# Patient Record
Sex: Male | Born: 1945
Health system: Southern US, Community
[De-identification: ages and names within clinical notes are randomized; demographics above are authoritative.]

## PROBLEM LIST (undated history)

## (undated) DIAGNOSIS — D649 Anemia, unspecified: Secondary | ICD-10-CM

## (undated) DIAGNOSIS — N183 Chronic kidney disease, stage 3 unspecified: Secondary | ICD-10-CM

## (undated) DIAGNOSIS — R972 Elevated prostate specific antigen [PSA]: Secondary | ICD-10-CM

## (undated) DIAGNOSIS — Z8619 Personal history of other infectious and parasitic diseases: Secondary | ICD-10-CM

## (undated) DIAGNOSIS — Z8639 Personal history of other endocrine, nutritional and metabolic disease: Secondary | ICD-10-CM

## (undated) DIAGNOSIS — M1990 Unspecified osteoarthritis, unspecified site: Secondary | ICD-10-CM

## (undated) DIAGNOSIS — E785 Hyperlipidemia, unspecified: Secondary | ICD-10-CM

## (undated) DIAGNOSIS — E119 Type 2 diabetes mellitus without complications: Secondary | ICD-10-CM

## (undated) DIAGNOSIS — R351 Nocturia: Secondary | ICD-10-CM

## (undated) DIAGNOSIS — G629 Polyneuropathy, unspecified: Secondary | ICD-10-CM

## (undated) DIAGNOSIS — H269 Unspecified cataract: Secondary | ICD-10-CM

## (undated) DIAGNOSIS — Z794 Long term (current) use of insulin: Secondary | ICD-10-CM

## (undated) DIAGNOSIS — I10 Essential (primary) hypertension: Secondary | ICD-10-CM

## (undated) DIAGNOSIS — C61 Malignant neoplasm of prostate: Secondary | ICD-10-CM

## (undated) HISTORY — DX: Anemia, unspecified: D64.9

## (undated) HISTORY — DX: Unspecified cataract: H26.9

## (undated) HISTORY — DX: Hyperlipidemia, unspecified: E78.5

---

## 1989-03-10 HISTORY — PX: CIRCUMCISION: SHX1350

## 1998-10-19 ENCOUNTER — Emergency Department (HOSPITAL_COMMUNITY): Admission: EM | Admit: 1998-10-19 | Discharge: 1998-10-19 | Payer: Self-pay | Admitting: Emergency Medicine

## 2011-03-24 ENCOUNTER — Emergency Department (HOSPITAL_COMMUNITY): Payer: Self-pay

## 2011-03-24 ENCOUNTER — Emergency Department (HOSPITAL_COMMUNITY)
Admission: EM | Admit: 2011-03-24 | Discharge: 2011-03-25 | Disposition: A | Discharge status: Home / Self Care | Payer: Self-pay | Attending: Emergency Medicine | Admitting: Emergency Medicine

## 2011-03-24 DIAGNOSIS — W19XXXA Unspecified fall, initial encounter: Secondary | ICD-10-CM | POA: Insufficient documentation

## 2011-03-24 DIAGNOSIS — Y92009 Unspecified place in unspecified non-institutional (private) residence as the place of occurrence of the external cause: Secondary | ICD-10-CM | POA: Insufficient documentation

## 2011-03-24 DIAGNOSIS — R51 Headache: Secondary | ICD-10-CM | POA: Insufficient documentation

## 2011-03-24 DIAGNOSIS — S0180XA Unspecified open wound of other part of head, initial encounter: Secondary | ICD-10-CM | POA: Insufficient documentation

## 2011-03-24 DIAGNOSIS — E119 Type 2 diabetes mellitus without complications: Secondary | ICD-10-CM | POA: Insufficient documentation

## 2011-03-24 DIAGNOSIS — F1010 Alcohol abuse, uncomplicated: Secondary | ICD-10-CM | POA: Insufficient documentation

## 2011-03-24 DIAGNOSIS — R4789 Other speech disturbances: Secondary | ICD-10-CM | POA: Insufficient documentation

## 2011-03-24 LAB — DIFFERENTIAL
Basophils Absolute: 0 10*3/uL (ref 0.0–0.1)
Eosinophils Relative: 20 % — ABNORMAL HIGH (ref 0–5)
Lymphocytes Relative: 30 % (ref 12–46)
Lymphs Abs: 1.3 10*3/uL (ref 0.7–4.0)
Monocytes Absolute: 0.3 10*3/uL (ref 0.1–1.0)

## 2011-03-24 LAB — COMPREHENSIVE METABOLIC PANEL
Albumin: 3.2 g/dL — ABNORMAL LOW (ref 3.5–5.2)
BUN: 10 mg/dL (ref 6–23)
Calcium: 9 mg/dL (ref 8.4–10.5)
Creatinine, Ser: 1.42 mg/dL — ABNORMAL HIGH (ref 0.50–1.35)
Total Protein: 8.1 g/dL (ref 6.0–8.3)

## 2011-03-24 LAB — CBC
HCT: 25.9 % — ABNORMAL LOW (ref 39.0–52.0)
MCH: 35.8 pg — ABNORMAL HIGH (ref 26.0–34.0)
MCHC: 36.7 g/dL — ABNORMAL HIGH (ref 30.0–36.0)
MCV: 97.7 fL (ref 78.0–100.0)
RDW: 13 % (ref 11.5–15.5)

## 2011-03-24 LAB — ETHANOL: Alcohol, Ethyl (B): 332 mg/dL — ABNORMAL HIGH (ref 0–11)

## 2011-03-29 ENCOUNTER — Emergency Department (HOSPITAL_COMMUNITY)
Admission: EM | Admit: 2011-03-29 | Discharge: 2011-03-29 | Disposition: A | Discharge status: Home / Self Care | Payer: Self-pay | Attending: Emergency Medicine | Admitting: Emergency Medicine

## 2011-03-29 DIAGNOSIS — Z87891 Personal history of nicotine dependence: Secondary | ICD-10-CM | POA: Insufficient documentation

## 2011-03-29 DIAGNOSIS — Z4802 Encounter for removal of sutures: Secondary | ICD-10-CM | POA: Insufficient documentation

## 2013-07-25 ENCOUNTER — Inpatient Hospital Stay (HOSPITAL_COMMUNITY)
Admission: EM | Admit: 2013-07-25 | Discharge: 2013-07-29 | DRG: 871 | Disposition: A | Discharge status: Skilled Nursing Facility | Payer: Medicare Other | Attending: Internal Medicine | Admitting: Internal Medicine

## 2013-07-25 ENCOUNTER — Emergency Department (HOSPITAL_COMMUNITY): Payer: Medicare Other

## 2013-07-25 ENCOUNTER — Encounter (HOSPITAL_COMMUNITY): Payer: Self-pay | Admitting: Emergency Medicine

## 2013-07-25 DIAGNOSIS — E162 Hypoglycemia, unspecified: Secondary | ICD-10-CM | POA: Diagnosis present

## 2013-07-25 DIAGNOSIS — E872 Acidosis, unspecified: Secondary | ICD-10-CM | POA: Diagnosis not present

## 2013-07-25 DIAGNOSIS — D61818 Other pancytopenia: Secondary | ICD-10-CM | POA: Clinically undetermined

## 2013-07-25 DIAGNOSIS — F10229 Alcohol dependence with intoxication, unspecified: Secondary | ICD-10-CM | POA: Diagnosis present

## 2013-07-25 DIAGNOSIS — T68XXXA Hypothermia, initial encounter: Secondary | ICD-10-CM | POA: Diagnosis not present

## 2013-07-25 DIAGNOSIS — E161 Other hypoglycemia: Secondary | ICD-10-CM | POA: Diagnosis not present

## 2013-07-25 DIAGNOSIS — R680 Hypothermia, not associated with low environmental temperature: Secondary | ICD-10-CM | POA: Diagnosis present

## 2013-07-25 DIAGNOSIS — N179 Acute kidney failure, unspecified: Secondary | ICD-10-CM | POA: Diagnosis not present

## 2013-07-25 DIAGNOSIS — Z79899 Other long term (current) drug therapy: Secondary | ICD-10-CM

## 2013-07-25 DIAGNOSIS — A419 Sepsis, unspecified organism: Secondary | ICD-10-CM | POA: Diagnosis not present

## 2013-07-25 DIAGNOSIS — I10 Essential (primary) hypertension: Secondary | ICD-10-CM | POA: Diagnosis present

## 2013-07-25 DIAGNOSIS — E1169 Type 2 diabetes mellitus with other specified complication: Secondary | ICD-10-CM | POA: Diagnosis present

## 2013-07-25 DIAGNOSIS — E860 Dehydration: Secondary | ICD-10-CM | POA: Diagnosis present

## 2013-07-25 DIAGNOSIS — F1010 Alcohol abuse, uncomplicated: Secondary | ICD-10-CM | POA: Diagnosis present

## 2013-07-25 DIAGNOSIS — E119 Type 2 diabetes mellitus without complications: Secondary | ICD-10-CM | POA: Diagnosis not present

## 2013-07-25 DIAGNOSIS — N189 Chronic kidney disease, unspecified: Secondary | ICD-10-CM | POA: Diagnosis present

## 2013-07-25 DIAGNOSIS — IMO0002 Reserved for concepts with insufficient information to code with codable children: Secondary | ICD-10-CM

## 2013-07-25 DIAGNOSIS — E440 Moderate protein-calorie malnutrition: Secondary | ICD-10-CM | POA: Diagnosis present

## 2013-07-25 DIAGNOSIS — R918 Other nonspecific abnormal finding of lung field: Secondary | ICD-10-CM | POA: Diagnosis not present

## 2013-07-25 DIAGNOSIS — I498 Other specified cardiac arrhythmias: Secondary | ICD-10-CM | POA: Diagnosis present

## 2013-07-25 DIAGNOSIS — G9340 Encephalopathy, unspecified: Secondary | ICD-10-CM | POA: Diagnosis not present

## 2013-07-25 DIAGNOSIS — R7301 Impaired fasting glucose: Secondary | ICD-10-CM | POA: Diagnosis not present

## 2013-07-25 DIAGNOSIS — R6520 Severe sepsis without septic shock: Secondary | ICD-10-CM

## 2013-07-25 DIAGNOSIS — F17200 Nicotine dependence, unspecified, uncomplicated: Secondary | ICD-10-CM | POA: Diagnosis present

## 2013-07-25 DIAGNOSIS — I129 Hypertensive chronic kidney disease with stage 1 through stage 4 chronic kidney disease, or unspecified chronic kidney disease: Secondary | ICD-10-CM | POA: Diagnosis present

## 2013-07-25 DIAGNOSIS — D638 Anemia in other chronic diseases classified elsewhere: Secondary | ICD-10-CM | POA: Diagnosis present

## 2013-07-25 LAB — URINALYSIS, ROUTINE W REFLEX MICROSCOPIC
Bilirubin Urine: NEGATIVE
Glucose, UA: NEGATIVE mg/dL
Ketones, ur: 15 mg/dL — AB
LEUKOCYTES UA: NEGATIVE
NITRITE: NEGATIVE
PH: 5 (ref 5.0–8.0)
Protein, ur: 100 mg/dL — AB
SPECIFIC GRAVITY, URINE: 1.011 (ref 1.005–1.030)
Urobilinogen, UA: 1 mg/dL (ref 0.0–1.0)

## 2013-07-25 LAB — COMPREHENSIVE METABOLIC PANEL
ALBUMIN: 3.3 g/dL — AB (ref 3.5–5.2)
ALT: 32 U/L (ref 0–53)
AST: 41 U/L — ABNORMAL HIGH (ref 0–37)
Alkaline Phosphatase: 47 U/L (ref 39–117)
BUN: 18 mg/dL (ref 6–23)
CALCIUM: 9.1 mg/dL (ref 8.4–10.5)
CO2: 9 mEq/L — CL (ref 19–32)
Chloride: 91 mEq/L — ABNORMAL LOW (ref 96–112)
Creatinine, Ser: 2.04 mg/dL — ABNORMAL HIGH (ref 0.50–1.35)
GFR calc non Af Amer: 33 mL/min — ABNORMAL LOW (ref >90)
GFR, EST AFRICAN AMERICAN: 38 mL/min — AB (ref >90)
GLUCOSE: 162 mg/dL — AB (ref 70–99)
Potassium: 4.5 mEq/L (ref 3.7–5.3)
SODIUM: 134 meq/L — AB (ref 137–147)
TOTAL PROTEIN: 8.3 g/dL (ref 6.0–8.3)
Total Bilirubin: 0.4 mg/dL (ref 0.3–1.2)

## 2013-07-25 LAB — BASIC METABOLIC PANEL
BUN: 17 mg/dL (ref 6–23)
CALCIUM: 8.1 mg/dL — AB (ref 8.4–10.5)
CO2: 8 mEq/L — CL (ref 19–32)
Chloride: 98 mEq/L (ref 96–112)
Creatinine, Ser: 1.66 mg/dL — ABNORMAL HIGH (ref 0.50–1.35)
GFR calc Af Amer: 48 mL/min — ABNORMAL LOW (ref >90)
GFR, EST NON AFRICAN AMERICAN: 42 mL/min — AB (ref >90)
GLUCOSE: 130 mg/dL — AB (ref 70–99)
Potassium: 5.1 mEq/L (ref 3.7–5.3)
Sodium: 134 mEq/L — ABNORMAL LOW (ref 137–147)

## 2013-07-25 LAB — CBC WITH DIFFERENTIAL/PLATELET
Basophils Absolute: 0 K/uL (ref 0.0–0.1)
Basophils Relative: 0 % (ref 0–1)
Eosinophils Absolute: 0 K/uL (ref 0.0–0.7)
Eosinophils Relative: 0 % (ref 0–5)
HCT: 35.5 % — ABNORMAL LOW (ref 39.0–52.0)
Hemoglobin: 12 g/dL — ABNORMAL LOW (ref 13.0–17.0)
Lymphocytes Relative: 13 % (ref 12–46)
Lymphs Abs: 1 K/uL (ref 0.7–4.0)
MCH: 35.7 pg — ABNORMAL HIGH (ref 26.0–34.0)
MCHC: 33.8 g/dL (ref 30.0–36.0)
MCV: 105.7 fL — ABNORMAL HIGH (ref 78.0–100.0)
Monocytes Absolute: 0.4 K/uL (ref 0.1–1.0)
Monocytes Relative: 5 % (ref 3–12)
Neutro Abs: 5.9 K/uL (ref 1.7–7.7)
Neutrophils Relative %: 81 % — ABNORMAL HIGH (ref 43–77)
Platelets: 174 K/uL (ref 150–400)
RBC: 3.36 MIL/uL — ABNORMAL LOW (ref 4.22–5.81)
RDW: 13.7 % (ref 11.5–15.5)
WBC: 7.3 K/uL (ref 4.0–10.5)

## 2013-07-25 LAB — PROTIME-INR
INR: 1.16 (ref 0.00–1.49)
Prothrombin Time: 14.6 seconds (ref 11.6–15.2)

## 2013-07-25 LAB — CBC
HEMATOCRIT: 30.4 % — AB (ref 39.0–52.0)
Hemoglobin: 10.2 g/dL — ABNORMAL LOW (ref 13.0–17.0)
MCH: 35.3 pg — ABNORMAL HIGH (ref 26.0–34.0)
MCHC: 33.6 g/dL (ref 30.0–36.0)
MCV: 105.2 fL — AB (ref 78.0–100.0)
PLATELETS: 141 10*3/uL — AB (ref 150–400)
RBC: 2.89 MIL/uL — ABNORMAL LOW (ref 4.22–5.81)
RDW: 13.3 % (ref 11.5–15.5)
WBC: 5.2 10*3/uL (ref 4.0–10.5)

## 2013-07-25 LAB — GLUCOSE, CAPILLARY
GLUCOSE-CAPILLARY: 155 mg/dL — AB (ref 70–99)
GLUCOSE-CAPILLARY: 148 mg/dL — AB (ref 70–99)
GLUCOSE-CAPILLARY: 145 mg/dL — AB (ref 70–99)
Glucose-Capillary: 129 mg/dL — ABNORMAL HIGH (ref 70–99)

## 2013-07-25 LAB — URINE MICROSCOPIC-ADD ON

## 2013-07-25 LAB — TROPONIN I: Troponin I: <0.3 ng/mL (ref <0.30)

## 2013-07-25 LAB — MRSA PCR SCREENING: MRSA by PCR: NEGATIVE

## 2013-07-25 LAB — MAGNESIUM: Magnesium: 1.9 mg/dL (ref 1.5–2.5)

## 2013-07-25 LAB — APTT: aPTT: 33 seconds (ref 24–37)

## 2013-07-25 LAB — SODIUM, URINE, RANDOM: Sodium, Ur: 101 mEq/L

## 2013-07-25 LAB — CK: Total CK: 68 U/L (ref 7–232)

## 2013-07-25 LAB — ETHANOL: Alcohol, Ethyl (B): 114 mg/dL — ABNORMAL HIGH (ref 0–11)

## 2013-07-25 LAB — CG4 I-STAT (LACTIC ACID)
LACTIC ACID, VENOUS: 9.02 mmol/L — AB (ref 0.5–2.2)
Lactic Acid, Venous: 7.95 mmol/L — ABNORMAL HIGH (ref 0.5–2.2)

## 2013-07-25 LAB — LACTIC ACID, PLASMA: Lactic Acid, Venous: 6 mmol/L — ABNORMAL HIGH (ref 0.5–2.2)

## 2013-07-25 LAB — CREATININE, URINE, RANDOM: Creatinine, Urine: 39.31 mg/dL

## 2013-07-25 LAB — PROCALCITONIN

## 2013-07-25 MED ORDER — LORAZEPAM 2 MG/ML IJ SOLN: 1.0000 mg | Freq: Four times a day (QID) | INTRAMUSCULAR | Status: AC | PRN

## 2013-07-25 MED ORDER — NICOTINE 21 MG/24HR TD PT24
21.0000 mg | MEDICATED_PATCH | Freq: Every day | TRANSDERMAL | Status: DC
  Administered 2013-07-25 – 2013-07-29 (×5): 21 mg via TRANSDERMAL
  Filled 2013-07-25 (×5): qty 1

## 2013-07-25 MED ORDER — LORAZEPAM 1 MG PO TABS
0.0000 mg | ORAL_TABLET | Freq: Four times a day (QID) | ORAL | Status: AC
  Administered 2013-07-25 – 2013-07-27 (×6): 1 mg via ORAL
  Filled 2013-07-25 (×6): qty 1

## 2013-07-25 MED ORDER — ONDANSETRON HCL 4 MG/2ML IJ SOLN: 4.0000 mg | Freq: Four times a day (QID) | INTRAMUSCULAR | Status: DC | PRN

## 2013-07-25 MED ORDER — ONDANSETRON HCL 4 MG PO TABS: 4.0000 mg | ORAL_TABLET | Freq: Four times a day (QID) | ORAL | Status: DC | PRN

## 2013-07-25 MED ORDER — THIAMINE HCL 100 MG/ML IJ SOLN
100.0000 mg | Freq: Every day | INTRAMUSCULAR | Status: DC
  Filled 2013-07-25 (×4): qty 1

## 2013-07-25 MED ORDER — INSULIN ASPART 100 UNIT/ML ~~LOC~~ SOLN
0.0000 [IU] | Freq: Three times a day (TID) | SUBCUTANEOUS | Status: DC
  Administered 2013-07-26: 1 [IU] via SUBCUTANEOUS

## 2013-07-25 MED ORDER — LORAZEPAM 1 MG PO TABS: 1.0000 mg | ORAL_TABLET | Freq: Four times a day (QID) | ORAL | Status: AC | PRN

## 2013-07-25 MED ORDER — PIPERACILLIN-TAZOBACTAM 3.375 G IVPB
3.3750 g | Freq: Three times a day (TID) | INTRAVENOUS | Status: DC
  Administered 2013-07-25 – 2013-07-28 (×8): 3.375 g via INTRAVENOUS
  Filled 2013-07-25 (×13): qty 50

## 2013-07-25 MED ORDER — SODIUM BICARBONATE 8.4 % IV SOLN
INTRAVENOUS | Status: DC
  Administered 2013-07-25 – 2013-07-26 (×2): via INTRAVENOUS
  Filled 2013-07-25 (×5): qty 1000

## 2013-07-25 MED ORDER — FOLIC ACID 1 MG PO TABS
1.0000 mg | ORAL_TABLET | Freq: Every day | ORAL | Status: DC
  Administered 2013-07-25 – 2013-07-29 (×5): 1 mg via ORAL
  Filled 2013-07-25 (×6): qty 1

## 2013-07-25 MED ORDER — SODIUM CHLORIDE 0.9 % IV BOLUS (SEPSIS)
1000.0000 mL | Freq: Once | INTRAVENOUS | Status: AC
  Administered 2013-07-25: 1000 mL via INTRAVENOUS

## 2013-07-25 MED ORDER — ADULT MULTIVITAMIN W/MINERALS CH
1.0000 | ORAL_TABLET | Freq: Every day | ORAL | Status: DC
  Administered 2013-07-25 – 2013-07-29 (×5): 1 via ORAL
  Filled 2013-07-25 (×5): qty 1

## 2013-07-25 MED ORDER — ALUM & MAG HYDROXIDE-SIMETH 200-200-20 MG/5ML PO SUSP: 30.0000 mL | Freq: Four times a day (QID) | ORAL | Status: DC | PRN

## 2013-07-25 MED ORDER — VANCOMYCIN HCL IN DEXTROSE 1-5 GM/200ML-% IV SOLN
1000.0000 mg | Freq: Once | INTRAVENOUS | Status: AC
  Administered 2013-07-25: 1000 mg via INTRAVENOUS
  Filled 2013-07-25: qty 200

## 2013-07-25 MED ORDER — SORBITOL 70 % SOLN
30.0000 mL | Freq: Every day | Status: DC | PRN
  Filled 2013-07-25: qty 30

## 2013-07-25 MED ORDER — SODIUM CHLORIDE 0.9 % IJ SOLN
3.0000 mL | Freq: Two times a day (BID) | INTRAMUSCULAR | Status: DC
  Administered 2013-07-25 – 2013-07-29 (×5): 3 mL via INTRAVENOUS

## 2013-07-25 MED ORDER — VITAMIN B-1 100 MG PO TABS
100.0000 mg | ORAL_TABLET | Freq: Every day | ORAL | Status: DC
  Administered 2013-07-25 – 2013-07-29 (×5): 100 mg via ORAL
  Filled 2013-07-25 (×5): qty 1

## 2013-07-25 MED ORDER — MAGNESIUM CITRATE PO SOLN
1.0000 | Freq: Once | ORAL | Status: AC | PRN
  Filled 2013-07-25: qty 296

## 2013-07-25 MED ORDER — IPRATROPIUM BROMIDE 0.02 % IN SOLN: 0.5000 mg | RESPIRATORY_TRACT | Status: DC | PRN

## 2013-07-25 MED ORDER — LORAZEPAM 1 MG PO TABS: 0.0000 mg | ORAL_TABLET | Freq: Two times a day (BID) | ORAL | Status: DC

## 2013-07-25 MED ORDER — PIPERACILLIN-TAZOBACTAM 3.375 G IVPB 30 MIN
3.3750 g | Freq: Once | INTRAVENOUS | Status: AC
  Administered 2013-07-25: 3.375 g via INTRAVENOUS
  Filled 2013-07-25: qty 50

## 2013-07-25 MED ORDER — ALBUTEROL SULFATE (2.5 MG/3ML) 0.083% IN NEBU: 2.5000 mg | INHALATION_SOLUTION | RESPIRATORY_TRACT | Status: DC | PRN

## 2013-07-25 MED ORDER — ACETAMINOPHEN 325 MG PO TABS
650.0000 mg | ORAL_TABLET | Freq: Four times a day (QID) | ORAL | Status: DC | PRN
  Administered 2013-07-25: 650 mg via ORAL
  Filled 2013-07-25: qty 2

## 2013-07-25 MED ORDER — ONDANSETRON HCL 4 MG/2ML IJ SOLN
4.0000 mg | Freq: Once | INTRAMUSCULAR | Status: AC
Start: 2013-07-25 — End: 2013-07-25
  Administered 2013-07-25: 4 mg via INTRAVENOUS

## 2013-07-25 MED ORDER — ONDANSETRON HCL 4 MG/2ML IJ SOLN: 4.0000 mg | Freq: Once | INTRAMUSCULAR | Status: DC

## 2013-07-25 MED ORDER — POLYETHYLENE GLYCOL 3350 17 G PO PACK
17.0000 g | PACK | Freq: Every day | ORAL | Status: DC | PRN
  Filled 2013-07-25: qty 1

## 2013-07-25 MED ORDER — ONDANSETRON HCL 4 MG/2ML IJ SOLN
INTRAMUSCULAR | Status: AC
  Filled 2013-07-25: qty 2

## 2013-07-25 MED ORDER — SODIUM CHLORIDE 0.9 % IV SOLN
INTRAVENOUS | Status: DC
  Administered 2013-07-26: 10:00:00 via INTRAVENOUS

## 2013-07-25 MED ORDER — SODIUM CHLORIDE 0.9 % IV BOLUS (SEPSIS)
1000.0000 mL | Freq: Once | INTRAVENOUS | Status: AC
Start: 2013-07-25 — End: 2013-07-25
  Administered 2013-07-25: 1000 mL via INTRAVENOUS

## 2013-07-25 MED ORDER — SODIUM CHLORIDE 0.9 % IV SOLN
INTRAVENOUS | Status: AC
  Administered 2013-07-25: 16:00:00 via INTRAVENOUS
  Administered 2013-07-25: 150 mL via INTRAVENOUS

## 2013-07-25 MED ORDER — ENOXAPARIN SODIUM 40 MG/0.4ML ~~LOC~~ SOLN
40.0000 mg | Freq: Every day | SUBCUTANEOUS | Status: DC
  Administered 2013-07-25: 40 mg via SUBCUTANEOUS
  Filled 2013-07-25 (×2): qty 0.4

## 2013-07-25 MED ORDER — ACETAMINOPHEN 650 MG RE SUPP
650.0000 mg | Freq: Four times a day (QID) | RECTAL | Status: DC | PRN
Start: 2013-07-25 — End: 2013-07-29

## 2013-07-25 MED ORDER — PANTOPRAZOLE SODIUM 40 MG PO TBEC
40.0000 mg | DELAYED_RELEASE_TABLET | Freq: Every day | ORAL | Status: DC
Start: 2013-07-26 — End: 2013-07-29
  Administered 2013-07-26 – 2013-07-29 (×4): 40 mg via ORAL
  Filled 2013-07-25 (×4): qty 1

## 2013-07-25 MED ORDER — VANCOMYCIN HCL IN DEXTROSE 1-5 GM/200ML-% IV SOLN
1000.0000 mg | INTRAVENOUS | Status: DC
  Administered 2013-07-26: 1000 mg via INTRAVENOUS
  Filled 2013-07-25 (×2): qty 200

## 2013-07-25 MED ORDER — SODIUM CHLORIDE 0.9 % IV BOLUS (SEPSIS): 1000.0000 mL | Freq: Once | INTRAVENOUS | Status: DC

## 2013-07-25 NOTE — ED Notes (Signed)
Attempted report 

## 2013-07-25 NOTE — ED Notes (Signed)
Dr. Regenia Skeeter made aware pt'd family at bedside. Dr. Regenia Skeeter also declines ordering ABX at this times, states wants repeat CG4 I-stat Lactic Acid s/p 2058mL NS.

## 2013-07-25 NOTE — ED Notes (Signed)
Patient unable to void 

## 2013-07-25 NOTE — ED Notes (Signed)
Dr. Thompson at bedside. 

## 2013-07-25 NOTE — ED Notes (Signed)
Attempted to do in and out cath but cath would not advance. Readjusted and cath will still not advance. MD and RN made aware.

## 2013-07-25 NOTE — ED Notes (Signed)
Level II Code Sepsis called.

## 2013-07-25 NOTE — ED Notes (Signed)
Dr. Regenia Skeeter alerted to pt temperature and at bedside at this time.

## 2013-07-25 NOTE — Progress Notes (Signed)
Patient received to room 3's bed 12 from ED. Patient is alert and oriented x's. Patient slide self over into our bed. Vitals obtained, central telemetry and E-link made aware of the patient's arrival. Patient instructed as to call out for assistance and not to get out of the bed by himself.

## 2013-07-25 NOTE — ED Notes (Signed)
Phlebotomy at bedside.

## 2013-07-25 NOTE — ED Notes (Signed)
Family at bedside. Dema Severin [phone] or [phone].

## 2013-07-25 NOTE — H&P (Signed)
[facility] History and Physical  Searcy Miyoshi Deprey VEL:[id number] DOB: [birth year] DOA: 07/25/2013  Referring physician: Dr. Regenia Skeeter PCP: No PCP Per Patient   Chief Complaint: Unresponsiveness  HPI: Robert Sparks is a 68 y.o. male  With history of tobacco abuse, prior history of diabetes per patient has been off medications for several years, history of alcohol abuse presented to the ED unresponsive. Patient is not quite sure what happened however states that was drinking the night prior to admission he drank about 2-340 ounce beers and drank close to half a pint of vodka mixed with water. Patient stated that his house mate found him unresponsive and subsequently EMS was called. Per ED report when EMS got that patient's CBGs were 20 was given some D50 and glucagon and his mental status improved and return back to his baseline. Patient denies any fevers, no nausea, no vomiting, no abdominal pain, no chest pain, no shortness of breath, no dysuria, no diarrhea, no cough. Patient does endorse some chills, generalized weakness, occasional constipation. Patient was seen in the emergency room chest x-ray which was done was unremarkable. Vitals on admission patient was noted to be hypothermic with a temperature of 91. Comprehensive metabolic profile obtained at a sodium of 134 chloride of 91 bicarbonate of 9 creatinine of 2.04 glucose of 162 albumin of 3.3 AST of 41 otherwise was within normal limits. Initial lactic acid level was 9.0 to and then 7.95. Total CK was 68. CBC had a hemoglobin of 12.0 otherwise was within normal limits. Urinalysis which was done was cloudy nitrite negative leukocytes negative. Alcohol level was 114. Patient was given 3 L of normal saline. Blood cultures were obtained. Patient was placed on IV vancomycin IV Zosyn. We were called to admit the patient for further evaluation and management.   Review of Systems: As per history of present illness otherwise  negative. Constitutional:  No weight loss, night sweats, Fevers, chills, fatigue.  HEENT:  No headaches, Difficulty swallowing,Tooth/dental problems,Sore throat,  No sneezing, itching, ear ache, nasal congestion, post nasal drip,  Cardio-vascular:  No chest pain, Orthopnea, PND, swelling in lower extremities, anasarca, dizziness, palpitations  GI:  No heartburn, indigestion, abdominal pain, nausea, vomiting, diarrhea, change in bowel habits, loss of appetite  Resp:  No shortness of breath with exertion or at rest. No excess mucus, no productive cough, No non-productive cough, No coughing up of blood.No change in color of mucus.No wheezing.No chest wall deformity  Skin:  no rash or lesions.  GU:  no dysuria, change in color of urine, no urgency or frequency. No flank pain.  Musculoskeletal:  No joint pain or swelling. No decreased range of motion. No back pain.  Psych:  No change in mood or affect. No depression or anxiety. No memory loss.   Past Medical History  Diagnosis Date  . Diabetes mellitus without complication   . ETOH abuse    History reviewed. No pertinent past surgical history. Social History:  reports that he has been smoking Cigarettes.  He has been smoking about 0.50 packs per day. He does not have any smokeless tobacco history on file. He reports that he drinks alcohol. His drug history is not on file.  No Known Allergies  History reviewed. No pertinent family history.   Prior to Admission medications   Not on File   Physical Exam: Filed Vitals:   07/25/13 1545  BP: 145/76  Pulse: 104  Temp:   Resp: 26    BP 145/76  Pulse 104  Temp(Src) 95.9 F (35.5 C) (Rectal)  Resp 26  Ht 5\' 7"  (1.702 m)  Wt 68.04 kg (150 lb)  BMI 23.49 kg/m2  SpO2 100%  General:  Appears calm and comfortable. In no acute cardiopulmonary distress. Eyes: Extraocular movements intact. Muddy sclera. PERRLA, normal lids, irises & conjunctiva ENT: grossly normal hearing, lips &  tongue. Poor dentition. Neck: no LAD, masses or thyromegaly Cardiovascular: Tachycardic, no m/r/g. No LE edema. Telemetry: Sinus tachycardia Respiratory: CTA bilaterally, no w/r/r. Normal respiratory effort. Abdomen: soft, ntnd, positive bowel sounds. Skin: no rash or induration seen on limited exam Musculoskeletal: grossly normal tone BUE/BLE Psychiatric: grossly normal mood and affect, speech fluent and appropriate Neurologic: Alert and oriented x3. Cranial nerves II through XII are grossly intact. No focal deficits. Sensation is intact. Visual fields are intact.          Labs on Admission:  Basic Metabolic Panel:  Recent Labs Lab 07/25/13 1248  NA 134*  K 4.5  CL 91*  CO2 9*  GLUCOSE 162*  BUN 18  CREATININE 2.04*  CALCIUM 9.1   Liver Function Tests:  Recent Labs Lab 07/25/13 1248  AST 41*  ALT 32  ALKPHOS 47  BILITOT 0.4  PROT 8.3  ALBUMIN 3.3*   No results found for this basename: LIPASE, AMYLASE,  in the last 168 hours No results found for this basename: AMMONIA,  in the last 168 hours CBC:  Recent Labs Lab 07/25/13 1248  WBC 7.3  NEUTROABS 5.9  HGB 12.0*  HCT 35.5*  MCV 105.7*  PLT 174   Cardiac Enzymes:  Recent Labs Lab 07/25/13 1524  CKTOTAL 68    BNP (last 3 results) No results found for this basename: PROBNP,  in the last 8760 hours CBG:  Recent Labs Lab 07/25/13 1237 07/25/13 1352 07/25/13 1541  GLUCAP 155* 148* 145*    Radiological Exams on Admission: Dg Chest Portable 1 View  07/25/2013   CLINICAL DATA:  Hypoglycemia  EXAM: PORTABLE CHEST - 1 VIEW  COMPARISON:  None.  FINDINGS: The heart size and mediastinal contours are within normal limits. Both lungs are clear. The visualized skeletal structures are unremarkable.  IMPRESSION: No active disease.   Electronically Signed   By: Franchot Gallo M.D.   On: 07/25/2013 13:29    EKG: Independently reviewed. NSR  Assessment/Plan Principal Problem:   Sepsis Active Problems:    Hypoglycemia   Acute encephalopathy   Hypothermia   ETOH abuse   Metabolic acidosis   ARF (acute renal failure)  #1 sepsis Questionable etiology. Patient noted to be hypothermic on admission with elevated lactic acid level of 9.0 to. Normal white count. The sinus tachycardia. An increased respiratory rate. Bicarbonate level noted to be at 9. Admit the patient to step down unit. We'll place on a bear hugger. Check blood cultures x2. Check a pro calcitonin level. Urinalysis is negative. Patient with no respiratory symptoms. We'll place empirically on IV vancomycin and IV Zosyn. We'll consult critical care medicine for further evaluation and management.  #2 hypothermia Likely secondary to hypoglycemia versus an infectious etiology. Patient noted to have a CBG of 20 when EMS arrived. Patient was given D50 and glucagon with improvement in his CBGs. Patient has been pancultured. Lactic acid level is elevated. Check a pro calcitonin level. Continue bear hugger. Placed empirically on IV vancomycin and IV Zosyn. Follow.  #3 hypoglycemia Questionable etiology. May be secondary to poor oral intake of alcohol use. Patient states she's not on any diabetic medications  and hasn't been on any for several years. Will check her and cortisol level. Panculture. Check CBGs every 4 hours. Place on IV fluids. Placed empirically on IV vancomycin IV Zosyn. Follow.  #4 acute renal failure Likely secondary to prerenal azotemia. Will check a urine sodium. Check a urine creatinine. Check an abdominal ultrasound. Hydrate with IV fluids. Follow. If no significant improvement in patient's renal function will likely need a nephrology consultation.  #5 metabolic acidosis Questionable etiology. Likely secondary to sepsis secondary to an infectious etiology. Lactic acid level is elevated. Check a pro calcitonin level. Panculture the patient. Will add sodium bicarbonate to IV fluids. Placed empirically on IV vancomycin IV Zosyn.  Follow.  #6 history of alcohol abuse Place on Ativan CIWA protocol. Alcohol cessation. Place on thiamine, folic acid, multivitamin.  #7 tobacco abuse Tobacco cessation. Place on a nicotine patch.  #8 dehydration IV fluids.  #9 history of diabetes Check a hemoglobin A1c. Check CBGs every 4 hours. Place on a Carb modified diet. Place on a sensitive sliding scale insulin.  #10 acute encephalopathy Likely secondary to hypoglycemic episode. Patient has improved and back to his baseline. Patient has been pancultured. Patient with no focal neurological deficits. Placed empirically on IV vancomycin and IV Zosyn.  #11 prophylaxis PPI for GI prophylaxis. Lovenox for DVT prophylaxis.  Code Status: Full Family Communication: Updated patient no family present. Disposition Plan: Admit to step down unit.  Time spent: 70 mins  [facility] Pager [phone]

## 2013-07-25 NOTE — ED Provider Notes (Signed)
CSN: [id number]     Arrival date & time 07/25/13  1231 History   First MD Initiated Contact with Patient 07/25/13 1238     Chief Complaint  Patient presents with  . Hypoglycemia   (Consider location/radiation/quality/duration/timing/severity/associated sxs/prior Treatment) HPI Comments: 68 year old male presents by EMS having been found unresponsive by family. This occurred about an hour ago. He was last seen normal last night about 7 PM. The rest of his glucose to be 20 he is given D50 and glucagon. His mental status returned to normal after this. The patient states he drank last night but states he drank a few beers and did not seem to indicate that it was a lot. He denies any fevers, chills, shortness of breath, or urinary symptoms. No abdominal pain. He has had a little bit of a cough. He is not on any medications for diabetes but doesn't nausea he has diabetes.   Past Medical History  Diagnosis Date  . Diabetes mellitus without complication   . ETOH abuse    History reviewed. No pertinent past surgical history. No family history on file. History  Substance Use Topics  . Smoking status: Not on file  . Smokeless tobacco: Not on file  . Alcohol Use: Yes    Review of Systems  Constitutional: Negative for fever and chills.  Respiratory: Positive for cough. Negative for shortness of breath.   Cardiovascular: Negative for chest pain.  Gastrointestinal: Negative for nausea, vomiting, abdominal pain and diarrhea.  Genitourinary: Negative for dysuria.  Musculoskeletal: Negative for neck stiffness.  Neurological: Negative for headaches.  Psychiatric/Behavioral: Positive for confusion.  All other systems reviewed and are negative.    Allergies  Review of patient's allergies indicates not on file.  Home Medications  No current outpatient prescriptions on file. BP 146/78  Pulse 81  Temp(Src) 91 F (32.8 C) (Rectal)  Resp 18  SpO2 100% Physical Exam  Nursing note and vitals  reviewed. Constitutional: He is oriented to person, place, and time. He appears well-developed and well-nourished. No distress.  HENT:  Head: Normocephalic and atraumatic.  Right Ear: External ear normal.  Left Ear: External ear normal.  Nose: Nose normal.  Eyes: EOM are normal. Pupils are equal, round, and reactive to light. Right eye exhibits no discharge. Left eye exhibits no discharge.  Neck: Normal range of motion. Neck supple.  Cardiovascular: Normal rate, regular rhythm, normal heart sounds and intact distal pulses.   Pulmonary/Chest: Effort normal and breath sounds normal.  Abdominal: Soft. He exhibits no distension. There is no tenderness.  Musculoskeletal: He exhibits no edema.  Neurological: He is alert and oriented to person, place, and time.  Knows it is January 2015 but is slightly off on the date and the day of the week (thursday instead of Friday). 5/5 strength in all 4 extremities. CN 2-12 grossly intact.  Skin: Skin is warm and dry.    ED Course  Procedures (including critical care time) Labs Review Labs Reviewed  CBC WITH DIFFERENTIAL - Abnormal; Notable for the following:    RBC 3.36 (*)    Hemoglobin 12.0 (*)    HCT 35.5 (*)    MCV 105.7 (*)    MCH 35.7 (*)    Neutrophils Relative % 81 (*)    All other components within normal limits  COMPREHENSIVE METABOLIC PANEL - Abnormal; Notable for the following:    Sodium 134 (*)    Chloride 91 (*)    CO2 9 (*)    Glucose, Bld 162 (*)  Creatinine, Ser 2.04 (*)    Albumin 3.3 (*)    AST 41 (*)    GFR calc non Af Amer 33 (*)    GFR calc Af Amer 38 (*)    All other components within normal limits  ETHANOL - Abnormal; Notable for the following:    Alcohol, Ethyl (B) 114 (*)    All other components within normal limits  GLUCOSE, CAPILLARY - Abnormal; Notable for the following:    Glucose-Capillary 155 (*)    All other components within normal limits  GLUCOSE, CAPILLARY - Abnormal; Notable for the following:     Glucose-Capillary 148 (*)    All other components within normal limits  CG4 I-STAT (LACTIC ACID) - Abnormal; Notable for the following:    Lactic Acid, Venous 9.02 (*)    All other components within normal limits  CG4 I-STAT (LACTIC ACID) - Abnormal; Notable for the following:    Lactic Acid, Venous 7.95 (*)    All other components within normal limits  CULTURE, BLOOD (ROUTINE X 2)  CULTURE, BLOOD (ROUTINE X 2)  URINALYSIS, ROUTINE W REFLEX MICROSCOPIC   Imaging Review Dg Chest Portable 1 View  07/25/2013   CLINICAL DATA:  Hypoglycemia  EXAM: PORTABLE CHEST - 1 VIEW  COMPARISON:  None.  FINDINGS: The heart size and mediastinal contours are within normal limits. Both lungs are clear. The visualized skeletal structures are unremarkable.  IMPRESSION: No active disease.   Electronically Signed   By: Franchot Gallo M.D.   On: 07/25/2013 13:29    EKG Interpretation   None       MDM   1. Hypoglycemia   2. Lactic acidosis   3. Hypothermia    Given his hypothermia and elevated lactic acid, a code sepsis was called. Patient has no obvious source of infection. No headache, current AMS or neck pain to suggest CNS infection. Warmed up with bahr hugger, and given warm fluids. No more hypoglycemia here. Given he did not take any meds, will assume sepsis and cover broadly. Will admit to stepdown. He otherwise appears well and has normal vitals besides temperature.    Ephraim Hamburger, MD 07/25/13 [phone]

## 2013-07-25 NOTE — ED Notes (Addendum)
Pt presents from home via [facility] with c/o hypoglycemia. Pt was found unresponsive by family this morning at 1130 (LSN 1900 last night).  EMS found CBG to be 20. Pt was given 1mg  of glucagon and an amp of D50. Pt is now alert and oriented x4 in NAD. Pt reports hx diabetes and ETOH abuse

## 2013-07-25 NOTE — ED Notes (Addendum)
Critical Low CO2: 9.0 Dr. Regenia Skeeter made aware.

## 2013-07-25 NOTE — Progress Notes (Signed)
ANTIBIOTIC CONSULT NOTE - INITIAL  Pharmacy Consult for vancomycin and zosyn Indication: rule out sepsis  No Known Allergies  Patient Measurements: Height: 5\' 7"  (170.2 cm) Weight: 150 lb (68.04 kg) (estimate) IBW/kg (Calculated) : 66.1   Vital Signs: Temp: 95.9 F (35.5 C) (01/16 1543) Temp src: Rectal (01/16 1543) BP: 148/82 mmHg (01/16 1645) Pulse Rate: 105 (01/16 1645) Intake/Output from previous day:   Intake/Output from this shift: Total I/O In: -  Out: 1000 [Urine:700; Emesis/NG output:300]  Labs:  Recent Labs  07/25/13 1248  WBC 7.3  HGB 12.0*  PLT 174  CREATININE 2.04*   Estimated Creatinine Clearance: 33.8 ml/min (by C-G formula based on Cr of 2.04). No results found for this basename: VANCOTROUGH, VANCOPEAK, VANCORANDOM, GENTTROUGH, GENTPEAK, GENTRANDOM, TOBRATROUGH, TOBRAPEAK, TOBRARND, AMIKACINPEAK, AMIKACINTROU, AMIKACIN,  in the last 72 hours   Microbiology: No results found for this or any previous visit (from the past 720 hour(s)).  Medical History: Past Medical History  Diagnosis Date  . Diabetes mellitus without complication   . ETOH abuse    Assessment: 68 year old male found unresponsive by EMS today with CBG of 20, patient states he does not know what happened and endorsed fairly heavy drinking the night before. Patient currently hypothermic with elevated lactic acid, pct undetectable, wbc count normal. He has been started on vancomycin and zosyn for possible sepsis.   Goal of Therapy:  Vancomycin trough level 15-20 mcg/ml  Plan:  Measure antibiotic drug levels at steady state Follow up culture results Vancomycin 1g IV q 24 hours Zosyn 3.375g IV q8 hours - infuse each dose over 4 hours  Erin Hearing PharmD., BCPS Clinical Pharmacist Pager [phone] 07/25/2013 5:09 PM

## 2013-07-25 NOTE — ED Notes (Signed)
Attempted to call pt's emergency contact x2, no answer.

## 2013-07-25 NOTE — ED Notes (Signed)
Patient given [place] sandwich, applesauce, and orange juice per Dr. Tyron Russell request.

## 2013-07-25 NOTE — ED Notes (Signed)
LEVEL II CODE SEPSIS CALLED

## 2013-07-25 NOTE — ED Notes (Signed)
Bear Hugger warmer applied to patient.

## 2013-07-25 NOTE — Consult Note (Addendum)
PULMONARY/CRITICAL CARE MEDICINE  Name: Robert Sparks MRN: [id number] DOB: Jun 18, 1946    ADMISSION DATE:  07/25/2013 CONSULTATION DATE:  07/25/13  REFERRING MD :  Grandville Silos PRIMARY SERVICE: [facility]  CHIEF COMPLAINT:  Unresponsive  BRIEF PATIENT DESCRIPTION: Robert Sparks is a 68 yo male with PMH of DM, ETOH abuse who was found at home unresponsive and hypoglycemic by EMS.  SIGNIFICANT EVENTS / STUDIES:    LINES / TUBES: Right PIV 1/16 >>>  CULTURES: Urine 1/16 >>> Blood 1/16 >>>  ANTIBIOTICS: Vancomycin 1/16 >>> Zosyn 1/16 >>>>  HISTORY OF PRESENT ILLNESS:  Robert Sparks is a 68 year old male with history of DM and ETOH abuse.  He reports he was at home today when he blacked out, his roommate called EMS and he was found to be unresponsive with a blood glucose of 20.  In the ED he was found to be hypothermic and with a lactic acidosis. He was resuscitated with IVF and empiric antibiotics were started.  PCCM was consulted to assist with management.  PAST MEDICAL HISTORY :  Past Medical History  Diagnosis Date  . Diabetes mellitus without complication   . ETOH abuse    History reviewed. No pertinent past surgical history. Prior to Admission medications   Not on File   No Known Allergies  FAMILY HISTORY:  History reviewed. No pertinent family history. SOCIAL HISTORY:  reports that he has been smoking Cigarettes.  He has been smoking about 0.50 packs per day. He does not have any smokeless tobacco history on file. He reports that he drinks alcohol. His drug history is not on file.  REVIEW OF SYSTEMS:   Review of Systems  Constitutional: Negative for fever, chills, weight loss and malaise/fatigue.  HENT: Negative for congestion.   Eyes: Negative for blurred vision and photophobia.  Respiratory: Positive for cough (mild chronic). Negative for hemoptysis, sputum production and shortness of breath.   Cardiovascular: Negative for chest pain and leg swelling.   Gastrointestinal: Positive for nausea (once after arrival to ED). Negative for heartburn, abdominal pain, diarrhea and constipation.  Genitourinary: Negative for dysuria, frequency and flank pain.  Musculoskeletal: Negative for neck pain.  Skin: Negative for rash.  Neurological: Positive for loss of consciousness and headaches. Negative for dizziness, sensory change and seizures.  Psychiatric/Behavioral: Positive for substance abuse. The patient is not nervous/anxious.      SUBJECTIVE: Reports he feels "OK," is concerned that he will have to start insulin.  VITAL SIGNS: Temp:  [91 F (32.8 C)-98.4 F (36.9 C)] 98.4 F (36.9 C) (01/16 1700) Pulse Rate:  [81-109] 109 (01/16 1700) Resp:  [17-34] 24 (01/16 1700) BP: (140-159)/(74-93) 159/93 mmHg (01/16 1700) SpO2:  [98 %-100 %] 100 % (01/16 1700) Weight:  [150 lb (68.04 kg)] 150 lb (68.04 kg) (01/16 1700) HEMODYNAMICS:   VENTILATOR SETTINGS:   INTAKE / OUTPUT: Intake/Output     01/15 0701 - 01/16 0700 01/16 0701 - 01/17 0700   Urine (mL/kg/hr)  700   Emesis/NG output  300   Total Output   1000   Net   -1000          PHYSICAL EXAMINATION: General:  Resting in bed, NAD Neuro:  AAOx4, movement of all extremities HEENT:  [place]/AT PERRLA Cardiovascular:  Tachycardic, no appreciated murmur Lungs:  CTA b/l Abdomen:  Normoactive BS, Non distended, non tender. Musculoskeletal:  No pedal edema Skin:  Warm/ Dry  LABS:  CBC  Recent Labs Lab 07/25/13 1248  WBC 7.3  HGB 12.0*  HCT 35.5*  PLT 174   Coag's No results found for this basename: APTT, INR,  in the last 168 hours BMET  Recent Labs Lab 07/25/13 1248  NA 134*  K 4.5  CL 91*  CO2 9*  BUN 18  CREATININE 2.04*  GLUCOSE 162*   Electrolytes  Recent Labs Lab 07/25/13 1248  CALCIUM 9.1   Sepsis Markers  Recent Labs Lab 07/25/13 1300 07/25/13 1424 07/25/13 1524  LATICACIDVEN 9.02* 7.95*  --   PROCALCITON  --   --  <0.10   ABG No results found for  this basename: PHART, PCO2ART, PO2ART,  in the last 168 hours Liver Enzymes  Recent Labs Lab 07/25/13 1248  AST 41*  ALT 32  ALKPHOS 47  BILITOT 0.4  ALBUMIN 3.3*   Cardiac Enzymes No results found for this basename: TROPONINI, PROBNP,  in the last 168 hours Glucose  Recent Labs Lab 07/25/13 1237 07/25/13 1352 07/25/13 1541  GLUCAP 155* 148* 145*    CXR: 1/16 >>> No pulmonary infiltrate appreciated.  ASSESSMENT / PLAN:  PULMONARY A: No active issues P:   Goal O2 Sat >92 Obtain ABG  CARDIOVASCULAR A:  - Hemodynamically stable - Lactic acidosis of unclear etiology. Alcohol intoxication and starvation likely contributing. May have some liver dysfunction and not be clearing his lactate efficiently. P:  IVF Trend lactate EKG Check Trop  RENAL A:   AKI vs CKD P:   - Will get repeat BMP and LFT's - Will get VBG - Will continue IVF resuscitation  GASTROINTESTINAL A:   Moderate Malnutrition P:   Advance diet as tolerated  HEMATOLOGIC A:   Macrocytic Anemia DVT PPx P:  Folic acid Lovenox  INFECTIOUS A:   R/o sepsis, procalcitonin reassuring P:   Abx/ Cx Check HIV   ENDOCRINE A:   DM  Hypoglycemia P:   A1c CBG SSI Check cortisol, TSH  NEUROLOGIC A:   ETOH abuse P:   CIWA/ Ativan  Joni Reining, DO PGY-1 07/25/2013, 5:36 PM  I have personally obtained a history, examined the patient, evaluated laboratory and imaging results. I discussed with the resident and agree with the plan stated above.   Critical Care Time devoted to patient care services described in this note is 1 hour   Waynetta Pean, MD [place]

## 2013-07-25 NOTE — ED Notes (Signed)
Lactic acid shown to Dr Regenia Skeeter and Raquel Sarna, RN

## 2013-07-26 ENCOUNTER — Inpatient Hospital Stay (HOSPITAL_COMMUNITY): Payer: Medicare Other

## 2013-07-26 ENCOUNTER — Encounter (HOSPITAL_COMMUNITY): Payer: Self-pay | Admitting: Intensive Care

## 2013-07-26 DIAGNOSIS — N179 Acute kidney failure, unspecified: Secondary | ICD-10-CM | POA: Diagnosis not present

## 2013-07-26 DIAGNOSIS — D61818 Other pancytopenia: Secondary | ICD-10-CM | POA: Clinically undetermined

## 2013-07-26 DIAGNOSIS — G9340 Encephalopathy, unspecified: Secondary | ICD-10-CM | POA: Diagnosis not present

## 2013-07-26 DIAGNOSIS — D638 Anemia in other chronic diseases classified elsewhere: Secondary | ICD-10-CM | POA: Diagnosis present

## 2013-07-26 DIAGNOSIS — A419 Sepsis, unspecified organism: Secondary | ICD-10-CM | POA: Diagnosis not present

## 2013-07-26 DIAGNOSIS — T68XXXA Hypothermia, initial encounter: Secondary | ICD-10-CM | POA: Diagnosis not present

## 2013-07-26 DIAGNOSIS — E162 Hypoglycemia, unspecified: Secondary | ICD-10-CM | POA: Diagnosis not present

## 2013-07-26 LAB — VITAMIN B12: Vitamin B-12: 717 pg/mL (ref 211–911)

## 2013-07-26 LAB — COMPREHENSIVE METABOLIC PANEL
ALBUMIN: 2.9 g/dL — AB (ref 3.5–5.2)
ALBUMIN: 2.8 g/dL — AB (ref 3.5–5.2)
ALK PHOS: 37 U/L — AB (ref 39–117)
ALT: 24 U/L (ref 0–53)
ALT: 23 U/L (ref 0–53)
AST: 30 U/L (ref 0–37)
AST: 31 U/L (ref 0–37)
Alkaline Phosphatase: 35 U/L — ABNORMAL LOW (ref 39–117)
BILIRUBIN TOTAL: 0.5 mg/dL (ref 0.3–1.2)
BUN: 18 mg/dL (ref 6–23)
BUN: 17 mg/dL (ref 6–23)
CALCIUM: 8.1 mg/dL — AB (ref 8.4–10.5)
CO2: 17 mEq/L — ABNORMAL LOW (ref 19–32)
CO2: 22 mEq/L (ref 19–32)
CREATININE: 1.68 mg/dL — AB (ref 0.50–1.35)
Calcium: 8 mg/dL — ABNORMAL LOW (ref 8.4–10.5)
Chloride: 97 mEq/L (ref 96–112)
Chloride: 96 mEq/L (ref 96–112)
Creatinine, Ser: 1.71 mg/dL — ABNORMAL HIGH (ref 0.50–1.35)
GFR calc Af Amer: 47 mL/min — ABNORMAL LOW (ref >90)
GFR calc Af Amer: 48 mL/min — ABNORMAL LOW (ref >90)
GFR, EST NON AFRICAN AMERICAN: 40 mL/min — AB (ref >90)
GFR, EST NON AFRICAN AMERICAN: 41 mL/min — AB (ref >90)
GLUCOSE: 140 mg/dL — AB (ref 70–99)
Glucose, Bld: 153 mg/dL — ABNORMAL HIGH (ref 70–99)
Potassium: 5 mEq/L (ref 3.7–5.3)
Potassium: 4.3 mEq/L (ref 3.7–5.3)
SODIUM: 133 meq/L — AB (ref 137–147)
SODIUM: 135 meq/L — AB (ref 137–147)
TOTAL PROTEIN: 7.1 g/dL (ref 6.0–8.3)
Total Bilirubin: 0.6 mg/dL (ref 0.3–1.2)
Total Protein: 7.1 g/dL (ref 6.0–8.3)

## 2013-07-26 LAB — CBC
HEMATOCRIT: 29.6 % — AB (ref 39.0–52.0)
HEMOGLOBIN: 10.5 g/dL — AB (ref 13.0–17.0)
MCH: 35.6 pg — AB (ref 26.0–34.0)
MCHC: 35.5 g/dL (ref 30.0–36.0)
MCV: 100.3 fL — ABNORMAL HIGH (ref 78.0–100.0)
Platelets: 135 10*3/uL — ABNORMAL LOW (ref 150–400)
RBC: 2.95 MIL/uL — AB (ref 4.22–5.81)
RDW: 13 % (ref 11.5–15.5)
WBC: 3.9 10*3/uL — ABNORMAL LOW (ref 4.0–10.5)

## 2013-07-26 LAB — GLUCOSE, CAPILLARY
GLUCOSE-CAPILLARY: 133 mg/dL — AB (ref 70–99)
GLUCOSE-CAPILLARY: 125 mg/dL — AB (ref 70–99)
Glucose-Capillary: 137 mg/dL — ABNORMAL HIGH (ref 70–99)
Glucose-Capillary: 188 mg/dL — ABNORMAL HIGH (ref 70–99)
Glucose-Capillary: 209 mg/dL — ABNORMAL HIGH (ref 70–99)
Glucose-Capillary: 232 mg/dL — ABNORMAL HIGH (ref 70–99)

## 2013-07-26 LAB — LACTIC ACID, PLASMA: Lactic Acid, Venous: 1.2 mmol/L (ref 0.5–2.2)

## 2013-07-26 LAB — IRON AND TIBC
Iron: 82 ug/dL (ref 42–135)
Saturation Ratios: 39 % (ref 20–55)
TIBC: 212 ug/dL — ABNORMAL LOW (ref 215–435)
UIBC: 130 ug/dL (ref 125–400)

## 2013-07-26 LAB — BASIC METABOLIC PANEL
BUN: 16 mg/dL (ref 6–23)
CALCIUM: 8.3 mg/dL — AB (ref 8.4–10.5)
CO2: 18 mEq/L — ABNORMAL LOW (ref 19–32)
Chloride: 94 mEq/L — ABNORMAL LOW (ref 96–112)
Creatinine, Ser: 1.75 mg/dL — ABNORMAL HIGH (ref 0.50–1.35)
GFR calc Af Amer: 45 mL/min — ABNORMAL LOW (ref >90)
GFR calc non Af Amer: 39 mL/min — ABNORMAL LOW (ref >90)
Glucose, Bld: 172 mg/dL — ABNORMAL HIGH (ref 70–99)
Potassium: 3.9 mEq/L (ref 3.7–5.3)
SODIUM: 135 meq/L — AB (ref 137–147)

## 2013-07-26 LAB — CORTISOL: CORTISOL PLASMA: 18.2 ug/dL

## 2013-07-26 LAB — URINE CULTURE
COLONY COUNT: NO GROWTH
CULTURE: NO GROWTH

## 2013-07-26 LAB — HEMOGLOBIN A1C
Hgb A1c MFr Bld: 5.5 % (ref <5.7)
Mean Plasma Glucose: 111 mg/dL (ref <117)

## 2013-07-26 LAB — TSH: TSH: 1.09 u[IU]/mL (ref 0.350–4.500)

## 2013-07-26 LAB — HIV ANTIBODY (ROUTINE TESTING W REFLEX): HIV: NONREACTIVE

## 2013-07-26 LAB — FERRITIN: FERRITIN: 466 ng/mL — AB (ref 22–322)

## 2013-07-26 LAB — PROCALCITONIN: Procalcitonin: 0.47 ng/mL

## 2013-07-26 LAB — FOLATE: Folate: >20 ng/mL

## 2013-07-26 NOTE — Progress Notes (Signed)
[facility] PROGRESS NOTE  Robert Sparks PIR:[id number] DOB: Sep 17, 1945 DOA: 07/25/2013 PCP: No PCP Per Patient  Assessment/Plan: #1 sepsis Questionable etiology. Lactic acid level was elevated on admission and is trending down with hydration. Blood cultures are pending. Chest x-ray was negative for any acute infection. Urine cultures are pending. Continue empiric IV vancomycin IV Zosyn. Follow.  #2 metabolic acidosis/lactic acidosis Questionable etiology. May be secondary to alcohol intoxication versus starvation. Lactic acid level trending down. LFTs trending down. Continue empiric IV vancomycin and Zosyn. Will discontinue bicarbonate drip. Follow.   #3 acute renal failure versus acute on chronic kidney disease FENA = 3.91%. Abdominal ultrasound pending. Creatinine trending down with hydration. Patient with good urine output of 1800 cc since admission. Continue hydration. Follow.  #4 hypothermia Questionable etiology. Likely secondary to hypoglycemia versus infectious etiology. Lactic acid level was elevated at is trending down with hydration and empiric IV antibiotics. Patient is currently off the beta blocker. Cultures are pending. Follow.  #5 hypoglycemia Likely secondary to poor oral intake and alcohol use. Patient was not on any hypoglycemic agents. Hemoglobin A1c 5.5 and a such that if patient is diabetic. Continue to follow CBGs for the next 24 hours and then change to a.c. and at bedtime. Patient has been pancultured the results are pending. Continue empiric IV vancomycin IV Zosyn.  #6 history of alcohol abuse Stable. No DVTs noted. Continue Ativan see what protocol. Continue thiamine, folic acid, multivitamin.  #7 tobacco abuse Tobacco cessation. Continue nicotine patch.  #8 dehydration IV fluids.  #9 pancytopenia/macrocytic anemia Likely secondary to chronic alcohol abuse. Patient with no overt GI bleed. Check an anemia panel. Continue thiamine and folic acid. Follow  for now. Discontinue Lovenox.  #10 acute encephalopathy Likely secondary to hypoglycemic episode. Improved and back to baseline.  #11 history of diabetes Doubt if patient is diabetic. Hemoglobin A1c was 5.5. Continue to follow CBGs every 4 hours for the next 24 hours and then a.c. and at bedtime. Will discontinue sliding scale insulin.  #12 prophylaxis PPI for GI prophylaxis. SCDs for DVT prophylaxis.   Code Status: Full Family Communication: Updated patient no family at bedside. Disposition Plan: Remaining step down unit   Consultants:  PCCM: Dr. Erlene Senters  Procedures:  Chest x-ray 07/25/2013  Antibiotics:  IV vancomycin 07/25/2013  IV Zosyn 07/25/2013  HPI/Subjective: Patient with no complaints. Patient with no DTs. Patient denies any bleeding. Patient states he has a poor appetite.  Objective: Filed Vitals:   07/26/13 0800  BP:   Pulse:   Temp: 98.9 F (37.2 C)  Resp:     Intake/Output Summary (Last 24 hours) at 07/26/13 0929 Last data filed at 07/26/13 6301  Gross per 24 hour  Intake 2193.75 ml  Output   2675 ml  Net -481.25 ml   Filed Weights   07/25/13 1446 07/25/13 1700  Weight: 68.04 kg (150 lb) 68.04 kg (150 lb)    Exam:   General:  NAD  Cardiovascular: RRR no m/r/g  Respiratory: Patient. No wheezing, no crackles, no rhonchi  Abdomen: Soft, nontender, nondistended, positive bowel sounds  Musculoskeletal: No clubbing cyanosis or edema  Data Reviewed: Basic Metabolic Panel:  Recent Labs Lab 07/25/13 1248 07/25/13 1815 07/25/13 2307 07/26/13 0545  NA 134* 134* 133* 135*  K 4.5 5.1 5.0 4.3  CL 91* 98 97 96  CO2 9* 8* 17* 22  GLUCOSE 162* 130* 140* 153*  BUN 18 17 18 17   CREATININE 2.04* 1.66* 1.71* 1.68*  CALCIUM 9.1  8.1* 8.1* 8.0*  MG  --  1.9  --   --    Liver Function Tests:  Recent Labs Lab 07/25/13 1248 07/25/13 2307 07/26/13 0545  AST 41* 30 31  ALT 32 24 23  ALKPHOS 47 37* 35*  BILITOT 0.4 0.5 0.6   PROT 8.3 7.1 7.1  ALBUMIN 3.3* 2.9* 2.8*   No results found for this basename: LIPASE, AMYLASE,  in the last 168 hours No results found for this basename: AMMONIA,  in the last 168 hours CBC:  Recent Labs Lab 07/25/13 1248 07/25/13 1815 07/26/13 0545  WBC 7.3 5.2 3.9*  NEUTROABS 5.9  --   --   HGB 12.0* 10.2* 10.5*  HCT 35.5* 30.4* 29.6*  MCV 105.7* 105.2* 100.3*  PLT 174 141* 135*   Cardiac Enzymes:  Recent Labs Lab 07/25/13 1524 07/25/13 1815  CKTOTAL 68  --   TROPONINI  --  <0.30   BNP (last 3 results) No results found for this basename: PROBNP,  in the last 8760 hours CBG:  Recent Labs Lab 07/25/13 1541 07/25/13 2005 07/26/13 0117 07/26/13 0429 07/26/13 0719  GLUCAP 145* 129* 137* 188* 133*    Recent Results (from the past 240 hour(s))  MRSA PCR SCREENING     Status: None   Collection Time    07/25/13  5:00 PM      Result Value Range Status   MRSA by PCR NEGATIVE  NEGATIVE Final   Comment:            The GeneXpert MRSA Assay (FDA     approved for NASAL specimens     only), is one component of a     comprehensive MRSA colonization     surveillance program. It is not     intended to diagnose MRSA     infection nor to guide or     monitor treatment for     MRSA infections.     Studies: Dg Chest Portable 1 View  07/25/2013   CLINICAL DATA:  Hypoglycemia  EXAM: PORTABLE CHEST - 1 VIEW  COMPARISON:  None.  FINDINGS: The heart size and mediastinal contours are within normal limits. Both lungs are clear. The visualized skeletal structures are unremarkable.  IMPRESSION: No active disease.   Electronically Signed   By: Franchot Gallo M.D.   On: 07/25/2013 13:29    Scheduled Meds: . enoxaparin (LOVENOX) injection  40 mg Subcutaneous QHS  . folic acid  1 mg Oral Daily  . insulin aspart  0-9 Units Subcutaneous TID WC  . LORazepam  0-4 mg Oral Q6H   Followed by  . [START ON 07/27/2013] LORazepam  0-4 mg Oral Q12H  . multivitamin with minerals  1 tablet  Oral Daily  . nicotine  21 mg Transdermal Daily  . pantoprazole  40 mg Oral Q0600  . piperacillin-tazobactam (ZOSYN)  IV  3.375 g Intravenous Q8H  . sodium chloride  3 mL Intravenous Q12H  . thiamine  100 mg Oral Daily   Or  . thiamine  100 mg Intravenous Daily  . vancomycin  1,000 mg Intravenous Q24H   Continuous Infusions: . sodium chloride    . dextrose 5 % 1,000 mL with sodium bicarbonate 150 mEq infusion 125 mL/hr at 07/26/13 0909    Principal Problem:   Sepsis Active Problems:   Hypoglycemia   Acute encephalopathy   Hypothermia   ETOH abuse   Metabolic acidosis   ARF (acute renal failure)   Anemia  Pancytopenia    Time spent: 40 mins    THOMPSON,DANIEL M.D. [facility] Pager [phone]. If 7PM-7AM, please contact night-coverage at [email or web address], password [facility] 07/26/2013, 9:29 AM  LOS: 1 day

## 2013-07-26 NOTE — Evaluation (Signed)
Physical Therapy Evaluation Patient Details Name: Robert Sparks MRN: [id number] DOB: 1946/02/02 Today's Date: 07/26/2013 Time: 8756-4332 PT Time Calculation (min): 23 min  PT Assessment / Plan / Recommendation History of Present Illness  With history of tobacco abuse, prior history of diabetes per patient has been off medications for several years, history of alcohol abuse presented to the ED unresponsive. Patient is not quite sure what happened however states that was drinking the night prior to admission he drank about 2-340 ounce beers and drank close to half a pint of vodka mixed with water. Patient stated that his house mate found him unresponsive and subsequently EMS was called. Per ED report when EMS got that patient's CBGs were 20 was given some D50 and glucagon and his mental status improved and return back to his baseline  Clinical Impression  Pt pleasant, disoriented to time and situation, slow to respond and demonstrates balance and mobility deficits. Pt will benefit from acute therapy to maximize mobility, safety and function prior to discharge. Pt roommate is in [facility] due to amputation and unable to care for pt at home and pt unclear if any other family or friends can assist. Recommend [facility]NF at this time due to mobility deficits and lack of caregiver.     PT Assessment  Patient needs continued PT services    Follow Up Recommendations  SNF;Supervision for mobility/OOB    Does the patient have the potential to tolerate intense rehabilitation      Barriers to Discharge Decreased caregiver support      Equipment Recommendations  Rolling walker with 5" wheels    Recommendations for Other Services     Frequency Min 3X/week    Precautions / Restrictions Precautions Precautions: Fall   Pertinent Vitals/Pain VSS No pain      Mobility  Bed Mobility Overal bed mobility: Modified Independent Transfers Overall transfer level: Needs assistance Transfers: Sit to/from  Stand Sit to Stand: Min assist General transfer comment: min assist from bed and toilet due to posterior lean in standing Ambulation/Gait Ambulation/Gait assistance: Min assist Ambulation Distance (Feet): 300 Feet Assistive device: Rolling walker (2 wheeled) Gait Pattern/deviations: Step-through pattern;Decreased stride length Gait velocity interpretation: Below normal speed for age/gender General Gait Details: pt with cues throughout for anterior weight shift with assist to maintain balance and cueing for direction     Exercises     PT Diagnosis: Abnormality of gait  PT Problem List: Decreased cognition;Decreased activity tolerance;Decreased balance;Decreased mobility;Decreased knowledge of use of DME PT Treatment Interventions: Gait training;Stair training;Functional mobility training;Therapeutic activities;Therapeutic exercise;Patient/family education;Balance training;DME instruction     PT Goals(Current goals can be found in the care plan section) Acute Rehab PT Goals Patient Stated Goal: be able to return home PT Goal Formulation: With patient Time For Goal Achievement: 08/09/13 Potential to Achieve Goals: Good  Visit Information  Last PT Received On: 07/26/13 Assistance Needed: +1 History of Present Illness: With history of tobacco abuse, prior history of diabetes per patient has been off medications for several years, history of alcohol abuse presented to the ED unresponsive. Patient is not quite sure what happened however states that was drinking the night prior to admission he drank about 2-340 ounce beers and drank close to half a pint of vodka mixed with water. Patient stated that his house mate found him unresponsive and subsequently EMS was called. Per ED report when EMS got that patient's CBGs were 20 was given some D50 and glucagon and his mental status improved and return back  to his baseline       Prior [place] expects to be  discharged to:: Private residence Living Arrangements: Non-relatives/Friends Available Help at Discharge: Friend(s) Type of Home: House Home Access: Stairs to enter;Ramped entrance Entrance Stairs-Number of Steps: 3 Home Layout: One level [place]: None Prior Function Level of Independence: Independent Communication Communication: No difficulties    Cognition  Cognition Arousal/Alertness: Awake/alert Behavior During Therapy: Flat affect Overall Cognitive Status: Impaired/Different from baseline Area of Impairment: Orientation;Problem solving Orientation Level: Time;Situation Memory: Decreased short-term memory Problem Solving: Slow processing    Extremity/Trunk Assessment Upper Extremity Assessment Upper Extremity Assessment: Overall WFL for tasks assessed Lower Extremity Assessment Lower Extremity Assessment: Generalized weakness Cervical / Trunk Assessment Cervical / Trunk Assessment: Normal   Balance Balance Overall balance assessment: Needs assistance Sitting-balance support: Feet supported Sitting balance-Leahy Scale: Good Standing balance support: No upper extremity supported Standing balance-Leahy Scale: Poor  End of Session PT - End of Session Equipment Utilized During Treatment: Gait belt Activity Tolerance: Patient tolerated treatment well Patient left: in bed;with call bell/phone within reach;with bed alarm set Nurse Communication: Mobility status;Precautions  GP     Lanetta Inch Beth 07/26/2013, 12:15 PM Elwyn Reach, [place]

## 2013-07-27 DIAGNOSIS — N189 Chronic kidney disease, unspecified: Secondary | ICD-10-CM | POA: Diagnosis present

## 2013-07-27 DIAGNOSIS — E162 Hypoglycemia, unspecified: Secondary | ICD-10-CM | POA: Diagnosis not present

## 2013-07-27 DIAGNOSIS — N179 Acute kidney failure, unspecified: Secondary | ICD-10-CM | POA: Diagnosis present

## 2013-07-27 DIAGNOSIS — T68XXXA Hypothermia, initial encounter: Secondary | ICD-10-CM | POA: Diagnosis not present

## 2013-07-27 DIAGNOSIS — G9340 Encephalopathy, unspecified: Secondary | ICD-10-CM | POA: Diagnosis not present

## 2013-07-27 DIAGNOSIS — A419 Sepsis, unspecified organism: Secondary | ICD-10-CM | POA: Diagnosis not present

## 2013-07-27 LAB — BASIC METABOLIC PANEL
BUN: 14 mg/dL (ref 6–23)
CALCIUM: 8.5 mg/dL (ref 8.4–10.5)
CO2: 22 meq/L (ref 19–32)
Chloride: 98 mEq/L (ref 96–112)
Creatinine, Ser: 1.69 mg/dL — ABNORMAL HIGH (ref 0.50–1.35)
GFR calc non Af Amer: 41 mL/min — ABNORMAL LOW (ref >90)
GFR, EST AFRICAN AMERICAN: 47 mL/min — AB (ref >90)
Glucose, Bld: 174 mg/dL — ABNORMAL HIGH (ref 70–99)
Potassium: 3.8 mEq/L (ref 3.7–5.3)
SODIUM: 138 meq/L (ref 137–147)

## 2013-07-27 LAB — CBC
HCT: 29.4 % — ABNORMAL LOW (ref 39.0–52.0)
Hemoglobin: 10.4 g/dL — ABNORMAL LOW (ref 13.0–17.0)
MCH: 35.5 pg — ABNORMAL HIGH (ref 26.0–34.0)
MCHC: 35.4 g/dL (ref 30.0–36.0)
MCV: 100.3 fL — AB (ref 78.0–100.0)
Platelets: 130 10*3/uL — ABNORMAL LOW (ref 150–400)
RBC: 2.93 MIL/uL — AB (ref 4.22–5.81)
RDW: 13.4 % (ref 11.5–15.5)
WBC: 4.5 10*3/uL (ref 4.0–10.5)

## 2013-07-27 LAB — GLUCOSE, CAPILLARY
Glucose-Capillary: 144 mg/dL — ABNORMAL HIGH (ref 70–99)
Glucose-Capillary: 166 mg/dL — ABNORMAL HIGH (ref 70–99)
Glucose-Capillary: 198 mg/dL — ABNORMAL HIGH (ref 70–99)
Glucose-Capillary: 255 mg/dL — ABNORMAL HIGH (ref 70–99)
Glucose-Capillary: 396 mg/dL — ABNORMAL HIGH (ref 70–99)

## 2013-07-27 LAB — PROCALCITONIN: PROCALCITONIN: 0.37 ng/mL

## 2013-07-27 LAB — CLOSTRIDIUM DIFFICILE BY PCR: Toxigenic C. Difficile by PCR: NEGATIVE

## 2013-07-27 MED ORDER — INSULIN ASPART 100 UNIT/ML ~~LOC~~ SOLN
0.0000 [IU] | Freq: Three times a day (TID) | SUBCUTANEOUS | Status: DC
  Administered 2013-07-27: 5 [IU] via SUBCUTANEOUS
  Administered 2013-07-28 (×2): 2 [IU] via SUBCUTANEOUS
  Administered 2013-07-28: 3 [IU] via SUBCUTANEOUS
  Administered 2013-07-29: 1 [IU] via SUBCUTANEOUS
  Administered 2013-07-29: 2 [IU] via SUBCUTANEOUS

## 2013-07-27 MED ORDER — LOPERAMIDE HCL 2 MG PO CAPS
2.0000 mg | ORAL_CAPSULE | ORAL | Status: DC | PRN
  Administered 2013-07-27: 2 mg via ORAL
  Filled 2013-07-27: qty 1

## 2013-07-27 NOTE — Progress Notes (Signed)
Utilization review completed.  

## 2013-07-27 NOTE — Progress Notes (Addendum)
Dr. Grandville Silos notified patient blood sugar is 396. New order received. Patient resting in bed no acute dsitress.

## 2013-07-27 NOTE — Progress Notes (Signed)
Attempted to call report. Nurse is in middle of dressing change.

## 2013-07-27 NOTE — Progress Notes (Signed)
[facility] PROGRESS NOTE  Robert Sparks OEU:[id number] DOB: November 06, 1945 DOA: 07/25/2013 PCP: No PCP Per Patient  Assessment/Plan: #1 sepsis Questionable etiology. Lactic acid level was elevated on admission and is trending down with hydration. Blood cultures are pending. Chest x-ray was negative for any acute infection. Urine cultures are pending. Continue empiric IV Zosyn. D/C IV vancomycin. Follow.  #2 metabolic acidosis/lactic acidosis Questionable etiology. May be secondary to alcohol intoxication versus starvation. Lactic acid level trending down. LFTs trending down. Continue empiric IV Zosyn. D/C Vancomycin. Follow.   #3 acute renal failure versus acute on chronic kidney disease FENA = 3.91%. Abdominal ultrasound neg for hydronephrosis. Likely acute on CKD. Creatinine trending down with hydration. Prob baseline Cr 1.6-1.8. Patient with good urine output . NSL IVF.  Follow.  #4 hypothermia Questionable etiology. Likely secondary to hypoglycemia versus infectious etiology. Lactic acid level was elevated at is trending down with hydration and empiric IV antibiotics. Patient is currently off the bear hugger. Cultures are pending. Follow.  #5 hypoglycemia Likely secondary to poor oral intake and alcohol use. Patient was not on any hypoglycemic agents. Hemoglobin A1c 5.5 and a such wonder if patient is diabetic or diet controlled. Change CBGs to a.c. and at bedtime. Patient has been pancultured the results are pending. Continue empiric IV Zosyn.  #6 history of alcohol abuse Stable. No DTs noted. Continue Ativan CIWA protocol. Continue thiamine, folic acid, multivitamin.  #7 tobacco abuse Tobacco cessation. Continue nicotine patch.  #8 dehydration Improved. NSL IV fluids.  #9 pancytopenia/macrocytic anemia/AOCD Likely secondary to chronic alcohol abuse. Patient with no overt GI bleed. Anemia panel c/w AOCD. Continue thiamine and folic acid. Follow for now.   #10 acute  encephalopathy Likely secondary to hypoglycemic episode. Improved and back to baseline.  #11 history of diabetes Doubt if patient is diabetic. Hemoglobin A1c was 5.5. Change CBGs to a.c. and at bedtime.  #12 prophylaxis PPI for GI prophylaxis. SCDs for DVT prophylaxis.   Code Status: Full Family Communication: Updated patient no family at bedside. Disposition Plan: Transfer to telemetry.   Consultants:  PCCM: Dr. Erlene Senters 07/25/13  Procedures:  Chest x-ray 07/25/2013  Abdominal US 07/26/13  Antibiotics:  IV vancomycin 07/25/2013--->07/27/13  IV Zosyn 07/25/2013  HPI/Subjective: Patient with no complaints. Patient with no DTs. Patient denies any bleeding. Patient eating breakfast.  Objective: Filed Vitals:   07/27/13 0402  BP: 154/45  Pulse: 104  Temp: 97.7 F (36.5 C)  Resp: 21    Intake/Output Summary (Last 24 hours) at 07/27/13 0903 Last data filed at 07/27/13 0800  Gross per 24 hour  Intake   3603 ml  Output    500 ml  Net   3103 ml   Filed Weights   07/25/13 1446 07/25/13 1700  Weight: 68.04 kg (150 lb) 68.04 kg (150 lb)    Exam:   General:  NAD  Cardiovascular: RRR no m/r/g  Respiratory: Patient. No wheezing, no crackles, no rhonchi  Abdomen: Soft, nontender, nondistended, positive bowel sounds  Musculoskeletal: No clubbing cyanosis or edema  Data Reviewed: Basic Metabolic Panel:  Recent Labs Lab 07/25/13 1248 07/25/13 1815 07/25/13 2307 07/26/13 0545 07/26/13 1510 07/27/13 0235  NA 134* 134* 133* 135* 135* 138  K 4.5 5.1 5.0 4.3 3.9 3.8  CL 91* 98 97 96 94* 98  CO2 9* 8* 17* 22 18* 22  GLUCOSE 162* 130* 140* 153* 172* 174*  BUN 18 17 18 17 16 14   CREATININE 2.04* 1.66* 1.71* 1.68* 1.75* 1.69*  CALCIUM 9.1 8.1* 8.1* 8.0* 8.3* 8.5  MG  --  1.9  --   --   --   --    Liver Function Tests:  Recent Labs Lab 07/25/13 1248 07/25/13 2307 07/26/13 0545  AST 41* 30 31  ALT 32 24 23  ALKPHOS 47 37* 35*  BILITOT 0.4 0.5  0.6  PROT 8.3 7.1 7.1  ALBUMIN 3.3* 2.9* 2.8*   No results found for this basename: LIPASE, AMYLASE,  in the last 168 hours No results found for this basename: AMMONIA,  in the last 168 hours CBC:  Recent Labs Lab 07/25/13 1248 07/25/13 1815 07/26/13 0545 07/27/13 0235  WBC 7.3 5.2 3.9* 4.5  NEUTROABS 5.9  --   --   --   HGB 12.0* 10.2* 10.5* 10.4*  HCT 35.5* 30.4* 29.6* 29.4*  MCV 105.7* 105.2* 100.3* 100.3*  PLT 174 141* 135* 130*   Cardiac Enzymes:  Recent Labs Lab 07/25/13 1524 07/25/13 1815  CKTOTAL 68  --   TROPONINI  --  <0.30   BNP (last 3 results) No results found for this basename: PROBNP,  in the last 8760 hours CBG:  Recent Labs Lab 07/26/13 1658 07/26/13 2005 07/27/13 0008 07/27/13 0359 07/27/13 0823  GLUCAP 209* 232* 198* 166* 144*    Recent Results (from the past 240 hour(s))  CULTURE, BLOOD (ROUTINE X 2)     Status: None   Collection Time    07/25/13  1:25 PM      Result Value Range Status   Specimen Description BLOOD HAND RIGHT   Final   Special Requests BOTTLES DRAWN AEROBIC ONLY 5CC   Final   Culture  Setup Time     Final   Value: 07/25/2013 16:58     Performed at [facility]   Culture     Final   Value:        BLOOD CULTURE RECEIVED NO GROWTH TO DATE CULTURE WILL BE HELD FOR 5 DAYS BEFORE ISSUING A FINAL NEGATIVE REPORT     Performed at [facility]   Report Status PENDING   Incomplete  CULTURE, BLOOD (ROUTINE X 2)     Status: None   Collection Time    07/25/13  1:30 PM      Result Value Range Status   Specimen Description BLOOD HAND LEFT   Final   Special Requests BOTTLES DRAWN AEROBIC AND ANAEROBIC 10CC   Final   Culture  Setup Time     Final   Value: 07/25/2013 16:58     Performed at [facility]   Culture     Final   Value:        BLOOD CULTURE RECEIVED NO GROWTH TO DATE CULTURE WILL BE HELD FOR 5 DAYS BEFORE ISSUING A FINAL NEGATIVE REPORT     Performed at [facility]   Report Status  PENDING   Incomplete  MRSA PCR SCREENING     Status: None   Collection Time    07/25/13  5:00 PM      Result Value Range Status   MRSA by PCR NEGATIVE  NEGATIVE Final   Comment:            The GeneXpert MRSA Assay (FDA     approved for NASAL specimens     only), is one component of a     comprehensive MRSA colonization     surveillance program. It is not     intended to diagnose MRSA  infection nor to guide or     monitor treatment for     MRSA infections.  URINE CULTURE     Status: None   Collection Time    07/25/13  5:26 PM      Result Value Range Status   Specimen Description URINE, CLEAN CATCH   Final   Special Requests NONE   Final   Culture  Setup Time     Final   Value: 07/25/2013 23:04     Performed at [place] [facility]     Final   Value: NO GROWTH     Performed at [facility]   Culture     Final   Value: NO GROWTH     Performed at [facility]   Report Status 07/26/2013 FINAL   Final     Studies: US Abdomen Complete  07/26/2013   CLINICAL DATA:  Acute renal failure. Ethanol abuse. Sepsis. Acute encephalopathy.  EXAM: ULTRASOUND ABDOMEN COMPLETE  COMPARISON:  None.  FINDINGS: Gallbladder:  No gallstones or wall thickening visualized. No sonographic Murphy sign noted.  Common bile duct:  Diameter: 6 mm  Liver:  No focal lesion identified. Within normal limits in parenchymal echogenicity.  IVC:  No abnormality visualized.  Pancreas:  Visualized portion unremarkable.  Spleen:  Size and appearance within normal limits.  Right Kidney:  Length: 10.2 cm. Echogenicity within normal limits. No mass or hydronephrosis visualized.  Left Kidney:  Length: 10.5 cm. Echogenicity within normal limits. No mass or hydronephrosis visualized.  Abdominal aorta:  No aneurysm visualized.  Other findings:  None.  IMPRESSION: No gallstones visualized. No evidence of hydronephrosis or other acute findings.   Electronically Signed   By: Earle Gell M.D.   On:  07/26/2013 12:17   Dg Chest Portable 1 View  07/25/2013   CLINICAL DATA:  Hypoglycemia  EXAM: PORTABLE CHEST - 1 VIEW  COMPARISON:  None.  FINDINGS: The heart size and mediastinal contours are within normal limits. Both lungs are clear. The visualized skeletal structures are unremarkable.  IMPRESSION: No active disease.   Electronically Signed   By: Franchot Gallo M.D.   On: 07/25/2013 13:29    Scheduled Meds: . folic acid  1 mg Oral Daily  . LORazepam  0-4 mg Oral Q6H   Followed by  . LORazepam  0-4 mg Oral Q12H  . multivitamin with minerals  1 tablet Oral Daily  . nicotine  21 mg Transdermal Daily  . pantoprazole  40 mg Oral Q0600  . piperacillin-tazobactam (ZOSYN)  IV  3.375 g Intravenous Q8H  . sodium chloride  3 mL Intravenous Q12H  . thiamine  100 mg Oral Daily   Or  . thiamine  100 mg Intravenous Daily  . vancomycin  1,000 mg Intravenous Q24H   Continuous Infusions: . sodium chloride 125 mL/hr at 07/26/13 1900    Principal Problem:   Sepsis Active Problems:   Hypoglycemia   Acute encephalopathy   Hypothermia   ETOH abuse   Metabolic acidosis   ARF (acute renal failure)   Anemia, chronic disease   Pancytopenia   Renal failure (ARF), acute on chronic    Time spent: 94 mins    THOMPSON,DANIEL M.D. [facility] Pager [phone]. If 7PM-7AM, please contact night-coverage at [email or web address], password [facility] 07/27/2013, 9:03 AM  LOS: 2 days

## 2013-07-28 DIAGNOSIS — I10 Essential (primary) hypertension: Secondary | ICD-10-CM | POA: Diagnosis not present

## 2013-07-28 DIAGNOSIS — G9340 Encephalopathy, unspecified: Secondary | ICD-10-CM | POA: Diagnosis not present

## 2013-07-28 DIAGNOSIS — E162 Hypoglycemia, unspecified: Secondary | ICD-10-CM | POA: Diagnosis not present

## 2013-07-28 DIAGNOSIS — A419 Sepsis, unspecified organism: Secondary | ICD-10-CM | POA: Diagnosis not present

## 2013-07-28 LAB — GLUCOSE, CAPILLARY
GLUCOSE-CAPILLARY: 184 mg/dL — AB (ref 70–99)
Glucose-Capillary: 231 mg/dL — ABNORMAL HIGH (ref 70–99)
Glucose-Capillary: 174 mg/dL — ABNORMAL HIGH (ref 70–99)
Glucose-Capillary: 173 mg/dL — ABNORMAL HIGH (ref 70–99)

## 2013-07-28 LAB — CBC
HCT: 30.2 % — ABNORMAL LOW (ref 39.0–52.0)
Hemoglobin: 10.6 g/dL — ABNORMAL LOW (ref 13.0–17.0)
MCH: 35.7 pg — AB (ref 26.0–34.0)
MCHC: 35.1 g/dL (ref 30.0–36.0)
MCV: 101.7 fL — AB (ref 78.0–100.0)
PLATELETS: 122 10*3/uL — AB (ref 150–400)
RBC: 2.97 MIL/uL — ABNORMAL LOW (ref 4.22–5.81)
RDW: 13.6 % (ref 11.5–15.5)
WBC: 6 10*3/uL (ref 4.0–10.5)

## 2013-07-28 LAB — HEMOGLOBIN A1C
Hgb A1c MFr Bld: 5.9 % — ABNORMAL HIGH (ref <5.7)
Mean Plasma Glucose: 123 mg/dL — ABNORMAL HIGH (ref <117)

## 2013-07-28 LAB — BASIC METABOLIC PANEL
BUN: 11 mg/dL (ref 6–23)
CALCIUM: 9 mg/dL (ref 8.4–10.5)
CHLORIDE: 97 meq/L (ref 96–112)
CO2: 19 meq/L (ref 19–32)
CREATININE: 1.55 mg/dL — AB (ref 0.50–1.35)
GFR calc Af Amer: 53 mL/min — ABNORMAL LOW (ref >90)
GFR calc non Af Amer: 45 mL/min — ABNORMAL LOW (ref >90)
GLUCOSE: 240 mg/dL — AB (ref 70–99)
Potassium: 3.9 mEq/L (ref 3.7–5.3)
Sodium: 137 mEq/L (ref 137–147)

## 2013-07-28 MED ORDER — LEVOFLOXACIN 500 MG PO TABS
500.0000 mg | ORAL_TABLET | Freq: Once | ORAL | Status: AC
  Administered 2013-07-28: 500 mg via ORAL
  Filled 2013-07-28: qty 1

## 2013-07-28 MED ORDER — LEVOFLOXACIN 250 MG PO TABS
250.0000 mg | ORAL_TABLET | Freq: Every day | ORAL | Status: DC
  Administered 2013-07-29: 250 mg via ORAL
  Filled 2013-07-28: qty 1

## 2013-07-28 MED ORDER — AMLODIPINE BESYLATE 10 MG PO TABS
10.0000 mg | ORAL_TABLET | Freq: Every day | ORAL | Status: DC
  Administered 2013-07-28 – 2013-07-29 (×2): 10 mg via ORAL
  Filled 2013-07-28 (×2): qty 1

## 2013-07-28 MED ORDER — LEVOFLOXACIN 500 MG PO TABS
500.0000 mg | ORAL_TABLET | Freq: Every day | ORAL | Status: DC
  Filled 2013-07-28: qty 1

## 2013-07-28 MED ORDER — INSULIN GLARGINE 100 UNIT/ML ~~LOC~~ SOLN
5.0000 [IU] | Freq: Every day | SUBCUTANEOUS | Status: DC
Start: 2013-07-28 — End: 2013-07-29
  Administered 2013-07-28: 5 [IU] via SUBCUTANEOUS
  Filled 2013-07-28 (×2): qty 0.05

## 2013-07-28 NOTE — Clinical Social Work Psychosocial (Signed)
Clinical Social Work Department BRIEF PSYCHOSOCIAL ASSESSMENT 07/28/2013  Patient:  Robert Sparks, Robert Sparks     Account Number:  [id number]     Admit date:  07/25/2013  Clinical Social Worker:  Frederico Hamman  Date/Time:  07/28/2013 02:06 AM  Referred by:  Physician  Date Referred:  07/27/2013 Referred for  SNF Placement   Other Referral:   CSW rec'd consults on 1/18 and 1/19   Interview type:  Patient Other interview type:    PSYCHOSOCIAL DATA Living Status:  FRIEND(S) Admitted from facility:   Level of care:   Primary support name:   Primary support relationship to patient:   Degree of support available:   Patient reported that he lives with a friend. Patient did not disclose if he had any family nearby.    CURRENT CONCERNS Current Concerns  Post-Acute Placement   Other Concerns:    SOCIAL WORK ASSESSMENT / PLAN CSW talked with patient about discharge planning and MD's recommendation of rehab at a skilled facility. CSW explained the prupose of rehab and answered patient's questions. Patient agreeable to going to a facility for short-term rehab and provided a preference - [facility], as this facility is near his home.   Assessment/plan status:  Psychosocial Support/Ongoing Assessment of Needs Other assessment/ plan:   Information/referral to community resources:   Patient provided with a list of skilled facilities for [facility].    PATIENT'S/FAMILY'S RESPONSE TO PLAN OF CARE: Robert Sparks was alert and open to talking with CSW, however CSW had to repeat some things more than once to assure he understood.

## 2013-07-28 NOTE — Care Management Note (Signed)
   CARE MANAGEMENT NOTE 07/28/2013  Patient:  Robert Sparks, Robert Sparks   Account Number:  [id number]  Date Initiated:  07/28/2013  Documentation initiated by:  Jasmine Pang  Subjective/Objective Assessment:   Noted referral for PCP and transportation     Action/Plan:   Spoke with Dr Grandville Silos and explained that Pt medical needs will be managed at SNF by MD providing services at the facility . When the pt d/c from the SNF he can ask for assistance with setting up at PCP by the facility social worker.   Anticipated DC Date:     Anticipated DC Plan:  SKILLED NURSING FACILITY         Choice offered to / List presented to:             Status of service:  Completed, signed off Medicare Important Message given?   (If response is "NO", the following Medicare IM given date fields will be blank) Date Medicare IM given:   Date Additional Medicare IM given:    Discharge Disposition:  SKILLED NURSING FACILITY  Per UR Regulation:    If discussed at Long Length of Stay Meetings, dates discussed:    Comments:  07/28/2013 Spoke with Dr Grandville Silos and explained that this pt will have PCP services provided at the SNF and that transportation to a PCP will not be necessary as the PCP at his facility will see the pt at the facility. At the time of d/c from the SNF the SW may be able to assist with a PCP, or the pt may be able to arrange his own appointment as he has insurance . Jasmine Pang RN MPH, case manager, [phone]

## 2013-07-28 NOTE — Progress Notes (Signed)
Inpatient Diabetes Program Recommendations  AACE/ADA: New Consensus Statement on Inpatient Glycemic Control (2013)  Target Ranges:  Prepandial:   less than 140 mg/dL      Peak postprandial:   less than 180 mg/dL (1-2 hours)      Critically ill patients:  140 - 180 mg/dL     Results for FAMOUS, EISENHARDT (MRN [id number]) as of 07/28/2013 13:35  Ref. Range 07/28/2013 08:26 07/28/2013 11:36  Glucose-Capillary Latest Range: 70-99 mg/dL 231 (H) 184 (H)    **Patient admitted with AMS and hypoglycemia.  Has history of ETOH abuse.  **A1c 5.9% (07/27/13).  Noted Dr. Grandville Silos may plan to send patient home on some sort of oral DM therapy.  **Recommend the following for d/c: Tradgenta 5 mg daily  (Tradjenta is a glucose dependent agent that will work more when the patient's blood glucose levels are higher and will work less when the patient's CBGs are lower- Lower risk of hypoglycemia with Tradgenta versus oral sulfonylurea drugs)  **Outpatient DM education referral placed for this patient for after his stay in the SNF.   Will follow. Wyn Quaker RN, MSN, CDE Diabetes Coordinator Inpatient Diabetes Program Team Pager: [phone] (8a-10p)

## 2013-07-28 NOTE — Progress Notes (Signed)
OT Cancellation and Discharge Note  Patient Details Name: Robert Sparks MRN: [id number] DOB: [birth year]   Cancelled Treatment:    Reason Eval/Treat Not Completed: OT screened, no needs identified, will sign off. Plan is for pt to go to SNF . Will defer OT eval and treat to that facility. Acute OT will sign off.  Almon Register [phone] 07/28/2013, 3:06 PM

## 2013-07-28 NOTE — Progress Notes (Signed)
[facility] PROGRESS NOTE  Robert Sparks MWN:[id number] DOB: 1945/09/02 DOA: 07/25/2013 PCP: No PCP Per Patient  Assessment/Plan: #1 sepsis Questionable etiology. Lactic acid level was elevated on admission and is trending down with hydration. Blood cultures are pending. Chest x-ray was negative for any acute infection. Urine cultures are neg. Afebrile. Change empiric IV Zosyn to oral levaquin. Follow.  #2 metabolic acidosis/lactic acidosis Questionable etiology. May be secondary to alcohol intoxication versus starvation. Lactic acid level trending down. LFTs trending down. Change empiric IV Zosyn to oral levaquin. D/C. Follow.   #3 acute renal failure versus acute on chronic kidney disease FENA = 3.91%. Abdominal ultrasound neg for hydronephrosis. Likely acute on CKD. Creatinine trending down with hydration. Prob baseline Cr 1.6-1.8. Patient with good urine output . NSL IVF.  Follow.  #4 hypothermia Questionable etiology. Likely secondary to hypoglycemia versus infectious etiology. Lactic acid level was elevated at is trending down with hydration and empiric IV antibiotics. Patient is currently off the bear hugger. Cultures are pending. Continue empiric antibiotics. Follow.  #5 hypoglycemia Likely secondary to poor oral intake and alcohol use. Patient was not on any hypoglycemic agents. Hemoglobin A1c 5.5 and a such wonder if patient is diabetic or diet controlled. Patient has been pancultured the results are pending. Change empiric IV Zosyn to oral levaquin.  #6 history of alcohol abuse Stable. No DTs noted. Continue Ativan CIWA protocol. Continue thiamine, folic acid, multivitamin.  #7 tobacco abuse Tobacco cessation. Continue nicotine patch.  #8 dehydration Improved. NSL IV fluids.  #9 pancytopenia/macrocytic anemia/AOCD Likely secondary to chronic alcohol abuse. Patient with no overt GI bleed. Anemia panel c/w AOCD. Continue thiamine and folic acid. Follow for now.   #10  acute encephalopathy Likely secondary to hypoglycemic episode. Improved and back to baseline.  #11 history of diabetes prob diet controlled.  Hemoglobin A1c was 5.5. Repeat A1C 5.9. CBGs have ranged from 144 - 255. Will start low dose Lantus and continue SSI. On d/c may need to be started on glucophage.   #12 HTN Start norvasc.  #13 prophylaxis PPI for GI prophylaxis. SCDs for DVT prophylaxis.   Code Status: Full Family Communication: Updated patient no family at bedside. Disposition Plan: SNF when medically stable.   Consultants:  PCCM: Dr. Erlene Senters 07/25/13  Procedures:  Chest x-ray 07/25/2013  Abdominal US 07/26/13  Antibiotics:  IV vancomycin 07/25/2013--->07/27/13  IV Zosyn 07/25/2013--->07/28/13  Oral Levaquin 07/28/13  HPI/Subjective: Patient with no complaints. Patient with no DTs. Patient denies any bleeding.  Objective: Filed Vitals:   07/28/13 0948  BP: 129/87  Pulse: 83  Temp: 99.5 F (37.5 C)  Resp: 20    Intake/Output Summary (Last 24 hours) at 07/28/13 0951 Last data filed at 07/28/13 0441  Gross per 24 hour  Intake    483 ml  Output   1902 ml  Net  -1419 ml   Filed Weights   07/25/13 1446 07/25/13 1700 07/27/13 2051  Weight: 68.04 kg (150 lb) 68.04 kg (150 lb) 67.268 kg (148 lb 4.8 oz)    Exam:   General:  NAD  Cardiovascular: RRR no m/r/g  Respiratory: Patient. No wheezing, no crackles, no rhonchi  Abdomen: Soft, nontender, nondistended, positive bowel sounds  Musculoskeletal: No clubbing cyanosis or edema  Data Reviewed: Basic Metabolic Panel:  Recent Labs Lab 07/25/13 1248 07/25/13 1815 07/25/13 2307 07/26/13 0545 07/26/13 1510 07/27/13 0235 07/28/13 0455  NA 134* 134* 133* 135* 135* 138 137  K 4.5 5.1 5.0 4.3 3.9 3.8 3.9  CL 91* 98 97 96 94* 98 97  CO2 9* 8* 17* 22 18* 22 19  GLUCOSE 162* 130* 140* 153* 172* 174* 240*  BUN 18 17 18 17 16 14 11   CREATININE 2.04* 1.66* 1.71* 1.68* 1.75* 1.69* 1.55*   CALCIUM 9.1 8.1* 8.1* 8.0* 8.3* 8.5 9.0  MG  --  1.9  --   --   --   --   --    Liver Function Tests:  Recent Labs Lab 07/25/13 1248 07/25/13 2307 07/26/13 0545  AST 41* 30 31  ALT 32 24 23  ALKPHOS 47 37* 35*  BILITOT 0.4 0.5 0.6  PROT 8.3 7.1 7.1  ALBUMIN 3.3* 2.9* 2.8*   No results found for this basename: LIPASE, AMYLASE,  in the last 168 hours No results found for this basename: AMMONIA,  in the last 168 hours CBC:  Recent Labs Lab 07/25/13 1248 07/25/13 1815 07/26/13 0545 07/27/13 0235 07/28/13 0455  WBC 7.3 5.2 3.9* 4.5 6.0  NEUTROABS 5.9  --   --   --   --   HGB 12.0* 10.2* 10.5* 10.4* 10.6*  HCT 35.5* 30.4* 29.6* 29.4* 30.2*  MCV 105.7* 105.2* 100.3* 100.3* 101.7*  PLT 174 141* 135* 130* 122*   Cardiac Enzymes:  Recent Labs Lab 07/25/13 1524 07/25/13 1815  CKTOTAL 68  --   TROPONINI  --  <0.30   BNP (last 3 results) No results found for this basename: PROBNP,  in the last 8760 hours CBG:  Recent Labs Lab 07/27/13 0359 07/27/13 0823 07/27/13 1140 07/27/13 1643 07/28/13 0826  GLUCAP 166* 144* 396* 255* 231*    Recent Results (from the past 240 hour(s))  CULTURE, BLOOD (ROUTINE X 2)     Status: None   Collection Time    07/25/13  1:25 PM      Result Value Range Status   Specimen Description BLOOD HAND RIGHT   Final   Special Requests BOTTLES DRAWN AEROBIC ONLY 5CC   Final   Culture  Setup Time     Final   Value: 07/25/2013 16:58     Performed at [facility]   Culture     Final   Value:        BLOOD CULTURE RECEIVED NO GROWTH TO DATE CULTURE WILL BE HELD FOR 5 DAYS BEFORE ISSUING A FINAL NEGATIVE REPORT     Performed at [facility]   Report Status PENDING   Incomplete  CULTURE, BLOOD (ROUTINE X 2)     Status: None   Collection Time    07/25/13  1:30 PM      Result Value Range Status   Specimen Description BLOOD HAND LEFT   Final   Special Requests BOTTLES DRAWN AEROBIC AND ANAEROBIC 10CC   Final   Culture  Setup  Time     Final   Value: 07/25/2013 16:58     Performed at [facility]   Culture     Final   Value:        BLOOD CULTURE RECEIVED NO GROWTH TO DATE CULTURE WILL BE HELD FOR 5 DAYS BEFORE ISSUING A FINAL NEGATIVE REPORT     Performed at [facility]   Report Status PENDING   Incomplete  MRSA PCR SCREENING     Status: None   Collection Time    07/25/13  5:00 PM      Result Value Range Status   MRSA by PCR NEGATIVE  NEGATIVE Final  Comment:            The GeneXpert MRSA Assay (FDA     approved for NASAL specimens     only), is one component of a     comprehensive MRSA colonization     surveillance program. It is not     intended to diagnose MRSA     infection nor to guide or     monitor treatment for     MRSA infections.  URINE CULTURE     Status: None   Collection Time    07/25/13  5:26 PM      Result Value Range Status   Specimen Description URINE, CLEAN CATCH   Final   Special Requests NONE   Final   Culture  Setup Time     Final   Value: 07/25/2013 23:04     Performed at [place]     Final   Value: NO GROWTH     Performed at [facility]   Culture     Final   Value: NO GROWTH     Performed at [facility]   Report Status 07/26/2013 FINAL   Final  CLOSTRIDIUM DIFFICILE BY PCR     Status: None   Collection Time    07/27/13  9:50 AM      Result Value Range Status   C difficile by pcr NEGATIVE  NEGATIVE Final     Studies: US Abdomen Complete  07/26/2013   CLINICAL DATA:  Acute renal failure. Ethanol abuse. Sepsis. Acute encephalopathy.  EXAM: ULTRASOUND ABDOMEN COMPLETE  COMPARISON:  None.  FINDINGS: Gallbladder:  No gallstones or wall thickening visualized. No sonographic Murphy sign noted.  Common bile duct:  Diameter: 6 mm  Liver:  No focal lesion identified. Within normal limits in parenchymal echogenicity.  IVC:  No abnormality visualized.  Pancreas:  Visualized portion unremarkable.  Spleen:  Size and  appearance within normal limits.  Right Kidney:  Length: 10.2 cm. Echogenicity within normal limits. No mass or hydronephrosis visualized.  Left Kidney:  Length: 10.5 cm. Echogenicity within normal limits. No mass or hydronephrosis visualized.  Abdominal aorta:  No aneurysm visualized.  Other findings:  None.  IMPRESSION: No gallstones visualized. No evidence of hydronephrosis or other acute findings.   Electronically Signed   By: Earle Gell M.D.   On: 07/26/2013 12:17    Scheduled Meds: . amLODipine  10 mg Oral Daily  . folic acid  1 mg Oral Daily  . insulin aspart  0-9 Units Subcutaneous TID WC  . LORazepam  0-4 mg Oral Q12H  . multivitamin with minerals  1 tablet Oral Daily  . nicotine  21 mg Transdermal Daily  . pantoprazole  40 mg Oral Q0600  . piperacillin-tazobactam (ZOSYN)  IV  3.375 g Intravenous Q8H  . sodium chloride  3 mL Intravenous Q12H  . thiamine  100 mg Oral Daily   Or  . thiamine  100 mg Intravenous Daily   Continuous Infusions:    Principal Problem:   Sepsis Active Problems:   Hypoglycemia   Acute encephalopathy   Hypothermia   ETOH abuse   Metabolic acidosis   ARF (acute renal failure)   Anemia, chronic disease   Pancytopenia   Renal failure (ARF), acute on chronic   HTN (hypertension)    Time spent: 62 mins    Sahalie Beth M.D. [facility] Pager [phone]. If 7PM-7AM, please contact night-coverage at [email or web address], password [facility] 07/28/2013, 9:50  AM  LOS: 3 days

## 2013-07-28 NOTE — Clinical Social Work Placement (Addendum)
Clinical Social Work Department CLINICAL SOCIAL WORK PLACEMENT NOTE 07/28/2013  Patient:  BARAKA, KLATT  Account Number:  [id number] Admit date:  07/25/2013  Clinical Social Worker:  Nikie Cid Givens, LCSW  Date/time:  07/28/2013 03:07 AM  Clinical Social Work is seeking post-discharge placement for this patient at the following level of care:   SKILLED NURSING   (*CSW will update this form in Epic as items are completed)   07/28/2013  Patient/family provided with [facility] Department of Clinical Social Work's list of facilities offering this level of care within the geographic area requested by the patient (or if unable, by the patient's family).  07/28/2013  Patient/family informed of their freedom to choose among providers that offer the needed level of care, that participate in Medicare, Medicaid or managed care program needed by the patient, have an available bed and are willing to accept the patient.    Patient/family informed of [facility]' ownership interest in [facility], as well as of the fact that they are under no obligation to receive care at this facility.  PASARR submitted to EDS on 07/28/2013 PASARR number received from [employer] on 07/28/2013  FL2 transmitted to all facilities in geographic area requested by pt/family on  07/28/2013 FL2 transmitted to all facilities within larger geographic area on   Patient informed that his/her managed care company has contracts with or will negotiate with  certain facilities, including the following:     Patient/family informed of bed offers received:  07/28/2013 Patient chooses bed at [facility] Physician recommends and patient chooses bed at    Patient to be transferred to [facility] on 07/29/13  Patient to be transferred to facility by ambulance  The following physician request were entered in Epic:  Additional Comments: 07/28/13: CSW advised by charge nurse who spoke with MD that  patient not medically stable for discharge today. Admissions Director with [facility] notified. CSW will continue to follow and assist with discharge when ready.

## 2013-07-29 ENCOUNTER — Encounter (HOSPITAL_COMMUNITY): Payer: Self-pay | Admitting: Internal Medicine

## 2013-07-29 DIAGNOSIS — T68XXXA Hypothermia, initial encounter: Secondary | ICD-10-CM | POA: Diagnosis not present

## 2013-07-29 DIAGNOSIS — N179 Acute kidney failure, unspecified: Secondary | ICD-10-CM | POA: Diagnosis not present

## 2013-07-29 DIAGNOSIS — A419 Sepsis, unspecified organism: Secondary | ICD-10-CM | POA: Diagnosis not present

## 2013-07-29 DIAGNOSIS — Z79899 Other long term (current) drug therapy: Secondary | ICD-10-CM | POA: Diagnosis not present

## 2013-07-29 DIAGNOSIS — IMO0001 Reserved for inherently not codable concepts without codable children: Secondary | ICD-10-CM | POA: Diagnosis not present

## 2013-07-29 DIAGNOSIS — G9340 Encephalopathy, unspecified: Secondary | ICD-10-CM | POA: Diagnosis not present

## 2013-07-29 DIAGNOSIS — E119 Type 2 diabetes mellitus without complications: Secondary | ICD-10-CM | POA: Diagnosis not present

## 2013-07-29 DIAGNOSIS — R404 Transient alteration of awareness: Secondary | ICD-10-CM | POA: Diagnosis not present

## 2013-07-29 DIAGNOSIS — R6889 Other general symptoms and signs: Secondary | ICD-10-CM | POA: Diagnosis not present

## 2013-07-29 DIAGNOSIS — E162 Hypoglycemia, unspecified: Secondary | ICD-10-CM | POA: Diagnosis not present

## 2013-07-29 DIAGNOSIS — I10 Essential (primary) hypertension: Secondary | ICD-10-CM | POA: Diagnosis not present

## 2013-07-29 DIAGNOSIS — E118 Type 2 diabetes mellitus with unspecified complications: Secondary | ICD-10-CM | POA: Diagnosis not present

## 2013-07-29 DIAGNOSIS — R262 Difficulty in walking, not elsewhere classified: Secondary | ICD-10-CM | POA: Diagnosis not present

## 2013-07-29 DIAGNOSIS — M6281 Muscle weakness (generalized): Secondary | ICD-10-CM | POA: Diagnosis not present

## 2013-07-29 LAB — CBC
HCT: 28.3 % — ABNORMAL LOW (ref 39.0–52.0)
Hemoglobin: 9.9 g/dL — ABNORMAL LOW (ref 13.0–17.0)
MCH: 35.5 pg — ABNORMAL HIGH (ref 26.0–34.0)
MCHC: 35 g/dL (ref 30.0–36.0)
MCV: 101.4 fL — AB (ref 78.0–100.0)
PLATELETS: 128 10*3/uL — AB (ref 150–400)
RBC: 2.79 MIL/uL — ABNORMAL LOW (ref 4.22–5.81)
RDW: 13.6 % (ref 11.5–15.5)
WBC: 5.4 10*3/uL (ref 4.0–10.5)

## 2013-07-29 LAB — BASIC METABOLIC PANEL
BUN: 14 mg/dL (ref 6–23)
CALCIUM: 8.9 mg/dL (ref 8.4–10.5)
CO2: 22 mEq/L (ref 19–32)
CREATININE: 1.59 mg/dL — AB (ref 0.50–1.35)
Chloride: 98 mEq/L (ref 96–112)
GFR calc Af Amer: 51 mL/min — ABNORMAL LOW (ref >90)
GFR calc non Af Amer: 44 mL/min — ABNORMAL LOW (ref >90)
Glucose, Bld: 143 mg/dL — ABNORMAL HIGH (ref 70–99)
Potassium: 3.8 mEq/L (ref 3.7–5.3)
Sodium: 136 mEq/L — ABNORMAL LOW (ref 137–147)

## 2013-07-29 LAB — GLUCOSE, CAPILLARY
Glucose-Capillary: 139 mg/dL — ABNORMAL HIGH (ref 70–99)
Glucose-Capillary: 182 mg/dL — ABNORMAL HIGH (ref 70–99)

## 2013-07-29 MED ORDER — THIAMINE HCL 100 MG PO TABS: 100.0000 mg | ORAL_TABLET | Freq: Every day | ORAL | Status: DC

## 2013-07-29 MED ORDER — NICOTINE 21 MG/24HR TD PT24: 21.0000 mg | MEDICATED_PATCH | Freq: Every day | TRANSDERMAL | Status: DC

## 2013-07-29 MED ORDER — FOLIC ACID 1 MG PO TABS: 1.0000 mg | ORAL_TABLET | Freq: Every day | ORAL | Status: DC

## 2013-07-29 MED ORDER — LEVOFLOXACIN 250 MG PO TABS: 250.0000 mg | ORAL_TABLET | Freq: Every day | ORAL | Status: AC

## 2013-07-29 MED ORDER — GLIMEPIRIDE 2 MG PO TABS: 2.0000 mg | ORAL_TABLET | Freq: Every day | ORAL | Status: DC

## 2013-07-29 MED ORDER — ADULT MULTIVITAMIN W/MINERALS CH: 1.0000 | ORAL_TABLET | Freq: Every day | ORAL | Status: DC

## 2013-07-29 MED ORDER — LINAGLIPTIN 5 MG PO TABS: 5.0000 mg | ORAL_TABLET | Freq: Every day | ORAL | Status: DC

## 2013-07-29 MED ORDER — AMLODIPINE BESYLATE 10 MG PO TABS: 10.0000 mg | ORAL_TABLET | Freq: Every day | ORAL | Status: DC

## 2013-07-29 NOTE — Progress Notes (Signed)
Called report to [facility]. IV d/c'd.

## 2013-07-29 NOTE — Discharge Summary (Signed)
Physician Discharge Summary  Shiv Lauer Dhondt [id number] DOB: [birth year] DOA: 07/25/2013  PCP: No PCP Per Patient  Admit date: 07/25/2013 Discharge date: 07/29/2013  Time spent: 65 minutes  Recommendations for Outpatient Follow-up:  1. Patient will need to be set up with PCP will need to followup one week post discharge for further management of his diabetes and his hypertension and chronic medical issues. Patient will need a basic metabolic profile done to followup on his electrolytes and renal function. 2. Patient need to followup with M.D. at the skilled nursing facility. 3. Patient needs to eat everyday prior to being given amaryl for his diabetes.  Discharge Diagnoses:  Principal Problem:   Sepsis Active Problems:   Hypoglycemia   Acute encephalopathy   Hypothermia   ETOH abuse   Metabolic acidosis   ARF (acute renal failure)   Anemia, chronic disease   Pancytopenia   Renal failure (ARF), acute on chronic   HTN (hypertension)   Type II or unspecified type diabetes mellitus without mention of complication, not stated as uncontrolled   Discharge Condition: Stable and improved  Diet recommendation: Carb modified  Filed Weights   07/25/13 1700 07/27/13 2051 07/28/13 2108  Weight: 68.04 kg (150 lb) 67.268 kg (148 lb 4.8 oz) 66.18 kg (145 lb 14.4 oz)    History of present illness:  Robert Sparks is a 68 y.o. male  With history of tobacco abuse, prior history of diabetes per patient has been off medications for several years, history of alcohol abuse presented to the ED unresponsive. Patient is not quite sure what happened however states that was drinking the night prior to admission he drank about 2-340 ounce beers and drank close to half a pint of vodka mixed with water. Patient stated that his house mate found him unresponsive and subsequently EMS was called. Per ED report when EMS got that patient's CBGs were 20 was given some D50 and glucagon and his mental status  improved and return back to his baseline.  Patient denies any fevers, no nausea, no vomiting, no abdominal pain, no chest pain, no shortness of breath, no dysuria, no diarrhea, no cough. Patient does endorse some chills, generalized weakness, occasional constipation.  Patient was seen in the emergency room chest x-ray which was done was unremarkable. Vitals on admission patient was noted to be hypothermic with a temperature of 91. Comprehensive metabolic profile obtained at a sodium of 134 chloride of 91 bicarbonate of 9 creatinine of 2.04 glucose of 162 albumin of 3.3 AST of 41 otherwise was within normal limits. Initial lactic acid level was 9.0 to and then 7.95. Total CK was 68. CBC had a hemoglobin of 12.0 otherwise was within normal limits. Urinalysis which was done was cloudy nitrite negative leukocytes negative. Alcohol level was 114. Patient was given 3 L of normal saline. Blood cultures were obtained. Patient was placed on IV vancomycin IV Zosyn.  We were called to admit the patient for further evaluation and management   Hospital Course:  #1 sepsis  Questionable etiology. Lactic acid level was elevated on admission, patient also noted to be hypothermic. Patient was pan cultured. Chest x-ray was negative for any acute infection. Urine cultures are neg. patient was initially admitted to the step down unit where he was placed on a bear hugger and started empirically on IV vancomycin and IV Zosyn. The patient's temperature improved and his hypothermia resolved. Patient improved clinically his lactic acid level of calcitonin levels trending down. Patient was subsequently transitioned  to oral Levaquin to be discharged on 3 more days of oral Levaquin to complete a one-week course of antibiotic therapy. Patient will be discharged in stable and improved condition.  #2 metabolic acidosis/lactic acidosis  Questionable etiology. May be secondary to alcohol intoxication versus starvation. Lactic acid was  elevated on admission and patient initially placed on a bicarbonate drip. Patient was pan cultured and placed empirically on IV vancomycin and Zosyn. Lactic acid levels trended down. Metabolic acidosis is resolved. Bicarbonate drip was discontinued. Patient was maintained on IV fluids. She was also on empiric IV antibiotics with transitioned to oral antibiotics. Patient be discharged on 3 more days of oral antibiotics to complete a course of metabolic therapy.  #3 acute on chronic kidney disease  On admission patient was noted to have an elevated creatinine of 2.04. Patient had no prior records and a such patient's acute renal failure was worked up. FENA = 3.91%. Abdominal ultrasound neg for hydronephrosis. Likely acute on CKD. Patient's renal function trending down with hydration and seemed to have plateaued around 1.6. Patient was making good urine output. Was felt patient likely a chronic kidney disease. IV fluids with saline lock. The patient was discharged in stable and improved condition. On day of discharge patient's creatinine was 1.59. #4 hypothermia  Questionable etiology. Likely secondary to hypoglycemia versus infectious etiology. Lactic acid level was elevated on admission. Patient was placed on the bear hugger. Patient was pan cultured and placed on empiric IV vancomycin and Zosyn. Patient's hypothermia resolved. Blood cultures with no growth today. Chest x-ray was negative. Urinalysis was negative. Patient's CBGs improved. Patient was transitioned from IV antibiotics to oral Levaquin and will complete a course of therapy on discharge. #5 hypoglycemia  Likely secondary to poor oral intake and alcohol use. Patient was not on any hypoglycemic agents. Hemoglobin A1c 5.5 with repeat A1c at 5.9. Patient was hydrated with IV fluids treated empirically with IV antibiotics for possible infection and pancultured with no growth to date. Patient's blood sugars improved and hypoglycemia had resolved by day  of discharge.  #6 history of alcohol abuse  On admission patient was noted to have a history of alcohol abuse. Patient was placed on the  Ativan CIWA protocol. Patient was maintained on thiamine, folic acid, multivitamin. Patient did not have any withdrawal episodes home be discharged in stable and improved condition. #7 tobacco abuse  Tobacco cessation. Patient was placed on nicotine patch.  #8 dehydration  Improved with IV fluids. Patient was euvolemic by day of discharge. #9 pancytopenia/macrocytic anemia/AOCD  Likely secondary to chronic alcohol abuse. Patient with no overt GI bleed. Anemia panel c/w AOCD. Continued on thiamine and folic acid.  #10 acute encephalopathy  Likely secondary to hypoglycemic episode. Improved and back to baseline.  #11 history of diabetes 2 prob diet controlled.  Hemoglobin A1c was 5.5. Repeat A1C 5.9. CBGs have ranged from 100s to the 400s. Patient was subsequently started on a sliding scale insulin and placed on low-dose Lantus 5 units daily. Patient's blood sugars improved and ranged from 139-174. Patient be discharged on a low-dose of amaryl  2mg  daily. Patient will also be set up for outpatient diabetes education. Patient will need to be set up with PCP to followup as outpatient for further management of his diabetes.  #12 HTN  During the hospitalization patient noted to be hypertensive. Patient was started on Norvasc with better blood pressure control. Patient is to followup as outpatient.      Procedures: Chest x-ray 07/25/2013  Abdominal US 07/26/13     Consultations: PCCM: Dr. Erlene Senters 07/25/13     Discharge Exam: Filed Vitals:   07/29/13 1003  BP: 128/81  Pulse: 98  Temp: 99.9 F (37.7 C)  Resp: 18    General: NAD Cardiovascular: RRR Respiratory: CTAB  Discharge Instructions      Discharge Orders   Future Appointments Provider Department Dept Phone   09/09/2013 3:00 PM Anne Shutter, [place] [phone]   Future Orders Complete By Expires   Ambulatory referral to Nutrition and Diabetic Education  As directed    Comments:     Patient with history of Type 2 DM.  Off medications for years.  Going to SNF for ST Rehab but then will need further DM education after d/c from SNF.  Please contact patient in ~3-4 weeks after d/c from hospital.   Diet Carb Modified  As directed    Discharge instructions  As directed    Comments:     Follow up with MD at SNF. Patient will need to be set up with PCP on discharge from SNF and needs to follow up in 1 week. Outpatient diabetes education. Stop drinking.   Increase activity slowly  As directed        Medication List         amLODipine 10 MG tablet  Commonly known as:  NORVASC  Take 1 tablet (10 mg total) by mouth daily.     folic acid 1 MG tablet  Commonly known as:  FOLVITE  Take 1 tablet (1 mg total) by mouth daily.     glimepiride 2 MG tablet  Commonly known as:  AMARYL  Take 1 tablet (2 mg total) by mouth daily with breakfast.     levofloxacin 250 MG tablet  Commonly known as:  LEVAQUIN  Take 1 tablet (250 mg total) by mouth daily. Take for 3 days then stop.  Start taking on:  07/30/2013     multivitamin with minerals Tabs tablet  Take 1 tablet by mouth daily.     nicotine 21 mg/24hr patch  Commonly known as:  NICODERM CQ - dosed in mg/24 hours  Place 1 patch (21 mg total) onto the skin daily.     thiamine 100 MG tablet  Take 1 tablet (100 mg total) by mouth daily.       No Known Allergies Follow-up Information   Schedule an appointment as soon as possible for a visit in 1 week to follow up. (Patient will need to follow up with a PCP)       Please follow up. (f/u with MD at [facility])        The results of significant diagnostics from this hospitalization (including imaging, microbiology, ancillary and laboratory) are listed below for reference.    Significant Diagnostic Studies: US  Abdomen Complete  07/26/2013   CLINICAL DATA:  Acute renal failure. Ethanol abuse. Sepsis. Acute encephalopathy.  EXAM: ULTRASOUND ABDOMEN COMPLETE  COMPARISON:  None.  FINDINGS: Gallbladder:  No gallstones or wall thickening visualized. No sonographic Murphy sign noted.  Common bile duct:  Diameter: 6 mm  Liver:  No focal lesion identified. Within normal limits in parenchymal echogenicity.  IVC:  No abnormality visualized.  Pancreas:  Visualized portion unremarkable.  Spleen:  Size and appearance within normal limits.  Right Kidney:  Length: 10.2 cm. Echogenicity within normal limits. No mass or hydronephrosis visualized.  Left Kidney:  Length: 10.5 cm. Echogenicity within  normal limits. No mass or hydronephrosis visualized.  Abdominal aorta:  No aneurysm visualized.  Other findings:  None.  IMPRESSION: No gallstones visualized. No evidence of hydronephrosis or other acute findings.   Electronically Signed   By: Earle Gell M.D.   On: 07/26/2013 12:17   Dg Chest Portable 1 View  07/25/2013   CLINICAL DATA:  Hypoglycemia  EXAM: PORTABLE CHEST - 1 VIEW  COMPARISON:  None.  FINDINGS: The heart size and mediastinal contours are within normal limits. Both lungs are clear. The visualized skeletal structures are unremarkable.  IMPRESSION: No active disease.   Electronically Signed   By: Franchot Gallo M.D.   On: 07/25/2013 13:29    Microbiology: Recent Results (from the past 240 hour(s))  CULTURE, BLOOD (ROUTINE X 2)     Status: None   Collection Time    07/25/13  1:25 PM      Result Value Range Status   Specimen Description BLOOD HAND RIGHT   Final   Special Requests BOTTLES DRAWN AEROBIC ONLY 5CC   Final   Culture  Setup Time     Final   Value: 07/25/2013 16:58     Performed at [facility]   Culture     Final   Value:        BLOOD CULTURE RECEIVED NO GROWTH TO DATE CULTURE WILL BE HELD FOR 5 DAYS BEFORE ISSUING A FINAL NEGATIVE REPORT     Performed at [facility]   Report Status  PENDING   Incomplete  CULTURE, BLOOD (ROUTINE X 2)     Status: None   Collection Time    07/25/13  1:30 PM      Result Value Range Status   Specimen Description BLOOD HAND LEFT   Final   Special Requests BOTTLES DRAWN AEROBIC AND ANAEROBIC 10CC   Final   Culture  Setup Time     Final   Value: 07/25/2013 16:58     Performed at [facility]   Culture     Final   Value:        BLOOD CULTURE RECEIVED NO GROWTH TO DATE CULTURE WILL BE HELD FOR 5 DAYS BEFORE ISSUING A FINAL NEGATIVE REPORT     Performed at [facility]   Report Status PENDING   Incomplete  MRSA PCR SCREENING     Status: None   Collection Time    07/25/13  5:00 PM      Result Value Range Status   MRSA by PCR NEGATIVE  NEGATIVE Final   Comment:            The GeneXpert MRSA Assay (FDA     approved for NASAL specimens     only), is one component of a     comprehensive MRSA colonization     surveillance program. It is not     intended to diagnose MRSA     infection nor to guide or     monitor treatment for     MRSA infections.  URINE CULTURE     Status: None   Collection Time    07/25/13  5:26 PM      Result Value Range Status   Specimen Description URINE, CLEAN CATCH   Final   Special Requests NONE   Final   Culture  Setup Time     Final   Value: 07/25/2013 23:04     Performed at [place]  Final   Value: NO GROWTH     Performed at [facility]   Culture     Final   Value: NO GROWTH     Performed at [facility]   Report Status 07/26/2013 FINAL   Final  CLOSTRIDIUM DIFFICILE BY PCR     Status: None   Collection Time    07/27/13  9:50 AM      Result Value Range Status   C difficile by pcr NEGATIVE  NEGATIVE Final     Labs: Basic Metabolic Panel:  Recent Labs Lab 07/25/13 1248 07/25/13 1815  07/26/13 0545 07/26/13 1510 07/27/13 0235 07/28/13 0455 07/29/13 0518  NA 134* 134*  < > 135* 135* 138 137 136*  K 4.5 5.1  < > 4.3 3.9 3.8 3.9 3.8   CL 91* 98  < > 96 94* 98 97 98  CO2 9* 8*  < > 22 18* 22 19 22   GLUCOSE 162* 130*  < > 153* 172* 174* 240* 143*  BUN 18 17  < > 17 16 14 11 14   CREATININE 2.04* 1.66*  < > 1.68* 1.75* 1.69* 1.55* 1.59*  CALCIUM 9.1 8.1*  < > 8.0* 8.3* 8.5 9.0 8.9  MG  --  1.9  --   --   --   --   --   --   < > = values in this interval not displayed. Liver Function Tests:  Recent Labs Lab 07/25/13 1248 07/25/13 2307 07/26/13 0545  AST 41* 30 31  ALT 32 24 23  ALKPHOS 47 37* 35*  BILITOT 0.4 0.5 0.6  PROT 8.3 7.1 7.1  ALBUMIN 3.3* 2.9* 2.8*   No results found for this basename: LIPASE, AMYLASE,  in the last 168 hours No results found for this basename: AMMONIA,  in the last 168 hours CBC:  Recent Labs Lab 07/25/13 1248 07/25/13 1815 07/26/13 0545 07/27/13 0235 07/28/13 0455 07/29/13 0518  WBC 7.3 5.2 3.9* 4.5 6.0 5.4  NEUTROABS 5.9  --   --   --   --   --   HGB 12.0* 10.2* 10.5* 10.4* 10.6* 9.9*  HCT 35.5* 30.4* 29.6* 29.4* 30.2* 28.3*  MCV 105.7* 105.2* 100.3* 100.3* 101.7* 101.4*  PLT 174 141* 135* 130* 122* 128*   Cardiac Enzymes:  Recent Labs Lab 07/25/13 1524 07/25/13 1815  CKTOTAL 68  --   TROPONINI  --  <0.30   BNP: BNP (last 3 results) No results found for this basename: PROBNP,  in the last 8760 hours CBG:  Recent Labs Lab 07/28/13 1136 07/28/13 1720 07/28/13 2105 07/29/13 0747 07/29/13 1106  GLUCAP 184* 174* 173* 139* 182*       Signed:  THOMPSON,DANIEL MD [facility] 07/29/2013, 11:59 AM

## 2013-07-29 NOTE — Care Management Note (Signed)
   CARE MANAGEMENT NOTE 07/29/2013  Patient:  Robert Sparks, Robert Sparks   Account Number:  [id number]  Date Initiated:  07/28/2013  Documentation initiated by:  Jasmine Pang  Subjective/Objective Assessment:   Noted referral for PCP and transportation     Action/Plan:   Spoke with Dr Grandville Silos and explained that Pt medical needs will be managed at SNF by MD providing services at the facility . When the pt d/c from the SNF he can ask for assistance with setting up at PCP by the facility social worker.   Anticipated DC Date:  07/29/2013   Anticipated DC Plan:  SKILLED NURSING FACILITY         Choice offered to / List presented to:             Status of service:  Completed, signed off Medicare Important Message given?   (If response is "NO", the following Medicare IM given date fields will be blank) Date Medicare IM given:   Date Additional Medicare IM given:    Discharge Disposition:  [place]  Per UR Regulation:    If discussed at Long Length of Stay Meetings, dates discussed:    Comments:   07/29/2013 Met with pt and discussed medication coverage, pt does not have Medicare part D or any other medication benefit program. As pt has not had to fill prescriptions previously he has no coverage and has not established a pharmacy. Additionally the pt does not have a PCP, therefore this CM contacted [facility] and spoke with admission coordinator, Levada Dy who varifed that the social worker at the facility would set this pt up with a PCP prior to d/c from the facility. They will also attempt to assist this pt with a plan to obtain medications after d/c from the SNF. Dr Cline Cools notifed of this so that he may adjust medications as he deems appropriate. Jasmine Pang RN MPH, case manager, [phone]   07/28/2013 Spoke with Dr Grandville Silos and explained that this pt will have PCP services provided at the SNF and that transportation to a PCP will not be necessary as the PCP at his facility will  see the pt at the facility. At the time of d/c from the SNF the SW may be able to assist with a PCP, or the pt may be able to arrange his own appointment as he has insurance . Jasmine Pang RN MPH, case manager, [phone]

## 2013-07-29 NOTE — Progress Notes (Signed)
Physical Therapy Treatment Patient Details Name: Robert Sparks MRN: [id number] DOB: 1946-01-15 Today's Date: 07/29/2013 Time: 1308-6578 PT Time Calculation (min): 8 min  PT Assessment / Plan / Recommendation  History of Present Illness With history of tobacco abuse, prior history of diabetes per patient has been off medications for several years, history of alcohol abuse presented to the ED unresponsive. Patient is not quite sure what happened however states that was drinking the night prior to admission he drank about 2-340 ounce beers and drank close to half a pint of vodka mixed with water. Patient stated that his house mate found him unresponsive and subsequently EMS was called. Per ED report when EMS got that patient's CBGs were 20 was given some D50 and glucagon and his mental status improved and return back to his baseline   PT Comments   Pt making steady progress with mobility.  Follow Up Recommendations  SNF;Supervision for mobility/OOB     Equipment Recommendations  Rolling walker with 5" wheels    Frequency Min 3X/week   Progress towards PT Goals Progress towards PT goals: Progressing toward goals  Plan Current plan remains appropriate    Precautions / Restrictions Precautions Precautions: Fall Restrictions Weight Bearing Restrictions: No       Mobility  Bed Mobility Overal bed mobility: Modified Independent Transfers Overall transfer level: Needs assistance Equipment used: None Transfers: Sit to/from Stand Sit to Stand: Min guard General transfer comment: cues on safety and hand placement with transfers. Ambulation/Gait Ambulation/Gait assistance: Min guard Ambulation Distance (Feet): 600 Feet Assistive device: None Gait Pattern/deviations: Step-through pattern;Decreased stride length;Narrow base of support Gait velocity interpretation: Below normal speed for age/gender       PT Goals (current goals can now be found in the care plan section) Acute Rehab  PT Goals Patient Stated Goal: be able to return home PT Goal Formulation: With patient Time For Goal Achievement: 08/09/13 Potential to Achieve Goals: Good Additional Goals Additional Goal #1: Pt will score >19 on DGI to demonstrate decreased fall risk  Visit Information  Last PT Received On: 07/29/13 Assistance Needed: +1 History of Present Illness: With history of tobacco abuse, prior history of diabetes per patient has been off medications for several years, history of alcohol abuse presented to the ED unresponsive. Patient is not quite sure what happened however states that was drinking the night prior to admission he drank about 2-340 ounce beers and drank close to half a pint of vodka mixed with water. Patient stated that his house mate found him unresponsive and subsequently EMS was called. Per ED report when EMS got that patient's CBGs were 20 was given some D50 and glucagon and his mental status improved and return back to his baseline    Subjective Data  Patient Stated Goal: be able to return home   Cognition  Cognition Arousal/Alertness: Awake/alert Behavior During Therapy: WFL for tasks assessed/performed Overall Cognitive Status: Within Functional Limits for tasks assessed       End of Session PT - End of Session Equipment Utilized During Treatment: Gait belt Activity Tolerance: Patient tolerated treatment well Patient left: in bed;with bed alarm set Nurse Communication: Mobility status   GP     Willow Ora 07/29/2013, 4:41 PM  Willow Ora, PTA Office- [phone]

## 2013-07-31 LAB — CULTURE, BLOOD (ROUTINE X 2)
CULTURE: NO GROWTH
Culture: NO GROWTH

## 2013-08-01 ENCOUNTER — Non-Acute Institutional Stay (SKILLED_NURSING_FACILITY): Payer: Medicare Other | Admitting: Internal Medicine

## 2013-08-01 DIAGNOSIS — IMO0001 Reserved for inherently not codable concepts without codable children: Secondary | ICD-10-CM

## 2013-08-01 DIAGNOSIS — R404 Transient alteration of awareness: Secondary | ICD-10-CM | POA: Diagnosis not present

## 2013-08-01 DIAGNOSIS — A419 Sepsis, unspecified organism: Secondary | ICD-10-CM | POA: Diagnosis not present

## 2013-08-01 DIAGNOSIS — E1165 Type 2 diabetes mellitus with hyperglycemia: Secondary | ICD-10-CM

## 2013-08-01 NOTE — Progress Notes (Signed)
Patient ID: Robert Sparks, male   DOB: 08-16-1945, 68 y.o.   MRN: [id number] [facility] Chief complaint admission to SNF post [facility] 1/16 through 1/20 History this is a 68 year old man who lives independently with her friend he tells me he got sick overnight and was admitted to hospital unresponsive to appear on admission he had severe metabolic acidosis with an elevated lactate level. He was also noted to be hypothermic care. He was pan cultured a chest x-ray was negative urine culture was negative he was given empiric antibiotics in the form of bank and Zosyn. He was also giving warming blanket and his hypothermia resolved . Although no cause of it the cause of infection was identified he was transitioned to oral Leviquin.   It was noted that the patient admitted when he was aroused drinking 40 ounces of alcohol a day. His lab work showed a bicarbonate of 9 creatinine of 2.04 glucose of 162 albumin of 3.3 AST was 41 ALT was within the normal limits. His CK was 68 his hemoglobin was 12 white count was normal per alcohol level was 114. He received IV rehydration and his creatinine came down to 1.6. Was felt his hypothermia was secondary to hypoglycemia vs. infection  At home his type 2 diabetes was diet controlled. His hemoglobin A1c was 5.5 the. CBGs in the hospital ranged from 100-400. He was started on a low-dose of Lantus and sliding scale insulin. Blood sugars improved. He was discharged on amaryl.  Past Medical History  Diagnosis Date  . ETOH abuse   . GERD (gastroesophageal reflux disease)   . Type II or unspecified type diabetes mellitus without mention of complication, not stated as uncontrolled 07/29/2013   Current Outpatient Prescriptions on File Prior to Visit  Medication Sig Dispense Refill  . amLODipine (NORVASC) 10 MG tablet Take 1 tablet (10 mg total) by mouth daily.  31 tablet  0  . folic acid (FOLVITE) 1 MG tablet Take 1 tablet (1 mg total) by mouth daily.      [name]  glimepiride (AMARYL) 2 MG tablet Take 1 tablet (2 mg total) by mouth daily with breakfast.  31 tablet  0  . levofloxacin (LEVAQUIN) 250 MG tablet Take 1 tablet (250 mg total) by mouth daily. Take for 3 days then stop.  3 tablet  0  . Multiple Vitamin (MULTIVITAMIN WITH MINERALS) TABS tablet Take 1 tablet by mouth daily.      . nicotine (NICODERM CQ - DOSED IN MG/24 HOURS) 21 mg/24hr patch Place 1 patch (21 mg total) onto the skin daily.  28 patch  0  . thiamine 100 MG tablet Take 1 tablet (100 mg total) by mouth daily.       No current facility-administered medications on file prior to visit.   Social:  patient tells me he lives in a home he rents with a friend. He drinks heavily i.e. 40 ounces per day he also smokes. He does not drive various people drive him to the grocery store. He claims to be independent with ADLs and IADLs. Still manages his own finances. He has no children never married he does have brothers and sisters not sure of the involvement  reports that he has been smoking Cigarettes.  He has been smoking about 0.50 packs per day. He does not have any smokeless tobacco history on file. He reports that he drinks alcohol.  No family history on file.   Review of systems Respiratory he is not complaining  of cough or shortness of breath Cardiac no exertional chest pain GI no dysphagia no abdominal pain no change in bowel habits GU no dysuria Musculoskeletal he is complaining of bilateral foot pain  Physical Exam Gen. the patient looks remarkably well Vitals pulse rate 82 respirations 18 free and it easy and unlabored. Respiratory clear entry bilaterally Cardiac heart sounds are normal there is no murmurs no gallops. Abdomen no liver no spleen no stigmata of chronic liver disease Extremities no edema. In his peripheral pulses are palpable in his feet there is no evidence of an active arthritis Neurologic gait and he gets up and walks on his own gait is nondescript and  steady. Mental status I see no particular abnormalities  Impression/plan #1 patient arrived in the hospital unresponsive for a period he was severely acidotic secondary to lactic acidosis, hypothermic and in some degree of acute renal insufficiency. Although this appears to have resolved. He was treated with with a week's worth of antibiotics [facility] [employer] and Zosyn and then Levaquin however all of this appears to resolved and there was no clear source of infection identified one would wonder about other causes of his metabolic acidosis over his lactic acidosis was elevated #2 type 2 diabetes on oral agents. His blood sugars are ranging here from the high 100s in the morning to high 200s the over 300 a.c. dinner. Was transitioned from insulin to oral agents in the hospital, I am doubtful that this man would give the insulin reliably in any case . I will try to increase the Amaryl for now. Although his hemoglobin A1c was low on arrival to hospital his blood sugars both in the hospital and here appeared to be quite elevated #3 hypertension we'll need to monitor this while he is here #4 alcoholism patient seems aware that his drinking was excessive however I am uncertain about his desire to really cut down on this. #5 tobacco abuse without overt sequelae for the moment  I am doubtful that the patient's stay here will be all that long. I will check his lab work again next week including his creatinine. Will gently try to increase the Amaryl.

## 2013-08-12 DIAGNOSIS — E1149 Type 2 diabetes mellitus with other diabetic neurological complication: Secondary | ICD-10-CM | POA: Diagnosis not present

## 2013-08-12 DIAGNOSIS — I129 Hypertensive chronic kidney disease with stage 1 through stage 4 chronic kidney disease, or unspecified chronic kidney disease: Secondary | ICD-10-CM

## 2013-08-12 DIAGNOSIS — N189 Chronic kidney disease, unspecified: Secondary | ICD-10-CM

## 2013-08-12 DIAGNOSIS — D649 Anemia, unspecified: Secondary | ICD-10-CM

## 2013-08-12 DIAGNOSIS — E1142 Type 2 diabetes mellitus with diabetic polyneuropathy: Secondary | ICD-10-CM | POA: Diagnosis not present

## 2013-08-12 DIAGNOSIS — F1020 Alcohol dependence, uncomplicated: Secondary | ICD-10-CM

## 2013-08-26 DIAGNOSIS — E161 Other hypoglycemia: Secondary | ICD-10-CM | POA: Diagnosis not present

## 2013-08-26 DIAGNOSIS — R52 Pain, unspecified: Secondary | ICD-10-CM | POA: Diagnosis not present

## 2013-09-04 ENCOUNTER — Ambulatory Visit: Payer: Medicare Other | Admitting: *Deleted

## 2013-09-08 DIAGNOSIS — I10 Essential (primary) hypertension: Secondary | ICD-10-CM | POA: Diagnosis not present

## 2013-09-08 DIAGNOSIS — E119 Type 2 diabetes mellitus without complications: Secondary | ICD-10-CM | POA: Diagnosis not present

## 2013-09-08 DIAGNOSIS — F17200 Nicotine dependence, unspecified, uncomplicated: Secondary | ICD-10-CM | POA: Diagnosis not present

## 2013-09-08 DIAGNOSIS — E1142 Type 2 diabetes mellitus with diabetic polyneuropathy: Secondary | ICD-10-CM | POA: Diagnosis not present

## 2013-09-08 DIAGNOSIS — E291 Testicular hypofunction: Secondary | ICD-10-CM | POA: Diagnosis not present

## 2013-09-08 DIAGNOSIS — Z1289 Encounter for screening for malignant neoplasm of other sites: Secondary | ICD-10-CM | POA: Diagnosis not present

## 2013-09-08 DIAGNOSIS — R5381 Other malaise: Secondary | ICD-10-CM | POA: Diagnosis not present

## 2013-09-08 DIAGNOSIS — R972 Elevated prostate specific antigen [PSA]: Secondary | ICD-10-CM | POA: Diagnosis not present

## 2013-09-08 DIAGNOSIS — R5383 Other fatigue: Secondary | ICD-10-CM | POA: Diagnosis not present

## 2013-09-08 DIAGNOSIS — Z0000 Encounter for general adult medical examination without abnormal findings: Secondary | ICD-10-CM | POA: Diagnosis not present

## 2013-09-08 DIAGNOSIS — N390 Urinary tract infection, site not specified: Secondary | ICD-10-CM | POA: Diagnosis not present

## 2013-09-09 ENCOUNTER — Ambulatory Visit: Payer: Medicare Other | Admitting: *Deleted

## 2013-09-23 DIAGNOSIS — F17200 Nicotine dependence, unspecified, uncomplicated: Secondary | ICD-10-CM | POA: Diagnosis not present

## 2013-09-23 DIAGNOSIS — N19 Unspecified kidney failure: Secondary | ICD-10-CM | POA: Diagnosis not present

## 2013-09-23 DIAGNOSIS — E039 Hypothyroidism, unspecified: Secondary | ICD-10-CM | POA: Diagnosis not present

## 2013-09-23 DIAGNOSIS — E119 Type 2 diabetes mellitus without complications: Secondary | ICD-10-CM | POA: Diagnosis not present

## 2013-09-23 DIAGNOSIS — E559 Vitamin D deficiency, unspecified: Secondary | ICD-10-CM | POA: Diagnosis not present

## 2013-09-23 DIAGNOSIS — R972 Elevated prostate specific antigen [PSA]: Secondary | ICD-10-CM | POA: Diagnosis not present

## 2013-09-23 DIAGNOSIS — I10 Essential (primary) hypertension: Secondary | ICD-10-CM | POA: Diagnosis not present

## 2013-09-23 DIAGNOSIS — E785 Hyperlipidemia, unspecified: Secondary | ICD-10-CM | POA: Diagnosis not present

## 2015-03-29 ENCOUNTER — Inpatient Hospital Stay (HOSPITAL_COMMUNITY): Payer: Medicare Other

## 2015-03-29 ENCOUNTER — Encounter (HOSPITAL_COMMUNITY): Payer: Self-pay | Admitting: *Deleted

## 2015-03-29 ENCOUNTER — Inpatient Hospital Stay (HOSPITAL_COMMUNITY)
Admission: EM | Admit: 2015-03-29 | Discharge: 2015-04-01 | DRG: 639 | Disposition: A | Discharge status: Home / Self Care | Payer: Medicare Other | Attending: Family Medicine | Admitting: Family Medicine

## 2015-03-29 DIAGNOSIS — Z794 Long term (current) use of insulin: Secondary | ICD-10-CM

## 2015-03-29 DIAGNOSIS — F17210 Nicotine dependence, cigarettes, uncomplicated: Secondary | ICD-10-CM | POA: Diagnosis present

## 2015-03-29 DIAGNOSIS — D649 Anemia, unspecified: Secondary | ICD-10-CM | POA: Diagnosis present

## 2015-03-29 DIAGNOSIS — I129 Hypertensive chronic kidney disease with stage 1 through stage 4 chronic kidney disease, or unspecified chronic kidney disease: Secondary | ICD-10-CM | POA: Diagnosis present

## 2015-03-29 DIAGNOSIS — N183 Chronic kidney disease, stage 3 unspecified: Secondary | ICD-10-CM | POA: Diagnosis present

## 2015-03-29 DIAGNOSIS — Z9119 Patient's noncompliance with other medical treatment and regimen: Secondary | ICD-10-CM | POA: Diagnosis present

## 2015-03-29 DIAGNOSIS — K219 Gastro-esophageal reflux disease without esophagitis: Secondary | ICD-10-CM | POA: Diagnosis present

## 2015-03-29 DIAGNOSIS — M2010 Hallux valgus (acquired), unspecified foot: Secondary | ICD-10-CM | POA: Diagnosis present

## 2015-03-29 DIAGNOSIS — E1142 Type 2 diabetes mellitus with diabetic polyneuropathy: Secondary | ICD-10-CM | POA: Diagnosis present

## 2015-03-29 DIAGNOSIS — F1010 Alcohol abuse, uncomplicated: Secondary | ICD-10-CM | POA: Diagnosis present

## 2015-03-29 DIAGNOSIS — E119 Type 2 diabetes mellitus without complications: Secondary | ICD-10-CM | POA: Diagnosis not present

## 2015-03-29 DIAGNOSIS — R059 Cough, unspecified: Secondary | ICD-10-CM

## 2015-03-29 DIAGNOSIS — E1310 Other specified diabetes mellitus with ketoacidosis without coma: Secondary | ICD-10-CM | POA: Diagnosis not present

## 2015-03-29 DIAGNOSIS — E872 Acidosis: Secondary | ICD-10-CM | POA: Diagnosis not present

## 2015-03-29 DIAGNOSIS — R05 Cough: Secondary | ICD-10-CM | POA: Diagnosis not present

## 2015-03-29 DIAGNOSIS — H547 Unspecified visual loss: Secondary | ICD-10-CM | POA: Diagnosis present

## 2015-03-29 DIAGNOSIS — E1122 Type 2 diabetes mellitus with diabetic chronic kidney disease: Secondary | ICD-10-CM | POA: Diagnosis present

## 2015-03-29 DIAGNOSIS — E1110 Type 2 diabetes mellitus with ketoacidosis without coma: Secondary | ICD-10-CM | POA: Diagnosis present

## 2015-03-29 LAB — URINE MICROSCOPIC-ADD ON

## 2015-03-29 LAB — BASIC METABOLIC PANEL
Anion gap: 17 — ABNORMAL HIGH (ref 5–15)
Anion gap: 14 (ref 5–15)
BUN: 17 mg/dL (ref 6–20)
BUN: 19 mg/dL (ref 6–20)
CO2: 20 mmol/L — ABNORMAL LOW (ref 22–32)
CO2: 21 mmol/L — ABNORMAL LOW (ref 22–32)
Calcium: 10.4 mg/dL — ABNORMAL HIGH (ref 8.9–10.3)
Calcium: 9.4 mg/dL (ref 8.9–10.3)
Chloride: 98 mmol/L — ABNORMAL LOW (ref 101–111)
Chloride: 100 mmol/L — ABNORMAL LOW (ref 101–111)
Creatinine, Ser: 1.92 mg/dL — ABNORMAL HIGH (ref 0.61–1.24)
Creatinine, Ser: 1.75 mg/dL — ABNORMAL HIGH (ref 0.61–1.24)
GFR calc Af Amer: 40 mL/min — ABNORMAL LOW (ref >60)
GFR calc Af Amer: 45 mL/min — ABNORMAL LOW (ref >60)
GFR, EST NON AFRICAN AMERICAN: 34 mL/min — AB (ref >60)
GFR, EST NON AFRICAN AMERICAN: 39 mL/min — AB (ref >60)
GLUCOSE: 229 mg/dL — AB (ref 65–99)
Glucose, Bld: 297 mg/dL — ABNORMAL HIGH (ref 65–99)
POTASSIUM: 4.1 mmol/L (ref 3.5–5.1)
Potassium: 5.3 mmol/L — ABNORMAL HIGH (ref 3.5–5.1)
SODIUM: 135 mmol/L (ref 135–145)
Sodium: 135 mmol/L (ref 135–145)

## 2015-03-29 LAB — URINALYSIS, ROUTINE W REFLEX MICROSCOPIC
Glucose, UA: >1000 mg/dL — AB
KETONES UR: 15 mg/dL — AB
Leukocytes, UA: NEGATIVE
NITRITE: NEGATIVE
PH: 5 (ref 5.0–8.0)
Protein, ur: >300 mg/dL — AB
Specific Gravity, Urine: 1.022 (ref 1.005–1.030)
Urobilinogen, UA: 1 mg/dL (ref 0.0–1.0)

## 2015-03-29 LAB — I-STAT CHEM 8, ED
BUN: 22 mg/dL — ABNORMAL HIGH (ref 6–20)
CALCIUM ION: 1.19 mmol/L (ref 1.13–1.30)
Chloride: 101 mmol/L (ref 101–111)
Creatinine, Ser: 1.7 mg/dL — ABNORMAL HIGH (ref 0.61–1.24)
GLUCOSE: 261 mg/dL — AB (ref 65–99)
HCT: 38 % — ABNORMAL LOW (ref 39.0–52.0)
HEMOGLOBIN: 12.9 g/dL — AB (ref 13.0–17.0)
Potassium: 4.8 mmol/L (ref 3.5–5.1)
SODIUM: 136 mmol/L (ref 135–145)
TCO2: 21 mmol/L (ref 0–100)

## 2015-03-29 LAB — CBC
HCT: 35.2 % — ABNORMAL LOW (ref 39.0–52.0)
Hemoglobin: 12.3 g/dL — ABNORMAL LOW (ref 13.0–17.0)
MCH: 36.5 pg — ABNORMAL HIGH (ref 26.0–34.0)
MCHC: 34.9 g/dL (ref 30.0–36.0)
MCV: 104.5 fL — ABNORMAL HIGH (ref 78.0–100.0)
PLATELETS: 186 10*3/uL (ref 150–400)
RBC: 3.37 MIL/uL — ABNORMAL LOW (ref 4.22–5.81)
RDW: 12.5 % (ref 11.5–15.5)
WBC: 6.8 10*3/uL (ref 4.0–10.5)

## 2015-03-29 LAB — I-STAT ARTERIAL BLOOD GAS, ED
ACID-BASE DEFICIT: 5 mmol/L — AB (ref 0.0–2.0)
Bicarbonate: 19.5 mEq/L — ABNORMAL LOW (ref 20.0–24.0)
O2 SAT: 97 %
PH ART: 7.397 (ref 7.350–7.450)
Patient temperature: 98.6
TCO2: 20 mmol/L (ref 0–100)
pCO2 arterial: 31.7 mmHg — ABNORMAL LOW (ref 35.0–45.0)
pO2, Arterial: 89 mmHg (ref 80.0–100.0)

## 2015-03-29 LAB — I-STAT CG4 LACTIC ACID, ED: LACTIC ACID, VENOUS: 1.45 mmol/L (ref 0.5–2.0)

## 2015-03-29 LAB — BETA-HYDROXYBUTYRIC ACID: BETA-HYDROXYBUTYRIC ACID: 3.89 mmol/L — AB (ref 0.05–0.27)

## 2015-03-29 LAB — GLUCOSE, CAPILLARY
GLUCOSE-CAPILLARY: 164 mg/dL — AB (ref 65–99)
Glucose-Capillary: 201 mg/dL — ABNORMAL HIGH (ref 65–99)

## 2015-03-29 LAB — CBG MONITORING, ED
Glucose-Capillary: 294 mg/dL — ABNORMAL HIGH (ref 65–99)
Glucose-Capillary: 219 mg/dL — ABNORMAL HIGH (ref 65–99)

## 2015-03-29 LAB — MAGNESIUM: Magnesium: 2 mg/dL (ref 1.7–2.4)

## 2015-03-29 MED ORDER — GABAPENTIN 300 MG PO CAPS
300.0000 mg | ORAL_CAPSULE | Freq: Every day | ORAL | Status: DC
  Administered 2015-03-29 – 2015-03-31 (×3): 300 mg via ORAL
  Filled 2015-03-29 (×3): qty 1

## 2015-03-29 MED ORDER — AMLODIPINE BESYLATE 10 MG PO TABS
10.0000 mg | ORAL_TABLET | Freq: Every day | ORAL | Status: DC
  Administered 2015-03-29 – 2015-04-01 (×4): 10 mg via ORAL
  Filled 2015-03-29 (×4): qty 1

## 2015-03-29 MED ORDER — SODIUM CHLORIDE 0.9 % IV BOLUS (SEPSIS)
1000.0000 mL | Freq: Once | INTRAVENOUS | Status: AC
  Administered 2015-03-29: 1000 mL via INTRAVENOUS

## 2015-03-29 MED ORDER — LORAZEPAM 2 MG/ML IJ SOLN: 0.0000 mg | Freq: Two times a day (BID) | INTRAMUSCULAR | Status: DC

## 2015-03-29 MED ORDER — LORAZEPAM 2 MG/ML IJ SOLN: 2.0000 mg | INTRAMUSCULAR | Status: DC | PRN

## 2015-03-29 MED ORDER — LORAZEPAM 1 MG PO TABS: 0.0000 mg | ORAL_TABLET | Freq: Two times a day (BID) | ORAL | Status: DC

## 2015-03-29 MED ORDER — ADULT MULTIVITAMIN W/MINERALS CH
1.0000 | ORAL_TABLET | Freq: Every day | ORAL | Status: DC
  Administered 2015-03-29 – 2015-04-01 (×4): 1 via ORAL
  Filled 2015-03-29 (×4): qty 1

## 2015-03-29 MED ORDER — ENOXAPARIN SODIUM 30 MG/0.3ML ~~LOC~~ SOLN
30.0000 mg | SUBCUTANEOUS | Status: DC
  Administered 2015-03-29: 30 mg via SUBCUTANEOUS
  Filled 2015-03-29: qty 0.3

## 2015-03-29 MED ORDER — INSULIN REGULAR HUMAN 100 UNIT/ML IJ SOLN
INTRAMUSCULAR | Status: DC
  Filled 2015-03-29: qty 2.5

## 2015-03-29 MED ORDER — INSULIN REGULAR HUMAN 100 UNIT/ML IJ SOLN
INTRAMUSCULAR | Status: DC
  Administered 2015-03-29: 1.6 [IU]/h via INTRAVENOUS
  Filled 2015-03-29: qty 2.5

## 2015-03-29 MED ORDER — THIAMINE HCL 100 MG/ML IJ SOLN
100.0000 mg | Freq: Every day | INTRAMUSCULAR | Status: DC
Start: 2015-03-29 — End: 2015-03-29

## 2015-03-29 MED ORDER — VITAMIN B-1 100 MG PO TABS: 100.0000 mg | ORAL_TABLET | Freq: Every day | ORAL | Status: DC

## 2015-03-29 MED ORDER — FOLIC ACID 1 MG PO TABS
1.0000 mg | ORAL_TABLET | Freq: Every day | ORAL | Status: DC
  Administered 2015-03-29 – 2015-04-01 (×4): 1 mg via ORAL
  Filled 2015-03-29 (×3): qty 1

## 2015-03-29 MED ORDER — DEXTROSE-NACL 5-0.45 % IV SOLN: INTRAVENOUS | Status: DC

## 2015-03-29 MED ORDER — VITAMIN B-1 100 MG PO TABS
100.0000 mg | ORAL_TABLET | Freq: Every day | ORAL | Status: DC
  Administered 2015-03-30 – 2015-04-01 (×3): 100 mg via ORAL
  Filled 2015-03-29 (×3): qty 1

## 2015-03-29 MED ORDER — NICOTINE 14 MG/24HR TD PT24
14.0000 mg | MEDICATED_PATCH | Freq: Every day | TRANSDERMAL | Status: DC
  Administered 2015-03-29 – 2015-04-01 (×4): 14 mg via TRANSDERMAL
  Filled 2015-03-29 (×4): qty 1

## 2015-03-29 MED ORDER — DEXTROSE-NACL 5-0.45 % IV SOLN
INTRAVENOUS | Status: DC
  Administered 2015-03-29 – 2015-03-30 (×2): via INTRAVENOUS

## 2015-03-29 MED ORDER — LORAZEPAM 1 MG PO TABS: 0.0000 mg | ORAL_TABLET | Freq: Four times a day (QID) | ORAL | Status: DC

## 2015-03-29 MED ORDER — LORAZEPAM 2 MG/ML IJ SOLN: 0.0000 mg | Freq: Four times a day (QID) | INTRAMUSCULAR | Status: DC

## 2015-03-29 MED ORDER — SODIUM CHLORIDE 0.9 % IV SOLN: INTRAVENOUS | Status: DC

## 2015-03-29 MED ORDER — POTASSIUM CHLORIDE 10 MEQ/100ML IV SOLN
10.0000 meq | INTRAVENOUS | Status: AC
  Administered 2015-03-29 (×2): 10 meq via INTRAVENOUS
  Filled 2015-03-29 (×2): qty 100

## 2015-03-29 NOTE — H&P (Signed)
[facility] Admission History and Physical Service Pager: [phone]  Patient name: Robert Sparks Medical record number: [id number] Date of birth: 1945/12/16 Age: 69 y.o. Gender: male  Primary Care Provider: No primary care provider on file. Consultants: none Code Status: FULL (confirmed on admission)  Chief Complaint: elevated blood sugars  Assessment and Plan: Robert Sparks is a 69 y.o. male presenting with elevated blood sugars (CBG 300-400), found to be in Mild DKA in ED additionally chronic alcohol abuse with last drink 3 days ago and considered high risk alcohol withdrawal. PMH is significant for DM2, ETOH abuse, tobacco abuse, GERD and HTN.  Mild DKA, in DM2 likely due to non-adherence to medications: Patient is asymptomatic. Patient presented to ED due to elevated blood sugars with recent purchase of glucometer. CBG 297 in ED. AG 17 initially which improved to 14 with IVF, Bicarb 20. ABG showed pH 7.39. Lactic acid nml. No signs or symptoms of infection. UA showed 15 ketones, >300 protein, hyaline casts, glucose >10000. K initially elevated at 5.3 with acidosis with repeat at 4.8. Betahydroxybutyric acid elevated at 3.89. S/p1L NS bolus in ED. Additionally, patient may be in ketotic state due to chronic alcohol abuse. - will admit to step down, due to initial requiring insulin drip and concern high risk EtOH w/d - start insulin drip protocol with D5NS as pt most recent CBG 219  - BMP q 4 hrs - Check CXR on admit - plan to continue insulin drip protocol until AG closed x 2, then start insulin SQ with likely Lantus 10u and continue insulin drip for 2 hours transition. May resume carb modified diet as tolerated and transfer out of SDU at that point.  DM2, complicated with peripheral neuropathy and calluses: Home med Glimepiride 2mg  daily.  Patient has not taken in >1 yr. Last A1c 5/9 (07/2013) from ED visit. Unclear if established with PCP. - continue home  gabapentin for neuropathic pain - holding Glimepiride  - orderd A1c, Lipids in AM - Prior to discharge will determine if can resume home glimepiride vs insulin therapy. - Recommend starting ASA 81mg  daily in DM2 patient >age 89 - Recommend starting statin high intensity statin therapy in DM2 patient, with significantly elevated ASCVD 10 yr risk score easily >40% (HTN, smoker, DM2), concern with adherence and cost.  Alcohol Abuse: 2x 40z beers daily. Last drink 3-4 days ago. Patient asymptomatic, without clinical evidence of withdrawal on admission. No history of prior withdrawals, seizures, or DT. Mag 2.0 - SDU on admit for close monitoring as precaution for potential withdrawal - CIWA protocol (SDU, then change to floor) - thiamine 100mg , folic acid daily  - EKG ordered for baseline QTc - check Mag and Phos with AM labs, concern for risk of torsades / arrhythmia if develop DTs - Consult CSW for substance resources prior to discharge - PT/OT consult  Tobacco Abuse: - Active smoker. Smokes 0.5 ppd, >50 year history - Nicotine patch 14mg  daily  HTN: Home med amlodipine 10mg  daily. Patient has not taken in >1 yr. BP elevated in ED up to 181/103 but decreased to 158/88. Patient is asymptomatic. - restart Amlodipine 10mg  daily.  - continue to monitor  CKD-III, in setting of uncontrolled HTN, DM2 Baseline SCr 1.6 to 1.7, initially on admit up to 1.9 down to 1.75 with hydration, with GFR ranging from 40-50, consistent with CKD-III. No evidence of acute kidney injury at this time. - Monitor daily BMET - Avoid nephrotoxic medications  FEN/GI: NPO >  transition to carb modified diet once on SQ insulin, D5NS per insulin drip protocol Prophylaxis: Lovenox SQ (lower dose for reduced kidney function)  Disposition: admitted for management of mild DKA  History of Present Illness:  Robert Sparks is a 69 y.o. male presenting with elevated blood sugars  Patient reports that his blood sugar has  been elevated recently for the past 3-4 days when he recently got a glucometer. States CBGs 200-400. Today he presented to ED for further evaluation of elevated blood sugars, and wanted to "get it checked out", but denies any new symptoms or changes that prompted him to come in.  He states last time he took any medicines was >1 year ago, brought in empty pill bottles of prior medicines, states he stopped taking medicines due to being "hard headed", chart review shows last time he received medical care was in ED 07/2013, however >1 year ago he previously went to "primary care on [address]".  Additionally reports that he drinks frequent "large amounts of beer", states he drinks 2x 40z beers daily for "long time", last drink 3-4 days ago. Denies any active tremors or other symptoms. Denies prior history of alcohol withdrawal, hallucinations, seizures.  Lives in [place] with a friend, retired Armed forces logistics/support/administrative officer, now on Fish farm manager. Active smoker since age 89, no other illegal drug or substance use.  - Admits to some pain in both feet and some dryness, with bunion. Occasional generalized itching.  - Denies any fevers, chills, nausea / vomiting, abdominal pain, diarrhea / constipation, chest pain, SOB/DOE, dysuria,  Headaches, blurry vision  Review Of Systems: Per HPI with the following additions: none Otherwise 12 point review of systems was performed and was unremarkable.  Patient Active Problem List   Diagnosis Date Noted  . DKA (diabetic ketoacidosis) 03/29/2015  . DKA, type 2, not at goal 03/29/2015  . Type II or unspecified type diabetes mellitus without mention of complication, not stated as uncontrolled 07/29/2013  . HTN (hypertension) 07/28/2013  . Renal failure (ARF), acute on chronic 07/27/2013  . Anemia, chronic disease 07/26/2013  . Pancytopenia 07/26/2013  . Hypoglycemia 07/25/2013  . Acute encephalopathy 07/25/2013  . Sepsis 07/25/2013  . Hypothermia 07/25/2013  . ETOH abuse  07/25/2013  . Metabolic acidosis 38/25/0539  . ARF (acute renal failure) 07/25/2013   Past Medical History: Past Medical History  Diagnosis Date  . ETOH abuse   . GERD (gastroesophageal reflux disease)   . Type II or unspecified type diabetes mellitus without mention of complication, not stated as uncontrolled 07/29/2013   Past Surgical History: History reviewed. No pertinent past surgical history. Social History: Social History  Substance Use Topics  . Smoking status: Current Every Day Smoker -- 0.50 packs/day    Types: Cigarettes  . Smokeless tobacco: None  . Alcohol Use: Yes     Comment: not in last three days   Additional social history: Lives in [place] with a friend, retired Armed forces logistics/support/administrative officer, now on Fish farm manager. Active smoker since age 73, no other illegal drug or substance use. Please also refer to relevant sections of EMR.  Family History: No family history on file. Allergies and Medications: No Known Allergies No current facility-administered medications on file prior to encounter.   Current Outpatient Prescriptions on File Prior to Encounter  Medication Sig Dispense Refill  . Multiple Vitamin (MULTIVITAMIN WITH MINERALS) TABS tablet Take 1 tablet by mouth daily.    [name] amLODipine (NORVASC) 10 MG tablet Take 1 tablet (10 mg total) by  mouth daily. (Patient not taking: Reported on 03/29/2015) 31 tablet 0  . folic acid (FOLVITE) 1 MG tablet Take 1 tablet (1 mg total) by mouth daily. (Patient not taking: Reported on 03/29/2015)    . glimepiride (AMARYL) 2 MG tablet Take 1 tablet (2 mg total) by mouth daily with breakfast. (Patient not taking: Reported on 03/29/2015) 31 tablet 0  . nicotine (NICODERM CQ - DOSED IN MG/24 HOURS) 21 mg/24hr patch Place 1 patch (21 mg total) onto the skin daily. 28 patch 0  . thiamine 100 MG tablet Take 1 tablet (100 mg total) by mouth daily.      Objective: BP 168/98 mmHg  Pulse 83  Temp(Src) 98.3 F (36.8 C) (Oral)  Resp 20  Ht 5\' 4"   (1.626 m)  Wt 152 lb 5.4 oz (69.1 kg)  BMI 26.14 kg/m2  SpO2 100% Exam: General: NAD, resting in bed  Eyes: PERRL, EOM intact, sclerae clear  ENTM: missing top dentures, mildly dry MM, oropharynx clear Neck: normal ROM Cardiovascular: RRR, no m/r/g Respiratory: CTAB bilaterally, no signs of increased work of breathing Abdomen: soft, ND, NT, + Bowel sounds MSK: able to move all extremities Skin: callouses bilateral feet with mild +TTP. No ulcers noted on feet.  Neuro: AAOx3, CN 2-12 grossly intact, distal muscle str 5/5 intact grip, ankle dorsiflex PSYCH: Mood and affect euthymic, normal rate and volume of speech  Labs and Imaging: CBC BMET   Recent Labs Lab 03/29/15 1103 03/29/15 1737  WBC 6.8  --   HGB 12.3* 12.9*  HCT 35.2* 38.0*  PLT 186  --     Recent Labs Lab 03/29/15 2205  NA 135  K 4.1  CL 100*  CO2 21*  BUN 19  CREATININE 1.75*  GLUCOSE 229*  CALCIUM 9.4     Mag 2.0  Lactic Acid 1.45  Beta-hydroxybutyric Acid - 3.89  EKG NSR, no acute ST-T changes. QTc 420.  CXR Portable 1v IMPRESSION: No evidence of acute cardiopulmonary disease.  Olin Hauser, DO 03/29/2015, 11:44 PM PGY-1, [facility] pager: [phone], text pages welcome   Upper Level Addendum:  I have seen and evaluated this patient along with Dr. Dallas Schimke and reviewed the above note, making necessary revisions in purple.   Nobie Putnam, [place], PGY-3

## 2015-03-29 NOTE — ED Notes (Signed)
Pt placed on cardiac monitor. Denies any pain.

## 2015-03-29 NOTE — ED Notes (Signed)
Pt given urinal for sample. Pt states that he is unable to urinate at this time.

## 2015-03-29 NOTE — ED Provider Notes (Signed)
CSN: [id number]     Arrival date & time 03/29/15  5361 History   First MD Initiated Contact with Patient 03/29/15 1551     Chief Complaint  Patient presents with  . Hyperglycemia     (Consider location/radiation/quality/duration/timing/severity/associated sxs/prior Treatment) HPI Comments: Patient presents to the ER for evaluation of elevated blood sugar. Patient does report a previous history of diabetes, but he has been off his medications for some time. Patient reports that he just got a glucometer several days ago and started checking his blood sugar. He was in the 400s yesterday and 300s today. He denies any illness, has not had any fever, chills, cough, shortness of breath, vomiting, diarrhea or abdominal pain.  Patient also reports that he drinks daily. He has not had any alcohol for the last 3 days. He denies ever having significant withdrawal symptoms such as DTs or seizures.  Patient is a 69 y.o. male presenting with hyperglycemia.  Hyperglycemia Associated symptoms: no shortness of breath     Past Medical History  Diagnosis Date  . ETOH abuse   . GERD (gastroesophageal reflux disease)   . Type II or unspecified type diabetes mellitus without mention of complication, not stated as uncontrolled 07/29/2013   History reviewed. No pertinent past surgical history. No family history on file. Social History  Substance Use Topics  . Smoking status: Current Every Day Smoker -- 0.50 packs/day    Types: Cigarettes  . Smokeless tobacco: None  . Alcohol Use: Yes     Comment: not in last three days    Review of Systems  Respiratory: Negative for shortness of breath.   Cardiovascular: Negative.   All other systems reviewed and are negative.     Allergies  Review of patient's allergies indicates no known allergies.  Home Medications   Prior to Admission medications   Medication Sig Start Date End Date Taking? Authorizing Provider  Multiple Vitamin (MULTIVITAMIN WITH  MINERALS) TABS tablet Take 1 tablet by mouth daily. 07/29/13  Yes Eugenie Filler, MD  amLODipine (NORVASC) 10 MG tablet Take 1 tablet (10 mg total) by mouth daily. Patient not taking: Reported on 03/29/2015 07/29/13   Eugenie Filler, MD  folic acid (FOLVITE) 1 MG tablet Take 1 tablet (1 mg total) by mouth daily. Patient not taking: Reported on 03/29/2015 07/29/13   Eugenie Filler, MD  gabapentin (NEURONTIN) 300 MG capsule Take 300 mg by mouth at bedtime.    Historical Provider, MD  glimepiride (AMARYL) 2 MG tablet Take 1 tablet (2 mg total) by mouth daily with breakfast. Patient not taking: Reported on 03/29/2015 07/29/13   Eugenie Filler, MD  nicotine (NICODERM CQ - DOSED IN MG/24 HOURS) 21 mg/24hr patch Place 1 patch (21 mg total) onto the skin daily. 07/29/13   Eugenie Filler, MD  thiamine 100 MG tablet Take 1 tablet (100 mg total) by mouth daily. 07/29/13   Eugenie Filler, MD   BP 152/106 mmHg  Pulse 83  Temp(Src) 97.7 F (36.5 C) (Oral)  Resp 18  SpO2 98% Physical Exam  Constitutional: He is oriented to person, place, and time. He appears well-developed and well-nourished. No distress.  HENT:  Head: Normocephalic and atraumatic.  Right Ear: Hearing normal.  Left Ear: Hearing normal.  Nose: Nose normal.  Mouth/Throat: Oropharynx is clear and moist and mucous membranes are normal.  Eyes: Conjunctivae and EOM are normal. Pupils are equal, round, and reactive to light.  Neck: Normal range of motion. Neck supple.  Cardiovascular: Regular rhythm, S1 normal and S2 normal.  Exam reveals no gallop and no friction rub.   No murmur heard. Pulmonary/Chest: Effort normal and breath sounds normal. No respiratory distress. He exhibits no tenderness.  Abdominal: Soft. Normal appearance and bowel sounds are normal. There is no hepatosplenomegaly. There is no tenderness. There is no rebound, no guarding, no tenderness at McBurney's point and negative Murphy's sign. No hernia.   Musculoskeletal: Normal range of motion.  Neurological: He is alert and oriented to person, place, and time. He has normal strength. No cranial nerve deficit or sensory deficit. Coordination normal. GCS eye subscore is 4. GCS verbal subscore is 5. GCS motor subscore is 6.  Skin: Skin is warm, dry and intact. No rash noted. No cyanosis.  Psychiatric: He has a normal mood and affect. His speech is normal and behavior is normal. Thought content normal.  Nursing note and vitals reviewed.   ED Course  Procedures (including critical care time) Labs Review Labs Reviewed  BASIC METABOLIC PANEL - Abnormal; Notable for the following:    Potassium 5.3 (*)    Chloride 98 (*)    CO2 20 (*)    Glucose, Bld 297 (*)    Creatinine, Ser 1.92 (*)    Calcium 10.4 (*)    GFR calc non Af Amer 34 (*)    GFR calc Af Amer 40 (*)    Anion gap 17 (*)    All other components within normal limits  CBC - Abnormal; Notable for the following:    RBC 3.37 (*)    Hemoglobin 12.3 (*)    HCT 35.2 (*)    MCV 104.5 (*)    MCH 36.5 (*)    All other components within normal limits  BETA-HYDROXYBUTYRIC ACID - Abnormal; Notable for the following:    Beta-Hydroxybutyric Acid 3.89 (*)    All other components within normal limits  CBG MONITORING, ED - Abnormal; Notable for the following:    Glucose-Capillary 294 (*)    All other components within normal limits  I-STAT ARTERIAL BLOOD GAS, ED - Abnormal; Notable for the following:    pCO2 arterial 31.7 (*)    Bicarbonate 19.5 (*)    Acid-base deficit 5.0 (*)    All other components within normal limits  I-STAT CHEM 8, ED - Abnormal; Notable for the following:    BUN 22 (*)    Creatinine, Ser 1.70 (*)    Glucose, Bld 261 (*)    Hemoglobin 12.9 (*)    HCT 38.0 (*)    All other components within normal limits  URINALYSIS, ROUTINE W REFLEX MICROSCOPIC (NOT AT [facility])  CBG MONITORING, ED  I-STAT CG4 LACTIC ACID, ED    Imaging Review No results found. I have  personally reviewed and evaluated these images and lab results as part of my medical decision-making.   EKG Interpretation None      MDM   Final diagnoses:  Diabetic ketoacidosis without coma associated with other specified diabetes mellitus    Presented to the ER for evaluation of elevated blood sugars. Patient does have a history of diabetes but has been off his medications for a very long time. Patient reports that he just got a glucose meter and has been taking his blood sugars for the last couple of days. He has been running in the 3-400 range. He is asymptomatic. There is no sign of infection. Is not expressing any chest pain. Elevated blood sugars are secondary to noncompliance. He does,  however, have a history of alcohol abuse. He has not had any alcohol in 3 days. This could be driving up his blood sugars as well, but he does not have any significant overt signs of withdrawal at this time.  Lab work reveals anion gap Acidosis with hyperglycemia. Beta hydroxybutyric acid is elevated. This is consistent with diabetic ketoacidosis. Patient to minister to IV fluids initially. Repeat blood sugar is under 300. Patient will likely require IV insulin and dextrose to clear his ketones. Will hospitalize for further management. Patient is also at risk for alcohol withdrawal, will need to be watched closely.    Orpah Greek, MD 03/29/15 [phone]

## 2015-03-29 NOTE — ED Notes (Signed)
CBG - 219 

## 2015-03-29 NOTE — ED Notes (Signed)
Dr. Nelva Nay paged for clarification for Insulin gtt

## 2015-03-29 NOTE — ED Notes (Signed)
Attempted report 

## 2015-03-29 NOTE — ED Notes (Signed)
PT here for elevated blood sugar 305 and poor appetite.  Not currently taking medications for diabetes, pt states he just stopped taking them. Pt reports increased urination.  CBg at triage 294.  Pt states he has not been drinking etoh in the last 3 days.  NO tremors or seizures.  Pt reports sluggish bowels.  No chest pain/sob

## 2015-03-30 DIAGNOSIS — E1310 Other specified diabetes mellitus with ketoacidosis without coma: Principal | ICD-10-CM

## 2015-03-30 LAB — LIPID PANEL
Cholesterol: 294 mg/dL — ABNORMAL HIGH (ref 0–200)
HDL: 42 mg/dL (ref >40)
LDL CALC: 205 mg/dL — AB (ref 0–99)
Total CHOL/HDL Ratio: 7 RATIO
Triglycerides: 236 mg/dL — ABNORMAL HIGH (ref <150)
VLDL: 47 mg/dL — ABNORMAL HIGH (ref 0–40)

## 2015-03-30 LAB — CBC
HEMATOCRIT: 31.9 % — AB (ref 39.0–52.0)
Hemoglobin: 11.4 g/dL — ABNORMAL LOW (ref 13.0–17.0)
MCH: 36.9 pg — ABNORMAL HIGH (ref 26.0–34.0)
MCHC: 35.7 g/dL (ref 30.0–36.0)
MCV: 103.2 fL — AB (ref 78.0–100.0)
Platelets: 176 10*3/uL (ref 150–400)
RBC: 3.09 MIL/uL — AB (ref 4.22–5.81)
RDW: 12.4 % (ref 11.5–15.5)
WBC: 6 10*3/uL (ref 4.0–10.5)

## 2015-03-30 LAB — GLUCOSE, CAPILLARY
GLUCOSE-CAPILLARY: 103 mg/dL — AB (ref 65–99)
GLUCOSE-CAPILLARY: 145 mg/dL — AB (ref 65–99)
GLUCOSE-CAPILLARY: 148 mg/dL — AB (ref 65–99)
GLUCOSE-CAPILLARY: 174 mg/dL — AB (ref 65–99)
GLUCOSE-CAPILLARY: 191 mg/dL — AB (ref 65–99)
GLUCOSE-CAPILLARY: 261 mg/dL — AB (ref 65–99)
GLUCOSE-CAPILLARY: 318 mg/dL — AB (ref 65–99)
Glucose-Capillary: 176 mg/dL — ABNORMAL HIGH (ref 65–99)
Glucose-Capillary: 182 mg/dL — ABNORMAL HIGH (ref 65–99)
Glucose-Capillary: 184 mg/dL — ABNORMAL HIGH (ref 65–99)
Glucose-Capillary: 125 mg/dL — ABNORMAL HIGH (ref 65–99)
Glucose-Capillary: 245 mg/dL — ABNORMAL HIGH (ref 65–99)
Glucose-Capillary: 206 mg/dL — ABNORMAL HIGH (ref 65–99)

## 2015-03-30 LAB — COMPREHENSIVE METABOLIC PANEL
ALBUMIN: 2.8 g/dL — AB (ref 3.5–5.0)
ALK PHOS: 29 U/L — AB (ref 38–126)
ALT: 16 U/L — ABNORMAL LOW (ref 17–63)
AST: 26 U/L (ref 15–41)
Anion gap: 10 (ref 5–15)
BILIRUBIN TOTAL: 1.1 mg/dL (ref 0.3–1.2)
BUN: 16 mg/dL (ref 6–20)
CALCIUM: 8.9 mg/dL (ref 8.9–10.3)
CO2: 23 mmol/L (ref 22–32)
CREATININE: 1.41 mg/dL — AB (ref 0.61–1.24)
Chloride: 99 mmol/L — ABNORMAL LOW (ref 101–111)
GFR calc Af Amer: 58 mL/min — ABNORMAL LOW (ref >60)
GFR calc non Af Amer: 50 mL/min — ABNORMAL LOW (ref >60)
GLUCOSE: 201 mg/dL — AB (ref 65–99)
Potassium: 3.8 mmol/L (ref 3.5–5.1)
Sodium: 132 mmol/L — ABNORMAL LOW (ref 135–145)
TOTAL PROTEIN: 6.4 g/dL — AB (ref 6.5–8.1)

## 2015-03-30 LAB — BASIC METABOLIC PANEL
ANION GAP: 10 (ref 5–15)
ANION GAP: 13 (ref 5–15)
BUN: 18 mg/dL (ref 6–20)
BUN: 16 mg/dL (ref 6–20)
CALCIUM: 9.2 mg/dL (ref 8.9–10.3)
CHLORIDE: 102 mmol/L (ref 101–111)
CHLORIDE: 101 mmol/L (ref 101–111)
CO2: 24 mmol/L (ref 22–32)
CO2: 20 mmol/L — AB (ref 22–32)
CREATININE: 1.57 mg/dL — AB (ref 0.61–1.24)
Calcium: 9.4 mg/dL (ref 8.9–10.3)
Creatinine, Ser: 1.51 mg/dL — ABNORMAL HIGH (ref 0.61–1.24)
GFR calc non Af Amer: 44 mL/min — ABNORMAL LOW (ref >60)
GFR calc non Af Amer: 46 mL/min — ABNORMAL LOW (ref >60)
GFR, EST AFRICAN AMERICAN: 51 mL/min — AB (ref >60)
GFR, EST AFRICAN AMERICAN: 53 mL/min — AB (ref >60)
Glucose, Bld: 144 mg/dL — ABNORMAL HIGH (ref 65–99)
Glucose, Bld: 295 mg/dL — ABNORMAL HIGH (ref 65–99)
POTASSIUM: 4.4 mmol/L (ref 3.5–5.1)
Potassium: 4 mmol/L (ref 3.5–5.1)
Sodium: 136 mmol/L (ref 135–145)
Sodium: 134 mmol/L — ABNORMAL LOW (ref 135–145)

## 2015-03-30 LAB — MAGNESIUM: Magnesium: 1.8 mg/dL (ref 1.7–2.4)

## 2015-03-30 LAB — PHOSPHORUS: Phosphorus: 3.6 mg/dL (ref 2.5–4.6)

## 2015-03-30 LAB — MRSA PCR SCREENING: MRSA by PCR: NEGATIVE

## 2015-03-30 MED ORDER — ATORVASTATIN CALCIUM 40 MG PO TABS
40.0000 mg | ORAL_TABLET | Freq: Every day | ORAL | Status: DC
  Administered 2015-03-30 – 2015-03-31 (×2): 40 mg via ORAL
  Filled 2015-03-30 (×2): qty 1

## 2015-03-30 MED ORDER — ASPIRIN EC 81 MG PO TBEC
81.0000 mg | DELAYED_RELEASE_TABLET | Freq: Every day | ORAL | Status: DC
Start: 2015-03-30 — End: 2015-04-01
  Administered 2015-03-30 – 2015-04-01 (×3): 81 mg via ORAL
  Filled 2015-03-30 (×3): qty 1

## 2015-03-30 MED ORDER — INSULIN GLARGINE 100 UNIT/ML ~~LOC~~ SOLN
10.0000 [IU] | Freq: Every day | SUBCUTANEOUS | Status: DC
  Administered 2015-03-30: 10 [IU] via SUBCUTANEOUS
  Filled 2015-03-30 (×2): qty 0.1

## 2015-03-30 MED ORDER — LISINOPRIL 5 MG PO TABS
5.0000 mg | ORAL_TABLET | Freq: Every day | ORAL | Status: DC
  Administered 2015-03-30 – 2015-04-01 (×3): 5 mg via ORAL
  Filled 2015-03-30 (×3): qty 1

## 2015-03-30 MED ORDER — ACETAMINOPHEN 325 MG PO TABS
650.0000 mg | ORAL_TABLET | Freq: Four times a day (QID) | ORAL | Status: DC | PRN
  Administered 2015-03-30: 650 mg via ORAL
  Filled 2015-03-30: qty 2

## 2015-03-30 MED ORDER — INSULIN STARTER KIT- SYRINGES (ENGLISH)
1.0000 | Freq: Once | Status: AC
  Administered 2015-03-30: 1
  Filled 2015-03-30: qty 1

## 2015-03-30 MED ORDER — ENOXAPARIN SODIUM 40 MG/0.4ML ~~LOC~~ SOLN
40.0000 mg | Freq: Every day | SUBCUTANEOUS | Status: DC
  Administered 2015-03-30 – 2015-03-31 (×2): 40 mg via SUBCUTANEOUS
  Filled 2015-03-30 (×2): qty 0.4

## 2015-03-30 MED ORDER — INSULIN ASPART 100 UNIT/ML ~~LOC~~ SOLN
0.0000 [IU] | Freq: Three times a day (TID) | SUBCUTANEOUS | Status: DC
  Administered 2015-03-30: 3 [IU] via SUBCUTANEOUS
  Administered 2015-03-30: 7 [IU] via SUBCUTANEOUS
  Administered 2015-03-31: 9 [IU] via SUBCUTANEOUS
  Administered 2015-03-31: 3 [IU] via SUBCUTANEOUS
  Administered 2015-04-01: 5 [IU] via SUBCUTANEOUS
  Administered 2015-04-01: 3 [IU] via SUBCUTANEOUS
  Filled 2015-03-30: qty 0.09

## 2015-03-30 MED ORDER — LIVING WELL WITH DIABETES BOOK
Freq: Every morning | Status: DC
  Administered 2015-03-30: 14:00:00
  Filled 2015-03-30 (×2): qty 1

## 2015-03-30 NOTE — Progress Notes (Signed)
Inpatient Diabetes Program Recommendations  AACE/ADA: New Consensus Statement on Inpatient Glycemic Control (2015)  Target Ranges:  Prepandial:   less than 140 mg/dL      Peak postprandial:   less than 180 mg/dL (1-2 hours)      Critically ill patients:  140 - 180 mg/dL    Results for Robert Sparks, Robert Sparks (MRN [id number]) as of 03/30/2015 08:49  Ref. Range 03/29/2015 11:03  Glucose Latest Ref Range: 65-99 mg/dL 297 (H)     Admit with: Hyperglycemia/ At risk for ETOH withdrawal  History: DM, ETOH Abuse, CKD  Home DM Meds: Amaryl 2 mg daily (patient not taking for over one year)  Current DM Orders: IV Insulin drip     -Planning to transition off IV insulin drip this AM to Lantus and Novolog.  Patient has orders to receive 10 units Lantus.  -Note MD notes stating they will use A1c to determine course of treatment for home (Amaryl versus Insulin).  -Spoke with pt about his DM care regimen at home.  Explained what an A1C is (A1C pending), basic pathophysiology of DM Type 2, basic home care, basic diabetes diet nutrition principles, importance of checking CBGs and maintaining good CBG control to prevent long-term and short-term complications.   Also reviewed blood sugar goals at home.    -Patient stated he has had DM for over 10 years but has not taken his Amaryl in over a year.  Just bought a CBG meter a few weeks ago and saw his CBGs were 300-400 range.  Has been having thirst and excessive urination.  Is not opposed to taking insulin at home but is concerned about the cost.  Pt unsure if he has Medicare Part D coverage.  -RNs to provide ongoing basic DM education at bedside with this patient.  Have ordered educational booklet, insulin starter kit, and DM videos.  Have also placed RD consult for DM diet education for this patient.     MD- Please keep cost considerations in mind when planning home DM medications.  Will have RNs begin insulin education with patient with vial and syringe  teaching kit.  Patient can purchase 70/30 insulin out of pocket at [facility] for $25 per vial with a doctor's Rx.  This may be the easiest and most affordable regimen for patient.     Will follow Jeannine Johnston Fishel RN, MSN, CDE Diabetes Coordinator Inpatient Glycemic Control Team Team Pager: [phone] (8a-5p)     

## 2015-03-30 NOTE — Care Management Note (Signed)
Case Management Note  Patient Details  Name: Robert Sparks MRN: [id number] Date of Birth: Oct 08, 1945  Subjective/Objective:       Received referral to assist pt who has no PCP.    Provided [facility] telephone number and explained process to pt.  He will call to determine provider who is accepting Medicare pts and make appt with provider who is on bus route.                Expected Discharge Plan:  Home/Self Care  In-House Referral:  Clinical Social Work  Discharge planning Services  CM Consult  Status of Service:  In process, will continue to follow  Girard Cooter, RN 03/30/2015, 10:54 AM

## 2015-03-30 NOTE — Evaluation (Signed)
  Occupational Therapy Evaluation and Discharge Patient Details Name: Navon Kotowski Barnard MRN: [id number] DOB: [birth year] Today's Date: 03/30/2015    History of Present Illness 69 y.o. male presenting with elevated blood sugars (CBG 300-400), found to be in Mild DKA in ED additionally chronic alcohol abuse and considered high risk alcohol withdrawal. PMH is significant for DM2, ETOH abuse, tobacco abuse, GERD and HTN   Clinical Impression   This 69 yo male admitted with above presents to acute OT at an independent level for BADLs. No further OT needs, we will sign off.    Follow Up Recommendations  No OT follow up    Equipment Recommendations  None recommended by OT       Precautions / Restrictions Precautions Precautions: Fall Restrictions Weight Bearing Restrictions: No      Mobility Bed Mobility Overal bed mobility: Modified Independent             General bed mobility comments: Pt up in recliner upon my arrival  Transfers Overall transfer level: Needs assistance   Transfers: Sit to/from Stand Sit to Stand: Independent                 ADL Overall ADL's : Independent                                             Vision Additional Comments: No change from baseline          Pertinent Vitals/Pain Pain Assessment: No/denies pain     Hand Dominance Right   Extremity/Trunk Assessment Upper Extremity Assessment Upper Extremity Assessment: Overall WFL for tasks assessed     Communication Communication Communication: No difficulties   Cognition Arousal/Alertness: Awake/alert Behavior During Therapy: WFL for tasks assessed/performed Overall Cognitive Status: Within Functional Limits for tasks assessed              Home Living Family/patient expects to be discharged to:: Private residence Living Arrangements: Non-relatives/Friends Available Help at Discharge: Friend(s);Available 24 hours/day Type of Home: House Home Access:  Stairs to enter [employer] of Steps: 4 Entrance Stairs-Rails: Right Home Layout: One level     Bathroom Shower/Tub: Walk-in Hydrologist: Standard     Home Equipment: Shower seat;Grab bars - tub/shower;Hand held shower head          Prior Functioning/Environment Level of Independence: Independent        Comments: pt states he drives limited distance, helps with housework and roommate does the cooking    OT Diagnosis: Generalized weakness         OT Goals(Current goals can be found in the care plan section) Acute Rehab OT Goals Patient Stated Goal: go home  OT Frequency:                End of Session Equipment Utilized During Treatment:  (none) Nurse Communication: Mobility status  Activity Tolerance: Patient tolerated treatment well Patient left: in chair;with call bell/phone within reach;with chair alarm set   Time: [phone] OT Time Calculation (min): 13 min Charges:  OT General Charges $OT Visit: 1 Procedure OT Evaluation $Initial OT Evaluation Tier I: 1 Procedure  Almon Register [id number] 03/30/2015, 12:04 PM

## 2015-03-30 NOTE — Progress Notes (Signed)
Utilization Review Completed.  

## 2015-03-30 NOTE — Evaluation (Signed)
Physical Therapy Evaluation Patient Details Name: Robert Sparks MRN: [id number] DOB: 1945/11/18 Today's Date: 03/30/2015   History of Present Illness  69 y.o. male presenting with elevated blood sugars (CBG 300-400), found to be in Mild DKA in ED additionally chronic alcohol abuse and considered high risk alcohol withdrawal. PMH is significant for DM2, ETOH abuse, tobacco abuse, GERD and HTN  Clinical Impression  Pt pleasant and moving well for basic transfers but noted balance deficits with improved function with use of RW. Pt would benefit from RW for all gait as well as continued therapy for balance and strength training to address below deficits and decrease fall risk.     Follow Up Recommendations Outpatient PT    Equipment Recommendations  Rolling walker with 5" wheels    Recommendations for Other Services       Precautions / Restrictions Precautions Precautions: Fall      Mobility  Bed Mobility Overal bed mobility: Modified Independent                Transfers Overall transfer level: Needs assistance   Transfers: Sit to/from Stand Sit to Stand: Supervision         General transfer comment: cues for hand placement and safety  Ambulation/Gait Ambulation/Gait assistance: Min guard;Min assist Ambulation Distance (Feet): 400 Feet Assistive device: None;Rolling walker (2 wheeled) Gait Pattern/deviations: Step-through pattern;Decreased stride length;Drifts right/left   Gait velocity interpretation: at or above normal speed for age/gender General Gait Details: Pt walked 400' without AD with minguard assist for balance with 2 partial LOB with min assist to correct. Pt utlilized RW for additional 50' of gait with only guarding with cues for stepping into RW and posture but greatly improved balance and stability. Recommend continued use of RW with gait  Stairs Stairs: Yes Stairs assistance: Supervision Stair Management: One rail Right;Alternating  pattern;Forwards Number of Stairs: 4 General stair comments: pt with safe stair management with use of rail with supervision for lines  Wheelchair Mobility    Modified Rankin (Stroke Patients Only)       Balance Overall balance assessment: Needs assistance   Sitting balance-Leahy Scale: Good Sitting balance - Comments: pt able to don and doff socks in sitting without difficulty     Standing balance-Leahy Scale: Fair   Single Leg Stance - Right Leg: 0 Single Leg Stance - Left Leg: 0 Tandem Stance - Right Leg: 1 Tandem Stance - Left Leg: 0 Rhomberg - Eyes Opened: 15 Rhomberg - Eyes Closed: 8                 Pertinent Vitals/Pain Pain Assessment: No/denies pain    Home Living Family/patient expects to be discharged to:: Private residence Living Arrangements: Non-relatives/Friends Available Help at Discharge: Friend(s);Available 24 hours/day Type of Home: House Home Access: Stairs to enter Entrance Stairs-Rails: Right Entrance Stairs-Number of Steps: 4 Home Layout: One level Home Equipment: Shower seat      Prior Function Level of Independence: Independent         Comments: pt states he drives limited distance, helps with housework and roommate does the cooking     Hand Dominance        Extremity/Trunk Assessment   Upper Extremity Assessment: Defer to OT evaluation           Lower Extremity Assessment: Generalized weakness         Communication   Communication: No difficulties  Cognition Arousal/Alertness: Awake/alert Behavior During Therapy: WFL for tasks assessed/performed Overall Cognitive Status: Impaired/Different  from baseline Area of Impairment: Safety/judgement         Safety/Judgement: Decreased awareness of safety          General Comments      Exercises        Assessment/Plan    PT Assessment Patient needs continued PT services  PT Diagnosis Difficulty walking   PT Problem List Decreased strength;Decreased  balance;Decreased safety awareness;Decreased knowledge of use of DME  PT Treatment Interventions Gait training;DME instruction;Functional mobility training;Therapeutic activities;Balance training;Therapeutic exercise;Patient/family education   PT Goals (Current goals can be found in the Care Plan section) Acute Rehab PT Goals Patient Stated Goal: go home PT Goal Formulation: With patient Time For Goal Achievement: 04/13/15 Potential to Achieve Goals: Good    Frequency Min 3X/week   Barriers to discharge        Co-evaluation               End of Session   Activity Tolerance: Patient tolerated treatment well Patient left: in chair;with call bell/phone within reach Nurse Communication: Mobility status         Time: 3825-0539 PT Time Calculation (min) (ACUTE ONLY): 18 min   Charges:   PT Evaluation $Initial PT Evaluation Tier I: 1 Procedure     PT G CodesMelford Aase 03/30/2015, 8:41 AM  Elwyn Reach, [place]

## 2015-03-30 NOTE — Care Management Note (Signed)
Case Management Note  Patient Details  Name: Robert Sparks MRN: [id number] Date of Birth: Aug 12, 1945  Subjective/Objective:   Pt now requesting assistance with [employer].  CM called and obtained contact information for physicians accepting medicare patients.  Pt chose [facility] - CM provided address and telephone number, also called to be sure they were accepting new clients.  Pt states that the friend he lives with will be able to drive him to his appointment and wants to wait until he talks to her before he schedules same.                 Mayo, Opal T, RN 03/30/2015, 12:54 PM

## 2015-03-30 NOTE — Progress Notes (Signed)
Family Medicine Teaching Service Daily Progress Note Intern Pager: [phone]  Patient name: Robert Sparks Medical record number: [id number] Date of birth: 1945-11-14 Age: 69 y.o. Gender: male  Primary Care Provider: No primary care provider on file. Consultants: none Code Status: FULL  Pt Overview and Major Events to Date:  9/19: admitted for mild DKA likely due to non-adherence to medications. Patient is asymptomatic. Started on insulin drip 9/20: transitioned to SQ insulin  Assessment and Plan: Robert Sparks is a 69 y.o. male presenting with elevated blood sugars (CBG 300-400), found to be in Mild DKA in ED additionally chronic alcohol abuse with last drink 3 days ago and considered high risk alcohol withdrawal. PMH is significant for DM2, ETOH abuse, tobacco abuse, GERD and HTN.  Hyperglycemia in DM2 likely due to non-adherence to medications: Patient is asymptomatic. Patient presented to ED due to elevated blood sugars with recent purchase of glucometer. CBG 297 in ED. AG 17 initially which improved to 14 with IVF, Bicarb 20. ABG showed pH 7.39. Lactic acid nml. No signs or symptoms of infection. UA showed 15 ketones, >300 protein, hyaline casts, glucose >10000. CXR: unremarkable. K initially elevated at 5.3 with acidosis with repeat at 4.8. Betahydroxybutyric acid elevated at 3.89. S/p1L NS bolus in ED. Additionally, patient may be in ketotic state due to chronic alcohol abuse. CBGs in 170s-201 on drip.  - admitted to step down, due to initialy requiring insulin drip and concern high risk EtOH w/d -  AG closed overnight on insulin drip with normal ranges x 2 --> will start SQ Lantus 10units and continue insulin drip for 2 hours transition. May resume carb modified diet as tolerated and transfer out of SDU at that point. - BMET 81KG   DM2, complicated with peripheral neuropathy and calluses: Home med Glimepiride 2mg  daily. Patient has not taken in >1 yr. Last A1c 5/9 (07/2013) from ED  visit. Unclear if established with PCP. Lipid panel: cholesterol 294, TAG 235, HDL 42, LDL 205, VLDL 47.  ASCVD 62yr risk 44.1%, - continue home gabapentin for neuropathic pain - holding Glimepiride  - orderd A1c, Lipids in AM - Prior to discharge will determine if can resume home glimepiride vs insulin therapy. - Started ASA 81mg  daily in DM2 patient >age 25 - Started Atorvastatin 40mg  daily; will discuss with patient if he will be able to afford on outpatient basis - Case Management consulted to provide resources to establish PCP  Alcohol Abuse: 2x 40z beers daily. Last drink 3-4 days ago. Patient asymptomatic, without clinical evidence of withdrawal on admission. No history of prior withdrawals, seizures, or DT. Mag 1.8 (2). EKG unremarkable. CIWA scores zero overnight. - SDU on admit for close monitoring as precaution for potential withdrawal - CIWA protocol (SDU, then change to floor) - thiamine 100mg , folic acid daily  - check Mag and Phos with AM labs, concern for risk of torsades / arrhythmia if develop DTs - Consulted CSW for substance resources prior to discharge - PT/OT consult  Tobacco Abuse: - Active smoker. Smokes 0.5 ppd, >50 year history - Nicotine patch 14mg  daily  HTN: Home med amlodipine 10mg  daily. Patient has not taken in >1 yr. BP elevated in ED up to 181/103 but decreased to 158/88. Patient is asymptomatic. BP in AM 113-115/68. - continue Amlodipine 10mg  daily.  - will start Lisinopril today - continue to monitor  CKD-III, in setting of uncontrolled HTN, DM2 Baseline SCr 1.6 to 1.7, initially on admit up to 1.9 down to  1.75 with hydration, with GFR ranging from 40-50, consistent with CKD-III. No evidence of acute kidney injury at this time Cr 1.41. - Monitor daily BMET - Avoid nephrotoxic medications  FEN/GI: NPO > transition to carb modified diet once on SQ insulin, D5NS per insulin drip protocol Prophylaxis: Lovenox SQ (lower dose for reduced kidney  function)  Disposition: admitted for management of hyperglycemia  Subjective:  No concerns this morning. Denies N/V, abdominal pain.   Objective: Temp:  [97.7 F (36.5 C)-98.5 F (36.9 C)] 98.2 F (36.8 C) (09/20 0447) Pulse Rate:  [81-98] 81 (09/20 0300) Resp:  [18-25] 18 (09/20 0300) BP: (113-181)/(68-110) 113/68 mmHg (09/20 0447) SpO2:  [96 %-100 %] 97 % (09/20 0447) Weight:  [152 lb 5.4 oz (69.1 kg)] 152 lb 5.4 oz (69.1 kg) (09/19 2231) Physical Exam: General: NAD, resting in bed  ENTM: missing top dentures, mildly dry MM, oropharynx clear Cardiovascular: RRR, no m/r/g Respiratory: CTAB bilaterally, no signs of increased work of breathing Abdomen: soft, ND, NT, + Bowel sounds Skin: callouses bilateral feet with mild +TTP. No ulcers noted on feet.  PSYCH: Mood and affect euthymic, normal rate and volume of speech  Laboratory:  Recent Labs Lab 03/29/15 1103 03/29/15 1737 03/30/15 0650  WBC 6.8  --  6.0  HGB 12.3* 12.9* 11.4*  HCT 35.2* 38.0* 31.9*  PLT 186  --  176    Recent Labs Lab 03/29/15 2205 03/30/15 0105 03/30/15 0650  NA 135 136 132*  K 4.1 4.0 3.8  CL 100* 102 99*  CO2 21* 24 23  BUN 19 18 16   CREATININE 1.75* 1.57* 1.41*  CALCIUM 9.4 9.4 8.9  PROT  --   --  6.4*  BILITOT  --   --  1.1  ALKPHOS  --   --  29*  ALT  --   --  16*  AST  --   --  26  GLUCOSE 229* 144* 201*   Results for SHADRICK, SENNE (MRN [id number]) as of 03/30/2015 09:23  Ref. Range 03/30/2015 06:50  Cholesterol Latest Ref Range: 0-200 mg/dL 294 (H)  Triglycerides Latest Ref Range: <150 mg/dL 236 (H)  HDL Cholesterol Latest Ref Range: >40 mg/dL 42  LDL (calc) Latest Ref Range: 0-99 mg/dL 205 (H)  VLDL Latest Ref Range: 0-40 mg/dL 47 (H)  Total CHOL/HDL Ratio Latest Units: RATIO 7.0   Results for CHEROKEE, CLOWERS (MRN [id number]) as of 03/30/2015 09:23  Ref. Range 03/30/2015 06:50  Alkaline Phosphatase Latest Ref Range: 38-126 U/L 29 (L)  Albumin Latest Ref Range: 3.5-5.0  g/dL 2.8 (L)  AST Latest Ref Range: 15-41 U/L 26  ALT Latest Ref Range: 17-63 U/L 16 (L)   Imaging/Diagnostic Tests: CXR: nml  Smiley Houseman, MD 03/30/2015, 9:28 AM PGY-1, [place] Intern pager: [phone], text pages welcome

## 2015-03-30 NOTE — Progress Notes (Signed)
CSW met with pt to discuss current substance abuse- pt reports that he drinks 2 40oz beers a day.  Pt does not want resources on rehab and feels as if he can stop on his own with support from his friends.  SBIRT completed.  CSW signing off.  Domenica Reamer, [place] Social Worker [phone]

## 2015-03-31 DIAGNOSIS — E1110 Type 2 diabetes mellitus with ketoacidosis without coma: Secondary | ICD-10-CM | POA: Insufficient documentation

## 2015-03-31 LAB — BASIC METABOLIC PANEL
Anion gap: 10 (ref 5–15)
BUN: 16 mg/dL (ref 6–20)
CO2: 23 mmol/L (ref 22–32)
CREATININE: 1.56 mg/dL — AB (ref 0.61–1.24)
Calcium: 9.3 mg/dL (ref 8.9–10.3)
Chloride: 100 mmol/L — ABNORMAL LOW (ref 101–111)
GFR calc Af Amer: 51 mL/min — ABNORMAL LOW (ref >60)
GFR, EST NON AFRICAN AMERICAN: 44 mL/min — AB (ref >60)
GLUCOSE: 240 mg/dL — AB (ref 65–99)
POTASSIUM: 4.2 mmol/L (ref 3.5–5.1)
Sodium: 133 mmol/L — ABNORMAL LOW (ref 135–145)

## 2015-03-31 LAB — HEMOGLOBIN A1C
Hgb A1c MFr Bld: 10.5 % — ABNORMAL HIGH (ref 4.8–5.6)
Mean Plasma Glucose: 255 mg/dL

## 2015-03-31 LAB — GLUCOSE, CAPILLARY
Glucose-Capillary: 367 mg/dL — ABNORMAL HIGH (ref 65–99)
Glucose-Capillary: 225 mg/dL — ABNORMAL HIGH (ref 65–99)
Glucose-Capillary: 95 mg/dL (ref 65–99)

## 2015-03-31 MED ORDER — INSULIN NPH (HUMAN) (ISOPHANE) 100 UNIT/ML ~~LOC~~ SUSP
5.0000 [IU] | Freq: Two times a day (BID) | SUBCUTANEOUS | Status: DC
  Filled 2015-03-31: qty 10

## 2015-03-31 MED ORDER — INSULIN NPH (HUMAN) (ISOPHANE) 100 UNIT/ML ~~LOC~~ SUSP
7.0000 [IU] | Freq: Two times a day (BID) | SUBCUTANEOUS | Status: DC
  Administered 2015-03-31 – 2015-04-01 (×3): 7 [IU] via SUBCUTANEOUS
  Filled 2015-03-31 (×3): qty 10

## 2015-03-31 NOTE — Progress Notes (Addendum)
Report given to [place], RN on 5W. Patient will be transferred to 5W22 via wheelchair. Milford Cage, RN

## 2015-03-31 NOTE — Progress Notes (Signed)
Family Medicine Teaching Service Daily Progress Note Intern Pager: [phone]  Patient name: Robert Sparks Medical record number: [id number] Date of birth: May 01, 1946 Age: 69 y.o. Gender: male  Primary Care Provider: No primary care provider on file. Consultants: none Code Status: FULL  Pt Overview and Major Events to Date:  9/19: admitted for mild DKA likely due to non-adherence to medications. Patient is asymptomatic. Started on insulin drip 9/20: transitioned to SQ insulin 9/21: transitioned to NPH   Assessment and Plan: Robert Sparks is a 69 y.o. male presenting with elevated blood sugars (CBG 300-400), found to be in Mild DKA in ED additionally chronic alcohol abuse with last drink 3 days ago and considered high risk alcohol withdrawal. PMH is significant for DM2, ETOH abuse, tobacco abuse, GERD and HTN.  Hyperglycemia in DM2 likely due to non-adherence to medications: Patient is asymptomatic. Patient presented to ED due to elevated blood sugars with recent purchase of glucometer. CBG 297 in ED. AG 17 initially which improved to 14 with IVF, Bicarb 20. ABG showed pH 7.39. Lactic acid nml. No signs or symptoms of infection. UA showed 15 ketones, >300 protein, hyaline casts, glucose >10000. CXR: unremarkable. K initially elevated at 5.3 with acidosis with repeat at 4.8. Betahydroxybutyric acid elevated at 3.89. S/p1L NS bolus in ED. Additionally, patient may be in ketotic state due to chronic alcohol abuse. CBGs 206-295; Required 19u novolog since transition. A1c 10.5 (5.5 in 07/2013) -Lantus 10 units-> switched to NPH 5 units BID with SSI as patient will more likely be able to afford NPH - continue to follow CBGs  DM2, complicated with peripheral neuropathy and calluses: Home med Glimepiride 2mg  daily. Patient has not taken in >1 yr. Last A1c 5/9 (07/2013) from ED visit. Unclear if established with PCP. Lipid panel: cholesterol 294, TAG 235, HDL 42, LDL 205, VLDL 47. ASCVD 6yr risk  44.1%, - continue home gabapentin for neuropathic pain - holding Glimepiride  - started on NPH - ASA 81mg  daily in DM2 patient >age 55 - Atorvastatin 40mg  daily; patient states he may be able to afford but would like to see if he can have assistance - Case Management consulted to provide resources to establish PCP: patient chose [facility] - Case management: for medication assistance  Alcohol Abuse: 2x 40z beers daily. Last drink 3-4 days ago. Patient asymptomatic, without clinical evidence of withdrawal on admission. No history of prior withdrawals, seizures, or DT. Mag 1.8 (2). EKG unremarkable. CIWA scores zero overnight. - SDU on admit for close monitoring as precaution for potential withdrawal - thiamine 100mg , folic acid daily  - Consulted CSW for substance resources prior to discharge: patient declined resources - PT/OT consult: PT recs outpatient PT with Rolling walker with 5" wheels; no OT recs  Tobacco Abuse: - Active smoker. Smokes 0.5 ppd, >50 year history - Nicotine patch 14mg  daily  HTN: Home med amlodipine 10mg  daily. Patient has not taken in >1 yr. BP elevated in ED up to 181/103 but decreased to 158/88. Patient is asymptomatic. BP in AM 103-152/58-81. - continue Amlodipine 10mg  daily.  - continue Lisinopril 5mg  daily - continue to monitor  CKD-III, in setting of uncontrolled HTN, DM2 Baseline SCr 1.6 to 1.7, initially on admit up to 1.9 down to 1.75 with hydration, with GFR ranging from 40-50, consistent with CKD-III. No evidence of acute kidney injury at this time Cr 1.56. - Monitor daily BMET - Avoid nephrotoxic medications  FEN/GI: NPO > transition to carb modified diet once on  SQ insulin, D5NS per insulin drip protocol Prophylaxis: Lovenox SQ (lower dose for reduced kidney function)  Disposition: home after determining appropriate insulin regimen   Subjective:  Patient doing well today. No concerns. He is now agreeable to learn how to draw and  administer insulin. He understands the importance of taking his insulin. States that he may be able to afford Atorvastatin, but asks if he can get assistance with medication   Objective: Temp:  [98.1 F (36.7 C)-98.5 F (36.9 C)] 98.5 F (36.9 C) (09/21 0400) Pulse Rate:  [84-91] 84 (09/21 0400) Resp:  [17-24] 22 (09/21 0400) BP: (103-152)/(58-93) 124/58 mmHg (09/21 0400) SpO2:  [100 %] 100 % (09/20 1955) Physical Exam: General: NAD, eating breakfast  ENTM: missing top dentures, oropharynx clear Cardiovascular: RRR, no m/r/g Respiratory: CTAB bilaterally, no signs of increased work of breathing Abdomen: soft, ND, NT, + Bowel sounds Skin: warm and well perfused PSYCH: Mood and affect euthymic, normal rate and volume of speech  Laboratory:  Recent Labs Lab 03/29/15 1103 03/29/15 1737 03/30/15 0650  WBC 6.8  --  6.0  HGB 12.3* 12.9* 11.4*  HCT 35.2* 38.0* 31.9*  PLT 186  --  176    Recent Labs Lab 03/30/15 0650 03/30/15 1217 03/31/15 0303  NA 132* 134* 133*  K 3.8 4.4 4.2  CL 99* 101 100*  CO2 23 20* 23  BUN 16 16 16   CREATININE 1.41* 1.51* 1.56*  CALCIUM 8.9 9.2 9.3  PROT 6.4*  --   --   BILITOT 1.1  --   --   ALKPHOS 29*  --   --   ALT 16*  --   --   AST 26  --   --   GLUCOSE 201* 295* 240*    Smiley Houseman, MD 03/31/2015, 6:36 AM PGY-1, [place] Intern pager: [phone], text pages welcome

## 2015-03-31 NOTE — Progress Notes (Signed)
Pt. Instructed on use of insulin injection and administration. Allowed patient to return demonstration on injections. Pt. Struggled with drawing up insulin from insulin syringes. Patient states he is unable to see the small lines on the insulin needles due to his poor vision and states he does not think he will be able to accurately draw up the correct amount. Pt. Did well with self administering the injections. Pt. Able to verbalize the correct sites and how to inject and properly discard the needles.

## 2015-04-01 LAB — BASIC METABOLIC PANEL
Anion gap: 9 (ref 5–15)
BUN: 14 mg/dL (ref 6–20)
CHLORIDE: 98 mmol/L — AB (ref 101–111)
CO2: 25 mmol/L (ref 22–32)
CREATININE: 1.48 mg/dL — AB (ref 0.61–1.24)
Calcium: 9.5 mg/dL (ref 8.9–10.3)
GFR calc non Af Amer: 47 mL/min — ABNORMAL LOW (ref >60)
GFR, EST AFRICAN AMERICAN: 55 mL/min — AB (ref >60)
GLUCOSE: 239 mg/dL — AB (ref 65–99)
Potassium: 3.8 mmol/L (ref 3.5–5.1)
Sodium: 132 mmol/L — ABNORMAL LOW (ref 135–145)

## 2015-04-01 LAB — GLUCOSE, CAPILLARY
GLUCOSE-CAPILLARY: 274 mg/dL — AB (ref 65–99)
Glucose-Capillary: 287 mg/dL — ABNORMAL HIGH (ref 65–99)
Glucose-Capillary: 202 mg/dL — ABNORMAL HIGH (ref 65–99)

## 2015-04-01 MED ORDER — NICOTINE 14 MG/24HR TD PT24: 14.0000 mg | MEDICATED_PATCH | Freq: Every day | TRANSDERMAL | Status: DC

## 2015-04-01 MED ORDER — AMLODIPINE BESYLATE 10 MG PO TABS: 10.0000 mg | ORAL_TABLET | Freq: Every day | ORAL | Status: DC

## 2015-04-01 MED ORDER — INSULIN ISOPHANE & REGULAR (HUMAN 70-30)100 UNIT/ML KWIKPEN: PEN_INJECTOR | SUBCUTANEOUS | Status: DC

## 2015-04-01 MED ORDER — GABAPENTIN 300 MG PO CAPS: 300.0000 mg | ORAL_CAPSULE | Freq: Every day | ORAL | Status: DC

## 2015-04-01 MED ORDER — METFORMIN HCL 500 MG PO TABS: 500.0000 mg | ORAL_TABLET | Freq: Every day | ORAL | Status: DC

## 2015-04-01 MED ORDER — LISINOPRIL 5 MG PO TABS: 5.0000 mg | ORAL_TABLET | Freq: Every day | ORAL | Status: DC

## 2015-04-01 MED ORDER — "PEN NEEDLES 3/16"" 31G X 5 MM MISC": Status: DC

## 2015-04-01 NOTE — Care Management Note (Signed)
Case Management Note  Patient Details  Name: Robert Sparks MRN: [id number] Date of Birth: [birth year]  Subjective/Objective:   Patient states he will not be able to go to outpt physical therapy , will cancel referral.  Patient chose [facility] for [facility] and HHPT, referral made to [facility], [place] notified.  Soc will begin 24 -48 hrs post discharge. Patient will be going to [facility] clinic for meds at discharge, and his follow up apt is at the [facility] clinic (over flow for [facility] clinic) on 9/30 at 3:30.  Patient states his friend is coming to transport him home .  Jermaine notified to bring rolling walker up to room.                 Action/Plan:   Expected Discharge Date:                  Expected Discharge Plan:  [place]  In-House Referral:  Clinical Social Work  Discharge planning Services  CM Consult, [facility], Follow-up appt scheduled  Post Acute Care Choice:    Choice offered to:     DME Arranged:  Walker rolling DME Agency:  [place]:  RN, PT [facility] Agency:  [place]  Status of Service:  Completed, signed off  Medicare Important Message Given:  Yes-second notification given Date Medicare IM Given:    Medicare IM give by:    Date Additional Medicare IM Given:    Additional Medicare Important Message give by:     If discussed at [place] of Stay Meetings, dates discussed:    Additional Comments:  Zenon Mayo, RN 04/01/2015, 4:08 PM

## 2015-04-01 NOTE — Discharge Instructions (Signed)
You were admitted to [facility] because your blood sugar was elevated. We started you on insulin while in the hospital.  - Please make sure you take your insulin as prescribed; this is very important to get your blood sugar under control. You will also take another diabetes medication called Metformin. You can pick up your medications at the [facility] and [place]. We also started you on two medications for your high blood pressure; please take them as prescribed. - It is important that you check your blood sugar BEFORE breakfast, lunch, and supper EVERYDAY. If you are only able to check your sugar once a day due to financial reasons, then check it [place]. I have prescribed a meter, strips, and lancets, but if you already have a meter at home you do not need to fill this prescription. You should keep a log of your sugars and when you checked them; this is important because it helps your doctor figure out if he or she needs to change your insulin dose. Below is a table that you can use to track your blood sugars. Take your sugar log with you to your doctor's appointment.  - Below is also information on what to do if your blood sugar is too low (hypoglycemia).  - Please make sure to go to your hospital follow up appointment at the [facility] - We have also ordered outpatient physical therapy for you. We have also ordered a walker for you to use at home.  Hypoglycemia Hypoglycemia occurs when the glucose in your blood is too low. Glucose is a type of sugar that is your body's main energy source. Hormones, such as insulin and glucagon, control the level of glucose in the blood. Insulin lowers blood glucose and glucagon increases blood glucose. Having too much insulin in your blood stream, or not eating enough food containing sugar, can result in hypoglycemia. Hypoglycemia can happen to people with or without diabetes. It can develop quickly and can be a  medical emergency.  CAUSES   Missing or delaying meals.  Not eating enough carbohydrates at meals.  Taking too much diabetes medicine.  Not timing your oral diabetes medicine or insulin doses with meals, snacks, and exercise.  Nausea and vomiting.  Certain medicines.  Severe illnesses, such as hepatitis, kidney disorders, and certain eating disorders.  Increased activity or exercise without eating something extra or adjusting medicines.  Drinking too much alcohol.  A nerve disorder that affects body functions like your heart rate, blood pressure, and digestion (autonomic neuropathy).  A condition where the stomach muscles do not function properly (gastroparesis). Therefore, medicines and food may not absorb properly.  Rarely, a tumor of the pancreas can produce too much insulin. SYMPTOMS   Hunger.  Sweating (diaphoresis).  Change in body temperature.  Shakiness.  Headache.  Anxiety.  Lightheadedness.  Irritability.  Difficulty concentrating.  Dry mouth.  Tingling or numbness in the hands or feet.  Restless sleep or sleep disturbances.  Altered speech and coordination.  Change in mental status.  Seizures or prolonged convulsions.  Combativeness.  Drowsiness (lethargic).  Weakness.  Increased heart rate or palpitations.  Confusion.  Pale, gray skin color.  Blurred or double vision.  Fainting.  TREATMENT  Usually, you can easily treat your hypoglycemia when you notice symptoms.  Check your blood glucose. If it is less than 70 mg/dl, take one of the following:    cup juice.    cup regular soda.  1 cup skim milk.   5-6 hard candies.   Avoid high-fat drinks or food that may delay a rise in blood glucose levels.  Do not take more than the recommended amount of sugary foods, drinks, gel, or tablets. Doing so will cause your blood glucose to go too high.   Wait 10-15 minutes and recheck your blood glucose. If it is still  less than 70 mg/dl or below your target range, repeat treatment.   Eat a snack if it is more than 1 hour until your next meal.  There may be a time when your blood glucose may go so low that you are unable to treat yourself at home when you start to notice symptoms. You may need someone to help you. You may even faint or be unable to swallow. If you cannot treat yourself, someone will need to bring you to the hospital.   [place]  If you have diabetes, follow your diabetes management plan by:  Taking your medicines as directed.  Following your meal plan. Do not skip meals. Eat on time.  Testing your blood glucose regularly.   Identify the cause of your hypoglycemia. Then, develop ways to prevent the recurrence of hypoglycemia.  Do not take a hot bath or shower right after an insulin shot.  Maintain a healthy weight. SEEK MEDICAL CARE IF:   You are having problems keeping your blood glucose in your target range.  You are having frequent episodes of hypoglycemia.  You feel you might be having side effects from your medicines.  You are not sure why your blood glucose is dropping so low.  You notice a change in vision or a new problem with your vision. SEEK IMMEDIATE MEDICAL CARE IF:   Confusion develops.  A change in mental status occurs.  The inability to swallow develops.  Fainting occurs. Document Released: 06/26/2005 Document Revised: 07/01/2013 Document Reviewed: 10/23/2011 [facility] Patient Information 2015 [place], [place]. This information is not intended to replace advice given to you by your health care provider. Make sure you discuss any questions you have with your health care provider.   Daily Diabetes Record Check your blood glucose (BG) as directed by your health care provider. Use this form to record your results as well as any diabetes medicines you take, including insulin. Checking your BG, recording it, and bringing your records to your  health care provider is very helpful in managing your diabetes. These numbers help your health care provider know if any changes are needed to your diabetes plan.  Week of _____________________________ Date: _________  Elita Boone, BG/Medicines: ________________ / __________________________________________________________  LUNCH, BG/Medicines: ____________________ / __________________________________________________________  Wonda Cheng, BG/Medicines: ___________________ / __________________________________________________________  BEDTIME, BG/Medicines: __________________ / __________________________________________________________ Date: _________  Elita Boone, BG/Medicines: ________________ / __________________________________________________________  LUNCH, BG/Medicines: ____________________ / __________________________________________________________  Wonda Cheng, BG/Medicines: ___________________ / __________________________________________________________  BEDTIME, BG/Medicines: __________________ / __________________________________________________________ Date: _________  Elita Boone, BG/Medicines: ________________ / __________________________________________________________  LUNCH, BG/Medicines: ____________________ / __________________________________________________________  Wonda Cheng, BG/Medicines: ___________________ / __________________________________________________________  BEDTIME, BG/Medicines: __________________ / __________________________________________________________ Date: _________  Elita Boone, BG/Medicines: ________________ / __________________________________________________________  LUNCH, BG/Medicines: ____________________ / __________________________________________________________  Wonda Cheng, BG/Medicines: ___________________ / __________________________________________________________  BEDTIME, BG/Medicines: __________________ /  __________________________________________________________ Date: _________  Elita Boone, BG/Medicines: ________________ / __________________________________________________________  LUNCH, BG/Medicines: ____________________ / __________________________________________________________  Wonda Cheng, BG/Medicines: ___________________ / __________________________________________________________  BEDTIME, BG/Medicines: __________________ / __________________________________________________________ Date: _________  Elita Boone, BG/Medicines: ________________ / __________________________________________________________  LUNCH, BG/Medicines: ____________________ / __________________________________________________________  Wonda Cheng, BG/Medicines: ___________________ / __________________________________________________________  BEDTIME, BG/Medicines: __________________ / __________________________________________________________ Date: _________  BREAKFAST, BG/Medicines: ________________ / __________________________________________________________  LUNCH, BG/Medicines: ____________________ / __________________________________________________________  Wonda Cheng, BG/Medicines: ___________________ / __________________________________________________________  BEDTIME, BG/Medicines: __________________ / __________________________________________________________ Notes: __________________________________________________________________________________________________ Document Released: 05/30/2004 Document Revised: 11/10/2013 Document Reviewed: 08/20/2013 ExitCare Patient Information 2015 [place], [place]. This information is not intended to replace advice given to you by your health care provider. Make sure you discuss any questions you have with your health care provider.

## 2015-04-01 NOTE — Progress Notes (Signed)
Inpatient Diabetes Program Recommendations  AACE/ADA: New Consensus Statement on Inpatient Glycemic Control (2015)  Target Ranges:  Prepandial:   less than 140 mg/dL      Peak postprandial:   less than 180 mg/dL (1-2 hours)      Critically ill patients:  140 - 180 mg/dL   Review of Glycemic Control  Results for REES, SANTISTEVAN (MRN [id number]) as of 04/01/2015 10:46  Ref. Range 03/31/2015 08:56 03/31/2015 12:56 03/31/2015 18:03 03/31/2015 22:43 04/01/2015 07:56  Glucose-Capillary Latest Ref Range: 65-99 mg/dL 367 (H) 225 (H) 95 287 (H) 274 (H)  Results for ELRIDGE, STEMM (MRN [id number]) as of 04/01/2015 10:46  Ref. Range 03/30/2015 06:50  Hemoglobin A1C Latest Ref Range: 4.8-5.6 % 10.5 (H)   Blood sugars remain > 250 mg/dL. Needs tighter control. Switch to 70/30 insulin since this will be the most cost-effective instead of NPH and Regular.  Inpatient Diabetes Program Recommendations:  Insulin - Basal: Would switch to Novolin 70/30 8 units bid (Can purchase at [facility] for $24.99) Correction (SSI): Add HS correction - Only receiving tidwc. CBGs elevated at night.   RN to continue working with pt on drawing up and giving his insulin injections. If pt cannot see lines on syringe very well, may need pen and he could listen for the clicks. Need to have pt check his blood sugars while inpatient.  Case manager checking on coverage of insulin pen. Will continue to follow. Thank you. Lorenda Peck, RD, LDN, CDE Inpatient Diabetes Coordinator [phone]

## 2015-04-01 NOTE — Progress Notes (Signed)
Family Medicine Teaching Service Daily Progress Note Intern Pager: [phone]  Patient name: Robert Sparks Medical record number: [id number] Date of birth: 02/05/1946 Age: 69 y.o. Gender: male  Primary Care Provider: No primary care provider on file. Consultants: none Code Status: FULL  Pt Overview and Major Events to Date:  9/19: admitted for mild DKA likely due to non-adherence to medications. Patient is asymptomatic. Started on insulin drip 9/20: transitioned to SQ insulin 9/21: transitioned to NPH   Assessment and Plan: Robert Sparks is a 69 y.o. male presenting with elevated blood sugars (CBG 300-400), found to be in Mild DKA in ED additionally chronic alcohol abuse with last drink 3 days ago and considered high risk alcohol withdrawal. PMH is significant for DM2, ETOH abuse, tobacco abuse, GERD and HTN.  Hyperglycemia in DM2 likely due to non-adherence to medications: Patient is asymptomatic. Patient presented to ED due to elevated blood sugars with recent purchase of glucometer. CBG 297 in ED. AG 17 initially which improved to 14 with IVF, Bicarb 20. ABG showed pH 7.39. Lactic acid nml. No signs or symptoms of infection. UA showed 15 ketones, >300 protein, hyaline casts, glucose >10000. CXR: unremarkable. K initially elevated at 5.3 with acidosis with repeat at 4.8. Betahydroxybutyric acid elevated at 3.89. S/p1L NS bolus in ED. Additionally, patient may be in ketotic state due to chronic alcohol abuse. A1c 10.5 (5.5 in 07/2013). CBGs 95-187; Required 8u novolog since starting NPH -NPH 7 units - continue to follow CBGs  DM2, complicated with peripheral neuropathy and calluses: Home med Glimepiride 2mg  daily. Patient has not taken in >1 yr. Last A1c 5/9 (07/2013)-->10.5. Lipid panel: cholesterol 294, TAG 235, HDL 42, LDL 205, VLDL 47. ASCVD 19yr risk 44.1%, - continue home gabapentin for neuropathic pain - holding Glimepiride  - started on NPH 7 units - ASA 81mg  daily in DM2  patient >age 36 - Atorvastatin 40mg  daily; patient states he may be able to afford but would like to see if he can have assistance - Case Management consulted to provide resources to establish PCP: patient chose [facility] - Case management: for medication assistance  Alcohol Abuse: 2x 40z beers daily. Last drink 3-4 days ago. Patient asymptomatic, without clinical evidence of withdrawal on admission. No history of prior withdrawals, seizures, or DT. Mag 1.8 (2). EKG unremarkable. CIWA scores zero overnight. - SDU on admit for close monitoring as precaution for potential withdrawal - thiamine 100mg , folic acid daily  - Consulted CSW for substance resources prior to discharge: patient declined resources - PT/OT consult: PT recs outpatient PT with Rolling walker with 5" wheels; no OT recs  Tobacco Abuse: - Active smoker. Smokes 0.5 ppd, >50 year history - Nicotine patch 14mg  daily  HTN: Home med amlodipine 10mg  daily. Patient has not taken in >1 yr.  BP  110-158/71/89 - Amlodipine 10mg  daily.  - Lisinopril 5mg  daily - continue to monitor  CKD-III, in setting of uncontrolled HTN, DM2 Baseline SCr 1.6 to 1.7, initially on admit up to 1.9 down to 1.75 with hydration, with GFR ranging from 40-50, consistent with CKD-III. No evidence of acute kidney injury at this time Cr 1.56. - Monitor daily BMET - Avoid nephrotoxic medications  FEN/GI: NPO > transition to carb modified diet once on SQ insulin, D5NS per insulin drip protocol Prophylaxis: Lovenox SQ (lower dose for reduced kidney function)  Disposition: home after determining appropriate insulin regimen   Subjective:  - per nursing, patient struggles to draw up insulin from syringes (  stated he is unable to see small lines on the insulin needles due to poor vision and state he does not think he will be able to draw accurately) - Patient doing well, no concerns - given phone numbers for [facility] program and Medicare to determine if  he will be able to apply to Part D.   Objective: Temp:  [97.8 F (36.6 C)-98.6 F (37 C)] 98.6 F (37 C) (09/22 0530) Pulse Rate:  [83-93] 90 (09/22 0530) Resp:  [16-23] 16 (09/22 0530) BP: (110-158)/(71-89) 120/76 mmHg (09/22 0530) SpO2:  [97 %-100 %] 97 % (09/22 0530) Physical Exam: General: NAD, eating breakfast  ENTM: missing top dentures, oropharynx clear Cardiovascular: RRR, no m/r/g Respiratory: CTAB bilaterally, no signs of increased work of breathing Abdomen: soft, ND, NT, + Bowel sounds Skin: warm and well perfused PSYCH: Mood and affect euthymic, normal rate and volume of speech  Laboratory:  Recent Labs Lab 03/29/15 1103 03/29/15 1737 03/30/15 0650  WBC 6.8  --  6.0  HGB 12.3* 12.9* 11.4*  HCT 35.2* 38.0* 31.9*  PLT 186  --  176    Recent Labs Lab 03/30/15 0650 03/30/15 1217 03/31/15 0303 04/01/15 0650  NA 132* 134* 133* 132*  K 3.8 4.4 4.2 3.8  CL 99* 101 100* 98*  CO2 23 20* 23 25  BUN 16 16 16 14   CREATININE 1.41* 1.51* 1.56* 1.48*  CALCIUM 8.9 9.2 9.3 9.5  PROT 6.4*  --   --   --   BILITOT 1.1  --   --   --   ALKPHOS 29*  --   --   --   ALT 16*  --   --   --   AST 26  --   --   --   GLUCOSE 201* 295* 240* 239*    Smiley Houseman, MD 04/01/2015, 8:27 AM PGY-1, [place] Intern pager: [phone], text pages welcome

## 2015-04-01 NOTE — Progress Notes (Signed)
Spoke with case manager concerning patient's insulin and PCP. If patient is to be seen at [facility], he can get a Novolog 70/30 insulin pen from the clinic. Would need physician prescription in order to receive pen at clinic. Dosage comparable to NPH dosage in the hospital would be 70/30 8 units BID. Dosage could be adjusted by physician at [facility]. Harvel Ricks RN BSN CDE

## 2015-04-01 NOTE — Progress Notes (Signed)
Physical Therapy Treatment Patient Details Name: Robert Sparks MRN: [id number] DOB: 1946/03/09 Today's Date: 04/01/2015    History of Present Illness 69 y.o. male presenting with elevated blood sugars (CBG 300-400), found to be in Mild DKA in ED additionally chronic alcohol abuse and considered high risk alcohol withdrawal. PMH is significant for DM2, ETOH abuse, tobacco abuse, GERD and HTN    PT Comments    Pt is getting more confident and safe with RW, planning to transition home today per SW and did demonstrate competence with AD and safety awareness.  Will be able to handle his strengthening needs with OPT, now safe with RW for distances indoors alone.  Follow Up Recommendations  Outpatient PT     Equipment Recommendations  Rolling walker with 5" wheels    Recommendations for Other Services       Precautions / Restrictions Precautions Precautions: Fall Restrictions Weight Bearing Restrictions: No    Mobility  Bed Mobility               General bed mobility comments: up bedside sitting   Transfers Overall transfer level: Modified independent Equipment used: Rolling walker (2 wheeled);1 person hand held assist Transfers: Sit to/from [facility] Sit to Stand: Supervision Stand pivot transfers: Supervision       General transfer comment: cues for hand placement and safety  Ambulation/Gait Ambulation/Gait assistance: Supervision;Min guard Ambulation Distance (Feet): 400 Feet Assistive device: Rolling walker (2 wheeled) Gait Pattern/deviations: Step-through pattern;Trunk flexed;Narrow base of support Gait velocity: reduced Gait velocity interpretation: Below normal speed for age/gender General Gait Details: 400' with no need to direct or control walker   Stairs Stairs: Yes Stairs assistance: Supervision Stair Management: Two rails;Alternating pattern;Forwards Number of Stairs: 4 General stair comments: safe with rails  Wheelchair  Mobility    Modified Rankin (Stroke Patients Only)       Balance     Sitting balance-Leahy Scale: Good       Standing balance-Leahy Scale: Fair                      Cognition Arousal/Alertness: Awake/alert Behavior During Therapy: WFL for tasks assessed/performed Overall Cognitive Status: Within Functional Limits for tasks assessed                      Exercises      General Comments General comments (skin integrity, edema, etc.): Pt is planning to go home with walker and did demonstrate awareness of his balance and use of walker      Pertinent Vitals/Pain Pain Assessment: No/denies pain    Home Living Family/patient expects to be discharged to:: Private residence Living Arrangements: Non-relatives/Friends Available Help at Discharge: Friend(s);Available 24 hours/day Type of Home: House Home Access: Stairs to enter            Prior Function            PT Goals (current goals can now be found in the care plan section) Acute Rehab PT Goals Patient Stated Goal: go home Progress towards PT goals: Progressing toward goals    Frequency  Min 3X/week    PT Plan Current plan remains appropriate    Co-evaluation             End of Session   Activity Tolerance: Patient tolerated treatment well Patient left: in bed;with call bell/phone within reach (sitting)     Time: 1126-1140 PT Time Calculation (min) (ACUTE ONLY): 14 min  Charges:  $  Gait Training: 8-22 mins                    G Codes:      Ramond Dial May 01, 2015, 1:28 PM   Mee Hives, PT MS Acute Rehab Dept. Number: [facility] [phone] and [place] [phone]

## 2015-04-01 NOTE — Discharge Planning (Signed)
IV X2 removed. Pt DC papers given, explained and educated.  Teach-back instructions on admin with insulin pen.  Pt voiced understanding and demonstrated with props. Pt told of suggested FU appts and also of scripts sent to pharm. Pt told of [facility] coming to house to confirm understanding of insulin administration. Sent home with walker. VSS and RN assessment revealed stability for DC to home. Pt walked to ED entrance and family transporting home via car.

## 2015-04-01 NOTE — Discharge Summary (Signed)
[facility] Discharge Summary  Patient name: Robert Sparks Medical record number: [id number] Date of birth: [birth year] Age: 69 y.o. Gender: male Date of Admission: 03/29/2015  Date of Discharge: 04/01/15 Admitting Physician: Blane Ohara McDiarmid, MD  Primary Care Provider: No primary care provider on file. Consultants: none  Indication for Hospitalization: Diabetic Ketosis  Discharge Diagnoses/Problem List:  Diabetic Ketosis  Disposition: home  Discharge Condition: improved    Brief Hospital Course:  Robert Sparks is a 69 y.o. male presenting with elevated blood sugars (CBG 300-400), found to be in Diabetic Ketosis in ED  additionally chronic alcohol abuse with last drink 3 days prior to admission and considered high risk alcohol withdrawal. PMH is significant for DM2, ETOH abuse, tobacco abuse, GERD and HTN. Patient was asymptomatic on arrival; he presented to the ED because he bought a glucometer a few days prior to admission and noticed elevated blood sugars.   Diabetic Ketosis in T2DM: Patient was asymptomatic, afebrile and with stable vitals. Patient had not taken his home Glimerpiride 2mg  daily in over 1 year. His last A1c in 07/2013 was 5.9; repeat A1c during admission was 10.5. Significant labs on admission include: AG 117, Bicarb 20, ABG pH 7.39, Betahydroxybutyric acid 3.89, normal lactic acid. UA showed ketones, >300 protein, hyaline casts, glucose >1000 but no signs of infection. CXR was unremarkable. EKG was unchanged.  He was given 1L fluid bolus in the ED and started on an insulin drip on admission. He was eventually transitioned to subcutaneous insulin once his anion gap closed and was started on NPH as this was likely the least expensive option as patient has financial troubles in affording medications. Diabetes Coordinator was consulted for diabetes education. He was discharged with NPH 70/30 10 units BID and Metformin 500mg  daily. He was  instructed to keep a record of CBGs as well.   DM2 complicated with peripheral neuropathy and calluses:     Patient noted that he had not taken any medications for over 1 year. Lipid panel significant for cholesterol 294, TAG 235, HDL 42, LDL 205, VLDL 47. ASCVD 76yr risk was calculated to be 44.1%. Patient was on Atorvastatin while in the hospital but was not prescribed upon discharge and patient noted he may not be able to afford this medication along with his insulin and antihypertensives. Additionally patient was started on ASA on admission, however was not prescribed on discharge due to financial concerns. Patient was continued on Gabapentin (old home medication). Home health nursing was also ordered for patient to help with administering new medications.   Alcohol Abuse: Patient stated he usually drinks 2x 40oz beers a day, but had not had a drink in about 3 days prior to admission. He denied history of withdrawals or seizures. He was placed on CIWA protocol; he did not show signs of withdrawal during admission. Social work was involved but patient declined any substance abuse resources. PT was consulted and recommended outpatient PT along with rolling walker for home use.    HTN: As noted above patient had not taken any medications in over a year; his old medication was Amlodipine 10mg  daily. BP elevated in ED up to 181/103. He was restarted on Amlodipine and was also started on Lisinopril 5mg  daily due to diagnosis of Diabetes. Patient was prescribed these medications upon discharge. His blood pressures were at goal range after medications were started. His electrolytes remained stable through hospital stay.   CKDIII in setting of uncontrolled DM2 and HTN: Patient's  creatinine was initially 1.9 on admission but improved with hydration. Per chart review his baseline Scr is 1.6-1.7. Nephrotoxic medications were avoided   Issues for Follow Up:  - Patient was on Atorvastatin while in the hospital  but was not prescribed upon discharge and patient noted he may not be able to afford this medication along with his insulin and antihypertensives. Please consider discussing with patient about restarting statin.  - Patient was started on ASA on admission, however was not prescribed on discharge due to financial concerns. Please consider discussing with patient about restarting statin.  - Patient was started on Lisinopril. Consider repeat BMP to check electrolytes and kidney function - consider tobacco cessation and alcohol abuse counseling - follow up on blood glucose levels with new NPH 70/30 insulin regimen   Significant Procedures: none  Significant Labs and Imaging:   Recent Labs Lab 03/29/15 1103 03/29/15 1737 03/30/15 0650  WBC 6.8  --  6.0  HGB 12.3* 12.9* 11.4*  HCT 35.2* 38.0* 31.9*  PLT 186  --  176    Recent Labs Lab 03/29/15 1729  03/30/15 0105 03/30/15 0650 03/30/15 1217 03/31/15 0303 04/01/15 0650  NA  --   < > 136 132* 134* 133* 132*  K  --   < > 4.0 3.8 4.4 4.2 3.8  CL  --   < > 102 99* 101 100* 98*  CO2  --   < > 24 23 20* 23 25  GLUCOSE  --   < > 144* 201* 295* 240* 239*  BUN  --   < > 18 16 16 16 14   CREATININE  --   < > 1.57* 1.41* 1.51* 1.56* 1.48*  CALCIUM  --   < > 9.4 8.9 9.2 9.3 9.5  MG 2.0  --   --  1.8  --   --   --   PHOS  --   --   --  3.6  --   --   --   ALKPHOS  --   --   --  29*  --   --   --   AST  --   --   --  26  --   --   --   ALT  --   --   --  16*  --   --   --   ALBUMIN  --   --   --  2.8*  --   --   --   < > = values in this interval not displayed.    Results/Tests Pending at Time of Discharge: none  Discharge Medications:    Medication List    STOP taking these medications        folic acid 1 MG tablet  Commonly known as:  FOLVITE     glimepiride 2 MG tablet  Commonly known as:  AMARYL     nicotine 21 mg/24hr patch  Commonly known as:  NICODERM CQ - dosed in mg/24 hours  Replaced by:  nicotine 14 mg/24hr patch       TAKE these medications        amLODipine 10 MG tablet  Commonly known as:  NORVASC  Take 1 tablet (10 mg total) by mouth daily.     gabapentin 300 MG capsule  Commonly known as:  NEURONTIN  Take 1 capsule (300 mg total) by mouth at bedtime.     Insulin Isophane & Regular Human (70-30) 100 UNIT/ML PEN  Commonly known as:  HUMULIN 70/30 KWIKPEN  Take 10 units before breakfast and before supper everyday     lisinopril 5 MG tablet  Commonly known as:  PRINIVIL,ZESTRIL  Take 1 tablet (5 mg total) by mouth daily.     metFORMIN 500 MG tablet  Commonly known as:  GLUCOPHAGE  Take 1 tablet (500 mg total) by mouth daily with breakfast.     multivitamin with minerals Tabs tablet  Take 1 tablet by mouth daily.     nicotine 14 mg/24hr patch  Commonly known as:  NICODERM CQ - dosed in mg/24 hours  Place 1 patch (14 mg total) onto the skin daily.     Pen Needles 3/16" 31G X 5 MM Misc  Use one needle twice a day as directed.     thiamine 100 MG tablet  Take 1 tablet (100 mg total) by mouth daily.        Discharge Instructions: Please refer to Patient Instructions section of EMR for full details.  Patient was counseled important signs and symptoms that should prompt return to medical care, changes in medications, dietary instructions, activity restrictions, and follow up appointments.   Follow-Up Appointments: Follow-up Information    Follow up with [facility] On 04/09/2015.   Why:  3:30 for hospital follow up   Contact information:   [place] [address]       Follow up with [facility]    .   Why:  please go here after discharge to pick up your medications they close at 5 pm   Contact information:   [address] [place] [address] [phone]      Follow up with [facility].   Why:  HHRN, HHPT   Contact information:   [place] [address] [phone]       Smiley Houseman, MD 04/01/2015, 6:20 PM PGY-1, [place]

## 2015-04-01 NOTE — Progress Notes (Signed)
Educated patient on insulin pen use at home.  Reviewed contents of insulin flexpen starter kit.  Reviewed all steps of insulin pen including attachment of needle, 2-unit air shot, dialing up dose, giving injection, removing needle, disposal of sharps, storage of unused insulin, disposal of insulin etc.  Patient able to provide successful return demonstration but needed lots of prompting and reinforcement.  Encouraged patient to use educational pamphlet that has insulin pen instructions when he gets home.  Reviewed troubleshooting with insulin pen.  Also reviewed Signs/Symptoms of Hypoglycemia with patient and how to treat Hypoglycemia at home.  Have asked RNs caring for patient to please allow patient to give all injections here in hospital as much as possible for practice.  MD to give patient Rxs for insulin pens and insulin pen needles.    Also instructed patient when to take insulin at home (70/30 insulin).  Instructed patient to take 70/30 insulin bid with breakfast and supper.  Also told patient to take his Metformin bid with breakfast and supper as well.  Patient told to go to the [facility] pharmacy after d/c to pick up his medications.    Will follow .Wyn Quaker RN, MSN, CDE Diabetes Coordinator Inpatient Glycemic Control Team Team Pager: [phone] (8a-5p)

## 2015-04-01 NOTE — Care Management Important Message (Signed)
Important Message  Patient Details  Name: Robert Sparks MRN: [id number] Date of Birth: May 01, 1946   Medicare Important Message Given:  Yes-second notification given    Delorse Lek 04/01/2015, 9:19 AM

## 2015-04-02 ENCOUNTER — Other Ambulatory Visit: Payer: Self-pay | Admitting: Internal Medicine

## 2015-04-02 MED ORDER — INSULIN ASPART PROT & ASPART (70-30 MIX) 100 UNIT/ML PEN: PEN_INJECTOR | SUBCUTANEOUS | Status: DC

## 2015-04-09 ENCOUNTER — Encounter: Payer: Medicare Other | Admitting: Family Medicine

## 2015-04-12 DIAGNOSIS — Z6825 Body mass index (BMI) 25.0-25.9, adult: Secondary | ICD-10-CM | POA: Diagnosis not present

## 2015-04-12 DIAGNOSIS — K5900 Constipation, unspecified: Secondary | ICD-10-CM | POA: Diagnosis not present

## 2015-04-12 DIAGNOSIS — E139 Other specified diabetes mellitus without complications: Secondary | ICD-10-CM | POA: Diagnosis not present

## 2015-04-12 DIAGNOSIS — E299 Testicular dysfunction, unspecified: Secondary | ICD-10-CM | POA: Diagnosis not present

## 2015-04-12 DIAGNOSIS — I10 Essential (primary) hypertension: Secondary | ICD-10-CM | POA: Diagnosis not present

## 2015-04-12 NOTE — Progress Notes (Signed)
This encounter was created in error - please disregard.

## 2015-06-01 DIAGNOSIS — N521 Erectile dysfunction due to diseases classified elsewhere: Secondary | ICD-10-CM | POA: Diagnosis not present

## 2015-06-01 DIAGNOSIS — E291 Testicular hypofunction: Secondary | ICD-10-CM | POA: Diagnosis not present

## 2015-06-01 DIAGNOSIS — E139 Other specified diabetes mellitus without complications: Secondary | ICD-10-CM | POA: Diagnosis not present

## 2015-10-28 ENCOUNTER — Encounter (HOSPITAL_COMMUNITY): Payer: Self-pay | Admitting: Emergency Medicine

## 2015-10-28 ENCOUNTER — Emergency Department (HOSPITAL_COMMUNITY)
Admission: EM | Admit: 2015-10-28 | Discharge: 2015-10-28 | Disposition: A | Discharge status: Home / Self Care | Payer: Medicare Other | Attending: Emergency Medicine | Admitting: Emergency Medicine

## 2015-10-28 DIAGNOSIS — I10 Essential (primary) hypertension: Secondary | ICD-10-CM | POA: Insufficient documentation

## 2015-10-28 DIAGNOSIS — F17210 Nicotine dependence, cigarettes, uncomplicated: Secondary | ICD-10-CM | POA: Diagnosis not present

## 2015-10-28 DIAGNOSIS — E1165 Type 2 diabetes mellitus with hyperglycemia: Secondary | ICD-10-CM | POA: Diagnosis present

## 2015-10-28 DIAGNOSIS — Z79899 Other long term (current) drug therapy: Secondary | ICD-10-CM | POA: Diagnosis not present

## 2015-10-28 DIAGNOSIS — Z7984 Long term (current) use of oral hypoglycemic drugs: Secondary | ICD-10-CM | POA: Insufficient documentation

## 2015-10-28 DIAGNOSIS — Z8719 Personal history of other diseases of the digestive system: Secondary | ICD-10-CM | POA: Insufficient documentation

## 2015-10-28 DIAGNOSIS — Z794 Long term (current) use of insulin: Secondary | ICD-10-CM | POA: Diagnosis not present

## 2015-10-28 DIAGNOSIS — R739 Hyperglycemia, unspecified: Secondary | ICD-10-CM

## 2015-10-28 HISTORY — DX: Essential (primary) hypertension: I10

## 2015-10-28 LAB — URINALYSIS, ROUTINE W REFLEX MICROSCOPIC
Glucose, UA: 250 mg/dL — AB
Hgb urine dipstick: NEGATIVE
KETONES UR: 15 mg/dL — AB
LEUKOCYTES UA: NEGATIVE
NITRITE: NEGATIVE
PH: 5.5 (ref 5.0–8.0)
Protein, ur: 100 mg/dL — AB
Specific Gravity, Urine: 1.019 (ref 1.005–1.030)

## 2015-10-28 LAB — CBC
HEMATOCRIT: 31.3 % — AB (ref 39.0–52.0)
HEMOGLOBIN: 10.3 g/dL — AB (ref 13.0–17.0)
MCH: 33.2 pg (ref 26.0–34.0)
MCHC: 32.9 g/dL (ref 30.0–36.0)
MCV: 101 fL — ABNORMAL HIGH (ref 78.0–100.0)
Platelets: 245 10*3/uL (ref 150–400)
RBC: 3.1 MIL/uL — ABNORMAL LOW (ref 4.22–5.81)
RDW: 12.5 % (ref 11.5–15.5)
WBC: 6 10*3/uL (ref 4.0–10.5)

## 2015-10-28 LAB — URINE MICROSCOPIC-ADD ON: RBC / HPF: NONE SEEN RBC/hpf (ref 0–5)

## 2015-10-28 LAB — CBG MONITORING, ED
GLUCOSE-CAPILLARY: 277 mg/dL — AB (ref 65–99)
GLUCOSE-CAPILLARY: 252 mg/dL — AB (ref 65–99)
Glucose-Capillary: 186 mg/dL — ABNORMAL HIGH (ref 65–99)

## 2015-10-28 LAB — BASIC METABOLIC PANEL
Anion gap: 12 (ref 5–15)
BUN: 26 mg/dL — AB (ref 6–20)
CALCIUM: 9.4 mg/dL (ref 8.9–10.3)
CO2: 20 mmol/L — ABNORMAL LOW (ref 22–32)
CREATININE: 2.23 mg/dL — AB (ref 0.61–1.24)
Chloride: 101 mmol/L (ref 101–111)
GFR, EST AFRICAN AMERICAN: 33 mL/min — AB (ref >60)
GFR, EST NON AFRICAN AMERICAN: 29 mL/min — AB (ref >60)
Glucose, Bld: 270 mg/dL — ABNORMAL HIGH (ref 65–99)
Potassium: 5.5 mmol/L — ABNORMAL HIGH (ref 3.5–5.1)
SODIUM: 133 mmol/L — AB (ref 135–145)

## 2015-10-28 MED ORDER — INSULIN ASPART PROT & ASPART (70-30 MIX) 100 UNIT/ML PEN: PEN_INJECTOR | SUBCUTANEOUS | Status: DC

## 2015-10-28 MED ORDER — "PEN NEEDLES 3/16"" 31G X 5 MM MISC": Status: DC

## 2015-10-28 MED ORDER — INSULIN ASPART 100 UNIT/ML ~~LOC~~ SOLN
5.0000 [IU] | Freq: Once | SUBCUTANEOUS | Status: AC
  Administered 2015-10-28: 5 [IU] via INTRAVENOUS
  Filled 2015-10-28: qty 1

## 2015-10-28 MED FILL — !NOVOLOG 70/30 FLEXPEN: 70-30 | 30 days supply | Qty: 6 | Fill #0

## 2015-10-28 NOTE — ED Notes (Signed)
BNP grossly hemolyzed RN aware

## 2015-10-28 NOTE — ED Notes (Signed)
Pt states understanding of instructions,

## 2015-10-28 NOTE — Care Management (Signed)
CM met with patient at bedside, patient reports not being able to afford his insulin, patient does not have Medicare part D. CM discussed that patient would need to apply for Medicaid Supplemental for assistance with prescriptions. Patient states he goes off and on to [facility] but he not able to afford to continue to go there. Discussed that [facility] with patient, he is aware and has been there and used their pharmacy in the past. Discussed possibly reestablishing care with the [facility] and pharmacy for medication assistance, patient is agreeable.  Discussed the TCC and the services this patient would benefit from the TCC, he has agreed to the program.  Patient states he has all of his medications except his insulin. CM contacted [facility] pharmacy about insulin, they will accommodate him with 30 days of insulin today, after scheduling an appointment with a provider at the clinic.  Follow up appt to establish care tomorrow 4/21 at 2p. CM will also contact [facility] to follow up.  Teach back done, patient verbalized understanding. Patient has given an alternate contact information Cornelia Copa [phone] Friend) patient's phone not working. No further questions or concerns verbalized. Updated Millie RN on Pod A.

## 2015-10-28 NOTE — ED Notes (Addendum)
MD at bedside. 

## 2015-10-28 NOTE — Discharge Instructions (Signed)
Continue your diabetic medications as previously prescribed.  Follow-up with your primary Dr. for future refills.   Hyperglycemia Hyperglycemia occurs when the glucose (sugar) in your blood is too high. Hyperglycemia can happen for many reasons, but it most often happens to people who do not know they have diabetes or are not managing their diabetes properly.  CAUSES  Whether you have diabetes or not, there are other causes of hyperglycemia. Hyperglycemia can occur when you have diabetes, but it can also occur in other situations that you might not be as aware of, such as: Diabetes  If you have diabetes and are having problems controlling your blood glucose, hyperglycemia could occur because of some of the following reasons:  Not following your meal plan.  Not taking your diabetes medications or not taking it properly.  Exercising less or doing less activity than you normally do.  Being sick. Pre-diabetes  This cannot be ignored. Before people develop Type 2 diabetes, they almost always have "pre-diabetes." This is when your blood glucose levels are higher than normal, but not yet high enough to be diagnosed as diabetes. Research has shown that some long-term damage to the body, especially the heart and circulatory system, may already be occurring during pre-diabetes. If you take action to manage your blood glucose when you have pre-diabetes, you may delay or prevent Type 2 diabetes from developing. Stress  If you have diabetes, you may be "diet" controlled or on oral medications or insulin to control your diabetes. However, you may find that your blood glucose is higher than usual in the hospital whether you have diabetes or not. This is often referred to as "stress hyperglycemia." Stress can elevate your blood glucose. This happens because of hormones put out by the body during times of stress. If stress has been the cause of your high blood glucose, it can be followed regularly by your  caregiver. That way he/she can make sure your hyperglycemia does not continue to get worse or progress to diabetes. Steroids  Steroids are medications that act on the infection fighting system (immune system) to block inflammation or infection. One side effect can be a rise in blood glucose. Most people can produce enough extra insulin to allow for this rise, but for those who cannot, steroids make blood glucose levels go even higher. It is not unusual for steroid treatments to "uncover" diabetes that is developing. It is not always possible to determine if the hyperglycemia will go away after the steroids are stopped. A special blood test called an A1c is sometimes done to determine if your blood glucose was elevated before the steroids were started. SYMPTOMS  Thirsty.  Frequent urination.  Dry mouth.  Blurred vision.  Tired or fatigue.  Weakness.  Sleepy.  Tingling in feet or leg. DIAGNOSIS  Diagnosis is made by monitoring blood glucose in one or all of the following ways:  A1c test. This is a chemical found in your blood.  Fingerstick blood glucose monitoring.  Laboratory results. TREATMENT  First, knowing the cause of the hyperglycemia is important before the hyperglycemia can be treated. Treatment may include, but is not be limited to:  Education.  Change or adjustment in medications.  Change or adjustment in meal plan.  Treatment for an illness, infection, etc.  More frequent blood glucose monitoring.  Change in exercise plan.  Decreasing or stopping steroids.  Lifestyle changes. HOME CARE INSTRUCTIONS   Test your blood glucose as directed.  Exercise regularly. Your caregiver will give you  instructions about exercise. Pre-diabetes or diabetes which comes on with stress is helped by exercising.  Eat wholesome, balanced meals. Eat often and at regular, fixed times. Your caregiver or nutritionist will give you a meal plan to guide your sugar intake.  Being at  an ideal weight is important. If needed, losing as little as 10 to 15 pounds may help improve blood glucose levels. SEEK MEDICAL CARE IF:   You have questions about medicine, activity, or diet.  You continue to have symptoms (problems such as increased thirst, urination, or weight gain). SEEK IMMEDIATE MEDICAL CARE IF:   You are vomiting or have diarrhea.  Your breath smells fruity.  You are breathing faster or slower.  You are very sleepy or incoherent.  You have numbness, tingling, or pain in your feet or hands.  You have chest pain.  Your symptoms get worse even though you have been following your caregiver's orders.  If you have any other questions or concerns.   This information is not intended to replace advice given to you by your health care provider. Make sure you discuss any questions you have with your health care provider.   Document Released: 12/20/2000 Document Revised: 09/18/2011 Document Reviewed: 03/02/2015 Elsevier Interactive Patient Education [employer].

## 2015-10-28 NOTE — ED Provider Notes (Signed)
CSN: [id number]     Arrival date & time 10/28/15  1255 History   First MD Initiated Contact with Patient 10/28/15 1507     Chief Complaint  Patient presents with  . Hyperglycemia     (Consider location/radiation/quality/duration/timing/severity/associated sxs/prior Treatment) HPI Comments: Patient is a 70 year old male with history of diabetes, hypertension. He presents for evaluation of elevated blood sugar. He states he checked his blood sugar this morning it was 275. States that this is high for him. He is supposed to be taking NovoLog 70/30, however he ran out of this several days ago and cannot afford to get his prescription refilled. States that his co-pay went up. He denies any chest pain or shortness of breath. He denies any other symptoms.  Patient is a 70 y.o. male presenting with hyperglycemia. The history is provided by the patient.  Hyperglycemia Blood sugar level PTA:  275 Severity:  Moderate Onset quality:  Gradual Duration:  2 days Timing:  Constant Progression:  Worsening Chronicity:  New Diabetes status:  Controlled with insulin Context comment:  Ran out of his medication/can't afford Relieved by:  Nothing Ineffective treatments:  None tried   Past Medical History  Diagnosis Date  . ETOH abuse   . GERD (gastroesophageal reflux disease)   . Type II or unspecified type diabetes mellitus without mention of complication, not stated as uncontrolled 07/29/2013  . Hypertension    History reviewed. No pertinent past surgical history. No family history on file. Social History  Substance Use Topics  . Smoking status: Current Every Day Smoker -- 0.50 packs/day    Types: Cigarettes  . Smokeless tobacco: None  . Alcohol Use: Yes     Comment: not in last three days    Review of Systems  All other systems reviewed and are negative.     Allergies  Review of patient's allergies indicates no known allergies.  Home Medications   Prior to Admission medications    Medication Sig Start Date End Date Taking? Authorizing Provider  amLODipine (NORVASC) 10 MG tablet Take 1 tablet (10 mg total) by mouth daily. 04/01/15   Smiley Houseman, MD  gabapentin (NEURONTIN) 300 MG capsule Take 1 capsule (300 mg total) by mouth at bedtime. 04/01/15   Smiley Houseman, MD  insulin aspart protamine - aspart (NOVOLOG 70/30 MIX) (70-30) 100 UNIT/ML FlexPen Please take 10units before breakfast and 10 units before supper everyday. 04/02/15   Smiley Houseman, MD  Insulin Pen Needle (PEN NEEDLES 3/16") 31G X 5 MM MISC Use one needle twice a day as directed. 04/01/15   Smiley Houseman, MD  lisinopril (PRINIVIL,ZESTRIL) 5 MG tablet Take 1 tablet (5 mg total) by mouth daily. 04/01/15   Smiley Houseman, MD  metFORMIN (GLUCOPHAGE) 500 MG tablet Take 1 tablet (500 mg total) by mouth daily with breakfast. 04/01/15   Smiley Houseman, MD  Multiple Vitamin (MULTIVITAMIN WITH MINERALS) TABS tablet Take 1 tablet by mouth daily. 07/29/13   Eugenie Filler, MD  nicotine (NICODERM CQ - DOSED IN MG/24 HOURS) 14 mg/24hr patch Place 1 patch (14 mg total) onto the skin daily. 04/01/15   Smiley Houseman, MD  thiamine 100 MG tablet Take 1 tablet (100 mg total) by mouth daily. 07/29/13   Eugenie Filler, MD   BP 120/68 mmHg  Pulse 95  Temp(Src) 98.5 F (36.9 C) (Oral)  Resp 17  Ht 5\' 5"  (1.651 m)  Wt 160 lb (72.576 kg)  BMI 26.63 kg/m2  SpO2 100% Physical Exam  Constitutional: He is oriented to person, place, and time. He appears well-developed and well-nourished. No distress.  HENT:  Head: Normocephalic and atraumatic.  Mouth/Throat: Oropharynx is clear and moist.  Neck: Normal range of motion. Neck supple.  Cardiovascular: Normal rate, regular rhythm and normal heart sounds.   No murmur heard. Pulmonary/Chest: Effort normal and breath sounds normal. No respiratory distress. He has no wheezes.  Abdominal: Soft. Bowel sounds are normal.  Musculoskeletal: Normal range  of motion. He exhibits no edema.  Neurological: He is alert and oriented to person, place, and time.  Skin: Skin is warm and dry. He is not diaphoretic.  Nursing note and vitals reviewed.   ED Course  Procedures (including critical care time) Labs Review Labs Reviewed  CBC - Abnormal; Notable for the following:    RBC 3.10 (*)    Hemoglobin 10.3 (*)    HCT 31.3 (*)    MCV 101.0 (*)    All other components within normal limits  CBG MONITORING, ED - Abnormal; Notable for the following:    Glucose-Capillary 277 (*)    All other components within normal limits  URINALYSIS, ROUTINE W REFLEX MICROSCOPIC (NOT AT [facility])  BASIC METABOLIC PANEL  CBG MONITORING, ED    Imaging Review No results found. I have personally reviewed and evaluated these images and lab results as part of my medical decision-making.   EKG Interpretation None      MDM   Final diagnoses:  None    Patient presents with complaints of elevated blood sugar. He has a history of known diabetes, however is not had his insulin in several days as he has run out and does not have the financial means to refill the prescription. His blood sugar is in the 200s and improved with subcutaneous insulin. He has no complaints and I see no indication for further workup. His laboratory studies show no evidence for ketoacidosis or other emergent issue.  He was evaluated by case management and arrangements were made for this patient to have his insulin refilled. He will be discharged with continued medications as before.    Veryl Speak, MD 10/28/15 [phone]

## 2015-10-28 NOTE — Care Management (Signed)
ED CM received consult concerning medication assistance. Patient has Medicare and is not eligible for medication assistance under CM guidelines.  CM will continue to follow

## 2015-10-28 NOTE — ED Notes (Signed)
Patient states he checked his sugar this morning and was 220.   Patient states this is very high for him.   Patient states he doesn't feel bad at all.   Patient states he can't afford his insulin.   Patient is out of his insulin.

## 2015-10-29 ENCOUNTER — Inpatient Hospital Stay: Payer: Medicare Other

## 2015-10-29 ENCOUNTER — Telehealth: Payer: Self-pay

## 2015-10-29 NOTE — Telephone Encounter (Signed)
This Case Manager received communication from Laurena Slimmer, ED RN CM who indicated patient needed follow-up regarding Medicare Part D resources, and she also thought patient may qualify for [facility]. Patient does not meet criteria for the [facility] as he has only had 1 admission/1 ED visit in the last year. This Case Manager placed call to patient and reminded him of appointment today at 1100 at [place]. Reminded patient of available pharmacy resources at [place]. Discussed importance of applying for Medicaid supplemental for assist with prescriptions and also provided patient with [facility] Counselor contact information [phone] ext [phone]). Patient appreciative of information and indicated he planned to be at his appointment today at 1100. No additional needs/concerns identified.

## 2015-11-01 ENCOUNTER — Other Ambulatory Visit: Payer: Self-pay

## 2015-11-01 ENCOUNTER — Encounter: Payer: Self-pay | Admitting: Clinical

## 2015-11-01 ENCOUNTER — Encounter: Payer: Self-pay | Admitting: Internal Medicine

## 2015-11-01 ENCOUNTER — Ambulatory Visit: Payer: Medicare Other | Attending: Internal Medicine | Admitting: Internal Medicine

## 2015-11-01 VITALS — BP 129/77 | HR 90 | Temp 98.0°F | Resp 18 | Ht 65.0 in | Wt 155.0 lb

## 2015-11-01 DIAGNOSIS — D538 Other specified nutritional anemias: Secondary | ICD-10-CM | POA: Diagnosis not present

## 2015-11-01 DIAGNOSIS — N179 Acute kidney failure, unspecified: Secondary | ICD-10-CM | POA: Diagnosis not present

## 2015-11-01 DIAGNOSIS — Z1211 Encounter for screening for malignant neoplasm of colon: Secondary | ICD-10-CM

## 2015-11-01 DIAGNOSIS — K219 Gastro-esophageal reflux disease without esophagitis: Secondary | ICD-10-CM | POA: Insufficient documentation

## 2015-11-01 DIAGNOSIS — E1310 Other specified diabetes mellitus with ketoacidosis without coma: Secondary | ICD-10-CM

## 2015-11-01 DIAGNOSIS — Z79899 Other long term (current) drug therapy: Secondary | ICD-10-CM | POA: Diagnosis not present

## 2015-11-01 DIAGNOSIS — F17210 Nicotine dependence, cigarettes, uncomplicated: Secondary | ICD-10-CM | POA: Diagnosis not present

## 2015-11-01 DIAGNOSIS — F1010 Alcohol abuse, uncomplicated: Secondary | ICD-10-CM

## 2015-11-01 DIAGNOSIS — E1121 Type 2 diabetes mellitus with diabetic nephropathy: Secondary | ICD-10-CM | POA: Insufficient documentation

## 2015-11-01 DIAGNOSIS — Z794 Long term (current) use of insulin: Secondary | ICD-10-CM | POA: Diagnosis not present

## 2015-11-01 DIAGNOSIS — I10 Essential (primary) hypertension: Secondary | ICD-10-CM

## 2015-11-01 DIAGNOSIS — E0821 Diabetes mellitus due to underlying condition with diabetic nephropathy: Secondary | ICD-10-CM | POA: Diagnosis not present

## 2015-11-01 DIAGNOSIS — R972 Elevated prostate specific antigen [PSA]: Secondary | ICD-10-CM

## 2015-11-01 LAB — CMP AND LIVER
ALBUMIN: 4.2 g/dL (ref 3.6–5.1)
ALK PHOS: 62 U/L (ref 40–115)
ALT: 9 U/L (ref 9–46)
AST: 15 U/L (ref 10–35)
BILIRUBIN TOTAL: 0.8 mg/dL (ref 0.2–1.2)
BUN: 28 mg/dL — AB (ref 7–25)
Bilirubin, Direct: 0.2 mg/dL (ref <=0.2)
CALCIUM: 9.2 mg/dL (ref 8.6–10.3)
CO2: 19 mmol/L — ABNORMAL LOW (ref 20–31)
CREATININE: 1.69 mg/dL — AB (ref 0.70–1.25)
Chloride: 102 mmol/L (ref 98–110)
Glucose, Bld: 43 mg/dL — ABNORMAL LOW (ref 65–99)
Indirect Bilirubin: 0.6 mg/dL (ref 0.2–1.2)
Potassium: 4.5 mmol/L (ref 3.5–5.3)
Sodium: 135 mmol/L (ref 135–146)
TOTAL PROTEIN: 7.8 g/dL (ref 6.1–8.1)

## 2015-11-01 LAB — POCT GLYCOSYLATED HEMOGLOBIN (HGB A1C): HEMOGLOBIN A1C: 10

## 2015-11-01 LAB — IRON,TIBC AND FERRITIN PANEL
%SAT: 30 % (ref 15–60)
Ferritin: 597 ng/mL — ABNORMAL HIGH (ref 20–380)
Iron: 89 ug/dL (ref 50–180)
TIBC: 298 ug/dL (ref 250–425)

## 2015-11-01 LAB — CREATININE WITH EST GFR
CREATININE: 1.69 mg/dL — AB (ref 0.70–1.25)
GFR, Est African American: 48 mL/min — ABNORMAL LOW (ref >=60)
GFR, Est Non African American: 41 mL/min — ABNORMAL LOW (ref >=60)

## 2015-11-01 LAB — GLUCOSE, POCT (MANUAL RESULT ENTRY): POC GLUCOSE: 51 mg/dL — AB (ref 70–99)

## 2015-11-01 MED ORDER — TRUE METRIX GO GLUCOSE METER W/DEVICE KIT: 1.0000 | PACK | Freq: Three times a day (TID) | Status: DC | PRN

## 2015-11-01 MED ORDER — TRUEPLUS LANCETS 26G MISC: 1.0000 | Freq: Three times a day (TID) | Status: DC | PRN

## 2015-11-01 MED ORDER — GABAPENTIN 300 MG PO CAPS: 300.0000 mg | ORAL_CAPSULE | Freq: Two times a day (BID) | ORAL | Status: DC

## 2015-11-01 MED ORDER — METFORMIN HCL 500 MG PO TABS: 500.0000 mg | ORAL_TABLET | Freq: Two times a day (BID) | ORAL | Status: DC

## 2015-11-01 MED ORDER — GLUCOSE BLOOD VI STRP: ORAL_STRIP | Status: DC

## 2015-11-01 MED FILL — GABAPENTIN 300 MG CAPSULE: 300 | 30 days supply | Qty: 60 | Fill #0

## 2015-11-01 MED FILL — metFORMIN HCL 500 MG TABS: 500 | 30 days supply | Qty: 60 | Fill #0

## 2015-11-01 MED FILL — TRUEplus LANCETS 28G MISC: 33 days supply | Qty: 100 | Fill #0

## 2015-11-01 MED FILL — !TRUE METRIX BLOOD GLUCOSE: 30 days supply | Qty: 1 | Fill #0

## 2015-11-01 MED FILL — TRUE METRIX TEST STRIP: 25 days supply | Qty: 100 | Fill #0

## 2015-11-01 NOTE — Progress Notes (Signed)
Depression screen [facility] 2/9 11/01/2015  Decreased Interest 0  Down, Depressed, Hopeless 0  PHQ - 2 Score 0  Altered sleeping 2  Tired, decreased energy 3  Change in appetite 0  Feeling bad or failure about yourself  0  Trouble concentrating 0  Moving slowly or fidgety/restless 0  Suicidal thoughts 0  PHQ-9 Score 5    GAD 7 : Generalized Anxiety Score 11/01/2015  Nervous, Anxious, on Edge 0  Control/stop worrying 0  Worry too much - different things 0  Trouble relaxing 0  Restless 0  Easily annoyed or irritable 0  Afraid - awful might happen 0  Total GAD 7 Score 0

## 2015-11-01 NOTE — Progress Notes (Signed)
Patient is here for HFU for Hyperglycemia  Patient denies pain at this time.  Patient has taken medications today and patient has eaten today.

## 2015-11-01 NOTE — Patient Instructions (Signed)
DASH Eating Plan DASH stands for "Dietary Approaches to Stop Hypertension." The DASH eating plan is a healthy eating plan that has been shown to reduce high blood pressure (hypertension). Additional health benefits may include reducing the risk of type 2 diabetes mellitus, heart disease, and stroke. The DASH eating plan may also help with weight loss. WHAT DO I NEED TO KNOW ABOUT THE DASH EATING PLAN? For the DASH eating plan, you will follow these general guidelines:  Choose foods with a percent daily value for sodium of less than 5% (as listed on the food label).  Use salt-free seasonings or herbs instead of table salt or sea salt.  Check with your health care provider or pharmacist before using salt substitutes.  Eat lower-sodium products, often labeled as "lower sodium" or "no salt added."  Eat fresh foods.  Eat more vegetables, fruits, and low-fat dairy products.  Choose whole grains. Look for the word "whole" as the first word in the ingredient list.  Choose fish and skinless chicken or turkey more often than red meat. Limit fish, poultry, and meat to 6 oz (170 g) each day.  Limit sweets, desserts, sugars, and sugary drinks.  Choose heart-healthy fats.  Limit cheese to 1 oz (28 g) per day.  Eat more home-cooked food and less restaurant, buffet, and fast food.  Limit fried foods.  Cook foods using methods other than frying.  Limit canned vegetables. If you do use them, rinse them well to decrease the sodium.  When eating at a restaurant, ask that your food be prepared with less salt, or no salt if possible. WHAT FOODS CAN I EAT? Seek help from a dietitian for individual calorie needs. Grains Whole grain or whole wheat bread. Brown rice. Whole grain or whole wheat pasta. Quinoa, bulgur, and whole grain cereals. Low-sodium cereals. Corn or whole wheat flour tortillas. Whole grain cornbread. Whole grain crackers. Low-sodium crackers. Vegetables Fresh or frozen vegetables  (raw, steamed, roasted, or grilled). Low-sodium or reduced-sodium tomato and vegetable juices. Low-sodium or reduced-sodium tomato sauce and paste. Low-sodium or reduced-sodium canned vegetables.  Fruits All fresh, canned (in natural juice), or frozen fruits. Meat and Other Protein Products Ground beef (85% or leaner), grass-fed beef, or beef trimmed of fat. Skinless chicken or turkey. Ground chicken or turkey. Pork trimmed of fat. All fish and seafood. Eggs. Dried beans, peas, or lentils. Unsalted nuts and seeds. Unsalted canned beans. Dairy Low-fat dairy products, such as skim or 1% milk, 2% or reduced-fat cheeses, low-fat ricotta or cottage cheese, or plain low-fat yogurt. Low-sodium or reduced-sodium cheeses. Fats and Oils Tub margarines without trans fats. Light or reduced-fat mayonnaise and salad dressings (reduced sodium). Avocado. Safflower, olive, or canola oils. Natural peanut or almond butter. Other Unsalted popcorn and pretzels. The items listed above may not be a complete list of recommended foods or beverages. Contact your dietitian for more options. WHAT FOODS ARE NOT RECOMMENDED? Grains White bread. White pasta. White rice. Refined cornbread. Bagels and croissants. Crackers that contain trans fat. Vegetables Creamed or fried vegetables. Vegetables in a cheese sauce. Regular canned vegetables. Regular canned tomato sauce and paste. Regular tomato and vegetable juices. Fruits Dried fruits. Canned fruit in light or heavy syrup. Fruit juice. Meat and Other Protein Products Fatty cuts of meat. Ribs, chicken wings, bacon, sausage, bologna, salami, chitterlings, fatback, hot dogs, bratwurst, and packaged luncheon meats. Salted nuts and seeds. Canned beans with salt. Dairy Whole or 2% milk, cream, half-and-half, and cream cheese. Whole-fat or sweetened yogurt. Full-fat   cheeses or blue cheese. Nondairy creamers and whipped toppings. Processed cheese, cheese spreads, or cheese  curds. Condiments Onion and garlic salt, seasoned salt, table salt, and sea salt. Canned and packaged gravies. Worcestershire sauce. Tartar sauce. Barbecue sauce. Teriyaki sauce. Soy sauce, including reduced sodium. Steak sauce. Fish sauce. Oyster sauce. Cocktail sauce. Horseradish. Ketchup and mustard. Meat flavorings and tenderizers. Bouillon cubes. Hot sauce. Tabasco sauce. Marinades. Taco seasonings. Relishes. Fats and Oils Butter, stick margarine, lard, shortening, ghee, and bacon fat. Coconut, palm kernel, or palm oils. Regular salad dressings. Other Pickles and olives. Salted popcorn and pretzels. The items listed above may not be a complete list of foods and beverages to avoid. Contact your dietitian for more information. WHERE CAN I FIND MORE INFORMATION? National Heart, Lung, and Blood Institute: www.nhlbi.nih.gov/health/health-topics/topics/dash/   This information is not intended to replace advice given to you by your health care provider. Make sure you discuss any questions you have with your health care provider.   Document Released: 06/15/2011 Document Revised: 07/17/2014 Document Reviewed: 04/30/2013 Elsevier Interactive Patient Education 2016 Elsevier Inc.  -Diabetes Mellitus and Food It is important for you to manage your blood sugar (glucose) level. Your blood glucose level can be greatly affected by what you eat. Eating healthier foods in the appropriate amounts throughout the day at about the same time each day will help you control your blood glucose level. It can also help slow or prevent worsening of your diabetes mellitus. Healthy eating may even help you improve the level of your blood pressure and reach or maintain a healthy weight.  General recommendations for healthful eating and cooking habits include:  Eating meals and snacks regularly. Avoid going long periods of time without eating to lose weight.  Eating a diet that consists mainly of plant-based foods, such  as fruits, vegetables, nuts, legumes, and whole grains.  Using low-heat cooking methods, such as baking, instead of high-heat cooking methods, such as deep frying. Work with your dietitian to make sure you understand how to use the Nutrition Facts information on food labels. HOW CAN FOOD AFFECT ME? Carbohydrates Carbohydrates affect your blood glucose level more than any other type of food. Your dietitian will help you determine how many carbohydrates to eat at each meal and teach you how to count carbohydrates. Counting carbohydrates is important to keep your blood glucose at a healthy level, especially if you are using insulin or taking certain medicines for diabetes mellitus. Alcohol Alcohol can cause sudden decreases in blood glucose (hypoglycemia), especially if you use insulin or take certain medicines for diabetes mellitus. Hypoglycemia can be a life-threatening condition. Symptoms of hypoglycemia (sleepiness, dizziness, and disorientation) are similar to symptoms of having too much alcohol.  If your health care provider has given you approval to drink alcohol, do so in moderation and use the following guidelines:  Women should not have more than one drink per day, and men should not have more than two drinks per day. One drink is equal to:  12 oz of beer.  5 oz of wine.  1 oz of hard liquor.  Do not drink on an empty stomach.  Keep yourself hydrated. Have water, diet soda, or unsweetened iced tea.  Regular soda, juice, and other mixers might contain a lot of carbohydrates and should be counted. WHAT FOODS ARE NOT RECOMMENDED? As you make food choices, it is important to remember that all foods are not the same. Some foods have fewer nutrients per serving than other foods, even though   they might have the same number of calories or carbohydrates. It is difficult to get your body what it needs when you eat foods with fewer nutrients. Examples of foods that you should avoid that are  high in calories and carbohydrates but low in nutrients include:  Trans fats (most processed foods list trans fats on the Nutrition Facts label).  Regular soda.  Juice.  Candy.  Sweets, such as cake, pie, doughnuts, and cookies.  Fried foods. WHAT FOODS CAN I EAT? Eat nutrient-rich foods, which will nourish your body and keep you healthy. The food you should eat also will depend on several factors, including:  The calories you need.  The medicines you take.  Your weight.  Your blood glucose level.  Your blood pressure level.  Your cholesterol level. You should eat a variety of foods, including:  Protein.  Lean cuts of meat.  Proteins low in saturated fats, such as fish, egg whites, and beans. Avoid processed meats.  Fruits and vegetables.  Fruits and vegetables that may help control blood glucose levels, such as apples, mangoes, and yams.  Dairy products.  Choose fat-free or low-fat dairy products, such as milk, yogurt, and cheese.  Grains, bread, pasta, and rice.  Choose whole grain products, such as multigrain bread, whole oats, and brown rice. These foods may help control blood pressure.  Fats.  Foods containing healthful fats, such as nuts, avocado, olive oil, canola oil, and fish. DOES EVERYONE WITH DIABETES MELLITUS HAVE THE SAME MEAL PLAN? Because every person with diabetes mellitus is different, there is not one meal plan that works for everyone. It is very important that you meet with a dietitian who will help you create a meal plan that is just right for you.   This information is not intended to replace advice given to you by your health care provider. Make sure you discuss any questions you have with your health care provider.   Document Released: 03/23/2005 Document Revised: 07/17/2014 Document Reviewed: 05/23/2013 Elsevier Interactive Patient Education 2016 Elsevier Inc.   

## 2015-11-01 NOTE — Patient Outreach (Signed)
Unsuccessful attempt to make contact with patient via telephone at [phone], lady who answered telephone stated patient does not live at this address anymore, however, she did verify the  [facility] address as being his current address.  Call made to [phone], listed as cell number.  Lady answered telephone and informed this RNCM this was not patient's number any longer.    Plan: Will send unable to reach letter, request patient to call this RNCM

## 2015-11-01 NOTE — Progress Notes (Signed)
Robert Sparks, is a 70 y.o. male  VN:[id number]  [id number]:[id number]  DOB - Aug 06, 1945  CC:  Chief Complaint  Patient presents with  . Hospitalization Follow-up    Hyperglycemia       HPI: Robert Sparks is a 70 y.o. AA  male here today to establish medical care.  New to our clinic.  Sp ED visit 4/20 for hyperglycemia, ran out of his Novolog 70/30 for few days.  No c/o today, except dizziness at times when gets up to much. Still drinking, about 40oz etoh this weekend,  Still smokes 1-2 cigs/day.  Understands should stop both.  States he is taking all his meds as prescribed.  No other c/o today.  Eating ok, watches his salt.  Asked specific questions about ADA diet today and what foods he should/shouldn't eat.  Also asked for new glucometer, cannot find his old one.  He ate breakfast this am.  Mentions he had abnormal PSA in past, was suppose to f/u w/ Urologist, but somehow never did.  Has not had an eye exam in long time, never had colonoscopy.    Per pt, currently taking metformin 500bid, did eat breakfast.  Patient has No headache, No chest pain, No abdominal pain - No Nausea, No new weakness tingling or numbness, No Cough - SOB.  No Known Allergies Past Medical History  Diagnosis Date  . ETOH abuse   . GERD (gastroesophageal reflux disease)   . Type II or unspecified type diabetes mellitus without mention of complication, not stated as uncontrolled 07/29/2013  . Hypertension    Current Outpatient Prescriptions on File Prior to Visit  Medication Sig Dispense Refill  . amLODipine (NORVASC) 10 MG tablet Take 1 tablet (10 mg total) by mouth daily. 30 tablet 0  . insulin aspart protamine - aspart (NOVOLOG 70/30 MIX) (70-30) 100 UNIT/ML FlexPen Please take 10units before breakfast and 10 units before supper everyday. 6 mL 2  . Insulin Pen Needle (PEN NEEDLES 3/16") 31G X 5 MM MISC Use one needle twice a day as directed. 30 each 6  . lisinopril (PRINIVIL,ZESTRIL) 5 MG tablet Take 1 tablet  (5 mg total) by mouth daily. (Patient taking differently: Take 10 mg by mouth daily. ) 30 tablet 0  . Multiple Vitamin (MULTIVITAMIN WITH MINERALS) TABS tablet Take 1 tablet by mouth daily.    . nicotine (NICODERM CQ - DOSED IN MG/24 HOURS) 14 mg/24hr patch Place 1 patch (14 mg total) onto the skin daily. 28 patch 0  . thiamine 100 MG tablet Take 1 tablet (100 mg total) by mouth daily.     No current facility-administered medications on file prior to visit.   History reviewed. No pertinent family history. Social History   Social History  . Marital Status: Single    Spouse Name: N/A  . Number of Children: N/A  . Years of Education: N/A   Occupational History  . Not on file.   Social History Main Topics  . Smoking status: Current Every Day Smoker -- 0.50 packs/day    Types: Cigarettes  . Smokeless tobacco: Not on file  . Alcohol Use: Yes     Comment: not in last three days  . Drug Use: No  . Sexual Activity: Not on file   Other Topics Concern  . Not on file   Social History Narrative    Review of Systems: Constitutional: Negative for fever, chills, diaphoresis, activity change, appetite change and fatigue. HENT: Negative for ear pain, nosebleeds, congestion, facial swelling,  rhinorrhea, neck pain, neck stiffness and ear discharge.  Eyes: Negative for pain, discharge, redness, itching and visual disturbance. Respiratory: Negative for cough, choking, chest tightness, shortness of breath, wheezing and stridor.  Cardiovascular: Negative for chest pain, palpitations and leg swelling. Gastrointestinal: Negative for abdominal distention. Genitourinary: Negative for dysuria, urgency, frequency, hematuria, flank pain, decreased urine volume, difficulty urinating and dyspareunia.   Denies brbpr, melena/hemaotchezia. Musculoskeletal: Negative for back pain, joint swelling, arthralgia and gait problem. Neurological: Negative for dizziness, tremors, seizures, syncope, facial asymmetry,  speech difficulty, weakness, light-headedness, numbness and headaches.  Hematological: Negative for adenopathy. Does not bruise/bleed easily. Psychiatric/Behavioral: Negative for hallucinations, behavioral problems, confusion, dysphoric mood, decreased concentration and agitation.    Objective:   Filed Vitals:   11/01/15 0920  BP: 129/77  Pulse: 90  Temp: 98 F (36.7 C)  Resp: 18   +chaperone w/ CMA during exam.  Physical Exam: Constitutional: Patient appears well-developed and well-nourished. No distress. AAOx3, thin appearing. HENT: Normocephalic, atraumatic, External right and left ear normal. Oropharynx is clear and moist.  Eyes: Conjunctivae and EOM are normal. PERRL, no scleral icterus. Neck: Normal ROM. Neck supple. No JVD. No tracheal deviation. No thyromegaly. CVS: RRR, S1/S2 +, no murmurs, no gallops, no carotid bruit.  Pulmonary: Effort and breath sounds normal, no stridor, rhonchi, wheezes, rales.  Abdominal: Soft. BS +, no distension, tenderness, rebound or guarding.  Musculoskeletal: Normal range of motion. No edema and no tenderness.  LE: bilat/ no c/c/e, pulses 2+ bilateral. Lymphadenopathy: No lymphadenopathy noted, cervical Gu:  Normal prostate caliber, no nodules noted. No hemorrhoids.  Nttp.  Neuro: Alert. Normal reflexes, muscle tone coordination. No cranial nerve deficit grossly. Skin: Skin is warm and dry. No rash noted. Not diaphoretic. No erythema. No pallor. Psychiatric: Normal mood and affect. Behavior, judgment, thought content normal.  Lab Results  Component Value Date   WBC 6.0 10/28/2015   HGB 10.3* 10/28/2015   HCT 31.3* 10/28/2015   MCV 101.0* 10/28/2015   PLT 245 10/28/2015   Lab Results  Component Value Date   CREATININE 2.23* 10/28/2015   BUN 26* 10/28/2015   NA 133* 10/28/2015   K 5.5* 10/28/2015   CL 101 10/28/2015   CO2 20* 10/28/2015    Lab Results  Component Value Date   HGBA1C 10.0 11/01/2015   Lipid Panel       Component Value Date/Time   CHOL 294* 03/30/2015 0650   TRIG 236* 03/30/2015 0650   HDL 42 03/30/2015 0650   CHOLHDL 7.0 03/30/2015 0650   VLDL 47* 03/30/2015 0650   LDLCALC 205* 03/30/2015 0650     FOBT - negative Assessment and plan:   1.Diabetes mellitus due to underlying condition with diabetic nephropathy, with long-term current use of insulin Ooc, but looks like too much metformin given reduced weight;  Instructed pt to reduce metformin to 500 mg daily for now - POCT A1C 10 - Glucose (CBG) 50, snack given - Microalbumin/Creatinine Ratio, Urine - Ambulatory referral to Ophthalmology - glucometer/supplies ordered - ada diet instructions today.  2. htn - controlled, on lisinopril 10 daily, and norvasc 10 daily. - bmp today to chk renal function  3. Other specified nutritional anemias, ?2nd to etoh abuse - Iron, TIBC and Ferritin Panel - Ambulatory referral to Gastroenterology for screening colonoscopy - fobt Neg today.  4. ETOH abuse - recd etoh cessation - CMP and Liver  5. AKI (acute kidney injury) ([facility]) On labs 4/20, on lisinopril 10, norvasc 10 currently.  Will rechk. -  CMP and Liver - Creatinine with Est GFR  6. Special screening for malignant neoplasms, colon - Ambulatory referral to Gastroenterology - cscope  7. Abnormal PSA, ho per pt, was suppose to see Uro at 1 time - PSA   Return in about 2 weeks (around 11/15/2015).  The patient was given clear instructions to go to ER or return to medical center if symptoms don't improve, worsen or new problems develop. The patient verbalized understanding. The patient was told to call to get lab results if they haven't heard anything in the next week.      Maren Reamer, MD, [place], [place]   11/01/2015, 10:13 AM

## 2015-11-02 ENCOUNTER — Other Ambulatory Visit: Payer: Self-pay | Admitting: Internal Medicine

## 2015-11-02 DIAGNOSIS — R972 Elevated prostate specific antigen [PSA]: Secondary | ICD-10-CM

## 2015-11-02 LAB — PSA: PSA: 61.98 ng/mL — ABNORMAL HIGH (ref <=4.00)

## 2015-11-05 DIAGNOSIS — R972 Elevated prostate specific antigen [PSA]: Secondary | ICD-10-CM | POA: Diagnosis not present

## 2015-11-09 ENCOUNTER — Telehealth: Payer: Self-pay | Admitting: *Deleted

## 2015-11-09 NOTE — Telephone Encounter (Signed)
-----   Message from Maren Reamer, MD sent at 11/02/2015  9:17 AM EDT ----- Please call pt w/ results. His PSA was very abnormal /high, i put in referral for Urology for him.. Please tell him to keep this appt when get (he apparently missed last years? Appt) due to concerns for prostate CA.  His kidney function is improving from 5 days ago as well.  Still not normal, but better.  Continue low salt/renal diet (low phosphorous and protein).   Thanks.

## 2015-11-09 NOTE — Telephone Encounter (Signed)
Medical Assistant left message on patient's home and cell voicemail. Voicemail states to give a call back to Maveryk Renstrom with [facility] at [phone].  

## 2015-11-10 ENCOUNTER — Other Ambulatory Visit: Payer: Self-pay

## 2015-11-10 NOTE — Patient Outreach (Signed)
[facility]) Care Management  11/10/2015  Robert Sparks 1946/06/26 [id number]   Brief Home Visit for community care coordination. Patient identified himself by providing date of birth and address. Patient was agreeable to this initial assessment for community care needs.  Patient and this RNCM agreed to meet later this month for additional community care coordination, disease education. Patient states he would like to find out more about diabetes.

## 2015-11-11 DIAGNOSIS — H25813 Combined forms of age-related cataract, bilateral: Secondary | ICD-10-CM | POA: Diagnosis not present

## 2015-11-11 DIAGNOSIS — E119 Type 2 diabetes mellitus without complications: Secondary | ICD-10-CM | POA: Diagnosis not present

## 2015-11-11 LAB — HM DIABETES EYE EXAM

## 2015-11-24 ENCOUNTER — Other Ambulatory Visit: Payer: Self-pay

## 2015-11-24 NOTE — Patient Outreach (Signed)
[facility]) Care Management  11/24/2015  Alhaji Mcneal Arras 1945-12-22 [id number]    This RNCM and patient met in this home for community care coordination.  Patient and this RNCM collaborated to update his care plan.  Findings: HgbA1c is 10 Ran out of strips ([employer])-email sent to Arneta Cliche,  [facility] to request strips for patient. Patient has appointment at [facility] on Monday, May 22. Patient informed this RNCM his monthly income is $719.00. Patient states he had an eye examination recently, has not been able to buy his eyeglasses. RNCM advised pateitn he may be eligible for Medicaid, however, he  Must submit an application to be considered.   .Plan: Assist patient in filing out medicaid application. Home visit later this month for diabetic education

## 2015-11-29 ENCOUNTER — Encounter: Payer: Self-pay | Admitting: Internal Medicine

## 2015-11-29 ENCOUNTER — Ambulatory Visit: Payer: Medicare Other | Attending: Internal Medicine | Admitting: Internal Medicine

## 2015-11-29 DIAGNOSIS — Z794 Long term (current) use of insulin: Secondary | ICD-10-CM | POA: Diagnosis not present

## 2015-11-29 DIAGNOSIS — I10 Essential (primary) hypertension: Secondary | ICD-10-CM | POA: Diagnosis not present

## 2015-11-29 DIAGNOSIS — N183 Chronic kidney disease, stage 3 (moderate): Secondary | ICD-10-CM

## 2015-11-29 DIAGNOSIS — M21611 Bunion of right foot: Secondary | ICD-10-CM | POA: Diagnosis not present

## 2015-11-29 DIAGNOSIS — E1165 Type 2 diabetes mellitus with hyperglycemia: Secondary | ICD-10-CM

## 2015-11-29 DIAGNOSIS — E1122 Type 2 diabetes mellitus with diabetic chronic kidney disease: Secondary | ICD-10-CM

## 2015-11-29 LAB — GLUCOSE, POCT (MANUAL RESULT ENTRY): POC GLUCOSE: 111 mg/dL — AB (ref 70–99)

## 2015-11-29 MED ORDER — LISINOPRIL-HYDROCHLOROTHIAZIDE 10-12.5 MG PO TABS: 1.0000 | ORAL_TABLET | Freq: Every day | ORAL | Status: DC

## 2015-11-29 MED ORDER — GLUCOSE BLOOD VI STRP: ORAL_STRIP | Status: DC

## 2015-11-29 MED ORDER — AMLODIPINE BESYLATE 10 MG PO TABS: 10.0000 mg | ORAL_TABLET | Freq: Every day | ORAL | Status: DC

## 2015-11-29 MED ORDER — METFORMIN HCL 500 MG PO TABS: 500.0000 mg | ORAL_TABLET | Freq: Every day | ORAL | Status: DC

## 2015-11-29 MED FILL — ULTICARE PEN NDL 4MM 32G: 32G X 4 MM | 50 days supply | Qty: 100 | Fill #0

## 2015-11-29 MED FILL — !NOVOLOG 70/30 FLEXPEN: 70-30 | 30 days supply | Qty: 6 | Fill #0

## 2015-11-29 MED FILL — AMLODIPINE BESYLATE 10 MG T: 10 | 30 days supply | Qty: 30 | Fill #0

## 2015-11-29 MED FILL — metFORMIN HCL 500 MG TABS: 500 | 60 days supply | Qty: 60 | Fill #0

## 2015-11-29 MED FILL — LISINOPRIL-HCTZ 10-12.5 MG: 10-12.5 | 30 days supply | Qty: 30 | Fill #0

## 2015-11-29 MED FILL — TRUE METRIX TEST STRIP: 25 days supply | Qty: 100 | Fill #0

## 2015-11-29 NOTE — Patient Instructions (Addendum)
Please make patient Podiatry clinic here.  Low-Sodium Eating Plan Sodium raises blood pressure and causes water to be held in the body. Getting less sodium from food will help lower your blood pressure, reduce any swelling, and protect your heart, liver, and kidneys. We get sodium by adding salt (sodium chloride) to food. Most of our sodium comes from canned, boxed, and frozen foods. Restaurant foods, fast foods, and pizza are also very high in sodium. Even if you take medicine to lower your blood pressure or to reduce fluid in your body, getting less sodium from your food is important. WHAT IS MY PLAN? Most people should limit their sodium intake to 2,300 mg a day. Your health care provider recommends that you limit your sodium intake to __________ a day.  WHAT DO I NEED TO KNOW ABOUT THIS EATING PLAN? For the low-sodium eating plan, you will follow these general guidelines:  Choose foods with a % Daily Value for sodium of less than 5% (as listed on the food label).   Use salt-free seasonings or herbs instead of table salt or sea salt.   Check with your health care provider or pharmacist before using salt substitutes.   Eat fresh foods.  Eat more vegetables and fruits.  Limit canned vegetables. If you do use them, rinse them well to decrease the sodium.   Limit cheese to 1 oz (28 g) per day.   Eat lower-sodium products, often labeled as "lower sodium" or "no salt added."  Avoid foods that contain monosodium glutamate (MSG). MSG is sometimes added to [place] food and some canned foods.  Check food labels (Nutrition Facts labels) on foods to learn how much sodium is in one serving.  Eat more home-cooked food and less restaurant, buffet, and fast food.  When eating at a restaurant, ask that your food be prepared with less salt, or no salt if possible.  HOW DO I READ FOOD LABELS FOR SODIUM INFORMATION? The Nutrition Facts label lists the amount of sodium in one serving of the  food. If you eat more than one serving, you must multiply the listed amount of sodium by the number of servings. Food labels may also identify foods as:  Sodium free--Less than 5 mg in a serving.  Very low sodium--35 mg or less in a serving.  Low sodium--140 mg or less in a serving.  Light in sodium--50% less sodium in a serving. For example, if a food that usually has 300 mg of sodium is changed to become light in sodium, it will have 150 mg of sodium.  Reduced sodium--25% less sodium in a serving. For example, if a food that usually has 400 mg of sodium is changed to reduced sodium, it will have 300 mg of sodium. WHAT FOODS CAN I EAT? Grains Low-sodium cereals, including oats, puffed wheat and rice, and shredded wheat cereals. Low-sodium crackers. Unsalted rice and pasta. Lower-sodium bread.  Vegetables Frozen or fresh vegetables. Low-sodium or reduced-sodium canned vegetables. Low-sodium or reduced-sodium tomato sauce and paste. Low-sodium or reduced-sodium tomato and vegetable juices.  Fruits Fresh, frozen, and canned fruit. Fruit juice.  Meat and Other Protein Products Low-sodium canned tuna and salmon. Fresh or frozen meat, poultry, seafood, and fish. Lamb. Unsalted nuts. Dried beans, peas, and lentils without added salt. Unsalted canned beans. Homemade soups without salt. Eggs.  Dairy Milk. Soy milk. Ricotta cheese. Low-sodium or reduced-sodium cheeses. Yogurt.  Condiments Fresh and dried herbs and spices. Salt-free seasonings. Onion and garlic powders. Low-sodium varieties of mustard  and ketchup. Fresh or refrigerated horseradish. Lemon juice.  Fats and Oils Reduced-sodium salad dressings. Unsalted butter.  Other Unsalted popcorn and pretzels.  The items listed above may not be a complete list of recommended foods or beverages. Contact your dietitian for more options. WHAT FOODS ARE NOT RECOMMENDED? Grains Instant hot cereals. Bread stuffing, pancake, and biscuit  mixes. Croutons. Seasoned rice or pasta mixes. Noodle soup cups. Boxed or frozen macaroni and cheese. Self-rising flour. Regular salted crackers. Vegetables Regular canned vegetables. Regular canned tomato sauce and paste. Regular tomato and vegetable juices. Frozen vegetables in sauces. Salted [place] fries. Olives. Angie Fava. Relishes. Sauerkraut. Salsa. Meat and Other Protein Products Salted, canned, smoked, spiced, or pickled meats, seafood, or fish. Bacon, ham, sausage, hot dogs, corned beef, chipped beef, and packaged luncheon meats. Salt pork. Jerky. Pickled herring. Anchovies, regular canned tuna, and sardines. Salted nuts. Dairy Processed cheese and cheese spreads. Cheese curds. Blue cheese and cottage cheese. Buttermilk.  Condiments Onion and garlic salt, seasoned salt, table salt, and sea salt. Canned and packaged gravies. Worcestershire sauce. Tartar sauce. Barbecue sauce. Teriyaki sauce. Soy sauce, including reduced sodium. Steak sauce. Fish sauce. Oyster sauce. Cocktail sauce. Horseradish that you find on the shelf. Regular ketchup and mustard. Meat flavorings and tenderizers. Bouillon cubes. Hot sauce. Tabasco sauce. Marinades. Taco seasonings. Relishes. Fats and Oils Regular salad dressings. Salted butter. Margarine. Ghee. Bacon fat.  Other Potato and tortilla chips. Corn chips and puffs. Salted popcorn and pretzels. Canned or dried soups. Pizza. Frozen entrees and pot pies.  The items listed above may not be a complete list of foods and beverages to avoid. Contact your dietitian for more information.   This information is not intended to replace advice given to you by your health care provider. Make sure you discuss any questions you have with your health care provider.   Document Released: 12/16/2001 Document Revised: 07/17/2014 Document Reviewed: 04/30/2013 Elsevier Interactive Patient Education 2016 Else[employer] Inc.  - Diabetes Mellitus and Food It is important for you to  manage your blood sugar (glucose) level. Your blood glucose level can be greatly affected by what you eat. Eating healthier foods in the appropriate amounts throughout the day at about the same time each day will help you control your blood glucose level. It can also help slow or prevent worsening of your diabetes mellitus. Healthy eating may even help you improve the level of your blood pressure and reach or maintain a healthy weight.  General recommendations for healthful eating and cooking habits include:  Eating meals and snacks regularly. Avoid going long periods of time without eating to lose weight.  Eating a diet that consists mainly of plant-based foods, such as fruits, vegetables, nuts, legumes, and whole grains.  Using low-heat cooking methods, such as baking, instead of high-heat cooking methods, such as deep frying. Work with your dietitian to make sure you understand how to use the Nutrition Facts information on food labels. HOW CAN FOOD AFFECT ME? Carbohydrates Carbohydrates affect your blood glucose level more than any other type of food. Your dietitian will help you determine how many carbohydrates to eat at each meal and teach you how to count carbohydrates. Counting carbohydrates is important to keep your blood glucose at a healthy level, especially if you are using insulin or taking certain medicines for diabetes mellitus. Alcohol Alcohol can cause sudden decreases in blood glucose (hypoglycemia), especially if you use insulin or take certain medicines for diabetes mellitus. Hypoglycemia can be a life-threatening condition. Symptoms of  hypoglycemia (sleepiness, dizziness, and disorientation) are similar to symptoms of having too much alcohol.  If your health care provider has given you approval to drink alcohol, do so in moderation and use the following guidelines:  Women should not have more than one drink per day, and men should not have more than two drinks per day. One drink  is equal to:  12 oz of beer.  5 oz of wine.  1 oz of hard liquor.  Do not drink on an empty stomach.  Keep yourself hydrated. Have water, diet soda, or unsweetened iced tea.  Regular soda, juice, and other mixers might contain a lot of carbohydrates and should be counted. WHAT FOODS ARE NOT RECOMMENDED? As you make food choices, it is important to remember that all foods are not the same. Some foods have fewer nutrients per serving than other foods, even though they might have the same number of calories or carbohydrates. It is difficult to get your body what it needs when you eat foods with fewer nutrients. Examples of foods that you should avoid that are high in calories and carbohydrates but low in nutrients include:  Trans fats (most processed foods list trans fats on the Nutrition Facts label).  Regular soda.  Juice.  Candy.  Sweets, such as cake, pie, doughnuts, and cookies.  Fried foods. WHAT FOODS CAN I EAT? Eat nutrient-rich foods, which will nourish your body and keep you healthy. The food you should eat also will depend on several factors, including:  The calories you need.  The medicines you take.  Your weight.  Your blood glucose level.  Your blood pressure level.  Your cholesterol level. You should eat a variety of foods, including:  Protein.  Lean cuts of meat.  Proteins low in saturated fats, such as fish, egg whites, and beans. Avoid processed meats.  Fruits and vegetables.  Fruits and vegetables that may help control blood glucose levels, such as apples, mangoes, and yams.  Dairy products.  Choose fat-free or low-fat dairy products, such as milk, yogurt, and cheese.  Grains, bread, pasta, and rice.  Choose whole grain products, such as multigrain bread, whole oats, and brown rice. These foods may help control blood pressure.  Fats.  Foods containing healthful fats, such as nuts, avocado, olive oil, canola oil, and fish. DOES EVERYONE  WITH DIABETES MELLITUS HAVE THE SAME MEAL PLAN? Because every person with diabetes mellitus is different, there is not one meal plan that works for everyone. It is very important that you meet with a dietitian who will help you create a meal plan that is just right for you.   This information is not intended to replace advice given to you by your health care provider. Make sure you discuss any questions you have with your health care provider.   Document Released: 03/23/2005 Document Revised: 07/17/2014 Document Reviewed: 05/23/2013 Elsevier Interactive Patient Education 2016 [employer].  - Diabetes and Exercise Exercising regularly is important. It is not just about losing weight. It has many health benefits, such as:  Improving your overall fitness, flexibility, and endurance.  Increasing your bone density.  Helping with weight control.  Decreasing your body fat.  Increasing your muscle strength.  Reducing stress and tension.  Improving your overall health. People with diabetes who exercise gain additional benefits because exercise:  Reduces appetite.  Improves the body's use of blood sugar (glucose).  Helps lower or control blood glucose.  Decreases blood pressure.  Helps control blood lipids (such  as cholesterol and triglycerides).  Improves the body's use of the hormone insulin by:  Increasing the body's insulin sensitivity.  Reducing the body's insulin needs.  Decreases the risk for heart disease because exercising:  Lowers cholesterol and triglycerides levels.  Increases the levels of good cholesterol (such as high-density lipoproteins [HDL]) in the body.  Lowers blood glucose levels. YOUR ACTIVITY PLAN  Choose an activity that you enjoy, and set realistic goals. To exercise safely, you should begin practicing any new physical activity slowly, and gradually increase the intensity of the exercise over time. Your health care provider or diabetes educator can  help create an activity plan that works for you. General recommendations include:  Encouraging children to engage in at least 60 minutes of physical activity each day.  Stretching and performing strength training exercises, such as yoga or weight lifting, at least 2 times per week.  Performing a total of at least 150 minutes of moderate-intensity exercise each week, such as brisk walking or water aerobics.  Exercising at least 3 days per week, making sure you allow no more than 2 consecutive days to pass without exercising.  Avoiding long periods of inactivity (90 minutes or more). When you have to spend an extended period of time sitting down, take frequent breaks to walk or stretch. RECOMMENDATIONS FOR EXERCISING WITH TYPE 1 OR TYPE 2 DIABETES   Check your blood glucose before exercising. If blood glucose levels are greater than 240 mg/dL, check for urine ketones. Do not exercise if ketones are present.  Avoid injecting insulin into areas of the body that are going to be exercised. For example, avoid injecting insulin into:  The arms when playing tennis.  The legs when jogging.  Keep a record of:  Food intake before and after you exercise.  Expected peak times of insulin action.  Blood glucose levels before and after you exercise.  The type and amount of exercise you have done.  Review your records with your health care provider. Your health care provider will help you to develop guidelines for adjusting food intake and insulin amounts before and after exercising.  If you take insulin or oral hypoglycemic agents, watch for signs and symptoms of hypoglycemia. They include:  Dizziness.  Shaking.  Sweating.  Chills.  Confusion.  Drink plenty of water while you exercise to prevent dehydration or heat stroke. Body water is lost during exercise and must be replaced.  Talk to your health care provider before starting an exercise program to make sure it is safe for you.  Remember, almost any type of activity is better than none.   This information is not intended to replace advice given to you by your health care provider. Make sure you discuss any questions you have with your health care provider.   Document Released: 09/16/2003 Document Revised: 11/10/2014 Document Reviewed: 12/03/2012 Elsevier Interactive Patient Education [employer].

## 2015-11-29 NOTE — Progress Notes (Signed)
Robert Sparks, is a 70 y.o. male  YPP:[id number]  WPY:[id number]  DOB - 1946/02/28  Chief Complaint  Patient presents with  . Follow-up    DM        Subjective:   Robert Sparks is a 70 y.o. male w/ DM2, htn, abnormal PSA, here today for a follow up visit of DM.  He says he has  Been doing well w/ metformin 562m qday w/ novolog 70/30 mix 10units bid.  He states he has had much fewer hypoglycemic events and feeling better overall.  Still admits to drinking occasionally and smoking 2-3 cigarettes/daily.  He knows he should stop both, but it is difficult.   Patient has No headache, No chest pain, No abdominal pain - No Nausea, No new weakness tingling or numbness, No Cough - SOB.  No problems updated.  ALLERGIES: No Known Allergies  PAST MEDICAL HISTORY: Past Medical History  Diagnosis Date  . ETOH abuse   . GERD (gastroesophageal reflux disease)   . Type II or unspecified type diabetes mellitus without mention of complication, not stated as uncontrolled 07/29/2013  . Hypertension     MEDICATIONS AT HOME: Prior to Admission medications   Medication Sig Start Date End Date Taking? Authorizing Provider  amLODipine (NORVASC) 10 MG tablet Take 1 tablet (10 mg total) by mouth daily. 11/29/15  Yes DMaren Reamer MD  Blood Glucose Monitoring Suppl (TRUE METRIX GO GLUCOSE METER) w/Device KIT 1 each by Does not apply route every 8 (eight) hours as needed. 11/01/15  Yes DMaren Reamer MD  gabapentin (NEURONTIN) 300 MG capsule Take 1 capsule (300 mg total) by mouth 2 (two) times daily. 11/01/15  Yes DMaren Reamer MD  glucose blood (TRUE METRIX BLOOD GLUCOSE TEST) test strip Use as instructed 11/29/15  Yes DMaren Reamer MD  insulin aspart protamine - aspart (NOVOLOG 70/30 MIX) (70-30) 100 UNIT/ML FlexPen Please take 10units before breakfast and 10 units before supper everyday. 10/28/15  Yes DVeryl Speak MD  Insulin Pen Needle (PEN NEEDLES 3/16") 31G X 5 MM MISC Use one  needle twice a day as directed. 10/28/15  Yes DVeryl Speak MD  metFORMIN (GLUCOPHAGE) 500 MG tablet Take 1 tablet (500 mg total) by mouth daily with breakfast. 11/29/15  Yes DMaren Reamer MD  TRUEPLUS LANCETS 26G MISC 1 each by Does not apply route every 8 (eight) hours as needed. 11/01/15  Yes DMaren Reamer MD  lisinopril-hydrochlorothiazide (PRINZIDE,ZESTORETIC) 10-12.5 MG tablet Take 1 tablet by mouth daily. 11/29/15   DMaren Reamer MD  Multiple Vitamin (MULTIVITAMIN WITH MINERALS) TABS tablet Take 1 tablet by mouth daily. Patient not taking: Reported on 11/29/2015 07/29/13   DEugenie Filler MD  nicotine (NICODERM CQ - DOSED IN MG/24 HOURS) 14 mg/24hr patch Place 1 patch (14 mg total) onto the skin daily. Patient not taking: Reported on 11/29/2015 04/01/15   KSmiley Houseman MD  thiamine 100 MG tablet Take 1 tablet (100 mg total) by mouth daily. Patient not taking: Reported on 11/29/2015 07/29/13   DEugenie Filler MD     Objective:   Filed Vitals:   11/29/15 1006  BP: 164/91  Pulse: 82  Temp: 98 F (36.7 C)  TempSrc: Oral  Resp: 16  Height: 5' 6.5" (1.689 m)  Weight: 162 lb 12.8 oz (73.846 kg)  SpO2: 99%    Exam General appearance : Awake, alert, not in any distress. Speech Clear. Not toxic looking, pleasant. HEENT: Atraumatic and Normocephalic, pupils equally  reactive to light. Neck: supple, no JVD. No cervical lymphadenopathy.  Chest:Good air entry bilaterally, no added sounds. CVS: S1 S2 regular, no murmurs/gallups or rubs. Abdomen: Bowel sounds active, Non tender.    Foot exam: bilateral peripheral pulses 2+ (dorsalis pedis and post tibialis pulses), no ulcers noted/no ecchymosis, warm to touch, monofilament testing 3/3 bilat. Sensation intact.  No c/c/e.  Small callus under the MCP of right great toe.  Neurology: Awake alert, and oriented X 3, CN II-XII grossly intact, Non focal Skin:No Rash  Data Review Lab Results  Component Value Date   HGBA1C 10.0  11/01/2015   HGBA1C 10.5* 03/30/2015   HGBA1C 5.9* 07/27/2013    Depression screen PHQ 2/9 11/29/2015 11/10/2015 11/01/2015  Decreased Interest 0 0 0  Down, Depressed, Hopeless 0 0 0  PHQ - 2 Score 0 0 0  Altered sleeping - - 2  Tired, decreased energy - - 3  Change in appetite - - 0  Feeling bad or failure about yourself  - - 0  Trouble concentrating - - 0  Moving slowly or fidgety/restless - - 0  Suicidal thoughts - - 0  PHQ-9 Score - - 5      Assessment & Plan   1. Type 2 diabetes mellitus with hyperglycemia, with long-term current use of insulin (HCC) - Glucose (CBG) 111 - Ambulatory referral to Podiatry - keep same, novolog 70/30 10 units bid, metformin 500 qday - chk a1c next visit  2. Essential hypertension, uncontrolled -Not at goal, change lisinopril to prinzide 10-12.5 qday -Continue norvasc 10 qd  3. CKD stage 3 due to type 2 diabetes mellitus ([place]) -watch, currently on ACEI.  4. Bunion of great toe of right foot - Ambulatory referral to Podiatry   5. abnml psa - has urology f/u this Weds for bx.   Patient have been counseled extensively about nutrition and exercise  Return in about 2 months (around 01/29/2016) for htn/dm ch.  The patient was given clear instructions to go to ER or return to medical center if symptoms don't improve, worsen or new problems develop. The patient verbalized understanding. The patient was told to call to get lab results if they haven't heard anything in the next week.    Maren Reamer, MD, [facility] [place], [place]   11/29/2015, 2:13 PM

## 2015-11-29 NOTE — Progress Notes (Signed)
Pt here for 2 wk F/U for DM. Pt denies any pain today. Pt CBG is 111. Pt needs a refill on amlodipine, lisinopril, test strips, and novolog.

## 2015-12-03 ENCOUNTER — Other Ambulatory Visit: Payer: Self-pay | Admitting: Urology

## 2015-12-09 ENCOUNTER — Ambulatory Visit: Payer: Self-pay

## 2015-12-14 ENCOUNTER — Other Ambulatory Visit: Payer: Self-pay | Admitting: Urology

## 2015-12-14 DIAGNOSIS — R972 Elevated prostate specific antigen [PSA]: Secondary | ICD-10-CM

## 2015-12-15 ENCOUNTER — Encounter (HOSPITAL_BASED_OUTPATIENT_CLINIC_OR_DEPARTMENT_OTHER): Payer: Self-pay | Admitting: *Deleted

## 2015-12-15 NOTE — Progress Notes (Signed)
NPO AFTER MN.  ARRIVE AT 0730.  NEEDS ISTAT 8 .  CURRENT EKG IN CHART AND EPIC.  WILL TAKE GABAPENTIN AND NORVASC AM DOS W/ SIPS OF WATER.

## 2015-12-20 NOTE — H&P (Signed)
History of Present Illness F/u - PCP Dr. Janne Napoleon / Dr. Marylen Ponto. Patient underwent a PSA test November 01, 2015 with the results of 61.98. He was actually referred in 2015 by Dr. Edward Jolly- Bonsu for elevated PSA but I do not have that value. He has no family history of prostate cancer. Past medical history is significant for diabetes and chronic renal insufficiency with a creatinine ranging from 1.66 - 2.04 over the past few years. GFR 41-48. Renal ultrasound in 2015 showed no hydronephrosis. I reviewed the images. No bladder images were done. He has some occasional weak stream but typically voids with a good stream. Today, he presents for prostate biopsy. He's been well without dysuria. Procedure Patient with very tight sphincter, especially the internal sphincter. I tried 2 finger dilation but despite this could not get the probe inserted. He had considerable pain.  Procedure:  Local Anesthesia:  Antibiotic prophylaxis:  Procedure Note: The prostate was indurated (right mid ).  Assessment Assessed  1. Elevated PSA (R97.20) Plan Elevated PSA  1. Stop: LevoFLOXacin 500 MG Oral Tablet (Levaquin) 2. Administered: CefTRIAXone Sodium 1 GM Injection Solution Reconstituted 3. Follow-up Schedule Surgery Office Follow-up Status: Hold For - Appointment  Requested for: [id number] 4. BIOPSY; Status:Canceled - Specimen/Data Collection;  5. PROSTATE BIOPSY ULTRASOUND; Status:Canceled;  Discussion/Summary Discussed with patient again the nature risks benefits and alternatives to prostate biopsy this time under anesthesia and elected to proceed. This will be arranged. Right prostate felt indurated. cc: Dr. Janne Napoleon  Signatures Electronically signed by : Festus Aloe, M.D.; Dec 01 2015 1:55PM EST

## 2015-12-21 ENCOUNTER — Ambulatory Visit (HOSPITAL_COMMUNITY)
Admission: RE | Admit: 2015-12-21 | Discharge: 2015-12-21 | Disposition: A | Discharge status: Home / Self Care | Payer: Medicare Other | Source: Ambulatory Visit | Attending: Urology | Admitting: Urology

## 2015-12-21 ENCOUNTER — Ambulatory Visit (HOSPITAL_BASED_OUTPATIENT_CLINIC_OR_DEPARTMENT_OTHER)
Admission: RE | Admit: 2015-12-21 | Discharge: 2015-12-21 | Disposition: A | Discharge status: Home / Self Care | Payer: Medicare Other | Source: Ambulatory Visit | Attending: Urology | Admitting: Urology

## 2015-12-21 ENCOUNTER — Ambulatory Visit (HOSPITAL_BASED_OUTPATIENT_CLINIC_OR_DEPARTMENT_OTHER): Payer: Medicare Other | Admitting: Anesthesiology

## 2015-12-21 ENCOUNTER — Encounter (HOSPITAL_BASED_OUTPATIENT_CLINIC_OR_DEPARTMENT_OTHER): Payer: Self-pay | Admitting: *Deleted

## 2015-12-21 ENCOUNTER — Encounter (HOSPITAL_BASED_OUTPATIENT_CLINIC_OR_DEPARTMENT_OTHER)
Admission: RE | Disposition: A | Discharge status: Home / Self Care | Payer: Self-pay | Source: Ambulatory Visit | Attending: Urology

## 2015-12-21 DIAGNOSIS — I129 Hypertensive chronic kidney disease with stage 1 through stage 4 chronic kidney disease, or unspecified chronic kidney disease: Secondary | ICD-10-CM | POA: Diagnosis not present

## 2015-12-21 DIAGNOSIS — N189 Chronic kidney disease, unspecified: Secondary | ICD-10-CM | POA: Diagnosis not present

## 2015-12-21 DIAGNOSIS — C61 Malignant neoplasm of prostate: Secondary | ICD-10-CM | POA: Insufficient documentation

## 2015-12-21 DIAGNOSIS — R972 Elevated prostate specific antigen [PSA]: Secondary | ICD-10-CM

## 2015-12-21 HISTORY — DX: Type 2 diabetes mellitus without complications: E11.9

## 2015-12-21 HISTORY — DX: Chronic kidney disease, stage 3 unspecified: N18.30

## 2015-12-21 HISTORY — PX: PROSTATE BIOPSY: SHX241

## 2015-12-21 HISTORY — DX: Nocturia: R35.1

## 2015-12-21 HISTORY — DX: Unspecified osteoarthritis, unspecified site: M19.90

## 2015-12-21 HISTORY — DX: Elevated prostate specific antigen (PSA): R97.20

## 2015-12-21 HISTORY — DX: Personal history of other infectious and parasitic diseases: Z86.19

## 2015-12-21 HISTORY — DX: Type 2 diabetes mellitus without complications: Z79.4

## 2015-12-21 HISTORY — DX: Polyneuropathy, unspecified: G62.9

## 2015-12-21 HISTORY — DX: Chronic kidney disease, stage 3 (moderate): N18.3

## 2015-12-21 HISTORY — DX: Personal history of other endocrine, nutritional and metabolic disease: Z86.39

## 2015-12-21 LAB — POCT I-STAT, CHEM 8
BUN: 15 mg/dL (ref 6–20)
CALCIUM ION: 1.29 mmol/L (ref 1.13–1.30)
CREATININE: 1.8 mg/dL — AB (ref 0.61–1.24)
Chloride: 93 mmol/L — ABNORMAL LOW (ref 101–111)
GLUCOSE: 143 mg/dL — AB (ref 65–99)
HCT: 44 % (ref 39.0–52.0)
HEMOGLOBIN: 15 g/dL (ref 13.0–17.0)
Potassium: 4.6 mmol/L (ref 3.5–5.1)
Sodium: 129 mmol/L — ABNORMAL LOW (ref 135–145)
TCO2: 23 mmol/L (ref 0–100)

## 2015-12-21 SURGERY — BIOPSY, PROSTATE, RECTAL APPROACH, WITH US GUIDANCE
Anesthesia: Monitor Anesthesia Care

## 2015-12-21 MED ORDER — ONDANSETRON HCL 4 MG/2ML IJ SOLN
INTRAMUSCULAR | Status: DC | PRN
  Administered 2015-12-21: 4 mg via INTRAVENOUS

## 2015-12-21 MED ORDER — PROPOFOL 10 MG/ML IV BOLUS
INTRAVENOUS | Status: DC | PRN
  Administered 2015-12-21 (×2): 25 mg via INTRAVENOUS

## 2015-12-21 MED ORDER — EPHEDRINE SULFATE 50 MG/ML IJ SOLN
INTRAMUSCULAR | Status: AC
  Filled 2015-12-21: qty 2

## 2015-12-21 MED ORDER — FENTANYL CITRATE (PF) 100 MCG/2ML IJ SOLN
INTRAMUSCULAR | Status: DC | PRN
Start: 2015-12-21 — End: 2015-12-21
  Administered 2015-12-21 (×2): 25 ug via INTRAVENOUS
  Administered 2015-12-21: 50 ug via INTRAVENOUS

## 2015-12-21 MED ORDER — LIDOCAINE HCL 2 % IJ SOLN
INTRAMUSCULAR | Status: DC | PRN
  Administered 2015-12-21: 10 mL

## 2015-12-21 MED ORDER — FENTANYL CITRATE (PF) 100 MCG/2ML IJ SOLN
INTRAMUSCULAR | Status: AC
  Filled 2015-12-21: qty 2

## 2015-12-21 MED ORDER — STERILE WATER FOR INJECTION IJ SOLN
INTRAMUSCULAR | Status: AC
  Filled 2015-12-21: qty 10

## 2015-12-21 MED ORDER — PROPOFOL 500 MG/50ML IV EMUL
INTRAVENOUS | Status: DC | PRN
  Administered 2015-12-21: 200 ug/kg/min via INTRAVENOUS

## 2015-12-21 MED ORDER — MIDAZOLAM HCL 2 MG/2ML IJ SOLN
INTRAMUSCULAR | Status: AC
  Filled 2015-12-21: qty 2

## 2015-12-21 MED ORDER — SODIUM CHLORIDE 0.9 % IV SOLN
INTRAVENOUS | Status: DC
  Administered 2015-12-21: 08:00:00 via INTRAVENOUS
  Filled 2015-12-21: qty 1000

## 2015-12-21 MED ORDER — CLINDAMYCIN PHOSPHATE 900 MG/50ML IV SOLN
900.0000 mg | INTRAVENOUS | Status: AC
  Administered 2015-12-21: 900 mg via INTRAVENOUS
  Filled 2015-12-21: qty 50

## 2015-12-21 MED ORDER — GENTAMICIN SULFATE 40 MG/ML IJ SOLN
5.0000 mg/kg | INTRAVENOUS | Status: AC
  Administered 2015-12-21: 360 mg via INTRAVENOUS
  Filled 2015-12-21: qty 9

## 2015-12-21 MED ORDER — PHENYLEPHRINE HCL 10 MG/ML IJ SOLN
INTRAMUSCULAR | Status: DC | PRN
  Administered 2015-12-21 (×3): 120 ug via INTRAVENOUS

## 2015-12-21 MED ORDER — LIDOCAINE HCL (CARDIAC) 20 MG/ML IV SOLN
INTRAVENOUS | Status: DC | PRN
  Administered 2015-12-21: 50 mg via INTRAVENOUS

## 2015-12-21 MED ORDER — CLINDAMYCIN PHOSPHATE 900 MG/50ML IV SOLN
INTRAVENOUS | Status: AC
Start: 2015-12-21 — End: 2015-12-21
  Filled 2015-12-21: qty 50

## 2015-12-21 MED ORDER — ONDANSETRON HCL 4 MG/2ML IJ SOLN
INTRAMUSCULAR | Status: AC
  Filled 2015-12-21: qty 2

## 2015-12-21 MED ORDER — GENTAMICIN SULFATE 40 MG/ML IJ SOLN
5.0000 mg/kg | INTRAVENOUS | Status: DC
  Filled 2015-12-21: qty 9.25

## 2015-12-21 SURGICAL SUPPLY — 13 items
DRSG TELFA 3X8 NADH (GAUZE/BANDAGES/DRESSINGS) IMPLANT
GLOVE SURG SS PI 8.0 STRL IVOR (GLOVE) IMPLANT
INST BIOPSY MAXCORE 18GX25 (NEEDLE) ×3 IMPLANT
INSTR BIOPSY MAXCORE 18GX20 (NEEDLE) IMPLANT
KIT ROOM TURNOVER WOR (KITS) ×3 IMPLANT
NDL SAFETY ECLIPSE 18X1.5 (NEEDLE) ×1 IMPLANT
NDL SPNL 22GX7 QUINCKE BK (NEEDLE) IMPLANT
NEEDLE HYPO 18GX1.5 SHARP (NEEDLE) ×3
NEEDLE SPNL 22GX7 QUINCKE BK (NEEDLE) ×3 IMPLANT
SURGILUBE 2OZ TUBE FLIPTOP (MISCELLANEOUS) ×2 IMPLANT
SYR CONTROL 10ML LL (SYRINGE) ×3 IMPLANT
TOWEL OR 17X24 6PK STRL BLUE (TOWEL DISPOSABLE) IMPLANT
UNDERPAD 30X30 INCONTINENT (UNDERPADS AND DIAPERS) ×5 IMPLANT

## 2015-12-21 NOTE — Op Note (Signed)
Preoperative diagnosis: Elevated PSA Postoperative diagnosis: Same  Procedure: Transrectal ultrasound prostate biopsy  Surgeon: Junious Silk  Anesthesia: Mac  Indication for procedure 70 year old African-American male with elevated PSA. Did not tolerate office biopsy.  Findings: On exam under anesthesia there was a tight band at the sphincter. Prostate overall had no specific heart area or nodule but the right side felt more indurated than the left. All landmarks were preserved.  T2, 41 g prostate  On ultrasound there were no obvious hypoechoic lesions or SV invasion. The SCDs and the capsule appeared normal. Prostate measured 4.74 cm x 5.05 cm x 3.28 cm for a volume of 41 g.  Description of procedure: After consent was obtained patient brought the operating room. His placed in left lateral decubitus with his knees pulled to his chest. After adequate anesthesia the ultrasound probe was inserted without difficulty. Images of the seminal vesicles prostate were obtained. Prostate measurements were obtained. The ultrasound was then used to guide standard 12 core needle biopsy lateral to medial, base mid to apex. Right and left. Patient tolerated the procedure well. There was minimal bleeding. He was awakened and taken to the recovery room in stable condition.  Complications: None Blood loss: Minimal Drains: None  Specimens to pathology: 12 core biopsy individually labeled

## 2015-12-21 NOTE — Anesthesia Postprocedure Evaluation (Signed)
Anesthesia Post Note  Patient: Robert Sparks  Procedure(s) Performed: Procedure(s) (LRB): BIOPSY TRANSRECTAL ULTRASONIC PROSTATE (TUBP) (N/A)  Patient location during evaluation: PACU Anesthesia Type: MAC Level of consciousness: awake and alert Pain management: pain level controlled Vital Signs Assessment: post-procedure vital signs reviewed and stable Respiratory status: spontaneous breathing, nonlabored ventilation, respiratory function stable and patient connected to nasal cannula oxygen Cardiovascular status: stable and blood pressure returned to baseline Anesthetic complications: no    Last Vitals:  Filed Vitals:   12/21/15 1000 12/21/15 1039  BP: 113/74 119/70  Pulse: 88 87  Temp:  37 C  Resp: 16 16    Last Pain:  Filed Vitals:   12/21/15 1153  PainSc: 0-No pain                 Lyndle Pang,JAMES TERRILL

## 2015-12-21 NOTE — Interval H&P Note (Signed)
History and Physical Interval Note:  12/21/2015 7:32 AM  Robert Sparks  has presented today for surgery, with the diagnosis of ELEVATED PROSTATIC SPECIFIC ANTIGEN  The various methods of treatment have been discussed with the patient and family. After consideration of risks, benefits and other options for treatment, the patient has consented to  Procedure(s): BIOPSY TRANSRECTAL ULTRASONIC PROSTATE (TUBP) (N/A) as a surgical intervention .  The patient's history has been reviewed, patient examined, no change in status, stable for surgery.  I have reviewed the patient's chart and labs.  Questions were answered to the patient's satisfaction.     Zeva Leber

## 2015-12-21 NOTE — Anesthesia Preprocedure Evaluation (Addendum)
Anesthesia Evaluation  Patient identified by MRN, date of birth, ID band Patient awake    Reviewed: Allergy & Precautions, NPO status , Patient's Chart, lab work & pertinent test results  History of Anesthesia Complications (+) history of anesthetic complications  Airway Mallampati: I  TM Distance: >3 FB Neck ROM: Full    Dental  (+) Edentulous Upper, Dental Advisory Given   Pulmonary Current Smoker,    Pulmonary exam normal breath sounds clear to auscultation       Cardiovascular hypertension, Pt. on medications  Rhythm:Regular Rate:Normal  29-Mar-2015 EKG Normal sinus rhythm   Neuro/Psych  Neuromuscular disease    GI/Hepatic GERD  Medicated,(+)     substance abuse  alcohol use,   Endo/Other  diabetes  Renal/GU Renal InsufficiencyRenal disease     Musculoskeletal  (+) Arthritis ,   Abdominal   Peds  Hematology   Anesthesia Other Findings   Reproductive/Obstetrics                           Anesthesia Physical Anesthesia Plan  ASA: III  Anesthesia Plan: General   Post-op Pain Management:    Induction: Intravenous  Airway Management Planned: LMA  Additional Equipment:   Intra-op Plan:   Post-operative Plan: Extubation in OR  Informed Consent: I have reviewed the patients History and Physical, chart, labs and discussed the procedure including the risks, benefits and alternatives for the proposed anesthesia with the patient or authorized representative who has indicated his/her understanding and acceptance.   Dental advisory given  Plan Discussed with:   Anesthesia Plan Comments:         Anesthesia Quick Evaluation

## 2015-12-21 NOTE — Discharge Instructions (Signed)
Transrectal Ultrasound-Guided Biopsy A transrectal ultrasound-guided biopsy is a procedure to remove samples of tissue from your prostate using ultrasound images to guide the procedure. The procedure is usually done to evaluate the prostate gland of men who have an elevated prostate-specific antigen (PSA). PSA is a blood test to screen for prostate cancer. The biopsy samples are taken to check for prostate cancer.  PROCEDURE   Samples of prostate tissue were taken   The biopsy samples will be sent to a lab to be analyzed. Results are usually back in 2-3 days. AFTER THE PROCEDURE  You may have some discomfort in the rectal area.   You may be allowed to go home the same day  [place]  Things you can do to ease any discomfort after your procedure include:  Drinking enough water and fluids to keep your urine clear or pale yellow. SEEK IMMEDIATE MEDICAL CARE IF:   You have an increase in blood in your urine.  You notice blood clots in your urine.  You have difficulty passing urine.  You have the chills.  You have abdominal pain.  You have a fever or persistent symptoms for more than 2-3 days.  You have a fever and your symptoms suddenly get worse. MAKE SURE YOU:   Understand these instructions.  Will watch your condition.  Will get help right away if you are not doing well or get worse.     Post Anesthesia Home Care Instructions  Activity: Get plenty of rest for the remainder of the day. A responsible adult should stay with you for 24 hours following the procedure.  For the next 24 hours, DO NOT: -Drive a car -Paediatric nurse -Drink alcoholic beverages -Take any medication unless instructed by your physician -Make any legal decisions or sign important papers.  Meals: Start with liquid foods such as gelatin or soup. Progress to regular foods as tolerated. Avoid greasy, spicy, heavy foods. If nausea and/or vomiting occur, drink only clear liquids until  the nausea and/or vomiting subsides. Call your physician if vomiting continues.  Special Instructions/Symptoms: Your throat may feel dry or sore from the anesthesia or the breathing tube placed in your throat during surgery. If this causes discomfort, gargle with warm salt water. The discomfort should disappear within 24 hours.  If you had a scopolamine patch placed behind your ear for the management of post- operative nausea and/or vomiting:  1. The medication in the patch is effective for 72 hours, after which it should be removed.  Wrap patch in a tissue and discard in the trash. Wash hands thoroughly with soap and water. 2. You may remove the patch earlier than 72 hours if you experience unpleasant side effects which may include dry mouth, dizziness or visual disturbances. 3. Avoid touching the patch. Wash your hands with soap and water after contact with the patch.

## 2015-12-21 NOTE — Transfer of Care (Signed)
Immediate Anesthesia Transfer of Care Note  Patient: Robert Sparks  Procedure(s) Performed: Procedure(s): BIOPSY TRANSRECTAL ULTRASONIC PROSTATE (TUBP) (N/A)  Patient Location: PACU  Anesthesia Type:MAC  Level of Consciousness: awake, alert , oriented and patient cooperative  Airway & Oxygen Therapy: Patient Spontanous Breathing and Patient connected to nasal cannula oxygen  Post-op Assessment: Report given to RN and Post -op Vital signs reviewed and stable  Post vital signs: Reviewed and stable  Last Vitals:  Filed Vitals:   12/21/15 0709  BP: 132/74  Pulse: 87  Temp: 36.9 C  Resp: 18    Last Pain: There were no vitals filed for this visit.    Patients Stated Pain Goal: 8 ([id number])  Complications: No apparent anesthesia complications

## 2015-12-22 ENCOUNTER — Encounter (HOSPITAL_BASED_OUTPATIENT_CLINIC_OR_DEPARTMENT_OTHER): Payer: Self-pay | Admitting: Urology

## 2015-12-23 ENCOUNTER — Other Ambulatory Visit: Payer: Self-pay | Admitting: Urology

## 2015-12-23 ENCOUNTER — Other Ambulatory Visit: Payer: Self-pay

## 2015-12-23 DIAGNOSIS — C61 Malignant neoplasm of prostate: Secondary | ICD-10-CM

## 2015-12-23 NOTE — Patient Outreach (Signed)
[place] [facility]) Care Management  12/23/2015  Robert Sparks [birth year] [id number]      Met with patient at his home for this discharge visit. Patient reports decreasing carbohydrates in his diet by consuming only one piece of bread per day. Patient reports being able to afford his medication, reports compliance with administration. Patient is post biopsy of the prostate on yesterday. Patient denies pain, states his urine is a little pink.  Patient reports consuming increased amount of water to flush his kidneys.   Patient states he monitors his blood sugars at least once daily, he reports fasting glucose levels of 120's to 140's.   Was able to verbalize hypo//hyperglycemia symptoms and interventions for each.  Has not heard from [facility] , application sent in about 3 weeks ago  Encouraged to get telephone through low income program at [place] for use for improved communications.    Patient denies need for additional community care coordination. RNCM advised patient to call if further case management needs arise. Patient advised that he could call [facility]'s primary number (located in his [place]) and on this RNCM's business card presented to patient.  Patient also advised that any of his medical providers could also refer to [facility].  Plan: Discharge patient from caseload by sending discharge letter to patient and his primary care physican.

## 2015-12-29 ENCOUNTER — Ambulatory Visit (HOSPITAL_COMMUNITY)
Admission: RE | Admit: 2015-12-29 | Discharge: 2015-12-29 | Disposition: A | Discharge status: Home / Self Care | Payer: Medicare Other | Source: Ambulatory Visit | Attending: Urology | Admitting: Urology

## 2015-12-29 DIAGNOSIS — C61 Malignant neoplasm of prostate: Secondary | ICD-10-CM | POA: Diagnosis not present

## 2015-12-29 DIAGNOSIS — R972 Elevated prostate specific antigen [PSA]: Secondary | ICD-10-CM | POA: Diagnosis not present

## 2015-12-29 DIAGNOSIS — R350 Frequency of micturition: Secondary | ICD-10-CM | POA: Diagnosis not present

## 2015-12-29 MED ORDER — TECHNETIUM TC 99M MEDRONATE IV KIT
24.4000 | PACK | Freq: Once | INTRAVENOUS | Status: AC | PRN
  Administered 2015-12-29: 24.4 via INTRAVENOUS

## 2016-01-04 ENCOUNTER — Other Ambulatory Visit: Payer: Self-pay | Admitting: Urology

## 2016-01-04 DIAGNOSIS — K869 Disease of pancreas, unspecified: Secondary | ICD-10-CM

## 2016-01-04 DIAGNOSIS — C61 Malignant neoplasm of prostate: Secondary | ICD-10-CM | POA: Diagnosis not present

## 2016-01-13 ENCOUNTER — Ambulatory Visit (HOSPITAL_COMMUNITY)
Admission: RE | Admit: 2016-01-13 | Discharge: 2016-01-13 | Disposition: A | Discharge status: Home / Self Care | Payer: Medicare Other | Source: Ambulatory Visit | Attending: Urology | Admitting: Urology

## 2016-01-13 DIAGNOSIS — K838 Other specified diseases of biliary tract: Secondary | ICD-10-CM | POA: Diagnosis not present

## 2016-01-13 DIAGNOSIS — K869 Disease of pancreas, unspecified: Secondary | ICD-10-CM | POA: Diagnosis not present

## 2016-01-13 MED ORDER — GADOBENATE DIMEGLUMINE 529 MG/ML IV SOLN
15.0000 mL | Freq: Once | INTRAVENOUS | Status: AC | PRN
  Administered 2016-01-13: 15 mL via INTRAVENOUS

## 2016-01-19 ENCOUNTER — Other Ambulatory Visit: Payer: Self-pay | Admitting: Internal Medicine

## 2016-01-19 MED FILL — !NOVOLOG 70/30 FLEXPEN: 70-30 | 30 days supply | Qty: 6 | Fill #1

## 2016-01-19 MED FILL — TRUE METRIX TEST STRIP: 25 days supply | Qty: 100 | Fill #1

## 2016-01-19 MED FILL — LISINOPRIL-HCTZ 10-12.5 MG: 10-12.5 | 30 days supply | Qty: 30 | Fill #1

## 2016-01-19 MED FILL — ?METFORMIN HCL 500MG TABLET: 500 | 30 days supply | Qty: 30 | Fill #0

## 2016-01-19 MED FILL — AMLODIPINE BESYLATE 10 MG T: 10 | 30 days supply | Qty: 30 | Fill #1

## 2016-01-19 MED FILL — GABAPENTIN 300 MG CAPSULE: 300 | 30 days supply | Qty: 60 | Fill #1

## 2016-02-14 MED FILL — metFORMIN HCL 500 MG TABS: 500 | 30 days supply | Qty: 30 | Fill #1

## 2016-02-14 MED FILL — LISINOPRIL-HCTZ 10-12.5 MG: 10-12.5 | 30 days supply | Qty: 30 | Fill #2

## 2016-02-14 MED FILL — GABAPENTIN 300 MG CAPSULE: 300 | 30 days supply | Qty: 60 | Fill #2

## 2016-02-14 MED FILL — TRUE METRIX TEST STRIP: 25 days supply | Qty: 100 | Fill #2

## 2016-02-14 MED FILL — ULTICARE PEN NDL 4MM 32G: 32G X 4 MM | 50 days supply | Qty: 100 | Fill #1

## 2016-02-14 MED FILL — AMLODIPINE BESYLATE 10 MG T: 10 | 30 days supply | Qty: 30 | Fill #2

## 2016-02-14 MED FILL — !NOVOLOG 70/30 FLEXPEN: 70-30 | 30 days supply | Qty: 6 | Fill #2

## 2016-02-14 MED FILL — TRUEplus LANCETS 28G MISC: 33 days supply | Qty: 100 | Fill #1

## 2016-02-17 ENCOUNTER — Encounter: Payer: Self-pay | Admitting: Radiation Oncology

## 2016-02-17 NOTE — Progress Notes (Signed)
GU Location of Tumor / Histology: prostatic adenocarcinoma  If Prostate Cancer, Gleason Score is (4 + 3) and PSA is (62)at diagnosis.   Robert Sparks explains he was referred in 2015 by Dr. Marylen Ponto for elevated PSA to [facility]  Biopsies of prostate (if applicable) revealed:  Diagnosis 1. Prostate, needle biopsy(ies), right base lateral PROSTATIC ADENOCARCINOMA, GLEASON SCORE 4+3=7 (GRADE GROUP 3) INVOLVES 95% OF ONE CORE (PATTERN 4= 70%). PERINEURAL INVASION IDENTIFIED 2. Prostate, needle biopsy(ies), right base medial PROSTATIC ADENOCARCINOMA, GLEASON SCORE 3+4=7 INVOLVES 70% OF ONE CORE (PATTERN 4= 30%). PERINEURAL INVASION IDENTIFIED 3. Prostate, needle biopsy(ies), right mid lateral PROSTATIC ADENOCARCINOMA, GLEASON SCORE 3+4=7 INVOLVES 100% OF ONE CORE (PATTERN 4= 30%) 4. Prostate, needle biopsy(ies), right mid medial PROSTATIC ADENOCARCINOMA, GLEASON SCORE 3+4=7 INVOLVES 60% OF ONE CORE (PATTERN 4= 30%). PERINEURAL INVASION IDENTIFIED 5. Prostate, needle biopsy(ies), right apex lateral PROSTATIC ADENOCARCINOMA, GLEASON SCORE 3+4=7 INVOLVES 80% OF ONE CORE (PATTERN 4= 40%). PERINEURAL INVASION IDENTIFIED 6. Prostate, needle biopsy(ies), right apex medial PROSTATIC ADENOCARCINOMA, GLEASON SCORE 3+4=7 INVOLVES 40% OF ONE CORE (PATTERN 4= 20%). PERINEURAL INVASION IDENTIFIED 7. Prostate, needle biopsy(ies), left base lateral PROSTATIC ADENOCARCINOMA, GLEASON SCORE 4+3=7 INVOLVES 90% OF ONE CORE (PATTERN 4= 60%) 8. Prostate, needle biopsy(ies), left base medial PROSTATIC ADENOCARCINOMA, GLEASON SCORE 3+4=7 INVOLVES 90% OF ONE CORE (PATTERN 4= 40%). PERINEURAL INVASION IDENTIFIED 9. Prostate, needle biopsy(ies), left mid lateral PROSTATIC ADENOCARCINOMA, GLEASON SCORE 3+4=7 DISCONTINUOUSLY INVOLVES 90% OF ONE CORE (PATTERN 4= 30%). PERINEURAL INVASION IDENTIFIED 10. Prostate, needle biopsy(ies), left mid medial PROSTATIC ADENOCARCINOMA, GLEASON SCORE 3+4=7 INVOLVES 20%  OF ONE CORE (PATTERN 4= 20%). PERINEURAL INVASION IDENTIFIED 11. Prostate, needle biopsy(ies), left apex lateral PROSTATIC ADENOCARCINOMA, GLEASON SCORE 4+3=7 INVOLVES 95% OF ONE CORE (PATTERN 4= 60%) 1 of 3 FINAL for [name], [name] [name]) Diagnosis(continued) 12. Prostate, needle biopsy(ies), left apex medial PROSTATIC ADENOCARCINOMA, GLEASON SCORE 4+3=7 INVOLVES 20% OF ONE CORE (PATTERN 4= 70%). PERINEURAL INVASION IDENTIFIED   Past/Anticipated interventions by urology, if any: biopsy, pre authorization for lupron, and referral to radiation oncology  Past/Anticipated interventions by medical oncology, if any: no  Weight changes, if any: no  Bowel/Bladder complaints, if any: reports nocturia x 3 and erectile dysfunction. Denies dysuria or hematuria. Reports urgency, weak stream and difficulty emptying his bladder on occasion. Reports occasional urinary leakage.  Nausea/Vomiting, if any: no  Pain issues, if any:  Reports intermittent pain in his right shoulder which he "assumes is arthritis."  SAFETY ISSUES:  Prior radiation? no  Pacemaker/ICD? no  Possible current pregnancy? no  Is the patient on methotrexate? no  Current Complaints / other details:  70 year old male. Reports he is retired. Report he has a "friend" but, never married. PROSTATE VOLUME: 41 G.  [place] planning to see patient back for Lupron and gold seeds after he sees [place].

## 2016-02-21 ENCOUNTER — Encounter: Payer: Self-pay | Admitting: Medical Oncology

## 2016-02-21 ENCOUNTER — Ambulatory Visit
Admission: RE | Admit: 2016-02-21 | Discharge: 2016-02-21 | Disposition: A | Discharge status: Home / Self Care | Payer: Medicare Other | Source: Ambulatory Visit | Attending: Radiation Oncology | Admitting: Radiation Oncology

## 2016-02-21 ENCOUNTER — Encounter: Payer: Self-pay | Admitting: Radiation Oncology

## 2016-02-21 DIAGNOSIS — C61 Malignant neoplasm of prostate: Secondary | ICD-10-CM | POA: Diagnosis not present

## 2016-02-21 DIAGNOSIS — M1990 Unspecified osteoarthritis, unspecified site: Secondary | ICD-10-CM | POA: Diagnosis not present

## 2016-02-21 DIAGNOSIS — Z794 Long term (current) use of insulin: Secondary | ICD-10-CM | POA: Insufficient documentation

## 2016-02-21 DIAGNOSIS — N183 Chronic kidney disease, stage 3 (moderate): Secondary | ICD-10-CM | POA: Diagnosis not present

## 2016-02-21 DIAGNOSIS — K219 Gastro-esophageal reflux disease without esophagitis: Secondary | ICD-10-CM | POA: Diagnosis not present

## 2016-02-21 DIAGNOSIS — I129 Hypertensive chronic kidney disease with stage 1 through stage 4 chronic kidney disease, or unspecified chronic kidney disease: Secondary | ICD-10-CM | POA: Insufficient documentation

## 2016-02-21 DIAGNOSIS — Z510 Encounter for antineoplastic radiation therapy: Secondary | ICD-10-CM | POA: Insufficient documentation

## 2016-02-21 DIAGNOSIS — E1142 Type 2 diabetes mellitus with diabetic polyneuropathy: Secondary | ICD-10-CM | POA: Insufficient documentation

## 2016-02-21 DIAGNOSIS — E1310 Other specified diabetes mellitus with ketoacidosis without coma: Secondary | ICD-10-CM | POA: Diagnosis not present

## 2016-02-21 DIAGNOSIS — E1122 Type 2 diabetes mellitus with diabetic chronic kidney disease: Secondary | ICD-10-CM | POA: Insufficient documentation

## 2016-02-21 HISTORY — DX: Malignant neoplasm of prostate: C61

## 2016-02-21 NOTE — Progress Notes (Signed)
Radiation Oncology         [phone] ________________________________  Initial Outpatient Consultation  Name: Robert Sparks MRN: [id number]  Date: 02/21/2016  DOB: 09-Nov-1945  KC:LEXN Lazarus Gowda, MD  Maren Reamer, MD   REFERRING PHYSICIAN: Maren Reamer, MD  DIAGNOSIS: 70 y.o. gentleman with stage T2b adenocarcinoma of the prostate with a Gleason's score of 4+3 and a PSA of 62.  No diagnosis found.  HISTORY OF PRESENT ILLNESS::Robert Sparks is a 70 y.o. gentleman.  He was noted to have an elevated PSA of 62 by his primary care physician, Dr. Janne Napoleon in April 2017. Accordingly, he was referred for evaluation in urology by Dr. Junious Silk on 11/05/2015. He had a digital rectal exam that showed some induration of the right prostate 12/01/2015. The patient proceeded to transrectal ultrasound with 12 biopsies of the prostate under anesthesia on 12/21/2015.  The prostate volume measured 41 cc.  Out of 12 core biopsies, 12 were positive.  The maximum Gleason score was 4+3, and this was seen in the right base lateral, left base lateral, left apex lateral, and left apex medial region of the prostate.  He had an MRI 01/13/2016 that showed no metastatic disease. He had a CT of the abdomen and pelvis and a bone scan 12/29/2015 that showed no metastatic disease.  The patient reviewed the biopsy results with his urologist and he has kindly been referred today for discussion of potential radiation treatment options.  PREVIOUS RADIATION THERAPY: No  PAST MEDICAL HISTORY:  has a past medical history of Arthritis; CKD (chronic kidney disease), stage III; Elevated PSA; GERD (gastroesophageal reflux disease); History of ketoacidosis; History of sepsis; Hypertension; Nocturia; Peripheral neuropathy ([place]); Prostate cancer ([place]); and Type 2 diabetes mellitus with insulin therapy ([place]).    PAST SURGICAL HISTORY: Past Surgical History:  Procedure Laterality Date  . CIRCUMCISION  1990's  . PROSTATE  BIOPSY N/A 12/21/2015   Procedure: BIOPSY TRANSRECTAL ULTRASONIC PROSTATE (TUBP);  Surgeon: Festus Aloe, MD;  Location: [facility];  Service: Urology;  Laterality: N/A;    FAMILY HISTORY: family history is not on file.  SOCIAL HISTORY:  reports that he has been smoking Cigarettes.  He has a 12.50 pack-year smoking history. He has never used smokeless tobacco. He reports that he drinks alcohol. He reports that he does not use drugs.  ALLERGIES: Review of patient's allergies indicates no known allergies.  MEDICATIONS:  Current Outpatient Prescriptions  Medication Sig Dispense Refill  . amLODipine (NORVASC) 10 MG tablet Take 1 tablet (10 mg total) by mouth daily. (Patient taking differently: Take 10 mg by mouth every morning. ) 90 tablet 2  . Blood Glucose Monitoring Suppl (TRUE METRIX GO GLUCOSE METER) w/Device KIT 1 each by Does not apply route every 8 (eight) hours as needed. 1 kit 0  . gabapentin (NEURONTIN) 300 MG capsule Take 1 capsule (300 mg total) by mouth 2 (two) times daily. 60 capsule 2  . glucose blood (TRUE METRIX BLOOD GLUCOSE TEST) test strip Use as instructed 100 each 12  . glucose blood test strip   12  . insulin aspart protamine - aspart (NOVOLOG 70/30 MIX) (70-30) 100 UNIT/ML FlexPen Please take 10units before breakfast and 10 units before supper everyday. 6 mL 2  . Insulin Pen Needle (PEN NEEDLES 3/16") 31G X 5 MM MISC Use one needle twice a day as directed. 30 each 6  . lisinopril-hydrochlorothiazide (PRINZIDE,ZESTORETIC) 10-12.5 MG tablet Take 1 tablet by mouth daily. (Patient taking differently:  Take 1 tablet by mouth every morning. ) 90 tablet 3  . metFORMIN (GLUCOPHAGE) 500 MG tablet Take 1 tablet (500 mg total) by mouth daily with breakfast. Needs office visit for refills 60 tablet 0  . TRUEPLUS LANCETS 26G MISC 1 each by Does not apply route every 8 (eight) hours as needed. 100 each 12  . ULTICARE MICRO PEN NEEDLES 32G X 4 MM MISC   6   No current  facility-administered medications for this encounter.     REVIEW OF SYSTEMS:  On review of systems, he mentions he has urinary frequency and erectile dysfunction. He denies nausea or vomiting. He denies pain. A complete review of systems is obtained and is otherwise negative.  The patient completed an IPSS and IIEF questionnaire.  His IPSS score was 12 indicating moderate urinary outflow obstructive symptoms.    PHYSICAL EXAM: This patient is in no acute distress.  He is alert and oriented.   height is '5\' 7"'  (1.702 m) and weight is 167 lb 11.2 oz (76.1 kg). His blood pressure is 125/82 and his pulse is 88. His respiration is 16 and oxygen saturation is 100%.  He exhibits no respiratory distress or labored breathing.  He appears neurologically intact.  His mood is pleasant.  His affect is appropriate.  Please note the digital rectal exam findings described above.  KPS = 100  100 - Normal; no complaints; no evidence of disease. 90   - Able to carry on normal activity; minor signs or symptoms of disease. 80   - Normal activity with effort; some signs or symptoms of disease. 24   - Cares for self; unable to carry on normal activity or to do active work. 60   - Requires occasional assistance, but is able to care for most of his personal needs. 50   - Requires considerable assistance and frequent medical care. 58   - Disabled; requires special care and assistance. 41   - Severely disabled; hospital admission is indicated although death not imminent. 97   - Very sick; hospital admission necessary; active supportive treatment necessary. 10   - Moribund; fatal processes progressing rapidly. 0     - Dead  Karnofsky DA, Abelmann [place], Craver LS and Burchenal [facility] [phone]) The use of the nitrogen mustards in the palliative treatment of carcinoma: with particular reference to bronchogenic carcinoma Cancer 1 [id number]   LABORATORY DATA:  Lab Results  Component Value Date   WBC 6.0 10/28/2015   HGB 15.0  12/21/2015   HCT 44.0 12/21/2015   MCV 101.0 (H) 10/28/2015   PLT 245 10/28/2015   Lab Results  Component Value Date   NA 129 (L) 12/21/2015   K 4.6 12/21/2015   CL 93 (L) 12/21/2015   CO2 19 (L) 11/01/2015   Lab Results  Component Value Date   ALT 9 11/01/2015   AST 15 11/01/2015   ALKPHOS 62 11/01/2015   BILITOT 0.8 11/01/2015     RADIOGRAPHY: No results found.  IMPRESSION: This gentleman is a 70 yo with adenocarcinoma of the prostate, Gleason 4+3 and a PSA of 62.  His PSA puts him into the high risk group.  Accordingly he is eligible for radiation therapy with ADT x 2 years  PLAN: Today, I talked to the patient and family about the findings and work-up thus far.  We discussed the natural history of prostate cancer and general treatment, highlighting the role of radiotherapy in the management.  We discussed the available radiation techniques,  and focused on the details of logistics and delivery.  We reviewed the anticipated acute and late sequelae associated with radiation in this setting.  The patient was encouraged to ask questions that I answered to the best of my ability.  I filled out a patient counseling form during our discussion including treatment diagrams.  We retained a copy for our records.  The patient would like to proceed with radiation and will be scheduled for CT simulation.  He will follow up with Dr. Junious Silk to start Lupron and for gold seeds. He will start radiation treatment 2 months after he starts Lupron. He will follow up with me in October.  I spent 60 minutes minutes face to face with the patient and more than 50% of that time was spent in counseling and/or coordination of care.   ------------------------------------------------  Sheral Apley. Tammi Klippel, M.D.    This document serves as a record of services personally performed by Tyler Pita, MD. It was created on his behalf by Lendon Collar, a trained medical scribe. The creation of this record is  based on the scribe's personal observations and the provider's statements to them. This document has been checked and approved by the attending provider.

## 2016-02-21 NOTE — Progress Notes (Signed)
See progress note under physician encounter. 

## 2016-02-22 NOTE — Addendum Note (Signed)
Encounter addended by: Heywood Footman, RN on: 02/22/2016 10:54 AM<BR>    Actions taken: Charge Capture section accepted

## 2016-02-23 ENCOUNTER — Ambulatory Visit: Payer: Medicare Other | Attending: Internal Medicine | Admitting: Internal Medicine

## 2016-02-23 ENCOUNTER — Encounter: Payer: Self-pay | Admitting: Internal Medicine

## 2016-02-23 VITALS — BP 132/79 | HR 87 | Temp 98.3°F | Resp 16

## 2016-02-23 DIAGNOSIS — I10 Essential (primary) hypertension: Secondary | ICD-10-CM

## 2016-02-23 DIAGNOSIS — I129 Hypertensive chronic kidney disease with stage 1 through stage 4 chronic kidney disease, or unspecified chronic kidney disease: Secondary | ICD-10-CM | POA: Diagnosis not present

## 2016-02-23 DIAGNOSIS — Z794 Long term (current) use of insulin: Secondary | ICD-10-CM | POA: Insufficient documentation

## 2016-02-23 DIAGNOSIS — K219 Gastro-esophageal reflux disease without esophagitis: Secondary | ICD-10-CM | POA: Insufficient documentation

## 2016-02-23 DIAGNOSIS — Z1211 Encounter for screening for malignant neoplasm of colon: Secondary | ICD-10-CM

## 2016-02-23 DIAGNOSIS — E1122 Type 2 diabetes mellitus with diabetic chronic kidney disease: Secondary | ICD-10-CM

## 2016-02-23 DIAGNOSIS — Z23 Encounter for immunization: Secondary | ICD-10-CM | POA: Diagnosis not present

## 2016-02-23 DIAGNOSIS — E1140 Type 2 diabetes mellitus with diabetic neuropathy, unspecified: Secondary | ICD-10-CM | POA: Diagnosis not present

## 2016-02-23 DIAGNOSIS — N183 Chronic kidney disease, stage 3 unspecified: Secondary | ICD-10-CM | POA: Insufficient documentation

## 2016-02-23 DIAGNOSIS — Z1159 Encounter for screening for other viral diseases: Secondary | ICD-10-CM

## 2016-02-23 DIAGNOSIS — C61 Malignant neoplasm of prostate: Secondary | ICD-10-CM | POA: Insufficient documentation

## 2016-02-23 MED ORDER — INSULIN ASPART PROT & ASPART (70-30 MIX) 100 UNIT/ML PEN: 7.0000 [IU] | PEN_INJECTOR | Freq: Two times a day (BID) | SUBCUTANEOUS | 2 refills | Status: DC

## 2016-02-23 MED ORDER — METFORMIN HCL 500 MG PO TABS: 500.0000 mg | ORAL_TABLET | Freq: Every day | ORAL | 0 refills | Status: DC

## 2016-02-23 NOTE — Patient Instructions (Addendum)
Influenza, Adult Influenza ("the flu") is a viral infection of the respiratory tract. It occurs more often in winter months because people spend more time in close contact with one another. Influenza can make you feel very sick. Influenza easily spreads from person to person (contagious). CAUSES  Influenza is caused by a virus that infects the respiratory tract. You can catch the virus by breathing in droplets from an infected person's cough or sneeze. You can also catch the virus by touching something that was recently contaminated with the virus and then touching your mouth, nose, or eyes. RISKS AND COMPLICATIONS You may be at risk for a more severe case of influenza if you smoke cigarettes, have diabetes, have chronic heart disease (such as heart failure) or lung disease (such as asthma), or if you have a weakened immune system. Elderly people and pregnant women are also at risk for more serious infections. The most common problem of influenza is a lung infection (pneumonia). Sometimes, this problem can require emergency medical care and may be life threatening. SIGNS AND SYMPTOMS  Symptoms typically last 4 to 10 days and may include:  Fever.  Chills.  Headache, body aches, and muscle aches.  Sore throat.  Chest discomfort and cough.  Poor appetite.  Weakness or feeling tired.  Dizziness.  Nausea or vomiting. DIAGNOSIS  Diagnosis of influenza is often made based on your history and a physical exam. A nose or throat swab test can be done to confirm the diagnosis. TREATMENT  In mild cases, influenza goes away on its own. Treatment is directed at relieving symptoms. For more severe cases, your health care provider may prescribe antiviral medicines to shorten the sickness. Antibiotic medicines are not effective because the infection is caused by a virus, not by bacteria. HOME CARE INSTRUCTIONS  Take medicines only as directed by your health care provider.  Use a cool mist humidifier  to make breathing easier.  Get plenty of rest until your temperature returns to normal. This usually takes 3 to 4 days.  Drink enough fluid to keep your urine clear or pale yellow.  Cover yourmouth and nosewhen coughing or sneezing,and wash your handswellto prevent thevirusfrom spreading.  Stay homefromwork orschool untilthe fever is gonefor at least 50full day. PREVENTION  An annual influenza vaccination (flu shot) is the best way to avoid getting influenza. An annual flu shot is now routinely recommended for all adults in the [place] IF:  You experiencechest pain, yourcough worsens,or you producemore mucus.  Youhave nausea,vomiting, ordiarrhea.  Your fever returns or gets worse. SEEK IMMEDIATE MEDICAL CARE IF:  You havetrouble breathing, you become short of breath,or your skin ornails becomebluish.  You have severe painor stiffnessin the neck.  You develop a sudden headache, or pain in the face or ear.  You have nausea or vomiting that you cannot control. MAKE SURE YOU:   Understand these instructions.  Will watch your condition.  Will get help right away if you are not doing well or get worse.   This information is not intended to replace advice given to you by your health care provider. Make sure you discuss any questions you have with your health care provider.   Document Released: 06/23/2000 Document Revised: 07/17/2014 Document Reviewed: 09/25/2011 Elsevier Interactive Patient Education 2016 Elsevier Inc. Pneumococcal Conjugate Vaccine (PCV13)  1. Why get vaccinated? Vaccination can protect both children and adults from pneumococcal disease. Pneumococcal disease is caused by bacteria that can spread from person to person through close contact.  It can cause ear infections, and it can also lead to more serious infections of the:  Lungs (pneumonia),  Blood (bacteremia), and  Covering of the brain and spinal cord  (meningitis). Pneumococcal pneumonia is most common among adults. Pneumococcal meningitis can cause deafness and brain damage, and it kills about 1 child in 10 who get it. Anyone can get pneumococcal disease, but children under 71 years of age and adults 56 years and older, people with certain medical conditions, and cigarette smokers are at the highest risk. Before there was a vaccine, the [place] saw:  more than 700 cases of meningitis,  about 13,000 blood infections,  about 5 million ear infections, and  about 200 deaths in children under 5 each year from pneumococcal disease. Since vaccine became available, severe pneumococcal disease in these children has fallen by 88%. About 18,000 older adults die of pneumococcal disease each year in the [place]. Treatment of pneumococcal infections with penicillin and other drugs is not as effective as it used to be, because some strains of the disease have become resistant to these drugs. This makes prevention of the disease, through vaccination, even more important. 2. PCV13 vaccine Pneumococcal conjugate vaccine (called PCV13) protects against 13 types of pneumococcal bacteria. PCV13 is routinely given to children at 2, 4, 6, and 66-58 months of age. It is also recommended for children and adults 5 to 48 years of age with certain health conditions, and for all adults 47 years of age and older. Your doctor can give you details. 3. Some people should not get this vaccine Anyone who has ever had a life-threatening allergic reaction to a dose of this vaccine, to an earlier pneumococcal vaccine called PCV7, or to any vaccine containing diphtheria toxoid (for example, DTaP), should not get PCV13. Anyone with a severe allergy to any component of PCV13 should not get the vaccine. Tell your doctor if the person being vaccinated has any severe allergies. If the person scheduled for vaccination is not feeling well, your healthcare provider might  decide to reschedule the shot on another day. 4. Risks of a vaccine reaction With any medicine, including vaccines, there is a chance of reactions. These are usually mild and go away on their own, but serious reactions are also possible. Problems reported following PCV13 varied by age and dose in the series. The most common problems reported among children were:  About half became drowsy after the shot, had a temporary loss of appetite, or had redness or tenderness where the shot was given.  About 1 out of 3 had swelling where the shot was given.  About 1 out of 3 had a mild fever, and about 1 in 20 had a fever over 102.19F.  Up to about 8 out of 10 became fussy or irritable. Adults have reported pain, redness, and swelling where the shot was given; also mild fever, fatigue, headache, chills, or muscle pain. Young children who get PCV13 along with inactivated flu vaccine at the same time may be at increased risk for seizures caused by fever. Ask your doctor for more information. Problems that could happen after any vaccine:  People sometimes faint after a medical procedure, including vaccination. Sitting or lying down for about 15 minutes can help prevent fainting, and injuries caused by a fall. Tell your doctor if you feel dizzy, or have vision changes or ringing in the ears.  Some older children and adults get severe pain in the shoulder and have difficulty moving the arm  where a shot was given. This happens very rarely.  Any medication can cause a severe allergic reaction. Such reactions from a vaccine are very rare, estimated at about 1 in a million doses, and would happen within a few minutes to a few hours after the vaccination. As with any medicine, there is a very small chance of a vaccine causing a serious injury or death. The safety of vaccines is always being monitored. For more information, visit: http://floyd.org/ 5. What if there is a serious reaction? What should I  look for?  Look for anything that concerns you, such as signs of a severe allergic reaction, very high fever, or unusual behavior. Signs of a severe allergic reaction can include hives, swelling of the face and throat, difficulty breathing, a fast heartbeat, dizziness, and weakness-usually within a few minutes to a few hours after the vaccination. What should I do?  If you think it is a severe allergic reaction or other emergency that can't wait, call 9-1-1 or get the person to the nearest hospital. Otherwise, call your doctor. Reactions should be reported to the Vaccine Adverse Event Reporting System (VAERS). Your doctor should file this report, or you can do it yourself through the VAERS web site at www.vaers.LAgents.no, or by calling 1-726-577-2877. VAERS does not give medical advice. 6. The National Vaccine Injury Compensation Program The [employer] Vaccine Injury Compensation Program (VICP) is a federal program that was created to compensate people who may have been injured by certain vaccines. Persons who believe they may have been injured by a vaccine can learn about the program and about filing a claim by calling 1-616-639-7448 or visiting the VICP website at SpiritualWord.at. There is a time limit to file a claim for compensation. 7. How can I learn more?  Ask your healthcare provider. He or she can give you the vaccine package insert or suggest other sources of information.  Call your local or state health department.  Contact the Centers for Disease Control and Prevention (CDC):  Call (630) 846-3761 (1-800-CDC-INFO) or  Visit CDC's website at PicCapture.uy Vaccine Information Statement PCV13 Vaccine (05/14/2014)   This information is not intended to replace advice given to you by your health care provider. Make sure you discuss any questions you have with your health care provider.   Document Released: 04/23/2006 Document Revised: 07/17/2014 Document Reviewed:  05/21/2014 Elsevier Interactive Patient Education 2016 Elsevier Inc.   - DASH Eating Plan DASH stands for "Dietary Approaches to Stop Hypertension." The DASH eating plan is a healthy eating plan that has been shown to reduce high blood pressure (hypertension). Additional health benefits may include reducing the risk of type 2 diabetes mellitus, heart disease, and stroke. The DASH eating plan may also help with weight loss. WHAT DO I NEED TO KNOW ABOUT THE DASH EATING PLAN? For the DASH eating plan, you will follow these general guidelines:  Choose foods with a percent daily value for sodium of less than 5% (as listed on the food label).  Use salt-free seasonings or herbs instead of table salt or sea salt.  Check with your health care provider or pharmacist before using salt substitutes.  Eat lower-sodium products, often labeled as "lower sodium" or "no salt added."  Eat fresh foods.  Eat more vegetables, fruits, and low-fat dairy products.  Choose whole grains. Look for the word "whole" as the first word in the ingredient list.  Choose fish and skinless chicken or [place] more often than red meat. Limit fish, poultry, and meat to  6 oz (170 g) each day.  Limit sweets, desserts, sugars, and sugary drinks.  Choose heart-healthy fats.  Limit cheese to 1 oz (28 g) per day.  Eat more home-cooked food and less restaurant, buffet, and fast food.  Limit fried foods.  Cook foods using methods other than frying.  Limit canned vegetables. If you do use them, rinse them well to decrease the sodium.  When eating at a restaurant, ask that your food be prepared with less salt, or no salt if possible. WHAT FOODS CAN I EAT? Seek help from a dietitian for individual calorie needs. Grains Whole grain or whole wheat bread. Brown rice. Whole grain or whole wheat pasta. Quinoa, bulgur, and whole grain cereals. Low-sodium cereals. Corn or whole wheat flour tortillas. Whole grain cornbread. Whole  grain crackers. Low-sodium crackers. Vegetables Fresh or frozen vegetables (raw, steamed, roasted, or grilled). Low-sodium or reduced-sodium tomato and vegetable juices. Low-sodium or reduced-sodium tomato sauce and paste. Low-sodium or reduced-sodium canned vegetables.  Fruits All fresh, canned (in natural juice), or frozen fruits. Meat and Other Protein Products Ground beef (85% or leaner), grass-fed beef, or beef trimmed of fat. Skinless chicken or [place]. Ground chicken or [place]. Pork trimmed of fat. All fish and seafood. Eggs. Dried beans, peas, or lentils. Unsalted nuts and seeds. Unsalted canned beans. Dairy Low-fat dairy products, such as skim or 1% milk, 2% or reduced-fat cheeses, low-fat ricotta or cottage cheese, or plain low-fat yogurt. Low-sodium or reduced-sodium cheeses. Fats and Oils Tub margarines without trans fats. Light or reduced-fat mayonnaise and salad dressings (reduced sodium). Avocado. Safflower, olive, or canola oils. Natural peanut or almond butter. Other Unsalted popcorn and pretzels. The items listed above may not be a complete list of recommended foods or beverages. Contact your dietitian for more options. WHAT FOODS ARE NOT RECOMMENDED? Grains White bread. White pasta. White rice. Refined cornbread. Bagels and croissants. Crackers that contain trans fat. Vegetables Creamed or fried vegetables. Vegetables in a cheese sauce. Regular canned vegetables. Regular canned tomato sauce and paste. Regular tomato and vegetable juices. Fruits Dried fruits. Canned fruit in light or heavy syrup. Fruit juice. Meat and Other Protein Products Fatty cuts of meat. Ribs, chicken wings, bacon, sausage, bologna, salami, chitterlings, fatback, hot dogs, bratwurst, and packaged luncheon meats. Salted nuts and seeds. Canned beans with salt. Dairy Whole or 2% milk, cream, half-and-half, and cream cheese. Whole-fat or sweetened yogurt. Full-fat cheeses or blue cheese. Nondairy creamers  and whipped toppings. Processed cheese, cheese spreads, or cheese curds. Condiments Onion and garlic salt, seasoned salt, table salt, and sea salt. Canned and packaged gravies. Worcestershire sauce. Tartar sauce. Barbecue sauce. Teriyaki sauce. Soy sauce, including reduced sodium. Steak sauce. Fish sauce. Oyster sauce. Cocktail sauce. Horseradish. Ketchup and mustard. Meat flavorings and tenderizers. Bouillon cubes. Hot sauce. Tabasco sauce. Marinades. Taco seasonings. Relishes. Fats and Oils Butter, stick margarine, lard, shortening, ghee, and bacon fat. Coconut, palm kernel, or palm oils. Regular salad dressings. Other Pickles and olives. Salted popcorn and pretzels. The items listed above may not be a complete list of foods and beverages to avoid. Contact your dietitian for more information. WHERE CAN I FIND MORE INFORMATION? National Heart, Lung, and Blood Institute: travelstabloid.com   This information is not intended to replace advice given to you by your health care provider. Make sure you discuss any questions you have with your health care provider.   Document Released: 06/15/2011 Document Revised: 07/17/2014 Document Reviewed: 04/30/2013 Elsevier Interactive Patient Education [employer].

## 2016-02-23 NOTE — Progress Notes (Signed)
Pt is in the office today for diabetes Pt states he is not in any pain today

## 2016-02-23 NOTE — Progress Notes (Signed)
Robert Sparks, is a 70 y.o. male  [id number]  [id number]:[id number]  DOB - [birth year]  Chief Complaint  Patient presents with  . Diabetes        Subjective:   Robert Sparks is a 70 y.o. male here today for a follow up visit, Last seen in clinic on 11/29/2015, with significant past medical history diabetes type 2, hypertension, recently diagnosed stage T2b adenocarcinoma of the prostate, Gleason score was 4+3, as well as chronic kidney disease.  Patient was recently evaluated by radiation oncology.  He is otherwise doing well he's been checking his Accu-Cheks and brought his machine in for me his numbers have been running in the 80s to 150s, with occasional lows and 65-66 (2 readings total out of numerous readings) as well as a high of 200.  He is watching his diet a bit better, and taking all his medications, including daily metformin, as well as his NovoLog 7030 10 units 2 times a day.  C/o of some fatigue at times.  bms normal, but has never had colonoscopy prior.  Patient has No headache, No chest pain, No abdominal pain - No Nausea, No new weakness tingling or numbness, No Cough - SOB.  Problem  Ckd Stage 3 Due to Type 2 Diabetes Mellitus (Hcc)    ALLERGIES: No Known Allergies  PAST MEDICAL HISTORY: Past Medical History:  Diagnosis Date  . Arthritis    shoulders  . CKD (chronic kidney disease), stage III   . Elevated PSA   . GERD (gastroesophageal reflux disease)   . History of ketoacidosis    03-29-2015   . History of sepsis    07-25-2013  non-compliant w/ medication  . Hypertension   . Nocturia   . Peripheral neuropathy ([place])   . Prostate cancer ([place])   . Type 2 diabetes mellitus with insulin therapy ([place])     MEDICATIONS AT HOME: Prior to Admission medications   Medication Sig Start Date End Date Taking? Authorizing Provider  amLODipine (NORVASC) 10 MG tablet Take 1 tablet (10 mg total) by mouth daily. Patient taking differently: Take 10 mg by mouth every  morning.  11/29/15  Yes Maren Reamer, MD  Blood Glucose Monitoring Suppl (TRUE METRIX GO GLUCOSE METER) w/Device KIT 1 each by Does not apply route every 8 (eight) hours as needed. 11/01/15  Yes Maren Reamer, MD  gabapentin (NEURONTIN) 300 MG capsule Take 1 capsule (300 mg total) by mouth 2 (two) times daily. 11/01/15  Yes Maren Reamer, MD  glucose blood (TRUE METRIX BLOOD GLUCOSE TEST) test strip Use as instructed 11/29/15  Yes Maren Reamer, MD  glucose blood test strip  11/29/15  Yes Historical Provider, MD  insulin aspart protamine - aspart (NOVOLOG 70/30 MIX) (70-30) 100 UNIT/ML FlexPen Inject 0.07 mLs (7 Units total) into the skin 2 (two) times daily with a meal. 02/23/16  Yes Maren Reamer, MD  Insulin Pen Needle (PEN NEEDLES 3/16") 31G X 5 MM MISC Use one needle twice a day as directed. 10/28/15  Yes Veryl Speak, MD  lisinopril-hydrochlorothiazide (PRINZIDE,ZESTORETIC) 10-12.5 MG tablet Take 1 tablet by mouth daily. Patient taking differently: Take 1 tablet by mouth every morning.  11/29/15  Yes Maren Reamer, MD  metFORMIN (GLUCOPHAGE) 500 MG tablet Take 1 tablet (500 mg total) by mouth daily with breakfast. Needs office visit for refills 02/23/16  Yes Maren Reamer, MD  TRUEPLUS LANCETS 26G MISC 1 each by Does not apply route every 8 (eight) hours  as needed. 11/01/15  Yes Melesio Madara Lazarus Gowda, MD  ULTICARE MICRO PEN NEEDLES 32G X 4 MM MISC  11/29/15  Yes Historical Provider, MD     Objective:   Vitals:   02/23/16 1227  BP: 132/79  Pulse: 87  Resp: 16  Temp: 98.3 F (36.8 C)  TempSrc: Oral  SpO2: 97%    Exam General appearance : Awake, alert, not in any distress. Speech Clear. Not toxic looking, pleasant. HEENT: Atraumatic and Normocephalic, pupils equally reactive to light. Neck: supple, no JVD.  Chest:Good air entry bilaterally, no added sounds. CVS: S1 S2 regular, no murmurs/gallups or rubs. Abdomen: Bowel sounds active, obese, Non tender and not distended with  no gaurding, rigidity or rebound. Extremities: B/L Lower Ext shows no edema, both legs are warm to touch Neurology: Awake alert, and oriented X 3, CN II-XII grossly intact, Non focal Skin:No Rash  Data Review Lab Results  Component Value Date   HGBA1C 10.0 11/01/2015   HGBA1C 10.5 (H) 03/30/2015   HGBA1C 5.9 (H) 07/27/2013    Depression screen PHQ 2/9 02/23/2016 02/21/2016 11/29/2015 11/10/2015 11/01/2015  Decreased Interest 0 0 0 0 0  Down, Depressed, Hopeless 0 0 0 0 0  PHQ - 2 Score 0 0 0 0 0  Altered sleeping - - - - 2  Tired, decreased energy - - - - 3  Change in appetite - - - - 0  Feeling bad or failure about yourself  - - - - 0  Trouble concentrating - - - - 0  Moving slowly or fidgety/restless - - - - 0  Suicidal thoughts - - - - 0  PHQ-9 Score - - - - 5      Assessment & Plan   1. Essential hypertension Well controlled, dash diet discussed - Continue Prinzide 10-12.5 daily, as well as Norvasc 10 mg daily for now - BASIC METABOLIC PANEL WITH GFR  2. Controlled type 2 diabetes mellitus with stage 3 chronic kidney disease, with long-term current use of insulin (HCC) - Much improved. A1c today is 6.3, from 10. - We'll reduce his NovoLog 7030 mix to 7 units twice a day. -  we'll stop his metformin for now given history of CK D and then some hypoglycemic events   3. Malignant neoplasm of prostate ([place]),  stage T2b adenocarcinoma of the prostate, Gleason score was 4+3,  - Patient will be initiating radiation treatments 2 months after he starts on Lupron injections with Dr. Junious Silk   4. Need for hepatitis C screening test - Hepatitis C antibody  5. Colon cancer screening - Ambulatory referral to Gastroenterology  6. Flu vaccine need - Flu Vaccine QUAD 36+ mos PF IM (Fluarix & Fluzone Quad PF)  7. Pneumococcal 23 valent vaccine today as well  8. Health maintenance - hold off on tdap til next time.   Patient have been counseled extensively about nutrition and  exercise  Return in about 3 months (around 05/25/2016).  The patient was given clear instructions to go to ER or return to medical center if symptoms don't improve, worsen or new problems develop. The patient verbalized understanding. The patient was told to call to get lab results if they haven't heard anything in the next week.   This note has been created with Surveyor, quantity. Any transcriptional errors are unintentional.   Maren Reamer, MD, [place] [facility] [place], [place]   02/23/2016, 1:10 PM

## 2016-02-24 ENCOUNTER — Telehealth: Payer: Self-pay | Admitting: *Deleted

## 2016-02-24 NOTE — Telephone Encounter (Signed)
Called patient to inform of androgen dep. Appt. On 02-25-16- arrival time - 12:30 pm @ [facility]  And his gold seed placement on 03-29-16 @ 2:30 pm @ [facility] and his sim on 03-31-16 @ 1pm with Dr. Tammi Klippel, spoke with patient and he is aware of these appts.

## 2016-02-25 DIAGNOSIS — N400 Enlarged prostate without lower urinary tract symptoms: Secondary | ICD-10-CM | POA: Diagnosis not present

## 2016-02-25 DIAGNOSIS — C61 Malignant neoplasm of prostate: Secondary | ICD-10-CM | POA: Diagnosis not present

## 2016-03-21 ENCOUNTER — Other Ambulatory Visit: Payer: Self-pay | Admitting: Internal Medicine

## 2016-03-21 MED FILL — TRUE METRIX TEST STRIP: 25 days supply | Qty: 100 | Fill #3

## 2016-03-21 MED FILL — AMLODIPINE BESYLATE 10 MG T: 10 | 30 days supply | Qty: 30 | Fill #3

## 2016-03-21 MED FILL — levoFLOXacin 500 MG TABS: 500 | 3 days supply | Qty: 3 | Fill #0

## 2016-03-21 MED FILL — GABAPENTIN 300 MG CAPSULE: 300 | 30 days supply | Qty: 60 | Fill #0

## 2016-03-21 MED FILL — !NOVOLOG 70/30 FLEXPEN: 70-30 | 21 days supply | Qty: 3 | Fill #0

## 2016-03-21 MED FILL — LISINOPRIL-HCTZ 10-12.5 MG: 10-12.5 | 30 days supply | Qty: 30 | Fill #3

## 2016-03-29 ENCOUNTER — Telehealth: Payer: Self-pay | Admitting: *Deleted

## 2016-03-29 ENCOUNTER — Telehealth: Payer: Self-pay | Admitting: Radiation Oncology

## 2016-03-29 DIAGNOSIS — C61 Malignant neoplasm of prostate: Secondary | ICD-10-CM | POA: Diagnosis not present

## 2016-03-29 NOTE — Telephone Encounter (Signed)
Called patient to inform that he needs 2 months of hormones before he has his sim, appt. Moved from 03-31-16 to 04-21-16 @ 2 pm with Dr. Tammi Klippel, spoke with patient and he is aware of this appt. Being moved and why it was moved.

## 2016-03-29 NOTE — Telephone Encounter (Signed)
I called the pt to find out if he could switch times for his simulation on Friday. He says he had an ADT injection but does not know the date of when this took place. He goes for gold fiducials today with Dr. Junious Silk at 2:30 pm.

## 2016-03-31 ENCOUNTER — Ambulatory Visit: Payer: Medicare Other | Admitting: Radiation Oncology

## 2016-04-19 MED FILL — AMLODIPINE BESYLATE 10 MG T: 10 | 30 days supply | Qty: 30 | Fill #4

## 2016-04-19 MED FILL — GABAPENTIN 300 MG CAPSULE: 300 | 30 days supply | Qty: 60 | Fill #1

## 2016-04-19 MED FILL — TRUE METRIX GLUCOSE TEST ST: 25 days supply | Qty: 100 | Fill #4

## 2016-04-19 MED FILL — !NOVOLOG 70/30 FLEXPEN: 70-30 | 21 days supply | Qty: 3 | Fill #1

## 2016-04-19 MED FILL — LISINOPRIL-HCTZ 10-12.5 MG: 10-12.5 | 30 days supply | Qty: 30 | Fill #4

## 2016-04-21 ENCOUNTER — Ambulatory Visit
Admission: RE | Admit: 2016-04-21 | Discharge: 2016-04-21 | Disposition: A | Discharge status: Home / Self Care | Payer: Medicare Other | Source: Ambulatory Visit | Attending: Radiation Oncology | Admitting: Radiation Oncology

## 2016-04-21 DIAGNOSIS — K219 Gastro-esophageal reflux disease without esophagitis: Secondary | ICD-10-CM | POA: Diagnosis not present

## 2016-04-21 DIAGNOSIS — E1122 Type 2 diabetes mellitus with diabetic chronic kidney disease: Secondary | ICD-10-CM | POA: Diagnosis not present

## 2016-04-21 DIAGNOSIS — E1310 Other specified diabetes mellitus with ketoacidosis without coma: Secondary | ICD-10-CM | POA: Diagnosis not present

## 2016-04-21 DIAGNOSIS — E1142 Type 2 diabetes mellitus with diabetic polyneuropathy: Secondary | ICD-10-CM | POA: Diagnosis not present

## 2016-04-21 DIAGNOSIS — Z510 Encounter for antineoplastic radiation therapy: Secondary | ICD-10-CM | POA: Diagnosis not present

## 2016-04-21 DIAGNOSIS — I129 Hypertensive chronic kidney disease with stage 1 through stage 4 chronic kidney disease, or unspecified chronic kidney disease: Secondary | ICD-10-CM | POA: Diagnosis not present

## 2016-04-21 DIAGNOSIS — C61 Malignant neoplasm of prostate: Secondary | ICD-10-CM | POA: Diagnosis not present

## 2016-04-21 DIAGNOSIS — M1990 Unspecified osteoarthritis, unspecified site: Secondary | ICD-10-CM | POA: Diagnosis not present

## 2016-04-21 DIAGNOSIS — Z794 Long term (current) use of insulin: Secondary | ICD-10-CM | POA: Diagnosis not present

## 2016-04-21 DIAGNOSIS — N183 Chronic kidney disease, stage 3 (moderate): Secondary | ICD-10-CM | POA: Diagnosis not present

## 2016-04-21 NOTE — Progress Notes (Signed)
  Radiation Oncology         [phone] ________________________________  Name: Robert Sparks MRN: [id number]  Date: 04/21/2016  DOB: [birth year]  SIMULATION AND TREATMENT PLANNING NOTE    ICD-9-CM ICD-10-CM   1. Malignant neoplasm of prostate ([place]) 93 C61     DIAGNOSIS:69 yo with stage T2b adenocarcinoma of the prostate, Gleason 4+3 and a PSA of 62  NARRATIVE:  The patient was brought to the [facility].  Identity was confirmed.  All relevant records and images related to the planned course of therapy were reviewed.  The patient freely provided informed written consent to proceed with treatment after reviewing the details related to the planned course of therapy. The consent form was witnessed and verified by the simulation staff.  Then, the patient was set-up in a stable reproducible supine position for radiation therapy.  A vacuum lock pillow device was custom fabricated to position his legs in a reproducible immobilized position.  Then, I performed a urethrogram under sterile conditions to identify the prostatic apex.  CT images were obtained.  Surface markings were placed.  The CT images were loaded into the planning software.  Then the prostate target and avoidance structures including the rectum, bladder, bowel and hips were contoured.  Treatment planning then occurred.  The radiation prescription was entered and confirmed.  A total of one complex treatment devices were fabricated. I have requested : Intensity Modulated Radiotherapy (IMRT) is medically necessary for this case for the following reason:  Rectal sparing.[name]  PLAN:  The patient will receive 75 Gy in 40 fractions with pelvic nodes to 45 Gy then boost.  ________________________________  Sheral Apley. Tammi Klippel, M.D.

## 2016-04-24 ENCOUNTER — Telehealth: Payer: Self-pay

## 2016-04-24 ENCOUNTER — Ambulatory Visit (AMBULATORY_SURGERY_CENTER): Payer: Self-pay

## 2016-04-24 VITALS — Ht 66.0 in | Wt 163.2 lb

## 2016-04-24 DIAGNOSIS — Z1211 Encounter for screening for malignant neoplasm of colon: Secondary | ICD-10-CM

## 2016-04-24 NOTE — Telephone Encounter (Signed)
LMOM for pt to call us back to schedule appt with APP to discuss all options I.e. Cologuard.                Angela/PV

## 2016-04-24 NOTE — Telephone Encounter (Signed)
Dr Hilarie Fredrickson,      Scheduled for colon on 10/30.  Supposed to start radiation on 10/24 per pt and had radioactive seeds placed "last week".  Had PV today on 10/16.  ?OK to proceed?                                                               Thank you,                                                                     Ginia Rudell/PV

## 2016-04-24 NOTE — Progress Notes (Signed)
No allergies to eggs or soy No past problems with anesthesia No home oxygen No diet meds  No internet 

## 2016-04-24 NOTE — Telephone Encounter (Signed)
Would ask for urology input I would recommend delaying if undergoing external beam radiation I would have him seen an APP to help sort out and determine timing, versus another screening modality such as Cologuard

## 2016-05-01 DIAGNOSIS — Z794 Long term (current) use of insulin: Secondary | ICD-10-CM | POA: Diagnosis not present

## 2016-05-01 DIAGNOSIS — I129 Hypertensive chronic kidney disease with stage 1 through stage 4 chronic kidney disease, or unspecified chronic kidney disease: Secondary | ICD-10-CM | POA: Diagnosis not present

## 2016-05-01 DIAGNOSIS — C61 Malignant neoplasm of prostate: Secondary | ICD-10-CM | POA: Diagnosis not present

## 2016-05-01 DIAGNOSIS — N183 Chronic kidney disease, stage 3 (moderate): Secondary | ICD-10-CM | POA: Diagnosis not present

## 2016-05-01 DIAGNOSIS — E1122 Type 2 diabetes mellitus with diabetic chronic kidney disease: Secondary | ICD-10-CM | POA: Diagnosis not present

## 2016-05-01 DIAGNOSIS — Z510 Encounter for antineoplastic radiation therapy: Secondary | ICD-10-CM | POA: Diagnosis not present

## 2016-05-02 ENCOUNTER — Ambulatory Visit
Admission: RE | Admit: 2016-05-02 | Discharge: 2016-05-02 | Disposition: A | Discharge status: Home / Self Care | Payer: Medicare Other | Source: Ambulatory Visit | Attending: Radiation Oncology | Admitting: Radiation Oncology

## 2016-05-02 DIAGNOSIS — Z794 Long term (current) use of insulin: Secondary | ICD-10-CM | POA: Diagnosis not present

## 2016-05-02 DIAGNOSIS — I129 Hypertensive chronic kidney disease with stage 1 through stage 4 chronic kidney disease, or unspecified chronic kidney disease: Secondary | ICD-10-CM | POA: Diagnosis not present

## 2016-05-02 DIAGNOSIS — N183 Chronic kidney disease, stage 3 (moderate): Secondary | ICD-10-CM | POA: Diagnosis not present

## 2016-05-02 DIAGNOSIS — E1122 Type 2 diabetes mellitus with diabetic chronic kidney disease: Secondary | ICD-10-CM | POA: Diagnosis not present

## 2016-05-02 DIAGNOSIS — Z510 Encounter for antineoplastic radiation therapy: Secondary | ICD-10-CM | POA: Diagnosis not present

## 2016-05-02 DIAGNOSIS — C61 Malignant neoplasm of prostate: Secondary | ICD-10-CM | POA: Diagnosis not present

## 2016-05-03 ENCOUNTER — Ambulatory Visit
Admission: RE | Admit: 2016-05-03 | Discharge: 2016-05-03 | Disposition: A | Discharge status: Home / Self Care | Payer: Medicare Other | Source: Ambulatory Visit | Attending: Radiation Oncology | Admitting: Radiation Oncology

## 2016-05-03 DIAGNOSIS — E1122 Type 2 diabetes mellitus with diabetic chronic kidney disease: Secondary | ICD-10-CM | POA: Diagnosis not present

## 2016-05-03 DIAGNOSIS — C61 Malignant neoplasm of prostate: Secondary | ICD-10-CM | POA: Diagnosis not present

## 2016-05-03 DIAGNOSIS — Z794 Long term (current) use of insulin: Secondary | ICD-10-CM | POA: Diagnosis not present

## 2016-05-03 DIAGNOSIS — I129 Hypertensive chronic kidney disease with stage 1 through stage 4 chronic kidney disease, or unspecified chronic kidney disease: Secondary | ICD-10-CM | POA: Diagnosis not present

## 2016-05-03 DIAGNOSIS — N183 Chronic kidney disease, stage 3 (moderate): Secondary | ICD-10-CM | POA: Diagnosis not present

## 2016-05-03 DIAGNOSIS — Z510 Encounter for antineoplastic radiation therapy: Secondary | ICD-10-CM | POA: Diagnosis not present

## 2016-05-04 ENCOUNTER — Ambulatory Visit
Admission: RE | Admit: 2016-05-04 | Discharge: 2016-05-04 | Disposition: A | Discharge status: Home / Self Care | Payer: Medicare Other | Source: Ambulatory Visit | Attending: Radiation Oncology | Admitting: Radiation Oncology

## 2016-05-04 DIAGNOSIS — Z794 Long term (current) use of insulin: Secondary | ICD-10-CM | POA: Diagnosis not present

## 2016-05-04 DIAGNOSIS — N183 Chronic kidney disease, stage 3 (moderate): Secondary | ICD-10-CM | POA: Diagnosis not present

## 2016-05-04 DIAGNOSIS — C61 Malignant neoplasm of prostate: Secondary | ICD-10-CM | POA: Diagnosis not present

## 2016-05-04 DIAGNOSIS — I129 Hypertensive chronic kidney disease with stage 1 through stage 4 chronic kidney disease, or unspecified chronic kidney disease: Secondary | ICD-10-CM | POA: Diagnosis not present

## 2016-05-04 DIAGNOSIS — Z510 Encounter for antineoplastic radiation therapy: Secondary | ICD-10-CM | POA: Diagnosis not present

## 2016-05-04 DIAGNOSIS — E1122 Type 2 diabetes mellitus with diabetic chronic kidney disease: Secondary | ICD-10-CM | POA: Diagnosis not present

## 2016-05-05 ENCOUNTER — Encounter: Payer: Self-pay | Admitting: Radiation Oncology

## 2016-05-05 ENCOUNTER — Ambulatory Visit
Admission: RE | Admit: 2016-05-05 | Discharge: 2016-05-05 | Disposition: A | Discharge status: Home / Self Care | Payer: Medicare Other | Source: Ambulatory Visit | Attending: Radiation Oncology | Admitting: Radiation Oncology

## 2016-05-05 VITALS — BP 102/71 | HR 82 | Resp 16 | Wt 164.0 lb

## 2016-05-05 DIAGNOSIS — C61 Malignant neoplasm of prostate: Secondary | ICD-10-CM

## 2016-05-05 DIAGNOSIS — I129 Hypertensive chronic kidney disease with stage 1 through stage 4 chronic kidney disease, or unspecified chronic kidney disease: Secondary | ICD-10-CM | POA: Diagnosis not present

## 2016-05-05 DIAGNOSIS — N183 Chronic kidney disease, stage 3 (moderate): Secondary | ICD-10-CM | POA: Diagnosis not present

## 2016-05-05 DIAGNOSIS — Z510 Encounter for antineoplastic radiation therapy: Secondary | ICD-10-CM | POA: Diagnosis not present

## 2016-05-05 DIAGNOSIS — E1122 Type 2 diabetes mellitus with diabetic chronic kidney disease: Secondary | ICD-10-CM | POA: Diagnosis not present

## 2016-05-05 DIAGNOSIS — Z794 Long term (current) use of insulin: Secondary | ICD-10-CM | POA: Diagnosis not present

## 2016-05-05 NOTE — Progress Notes (Signed)
  Radiation Oncology         [phone]   Name: Robert Sparks MRN: [id number]   Date: 05/05/2016  DOB: 1946/03/06   Weekly Radiation Therapy Management    ICD-9-CM ICD-10-CM   1. Malignant neoplasm of prostate ([place]) 185 C61     Current Dose: 7.2 Gy  Planned Dose:  75 Gy  Narrative The patient presents for routine under treatment assessment.  Weight and vitals stable. He denies pain. The patient reports nocturia x 3. He denies dysuria or hematuria. He describes his urine stream as intermittent with occasional difficulty emptying his bladder. He denies urgency, but reports occasional leakage. He reports on occasion he has constipation which he manages with milk of magnesia or a laxative. Denies fatigue.    The patient is without complaint. Set-up films were reviewed. The chart was checked.  Physical Findings  weight is 164 lb (74.4 kg). His blood pressure is 102/71 and his pulse is 82. His respiration is 16 and oxygen saturation is 100%. . Weight essentially stable.  No significant changes.  Impression The patient is tolerating radiation.  Plan Continue treatment as planned.         Sheral Apley Tammi Klippel, M.D. This document serves as a record of services personally performed by Tyler Pita, MD. It was created on his behalf by Maryla Morrow, a trained medical scribe. The creation of this record is based on the scribe's personal observations and the provider's statements to them. This document has been checked and approved by the attending provider.

## 2016-05-05 NOTE — Progress Notes (Signed)
Weight and vitals stable. Denies pain. Reports nocturia x 3. Denies dysuria or hematuria. Describes his urine stream as intermittent with occasional difficulty emptying his bladder. Denies urgency. Reports occasional leakage. Reports on occasion he has constipation which he manages with milk of magnesia or a laxative. Denies fatigue.   BP 102/71 (BP Location: Right Arm, Patient Position: Sitting, Cuff Size: Normal)   Pulse 82   Resp 16   Wt 164 lb (74.4 kg)   SpO2 100%   BMI 26.47 kg/m  Wt Readings from Last 3 Encounters:  05/05/16 164 lb (74.4 kg)  04/24/16 163 lb 3.2 oz (74 kg)  02/21/16 167 lb 11.2 oz (76.1 kg)

## 2016-05-08 ENCOUNTER — Ambulatory Visit
Admission: RE | Admit: 2016-05-08 | Discharge: 2016-05-08 | Disposition: A | Discharge status: Home / Self Care | Payer: Medicare Other | Source: Ambulatory Visit | Attending: Radiation Oncology | Admitting: Radiation Oncology

## 2016-05-08 ENCOUNTER — Encounter: Payer: Medicare Other | Admitting: Internal Medicine

## 2016-05-08 DIAGNOSIS — Z794 Long term (current) use of insulin: Secondary | ICD-10-CM | POA: Diagnosis not present

## 2016-05-08 DIAGNOSIS — Z510 Encounter for antineoplastic radiation therapy: Secondary | ICD-10-CM | POA: Diagnosis not present

## 2016-05-08 DIAGNOSIS — C61 Malignant neoplasm of prostate: Secondary | ICD-10-CM | POA: Diagnosis not present

## 2016-05-08 DIAGNOSIS — N183 Chronic kidney disease, stage 3 (moderate): Secondary | ICD-10-CM | POA: Diagnosis not present

## 2016-05-08 DIAGNOSIS — E1122 Type 2 diabetes mellitus with diabetic chronic kidney disease: Secondary | ICD-10-CM | POA: Diagnosis not present

## 2016-05-08 DIAGNOSIS — I129 Hypertensive chronic kidney disease with stage 1 through stage 4 chronic kidney disease, or unspecified chronic kidney disease: Secondary | ICD-10-CM | POA: Diagnosis not present

## 2016-05-09 ENCOUNTER — Ambulatory Visit
Admission: RE | Admit: 2016-05-09 | Discharge: 2016-05-09 | Disposition: A | Discharge status: Home / Self Care | Payer: Medicare Other | Source: Ambulatory Visit | Attending: Radiation Oncology | Admitting: Radiation Oncology

## 2016-05-09 ENCOUNTER — Encounter: Payer: Self-pay | Admitting: Radiation Oncology

## 2016-05-09 VITALS — BP 127/82 | HR 86 | Temp 98.2°F | Resp 18 | Ht 66.0 in | Wt 166.8 lb

## 2016-05-09 DIAGNOSIS — C61 Malignant neoplasm of prostate: Secondary | ICD-10-CM

## 2016-05-09 DIAGNOSIS — Z794 Long term (current) use of insulin: Secondary | ICD-10-CM | POA: Diagnosis not present

## 2016-05-09 DIAGNOSIS — E1122 Type 2 diabetes mellitus with diabetic chronic kidney disease: Secondary | ICD-10-CM | POA: Diagnosis not present

## 2016-05-09 DIAGNOSIS — I129 Hypertensive chronic kidney disease with stage 1 through stage 4 chronic kidney disease, or unspecified chronic kidney disease: Secondary | ICD-10-CM | POA: Diagnosis not present

## 2016-05-09 DIAGNOSIS — Z510 Encounter for antineoplastic radiation therapy: Secondary | ICD-10-CM | POA: Diagnosis not present

## 2016-05-09 DIAGNOSIS — N183 Chronic kidney disease, stage 3 (moderate): Secondary | ICD-10-CM | POA: Diagnosis not present

## 2016-05-09 NOTE — Addendum Note (Signed)
Encounter addended by: Tyler Pita, MD on: 05/09/2016  5:23 PM<BR>    Actions taken: Sign clinical note

## 2016-05-09 NOTE — Progress Notes (Addendum)
  Radiation Oncology         [phone]   Name: Robert Sparks MRN: [id number]   Date: 05/09/2016  DOB: 06-22-1946   Weekly Radiation Therapy Management    ICD-9-CM ICD-10-CM   1. Malignant neoplasm of prostate ([facility]) 185 C61     Current Dose: 10.8 Gy  Planned Dose:  75 Gy  Narrative The patient presents for routine under treatment assessment. Weight and vitals stable. Denies pain. Reports nocturia x 3. Denies dysuria or hematuria. Describes his urine stream as intermittent with occasional difficulty emptying his bladder. Denies urgency. Reports occasional leakage. Reports on occasion he has constipation which he manages with milk of magnesia or a laxative improved. Denies fatigue.  The patient is without complaint. Set-up films were reviewed. The chart was checked.  Physical Findings  height is 5\' 6"  (1.676 m) and weight is 166 lb 12.8 oz (75.7 kg). His oral temperature is 98.2 F (36.8 C). His blood pressure is 127/82 and his pulse is 86. His respiration is 18 and oxygen saturation is 100%. . Weight essentially stable.  No significant changes.  Impression The patient is tolerating radiation.  Plan Continue treatment as planned.         Sheral Apley Tammi Klippel, M.D.

## 2016-05-09 NOTE — Progress Notes (Signed)
Weight and vitals stable. Denies pain. Reports nocturia x 3. Denies dysuria or hematuria. Describes his urine stream as intermittent with occasional difficulty emptying his bladder. Denies urgency. Reports occasional leakage. Reports on occasion he has constipation which he manages with milk of magnesia or a laxative improved. Denies fatigue. Wt Readings from Last 3 Encounters:  05/09/16 166 lb 12.8 oz (75.7 kg)  05/05/16 164 lb (74.4 kg)  04/24/16 163 lb 3.2 oz (74 kg)  BP 127/82   Pulse 86   Temp 98.2 F (36.8 C) (Oral)   Resp 18   Ht 5\' 6"  (1.676 m)   Wt 166 lb 12.8 oz (75.7 kg)   SpO2 100%   BMI 26.92 kg/m

## 2016-05-10 ENCOUNTER — Ambulatory Visit
Admission: RE | Admit: 2016-05-10 | Discharge: 2016-05-10 | Disposition: A | Discharge status: Home / Self Care | Payer: Medicare Other | Source: Ambulatory Visit | Attending: Radiation Oncology | Admitting: Radiation Oncology

## 2016-05-10 DIAGNOSIS — Z510 Encounter for antineoplastic radiation therapy: Secondary | ICD-10-CM | POA: Diagnosis not present

## 2016-05-10 DIAGNOSIS — N183 Chronic kidney disease, stage 3 (moderate): Secondary | ICD-10-CM | POA: Diagnosis not present

## 2016-05-10 DIAGNOSIS — E1122 Type 2 diabetes mellitus with diabetic chronic kidney disease: Secondary | ICD-10-CM | POA: Diagnosis not present

## 2016-05-10 DIAGNOSIS — Z794 Long term (current) use of insulin: Secondary | ICD-10-CM | POA: Diagnosis not present

## 2016-05-10 DIAGNOSIS — C61 Malignant neoplasm of prostate: Secondary | ICD-10-CM | POA: Diagnosis not present

## 2016-05-10 DIAGNOSIS — I129 Hypertensive chronic kidney disease with stage 1 through stage 4 chronic kidney disease, or unspecified chronic kidney disease: Secondary | ICD-10-CM | POA: Diagnosis not present

## 2016-05-11 ENCOUNTER — Ambulatory Visit
Admission: RE | Admit: 2016-05-11 | Discharge: 2016-05-11 | Disposition: A | Discharge status: Home / Self Care | Payer: Medicare Other | Source: Ambulatory Visit | Attending: Radiation Oncology | Admitting: Radiation Oncology

## 2016-05-11 DIAGNOSIS — C61 Malignant neoplasm of prostate: Secondary | ICD-10-CM | POA: Diagnosis not present

## 2016-05-11 DIAGNOSIS — I129 Hypertensive chronic kidney disease with stage 1 through stage 4 chronic kidney disease, or unspecified chronic kidney disease: Secondary | ICD-10-CM | POA: Diagnosis not present

## 2016-05-11 DIAGNOSIS — Z794 Long term (current) use of insulin: Secondary | ICD-10-CM | POA: Diagnosis not present

## 2016-05-11 DIAGNOSIS — Z510 Encounter for antineoplastic radiation therapy: Secondary | ICD-10-CM | POA: Diagnosis not present

## 2016-05-11 DIAGNOSIS — N183 Chronic kidney disease, stage 3 (moderate): Secondary | ICD-10-CM | POA: Diagnosis not present

## 2016-05-11 DIAGNOSIS — E1122 Type 2 diabetes mellitus with diabetic chronic kidney disease: Secondary | ICD-10-CM | POA: Diagnosis not present

## 2016-05-12 ENCOUNTER — Ambulatory Visit
Admission: RE | Admit: 2016-05-12 | Discharge: 2016-05-12 | Disposition: A | Discharge status: Home / Self Care | Payer: Medicare Other | Source: Ambulatory Visit | Attending: Radiation Oncology | Admitting: Radiation Oncology

## 2016-05-12 DIAGNOSIS — I129 Hypertensive chronic kidney disease with stage 1 through stage 4 chronic kidney disease, or unspecified chronic kidney disease: Secondary | ICD-10-CM | POA: Diagnosis not present

## 2016-05-12 DIAGNOSIS — Z510 Encounter for antineoplastic radiation therapy: Secondary | ICD-10-CM | POA: Diagnosis not present

## 2016-05-12 DIAGNOSIS — N183 Chronic kidney disease, stage 3 (moderate): Secondary | ICD-10-CM | POA: Diagnosis not present

## 2016-05-12 DIAGNOSIS — Z794 Long term (current) use of insulin: Secondary | ICD-10-CM | POA: Diagnosis not present

## 2016-05-12 DIAGNOSIS — E1122 Type 2 diabetes mellitus with diabetic chronic kidney disease: Secondary | ICD-10-CM | POA: Diagnosis not present

## 2016-05-12 DIAGNOSIS — C61 Malignant neoplasm of prostate: Secondary | ICD-10-CM | POA: Diagnosis not present

## 2016-05-15 ENCOUNTER — Ambulatory Visit
Admission: RE | Admit: 2016-05-15 | Discharge: 2016-05-15 | Disposition: A | Discharge status: Home / Self Care | Payer: Medicare Other | Source: Ambulatory Visit | Attending: Radiation Oncology | Admitting: Radiation Oncology

## 2016-05-15 ENCOUNTER — Telehealth: Payer: Self-pay | Admitting: Internal Medicine

## 2016-05-15 DIAGNOSIS — M21619 Bunion of unspecified foot: Secondary | ICD-10-CM

## 2016-05-15 DIAGNOSIS — C61 Malignant neoplasm of prostate: Secondary | ICD-10-CM | POA: Diagnosis not present

## 2016-05-15 DIAGNOSIS — Z794 Long term (current) use of insulin: Secondary | ICD-10-CM | POA: Diagnosis not present

## 2016-05-15 DIAGNOSIS — N183 Chronic kidney disease, stage 3 (moderate): Secondary | ICD-10-CM | POA: Diagnosis not present

## 2016-05-15 DIAGNOSIS — I129 Hypertensive chronic kidney disease with stage 1 through stage 4 chronic kidney disease, or unspecified chronic kidney disease: Secondary | ICD-10-CM | POA: Diagnosis not present

## 2016-05-15 DIAGNOSIS — Z510 Encounter for antineoplastic radiation therapy: Secondary | ICD-10-CM | POA: Diagnosis not present

## 2016-05-15 DIAGNOSIS — E1122 Type 2 diabetes mellitus with diabetic chronic kidney disease: Secondary | ICD-10-CM | POA: Diagnosis not present

## 2016-05-15 MED FILL — !NOVOLOG 70/30 FLEXPEN: 70-30 | 21 days supply | Qty: 3 | Fill #2

## 2016-05-15 MED FILL — TRUEplus LANCETS 28G MISC: 33 days supply | Qty: 100 | Fill #2

## 2016-05-15 MED FILL — TRUE METRIX TEST STRIP: 25 days supply | Qty: 100 | Fill #5

## 2016-05-15 MED FILL — LISINOPRIL-HCTZ 10-12.5 MG: 10-12.5 | 30 days supply | Qty: 30 | Fill #5

## 2016-05-15 MED FILL — ?GABAPENTIN 300 MG CAPSULE: 300 | 30 days supply | Qty: 60 | Fill #2

## 2016-05-15 MED FILL — AMLODIPINE BESYLATE 10 MG T: 10 | 30 days supply | Qty: 30 | Fill #5

## 2016-05-15 NOTE — Telephone Encounter (Signed)
Will forward to pcp

## 2016-05-15 NOTE — Telephone Encounter (Signed)
Patient came to the office to speak with PCP regarding a bunion that he on his right foot (big toe). Might need to see specialist. Please follow up.   Thank you

## 2016-05-16 ENCOUNTER — Encounter: Payer: Self-pay | Admitting: Medical Oncology

## 2016-05-16 ENCOUNTER — Ambulatory Visit
Admission: RE | Admit: 2016-05-16 | Discharge: 2016-05-16 | Disposition: A | Discharge status: Home / Self Care | Payer: Medicare Other | Source: Ambulatory Visit | Attending: Radiation Oncology | Admitting: Radiation Oncology

## 2016-05-16 DIAGNOSIS — Z794 Long term (current) use of insulin: Secondary | ICD-10-CM | POA: Diagnosis not present

## 2016-05-16 DIAGNOSIS — I129 Hypertensive chronic kidney disease with stage 1 through stage 4 chronic kidney disease, or unspecified chronic kidney disease: Secondary | ICD-10-CM | POA: Diagnosis not present

## 2016-05-16 DIAGNOSIS — C61 Malignant neoplasm of prostate: Secondary | ICD-10-CM | POA: Diagnosis not present

## 2016-05-16 DIAGNOSIS — E1122 Type 2 diabetes mellitus with diabetic chronic kidney disease: Secondary | ICD-10-CM | POA: Diagnosis not present

## 2016-05-16 DIAGNOSIS — N183 Chronic kidney disease, stage 3 (moderate): Secondary | ICD-10-CM | POA: Diagnosis not present

## 2016-05-16 DIAGNOSIS — Z510 Encounter for antineoplastic radiation therapy: Secondary | ICD-10-CM | POA: Diagnosis not present

## 2016-05-16 NOTE — Telephone Encounter (Signed)
Pt is aware of referral pt states he has medicare.

## 2016-05-16 NOTE — Progress Notes (Signed)
Mr. Moak states he is doing well except his has some diarrhea. We discussed side effects of treatment and the use of imodium. He voiced understanding.

## 2016-05-16 NOTE — Telephone Encounter (Signed)
I put in referral for podiatry, not certain if has orange card/cone discount though. Please check. thanks

## 2016-05-17 ENCOUNTER — Ambulatory Visit
Admission: RE | Admit: 2016-05-17 | Discharge: 2016-05-17 | Disposition: A | Discharge status: Home / Self Care | Payer: Medicare Other | Source: Ambulatory Visit | Attending: Radiation Oncology | Admitting: Radiation Oncology

## 2016-05-17 DIAGNOSIS — Z510 Encounter for antineoplastic radiation therapy: Secondary | ICD-10-CM | POA: Diagnosis not present

## 2016-05-17 DIAGNOSIS — C61 Malignant neoplasm of prostate: Secondary | ICD-10-CM | POA: Diagnosis not present

## 2016-05-17 DIAGNOSIS — E1122 Type 2 diabetes mellitus with diabetic chronic kidney disease: Secondary | ICD-10-CM | POA: Diagnosis not present

## 2016-05-17 DIAGNOSIS — Z794 Long term (current) use of insulin: Secondary | ICD-10-CM | POA: Diagnosis not present

## 2016-05-17 DIAGNOSIS — I129 Hypertensive chronic kidney disease with stage 1 through stage 4 chronic kidney disease, or unspecified chronic kidney disease: Secondary | ICD-10-CM | POA: Diagnosis not present

## 2016-05-17 DIAGNOSIS — N183 Chronic kidney disease, stage 3 (moderate): Secondary | ICD-10-CM | POA: Diagnosis not present

## 2016-05-18 ENCOUNTER — Ambulatory Visit: Payer: Medicare Other | Admitting: Nutrition

## 2016-05-18 ENCOUNTER — Ambulatory Visit
Admission: RE | Admit: 2016-05-18 | Discharge: 2016-05-18 | Disposition: A | Discharge status: Home / Self Care | Payer: Medicare Other | Source: Ambulatory Visit | Attending: Radiation Oncology | Admitting: Radiation Oncology

## 2016-05-18 DIAGNOSIS — N183 Chronic kidney disease, stage 3 (moderate): Secondary | ICD-10-CM | POA: Diagnosis not present

## 2016-05-18 DIAGNOSIS — Z510 Encounter for antineoplastic radiation therapy: Secondary | ICD-10-CM | POA: Diagnosis not present

## 2016-05-18 DIAGNOSIS — E1122 Type 2 diabetes mellitus with diabetic chronic kidney disease: Secondary | ICD-10-CM | POA: Diagnosis not present

## 2016-05-18 DIAGNOSIS — Z794 Long term (current) use of insulin: Secondary | ICD-10-CM | POA: Diagnosis not present

## 2016-05-18 DIAGNOSIS — C61 Malignant neoplasm of prostate: Secondary | ICD-10-CM | POA: Diagnosis not present

## 2016-05-18 DIAGNOSIS — I129 Hypertensive chronic kidney disease with stage 1 through stage 4 chronic kidney disease, or unspecified chronic kidney disease: Secondary | ICD-10-CM | POA: Diagnosis not present

## 2016-05-18 NOTE — Progress Notes (Signed)
70 year old male diagnosed with prostate cancer.  He is a patient of Dr. Tammi Klippel.  Past medical history includes diabetes type 2, peripheral neuropathy, hypertension, sepsis, GERD, and chronic kidney disease stage III.  Medications include NovoLog.  Labs were reviewed.  Height: 5 feet 6 inches. Weight: 160.2 pounds. Usual body weight: 160 pounds in April 2017. BMI: 25.86.  Patient denies nutrition impact symptoms at this time. He reports he has a good appetite and has not lost weight. He reports he is beginning to get a little diarrhea but it is "not bad." Patient is able to tell me how to take his medications doctors prescribed for diarrhea. Patient also able to tell me how he avoids concentrated sweets because of his blood sugars.  Nutrition diagnosis:  Food and nutrition related knowledge deficit related to prostate cancer and associated treatments as evidenced by no prior need for nutrition related information.  Intervention: Educated patient to consume small frequent meals and snacks with adequate calories and protein to minimize weight loss. Enforced importance of following a no concentrated sweets diet so as not to increased blood sugars. Educated patient on low fiber diet in case diarrhea worsens.  I provided him with a fact sheet. Questions were answered.  Teach back method used.  Contact information was given.  Monitoring, evaluation, goals:  Patient will tolerate adequate calories and protein to minimize weight loss.  Next visit: Patient to contact me by phone for questions or concerns.  **Disclaimer: This note was dictated with voice recognition software. Similar sounding words can inadvertently be transcribed and this note may contain transcription errors which may not have been corrected upon publication of note.**

## 2016-05-19 ENCOUNTER — Ambulatory Visit
Admission: RE | Admit: 2016-05-19 | Discharge: 2016-05-19 | Disposition: A | Discharge status: Home / Self Care | Payer: Medicare Other | Source: Ambulatory Visit | Attending: Radiation Oncology | Admitting: Radiation Oncology

## 2016-05-19 VITALS — BP 113/68 | HR 88 | Resp 16 | Wt 159.6 lb

## 2016-05-19 DIAGNOSIS — Z510 Encounter for antineoplastic radiation therapy: Secondary | ICD-10-CM | POA: Diagnosis not present

## 2016-05-19 DIAGNOSIS — I129 Hypertensive chronic kidney disease with stage 1 through stage 4 chronic kidney disease, or unspecified chronic kidney disease: Secondary | ICD-10-CM | POA: Diagnosis not present

## 2016-05-19 DIAGNOSIS — E1122 Type 2 diabetes mellitus with diabetic chronic kidney disease: Secondary | ICD-10-CM | POA: Diagnosis not present

## 2016-05-19 DIAGNOSIS — N183 Chronic kidney disease, stage 3 (moderate): Secondary | ICD-10-CM | POA: Diagnosis not present

## 2016-05-19 DIAGNOSIS — C61 Malignant neoplasm of prostate: Secondary | ICD-10-CM

## 2016-05-19 DIAGNOSIS — Z794 Long term (current) use of insulin: Secondary | ICD-10-CM | POA: Diagnosis not present

## 2016-05-19 NOTE — Progress Notes (Signed)
Weight and vitals stable. Reports right shoulder pain related to effects of arthritis. Reports nocturia x 3. Denies dysuria or hematuria. Reports intermittent urine stream. Denies difficulty emptying his bladder. Reports urgency with occasional leakage. Denies any bowel complaints. Denies fatigue.   BP 113/68 (BP Location: Left Arm, Patient Position: Sitting, Cuff Size: Normal)   Pulse 88   Resp 16   Wt 159 lb 9.6 oz (72.4 kg)   SpO2 100%   BMI 25.76 kg/m  Wt Readings from Last 3 Encounters:  05/19/16 159 lb 9.6 oz (72.4 kg)  05/18/16 160 lb 3.2 oz (72.7 kg)  05/09/16 166 lb 12.8 oz (75.7 kg)

## 2016-05-19 NOTE — Progress Notes (Signed)
  Radiation Oncology         [phone]   Name: Robert Sparks MRN: [id number]   Date: 05/19/2016  DOB: [birth year]   Weekly Radiation Therapy Management    ICD-9-CM ICD-10-CM   1. Malignant neoplasm of prostate ([place]) 185 C61     Current Dose: 25.2 Gy  Planned Dose:  75 Gy  Narrative The patient presents for routine under treatment assessment.  Reports right shoulder pain related to arthritis. Denies dysuria, hematuria, difficulty emptying his bladder, fatigue, or bowel complaints. Reports intermittent urine stream, urgency with occasional leakage, and nocturia x3.  Set-up films were reviewed. The chart was checked.  Physical Findings  weight is 159 lb 9.6 oz (72.4 kg). His blood pressure is 113/68 and his pulse is 88. His respiration is 16 and oxygen saturation is 100%. . Weight essentially stable.  No significant changes.  Impression The patient is tolerating radiation.  Plan Continue treatment as planned.         Sheral Apley Tammi Klippel, M.D.  This document serves as a record of services personally performed by Tyler Pita, MD. It was created on his behalf by Darcus Austin, a trained medical scribe. The creation of this record is based on the scribe's personal observations and the provider's statements to them. This document has been checked and approved by the attending provider.

## 2016-05-22 ENCOUNTER — Ambulatory Visit
Admission: RE | Admit: 2016-05-22 | Discharge: 2016-05-22 | Disposition: A | Discharge status: Home / Self Care | Payer: Medicare Other | Source: Ambulatory Visit | Attending: Radiation Oncology | Admitting: Radiation Oncology

## 2016-05-22 DIAGNOSIS — R3915 Urgency of urination: Secondary | ICD-10-CM | POA: Insufficient documentation

## 2016-05-22 DIAGNOSIS — Z510 Encounter for antineoplastic radiation therapy: Secondary | ICD-10-CM | POA: Diagnosis not present

## 2016-05-22 DIAGNOSIS — R351 Nocturia: Secondary | ICD-10-CM | POA: Diagnosis not present

## 2016-05-22 DIAGNOSIS — Z6825 Body mass index (BMI) 25.0-25.9, adult: Secondary | ICD-10-CM | POA: Diagnosis not present

## 2016-05-22 DIAGNOSIS — R634 Abnormal weight loss: Secondary | ICD-10-CM | POA: Diagnosis not present

## 2016-05-22 DIAGNOSIS — C61 Malignant neoplasm of prostate: Secondary | ICD-10-CM | POA: Diagnosis not present

## 2016-05-23 ENCOUNTER — Ambulatory Visit
Admission: RE | Admit: 2016-05-23 | Discharge: 2016-05-23 | Disposition: A | Discharge status: Home / Self Care | Payer: Medicare Other | Source: Ambulatory Visit | Attending: Radiation Oncology | Admitting: Radiation Oncology

## 2016-05-23 DIAGNOSIS — Z6825 Body mass index (BMI) 25.0-25.9, adult: Secondary | ICD-10-CM | POA: Diagnosis not present

## 2016-05-23 DIAGNOSIS — R351 Nocturia: Secondary | ICD-10-CM | POA: Diagnosis not present

## 2016-05-23 DIAGNOSIS — Z510 Encounter for antineoplastic radiation therapy: Secondary | ICD-10-CM | POA: Diagnosis not present

## 2016-05-23 DIAGNOSIS — C61 Malignant neoplasm of prostate: Secondary | ICD-10-CM | POA: Diagnosis not present

## 2016-05-23 DIAGNOSIS — R634 Abnormal weight loss: Secondary | ICD-10-CM | POA: Diagnosis not present

## 2016-05-23 DIAGNOSIS — R3915 Urgency of urination: Secondary | ICD-10-CM | POA: Diagnosis not present

## 2016-05-24 ENCOUNTER — Ambulatory Visit
Admission: RE | Admit: 2016-05-24 | Discharge: 2016-05-24 | Disposition: A | Discharge status: Home / Self Care | Payer: Medicare Other | Source: Ambulatory Visit | Attending: Radiation Oncology | Admitting: Radiation Oncology

## 2016-05-24 DIAGNOSIS — R351 Nocturia: Secondary | ICD-10-CM | POA: Diagnosis not present

## 2016-05-24 DIAGNOSIS — Z6825 Body mass index (BMI) 25.0-25.9, adult: Secondary | ICD-10-CM | POA: Diagnosis not present

## 2016-05-24 DIAGNOSIS — C61 Malignant neoplasm of prostate: Secondary | ICD-10-CM | POA: Diagnosis not present

## 2016-05-24 DIAGNOSIS — Z510 Encounter for antineoplastic radiation therapy: Secondary | ICD-10-CM | POA: Diagnosis not present

## 2016-05-24 DIAGNOSIS — R3915 Urgency of urination: Secondary | ICD-10-CM | POA: Diagnosis not present

## 2016-05-24 DIAGNOSIS — R634 Abnormal weight loss: Secondary | ICD-10-CM | POA: Diagnosis not present

## 2016-05-25 ENCOUNTER — Ambulatory Visit
Admission: RE | Admit: 2016-05-25 | Discharge: 2016-05-25 | Disposition: A | Discharge status: Home / Self Care | Payer: Medicare Other | Source: Ambulatory Visit | Attending: Radiation Oncology | Admitting: Radiation Oncology

## 2016-05-25 VITALS — BP 101/53 | HR 89 | Resp 16 | Wt 160.2 lb

## 2016-05-25 DIAGNOSIS — Z510 Encounter for antineoplastic radiation therapy: Secondary | ICD-10-CM | POA: Diagnosis not present

## 2016-05-25 DIAGNOSIS — R351 Nocturia: Secondary | ICD-10-CM | POA: Diagnosis not present

## 2016-05-25 DIAGNOSIS — R3915 Urgency of urination: Secondary | ICD-10-CM | POA: Diagnosis not present

## 2016-05-25 DIAGNOSIS — R634 Abnormal weight loss: Secondary | ICD-10-CM | POA: Diagnosis not present

## 2016-05-25 DIAGNOSIS — Z6825 Body mass index (BMI) 25.0-25.9, adult: Secondary | ICD-10-CM | POA: Diagnosis not present

## 2016-05-25 DIAGNOSIS — C61 Malignant neoplasm of prostate: Secondary | ICD-10-CM

## 2016-05-25 NOTE — Progress Notes (Signed)
Weight and vitals stable. Reports right shoulder pain related to effects of arthritis. Reports right shoulder pain continue but, now is affecting his ROM. Reports nocturia x 3. Denies dysuria or hematuria. Reports intermittent urine stream that seem slightly weaker. Denies difficulty emptying his bladder. Reports urgency with occasional leakage. Reports a few episodes of diarrhea this week. Denies fatigue. Reports intermittent episodes of dizziness that presented before radiation began. Reports he has fallen as a result of dizziness but, confirms last fall was approximately one month ago.   BP (!) 101/53 (BP Location: Left Arm, Patient Position: Sitting, Cuff Size: Normal)   Pulse 89   Resp 16   Wt 160 lb 3.2 oz (72.7 kg)   SpO2 100%   BMI 25.86 kg/m  Wt Readings from Last 3 Encounters:  05/25/16 160 lb 3.2 oz (72.7 kg)  05/19/16 159 lb 9.6 oz (72.4 kg)  05/18/16 160 lb 3.2 oz (72.7 kg)

## 2016-05-25 NOTE — Progress Notes (Signed)
  Radiation Oncology         [phone]   Name: Robert Sparks MRN: [id number]   Date: 05/25/2016  DOB: July 18, 1945   Weekly Radiation Therapy Management    ICD-9-CM ICD-10-CM   1. Malignant neoplasm of prostate ([facility]) 185 C61     Current Dose: 32.4 Gy  Planned Dose:  75 Gy  Narrative The patient presents for routine under treatment assessment.  Reports right shoulder pain related to the effects of arthritis and it is affecting his ROM. Reports nocturia x 3. Denies dysuria, hematuria, difficulty emptying his bladder, or fatigue. Reports intermittent urine stream that seem slightly weaker, urgency with occasional leakage, a few episodes of diarrhea this week. He also reports intermittent episodes of dizziness that presented before radiation began. He states that he has fallen as a result of the dizziness, but confirms his last fall was approximately one month ago. The patient also reports a low sex drive.  Set-up films were reviewed. The chart was checked.  Physical Findings  weight is 160 lb 3.2 oz (72.7 kg). His blood pressure is 101/53 (abnormal) and his pulse is 89. His respiration is 16 and oxygen saturation is 100%. . Weight essentially stable.  No significant changes.  Impression The patient is tolerating radiation.  Plan Continue treatment as planned. I advised the patient to speak with his urologist regarding his low libido.         Sheral Apley Tammi Klippel, M.D.  This document serves as a record of services personally performed by Tyler Pita, MD. It was created on his behalf by Darcus Austin, a trained medical scribe. The creation of this record is based on the scribe's personal observations and the provider's statements to them. This document has been checked and approved by the attending provider.

## 2016-05-26 ENCOUNTER — Ambulatory Visit
Admission: RE | Admit: 2016-05-26 | Discharge: 2016-05-26 | Disposition: A | Discharge status: Home / Self Care | Payer: Medicare Other | Source: Ambulatory Visit | Attending: Radiation Oncology | Admitting: Radiation Oncology

## 2016-05-26 ENCOUNTER — Telehealth: Payer: Self-pay | Admitting: Internal Medicine

## 2016-05-26 DIAGNOSIS — C61 Malignant neoplasm of prostate: Secondary | ICD-10-CM | POA: Diagnosis not present

## 2016-05-26 DIAGNOSIS — R634 Abnormal weight loss: Secondary | ICD-10-CM | POA: Diagnosis not present

## 2016-05-26 DIAGNOSIS — R351 Nocturia: Secondary | ICD-10-CM | POA: Diagnosis not present

## 2016-05-26 DIAGNOSIS — Z6825 Body mass index (BMI) 25.0-25.9, adult: Secondary | ICD-10-CM | POA: Diagnosis not present

## 2016-05-26 DIAGNOSIS — Z510 Encounter for antineoplastic radiation therapy: Secondary | ICD-10-CM | POA: Diagnosis not present

## 2016-05-26 DIAGNOSIS — R3915 Urgency of urination: Secondary | ICD-10-CM | POA: Diagnosis not present

## 2016-05-26 NOTE — Telephone Encounter (Signed)
No answer/busy. -Mesha Guinyard

## 2016-05-28 ENCOUNTER — Ambulatory Visit: Payer: Medicare Other

## 2016-05-29 ENCOUNTER — Ambulatory Visit
Admission: RE | Admit: 2016-05-29 | Discharge: 2016-05-29 | Disposition: A | Discharge status: Home / Self Care | Payer: Medicare Other | Source: Ambulatory Visit | Attending: Radiation Oncology | Admitting: Radiation Oncology

## 2016-05-29 DIAGNOSIS — Z510 Encounter for antineoplastic radiation therapy: Secondary | ICD-10-CM | POA: Diagnosis not present

## 2016-05-29 DIAGNOSIS — R634 Abnormal weight loss: Secondary | ICD-10-CM | POA: Diagnosis not present

## 2016-05-29 DIAGNOSIS — C61 Malignant neoplasm of prostate: Secondary | ICD-10-CM | POA: Diagnosis not present

## 2016-05-29 DIAGNOSIS — R351 Nocturia: Secondary | ICD-10-CM | POA: Diagnosis not present

## 2016-05-29 DIAGNOSIS — R3915 Urgency of urination: Secondary | ICD-10-CM | POA: Diagnosis not present

## 2016-05-29 DIAGNOSIS — Z6825 Body mass index (BMI) 25.0-25.9, adult: Secondary | ICD-10-CM | POA: Diagnosis not present

## 2016-05-30 ENCOUNTER — Ambulatory Visit
Admission: RE | Admit: 2016-05-30 | Discharge: 2016-05-30 | Disposition: A | Discharge status: Home / Self Care | Payer: Medicare Other | Source: Ambulatory Visit | Attending: Radiation Oncology | Admitting: Radiation Oncology

## 2016-05-30 DIAGNOSIS — R351 Nocturia: Secondary | ICD-10-CM | POA: Diagnosis not present

## 2016-05-30 DIAGNOSIS — R634 Abnormal weight loss: Secondary | ICD-10-CM | POA: Diagnosis not present

## 2016-05-30 DIAGNOSIS — C61 Malignant neoplasm of prostate: Secondary | ICD-10-CM | POA: Diagnosis not present

## 2016-05-30 DIAGNOSIS — Z6825 Body mass index (BMI) 25.0-25.9, adult: Secondary | ICD-10-CM | POA: Diagnosis not present

## 2016-05-30 DIAGNOSIS — Z510 Encounter for antineoplastic radiation therapy: Secondary | ICD-10-CM | POA: Diagnosis not present

## 2016-05-30 DIAGNOSIS — R3915 Urgency of urination: Secondary | ICD-10-CM | POA: Diagnosis not present

## 2016-05-31 ENCOUNTER — Encounter: Payer: Self-pay | Admitting: Podiatry

## 2016-05-31 ENCOUNTER — Ambulatory Visit
Admission: RE | Admit: 2016-05-31 | Discharge: 2016-05-31 | Disposition: A | Discharge status: Home / Self Care | Payer: Medicare Other | Source: Ambulatory Visit | Attending: Radiation Oncology | Admitting: Radiation Oncology

## 2016-05-31 ENCOUNTER — Ambulatory Visit (INDEPENDENT_AMBULATORY_CARE_PROVIDER_SITE_OTHER): Payer: Medicare Other | Admitting: Podiatry

## 2016-05-31 VITALS — BP 103/60 | HR 87 | Resp 16

## 2016-05-31 VITALS — BP 109/75 | HR 98 | Resp 16 | Wt 156.4 lb

## 2016-05-31 DIAGNOSIS — M79672 Pain in left foot: Secondary | ICD-10-CM

## 2016-05-31 DIAGNOSIS — M778 Other enthesopathies, not elsewhere classified: Secondary | ICD-10-CM

## 2016-05-31 DIAGNOSIS — M79671 Pain in right foot: Secondary | ICD-10-CM | POA: Diagnosis not present

## 2016-05-31 DIAGNOSIS — L608 Other nail disorders: Secondary | ICD-10-CM

## 2016-05-31 DIAGNOSIS — Z510 Encounter for antineoplastic radiation therapy: Secondary | ICD-10-CM | POA: Diagnosis not present

## 2016-05-31 DIAGNOSIS — B351 Tinea unguium: Secondary | ICD-10-CM

## 2016-05-31 DIAGNOSIS — Q828 Other specified congenital malformations of skin: Secondary | ICD-10-CM | POA: Diagnosis not present

## 2016-05-31 DIAGNOSIS — M19071 Primary osteoarthritis, right ankle and foot: Secondary | ICD-10-CM

## 2016-05-31 DIAGNOSIS — M7750 Other enthesopathy of unspecified foot: Secondary | ICD-10-CM

## 2016-05-31 DIAGNOSIS — Z6825 Body mass index (BMI) 25.0-25.9, adult: Secondary | ICD-10-CM | POA: Diagnosis not present

## 2016-05-31 DIAGNOSIS — M19072 Primary osteoarthritis, left ankle and foot: Secondary | ICD-10-CM | POA: Diagnosis not present

## 2016-05-31 DIAGNOSIS — R351 Nocturia: Secondary | ICD-10-CM | POA: Diagnosis not present

## 2016-05-31 DIAGNOSIS — C61 Malignant neoplasm of prostate: Secondary | ICD-10-CM | POA: Diagnosis not present

## 2016-05-31 DIAGNOSIS — E0843 Diabetes mellitus due to underlying condition with diabetic autonomic (poly)neuropathy: Secondary | ICD-10-CM | POA: Diagnosis not present

## 2016-05-31 DIAGNOSIS — M79676 Pain in unspecified toe(s): Secondary | ICD-10-CM | POA: Diagnosis not present

## 2016-05-31 DIAGNOSIS — R3915 Urgency of urination: Secondary | ICD-10-CM | POA: Diagnosis not present

## 2016-05-31 DIAGNOSIS — M25571 Pain in right ankle and joints of right foot: Secondary | ICD-10-CM

## 2016-05-31 DIAGNOSIS — M25572 Pain in left ankle and joints of left foot: Secondary | ICD-10-CM

## 2016-05-31 DIAGNOSIS — L603 Nail dystrophy: Secondary | ICD-10-CM

## 2016-05-31 DIAGNOSIS — L84 Corns and callosities: Secondary | ICD-10-CM | POA: Diagnosis not present

## 2016-05-31 DIAGNOSIS — M79609 Pain in unspecified limb: Secondary | ICD-10-CM

## 2016-05-31 DIAGNOSIS — R634 Abnormal weight loss: Secondary | ICD-10-CM | POA: Diagnosis not present

## 2016-05-31 DIAGNOSIS — M779 Enthesopathy, unspecified: Secondary | ICD-10-CM

## 2016-05-31 MED ORDER — BETAMETHASONE SOD PHOS & ACET 6 (3-3) MG/ML IJ SUSP: 3.0000 mg | Freq: Once | INTRAMUSCULAR | Status: DC

## 2016-05-31 NOTE — Progress Notes (Signed)
Patient ID: Robert Sparks, male   DOB: January 02, 1946, 70 y.o.   MRN: [id number]   SUBJECTIVE Patient with a history of diabetes mellitus presents to office today complaining of elongated, thickened nails. Pain while ambulating in shoes. Patient is unable to trim their own nails.  Patient also has a complaint of pain and tenderness to the bilateral feet. He states that his feet are constantly achy and painful. Patient also presents with painful callus lesions to the bilateral feet on the weightbearing surfaces.  No Known Allergies  OBJECTIVE General Patient is awake, alert, and oriented x 3 and in no acute distress. Derm painful hyperkeratotic skin lesions noted to the plantar aspect of the right great toe as well as the left great toe. Skin is dry and supple bilateral. Negative open lesions or macerations. Remaining integument unremarkable. Nails are tender, long, thickened and dystrophic with subungual debris, consistent with onychomycosis, 1-5 bilateral. No signs of infection noted. Vasc  DP and PT pedal pulses palpable bilaterally. Temperature gradient within normal limits.  Neuro Epicritic and protective threshold sensation diminished bilaterally.  Musculoskeletal Exam pain on palpation to the midfoot, Lisfranc joint, across the bilateral feet. Pain also noted with dorsiflexion of the right great toe consistent with a tendinitis. No symptomatic pedal deformities noted bilateral. Muscular strength within normal limits.  ASSESSMENT 1. Diabetes Mellitus w/ peripheral neuropathy 2. Onychomycosis of nail due to dermatophyte bilateral 3. Pain in foot bilateral 4. Midfoot arthritis bilateral 5. Porokeratosis bilateral great toes 6. Tendinitis right great toe  PLAN OF CARE 1. Patient evaluated today. 2. Instructed to maintain good pedal hygiene and foot care. Stressed importance of controlling blood sugar.  3. Mechanical debridement of nails 1-5 bilaterally performed using a nail nipper. Filed  with dremel without incident.  4. Excisional debridement of porokeratotic lesions was performed to the bilateral great toes using a chisel blade without incident. 4. Injection of 0.5 mL Celestone Soluspan injected into the Lisfranc joint/mid foot arthritis of the bilateral feet. 5. Injection of 0.5 mL Celestone Soluspan injected into the flexor tendon sheath of the right great toe.  6. Return to clinic in 3 mos.    Edrick Kins, DPM

## 2016-05-31 NOTE — Progress Notes (Signed)
   Subjective:    Patient ID: Robert Sparks, male    DOB: 1945-12-11, 70 y.o.   MRN: [id number]  HPI    Review of Systems  Eyes: Positive for redness.  Musculoskeletal: Positive for arthralgias, back pain and gait problem.  Neurological: Positive for dizziness and headaches.  All other systems reviewed and are negative.      Objective:   Physical Exam        Assessment & Plan:

## 2016-05-31 NOTE — Progress Notes (Signed)
Vitals stable. 4 lb weight loss noted. Reports right shoulder continues to ache. Reports nocturia x 3. Denies dysuria or hematuria. Reports intermittent stream without difficulty emptying his bladder. Reports urgency with occasional leakage is unchanged. Reports one episode of diarrhea this week. Reports episodes of dizziness are less frequency.   BP 105/67 (BP Location: Left Arm, Patient Position: Sitting, Cuff Size: Normal)   Pulse 94   Resp 16   Wt 156 lb 6.4 oz (70.9 kg)   SpO2 100%   BMI 25.24 kg/m  Wt Readings from Last 3 Encounters:  05/31/16 156 lb 6.4 oz (70.9 kg)  05/25/16 160 lb 3.2 oz (72.7 kg)  05/19/16 159 lb 9.6 oz (72.4 kg)

## 2016-05-31 NOTE — Progress Notes (Signed)
  Radiation Oncology         [phone]   Name: Robert Sparks MRN: [id number]   Date: 05/31/2016  DOB: Aug 16, 1945   Weekly Radiation Therapy Management    ICD-9-CM ICD-10-CM   1. Malignant neoplasm of prostate ([facility]) 185 C61     Current Dose: 39.6 Gy  Planned Dose:  75 Gy  Narrative The patient presents for routine under treatment assessment.  Vitals stable. 4 lb weight loss noted. He has lost about 11 lbs since beginning treatment. Reports right shoulder continues to ache. Reports nocturia x 3. Denies dysuria or hematuria. Reports intermittent stream without difficulty emptying his bladder. Reports urgency with occasional leakage is unchanged. Reports one episode of diarrhea this week. Reports episodes of dizziness are less frequency.   Set-up films were reviewed. The chart was checked.  Physical Findings  weight is 156 lb 6.4 oz (70.9 kg). His blood pressure is 109/75 and his pulse is 98. His respiration is 16 and oxygen saturation is 100%. . Weight loss noted.  No significant changes.  Impression The patient is tolerating radiation.  Plan Continue treatment as planned.      Sheral Apley Tammi Klippel, M.D.  This document serves as a record of services personally performed by Tyler Pita, MD and Shona Simpson, PA-C. It was created on his behalf by Arlyce Harman, a trained medical scribe. The creation of this record is based on the scribe's personal observations and the provider's statements to them. This document has been checked and approved by the attending provider.

## 2016-06-02 ENCOUNTER — Ambulatory Visit: Payer: Medicare Other

## 2016-06-05 ENCOUNTER — Ambulatory Visit
Admission: RE | Admit: 2016-06-05 | Discharge: 2016-06-05 | Disposition: A | Discharge status: Home / Self Care | Payer: Medicare Other | Source: Ambulatory Visit | Attending: Radiation Oncology | Admitting: Radiation Oncology

## 2016-06-05 DIAGNOSIS — R3915 Urgency of urination: Secondary | ICD-10-CM | POA: Diagnosis not present

## 2016-06-05 DIAGNOSIS — C61 Malignant neoplasm of prostate: Secondary | ICD-10-CM | POA: Diagnosis not present

## 2016-06-05 DIAGNOSIS — R351 Nocturia: Secondary | ICD-10-CM | POA: Diagnosis not present

## 2016-06-05 DIAGNOSIS — Z6825 Body mass index (BMI) 25.0-25.9, adult: Secondary | ICD-10-CM | POA: Diagnosis not present

## 2016-06-05 DIAGNOSIS — R634 Abnormal weight loss: Secondary | ICD-10-CM | POA: Diagnosis not present

## 2016-06-05 DIAGNOSIS — Z510 Encounter for antineoplastic radiation therapy: Secondary | ICD-10-CM | POA: Diagnosis not present

## 2016-06-06 ENCOUNTER — Ambulatory Visit
Admission: RE | Admit: 2016-06-06 | Discharge: 2016-06-06 | Disposition: A | Discharge status: Home / Self Care | Payer: Medicare Other | Source: Ambulatory Visit | Attending: Radiation Oncology | Admitting: Radiation Oncology

## 2016-06-06 DIAGNOSIS — R351 Nocturia: Secondary | ICD-10-CM | POA: Diagnosis not present

## 2016-06-06 DIAGNOSIS — Z6825 Body mass index (BMI) 25.0-25.9, adult: Secondary | ICD-10-CM | POA: Diagnosis not present

## 2016-06-06 DIAGNOSIS — R634 Abnormal weight loss: Secondary | ICD-10-CM | POA: Diagnosis not present

## 2016-06-06 DIAGNOSIS — C61 Malignant neoplasm of prostate: Secondary | ICD-10-CM | POA: Diagnosis not present

## 2016-06-06 DIAGNOSIS — Z510 Encounter for antineoplastic radiation therapy: Secondary | ICD-10-CM | POA: Diagnosis not present

## 2016-06-06 DIAGNOSIS — R3915 Urgency of urination: Secondary | ICD-10-CM | POA: Diagnosis not present

## 2016-06-06 MED FILL — !NOVOLOG 70/30 FLEXPEN: 70-30 | 21 days supply | Qty: 3 | Fill #3

## 2016-06-06 MED FILL — TRUE METRIX TEST STRIP: 25 days supply | Qty: 100 | Fill #6

## 2016-06-07 ENCOUNTER — Ambulatory Visit: Payer: Medicare Other

## 2016-06-07 ENCOUNTER — Ambulatory Visit
Admission: RE | Admit: 2016-06-07 | Discharge: 2016-06-07 | Disposition: A | Discharge status: Home / Self Care | Payer: Medicare Other | Source: Ambulatory Visit | Attending: Radiation Oncology | Admitting: Radiation Oncology

## 2016-06-07 DIAGNOSIS — Z510 Encounter for antineoplastic radiation therapy: Secondary | ICD-10-CM | POA: Diagnosis not present

## 2016-06-07 DIAGNOSIS — R351 Nocturia: Secondary | ICD-10-CM | POA: Diagnosis not present

## 2016-06-07 DIAGNOSIS — R3915 Urgency of urination: Secondary | ICD-10-CM | POA: Diagnosis not present

## 2016-06-07 DIAGNOSIS — Z6825 Body mass index (BMI) 25.0-25.9, adult: Secondary | ICD-10-CM | POA: Diagnosis not present

## 2016-06-07 DIAGNOSIS — R634 Abnormal weight loss: Secondary | ICD-10-CM | POA: Diagnosis not present

## 2016-06-07 DIAGNOSIS — C61 Malignant neoplasm of prostate: Secondary | ICD-10-CM | POA: Diagnosis not present

## 2016-06-08 ENCOUNTER — Other Ambulatory Visit: Payer: Self-pay | Admitting: *Deleted

## 2016-06-08 ENCOUNTER — Ambulatory Visit (INDEPENDENT_AMBULATORY_CARE_PROVIDER_SITE_OTHER): Payer: Medicare Other | Admitting: Physician Assistant

## 2016-06-08 ENCOUNTER — Ambulatory Visit: Payer: Medicare Other

## 2016-06-08 ENCOUNTER — Encounter: Payer: Self-pay | Admitting: Physician Assistant

## 2016-06-08 ENCOUNTER — Ambulatory Visit
Admission: RE | Admit: 2016-06-08 | Discharge: 2016-06-08 | Disposition: A | Discharge status: Home / Self Care | Payer: Medicare Other | Source: Ambulatory Visit | Attending: Radiation Oncology | Admitting: Radiation Oncology

## 2016-06-08 VITALS — BP 104/70 | HR 70 | Ht 66.0 in | Wt 157.0 lb

## 2016-06-08 DIAGNOSIS — R3915 Urgency of urination: Secondary | ICD-10-CM | POA: Diagnosis not present

## 2016-06-08 DIAGNOSIS — R634 Abnormal weight loss: Secondary | ICD-10-CM | POA: Diagnosis not present

## 2016-06-08 DIAGNOSIS — C61 Malignant neoplasm of prostate: Secondary | ICD-10-CM

## 2016-06-08 DIAGNOSIS — Z1211 Encounter for screening for malignant neoplasm of colon: Secondary | ICD-10-CM

## 2016-06-08 DIAGNOSIS — Z510 Encounter for antineoplastic radiation therapy: Secondary | ICD-10-CM | POA: Diagnosis not present

## 2016-06-08 DIAGNOSIS — R351 Nocturia: Secondary | ICD-10-CM | POA: Diagnosis not present

## 2016-06-08 DIAGNOSIS — Z6825 Body mass index (BMI) 25.0-25.9, adult: Secondary | ICD-10-CM | POA: Diagnosis not present

## 2016-06-08 MED ORDER — POLYETHYLENE GLYCOL 3350 17 GM/SCOOP PO POWD
ORAL | 0 refills | Status: DC
Start: 2016-06-08 — End: 2016-08-01

## 2016-06-08 MED FILL — POLYETHYLENE GLYCOL 3350: 1 days supply | Qty: 255 | Fill #0

## 2016-06-08 NOTE — Progress Notes (Addendum)
Subjective:    Patient ID: Robert Sparks, male    DOB: 1946/02/21, 70 y.o.   MRN: [id number]  HPI Robert Sparks is a pleasant 70 year old African-American male new to GI today, referred by Robert Sparks.D./[facility] for screening colonoscopy. Patient was he initially sent as a direct schedule colon however was determined that he has been diagnosed with prostate cancer and is undergoing radiation treatments. He comes in today to discuss colonoscopy and timing. He was diagnosed with prostate cancer in the summer of 2017, he had surgery and is now undergoing radiation therapy. He says he has had 18 treatments so far and needs a total of 40. He has no current complaints of abdominal discomfort, no changes in his bowel habits though has occasional constipation no melena or hematochezia. His appetite has been fair, he thinks he may have lost a few pounds since starting radiation. He has not had any prior colon on [place]. Family history negative for colon cancer as far as he is aware. Other medical issues include chronic kidney disease, adult-onset diabetes mellitus/insulin-dependent with history of DKA and prior history of EtOH abuse.  Review of Systems Pertinent positive and negative review of systems were noted in the above HPI section.  All other review of systems was otherwise negative.  Outpatient Encounter Prescriptions as of 06/08/2016  Medication Sig  . amLODipine (NORVASC) 10 MG tablet Take 1 tablet (10 mg total) by mouth daily. (Patient taking differently: Take 10 mg by mouth every morning. )  . Blood Glucose Monitoring Suppl (TRUE METRIX GO GLUCOSE METER) w/Device KIT 1 each by Does not apply route every 8 (eight) hours as needed.  . gabapentin (NEURONTIN) 300 MG capsule TAKE 1 CAPSULE BY MOUTH 2 TIMES DAILY.  [name] glucose blood (TRUE METRIX BLOOD GLUCOSE TEST) test strip Use as instructed  . glucose blood test strip   . insulin aspart protamine - aspart (NOVOLOG 70/30 MIX)  (70-30) 100 UNIT/ML FlexPen Inject 0.07 mLs (7 Units total) into the skin 2 (two) times daily with a meal.  . Insulin Pen Needle (PEN NEEDLES 3/16") 31G X 5 MM MISC Use one needle twice a day as directed.  [name] lisinopril-hydrochlorothiazide (PRINZIDE,ZESTORETIC) 10-12.5 MG tablet Take 1 tablet by mouth daily. (Patient taking differently: Take 1 tablet by mouth every morning. )  . TRUEPLUS LANCETS 26G MISC 1 each by Does not apply route every 8 (eight) hours as needed.  [name] ULTICARE MICRO PEN NEEDLES 32G X 4 MM MISC   . polyethylene glycol powder (GLYCOLAX/MIRALAX) powder Take as directed for colonoscopy prep.   Facility-Administered Encounter Medications as of 06/08/2016  Medication  . betamethasone acetate-betamethasone sodium phosphate (CELESTONE) injection 3 mg   No Known Allergies Patient Active Problem List   Diagnosis Date Noted  . CKD stage 3 due to type 2 diabetes mellitus ([place]) 02/23/2016  . Malignant neoplasm of prostate ([place]) 02/21/2016  . DKA (diabetic ketoacidoses) ([place])   . Diabetic ketoacidosis without coma associated with other specified diabetes mellitus ([place])   . DKA (diabetic ketoacidosis) ([place]) 03/29/2015  . DKA, type 2, not at goal [facility]) 03/29/2015  . Type II or unspecified type diabetes mellitus without mention of complication, not stated as uncontrolled 07/29/2013  . HTN (hypertension) 07/28/2013  . Renal failure (ARF), acute on chronic ([facility]) 07/27/2013  . Anemia, chronic disease 07/26/2013  . Pancytopenia ([place]) 07/26/2013  . Hypoglycemia 07/25/2013  . Acute encephalopathy 07/25/2013  . Sepsis ([place]) 07/25/2013  . Hypothermia 07/25/2013  . ETOH abuse  07/25/2013  . Metabolic acidosis 78/24/2353  . ARF (acute renal failure) ([place]) 07/25/2013   Social History   Social History  . Marital status: Single    Spouse name: N/A  . Number of children: N/A  . Years of education: N/A   Occupational History  . Not on file.   Social History Main Topics  . Smoking  status: Current Every Day Smoker    Packs/day: 0.25    Years: 50.00    Types: Cigarettes  . Smokeless tobacco: Never Used     Comment: 1 ppwk  . Alcohol use No     Comment: "I been quit 3 months"  . Drug use: No  . Sexual activity: Not Currently   Other Topics Concern  . Not on file   Social History Narrative  . No narrative on file    [name] family history is not on file.      Objective:    Vitals:   06/08/16 0830  BP: 104/70  Pulse: 70    Physical Exam   well-developed older African-American male in no acute distress, pleasant blood pressure 104/70 pulse 70, BMI 25.3. HEENT; nontraumatic normocephalic EOMI PERRLA sclera anicteric, Cardiovascular; regular rate and rhythm with S1-S2 no murmur or gallop, Pulmonary ;clear bilaterally, Abdomen ;soft nontender nondistended bowel sounds are active there is no palpable mass or hepatosplenomegaly, Rectal ;exam not done, Ext;no clubbing cyanosis or edema skin warm and dry, Neuropsych; mood and affect appropriate       Assessment & Plan:   #26 70 year old African-American male referred for colon cancer screening, average risk and no prior colonoscopy #2 recently diagnosed prostate cancer status post surgery and currently undergoing radiation therapy. He has about a month left of radiation treatments. He has been tolerating these well with only complaint decrease in appetite and mild fatigue. #3 adult-onset diabetes mellitus insulin-dependent #4 chronic kidney disease #5 history of DKA #6 prior history of EtOH abuse  Plan; patient will be scheduled for colonoscopy with Dr. Hilarie Fredrickson. Procedure discussed in detail with the patient including risks and benefits and he is agreeable to proceed. We'll intentionally schedule his procedure in the latter part of January or early February to allow him time to complete radiation therapy and recuperate.   Robert Sparks S Robert Clementson PA-C 06/08/2016   Cc: Robert Reamer,  MD  Addendum: Reviewed and agree with initial management. Jerene Bears, MD

## 2016-06-08 NOTE — Patient Instructions (Addendum)
You have been scheduled for a colonoscopy. Please follow written instructions given to you at your visit today.  If you use inhalers (even only as needed), please bring them with you on the day of your procedure. Your physician has requested that you go to www.startemmi.com and enter the access code given to you at your visit today. This web site gives a general overview about your procedure. However, you should still follow specific instructions given to you by our office regarding your preparation for the procedure.  We sent a prescription to [facility] for the generic Miralax, 255 grams.

## 2016-06-09 ENCOUNTER — Ambulatory Visit
Admission: RE | Admit: 2016-06-09 | Discharge: 2016-06-09 | Disposition: A | Discharge status: Home / Self Care | Payer: Medicare Other | Source: Ambulatory Visit | Attending: Radiation Oncology | Admitting: Radiation Oncology

## 2016-06-09 ENCOUNTER — Ambulatory Visit: Payer: Medicare Other

## 2016-06-09 VITALS — BP 106/69 | HR 92 | Resp 18 | Wt 155.0 lb

## 2016-06-09 DIAGNOSIS — R3915 Urgency of urination: Secondary | ICD-10-CM | POA: Diagnosis not present

## 2016-06-09 DIAGNOSIS — C61 Malignant neoplasm of prostate: Secondary | ICD-10-CM

## 2016-06-09 DIAGNOSIS — Z510 Encounter for antineoplastic radiation therapy: Secondary | ICD-10-CM | POA: Diagnosis not present

## 2016-06-09 DIAGNOSIS — Z6825 Body mass index (BMI) 25.0-25.9, adult: Secondary | ICD-10-CM | POA: Diagnosis not present

## 2016-06-09 DIAGNOSIS — R634 Abnormal weight loss: Secondary | ICD-10-CM | POA: Diagnosis not present

## 2016-06-09 DIAGNOSIS — R351 Nocturia: Secondary | ICD-10-CM | POA: Diagnosis not present

## 2016-06-09 NOTE — Progress Notes (Signed)
  Radiation Oncology         [phone]   Name: Robert Sparks MRN: [id number]   Date: 06/09/2016  DOB: 1946/03/02   Weekly Radiation Therapy Management    ICD-9-CM ICD-10-CM   1. Malignant neoplasm of prostate ([name]) 185 C61     Current Dose: 39.6 Gy  Planned Dose:  75 Gy  Narrative The patient presents for routine under treatment assessment.  Reports right shoulder continues to ache, nocturia x 3, intermittent urine stream without difficulty emptying his bladder, urgency continues, and rare leakage. Reports dry skin on his legs and the nurse provided him with aquaphor samples to use. Denies diarrhea, dysuria, or hematuria. Reports mild fatigue. Reports occasional dizziness with standing. The nurse encouraged the patient to follow up with PCP about this and the patient verbalized understanding.  Set-up films were reviewed. The chart was checked.  Physical Findings  weight is 155 lb (70.3 kg). His blood pressure is 106/69 and his pulse is 92. His respiration is 18 and oxygen saturation is 100%. . Weight loss noted (11 lbs since 05/09/16).  No significant changes.  Impression The patient is tolerating radiation.  Plan Continue treatment as planned.      Sheral Apley Tammi Klippel, M.D.  This document serves as a record of services personally performed by Robert Pita, MD. It was created on his behalf by Darcus Austin, a trained medical scribe. The creation of this record is based on the scribe's personal observations and the provider's statements to them. This document has been checked and approved by the attending provider.

## 2016-06-09 NOTE — Progress Notes (Signed)
Weight and vitals stable. Reports right shoulder continues to ache. Reports nocturia x 3. Denies dysuria or hematuria. Reports intermittent urine stream without difficulty emptying his bladder. Reports urgency continues. Reports leakage is rare. Reports dry skin on his legs thus provided him with aquaphor samples. Denies diarrhea. Reports mild fatigue. Reports occasional dizziness with standing. Encouraged patient to follow up with PCP reference dizziness. Patient verbalized understanding.   BP 106/69 (BP Location: Left Arm, Patient Position: Sitting, Cuff Size: Normal)   Pulse 92   Resp 18   Wt 155 lb (70.3 kg)   SpO2 100%   BMI 25.02 kg/m  Wt Readings from Last 3 Encounters:  06/09/16 155 lb (70.3 kg)  06/08/16 157 lb (71.2 kg)  05/31/16 156 lb 6.4 oz (70.9 kg)

## 2016-06-12 ENCOUNTER — Ambulatory Visit
Admission: RE | Admit: 2016-06-12 | Discharge: 2016-06-12 | Disposition: A | Discharge status: Home / Self Care | Payer: Medicare Other | Source: Ambulatory Visit | Attending: Radiation Oncology | Admitting: Radiation Oncology

## 2016-06-12 DIAGNOSIS — C61 Malignant neoplasm of prostate: Secondary | ICD-10-CM | POA: Diagnosis not present

## 2016-06-12 DIAGNOSIS — R634 Abnormal weight loss: Secondary | ICD-10-CM | POA: Diagnosis not present

## 2016-06-12 DIAGNOSIS — R351 Nocturia: Secondary | ICD-10-CM | POA: Diagnosis not present

## 2016-06-12 DIAGNOSIS — Z6825 Body mass index (BMI) 25.0-25.9, adult: Secondary | ICD-10-CM | POA: Diagnosis not present

## 2016-06-12 DIAGNOSIS — Z510 Encounter for antineoplastic radiation therapy: Secondary | ICD-10-CM | POA: Diagnosis not present

## 2016-06-12 DIAGNOSIS — R3915 Urgency of urination: Secondary | ICD-10-CM | POA: Diagnosis not present

## 2016-06-13 ENCOUNTER — Ambulatory Visit
Admission: RE | Admit: 2016-06-13 | Discharge: 2016-06-13 | Disposition: A | Discharge status: Home / Self Care | Payer: Medicare Other | Source: Ambulatory Visit | Attending: Radiation Oncology | Admitting: Radiation Oncology

## 2016-06-13 DIAGNOSIS — Z6825 Body mass index (BMI) 25.0-25.9, adult: Secondary | ICD-10-CM | POA: Diagnosis not present

## 2016-06-13 DIAGNOSIS — C61 Malignant neoplasm of prostate: Secondary | ICD-10-CM | POA: Diagnosis not present

## 2016-06-13 DIAGNOSIS — Z510 Encounter for antineoplastic radiation therapy: Secondary | ICD-10-CM | POA: Diagnosis not present

## 2016-06-13 DIAGNOSIS — R351 Nocturia: Secondary | ICD-10-CM | POA: Diagnosis not present

## 2016-06-13 DIAGNOSIS — R634 Abnormal weight loss: Secondary | ICD-10-CM | POA: Diagnosis not present

## 2016-06-13 DIAGNOSIS — R3915 Urgency of urination: Secondary | ICD-10-CM | POA: Diagnosis not present

## 2016-06-14 ENCOUNTER — Ambulatory Visit
Admission: RE | Admit: 2016-06-14 | Discharge: 2016-06-14 | Disposition: A | Discharge status: Home / Self Care | Payer: Medicare Other | Source: Ambulatory Visit | Attending: Radiation Oncology | Admitting: Radiation Oncology

## 2016-06-14 DIAGNOSIS — R634 Abnormal weight loss: Secondary | ICD-10-CM | POA: Diagnosis not present

## 2016-06-14 DIAGNOSIS — R351 Nocturia: Secondary | ICD-10-CM | POA: Diagnosis not present

## 2016-06-14 DIAGNOSIS — C61 Malignant neoplasm of prostate: Secondary | ICD-10-CM | POA: Diagnosis not present

## 2016-06-14 DIAGNOSIS — Z510 Encounter for antineoplastic radiation therapy: Secondary | ICD-10-CM | POA: Diagnosis not present

## 2016-06-14 DIAGNOSIS — R3915 Urgency of urination: Secondary | ICD-10-CM | POA: Diagnosis not present

## 2016-06-14 DIAGNOSIS — Z6825 Body mass index (BMI) 25.0-25.9, adult: Secondary | ICD-10-CM | POA: Diagnosis not present

## 2016-06-15 ENCOUNTER — Ambulatory Visit
Admission: RE | Admit: 2016-06-15 | Discharge: 2016-06-15 | Disposition: A | Discharge status: Home / Self Care | Payer: Medicare Other | Source: Ambulatory Visit | Attending: Radiation Oncology | Admitting: Radiation Oncology

## 2016-06-15 DIAGNOSIS — Z510 Encounter for antineoplastic radiation therapy: Secondary | ICD-10-CM | POA: Diagnosis not present

## 2016-06-15 DIAGNOSIS — R634 Abnormal weight loss: Secondary | ICD-10-CM | POA: Diagnosis not present

## 2016-06-15 DIAGNOSIS — R351 Nocturia: Secondary | ICD-10-CM | POA: Diagnosis not present

## 2016-06-15 DIAGNOSIS — C61 Malignant neoplasm of prostate: Secondary | ICD-10-CM | POA: Diagnosis not present

## 2016-06-15 DIAGNOSIS — Z6825 Body mass index (BMI) 25.0-25.9, adult: Secondary | ICD-10-CM | POA: Diagnosis not present

## 2016-06-15 DIAGNOSIS — R3915 Urgency of urination: Secondary | ICD-10-CM | POA: Diagnosis not present

## 2016-06-16 ENCOUNTER — Ambulatory Visit
Admission: RE | Admit: 2016-06-16 | Discharge: 2016-06-16 | Disposition: A | Discharge status: Home / Self Care | Payer: Medicare Other | Source: Ambulatory Visit | Attending: Radiation Oncology | Admitting: Radiation Oncology

## 2016-06-16 ENCOUNTER — Ambulatory Visit: Payer: Medicare Other | Admitting: Radiation Oncology

## 2016-06-16 DIAGNOSIS — C61 Malignant neoplasm of prostate: Secondary | ICD-10-CM | POA: Diagnosis not present

## 2016-06-16 DIAGNOSIS — R351 Nocturia: Secondary | ICD-10-CM | POA: Diagnosis not present

## 2016-06-16 DIAGNOSIS — Z510 Encounter for antineoplastic radiation therapy: Secondary | ICD-10-CM | POA: Diagnosis not present

## 2016-06-16 DIAGNOSIS — R3915 Urgency of urination: Secondary | ICD-10-CM | POA: Diagnosis not present

## 2016-06-16 DIAGNOSIS — Z6825 Body mass index (BMI) 25.0-25.9, adult: Secondary | ICD-10-CM | POA: Diagnosis not present

## 2016-06-16 DIAGNOSIS — R634 Abnormal weight loss: Secondary | ICD-10-CM | POA: Diagnosis not present

## 2016-06-19 ENCOUNTER — Ambulatory Visit: Admission: RE | Admit: 2016-06-19 | Payer: Medicare Other | Source: Ambulatory Visit | Admitting: Radiation Oncology

## 2016-06-19 ENCOUNTER — Ambulatory Visit
Admission: RE | Admit: 2016-06-19 | Discharge: 2016-06-19 | Disposition: A | Discharge status: Home / Self Care | Payer: Medicare Other | Source: Ambulatory Visit | Attending: Radiation Oncology | Admitting: Radiation Oncology

## 2016-06-19 DIAGNOSIS — Z6825 Body mass index (BMI) 25.0-25.9, adult: Secondary | ICD-10-CM | POA: Diagnosis not present

## 2016-06-19 DIAGNOSIS — R634 Abnormal weight loss: Secondary | ICD-10-CM | POA: Diagnosis not present

## 2016-06-19 DIAGNOSIS — R3915 Urgency of urination: Secondary | ICD-10-CM | POA: Diagnosis not present

## 2016-06-19 DIAGNOSIS — Z510 Encounter for antineoplastic radiation therapy: Secondary | ICD-10-CM | POA: Diagnosis not present

## 2016-06-19 DIAGNOSIS — C61 Malignant neoplasm of prostate: Secondary | ICD-10-CM | POA: Diagnosis not present

## 2016-06-19 DIAGNOSIS — R351 Nocturia: Secondary | ICD-10-CM | POA: Diagnosis not present

## 2016-06-20 ENCOUNTER — Ambulatory Visit
Admission: RE | Admit: 2016-06-20 | Discharge: 2016-06-20 | Disposition: A | Discharge status: Home / Self Care | Payer: Medicare Other | Source: Ambulatory Visit | Attending: Radiation Oncology | Admitting: Radiation Oncology

## 2016-06-20 DIAGNOSIS — C61 Malignant neoplasm of prostate: Secondary | ICD-10-CM | POA: Diagnosis not present

## 2016-06-20 DIAGNOSIS — R351 Nocturia: Secondary | ICD-10-CM | POA: Diagnosis not present

## 2016-06-20 DIAGNOSIS — Z510 Encounter for antineoplastic radiation therapy: Secondary | ICD-10-CM | POA: Diagnosis not present

## 2016-06-20 DIAGNOSIS — R3915 Urgency of urination: Secondary | ICD-10-CM | POA: Diagnosis not present

## 2016-06-20 DIAGNOSIS — Z6825 Body mass index (BMI) 25.0-25.9, adult: Secondary | ICD-10-CM | POA: Diagnosis not present

## 2016-06-20 DIAGNOSIS — R634 Abnormal weight loss: Secondary | ICD-10-CM | POA: Diagnosis not present

## 2016-06-21 ENCOUNTER — Ambulatory Visit
Admission: RE | Admit: 2016-06-21 | Discharge: 2016-06-21 | Disposition: A | Discharge status: Home / Self Care | Payer: Medicare Other | Source: Ambulatory Visit | Attending: Radiation Oncology | Admitting: Radiation Oncology

## 2016-06-21 ENCOUNTER — Ambulatory Visit: Payer: Medicare Other | Attending: Internal Medicine | Admitting: Internal Medicine

## 2016-06-21 ENCOUNTER — Encounter: Payer: Self-pay | Admitting: Radiation Oncology

## 2016-06-21 ENCOUNTER — Encounter: Payer: Self-pay | Admitting: Internal Medicine

## 2016-06-21 VITALS — BP 127/70 | HR 83 | Resp 16 | Wt 158.2 lb

## 2016-06-21 VITALS — BP 122/77 | HR 90 | Temp 98.3°F | Resp 16 | Wt 158.0 lb

## 2016-06-21 DIAGNOSIS — Z7982 Long term (current) use of aspirin: Secondary | ICD-10-CM | POA: Insufficient documentation

## 2016-06-21 DIAGNOSIS — Z23 Encounter for immunization: Secondary | ICD-10-CM | POA: Diagnosis not present

## 2016-06-21 DIAGNOSIS — I129 Hypertensive chronic kidney disease with stage 1 through stage 4 chronic kidney disease, or unspecified chronic kidney disease: Secondary | ICD-10-CM | POA: Insufficient documentation

## 2016-06-21 DIAGNOSIS — E440 Moderate protein-calorie malnutrition: Secondary | ICD-10-CM | POA: Diagnosis not present

## 2016-06-21 DIAGNOSIS — R351 Nocturia: Secondary | ICD-10-CM | POA: Diagnosis not present

## 2016-06-21 DIAGNOSIS — Z720 Tobacco use: Secondary | ICD-10-CM | POA: Insufficient documentation

## 2016-06-21 DIAGNOSIS — E785 Hyperlipidemia, unspecified: Secondary | ICD-10-CM | POA: Diagnosis not present

## 2016-06-21 DIAGNOSIS — F17200 Nicotine dependence, unspecified, uncomplicated: Secondary | ICD-10-CM | POA: Insufficient documentation

## 2016-06-21 DIAGNOSIS — C61 Malignant neoplasm of prostate: Secondary | ICD-10-CM

## 2016-06-21 DIAGNOSIS — Z6825 Body mass index (BMI) 25.0-25.9, adult: Secondary | ICD-10-CM | POA: Diagnosis not present

## 2016-06-21 DIAGNOSIS — Z794 Long term (current) use of insulin: Secondary | ICD-10-CM

## 2016-06-21 DIAGNOSIS — Z510 Encounter for antineoplastic radiation therapy: Secondary | ICD-10-CM | POA: Diagnosis not present

## 2016-06-21 DIAGNOSIS — I10 Essential (primary) hypertension: Secondary | ICD-10-CM

## 2016-06-21 DIAGNOSIS — E1122 Type 2 diabetes mellitus with diabetic chronic kidney disease: Secondary | ICD-10-CM

## 2016-06-21 DIAGNOSIS — Z1159 Encounter for screening for other viral diseases: Secondary | ICD-10-CM

## 2016-06-21 DIAGNOSIS — E559 Vitamin D deficiency, unspecified: Secondary | ICD-10-CM | POA: Diagnosis not present

## 2016-06-21 DIAGNOSIS — R42 Dizziness and giddiness: Secondary | ICD-10-CM | POA: Diagnosis not present

## 2016-06-21 DIAGNOSIS — R634 Abnormal weight loss: Secondary | ICD-10-CM | POA: Diagnosis not present

## 2016-06-21 DIAGNOSIS — N183 Chronic kidney disease, stage 3 unspecified: Secondary | ICD-10-CM

## 2016-06-21 DIAGNOSIS — R3915 Urgency of urination: Secondary | ICD-10-CM | POA: Diagnosis not present

## 2016-06-21 DIAGNOSIS — Z79899 Other long term (current) drug therapy: Secondary | ICD-10-CM | POA: Insufficient documentation

## 2016-06-21 LAB — LIPID PANEL
CHOLESTEROL: 213 mg/dL — AB (ref <200)
HDL: 59 mg/dL (ref >40)
LDL CALC: 104 mg/dL — AB (ref <100)
TRIGLYCERIDES: 251 mg/dL — AB (ref <150)
Total CHOL/HDL Ratio: 3.6 Ratio (ref <5.0)
VLDL: 50 mg/dL — AB (ref <30)

## 2016-06-21 LAB — BASIC METABOLIC PANEL WITH GFR
BUN: 31 mg/dL — ABNORMAL HIGH (ref 7–25)
CHLORIDE: 107 mmol/L (ref 98–110)
CO2: 17 mmol/L — AB (ref 20–31)
Calcium: 9.4 mg/dL (ref 8.6–10.3)
Creat: 1.68 mg/dL — ABNORMAL HIGH (ref 0.70–1.25)
GFR, EST NON AFRICAN AMERICAN: 41 mL/min — AB (ref >=60)
GFR, Est African American: 48 mL/min — ABNORMAL LOW (ref >=60)
GLUCOSE: 127 mg/dL — AB (ref 65–99)
POTASSIUM: 4.9 mmol/L (ref 3.5–5.3)
SODIUM: 139 mmol/L (ref 135–146)

## 2016-06-21 LAB — POCT GLYCOSYLATED HEMOGLOBIN (HGB A1C): HEMOGLOBIN A1C: 6.5

## 2016-06-21 LAB — GLUCOSE, POCT (MANUAL RESULT ENTRY): POC GLUCOSE: 143 mg/dL — AB (ref 70–99)

## 2016-06-21 MED ORDER — AMLODIPINE BESYLATE 5 MG PO TABS
5.0000 mg | ORAL_TABLET | Freq: Every day | ORAL | 3 refills | Status: DC
Start: 2016-06-21 — End: 2016-10-02

## 2016-06-21 MED ORDER — INSULIN ASPART PROT & ASPART (70-30 MIX) 100 UNIT/ML PEN: 7.0000 [IU] | PEN_INJECTOR | Freq: Two times a day (BID) | SUBCUTANEOUS | 2 refills | Status: DC

## 2016-06-21 MED ORDER — ASPIRIN EC 81 MG PO TBEC: 81.0000 mg | DELAYED_RELEASE_TABLET | Freq: Every day | ORAL | 3 refills | Status: DC

## 2016-06-21 MED ORDER — PRAVASTATIN SODIUM 20 MG PO TABS: 20.0000 mg | ORAL_TABLET | Freq: Every day | ORAL | 3 refills | Status: DC

## 2016-06-21 MED ORDER — GABAPENTIN 300 MG PO CAPS: 300.0000 mg | ORAL_CAPSULE | Freq: Three times a day (TID) | ORAL | 2 refills | Status: DC

## 2016-06-21 MED ORDER — LISINOPRIL-HYDROCHLOROTHIAZIDE 10-12.5 MG PO TABS: 1.0000 | ORAL_TABLET | Freq: Every morning | ORAL | 3 refills | Status: DC

## 2016-06-21 MED FILL — !NOVOLOG 70/30 FLEXPEN: 70-30 | 42 days supply | Qty: 6 | Fill #0

## 2016-06-21 MED FILL — AMLODIPINE BESYLATE 5 MG TA: 5 | 30 days supply | Qty: 30 | Fill #0

## 2016-06-21 MED FILL — PRAVASTATIN NA 20 MG TAB: 20 | 30 days supply | Qty: 30 | Fill #0

## 2016-06-21 MED FILL — GABAPENTIN 300 MG CAPSULE: 300 | 30 days supply | Qty: 90 | Fill #0

## 2016-06-21 MED FILL — LISINOPRIL-HCTZ 10-12.5 MG: 10-12.5 | 30 days supply | Qty: 30 | Fill #0

## 2016-06-21 NOTE — Progress Notes (Signed)
One month follow up appointment card given. 

## 2016-06-21 NOTE — Progress Notes (Signed)
Weight and vitals stable. Reports his shoulder isn't hurting today. Denies pain presently. Reports nocturia x 3. Reports a "funny feeling" when he urinate but, denies dysuria. Reports a weaker intermittent stream. Reports urinary urgency continues with rare leakage. Denies diarrhea. Reports mild fatigue. Reports he was seen by PCP today and bp medication lowered. Also, he was prescribed aspirin and cholesterol medication.  BP 127/70 (BP Location: Right Arm, Patient Position: Sitting, Cuff Size: Normal)   Pulse 83   Resp 16   Wt 158 lb 3.2 oz (71.8 kg)   SpO2 100%   BMI 25.53 kg/m  Wt Readings from Last 3 Encounters:  06/21/16 158 lb 3.2 oz (71.8 kg)  06/21/16 158 lb (71.7 kg)  06/09/16 155 lb (70.3 kg)

## 2016-06-21 NOTE — Patient Instructions (Addendum)
Diabetes Mellitus and Food It is important for you to manage your blood sugar (glucose) level. Your blood glucose level can be greatly affected by what you eat. Eating healthier foods in the appropriate amounts throughout the day at about the same time each day will help you control your blood glucose level. It can also help slow or prevent worsening of your diabetes mellitus. Healthy eating may even help you improve the level of your blood pressure and reach or maintain a healthy weight. General recommendations for healthful eating and cooking habits include:  Eating meals and snacks regularly. Avoid going long periods of time without eating to lose weight.  Eating a diet that consists mainly of plant-based foods, such as fruits, vegetables, nuts, legumes, and whole grains.  Using low-heat cooking methods, such as baking, instead of high-heat cooking methods, such as deep frying.  Work with your dietitian to make sure you understand how to use the Nutrition Facts information on food labels. How can food affect me? Carbohydrates Carbohydrates affect your blood glucose level more than any other type of food. Your dietitian will help you determine how many carbohydrates to eat at each meal and teach you how to count carbohydrates. Counting carbohydrates is important to keep your blood glucose at a healthy level, especially if you are using insulin or taking certain medicines for diabetes mellitus. Alcohol Alcohol can cause sudden decreases in blood glucose (hypoglycemia), especially if you use insulin or take certain medicines for diabetes mellitus. Hypoglycemia can be a life-threatening condition. Symptoms of hypoglycemia (sleepiness, dizziness, and disorientation) are similar to symptoms of having too much alcohol. If your health care provider has given you approval to drink alcohol, do so in moderation and use the following guidelines:  Women should not have more than one drink per day, and men  should not have more than two drinks per day. One drink is equal to: ? 12 oz of beer. ? 5 oz of wine. ? 1 oz of hard liquor.  Do not drink on an empty stomach.  Keep yourself hydrated. Have water, diet soda, or unsweetened iced tea.  Regular soda, juice, and other mixers might contain a lot of carbohydrates and should be counted.  What foods are not recommended? As you make food choices, it is important to remember that all foods are not the same. Some foods have fewer nutrients per serving than other foods, even though they might have the same number of calories or carbohydrates. It is difficult to get your body what it needs when you eat foods with fewer nutrients. Examples of foods that you should avoid that are high in calories and carbohydrates but low in nutrients include:  Trans fats (most processed foods list trans fats on the Nutrition Facts label).  Regular soda.  Juice.  Candy.  Sweets, such as cake, pie, doughnuts, and cookies.  Fried foods.  What foods can I eat? Eat nutrient-rich foods, which will nourish your body and keep you healthy. The food you should eat also will depend on several factors, including:  The calories you need.  The medicines you take.  Your weight.  Your blood glucose level.  Your blood pressure level.  Your cholesterol level.  You should eat a variety of foods, including:  Protein. ? Lean cuts of meat. ? Proteins low in saturated fats, such as fish, egg whites, and beans. Avoid processed meats.  Fruits and vegetables. ? Fruits and vegetables that may help control blood glucose levels, such as apples,   yams.  Dairy products.  Choose fat-free or low-fat dairy products, such as milk, yogurt, and cheese.  Grains, bread, pasta, and rice.  Choose whole grain products, such as multigrain bread, whole oats, and brown rice. These foods may help control blood pressure.  Fats.  Foods containing healthful fats, such as nuts,  avocado, olive oil, canola oil, and fish. Does everyone with diabetes mellitus have the same meal plan? Because every person with diabetes mellitus is different, there is not one meal plan that works for everyone. It is very important that you meet with a dietitian who will help you create a meal plan that is just right for you. This information is not intended to replace advice given to you by your health care provider. Make sure you discuss any questions you have with your health care provider. Document Released: 03/23/2005 Document Revised: 12/02/2015 Document Reviewed: 05/23/2013 Elsevier Interactive Patient Education  2017 [place] (Tetanus and Diphtheria): What You Need to Know 1. Why get vaccinated? Tetanus  and diphtheria are very serious diseases. They are rare in the [place] today, but people who do become infected often have severe complications. Td vaccine is used to protect adolescents and adults from both of these diseases. Both tetanus and diphtheria are infections caused by bacteria. Diphtheria spreads from person to person through coughing or sneezing. Tetanus-causing bacteria enter the body through cuts, scratches, or wounds. TETANUS (lockjaw) causes painful muscle tightening and stiffness, usually all over the body.  It can lead to tightening of muscles in the head and neck so you can't open your mouth, swallow, or sometimes even breathe. Tetanus kills about 1 out of every 10 people who are infected even after receiving the best medical care. DIPHTHERIA can cause a thick coating to form in the back of the throat.  It can lead to breathing problems, paralysis, heart failure, and death. Before vaccines, as many as 200,000 cases of diphtheria and hundreds of cases of tetanus were reported in the [place] each year. Since vaccination began, reports of cases for both diseases have dropped by about 99%. 2. Td vaccine Td vaccine can protect adolescents and  adults from tetanus and diphtheria. Td is usually given as a booster dose every 10 years but it can also be given earlier after a severe and dirty wound or burn. Another vaccine, called Tdap, which protects against pertussis in addition to tetanus and diphtheria, is sometimes recommended instead of Td vaccine. Your doctor or the person giving you the vaccine can give you more information. Td may safely be given at the same time as other vaccines. 3. Some people should not get this vaccine  A person who has ever had a life-threatening allergic reaction after a previous dose of any tetanus or diphtheria containing vaccine, OR has a severe allergy to any part of this vaccine, should not get Td vaccine. Tell the person giving the vaccine about any severe allergies.  Talk to your doctor if you:  had severe pain or swelling after any vaccine containing diphtheria or tetanus,  ever had a condition called Guillain Barre Syndrome (GBS),  aren't feeling well on the day the shot is scheduled. 4. What are the risks from Td vaccine? With any medicine, including vaccines, there is a chance of side effects. These are usually mild and go away on their own. Serious reactions are also possible but are rare. Most people who get Td vaccine do not have any problems with it. Mild problems  following Td vaccine: (Did not interfere with activities)  Pain where the shot was given (about 8 people in 10)  Redness or swelling where the shot was given (about 1 person in 4)  Mild fever (rare)  Headache (about 1 person in 4)  Tiredness (about 1 person in 4) Moderate problems following Td vaccine: (Interfered with activities, but did not require medical attention)  Fever over 102F (rare) Severe problems following Td vaccine: (Unable to perform usual activities; required medical attention)  Swelling, severe pain, bleeding and/or redness in the arm where the shot was given (rare). Problems that could happen  after any vaccine:  People sometimes faint after a medical procedure, including vaccination. Sitting or lying down for about 15 minutes can help prevent fainting, and injuries caused by a fall. Tell your doctor if you feel dizzy, or have vision changes or ringing in the ears.  Some people get severe pain in the shoulder and have difficulty moving the arm where a shot was given. This happens very rarely.  Any medication can cause a severe allergic reaction. Such reactions from a vaccine are very rare, estimated at fewer than 1 in a million doses, and would happen within a few minutes to a few hours after the vaccination. As with any medicine, there is a very remote chance of a vaccine causing a serious injury or death. The safety of vaccines is always being monitored. For more information, visit: http://www.aguilar.org/ 5. What if there is a serious reaction? What should I look for? Look for anything that concerns you, such as signs of a severe allergic reaction, very high fever, or unusual behavior. Signs of a severe allergic reaction can include hives, swelling of the face and throat, difficulty breathing, a fast heartbeat, dizziness, and weakness. These would usually start a few minutes to a few hours after the vaccination. What should I do?  If you think it is a severe allergic reaction or other emergency that can't wait, call 9-1-1 or get the person to the nearest hospital. Otherwise, call your doctor.  Afterward, the reaction should be reported to the Vaccine Adverse Event Reporting System (VAERS). Your doctor might file this report, or you can do it yourself through the VAERS web site at www.vaers.SamedayNews.es, or by calling 4311499583.  VAERS does not give medical advice. 6. The National Vaccine Injury Compensation Program The [employer] Vaccine Injury Compensation Program (VICP) is a federal program that was created to compensate people who may have been injured by certain  vaccines. Persons who believe they may have been injured by a vaccine can learn about the program and about filing a claim by calling (719) 784-4836 or visiting the [place] website at GoldCloset.com.ee. There is a time limit to file a claim for compensation. 7. How can I learn more?  Ask your doctor. He or she can give you the vaccine package insert or suggest other sources of information.  Call your local or state health department.  Contact the Centers for Disease Control and Prevention (CDC):  Call 201-153-5683 (1-800-CDC-INFO)  Visit CDC's website at http://hunter.com/ CDC Td Vaccine VIS (10/19/15) This information is not intended to replace advice given to you by your health care provider. Make sure you discuss any questions you have with your health care provider. Document Released: 04/23/2006 Document Revised: 03/16/2016 Document Reviewed: 03/16/2016 Elsevier Interactive Patient Education  2017 [employer].

## 2016-06-21 NOTE — Progress Notes (Signed)
Robert Sparks, is a 70 y.o. male  [id number]  [id number]  DOB - 28-Aug-1945  Chief Complaint  Patient presents with  . Diabetes        Subjective:   Robert Sparks is a 70 y.o. male here today for a follow up visit, last seen 8/17, w/ hx of prostate cancer undergoing radiation trx, dm 2, ckd 3, htn, tob abuse, and hld.  Pt states he is doing well, denies any hypoglycemic events on nph 70/30 7units bid.   Of note, he does mention occasional dizziness sometimes when getting up from sitting position or when walking. He notes that last week he fell down due to dizziness, but denies any trauma and able to get up right away.   Patient has No headache, No chest pain, No abdominal pain - No Nausea, No new weakness tingling or numbness, No Cough - SOB.  No problems updated.  ALLERGIES: No Known Allergies  PAST MEDICAL HISTORY: Past Medical History:  Diagnosis Date  . Arthritis    shoulders  . CKD (chronic kidney disease), stage III   . Elevated PSA   . GERD (gastroesophageal reflux disease)   . History of ketoacidosis    03-29-2015   . History of sepsis    07-25-2013  non-compliant w/ medication  . Hypertension   . Nocturia   . Peripheral neuropathy ([place])   . Prostate cancer ([place])   . Type 2 diabetes mellitus with insulin therapy ([place])     MEDICATIONS AT HOME: Prior to Admission medications   Medication Sig Start Date End Date Taking? Authorizing Provider  amLODipine (NORVASC) 5 MG tablet Take 1 tablet (5 mg total) by mouth daily. 06/21/16   Maren Reamer, MD  aspirin EC 81 MG tablet Take 1 tablet (81 mg total) by mouth daily. 06/21/16   Maren Reamer, MD  Blood Glucose Monitoring Suppl (TRUE METRIX GO GLUCOSE METER) w/Device KIT 1 each by Does not apply route every 8 (eight) hours as needed. 11/01/15   Maren Reamer, MD  gabapentin (NEURONTIN) 300 MG capsule Take 1 capsule (300 mg total) by mouth 3 (three) times daily. 06/21/16   Maren Reamer, MD    glucose blood (TRUE METRIX BLOOD GLUCOSE TEST) test strip Use as instructed 11/29/15   Maren Reamer, MD  glucose blood test strip  11/29/15   Historical Provider, MD  insulin aspart protamine - aspart (NOVOLOG 70/30 MIX) (70-30) 100 UNIT/ML FlexPen Inject 0.07 mLs (7 Units total) into the skin 2 (two) times daily with a meal. 06/21/16   Maren Reamer, MD  Insulin Pen Needle (PEN NEEDLES 3/16") 31G X 5 MM MISC Use one needle twice a day as directed. 10/28/15   Veryl Speak, MD  lisinopril-hydrochlorothiazide (PRINZIDE,ZESTORETIC) 10-12.5 MG tablet Take 1 tablet by mouth every morning. 06/21/16   Maren Reamer, MD  polyethylene glycol powder (GLYCOLAX/MIRALAX) powder Take as directed for colonoscopy prep. 06/08/16   Amy S [place], PA-C  pravastatin (PRAVACHOL) 20 MG tablet Take 1 tablet (20 mg total) by mouth daily. 06/21/16   Maren Reamer, MD  TRUEPLUS LANCETS 26G MISC 1 each by Does not apply route every 8 (eight) hours as needed. 11/01/15   Maren Reamer, MD  ULTICARE MICRO PEN NEEDLES 32G X 4 MM MISC  11/29/15   Historical Provider, MD     Objective:   Vitals:   06/21/16 1055  BP: 122/77  Pulse: 90  Resp: 16  Temp: 98.3 F (36.8  C)  TempSrc: Oral  SpO2: 100%  Weight: 158 lb (71.7 kg)   At Radiation trx, sbps ranging 105-109 (11/22-12/7)  Exam General appearance : Awake, alert, not in any distress. Speech Clear. Not toxic looking, elderly appearing. HEENT: Atraumatic and Normocephalic, pupils equally reactive to light. Neck: supple, no JVD.  Chest:Good air entry bilaterally, no added sounds. CVS: S1 S2 regular, no murmurs/gallups or rubs. Abdomen: Bowel sounds active, Non tender and not distended with no gaurding, rigidity or rebound. Extremities: B/L Lower Ext shows no edema, both legs are warm to touch Neurology: Awake alert, and oriented X 3, CN II-XII grossly intact, Non focal Skin:No Rash  Data Review Lab Results  Component Value Date   HGBA1C 6.5  06/21/2016   HGBA1C 10.0 11/01/2015   HGBA1C 10.5 (H) 03/30/2015    Depression screen PHQ 2/9 06/21/2016 02/23/2016 02/21/2016 11/29/2015 11/10/2015  Decreased Interest 0 0 0 0 0  Down, Depressed, Hopeless 0 0 0 0 0  PHQ - 2 Score 0 0 0 0 0  Altered sleeping - - - - -  Tired, decreased energy - - - - -  Change in appetite - - - - -  Feeling bad or failure about yourself  - - - - -  Trouble concentrating - - - - -  Moving slowly or fidgety/restless - - - - -  Suicidal thoughts - - - - -  PHQ-9 Score - - - - -      Assessment & Plan   1. Controlled type 2 diabetes mellitus with stage 3 chronic kidney disease, with long-term current use of insulin ([place]) - much better, denies hypoglycemic events, same nph 70/30 at 7 units bid for now. - not on any po rx - BASIC METABOLIC PANEL WITH GFR - POCT glucose (manual entry) 143 - POCT glycosylated hemoglobin (Hb A1C)  6.5 (from 10)  2. CKD stage 3 due to type 2 diabetes mellitus (H[facility]) Bmp today, may need referral to renal if ckd 4.   3. Essential hypertension,  - normaltensive today, but noted soft bps in Radiation trx - worried that may be contributing to his dizzy spells - keep prinzide 10/12.5 for now, - reduce norvasc to 5 qd.   4. Malignant neoplasm of prostate ([place]) Undergoing rad trx currently  5. Dizziness - worried related to bp meds, reduced norvasc to 5 today - advised pt to shake his legs when sitting prior to get up to increase blood flow. - if persist and renal fn worse, may need to consider dc hctz.  6. Hyperlipidemia, unspecified hyperlipidemia type - ASCVD risk >20% in 10 years, discussed this w/ pt. - start pravastatin 20 qhs - asa 81 qd recd as well, ordered - Lipid Panel  7. Need for hepatitis C screening test - Hepatitis C antibody  8. Vitamin D deficiency risk,  - VITAMIN D 25 Hydroxy (Vit-D Deficiency, Fractures)  9. Has colonoscopy eval scheduled in Jan.  10. tdap today.  11.  tob abuse Total  cessation recd, smokes much less now.  5. Moderate protein-calorie malnutrition ([place]) Case of glucerna x 1 month provided, recd increase protein intake as able  Patient have been counseled extensively about nutrition and exercise  Return in about 3 months (around 09/19/2016), or if symptoms worsen or fail to improve.   The patient was given clear instructions to go to ER or return to medical center if symptoms don't improve, worsen or new problems develop. The patient verbalized understanding. The patient  was told to call to get lab results if they haven't heard anything in the next week.   This note has been created with Surveyor, quantity. Any transcriptional errors are unintentional.   Maren Reamer, MD, [facility], [place]   06/21/2016, 11:56 AM

## 2016-06-21 NOTE — Progress Notes (Signed)
  Radiation Oncology         [phone]   Name: Robert Sparks MRN: [id number]   Date: 06/21/2016  DOB: 12/22/1945   Weekly Radiation Therapy Management    ICD-9-CM ICD-10-CM   1. Malignant neoplasm of prostate ([facility]) 185 C61     Current Dose: 65 Gy  Planned Dose:  75 Gy  Narrative The patient presents for routine under treatment assessment.  Weight and vitals stable. He reports his shoulder is not hurting today. He denies any other pain presently. He reports a "funny feeling" when he urinates, but denies dysuria. He reports a weaker, intermittent stream. Reports urinary urgency continues with rare occurrence of leakage. The patient denies diarrhea. He reports he was seen by his PCP today and had his BP medication lowered. He was also prescribed aspirin and cholesterol medication.  Set-up films were reviewed. The chart was checked.  Physical Findings  weight is 158 lb 3.2 oz (71.8 kg). His blood pressure is 127/70 and his pulse is 83. His respiration is 16 and oxygen saturation is 100%.  No significant changes.  Impression The patient is tolerating radiation.  Plan Continue treatment as planned.      Sheral Apley Tammi Klippel, M.D.  This document serves as a record of services personally performed by Tyler Pita, MD. It was created on his behalf by Maryla Morrow, a trained medical scribe. The creation of this record is based on the scribe's personal observations and the provider's statements to them. This document has been checked and approved by the attending provider.

## 2016-06-22 ENCOUNTER — Ambulatory Visit
Admission: RE | Admit: 2016-06-22 | Discharge: 2016-06-22 | Disposition: A | Discharge status: Home / Self Care | Payer: Medicare Other | Source: Ambulatory Visit | Attending: Radiation Oncology | Admitting: Radiation Oncology

## 2016-06-22 DIAGNOSIS — R351 Nocturia: Secondary | ICD-10-CM | POA: Diagnosis not present

## 2016-06-22 DIAGNOSIS — R634 Abnormal weight loss: Secondary | ICD-10-CM | POA: Diagnosis not present

## 2016-06-22 DIAGNOSIS — C61 Malignant neoplasm of prostate: Secondary | ICD-10-CM | POA: Diagnosis not present

## 2016-06-22 DIAGNOSIS — Z510 Encounter for antineoplastic radiation therapy: Secondary | ICD-10-CM | POA: Diagnosis not present

## 2016-06-22 DIAGNOSIS — R3915 Urgency of urination: Secondary | ICD-10-CM | POA: Diagnosis not present

## 2016-06-22 DIAGNOSIS — Z6825 Body mass index (BMI) 25.0-25.9, adult: Secondary | ICD-10-CM | POA: Diagnosis not present

## 2016-06-22 LAB — VITAMIN D 25 HYDROXY (VIT D DEFICIENCY, FRACTURES): Vit D, 25-Hydroxy: 28 ng/mL — ABNORMAL LOW (ref 30–100)

## 2016-06-22 LAB — HEPATITIS C ANTIBODY: HCV Ab: NEGATIVE

## 2016-06-23 ENCOUNTER — Ambulatory Visit
Admission: RE | Admit: 2016-06-23 | Discharge: 2016-06-23 | Disposition: A | Discharge status: Home / Self Care | Payer: Medicare Other | Source: Ambulatory Visit | Attending: Radiation Oncology | Admitting: Radiation Oncology

## 2016-06-23 DIAGNOSIS — R351 Nocturia: Secondary | ICD-10-CM | POA: Diagnosis not present

## 2016-06-23 DIAGNOSIS — C61 Malignant neoplasm of prostate: Secondary | ICD-10-CM | POA: Diagnosis not present

## 2016-06-23 DIAGNOSIS — R3915 Urgency of urination: Secondary | ICD-10-CM | POA: Diagnosis not present

## 2016-06-23 DIAGNOSIS — Z510 Encounter for antineoplastic radiation therapy: Secondary | ICD-10-CM | POA: Diagnosis not present

## 2016-06-23 DIAGNOSIS — Z6825 Body mass index (BMI) 25.0-25.9, adult: Secondary | ICD-10-CM | POA: Diagnosis not present

## 2016-06-23 DIAGNOSIS — R634 Abnormal weight loss: Secondary | ICD-10-CM | POA: Diagnosis not present

## 2016-06-26 ENCOUNTER — Ambulatory Visit
Admission: RE | Admit: 2016-06-26 | Discharge: 2016-06-26 | Disposition: A | Discharge status: Home / Self Care | Payer: Medicare Other | Source: Ambulatory Visit | Attending: Radiation Oncology | Admitting: Radiation Oncology

## 2016-06-26 DIAGNOSIS — Z6825 Body mass index (BMI) 25.0-25.9, adult: Secondary | ICD-10-CM | POA: Diagnosis not present

## 2016-06-26 DIAGNOSIS — R3915 Urgency of urination: Secondary | ICD-10-CM | POA: Diagnosis not present

## 2016-06-26 DIAGNOSIS — R351 Nocturia: Secondary | ICD-10-CM | POA: Diagnosis not present

## 2016-06-26 DIAGNOSIS — C61 Malignant neoplasm of prostate: Secondary | ICD-10-CM | POA: Diagnosis not present

## 2016-06-26 DIAGNOSIS — Z510 Encounter for antineoplastic radiation therapy: Secondary | ICD-10-CM | POA: Diagnosis not present

## 2016-06-26 DIAGNOSIS — R634 Abnormal weight loss: Secondary | ICD-10-CM | POA: Diagnosis not present

## 2016-06-27 ENCOUNTER — Ambulatory Visit
Admission: RE | Admit: 2016-06-27 | Discharge: 2016-06-27 | Disposition: A | Discharge status: Home / Self Care | Payer: Medicare Other | Source: Ambulatory Visit | Attending: Radiation Oncology | Admitting: Radiation Oncology

## 2016-06-27 ENCOUNTER — Ambulatory Visit: Payer: Medicare Other

## 2016-06-27 DIAGNOSIS — C61 Malignant neoplasm of prostate: Secondary | ICD-10-CM | POA: Diagnosis not present

## 2016-06-27 DIAGNOSIS — R351 Nocturia: Secondary | ICD-10-CM | POA: Diagnosis not present

## 2016-06-27 DIAGNOSIS — Z510 Encounter for antineoplastic radiation therapy: Secondary | ICD-10-CM | POA: Diagnosis not present

## 2016-06-27 DIAGNOSIS — R3915 Urgency of urination: Secondary | ICD-10-CM | POA: Diagnosis not present

## 2016-06-27 DIAGNOSIS — R634 Abnormal weight loss: Secondary | ICD-10-CM | POA: Diagnosis not present

## 2016-06-27 DIAGNOSIS — Z6825 Body mass index (BMI) 25.0-25.9, adult: Secondary | ICD-10-CM | POA: Diagnosis not present

## 2016-06-28 ENCOUNTER — Ambulatory Visit
Admission: RE | Admit: 2016-06-28 | Discharge: 2016-06-28 | Disposition: A | Discharge status: Home / Self Care | Payer: Medicare Other | Source: Ambulatory Visit | Attending: Radiation Oncology | Admitting: Radiation Oncology

## 2016-06-28 ENCOUNTER — Inpatient Hospital Stay
Admission: RE | Admit: 2016-06-28 | Discharge: 2016-06-28 | Disposition: A | Discharge status: Home / Self Care | Payer: Self-pay | Source: Ambulatory Visit | Admitting: Radiation Oncology

## 2016-06-28 ENCOUNTER — Encounter: Payer: Self-pay | Admitting: Medical Oncology

## 2016-06-28 ENCOUNTER — Encounter: Payer: Self-pay | Admitting: Radiation Oncology

## 2016-06-28 VITALS — BP 116/64 | HR 86 | Resp 16 | Wt 159.0 lb

## 2016-06-28 DIAGNOSIS — R634 Abnormal weight loss: Secondary | ICD-10-CM | POA: Diagnosis not present

## 2016-06-28 DIAGNOSIS — R3915 Urgency of urination: Secondary | ICD-10-CM | POA: Diagnosis not present

## 2016-06-28 DIAGNOSIS — C61 Malignant neoplasm of prostate: Secondary | ICD-10-CM

## 2016-06-28 DIAGNOSIS — R351 Nocturia: Secondary | ICD-10-CM | POA: Diagnosis not present

## 2016-06-28 DIAGNOSIS — Z510 Encounter for antineoplastic radiation therapy: Secondary | ICD-10-CM | POA: Diagnosis not present

## 2016-06-28 DIAGNOSIS — Z6825 Body mass index (BMI) 25.0-25.9, adult: Secondary | ICD-10-CM | POA: Diagnosis not present

## 2016-06-28 NOTE — Progress Notes (Signed)
  Radiation Oncology         [phone] ________________________________  Name: Raycen Libonati Groleau MRN: [id number]  Date: 06/28/2016  DOB: 01/15/1946  Weekly Radiation Therapy Management    ICD-9-CM ICD-10-CM   1. Malignant neoplasm of prostate ([facility]) 185 C61     Current Dose: 75 Gy     Planned Dose:  75 Gy  Narrative . . . . . . . . The patient presents for the final under treatment assessment.                                            The patient has had some continuation of previously noted symptoms.  Denies pain. Reports nocturia x 4. Reports intermittent urinary leakage related to urgency. Denies dysuria or hematuria. Reports weak urine stream. Denies diarrhea. Reports moderated fatigue.                                  Set-up films were reviewed.                                 The chart was checked. Physical Findings. . . Weight essentially stable.  No significant changes. Impression . . . . . . . The patient tolerated radiation relatively well. Plan . . . . . . . . . . . . Complete radiation today as scheduled, and follow-up in one month. The patient was encouraged to call or return to the clinic in the interim for any worsening symptoms.  Scheduled to see [facility] in February for ADT.   ________________________________  Sheral Apley. Tammi Klippel, M.D.

## 2016-06-28 NOTE — Progress Notes (Signed)
Weight and vitals stable. Denies pain. Reports nocturia x 4. Reports intermittent urinary leakage related to urgency. Denies dysuria or hematuria. Reports weak urine stream. Denies diarrhea. Reports moderated fatigue. Scheduled to see [facility] in February for ADT. Another one month follow up appointment card was given today. Patient understands to contact this RN with future needs.   BP 116/64 (BP Location: Left Arm, Patient Position: Sitting, Cuff Size: Normal)   Pulse 86   Resp 16   Wt 159 lb (72.1 kg)   SpO2 100%   BMI 25.66 kg/m  Wt Readings from Last 3 Encounters:  06/28/16 159 lb (72.1 kg)  06/21/16 158 lb 3.2 oz (71.8 kg)  06/21/16 158 lb (71.7 kg)

## 2016-06-29 ENCOUNTER — Telehealth: Payer: Self-pay

## 2016-06-29 NOTE — Telephone Encounter (Signed)
Contacted pt to go over lab results pt didn't answer lvm asking pt to give me a call back at his earliest convenience  

## 2016-07-09 NOTE — Progress Notes (Signed)
  Radiation Oncology         [phone] ________________________________  Name: Robert Sparks MRN: [id number]  Date: 06/28/2016  DOB: December 26, 1945  End of Treatment Note  Diagnosis:   70 yo with stage T2b adenocarcinoma of the prostate, Gleason 4+3 and a PSA of 62     Indication for treatment:  Curative, Definitive Radiotherapy       Radiation treatment dates:   05/02/16-06/28/16  Site/dose:  1. The prostate, seminal vesicles, and pelvic lymph nodes were initially treated to 45 Gy in 25 fractions of 1.8 Gy  2. The prostate only was boosted to 75 Gy with 15 additional fractions of 2.0 Gy   Beams/energy:  1. The prostate, seminal vesicles, and pelvic lymph nodes were initially treated using VMAT intensity modulated radiotherapy delivering 6 megavolt photons. Image guidance was performed with CB-CT studies prior to each fraction. He was immobilized with a body fix lower extremity mold.  2. the prostate only was boosted using VMAT intensity modulated radiotherapy delivering 6 megavolt photons. Image guidance was performed with CB-CT studies prior to each fraction. He was immobilized with a body fix lower extremity mold.  Narrative: The patient tolerated radiation treatment relatively well.   The patient experienced some minor urinary irritation and modest fatigue.  Denies pain. Reports nocturia x 4. Reports intermittent urinary leakage related to urgency. Denies dysuria or hematuria. Reports weak urine stream. Denies diarrhea. Reports moderated fatigue  Plan: The patient has completed radiation treatment. He will return to radiation oncology clinic for routine followup in one month. I advised him to call or return sooner if he has any questions or concerns related to his recovery or treatment. ________________________________  Sheral Apley. Tammi Klippel, M.D.

## 2016-08-01 ENCOUNTER — Other Ambulatory Visit: Payer: Self-pay | Admitting: Physician Assistant

## 2016-08-01 MED FILL — PRAVASTATIN NA 20 MG TAB: 20 | 30 days supply | Qty: 30 | Fill #1

## 2016-08-01 MED FILL — AMLODIPINE BESYLATE 5 MG TA: 5 | 30 days supply | Qty: 30 | Fill #1

## 2016-08-01 MED FILL — GABAPENTIN 300 MG CAPSULE: 300 | 30 days supply | Qty: 90 | Fill #1

## 2016-08-01 MED FILL — LISINOPRIL-HCTZ 10-12.5 MG: 10-12.5 | 30 days supply | Qty: 30 | Fill #1

## 2016-08-02 MED FILL — POLYETHYLENE GLYCOL 3350: 15 days supply | Qty: 255 | Fill #0

## 2016-08-02 MED FILL — !NOVOLOG 70/30 FLEXPEN: 70-30 | 42 days supply | Qty: 6 | Fill #1

## 2016-08-03 MED FILL — TRUE METRIX TEST STRIP: 25 days supply | Qty: 100 | Fill #7

## 2016-08-07 ENCOUNTER — Telehealth: Payer: Self-pay | Admitting: Internal Medicine

## 2016-08-07 ENCOUNTER — Other Ambulatory Visit: Payer: Self-pay | Admitting: Internal Medicine

## 2016-08-07 MED ORDER — ACCU-CHEK SOFTCLIX LANCET DEV MISC: 11 refills | Status: DC

## 2016-08-07 MED ORDER — GLUCOSE BLOOD VI STRP: ORAL_STRIP | 12 refills | Status: DC

## 2016-08-07 MED ORDER — ACCU-CHEK AVIVA PLUS W/DEVICE KIT: 1.0000 "application " | PACK | Freq: Three times a day (TID) | 0 refills | Status: DC

## 2016-08-07 NOTE — Telephone Encounter (Signed)
yes

## 2016-08-09 ENCOUNTER — Encounter: Payer: Medicare Other | Admitting: Internal Medicine

## 2016-08-10 NOTE — Progress Notes (Signed)
[name]. Robert Sparks 71 yo with , Gleason 4+3 and a PSA of 62  radiation completed 06-28-16  one month FU.   Pain: He is currently denies pain.    URINARY: He denies reports urinary frequency,urinary urgency,having incontinence.  Reports having incomplete emptying of his bladder at times feels he usually empties his bladder. Pt states he gets up to urinate x3   times per night. BOWEL: He reports a  bowel movement everyday to every other day.  Reports having diarrhea two days ago did not take have to take any imodium. Fatigue:Having fatigue at times during the day if he does to much physical activity. Appetite:Reports a very good appetite and he eats two meals a day and snacks. Weight: Wt Readings from Last 3 Encounters:  08/15/16 157 lb 12.8 oz (71.6 kg)  06/28/16 159 lb (72.1 kg)  06/21/16 158 lb 3.2 oz (71.8 kg)   Urologist:[place],MD will see 08-30-16 Lupron injection: 07-30-16 next injection    Will be the second injection                                                         PSA: Lupron injection every 6 months  BP 128/71   Pulse 87   Temp 98.1 F (36.7 C) (Oral)   Resp 18   Ht 5\' 6"  (1.676 m)   Wt 157 lb 12.8 oz (71.6 kg)   SpO2 100%   BMI 25.47 kg/m

## 2016-08-15 ENCOUNTER — Encounter: Payer: Self-pay | Admitting: *Deleted

## 2016-08-15 ENCOUNTER — Encounter: Payer: Self-pay | Admitting: Radiation Oncology

## 2016-08-15 ENCOUNTER — Ambulatory Visit
Admission: RE | Admit: 2016-08-15 | Discharge: 2016-08-15 | Disposition: A | Discharge status: Home / Self Care | Payer: Medicare HMO | Source: Ambulatory Visit | Attending: Radiation Oncology | Admitting: Radiation Oncology

## 2016-08-15 VITALS — BP 128/71 | HR 87 | Temp 98.1°F | Resp 18 | Ht 66.0 in | Wt 157.8 lb

## 2016-08-15 DIAGNOSIS — Z7982 Long term (current) use of aspirin: Secondary | ICD-10-CM | POA: Insufficient documentation

## 2016-08-15 DIAGNOSIS — Z794 Long term (current) use of insulin: Secondary | ICD-10-CM | POA: Diagnosis not present

## 2016-08-15 DIAGNOSIS — C61 Malignant neoplasm of prostate: Secondary | ICD-10-CM | POA: Diagnosis not present

## 2016-08-15 NOTE — Progress Notes (Signed)
[facility] message left to call back about changing appointment time today to 1400 instead of 1500.  Asked to ask for Tonie at [phone].

## 2016-08-15 NOTE — Progress Notes (Signed)
Radiation Oncology         [phone]) [phone] ________________________________  Name: Robert Sparks MRN: [id number]  Date: 08/15/2016  DOB: 04/20/1946  Post Treatment Note  CC: Maren Reamer, MD  Festus Aloe, MD  Diagnosis:  71 yo with stage T2b adenocarcinoma of the prostate, Gleason 4+3 and a PSA of 62.     Interval Since Last Radiation:  7 weeks   05/02/16-06/28/16: 1. The prostate, seminal vesicles, and pelvic lymph nodes were initially treated to 45 Gy in 25 fractions of 1.8 Gy  2. The prostate only was boosted to 75 Gy with 15 additional fractions of 2.0 Gy   Narrative:  The patient returns today for routine follow-up. He experienced moderate fatigue and urinary dysfunction including urgency intermittently causing leakage.                           On review of systems, the patient states his urinary function is improving. On occasion he still experiences urgency. He has had a few accident spelled reports that these have lessened since completing treatment. He reports that his bowels are constipated, but are not is troublesome as they have been when he was having looser stools previously. No other complaints or verbalized.  ALLERGIES:  has No Known Allergies.  Meds: Current Outpatient Prescriptions  Medication Sig Dispense Refill  . amLODipine (NORVASC) 5 MG tablet Take 1 tablet (5 mg total) by mouth daily. 90 tablet 3  . aspirin EC 81 MG tablet Take 1 tablet (81 mg total) by mouth daily. 90 tablet 3  . Blood Glucose Monitoring Suppl (ACCU-CHEK AVIVA PLUS) w/Device KIT 1 application by Does not apply route 3 (three) times daily. 1 kit 0  . gabapentin (NEURONTIN) 300 MG capsule Take 1 capsule (300 mg total) by mouth 3 (three) times daily. 90 capsule 2  . glucose blood test strip Use as instructed 100 each 12  . insulin aspart protamine - aspart (NOVOLOG 70/30 MIX) (70-30) 100 UNIT/ML FlexPen Inject 0.07 mLs (7 Units total) into the skin 2 (two) times daily with a meal. 6 mL  2  . Insulin Pen Needle (PEN NEEDLES 3/16") 31G X 5 MM MISC Use one needle twice a day as directed. 30 each 6  . Lancet Devices (ACCU-CHEK SOFTCLIX) lancets Use as instructed 1 each 11  . lisinopril-hydrochlorothiazide (PRINZIDE,ZESTORETIC) 10-12.5 MG tablet Take 1 tablet by mouth every morning. 90 tablet 3  . NOVOLOG MIX 70/30 FLEXPEN (70-30) 100 UNIT/ML FlexPen   2  . polyethylene glycol powder (GLYCOLAX/MIRALAX) powder TAKE AS DIRECTED FOR COLONOSCOPY PREP. 255 g 0  . pravastatin (PRAVACHOL) 20 MG tablet Take 1 tablet (20 mg total) by mouth daily. 90 tablet 3  . TRUEPLUS LANCETS 26G MISC 1 each by Does not apply route every 8 (eight) hours as needed. 100 each 12  . ULTICARE MICRO PEN NEEDLES 32G X 4 MM MISC   6   Current Facility-Administered Medications  Medication Dose Route Frequency Provider Last Rate Last Dose  . betamethasone acetate-betamethasone sodium phosphate (CELESTONE) injection 3 mg  3 mg Intramuscular Once Edrick Kins, DPM        Physical Findings:  height is '5\' 6"'  (1.676 m) and weight is 157 lb 12.8 oz (71.6 kg). His oral temperature is 98.1 F (36.7 C). His blood pressure is 128/71 and his pulse is 87. His respiration is 18 and oxygen saturation is 100%.  Pain Assessment Pain Score: 0-No pain/10  In general this is a well appearing African American male in no acute distress. He's alert and oriented x4 and appropriate throughout the examination. Cardiopulmonary assessment is negative for acute distress and he exhibits normal effort.   Lab Findings: Lab Results  Component Value Date   WBC 6.0 10/28/2015   HGB 15.0 12/21/2015   HCT 44.0 12/21/2015   MCV 101.0 (H) 10/28/2015   PLT 245 10/28/2015     Radiographic Findings: No results found.  Impression/Plan: 1. 71 yo with stage T2b adenocarcinoma of the prostate, Gleason 4+3 and a PSA of 62. The patient will continue to follow up with Dr. Junious Silk for further evaluation of his PSA. We will continue to follow him  as needed, and his PSA results will be communicated back from Dr. Lyndal Rainbow office. He is encouraged to call if he has any questions or concerns regarding his previous radiotherapy.         Carola Rhine, PAC

## 2016-08-30 ENCOUNTER — Ambulatory Visit: Payer: Medicare Other | Admitting: Podiatry

## 2016-09-04 ENCOUNTER — Ambulatory Visit: Payer: Medicare HMO | Admitting: Podiatry

## 2016-09-07 MED FILL — PRAVASTATIN NA 20 MG TAB: 20 | 30 days supply | Qty: 30 | Fill #2

## 2016-09-14 MED FILL — GABAPENTIN 300 MG CAPSULE: 300 | 30 days supply | Qty: 90 | Fill #2

## 2016-09-14 MED FILL — AMLODIPINE BESYLATE 5 MG TA: 5 | 30 days supply | Qty: 30 | Fill #2

## 2016-09-14 MED FILL — LISINOPRIL-HCTZ 10-12.5 MG: 10-12.5 | 30 days supply | Qty: 30 | Fill #2

## 2016-09-18 MED FILL — NOVOLOG MIX 70-30 FLEXPEN S: (70-30) 100 | 28 days supply | Qty: 3 | Fill #0

## 2016-09-18 MED FILL — ACCU-CHEK AVIVA PLUS TEST S: 25 days supply | Qty: 100 | Fill #0

## 2016-09-26 MED FILL — ACCU-CHEK AVIVA PLUS W/DEVI: W/DEVICE | 1 days supply | Qty: 1 | Fill #0

## 2016-09-26 MED FILL — ACCU-CHEK SOFTCLIX LANCETS: 25 days supply | Qty: 100 | Fill #0

## 2016-10-02 ENCOUNTER — Encounter: Payer: Self-pay | Admitting: Internal Medicine

## 2016-10-02 ENCOUNTER — Ambulatory Visit: Payer: Medicare HMO | Attending: Internal Medicine | Admitting: Internal Medicine

## 2016-10-02 VITALS — BP 123/73 | HR 86 | Temp 98.3°F | Wt 159.4 lb

## 2016-10-02 DIAGNOSIS — C61 Malignant neoplasm of prostate: Secondary | ICD-10-CM | POA: Insufficient documentation

## 2016-10-02 DIAGNOSIS — N183 Chronic kidney disease, stage 3 (moderate): Secondary | ICD-10-CM

## 2016-10-02 DIAGNOSIS — E1122 Type 2 diabetes mellitus with diabetic chronic kidney disease: Secondary | ICD-10-CM

## 2016-10-02 DIAGNOSIS — M6530 Trigger finger, unspecified finger: Secondary | ICD-10-CM | POA: Diagnosis not present

## 2016-10-02 DIAGNOSIS — Z8546 Personal history of malignant neoplasm of prostate: Secondary | ICD-10-CM | POA: Insufficient documentation

## 2016-10-02 DIAGNOSIS — G629 Polyneuropathy, unspecified: Secondary | ICD-10-CM | POA: Insufficient documentation

## 2016-10-02 DIAGNOSIS — I129 Hypertensive chronic kidney disease with stage 1 through stage 4 chronic kidney disease, or unspecified chronic kidney disease: Secondary | ICD-10-CM | POA: Diagnosis not present

## 2016-10-02 DIAGNOSIS — Z8619 Personal history of other infectious and parasitic diseases: Secondary | ICD-10-CM | POA: Diagnosis not present

## 2016-10-02 DIAGNOSIS — M25511 Pain in right shoulder: Secondary | ICD-10-CM | POA: Diagnosis not present

## 2016-10-02 DIAGNOSIS — G8929 Other chronic pain: Secondary | ICD-10-CM | POA: Diagnosis not present

## 2016-10-02 DIAGNOSIS — Z1211 Encounter for screening for malignant neoplasm of colon: Secondary | ICD-10-CM

## 2016-10-02 DIAGNOSIS — Z7982 Long term (current) use of aspirin: Secondary | ICD-10-CM | POA: Diagnosis not present

## 2016-10-02 DIAGNOSIS — I10 Essential (primary) hypertension: Secondary | ICD-10-CM

## 2016-10-02 DIAGNOSIS — Z794 Long term (current) use of insulin: Secondary | ICD-10-CM | POA: Insufficient documentation

## 2016-10-02 DIAGNOSIS — K219 Gastro-esophageal reflux disease without esophagitis: Secondary | ICD-10-CM | POA: Insufficient documentation

## 2016-10-02 LAB — POCT GLYCOSYLATED HEMOGLOBIN (HGB A1C): HEMOGLOBIN A1C: 6.1

## 2016-10-02 LAB — GLUCOSE, POCT (MANUAL RESULT ENTRY): POC GLUCOSE: 84 mg/dL (ref 70–99)

## 2016-10-02 MED ORDER — ACCU-CHEK SOFTCLIX LANCET DEV MISC: 11 refills | Status: DC

## 2016-10-02 MED ORDER — INSULIN ASPART PROT & ASPART (70-30 MIX) 100 UNIT/ML PEN: 5.0000 [IU] | PEN_INJECTOR | Freq: Two times a day (BID) | SUBCUTANEOUS | 2 refills | Status: DC

## 2016-10-02 MED ORDER — PRAVASTATIN SODIUM 20 MG PO TABS: 20.0000 mg | ORAL_TABLET | Freq: Every day | ORAL | 3 refills | Status: DC

## 2016-10-02 MED ORDER — GABAPENTIN 300 MG PO CAPS: 300.0000 mg | ORAL_CAPSULE | Freq: Four times a day (QID) | ORAL | 2 refills | Status: DC

## 2016-10-02 MED ORDER — LISINOPRIL-HYDROCHLOROTHIAZIDE 10-12.5 MG PO TABS: 1.0000 | ORAL_TABLET | Freq: Every morning | ORAL | 3 refills | Status: DC

## 2016-10-02 MED ORDER — ASPIRIN EC 81 MG PO TBEC: 81.0000 mg | DELAYED_RELEASE_TABLET | Freq: Every day | ORAL | 3 refills | Status: DC

## 2016-10-02 MED ORDER — AMLODIPINE BESYLATE 5 MG PO TABS: 5.0000 mg | ORAL_TABLET | Freq: Every day | ORAL | 3 refills | Status: DC

## 2016-10-02 MED ORDER — ULTICARE MICRO PEN NEEDLES 32G X 4 MM MISC: 1.0000 | Freq: Two times a day (BID) | 11 refills | Status: DC

## 2016-10-02 NOTE — Progress Notes (Signed)
Patient is here for f/up DM  Patient complains about on both hands fingers get stuck when bend they cant get back up other than that no pain

## 2016-10-02 NOTE — Progress Notes (Signed)
Robert Sparks, is a 71 y.o. male  [id number]  [id number]  DOB - Nov 13, 1945  No chief complaint on file.       Subjective:   Robert Sparks is a 71 y.o. male here today for a follow up visit.  Last seen 06/21/16, w/ hx of prostate cancer T2b, sp radiation trx. Pt states he has been done w/ radiation for about 1 month now. Overall doing well, taking all his meds as prescribed. c/o of numerous fingers, fills "tight and gets stuck".  Also c/o of intermitent right shoulder pains as well, rom of motion limited due to pains. Does not sleep well due to pains in legs.  Per pt, told he has arthritis of his feet and has injections there prior as well.  Pt co of some rectal irritation at times w/ bm. He states he was told to use wipes rather than toilet paper. Of note, he saw gi, but deferred colonoscopy in past due to copay.    Patient has No headache, No chest pain, No abdominal pain - No Nausea, No new weakness tingling or numbness, No Cough - SOB.  No problems updated.  ALLERGIES: No Known Allergies  PAST MEDICAL HISTORY: Past Medical History:  Diagnosis Date  . Arthritis    shoulders  . CKD (chronic kidney disease), stage III   . Elevated PSA   . GERD (gastroesophageal reflux disease)   . History of ketoacidosis    03-29-2015   . History of sepsis    07-25-2013  non-compliant w/ medication  . Hypertension   . Nocturia   . Peripheral neuropathy ([place])   . Prostate cancer ([place])   . Type 2 diabetes mellitus with insulin therapy ([place])     MEDICATIONS AT HOME: Prior to Admission medications   Medication Sig Start Date End Date Taking? Authorizing Provider  amLODipine (NORVASC) 5 MG tablet Take 1 tablet (5 mg total) by mouth daily. 10/02/16   Maren Reamer, MD  aspirin EC 81 MG tablet Take 1 tablet (81 mg total) by mouth daily. 10/02/16   Maren Reamer, MD  Blood Glucose Monitoring Suppl (ACCU-CHEK AVIVA PLUS) w/Device KIT 1 application by Does not apply route 3  (three) times daily. 08/07/16   Maren Reamer, MD  gabapentin (NEURONTIN) 300 MG capsule Take 1 capsule (300 mg total) by mouth 4 (four) times daily. Take 1 tab po am and lunch, 2 tabs at night. 10/02/16   Maren Reamer, MD  glucose blood test strip Use as instructed 08/07/16   Maren Reamer, MD  insulin aspart protamine - aspart (NOVOLOG 70/30 MIX) (70-30) 100 UNIT/ML FlexPen Inject 0.05 mLs (5 Units total) into the skin 2 (two) times daily with a meal. 10/02/16   Maren Reamer, MD  Insulin Pen Needle (PEN NEEDLES 3/16") 31G X 5 MM MISC Use one needle twice a day as directed. 10/28/15   Veryl Speak, MD  Lancet Devices [facility]) lancets Use as instructed 10/02/16   Maren Reamer, MD  lisinopril-hydrochlorothiazide (PRINZIDE,ZESTORETIC) 10-12.5 MG tablet Take 1 tablet by mouth every morning. 10/02/16   Maren Reamer, MD  polyethylene glycol powder (GLYCOLAX/MIRALAX) powder TAKE AS DIRECTED FOR COLONOSCOPY PREP. 08/02/16   Amy S Esterwood, PA-C  pravastatin (PRAVACHOL) 20 MG tablet Take 1 tablet (20 mg total) by mouth daily. 10/02/16   Maren Reamer, MD  TRUEPLUS LANCETS 26G MISC 1 each by Does not apply route every 8 (eight) hours as needed. 11/01/15  Maren Reamer, MD  ULTICARE MICRO PEN NEEDLES 32G X 4 MM MISC Inject 1 applicator into the skin 2 (two) times daily. 10/02/16   Maren Reamer, MD     Objective:   Vitals:   10/02/16 1448  BP: 123/73  Pulse: 86  Temp: 98.3 F (36.8 C)  TempSrc: Oral  Weight: 159 lb 6.4 oz (72.3 kg)    Exam General appearance : Awake, alert, not in any distress. Speech Clear. Not toxic looking, pleasant. HEENT: Atraumatic and Normocephalic, pupils equally reactive to light. Neck: supple, no JVD. No cervical lymphadenopathy.  Chest:Good air entry bilaterally, no added sounds. CVS: S1 S2 regular, no murmurs/gallups or rubs. Abdomen: Bowel sounds active, Non tender and not distended with no gaurding, rigidity or  rebound. Extremities: B/L Lower Ext shows no edema, both legs are warm to touch rom intact bilat shoulders, but appears more still on, able to move all fingers but appears still on flexion of bilat ring fingers and right pinky. Not true trigger finger on exam Neurology: Awake alert, and oriented X 3, CN II-XII grossly intact, Non focal Skin:No Rash  Data Review Lab Results  Component Value Date   HGBA1C 6.5 06/21/2016   HGBA1C 10.0 11/01/2015   HGBA1C 10.5 (H) 03/30/2015    Depression screen PHQ 2/9 10/02/2016 08/15/2016 06/21/2016 02/23/2016 02/21/2016  Decreased Interest 2 0 0 0 0  Down, Depressed, Hopeless 0 0 0 0 0  PHQ - 2 Score 2 0 0 0 0  Altered sleeping 0 - - - -  Tired, decreased energy 3 - - - -  Change in appetite 3 - - - -  Feeling bad or failure about yourself  0 - - - -  Trouble concentrating 0 - - - -  Moving slowly or fidgety/restless 0 - - - -  Suicidal thoughts 0 - - - -  PHQ-9 Score 8 - - - -      Assessment & Plan   1. CKD stage 3 due to type 2 diabetes mellitus (HCC) - dm and htn well controlled currently. Hopefully can prevent progression of kidney disease longer. - Glucose (CBG) - Basic metabolic panel  2. Essential hypertension Well controlled Continue prinzide 10-12.5 qd and norvasc 5 qd - CBC with Differential  3. Trigger finger, unspecified finger, unspecified laterality - Ambulatory referral to Sports Medicine - injection?  4. Chronic right shoulder pain - Ambulatory referral to Sports Medicine - chk rf/ana/uric acid  5. Colon cancer screening Encourage to get screening - Ambulatory referral to Gastroenterology  6. T2b adenocarcinoma of the prostate, Gleason 4+3 and a PSA of 62    - sp curative radiation (05/02/16-06/28/16)  7. Irritation w/ bms.,   recd high fiber diet, using wipes rather than toilet paper, encouraged colonoscopy screening given risk for radiation proctitis.  Patient have been counseled extensively about nutrition and  exercise  Return in about 3 months (around 01/02/2017), or if symptoms worsen or fail to improve.  The patient was given clear instructions to go to ER or return to medical center if symptoms don't improve, worsen or new problems develop. The patient verbalized understanding. The patient was told to call to get lab results if they haven't heard anything in the next week.   This note has been created with Surveyor, quantity. Any transcriptional errors are unintentional.   Maren Reamer, MD, [place] [facility] [place], [place]   10/02/2016,  3:10 PM

## 2016-10-02 NOTE — Patient Instructions (Addendum)
- 4th Tuesday @230 -4pm, in our parking lot, tomorrow is next session  -  Health Maintenance, Male A healthy lifestyle and preventive care is important for your health and wellness. Ask your health care provider about what schedule of regular examinations is right for you. What should I know about weight and diet?  Eat a Healthy Diet  Eat plenty of vegetables, fruits, whole grains, low-fat dairy products, and lean protein.  Do not eat a lot of foods high in solid fats, added sugars, or salt. Maintain a Healthy Weight  Regular exercise can help you achieve or maintain a healthy weight. You should:  Do at least 150 minutes of exercise each week. The exercise should increase your heart rate and make you sweat (moderate-intensity exercise).  Do strength-training exercises at least twice a week. Watch Your Levels of Cholesterol and Blood Lipids  Have your blood tested for lipids and cholesterol every 5 years starting at 69 years of age. If you are at high risk for heart disease, you should start having your blood tested when you are 69 years old. You may need to have your cholesterol levels checked more often if:  Your lipid or cholesterol levels are high.  You are older than 69 years of age.  You are at high risk for heart disease. What should I know about cancer screening? Many types of cancers can be detected early and may often be prevented. Lung Cancer  You should be screened every year for lung cancer if:  You are a current smoker who has smoked for at least 30 years.  You are a former smoker who has quit within the past 15 years.  Talk to your health care provider about your screening options, when you should start screening, and how often you should be screened. Colorectal Cancer  Routine colorectal cancer screening usually begins at 69 years of age and should be repeated every 5-10 years until you are 69 years old. You may need to be screened more often if early forms of  precancerous polyps or small growths are found. Your health care provider may recommend screening at an earlier age if you have risk factors for colon cancer.  Your health care provider may recommend using home test kits to check for hidden blood in the stool.  A small camera at the end of a tube can be used to examine your colon (sigmoidoscopy or colonoscopy). This checks for the earliest forms of colorectal cancer. Prostate and Testicular Cancer  Depending on your age and overall health, your health care provider may do certain tests to screen for prostate and testicular cancer.  Talk to your health care provider about any symptoms or concerns you have about testicular or prostate cancer. Skin Cancer  Check your skin from head to toe regularly.  Tell your health care provider about any new moles or changes in moles, especially if:  There is a change in a mole's size, shape, or color.  You have a mole that is larger than a pencil eraser.  Always use sunscreen. Apply sunscreen liberally and repeat throughout the day.  Protect yourself by wearing long sleeves, pants, a wide-brimmed hat, and sunglasses when outside. What should I know about heart disease, diabetes, and high blood pressure?  If you are 73-23 years of age, have your blood pressure checked every 3-5 years. If you are 28 years of age or older, have your blood pressure checked every year. You should have your blood pressure measured twice-once when  you are at a hospital or clinic, and once when you are not at a hospital or clinic. Record the average of the two measurements. To check your blood pressure when you are not at a hospital or clinic, you can use:  An automated blood pressure machine at a pharmacy.  A home blood pressure monitor.  Talk to your health care provider about your target blood pressure.  If you are between 45-79 years old, ask your health care provider if you should take aspirin to prevent heart  disease.  Have regular diabetes screenings by checking your fasting blood sugar level.  If you are at a normal weight and have a low risk for diabetes, have this test once every three years after the age of 45.  If you are overweight and have a high risk for diabetes, consider being tested at a younger age or more often.  A one-time screening for abdominal aortic aneurysm (AAA) by ultrasound is recommended for men aged 65-75 years who are current or former smokers. What should I know about preventing infection? Hepatitis B  If you have a higher risk for hepatitis B, you should be screened for this virus. Talk with your health care provider to find out if you are at risk for hepatitis B infection. Hepatitis C  Blood testing is recommended for:  Everyone born from 1945 through 1965.  Anyone with known risk factors for hepatitis C. Sexually Transmitted Diseases (STDs)  You should be screened each year for STDs including gonorrhea and chlamydia if:  You are sexually active and are younger than 69 years of age.  You are older than 69 years of age and your health care provider tells you that you are at risk for this type of infection.  Your sexual activity has changed since you were last screened and you are at an increased risk for chlamydia or gonorrhea. Ask your health care provider if you are at risk.  Talk with your health care provider about whether you are at high risk of being infected with HIV. Your health care provider may recommend a prescription medicine to help prevent HIV infection. What else can I do?  Schedule regular health, dental, and eye exams.  Stay current with your vaccines (immunizations).  Do not use any tobacco products, such as cigarettes, chewing tobacco, and e-cigarettes. If you need help quitting, ask your health care provider.  Limit alcohol intake to no more than 2 drinks per day. One drink equals 12 ounces of beer, 5 ounces of wine, or 1 ounces of hard  liquor.  Do not use street drugs.  Do not share needles.  Ask your health care provider for help if you need support or information about quitting drugs.  Tell your health care provider if you often feel depressed.  Tell your health care provider if you have ever been abused or do not feel safe at home. This information is not intended to replace advice given to you by your health care provider. Make sure you discuss any questions you have with your health care provider. Document Released: 12/23/2007 Document Revised: 02/23/2016 Document Reviewed: 03/30/2015 Elsevier Interactive Patient Education  2017 [employer]vier Inc.  

## 2016-10-02 NOTE — Addendum Note (Signed)
Addended by: Lottie Mussel T on: 10/02/2016 03:24 PM   Modules accepted: Orders

## 2016-10-03 LAB — BASIC METABOLIC PANEL
BUN/Creatinine Ratio: 24 (ref 10–24)
BUN: 36 mg/dL — AB (ref 8–27)
CALCIUM: 10 mg/dL (ref 8.6–10.2)
CO2: 19 mmol/L (ref 18–29)
CREATININE: 1.48 mg/dL — AB (ref 0.76–1.27)
Chloride: 102 mmol/L (ref 96–106)
GFR calc Af Amer: 55 mL/min/{1.73_m2} — ABNORMAL LOW (ref >59)
GFR, EST NON AFRICAN AMERICAN: 48 mL/min/{1.73_m2} — AB (ref >59)
Glucose: 96 mg/dL (ref 65–99)
Potassium: 4.6 mmol/L (ref 3.5–5.2)
Sodium: 140 mmol/L (ref 134–144)

## 2016-10-03 LAB — CBC WITH DIFFERENTIAL/PLATELET
BASOS: 0 %
Basophils Absolute: 0 10*3/uL (ref 0.0–0.2)
EOS (ABSOLUTE): 1 10*3/uL — ABNORMAL HIGH (ref 0.0–0.4)
EOS: 20 %
HEMATOCRIT: 29.6 % — AB (ref 37.5–51.0)
HEMOGLOBIN: 10 g/dL — AB (ref 13.0–17.7)
IMMATURE GRANS (ABS): 0 10*3/uL (ref 0.0–0.1)
IMMATURE GRANULOCYTES: 0 %
LYMPHS: 17 %
Lymphocytes Absolute: 0.9 10*3/uL (ref 0.7–3.1)
MCH: 33.7 pg — ABNORMAL HIGH (ref 26.6–33.0)
MCHC: 33.8 g/dL (ref 31.5–35.7)
MCV: 100 fL — AB (ref 79–97)
MONOCYTES: 7 %
Monocytes Absolute: 0.3 10*3/uL (ref 0.1–0.9)
NEUTROS ABS: 2.8 10*3/uL (ref 1.4–7.0)
NEUTROS PCT: 56 %
Platelets: 227 10*3/uL (ref 150–379)
RBC: 2.97 x10E6/uL — ABNORMAL LOW (ref 4.14–5.80)
RDW: 14.1 % (ref 12.3–15.4)
WBC: 5.1 10*3/uL (ref 3.4–10.8)

## 2016-10-03 LAB — ANA: ANA TITER 1: NEGATIVE

## 2016-10-03 LAB — RHEUMATOID FACTOR: Rhuematoid fact SerPl-aCnc: <10 IU/mL (ref 0.0–13.9)

## 2016-10-03 LAB — URIC ACID: URIC ACID: 6.7 mg/dL (ref 3.7–8.6)

## 2016-10-04 ENCOUNTER — Other Ambulatory Visit: Payer: Self-pay | Admitting: Internal Medicine

## 2016-10-04 DIAGNOSIS — D539 Nutritional anemia, unspecified: Secondary | ICD-10-CM

## 2016-10-05 ENCOUNTER — Telehealth: Payer: Self-pay

## 2016-10-05 NOTE — Telephone Encounter (Signed)
Contacted pt to go over lab results pt is aware of results and doesn't have any questions or concerns 

## 2016-10-09 ENCOUNTER — Ambulatory Visit: Payer: Medicare HMO | Attending: Internal Medicine

## 2016-10-09 DIAGNOSIS — D539 Nutritional anemia, unspecified: Secondary | ICD-10-CM

## 2016-10-09 DIAGNOSIS — D638 Anemia in other chronic diseases classified elsewhere: Secondary | ICD-10-CM | POA: Diagnosis not present

## 2016-10-10 LAB — IRON AND TIBC
IRON: 101 ug/dL (ref 38–169)
Iron Saturation: 34 % (ref 15–55)
TIBC: 294 ug/dL (ref 250–450)
UIBC: 193 ug/dL (ref 111–343)

## 2016-10-10 LAB — FERRITIN: Ferritin: 679 ng/mL — ABNORMAL HIGH (ref 30–400)

## 2016-10-10 LAB — VITAMIN B12: VITAMIN B 12: 271 pg/mL (ref 232–1245)

## 2016-10-10 LAB — FOLATE: FOLATE: 9.2 ng/mL (ref >3.0)

## 2016-10-10 NOTE — Progress Notes (Signed)
Patient here for lab visit only 

## 2016-10-16 MED FILL — AMLODIPINE BESYLATE 5 MG TA: 5 | 30 days supply | Qty: 30 | Fill #0

## 2016-10-16 MED FILL — NOVOLOG MIX 70-30 FLEXPEN S: (70-30) 100 | 14 days supply | Qty: 3 | Fill #0

## 2016-10-16 MED FILL — GABAPENTIN 300 MG CAPSULE: 300 | 30 days supply | Qty: 120 | Fill #0

## 2016-10-16 MED FILL — PRAVASTATIN NA 20 MG TAB: 20 | 30 days supply | Qty: 30 | Fill #0

## 2016-10-16 MED FILL — LISINOPRIL-HCTZ 10-12.5 MG: 10-12.5 | 30 days supply | Qty: 30 | Fill #0

## 2016-10-17 MED FILL — ACCU-CHEK AVIVA PLUS TEST S: 25 days supply | Qty: 100 | Fill #1

## 2016-10-18 ENCOUNTER — Telehealth: Payer: Self-pay

## 2016-10-18 NOTE — Telephone Encounter (Signed)
Contacted pt to go over lab results pt didn't answer lvm asking pt to give me a call at his earliest convenience   If pt calls back please give results: Labs point towards chronic anemia due to his kidney disease most likely.

## 2016-10-20 DIAGNOSIS — C61 Malignant neoplasm of prostate: Secondary | ICD-10-CM | POA: Diagnosis not present

## 2016-10-23 NOTE — Telephone Encounter (Signed)
Unable to bill cancel fee due to his [employer]

## 2016-10-30 ENCOUNTER — Encounter: Payer: Self-pay | Admitting: Podiatry

## 2016-10-30 ENCOUNTER — Ambulatory Visit (INDEPENDENT_AMBULATORY_CARE_PROVIDER_SITE_OTHER): Payer: Medicare HMO | Admitting: Podiatry

## 2016-10-30 DIAGNOSIS — L851 Acquired keratosis [keratoderma] palmaris et plantaris: Secondary | ICD-10-CM | POA: Diagnosis not present

## 2016-10-30 DIAGNOSIS — E0842 Diabetes mellitus due to underlying condition with diabetic polyneuropathy: Secondary | ICD-10-CM | POA: Diagnosis not present

## 2016-10-30 DIAGNOSIS — L608 Other nail disorders: Secondary | ICD-10-CM

## 2016-10-30 DIAGNOSIS — B351 Tinea unguium: Secondary | ICD-10-CM

## 2016-10-30 DIAGNOSIS — M7750 Other enthesopathy of unspecified foot: Secondary | ICD-10-CM

## 2016-10-30 DIAGNOSIS — M19079 Primary osteoarthritis, unspecified ankle and foot: Secondary | ICD-10-CM | POA: Diagnosis not present

## 2016-10-30 DIAGNOSIS — M79672 Pain in left foot: Secondary | ICD-10-CM

## 2016-10-30 DIAGNOSIS — M779 Enthesopathy, unspecified: Secondary | ICD-10-CM

## 2016-10-30 DIAGNOSIS — L603 Nail dystrophy: Secondary | ICD-10-CM | POA: Diagnosis not present

## 2016-10-30 DIAGNOSIS — M778 Other enthesopathies, not elsewhere classified: Secondary | ICD-10-CM

## 2016-10-30 DIAGNOSIS — M79609 Pain in unspecified limb: Secondary | ICD-10-CM

## 2016-10-30 DIAGNOSIS — M79671 Pain in right foot: Secondary | ICD-10-CM

## 2016-10-30 DIAGNOSIS — L84 Corns and callosities: Secondary | ICD-10-CM

## 2016-10-30 MED ORDER — NONFORMULARY OR COMPOUNDED ITEM: 0 refills | Status: DC

## 2016-10-31 NOTE — Progress Notes (Signed)
Patient ID: Robert Sparks, male   DOB: 1945-08-03, 71 y.o.   MRN: [id number]   SUBJECTIVE Patient with a history of diabetes mellitus presents to office today complaining of elongated, thickened nails. Pain while ambulating in shoes. Patient is unable to trim their own nails. Patient also presents with painful callus lesions to the bilateral feet on the weightbearing surfaces.  No Known Allergies  OBJECTIVE General Patient is awake, alert, and oriented x 3 and in no acute distress. Derm painful hyperkeratotic skin lesions noted to the plantar aspect of the right great toe as well as the left great toe. Skin is dry and supple bilateral. Negative open lesions or macerations. Remaining integument unremarkable. Nails are tender, long, thickened and dystrophic with subungual debris, consistent with onychomycosis, 1-5 bilateral. No signs of infection noted. Vasc  DP and PT pedal pulses palpable bilaterally. Temperature gradient within normal limits.  Neuro Epicritic and protective threshold sensation diminished bilaterally.  Musculoskeletal Exam pain on palpation to the midfoot, Lisfranc joint, across the bilateral feet. Pain also noted with dorsiflexion of the right great toe consistent with a tendinitis. No symptomatic pedal deformities noted bilateral. Muscular strength within normal limits.  ASSESSMENT 1. Diabetes Mellitus w/ peripheral neuropathy 2. Onychomycosis of nail due to dermatophyte bilateral 3. Pain in foot bilateral 4. Midfoot arthritis bilateral 5. Porokeratosis bilateral great toes  PLAN OF CARE 1. Patient evaluated today. 2. Instructed to maintain good pedal hygiene and foot care. Stressed importance of controlling blood sugar.  3. Mechanical debridement of nails 1-5 bilaterally performed using a nail nipper. Filed with dremel without incident.  4. Excisional debridement of porokeratotic lesions was performed to the bilateral great toes using a chisel blade without incident. 4.  Injection of 0.5 mL Celestone Soluspan injected into the Lisfranc joint/mid foot arthritis of the bilateral feet. 5. Return to clinic in 3 mos.    Edrick Kins, DPM

## 2016-11-05 MED ORDER — BETAMETHASONE SOD PHOS & ACET 6 (3-3) MG/ML IJ SUSP: 12.0000 mg | Freq: Once | INTRAMUSCULAR | Status: DC

## 2016-11-06 MED FILL — NOVOLOG MIX 70-30 FLEXPEN S: (70-30) 100 | 14 days supply | Qty: 3 | Fill #1

## 2016-11-08 MED FILL — PRAVASTATIN NA 20 MG TAB: 20 | 30 days supply | Qty: 30 | Fill #1

## 2016-11-08 MED FILL — ACCU-CHEK AVIVA PLUS TEST S: 15 days supply | Qty: 50 | Fill #2

## 2016-11-08 MED FILL — AMLODIPINE BESYLATE 5 MG TA: 5 | 30 days supply | Qty: 30 | Fill #1

## 2016-11-08 MED FILL — LISINOPRIL-HCTZ 10-12.5 MG: 10-12.5 | 30 days supply | Qty: 30 | Fill #1

## 2016-11-08 MED FILL — GABAPENTIN 300 MG CAPSULE: 300 | 30 days supply | Qty: 120 | Fill #1

## 2016-11-10 ENCOUNTER — Observation Stay (HOSPITAL_COMMUNITY)
Admission: EM | Admit: 2016-11-10 | Discharge: 2016-11-11 | Disposition: A | Discharge status: Home / Self Care | Payer: Medicare HMO | Attending: Internal Medicine | Admitting: Internal Medicine

## 2016-11-10 ENCOUNTER — Encounter (HOSPITAL_COMMUNITY): Payer: Self-pay

## 2016-11-10 DIAGNOSIS — N189 Chronic kidney disease, unspecified: Secondary | ICD-10-CM | POA: Diagnosis not present

## 2016-11-10 DIAGNOSIS — Z79899 Other long term (current) drug therapy: Secondary | ICD-10-CM | POA: Insufficient documentation

## 2016-11-10 DIAGNOSIS — E1122 Type 2 diabetes mellitus with diabetic chronic kidney disease: Secondary | ICD-10-CM | POA: Insufficient documentation

## 2016-11-10 DIAGNOSIS — N183 Chronic kidney disease, stage 3 (moderate): Secondary | ICD-10-CM | POA: Diagnosis not present

## 2016-11-10 DIAGNOSIS — D509 Iron deficiency anemia, unspecified: Secondary | ICD-10-CM | POA: Diagnosis not present

## 2016-11-10 DIAGNOSIS — E785 Hyperlipidemia, unspecified: Secondary | ICD-10-CM | POA: Insufficient documentation

## 2016-11-10 DIAGNOSIS — Z8546 Personal history of malignant neoplasm of prostate: Secondary | ICD-10-CM | POA: Insufficient documentation

## 2016-11-10 DIAGNOSIS — D649 Anemia, unspecified: Secondary | ICD-10-CM

## 2016-11-10 DIAGNOSIS — F1020 Alcohol dependence, uncomplicated: Secondary | ICD-10-CM | POA: Insufficient documentation

## 2016-11-10 DIAGNOSIS — I129 Hypertensive chronic kidney disease with stage 1 through stage 4 chronic kidney disease, or unspecified chronic kidney disease: Secondary | ICD-10-CM | POA: Diagnosis not present

## 2016-11-10 DIAGNOSIS — Z794 Long term (current) use of insulin: Secondary | ICD-10-CM | POA: Diagnosis not present

## 2016-11-10 DIAGNOSIS — Z7982 Long term (current) use of aspirin: Secondary | ICD-10-CM | POA: Diagnosis not present

## 2016-11-10 DIAGNOSIS — F17210 Nicotine dependence, cigarettes, uncomplicated: Secondary | ICD-10-CM | POA: Diagnosis not present

## 2016-11-10 DIAGNOSIS — E1110 Type 2 diabetes mellitus with ketoacidosis without coma: Secondary | ICD-10-CM | POA: Insufficient documentation

## 2016-11-10 DIAGNOSIS — N179 Acute kidney failure, unspecified: Secondary | ICD-10-CM | POA: Insufficient documentation

## 2016-11-10 DIAGNOSIS — K219 Gastro-esophageal reflux disease without esophagitis: Secondary | ICD-10-CM | POA: Diagnosis not present

## 2016-11-10 DIAGNOSIS — R42 Dizziness and giddiness: Secondary | ICD-10-CM

## 2016-11-10 DIAGNOSIS — Z923 Personal history of irradiation: Secondary | ICD-10-CM | POA: Diagnosis not present

## 2016-11-10 DIAGNOSIS — I951 Orthostatic hypotension: Secondary | ICD-10-CM | POA: Diagnosis not present

## 2016-11-10 DIAGNOSIS — E1142 Type 2 diabetes mellitus with diabetic polyneuropathy: Secondary | ICD-10-CM | POA: Diagnosis not present

## 2016-11-10 LAB — BASIC METABOLIC PANEL
Anion gap: 10 (ref 5–15)
Anion gap: 10 (ref 5–15)
BUN: 50 mg/dL — ABNORMAL HIGH (ref 6–20)
BUN: 47 mg/dL — ABNORMAL HIGH (ref 6–20)
CHLORIDE: 107 mmol/L (ref 101–111)
CO2: 17 mmol/L — AB (ref 22–32)
CO2: 17 mmol/L — ABNORMAL LOW (ref 22–32)
CREATININE: 2.56 mg/dL — AB (ref 0.61–1.24)
CREATININE: 2.1 mg/dL — AB (ref 0.61–1.24)
Calcium: 9.1 mg/dL (ref 8.9–10.3)
Calcium: 8.5 mg/dL — ABNORMAL LOW (ref 8.9–10.3)
Chloride: 105 mmol/L (ref 101–111)
GFR calc Af Amer: 28 mL/min — ABNORMAL LOW (ref >60)
GFR calc non Af Amer: 31 mL/min — ABNORMAL LOW (ref >60)
GFR, EST AFRICAN AMERICAN: 36 mL/min — AB (ref >60)
GFR, EST NON AFRICAN AMERICAN: 24 mL/min — AB (ref >60)
GLUCOSE: 153 mg/dL — AB (ref 65–99)
Glucose, Bld: 187 mg/dL — ABNORMAL HIGH (ref 65–99)
Potassium: 5.3 mmol/L — ABNORMAL HIGH (ref 3.5–5.1)
Potassium: 4.8 mmol/L (ref 3.5–5.1)
Sodium: 132 mmol/L — ABNORMAL LOW (ref 135–145)
Sodium: 134 mmol/L — ABNORMAL LOW (ref 135–145)

## 2016-11-10 LAB — RETICULOCYTES
RBC.: 2.36 MIL/uL — AB (ref 4.22–5.81)
RETIC CT PCT: 2.1 % (ref 0.4–3.1)
Retic Count, Absolute: 49.6 10*3/uL (ref 19.0–186.0)

## 2016-11-10 LAB — HEPATIC FUNCTION PANEL
ALBUMIN: 3.4 g/dL — AB (ref 3.5–5.0)
ALK PHOS: 82 U/L (ref 38–126)
ALT: 14 U/L — ABNORMAL LOW (ref 17–63)
AST: 18 U/L (ref 15–41)
BILIRUBIN TOTAL: 0.5 mg/dL (ref 0.3–1.2)
Total Protein: 6.3 g/dL — ABNORMAL LOW (ref 6.5–8.1)

## 2016-11-10 LAB — URINALYSIS, ROUTINE W REFLEX MICROSCOPIC
BILIRUBIN URINE: NEGATIVE
Glucose, UA: NEGATIVE mg/dL
HGB URINE DIPSTICK: NEGATIVE
Ketones, ur: NEGATIVE mg/dL
Leukocytes, UA: NEGATIVE
Nitrite: NEGATIVE
Protein, ur: NEGATIVE mg/dL
SPECIFIC GRAVITY, URINE: 1.006 (ref 1.005–1.030)
pH: 5 (ref 5.0–8.0)

## 2016-11-10 LAB — TYPE AND SCREEN
ABO/RH(D): O POS
Antibody Screen: NEGATIVE

## 2016-11-10 LAB — FERRITIN: FERRITIN: 476 ng/mL — AB (ref 24–336)

## 2016-11-10 LAB — CBC
HCT: 23.7 % — ABNORMAL LOW (ref 39.0–52.0)
Hemoglobin: 8.1 g/dL — ABNORMAL LOW (ref 13.0–17.0)
MCH: 33.9 pg (ref 26.0–34.0)
MCHC: 34.2 g/dL (ref 30.0–36.0)
MCV: 99.2 fL (ref 78.0–100.0)
PLATELETS: 201 10*3/uL (ref 150–400)
RBC: 2.39 MIL/uL — ABNORMAL LOW (ref 4.22–5.81)
RDW: 14.3 % (ref 11.5–15.5)
WBC: 4.7 10*3/uL (ref 4.0–10.5)

## 2016-11-10 LAB — IRON AND TIBC
Iron: 43 ug/dL — ABNORMAL LOW (ref 45–182)
SATURATION RATIOS: 16 % — AB (ref 17.9–39.5)
TIBC: 272 ug/dL (ref 250–450)
UIBC: 229 ug/dL

## 2016-11-10 LAB — POC OCCULT BLOOD, ED: Fecal Occult Bld: NEGATIVE

## 2016-11-10 LAB — ABO/RH: ABO/RH(D): O POS

## 2016-11-10 LAB — GLUCOSE, CAPILLARY
GLUCOSE-CAPILLARY: 117 mg/dL — AB (ref 65–99)
Glucose-Capillary: 153 mg/dL — ABNORMAL HIGH (ref 65–99)

## 2016-11-10 LAB — VITAMIN B12: Vitamin B-12: 199 pg/mL (ref 180–914)

## 2016-11-10 LAB — FOLATE: FOLATE: 16.4 ng/mL (ref >5.9)

## 2016-11-10 LAB — TSH: TSH: 1.569 u[IU]/mL (ref 0.350–4.500)

## 2016-11-10 MED ORDER — LORAZEPAM 1 MG PO TABS: 1.0000 mg | ORAL_TABLET | Freq: Four times a day (QID) | ORAL | Status: DC | PRN

## 2016-11-10 MED ORDER — SODIUM CHLORIDE 0.9 % IV SOLN
INTRAVENOUS | Status: AC
  Administered 2016-11-10: 22:00:00 via INTRAVENOUS

## 2016-11-10 MED ORDER — ENOXAPARIN SODIUM 30 MG/0.3ML ~~LOC~~ SOLN
30.0000 mg | SUBCUTANEOUS | Status: DC
  Administered 2016-11-10: 30 mg via SUBCUTANEOUS
  Filled 2016-11-10: qty 0.3

## 2016-11-10 MED ORDER — ACETAMINOPHEN 80 MG PO CHEW
320.0000 mg | CHEWABLE_TABLET | Freq: Four times a day (QID) | ORAL | Status: DC | PRN
  Administered 2016-11-10: 320 mg via ORAL
  Filled 2016-11-10 (×2): qty 4

## 2016-11-10 MED ORDER — VITAMIN B-1 100 MG PO TABS
100.0000 mg | ORAL_TABLET | Freq: Every day | ORAL | Status: DC
  Administered 2016-11-10 – 2016-11-11 (×2): 100 mg via ORAL
  Filled 2016-11-10 (×2): qty 1

## 2016-11-10 MED ORDER — ADULT MULTIVITAMIN W/MINERALS CH
1.0000 | ORAL_TABLET | Freq: Every day | ORAL | Status: DC
  Administered 2016-11-10 – 2016-11-11 (×2): 1 via ORAL
  Filled 2016-11-10 (×2): qty 1

## 2016-11-10 MED ORDER — INSULIN ASPART PROT & ASPART (70-30 MIX) 100 UNIT/ML ~~LOC~~ SUSP
5.0000 [IU] | Freq: Two times a day (BID) | SUBCUTANEOUS | Status: DC
  Administered 2016-11-10 – 2016-11-11 (×2): 5 [IU] via SUBCUTANEOUS
  Filled 2016-11-10 (×2): qty 10

## 2016-11-10 MED ORDER — INSULIN ASPART PROT & ASPART (70-30 MIX) 100 UNIT/ML PEN
5.0000 [IU] | PEN_INJECTOR | Freq: Two times a day (BID) | SUBCUTANEOUS | Status: DC
  Filled 2016-11-10: qty 3

## 2016-11-10 MED ORDER — FOLIC ACID 1 MG PO TABS
1.0000 mg | ORAL_TABLET | Freq: Every day | ORAL | Status: DC
  Administered 2016-11-10 – 2016-11-11 (×2): 1 mg via ORAL
  Filled 2016-11-10 (×2): qty 1

## 2016-11-10 MED ORDER — THIAMINE HCL 100 MG/ML IJ SOLN: 100.0000 mg | Freq: Every day | INTRAMUSCULAR | Status: DC

## 2016-11-10 MED ORDER — INSULIN ASPART 100 UNIT/ML ~~LOC~~ SOLN
0.0000 [IU] | Freq: Three times a day (TID) | SUBCUTANEOUS | Status: DC
  Administered 2016-11-11: 2 [IU] via SUBCUTANEOUS

## 2016-11-10 MED ORDER — ASPIRIN EC 81 MG PO TBEC: 81.0000 mg | DELAYED_RELEASE_TABLET | Freq: Every day | ORAL | Status: DC

## 2016-11-10 MED ORDER — SENNOSIDES-DOCUSATE SODIUM 8.6-50 MG PO TABS
2.0000 | ORAL_TABLET | Freq: Two times a day (BID) | ORAL | Status: DC
  Administered 2016-11-10 – 2016-11-11 (×2): 2 via ORAL
  Filled 2016-11-10 (×2): qty 2

## 2016-11-10 MED ORDER — PRAVASTATIN SODIUM 40 MG PO TABS
20.0000 mg | ORAL_TABLET | Freq: Every day | ORAL | Status: DC
  Administered 2016-11-11: 20 mg via ORAL
  Filled 2016-11-10: qty 1

## 2016-11-10 MED ORDER — LORAZEPAM 2 MG/ML IJ SOLN: 1.0000 mg | Freq: Four times a day (QID) | INTRAMUSCULAR | Status: DC | PRN

## 2016-11-10 MED ORDER — ASPIRIN EC 325 MG PO TBEC
325.0000 mg | DELAYED_RELEASE_TABLET | Freq: Every day | ORAL | Status: DC
  Administered 2016-11-11: 325 mg via ORAL
  Filled 2016-11-10: qty 1

## 2016-11-10 MED ORDER — SODIUM CHLORIDE 0.9 % IV BOLUS (SEPSIS)
1000.0000 mL | Freq: Once | INTRAVENOUS | Status: AC
  Administered 2016-11-10: 1000 mL via INTRAVENOUS

## 2016-11-10 MED ORDER — SODIUM CHLORIDE 0.9 % IV BOLUS (SEPSIS)
500.0000 mL | Freq: Once | INTRAVENOUS | Status: AC
  Administered 2016-11-10: 500 mL via INTRAVENOUS

## 2016-11-10 NOTE — H&P (Signed)
Date: 11/10/2016               Patient Name:  Robert Sparks MRN: [id number]  DOB: Dec 31, 1945 Age / Sex: 71 y.o., male   PCP: Maren Reamer, MD              Medical Service: Internal Medicine Teaching Service              Attending Physician: Dr. Bartholomew Crews, MD    First Contact: Jose Persia, MS 3    Second Contact: Dr. Lorella Nimrod    Third Contact Dr. Shela Leff         After Hours (After 5p/  First Contact Pager: [phone]  weekends / holidays): Second Contact Pager: [phone]   Chief Complaint: Lightheadedness   History of Present Illness: Robert Sparks is a 71 yo male with a PMHx significant for CKD (stage III), DM, HTN, hyperlipidemia, prostate cancer, and EtOH abuse who presents to the ED complaining of lightheadedness.  The patient states that he has been feeling lightheaded for a couple of months now.  The light headedness is most pronounced when he is standing up and is normal when he is sitting or lying down.  He does notice some lightheadedness when he is walking but only intermittently anmd is able to walk a half a mile without dyspnea or chest pain.  He does have some blurry vision as well that seems to be associated with the lightheadedness.  The patient also has some minor constipation and foul smelling stools but has no abdominal pain.  He has not noticed any weight loss, cognitive impairment, or recent infections.  He denies any dysuria or hematuria.  He currently reports that he is trying to be sober but drinks multiple 40s per day.  He reports no urinary symptoms.    Meds: Current Facility-Administered Medications  Medication Dose Route Frequency Provider Last Rate Last Dose  . betamethasone acetate-betamethasone sodium phosphate (CELESTONE) injection 12 mg  12 mg Intramuscular Once Edrick Kins, DPM      . betamethasone acetate-betamethasone sodium phosphate (CELESTONE) injection 3 mg  3 mg Intramuscular Once Edrick Kins, DPM       Current  Outpatient Prescriptions  Medication Sig Dispense Refill  . amLODipine (NORVASC) 5 MG tablet Take 1 tablet (5 mg total) by mouth daily. 90 tablet 3  . aspirin EC 81 MG tablet Take 1 tablet (81 mg total) by mouth daily. 90 tablet 3  . Blood Glucose Monitoring Suppl (ACCU-CHEK AVIVA PLUS) w/Device KIT 1 application by Does not apply route 3 (three) times daily. 1 kit 0  . gabapentin (NEURONTIN) 300 MG capsule Take 1 capsule (300 mg total) by mouth 4 (four) times daily. Take 1 tab po am and lunch, 2 tabs at night. 120 capsule 2  . glucose blood test strip Use as instructed 100 each 12  . insulin aspart protamine - aspart (NOVOLOG 70/30 MIX) (70-30) 100 UNIT/ML FlexPen Inject 0.05 mLs (5 Units total) into the skin 2 (two) times daily with a meal. 6 mL 2  . Insulin Pen Needle (PEN NEEDLES 3/16") 31G X 5 MM MISC Use one needle twice a day as directed. 30 each 6  . Lancet Devices (ACCU-CHEK SOFTCLIX) lancets Use as instructed 1 each 11  . lisinopril-hydrochlorothiazide (PRINZIDE,ZESTORETIC) 10-12.5 MG tablet Take 1 tablet by mouth every morning. 90 tablet 3  . NONFORMULARY OR COMPOUNDED ITEM [facility]  [place] Tendonitis Cream- Diclofenac 3%, Baclofen 2%, Bupivacaine 1%,  Doxepin 5%, Gabapentin 6%, Ibuprofen 3%, Pentoxifylline 3% Apply 1-2 grams to affected area 3-4 times daily Qty. 120 gm 3 refills 1 each 0  . polyethylene glycol powder (GLYCOLAX/MIRALAX) powder TAKE AS DIRECTED FOR COLONOSCOPY PREP. 255 g 0  . pravastatin (PRAVACHOL) 20 MG tablet Take 1 tablet (20 mg total) by mouth daily. 90 tablet 3  . TRUEPLUS LANCETS 26G MISC 1 each by Does not apply route every 8 (eight) hours as needed. 100 each 12  . ULTICARE MICRO PEN NEEDLES 32G X 4 MM MISC Inject 1 applicator into the skin 2 (two) times daily. 100 each 11    Allergies: Allergies as of 11/10/2016  . (No Known Allergies)   Past Medical History:  Diagnosis Date  . Arthritis    shoulders  . CKD (chronic kidney disease), stage  III   . Elevated PSA   . GERD (gastroesophageal reflux disease)   . History of ketoacidosis    03-29-2015   . History of sepsis    07-25-2013  non-compliant w/ medication  . Hypertension   . Nocturia   . Peripheral neuropathy   . Prostate cancer ([place])   . Type 2 diabetes mellitus with insulin therapy [facility])    Past Surgical History:  Procedure Laterality Date  . CIRCUMCISION  1990's  . PROSTATE BIOPSY N/A 12/21/2015   Procedure: BIOPSY TRANSRECTAL ULTRASONIC PROSTATE (TUBP);  Surgeon: Festus Aloe, MD;  Location: [facility];  Service: Urology;  Laterality: N/A;   Family History  Problem Relation Age of Onset  . Cancer Neg Hx   . Colon cancer Neg Hx    Social History   Social History  . Marital status: Single    Spouse name: N/A  . Number of children: N/A  . Years of education: N/A   Occupational History  . Not on file.   Social History Main Topics  . Smoking status: Current Every Day Smoker    Packs/day: 0.25    Years: 50.00    Types: Cigarettes  . Smokeless tobacco: Never Used     Comment: 1 ppwk  . Alcohol use YES     Comment:"a couple of 40s every day"  . Drug use: No  . Sexual activity: Not Currently   Other Topics Concern  . Not on file   Social History Narrative  . No narrative on file    Review of Systems: Pertinent items noted in HPI and remainder of comprehensive ROS otherwise negative.  Orthostatic VS for the past 24 hrs:  BP- Lying Pulse- Lying BP- Sitting Pulse- Sitting BP- Standing at 0 minutes Pulse- Standing at 0 minutes  11/10/16 1228 110/74 83 105/72 86 (!) 87/69 85    Physical Exam: Blood pressure 131/77, pulse 79, temperature 98.4 F (36.9 C), temperature source Oral, resp. rate 20, SpO2 100 %. General appearance: alert, cooperative and no distress Eyes: positive findings: mild conjuctival pallor Throat: normal findings: lips normal without lesions, teeth intact, non-carious and tongue midline and normal Lungs:  clear to auscultation bilaterally Heart: regular rate and rhythm, S1, S2 normal, no murmur, click, rub or gallop Abdomen: soft, non-tender; bowel sounds normal; no masses,  no organomegaly Extremities: extremities normal, atraumatic, no cyanosis or edema Skin: Skin color, texture, turgor normal. No rashes or lesions Neurologic: Grossly normal MSK: trigger finger on ring finger bilaterally   Lab results: Labs. CBC Latest Ref Rng & Units 11/10/2016 10/02/2016 12/21/2015  WBC 4.0 - 10.5 K/uL 4.7 5.1 -  Hemoglobin 13.0 - 17.0  g/dL 8.1(L) - 15.0  Hematocrit 39.0 - 52.0 % 23.7(L) 29.6(L) 44.0  Platelets 150 - 400 K/uL 201 227 -   CMP Latest Ref Rng & Units 11/10/2016 10/02/2016 06/21/2016  Glucose 65 - 99 mg/dL 153(H) 96 127(H)  BUN 6 - 20 mg/dL 50(H) 36(H) 31(H)  Creatinine 0.61 - 1.24 mg/dL 2.56(H) 1.48(H) 1.68(H)  Sodium 135 - 145 mmol/L 132(L) 140 139  Potassium 3.5 - 5.1 mmol/L 5.3(H) 4.6 4.9  Chloride 101 - 111 mmol/L 105 102 107  CO2 22 - 32 mmol/L 17(L) 19 17(L)  Calcium 8.9 - 10.3 mg/dL 9.1 10.0 9.4  Total Protein 6.5 - 8.1 g/dL 6.3(L) - -  Total Bilirubin 0.3 - 1.2 mg/dL 0.5 - -  Alkaline Phos 38 - 126 U/L 82 - -  AST 15 - 41 U/L 18 - -  ALT 17 - 63 U/L 14(L) - -   Urinalysis Labs(Brief)          Component Value Date/Time   COLORURINE STRAW (A) 11/10/2016 1528   APPEARANCEUR CLEAR 11/10/2016 1528   LABSPEC 1.006 11/10/2016 1528   PHURINE 5.0 11/10/2016 1528   GLUCOSEU NEGATIVE 11/10/2016 1528   HGBUR NEGATIVE 11/10/2016 1528   BILIRUBINUR NEGATIVE 11/10/2016 1528   KETONESUR NEGATIVE 11/10/2016 1528   PROTEINUR NEGATIVE 11/10/2016 1528   UROBILINOGEN 1.0 03/29/2015 1941   NITRITE NEGATIVE 11/10/2016 1528   LEUKOCYTESUR NEGATIVE 11/10/2016 1528     Iron/Anemia Profile:   Ref. Range 11/10/2016 14:50  Iron Latest Ref Range: 45 - 182 ug/dL 43 (L)  UIBC Latest Units: ug/dL 229  TIBC Latest Ref Range: 250 - 450 ug/dL 272  Saturation Ratios Latest Ref Range:  17.9 - 39.5 % 16 (L)  Ferritin Latest Ref Range: 24 - 336 ng/mL 476 (H)  Folate Latest Ref Range: >5.9 ng/mL 16.4   Reticulocytes  Order: [id number]  Status:  Final result Visible to patient:  No (Not Released) Next appt:  11/14/2016 at 09:00 AM in Gastroenterology ([facility] Previsit)   Ref Range & Units 14:50  Retic Ct Pct 0.4 - 3.1 % 2.1   RBC. 4.22 - 5.81 MIL/uL 2.36    Retic Count, Manual 19.0 - 186.0 K/uL 49.6        Folate. 16.4 B12. 199  Assessment & Plan by Problem: Active Problems:   Lightheadedness  Mr. Millea is a 71 yo male with a PMHx significant for CKD (stage III), DM, HTN, hyperlipidemia, prostate cancer, and EtOH abuse who presents to the ED complaining of lightheadedness.   Anemia: The patients Hg dropped to 8.1 down from 10 back in March, and 15 back in June of 2017.  The patients has multiple problems that could explain his anemia including low EPO due to ESRD, anemia of chronic disease due to ESRD or prostate cancer, malnutrition due to alcohol dependence,  or a myelodysplasia 2/2 his radiation treatment from his prostate cancer.  While he has not reported any stool in his blood, and the FOBT was negative, we should still consider GI bleeding as a cause of anemia.    Plan: rule out GI bleed with colonoscopy, refer to nephrology and consider supplemental EPO,  Perform CBC with diff to rule out hematologic malignancy, iron replacement therapy to treat possible malnutrition  Lightheadedness/orthostatic hypotension: The patient has multiple current problems that could explain his lightheadedness, including his anemia, EtOH dependence, DM causing 2/2 autonomic dysfunction, or chronic dehydration.   Since there was no concurrent increase in HR when his BP dropped  when standing, there is a greater concern for autonomic dysfunction. He also appeared dry and only reported drinking a couple of mugs of water and takes a thiazide regularly, so dehydration is a possibility.    Plan: IV NS overnight and repeat orthostatic vitals in the AM to see if it is due to dehydration.  CKD: The patient had CKD stage 3 prior to admission but GFR of 28 places the patient at stage 4 now.  He currently is not seeing a nephrologist for his condition.  Plan: repeat BMP in the morning after fluid resuscitation, [place] neph[facility] for follow up.    EtOH abuse:  The patient reports drinking multiple 40s a couple of times a day, making [place] concerned for possible DT in the hosptial.  Plan: CIWA protocol   History of prostate cancer:  He completed his radiation in December 2017. He has no current symptoms. Plan: Make sure he is following up with his radiation oncology  Diabetes: His A1C was 6.1 back in march which is similar to his A1C back in December at 6.5  It seems he is able to control his DM well on his own.   Plan: continue to monitor BG and keep him on his Novolog (insulin aspart) 70/30 while admitted (takes 5 units twice a day at home)  This is a Careers information officer Note.  The care of the patient was discussed with Dr. Lynnae January and the assessment and plan was formulated with their assistance.  Please see their note for official documentation of the patient encounter.   Signed: Brendolyn Patty, Medical Student 11/10/2016, 2:58 PM

## 2016-11-10 NOTE — ED Triage Notes (Signed)
Pt presents for evaluation of ongoing intermittent dizziness. Pt reports PCP recently changed HTN meds to help dizziness but no improvement. Pt AxO x4, ambulatory.

## 2016-11-10 NOTE — H&P (Signed)
Date: 11/10/2016               Patient Name:  Robert Sparks MRN: [id number]  DOB: [birth year] Age / Sex: 71 y.o., male   PCP: Maren Reamer, MD         Medical Service: Internal Medicine Teaching Service         Attending Physician: Dr. Bartholomew Crews, MD    First Contact: Dr. Reesa Chew Pager: [phone]  Second Contact: Dr. Marlowe Sax Pager: [phone]       After Hours (After 5p/  First Contact Pager: [phone]  weekends / holidays): Second Contact Pager: [phone]   Chief Complaint:  Lightheadedness and generalized weakness.  History of Present Illness: Robert Sparks is a 71 y.o. man with PMHx significant for CKD stage III, type 2 diabetes with neuropathy, hypertension and prostate cancer T2b, sp radiation trx completed in December 2017 Came to ED with complaint of lightheadedness and generalized weakness going on for a few months.  According to patient he was experiencing generalized weakness and lightheadedness with standing and occasionally with walking which last for a few minutes. He denies any exertional dyspnea, was able to walk half a mile without any difficulty, denies chest pain, palpitations, orthopnea or PND.  Patient denies any headache, change in vision, upper respiratory symptoms and nausea, vomiting, abdominal pain, diarrhea, dysuria or hematuria. He denies any recent change in his appetite or weight. He do endorse chronic constipation, last bowel movement was yesterday.  In ED he was orthostatic positive vitals, initial blood workup show sodium 132 and potassium 5.3 but sample was hemolyzed. Hemoglobin of 8.1 and worsening renal functions as compare to previous lab. one month ago.  Meds:  Current Facility-Administered Medications for the 11/10/16 encounter [facility] Encounter)  Medication  . betamethasone acetate-betamethasone sodium phosphate (CELESTONE) injection 12 mg  . betamethasone acetate-betamethasone sodium phosphate (CELESTONE) injection 3 mg   Current  Meds  Medication Sig  . amLODipine (NORVASC) 5 MG tablet Take 1 tablet (5 mg total) by mouth daily.  [name] aspirin EC 81 MG tablet Take 1 tablet (81 mg total) by mouth daily.  [name] gabapentin (NEURONTIN) 300 MG capsule Take 1 capsule (300 mg total) by mouth 4 (four) times daily. Take 1 tab po am and lunch, 2 tabs at night.  . insulin aspart protamine - aspart (NOVOLOG 70/30 MIX) (70-30) 100 UNIT/ML FlexPen Inject 0.05 mLs (5 Units total) into the skin 2 (two) times daily with a meal.  . lisinopril-hydrochlorothiazide (PRINZIDE,ZESTORETIC) 10-12.5 MG tablet Take 1 tablet by mouth every morning.  . NONFORMULARY OR COMPOUNDED ITEM [facility]  [place]chilles Tendonitis Cream- Diclofenac 3%, Baclofen 2%, Bupivacaine 1%, Doxepin 5%, Gabapentin 6%, Ibuprofen 3%, Pentoxifylline 3% Apply 1-2 grams to affected area 3-4 times daily Qty. 120 gm 3 refills  . pravastatin (PRAVACHOL) 20 MG tablet Take 1 tablet (20 mg total) by mouth daily.     Allergies: Allergies as of 11/10/2016  . (No Known Allergies)   Past Medical History:  Diagnosis Date  . Arthritis    shoulders  . CKD (chronic kidney disease), stage III   . Elevated PSA   . GERD (gastroesophageal reflux disease)   . History of ketoacidosis    03-29-2015   . History of sepsis    07-25-2013  non-compliant w/ medication  . Hypertension   . Nocturia   . Peripheral neuropathy   . Prostate cancer ([place])   . Type 2 diabetes mellitus with insulin therapy ([place])  Family History: Multiple family members including sister, brother and nephews with diabetes.  Social History: He lives with a friend. Smoke about half pack per day for 50 years. Drinking couple of 40s/day, most of the days. Denies any illicit drug use.  Review of Systems: A complete ROS was negative except as per HPI.   Physical Exam: Blood pressure (!) 115/53, pulse 77, temperature 98.2 F (36.8 C), resp. rate 19, height 5\' 9"  (1.753 m), weight 152 lb 9.6 oz (69.2 kg), SpO2  100 %. Vitals:   11/10/16 1430 11/10/16 1445 11/10/16 1500 11/10/16 1542  BP: 134/83 132/77 113/72 (!) 115/53  Pulse: 80 80 (!) 114 77  Resp: 20 18 (!) 22 19  Temp:    98.2 F (36.8 C)  TempSrc:      SpO2: 100% 100% 99% 100%  Weight:    152 lb 9.6 oz (69.2 kg)  Height:    5\' 9"  (1.753 m)   Orthostatic VS for the past 24 hrs:  BP- Lying Pulse- Lying BP- Sitting Pulse- Sitting BP- Standing at 0 minutes Pulse- Standing at 0 minutes  11/10/16 1228 110/74 83 105/72 86 (!) 87/69 85   General: Vital signs reviewed.  Patient is well-developed and well-nourished, in no acute distress and cooperative with exam.  Head: Normocephalic and atraumatic. Eyes: EOMI, conjunctival pallor, no scleral icterus.  Neck: Supple, trachea midline, normal ROM, no JVD, masses, thyromegaly, or carotid bruit present.  Cardiovascular: RRR, S1 normal, S2 normal, no murmurs, gallops, or rubs. Pulmonary/Chest: Clear to auscultation bilaterally, no wheezes, rales, or rhonchi. Abdominal: Soft, non-tender, non-distended, BS +, no masses, organomegaly, or guarding present.  Musculoskeletal: Locking of bilateral ring fingers with finger extension, No erythema, or stiffness, ROM full and nontender. Extremities: No lower extremity edema bilaterally,  pulses symmetric and intact bilaterally. No cyanosis or clubbing. Neurological: A&O x3, Strength is normal and symmetric bilaterally, cranial nerve II-XII are grossly intact, no focal motor deficit, sensory intact to light touch bilaterally.  Skin: Warm, dry and intact. No rashes or erythema. Psychiatric: Normal mood and affect. speech and behavior is normal. Cognition and memory are normal.  Labs. CBC Latest Ref Rng & Units 11/10/2016 10/02/2016 12/21/2015  WBC 4.0 - 10.5 K/uL 4.7 5.1 -  Hemoglobin 13.0 - 17.0 g/dL 8.1(L) - 15.0  Hematocrit 39.0 - 52.0 % 23.7(L) 29.6(L) 44.0  Platelets 150 - 400 K/uL 201 227 -   CMP Latest Ref Rng & Units 11/10/2016 10/02/2016 06/21/2016  Glucose  65 - 99 mg/dL 153(H) 96 127(H)  BUN 6 - 20 mg/dL 50(H) 36(H) 31(H)  Creatinine 0.61 - 1.24 mg/dL 2.56(H) 1.48(H) 1.68(H)  Sodium 135 - 145 mmol/L 132(L) 140 139  Potassium 3.5 - 5.1 mmol/L 5.3(H) 4.6 4.9  Chloride 101 - 111 mmol/L 105 102 107  CO2 22 - 32 mmol/L 17(L) 19 17(L)  Calcium 8.9 - 10.3 mg/dL 9.1 10.0 9.4  Total Protein 6.5 - 8.1 g/dL 6.3(L) - -  Total Bilirubin 0.3 - 1.2 mg/dL 0.5 - -  Alkaline Phos 38 - 126 U/L 82 - -  AST 15 - 41 U/L 18 - -  ALT 17 - 63 U/L 14(L) - -   Urinalysis    Component Value Date/Time   COLORURINE STRAW (A) 11/10/2016 1528   APPEARANCEUR CLEAR 11/10/2016 1528   LABSPEC 1.006 11/10/2016 1528   PHURINE 5.0 11/10/2016 1528   GLUCOSEU NEGATIVE 11/10/2016 1528   [place] 11/10/2016 1528   [place] 11/10/2016 1528   [place]  11/10/2016 1528   PROTEINUR NEGATIVE 11/10/2016 1528   UROBILINOGEN 1.0 03/29/2015 1941   NITRITE NEGATIVE 11/10/2016 1528   LEUKOCYTESUR NEGATIVE 11/10/2016 1528   Iron/TIBC/Ferritin/ %Sat    Component Value Date/Time   IRON 43 (L) 11/10/2016 1450   IRON 101 10/09/2016 0943   TIBC 272 11/10/2016 1450   TIBC 294 10/09/2016 0943   FERRITIN 476 (H) 11/10/2016 1450   FERRITIN 679 (H) 10/09/2016 0929   IRONPCTSAT 16 (L) 11/10/2016 1450   IRONPCTSAT 34 10/09/2016 0943   IRONPCTSAT 30 11/01/2015 0948   Reticulocytes  Order: [id number]  Status:  Final result Visible to patient:  No (Not Released) Next appt:  11/14/2016 at 09:00 AM in Gastroenterology ([facility] Previsit)   Ref Range & Units 14:50  Retic Ct Pct 0.4 - 3.1 % 2.1   RBC. 4.22 - 5.81 MIL/uL 2.36    Retic Count, Manual 19.0 - 186.0 K/uL 49.6        Folate. 16.4 B12. 199  EKG: Sinus rhythm no acute change, no peak T waves.   Assessment & Plan by Problem:  Robert Sparks is a 71 y.o. man with PMHx significant for CKD stage III, type 2 diabetes with neuropathy, hypertension and prostate cancer T2b, sp radiation trx  completed in December 2017 Came to ED with complaint of lightheadedness and generalized weakness going on for a few months.  Orthostasis/ positional lightheadedness. Looks like a chronic symptoms going on for few month. He denies any acute worsening. His orthostasis vitals were positive for a drop in his blood pressure but there was no concurrent increase in heart rate. Because of his chronic diabetes can be an autonomic dysfunction. Chronic Dehydration can be another possibility, he appears mildly dry. Most likely because of decreased by mouth fluid intake as he told us that he drinks 2-3 small cups of water and he takes hydrochlorothiazide regularly. He also admitted couple of 15s most of the days. -IV normal saline 500 mL bolus. -NS 121ml/hr overnight. -Repeat orthostatic vitals in the morning. -Encouraged him to increase his fluid intake.  AKI On CKD. Patient had chronic kidney disease stage III up to one month ago. During initial labs. In ED his creatinine went up to 2.56 from 1.48 which cause a decrease in his GFR from 55>>28. -Repeat BMP in the morning after fluid resuscitation.  Iron deficiency anemia. His hemoglobin dropped from 15>>8 in 10 months, with 2 point drop in 1 month. On his previous iron studies he was having normal iron stores with increased ferritin which was more consistent with anemia of chronic illness, most likely because of his CKD. On repeat testing during this admission shows decreased in iron along with saturation ratios. He denies any obvious bleeding, his UA was negative for any hematuria, FOBT was negative. Although his diet does not consist of much fruits and vegetable but he does eat meat, chicken and occasionally vegetables. He has no history of colonoscopy which was offered in the past and denied because of copayment cost. Although he denied any recent change in his appetite and weight, there is a loss of 7 pound according to his previous office visit in  10/02/2016. -He should have a colonoscopy to rule out any GI malignancy. -He will benefit with iron replacement therapy.  Hypertension. Currently normotensive with few softer blood pressure readings in ED. -We will hold his amlodipine and lisinopril plus hydrochlorothiazide combination.  Diabetes.Well controlled.A1c on 10/02/16. He was on 70/30   5 units twice a day  at home. -CBG monitoring with SSI.  Alcohol abuse. He do have an history of drinking couple of 40s most of the days. -CIWA protocol.  History of prostate cancer. He completed his radiation in December 2017. He has no current symptoms. He follow-up with radiation oncology.   CODE STATUS. Full DVT prophylaxis. Lovenox  Diet.  carb modified.  Dispo: Admit patient to Observation with expected length of stay less than 2 midnights.  Signed: Lorella Nimrod, MD 11/10/2016, 4:56 PM  Pager: [phone]

## 2016-11-10 NOTE — Progress Notes (Signed)
Attempted to take report. Nurse will call back. Modena Morrow E, RN 11/10/2016 3:12 PM

## 2016-11-10 NOTE — ED Provider Notes (Signed)
[facility] DEPT Provider Note   CSN: [id number] Arrival date & time: 11/10/16  1133     History   Chief Complaint Chief Complaint  Patient presents with  . Dizziness    HPI Mico Spark Mahmood is a 71 y.o. male.   Dizziness  Quality:  Lightheadedness Severity:  Moderate Onset quality:  Gradual Duration:  4 weeks Timing:  Constant Chronicity:  New Context: not when bending over   Relieved by:  None tried Exacerbated by: nothing consistently, but when it happens it is when standing up. Associated symptoms: weakness   Associated symptoms: no diarrhea       Past Medical History:  Diagnosis Date  . Arthritis    shoulders  . CKD (chronic kidney disease), stage III   . Elevated PSA   . GERD (gastroesophageal reflux disease)   . History of ketoacidosis    03-29-2015   . History of sepsis    07-25-2013  non-compliant w/ medication  . Hypertension   . Nocturia   . Peripheral neuropathy   . Prostate cancer ([place])   . Type 2 diabetes mellitus with insulin therapy [facility])     Patient Active Problem List   Diagnosis Date Noted  . Lightheadedness 11/10/2016  . Tobacco abuse 06/21/2016  . Hyperlipidemia 06/21/2016  . CKD stage 3 due to type 2 diabetes mellitus ([place]) 02/23/2016  . Malignant neoplasm of prostate ([place]) 02/21/2016  . Diabetic ketoacidosis without coma associated with other specified diabetes mellitus ([place])   . Controlled type 2 diabetes mellitus with stage 3 chronic kidney disease, with long-term current use of insulin ([place]) 03/29/2015  . HTN (hypertension) 07/28/2013  . Renal failure (ARF), acute on chronic (HCC) 07/27/2013  . Anemia, chronic disease 07/26/2013  . Pancytopenia ([place]) 07/26/2013  . Hypoglycemia 07/25/2013  . Acute encephalopathy 07/25/2013  . Sepsis ([place]) 07/25/2013  . Hypothermia 07/25/2013  . ETOH abuse 07/25/2013  . Metabolic acidosis 83/72/9021  . ARF (acute renal failure) ([place]) 07/25/2013    Past Surgical History:  Procedure  Laterality Date  . CIRCUMCISION  1990's  . PROSTATE BIOPSY N/A 12/21/2015   Procedure: BIOPSY TRANSRECTAL ULTRASONIC PROSTATE (TUBP);  Surgeon: Festus Aloe, MD;  Location: [facility];  Service: Urology;  Laterality: N/A;       Home Medications    Prior to Admission medications   Medication Sig Start Date End Date Taking? Authorizing Provider  amLODipine (NORVASC) 5 MG tablet Take 1 tablet (5 mg total) by mouth daily. 10/02/16   Maren Reamer, MD  aspirin EC 81 MG tablet Take 1 tablet (81 mg total) by mouth daily. 10/02/16   Maren Reamer, MD  Blood Glucose Monitoring Suppl (ACCU-CHEK AVIVA PLUS) w/Device KIT 1 application by Does not apply route 3 (three) times daily. 08/07/16   Maren Reamer, MD  gabapentin (NEURONTIN) 300 MG capsule Take 1 capsule (300 mg total) by mouth 4 (four) times daily. Take 1 tab po am and lunch, 2 tabs at night. 10/02/16   Maren Reamer, MD  glucose blood test strip Use as instructed 08/07/16   Maren Reamer, MD  insulin aspart protamine - aspart (NOVOLOG 70/30 MIX) (70-30) 100 UNIT/ML FlexPen Inject 0.05 mLs (5 Units total) into the skin 2 (two) times daily with a meal. 10/02/16   Maren Reamer, MD  Insulin Pen Needle (PEN NEEDLES 3/16") 31G X 5 MM MISC Use one needle twice a day as directed. 10/28/15   Veryl Speak, MD  Lancet Devices (  ACCU-CHEK SOFTCLIX) lancets Use as instructed 10/02/16   Maren Reamer, MD  lisinopril-hydrochlorothiazide (PRINZIDE,ZESTORETIC) 10-12.5 MG tablet Take 1 tablet by mouth every morning. 10/02/16   Maren Reamer, MD  NONFORMULARY OR COMPOUNDED ITEM [facility]  [place]illes Tendonitis Cream- Diclofenac 3%, Baclofen 2%, Bupivacaine 1%, Doxepin 5%, Gabapentin 6%, Ibuprofen 3%, Pentoxifylline 3% Apply 1-2 grams to affected area 3-4 times daily Qty. 120 gm 3 refills 10/30/16   Edrick Kins, DPM  polyethylene glycol powder (GLYCOLAX/MIRALAX) powder TAKE AS DIRECTED FOR COLONOSCOPY PREP. 08/02/16    Amy S Esterwood, PA-C  pravastatin (PRAVACHOL) 20 MG tablet Take 1 tablet (20 mg total) by mouth daily. 10/02/16   Maren Reamer, MD  TRUEPLUS LANCETS 26G MISC 1 each by Does not apply route every 8 (eight) hours as needed. 11/01/15   Maren Reamer, MD  ULTICARE MICRO PEN NEEDLES 32G X 4 MM MISC Inject 1 applicator into the skin 2 (two) times daily. 10/02/16   Maren Reamer, MD    Family History Family History  Problem Relation Age of Onset  . Cancer Neg Hx   . Colon cancer Neg Hx     Social History Social History  Substance Use Topics  . Smoking status: Current Every Day Smoker    Packs/day: 0.25    Years: 50.00    Types: Cigarettes  . Smokeless tobacco: Never Used     Comment: 1 ppwk  . Alcohol use No     Comment: "I been quit 3 months"     Allergies   Patient has no known allergies.   Review of Systems Review of Systems  Gastrointestinal: Negative for diarrhea.  Neurological: Positive for dizziness and weakness.  All other systems reviewed and are negative.    Physical Exam Updated Vital Signs BP 131/77   Pulse 79   Temp 98.4 F (36.9 C) (Oral)   Resp 20   SpO2 100%   Physical Exam  Constitutional: He is oriented to person, place, and time. He appears well-developed and well-nourished.  HENT:  Head: Normocephalic and atraumatic.  Eyes: Conjunctivae and EOM are normal.  Neck: Normal range of motion.  Cardiovascular: Normal rate.   Orthostatic positive  Pulmonary/Chest: Effort normal. No respiratory distress.  Abdominal: Soft. He exhibits no distension.  Musculoskeletal: Normal range of motion.  Neurological: He is alert and oriented to person, place, and time. No cranial nerve deficit. Coordination normal.  Skin: Skin is warm and dry.  Nursing note and vitals reviewed.    ED Treatments / Results  Labs (all labs ordered are listed, but only abnormal results are displayed) Labs Reviewed  BASIC METABOLIC PANEL - Abnormal; Notable for the  following:       Result Value   Sodium 132 (*)    Potassium 5.3 (*)    CO2 17 (*)    Glucose, Bld 153 (*)    BUN 50 (*)    Creatinine, Ser 2.56 (*)    GFR calc non Af Amer 24 (*)    GFR calc Af Amer 28 (*)    All other components within normal limits  CBC - Abnormal; Notable for the following:    RBC 2.39 (*)    Hemoglobin 8.1 (*)    HCT 23.7 (*)    All other components within normal limits  VITAMIN B12  FOLATE  IRON AND TIBC  FERRITIN  RETICULOCYTES  POC OCCULT BLOOD, ED  TYPE AND SCREEN    EKG  EKG Interpretation  Date/Time:  Friday Nov 10 2016 11:38:16 EDT Ventricular Rate:  84 PR Interval:  150 QRS Duration: 66 QT Interval:  358 QTC Calculation: 423 R Axis:   69 Text Interpretation:  Normal sinus rhythm No significant change since last tracing Confirmed by [facility] MD, Corene Cornea [phone]) on 11/10/2016 11:57:05 AM       Radiology No results found.  Procedures Procedures (including critical care time)  Medications Ordered in ED Medications  sodium chloride 0.9 % bolus 1,000 mL (1,000 mLs Intravenous New Bag/Given 11/10/16 1357)     Initial Impression / Assessment and Plan / ED Course  I have reviewed the triage vital signs and the nursing notes.  Pertinent labs & imaging results that were available during my care of the patient were reviewed by me and considered in my medical decision making (see chart for details).     71 year old male with an acute anemia of uncertain etiology. His dropped, 7 g of hemoglobin since June 2017. He is dropped a few grams since last month. His Hemoccult was negative. Has positive orthostatic vital signs. His kidney function is worse than normal as well. No acute indication for transfusion right now as he is not consistently symptomatic, but needs further workup for the anemia, weakness, lightheadedness and an sure his kidney function is not worsening.  Final Clinical Impressions(s) / ED Diagnoses   Final diagnoses:  Anemia,  unspecified type  Acute kidney injury superimposed on chronic kidney disease (HCC)      Merrily Pew, MD 11/10/16 1424

## 2016-11-11 DIAGNOSIS — N179 Acute kidney failure, unspecified: Secondary | ICD-10-CM

## 2016-11-11 DIAGNOSIS — Z8546 Personal history of malignant neoplasm of prostate: Secondary | ICD-10-CM | POA: Diagnosis not present

## 2016-11-11 DIAGNOSIS — I951 Orthostatic hypotension: Secondary | ICD-10-CM | POA: Diagnosis not present

## 2016-11-11 DIAGNOSIS — D509 Iron deficiency anemia, unspecified: Secondary | ICD-10-CM

## 2016-11-11 DIAGNOSIS — F1010 Alcohol abuse, uncomplicated: Secondary | ICD-10-CM | POA: Diagnosis not present

## 2016-11-11 DIAGNOSIS — N183 Chronic kidney disease, stage 3 (moderate): Secondary | ICD-10-CM

## 2016-11-11 DIAGNOSIS — E1122 Type 2 diabetes mellitus with diabetic chronic kidney disease: Secondary | ICD-10-CM

## 2016-11-11 DIAGNOSIS — Z79899 Other long term (current) drug therapy: Secondary | ICD-10-CM

## 2016-11-11 DIAGNOSIS — I129 Hypertensive chronic kidney disease with stage 1 through stage 4 chronic kidney disease, or unspecified chronic kidney disease: Secondary | ICD-10-CM | POA: Diagnosis not present

## 2016-11-11 DIAGNOSIS — Z923 Personal history of irradiation: Secondary | ICD-10-CM | POA: Diagnosis not present

## 2016-11-11 LAB — HIV ANTIBODY (ROUTINE TESTING W REFLEX): HIV SCREEN 4TH GENERATION: NONREACTIVE

## 2016-11-11 LAB — CBC
HEMATOCRIT: 25.7 % — AB (ref 39.0–52.0)
Hemoglobin: 8.6 g/dL — ABNORMAL LOW (ref 13.0–17.0)
MCH: 33 pg (ref 26.0–34.0)
MCHC: 33.5 g/dL (ref 30.0–36.0)
MCV: 98.5 fL (ref 78.0–100.0)
Platelets: 200 10*3/uL (ref 150–400)
RBC: 2.61 MIL/uL — ABNORMAL LOW (ref 4.22–5.81)
RDW: 14.2 % (ref 11.5–15.5)
WBC: 5.4 10*3/uL (ref 4.0–10.5)

## 2016-11-11 LAB — BASIC METABOLIC PANEL
ANION GAP: 8 (ref 5–15)
BUN: 33 mg/dL — AB (ref 6–20)
CO2: 20 mmol/L — AB (ref 22–32)
Calcium: 9 mg/dL (ref 8.9–10.3)
Chloride: 107 mmol/L (ref 101–111)
Creatinine, Ser: 1.67 mg/dL — ABNORMAL HIGH (ref 0.61–1.24)
GFR calc Af Amer: 47 mL/min — ABNORMAL LOW (ref >60)
GFR calc non Af Amer: 40 mL/min — ABNORMAL LOW (ref >60)
GLUCOSE: 182 mg/dL — AB (ref 65–99)
Potassium: 4.8 mmol/L (ref 3.5–5.1)
Sodium: 135 mmol/L (ref 135–145)

## 2016-11-11 LAB — GLUCOSE, CAPILLARY
GLUCOSE-CAPILLARY: 166 mg/dL — AB (ref 65–99)
Glucose-Capillary: 108 mg/dL — ABNORMAL HIGH (ref 65–99)

## 2016-11-11 MED ORDER — FERROUS SULFATE 325 (65 FE) MG PO TABS
325.0000 mg | ORAL_TABLET | Freq: Every day | ORAL | Status: DC
  Administered 2016-11-11: 325 mg via ORAL
  Filled 2016-11-11: qty 1

## 2016-11-11 MED ORDER — ENOXAPARIN SODIUM 40 MG/0.4ML ~~LOC~~ SOLN: 40.0000 mg | SUBCUTANEOUS | Status: DC

## 2016-11-11 MED ORDER — ADULT MULTIVITAMIN W/MINERALS CH: 1.0000 | ORAL_TABLET | Freq: Every day | ORAL | 2 refills | Status: AC

## 2016-11-11 MED ORDER — THIAMINE HCL 100 MG PO TABS: 100.0000 mg | ORAL_TABLET | Freq: Every day | ORAL | 0 refills | Status: DC

## 2016-11-11 MED ORDER — SENNOSIDES-DOCUSATE SODIUM 8.6-50 MG PO TABS: 2.0000 | ORAL_TABLET | Freq: Two times a day (BID) | ORAL | 2 refills | Status: DC

## 2016-11-11 MED ORDER — FERROUS SULFATE 325 (65 FE) MG PO TABS: 325.0000 mg | ORAL_TABLET | Freq: Every day | ORAL | 3 refills | Status: DC

## 2016-11-11 MED ORDER — GABAPENTIN 300 MG PO CAPS
300.0000 mg | ORAL_CAPSULE | Freq: Four times a day (QID) | ORAL | Status: DC
  Administered 2016-11-11 (×2): 300 mg via ORAL
  Filled 2016-11-11 (×2): qty 1

## 2016-11-11 MED ORDER — FOLIC ACID 1 MG PO TABS: 1.0000 mg | ORAL_TABLET | Freq: Every day | ORAL | 0 refills | Status: DC

## 2016-11-11 NOTE — Discharge Summary (Signed)
Name: Robert Sparks MRN: [id number] DOB: March 15, 1946 71 y.o. PCP: Maren Reamer, MD  Date of Admission: 11/10/2016 11:52 AM Date of Discharge: 11/11/2016 Attending Physician: Bartholomew Crews, MD  Discharge Diagnosis: 1. Orthostasis. 2. Iron deficiency anemia. 3. Hypertension. 4. Alcohol abuse. 5. Well-controlled diabetes.  Discharge Medications: Allergies as of 11/11/2016   No Known Allergies     Medication List    STOP taking these medications   lisinopril-hydrochlorothiazide 10-12.5 MG tablet Commonly known as:  PRINZIDE,ZESTORETIC     TAKE these medications   amLODipine 5 MG tablet Commonly known as:  NORVASC Take 1 tablet (5 mg total) by mouth daily.   aspirin EC 81 MG tablet Take 1 tablet (81 mg total) by mouth daily.   ferrous sulfate 325 (65 FE) MG tablet Take 1 tablet (325 mg total) by mouth daily with breakfast.   folic acid 1 MG tablet Commonly known as:  FOLVITE Take 1 tablet (1 mg total) by mouth daily.   gabapentin 300 MG capsule Commonly known as:  NEURONTIN Take 1 capsule (300 mg total) by mouth 4 (four) times daily. Take 1 tab po am and lunch, 2 tabs at night.   insulin aspart protamine - aspart (70-30) 100 UNIT/ML FlexPen Commonly known as:  NOVOLOG 70/30 MIX Inject 0.05 mLs (5 Units total) into the skin 2 (two) times daily with a meal.   multivitamin with minerals Tabs tablet Take 1 tablet by mouth daily.   NONFORMULARY OR COMPOUNDED ITEM [facility]  Achilles Tendonitis Cream- Diclofenac 3%, Baclofen 2%, Bupivacaine 1%, Doxepin 5%, Gabapentin 6%, Ibuprofen 3%, Pentoxifylline 3% Apply 1-2 grams to affected area 3-4 times daily Qty. 120 gm 3 refills   pravastatin 20 MG tablet Commonly known as:  PRAVACHOL Take 1 tablet (20 mg total) by mouth daily.   senna-docusate 8.6-50 MG tablet Commonly known as:  Senokot-S Take 2 tablets by mouth 2 (two) times daily.   thiamine 100 MG tablet Take 1 tablet (100 mg total) by mouth  daily.       Disposition and follow-up:   Mr.Robert Sparks was discharged from [facility] in Good condition.  At the hospital follow up visit please address:  1.  His orthostatic vitals. -Keep encouraging him to increase fluid intake and decrease his alcohol intake. -His blood pressure as we decreased his home antihypertensives because of positive orthostasis. -Please encourage him to have his colonoscopy done as scheduled.  2.  Labs / imaging needed at time of follow-up: BMP  3.  Pending labs/ test needing follow-up: MMA  Follow-up Appointments:   Hospital Course by problem list:  Robert Sparks a 71 y.o.man with PMHx significant for CKD stage III,type 2 diabetes with neuropathy,hypertension and prostate cancer T2b, sp radiation trx completed in December 2017 Came to ED with complaint of lightheadedness and generalized weakness going on for a few months.  Orthostasis. On presentation his lightheadedness was postural mostly whenever he stands up and walk after lying down. His orthostatic vitals were positive for drop in blood pressure only. His heart rate never went up. His orthostatic found to be multifactorial including decreased intravascular volume because of his poor fluid intake and increased alcohol intake along with some autonomic dysfunction because of his diabetes. His symptoms improved with fluid resuscitation.he was on 3 blood pressure medications.we had his amlodipine and hydrochlorothiazide on discharge. He was encouraged to increase by mouth water intake and decrease his alcohol intake. His PCP can monitor his orthostasis  and titrate his antihypertensive accordingly.  Hypertension.He was normotensive to softer blood pressure during this stay. On discharge we just started his home dose of lisinopril and keep holding his amlodipine and hydrochlorothiazide. His PCP can adjust his antihypertensives as an outpatient.  Iron deficiency anemia. His  repeat iron studies shows iron deficiency, his previous iron studies done 1 month ago was more consistent with anemia of chronic illness with normal iron stores. He denied any obvious source of bleeding. His anemia is  most likely multifactorial  because of his chronic kidney disease, excessive alcohol intake, iron deficiency can be due to nutritional deficiency or any chronic blood loss. Although his FOBT was negative but that does not rule out any GI malignancy. He has his colonoscopy scheduled for 11/28/2016. His PCP should be able to follow-up. We  discharged him on iron supplement.  AKI On CKD.He has CK D stage III on presentation his creatinine was elevated which which becomes at his baseline after fluid resuscitation. According to patient his PCP is arranging for a nephrology referral.  Well-controlled diabetes.A1c on 10/02/16 was 6.1. He denied any hypoglycemic event. We recommend that if his PCP can loosen up some of his A1c goal. We discharged him at his home dose of 70/30.  Alcohol abuse. He does not exhibit any sign of withdrawal. We encouraged him to stop drinking. His drinking habits needs to be followed up.  History of prostate cancer.He completed his radiation in December 2017.He has no current symptoms. He follow-up with radiation oncology.  Discharge Vitals:   BP 113/70 (BP Location: Left Arm)   Pulse 80   Temp 98.2 F (36.8 C) (Oral)   Resp 17   Ht 5\' 9"  (1.753 m)   Wt 152 lb 9.6 oz (69.2 kg)   SpO2 97%   BMI 22.54 kg/m    Gen. Well-developed man, sitting comfortably in his bed, in no acute distress. Lungs. Clear bilaterally. CV. Regular rate and rhythm. Abdomen.Soft, non tender, BS +ve  Extremities.No edema, no cyanosis,pulses 2+ bilaterally.  Pertinent Labs, Studies, and Procedures:  CBC Latest Ref Rng & Units 11/11/2016 11/10/2016 10/02/2016  WBC 4.0 - 10.5 K/uL 5.4 4.7 5.1  Hemoglobin 13.0 - 17.0 g/dL 8.6(L) 8.1(L) -  Hematocrit 39.0 - 52.0 % 25.7(L)  23.7(L) 29.6(L)  Platelets 150 - 400 K/uL 200 201 227   CMP Latest Ref Rng & Units 11/11/2016 11/10/2016 11/10/2016  Glucose 65 - 99 mg/dL 182(H) 187(H) -  BUN 6 - 20 mg/dL 33(H) 47(H) -  Creatinine 0.61 - 1.24 mg/dL 1.67(H) 2.10(H) -  Sodium 135 - 145 mmol/L 135 134(L) -  Potassium 3.5 - 5.1 mmol/L 4.8 4.8 -  Chloride 101 - 111 mmol/L 107 107 -  CO2 22 - 32 mmol/L 20(L) 17(L) -  Calcium 8.9 - 10.3 mg/dL 9.0 8.5(L) -  Total Protein 6.5 - 8.1 g/dL - - 6.3(L)  Total Bilirubin 0.3 - 1.2 mg/dL - - 0.5  Alkaline Phos 38 - 126 U/L - - 82  AST 15 - 41 U/L - - 18  ALT 17 - 63 U/L - - 14(L)   Iron/TIBC/Ferritin/ %Sat    Component Value Date/Time   IRON 43 (L) 11/10/2016 1450   IRON 101 10/09/2016 0943   TIBC 272 11/10/2016 1450   TIBC 294 10/09/2016 0943   FERRITIN 476 (H) 11/10/2016 1450   FERRITIN 679 (H) 10/09/2016 0929   IRONPCTSAT 16 (L) 11/10/2016 1450   IRONPCTSAT 34 10/09/2016 0943   IRONPCTSAT 30 11/01/2015 0948  Reticulocytes  Order: [id number]  Status:  Final result Visible to patient:  No (Not Released) Next appt:  11/14/2016 at 09:00 AM in Gastroenterology ([facility] Previsit)   Ref Range & Units 14:50  Retic Ct Pct 0.4 - 3.1 % 2.1   RBC. 4.22 - 5.81 MIL/uL 2.36    Retic Count, Manual 19.0 - 186.0 K/uL 49.6         Urinalysis    Component Value Date/Time   COLORURINE STRAW (A) 11/10/2016 1528   APPEARANCEUR CLEAR 11/10/2016 1528   LABSPEC 1.006 11/10/2016 1528   PHURINE 5.0 11/10/2016 1528   GLUCOSEU NEGATIVE 11/10/2016 1528   [place] 11/10/2016 1528   [place] 11/10/2016 1528   [place] 11/10/2016 1528   PROTEINUR NEGATIVE 11/10/2016 1528   UROBILINOGEN 1.0 03/29/2015 1941   NITRITE NEGATIVE 11/10/2016 1528   LEUKOCYTESUR NEGATIVE 11/10/2016 1528   POC occult blood, ED  Order: [id number]  Status:  Final result Visible to patient:  No (Not Released) Next appt:  11/14/2016 at 09:00 AM in Gastroenterology ([facility]  Previsit)   Ref Range & Units 3d ago  Fecal Occult Bld NEGATIVE NEGATIVE        HIV antibody  Order: [id number]  Status:  Final result Visible to patient:  No (Not Released) Next appt:  11/14/2016 at 09:00 AM in Gastroenterology ([facility] Previsit)    Ref Range & Units 2d ago 14yr ago   HIV Screen 4th Generation wRfx Non Reactive Non Reactive  NON REACTIVER, CM   Comments: (NOTE)         Folate. 16.4 B12. 199 TSH. 1.569  EKG: Sinus rhythm no acute change. Discharge Instructions: Discharge Instructions    Diet - low sodium heart healthy    Complete by:  As directed    Discharge instructions    Complete by:  As directed    It was pleasure taking care of you. I am holding one of your blood pressure medicine and the combination pill called Prinzide or Zestoretic, your primary care can restart it if needed for blood pressure. Please drink plenty of water to avoid dizziness. Please decrease your alcohol intake-it will be really advisable if you stop drinking. Please make an appointment with your PCP to be seen within a week.   Increase activity slowly    Complete by:  As directed       Signed: Lorella Nimrod, MD 11/13/2016, 3:41 PM   Pager: [phone]

## 2016-11-11 NOTE — Progress Notes (Signed)
Subjective:  [name] is doing fairly well today and reports no increased issues with his light headedness.  He states that he is still getting lightheaded when he stands up but that the nurse told him "shake his legs" before standing which seems to help.  He complains of some mild anxiety, and sleep issues; making reference to our discussion yesterday about potential withdrawal symptoms that can occur when going without EtOH while in the hospital.  Otherwise he does not report any pain, nausea, shortness of breath, dysuria/hematuria, constipation, or abdominal pain.     Objective: Vital signs in last 24 hours: Vitals:   11/10/16 1542 11/10/16 2136 11/11/16 0531 11/11/16 0729  BP: (!) 115/53 109/66 133/71 132/75  Pulse: 77 73 72 85  Resp: 19 18 18    Temp: 98.2 F (36.8 C) 98.3 F (36.8 C) 97.8 F (36.6 C)   TempSrc:      SpO2: 100% 98% 100% 99%  Weight: 152 lb 9.6 oz (69.2 kg)     Height: 5\' 9"  (1.753 m)       Intake/Output Summary (Last 24 hours) at 11/11/16 1208 Last data filed at 11/11/16 1155  Gross per 24 hour  Intake            507.5 ml  Output             2400 ml  Net          -1892.5 ml   General appearance: alert and no distress Lungs: clear to auscultation bilaterally Heart: regular rate and rhythm, S1, S2 normal, no murmur, click, rub or gallop Abdomen: soft, non-tender; bowel sounds normal; no masses,  no organomegaly Extremities: extremities normal, atraumatic, no cyanosis or edema Lab Results: CMP Latest Ref Rng & Units 11/10/2016 11/10/2016 11/10/2016  Glucose 65 - 99 mg/dL 187(H) - 153(H)  BUN 6 - 20 mg/dL 47(H) - 50(H)  Creatinine 0.61 - 1.24 mg/dL 2.10(H) - 2.56(H)  Sodium 135 - 145 mmol/L 134(L) - 132(L)  Potassium 3.5 - 5.1 mmol/L 4.8 - 5.3(H)  Chloride 101 - 111 mmol/L 107 - 105  CO2 22 - 32 mmol/L 17(L) - 17(L)  Calcium 8.9 - 10.3 mg/dL 8.5(L) - 9.1  Total Protein 6.5 - 8.1 g/dL - 6.3(L) -  Total Bilirubin 0.3 - 1.2 mg/dL - 0.5 -  Alkaline Phos 38 -  126 U/L - 82 -  AST 15 - 41 U/L - 18 -  ALT 17 - 63 U/L - 14(L) -   CBC Latest Ref Rng & Units 11/11/2016 11/10/2016 10/02/2016  WBC 4.0 - 10.5 K/uL 5.4 4.7 5.1  Hemoglobin 13.0 - 17.0 g/dL 8.6(L) 8.1(L) -  Hematocrit 39.0 - 52.0 % 25.7(L) 23.7(L) 29.6(L)  Platelets 150 - 400 K/uL 200 201 227     Medications: I have reviewed the patient's current medications. Scheduled Meds: . aspirin EC  325 mg Oral Daily  . enoxaparin (LOVENOX) injection  30 mg Subcutaneous Q24H  . ferrous sulfate  325 mg Oral Q breakfast  . folic acid  1 mg Oral Daily  . gabapentin  300 mg Oral QID  . insulin aspart  0-9 Units Subcutaneous TID WC  . insulin aspart protamine- aspart  5 Units Subcutaneous BID WC  . multivitamin with minerals  1 tablet Oral Daily  . pravastatin  20 mg Oral Daily  . senna-docusate  2 tablet Oral BID  . thiamine  100 mg Oral Daily   Or  . thiamine  100 mg Intravenous Daily  Continuous Infusions: PRN Meds:.acetaminophen, LORazepam **OR** LORazepam Assessment/Plan: Active Problems:   Lightheadedness  [name] is a 71 yo male with a PMHx significant for CKD (stage III), DM, HTN, hyperlipidemia, prostate cancer, and EtOH abuse who presented to the ED complaining of lightheadedness.   Anemia: The patients Hg dropped to 8.1 down from 10 back in March, and 15 back in June of 2017.  The patients has multiple problems that could explain his anemia including low EPO due to CKD, anemia of chronic disease due to CKD or prostate cancer, malnutrition due to alcohol dependence,  or a myelodysplasia 2/2 his radiation treatment from his prostate cancer. He also has a borderline low B12 which could be causing anemia.  While he has not reported any stool in his blood, and the FOBT was negative, we should still consider GI bleeding as a cause of anemia.   It is very possible that his anemia is multifactorial. Plan:Obtain an MMA to rule out B12 deficiency related anemia and give Fe for iron deficiency.   D/C with a plan to follow up with his PCP for a colonoscopy and a neurology referal   Lightheadedness/orthostatic hypotension: The patient has multiple current problems that could explain his lightheadedness, including his anemia, EtOH dependence, hypoglycemia causing  autonomic dysfunction, or chronic dehydration.   Since there was no concurrent increase in HR when his BP dropped when standing (both on admission and today), there is a greater concern for autonomic dysfunction. His A1C  has been very well controlled, possibly opening him up to hypoglycemic events.  He also appeared dry and only reported drinking a couple of mugs of water and takes a thiazide regularly, so dehydration is a possibility.   Plan: Encourage patient to drink lots of water, counsel on drinking cessation, and have him follow up with his PCP about DM control.  We will discharge him on a lower dose of novelog since his BG seems well controlled.    CKD: The patient had CKD stage 3 prior to admission but GFR of 28 places the patient at stage 4 now.  He currently is not seeing a nephrologist for his condition.  Plan: repeat BMP in the morning after fluid resuscitation, [place] nephrology for follow up.    EtOH abuse:  The patient reports drinking multiple 40s a couple of times a day, making [place] concerned for possible DT in the hosptial.  He has had some possible symptoms in the hospital but we given prophylactic Ativan which most likely blunted any withdrawal symptoms he has.    Plan: Advise to see AA or rehab for alcohol use as it is a contributing factor to many of his conditions  History of prostate cancer: He completed his radiation in December 2017.He has no current symptoms. Plan: Make sure he is following up with his radiation oncology  Diabetes: His A1C was 6.1 back in march which is similar to his A1C back in December at 6.5  It seems he is able to control his DM well on his own.   Plan: Place the patient on a  lower dose of novolog on discharge as it is very well controlled.  Follow up with PCP  This is a Careers information officer Note.  The care of the patient was discussed with Dr. Lynnae January and the assessment and plan formulated with their assistance.  Please see their attached note for official documentation of the daily encounter.   LOS: 0 days   Brendolyn Patty, Medical Student 11/11/2016,  12:08 PM

## 2016-11-11 NOTE — Progress Notes (Signed)
Subjective:  Patient was feeling better this morning when seen. He denied any lightheadedness with ambulation today. We explained him the different causes of his low hemoglobin  and orthostatic vitals, and also ask him to decrease his alcohol intake and increase water intake. He seems understanding and agreeable to that.  Objective:  Vital signs in last 24 hours: Vitals:   11/10/16 1542 11/10/16 2136 11/11/16 0531 11/11/16 0729  BP: (!) 115/53 109/66 133/71 132/75  Pulse: 77 73 72 85  Resp: 19 18 18    Temp: 98.2 F (36.8 C) 98.3 F (36.8 C) 97.8 F (36.6 C)   TempSrc:      SpO2: 100% 98% 100% 99%  Weight: 152 lb 9.6 oz (69.2 kg)     Height: 5\' 9"  (1.753 m)      Gen. Well-developed man, sitting comfortably in his bed, in no acute distress. Lungs. Clear bilaterally. CV. Regular rate and rhythm. Abdomen.Soft, non tender, BS +ve  Extremities.No edema, no cyanosis,pulses 2+ bilaterally.  Labs. CBC Latest Ref Rng & Units 11/11/2016 11/10/2016 10/02/2016  WBC 4.0 - 10.5 K/uL 5.4 4.7 5.1  Hemoglobin 13.0 - 17.0 g/dL 8.6(L) 8.1(L) -  Hematocrit 39.0 - 52.0 % 25.7(L) 23.7(L) 29.6(L)  Platelets 150 - 400 K/uL 200 201 227   BMP Latest Ref Rng & Units 11/11/2016 11/10/2016 11/10/2016  Glucose 65 - 99 mg/dL 182(H) 187(H) 153(H)  BUN 6 - 20 mg/dL 33(H) 47(H) 50(H)  Creatinine 0.61 - 1.24 mg/dL 1.67(H) 2.10(H) 2.56(H)  BUN/Creat Ratio 10 - 24 - - -  Sodium 135 - 145 mmol/L 135 134(L) 132(L)  Potassium 3.5 - 5.1 mmol/L 4.8 4.8 5.3(H)  Chloride 101 - 111 mmol/L 107 107 105  CO2 22 - 32 mmol/L 20(L) 17(L) 17(L)  Calcium 8.9 - 10.3 mg/dL 9.0 8.5(L) 9.1   Assessment/Plan:  Charlene Cowdrey Burdenis a 71 y.o.man with PMHx significant for CKD stage III, type 2 diabetes with neuropathy, hypertension and prostate cancer T2b, sp radiation trx completed in December 2017 Came to ED with complaint of lightheadedness and generalized weakness going on for a few months.  Orthostasis. His symptoms improved today  with fluid resuscitation. He did have a drop in his systolic blood pressure of about 18 without any rise in his heart rate. His lightheadedness along with positive orthostatic vitals were most likely multifactorial including decreased intravascular volume in the setting of poor fluid intake and increased alcohol intake, there might be some element of autonomic dysfunction because of no increase in her heart rate. He was advised to increase his fluid intake and decrease alcohol intake. We will hold his diuretics.  AKI On CKD.  AK I resolved with fluid resuscitation and his creatinine is back to his  Baseline, most likely because of his dehydration. His PCP can arrange a nephrology referral.  Iron deficiency anemia. Most likely multifactorial to because of his chronic kidney disease, excessive alcohol intake, iron deficiency can be due to nutritional deficiency or any chronic blood loss. Although his FOBT was negative but that does not rule out any GI malignancy. He has his colonoscopy scheduled for 69/22/2018. His PCP should be able to follow-up. We will discharge him on iron supplement.  Hypertension. Currently normotensive. We will discharge him on his lisinopril only, we will hold his amlodipine and hydrochlorothiazide. His PCP can adjust his antihypertensives as an outpatient.  Well-controlled diabetes. His CBC remained between 117-166. He denied any recent hypoglycemic event. He will be discharged home on his home dose of  70/35 units twice daily. We will send recommendation to his PCP to loosen up  His A1c goal.  Alcohol abuse. He does not exhibit any sign of withdrawal.  History of prostate cancer. He completed his radiation in December 2017. He has no current symptoms. He follow-up with radiation oncology.   Dispo: Being discharged today.   Lorella Nimrod, MD 11/11/2016, 12:52 PM Pager: [phone]

## 2016-11-13 MED FILL — FOLIC ACID 1 MG TABLET: 1 | 60 days supply | Qty: 60 | Fill #0

## 2016-11-13 MED FILL — FERROUS SULFATE 325 MG TAB: 325 (65 FE) | 30 days supply | Qty: 30 | Fill #0

## 2016-11-14 ENCOUNTER — Encounter: Payer: Self-pay | Admitting: Internal Medicine

## 2016-11-14 ENCOUNTER — Ambulatory Visit (AMBULATORY_SURGERY_CENTER): Payer: Self-pay | Admitting: *Deleted

## 2016-11-14 VITALS — Ht 66.0 in | Wt 157.0 lb

## 2016-11-14 DIAGNOSIS — Z1211 Encounter for screening for malignant neoplasm of colon: Secondary | ICD-10-CM

## 2016-11-14 LAB — METHYLMALONIC ACID, SERUM: METHYLMALONIC ACID, QUANTITATIVE: 283 nmol/L (ref 0–378)

## 2016-11-14 MED ORDER — POLYETHYLENE GLYCOL 3350 17 GM/SCOOP PO POWD: 1.0000 | Freq: Once | ORAL | 3 refills | Status: AC

## 2016-11-14 MED ORDER — BISACODYL 5 MG PO TBEC: 5.0000 mg | DELAYED_RELEASE_TABLET | Freq: Once | ORAL | 0 refills | Status: AC

## 2016-11-14 MED FILL — POLYETHYLENE GLYCOL 3350 PO: 7 days supply | Qty: 255 | Fill #0

## 2016-11-14 NOTE — Progress Notes (Signed)
No egg or soy allergy known to patient  No issues with past sedation with any surgeries  or procedures, no past intubation  No diet pills per patient No home 02 use per patient  No blood thinners per patient  Pt states  issues with constipation -some times goes 2-3 days without having a BM- 11-12-16 MD sent in script for seno-kot 2 tabs BID- pt instructed to use this as directed  No A fib or A flutter  EMMI video not sent as pt has no e mail at home  Discussed several times prep instructions for Monday and Tuesday with pt- he verbalizes he understands instructions  Instructed pt to get his dulcolax and miralax at [facility]

## 2016-11-17 DIAGNOSIS — H25813 Combined forms of age-related cataract, bilateral: Secondary | ICD-10-CM | POA: Diagnosis not present

## 2016-11-17 DIAGNOSIS — H35033 Hypertensive retinopathy, bilateral: Secondary | ICD-10-CM | POA: Diagnosis not present

## 2016-11-17 DIAGNOSIS — E119 Type 2 diabetes mellitus without complications: Secondary | ICD-10-CM | POA: Diagnosis not present

## 2016-11-21 ENCOUNTER — Ambulatory Visit: Payer: Medicare HMO | Attending: Internal Medicine | Admitting: Internal Medicine

## 2016-11-21 ENCOUNTER — Encounter: Payer: Self-pay | Admitting: Internal Medicine

## 2016-11-21 ENCOUNTER — Ambulatory Visit (INDEPENDENT_AMBULATORY_CARE_PROVIDER_SITE_OTHER): Payer: Medicare HMO | Admitting: Sports Medicine

## 2016-11-21 ENCOUNTER — Encounter: Payer: Self-pay | Admitting: Sports Medicine

## 2016-11-21 VITALS — BP 121/73 | HR 85 | Temp 98.0°F | Resp 16 | Wt 156.8 lb

## 2016-11-21 VITALS — BP 136/74 | Ht 64.0 in | Wt 159.0 lb

## 2016-11-21 DIAGNOSIS — M6530 Trigger finger, unspecified finger: Secondary | ICD-10-CM

## 2016-11-21 DIAGNOSIS — N183 Chronic kidney disease, stage 3 (moderate): Secondary | ICD-10-CM | POA: Diagnosis not present

## 2016-11-21 DIAGNOSIS — Z794 Long term (current) use of insulin: Secondary | ICD-10-CM | POA: Insufficient documentation

## 2016-11-21 DIAGNOSIS — E1122 Type 2 diabetes mellitus with diabetic chronic kidney disease: Secondary | ICD-10-CM | POA: Diagnosis not present

## 2016-11-21 DIAGNOSIS — M25511 Pain in right shoulder: Secondary | ICD-10-CM | POA: Insufficient documentation

## 2016-11-21 DIAGNOSIS — R600 Localized edema: Secondary | ICD-10-CM | POA: Diagnosis not present

## 2016-11-21 DIAGNOSIS — IMO0002 Reserved for concepts with insufficient information to code with codable children: Secondary | ICD-10-CM

## 2016-11-21 DIAGNOSIS — N179 Acute kidney failure, unspecified: Secondary | ICD-10-CM | POA: Diagnosis not present

## 2016-11-21 DIAGNOSIS — Z720 Tobacco use: Secondary | ICD-10-CM | POA: Diagnosis not present

## 2016-11-21 DIAGNOSIS — D638 Anemia in other chronic diseases classified elsewhere: Secondary | ICD-10-CM | POA: Insufficient documentation

## 2016-11-21 DIAGNOSIS — I10 Essential (primary) hypertension: Secondary | ICD-10-CM | POA: Diagnosis not present

## 2016-11-21 LAB — POCT UA - MICROALBUMIN
Creatinine, POC: 100 mg/dL
Microalbumin Ur, POC: 150 mg/L

## 2016-11-21 MED ORDER — METHYLPREDNISOLONE ACETATE 40 MG/ML IJ SUSP
40.0000 mg | Freq: Once | INTRAMUSCULAR | Status: AC
  Administered 2016-11-21: 40 mg via INTRA_ARTICULAR

## 2016-11-21 MED ORDER — MEDICAL COMPRESSION STOCKINGS MISC: 1.0000 | Freq: Every day | 0 refills | Status: DC

## 2016-11-21 MED ORDER — FERROUS SULFATE 325 (65 FE) MG PO TABS: 325.0000 mg | ORAL_TABLET | Freq: Every day | ORAL | 3 refills | Status: DC

## 2016-11-21 MED ORDER — DICLOFENAC SODIUM 1 % TD GEL: 2.0000 g | Freq: Four times a day (QID) | TRANSDERMAL | 2 refills | Status: DC

## 2016-11-21 MED ORDER — PRAVASTATIN SODIUM 20 MG PO TABS: 20.0000 mg | ORAL_TABLET | Freq: Every day | ORAL | 3 refills | Status: DC

## 2016-11-21 MED ORDER — AMLODIPINE BESYLATE 5 MG PO TABS: 5.0000 mg | ORAL_TABLET | Freq: Every day | ORAL | 3 refills | Status: DC

## 2016-11-21 MED ORDER — INSULIN ASPART PROT & ASPART (70-30 MIX) 100 UNIT/ML PEN: 5.0000 [IU] | PEN_INJECTOR | Freq: Two times a day (BID) | SUBCUTANEOUS | 2 refills | Status: DC

## 2016-11-21 MED FILL — NOVOLOG MIX 70-30 FLEXPEN S: (70-30) 100 | 29 days supply | Qty: 3 | Fill #0

## 2016-11-21 MED FILL — VOLTAREN 1% GEL: 1 | 12 days supply | Qty: 100 | Fill #0

## 2016-11-21 NOTE — Patient Instructions (Addendum)
Keep your colonoscopy!!  -   Shoulder Pain Many things can cause shoulder pain, including:  An injury.  Moving the arm in the same way again and again (overuse).  Joint pain (arthritis). Follow these instructions at home: Take these actions to help with your pain:  Squeeze a soft ball or a foam pad as much as you can. This helps to prevent swelling. It also makes the arm stronger.  Take over-the-counter and prescription medicines only as told by your doctor.  If told, put ice on the area:  Put ice in a plastic bag.  Place a towel between your skin and the bag.  Leave the ice on for 20 minutes, 2-3 times per day. Stop putting on ice if it does not help with the pain.  If you were given a shoulder sling or immobilizer:  Wear it as told.  Remove it to shower or bathe.  Move your arm as little as possible.  Keep your hand moving. This helps prevent swelling. Contact a doctor if:  Your pain gets worse.  Medicine does not help your pain.  You have new pain in your arm, hand, or fingers. Get help right away if:  Your arm, hand, or fingers:  Tingle.  Are numb.  Are swollen.  Are painful.  Turn white or blue. This information is not intended to replace advice given to you by your health care provider. Make sure you discuss any questions you have with your health care provider. Document Released: 12/13/2007 Document Revised: 02/20/2016 Document Reviewed: 10/19/2014 Elsevier Interactive Patient Education  2017 [place] for Routine Care of Injuries Many injuries can be cared for using rest, ice, compression, and elevation (RICE therapy). Using RICE therapy can help to lessen pain and swelling. It can help your body to heal. Rest  Reduce your normal activities and avoid using the injured part of your body. You can go back to your normal activities when you feel okay and your doctor says it is okay. Ice  Do not put ice on your bare skin.  Put ice  in a plastic bag.  Place a towel between your skin and the bag.  Leave the ice on for 20 minutes, 2-3 times a day. Do this for as long as told by your doctor. Compression  Compression means putting pressure on the injured area. This can be done with an elastic bandage. If an elastic bandage has been applied:  Remove and reapply the bandage every 3-4 hours or as told by your doctor.  Make sure the bandage is not wrapped too tight. Wrap the bandage more loosely if part of your body beyond the bandage is blue, swollen, cold, painful, or loses feeling (numb).  See your doctor if the bandage seems to make your problems worse. Elevation  Elevation means keeping the injured area raised. Raise the injured area above your heart or the center of your chest if you can. When should I get help? You should get help if:  You keep having pain and swelling.  Your symptoms get worse. Get help right away if: You should get help right away if:  You have sudden bad pain at or below the area of your injury.  You have redness or more swelling around your injury.  You have tingling or numbness at or below the injury that does not go away when you take off the bandage. This information is not intended to replace advice given to you by your  health care provider. Make sure you discuss any questions you have with your health care provider. Document Released: 12/13/2007 Document Revised: 05/23/2016 Document Reviewed: 06/03/2014 Elsevier Interactive Patient Education  2017 [employer].  -

## 2016-11-21 NOTE — Progress Notes (Signed)
Robert Sparks, is a 71 y.o. male  OZD:[id number]  QVZ:[id number]  DOB - 03-24-1946  Chief Complaint  Patient presents with  . Referral        Subjective:   Robert Sparks is a 71 y.o. male here today for a follow up visit, last seen 10/02/16 for htn, predm (hx of ooc dm), ckd 3, tob and etoh abuse. Of note, he was admitted in hospital on 11/10/16 - 11/11/16 for orthostasis, iron def anemia, htn.   Did well with hydration.  Prinzide was stopped, and continued on norvasc 5 qd.  He has stopped drinking alcohol since than, but still smoking, perhaps 1/2 ppd.  He is worried about starting dialysis.  He knows he should stop smoking as well, but it is hard.  He has colonoscopy scheduled for next week, encouraged to keep.  His right shoulder bothers him sometimes at night/stiff/sore., sometimes last throughout day.  Patient has No headache, No chest pain, No abdominal pain - No Nausea, No new weakness tingling or numbness, No Cough - SOB.  No problems updated.  ALLERGIES: No Known Allergies  PAST MEDICAL HISTORY: Past Medical History:  Diagnosis Date  . Anemia   . Arthritis    shoulders,feet   . Cataract    per eye dr -has appt 5-11  . CKD (chronic kidney disease), stage III   . Elevated PSA   . GERD (gastroesophageal reflux disease)   . History of ketoacidosis    03-29-2015   . History of sepsis    07-25-2013  non-compliant w/ medication  . Hyperlipidemia   . Hypertension   . Nocturia   . Peripheral neuropathy   . Prostate cancer ([place])   . Type 2 diabetes mellitus with insulin therapy ([place])     MEDICATIONS AT HOME: Prior to Admission medications   Medication Sig Start Date End Date Taking? Authorizing Provider  amLODipine (NORVASC) 5 MG tablet Take 1 tablet (5 mg total) by mouth daily. 11/21/16   Maren Reamer, MD  Ascorbic Acid (VITAMIN C) 1000 MG tablet Take 1,000 mg by mouth daily.    [provider]  aspirin EC 81 MG tablet Take 1 tablet (81 mg total)  by mouth daily. 10/02/16   Maren Reamer, MD  Cholecalciferol (VITAMIN D) 2000 units CAPS Take 2,000 Units by mouth daily.    [provider]  diclofenac sodium (VOLTAREN) 1 % GEL Apply 2 g topically 4 (four) times daily. Unable to tolerate NSAIDs 11/21/16   Maren Reamer, MD  Elastic Bandages & Supports (MEDICAL COMPRESSION STOCKINGS) MISC 1 packet by Does not apply route daily. Size to fit 11/21/16   Lottie Mussel T, MD  ferrous sulfate 325 (65 FE) MG tablet Take 1 tablet (325 mg total) by mouth daily with breakfast. 11/21/16   Maren Reamer, MD  folic acid (FOLVITE) 1 MG tablet Take 1 tablet (1 mg total) by mouth daily. Patient not taking: Reported on 11/14/2016 11/12/16   Lorella Nimrod, MD  gabapentin (NEURONTIN) 300 MG capsule Take 1 capsule (300 mg total) by mouth 4 (four) times daily. Take 1 tab po am and lunch, 2 tabs at night. 10/02/16   Langeland, Dawn T, MD  insulin aspart protamine - aspart (NOVOLOG 70/30 MIX) (70-30) 100 UNIT/ML FlexPen Inject 0.05 mLs (5 Units total) into the skin 2 (two) times daily with a meal. 11/21/16   Langeland, Dawn T, MD  Multiple Vitamin (MULTIVITAMIN WITH MINERALS) TABS tablet Take 1 tablet by mouth daily.  Patient not taking: Reported on 11/14/2016 11/12/16   Lorella Nimrod, MD  NONFORMULARY OR COMPOUNDED ITEM [facility]  [place] Tendonitis Cream- Diclofenac 3%, Baclofen 2%, Bupivacaine 1%, Doxepin 5%, Gabapentin 6%, Ibuprofen 3%, Pentoxifylline 3% Apply 1-2 grams to affected area 3-4 times daily Qty. 120 gm 3 refills Patient not taking: Reported on 11/14/2016 10/30/16   Edrick Kins, DPM  pravastatin (PRAVACHOL) 20 MG tablet Take 1 tablet (20 mg total) by mouth daily. 11/21/16   Langeland, Dawn T, MD  senna-docusate (SENOKOT-S) 8.6-50 MG tablet Take 2 tablets by mouth 2 (two) times daily. Patient not taking: Reported on 11/14/2016 11/11/16   Lorella Nimrod, MD  thiamine 100 MG tablet Take 1 tablet (100 mg total) by mouth daily. Patient not  taking: Reported on 11/14/2016 11/12/16   Lorella Nimrod, MD     Objective:   Vitals:   11/21/16 1338  BP: 121/73  Pulse: 85  Resp: 16  Temp: 98 F (36.7 C)  TempSrc: Oral  SpO2: 96%  Weight: 156 lb 12.8 oz (71.1 kg)    Exam General appearance : Awake, alert, not in any distress. Speech Clear. Not toxic looking, pleasant., obese. HEENT: Atraumatic and Normocephalic, pupils equally reactive to light. Neck: supple, no JVD. No cervical lymphadenopathy.  Chest:Good air entry bilaterally, no added sounds. CVS: S1 S2 regular, no murmurs/gallups or rubs. Abdomen: Bowel sounds active, Non tender and not distended with no gaurding, rigidity or rebound. Extremities: B/L Lower Ext shows trace pedal edema bilat, both legs are warm to touch, rom of motion intact for right shoulder, nttp on exam, but unable to extend shoulder or completely rotate it due to stiffness. Neurology: Awake alert, and oriented X 3, CN II-XII grossly intact, Non focal Skin:No Rash  Data Review Lab Results  Component Value Date   HGBA1C 6.1 10/02/2016   HGBA1C 6.5 06/21/2016   HGBA1C 10.0 11/01/2015    Depression screen PHQ 2/9 11/21/2016 10/02/2016 08/15/2016 06/21/2016 02/23/2016  Decreased Interest 1 2 0 0 0  Down, Depressed, Hopeless 2 0 0 0 0  PHQ - 2 Score 3 2 0 0 0  Altered sleeping 3 0 - - -  Tired, decreased energy 3 3 - - -  Change in appetite 0 3 - - -  Feeling bad or failure about yourself  0 0 - - -  Trouble concentrating 0 0 - - -  Moving slowly or fidgety/restless 0 0 - - -  Suicidal thoughts 0 0 - - -  PHQ-9 Score 9 8 - - -      Assessment & Plan   1. CKD stage 3 due to type 2 diabetes mellitus ([place]) - reassurance provided, d/w pt that good dm and htn control will help slow progression of kidney disease. Also stop smoking will likewise help. - Will rechk bmp today. - Ambulatory referral to Nephrology - Basic metabolic panel - POCT UA - Microalbumin  2. Essential hypertension Controlled  on norvasc 5 qd.   3. Controlled type 2 diabetes mellitus with stage 3 chronic kidney disease, with long-term current use of insulin ([facility]) - well controlled, tol nph 70/30 5 units bid. On this rather than metformin due to his ckd, he had some concerns about cost. Asked him to talk to pharmacy to see if any cheaper alternatives/generics  - POCT UA - Microalbumin  4. Acute renal failure, unspecified acute renal failure type ([place]) rchk bmp  5. Anemia, chronic disease Likely multifactorial.  Ckd, etoh, possible occult bleeding. Now  stopped drinking etoh, encouraged cessation. On iron 325 qd. Has colonoscopy next week, encouraged to keep appt.  6. Tobacco abuse Total cessation recd. For your tobacco abuse: Strongly recommend that he stop smoking back and all other inhaled products right away Call 1-800-Quit-Now to get free nicotine replacement from the state of [place]  7. Pedal edema - low salt diet encouraged - now off hctz for the aki. - rx ted compression stockings during day, take off at night, and elevate legs.  8. Right shoulder pains, suspect oa. He declined xray at this time. Will trial diclofinac cream prn for pain, and muscle stretching exercising. - unable to tol po nsaids due to risk of gi bleed.     Patient have been counseled extensively about nutrition and exercise  Return in about 3 months (around 02/21/2017), or if symptoms worsen or fail to improve.  The patient was given clear instructions to go to ER or return to medical center if symptoms don't improve, worsen or new problems develop. The patient verbalized understanding. The patient was told to call to get lab results if they haven't heard anything in the next week.   This note has been created with Surveyor, quantity. Any transcriptional errors are unintentional.   Maren Reamer, MD, [facility] [place],  [place]   11/21/2016, 2:05 PM

## 2016-11-21 NOTE — Progress Notes (Signed)
   Subjective:    Patient ID: Robert Sparks, male    DOB: 05-Feb-1946, 71 y.o.   MRN: [id number]  HPI chief complaint: Bilateral hand pain  Very pleasant 71 year old male comes in today complaining of pain in multiple fingers in both hands. He complains of pain and locking in both ring fingers as well as his left thumb. Symptoms are intermittent and have been present now for several weeks. No trauma. No fevers or chills. Denies prior occurrences.  Past medical history reviewed Medications reviewed Allergies reviewed    Review of Systems    as above Objective:   Physical Exam  Well-developed, well-nourished. No acute distress Examination of both hands shows active triggering of the ring fingers on both hands. He also has mild triggering of the fifth digit on his right hand. He is unable to completely flex his left thumb and has a rather large palpable nodule at the A1 pulley. No evidence of Dupuytren's. No erythema. No swelling. Good grip strength. Neurovascularly intact distally.      Assessment & Plan:   Multiple trigger fingers, both hands  After consent, I injected both ring finger trigger fingers at the level of the A1 pulley. Patient tolerated this without difficulty. He is warned about transient hyperglycemia with his diabetes. He will also utilize nighttime splinting of the PIP joints to help prevent nighttime locking. Patient will follow-up with me in 3 weeks for reevaluation. If he gets a good response to today's injections then we will consider injecting his left thumb and right fifth finger at follow-up.  Consent obtained and verified. Time-out conducted. Noted no overlying erythema, induration, or other signs of local infection. Skin prepped in a sterile fashion. Topical analgesic spray: Ethyl chloride. Joint: right 4th finger at level of A1 pulley Needle: 25g 5/8 inch Completed without difficulty. Meds: 1cc 1%xylocaine, 1cc (40mg ) depomedrol  Advised to call if  fevers/chills, erythema, induration, drainage, or persistent bleeding.  Consent obtained and verified. Time-out conducted. Noted no overlying erythema, induration, or other signs of local infection. Skin prepped in a sterile fashion. Topical analgesic spray: Ethyl chloride. Joint: left 4th finger at level of A1 pulley Needle: 25g 5/8  inch Completed without difficulty. Meds: 1cc 1% xylocaine, 1cc(40mg ) depomedrol  Advised to call if fevers/chills, erythema, induration, drainage, or persistent bleeding.

## 2016-11-22 LAB — BASIC METABOLIC PANEL
BUN / CREAT RATIO: 16 (ref 10–24)
BUN: 20 mg/dL (ref 8–27)
CO2: 19 mmol/L (ref 18–29)
Calcium: 9.7 mg/dL (ref 8.6–10.2)
Chloride: 105 mmol/L (ref 96–106)
Creatinine, Ser: 1.26 mg/dL (ref 0.76–1.27)
GFR calc non Af Amer: 58 mL/min/{1.73_m2} — ABNORMAL LOW (ref >59)
GFR, EST AFRICAN AMERICAN: 67 mL/min/{1.73_m2} (ref >59)
GLUCOSE: 123 mg/dL — AB (ref 65–99)
Potassium: 4.2 mmol/L (ref 3.5–5.2)
Sodium: 139 mmol/L (ref 134–144)

## 2016-11-22 LAB — CBC WITH DIFFERENTIAL/PLATELET
BASOS: 0 %
Basophils Absolute: 0 10*3/uL (ref 0.0–0.2)
EOS (ABSOLUTE): 0.8 10*3/uL — ABNORMAL HIGH (ref 0.0–0.4)
EOS: 16 %
HEMATOCRIT: 25.8 % — AB (ref 37.5–51.0)
Hemoglobin: 8.3 g/dL — ABNORMAL LOW (ref 13.0–17.7)
Immature Grans (Abs): 0 10*3/uL (ref 0.0–0.1)
Immature Granulocytes: 0 %
LYMPHS ABS: 0.8 10*3/uL (ref 0.7–3.1)
Lymphs: 16 %
MCH: 33.1 pg — AB (ref 26.6–33.0)
MCHC: 32.2 g/dL (ref 31.5–35.7)
MCV: 103 fL — AB (ref 79–97)
MONOS ABS: 0.2 10*3/uL (ref 0.1–0.9)
Monocytes: 4 %
Neutrophils Absolute: 3.2 10*3/uL (ref 1.4–7.0)
Neutrophils: 64 %
Platelets: 272 10*3/uL (ref 150–379)
RBC: 2.51 x10E6/uL — CL (ref 4.14–5.80)
RDW: 14.9 % (ref 12.3–15.4)
WBC: 4.9 10*3/uL (ref 3.4–10.8)

## 2016-11-23 ENCOUNTER — Encounter: Payer: Self-pay | Admitting: Internal Medicine

## 2016-11-24 ENCOUNTER — Encounter: Payer: Self-pay | Admitting: Internal Medicine

## 2016-11-27 ENCOUNTER — Encounter: Payer: Self-pay | Admitting: Internal Medicine

## 2016-11-27 ENCOUNTER — Other Ambulatory Visit: Payer: Self-pay | Admitting: Internal Medicine

## 2016-11-28 ENCOUNTER — Telehealth: Payer: Self-pay

## 2016-11-28 ENCOUNTER — Encounter: Payer: Self-pay | Admitting: Internal Medicine

## 2016-11-28 ENCOUNTER — Ambulatory Visit (AMBULATORY_SURGERY_CENTER): Payer: Medicare HMO | Admitting: Internal Medicine

## 2016-11-28 VITALS — BP 123/80 | HR 75 | Temp 96.8°F | Resp 15 | Ht 64.0 in | Wt 156.0 lb

## 2016-11-28 DIAGNOSIS — Z1212 Encounter for screening for malignant neoplasm of rectum: Secondary | ICD-10-CM

## 2016-11-28 DIAGNOSIS — Z1211 Encounter for screening for malignant neoplasm of colon: Secondary | ICD-10-CM

## 2016-11-28 MED ORDER — SODIUM CHLORIDE 0.9 % IV SOLN: 500.0000 mL | INTRAVENOUS | Status: DC

## 2016-11-28 NOTE — Progress Notes (Signed)
Spontaneous respirations throughout. VSS. Resting comfortably. To PACU on room air. Report to  Celia RN. 

## 2016-11-28 NOTE — Patient Instructions (Signed)
Discharge instructions given. Handouts on diverticulosis and hemorrhoids. Resume previous medications. YOU HAD AN ENDOSCOPIC PROCEDURE TODAY AT THE [facility]:   Refer to the procedure report that was given to you for any specific questions about what was found during the examination.  If the procedure report does not answer your questions, please call your gastroenterologist to clarify.  If you requested that your care partner not be given the details of your procedure findings, then the procedure report has been included in a sealed envelope for you to review at your convenience later.  YOU SHOULD EXPECT: Some feelings of bloating in the abdomen. Passage of more gas than usual.  Walking can help get rid of the air that was put into your GI tract during the procedure and reduce the bloating. If you had a lower endoscopy (such as a colonoscopy or flexible sigmoidoscopy) you may notice spotting of blood in your stool or on the toilet paper. If you underwent a bowel prep for your procedure, you may not have a normal bowel movement for a few days.  Please Note:  You might notice some irritation and congestion in your nose or some drainage.  This is from the oxygen used during your procedure.  There is no need for concern and it should clear up in a day or so.  SYMPTOMS TO REPORT IMMEDIATELY:   Following lower endoscopy (colonoscopy or flexible sigmoidoscopy):  Excessive amounts of blood in the stool  Significant tenderness or worsening of abdominal pains  Swelling of the abdomen that is new, acute  Fever of 100F or higher   For urgent or emergent issues, a gastroenterologist can be reached at any hour by calling [phone].   DIET:  We do recommend a small meal at first, but then you may proceed to your regular diet.  Drink plenty of fluids but you should avoid alcoholic beverages for 24 hours.  ACTIVITY:  You should plan to take it easy for the rest of today and you should  NOT DRIVE or use heavy machinery until tomorrow (because of the sedation medicines used during the test).    FOLLOW UP: Our staff will call the number listed on your records the next business day following your procedure to check on you and address any questions or concerns that you may have regarding the information given to you following your procedure. If we do not reach you, we will leave a message.  However, if you are feeling well and you are not experiencing any problems, there is no need to return our call.  We will assume that you have returned to your regular daily activities without incident.  If any biopsies were taken you will be contacted by phone or by letter within the next 1-3 weeks.  Please call us at [phone] if you have not heard about the biopsies in 3 weeks.    SIGNATURES/CONFIDENTIALITY: You and/or your care partner have signed paperwork which will be entered into your electronic medical record.  These signatures attest to the fact that that the information above on your After Visit Summary has been reviewed and is understood.  Full responsibility of the confidentiality of this discharge information lies with you and/or your care-partner. 

## 2016-11-28 NOTE — Op Note (Signed)
[facility] Patient Name: Robert Sparks Procedure Date: 11/28/2016 8:36 AM MRN: [id number] Endoscopist: Jerene Bears , MD Age: 71 Referring MD:  Date of Birth: 1946/01/14 Gender: Male Account #: [id number] Procedure:                Colonoscopy Indications:              Screening for colorectal malignant neoplasm, This                            is the patient's first colonoscopy Medicines:                Monitored Anesthesia Care Procedure:                Pre-Anesthesia Assessment:                           - Prior to the procedure, a History and Physical                            was performed, and patient medications and                            allergies were reviewed. The patient's tolerance of                            previous anesthesia was also reviewed. The risks                            and benefits of the procedure and the sedation                            options and risks were discussed with the patient.                            All questions were answered, and informed consent                            was obtained. Prior Anticoagulants: The patient has                            taken no previous anticoagulant or antiplatelet                            agents. ASA Grade Assessment: III - A patient with                            severe systemic disease. After reviewing the risks                            and benefits, the patient was deemed in                            satisfactory condition to undergo the procedure.  After obtaining informed consent, the colonoscope                            was passed under direct vision. Throughout the                            procedure, the patient's blood pressure, pulse, and                            oxygen saturations were monitored continuously. The                            Model CF-HQ190L [id number]) scope was introduced                            through the anus and  advanced to the the cecum,                            identified by appendiceal orifice and ileocecal                            valve. The colonoscopy was performed without                            difficulty. The patient tolerated the procedure                            well. The quality of the bowel preparation was                            good. The ileocecal valve, appendiceal orifice, and                            rectum were photographed. Scope In: 5:78:46 AM Scope Out: 9:02:37 AM Scope Withdrawal Time: 0 hours 11 minutes 1 second  Total Procedure Duration: 0 hours 15 minutes 38 seconds  Findings:                 The digital rectal exam was normal.                           Multiple small and large-mouthed diverticula were                            found from ascending colon to sigmoid colon.                           Internal hemorrhoids were found during                            retroflexion. The hemorrhoids were small.                           The exam was otherwise without abnormality. Complications:            No  immediate complications. Estimated Blood Loss:     Estimated blood loss: none. Impression:               - Moderate diverticulosis from ascending colon to                            sigmoid colon.                           - Internal hemorrhoids.                           - The examination was otherwise normal.                           - No specimens collected. Recommendation:           - Patient has a contact number available for                            emergencies. The signs and symptoms of potential                            delayed complications were discussed with the                            patient. Return to normal activities tomorrow.                            Written discharge instructions were provided to the                            patient.                           - Resume previous diet.                           - Continue present  medications.                           - Repeat colonoscopy in 10 years for screening                            purposes. Jerene Bears, MD 11/28/2016 9:04:49 AM This report has been signed electronically.

## 2016-11-28 NOTE — Progress Notes (Signed)
Pt's states no medical or surgical changes since previsit or office visit. 

## 2016-11-28 NOTE — Telephone Encounter (Signed)
Contacted pt to go over lab results pt didn't answer and was unable to lvm   If pt calls back please give results: Kidney function looks better than 2 wks ago. Back to normal range. He remains anemic, He is only mild iron deficient though. This is anemia is due to many reasons. He can try taking iron (ferrous gluconate) 325mg  every day to every other day x 3 months to see if this improves his anemia.

## 2016-11-29 ENCOUNTER — Telehealth: Payer: Self-pay | Admitting: *Deleted

## 2016-11-29 NOTE — Telephone Encounter (Signed)
  Follow up Call-  Call back number 11/28/2016  Post procedure Call Back phone  # [phone] -friend   Permission to leave phone message Yes  Some recent data might be hidden     Patient questions:   VM box is full.

## 2016-12-12 ENCOUNTER — Encounter: Payer: Self-pay | Admitting: Sports Medicine

## 2016-12-12 ENCOUNTER — Ambulatory Visit (INDEPENDENT_AMBULATORY_CARE_PROVIDER_SITE_OTHER): Payer: Medicare HMO | Admitting: Sports Medicine

## 2016-12-12 VITALS — BP 126/88 | Ht 64.0 in | Wt 159.0 lb

## 2016-12-12 DIAGNOSIS — M6530 Trigger finger, unspecified finger: Secondary | ICD-10-CM

## 2016-12-12 DIAGNOSIS — IMO0002 Reserved for concepts with insufficient information to code with codable children: Secondary | ICD-10-CM

## 2016-12-12 DIAGNOSIS — M25511 Pain in right shoulder: Secondary | ICD-10-CM | POA: Diagnosis not present

## 2016-12-12 MED ORDER — METHYLPREDNISOLONE ACETATE 40 MG/ML IJ SUSP
40.0000 mg | Freq: Once | INTRAMUSCULAR | Status: AC
  Administered 2016-12-12: 40 mg via INTRA_ARTICULAR

## 2016-12-12 NOTE — Patient Instructions (Signed)
Dr [facility] [address] [place] [phone]   Wed 12/13/16 at 330p Arrival time is 3pm

## 2016-12-12 NOTE — Progress Notes (Signed)
Subjective:    Patient ID: Robert Sparks, male    DOB: [birth year], 71 y.o.   MRN: [id number]  HPI Robert Sparks is a 71 year old male here for follow up for multiple trigger fingers of both hands and right shoulder pain. He was seen in 5/15, and had had bilateral ring finger injections. He reports that his symptoms have significantly improved since the injections. He still has some catching in the bilateral ring fingers but he is able to extend them without using his other hand. He still has pain in his left thumb due to his trigger finger.   He also notes of right shoulder pain that he has had for a little less than one year. It is an achy pain that is mainly in the anterior shoulder. It does not radiate. Pain is mainly with abduction of his right arm and sometimes at night. No history of trauma to his knowledge and denies any repetitive movements. Denies numbness and tingling of the right extremity. Was given diclofenac cream by PCP which he reports helped a little but he started to get a itchy rash on the right neck in the past few days so he discontinued it. He was told that he has arthritis of his shoulder but has not had imaging done in the past.   Past Medical History: reviewed Medications: reviewed Allergies: reviewed  Review of Systems As noted above     Objective:   Physical Exam Pleasant gentleman, no acute distress  Right shoulder: no asymmetry, swelling or erythema on inspection. On palpation, he has some tenderness at the acromioclavicular joint, otherwise no other tenderness to palpation. With range of motion, pain with abduction above 90% but able to fully abduct. Normal flexion. Decreased internal rotation due to pain compared with the left. 4/5 strength of the right supraspinatus muscle against resistance. 4/5 strength with external rotation with resistance.   Examination of both hands with triggering of the ring fingers on both hands. Mild triggering of the fifth digit of the  right hand. Unable to completely flex his left thumb and is tender to palpation of the at the A1 pulleyof the left thumb. No swelling or erythema. Normal grip strength. Normal radial and ulnar pulses bilaterally. Normal sensation to light touch.    Skin: mildly irritated erythematous skin on right neck near the clavicle consistent with dermatitis.     Assessment & Plan:  Right Shoulder Pain: consistent with partial rotator cuff tear with weakness of the supraspinatus and with external rotation with resistance. After consent, right shoulder was injected at the subacromial space. He was warned with transient hyperglycemia with his history of diabetes. Follow up in 1 month.   Consent obtained and verified. Time-out conducted. Noted no overlying erythema, induration, or other signs of local infection. Skin prepped in a sterile fashion. Topical analgesic spray: Ethyl chloride. Joint: right shoulder subacromial space Needle: 25g, 1.5in Completed without difficulty. Meds: 3cc 1%xylocaine, 1cc(40mg ) depomedrol  Advised to call if fevers/chills, erythema, induration, drainage, or persistent bleeding.  Multiple Trigger Fingers:  We did not inject trigger finger today as patient opted to get shoulder injection first. Will refer to hand surgery (Dr. Grandville Silos) for surgical evaluation for his left thumb trigger finger as it is unlikely that steroid injection will improve this.   Patient seen and evaluated with the resident. I agree with the above plan of care. Patient did get some improvement with his trigger fingers but he still has locking. He also has a  fairly severe stenotic flexor tendon of his left thumb. I recommended surgical consultation with Dr. Grandville Silos. He may need an A1 pulley release. We injected his right shoulder today with cortisone. He tolerated this without difficulty. Patient likely has a chronic rotator cuff tear. We will reevaluate in one month and if he remains symptomatic we will  pursue imaging at that time.

## 2016-12-20 MED FILL — NOVOLOG MIX 70-30 FLEXPEN S: (70-30) 100 | 14 days supply | Qty: 3 | Fill #1

## 2016-12-20 MED FILL — ACCU-CHEK AVIVA PLUS TEST S: 15 days supply | Qty: 50 | Fill #3

## 2016-12-25 DIAGNOSIS — N183 Chronic kidney disease, stage 3 (moderate): Secondary | ICD-10-CM | POA: Diagnosis not present

## 2016-12-25 DIAGNOSIS — R809 Proteinuria, unspecified: Secondary | ICD-10-CM | POA: Diagnosis not present

## 2016-12-25 DIAGNOSIS — N2581 Secondary hyperparathyroidism of renal origin: Secondary | ICD-10-CM | POA: Diagnosis not present

## 2016-12-25 DIAGNOSIS — D631 Anemia in chronic kidney disease: Secondary | ICD-10-CM | POA: Diagnosis not present

## 2016-12-25 DIAGNOSIS — Z720 Tobacco use: Secondary | ICD-10-CM | POA: Diagnosis not present

## 2016-12-25 DIAGNOSIS — I10 Essential (primary) hypertension: Secondary | ICD-10-CM | POA: Diagnosis not present

## 2016-12-26 ENCOUNTER — Other Ambulatory Visit: Payer: Self-pay | Admitting: Nephrology

## 2016-12-26 DIAGNOSIS — N183 Chronic kidney disease, stage 3 unspecified: Secondary | ICD-10-CM

## 2016-12-26 DIAGNOSIS — M65342 Trigger finger, left ring finger: Secondary | ICD-10-CM | POA: Diagnosis not present

## 2016-12-26 DIAGNOSIS — M65341 Trigger finger, right ring finger: Secondary | ICD-10-CM | POA: Diagnosis not present

## 2016-12-26 DIAGNOSIS — M65312 Trigger thumb, left thumb: Secondary | ICD-10-CM | POA: Diagnosis not present

## 2016-12-26 DIAGNOSIS — M65351 Trigger finger, right little finger: Secondary | ICD-10-CM | POA: Diagnosis not present

## 2017-01-03 ENCOUNTER — Ambulatory Visit
Admission: RE | Admit: 2017-01-03 | Discharge: 2017-01-03 | Disposition: A | Discharge status: Home / Self Care | Payer: Medicare HMO | Source: Ambulatory Visit | Attending: Nephrology | Admitting: Nephrology

## 2017-01-03 ENCOUNTER — Other Ambulatory Visit: Payer: Self-pay | Admitting: Internal Medicine

## 2017-01-03 DIAGNOSIS — N183 Chronic kidney disease, stage 3 unspecified: Secondary | ICD-10-CM

## 2017-01-03 MED FILL — PRAVASTATIN NA 20 MG TAB: 20 | 30 days supply | Qty: 30 | Fill #0

## 2017-01-03 MED FILL — ACCU-CHEK AVIVA PLUS TEST S: 15 days supply | Qty: 50 | Fill #4

## 2017-01-03 MED FILL — NOVOLOG MIX 70-30 FLEXPEN S: (70-30) 100 | 14 days supply | Qty: 3 | Fill #2

## 2017-01-03 MED FILL — AMLODIPINE BESYLATE 5 MG TA: 5 | 30 days supply | Qty: 30 | Fill #0

## 2017-01-03 MED FILL — GABAPENTIN 300 MG CAPSULE: 300 | 30 days supply | Qty: 120 | Fill #2

## 2017-01-03 MED FILL — FERROUS SULFATE 325 MG TAB: 325 (65 FE) | 30 days supply | Qty: 30 | Fill #0

## 2017-01-16 ENCOUNTER — Ambulatory Visit: Payer: Medicare HMO | Admitting: Sports Medicine

## 2017-01-24 DIAGNOSIS — M65312 Trigger thumb, left thumb: Secondary | ICD-10-CM | POA: Diagnosis not present

## 2017-01-29 ENCOUNTER — Ambulatory Visit (INDEPENDENT_AMBULATORY_CARE_PROVIDER_SITE_OTHER): Payer: Medicare HMO | Admitting: Podiatry

## 2017-01-29 ENCOUNTER — Other Ambulatory Visit: Payer: Self-pay | Admitting: Internal Medicine

## 2017-01-29 DIAGNOSIS — M79676 Pain in unspecified toe(s): Secondary | ICD-10-CM | POA: Diagnosis not present

## 2017-01-29 DIAGNOSIS — B351 Tinea unguium: Secondary | ICD-10-CM | POA: Diagnosis not present

## 2017-01-29 DIAGNOSIS — L84 Corns and callosities: Secondary | ICD-10-CM

## 2017-01-29 DIAGNOSIS — E0842 Diabetes mellitus due to underlying condition with diabetic polyneuropathy: Secondary | ICD-10-CM | POA: Diagnosis not present

## 2017-01-29 MED FILL — AMLODIPINE BESYLATE 5 MG TA: 5 | 30 days supply | Qty: 30 | Fill #1

## 2017-01-29 MED FILL — FERROUS SULFATE 325 MG TAB: 325 (65 FE) | 30 days supply | Qty: 30 | Fill #2

## 2017-01-29 MED FILL — PRAVASTATIN NA 20 MG TAB: 20 | 30 days supply | Qty: 30 | Fill #1

## 2017-01-29 MED FILL — NOVOLOG MIX 70-30 FLEXPEN S: (70-30) 100 | 30 days supply | Qty: 3 | Fill #3

## 2017-01-29 MED FILL — ACCU-CHEK AVIVA PLUS TEST S: 15 days supply | Qty: 50 | Fill #5

## 2017-01-29 NOTE — Progress Notes (Signed)
   SUBJECTIVE Patient with a history of diabetes mellitus presents to office today complaining of elongated, thickened nails. Pain while ambulating in shoes. Patient is unable to trim their own nails.  Patient also has a complaint of a painful callus lesion to the right plantar hallux.  OBJECTIVE General Patient is awake, alert, and oriented x 3 and in no acute distress. Derm hyperkeratotic skin lesion noted to the plantar aspect of the right great toe. Recent pain with palpation also noted. Pain during ambulation. Skin is dry and supple bilateral. Negative open lesions or macerations. Remaining integument unremarkable. Nails are tender, long, thickened and dystrophic with subungual debris, consistent with onychomycosis, 1-5 bilateral. No signs of infection noted. Vasc  DP and PT pedal pulses palpable bilaterally. Temperature gradient within normal limits.  Neuro Epicritic and protective threshold sensation diminished bilaterally.  Musculoskeletal Exam No symptomatic pedal deformities noted bilateral. Muscular strength within normal limits.  ASSESSMENT 1. Diabetes Mellitus w/ peripheral neuropathy 2. Onychomycosis of nail due to dermatophyte bilateral 3. Painful pre-ulcerative callus lesion right great toe  PLAN OF CARE 1. Patient evaluated today. 2. Instructed to maintain good pedal hygiene and foot care. Stressed importance of controlling blood sugar.  3. Mechanical debridement of nails 1-5 bilaterally performed using a nail nipper. Filed with dremel without incident.  4. Excisional debridement of the painful callus lesion to the right great toe was performed using a tissue nipper without incident or bleeding. 5. Today authorization for diabetic shoes was initiated today. The patient was not molded today for custom insoles.  6. Return to clinic in 3 months     Edrick Kins, DPM [facility]  Dr. Edrick Kins, [address]                                         [place], [place] [address]                Office [phone]  Fax [phone]

## 2017-01-31 ENCOUNTER — Encounter: Payer: Self-pay | Admitting: Hematology and Oncology

## 2017-01-31 ENCOUNTER — Telehealth: Payer: Self-pay | Admitting: Hematology and Oncology

## 2017-01-31 NOTE — Telephone Encounter (Signed)
Hem appt has been scheduled for the pt to see Dr. Lebron Conners on 8/3 at 1pm per pt request. Insurance and address verified. Letter mailed to the pt and faxed to the referring.

## 2017-02-09 ENCOUNTER — Ambulatory Visit (HOSPITAL_BASED_OUTPATIENT_CLINIC_OR_DEPARTMENT_OTHER): Payer: Medicare HMO | Admitting: Hematology and Oncology

## 2017-02-09 ENCOUNTER — Encounter: Payer: Self-pay | Admitting: Hematology and Oncology

## 2017-02-09 ENCOUNTER — Ambulatory Visit (HOSPITAL_BASED_OUTPATIENT_CLINIC_OR_DEPARTMENT_OTHER): Payer: Medicare HMO

## 2017-02-09 ENCOUNTER — Telehealth: Payer: Self-pay | Admitting: Hematology and Oncology

## 2017-02-09 VITALS — BP 148/76 | HR 88 | Temp 98.5°F | Resp 18 | Ht 64.0 in | Wt 159.1 lb

## 2017-02-09 DIAGNOSIS — D472 Monoclonal gammopathy: Secondary | ICD-10-CM

## 2017-02-09 DIAGNOSIS — K1321 Leukoplakia of oral mucosa, including tongue: Secondary | ICD-10-CM | POA: Insufficient documentation

## 2017-02-09 LAB — CBC & DIFF AND RETIC
BASO%: 0.3 % (ref 0.0–2.0)
Basophils Absolute: 0 10*3/uL (ref 0.0–0.1)
EOS ABS: 0.6 10*3/uL — AB (ref 0.0–0.5)
EOS%: 8.8 % — ABNORMAL HIGH (ref 0.0–7.0)
HEMATOCRIT: 31.4 % — AB (ref 38.4–49.9)
HGB: 10.6 g/dL — ABNORMAL LOW (ref 13.0–17.1)
IMMATURE RETIC FRACT: 3.4 % (ref 3.00–10.60)
LYMPH%: 12.1 % — AB (ref 14.0–49.0)
MCH: 33.9 pg — AB (ref 27.2–33.4)
MCHC: 33.8 g/dL (ref 32.0–36.0)
MCV: 100.3 fL — ABNORMAL HIGH (ref 79.3–98.0)
MONO#: 0.3 10*3/uL (ref 0.1–0.9)
MONO%: 5.4 % (ref 0.0–14.0)
NEUT%: 73.4 % (ref 39.0–75.0)
NEUTROS ABS: 4.7 10*3/uL (ref 1.5–6.5)
PLATELETS: 194 10*3/uL (ref 140–400)
RBC: 3.13 10*6/uL — AB (ref 4.20–5.82)
RDW: 15 % — ABNORMAL HIGH (ref 11.0–14.6)
RETIC CT ABS: 62.29 10*3/uL (ref 34.80–93.90)
Retic %: 1.99 % — ABNORMAL HIGH (ref 0.80–1.80)
WBC: 6.3 10*3/uL (ref 4.0–10.3)
lymph#: 0.8 10*3/uL — ABNORMAL LOW (ref 0.9–3.3)
nRBC: 0 % (ref 0–0)

## 2017-02-09 LAB — COMPREHENSIVE METABOLIC PANEL
ALT: 16 U/L (ref 0–55)
ANION GAP: 12 meq/L — AB (ref 3–11)
AST: 18 U/L (ref 5–34)
Albumin: 3.9 g/dL (ref 3.5–5.0)
Alkaline Phosphatase: 97 U/L (ref 40–150)
BILIRUBIN TOTAL: 0.37 mg/dL (ref 0.20–1.20)
BUN: 30.4 mg/dL — AB (ref 7.0–26.0)
CALCIUM: 10.1 mg/dL (ref 8.4–10.4)
CHLORIDE: 105 meq/L (ref 98–109)
CO2: 20 mEq/L — ABNORMAL LOW (ref 22–29)
CREATININE: 1.8 mg/dL — AB (ref 0.7–1.3)
EGFR: 42 mL/min/{1.73_m2} — ABNORMAL LOW (ref >90)
Glucose: 154 mg/dl — ABNORMAL HIGH (ref 70–140)
Potassium: 3.9 mEq/L (ref 3.5–5.1)
Sodium: 137 mEq/L (ref 136–145)
TOTAL PROTEIN: 8 g/dL (ref 6.4–8.3)

## 2017-02-09 LAB — LACTATE DEHYDROGENASE: LDH: 131 U/L (ref 125–245)

## 2017-02-09 LAB — MORPHOLOGY: PLT EST: ADEQUATE

## 2017-02-09 NOTE — Progress Notes (Signed)
[facility] New Visit:  Assessment: MGUS (monoclonal gammopathy of unknown significance) 71 year old male with chronic renal insufficiency and some generalized symptoms of night sweats and fatigue referred for discovery of a monoclonal gammopathy of unknown significance. The type of monoclonal protein is unclear, patient has elevation of both kappa and lambda light chains with lambda light chain predominance. No localizing skeletal findings at this time.  There is suspicion for multiple myeloma. Evaluation is needed and we'll start with repeat of the lab work and obtaining off skeletal survey.  Plan: --Labs today --Skeletal Survey --Return to clinic following this evaluation test results  Oral leukoplakia A 1.5 cm patch of white mucosal area found in the mouth on the left side. Considering previous history of tobacco abuse, concerning for possible leukoplakia or squamous cell carcinoma.  Plan: --Referral to ENT for evaluation  Voice recognition software was used and creation of this note. Despite my best effort at editing the text, some misspelling/errors may have occurred.  Orders Placed This Encounter  Procedures  . DG Bone Survey Met    Standing Status:   Future    Standing Expiration Date:   04/11/2018    Order Specific Question:   Reason for Exam (SYMPTOM  OR DIAGNOSIS REQUIRED)    Answer:   Evaluation for multiple myeloma    Order Specific Question:   Preferred imaging location?    Answer:   [facility]    Order Specific Question:   Radiology Contrast Protocol - do NOT remove file path    Answer:   [email or web address]  . CBC & Diff and Retic    Standing Status:   Future    Number of Occurrences:   1    Standing Expiration Date:   02/09/2018  . Morphology    Standing Status:   Future    Number of Occurrences:   1    Standing Expiration Date:   02/09/2018  . Comprehensive metabolic panel    Standing Status:    Future    Number of Occurrences:   1    Standing Expiration Date:   02/09/2018  . Lactate dehydrogenase (LDH)    Standing Status:   Future    Number of Occurrences:   1    Standing Expiration Date:   02/09/2018  . Beta 2 microglobulin    Standing Status:   Future    Number of Occurrences:   1    Standing Expiration Date:   02/09/2018  . Multiple Myeloma Panel (SPEP&IFE w/QIG)    Standing Status:   Future    Number of Occurrences:   1    Standing Expiration Date:   02/09/2018  . Kappa/lambda light chains    Standing Status:   Future    Number of Occurrences:   1    Standing Expiration Date:   02/09/2018  . 24-Hr Ur UPEP/UIFE/Light Chains/TP    Standing Status:   Future    Number of Occurrences:   1    Standing Expiration Date:   02/09/2018  . Ambulatory referral to ENT    Referral Priority:   Routine    Referral Type:   Consultation    Referral Reason:   Specialty Services Required    Requested Specialty:   Otolaryngology    Number of Visits Requested:   1    All questions were answered.   The patient knows to call the clinic with any problems, questions or concerns.  This note was electronically  signed.    History of Presenting Illness Robert Sparks 71 y.o. presenting to the [facility] for evaluation of newly discovered monoclonal gammopathy of unknown significance discovered during evaluation of renal disease. At this time, Mr. Bartow reports having some night sweats, but no weight loss. He indicates weakness in bilateral thighs associated with the feet and legs. Complains of constipation, but denies any numbness or tingling in hands and feet. Denies any new musculoskeletal pain. No chest pain, shortness of breath or cough. No nausea, vomiting, abdominal pain, diarrhea, or early satiety.  Oncological/hematological History: --Labs, 10/28/15: WBC 6.0, Hgb 10.3, Plt 245;   --Labs, 10/02/16: WBC 5.1, Hgb 10.0, Plt 227; ANA -- negative --Labs, 11/10/16: tProt 6.3, Alb 3.4, Ca 9.1,  Cr 2.56, AP 82; WBC 4.7, Hgb 8.1, Plt 201; Fe 43, FeSat 16%, TIBC 272, Ferritin 476, Vit B12 199, MMA 283; TSH 1.57 --Labs, 12/25/16: tProt 7.0, Alb 4.1, Ca 9.9, Cr 1.47, AP ...; SPEP -- M-spike 0.8g/dL; kappa 40.7, lambda 305.1, KLR 0.13; WBC 3.4, Hgb 13.2, Plt 154;  Medical History: Past Medical History:  Diagnosis Date  . Anemia   . Arthritis    shoulders,feet   . Cataract    per eye dr -has appt 5-11  . CKD (chronic kidney disease), stage III   . Elevated PSA   . History of ketoacidosis    03-29-2015   . History of sepsis    07-25-2013  non-compliant w/ medication  . Hyperlipidemia   . Hypertension   . Nocturia   . Peripheral neuropathy   . Prostate cancer ([place])   . Type 2 diabetes mellitus with insulin therapy [facility])     Surgical History: Past Surgical History:  Procedure Laterality Date  . CIRCUMCISION  1990's  . PROSTATE BIOPSY N/A 12/21/2015   Procedure: BIOPSY TRANSRECTAL ULTRASONIC PROSTATE (TUBP);  Surgeon: Festus Aloe, MD;  Location: [facility];  Service: Urology;  Laterality: N/A;    Family History: Family History  Problem Relation Age of Onset  . Cancer Neg Hx   . Colon cancer Neg Hx   . Colon polyps Neg Hx   . Esophageal cancer Neg Hx   . Rectal cancer Neg Hx   . Stomach cancer Neg Hx     Social History: Social History   Social History  . Marital status: Single    Spouse name: N/A  . Number of children: N/A  . Years of education: N/A   Occupational History  . Not on file.   Social History Main Topics  . Smoking status: Current Every Day Smoker    Packs/day: 0.25    Years: 50.00    Types: Cigarettes  . Smokeless tobacco: Never Used     Comment: 1 ppwk-4-5 a day   . Alcohol use 0.6 oz/week    1 Cans of beer per week     Comment: 1 every day-quit couple weeks ago  . Drug use: No  . Sexual activity: Not Currently   Other Topics Concern  . Not on file   Social History Narrative  . No narrative on file     Allergies: No Known Allergies  Medications:  Current Outpatient Prescriptions  Medication Sig Dispense Refill  . ACCU-CHEK AVIVA PLUS test strip See admin instructions.  12  . amLODipine (NORVASC) 5 MG tablet Take 1 tablet (5 mg total) by mouth daily. 90 tablet 3  . Ascorbic Acid (VITAMIN C) 1000 MG tablet Take 1,000 mg by mouth daily.    [name]  aspirin EC 81 MG tablet Take 1 tablet (81 mg total) by mouth daily. 90 tablet 3  . Cholecalciferol (VITAMIN D) 2000 units CAPS Take 2,000 Units by mouth daily.    . diclofenac sodium (VOLTAREN) 1 % GEL Apply 2 g topically 4 (four) times daily. Unable to tolerate NSAIDs (Patient not taking: Reported on 11/28/2016) 100 g 2  . Elastic Bandages & Supports (MEDICAL COMPRESSION STOCKINGS) MISC 1 packet by Does not apply route daily. Size to fit 1 each 0  . ferrous sulfate 325 (65 FE) MG tablet Take 1 tablet (325 mg total) by mouth daily with breakfast. 60 tablet 3  . folic acid (FOLVITE) 1 MG tablet Take 1 tablet (1 mg total) by mouth daily. (Patient not taking: Reported on 11/14/2016) 60 tablet 0  . gabapentin (NEURONTIN) 300 MG capsule Take 1 capsule (300 mg total) by mouth 4 (four) times daily. Take 1 tab po am and lunch, 2 tabs at night. (Patient not taking: Reported on 02/09/2017) 120 capsule 2  . insulin aspart protamine - aspart (NOVOLOG 70/30 MIX) (70-30) 100 UNIT/ML FlexPen Inject 0.05 mLs (5 Units total) into the skin 2 (two) times daily with a meal. 6 mL 2  . Multiple Vitamin (MULTIVITAMIN WITH MINERALS) TABS tablet Take 1 tablet by mouth daily. (Patient not taking: Reported on 02/09/2017) 60 tablet 2  . NONFORMULARY OR COMPOUNDED ITEM [facility]  [place] Tendonitis Cream- Diclofenac 3%, Baclofen 2%, Bupivacaine 1%, Doxepin 5%, Gabapentin 6%, Ibuprofen 3%, Pentoxifylline 3% Apply 1-2 grams to affected area 3-4 times daily Qty. 120 gm 3 refills (Patient not taking: Reported on 11/14/2016) 1 each 0  . pravastatin (PRAVACHOL) 20 MG tablet Take 1  tablet (20 mg total) by mouth daily. 90 tablet 3  . senna-docusate (SENOKOT-S) 8.6-50 MG tablet Take 2 tablets by mouth 2 (two) times daily. 60 tablet 2  . thiamine 100 MG tablet Take 1 tablet (100 mg total) by mouth daily. (Patient not taking: Reported on 11/14/2016) 60 tablet 0   Current Facility-Administered Medications  Medication Dose Route Frequency Provider Last Rate Last Dose  . 0.9 %  sodium chloride infusion  500 mL Intravenous Continuous Pyrtle, Lajuan Lines, MD        Review of Systems: Review of Systems  Constitutional: Positive for diaphoresis and fatigue. Negative for unexpected weight change.  Gastrointestinal: Positive for constipation.  Neurological: Positive for extremity weakness.  All other systems reviewed and are negative.    PHYSICAL EXAMINATION Blood pressure (!) 148/76, pulse 88, temperature 98.5 F (36.9 C), temperature source Oral, resp. rate 18, height '5\' 4"'  (1.626 m), weight 159 lb 1.6 oz (72.2 kg), SpO2 100 %.  ECOG PERFORMANCE STATUS: 1 - Symptomatic but completely ambulatory  Physical Exam  Constitutional: He is oriented to person, place, and time and well-developed, well-nourished, and in no distress. No distress.  HENT:  Head: Normocephalic and atraumatic.  Patient still has some of his lower front teeth left. These appear to be in good health without evidence of infection. Dissemination of the oral cavity also reveals presence of a white patch over the posterior left lower gum without ulceration or bleeding. No other areas of abnormalities noted.  Eyes: Pupils are equal, round, and reactive to light. Conjunctivae and EOM are normal. No scleral icterus.  Neck: No thyromegaly present.  Cardiovascular: Normal rate and regular rhythm.   No murmur heard. Pulmonary/Chest: Effort normal and breath sounds normal. No respiratory distress. He has no wheezes.  Abdominal: Soft. He exhibits no  distension and no mass. There is no tenderness.  Musculoskeletal: He  exhibits no edema or tenderness.  Lymphadenopathy:    He has no cervical adenopathy.  Neurological: He is alert and oriented to person, place, and time. He has normal reflexes. No cranial nerve deficit.  Skin: Skin is warm and dry. No rash noted. He is not diaphoretic. No erythema.     LABORATORY DATA: I have personally reviewed the data as listed: Appointment on 02/09/2017  Component Date Value Ref Range Status  . WBC 02/09/2017 6.3  4.0 - 10.3 10e3/uL Final  . NEUT# 02/09/2017 4.7  1.5 - 6.5 10e3/uL Final  . HGB 02/09/2017 10.6* 13.0 - 17.1 g/dL Final  . HCT 02/09/2017 31.4* 38.4 - 49.9 % Final  . Platelets 02/09/2017 194  140 - 400 10e3/uL Final  . MCV 02/09/2017 100.3* 79.3 - 98.0 fL Final  . MCH 02/09/2017 33.9* 27.2 - 33.4 pg Final  . MCHC 02/09/2017 33.8  32.0 - 36.0 g/dL Final  . RBC 02/09/2017 3.13* 4.20 - 5.82 10e6/uL Final  . RDW 02/09/2017 15.0* 11.0 - 14.6 % Final  . lymph# 02/09/2017 0.8* 0.9 - 3.3 10e3/uL Final  . MONO# 02/09/2017 0.3  0.1 - 0.9 10e3/uL Final  . Eosinophils Absolute 02/09/2017 0.6* 0.0 - 0.5 10e3/uL Final  . Basophils Absolute 02/09/2017 0.0  0.0 - 0.1 10e3/uL Final  . NEUT% 02/09/2017 73.4  39.0 - 75.0 % Final  . LYMPH% 02/09/2017 12.1* 14.0 - 49.0 % Final  . MONO% 02/09/2017 5.4  0.0 - 14.0 % Final  . EOS% 02/09/2017 8.8* 0.0 - 7.0 % Final  . BASO% 02/09/2017 0.3  0.0 - 2.0 % Final  . nRBC 02/09/2017 0  0 - 0 % Final  . Retic % 02/09/2017 1.99* 0.80 - 1.80 % Final  . Retic Ct Abs 02/09/2017 62.29  34.80 - 93.90 10e3/uL Final  . Immature Retic Fract 02/09/2017 3.40  3.00 - 10.60 % Final  . Polychromasia 02/09/2017 Slight  Slight Final  . Ovalocytes 02/09/2017 Few  Negative Final  . White Cell Comments 02/09/2017 C/W auto diff   Final  . PLT EST 02/09/2017 Adequate  Adequate Final  . Sodium 02/09/2017 137  136 - 145 mEq/L Final  . Potassium 02/09/2017 3.9  3.5 - 5.1 mEq/L Final  . Chloride 02/09/2017 105  98 - 109 mEq/L Final  . CO2 02/09/2017  20* 22 - 29 mEq/L Final  . Glucose 02/09/2017 154* 70 - 140 mg/dl Final   Glucose reference range is for nonfasting patients. Fasting glucose reference range is 70- 100.  [name] BUN 02/09/2017 30.4* 7.0 - 26.0 mg/dL Final  . Creatinine 02/09/2017 1.8* 0.7 - 1.3 mg/dL Final  . Total Bilirubin 02/09/2017 0.37  0.20 - 1.20 mg/dL Final  . Alkaline Phosphatase 02/09/2017 97  40 - 150 U/L Final  . AST 02/09/2017 18  5 - 34 U/L Final  . ALT 02/09/2017 16  0 - 55 U/L Final  . Total Protein 02/09/2017 8.0  6.4 - 8.3 g/dL Final  . Albumin 02/09/2017 3.9  3.5 - 5.0 g/dL Final  . Calcium 02/09/2017 10.1  8.4 - 10.4 mg/dL Final  . Anion Gap 02/09/2017 12* 3 - 11 mEq/L Final  . EGFR 02/09/2017 42* >90 ml/min/1.73 m2 Final   eGFR is calculated using the CKD-EPI Creatinine Equation (2009)  . LDH 02/09/2017 131  125 - 245 U/L Final         [name], MD

## 2017-02-09 NOTE — Assessment & Plan Note (Signed)
71 year old male with chronic renal insufficiency and some generalized symptoms of night sweats and fatigue referred for discovery of a monoclonal gammopathy of unknown significance. The type of monoclonal protein is unclear, patient has elevation of both kappa and lambda light chains with lambda light chain predominance. No localizing skeletal findings at this time.  There is suspicion for multiple myeloma. Evaluation is needed and we'll start with repeat of the lab work and obtaining off skeletal survey.  Plan: --Labs today --Skeletal Survey --Return to clinic following this evaluation test results

## 2017-02-09 NOTE — Assessment & Plan Note (Signed)
A 1.5 cm patch of white mucosal area found in the mouth on the left side. Considering previous history of tobacco abuse, concerning for possible leukoplakia or squamous cell carcinoma.  Plan: --Referral to ENT for evaluation

## 2017-02-09 NOTE — Telephone Encounter (Signed)
Gave patient avs report and appointments for August and appointment with Dr. Redmond Baseman at [facility] 8/27  @ 2pm - spoke with Ellison Hughs. Central radiology will call re bone survey.

## 2017-02-10 LAB — BETA 2 MICROGLOBULIN, SERUM: Beta-2: 3.1 mg/L — ABNORMAL HIGH (ref 0.6–2.4)

## 2017-02-12 DIAGNOSIS — D472 Monoclonal gammopathy: Secondary | ICD-10-CM | POA: Diagnosis not present

## 2017-02-12 LAB — KAPPA/LAMBDA LIGHT CHAINS
Ig Kappa Free Light Chain: 33.5 mg/L — ABNORMAL HIGH (ref 3.3–19.4)
Ig Lambda Free Light Chain: 433.5 mg/L — ABNORMAL HIGH (ref 5.7–26.3)
Kappa/Lambda FluidC Ratio: 0.08 — ABNORMAL LOW (ref 0.26–1.65)

## 2017-02-13 LAB — UPEP/UIFE/LIGHT CHAINS/TP, 24-HR UR
% BETA, Urine: 12.2 %
ALBUMIN, U: 55.2 %
ALPHA 1 URINE: 3 %
ALPHA-2-GLOBULIN, U: 21.9 %
Free Kappa Lt Chains,Ur: 136 mg/L — ABNORMAL HIGH (ref 1.35–24.19)
Free Lambda Lt Chains,Ur: 177 mg/L — ABNORMAL HIGH (ref 0.24–6.66)
GAMMA GLOBULIN URINE: 7.7 %
KAPPA/LAMBDA RATIO, U: 0.77 — AB (ref 2.04–10.37)
M-SPIKE, %: 12.5 % — ABNORMAL HIGH
M-Spike, mg/24 hr: 66 mg/24 hr — ABNORMAL HIGH
PROTEIN 24H UR: 531 mg/(24.h) — AB (ref 30–150)
PROTEIN,TOTAL,URINE: 28.7 mg/dL

## 2017-02-13 LAB — MULTIPLE MYELOMA PANEL, SERUM
ALBUMIN SERPL ELPH-MCNC: 4.1 g/dL (ref 2.9–4.4)
ALPHA2 GLOB SERPL ELPH-MCNC: 1 g/dL (ref 0.4–1.0)
Albumin/Glob SerPl: 1.3 (ref 0.7–1.7)
Alpha 1: 0.2 g/dL (ref 0.0–0.4)
B-GLOBULIN SERPL ELPH-MCNC: 1.5 g/dL — AB (ref 0.7–1.3)
GAMMA GLOB SERPL ELPH-MCNC: 0.6 g/dL (ref 0.4–1.8)
GLOBULIN, TOTAL: 3.3 g/dL (ref 2.2–3.9)
IGG (IMMUNOGLOBIN G), SERUM: 666 mg/dL — AB (ref 700–1600)
IgA, Qn, Serum: 1220 mg/dL — ABNORMAL HIGH (ref 61–437)
IgM, Qn, Serum: 52 mg/dL (ref 20–172)
M PROTEIN SERPL ELPH-MCNC: 0.7 g/dL — AB
TOTAL PROTEIN: 7.4 g/dL (ref 6.0–8.5)

## 2017-02-14 ENCOUNTER — Ambulatory Visit: Payer: Medicare HMO

## 2017-02-14 MED FILL — ACCU-CHEK AVIVA PLUS TEST S: 15 days supply | Qty: 50 | Fill #6

## 2017-02-15 ENCOUNTER — Other Ambulatory Visit: Payer: Self-pay

## 2017-02-15 MED ORDER — GABAPENTIN 300 MG PO CAPS: 300.0000 mg | ORAL_CAPSULE | Freq: Four times a day (QID) | ORAL | 2 refills | Status: DC

## 2017-02-15 MED FILL — GABAPENTIN 300 MG CAPSULE: 300 | 30 days supply | Qty: 120 | Fill #0

## 2017-02-16 ENCOUNTER — Ambulatory Visit (HOSPITAL_COMMUNITY)
Admission: RE | Admit: 2017-02-16 | Discharge: 2017-02-16 | Disposition: A | Discharge status: Home / Self Care | Payer: Medicare HMO | Source: Ambulatory Visit | Attending: Hematology and Oncology | Admitting: Hematology and Oncology

## 2017-02-16 DIAGNOSIS — D472 Monoclonal gammopathy: Secondary | ICD-10-CM | POA: Diagnosis not present

## 2017-02-16 DIAGNOSIS — M47812 Spondylosis without myelopathy or radiculopathy, cervical region: Secondary | ICD-10-CM | POA: Diagnosis not present

## 2017-02-19 DIAGNOSIS — N183 Chronic kidney disease, stage 3 (moderate): Secondary | ICD-10-CM | POA: Diagnosis not present

## 2017-02-20 ENCOUNTER — Ambulatory Visit: Payer: Medicare HMO | Admitting: Sports Medicine

## 2017-02-23 ENCOUNTER — Ambulatory Visit (HOSPITAL_BASED_OUTPATIENT_CLINIC_OR_DEPARTMENT_OTHER): Payer: Medicare HMO | Admitting: Hematology and Oncology

## 2017-02-23 ENCOUNTER — Ambulatory Visit: Payer: Medicare HMO

## 2017-02-23 ENCOUNTER — Encounter: Payer: Self-pay | Admitting: Hematology and Oncology

## 2017-02-23 ENCOUNTER — Telehealth: Payer: Self-pay | Admitting: Hematology and Oncology

## 2017-02-23 VITALS — BP 118/71 | HR 87 | Temp 98.8°F | Resp 18 | Ht 64.0 in | Wt 157.9 lb

## 2017-02-23 DIAGNOSIS — C9001 Multiple myeloma in remission: Secondary | ICD-10-CM | POA: Insufficient documentation

## 2017-02-23 DIAGNOSIS — C9000 Multiple myeloma not having achieved remission: Secondary | ICD-10-CM | POA: Diagnosis not present

## 2017-02-23 DIAGNOSIS — I10 Essential (primary) hypertension: Secondary | ICD-10-CM

## 2017-02-23 MED ORDER — PROCHLORPERAZINE MALEATE 10 MG PO TABS: 10.0000 mg | ORAL_TABLET | Freq: Four times a day (QID) | ORAL | 1 refills | Status: DC | PRN

## 2017-02-23 MED ORDER — LORAZEPAM 0.5 MG PO TABS: 0.5000 mg | ORAL_TABLET | Freq: Four times a day (QID) | ORAL | 0 refills | Status: DC | PRN

## 2017-02-23 MED ORDER — LIDOCAINE-PRILOCAINE 2.5-2.5 % EX CREA: TOPICAL_CREAM | CUTANEOUS | 3 refills | Status: DC

## 2017-02-23 MED ORDER — ACYCLOVIR 400 MG PO TABS: 400.0000 mg | ORAL_TABLET | Freq: Two times a day (BID) | ORAL | 3 refills | Status: DC

## 2017-02-23 MED ORDER — ONDANSETRON HCL 8 MG PO TABS: 8.0000 mg | ORAL_TABLET | Freq: Two times a day (BID) | ORAL | 1 refills | Status: DC | PRN

## 2017-02-23 MED ORDER — LENALIDOMIDE 10 MG PO CAPS: 10.0000 mg | ORAL_CAPSULE | Freq: Every day | ORAL | 4 refills | Status: DC

## 2017-02-23 MED ORDER — DEXAMETHASONE 4 MG PO TABS: ORAL_TABLET | ORAL | 3 refills | Status: DC

## 2017-02-23 NOTE — Progress Notes (Signed)
[facility] Follow-up Visit:  Assessment: Multiple myeloma not having achieved remission [facility]) 71 year old male with chronic renal insufficiency and some generalized symptoms of night sweats and fatigue referred for discovery of a monoclonal gammopathy of unknown significance. Additional evaluation obtained by our service demonstrates monoclonal gammopathy with IgA lambda with significant elevation of IgG levels accompanied by M the ratio of 0.08. Supplementation at this presently mildly anemic, but has no thrombocytopenia. There is progressive renal insufficiency without significant hypercalcemia at this point in time. Skeletal survey is negative for lytic skeletal lesions  The findings of renal insufficiency, anemia, and kappa/lambda ratio of 0.08 are essentially diagnostic of IgA lambda multiple myeloma. His new anemia and progressive renal dysfunction, patient appears to have an active disease. At this time, we will initiate additional assessments with bone marrow biopsy, PET scan, and lab work. Meanwhile, patient will keep appointment with ENT for the biopsy of the white area of the mouth found on the previous examination suspicious for possible leukoplakia or squamous cell carcinoma.  Plan: --PET-CT --IR for BM Bx --RTC 3 weeks: Labs including LDH and beta-2 microglobulin, clinic visit, chemotherapy education, possible initiation of therapy with lenalidomide, bortezomib, low-dose dexamethasone combination of induction therapy  Voice recognition software was used and creation of this note. Despite my best effort at editing the text, some misspelling/errors may have occurred.   Orders Placed This Encounter  Procedures  . NM PET Image Initial (PI) Whole Body    Standing Status:   Future    Standing Expiration Date:   02/23/2018    Order Specific Question:   If indicated for the ordered procedure, I authorize the administration of a radiopharmaceutical per Radiology  protocol    Answer:   Yes    Order Specific Question:   Preferred imaging location?    Answer:   [facility]    Order Specific Question:   Radiology Contrast Protocol - do NOT remove file path    Answer:   \\[email or web address]\[email or web address]    Order Specific Question:   Reason for Exam additional comments    Answer:   Multiple myeloma assesment  . CT BONE MARROW BIOPSY & ASPIRATION    Standing Status:   Future    Standing Expiration Date:   05/26/2018    Order Specific Question:   Reason for Exam (SYMPTOM  OR DIAGNOSIS REQUIRED)    Answer:   Multiple myeloma    Order Specific Question:   Preferred imaging location?    Answer:   [facility]    Order Specific Question:   Radiology Contrast Protocol - do NOT remove file path    Answer:   \\[email or web address]\Radiant\CTProtocols.pdf  . CT Biopsy    Standing Status:   Future    Standing Expiration Date:   02/23/2018    Order Specific Question:   Lab orders requested (DO NOT place separate lab orders, these will be automatically ordered during procedure specimen collection):    Answer:   Cytology - Non Pap    Comments:   Cytogenetics, MM FISH    Order Specific Question:   Lab orders requested (DO NOT place separate lab orders, these will be automatically ordered during procedure specimen collection):    Answer:   Surgical Pathology    Order Specific Question:   Lab orders requested (DO NOT place separate lab orders, these will be automatically ordered during procedure specimen collection):    Answer:   Other    Order Specific Question:  Reason for Exam (SYMPTOM  OR DIAGNOSIS REQUIRED)    Answer:   Multiple myeloma    Order Specific Question:   Preferred imaging location?    Answer:   [facility]    Order Specific Question:   Radiology Contrast Protocol - do NOT remove file path    Answer:   \\[email or web address]\Radiant\CTProtocols.pdf  . CBC with Differential    Standing Status:   Future    Standing  Expiration Date:   02/23/2018  . Comprehensive metabolic panel    Standing Status:   Future    Standing Expiration Date:   02/23/2018  . Lactate dehydrogenase (LDH)    Standing Status:   Future    Standing Expiration Date:   02/23/2018  . Uric acid    Standing Status:   Future    Standing Expiration Date:   02/23/2018  . Beta 2 microglobulin, serum    Standing Status:   Future    Standing Expiration Date:   02/23/2018  . PHYSICIAN COMMUNICATION ORDER    Order aspirin 81-314m OR Coumadin for thromboembolic prophylaxis via "add orders". Target Coumadin INR = 2-3.    Cancer Staging No matching staging information was found for the patient.  All questions were answered.  . The patient knows to call the clinic with any problems, questions or concerns.  This note was electronically signed.    History of Presenting Illness Robert HemphillBurden 71y.o. [place] of [place] significance discovered during evaluation of renal disease. At this time, [name] having some night sweats, but no weight loss. He indicates weakness in bilateral thighs associated with the feet and legs. Complains of constipation, but denies any numbness or tingling in hands and feet. Denies any new musculoskeletal pain. No chest pain, shortness of breath or cough. No nausea, vomiting, abdominal pain, diarrhea, or early satiety.  Patient returns today to review the findings of our workup. [place] any new symptoms since last visit to the clinic.  Oncological/hematological History: --Labs, 10/28/15: WBC 6.0, Hgb 10.3, Plt 245;   --Labs, 10/02/16: WBC 5.1, Hgb 10.0, Plt 227; ANA -- negative --Labs, 11/10/16: tProt 6.3, Alb 3.4, Ca 9.1, Cr 2.56, AP 82; WBC 4.7, Hgb 8.1, Plt 201; Fe 43, FeSat 16%, TIBC 272, Ferritin 476, Vit B12 199, MMA 283; TSH 1.57 --Labs, 12/25/16: tProt 7.0, Alb 4.1, Ca 9.9, Cr 1.47, AP ...; SPEP -- M-spike 0.8g/dL; kappa  40.7, lambda 305.1, KLR 0.13; WBC 3.4, Hgb 13.2, Plt 154; --Labs, 02/09/17: tProt 8.0, Alb 3.9, Ca 10.1, Cr 1.80, AP 97; SPEP -- M-spike 0.7g.dL, IgA lambda; IgA 1220, IgG 666, IgM 52; kappa 33.5, lambda 433.5, KLR 0.08; WBC 6.3, Hgb 10.6, plt 194 --Skeletal Survey, 02/16/17: No evidence of lytic skeletal lesions   No history exists.    Medical History: Past Medical History:  Diagnosis Date  . Anemia   . Arthritis    shoulders,feet   . Cataract    per eye dr -has appt 5-11  . CKD (chronic kidney disease), stage III   . Elevated PSA   . History of ketoacidosis    03-29-2015   . History of sepsis    07-25-2013  non-compliant w/ medication  . Hyperlipidemia   . Hypertension   . Nocturia   . Peripheral neuropathy   . [place]   . Type 2 diabetes mellitus with insulin therapy ([facility]     Surgical History: Past Surgical History:  Procedure Laterality Date  . CIRCUMCISION  1990's  . PROSTATE BIOPSY N/A 12/21/2015   Procedure: BIOPSY TRANSRECTAL ULTRASONIC PROSTATE (TUBP);  Surgeon: Festus Aloe, MD;  Location[facility];  Service: Urology;  Laterality: N/A;    Family History: Family History  Problem Relation Age of Onset  . Cancer Neg Hx   . Colon cancer Neg Hx   . Colon polyps Neg Hx   . Esophageal cancer Neg Hx   . Rectal cancer Neg Hx   . Stomach cancer Neg Hx     Social History: Social History   Social History  . Marital status: Single    Spouse name: N/A  . Number of children: N/A  . Years of education: N/A   Occupational History  . Not on file.   Social History Main Topics  . Smoking status: Current Every Day Smoker    Packs/day: 0.25    Years: 50.00    Types: Cigarettes  . Smokeless tobacco: Never Used     Comment: 1 ppwk-4-5 a day   . Alcohol use 0.6 oz/week    1 Cans of beer per week     Comment: 1 every day-quit couple weeks ago  . Drug use: No  . Sexual activity: Not Currently   Other Topics Concern  . Not on  file   Social History Narrative  . No narrative on file    Allergies: No Known Allergies  Medications:  Current Outpatient Prescriptions  Medication Sig Dispense Refill  . ACCU-CHEK AVIVA PLUS test strip See admin instructions.  12  . amLODipine (NORVASC) 5 MG tablet Take 1 tablet (5 mg total) by mouth daily. 90 tablet 3  . Ascorbic Acid (VITAMIN C) 1000 MG tablet Take 1,000 mg by mouth daily.    [name] aspirin EC 81 MG tablet Take 1 tablet (81 mg total) by mouth daily. 90 tablet 3  . Cholecalciferol (VITAMIN D) 2000 units CAPS Take 2,000 Units by mouth daily.    . ferrous sulfate 325 (65 FE) MG tablet Take 1 tablet (325 mg total) by mouth daily with breakfast. 60 tablet 3  . folic acid (FOLVITE) 1 MG tablet Take 1 tablet (1 mg total) by mouth daily. 60 tablet 0  . gabapentin (NEURONTIN) 300 MG capsule Take 1 capsule (300 mg total) by mouth 4 (four) times daily. Take 1 tab po am and lunch, 2 tabs at night. 120 capsule 2  . insulin aspart protamine - aspart (NOVOLOG 70/30 MIX) (70-30) 100 UNIT/ML FlexPen Inject 0.05 mLs (5 Units total) into the skin 2 (two) times daily with a meal. 6 mL 2  . Multiple Vitamin (MULTIVITAMIN WITH MINERALS) TABS tablet Take 1 tablet by mouth daily. 60 tablet 2  . pravastatin (PRAVACHOL) 20 MG tablet Take 1 tablet (20 mg total) by mouth daily. 90 tablet 3  . senna-docusate (SENOKOT-S) 8.6-50 MG tablet Take 2 tablets by mouth 2 (two) times daily. 60 tablet 2  . thiamine 100 MG tablet Take 1 tablet (100 mg total) by mouth daily. 60 tablet 0  . acyclovir (ZOVIRAX) 400 MG tablet Take 1 tablet (400 mg total) by mouth 2 (two) times daily. 60 tablet 3  . dexamethasone (DECADRON) 4 MG tablet Take 2 and a half tablets (10 mg) on the day before and the day of Velcade chemotherapy. Repeat every 21 days. 20 tablet 3  . lenalidomide (REVLIMID) 10 MG capsule Take 1 capsule (10 mg total) by mouth daily. Take 1 capsule daily on days  1-14 every 21 days. 14 capsule 4  .  lidocaine-prilocaine (EMLA) cream Apply to affected area once 30 g 3  . LORazepam (ATIVAN) 0.5 MG tablet Take 1 tablet (0.5 mg total) by mouth every 6 (six) hours as needed (Nausea or vomiting). 30 tablet 0  . ondansetron (ZOFRAN) 8 MG tablet Take 1 tablet (8 mg total) by mouth 2 (two) times daily as needed (Nausea or vomiting). 30 tablet 1  . prochlorperazine (COMPAZINE) 10 MG tablet Take 1 tablet (10 mg total) by mouth every 6 (six) hours as needed (Nausea or vomiting). 30 tablet 1   Current Facility-Administered Medications  Medication Dose Route Frequency Provider Last Rate Last Dose  . 0.9 %  sodium chloride infusion  500 mL Intravenous Continuous Pyrtle, Lajuan Lines, MD        Review of Systems: Review of Systems  Constitutional: Positive for diaphoresis and fatigue. Negative for unexpected weight change.  Gastrointestinal: Positive for constipation.  Neurological: Positive for extremity weakness.  All other systems reviewed and are negative.    PHYSICAL EXAMINATION Blood pressure 118/71, pulse 87, temperature 98.8 F (37.1 C), temperature source Oral, resp. rate 18, height '5\' 4"'  (1.626 m), weight 157 lb 14.4 oz (71.6 kg), SpO2 100 %.  ECOG PERFORMANCE STATUS: 1 - Symptomatic but completely ambulatory  Physical Exam  Constitutional: He is oriented to person, place, and time and well-developed, well-nourished, and in no distress. No distress.  HENT:  Head: Normocephalic and atraumatic.  Patient still has some of his lower front teeth left. These appear to be in good health without evidence of infection. Dissemination of the oral cavity also reveals presence of a white patch over the posterior left lower gum without ulceration or bleeding. No other areas of abnormalities noted.  Eyes: Pupils are equal, round, and reactive to light. Conjunctivae and EOM are normal. No scleral icterus.  Neck: No thyromegaly present.  Cardiovascular: Normal rate and regular rhythm.   No murmur  heard. Pulmonary/Chest: Effort normal and breath sounds normal. No respiratory distress. He has no wheezes.  Abdominal: Soft. He exhibits no distension and no mass. There is no tenderness.  Musculoskeletal: He exhibits no edema or tenderness.  Lymphadenopathy:    He has no cervical adenopathy.  Neurological: He is alert and oriented to person, place, and time. He has normal reflexes. No cranial nerve deficit.  Skin: Skin is warm and dry. No rash noted. He is not diaphoretic. No erythema.     LABORATORY DATA: I have personally reviewed the data as listed: No visits with results within 1 Week(s) from this visit.  Latest known visit with results is:  Appointment on 02/09/2017  Component Date Value Ref Range Status  . WBC 02/09/2017 6.3  4.0 - 10.3 10e3/uL Final  . NEUT# 02/09/2017 4.7  1.5 - 6.5 10e3/uL Final  . HGB 02/09/2017 10.6* 13.0 - 17.1 g/dL Final  . HCT 02/09/2017 31.4* 38.4 - 49.9 % Final  . Platelets 02/09/2017 194  140 - 400 10e3/uL Final  . MCV 02/09/2017 100.3* 79.3 - 98.0 fL Final  . MCH 02/09/2017 33.9* 27.2 - 33.4 pg Final  . MCHC 02/09/2017 33.8  32.0 - 36.0 g/dL Final  . RBC 02/09/2017 3.13* 4.20 - 5.82 10e6/uL Final  . RDW 02/09/2017 15.0* 11.0 - 14.6 % Final  . lymph# 02/09/2017 0.8* 0.9 - 3.3 10e3/uL Final  . MONO# 02/09/2017 0.3  0.1 - 0.9 10e3/uL Final  . Eosinophils Absolute 02/09/2017 0.6* 0.0 - 0.5 10e3/uL Final  .  Basophils Absolute 02/09/2017 0.0  0.0 - 0.1 10e3/uL Final  . NEUT% 02/09/2017 73.4  39.0 - 75.0 % Final  . LYMPH% 02/09/2017 12.1* 14.0 - 49.0 % Final  . MONO% 02/09/2017 5.4  0.0 - 14.0 % Final  . EOS% 02/09/2017 8.8* 0.0 - 7.0 % Final  . BASO% 02/09/2017 0.3  0.0 - 2.0 % Final  . nRBC 02/09/2017 0  0 - 0 % Final  . Retic % 02/09/2017 1.99* 0.80 - 1.80 % Final  . Retic Ct Abs 02/09/2017 62.29  34.80 - 93.90 10e3/uL Final  . Immature Retic Fract 02/09/2017 3.40  3.00 - 10.60 % Final  . Polychromasia 02/09/2017 Slight  Slight Final  .  Ovalocytes 02/09/2017 Few  Negative Final  . White Cell Comments 02/09/2017 C/W auto diff   Final  . PLT EST 02/09/2017 Adequate  Adequate Final  . Sodium 02/09/2017 137  136 - 145 mEq/L Final  . Potassium 02/09/2017 3.9  3.5 - 5.1 mEq/L Final  . Chloride 02/09/2017 105  98 - 109 mEq/L Final  . CO2 02/09/2017 20* 22 - 29 mEq/L Final  . Glucose 02/09/2017 154* 70 - 140 mg/dl Final   Glucose reference range is for nonfasting patients. Fasting glucose reference range is 70- 100.  [name] BUN 02/09/2017 30.4* 7.0 - 26.0 mg/dL Final  . Creatinine 02/09/2017 1.8* 0.7 - 1.3 mg/dL Final  . Total Bilirubin 02/09/2017 0.37  0.20 - 1.20 mg/dL Final  . Alkaline Phosphatase 02/09/2017 97  40 - 150 U/L Final  . AST 02/09/2017 18  5 - 34 U/L Final  . ALT 02/09/2017 16  0 - 55 U/L Final  . Total Protein 02/09/2017 8.0  6.4 - 8.3 g/dL Final  . Albumin 02/09/2017 3.9  3.5 - 5.0 g/dL Final  . Calcium 02/09/2017 10.1  8.4 - 10.4 mg/dL Final  . Anion Gap 02/09/2017 12* 3 - 11 mEq/L Final  . EGFR 02/09/2017 42* >90 ml/min/1.73 m2 Final   eGFR is calculated using the CKD-EPI Creatinine Equation (2009)  . LDH 02/09/2017 131  125 - 245 U/L Final  . Beta-2 02/09/2017 3.1* 0.6 - 2.4 mg/L Final   Siemens Immulite 2000 Immunochemiluminometric assay (ICMA)  . IgG, Qn, Serum 02/09/2017 666* 700 - 1,600 mg/dL Final  . IgA, Qn, Serum 02/09/2017 1,220* 61 - 437 mg/dL Final   Comment: Results confirmed on dilution.   . IgM, Qn, Serum 02/09/2017 52  20 - 172 mg/dL Final  . Total Protein 02/09/2017 7.4  6.0 - 8.5 g/dL Final  . Albumin SerPl Elph-Mcnc 02/09/2017 4.1  2.9 - 4.4 g/dL Final  . Alpha 1 02/09/2017 0.2  0.0 - 0.4 g/dL Final  . Alpha2 Glob SerPl Elph-Mcnc 02/09/2017 1.0  0.4 - 1.0 g/dL Final  . B-Globulin SerPl Elph-Mcnc 02/09/2017 1.5* 0.7 - 1.3 g/dL Final  . Gamma Glob SerPl Elph-Mcnc 02/09/2017 0.6  0.4 - 1.8 g/dL Final  . M Protein SerPl Elph-Mcnc 02/09/2017 0.7* Not Observed g/dL Final  . Globulin, Total  02/09/2017 3.3  2.2 - 3.9 g/dL Final  . Albumin/Glob SerPl 02/09/2017 1.3  0.7 - 1.7 Final  . IFE 1 02/09/2017 Comment   Final   Comment: Immunofixation shows IgA monoclonal protein with lambda light chain specificity.   . Please Note 02/09/2017 Comment   Final   Comment: Protein electrophoresis scan will follow via computer, mail, or courier delivery.   Harriet Pho Free Light Chain 02/09/2017 33.5* 3.3 - 19.4 mg/L Final  . Ig  Lambda Free Light Chain 02/09/2017 433.5* 5.7 - 26.3 mg/L Final  . Kappa/Lambda FluidC Ratio 02/09/2017 0.08* 0.26 - 1.65 Final  . PROTEIN,TOTAL,URINE 02/12/2017 28.7  Not Estab. mg/dL Final   Total Volume: 1850 mL  . Prot,24hr calculated 02/12/2017 531* 30 - 150 mg/24 hr Final  . ALBUMIN, U 02/12/2017 55.2  % Final  . ALPHA 1 URINE 02/12/2017 3.0  % Final  . ALPHA-2-GLOBULIN, U 02/12/2017 21.9  % Final  . % BETA, Urine 02/12/2017 12.2  % Final  . GAMMA GLOBULIN URINE 02/12/2017 7.7  % Final  . M-SPIKE, % 02/12/2017 12.5* Not Observed % Final   AN ADDITIONAL M-SPIKE WAS OBSERVED AT A CONCENTRATION OF 5.3%.  [name] M-Spike, mg/24 hr 02/12/2017 66* Not Observed mg/24 hr Final  . Immunofixation Result, Urine 02/12/2017 Comment   Final   Comment: Bence Jones Protein positive; lambda type. Immunofixation shows IgA monoclonal protein with lambda light chain specificity.   [name] NOTE: 02/12/2017 Comment   Final   Comment: Protein electrophoresis scan will follow via computer, mail, or courier delivery.   . Free Kappa Lt Chains,Ur 02/12/2017 136.00* 1.35 - 24.19 mg/L Final   **Results verified by repeat testing**  . Free Lambda Lt Chains,Ur 02/12/2017 177.00* 0.24 - 6.66 mg/L Final   **Results verified by repeat testing**  . Kappa/Lambda Ratio,U 02/12/2017 0.77* 2.04 - 10.37 Final       [name], MD

## 2017-02-23 NOTE — Telephone Encounter (Signed)
Gave pt avs and calendar for upcoming appointments.

## 2017-02-23 NOTE — Assessment & Plan Note (Signed)
71 year old male with chronic renal insufficiency and some generalized symptoms of night sweats and fatigue referred for discovery of a monoclonal gammopathy of unknown significance. Additional evaluation obtained by our service demonstrates monoclonal gammopathy with IgA lambda with significant elevation of IgG levels accompanied by M the ratio of 0.08. Supplementation at this presently mildly anemic, but has no thrombocytopenia. There is progressive renal insufficiency without significant hypercalcemia at this point in time. Skeletal survey is negative for lytic skeletal lesions  The findings of renal insufficiency, anemia, and kappa/lambda ratio of 0.08 are essentially diagnostic of IgA lambda multiple myeloma. His new anemia and progressive renal dysfunction, patient appears to have an active disease. At this time, we will initiate additional assessments with bone marrow biopsy, PET scan, and lab work. Meanwhile, patient will keep appointment with ENT for the biopsy of the white area of the mouth found on the previous examination suspicious for possible leukoplakia or squamous cell carcinoma.  Plan: --PET-CT --IR for BM Bx --RTC 3 weeks: Labs including LDH and beta-2 microglobulin, clinic visit, chemotherapy education, possible initiation of therapy with lenalidomide, bortezomib, low-dose dexamethasone combination of induction therapy  Voice recognition software was used and creation of this note. Despite my best effort at editing the text, some misspelling/errors may have occurred.

## 2017-02-23 NOTE — Progress Notes (Signed)
START ON PATHWAY REGIMEN - Multiple Myeloma and Other Plasma Cell Dyscrasias     A cycle is every 21 days:     Bortezomib      Lenalidomide      Dexamethasone   **Always confirm dose/schedule in your pharmacy ordering system**    Patient Characteristics: Newly Diagnosed, Transplant Eligible, Unknown or Awaiting Test Results R-ISS Staging: Unknown Disease Classification: Newly Diagnosed Is Patient Eligible for Transplant? Transplant Eligible Risk Status: Awaiting Test Results Intent of Therapy: Curative Intent, Discussed with Patient

## 2017-02-26 ENCOUNTER — Ambulatory Visit: Payer: Medicare HMO | Admitting: Internal Medicine

## 2017-02-26 DIAGNOSIS — M65351 Trigger finger, right little finger: Secondary | ICD-10-CM | POA: Diagnosis not present

## 2017-02-26 DIAGNOSIS — M65312 Trigger thumb, left thumb: Secondary | ICD-10-CM | POA: Diagnosis not present

## 2017-02-26 DIAGNOSIS — M65341 Trigger finger, right ring finger: Secondary | ICD-10-CM | POA: Diagnosis not present

## 2017-02-26 DIAGNOSIS — M65342 Trigger finger, left ring finger: Secondary | ICD-10-CM | POA: Diagnosis not present

## 2017-02-27 ENCOUNTER — Telehealth: Payer: Self-pay | Admitting: Pharmacist

## 2017-02-27 DIAGNOSIS — C9000 Multiple myeloma not having achieved remission: Secondary | ICD-10-CM

## 2017-02-27 MED ORDER — LENALIDOMIDE 10 MG PO CAPS: 10.0000 mg | ORAL_CAPSULE | Freq: Every day | ORAL | 0 refills | Status: AC

## 2017-02-27 MED FILL — NOVOLOG MIX 70-30 FLEXPEN S: (70-30) 100 | 30 days supply | Qty: 3 | Fill #4

## 2017-02-27 MED FILL — ACCU-CHEK AVIVA PLUS TEST S: 15 days supply | Qty: 50 | Fill #7

## 2017-02-27 NOTE — Telephone Encounter (Signed)
Oral Oncology Pharmacist Encounter  Received new prescription for Revlimid for the induction treatment of multiple myeloma in conjunction with dexamethasone and bortezomib, planned duration 4-6 cycles.  Labs from 02/09/17 assessed, OK for treatment. Noted elevated SCr to 1.8, estimated CrCl 40 ml/min, 6m dose reduction of Revlimid appropriate per manufacturer's recommendations, may increase to 151mif no toxicities.  Current medication list in Epic reviewed, no DDIs with Revlimid identified. Noted aspirin for thromboprophylaxis and acyclovir for HSV reactivation prophylaxis  [place]: [phone] benefits analysis and approval as Revlimid is a limited distribution medication. Noted VRd planned start date: 03/16/17  Noted chemo education class scheduled for 03/06/17.  Oral Oncology Clinic will continue to follow for insurance authorization, copayment issues, and initial counseling.  JeJohny DrillingPharmD, BCPS, BCOP 02/27/2017 2:19 PM Oral Oncology Clinic [phone]

## 2017-02-28 ENCOUNTER — Telehealth: Payer: Self-pay | Admitting: Pharmacy Technician

## 2017-02-28 ENCOUNTER — Encounter: Payer: Self-pay | Admitting: *Deleted

## 2017-02-28 DIAGNOSIS — N2581 Secondary hyperparathyroidism of renal origin: Secondary | ICD-10-CM | POA: Diagnosis not present

## 2017-02-28 DIAGNOSIS — I10 Essential (primary) hypertension: Secondary | ICD-10-CM | POA: Diagnosis not present

## 2017-02-28 DIAGNOSIS — N183 Chronic kidney disease, stage 3 (moderate): Secondary | ICD-10-CM | POA: Diagnosis not present

## 2017-02-28 DIAGNOSIS — Z720 Tobacco use: Secondary | ICD-10-CM | POA: Diagnosis not present

## 2017-02-28 DIAGNOSIS — D631 Anemia in chronic kidney disease: Secondary | ICD-10-CM | POA: Diagnosis not present

## 2017-02-28 NOTE — Telephone Encounter (Signed)
Oral Oncology Patient Advocate Encounter  Received notification from [place] that prior authorization for Revlimid is required.  PA submitted via phone Reference # [id number] Status is pending  [facility] will continue to follow.  Fabio Asa. Melynda Keller, [place] [facility] [phone] 02/28/2017 11:53 AM

## 2017-03-05 ENCOUNTER — Telehealth: Payer: Self-pay

## 2017-03-05 DIAGNOSIS — D1039 Benign neoplasm of other parts of mouth: Secondary | ICD-10-CM | POA: Diagnosis not present

## 2017-03-05 DIAGNOSIS — J343 Hypertrophy of nasal turbinates: Secondary | ICD-10-CM | POA: Diagnosis not present

## 2017-03-05 DIAGNOSIS — C9000 Multiple myeloma not having achieved remission: Secondary | ICD-10-CM | POA: Diagnosis not present

## 2017-03-05 DIAGNOSIS — K08409 Partial loss of teeth, unspecified cause, unspecified class: Secondary | ICD-10-CM | POA: Diagnosis not present

## 2017-03-05 DIAGNOSIS — K1370 Unspecified lesions of oral mucosa: Secondary | ICD-10-CM | POA: Diagnosis not present

## 2017-03-05 DIAGNOSIS — F17210 Nicotine dependence, cigarettes, uncomplicated: Secondary | ICD-10-CM | POA: Diagnosis not present

## 2017-03-05 NOTE — Telephone Encounter (Signed)
Pt called to clarify his appt tomorrow. Done.

## 2017-03-06 ENCOUNTER — Other Ambulatory Visit: Payer: Medicare HMO

## 2017-03-06 ENCOUNTER — Encounter: Payer: Self-pay | Admitting: *Deleted

## 2017-03-08 ENCOUNTER — Other Ambulatory Visit: Payer: Self-pay | Admitting: Radiology

## 2017-03-08 ENCOUNTER — Other Ambulatory Visit: Payer: Self-pay | Admitting: *Deleted

## 2017-03-08 DIAGNOSIS — C9000 Multiple myeloma not having achieved remission: Secondary | ICD-10-CM

## 2017-03-08 MED ORDER — DEXAMETHASONE 4 MG PO TABS: ORAL_TABLET | ORAL | 3 refills | Status: DC

## 2017-03-08 MED ORDER — PROCHLORPERAZINE MALEATE 10 MG PO TABS: 10.0000 mg | ORAL_TABLET | Freq: Four times a day (QID) | ORAL | 1 refills | Status: DC | PRN

## 2017-03-08 MED ORDER — ONDANSETRON HCL 8 MG PO TABS: 8.0000 mg | ORAL_TABLET | Freq: Two times a day (BID) | ORAL | 1 refills | Status: DC | PRN

## 2017-03-08 MED ORDER — LORAZEPAM 0.5 MG PO TABS: 0.5000 mg | ORAL_TABLET | Freq: Four times a day (QID) | ORAL | 0 refills | Status: DC | PRN

## 2017-03-08 MED ORDER — ACYCLOVIR 400 MG PO TABS: 400.0000 mg | ORAL_TABLET | Freq: Two times a day (BID) | ORAL | 3 refills | Status: DC

## 2017-03-08 MED ORDER — LIDOCAINE-PRILOCAINE 2.5-2.5 % EX CREA: TOPICAL_CREAM | CUTANEOUS | 3 refills | Status: DC

## 2017-03-08 MED FILL — FERROUS SULFATE 325 MG TAB: 325 (65 FE) | 30 days supply | Qty: 30 | Fill #3

## 2017-03-08 MED FILL — LIDOCAINE-PRILOCAINE CREAM: 2.5-2.5 | 15 days supply | Qty: 30 | Fill #0

## 2017-03-08 MED FILL — ACYCLOVIR 400 MG TABLET: 400 | 30 days supply | Qty: 60 | Fill #0

## 2017-03-08 MED FILL — ONDANSETRON HCL 8 MG TABLET: 8 | 15 days supply | Qty: 30 | Fill #0

## 2017-03-08 MED FILL — PRAVASTATIN NA 20 MG TAB: 20 | 30 days supply | Qty: 30 | Fill #2

## 2017-03-08 MED FILL — AMLODIPINE BESYLATE 5 MG TA: 5 | 30 days supply | Qty: 30 | Fill #2

## 2017-03-08 MED FILL — PROCHLORPERAZINE 10 MG TAB: 10 | 7 days supply | Qty: 30 | Fill #0

## 2017-03-08 NOTE — Telephone Encounter (Signed)
Oral Chemotherapy Pharmacist Encounter  Received notification from [place] that Revlimid prescription has shipped to patient with delivery on 03/05/17 for copayment $2.52.  Noted VRd planned start date: 03/16/17  Oral Oncology Clinic will continue to follow.  Johny Drilling, PharmD, BCPS, BCOP 03/08/2017  2:16 PM Oral Oncology Clinic [phone]

## 2017-03-08 NOTE — Telephone Encounter (Signed)
Oral Oncology Patient Advocate Encounter  Prior Authorization for Revlimid has been approved.     Effective dates: 03/02/2017 through 03/02/2018  Oral Oncology Clinic will continue to follow.  Fabio Asa. Melynda Keller, [place] [facility] [phone] 03/08/2017 11:26 AM

## 2017-03-09 ENCOUNTER — Encounter (HOSPITAL_COMMUNITY)
Admission: RE | Admit: 2017-03-09 | Discharge: 2017-03-09 | Disposition: A | Discharge status: Home / Self Care | Payer: Medicare HMO | Source: Ambulatory Visit | Attending: Hematology and Oncology | Admitting: Hematology and Oncology

## 2017-03-09 ENCOUNTER — Other Ambulatory Visit: Payer: Self-pay | Admitting: Radiology

## 2017-03-09 DIAGNOSIS — C9000 Multiple myeloma not having achieved remission: Secondary | ICD-10-CM | POA: Diagnosis not present

## 2017-03-09 LAB — GLUCOSE, CAPILLARY: Glucose-Capillary: 117 mg/dL — ABNORMAL HIGH (ref 65–99)

## 2017-03-09 MED ORDER — FLUDEOXYGLUCOSE F - 18 (FDG) INJECTION
7.7000 | Freq: Once | INTRAVENOUS | Status: AC | PRN
  Administered 2017-03-09: 7.7 via INTRAVENOUS

## 2017-03-13 ENCOUNTER — Ambulatory Visit (HOSPITAL_COMMUNITY)
Admission: RE | Admit: 2017-03-13 | Discharge: 2017-03-13 | Disposition: A | Discharge status: Home / Self Care | Payer: Medicare HMO | Source: Ambulatory Visit | Attending: Hematology and Oncology | Admitting: Hematology and Oncology

## 2017-03-13 ENCOUNTER — Encounter (HOSPITAL_COMMUNITY): Payer: Self-pay

## 2017-03-13 DIAGNOSIS — Z8546 Personal history of malignant neoplasm of prostate: Secondary | ICD-10-CM | POA: Insufficient documentation

## 2017-03-13 DIAGNOSIS — C9000 Multiple myeloma not having achieved remission: Secondary | ICD-10-CM | POA: Diagnosis not present

## 2017-03-13 DIAGNOSIS — Z794 Long term (current) use of insulin: Secondary | ICD-10-CM | POA: Diagnosis not present

## 2017-03-13 DIAGNOSIS — I129 Hypertensive chronic kidney disease with stage 1 through stage 4 chronic kidney disease, or unspecified chronic kidney disease: Secondary | ICD-10-CM | POA: Diagnosis not present

## 2017-03-13 DIAGNOSIS — M1990 Unspecified osteoarthritis, unspecified site: Secondary | ICD-10-CM | POA: Diagnosis not present

## 2017-03-13 DIAGNOSIS — D4989 Neoplasm of unspecified behavior of other specified sites: Secondary | ICD-10-CM | POA: Diagnosis not present

## 2017-03-13 DIAGNOSIS — E1122 Type 2 diabetes mellitus with diabetic chronic kidney disease: Secondary | ICD-10-CM | POA: Insufficient documentation

## 2017-03-13 DIAGNOSIS — D649 Anemia, unspecified: Secondary | ICD-10-CM | POA: Insufficient documentation

## 2017-03-13 DIAGNOSIS — D721 Eosinophilia: Secondary | ICD-10-CM | POA: Insufficient documentation

## 2017-03-13 DIAGNOSIS — E785 Hyperlipidemia, unspecified: Secondary | ICD-10-CM | POA: Diagnosis not present

## 2017-03-13 DIAGNOSIS — Z79899 Other long term (current) drug therapy: Secondary | ICD-10-CM | POA: Diagnosis not present

## 2017-03-13 DIAGNOSIS — E1140 Type 2 diabetes mellitus with diabetic neuropathy, unspecified: Secondary | ICD-10-CM | POA: Diagnosis not present

## 2017-03-13 DIAGNOSIS — F17210 Nicotine dependence, cigarettes, uncomplicated: Secondary | ICD-10-CM | POA: Insufficient documentation

## 2017-03-13 DIAGNOSIS — Z7982 Long term (current) use of aspirin: Secondary | ICD-10-CM | POA: Diagnosis not present

## 2017-03-13 DIAGNOSIS — N183 Chronic kidney disease, stage 3 (moderate): Secondary | ICD-10-CM | POA: Diagnosis not present

## 2017-03-13 LAB — CBC WITH DIFFERENTIAL/PLATELET
Basophils Absolute: 0 10*3/uL (ref 0.0–0.1)
Basophils Relative: 0 %
EOS ABS: 1.1 10*3/uL — AB (ref 0.0–0.7)
Eosinophils Relative: 22 %
HEMATOCRIT: 27.2 % — AB (ref 39.0–52.0)
HEMOGLOBIN: 9.3 g/dL — AB (ref 13.0–17.0)
LYMPHS ABS: 0.8 10*3/uL (ref 0.7–4.0)
Lymphocytes Relative: 16 %
MCH: 33.2 pg (ref 26.0–34.0)
MCHC: 34.2 g/dL (ref 30.0–36.0)
MCV: 97.1 fL (ref 78.0–100.0)
MONOS PCT: 5 %
Monocytes Absolute: 0.3 10*3/uL (ref 0.1–1.0)
NEUTROS ABS: 2.8 10*3/uL (ref 1.7–7.7)
Neutrophils Relative %: 57 %
Platelets: 201 10*3/uL (ref 150–400)
RBC: 2.8 MIL/uL — AB (ref 4.22–5.81)
RDW: 14.2 % (ref 11.5–15.5)
WBC: 5 10*3/uL (ref 4.0–10.5)

## 2017-03-13 LAB — PROTIME-INR
INR: 0.98
PROTHROMBIN TIME: 12.9 s (ref 11.4–15.2)

## 2017-03-13 LAB — APTT: APTT: 34 s (ref 24–36)

## 2017-03-13 LAB — GLUCOSE, CAPILLARY: GLUCOSE-CAPILLARY: 154 mg/dL — AB (ref 65–99)

## 2017-03-13 MED ORDER — SODIUM CHLORIDE 0.9 % IV SOLN
INTRAVENOUS | Status: DC
  Administered 2017-03-13: 10:00:00 via INTRAVENOUS

## 2017-03-13 MED ORDER — FENTANYL CITRATE (PF) 100 MCG/2ML IJ SOLN
INTRAMUSCULAR | Status: AC
  Filled 2017-03-13: qty 6

## 2017-03-13 MED ORDER — LIDOCAINE HCL 1 % IJ SOLN
INTRAMUSCULAR | Status: AC | PRN
  Administered 2017-03-13: 10 mL

## 2017-03-13 MED ORDER — NALOXONE HCL 0.4 MG/ML IJ SOLN
INTRAMUSCULAR | Status: AC
  Filled 2017-03-13: qty 1

## 2017-03-13 MED ORDER — FENTANYL CITRATE (PF) 100 MCG/2ML IJ SOLN
INTRAMUSCULAR | Status: AC | PRN
  Administered 2017-03-13: 50 ug via INTRAVENOUS

## 2017-03-13 MED ORDER — MIDAZOLAM HCL 2 MG/2ML IJ SOLN
INTRAMUSCULAR | Status: AC | PRN
  Administered 2017-03-13 (×2): 1 mg via INTRAVENOUS

## 2017-03-13 MED ORDER — FLUMAZENIL 0.5 MG/5ML IV SOLN
INTRAVENOUS | Status: AC
  Filled 2017-03-13: qty 5

## 2017-03-13 MED ORDER — MIDAZOLAM HCL 2 MG/2ML IJ SOLN
INTRAMUSCULAR | Status: AC
  Filled 2017-03-13: qty 6

## 2017-03-13 MED FILL — ACCU-CHEK AVIVA PLUS TEST S: 15 days supply | Qty: 50 | Fill #8

## 2017-03-13 NOTE — Consult Note (Signed)
Chief Complaint: Patient was seen in consultation today for CT-guided bone marrow biopsy  Referring Physician(s): Norfolk G  Supervising Physician: Sandi Mariscal  Patient Status: [facility] - Out-pt  History of Present Illness: Robert Sparks is a 71 y.o. male smoker  with history of prostate cancer,renal insufficiency, anemia, MGUS and abnormal serum protein electrophoresis. He presents today for bone marrow biopsy for further evaluation/rule out myeloma.  Past Medical History:  Diagnosis Date  . Anemia   . Arthritis    shoulders,feet   . Cataract    per eye dr -has appt 5-11  . CKD (chronic kidney disease), stage III   . Elevated PSA   . History of ketoacidosis    03-29-2015   . History of sepsis    07-25-2013  non-compliant w/ medication  . Hyperlipidemia   . Hypertension   . Nocturia   . Peripheral neuropathy   . Prostate cancer ([place])   . Type 2 diabetes mellitus with insulin therapy [facility])     Past Surgical History:  Procedure Laterality Date  . CIRCUMCISION  1990's  . PROSTATE BIOPSY N/A 12/21/2015   Procedure: BIOPSY TRANSRECTAL ULTRASONIC PROSTATE (TUBP);  Surgeon: Festus Aloe, MD;  Location: [facility];  Service: Urology;  Laterality: N/A;    Allergies: Patient has no known allergies.  Medications: Prior to Admission medications   Medication Sig Start Date End Date Taking? Authorizing Provider  ACCU-CHEK AVIVA PLUS test strip See admin instructions. 01/03/17   [provider]  acyclovir (ZOVIRAX) 400 MG tablet Take 1 tablet (400 mg total) by mouth 2 (two) times daily. 03/08/17   [name], MD  amLODipine (NORVASC) 5 MG tablet Take 1 tablet (5 mg total) by mouth daily. 11/21/16   Maren Reamer, MD  Ascorbic Acid (VITAMIN C) 1000 MG tablet Take 1,000 mg by mouth daily.    [provider]  aspirin EC 81 MG tablet Take 1 tablet (81 mg total) by mouth daily. 10/02/16   Maren Reamer, MD  Cholecalciferol  (VITAMIN D) 2000 units CAPS Take 2,000 Units by mouth daily.    [provider]  dexamethasone (DECADRON) 4 MG tablet Take 2 and a half tablets (10 mg) on the day before and the day of Velcade chemotherapy. Repeat every 21 days. 03/08/17   [name], MD  ferrous sulfate 325 (65 FE) MG tablet Take 1 tablet (325 mg total) by mouth daily with breakfast. 11/21/16   Maren Reamer, MD  folic acid (FOLVITE) 1 MG tablet Take 1 tablet (1 mg total) by mouth daily. 11/12/16   Lorella Nimrod, MD  gabapentin (NEURONTIN) 300 MG capsule Take 1 capsule (300 mg total) by mouth 4 (four) times daily. Take 1 tab po am and lunch, 2 tabs at night. 02/15/17   [name], MD  insulin aspart protamine - aspart (NOVOLOG 70/30 MIX) (70-30) 100 UNIT/ML FlexPen Inject 0.05 mLs (5 Units total) into the skin 2 (two) times daily with a meal. 11/21/16   Langeland, Dawn T, MD  lenalidomide (REVLIMID) 10 MG capsule Take 1 capsule (10 mg total) by mouth daily. Take 1 capsule daily on days 1-14 every 21 days. Auth# [id number] 02/27/17 03/20/17  [name], MD  lidocaine-prilocaine (EMLA) cream Apply to affected area once 03/08/17   [name], MD  LORazepam (ATIVAN) 0.5 MG tablet Take 1 tablet (0.5 mg total) by mouth every 6 (six) hours as needed (Nausea or vomiting). 03/08/17  [name], MD  Multiple Vitamin (MULTIVITAMIN WITH MINERALS) TABS tablet Take 1 tablet by mouth daily. 11/12/16   Lorella Nimrod, MD  ondansetron (ZOFRAN) 8 MG tablet Take 1 tablet (8 mg total) by mouth 2 (two) times daily as needed (Nausea or vomiting). 03/08/17   [name], MD  pravastatin (PRAVACHOL) 20 MG tablet Take 1 tablet (20 mg total) by mouth daily. 11/21/16   Maren Reamer, MD  prochlorperazine (COMPAZINE) 10 MG tablet Take 1 tablet (10 mg total) by mouth every 6 (six) hours as needed (Nausea or vomiting). 03/08/17   [name], MD  senna-docusate (SENOKOT-S) 8.6-50 MG tablet Take 2 tablets by mouth 2  (two) times daily. 11/11/16   Lorella Nimrod, MD  thiamine 100 MG tablet Take 1 tablet (100 mg total) by mouth daily. 11/12/16   Lorella Nimrod, MD     Family History  Problem Relation Age of Onset  . Cancer Neg Hx   . Colon cancer Neg Hx   . Colon polyps Neg Hx   . Esophageal cancer Neg Hx   . Rectal cancer Neg Hx   . Stomach cancer Neg Hx     Social History   Social History  . Marital status: Single    Spouse name: N/A  . Number of children: N/A  . Years of education: N/A   Social History Main Topics  . Smoking status: Current Every Day Smoker    Packs/day: 0.25    Years: 50.00    Types: Cigarettes  . Smokeless tobacco: Never Used     Comment: 1 ppwk-4-5 a day   . Alcohol use 0.6 oz/week    1 Cans of beer per week     Comment: 1 every day-quit couple weeks ago  . Drug use: No  . Sexual activity: Not Currently   Other Topics Concern  . None   Social History Narrative  . None      Review of Systems  currently denies fever, headache, chest pain, dyspnea, cough, abdominal/back pain, nausea, vomiting or bleeding.  Vital Signs: BP (!) 146/89 (BP Location: Right Arm)   Pulse 80   Temp 97.8 F (36.6 C) (Oral)   Resp 20   Ht '5\' 4"'  (1.626 m)   SpO2 99%   Physical Exam awake, alert. Chest clear to auscultation bilaterally. Heart with regular rate and rhythm. Abdomen soft, positive bowel sounds, nontender. No lower extremity edema.  Mallampati Score:     Imaging: Nm Pet Image Initial (pi) Whole Body  Result Date: 03/09/2017 CLINICAL DATA:  Initial treatment strategy for multiple myeloma. History prostate cancer. EXAM: NUCLEAR MEDICINE PET WHOLE BODY TECHNIQUE: 7.7 mCi F-18 FDG was injected intravenously. Full-ring PET imaging was performed from the vertex to the feet after the radiotracer. CT data was obtained and used for attenuation correction and anatomic localization. FASTING BLOOD GLUCOSE:  Value: 117 mg/dl COMPARISON:  Multiple exams, including CT scan from  12/29/2015 FINDINGS: HEAD/NECK No hypermetabolic activity in the scalp. No hypermetabolic cervical lymph nodes. CHEST No hypermetabolic mediastinal or hilar nodes. A 5 by 3 mm pleural-based right lower lobe pulmonary nodule on image 44/7 is not appreciably changed compared to 12/29/15 and has no associated abnormal metabolic activity. ABDOMEN/PELVIS No abnormal hypermetabolic activity within the liver, pancreas, adrenal glands, or spleen. No hypermetabolic lymph nodes in the abdomen or pelvis. Calcifications compatible with old granulomatous disease in the spleen. Mild gallbladder wall thickening, nonspecific. Mild chronic appearing bilateral perirenal stranding. Scattered colonic diverticulosis. Aortoiliac  atherosclerotic vascular disease. Mild urinary bladder wall thickening. Fiducials adjacent to the prostate gland. Subtle perirectal stranding and stranding adjacent to the prostate gland, potentially treatment related from prior prostate cancer. SKELETON Healing fractures the left posterior eighth and ninth ribs the, with very low associated metabolic activity an without an obvious underlying myelomatous lesion. No hypermetabolic osseous findings. EXTREMITIES Focal activity along a cutaneous ulceration in the right great toe plantar surface, believed to be inflammatory. IMPRESSION: 1. No abnormal hypermetabolic activity or other specific findings of active myeloma. 2. Several adjacent healing left lateral rib fractures. 3. 4 mm in average size pleural-based right lower lobe pulmonary nodule is unchanged compared to 12/29/2015, and mild technically nonspecific, is likely to be benign. 4. Therapy related findings in the lower pelvis. Fiducials adjacent to the prostate gland. 5. Nonspecific gallbladder wall thickening. 6. Mild bladder wall thickening, low-grade cystitis not excluded. Some of this may be treatment related from prior prostate cancer. 7.  Aortic Atherosclerosis (ICD10-I70.0). Electronically Signed   By:  Van Clines M.D.   On: 03/09/2017 11:59   Dg Bone Survey Met  Result Date: 02/16/2017 CLINICAL DATA:  Metastatic bone survey.  Possible multiple myeloma. EXAM: METASTATIC BONE SURVEY COMPARISON:  None. FINDINGS: No obvious lytic myelomatous lesions are identified in the axial or appendicular skeleton. No acute bony findings or pathologic fracture. The lungs are clear. Mild degenerative joint disease. Moderate to advanced degenerative cervical spondylosis with multilevel disc disease and facet disease. Bilateral cervical ribs are noted with a long rib on the left. Calcified granulomas are noted in the spleen. IMPRESSION: No definite lytic myelomatous lesions in the axial or appendicular skeleton. Electronically Signed   By: Marijo Sanes M.D.   On: 02/16/2017 12:45    Labs:  CBC:  Recent Labs  11/10/16 1145 11/11/16 1130 11/21/16 1416 02/09/17 1334  WBC 4.7 5.4 4.9 6.3  HGB 8.1* 8.6* 8.3* 10.6*  HCT 23.7* 25.7* 25.8* 31.4*  PLT 201 200 272 194    COAGS: No results for input(s): INR, APTT in the last 8760 hours.  BMP:  Recent Labs  11/10/16 1145 11/10/16 1851 11/11/16 1130 11/21/16 1416 02/09/17 1334  NA 132* 134* 135 139 137  K 5.3* 4.8 4.8 4.2 3.9  CL 105 107 107 105  --   CO2 17* 17* 20* 19 20*  GLUCOSE 153* 187* 182* 123* 154*  BUN 50* 47* 33* 20 30.4*  CALCIUM 9.1 8.5* 9.0 9.7 10.1  CREATININE 2.56* 2.10* 1.67* 1.26 1.8*  GFRNONAA 24* 31* 40* 58*  --   GFRAA 28* 36* 47* 67  --     LIVER FUNCTION TESTS:  Recent Labs  11/10/16 1450 02/09/17 1334 02/09/17 1334  BILITOT 0.5 0.37  --   AST 18 18  --   ALT 14* 16  --   ALKPHOS 82 97  --   PROT 6.3* 8.0 7.4  ALBUMIN 3.4* 3.9  --     TUMOR MARKERS: No results for input(s): AFPTM, CEA, CA199, CHROMGRNA in the last 8760 hours.  Assessment and Plan: 71 y.o. male smoker  with history of prostate cancer,renal insufficiency, anemia, MGUS and abnormal serum protein electrophoresis. He presents today for  bone marrow biopsy for further evaluation/rule out myeloma.Risks and benefits discussed with the patient including, but not limited to bleeding, infection, damage to adjacent structures or low yield requiring additional tests. All of the patient's questions were answered, patient is agreeable to proceed. Consent signed and in chart.     Thank  you for this interesting consult.  I greatly enjoyed meeting Robert Sparks and look forward to participating in their care.  A copy of this report was sent to the requesting provider on this date.  Electronically Signed: D. Rowe Robert, PA-C 03/13/2017, 9:13 AM   I spent a total of 25 minutes in face to face in clinical consultation, greater than 50% of which was counseling/coordinating care for CT-guided bone marrow biopsy

## 2017-03-13 NOTE — Discharge Instructions (Signed)
Moderate Conscious Sedation, Adult, Care After These instructions provide you with information about caring for yourself after your procedure. Your health care provider may also give you more specific instructions. Your treatment has been planned according to current medical practices, but problems sometimes occur. Call your health care provider if you have any problems or questions after your procedure. What can I expect after the procedure? After your procedure, it is common:  To feel sleepy for several hours.  To feel clumsy and have poor balance for several hours.  To have poor judgment for several hours.  To vomit if you eat too soon.  Follow these instructions at home: For at least 24 hours after the procedure:   Do not: ? Participate in activities where you could fall or become injured. ? Drive. ? Use heavy machinery. ? Drink alcohol. ? Take sleeping pills or medicines that cause drowsiness. ? Make important decisions or sign legal documents. ? Take care of children on your own.  Rest. Eating and drinking  Follow the diet recommended by your health care provider.  If you vomit: ? Drink water, juice, or soup when you can drink without vomiting. ? Make sure you have little or no nausea before eating solid foods. General instructions  Have a responsible adult stay with you until you are awake and alert.  Take over-the-counter and prescription medicines only as told by your health care provider.  If you smoke, do not smoke without supervision.  Keep all follow-up visits as told by your health care provider. This is important. Contact a health care provider if:  You keep feeling nauseous or you keep vomiting.  You feel light-headed.  You develop a rash.  You have a fever. Get help right away if:  You have trouble breathing. This information is not intended to replace advice given to you by your health care provider. Make sure you discuss any questions you have  with your health care provider. Document Released: 04/16/2013 Document Revised: 11/29/2015 Document Reviewed: 10/16/2015 Elsevier Interactive Patient Education  2018 [place].   Bone Marrow Aspiration and Bone Marrow Biopsy, Adult Bone marrow aspiration and bone marrow biopsy are procedures that are done to diagnose blood disorders. You may also have one of these procedures to help diagnose infections or some types of cancer. Bone marrow is the soft tissue that is inside your bones. Blood cells are produced in bone marrow. For bone marrow aspiration, a sample of tissue in liquid form is removed from inside your bone. For a bone marrow biopsy, a small core of bone marrow tissue is removed. These samples are examined under a microscope or tested in a lab. You may need these procedures if you have an abnormal complete blood count (CBC). The aspiration or biopsy sample is usually taken from the top of your hip bone. Sometimes, an aspiration sample is taken from your chest bone (sternum). Tell a health care provider about:  Any allergies you have.  All medicines you are taking, including vitamins, herbs, eye drops, creams, and over-the-counter medicines.  Any problems you or family members have had with anesthetic medicines.  Any blood or bone disorders you have.  Any surgeries you have had.  Any medical conditions you have.  Whether you are pregnant or you think that you may be pregnant. What are the risks? Generally, this is a safe procedure. However, problems may occur, including:  Infection.  Bleeding.  Persistent pain after the procedure.  Cracking (fracture) of the bone.  Allergic reactions to medicines.  What happens before the procedure? Staying hydrated Follow instructions from your health care provider about hydration, which may include:  Up to 2 hours before the procedure - you may continue to drink clear liquids, such as water, clear fruit juice, black coffee, and  plain tea.  Eating and drinking restrictions Follow instructions from your health care provider about eating and drinking, which may include:  8 hours before the procedure - stop eating heavy meals or foods such as meat, fried foods, or fatty foods.  6 hours before the procedure - stop eating light meals or foods, such as toast or cereal.  6 hours before the procedure - stop drinking milk or drinks that contain milk.  2 hours before the procedure - stop drinking clear liquids.  Medicines  Ask your health care provider about: ? Changing or stopping your regular medicines. This is especially important if you are taking diabetes medicines or blood thinners. ? Taking medicines such as aspirin and ibuprofen. These medicines can thin your blood. Do not take these medicines before your procedure if your health care provider instructs you not to.  You may be given antibiotic medicine to help prevent infection. General instructions  Plan to have someone take you home after the procedure.  If you will be going home right after the procedure, plan to have someone with you for 24 hours.  Ask your health care provider how your surgical site will be marked or identified. What happens during the procedure?  To reduce your risk of infection: ? Your health care team will wash or sanitize their hands. ? Your skin will be washed with soap. ? Hair may be removed from the surgical area.  An IV tube may be inserted into one of your veins.  The injection site will be cleaned with a germ-killing solution (antiseptic).  You will be given one or more of the following: ? A medicine to help you relax (sedative). ? A medicine to numb the area (local anesthetic). ? A medicine to make you fall asleep (general anesthetic).  The bone marrow sample will be removed as follows: ? For an aspiration, a hollow needle will be inserted through your skin and into your bone. Bone marrow fluid will be drawn up into  a syringe. ? For a biopsy, your health care provider will use a hollow needle to remove a core of tissue from your bone marrow.  The needle will be removed.  A bandage (dressing) will be placed over the insertion site and taped in place. The procedure may vary among health care providers and hospitals. What happens after the procedure?  Your blood pressure, heart rate, breathing rate, and blood oxygen level will be monitored until the medicines you were given have worn off.  Your IV tube will be removed, and the insertion site will be checked for bleeding.  Do not drive for 24 hours if you were given a sedative. This information is not intended to replace advice given to you by your health care provider. Make sure you discuss any questions you have with your health care provider. Document Released: 06/29/2004 Document Revised: 01/14/2016 Document Reviewed: 12/08/2015 Elsevier Interactive Patient Education  2018 [place].   Bone Marrow Aspiration and Bone Marrow Biopsy, Adult, Care After This sheet gives you information about how to care for yourself after your procedure. Your health care provider may also give you more specific instructions. If you have problems or questions, contact your health care  provider. What can I expect after the procedure? After the procedure, it is common to have:  Mild pain and tenderness.  Swelling.  Bruising.  Follow these instructions at home:  Take over-the-counter or prescription medicines only as told by your health care provider.  Do not take baths, swim, or use a hot tub until your health care provider approves. Ask if you can take a shower or have a sponge bath.  Follow instructions from your health care provider about how to take care of the puncture site. Make sure you: ? Wash your hands with soap and water before you change your bandage (dressing). If soap and water are not available, use hand sanitizer. ? Change your dressing as told  by your health care provider.  Check your puncture siteevery day for signs of infection. Check for: ? More redness, swelling, or pain. ? More fluid or blood. ? Warmth. ? Pus or a bad smell.  Return to your normal activities as told by your health care provider. Ask your health care provider what activities are safe for you.  Do not drive for 24 hours if you were given a medicine to help you relax (sedative).  Keep all follow-up visits as told by your health care provider. This is important. Contact a health care provider if:  You have more redness, swelling, or pain around the puncture site.  You have more fluid or blood coming from the puncture site.  Your puncture site feels warm to the touch.  You have pus or a bad smell coming from the puncture site.  You have a fever.  Your pain is not controlled with medicine. This information is not intended to replace advice given to you by your health care provider. Make sure you discuss any questions you have with your health care provider. May remove bandaid in 24 hours and shower.  Keep site clean and dry. Document Released: 01/13/2005 Document Revised: 01/14/2016 Document Reviewed: 12/08/2015 Elsevier Interactive Patient Education  2018 [employer].

## 2017-03-13 NOTE — Procedures (Signed)
Pre-procedure Diagnosis: Evaluate for multiple myeloma Post-procedure Diagnosis: Same  Technically successful CT guided bone marrow aspiration and biopsy of left iliac crest.   Complications: None Immediate  EBL: None  Signed: ,  Pager: [phone] 03/13/2017, 12:41 PM    

## 2017-03-14 ENCOUNTER — Ambulatory Visit (HOSPITAL_COMMUNITY): Payer: Medicare HMO

## 2017-03-15 ENCOUNTER — Ambulatory Visit: Payer: Medicare HMO | Admitting: Internal Medicine

## 2017-03-16 ENCOUNTER — Telehealth: Payer: Self-pay | Admitting: Hematology and Oncology

## 2017-03-16 ENCOUNTER — Ambulatory Visit (HOSPITAL_BASED_OUTPATIENT_CLINIC_OR_DEPARTMENT_OTHER): Payer: Medicare HMO

## 2017-03-16 ENCOUNTER — Other Ambulatory Visit (HOSPITAL_BASED_OUTPATIENT_CLINIC_OR_DEPARTMENT_OTHER): Payer: Medicare HMO

## 2017-03-16 ENCOUNTER — Ambulatory Visit (HOSPITAL_BASED_OUTPATIENT_CLINIC_OR_DEPARTMENT_OTHER): Payer: Medicare HMO | Admitting: Hematology and Oncology

## 2017-03-16 ENCOUNTER — Encounter: Payer: Self-pay | Admitting: Hematology and Oncology

## 2017-03-16 ENCOUNTER — Telehealth: Payer: Self-pay | Admitting: Pharmacist

## 2017-03-16 VITALS — BP 140/71 | HR 85 | Temp 98.6°F | Resp 18 | Ht 64.0 in | Wt 157.8 lb

## 2017-03-16 DIAGNOSIS — Z5111 Encounter for antineoplastic chemotherapy: Secondary | ICD-10-CM

## 2017-03-16 DIAGNOSIS — Z5112 Encounter for antineoplastic immunotherapy: Secondary | ICD-10-CM | POA: Diagnosis not present

## 2017-03-16 DIAGNOSIS — C9000 Multiple myeloma not having achieved remission: Secondary | ICD-10-CM

## 2017-03-16 DIAGNOSIS — N183 Chronic kidney disease, stage 3 (moderate): Secondary | ICD-10-CM

## 2017-03-16 LAB — CBC WITH DIFFERENTIAL/PLATELET
BASO%: 0.3 % (ref 0.0–2.0)
Basophils Absolute: 0 10*3/uL (ref 0.0–0.1)
EOS%: 8.6 % — AB (ref 0.0–7.0)
Eosinophils Absolute: 0.6 10*3/uL — ABNORMAL HIGH (ref 0.0–0.5)
HEMATOCRIT: 28 % — AB (ref 38.4–49.9)
HGB: 9.4 g/dL — ABNORMAL LOW (ref 13.0–17.1)
LYMPH#: 0.7 10*3/uL — AB (ref 0.9–3.3)
LYMPH%: 9.9 % — ABNORMAL LOW (ref 14.0–49.0)
MCH: 32.9 pg (ref 27.2–33.4)
MCHC: 33.6 g/dL (ref 32.0–36.0)
MCV: 97.9 fL (ref 79.3–98.0)
MONO#: 0.2 10*3/uL (ref 0.1–0.9)
MONO%: 3.3 % (ref 0.0–14.0)
NEUT%: 77.9 % — AB (ref 39.0–75.0)
NEUTROS ABS: 5.4 10*3/uL (ref 1.5–6.5)
PLATELETS: 195 10*3/uL (ref 140–400)
RBC: 2.86 10*6/uL — ABNORMAL LOW (ref 4.20–5.82)
RDW: 14.1 % (ref 11.0–14.6)
WBC: 6.9 10*3/uL (ref 4.0–10.3)

## 2017-03-16 LAB — COMPREHENSIVE METABOLIC PANEL
ALT: 11 U/L (ref 0–55)
ANION GAP: 12 meq/L — AB (ref 3–11)
AST: 15 U/L (ref 5–34)
Albumin: 3.6 g/dL (ref 3.5–5.0)
Alkaline Phosphatase: 78 U/L (ref 40–150)
BUN: 32.8 mg/dL — ABNORMAL HIGH (ref 7.0–26.0)
CALCIUM: 9.8 mg/dL (ref 8.4–10.4)
CHLORIDE: 103 meq/L (ref 98–109)
CO2: 23 mEq/L (ref 22–29)
CREATININE: 1.7 mg/dL — AB (ref 0.7–1.3)
EGFR: 48 mL/min/{1.73_m2} — AB (ref >90)
Glucose: 175 mg/dl — ABNORMAL HIGH (ref 70–140)
POTASSIUM: 3.7 meq/L (ref 3.5–5.1)
Sodium: 137 mEq/L (ref 136–145)
Total Bilirubin: 0.42 mg/dL (ref 0.20–1.20)
Total Protein: 8 g/dL (ref 6.4–8.3)

## 2017-03-16 LAB — LACTATE DEHYDROGENASE: LDH: 144 U/L (ref 125–245)

## 2017-03-16 LAB — URIC ACID: URIC ACID, SERUM: 6.1 mg/dL (ref 2.6–7.4)

## 2017-03-16 MED ORDER — DEXAMETHASONE 4 MG PO TABS: 20.0000 mg | ORAL_TABLET | ORAL | 4 refills | Status: DC

## 2017-03-16 MED ORDER — SODIUM CHLORIDE 0.9 % IV SOLN
Freq: Once | INTRAVENOUS | Status: AC
  Administered 2017-03-16: 11:00:00 via INTRAVENOUS

## 2017-03-16 MED ORDER — DEXAMETHASONE 4 MG PO TABS: 20.0000 mg | ORAL_TABLET | ORAL | 0 refills | Status: DC

## 2017-03-16 MED ORDER — PROCHLORPERAZINE MALEATE 10 MG PO TABS
10.0000 mg | ORAL_TABLET | Freq: Once | ORAL | Status: AC
  Administered 2017-03-16: 10 mg via ORAL

## 2017-03-16 MED ORDER — PROCHLORPERAZINE MALEATE 10 MG PO TABS
ORAL_TABLET | ORAL | Status: AC
  Filled 2017-03-16: qty 1

## 2017-03-16 MED ORDER — BORTEZOMIB CHEMO SQ INJECTION 3.5 MG (2.5MG/ML)
1.3000 mg/m2 | Freq: Once | INTRAMUSCULAR | Status: AC
  Administered 2017-03-16: 2.25 mg via SUBCUTANEOUS
  Filled 2017-03-16: qty 2.25

## 2017-03-16 MED ORDER — ZOLEDRONIC ACID 4 MG/5ML IV CONC
3.3000 mg | Freq: Once | INTRAVENOUS | Status: AC
  Administered 2017-03-16: 3.3 mg via INTRAVENOUS
  Filled 2017-03-16: qty 4.13

## 2017-03-16 MED ORDER — SULFAMETHOXAZOLE-TRIMETHOPRIM 400-80 MG PO TABS: 1.0000 | ORAL_TABLET | Freq: Every day | ORAL | 0 refills | Status: DC

## 2017-03-16 MED FILL — DEXAMETHASONE 4 MG TABLET: 4 | 21 days supply | Qty: 15 | Fill #0 | Status: TO

## 2017-03-16 MED FILL — SULFAMETHOXAZOLE-TMP SS TAB: 400-80 | 21 days supply | Qty: 21 | Fill #0

## 2017-03-16 NOTE — Telephone Encounter (Signed)
Called patient left message regarding September appointments

## 2017-03-16 NOTE — Patient Instructions (Signed)
Thank you for choosing [facility] to provide your oncology and hematology care.  To afford each patient quality time with our providers, please arrive 30 minutes before your scheduled appointment time.  If you arrive late for your appointment, you may be asked to reschedule.  We strive to give you quality time with our providers, and arriving late affects you and other patients whose appointments are after yours.   If you are a no show for multiple scheduled visits, you may be dismissed from the clinic at the providers discretion.    Again, thank you for choosing [facility], our hope is that these requests will decrease the amount of time that you wait before being seen by our physicians.  ______________________________________________________________________  Should you have questions after your visit to the [facility], please contact our office at [phone] between the hours of 8:30 and 4:30 p.m.    Voicemails left after 4:30p.m will not be returned until the following business day.    For prescription refill requests, please have your pharmacy contact us directly.  Please also try to allow 48 hours for prescription requests.    Please contact the scheduling department for questions regarding scheduling.  For scheduling of procedures such as PET scans, CT scans, MRI, Ultrasound, etc please contact central scheduling at [phone].    Resources For Cancer Patients and Caregivers:   Oncolink.org:  A wonderful resource for patients and healthcare providers for information regarding your disease, ways to tract your treatment, what to expect, etc.     American Cancer Society:  800-227-2345  Can help patients locate various types of support and financial assistance  Cancer Care: 1-800-813-HOPE (4673) Provides financial assistance, online support groups, medication/co-pay assistance.    [place] DSS:  336-641-3447 Where to apply for food  stamps, Medicaid, and utility assistance  Medicare Rights Center: 800-333-4114 Helps people with Medicare understand their rights and benefits, navigate the Medicare system, and secure the quality healthcare they deserve  SCAT: 336-333-6589 [place] [employer]'s shared-ride transportation service for eligible riders who have a disability that prevents them from riding the fixed route bus.    For additional information on assistance programs please contact our social worker:   Grier Hock/Abigail Elmore:  [phone]            

## 2017-03-16 NOTE — Telephone Encounter (Signed)
Oral Chemotherapy Pharmacist Encounter   I spoke with patient in infusion room for overview of new oral chemotherapy medication: Revlimid for the induction treatment of multiple myeloma in conjunction with dexamethasone and bortezomib, planned duration 4-6 cycles.  Counseled patient on administration, dosing, side effects, safe handling, and monitoring. Patient will take Revlimid 80m capsule, 1 capsule by mouth once daily for 14 days on, 7 days off, repeat every 21 days. Prescription for dexamethasone 439mtablets, take 5 tablets (2041monce weekly and a prescription for Bactrim single-stregth tablet once daily [facility] will be processed for shipment to patient for delivery on Tuesday 03/20/17. Revlimid start date 03/16/17  Side effects include but not limited to: rash, GI upset, diarrhea, decreased blood counts, fatigue, and drug fever.    Reviewed with patient importance of keeping a medication schedule and plan for any missed doses.  Medication calendar provided to patient due to complicated dosing scheme.  Patient instructed to take acyclovir 400m30mce daily instead of BID due to kidney function.  Mr. Belko voiced understanding and appreciation.   All questions answered.  Patient knows to call the office with questions or concerns. Oral Oncology Clinic will continue to follow.  Thank you,  JessJohny DrillingarmD, BCPS, BCOP 03/16/2017  4:24 PM Oral Oncology Clinic [phone]

## 2017-03-16 NOTE — Progress Notes (Signed)
OK to treat today with creatinine of 1.7  VO Dr. Lebron Conners via phone/Janice Doyle Askew RN. Patient states he took decadron at home. Pharmacist Evelena Peat) at bedside explaining home medications with the patient.

## 2017-03-16 NOTE — Patient Instructions (Addendum)
[facility] Discharge Instructions for Patients Receiving Chemotherapy  Today you received the following chemotherapy agents Velcade and Zometa  To help prevent nausea and vomiting after your treatment, we encourage you to take your nausea medication as directed    If you develop nausea and vomiting that is not controlled by your nausea medication, call the clinic.   BELOW ARE SYMPTOMS THAT SHOULD BE REPORTED IMMEDIATELY:  *FEVER GREATER THAN 100.5 F  *CHILLS WITH OR WITHOUT FEVER  NAUSEA AND VOMITING THAT IS NOT CONTROLLED WITH YOUR NAUSEA MEDICATION  *UNUSUAL SHORTNESS OF BREATH  *UNUSUAL BRUISING OR BLEEDING  TENDERNESS IN MOUTH AND THROAT WITH OR WITHOUT PRESENCE OF ULCERS  *URINARY PROBLEMS  *BOWEL PROBLEMS  UNUSUAL RASH Items with * indicate a potential emergency and should be followed up as soon as possible.  Feel free to call the clinic you have any questions or concerns. The clinic phone number is [phone].  Please show the CHEMO ALERT CARD at check-in to the Emergency Department and triage nurse.   

## 2017-03-16 NOTE — Telephone Encounter (Signed)
Gave avs and calendar for September and October appointment

## 2017-03-17 LAB — BETA 2 MICROGLOBULIN, SERUM: Beta-2: 3.2 mg/L — ABNORMAL HIGH (ref 0.6–2.4)

## 2017-03-21 NOTE — Assessment & Plan Note (Signed)
71 year old male with new diagnosis of IgA lambda active multiple myeloma, ISS Stage I based on beta-2 microglobulin and albumin levels. Cytogenetics are still pending so complete staging is not at this point in time.  Active disease diagnosed based on presence of anemia, kidney insufficiency, and paraproteinemia with significant predominance of lambda light chains as well as significant elevation of IgA. Bone marrow biopsy confirms presence of atypical monoclonal plasma cell process in the bone marrow comprising at least 7% over the cellularity.  Based on the findings, my recommendation to the patient was to proceed with the treatment combining 3 anti-myeloma drugs as well as a skeletal support with zoledronic acid to reduce the risk of pathological fractures in the future.  The regimen chosen is lenalidomide, bortezomib, and dexamethasone based on the anticipated tolerance by the patient. Patient underwent full education regarding the scheduling, expected benefit, and possible toxicities of the medications. I have reviewed this data with the patient additionally today. He agreed to proceed with the treatment as recommended. Lenalidomide dose will be reduced based on the current creatinine clearance level, dexamethasone dose is reduced due to patient's age.  Plan: --Proceed with RVd with adjustments outlined in the cancer history --Will add prophylactic acyclovir and low-dose Bactrim to reduce the risk of VZV reactivation and PCP infection respectively --He is receiving aspirin and that should service efficient thromboprophylaxis in the context of lower dose of thalidomide --RTC 1wk for toxicity monitoring

## 2017-03-21 NOTE — Progress Notes (Signed)
[facility] Follow-up Visit:  Assessment: No problem-specific Assessment & Plan notes found for this encounter.   No orders of the defined types were placed in this encounter.   Cancer Staging Multiple myeloma not having achieved remission ([place]) Staging form: Plasma Cell Myeloma and Plasma Cell Disorders, AJCC 8th Edition - Clinical stage from 03/21/2017: Beta-2-microglobulin (mg/L): 3.2, Albumin (g/dL): 3.6, ISS: Stage I, High-risk cytogenetics: Unknown, LDH: Normal - Unsigned   All questions were answered.  . The patient knows to call the clinic with any problems, questions or concerns.  This note was electronically signed.    History of Presenting Illness Robert Sparks is a 71 y.o. male followed in the [place] for new diagnosis of IgA lambda active multiple myeloma. At presentation, Robert Sparks reported having some night sweats, but no weight loss. He indicated weakness in bilateral thighs associated with the feet and legs. Complained of constipation, but denies any numbness or tingling in hands and feet. Denied any new musculoskeletal pain. No chest pain, shortness of breath or cough. No nausea, vomiting, abdominal pain, diarrhea, or early satiety.  Additional evaluation was conducted resulting in negative imaging including negative skeletal survey on PET/CT. Bone marrow biopsy returned positive with 7% involvement with atypical monoclonal plasma cells. In conjunction with kidney injury, anemia, dysproteinemia with significant elevation of lambda light chains and IgA, this confirmed a diagnosis of active multiple myeloma warranting intervention. At the previous visit, initiation of systemic triple therapy with lenalidomide, bortezomib, and dexamethasone has been discussed with the patient. Patient underwent a chemotherapy education.  Since the clinic today to initiate the treatment. He denies any new complaints compared to the last visit in the  clinic  Oncological/hematological History: --Labs, 10/28/15: WBC 6.0, Hgb 10.3, Plt 245;   --Labs, 10/02/16: WBC 5.1, Hgb 10.0, Plt 227; ANA -- negative --Labs, 11/10/16: tProt 6.3, Alb 3.4, Ca 9.1, Cr 2.56, AP 82; WBC 4.7, Hgb 8.1, Plt 201; Fe 43, FeSat 16%, TIBC 272, Ferritin 476, Vit B12 199, MMA 283; TSH 1.57 --Labs, 12/25/16: tProt 7.0, Alb 4.1, Ca 9.9, Cr 1.47, AP ...; SPEP -- M-spike 0.8g/dL; kappa 40.7, lambda 305.1, KLR 0.13; WBC 3.4, Hgb 13.2, Plt 154; --Labs, 02/09/17: tProt 8.0, Alb 3.9, Ca 10.1, Cr 1.80, AP 97; SPEP -- M-spike 0.7g.dL, IgA lambda; IgA 1220, IgG 666, IgM 52; kappa 33.5, lambda 433.5, KLR 0.08; WBC 6.3, Hgb 10.6, plt 194    Multiple myeloma not having achieved remission ([facility])   02/16/2017 Imaging    Skeletal Survey: No definite lytic myelomatous lesions in the axial or appendicular skeleton      02/23/2017 Initial Diagnosis    Multiple myeloma not having achieved remission ([place])     03/09/2017 Imaging    PET-CT: No abnormal hypermetabolic activity or other specific findings of active myeloma. Several adjacent healing left lateral rib fractures.      03/13/2017 Pathology Results    Bone Marrow Bx: HYPERCELLULAR BONE MARROW FOR AGE WITH PLASMA CELL NEOPLASM. TRILINEAGE HEMATOPOIESIS. NORMOCYTIC-NORMOCHROMIA ANEMIA. EOSINOPHILIA. The bone marrow shows slight increase in atypical plasma cells representing 7% of all cells associated with small clusters in the clot and biopsy sections. Immunohistochemical stains highlight the plasma cell component in the bone marrow which shows lambda light chain restriction consistent with plasma cell neoplasm. The background shows trilineage hematopoiesis with non-specific myeloid changes. The peripheral blood shows eosinophilia without an associated increase in eosinophils in the bone marrow. The changes may represent a hypersensitivity reaction.  03/16/2017 -  Chemotherapy    Induction chemotherapy: --Lenalidomide 45m PO Qday,  d1-14 --Bortezomib        Medical History: Past Medical History:  Diagnosis Date  . Anemia   . Arthritis    shoulders,feet   . Cataract    per eye dr -has appt 5-11  . CKD (chronic kidney disease), stage III   . Elevated PSA   . History of ketoacidosis    03-29-2015   . History of sepsis    07-25-2013  non-compliant w/ medication  . Hyperlipidemia   . Hypertension   . Nocturia   . Peripheral neuropathy   . [place]   . Type 2 diabetes mellitus with insulin therapy ([facility]     Surgical History: Past Surgical History:  Procedure Laterality Date  . CIRCUMCISION  1990's  . PROSTATE BIOPSY N/A 12/21/2015   Procedure: BIOPSY TRANSRECTAL ULTRASONIC PROSTATE (TUBP);  Surgeon: Robert Aloe MD;  [facility]  Service: Urology;  Laterality: N/A;    Family History: Family History  Problem Relation Age of Onset  . Cancer Neg Hx   . Colon cancer Neg Hx   . Colon polyps Neg Hx   . Esophageal cancer Neg Hx   . Rectal cancer Neg Hx   . Stomach cancer Neg Hx     Social History: Social History   Social History  . Marital status: Single    Spouse name: N/A  . Number of children: N/A  . Years of education: N/A   Occupational History  . Not on file.   Social History Main Topics  . Smoking status: Current Every Day Smoker    Packs/day: 0.25    Years: 50.00    Types: Cigarettes  . Smokeless tobacco: Never Used     Comment: 1 ppwk-4-5 a day   . Alcohol use 0.6 oz/week    1 Cans of beer per week     Comment: 1 every day-quit couple weeks ago  . Drug use: No  . Sexual activity: Not Currently   Other Topics Concern  . Not on file   Social History Narrative  . No narrative on file    Allergies: No Known Allergies  Medications:  Current Outpatient Prescriptions  Medication Sig Dispense Refill  . ACCU-CHEK AVIVA PLUS test strip See admin instructions.  12  . acyclovir (ZOVIRAX) 400 MG tablet Take 1 tablet (400 mg total) by  mouth 2 (two) times daily. 60 tablet 3  . amLODipine (NORVASC) 5 MG tablet Take 1 tablet (5 mg total) by mouth daily. 90 tablet 3  . Ascorbic Acid (VITAMIN C) 1000 MG tablet Take 1,000 mg by mouth daily.    .Marland Kitchenaspirin EC 81 MG tablet Take 1 tablet (81 mg total) by mouth daily. 90 tablet 3  . Cholecalciferol (VITAMIN D) 2000 units CAPS Take 2,000 Units by mouth daily.    . folic acid (FOLVITE) 1 MG tablet Take 1 tablet (1 mg total) by mouth daily. 60 tablet 0  . gabapentin (NEURONTIN) 300 MG capsule Take 1 capsule (300 mg total) by mouth 4 (four) times daily. Take 1 tab po am and lunch, 2 tabs at night. 120 capsule 2  . insulin aspart protamine - aspart (NOVOLOG 70/30 MIX) (70-30) 100 UNIT/ML FlexPen Inject 0.05 mLs (5 Units total) into the skin 2 (two) times daily with a meal. 6 mL 2  . lidocaine-prilocaine (EMLA) cream Apply to affected area once 30 g 3  . LORazepam (  ATIVAN) 0.5 MG tablet Take 1 tablet (0.5 mg total) by mouth every 6 (six) hours as needed (Nausea or vomiting). 30 tablet 0  . Multiple Vitamin (MULTIVITAMIN WITH MINERALS) TABS tablet Take 1 tablet by mouth daily. 60 tablet 2  . ondansetron (ZOFRAN) 8 MG tablet Take 1 tablet (8 mg total) by mouth 2 (two) times daily as needed (Nausea or vomiting). 30 tablet 1  . pravastatin (PRAVACHOL) 20 MG tablet Take 1 tablet (20 mg total) by mouth daily. 90 tablet 3  . prochlorperazine (COMPAZINE) 10 MG tablet Take 1 tablet (10 mg total) by mouth every 6 (six) hours as needed (Nausea or vomiting). 30 tablet 1  . senna-docusate (SENOKOT-S) 8.6-50 MG tablet Take 2 tablets by mouth 2 (two) times daily. 60 tablet 2  . thiamine 100 MG tablet Take 1 tablet (100 mg total) by mouth daily. 60 tablet 0  . dexamethasone (DECADRON) 4 MG tablet Take 5 tablets (20 mg total) by mouth once a week. 15 tablet 0  . sulfamethoxazole-trimethoprim (BACTRIM) 400-80 MG tablet Take 1 tablet by mouth daily. 21 tablet 0   Current Facility-Administered Medications   Medication Dose Route Frequency Provider Last Rate Last Dose  . 0.9 %  sodium chloride infusion  500 mL Intravenous Continuous Pyrtle, Lajuan Lines, MD        Review of Systems: Review of Systems  Constitutional: Positive for diaphoresis and fatigue. Negative for unexpected weight change.  Gastrointestinal: Positive for constipation.  Neurological: Positive for extremity weakness.  All other systems reviewed and are negative.    PHYSICAL EXAMINATION Blood pressure 140/71, pulse 85, temperature 98.6 F (37 C), temperature source Oral, resp. rate 18, height _0  (1.626 m), weight 157 lb 12.8 oz (71.6 kg), SpO2 100 %.  ECOG PERFORMANCE STATUS: 1 - Symptomatic but completely ambulatory  Physical Exam  Constitutional: He is oriented to person, place, and time and well-developed, well-nourished, and in no distress. No distress.  HENT:  Head: Normocephalic and atraumatic.  Patient still has some of his lower front teeth left. These appear to be in good health without evidence of infection. Dissemination of the oral cavity also reveals presence of a white patch over the posterior left lower gum without ulceration or bleeding. No other areas of abnormalities noted.  Eyes: Pupils are equal, round, and reactive to light. Conjunctivae and EOM are normal. No scleral icterus.  Neck: No thyromegaly present.  Cardiovascular: Normal rate and regular rhythm.   No murmur heard. Pulmonary/Chest: Effort normal and breath sounds normal. No respiratory distress. He has no wheezes.  Abdominal: Soft. He exhibits no distension and no mass. There is no tenderness.  Musculoskeletal: He exhibits no edema or tenderness.  Lymphadenopathy:    He has no cervical adenopathy.  Neurological: He is alert and oriented to person, place, and time. He has normal reflexes. No cranial nerve deficit.  Skin: Skin is warm and dry. No rash noted. He is not diaphoretic. No erythema.     LABORATORY DATA: I have personally reviewed  the data as listed: Appointment on 03/16/2017  Component Date Value Ref Range Status  . WBC 03/16/2017 6.9  4.0 - 10.3 10e3/uL Final  . NEUT# 03/16/2017 5.4  1.5 - 6.5 10e3/uL Final  . HGB 03/16/2017 9.4* 13.0 - 17.1 g/dL Final  . HCT 03/16/2017 28.0* 38.4 - 49.9 % Final  . Platelets 03/16/2017 195  140 - 400 10e3/uL Final  . MCV 03/16/2017 97.9  79.3 - 98.0 fL Final  . MCH  03/16/2017 32.9  27.2 - 33.4 pg Final  . MCHC 03/16/2017 33.6  32.0 - 36.0 g/dL Final  . RBC 03/16/2017 2.86* 4.20 - 5.82 10e6/uL Final  . RDW 03/16/2017 14.1  11.0 - 14.6 % Final  . lymph# 03/16/2017 0.7* 0.9 - 3.3 10e3/uL Final  . MONO# 03/16/2017 0.2  0.1 - 0.9 10e3/uL Final  . Eosinophils Absolute 03/16/2017 0.6* 0.0 - 0.5 10e3/uL Final  . Basophils Absolute 03/16/2017 0.0  0.0 - 0.1 10e3/uL Final  . NEUT% 03/16/2017 77.9* 39.0 - 75.0 % Final  . LYMPH% 03/16/2017 9.9* 14.0 - 49.0 % Final  . MONO% 03/16/2017 3.3  0.0 - 14.0 % Final  . EOS% 03/16/2017 8.6* 0.0 - 7.0 % Final  . BASO% 03/16/2017 0.3  0.0 - 2.0 % Final  . Sodium 03/16/2017 137  136 - 145 mEq/L Final  . Potassium 03/16/2017 3.7  3.5 - 5.1 mEq/L Final  . Chloride 03/16/2017 103  98 - 109 mEq/L Final  . CO2 03/16/2017 23  22 - 29 mEq/L Final  . Glucose 03/16/2017 175* 70 - 140 mg/dl Final   Glucose reference range is for nonfasting patients. Fasting glucose reference range is 70- 100.  [name] BUN 03/16/2017 32.8* 7.0 - 26.0 mg/dL Final  . Creatinine 03/16/2017 1.7* 0.7 - 1.3 mg/dL Final  . Total Bilirubin 03/16/2017 0.42  0.20 - 1.20 mg/dL Final  . Alkaline Phosphatase 03/16/2017 78  40 - 150 U/L Final  . AST 03/16/2017 15  5 - 34 U/L Final  . ALT 03/16/2017 11  0 - 55 U/L Final  . Total Protein 03/16/2017 8.0  6.4 - 8.3 g/dL Final  . Albumin 03/16/2017 3.6  3.5 - 5.0 g/dL Final  . Calcium 03/16/2017 9.8  8.4 - 10.4 mg/dL Final  . Anion Gap 03/16/2017 12* 3 - 11 mEq/L Final  . EGFR 03/16/2017 48* >90 ml/min/1.73 m2 Final   eGFR is calculated using  the CKD-EPI Creatinine Equation (2009)  . LDH 03/16/2017 144  125 - 245 U/L Final  . Uric Acid, Serum 03/16/2017 6.1  2.6 - 7.4 mg/dl Final  . Beta-2 03/16/2017 3.2* 0.6 - 2.4 mg/L Final   [employer] Immulite 2000 Immunochemiluminometric assay [facility])  Hospital Outpatient Visit on 03/13/2017  Component Date Value Ref Range Status  . WBC 03/13/2017 5.0  4.0 - 10.5 K/uL Final  . RBC 03/13/2017 2.80* 4.22 - 5.81 MIL/uL Final  . Hemoglobin 03/13/2017 9.3* 13.0 - 17.0 g/dL Final  . HCT 03/13/2017 27.2* 39.0 - 52.0 % Final  . MCV 03/13/2017 97.1  78.0 - 100.0 fL Final  . MCH 03/13/2017 33.2  26.0 - 34.0 pg Final  . MCHC 03/13/2017 34.2  30.0 - 36.0 g/dL Final  . RDW 03/13/2017 14.2  11.5 - 15.5 % Final  . Platelets 03/13/2017 201  150 - 400 K/uL Final  . Neutrophils Relative % 03/13/2017 57  % Final  . Neutro Abs 03/13/2017 2.8  1.7 - 7.7 K/uL Final  . Lymphocytes Relative 03/13/2017 16  % Final  . Lymphs Abs 03/13/2017 0.8  0.7 - 4.0 K/uL Final  . Monocytes Relative 03/13/2017 5  % Final  . Monocytes Absolute 03/13/2017 0.3  0.1 - 1.0 K/uL Final  . Eosinophils Relative 03/13/2017 22  % Final  . Eosinophils Absolute 03/13/2017 1.1* 0.0 - 0.7 K/uL Final  . Basophils Relative 03/13/2017 0  % Final  . Basophils Absolute 03/13/2017 0.0  0.0 - 0.1 K/uL Final  . aPTT 03/13/2017 34  24 - 36 seconds Final  . Prothrombin Time 03/13/2017 12.9  11.4 - 15.2 seconds Final  . INR 03/13/2017 0.98   Final  Hospital Outpatient Visit on 03/13/2017  Component Date Value Ref Range Status  . Glucose-Capillary 03/13/2017 154* 65 - 99 mg/dL Final       [name], MD

## 2017-03-22 MED FILL — NOVOLOG MIX 70-30 FLEXPEN S: (70-30) 100 | 30 days supply | Qty: 3 | Fill #5

## 2017-03-23 ENCOUNTER — Other Ambulatory Visit (HOSPITAL_BASED_OUTPATIENT_CLINIC_OR_DEPARTMENT_OTHER): Payer: Medicare HMO

## 2017-03-23 ENCOUNTER — Telehealth: Payer: Self-pay | Admitting: Hematology and Oncology

## 2017-03-23 ENCOUNTER — Encounter: Payer: Self-pay | Admitting: Hematology and Oncology

## 2017-03-23 ENCOUNTER — Other Ambulatory Visit: Payer: Self-pay

## 2017-03-23 ENCOUNTER — Ambulatory Visit (HOSPITAL_BASED_OUTPATIENT_CLINIC_OR_DEPARTMENT_OTHER): Payer: Medicare HMO

## 2017-03-23 ENCOUNTER — Ambulatory Visit (HOSPITAL_BASED_OUTPATIENT_CLINIC_OR_DEPARTMENT_OTHER): Payer: Medicare HMO | Admitting: Hematology and Oncology

## 2017-03-23 VITALS — BP 121/78 | HR 83 | Temp 98.8°F | Resp 18 | Ht 64.0 in | Wt 156.4 lb

## 2017-03-23 DIAGNOSIS — E876 Hypokalemia: Secondary | ICD-10-CM | POA: Diagnosis not present

## 2017-03-23 DIAGNOSIS — R7989 Other specified abnormal findings of blood chemistry: Secondary | ICD-10-CM | POA: Insufficient documentation

## 2017-03-23 DIAGNOSIS — R21 Rash and other nonspecific skin eruption: Secondary | ICD-10-CM | POA: Diagnosis not present

## 2017-03-23 DIAGNOSIS — C9000 Multiple myeloma not having achieved remission: Secondary | ICD-10-CM | POA: Diagnosis not present

## 2017-03-23 DIAGNOSIS — Z5112 Encounter for antineoplastic immunotherapy: Secondary | ICD-10-CM

## 2017-03-23 DIAGNOSIS — Z5111 Encounter for antineoplastic chemotherapy: Secondary | ICD-10-CM

## 2017-03-23 LAB — CBC WITH DIFFERENTIAL/PLATELET
BASO%: 0.4 % (ref 0.0–2.0)
BASOS ABS: 0 10*3/uL (ref 0.0–0.1)
EOS ABS: 0.8 10*3/uL — AB (ref 0.0–0.5)
EOS%: 17.3 % — AB (ref 0.0–7.0)
HCT: 26.8 % — ABNORMAL LOW (ref 38.4–49.9)
HGB: 9.3 g/dL — ABNORMAL LOW (ref 13.0–17.1)
LYMPH#: 0.3 10*3/uL — AB (ref 0.9–3.3)
LYMPH%: 6.1 % — AB (ref 14.0–49.0)
MCH: 33.7 pg — AB (ref 27.2–33.4)
MCHC: 34.6 g/dL (ref 32.0–36.0)
MCV: 97.3 fL (ref 79.3–98.0)
MONO#: 0.2 10*3/uL (ref 0.1–0.9)
MONO%: 5.1 % (ref 0.0–14.0)
NEUT#: 3.4 10*3/uL (ref 1.5–6.5)
NEUT%: 71.1 % (ref 39.0–75.0)
Platelets: 214 10*3/uL (ref 140–400)
RBC: 2.76 10*6/uL — AB (ref 4.20–5.82)
RDW: 14.9 % — AB (ref 11.0–14.6)
WBC: 4.8 10*3/uL (ref 4.0–10.3)

## 2017-03-23 LAB — COMPREHENSIVE METABOLIC PANEL
ALT: 12 U/L (ref 0–55)
ANION GAP: 11 meq/L (ref 3–11)
AST: 23 U/L (ref 5–34)
Albumin: 3.4 g/dL — ABNORMAL LOW (ref 3.5–5.0)
Alkaline Phosphatase: 73 U/L (ref 40–150)
BUN: 27.4 mg/dL — ABNORMAL HIGH (ref 7.0–26.0)
CALCIUM: 8.9 mg/dL (ref 8.4–10.4)
CHLORIDE: 105 meq/L (ref 98–109)
CO2: 22 meq/L (ref 22–29)
Creatinine: 2.3 mg/dL — ABNORMAL HIGH (ref 0.7–1.3)
EGFR: 32 mL/min/{1.73_m2} — ABNORMAL LOW (ref >90)
Glucose: 156 mg/dl — ABNORMAL HIGH (ref 70–140)
POTASSIUM: 3.2 meq/L — AB (ref 3.5–5.1)
Sodium: 138 mEq/L (ref 136–145)
Total Bilirubin: 0.4 mg/dL (ref 0.20–1.20)
Total Protein: 7.2 g/dL (ref 6.4–8.3)

## 2017-03-23 LAB — MAGNESIUM: MAGNESIUM: 4.1 mg/dL — AB (ref 1.5–2.5)

## 2017-03-23 MED ORDER — PROCHLORPERAZINE MALEATE 10 MG PO TABS
10.0000 mg | ORAL_TABLET | Freq: Once | ORAL | Status: AC
  Administered 2017-03-23: 10 mg via ORAL

## 2017-03-23 MED ORDER — MOMETASONE FUROATE 0.1 % EX CREA: 1.0000 "application " | TOPICAL_CREAM | Freq: Every day | CUTANEOUS | 0 refills | Status: DC

## 2017-03-23 MED ORDER — BORTEZOMIB CHEMO SQ INJECTION 3.5 MG (2.5MG/ML)
1.3000 mg/m2 | Freq: Once | INTRAMUSCULAR | Status: AC
  Administered 2017-03-23: 2.25 mg via SUBCUTANEOUS
  Filled 2017-03-23: qty 2.25

## 2017-03-23 MED ORDER — PROCHLORPERAZINE MALEATE 10 MG PO TABS
ORAL_TABLET | ORAL | Status: AC
  Filled 2017-03-23: qty 1

## 2017-03-23 MED ORDER — LENALIDOMIDE 10 MG PO CAPS: 10.0000 mg | ORAL_CAPSULE | Freq: Every day | ORAL | 0 refills | Status: DC

## 2017-03-23 MED ORDER — POTASSIUM CHLORIDE CRYS ER 10 MEQ PO TBCR: 10.0000 meq | EXTENDED_RELEASE_TABLET | Freq: Every day | ORAL | 0 refills | Status: DC

## 2017-03-23 MED FILL — MOMETASONE FUROATE 0.1% CRM: 0.1 | 45 days supply | Qty: 45 | Fill #0

## 2017-03-23 MED FILL — POTASSIUM CL 10 MEQ TAB SA: 10 | 7 days supply | Qty: 7 | Fill #0

## 2017-03-23 NOTE — Assessment & Plan Note (Signed)
71 year old male with new diagnosis of IgA lambda active multiple myeloma, ISS Stage I based on beta-2 microglobulin and albumin levels. Cytogenetics are still pending so complete staging is not at this point in time. Active disease diagnosed based on presence of anemia, kidney insufficiency, and paraproteinemia with significant predominance of lambda light chains as well as significant elevation of IgA. Bone marrow biopsy confirms presence of atypical monoclonal plasma cell process in the bone marrow comprising at least 7% over the cellularity.  Based on the findings, patient was started on treatment with lenalidomide, bortezomib, and low-dose dexamethasone based on the anticipated tolerance by the patient. Treatment was started last week. Patient has reported good compliance with the oral component of the therapy. Lenalidomide was started at 10 mg daily based on creatinine clearance of 42, dexamethasone dose was reduced to 20 mg weekly based on patient's age.  Since last visit to the clinic, patient developed new rash and his creatinine has gotten higher, but the creatinine clearance does not exceed the safe age to continue current therapy unchanged.  Plan: --Proceed with C1d8 RVd with adjustments outlined in the cancer history --Continue prophylactic acyclovir and low-dose Bactrim to reduce the risk of VZV reactivation and PCP infection respectively --He is receiving aspirin and that should service efficient thromboprophylaxis in the context of lower dose of thalidomide --RTC 1wk for toxicity monitoring

## 2017-03-23 NOTE — Assessment & Plan Note (Signed)
New papular cutaneous rash most consistent with lenalidomide reaction. Based on the area affected, currently patient has grade 1 complication. His pruritic and is bothering the patient. Is possible that the reaction is to the other medications such as Bactrim or acyclovir, but we'll monitor and assess as the condition develops.  Plan:  --Start mometasone cream with daily application

## 2017-03-23 NOTE — Telephone Encounter (Signed)
Gave avs and calendar for september °

## 2017-03-23 NOTE — Progress Notes (Signed)
[facility] Follow-up Visit:  Assessment: Multiple myeloma not having achieved remission [facility]) 71 year old male with new diagnosis of IgA lambda active multiple myeloma, ISS Stage I based on beta-2 microglobulin and albumin levels. Cytogenetics are still pending so complete staging is not at this point in time. Active disease diagnosed based on presence of anemia, kidney insufficiency, and paraproteinemia with significant predominance of lambda light chains as well as significant elevation of IgA. Bone marrow biopsy confirms presence of atypical monoclonal plasma cell process in the bone marrow comprising at least 7% over the cellularity.  Based on the findings, patient was started on treatment with lenalidomide, bortezomib, and low-dose dexamethasone based on the anticipated tolerance by the patient. Treatment was started last week. Patient has reported good compliance with the oral component of the therapy. Lenalidomide was started at 10 mg daily based on creatinine clearance of 42, dexamethasone dose was reduced to 20 mg weekly based on patient's age.  Since last visit to the clinic, patient developed new rash and his creatinine has gotten higher, but the creatinine clearance does not exceed the safe age to continue current therapy unchanged.  Plan: --Proceed with C1d8 RVd with adjustments outlined in the cancer history --Continue prophylactic acyclovir and low-dose Bactrim to reduce the risk of VZV reactivation and PCP infection respectively --He is receiving aspirin and that should service efficient thromboprophylaxis in the context of lower dose of thalidomide --RTC 1wk for toxicity monitoring  Rash, skin New papular cutaneous rash most consistent with lenalidomide reaction. Based on the area affected, currently patient has grade 1 complication. His pruritic and is bothering the patient. Is possible that the reaction is to the other medications such as Bactrim or  acyclovir, but we'll monitor and assess as the condition develops.  Plan:  --Start mometasone cream with daily application  Elevated serum creatinine Baseline renal dysfunction due to chronic kidney disease and for multiple myeloma effect. Creatinine was 1.7 last week, up to 2.3 today. Cannot exclude effects and zoledronic acid itching to the renal dysfunction. Creatinine clearance remains within the same frame as previously and no additional dose modifications are required at this time.  Plan:  --Continue monitoring Cr weekly  Hypokalemia Mild hypokalemia due to altered renal function on systemic anticancer therapy.  Plan: --Potassium chloride 10 mEq daily for 7 days. --Will recheck potassium level at the next visit to the clinic.  Return to the clinic in 1 week for labs, clinical assessment, and continued systemic therapy  Voice recognition software was used and creation of this note. Despite my best effort at editing the text, some misspelling/errors may have occurred.  Orders Placed This Encounter  Procedures  . CBC with Differential    Standing Status:   Future    Standing Expiration Date:   03/23/2018  . Comprehensive metabolic panel    Standing Status:   Future    Standing Expiration Date:   03/23/2018  . Magnesium    Standing Status:   Future    Standing Expiration Date:   03/23/2018  . Phosphorus    Standing Status:   Future    Standing Expiration Date:   03/23/2018    Cancer Staging Multiple myeloma not having achieved remission ([facility]) Staging form: Plasma Cell Myeloma and Plasma Cell Disorders, AJCC 8th Edition - Clinical stage from 03/21/2017: Beta-2-microglobulin (mg/L): 3.2, Albumin (g/dL): 3.6, ISS: Stage I, High-risk cytogenetics: Unknown, LDH: Normal - Unsigned   All questions were answered.  . The patient knows to call  the clinic with any problems, questions or concerns.  This note was electronically signed.    History of Presenting Illness Robert Sparks  [place] is a 71 y.o. male followed in the [place] for new diagnosis of IgA lambda active multiple myeloma. At presentation, [name] reported having some night sweats, but no weight loss. He indicated weakness in bilateral thighs associated with the feet and legs. Complained of constipation, but denies any numbness or tingling in hands and feet. Denied any new musculoskeletal pain. No chest pain, shortness of breath or cough. No nausea, vomiting, abdominal pain, diarrhea, or early satiety. Additional evaluation was conducted resulting in negative imaging including negative skeletal survey on PET/CT. Bone marrow biopsy returned positive with 7% involvement with atypical monoclonal plasma cells. In conjunction with kidney injury, anemia, dysproteinemia with significant elevation of lambda light chains and IgA, this confirmed a diagnosis of active multiple myeloma warranting intervention.   At last visit to the clinic, patient was initiated on systemic induction chemotherapy with lenalidomide, bortezomib, and dexamethasone. Tolerating initial treatment without significant difficulties. Admitted the day for bortezomib adjoining difficulties. Currently presents to clinic for possible day 8 therapy. In the interim, patient notices development of each she erythematous rash on the arms, chest, and thighs. No blistering, no ulceration or bleeding. Patient denies any neuropathy, but does notice that his thigh muscles and buttocks are more fatigable.  Oncological/hematological History: --Labs, 10/28/15: WBC 6.0, Hgb 10.3, Plt 245;   --Labs, 10/02/16: WBC 5.1, Hgb 10.0, Plt 227; ANA -- negative --Labs, 11/10/16: tProt 6.3, Alb 3.4, Ca 9.1, Cr 2.56, AP 82; WBC 4.7, Hgb 8.1, Plt 201; Fe 43, FeSat 16%, TIBC 272, Ferritin 476, Vit B12 199, MMA 283; TSH 1.57 --Labs, 12/25/16: tProt 7.0, Alb 4.1, Ca 9.9, Cr 1.47, AP ...; SPEP -- M-spike 0.8g/dL; kappa 40.7, lambda 305.1, KLR 0.13; WBC 3.4, Hgb 13.2, Plt 154; --Labs,  02/09/17: tProt 8.0, Alb 3.9, Ca 10.1, Cr 1.80, AP 97; SPEP -- M-spike 0.7g.dL, IgA lambda; IgA 1220, IgG 666, IgM 52; kappa 33.5, lambda 433.5, KLR 0.08; WBC 6.3, Hgb 10.6, plt 194    Multiple myeloma not having achieved remission ([facility])   02/16/2017 Imaging    Skeletal Survey: No definite lytic myelomatous lesions in the axial or appendicular skeleton      02/23/2017 Initial Diagnosis    Multiple myeloma not having achieved remission ([place])     03/09/2017 Imaging    PET-CT: No abnormal hypermetabolic activity or other specific findings of active myeloma. Several adjacent healing left lateral rib fractures.      03/13/2017 Pathology Results    Bone Marrow Bx: HYPERCELLULAR BONE MARROW FOR AGE WITH PLASMA CELL NEOPLASM. TRILINEAGE HEMATOPOIESIS. NORMOCYTIC-NORMOCHROMIA ANEMIA. EOSINOPHILIA. The bone marrow shows slight increase in atypical plasma cells representing 7% of all cells associated with small clusters in the clot and biopsy sections. Immunohistochemical stains highlight the plasma cell component in the bone marrow which shows lambda light chain restriction consistent with plasma cell neoplasm. The background shows trilineage hematopoiesis with non-specific myeloid changes. The peripheral blood shows eosinophilia without an associated increase in eosinophils in the bone marrow. The changes may represent a hypersensitivity reaction.      03/16/2017 -  Chemotherapy    Induction chemotherapy: Lenalidomide 88m PO Qday, d1-14 + Bortezomib 1.341mm2 IV d1,4,8,11 + dexamethasone 2056mO QWk Q21d --Cycle #1, 03/30/29/86omplicated by grade 1 rash within 1 week of initiation, as well as a aggressive rising creatinine        Medical  History: Past Medical History:  Diagnosis Date  . Anemia   . Arthritis    shoulders,feet   . Cataract    per eye dr -has appt 5-11  . CKD (chronic kidney disease), stage III   . Elevated PSA   . History of ketoacidosis    03-29-2015   . History of sepsis     07-25-2013  non-compliant w/ medication  . Hyperlipidemia   . Hypertension   . Nocturia   . Peripheral neuropathy   . Prostate cancer ([place])   . Type 2 diabetes mellitus with insulin therapy [facility])     Surgical History: Past Surgical History:  Procedure Laterality Date  . CIRCUMCISION  1990's  . PROSTATE BIOPSY N/A 12/21/2015   Procedure: BIOPSY TRANSRECTAL ULTRASONIC PROSTATE (TUBP);  Surgeon: Festus Aloe, MD;  Location: [facility];  Service: Urology;  Laterality: N/A;    Family History: Family History  Problem Relation Age of Onset  . Cancer Neg Hx   . Colon cancer Neg Hx   . Colon polyps Neg Hx   . Esophageal cancer Neg Hx   . Rectal cancer Neg Hx   . Stomach cancer Neg Hx     Social History: Social History   Social History  . Marital status: Single    Spouse name: N/A  . Number of children: N/A  . Years of education: N/A   Occupational History  . Not on file.   Social History Main Topics  . Smoking status: Current Every Day Smoker    Packs/day: 0.25    Years: 50.00    Types: Cigarettes  . Smokeless tobacco: Never Used     Comment: 1 ppwk-4-5 a day   . Alcohol use 0.6 oz/week    1 Cans of beer per week     Comment: 1 every day-quit couple weeks ago  . Drug use: No  . Sexual activity: Not Currently   Other Topics Concern  . Not on file   Social History Narrative  . No narrative on file    Allergies: No Known Allergies  Medications:  Current Outpatient Prescriptions  Medication Sig Dispense Refill  . ACCU-CHEK AVIVA PLUS test strip See admin instructions.  12  . acyclovir (ZOVIRAX) 400 MG tablet Take 1 tablet (400 mg total) by mouth 2 (two) times daily. 60 tablet 3  . amLODipine (NORVASC) 5 MG tablet Take 1 tablet (5 mg total) by mouth daily. 90 tablet 3  . Ascorbic Acid (VITAMIN C) 1000 MG tablet Take 1,000 mg by mouth daily.    [name] aspirin EC 81 MG tablet Take 1 tablet (81 mg total) by mouth daily. 90 tablet 3  .  Cholecalciferol (VITAMIN D) 2000 units CAPS Take 2,000 Units by mouth daily.    [name] dexamethasone (DECADRON) 4 MG tablet Take 5 tablets (20 mg total) by mouth once a week. 15 tablet 0  . folic acid (FOLVITE) 1 MG tablet Take 1 tablet (1 mg total) by mouth daily. 60 tablet 0  . gabapentin (NEURONTIN) 300 MG capsule Take 1 capsule (300 mg total) by mouth 4 (four) times daily. Take 1 tab po am and lunch, 2 tabs at night. 120 capsule 2  . insulin aspart protamine - aspart (NOVOLOG 70/30 MIX) (70-30) 100 UNIT/ML FlexPen Inject 0.05 mLs (5 Units total) into the skin 2 (two) times daily with a meal. 6 mL 2  . lenalidomide (REVLIMID) 10 MG capsule Take 1 capsule (10 mg total) by mouth daily. Take 1  capsule (24m total) by mouth days 1-14 of every 21 days. Auth# [id number] capsule 0  . lidocaine-prilocaine (EMLA) cream Apply to affected area once 30 g 3  . LORazepam (ATIVAN) 0.5 MG tablet Take 1 tablet (0.5 mg total) by mouth every 6 (six) hours as needed (Nausea or vomiting). 30 tablet 0  . mometasone (ELOCON) 0.1 % cream Apply 1 application topically daily. 45 g 0  . Multiple Vitamin (MULTIVITAMIN WITH MINERALS) TABS tablet Take 1 tablet by mouth daily. 60 tablet 2  . ondansetron (ZOFRAN) 8 MG tablet Take 1 tablet (8 mg total) by mouth 2 (two) times daily as needed (Nausea or vomiting). 30 tablet 1  . potassium chloride SA (K-DUR,KLOR-CON) 10 MEQ tablet Take 1 tablet (10 mEq total) by mouth daily. 7 tablet 0  . pravastatin (PRAVACHOL) 20 MG tablet Take 1 tablet (20 mg total) by mouth daily. 90 tablet 3  . prochlorperazine (COMPAZINE) 10 MG tablet Take 1 tablet (10 mg total) by mouth every 6 (six) hours as needed (Nausea or vomiting). 30 tablet 1  . senna-docusate (SENOKOT-S) 8.6-50 MG tablet Take 2 tablets by mouth 2 (two) times daily. 60 tablet 2  . sulfamethoxazole-trimethoprim (BACTRIM) 400-80 MG tablet Take 1 tablet by mouth daily. 21 tablet 0  . thiamine 100 MG tablet Take 1 tablet (100 mg total) by  mouth daily. 60 tablet 0   Current Facility-Administered Medications  Medication Dose Route Frequency Provider Last Rate Last Dose  . 0.9 %  sodium chloride infusion  500 mL Intravenous Continuous Pyrtle, JLajuan Lines MD       Facility-Administered Medications Ordered in Other Visits  Medication Dose Route Frequency Provider Last Rate Last Dose  . bortezomib SQ (VELCADE) chemo injection 2.25 mg  1.3 mg/m2 (Treatment Plan Recorded) Subcutaneous Once Yer Castello GDarnell Level MD      . prochlorperazine (COMPAZINE) tablet 10 mg  10 mg Oral Once PArdath Sax MD        Review of Systems: Review of Systems  Constitutional: Positive for diaphoresis and fatigue. Negative for unexpected weight change.  HENT:  Negative.   Eyes: Negative.   Respiratory: Negative.   Cardiovascular: Negative.   Gastrointestinal: Positive for constipation.  Endocrine: Negative.   Genitourinary: Negative.    Skin: Positive for itching and rash.  Neurological: Positive for extremity weakness.  Hematological: Negative.   Psychiatric/Behavioral: Negative.      PHYSICAL EXAMINATION Blood pressure 121/78, pulse 83, temperature 98.8 F (37.1 C), temperature source Oral, resp. rate 18, height _0  (1.626 m), weight 156 lb 6.4 oz (70.9 kg), SpO2 100 %.  ECOG PERFORMANCE STATUS: 2 - Symptomatic, <50% confined to bed  Physical Exam  Constitutional: He is oriented to person, place, and time and well-developed, well-nourished, and in no distress. No distress.  HENT:  Head: Normocephalic and atraumatic.  Patient still has some of his lower front teeth left. These appear to be in good health without evidence of infection. Dissemination of the oral cavity also reveals presence of a white patch over the posterior left lower gum without ulceration or bleeding. No other areas of abnormalities noted.  Eyes: Pupils are equal, round, and reactive to light. Conjunctivae and EOM are normal. No scleral icterus.  Neck: No thyromegaly  present.  Cardiovascular: Normal rate and regular rhythm.   No murmur heard. Pulmonary/Chest: Effort normal and breath sounds normal. No respiratory distress. He has no wheezes.  Abdominal: Soft. He exhibits no distension and no mass. There is no tenderness.  Musculoskeletal: He exhibits no edema or tenderness.  Lymphadenopathy:    He has no cervical adenopathy.  Neurological: He is alert and oriented to person, place, and time. He has normal reflexes. No cranial nerve deficit.  Skin: Skin is warm and dry. Rash noted. He is not diaphoretic. No erythema.  Areas of raised, mildly erythematous papular rash with confluence over the bilateral upper arms, chest and upper thighs. No vesiculation or blistering     LABORATORY DATA: I have personally reviewed the data as listed: Appointment on 03/23/2017  Component Date Value Ref Range Status  . WBC 03/23/2017 4.8  4.0 - 10.3 10e3/uL Final  . NEUT# 03/23/2017 3.4  1.5 - 6.5 10e3/uL Final  . HGB 03/23/2017 9.3* 13.0 - 17.1 g/dL Final  . HCT 03/23/2017 26.8* 38.4 - 49.9 % Final  . Platelets 03/23/2017 214  140 - 400 10e3/uL Final  . MCV 03/23/2017 97.3  79.3 - 98.0 fL Final  . MCH 03/23/2017 33.7* 27.2 - 33.4 pg Final  . MCHC 03/23/2017 34.6  32.0 - 36.0 g/dL Final  . RBC 03/23/2017 2.76* 4.20 - 5.82 10e6/uL Final  . RDW 03/23/2017 14.9* 11.0 - 14.6 % Final  . lymph# 03/23/2017 0.3* 0.9 - 3.3 10e3/uL Final  . MONO# 03/23/2017 0.2  0.1 - 0.9 10e3/uL Final  . Eosinophils Absolute 03/23/2017 0.8* 0.0 - 0.5 10e3/uL Final  . Basophils Absolute 03/23/2017 0.0  0.0 - 0.1 10e3/uL Final  . NEUT% 03/23/2017 71.1  39.0 - 75.0 % Final  . LYMPH% 03/23/2017 6.1* 14.0 - 49.0 % Final  . MONO% 03/23/2017 5.1  0.0 - 14.0 % Final  . EOS% 03/23/2017 17.3* 0.0 - 7.0 % Final  . BASO% 03/23/2017 0.4  0.0 - 2.0 % Final  . Sodium 03/23/2017 138  136 - 145 mEq/L Final  . Potassium 03/23/2017 3.2* 3.5 - 5.1 mEq/L Final  . Chloride 03/23/2017 105  98 - 109 mEq/L Final   . CO2 03/23/2017 22  22 - 29 mEq/L Final  . Glucose 03/23/2017 156* 70 - 140 mg/dl Final   Glucose reference range is for nonfasting patients. Fasting glucose reference range is 70- 100.  [name] BUN 03/23/2017 27.4* 7.0 - 26.0 mg/dL Final  . Creatinine 03/23/2017 2.3* 0.7 - 1.3 mg/dL Final  . Total Bilirubin 03/23/2017 0.40  0.20 - 1.20 mg/dL Final  . Alkaline Phosphatase 03/23/2017 73  40 - 150 U/L Final  . AST 03/23/2017 23  5 - 34 U/L Final  . ALT 03/23/2017 12  0 - 55 U/L Final  . Total Protein 03/23/2017 7.2  6.4 - 8.3 g/dL Final  . Albumin 03/23/2017 3.4* 3.5 - 5.0 g/dL Final  . Calcium 03/23/2017 8.9  8.4 - 10.4 mg/dL Final  . Anion Gap 03/23/2017 11  3 - 11 mEq/L Final  . EGFR 03/23/2017 32* >90 ml/min/1.73 m2 Final   eGFR is calculated using the CKD-EPI Creatinine Equation (2009)  . Magnesium 03/23/2017 4.1* 1.5 - 2.5 mg/dl Final       [name], MD

## 2017-03-23 NOTE — Progress Notes (Signed)
Dr. Lebron Conners okay to tx with Ctn 2.3. Infusion room RN aware to proceed.

## 2017-03-23 NOTE — Assessment & Plan Note (Signed)
Baseline renal dysfunction due to chronic kidney disease and for multiple myeloma effect. Creatinine was 1.7 last week, up to 2.3 today. Cannot exclude effects and zoledronic acid itching to the renal dysfunction. Creatinine clearance remains within the same frame as previously and no additional dose modifications are required at this time.  Plan:  --Continue monitoring Cr weekly

## 2017-03-23 NOTE — Assessment & Plan Note (Signed)
Mild hypokalemia due to altered renal function on systemic anticancer therapy.  Plan: --Potassium chloride 10 mEq daily for 7 days. --Will recheck potassium level at the next visit to the clinic.

## 2017-03-23 NOTE — Patient Instructions (Signed)
[facility] Discharge Instructions for Patients Receiving Chemotherapy  Today you received the following chemotherapy agents Velcade. To help prevent nausea and vomiting after your treatment, we encourage you to take your nausea medication as directed.  If you develop nausea and vomiting that is not controlled by your nausea medication, call the clinic.   BELOW ARE SYMPTOMS THAT SHOULD BE REPORTED IMMEDIATELY:  *FEVER GREATER THAN 100.5 F  *CHILLS WITH OR WITHOUT FEVER  NAUSEA AND VOMITING THAT IS NOT CONTROLLED WITH YOUR NAUSEA MEDICATION  *UNUSUAL SHORTNESS OF BREATH  *UNUSUAL BRUISING OR BLEEDING  TENDERNESS IN MOUTH AND THROAT WITH OR WITHOUT PRESENCE OF ULCERS  *URINARY PROBLEMS  *BOWEL PROBLEMS  UNUSUAL RASH Items with * indicate a potential emergency and should be followed up as soon as possible.  Feel free to call the clinic you have any questions or concerns. The clinic phone number is [phone].  Please show the CHEMO ALERT CARD at check-in to the Emergency Department and triage nurse.    

## 2017-03-26 ENCOUNTER — Ambulatory Visit (HOSPITAL_BASED_OUTPATIENT_CLINIC_OR_DEPARTMENT_OTHER): Payer: Medicare HMO

## 2017-03-26 DIAGNOSIS — R21 Rash and other nonspecific skin eruption: Secondary | ICD-10-CM

## 2017-03-26 DIAGNOSIS — C9000 Multiple myeloma not having achieved remission: Secondary | ICD-10-CM

## 2017-03-26 MED FILL — GABAPENTIN 300 MG CAPSULE: 300 | 30 days supply | Qty: 120 | Fill #1

## 2017-03-26 NOTE — Patient Instructions (Addendum)
Please use cream as directed. Return to clinic on Friday, 03/30/2017 for scheduled appointments. Dr. Lebron Conners will see you that day.

## 2017-03-26 NOTE — Progress Notes (Signed)
Patient presented to treatment area with diffuse rash on upper thorax, bilateral upper extremities, and bilateral thighs. States he saw Dr. Lebron Conners for same last week; however, rash has worsened. Was prescribed a "cream" for this rash last week, but was unable to pick it up due to the clinic was closed due to inclement weather. Dr. Lebron Conners came to treatment area this am to evaluate. Advised to hold treatment today and have patient fill script and return on Friday, 9/21 for scheduled appts. Patient aware and verbalizes understanding.

## 2017-03-27 ENCOUNTER — Telehealth: Payer: Self-pay | Admitting: *Deleted

## 2017-03-27 ENCOUNTER — Other Ambulatory Visit: Payer: Self-pay | Admitting: *Deleted

## 2017-03-27 NOTE — Telephone Encounter (Signed)
Received call from [place] regarding refill for revlimid.  Per Dr. Lebron Conners we are holding Revlimid due to new c/o rash.  Dr. Lebron Conners will see pt in clinic on 9/20 and make decision regarding refill at that time.  Message sent to scheduling to add clinic visit prior to infusion.

## 2017-03-30 ENCOUNTER — Encounter: Payer: Self-pay | Admitting: Hematology and Oncology

## 2017-03-30 ENCOUNTER — Ambulatory Visit (HOSPITAL_BASED_OUTPATIENT_CLINIC_OR_DEPARTMENT_OTHER): Payer: Medicare HMO

## 2017-03-30 ENCOUNTER — Telehealth: Payer: Self-pay | Admitting: Hematology and Oncology

## 2017-03-30 ENCOUNTER — Other Ambulatory Visit: Payer: Self-pay | Admitting: Pharmacist

## 2017-03-30 ENCOUNTER — Ambulatory Visit (HOSPITAL_BASED_OUTPATIENT_CLINIC_OR_DEPARTMENT_OTHER): Payer: Medicare HMO | Admitting: Hematology and Oncology

## 2017-03-30 ENCOUNTER — Other Ambulatory Visit (HOSPITAL_BASED_OUTPATIENT_CLINIC_OR_DEPARTMENT_OTHER): Payer: Medicare HMO

## 2017-03-30 VITALS — BP 115/63 | HR 70 | Temp 97.7°F | Resp 18 | Ht 64.0 in | Wt 162.4 lb

## 2017-03-30 DIAGNOSIS — C9000 Multiple myeloma not having achieved remission: Secondary | ICD-10-CM

## 2017-03-30 DIAGNOSIS — Z5112 Encounter for antineoplastic immunotherapy: Secondary | ICD-10-CM

## 2017-03-30 DIAGNOSIS — Z5111 Encounter for antineoplastic chemotherapy: Secondary | ICD-10-CM

## 2017-03-30 LAB — CBC WITH DIFFERENTIAL/PLATELET
BASO%: 0.2 % (ref 0.0–2.0)
Basophils Absolute: 0 10*3/uL (ref 0.0–0.1)
EOS ABS: 1.2 10*3/uL — AB (ref 0.0–0.5)
EOS%: 18.5 % — ABNORMAL HIGH (ref 0.0–7.0)
HCT: 24.7 % — ABNORMAL LOW (ref 38.4–49.9)
HGB: 8.1 g/dL — ABNORMAL LOW (ref 13.0–17.1)
LYMPH%: 6.7 % — ABNORMAL LOW (ref 14.0–49.0)
MCH: 32.3 pg (ref 27.2–33.4)
MCHC: 32.8 g/dL (ref 32.0–36.0)
MCV: 98.4 fL — ABNORMAL HIGH (ref 79.3–98.0)
MONO#: 0.8 10*3/uL (ref 0.1–0.9)
MONO%: 12.6 % (ref 0.0–14.0)
NEUT%: 62 % (ref 39.0–75.0)
NEUTROS ABS: 4 10*3/uL (ref 1.5–6.5)
PLATELETS: 235 10*3/uL (ref 140–400)
RBC: 2.51 10*6/uL — ABNORMAL LOW (ref 4.20–5.82)
RDW: 14.4 % (ref 11.0–14.6)
WBC: 6.4 10*3/uL (ref 4.0–10.3)
lymph#: 0.4 10*3/uL — ABNORMAL LOW (ref 0.9–3.3)

## 2017-03-30 LAB — COMPREHENSIVE METABOLIC PANEL
ALT: 9 U/L (ref 0–55)
ANION GAP: 12 meq/L — AB (ref 3–11)
AST: 10 U/L (ref 5–34)
Albumin: 3.4 g/dL — ABNORMAL LOW (ref 3.5–5.0)
Alkaline Phosphatase: 89 U/L (ref 40–150)
BUN: 35.3 mg/dL — AB (ref 7.0–26.0)
CALCIUM: 9.2 mg/dL (ref 8.4–10.4)
CO2: 21 meq/L — AB (ref 22–29)
Chloride: 107 mEq/L (ref 98–109)
Creatinine: 2.6 mg/dL — ABNORMAL HIGH (ref 0.7–1.3)
EGFR: 28 mL/min/{1.73_m2} — ABNORMAL LOW (ref >90)
Glucose: 108 mg/dl (ref 70–140)
Potassium: 3.3 mEq/L — ABNORMAL LOW (ref 3.5–5.1)
Sodium: 140 mEq/L (ref 136–145)
Total Bilirubin: 0.51 mg/dL (ref 0.20–1.20)
Total Protein: 6.8 g/dL (ref 6.4–8.3)

## 2017-03-30 LAB — MAGNESIUM: MAGNESIUM: 2.8 mg/dL — AB (ref 1.5–2.5)

## 2017-03-30 MED ORDER — BORTEZOMIB CHEMO SQ INJECTION 3.5 MG (2.5MG/ML)
1.3000 mg/m2 | Freq: Once | INTRAMUSCULAR | Status: AC
  Administered 2017-03-30: 2.25 mg via SUBCUTANEOUS
  Filled 2017-03-30: qty 2.25

## 2017-03-30 MED ORDER — DEXAMETHASONE 4 MG PO TABS: 20.0000 mg | ORAL_TABLET | ORAL | 6 refills | Status: DC

## 2017-03-30 MED ORDER — PROCHLORPERAZINE MALEATE 10 MG PO TABS
10.0000 mg | ORAL_TABLET | Freq: Once | ORAL | Status: AC
  Administered 2017-03-30: 10 mg via ORAL

## 2017-03-30 MED ORDER — LENALIDOMIDE 10 MG PO CAPS: 10.0000 mg | ORAL_CAPSULE | Freq: Every day | ORAL | 0 refills | Status: DC

## 2017-03-30 MED ORDER — SULFAMETHOXAZOLE-TRIMETHOPRIM 400-80 MG PO TABS: 1.0000 | ORAL_TABLET | Freq: Every day | ORAL | 1 refills | Status: DC

## 2017-03-30 MED ORDER — MOMETASONE FUROATE 0.1 % EX CREA: 1.0000 "application " | TOPICAL_CREAM | Freq: Every day | CUTANEOUS | 3 refills | Status: DC

## 2017-03-30 MED ORDER — PROCHLORPERAZINE MALEATE 10 MG PO TABS
ORAL_TABLET | ORAL | Status: AC
  Filled 2017-03-30: qty 1

## 2017-03-30 NOTE — Patient Instructions (Signed)
[facility] Discharge Instructions for Patients Receiving Chemotherapy  Today you received the following chemotherapy agents Velcade. To help prevent nausea and vomiting after your treatment, we encourage you to take your nausea medication as directed.  If you develop nausea and vomiting that is not controlled by your nausea medication, call the clinic.   BELOW ARE SYMPTOMS THAT SHOULD BE REPORTED IMMEDIATELY:  *FEVER GREATER THAN 100.5 F  *CHILLS WITH OR WITHOUT FEVER  NAUSEA AND VOMITING THAT IS NOT CONTROLLED WITH YOUR NAUSEA MEDICATION  *UNUSUAL SHORTNESS OF BREATH  *UNUSUAL BRUISING OR BLEEDING  TENDERNESS IN MOUTH AND THROAT WITH OR WITHOUT PRESENCE OF ULCERS  *URINARY PROBLEMS  *BOWEL PROBLEMS  UNUSUAL RASH Items with * indicate a potential emergency and should be followed up as soon as possible.  Feel free to call the clinic you have any questions or concerns. The clinic phone number is [phone].  Please show the CHEMO ALERT CARD at check-in to the Emergency Department and triage nurse.    

## 2017-03-30 NOTE — Telephone Encounter (Signed)
Scheduled appt per 9/21 los - patient to get new schedule printed out in the treatment area.

## 2017-03-31 LAB — PHOSPHORUS: PHOSPHORUS: 3.3 mg/dL (ref 2.5–4.5)

## 2017-04-03 ENCOUNTER — Encounter (HOSPITAL_COMMUNITY): Payer: Self-pay

## 2017-04-04 NOTE — Assessment & Plan Note (Signed)
71 y.o. male with new diagnosis of IgA lambda active multiple myeloma, ISS Stage I based on beta-2 microglobulin and albumin levels. Cytogenetics are still pending so complete staging is not at this point in time. Active disease diagnosed based on presence of anemia, kidney insufficiency, and paraproteinemia with significant predominance of lambda light chains as well as significant elevation of IgA. Bone marrow biopsy confirms presence of atypical monoclonal plasma cell process in the bone marrow comprising at least 7% over the cellularity.  Based on the findings, patient was started on treatment with lenalidomide, bortezomib, and low-dose dexamethasone based on the anticipated tolerance by the patient. Treatment was started last week. Patient has reported good compliance with the oral component of the therapy. Lenalidomide was started at 10 mg daily based on creatinine clearance of 42, dexamethasone dose was reduced to 20 mg weekly based on patient's age.  Since education of therapy, patient developed new rash and his creatinine has gotten higher, but the creatinine clearance does not exceed the safe age to continue current therapy unchanged. Rash is improving on steroid therapy. Creatinine is still rising.  Plan: --Continue prophylactic acyclovir and low-dose Bactrim to reduce the risk of VZV reactivation and PCP infection respectively --He is receiving aspirin and that should service efficient thromboprophylaxis in the context of lower dose of thalidomide --RTC 1wk for lab work and possible initiation of the second cycle of RVD

## 2017-04-04 NOTE — Progress Notes (Signed)
Robert Sparks:  Assessment: Multiple myeloma not having achieved remission ([facility]) 71 y.o. male with new diagnosis of IgA lambda active multiple myeloma, ISS Stage I based on beta-2 microglobulin and albumin levels. Cytogenetics are still pending so complete staging is not at this point in time. Active disease diagnosed based on presence of anemia, kidney insufficiency, and paraproteinemia with significant predominance of lambda light chains as well as significant elevation of IgA. Bone marrow biopsy confirms presence of atypical monoclonal plasma cell process in the bone marrow comprising at least 7% over the cellularity.  Based on the findings, patient was started on treatment with lenalidomide, bortezomib, and low-dose dexamethasone based on the anticipated tolerance by the patient. Treatment was started last week. Patient has reported good compliance with the oral component of the therapy. Lenalidomide was started at 10 mg daily based on creatinine clearance of 42, dexamethasone dose was reduced to 20 mg weekly based on patient's age.  Since education of therapy, patient developed new rash and his creatinine has gotten higher, but the creatinine clearance does not exceed the safe age to continue current therapy unchanged. Rash is improving on steroid therapy. Creatinine is still rising.  Plan: --Continue prophylactic acyclovir and low-dose Bactrim to reduce the risk of VZV reactivation and PCP infection respectively --He is receiving aspirin and that should service efficient thromboprophylaxis in the context of lower dose of thalidomide --RTC 1wk for lab work and possible initiation of the second cycle of RVD  Voice recognition software was used and creation of this note. Despite my best effort at editing the text, some misspelling/errors may have occurred.  Orders Placed This Encounter  Procedures  . CBC with Differential    Standing Status:   Future     Standing Expiration Date:   03/30/2018  . Comprehensive metabolic panel    Standing Status:   Future    Standing Expiration Date:   03/30/2018  . Magnesium    Standing Status:   Future    Standing Expiration Date:   03/30/2018  . SPEP with reflex to IFE    Standing Status:   Future    Standing Expiration Date:   03/30/2018  . Kappa/lambda light chains    Standing Status:   Future    Standing Expiration Date:   03/30/2018  . QIG  (Quant. immunoglobulins  - IgG, IgA, IgM)    Standing Status:   Future    Standing Expiration Date:   03/30/2018    Cancer Staging Multiple myeloma not having achieved remission ([place]) Staging form: Plasma Cell Myeloma and Plasma Cell Disorders, AJCC 8th Edition - Clinical stage from 03/21/2017: RISS Stage I (Beta-2-microglobulin (mg/L): 3.2, Albumin (g/dL): 3.6, ISS: Stage I, High-risk cytogenetics: Absent, LDH: Normal) - Signed by Robert Sax, MD on 04/04/2017 - Clinical: No stage assigned - Unsigned   All questions were answered.  . The patient knows to call the clinic with any problems, questions or concerns.  This note was electronically signed.    History of Presenting Illness Robert Sparks is a 71 y.o. male followed in the [facility] for new diagnosis of IgA lambda active multiple myeloma. At presentation, [name] reported having some night sweats, but no weight loss. He indicated weakness in bilateral thighs associated with the feet and legs. Complained of constipation, but denies any numbness or tingling in hands and feet. Denied any new musculoskeletal pain. No chest pain, shortness of breath or cough. No nausea, vomiting, abdominal  pain, diarrhea, or early satiety. Additional evaluation was conducted resulting in negative imaging including negative skeletal survey on PET/CT. Bone marrow biopsy returned positive with 7% involvement with atypical monoclonal plasma cells. In conjunction with kidney injury, anemia, dysproteinemia with significant  elevation of lambda light chains and IgA, this confirmed a diagnosis of active multiple myeloma warranting intervention.   Patient was initiated on systemic induction chemotherapy with lenalidomide, bortezomib, and dexamethasone. After this first cycle of the therapy, patient has noticed development of pruritic erythematous rash on the arms, chest, and thighs. No blistering, no ulceration or bleeding. Patient denies any neuropathy, but does notice that his thigh muscles and buttocks are more fatigable. Patient was prescribed a steroid cream, but initially did not fill the prescription resulting in progressive rash noted on Monday. At that time, we held his treatments and delayed, until today. After he started using pain, rash improved significantly and pruritus is abating. Denies any new symptoms otherwise.  Oncological/hematological History: --Labs, 10/28/15: WBC 6.0, Hgb 10.3, Plt 245;   --Labs, 10/02/16: WBC 5.1, Hgb 10.0, Plt 227; ANA -- negative --Labs, 11/10/16: tProt 6.3, Alb 3.4, Ca 9.1, Cr 2.56, AP 82; WBC 4.7, Hgb 8.1, Plt 201; Fe 43, FeSat 16%, TIBC 272, Ferritin 476, Vit B12 199, MMA 283; TSH 1.57 --Labs, 12/25/16: tProt 7.0, Alb 4.1, Ca 9.9, Cr 1.47, AP ...; SPEP -- M-spike 0.8g/dL; kappa 40.7, lambda 305.1, KLR 0.13; WBC 3.4, Hgb 13.2, Plt 154; --Labs, 02/09/17: tProt 8.0, Alb 3.9, Ca 10.1, Cr 1.80, AP 97; SPEP -- M-spike 0.7g.dL, IgA lambda; IgA 1220, IgG 666, IgM 52; kappa 33.5, lambda 433.5, KLR 0.08; WBC 6.3, Hgb 10.6, plt 194    Multiple myeloma not having achieved remission ([facility])   02/16/2017 Imaging    Skeletal Survey: No definite lytic myelomatous lesions in the axial or appendicular skeleton      02/23/2017 Initial Diagnosis    Multiple myeloma not having achieved remission ([place])     03/09/2017 Imaging    PET-CT: No abnormal hypermetabolic activity or other specific findings of active myeloma. Several adjacent healing left lateral rib fractures.      03/13/2017 Pathology  Results    Bone Marrow Bx: HYPERCELLULAR BONE MARROW FOR AGE WITH PLASMA CELL NEOPLASM. TRILINEAGE HEMATOPOIESIS. NORMOCYTIC-NORMOCHROMIA ANEMIA. EOSINOPHILIA. The bone marrow shows slight increase in atypical plasma cells representing 7% of all cells associated with small clusters in the clot and biopsy sections. Immunohistochemical stains highlight the plasma cell component in the bone marrow which shows lambda light chain restriction consistent with plasma cell neoplasm. The background shows trilineage hematopoiesis with non-specific myeloid changes. The peripheral blood shows eosinophilia without an associated increase in eosinophils in the bone marrow. The changes may represent a hypersensitivity reaction.      03/16/2017 -  Chemotherapy    Induction chemotherapy: Lenalidomide 36m PO Qday, d1-14 + Bortezomib 1.354mm2 IV d1,4,8,11 + dexamethasone 2048mO QWk Q21d --Cycle #1, 09/62/22/97omplicated by grade 2 rash within 1 week of initiation, as well as a aggressive rising creatinine; d11 held due to worsening rash        Medical History: Past Medical History:  Diagnosis Date  . Anemia   . Arthritis    shoulders,feet   . Cataract    per eye dr -has appt 5-11  . CKD (chronic kidney disease), stage III   . Elevated PSA   . History of ketoacidosis    03-29-2015   . History of sepsis    07-25-2013  non-compliant w/ medication  .  Hyperlipidemia   . Hypertension   . Nocturia   . Peripheral neuropathy   . Prostate cancer ([place])   . Type 2 diabetes mellitus with insulin therapy [facility])     Surgical History: Past Surgical History:  Procedure Laterality Date  . CIRCUMCISION  1990's  . PROSTATE BIOPSY N/A 12/21/2015   Procedure: BIOPSY TRANSRECTAL ULTRASONIC PROSTATE (TUBP);  Surgeon: Festus Aloe, MD;  Location[facility];  Service: Urology;  Laterality: N/A;    Family History: Family History  Problem Relation Age of Onset  . Cancer Neg Hx   . Colon cancer Neg  Hx   . Colon polyps Neg Hx   . Esophageal cancer Neg Hx   . Rectal cancer Neg Hx   . Stomach cancer Neg Hx     Social History: Social History   Social History  . Marital status: Single    Spouse name: N/A  . Number of children: N/A  . Years of education: N/A   Occupational History  . Not on file.   Social History Main Topics  . Smoking status: Current Every Day Smoker    Packs/day: 0.25    Years: 50.00    Types: Cigarettes  . Smokeless tobacco: Never Used     Comment: 1 ppwk-4-5 a day   . Alcohol use 0.6 oz/week    1 Cans of beer per week     Comment: 1 every day-quit couple weeks ago  . Drug use: No  . Sexual activity: Not Currently   Other Topics Concern  . Not on file   Social History Narrative  . No narrative on file    Allergies: No Known Allergies  Medications:  Current Outpatient Prescriptions  Medication Sig Dispense Refill  . acyclovir (ZOVIRAX) 400 MG tablet Take 1 tablet (400 mg total) by mouth 2 (two) times daily. 60 tablet 3  . amLODipine (NORVASC) 5 MG tablet Take 1 tablet (5 mg total) by mouth daily. 90 tablet 3  . Ascorbic Acid (VITAMIN C) 1000 MG tablet Take 1,000 mg by mouth daily.    [name] aspirin EC 81 MG tablet Take 1 tablet (81 mg total) by mouth daily. 90 tablet 3  . Cholecalciferol (VITAMIN D) 2000 units CAPS Take 2,000 Units by mouth daily.    . folic acid (FOLVITE) 1 MG tablet Take 1 tablet (1 mg total) by mouth daily. 60 tablet 0  . gabapentin (NEURONTIN) 300 MG capsule Take 1 capsule (300 mg total) by mouth 4 (four) times daily. Take 1 tab po am and lunch, 2 tabs at night. 120 capsule 2  . insulin aspart protamine - aspart (NOVOLOG 70/30 MIX) (70-30) 100 UNIT/ML FlexPen Inject 0.05 mLs (5 Units total) into the skin 2 (two) times daily with a meal. 6 mL 2  . lidocaine-prilocaine (EMLA) cream Apply to affected area once 30 g 3  . LORazepam (ATIVAN) 0.5 MG tablet Take 1 tablet (0.5 mg total) by mouth every 6 (six) hours as needed (Nausea or  vomiting). 30 tablet 0  . mometasone (ELOCON) 0.1 % cream Apply 1 application topically daily. 45 g 3  . Multiple Vitamin (MULTIVITAMIN WITH MINERALS) TABS tablet Take 1 tablet by mouth daily. 60 tablet 2  . ondansetron (ZOFRAN) 8 MG tablet Take 1 tablet (8 mg total) by mouth 2 (two) times daily as needed (Nausea or vomiting). 30 tablet 1  . potassium chloride SA (K-DUR,KLOR-CON) 10 MEQ tablet Take 1 tablet (10 mEq total) by mouth daily. 7 tablet 0  .  pravastatin (PRAVACHOL) 20 MG tablet Take 1 tablet (20 mg total) by mouth daily. 90 tablet 3  . prochlorperazine (COMPAZINE) 10 MG tablet Take 1 tablet (10 mg total) by mouth every 6 (six) hours as needed (Nausea or vomiting). 30 tablet 1  . senna-docusate (SENOKOT-S) 8.6-50 MG tablet Take 2 tablets by mouth 2 (two) times daily. 60 tablet 2  . thiamine 100 MG tablet Take 1 tablet (100 mg total) by mouth daily. 60 tablet 0  . ACCU-CHEK AVIVA PLUS test strip See admin instructions.  12  . dexamethasone (DECADRON) 4 MG tablet Take 5 tablets (20 mg total) by mouth once a week. 15 tablet 6  . lenalidomide (REVLIMID) 10 MG capsule Take 1 capsule (10 mg total) by mouth daily. Take 1 capsule (59m total) by mouth days 1-14 of every 21 days. Auth# [id number] capsule 0  . sulfamethoxazole-trimethoprim (BACTRIM) 400-80 MG tablet Take 1 tablet by mouth daily. 21 tablet 1   Current Facility-Administered Medications  Medication Dose Route Frequency Provider Last Rate Last Dose  . 0.9 %  sodium chloride infusion  500 mL Intravenous Continuous Pyrtle, JLajuan Lines MD        Review of Systems: Review of Systems  Constitutional: Positive for diaphoresis and fatigue. Negative for unexpected weight change.  HENT:  Negative.   Eyes: Negative.   Respiratory: Negative.   Cardiovascular: Negative.   Gastrointestinal: Positive for constipation.  Endocrine: Negative.   Genitourinary: Negative.    Skin: Positive for itching and rash.  Neurological: Positive for extremity  weakness.  Hematological: Negative.   Psychiatric/Behavioral: Negative.      PHYSICAL EXAMINATION Blood pressure 115/63, pulse 70, temperature 97.7 F (36.5 C), temperature source Oral, resp. rate 18, height '5\' 4"'  (1.626 m), weight 162 lb 6.4 oz (73.7 kg), SpO2 100 %.  ECOG PERFORMANCE STATUS: 2 - Symptomatic, <50% confined to bed  Physical Exam  Constitutional: He is oriented to person, place, and time and well-developed, well-nourished, and in no distress. No distress.  HENT:  Head: Normocephalic and atraumatic.  Patient still has some of his lower front teeth left. These appear to be in good health without evidence of infection. Dissemination of the oral cavity also reveals presence of a white patch over the posterior left lower gum without ulceration or bleeding. No other areas of abnormalities noted.  Eyes: Pupils are equal, round, and reactive to light. Conjunctivae and EOM are normal. No scleral icterus.  Neck: No thyromegaly present.  Cardiovascular: Normal rate and regular rhythm.   No murmur heard. Pulmonary/Chest: Effort normal and breath sounds normal. No respiratory distress. He has no wheezes.  Abdominal: Soft. He exhibits no distension and no mass. There is no tenderness.  Musculoskeletal: He exhibits no edema or tenderness.  Lymphadenopathy:    He has no cervical adenopathy.  Neurological: He is alert and oriented to person, place, and time. He has normal reflexes. No cranial nerve deficit.  Skin: Skin is warm and dry. Rash noted. He is not diaphoretic. No erythema.  Areas of raised, mildly erythematous papular rash with confluence over the bilateral upper arms, chest and upper thighs. No vesiculation or blistering     LABORATORY DATA: I have personally reviewed the data as listed: Appointment on 03/30/2017  Component Date Value Ref Range Status  . WBC 03/30/2017 6.4  4.0 - 10.3 10e3/uL Final  . NEUT# 03/30/2017 4.0  1.5 - 6.5 10e3/uL Final  . HGB 03/30/2017 8.1*  13.0 - 17.1 g/dL Final  . HCT  03/30/2017 24.7* 38.4 - 49.9 % Final  . Platelets 03/30/2017 235  140 - 400 10e3/uL Final  . MCV 03/30/2017 98.4* 79.3 - 98.0 fL Final  . MCH 03/30/2017 32.3  27.2 - 33.4 pg Final  . MCHC 03/30/2017 32.8  32.0 - 36.0 g/dL Final  . RBC 03/30/2017 2.51* 4.20 - 5.82 10e6/uL Final  . RDW 03/30/2017 14.4  11.0 - 14.6 % Final  . lymph# 03/30/2017 0.4* 0.9 - 3.3 10e3/uL Final  . MONO# 03/30/2017 0.8  0.1 - 0.9 10e3/uL Final  . Eosinophils Absolute 03/30/2017 1.2* 0.0 - 0.5 10e3/uL Final  . Basophils Absolute 03/30/2017 0.0  0.0 - 0.1 10e3/uL Final  . NEUT% 03/30/2017 62.0  39.0 - 75.0 % Final  . LYMPH% 03/30/2017 6.7* 14.0 - 49.0 % Final  . MONO% 03/30/2017 12.6  0.0 - 14.0 % Final  . EOS% 03/30/2017 18.5* 0.0 - 7.0 % Final  . BASO% 03/30/2017 0.2  0.0 - 2.0 % Final  . Sodium 03/30/2017 140  136 - 145 mEq/L Final  . Potassium 03/30/2017 3.3* 3.5 - 5.1 mEq/L Final  . Chloride 03/30/2017 107  98 - 109 mEq/L Final  . CO2 03/30/2017 21* 22 - 29 mEq/L Final  . Glucose 03/30/2017 108  70 - 140 mg/dl Final   Glucose reference range is for nonfasting patients. Fasting glucose reference range is 70- 100.  [name] BUN 03/30/2017 35.3* 7.0 - 26.0 mg/dL Final  . Creatinine 03/30/2017 2.6* 0.7 - 1.3 mg/dL Final  . Total Bilirubin 03/30/2017 0.51  0.20 - 1.20 mg/dL Final  . Alkaline Phosphatase 03/30/2017 89  40 - 150 U/L Final  . AST 03/30/2017 10  5 - 34 U/L Final  . ALT 03/30/2017 9  0 - 55 U/L Final  . Total Protein 03/30/2017 6.8  6.4 - 8.3 g/dL Final  . Albumin 03/30/2017 3.4* 3.5 - 5.0 g/dL Final  . Calcium 03/30/2017 9.2  8.4 - 10.4 mg/dL Final  . Anion Gap 03/30/2017 12* 3 - 11 mEq/L Final  . EGFR 03/30/2017 28* >90 ml/min/1.73 m2 Final   eGFR is calculated using the CKD-EPI Creatinine Equation (2009)  . Magnesium 03/30/2017 2.8* 1.5 - 2.5 mg/dl Final  . Phosphorus, Ser 03/30/2017 3.3  2.5 - 4.5 mg/dL Final       Robert Sax, MD

## 2017-04-05 ENCOUNTER — Other Ambulatory Visit (HOSPITAL_BASED_OUTPATIENT_CLINIC_OR_DEPARTMENT_OTHER): Payer: Medicare HMO

## 2017-04-05 ENCOUNTER — Ambulatory Visit (HOSPITAL_BASED_OUTPATIENT_CLINIC_OR_DEPARTMENT_OTHER): Payer: Medicare HMO | Admitting: Hematology and Oncology

## 2017-04-05 ENCOUNTER — Telehealth: Payer: Self-pay | Admitting: Hematology and Oncology

## 2017-04-05 VITALS — BP 108/55 | HR 78 | Temp 99.1°F | Resp 18 | Ht 64.0 in | Wt 165.6 lb

## 2017-04-05 DIAGNOSIS — C9000 Multiple myeloma not having achieved remission: Secondary | ICD-10-CM | POA: Diagnosis not present

## 2017-04-05 DIAGNOSIS — Z5111 Encounter for antineoplastic chemotherapy: Secondary | ICD-10-CM | POA: Diagnosis not present

## 2017-04-05 LAB — COMPREHENSIVE METABOLIC PANEL
ALBUMIN: 3.5 g/dL (ref 3.5–5.0)
ALK PHOS: 75 U/L (ref 40–150)
ALT: 7 U/L (ref 0–55)
AST: 12 U/L (ref 5–34)
Anion Gap: 10 mEq/L (ref 3–11)
BILIRUBIN TOTAL: 0.44 mg/dL (ref 0.20–1.20)
BUN: 27.2 mg/dL — ABNORMAL HIGH (ref 7.0–26.0)
CO2: 21 mEq/L — ABNORMAL LOW (ref 22–29)
Calcium: 8.9 mg/dL (ref 8.4–10.4)
Chloride: 106 mEq/L (ref 98–109)
Creatinine: 2.2 mg/dL — ABNORMAL HIGH (ref 0.7–1.3)
EGFR: 34 mL/min/{1.73_m2} — AB (ref >90)
GLUCOSE: 179 mg/dL — AB (ref 70–140)
POTASSIUM: 3.2 meq/L — AB (ref 3.5–5.1)
Sodium: 138 mEq/L (ref 136–145)
TOTAL PROTEIN: 7 g/dL (ref 6.4–8.3)

## 2017-04-05 LAB — CBC WITH DIFFERENTIAL/PLATELET
BASO%: 0.1 % (ref 0.0–2.0)
BASOS ABS: 0 10*3/uL (ref 0.0–0.1)
EOS%: 3.1 % (ref 0.0–7.0)
Eosinophils Absolute: 0.2 10*3/uL (ref 0.0–0.5)
HEMATOCRIT: 22.4 % — AB (ref 38.4–49.9)
HEMOGLOBIN: 7.4 g/dL — AB (ref 13.0–17.1)
LYMPH%: 16.8 % (ref 14.0–49.0)
MCH: 32.2 pg (ref 27.2–33.4)
MCHC: 33 g/dL (ref 32.0–36.0)
MCV: 97.5 fL (ref 79.3–98.0)
MONO#: 0.6 10*3/uL (ref 0.1–0.9)
MONO%: 11.3 % (ref 0.0–14.0)
NEUT%: 68.7 % (ref 39.0–75.0)
NEUTROS ABS: 3.5 10*3/uL (ref 1.5–6.5)
PLATELETS: 280 10*3/uL (ref 140–400)
RBC: 2.3 10*6/uL — ABNORMAL LOW (ref 4.20–5.82)
RDW: 15.8 % — AB (ref 11.0–14.6)
WBC: 5 10*3/uL (ref 4.0–10.3)
lymph#: 0.8 10*3/uL — ABNORMAL LOW (ref 0.9–3.3)

## 2017-04-05 LAB — MAGNESIUM: Magnesium: 2.1 mg/dl (ref 1.5–2.5)

## 2017-04-05 NOTE — Progress Notes (Signed)
No treatment today per Dr. Lebron Conners. Pt Hgb 7.4. MD wants pt to take a week to recover. Infusion RN notified of change.

## 2017-04-05 NOTE — Telephone Encounter (Signed)
Gave patient AVS and calendar of upcoming October appointments °

## 2017-04-06 ENCOUNTER — Ambulatory Visit: Payer: Medicare HMO

## 2017-04-06 LAB — KAPPA/LAMBDA LIGHT CHAINS
Ig Kappa Free Light Chain: 59.7 mg/L — ABNORMAL HIGH (ref 3.3–19.4)
Ig Lambda Free Light Chain: 42.7 mg/L — ABNORMAL HIGH (ref 5.7–26.3)
KAPPA/LAMBDA FLC RATIO: 1.4 (ref 0.26–1.65)

## 2017-04-06 LAB — IGG, IGA, IGM
IgA, Qn, Serum: 433 mg/dL (ref 61–437)
IgG, Qn, Serum: 683 mg/dL — ABNORMAL LOW (ref 700–1600)
IgM, Qn, Serum: 52 mg/dL (ref 20–172)

## 2017-04-06 LAB — CHROMOSOME ANALYSIS, BONE MARROW

## 2017-04-06 LAB — TISSUE HYBRIDIZATION (BONE MARROW)-NCBH

## 2017-04-09 ENCOUNTER — Ambulatory Visit: Payer: Medicare HMO

## 2017-04-09 ENCOUNTER — Other Ambulatory Visit: Payer: Self-pay | Admitting: Hematology and Oncology

## 2017-04-09 DIAGNOSIS — C9000 Multiple myeloma not having achieved remission: Secondary | ICD-10-CM

## 2017-04-09 MED FILL — ACYCLOVIR 400 MG TABLET: 400 | 30 days supply | Qty: 60 | Fill #1

## 2017-04-09 MED FILL — PRAVASTATIN NA 20 MG TAB: 20 | 30 days supply | Qty: 30 | Fill #3

## 2017-04-09 MED FILL — AMLODIPINE BESYLATE 5 MG TA: 5 | 30 days supply | Qty: 30 | Fill #3

## 2017-04-09 MED FILL — FERROUS SULFATE 325 MG TAB: 325 (65 FE) | 30 days supply | Qty: 30 | Fill #4

## 2017-04-09 MED FILL — ACCU-CHEK AVIVA PLUS TEST S: 15 days supply | Qty: 50 | Fill #9

## 2017-04-09 MED FILL — DEXAMETHASONE 4 MG TABLET: 4 | 21 days supply | Qty: 15 | Fill #0

## 2017-04-09 MED FILL — LIDOCAINE-PRILOCAINE CREAM: 2.5-2.5 | 15 days supply | Qty: 30 | Fill #1

## 2017-04-09 MED FILL — POTASSIUM CL 10 MEQ TAB SA: 10 | 7 days supply | Qty: 7 | Fill #0

## 2017-04-10 LAB — PROTEIN ELECTROPHORESIS, SERUM, WITH REFLEX
A/G RATIO SPE: 1.1 (ref 0.7–1.7)
ALBUMIN: 3.3 g/dL (ref 2.9–4.4)
ALPHA 1: 0.3 g/dL (ref 0.0–0.4)
Alpha 2: 0.9 g/dL (ref 0.4–1.0)
Beta: 1.1 g/dL (ref 0.7–1.3)
GAMMA GLOBULIN: 0.7 g/dL (ref 0.4–1.8)
GLOBULIN, TOTAL: 3.1 g/dL (ref 2.2–3.9)
Interpretation(See Below): 0
M-Spike, %: 0.4 g/dL — ABNORMAL HIGH
TOTAL PROTEIN: 6.4 g/dL (ref 6.0–8.5)

## 2017-04-11 ENCOUNTER — Other Ambulatory Visit: Payer: Self-pay

## 2017-04-11 MED ORDER — INSULIN ASPART PROT & ASPART (70-30 MIX) 100 UNIT/ML PEN: 5.0000 [IU] | PEN_INJECTOR | Freq: Two times a day (BID) | SUBCUTANEOUS | 2 refills | Status: DC

## 2017-04-12 ENCOUNTER — Ambulatory Visit: Payer: Medicare HMO | Attending: Internal Medicine | Admitting: Internal Medicine

## 2017-04-12 ENCOUNTER — Encounter: Payer: Self-pay | Admitting: Internal Medicine

## 2017-04-12 VITALS — BP 136/76 | HR 78 | Temp 98.7°F | Resp 16 | Wt 161.6 lb

## 2017-04-12 DIAGNOSIS — H269 Unspecified cataract: Secondary | ICD-10-CM | POA: Diagnosis not present

## 2017-04-12 DIAGNOSIS — L84 Corns and callosities: Secondary | ICD-10-CM | POA: Insufficient documentation

## 2017-04-12 DIAGNOSIS — N183 Chronic kidney disease, stage 3 unspecified: Secondary | ICD-10-CM

## 2017-04-12 DIAGNOSIS — F17210 Nicotine dependence, cigarettes, uncomplicated: Secondary | ICD-10-CM | POA: Insufficient documentation

## 2017-04-12 DIAGNOSIS — E1142 Type 2 diabetes mellitus with diabetic polyneuropathy: Secondary | ICD-10-CM | POA: Insufficient documentation

## 2017-04-12 DIAGNOSIS — E785 Hyperlipidemia, unspecified: Secondary | ICD-10-CM | POA: Diagnosis not present

## 2017-04-12 DIAGNOSIS — Z8546 Personal history of malignant neoplasm of prostate: Secondary | ICD-10-CM | POA: Insufficient documentation

## 2017-04-12 DIAGNOSIS — E1136 Type 2 diabetes mellitus with diabetic cataract: Secondary | ICD-10-CM | POA: Diagnosis not present

## 2017-04-12 DIAGNOSIS — Z794 Long term (current) use of insulin: Secondary | ICD-10-CM | POA: Diagnosis not present

## 2017-04-12 DIAGNOSIS — D6481 Anemia due to antineoplastic chemotherapy: Secondary | ICD-10-CM | POA: Insufficient documentation

## 2017-04-12 DIAGNOSIS — I129 Hypertensive chronic kidney disease with stage 1 through stage 4 chronic kidney disease, or unspecified chronic kidney disease: Secondary | ICD-10-CM | POA: Diagnosis not present

## 2017-04-12 DIAGNOSIS — T451X5A Adverse effect of antineoplastic and immunosuppressive drugs, initial encounter: Secondary | ICD-10-CM | POA: Insufficient documentation

## 2017-04-12 DIAGNOSIS — Z79899 Other long term (current) drug therapy: Secondary | ICD-10-CM | POA: Diagnosis not present

## 2017-04-12 DIAGNOSIS — Z7982 Long term (current) use of aspirin: Secondary | ICD-10-CM | POA: Diagnosis not present

## 2017-04-12 DIAGNOSIS — D649 Anemia, unspecified: Secondary | ICD-10-CM | POA: Diagnosis not present

## 2017-04-12 DIAGNOSIS — F17200 Nicotine dependence, unspecified, uncomplicated: Secondary | ICD-10-CM

## 2017-04-12 DIAGNOSIS — I10 Essential (primary) hypertension: Secondary | ICD-10-CM | POA: Diagnosis not present

## 2017-04-12 DIAGNOSIS — E875 Hyperkalemia: Secondary | ICD-10-CM

## 2017-04-12 DIAGNOSIS — E1122 Type 2 diabetes mellitus with diabetic chronic kidney disease: Secondary | ICD-10-CM | POA: Insufficient documentation

## 2017-04-12 DIAGNOSIS — C9000 Multiple myeloma not having achieved remission: Secondary | ICD-10-CM | POA: Diagnosis not present

## 2017-04-12 DIAGNOSIS — R42 Dizziness and giddiness: Secondary | ICD-10-CM | POA: Diagnosis not present

## 2017-04-12 LAB — POCT GLYCOSYLATED HEMOGLOBIN (HGB A1C): Hemoglobin A1C: 7.3

## 2017-04-12 LAB — GLUCOSE, POCT (MANUAL RESULT ENTRY): POC GLUCOSE: 115 mg/dL — AB (ref 70–99)

## 2017-04-12 NOTE — Assessment & Plan Note (Signed)
71 y.o. male with diagnosis of IgA lambda active multiple myeloma, ISS Stage I. Active disease diagnosed based on presence of anemia, kidney insufficiency, and paraproteinemia with significant predominance of lambda light chains as well as significant elevation of IgA. Bone marrow biopsy confirms presence of atypical monoclonal plasma cell process in the bone marrow comprising at least 7% of the cellularity.  Based on the findings, patient was started on treatment with lenalidomide, bortezomib, and low-dose dexamethasone based on the anticipated tolerance by the patient. Lenalidomide was started at 10 mg daily based on creatinine clearance of 42, dexamethasone dose was reduced to 20 mg weekly based on patient's age. Since education of therapy, patient developed new rash and his creatinine has gotten higher, but the creatinine clearance does not exceed the safe age to continue current therapy unchanged. Rash is improving on steroid therapy. Creatinine is now stable. Lab work today demonstrates biochemical response to therapy with rapidly decreasing values of IgA and lambda light chains. Patient does have severe anemia now, likely as a result of the chemotherapy.  Plan: --Continue prophylactic acyclovir and low-dose Bactrim to reduce the risk of VZV reactivation and PCP infection respectively --He is receiving aspirin and that should service efficient thromboprophylaxis in the context of lower dose of thalidomide --Delayed initiation of the second cycle of the systemic therapy to allow for anemia recovery. --RTC 1wk for lab work and possible RVD: At this time, we will attempt and treat with the same doses of medications, but if anemia persists, dose reduction will be necessary

## 2017-04-12 NOTE — Progress Notes (Signed)
Patient ID: Robert Sparks, male    DOB: 06/12/1946  MRN: [id number]  CC: re-establish   Subjective: Robert Sparks is a 71 y.o. male who presents for chronic disease management. Last seen by Dr. Clide Dales 11/2016 Patient with history of HTN, diabetes type 2, HL, CKD stage 3, tobacco dependence, EtOH abuse, prostate CA and recently diagnosed multiple myeloma IgA lambda followed by hem/onc Dr. Lebron Conners.   1. Myeloma: -he has started weekly chemo treatments. 3 so far -low potassium. Given supplement this wk -also worsening anemia. Endorses dizziness (when he stands up suddenly) and tiredness. States he was offered blood transfusion but declined.  Has appt tomorrow and thinks he will requested  2. DM/neuropathy: checks BS 2-3 x a day -reports BS up to 200s this a.m. Takes Dexamethasone 20 mg once a wk. Compliant with Novolog 70/30 5 units BID -lives with a friend, they both cook. He tries to avoids sweet drinks and snacks. -over due for eye exam - Dr Schuyler Amor. Has cataracts -sharp pains and numbness in feet and toes. Gabapentin helps.  3. HTN: On Norvasc 5 mg -limits alt No CP/SOB/LE edema  4. HL: compliant with Pravastatin  5. Callous on feet. Has appt with podiatry later this mth  HM: due for flu and Pneumonia. His cancer doc advised holding off for now since patient receiving chemotherapy stating that vaccine is unlikely to be effective Patient Active Problem List   Diagnosis Date Noted  . Rash, skin 03/23/2017  . Elevated serum creatinine 03/23/2017  . Hypokalemia 03/23/2017  . Multiple myeloma not having achieved remission ([place]) 02/23/2017  . Oral leukoplakia 02/09/2017  . Lightheadedness 11/10/2016  . Tobacco abuse 06/21/2016  . Hyperlipidemia 06/21/2016  . CKD stage 3 due to type 2 diabetes mellitus ([place]) 02/23/2016  . Malignant neoplasm of prostate ([place]) 02/21/2016  . Diabetic ketoacidosis without coma associated with other specified diabetes mellitus ([place])   . Controlled  type 2 diabetes mellitus with stage 3 chronic kidney disease, with long-term current use of insulin ([place]) 03/29/2015  . HTN (hypertension) 07/28/2013  . Renal failure (ARF), acute on chronic (HCC) 07/27/2013  . Anemia, chronic disease 07/26/2013  . Hypoglycemia 07/25/2013  . Acute encephalopathy 07/25/2013  . Sepsis ([place]) 07/25/2013  . Hypothermia 07/25/2013  . ETOH abuse 07/25/2013  . Metabolic acidosis 52/77/8242  . ARF (acute renal failure) ([place]) 07/25/2013     Current Outpatient Prescriptions on File Prior to Visit  Medication Sig Dispense Refill  . ACCU-CHEK AVIVA PLUS test strip See admin instructions.  12  . acyclovir (ZOVIRAX) 400 MG tablet Take 1 tablet (400 mg total) by mouth 2 (two) times daily. 60 tablet 3  . amLODipine (NORVASC) 5 MG tablet Take 1 tablet (5 mg total) by mouth daily. 90 tablet 3  . Ascorbic Acid (VITAMIN C) 1000 MG tablet Take 1,000 mg by mouth daily.    [name] aspirin EC 81 MG tablet Take 1 tablet (81 mg total) by mouth daily. 90 tablet 3  . Cholecalciferol (VITAMIN D) 2000 units CAPS Take 2,000 Units by mouth daily.    [name] dexamethasone (DECADRON) 4 MG tablet TAKE 5 TABLETS BY MOUTH ONCE A WEEK. 15 tablet 0  . folic acid (FOLVITE) 1 MG tablet Take 1 tablet (1 mg total) by mouth daily. 60 tablet 0  . gabapentin (NEURONTIN) 300 MG capsule Take 1 capsule (300 mg total) by mouth 4 (four) times daily. Take 1 tab po am and lunch, 2 tabs at night. 120 capsule 2  .  insulin aspart protamine - aspart (NOVOLOG 70/30 MIX) (70-30) 100 UNIT/ML FlexPen Inject 0.05 mLs (5 Units total) into the skin 2 (two) times daily with a meal. 6 mL 2  . lenalidomide (REVLIMID) 10 MG capsule Take 1 capsule (10 mg total) by mouth daily. Take 1 capsule (82m total) by mouth days 1-14 of every 21 days. Auth# [id number] capsule 0  . lidocaine-prilocaine (EMLA) cream Apply to affected area once 30 g 3  . LORazepam (ATIVAN) 0.5 MG tablet Take 1 tablet (0.5 mg total) by mouth every 6 (six) hours as  needed (Nausea or vomiting). 30 tablet 0  . mometasone (ELOCON) 0.1 % cream Apply 1 application topically daily. 45 g 3  . Multiple Vitamin (MULTIVITAMIN WITH MINERALS) TABS tablet Take 1 tablet by mouth daily. 60 tablet 2  . ondansetron (ZOFRAN) 8 MG tablet Take 1 tablet (8 mg total) by mouth 2 (two) times daily as needed (Nausea or vomiting). 30 tablet 1  . potassium chloride (K-DUR) 10 MEQ tablet TAKE 1 TABLET BY MOUTH DAILY. 7 tablet 0  . pravastatin (PRAVACHOL) 20 MG tablet Take 1 tablet (20 mg total) by mouth daily. 90 tablet 3  . prochlorperazine (COMPAZINE) 10 MG tablet Take 1 tablet (10 mg total) by mouth every 6 (six) hours as needed (Nausea or vomiting). 30 tablet 1  . senna-docusate (SENOKOT-S) 8.6-50 MG tablet Take 2 tablets by mouth 2 (two) times daily. 60 tablet 2  . sulfamethoxazole-trimethoprim (BACTRIM) 400-80 MG tablet Take 1 tablet by mouth daily. 21 tablet 1  . thiamine 100 MG tablet Take 1 tablet (100 mg total) by mouth daily. 60 tablet 0   Current Facility-Administered Medications on File Prior to Visit  Medication Dose Route Frequency Provider Last Rate Last Dose  . 0.9 %  sodium chloride infusion  500 mL Intravenous Continuous Pyrtle, JLajuan Lines MD        No Known Allergies  Social History   Social History  . Marital status: Single    Spouse name: N/A  . Number of children: N/A  . Years of education: N/A   Occupational History  . Not on file.   Social History Main Topics  . Smoking status: Current Every Day Smoker    Packs/day: 0.25    Years: 50.00    Types: Cigarettes  . Smokeless tobacco: Never Used     Comment: 1 ppwk-4-5 a day   . Alcohol use 0.6 oz/week    1 Cans of beer per week     Comment: 1 every day-quit couple weeks ago  . Drug use: No  . Sexual activity: Not Currently   Other Topics Concern  . Not on file   Social History Narrative  . No narrative on file    Family History  Problem Relation Age of Onset  . Cancer Neg Hx   . Colon  cancer Neg Hx   . Colon polyps Neg Hx   . Esophageal cancer Neg Hx   . Rectal cancer Neg Hx   . Stomach cancer Neg Hx     Past Surgical History:  Procedure Laterality Date  . CIRCUMCISION  1990's  . PROSTATE BIOPSY N/A 12/21/2015   Procedure: BIOPSY TRANSRECTAL ULTRASONIC PROSTATE (TUBP);  Surgeon: MFestus Aloe MD;  [facility]  Service: Urology;  Laterality: N/A;    ROS: Review of Systems  Constitutional: Negative for appetite change.       Feels tired a lot. Sleeping ok  Respiratory: Negative for cough  and shortness of breath.   Cardiovascular: Positive for leg swelling. Negative for chest pain.  Gastrointestinal: Positive for constipation (takes OTC stool softner. On iron).  Musculoskeletal:       No falls.  Neurological: Positive for dizziness.    PHYSICAL EXAM: BP 136/76   Pulse 78   Temp 98.7 F (37.1 C) (Oral)   Resp 16   Wt 161 lb 9.6 oz (73.3 kg)   SpO2 98%   BMI 27.74 kg/m   Wt Readings from Last 3 Encounters:  04/12/17 161 lb 9.6 oz (73.3 kg)  04/05/17 165 lb 9.6 oz (75.1 kg)  03/30/17 162 lb 6.4 oz (73.7 kg)  130/70 sit, 132/64 standing Physical Exam  General appearance - alert, elderly AAM and in no distress Mental status - alert, oriented to person, place, and time, normal mood, behavior, speech, dress, motor activity, and thought processes Eyes - pupils equal and reactive, extraocular eye movements intact Mouth - mucous membranes moist, pharynx normal without lesions Neck - supple, no significant adenopathy Chest - clear to auscultation, no wheezes, rales or rhonchi, symmetric air entry Heart - normal rate, regular rhythm, normal S1, S2, no murmurs, rubs, clicks or gallops Extremities -no Le edema  Diabetic Foot Exam - Simple   Simple Foot Form Visual Inspection See comments:  Yes Sensation Testing Intact to touch and monofilament testing bilaterally:  Yes Pulse Check Posterior Tibialis and Dorsalis pulse intact  bilaterally:  Yes Comments callous on sole of RT big toe and lateral ball LT big toe.      Lab Results  Component Value Date   WBC 5.0 04/05/2017   HGB 7.4 (L) 04/05/2017   HCT 22.4 (L) 04/05/2017   MCV 97.5 04/05/2017   PLT 280 04/05/2017     Chemistry      Component Value Date/Time   NA 138 04/05/2017 1400   K 3.2 (L) 04/05/2017 1400   CL 105 11/21/2016 1416   CO2 21 (L) 04/05/2017 1400   BUN 27.2 (H) 04/05/2017 1400   CREATININE 2.2 (H) 04/05/2017 1400      Component Value Date/Time   CALCIUM 8.9 04/05/2017 1400   ALKPHOS 75 04/05/2017 1400   AST 12 04/05/2017 1400   ALT 7 04/05/2017 1400   BILITOT 0.44 04/05/2017 1400     Results for orders placed or performed in visit on 04/12/17  POCT glucose (manual entry)  Result Value Ref Range   POC Glucose 115 (A) 70 - 99 mg/dl  POCT glycosylated hemoglobin (Hb A1C)  Result Value Ref Range   Hemoglobin A1C 7.3     ASSESSMENT AND PLAN: 1. Type 2 diabetes, controlled, with peripheral neuropathy (HCC) -Close to goal. Continue NovoLog Mix 70/30 5 units twice a day with meals. Advised to increase to 7 units twice a day on days when he has to take dexamethasone - POCT glucose (manual entry) - POCT glycosylated hemoglobin (Hb A1C) - Ambulatory referral to Ophthalmology  2. Essential hypertension -At goal. Continue Norvasc  3. CKD (chronic kidney disease) stage 3, GFR 30-59 ml/min (HCC) -Try to avoid nephrotoxic drugs  4. Anemia, unspecified type -Associated with chemotherapy. Patient states he will speak with his oncologist tomorrow about blood transfusion that was offered in the past to see if it will improve her fatigue  5. Pre-ulcerative corn or callous Keep appointment with podiatry later this month. Discussed good diabetic foot care  6. Cataract of both eyes, unspecified cataract type - Ambulatory referral to Ophthalmology  7. Hyperlipidemia, unspecified  hyperlipidemia type Continue Pravastatin  8.  Hyperkalemia ? Due to chemo. On replacement  9. Tobacco dependence Encourage smoking cessation  Patient wanted to discuss problems with memory. We were out of time at that point; he is agreeable to coming back in 6 weeks.  HM: Oncologist advise holding off on giving flu and pneumonia vaccine Patient was given the opportunity to ask questions.  Patient verbalized understanding of the plan and was able to repeat key elements of the plan.   Orders Placed This Encounter  Procedures  . Ambulatory referral to Ophthalmology  . POCT glucose (manual entry)  . POCT glycosylated hemoglobin (Hb A1C)     Requested Prescriptions    No prescriptions requested or ordered in this encounter    Return in about 6 weeks (around 05/24/2017) for discusss memory issues.  Karle Plumber, MD, FACP

## 2017-04-12 NOTE — Progress Notes (Signed)
Robert Sparks Follow-up Visit:  Assessment: Multiple myeloma not having achieved remission ([place]) 71 y.o. male with diagnosis of IgA lambda active multiple myeloma, ISS Stage I. Active disease diagnosed based on presence of anemia, kidney insufficiency, and paraproteinemia with significant predominance of lambda light chains as well as significant elevation of IgA. Bone marrow biopsy confirms presence of atypical monoclonal plasma cell process in the bone marrow comprising at least 7% of the cellularity.  Based on the findings, patient was started on treatment with lenalidomide, bortezomib, and low-dose dexamethasone based on the anticipated tolerance by the patient. Lenalidomide was started at 10 mg daily based on creatinine clearance of 42, dexamethasone dose was reduced to 20 mg weekly based on patient's age. Since education of therapy, patient developed new rash and his creatinine has gotten higher, but the creatinine clearance does not exceed the safe age to continue current therapy unchanged. Rash is improving on steroid therapy. Creatinine is now stable. Lab work today demonstrates biochemical response to therapy with rapidly decreasing values of IgA and lambda light chains. Patient does have severe anemia now, likely as a result of the chemotherapy.  Plan: --Continue prophylactic acyclovir and low-dose Bactrim to reduce the risk of VZV reactivation and PCP infection respectively --He is receiving aspirin and that should service efficient thromboprophylaxis in the context of lower dose of thalidomide --Delayed initiation of the second cycle of the systemic therapy to allow for anemia recovery. --RTC 1wk for lab work and possible RVD: At this time, we will attempt and treat with the same doses of medications, but if anemia persists, dose reduction will be necessary  Voice recognition software was used and creation of this note. Despite my best effort at editing the text, some  misspelling/errors may have occurred.  Orders Placed This Encounter  Procedures  . CBC with Differential    Standing Status:   Future    Standing Expiration Date:   04/05/2018  . Comprehensive metabolic panel    Standing Status:   Future    Standing Expiration Date:   04/05/2018    Sparks Staging Multiple myeloma not having achieved remission ([place]) Staging form: Plasma Cell Myeloma and Plasma Cell Disorders, AJCC 8th Edition - Clinical stage from 03/21/2017: RISS Stage I (Beta-2-microglobulin (mg/L): 3.2, Albumin (g/dL): 3.6, ISS: Stage I, High-risk cytogenetics: Absent, LDH: Normal) - Signed by Robert Sax, MD on 04/04/2017 - Clinical: No stage assigned - Unsigned   All questions were answered.  . The patient knows to call the clinic with any problems, questions or concerns.  This note was electronically signed.    History of Presenting Illness Robert Sparks is a 71 y.o. male followed in the [place] for new diagnosis of IgA lambda active multiple myeloma. At presentation, Robert Sparks reported having some night sweats, but no weight loss. He indicated weakness in bilateral thighs associated with the feet and legs. Complained of constipation, but denies any numbness or tingling in hands and feet. Denied any new musculoskeletal pain. No chest pain, shortness of breath or cough. No nausea, vomiting, abdominal pain, diarrhea, or early satiety. Additional evaluation was conducted resulting in negative imaging including negative skeletal survey on PET/CT. Bone marrow biopsy returned positive with 7% involvement with atypical monoclonal plasma cells. In conjunction with kidney injury, anemia, dysproteinemia with significant elevation of lambda light chains and IgA, this confirmed a diagnosis of active multiple myeloma warranting intervention.   Patient was initiated on systemic induction chemotherapy with lenalidomide, bortezomib, and  dexamethasone. Patient reports interval  improvement in the rash AFTER initiation of therapy. Patient has minimal residual pruritus. He also complains of constipation, and easily fatigable hips and lower back. Denies any other new complaints.  Oncological/hematological History: --Labs, 10/28/15: WBC 6.0, Hgb 10.3, Plt 245;   --Labs, 10/02/16: WBC 5.1, Hgb 10.0, Plt 227; ANA -- negative --Labs, 11/10/16: tProt 6.3, Alb 3.4, Ca   9.1, Cr 2.56, AP 82; WBC 4.7, Hgb 8.1, Plt 201; Fe 43, FeSat 16%, TIBC 272, Ferritin 476, Vit B12 199, MMA 283; TSH 1.57 --Labs, 12/25/16: tProt 7.0, Alb 4.1, Ca   9.9, Cr 1.47, AP ...; SPEP -- M-spike 0.8g/dL; kappa 40.7, lambda 305.1, KLR 0.13; WBC 3.4, Hgb 13.2, Plt 154; --Labs, 02/09/17: tProt 8.0, Alb 3.9, Ca 10.1, Cr 1.80, AP 97; SPEP -- M-spike 0.7g.dL, IgA lambda; IgA 1220, IgG 666, IgM 52; kappa 33.5, lambda 433.5, KLR 0.08; WBC 6.3, Hgb 10.6, Plt 194 --Labs, 04/05/17: tProt 7.0, Alb 3.5, Ca   8.9, Cr 2.20, AP 75; SPEP -- M-spike 0.4g/dL, IgA lambda; IgA   433, IgG 683, IgM 52; kappa 59.7, lambda   42.7, KLR 1.40; WBC 5.0, Hgb   7.4, Plt 280;    Multiple myeloma not having achieved remission ([place])   02/16/2017 Imaging    Skeletal Survey: No definite lytic myelomatous lesions in the axial or appendicular skeleton      02/23/2017 Initial Diagnosis    Multiple myeloma not having achieved remission ([place])     03/09/2017 Imaging    PET-CT: No abnormal hypermetabolic activity or other specific findings of active myeloma. Several adjacent healing left lateral rib fractures.      03/13/2017 Pathology Results    Bone Marrow Bx: HYPERCELLULAR BONE MARROW FOR AGE WITH PLASMA CELL NEOPLASM. TRILINEAGE HEMATOPOIESIS. NORMOCYTIC-NORMOCHROMIA ANEMIA. EOSINOPHILIA. The bone marrow shows slight increase in atypical plasma cells representing 7% of all cells associated with small clusters in the clot and biopsy sections. Immunohistochemical stains highlight the plasma cell component in the bone marrow which shows lambda  light chain restriction consistent with plasma cell neoplasm. The background shows trilineage hematopoiesis with non-specific myeloid changes. The peripheral blood shows eosinophilia without an associated increase in eosinophils in the bone marrow. The changes may represent a hypersensitivity reaction. Cytogenetics/FISH: 46,XY -- Positive for +11, +17/17p+ -- extra CCND1 copy in 9% cells, gain of ATM copy in 9% & 11% cells with p53 gain.        03/16/2017 -  Chemotherapy    Induction chemotherapy: Lenalidomide 42m PO Qday, d1-14 + Bortezomib 1.36mm2 IV d1,4,8,11 + dexamethasone 2052mO QWk Q21d --Cycle #1, 09/96/22/29omplicated by grade 2 rash within 1 week of initiation, as well as a aggressive rising creatinine; d11 held due to worsening rash        Medical History: Past Medical History:  Diagnosis Date  . Anemia   . Arthritis    shoulders,feet   . Cataract    per eye dr -has appt 5-11  . CKD (chronic kidney disease), stage III [place] . Elevated PSA   . History of ketoacidosis    03-29-2015   . History of sepsis    07-25-2013  non-compliant w/ medication  . Hyperlipidemia   . Hypertension   . Nocturia   . Peripheral neuropathy   . Prostate [place] ([facility] . Type 2 diabetes mellitus with insulin therapy ([facility]   Surgical History: Past Surgical History:  Procedure Laterality Date  . CIRCUMCISION  1990's  .  PROSTATE BIOPSY N/A 12/21/2015   Procedure: BIOPSY TRANSRECTAL ULTRASONIC PROSTATE (TUBP);  Surgeon: Festus Aloe, MD;  [facility];  Service: Urology;  Laterality: N/A;    Family History: Family History  Problem Relation Age of Onset  . Sparks Neg Hx   . Colon Sparks Neg Hx   . Colon polyps Neg Hx   . Esophageal Sparks Neg Hx   . Rectal Sparks Neg Hx   . Stomach Sparks Neg Hx     Social History: Social History   Social History  . Marital status: Single    Spouse name: N/A  . Number of children: N/A  . Years of education: N/A    Occupational History  . Not on file.   Social History Main Topics  . Smoking status: Current Every Day Smoker    Packs/day: 0.25    Years: 50.00    Types: Cigarettes  . Smokeless tobacco: Never Used     Comment: 1 ppwk-4-5 a day   . Alcohol use 0.6 oz/week    1 Cans of beer per week     Comment: 1 every day-quit couple weeks ago  . Drug use: No  . Sexual activity: Not Currently   Other Topics Concern  . Not on file   Social History Narrative  . No narrative on file    Allergies: No Known Allergies  Medications:  Current Outpatient Prescriptions  Medication Sig Dispense Refill  . ACCU-CHEK AVIVA PLUS test strip See admin instructions.  12  . acyclovir (ZOVIRAX) 400 MG tablet Take 1 tablet (400 mg total) by mouth 2 (two) times daily. 60 tablet 3  . amLODipine (NORVASC) 5 MG tablet Take 1 tablet (5 mg total) by mouth daily. 90 tablet 3  . Ascorbic Acid (VITAMIN C) 1000 MG tablet Take 1,000 mg by mouth daily.    [name] aspirin EC 81 MG tablet Take 1 tablet (81 mg total) by mouth daily. 90 tablet 3  . Cholecalciferol (VITAMIN D) 2000 units CAPS Take 2,000 Units by mouth daily.    [name] dexamethasone (DECADRON) 4 MG tablet TAKE 5 TABLETS BY MOUTH ONCE A WEEK. 15 tablet 0  . ferrous sulfate 325 (65 FE) MG tablet Take 325 mg by mouth daily with breakfast.    . folic acid (FOLVITE) 1 MG tablet Take 1 tablet (1 mg total) by mouth daily. 60 tablet 0  . gabapentin (NEURONTIN) 300 MG capsule Take 1 capsule (300 mg total) by mouth 4 (four) times daily. Take 1 tab po am and lunch, 2 tabs at night. 120 capsule 2  . insulin aspart protamine - aspart (NOVOLOG 70/30 MIX) (70-30) 100 UNIT/ML FlexPen Inject 0.05 mLs (5 Units total) into the skin 2 (two) times daily with a meal. 6 mL 2  . lenalidomide (REVLIMID) 10 MG capsule Take 1 capsule (10 mg total) by mouth daily. Take 1 capsule (80m total) by mouth days 1-14 of every 21 days. Auth# [id number] capsule 0  . lidocaine-prilocaine (EMLA) cream Apply  to affected area once 30 g 3  . LORazepam (ATIVAN) 0.5 MG tablet Take 1 tablet (0.5 mg total) by mouth every 6 (six) hours as needed (Nausea or vomiting). 30 tablet 0  . mometasone (ELOCON) 0.1 % cream Apply 1 application topically daily. 45 g 3  . Multiple Vitamin (MULTIVITAMIN WITH MINERALS) TABS tablet Take 1 tablet by mouth daily. 60 tablet 2  . ondansetron (ZOFRAN) 8 MG tablet Take 1 tablet (8 mg total) by mouth 2 (  two) times daily as needed (Nausea or vomiting). 30 tablet 1  . potassium chloride (K-DUR) 10 MEQ tablet TAKE 1 TABLET BY MOUTH DAILY. 7 tablet 0  . pravastatin (PRAVACHOL) 20 MG tablet Take 1 tablet (20 mg total) by mouth daily. 90 tablet 3  . prochlorperazine (COMPAZINE) 10 MG tablet Take 1 tablet (10 mg total) by mouth every 6 (six) hours as needed (Nausea or vomiting). 30 tablet 1  . senna-docusate (SENOKOT-S) 8.6-50 MG tablet Take 2 tablets by mouth 2 (two) times daily. 60 tablet 2  . sulfamethoxazole-trimethoprim (BACTRIM) 400-80 MG tablet Take 1 tablet by mouth daily. 21 tablet 1  . thiamine 100 MG tablet Take 1 tablet (100 mg total) by mouth daily. 60 tablet 0   Current Facility-Administered Medications  Medication Dose Route Frequency Provider Last Rate Last Dose  . 0.9 %  sodium chloride infusion  500 mL Intravenous Continuous Pyrtle, Lajuan Lines, MD        Review of Systems: Review of Systems  Constitutional: Positive for diaphoresis and fatigue. Negative for unexpected weight change.  HENT:  Negative.   Eyes: Negative.   Respiratory: Negative.   Cardiovascular: Negative.   Gastrointestinal: Positive for constipation.  Endocrine: Negative.   Genitourinary: Negative.    Skin: Positive for itching and rash.  Neurological: Positive for extremity weakness.  Hematological: Negative.   Psychiatric/Behavioral: Negative.      PHYSICAL EXAMINATION Blood pressure (!) 108/55, pulse 78, temperature 99.1 F (37.3 C), temperature source Oral, resp. rate 18, height _0   (1.626 m), weight 165 lb 9.6 oz (75.1 kg), SpO2 100 %.  ECOG PERFORMANCE STATUS: 2 - Symptomatic, <50% confined to bed  Physical Exam  Constitutional: He is oriented to person, place, and time and well-developed, well-nourished, and in no distress. No distress.  HENT:  Head: Normocephalic and atraumatic.  Patient still has some of his lower front teeth left. These appear to be in good health without evidence of infection. Dissemination of the oral cavity also reveals presence of a white patch over the posterior left lower gum without ulceration or bleeding. No other areas of abnormalities noted.  Eyes: Pupils are equal, round, and reactive to light. Conjunctivae and EOM are normal. No scleral icterus.  Neck: No thyromegaly present.  Cardiovascular: Normal rate and regular rhythm.   No murmur heard. Pulmonary/Chest: Effort normal and breath sounds normal. No respiratory distress. He has no wheezes.  Abdominal: Soft. He exhibits no distension and no mass. There is no tenderness.  Musculoskeletal: He exhibits no edema or tenderness.  Lymphadenopathy:    He has no cervical adenopathy.  Neurological: He is alert and oriented to person, place, and time. He has normal reflexes. No cranial nerve deficit.  Skin: Skin is warm and dry. Rash noted. He is not diaphoretic. No erythema.  Significant interval improvement in the extent and appearance of the cutaneous rash.     LABORATORY DATA: I have personally reviewed the data as listed: Appointment on 04/05/2017  Component Date Value Ref Range Status  . WBC 04/05/2017 5.0  4.0 - 10.3 10e3/uL Final  . NEUT# 04/05/2017 3.5  1.5 - 6.5 10e3/uL Final  . HGB 04/05/2017 7.4* 13.0 - 17.1 g/dL Final  . HCT 04/05/2017 22.4* 38.4 - 49.9 % Final  . Platelets 04/05/2017 280  140 - 400 10e3/uL Final  . MCV 04/05/2017 97.5  79.3 - 98.0 fL Final  . MCH 04/05/2017 32.2  27.2 - 33.4 pg Final  . MCHC 04/05/2017 33.0  32.0 -  36.0 g/dL Final  . RBC 04/05/2017 2.30*  4.20 - 5.82 10e6/uL Final  . RDW 04/05/2017 15.8* 11.0 - 14.6 % Final  . lymph# 04/05/2017 0.8* 0.9 - 3.3 10e3/uL Final  . MONO# 04/05/2017 0.6  0.1 - 0.9 10e3/uL Final  . Eosinophils Absolute 04/05/2017 0.2  0.0 - 0.5 10e3/uL Final  . Basophils Absolute 04/05/2017 0.0  0.0 - 0.1 10e3/uL Final  . NEUT% 04/05/2017 68.7  39.0 - 75.0 % Final  . LYMPH% 04/05/2017 16.8  14.0 - 49.0 % Final  . MONO% 04/05/2017 11.3  0.0 - 14.0 % Final  . EOS% 04/05/2017 3.1  0.0 - 7.0 % Final  . BASO% 04/05/2017 0.1  0.0 - 2.0 % Final  . Sodium 04/05/2017 138  136 - 145 mEq/L Final  . Potassium 04/05/2017 3.2* 3.5 - 5.1 mEq/L Final  . Chloride 04/05/2017 106  98 - 109 mEq/L Final  . CO2 04/05/2017 21* 22 - 29 mEq/L Final  . Glucose 04/05/2017 179* 70 - 140 mg/dl Final   Glucose reference range is for nonfasting patients. Fasting glucose reference range is 70- 100.  [name] BUN 04/05/2017 27.2* 7.0 - 26.0 mg/dL Final  . Creatinine 04/05/2017 2.2* 0.7 - 1.3 mg/dL Final  . Total Bilirubin 04/05/2017 0.44  0.20 - 1.20 mg/dL Final  . Alkaline Phosphatase 04/05/2017 75  40 - 150 U/L Final  . AST 04/05/2017 12  5 - 34 U/L Final  . ALT 04/05/2017 7  0 - 55 U/L Final  . Total Protein 04/05/2017 7.0  6.4 - 8.3 g/dL Final  . Albumin 04/05/2017 3.5  3.5 - 5.0 g/dL Final  . Calcium 04/05/2017 8.9  8.4 - 10.4 mg/dL Final  . Anion Gap 04/05/2017 10  3 - 11 mEq/L Final  . EGFR 04/05/2017 34* >90 ml/min/1.73 m2 Final   eGFR is calculated using the CKD-EPI Creatinine Equation (2009)  . Magnesium 04/05/2017 2.1  1.5 - 2.5 mg/dl Final  . Total Protein 04/05/2017 6.4  6.0 - 8.5 g/dL Final  . Albumin 04/05/2017 3.3  2.9 - 4.4 g/dL Final  . Alpha 1 04/05/2017 0.3  0.0 - 0.4 g/dL Final  . Alpha 2 04/05/2017 0.9  0.4 - 1.0 g/dL Final  . Beta 04/05/2017 1.1  0.7 - 1.3 g/dL Final  . Gamma Globulin 04/05/2017 0.7  0.4 - 1.8 g/dL Final  . M-Spike, % 04/05/2017 0.4* Not Observed g/dL Final  . GLOBULIN, TOTAL 04/05/2017 3.1  2.2 - 3.9  g/dL Final  . A/G Ratio 04/05/2017 1.1  0.7 - 1.7 Final  . Please Note: 04/05/2017 Comment   Final   Comment: Protein electrophoresis scan will follow via computer, mail, or courier delivery.   . Interpretation(See Below) 04/05/2017 .   Final  . IFE 1 04/05/2017 Comment   Final   Comment: Immunofixation shows IgA monoclonal protein with lambda light chain specificity.   Harriet Pho Free Light Chain 04/05/2017 59.7* 3.3 - 19.4 mg/L Final  . Ig Lambda Free Light Chain 04/05/2017 42.7* 5.7 - 26.3 mg/L Final  . Kappa/Lambda FluidC Ratio 04/05/2017 1.40  0.26 - 1.65 Final  . IgG, Qn, Serum 04/05/2017 683* 700 - 1,600 mg/dL Final  . IgA, Qn, Serum 04/05/2017 433  61 - 437 mg/dL Final  . IgM, Qn, Serum 04/05/2017 52  20 - 172 mg/dL Final  Appointment on 03/30/2017  Component Date Value Ref Range Status  . WBC 03/30/2017 6.4  4.0 - 10.3 10e3/uL Final  . NEUT# 03/30/2017 4.0  1.5 - 6.5 10e3/uL Final  . HGB 03/30/2017 8.1* 13.0 - 17.1 g/dL Final  . HCT 03/30/2017 24.7* 38.4 - 49.9 % Final  . Platelets 03/30/2017 235  140 - 400 10e3/uL Final  . MCV 03/30/2017 98.4* 79.3 - 98.0 fL Final  . MCH 03/30/2017 32.3  27.2 - 33.4 pg Final  . MCHC 03/30/2017 32.8  32.0 - 36.0 g/dL Final  . RBC 03/30/2017 2.51* 4.20 - 5.82 10e6/uL Final  . RDW 03/30/2017 14.4  11.0 - 14.6 % Final  . lymph# 03/30/2017 0.4* 0.9 - 3.3 10e3/uL Final  . MONO# 03/30/2017 0.8  0.1 - 0.9 10e3/uL Final  . Eosinophils Absolute 03/30/2017 1.2* 0.0 - 0.5 10e3/uL Final  . Basophils Absolute 03/30/2017 0.0  0.0 - 0.1 10e3/uL Final  . NEUT% 03/30/2017 62.0  39.0 - 75.0 % Final  . LYMPH% 03/30/2017 6.7* 14.0 - 49.0 % Final  . MONO% 03/30/2017 12.6  0.0 - 14.0 % Final  . EOS% 03/30/2017 18.5* 0.0 - 7.0 % Final  . BASO% 03/30/2017 0.2  0.0 - 2.0 % Final  . Sodium 03/30/2017 140  136 - 145 mEq/L Final  . Potassium 03/30/2017 3.3* 3.5 - 5.1 mEq/L Final  . Chloride 03/30/2017 107  98 - 109 mEq/L Final  . CO2 03/30/2017 21* 22 - 29  mEq/L Final  . Glucose 03/30/2017 108  70 - 140 mg/dl Final   Glucose reference range is for nonfasting patients. Fasting glucose reference range is 70- 100.  [name] BUN 03/30/2017 35.3* 7.0 - 26.0 mg/dL Final  . Creatinine 03/30/2017 2.6* 0.7 - 1.3 mg/dL Final  . Total Bilirubin 03/30/2017 0.51  0.20 - 1.20 mg/dL Final  . Alkaline Phosphatase 03/30/2017 89  40 - 150 U/L Final  . AST 03/30/2017 10  5 - 34 U/L Final  . ALT 03/30/2017 9  0 - 55 U/L Final  . Total Protein 03/30/2017 6.8  6.4 - 8.3 g/dL Final  . Albumin 03/30/2017 3.4* 3.5 - 5.0 g/dL Final  . Calcium 03/30/2017 9.2  8.4 - 10.4 mg/dL Final  . Anion Gap 03/30/2017 12* 3 - 11 mEq/L Final  . EGFR 03/30/2017 28* >90 ml/min/1.73 m2 Final   eGFR is calculated using the CKD-EPI Creatinine Equation (2009)  . Magnesium 03/30/2017 2.8* 1.5 - 2.5 mg/dl Final  . Phosphorus, Ser 03/30/2017 3.3  2.5 - 4.5 mg/dL Final       Robert Sax, MD

## 2017-04-12 NOTE — Patient Instructions (Signed)
Continue taking insulin 5 units twice a day with meals. On days that you take the medication dexamethasone,  take 7 units twice a day with meals.

## 2017-04-13 ENCOUNTER — Encounter: Payer: Self-pay | Admitting: Hematology and Oncology

## 2017-04-13 ENCOUNTER — Ambulatory Visit (HOSPITAL_BASED_OUTPATIENT_CLINIC_OR_DEPARTMENT_OTHER): Payer: Medicare HMO | Admitting: Hematology and Oncology

## 2017-04-13 ENCOUNTER — Ambulatory Visit (HOSPITAL_BASED_OUTPATIENT_CLINIC_OR_DEPARTMENT_OTHER): Payer: Medicare HMO

## 2017-04-13 ENCOUNTER — Telehealth: Payer: Self-pay | Admitting: Hematology and Oncology

## 2017-04-13 ENCOUNTER — Other Ambulatory Visit (HOSPITAL_BASED_OUTPATIENT_CLINIC_OR_DEPARTMENT_OTHER): Payer: Medicare HMO

## 2017-04-13 VITALS — BP 141/74 | HR 76 | Temp 98.7°F | Resp 18 | Ht 64.0 in | Wt 159.5 lb

## 2017-04-13 DIAGNOSIS — C9000 Multiple myeloma not having achieved remission: Secondary | ICD-10-CM

## 2017-04-13 DIAGNOSIS — Z5111 Encounter for antineoplastic chemotherapy: Secondary | ICD-10-CM

## 2017-04-13 DIAGNOSIS — Z5112 Encounter for antineoplastic immunotherapy: Secondary | ICD-10-CM

## 2017-04-13 LAB — COMPREHENSIVE METABOLIC PANEL
ALT: 8 U/L (ref 0–55)
AST: 12 U/L (ref 5–34)
Albumin: 3.5 g/dL (ref 3.5–5.0)
Alkaline Phosphatase: 60 U/L (ref 40–150)
Anion Gap: 10 mEq/L (ref 3–11)
BUN: 25.1 mg/dL (ref 7.0–26.0)
CALCIUM: 9.6 mg/dL (ref 8.4–10.4)
CHLORIDE: 111 meq/L — AB (ref 98–109)
CO2: 19 meq/L — AB (ref 22–29)
Creatinine: 2.1 mg/dL — ABNORMAL HIGH (ref 0.7–1.3)
EGFR: 36 mL/min/{1.73_m2} — ABNORMAL LOW (ref >90)
GLUCOSE: 128 mg/dL (ref 70–140)
POTASSIUM: 4 meq/L (ref 3.5–5.1)
SODIUM: 140 meq/L (ref 136–145)
Total Bilirubin: 0.42 mg/dL (ref 0.20–1.20)
Total Protein: 6.7 g/dL (ref 6.4–8.3)

## 2017-04-13 LAB — CBC WITH DIFFERENTIAL/PLATELET
BASO%: 3.2 % — AB (ref 0.0–2.0)
Basophils Absolute: 0.1 10*3/uL (ref 0.0–0.1)
EOS%: 3.5 % (ref 0.0–7.0)
Eosinophils Absolute: 0.1 10*3/uL (ref 0.0–0.5)
HCT: 23.3 % — ABNORMAL LOW (ref 38.4–49.9)
HGB: 7.4 g/dL — ABNORMAL LOW (ref 13.0–17.1)
LYMPH%: 21.7 % (ref 14.0–49.0)
MCH: 30.8 pg (ref 27.2–33.4)
MCHC: 31.8 g/dL — AB (ref 32.0–36.0)
MCV: 97.1 fL (ref 79.3–98.0)
MONO#: 0.5 10*3/uL (ref 0.1–0.9)
MONO%: 13 % (ref 0.0–14.0)
NEUT#: 2.4 10*3/uL (ref 1.5–6.5)
NEUT%: 58.6 % (ref 39.0–75.0)
Platelets: ADEQUATE 10*3/uL (ref 140–400)
RBC: 2.4 10*6/uL — AB (ref 4.20–5.82)
RDW: 14.9 % — ABNORMAL HIGH (ref 11.0–14.6)
WBC: 4 10*3/uL (ref 4.0–10.3)
lymph#: 0.9 10*3/uL (ref 0.9–3.3)
nRBC: 0 % (ref 0–0)

## 2017-04-13 MED ORDER — BORTEZOMIB CHEMO SQ INJECTION 3.5 MG (2.5MG/ML)
1.3000 mg/m2 | Freq: Once | INTRAMUSCULAR | Status: AC
  Administered 2017-04-13: 2.25 mg via SUBCUTANEOUS
  Filled 2017-04-13: qty 2.25

## 2017-04-13 MED ORDER — PROCHLORPERAZINE MALEATE 10 MG PO TABS
10.0000 mg | ORAL_TABLET | Freq: Once | ORAL | Status: AC
  Administered 2017-04-13: 10 mg via ORAL

## 2017-04-13 MED ORDER — PROCHLORPERAZINE MALEATE 10 MG PO TABS
ORAL_TABLET | ORAL | Status: AC
  Filled 2017-04-13: qty 1

## 2017-04-13 NOTE — Progress Notes (Signed)
Ok to treat with CBC today. No Zometa today per Delle Reining, RN per MD Perlov.

## 2017-04-13 NOTE — Patient Instructions (Signed)
[facility] Discharge Instructions for Patients Receiving Chemotherapy  Today you received the following chemotherapy agents Velcade.  To help prevent nausea and vomiting after your treatment, we encourage you to take your nausea medication as directed.  If you develop nausea and vomiting that is not controlled by your nausea medication, call the clinic.   BELOW ARE SYMPTOMS THAT SHOULD BE REPORTED IMMEDIATELY:  *FEVER GREATER THAN 100.5 F  *CHILLS WITH OR WITHOUT FEVER  NAUSEA AND VOMITING THAT IS NOT CONTROLLED WITH YOUR NAUSEA MEDICATION  *UNUSUAL SHORTNESS OF BREATH  *UNUSUAL BRUISING OR BLEEDING  TENDERNESS IN MOUTH AND THROAT WITH OR WITHOUT PRESENCE OF ULCERS  *URINARY PROBLEMS  *BOWEL PROBLEMS  UNUSUAL RASH Items with * indicate a potential emergency and should be followed up as soon as possible.  Feel free to call the clinic should you have any questions or concerns. The clinic phone number is [phone].  Please show the CHEMO ALERT CARD at check-in to the Emergency Department and triage nurse.   

## 2017-04-13 NOTE — Telephone Encounter (Signed)
Scheduled appt per 10/5 los - patient to get new print out in the treatment area.

## 2017-04-16 ENCOUNTER — Other Ambulatory Visit: Payer: Self-pay | Admitting: Hematology and Oncology

## 2017-04-16 ENCOUNTER — Ambulatory Visit (HOSPITAL_BASED_OUTPATIENT_CLINIC_OR_DEPARTMENT_OTHER): Payer: Medicare HMO

## 2017-04-16 VITALS — BP 151/76 | HR 77 | Temp 98.1°F | Resp 17

## 2017-04-16 DIAGNOSIS — Z5112 Encounter for antineoplastic immunotherapy: Secondary | ICD-10-CM | POA: Diagnosis not present

## 2017-04-16 DIAGNOSIS — C9000 Multiple myeloma not having achieved remission: Secondary | ICD-10-CM

## 2017-04-16 MED ORDER — PROCHLORPERAZINE MALEATE 10 MG PO TABS
10.0000 mg | ORAL_TABLET | Freq: Once | ORAL | Status: AC
  Administered 2017-04-16: 10 mg via ORAL

## 2017-04-16 MED ORDER — SODIUM CHLORIDE 0.9 % IV SOLN
Freq: Once | INTRAVENOUS | Status: AC
  Administered 2017-04-16: 09:00:00 via INTRAVENOUS

## 2017-04-16 MED ORDER — BORTEZOMIB CHEMO SQ INJECTION 3.5 MG (2.5MG/ML)
1.3000 mg/m2 | Freq: Once | INTRAMUSCULAR | Status: AC
  Administered 2017-04-16: 2.25 mg via SUBCUTANEOUS
  Filled 2017-04-16: qty 2.25

## 2017-04-16 MED ORDER — ZOLEDRONIC ACID 4 MG/5ML IV CONC
3.3000 mg | Freq: Once | INTRAVENOUS | Status: AC
  Administered 2017-04-16: 3.3 mg via INTRAVENOUS
  Filled 2017-04-16: qty 4.13

## 2017-04-16 MED ORDER — PROCHLORPERAZINE MALEATE 10 MG PO TABS
ORAL_TABLET | ORAL | Status: AC
  Filled 2017-04-16: qty 1

## 2017-04-16 MED FILL — NOVOLOG MIX 70-30 FLEXPEN S: (70-30) 100 | 30 days supply | Qty: 3 | Fill #0

## 2017-04-16 NOTE — Patient Instructions (Signed)
[facility] Discharge Instructions for Patients Receiving Chemotherapy  Today you received the following chemotherapy agents :  Velcade.  To help prevent nausea and vomiting after your treatment, we encourage you to take your nausea medication as prescribed.   If you develop nausea and vomiting that is not controlled by your nausea medication, call the clinic.   BELOW ARE SYMPTOMS THAT SHOULD BE REPORTED IMMEDIATELY:  *FEVER GREATER THAN 100.5 F  *CHILLS WITH OR WITHOUT FEVER  NAUSEA AND VOMITING THAT IS NOT CONTROLLED WITH YOUR NAUSEA MEDICATION  *UNUSUAL SHORTNESS OF BREATH  *UNUSUAL BRUISING OR BLEEDING  TENDERNESS IN MOUTH AND THROAT WITH OR WITHOUT PRESENCE OF ULCERS  *URINARY PROBLEMS  *BOWEL PROBLEMS  UNUSUAL RASH Items with * indicate a potential emergency and should be followed up as soon as possible.  Feel free to call the clinic should you have any questions or concerns. The clinic phone number is [phone].  Please show the CHEMO ALERT CARD at check-in to the Emergency Department and triage nurse.   Zoledronic Acid injection (Hypercalcemia, Oncology) What is this medicine? ZOLEDRONIC ACID (ZOE le dron ik AS id) lowers the amount of calcium loss from bone. It is used to treat too much calcium in your blood from cancer. It is also used to prevent complications of cancer that has spread to the bone. This medicine may be used for other purposes; ask your health care provider or pharmacist if you have questions. COMMON BRAND NAME(S): Zometa What should I tell my health care provider before I take this medicine? They need to know if you have any of these conditions: -aspirin-sensitive asthma -cancer, especially if you are receiving medicines used to treat cancer -dental disease or wear dentures -infection -kidney disease -receiving corticosteroids like dexamethasone or prednisone -an unusual or allergic reaction to zoledronic acid, other  medicines, foods, dyes, or preservatives -pregnant or trying to get pregnant -breast-feeding How should I use this medicine? This medicine is for infusion into a vein. It is given by a health care professional in a hospital or clinic setting. Talk to your pediatrician regarding the use of this medicine in children. Special care may be needed. Overdosage: If you think you have taken too much of this medicine contact a poison control center or emergency room at once. NOTE: This medicine is only for you. Do not share this medicine with others. What if I miss a dose? It is important not to miss your dose. Call your doctor or health care professional if you are unable to keep an appointment. What may interact with this medicine? -certain antibiotics given by injection -NSAIDs, medicines for pain and inflammation, like ibuprofen or naproxen -some diuretics like bumetanide, furosemide -teriparatide -thalidomide This list may not describe all possible interactions. Give your health care provider a list of all the medicines, herbs, non-prescription drugs, or dietary supplements you use. Also tell them if you smoke, drink alcohol, or use illegal drugs. Some items may interact with your medicine. What should I watch for while using this medicine? Visit your doctor or health care professional for regular checkups. It may be some time before you see the benefit from this medicine. Do not stop taking your medicine unless your doctor tells you to. Your doctor may order blood tests or other tests to see how you are doing. Women should inform their doctor if they wish to become pregnant or think they might be pregnant. There is a potential for serious side effects to an unborn   child. Talk to your health care professional or pharmacist for more information. You should make sure that you get enough calcium and vitamin D while you are taking this medicine. Discuss the foods you eat and the vitamins you take with  your health care professional. Some people who take this medicine have severe bone, joint, and/or muscle pain. This medicine may also increase your risk for jaw problems or a broken thigh bone. Tell your doctor right away if you have severe pain in your jaw, bones, joints, or muscles. Tell your doctor if you have any pain that does not go away or that gets worse. Tell your dentist and dental surgeon that you are taking this medicine. You should not have major dental surgery while on this medicine. See your dentist to have a dental exam and fix any dental problems before starting this medicine. Take good care of your teeth while on this medicine. Make sure you see your dentist for regular follow-up appointments. What side effects may I notice from receiving this medicine? Side effects that you should report to your doctor or health care professional as soon as possible: -allergic reactions like skin rash, itching or hives, swelling of the face, lips, or tongue -anxiety, confusion, or depression -breathing problems -changes in vision -eye pain -feeling faint or lightheaded, falls -jaw pain, especially after dental work -mouth sores -muscle cramps, stiffness, or weakness -redness, blistering, peeling or loosening of the skin, including inside the mouth -trouble passing urine or change in the amount of urine Side effects that usually do not require medical attention (report to your doctor or health care professional if they continue or are bothersome): -bone, joint, or muscle pain -constipation -diarrhea -fever -hair loss -irritation at site where injected -loss of appetite -nausea, vomiting -stomach upset -trouble sleeping -trouble swallowing -weak or tired This list may not describe all possible side effects. Call your doctor for medical advice about side effects. You may report side effects to FDA at 1-800-FDA-1088. Where should I keep my medicine? This drug is given in a hospital or  clinic and will not be stored at home. NOTE: This sheet is a summary. It may not cover all possible information. If you have questions about this medicine, talk to your doctor, pharmacist, or health care provider.  2018 Elsevier/Gold Standard (2013-11-22 14:19:39)  

## 2017-04-17 ENCOUNTER — Telehealth: Payer: Self-pay

## 2017-04-17 ENCOUNTER — Other Ambulatory Visit: Payer: Self-pay

## 2017-04-17 NOTE — Progress Notes (Signed)
[facility] Follow-up Visit:  Assessment: Multiple myeloma not having achieved remission ([place]) 71 y.o. male with diagnosis of IgA lambda active multiple myeloma, ISS Stage I. Active disease diagnosed based on presence of anemia, kidney insufficiency, and paraproteinemia with significant predominance of lambda light chains as well as significant elevation of IgA. Bone marrow biopsy confirms presence of atypical monoclonal plasma cell process in the bone marrow comprising at least 7% of the cellularity.  Based on the findings, patient was started on treatment with lenalidomide, bortezomib, and low-dose dexamethasone based on the anticipated tolerance by the patient. Lenalidomide was started at 10 mg daily based on creatinine clearance of 42, dexamethasone dose was reduced to 20 mg weekly based on patient's age. Treatment was complicated by rapid development of cutaneous rash attributed to lenalidomide. Rash responded to topical steroids. Renal function also has decreased and now patient has persistent severe anemia also attributable to lenalidomide. With that in mind, I will continue holding lenalidomide at this time awaiting improvement in the anemia. Nevertheless, further delays in the systemic therapy are unlikely to be beneficial to the patient and bortezomib and dexamethasone will be resumed.  Plan: --Continue prophylactic acyclovir and low-dose Bactrim to reduce the risk of VZV reactivation and PCP infection respectively --He is receiving aspirin and that should service efficient thromboprophylaxis in the context of lower dose of thalidomide --Proceed with cycle #2 of therapy -- bortezomib + dexamethasone only, holding lenalidomide --RTC 1wk for lab work, toxicity monitoring.  Voice recognition software was used and creation of this note. Despite my best effort at editing the text, some misspelling/errors may have occurred.  Orders Placed This Encounter  Procedures  . CBC  with Differential    Standing Status:   Future    Standing Expiration Date:   04/13/2018  . Comprehensive metabolic panel    Standing Status:   Future    Standing Expiration Date:   04/13/2018  . CBC with Differential    Standing Status:   Future    Standing Expiration Date:   04/13/2018  . Comprehensive metabolic panel    Standing Status:   Future    Standing Expiration Date:   04/13/2018    Cancer Staging Multiple myeloma not having achieved remission ([place]) Staging form: Plasma Cell Myeloma and Plasma Cell Disorders, AJCC 8th Edition - Clinical stage from 03/21/2017: RISS Stage I (Beta-2-microglobulin (mg/L): 3.2, Albumin (g/dL): 3.6, ISS: Stage I, High-risk cytogenetics: Absent, LDH: Normal) - Signed by [name], MD on 04/04/2017 - Clinical: No stage assigned - Unsigned   All questions were answered.  . The patient knows to call the clinic with any problems, questions or concerns.  This note was electronically signed.    History of Presenting Illness Robert Sparks is a 71 y.o. male followed in the [facility] for new diagnosis of IgA lambda active multiple myeloma. At presentation, Robert Sparks reported having some night sweats, but no weight loss. He indicated weakness in bilateral thighs associated with the feet and legs. Complained of constipation, but denies any numbness or tingling in hands and feet. Denied any new musculoskeletal pain. No chest pain, shortness of breath or cough. No nausea, vomiting, abdominal pain, diarrhea, or early satiety. Additional evaluation was conducted resulting in negative imaging including negative skeletal survey on PET/CT. Bone marrow biopsy returned positive with 7% involvement with atypical monoclonal plasma cells. In conjunction with kidney injury, anemia, dysproteinemia with significant elevation of lambda light chains and IgA, this confirmed a  diagnosis of active multiple myeloma warranting intervention.   Patient was initiated on  systemic induction chemotherapy with lenalidomide, bortezomib, and dexamethasone. The cycle was complicated by development of cutaneous rash, grade 2 likely due to lenalidomide. Responded well to steroids administration topically. Further complicated by renal insufficiency worsening as well as progressive anemia. Patient denies any new symptoms since the last visit to the clinic. Patient continues losing weight.  Oncological/hematological History: --Labs, 10/28/15: WBC 6.0, Hgb 10.3, Plt 245;   --Labs, 10/02/16: WBC 5.1, Hgb 10.0, Plt 227; ANA -- negative --Labs, 11/10/16: tProt 6.3, Alb 3.4, Ca   9.1, Cr 2.56, AP 82; WBC 4.7, Hgb 8.1, Plt 201; Fe 43, FeSat 16%, TIBC 272, Ferritin 476, Vit B12 199, MMA 283; TSH 1.57 --Labs, 12/25/16: tProt 7.0, Alb 4.1, Ca   9.9, Cr 1.47, AP ...; SPEP -- M-spike 0.8g/dL; kappa 40.7, lambda 305.1, KLR 0.13; WBC 3.4, Hgb 13.2, Plt 154; --Labs, 02/09/17: tProt 8.0, Alb 3.9, Ca 10.1, Cr 1.80, AP 97; SPEP -- M-spike 0.7g.dL, IgA lambda; IgA 1220, IgG 666, IgM 52; kappa 33.5, lambda 433.5, KLR 0.08; WBC 6.3, Hgb 10.6, Plt 194 --Labs, 04/05/17: tProt 7.0, Alb 3.5, Ca   8.9, Cr 2.20, AP 75; SPEP -- M-spike 0.4g/dL, IgA lambda; IgA   433, IgG 683, IgM 52; kappa 59.7, lambda   42.7, KLR 1.40; WBC 5.0, Hgb   7.4, Plt 280;    Multiple myeloma not having achieved remission ([place])   02/16/2017 Imaging    Skeletal Survey: No definite lytic myelomatous lesions in the axial or appendicular skeleton      02/23/2017 Initial Diagnosis    Multiple myeloma not having achieved remission ([place])     03/09/2017 Imaging    PET-CT: No abnormal hypermetabolic activity or other specific findings of active myeloma. Several adjacent healing left lateral rib fractures.      03/13/2017 Pathology Results    Bone Marrow Bx: HYPERCELLULAR BONE MARROW FOR AGE WITH PLASMA CELL NEOPLASM. TRILINEAGE HEMATOPOIESIS. NORMOCYTIC-NORMOCHROMIA ANEMIA. EOSINOPHILIA. The bone marrow shows slight increase in  atypical plasma cells representing 7% of all cells associated with small clusters in the clot and biopsy sections. Immunohistochemical stains highlight the plasma cell component in the bone marrow which shows lambda light chain restriction consistent with plasma cell neoplasm. The background shows trilineage hematopoiesis with non-specific myeloid changes. The peripheral blood shows eosinophilia without an associated increase in eosinophils in the bone marrow. The changes may represent a hypersensitivity reaction. Cytogenetics/FISH: 46,XY -- Positive for +11, +17/17p+ -- extra CCND1 copy in 9% cells, gain of ATM copy in 9% & 11% cells with p53 gain.        03/16/2017 -  Chemotherapy    Induction chemotherapy: Lenalidomide 71m PO Qday, d1-14 + Bortezomib 1.333mm2 IV d1,4,8,11 + dexamethasone 2053mO QWk Q21d --Cycle #1, 03/17/66/61omplicated by grade 2 rash within 1 week of initiation, as well as a aggressive rising creatinine; d11 held due to worsening rash --Cycle #2, 04/13/17: Initiated without lenalidomide due to persistent Grade 3 anemia        Medical History: Past Medical History:  Diagnosis Date  . Anemia   . Arthritis    shoulders,feet   . Cataract    per eye dr -has appt 5-11  . CKD (chronic kidney disease), stage III [place] . Elevated PSA   . History of ketoacidosis    03-29-2015   . History of sepsis    07-25-2013  non-compliant w/ medication  . Hyperlipidemia   .  Hypertension   . Nocturia   . Peripheral neuropathy   . Prostate cancer ([place])   . Type 2 diabetes mellitus with insulin therapy [facility])     Surgical History: Past Surgical History:  Procedure Laterality Date  . CIRCUMCISION  1990's  . PROSTATE BIOPSY N/A 12/21/2015   Procedure: BIOPSY TRANSRECTAL ULTRASONIC PROSTATE (TUBP);  Surgeon: Festus Aloe, MD;  Location: [facility];  Service: Urology;  Laterality: N/A;    Family History: Family History  Problem Relation Age of Onset  .  Cancer Neg Hx   . Colon cancer Neg Hx   . Colon polyps Neg Hx   . Esophageal cancer Neg Hx   . Rectal cancer Neg Hx   . Stomach cancer Neg Hx     Social History: Social History   Social History  . Marital status: Single    Spouse name: N/A  . Number of children: N/A  . Years of education: N/A   Occupational History  . Not on file.   Social History Main Topics  . Smoking status: Current Every Day Smoker    Packs/day: 0.25    Years: 50.00    Types: Cigarettes  . Smokeless tobacco: Never Used     Comment: 1 ppwk-4-5 a day   . Alcohol use 0.6 oz/week    1 Cans of beer per week     Comment: 1 every day-quit couple weeks ago  . Drug use: No  . Sexual activity: Not Currently   Other Topics Concern  . Not on file   Social History Narrative  . No narrative on file    Allergies: No Known Allergies  Medications:  Current Outpatient Prescriptions  Medication Sig Dispense Refill  . ACCU-CHEK AVIVA PLUS test strip See admin instructions.  12  . acyclovir (ZOVIRAX) 400 MG tablet Take 1 tablet (400 mg total) by mouth 2 (two) times daily. 60 tablet 3  . amLODipine (NORVASC) 5 MG tablet Take 1 tablet (5 mg total) by mouth daily. 90 tablet 3  . Ascorbic Acid (VITAMIN C) 1000 MG tablet Take 1,000 mg by mouth daily.    [name] aspirin EC 81 MG tablet Take 1 tablet (81 mg total) by mouth daily. 90 tablet 3  . Cholecalciferol (VITAMIN D) 2000 units CAPS Take 2,000 Units by mouth daily.    [name] dexamethasone (DECADRON) 4 MG tablet TAKE 5 TABLETS BY MOUTH ONCE A WEEK. 15 tablet 0  . folic acid (FOLVITE) 1 MG tablet Take 1 tablet (1 mg total) by mouth daily. 60 tablet 0  . gabapentin (NEURONTIN) 300 MG capsule Take 1 capsule (300 mg total) by mouth 4 (four) times daily. Take 1 tab po am and lunch, 2 tabs at night. 120 capsule 2  . insulin aspart protamine - aspart (NOVOLOG 70/30 MIX) (70-30) 100 UNIT/ML FlexPen Inject 0.05 mLs (5 Units total) into the skin 2 (two) times daily with a meal. 6 mL 2   . lidocaine-prilocaine (EMLA) cream Apply to affected area once 30 g 3  . LORazepam (ATIVAN) 0.5 MG tablet Take 1 tablet (0.5 mg total) by mouth every 6 (six) hours as needed (Nausea or vomiting). 30 tablet 0  . mometasone (ELOCON) 0.1 % cream Apply 1 application topically daily. 45 g 3  . Multiple Vitamin (MULTIVITAMIN WITH MINERALS) TABS tablet Take 1 tablet by mouth daily. 60 tablet 2  . ondansetron (ZOFRAN) 8 MG tablet Take 1 tablet (8 mg total) by mouth 2 (two) times daily as needed (Nausea  or vomiting). 30 tablet 1  . potassium chloride (K-DUR) 10 MEQ tablet TAKE 1 TABLET BY MOUTH DAILY. 7 tablet 0  . pravastatin (PRAVACHOL) 20 MG tablet Take 1 tablet (20 mg total) by mouth daily. 90 tablet 3  . prochlorperazine (COMPAZINE) 10 MG tablet Take 1 tablet (10 mg total) by mouth every 6 (six) hours as needed (Nausea or vomiting). 30 tablet 1  . senna-docusate (SENOKOT-S) 8.6-50 MG tablet Take 2 tablets by mouth 2 (two) times daily. 60 tablet 2  . sulfamethoxazole-trimethoprim (BACTRIM) 400-80 MG tablet Take 1 tablet by mouth daily. 21 tablet 1  . thiamine 100 MG tablet Take 1 tablet (100 mg total) by mouth daily. 60 tablet 0  . ferrous sulfate 325 (65 FE) MG tablet Take 325 mg by mouth daily with breakfast.     Current Facility-Administered Medications  Medication Dose Route Frequency Provider Last Rate Last Dose  . 0.9 %  sodium chloride infusion  500 mL Intravenous Continuous Pyrtle, Lajuan Lines, MD        Review of Systems: Review of Systems  Constitutional: Positive for diaphoresis and fatigue. Negative for unexpected weight change.  HENT:  Negative.   Eyes: Negative.   Respiratory: Negative.   Cardiovascular: Negative.   Gastrointestinal: Positive for constipation.  Endocrine: Negative.   Genitourinary: Negative.    Skin: Positive for itching and rash.  Neurological: Positive for extremity weakness.  Hematological: Negative.   Psychiatric/Behavioral: Negative.      PHYSICAL  EXAMINATION Blood pressure (!) 141/74, pulse 76, temperature 98.7 F (37.1 C), temperature source Oral, resp. rate 18, height 5' 4" (1.626 m), weight 159 lb 8 oz (72.3 kg), SpO2 100 %.  ECOG PERFORMANCE STATUS: 2 - Symptomatic, <50% confined to bed  Physical Exam  Constitutional: He is oriented to person, place, and time and well-developed, well-nourished, and in no distress. No distress.  HENT:  Head: Normocephalic and atraumatic.  Patient still has some of his lower front teeth left. These appear to be in good health without evidence of infection. Dissemination of the oral cavity also reveals presence of a white patch over the posterior left lower gum without ulceration or bleeding. No other areas of abnormalities noted.  Eyes: Pupils are equal, round, and reactive to light. Conjunctivae and EOM are normal. No scleral icterus.  Neck: No thyromegaly present.  Cardiovascular: Normal rate and regular rhythm.   No murmur heard. Pulmonary/Chest: Effort normal and breath sounds normal. No respiratory distress. He has no wheezes.  Abdominal: Soft. He exhibits no distension and no mass. There is no tenderness.  Musculoskeletal: He exhibits no edema or tenderness.  Lymphadenopathy:    He has no cervical adenopathy.  Neurological: He is alert and oriented to person, place, and time. He has normal reflexes. No cranial nerve deficit.  Skin: Skin is warm and dry. No rash noted. He is not diaphoretic. No erythema.  Interval near complete resolution of the cutaneous rash.     LABORATORY DATA: I have personally reviewed the data as listed: Appointment on 04/13/2017  Component Date Value Ref Range Status  . WBC 04/13/2017 4.0  4.0 - 10.3 10e3/uL Final  . NEUT# 04/13/2017 2.4  1.5 - 6.5 10e3/uL Final  . HGB 04/13/2017 7.4* 13.0 - 17.1 g/dL Final  . HCT 04/13/2017 23.3* 38.4 - 49.9 % Final  . Platelets 04/13/2017 Clumped Platelets--Appears Adequate  140 - 400 10e3/uL Final  . MCV 04/13/2017 97.1   79.3 - 98.0 fL Final  . MCH 04/13/2017 30.8  27.2 - 33.4 pg Final  . MCHC 04/13/2017 31.8* 32.0 - 36.0 g/dL Final  . RBC 04/13/2017 2.40* 4.20 - 5.82 10e6/uL Final  . RDW 04/13/2017 14.9* 11.0 - 14.6 % Final  . lymph# 04/13/2017 0.9  0.9 - 3.3 10e3/uL Final  . MONO# 04/13/2017 0.5  0.1 - 0.9 10e3/uL Final  . Eosinophils Absolute 04/13/2017 0.1  0.0 - 0.5 10e3/uL Final  . Basophils Absolute 04/13/2017 0.1  0.0 - 0.1 10e3/uL Final  . NEUT% 04/13/2017 58.6  39.0 - 75.0 % Final  . LYMPH% 04/13/2017 21.7  14.0 - 49.0 % Final  . MONO% 04/13/2017 13.0  0.0 - 14.0 % Final  . EOS% 04/13/2017 3.5  0.0 - 7.0 % Final  . BASO% 04/13/2017 3.2* 0.0 - 2.0 % Final  . nRBC 04/13/2017 0  0 - 0 % Final  . Sodium 04/13/2017 140  136 - 145 mEq/L Final  . Potassium 04/13/2017 4.0  3.5 - 5.1 mEq/L Final  . Chloride 04/13/2017 111* 98 - 109 mEq/L Final  . CO2 04/13/2017 19* 22 - 29 mEq/L Final  . Glucose 04/13/2017 128  70 - 140 mg/dl Final   Glucose reference range is for nonfasting patients. Fasting glucose reference range is 70- 100.  [name] BUN 04/13/2017 25.1  7.0 - 26.0 mg/dL Final  . Creatinine 04/13/2017 2.1* 0.7 - 1.3 mg/dL Final  . Total Bilirubin 04/13/2017 0.42  0.20 - 1.20 mg/dL Final  . Alkaline Phosphatase 04/13/2017 60  40 - 150 U/L Final  . AST 04/13/2017 12  5 - 34 U/L Final  . ALT 04/13/2017 8  0 - 55 U/L Final  . Total Protein 04/13/2017 6.7  6.4 - 8.3 g/dL Final  . Albumin 04/13/2017 3.5  3.5 - 5.0 g/dL Final  . Calcium 04/13/2017 9.6  8.4 - 10.4 mg/dL Final  . Anion Gap 04/13/2017 10  3 - 11 mEq/L Final  . EGFR 04/13/2017 36* >90 ml/min/1.73 m2 Final   eGFR is calculated using the CKD-EPI Creatinine Equation (2009)  Office Visit on 04/12/2017  Component Date Value Ref Range Status  . POC Glucose 04/12/2017 115* 70 - 99 mg/dl Final  . Hemoglobin A1C 04/12/2017 7.3   Final       [name], MD

## 2017-04-17 NOTE — Assessment & Plan Note (Signed)
71 y.o. male with diagnosis of IgA lambda active multiple myeloma, ISS Stage I. Active disease diagnosed based on presence of anemia, kidney insufficiency, and paraproteinemia with significant predominance of lambda light chains as well as significant elevation of IgA. Bone marrow biopsy confirms presence of atypical monoclonal plasma cell process in the bone marrow comprising at least 7% of the cellularity.  Based on the findings, patient was started on treatment with lenalidomide, bortezomib, and low-dose dexamethasone based on the anticipated tolerance by the patient. Lenalidomide was started at 10 mg daily based on creatinine clearance of 42, dexamethasone dose was reduced to 20 mg weekly based on patient's age. Treatment was complicated by rapid development of cutaneous rash attributed to lenalidomide. Rash responded to topical steroids. Renal function also has decreased and now patient has persistent severe anemia also attributable to lenalidomide. With that in mind, I will continue holding lenalidomide at this time awaiting improvement in the anemia. Nevertheless, further delays in the systemic therapy are unlikely to be beneficial to the patient and bortezomib and dexamethasone will be resumed.  Plan: --Continue prophylactic acyclovir and low-dose Bactrim to reduce the risk of VZV reactivation and PCP infection respectively --He is receiving aspirin and that should service efficient thromboprophylaxis in the context of lower dose of thalidomide --Proceed with cycle #2 of therapy -- bortezomib + dexamethasone only, holding lenalidomide --RTC 1wk for lab work, toxicity monitoring.

## 2017-04-17 NOTE — Telephone Encounter (Signed)
Revlimid on hold at this time. Pt to visit doctor with labs on 04/20/17 for re-evaluation of anemia. When doctor ready to restart revlimid he will resume at a lowered dose. When ordering, not to pharmacist should include "unit dosed with dexamethasone compounded capsule" per [place], [facility]. Pt aware at this time to hold revlimid.

## 2017-04-20 ENCOUNTER — Ambulatory Visit (HOSPITAL_BASED_OUTPATIENT_CLINIC_OR_DEPARTMENT_OTHER): Payer: Medicare HMO

## 2017-04-20 ENCOUNTER — Other Ambulatory Visit: Payer: Self-pay | Admitting: Hematology and Oncology

## 2017-04-20 ENCOUNTER — Other Ambulatory Visit (HOSPITAL_BASED_OUTPATIENT_CLINIC_OR_DEPARTMENT_OTHER): Payer: Medicare HMO

## 2017-04-20 VITALS — BP 150/82 | HR 73 | Temp 98.5°F | Resp 18

## 2017-04-20 DIAGNOSIS — C9000 Multiple myeloma not having achieved remission: Secondary | ICD-10-CM | POA: Diagnosis not present

## 2017-04-20 DIAGNOSIS — Z5112 Encounter for antineoplastic immunotherapy: Secondary | ICD-10-CM | POA: Diagnosis not present

## 2017-04-20 LAB — COMPREHENSIVE METABOLIC PANEL
ALT: 8 U/L (ref 0–55)
ANION GAP: 10 meq/L (ref 3–11)
AST: 10 U/L (ref 5–34)
Albumin: 3.7 g/dL (ref 3.5–5.0)
Alkaline Phosphatase: 99 U/L (ref 40–150)
BILIRUBIN TOTAL: 0.37 mg/dL (ref 0.20–1.20)
BUN: 29.6 mg/dL — ABNORMAL HIGH (ref 7.0–26.0)
CO2: 20 meq/L — AB (ref 22–29)
CREATININE: 2 mg/dL — AB (ref 0.7–1.3)
Calcium: 9.1 mg/dL (ref 8.4–10.4)
Chloride: 106 mEq/L (ref 98–109)
EGFR: 38 mL/min/{1.73_m2} — ABNORMAL LOW (ref >60)
GLUCOSE: 144 mg/dL — AB (ref 70–140)
Potassium: 3.6 mEq/L (ref 3.5–5.1)
SODIUM: 136 meq/L (ref 136–145)
Total Protein: 7 g/dL (ref 6.4–8.3)

## 2017-04-20 LAB — CBC WITH DIFFERENTIAL/PLATELET
BASO%: 0.6 % (ref 0.0–2.0)
Basophils Absolute: 0 10*3/uL (ref 0.0–0.1)
EOS%: 3.9 % (ref 0.0–7.0)
Eosinophils Absolute: 0.2 10*3/uL (ref 0.0–0.5)
HCT: 26 % — ABNORMAL LOW (ref 38.4–49.9)
HGB: 8.4 g/dL — ABNORMAL LOW (ref 13.0–17.1)
LYMPH%: 17.9 % (ref 14.0–49.0)
MCH: 31.6 pg (ref 27.2–33.4)
MCHC: 32.3 g/dL (ref 32.0–36.0)
MCV: 97.7 fL (ref 79.3–98.0)
MONO#: 0.3 10*3/uL (ref 0.1–0.9)
MONO%: 6.3 % (ref 0.0–14.0)
NEUT%: 71.3 % (ref 39.0–75.0)
NEUTROS ABS: 3.6 10*3/uL (ref 1.5–6.5)
PLATELETS: 250 10*3/uL (ref 140–400)
RBC: 2.66 10*6/uL — AB (ref 4.20–5.82)
RDW: 15.4 % — AB (ref 11.0–14.6)
WBC: 5.1 10*3/uL (ref 4.0–10.3)
lymph#: 0.9 10*3/uL (ref 0.9–3.3)

## 2017-04-20 MED ORDER — PROCHLORPERAZINE MALEATE 10 MG PO TABS
ORAL_TABLET | ORAL | Status: AC
  Filled 2017-04-20: qty 1

## 2017-04-20 MED ORDER — BORTEZOMIB CHEMO SQ INJECTION 3.5 MG (2.5MG/ML)
1.3000 mg/m2 | Freq: Once | INTRAMUSCULAR | Status: AC
  Administered 2017-04-20: 2.25 mg via SUBCUTANEOUS
  Filled 2017-04-20: qty 2.25

## 2017-04-20 MED ORDER — PROCHLORPERAZINE MALEATE 10 MG PO TABS
10.0000 mg | ORAL_TABLET | Freq: Once | ORAL | Status: AC
  Administered 2017-04-20: 10 mg via ORAL

## 2017-04-20 NOTE — Patient Instructions (Signed)
[facility] Discharge Instructions for Patients Receiving Chemotherapy  Today you received the following chemotherapy agents Velcade To help prevent nausea and vomiting after your treatment, we encourage you to take your nausea medication as prescribed.   If you develop nausea and vomiting that is not controlled by your nausea medication, call the clinic.   BELOW ARE SYMPTOMS THAT SHOULD BE REPORTED IMMEDIATELY:  *FEVER GREATER THAN 100.5 F  *CHILLS WITH OR WITHOUT FEVER  NAUSEA AND VOMITING THAT IS NOT CONTROLLED WITH YOUR NAUSEA MEDICATION  *UNUSUAL SHORTNESS OF BREATH  *UNUSUAL BRUISING OR BLEEDING  TENDERNESS IN MOUTH AND THROAT WITH OR WITHOUT PRESENCE OF ULCERS  *URINARY PROBLEMS  *BOWEL PROBLEMS  UNUSUAL RASH Items with * indicate a potential emergency and should be followed up as soon as possible.  Feel free to call the clinic should you have any questions or concerns. The clinic phone number is [phone].  Please show the CHEMO ALERT CARD at check-in to the Emergency Department and triage nurse.   

## 2017-04-23 DIAGNOSIS — N5201 Erectile dysfunction due to arterial insufficiency: Secondary | ICD-10-CM | POA: Diagnosis not present

## 2017-04-23 DIAGNOSIS — Z5111 Encounter for antineoplastic chemotherapy: Secondary | ICD-10-CM | POA: Diagnosis not present

## 2017-04-23 DIAGNOSIS — C61 Malignant neoplasm of prostate: Secondary | ICD-10-CM | POA: Diagnosis not present

## 2017-04-24 ENCOUNTER — Ambulatory Visit (INDEPENDENT_AMBULATORY_CARE_PROVIDER_SITE_OTHER): Payer: Medicare HMO | Admitting: Orthotics

## 2017-04-24 DIAGNOSIS — L608 Other nail disorders: Secondary | ICD-10-CM

## 2017-04-24 DIAGNOSIS — M7750 Other enthesopathy of unspecified foot: Secondary | ICD-10-CM

## 2017-04-24 DIAGNOSIS — L84 Corns and callosities: Secondary | ICD-10-CM

## 2017-04-24 DIAGNOSIS — E0842 Diabetes mellitus due to underlying condition with diabetic polyneuropathy: Secondary | ICD-10-CM | POA: Diagnosis not present

## 2017-04-24 DIAGNOSIS — M19079 Primary osteoarthritis, unspecified ankle and foot: Secondary | ICD-10-CM

## 2017-04-24 MED FILL — ACCU-CHEK AVIVA PLUS TEST S: 30 days supply | Qty: 100 | Fill #10

## 2017-04-27 ENCOUNTER — Ambulatory Visit (HOSPITAL_BASED_OUTPATIENT_CLINIC_OR_DEPARTMENT_OTHER): Payer: Medicare HMO | Admitting: Hematology and Oncology

## 2017-04-27 ENCOUNTER — Telehealth: Payer: Self-pay | Admitting: Hematology and Oncology

## 2017-04-27 ENCOUNTER — Ambulatory Visit (HOSPITAL_BASED_OUTPATIENT_CLINIC_OR_DEPARTMENT_OTHER): Payer: Medicare HMO

## 2017-04-27 ENCOUNTER — Encounter: Payer: Self-pay | Admitting: Hematology and Oncology

## 2017-04-27 ENCOUNTER — Other Ambulatory Visit (HOSPITAL_BASED_OUTPATIENT_CLINIC_OR_DEPARTMENT_OTHER): Payer: Medicare HMO

## 2017-04-27 VITALS — BP 131/66 | HR 80 | Temp 98.0°F | Resp 18 | Ht 64.0 in | Wt 159.3 lb

## 2017-04-27 DIAGNOSIS — C9000 Multiple myeloma not having achieved remission: Secondary | ICD-10-CM

## 2017-04-27 DIAGNOSIS — Z5112 Encounter for antineoplastic immunotherapy: Secondary | ICD-10-CM

## 2017-04-27 DIAGNOSIS — Z5111 Encounter for antineoplastic chemotherapy: Secondary | ICD-10-CM

## 2017-04-27 LAB — COMPREHENSIVE METABOLIC PANEL
ALBUMIN: 3.4 g/dL — AB (ref 3.5–5.0)
ALK PHOS: 141 U/L (ref 40–150)
ALT: 11 U/L (ref 0–55)
ANION GAP: 10 meq/L (ref 3–11)
AST: 15 U/L (ref 5–34)
BILIRUBIN TOTAL: 0.38 mg/dL (ref 0.20–1.20)
BUN: 37.2 mg/dL — ABNORMAL HIGH (ref 7.0–26.0)
CO2: 22 mEq/L (ref 22–29)
Calcium: 8.9 mg/dL (ref 8.4–10.4)
Chloride: 105 mEq/L (ref 98–109)
Creatinine: 2 mg/dL — ABNORMAL HIGH (ref 0.7–1.3)
EGFR: 38 mL/min/{1.73_m2} — AB (ref >60)
Glucose: 154 mg/dl — ABNORMAL HIGH (ref 70–140)
POTASSIUM: 3.5 meq/L (ref 3.5–5.1)
SODIUM: 138 meq/L (ref 136–145)
TOTAL PROTEIN: 6.6 g/dL (ref 6.4–8.3)

## 2017-04-27 LAB — CBC WITH DIFFERENTIAL/PLATELET
BASO%: 0.4 % (ref 0.0–2.0)
Basophils Absolute: 0 10*3/uL (ref 0.0–0.1)
EOS%: 15.5 % — ABNORMAL HIGH (ref 0.0–7.0)
Eosinophils Absolute: 0.9 10*3/uL — ABNORMAL HIGH (ref 0.0–0.5)
HCT: 25.4 % — ABNORMAL LOW (ref 38.4–49.9)
HGB: 8.2 g/dL — ABNORMAL LOW (ref 13.0–17.1)
LYMPH%: 13.4 % — ABNORMAL LOW (ref 14.0–49.0)
MCH: 31.7 pg (ref 27.2–33.4)
MCHC: 32.3 g/dL (ref 32.0–36.0)
MCV: 98.1 fL — AB (ref 79.3–98.0)
MONO#: 0.5 10*3/uL (ref 0.1–0.9)
MONO%: 9.7 % (ref 0.0–14.0)
NEUT%: 61 % (ref 39.0–75.0)
NEUTROS ABS: 3.4 10*3/uL (ref 1.5–6.5)
Platelets: 214 10*3/uL (ref 140–400)
RBC: 2.59 10*6/uL — ABNORMAL LOW (ref 4.20–5.82)
RDW: 15.4 % — ABNORMAL HIGH (ref 11.0–14.6)
WBC: 5.5 10*3/uL (ref 4.0–10.3)
lymph#: 0.7 10*3/uL — ABNORMAL LOW (ref 0.9–3.3)

## 2017-04-27 MED ORDER — PROCHLORPERAZINE MALEATE 10 MG PO TABS
10.0000 mg | ORAL_TABLET | Freq: Once | ORAL | Status: AC
  Administered 2017-04-27: 10 mg via ORAL

## 2017-04-27 MED ORDER — PROCHLORPERAZINE MALEATE 10 MG PO TABS
ORAL_TABLET | ORAL | Status: AC
  Filled 2017-04-27: qty 1

## 2017-04-27 MED ORDER — BORTEZOMIB CHEMO SQ INJECTION 3.5 MG (2.5MG/ML)
1.3000 mg/m2 | Freq: Once | INTRAMUSCULAR | Status: AC
  Administered 2017-04-27: 2.25 mg via SUBCUTANEOUS
  Filled 2017-04-27: qty 2.25

## 2017-04-27 NOTE — Telephone Encounter (Signed)
Gave avs and calendar for November  °

## 2017-04-27 NOTE — Progress Notes (Signed)
Per Dr. Lebron Conners, okay to treat pt with crt of 2.0.

## 2017-04-27 NOTE — Patient Instructions (Signed)
[facility] Discharge Instructions for Patients Receiving Chemotherapy  Today you received the following chemotherapy agents Velcade.  To help prevent nausea and vomiting after your treatment, we encourage you to take your nausea medication as directed.  If you develop nausea and vomiting that is not controlled by your nausea medication, call the clinic.   BELOW ARE SYMPTOMS THAT SHOULD BE REPORTED IMMEDIATELY:  *FEVER GREATER THAN 100.5 F  *CHILLS WITH OR WITHOUT FEVER  NAUSEA AND VOMITING THAT IS NOT CONTROLLED WITH YOUR NAUSEA MEDICATION  *UNUSUAL SHORTNESS OF BREATH  *UNUSUAL BRUISING OR BLEEDING  TENDERNESS IN MOUTH AND THROAT WITH OR WITHOUT PRESENCE OF ULCERS  *URINARY PROBLEMS  *BOWEL PROBLEMS  UNUSUAL RASH Items with * indicate a potential emergency and should be followed up as soon as possible.  Feel free to call the clinic should you have any questions or concerns. The clinic phone number is [phone].  Please show the CHEMO ALERT CARD at check-in to the Emergency Department and triage nurse.   

## 2017-04-30 ENCOUNTER — Ambulatory Visit: Payer: Medicare HMO

## 2017-04-30 ENCOUNTER — Other Ambulatory Visit: Payer: Self-pay | Admitting: Hematology and Oncology

## 2017-04-30 DIAGNOSIS — C9000 Multiple myeloma not having achieved remission: Secondary | ICD-10-CM

## 2017-04-30 MED ORDER — PROCHLORPERAZINE MALEATE 10 MG PO TABS
ORAL_TABLET | ORAL | Status: AC
  Filled 2017-04-30: qty 1

## 2017-04-30 MED FILL — POTASSIUM CL 10 MEQ TAB SA: 10 | 7 days supply | Qty: 7 | Fill #0

## 2017-04-30 NOTE — Progress Notes (Signed)

## 2017-05-01 MED FILL — DEXAMETHASONE 4 MG TABLET: 4 | 21 days supply | Qty: 15 | Fill #0

## 2017-05-02 ENCOUNTER — Encounter: Payer: Self-pay | Admitting: Podiatry

## 2017-05-02 ENCOUNTER — Ambulatory Visit (INDEPENDENT_AMBULATORY_CARE_PROVIDER_SITE_OTHER): Payer: Medicare HMO | Admitting: Podiatry

## 2017-05-02 DIAGNOSIS — B351 Tinea unguium: Secondary | ICD-10-CM | POA: Diagnosis not present

## 2017-05-02 DIAGNOSIS — E0842 Diabetes mellitus due to underlying condition with diabetic polyneuropathy: Secondary | ICD-10-CM | POA: Diagnosis not present

## 2017-05-02 DIAGNOSIS — L84 Corns and callosities: Secondary | ICD-10-CM | POA: Diagnosis not present

## 2017-05-02 DIAGNOSIS — M79676 Pain in unspecified toe(s): Secondary | ICD-10-CM

## 2017-05-03 NOTE — Progress Notes (Signed)
[facility] Follow-up Visit:  Assessment: Multiple myeloma not having achieved remission (Robert Sparks) 71 y.o. male with diagnosis of IgA lambda active multiple myeloma, ISS Stage I. Active disease diagnosed based on presence of anemia, kidney insufficiency, and paraproteinemia with significant predominance of lambda light chains as well as significant elevation of IgA. Bone marrow biopsy confirms presence of atypical monoclonal plasma cell process in the bone marrow comprising at least 7% of the cellularity.  Based on the findings, patient was started on treatment with lenalidomide, bortezomib, and low-dose dexamethasone based on the anticipated tolerance by the patient. Lenalidomide was started at 10 mg daily based on creatinine clearance of 42, dexamethasone dose was reduced to 20 mg weekly based on patient's age. Treatment was complicated by rapid development of cutaneous rash attributed to lenalidomide. Rash responded to topical steroids. Renal function also has decreased and now patient has persistent severe anemia also attributable to lenalidomide. With that in mind, I will continue holding lenalidomide at this time awaiting improvement in the anemia. Nevertheless, further delays in the systemic therapy are unlikely to be beneficial to the patient and bortezomib and dexamethasone will be resumed.  Plan: --Continue prophylactic acyclovir and low-dose Bactrim to reduce the risk of VZV reactivation and PCP infection respectively --He is receiving aspirin and that should service efficient thromboprophylaxis in the context of lower dose of thalidomide --Continue cycle #2 of therapy -- bortezomib + dexamethasone only, holding lenalidomide --For the 3rd cycle of therapy we may consider adjusting the treatment to continue weakly without interruption and change cycles to 4-week intervals --Disease assessment with bone marrow biopsy following completion of the third cycle of systemic  therapy --RTC 2wk for lab work, toxicity monitoring and possible initiation of Cycle #3.  Voice recognition software was used and creation of this note. Despite my best effort at editing the text, some misspelling/errors may have occurred.  Orders Placed This Encounter  Procedures  . CBC with Differential    Standing Status:   Future    Standing Expiration Date:   04/27/2018  . Lactate dehydrogenase (LDH)    Standing Status:   Future    Standing Expiration Date:   04/27/2018  . Comprehensive metabolic panel    Standing Status:   Future    Standing Expiration Date:   04/27/2018  . Beta 2 microglobulin, serum    Standing Status:   Future    Standing Expiration Date:   04/27/2018  . Multiple Myeloma Panel (SPEP&IFE w/QIG)    Standing Status:   Future    Standing Expiration Date:   04/27/2018  . Kappa/lambda light chains    Standing Status:   Future    Standing Expiration Date:   04/27/2018  . 24-Hr Ur UPEP/UIFE/Light Chains/TP    Standing Status:   Future    Standing Expiration Date:   04/27/2018    Cancer Staging Multiple myeloma not having achieved remission (Robert Sparks) Staging form: Plasma Cell Myeloma and Plasma Cell Disorders, AJCC 8th Edition - Clinical stage from 03/21/2017: RISS Stage I (Beta-2-microglobulin (mg/L): 3.2, Albumin (g/dL): 3.6, ISS: Stage I, High-risk cytogenetics: Absent, LDH: Normal) - Signed by Robert Sax, MD on 04/04/2017 - Clinical: No stage assigned - Unsigned   All questions were answered.  . The patient knows to call the clinic with any problems, questions or concerns.  This note was electronically signed.    History of Presenting Illness Robert Sparks is a 71 y.o. male followed in the [place] for new  diagnosis of IgA lambda active multiple myeloma. At presentation, [name] reported having some night sweats, but no weight loss. He indicated weakness in bilateral thighs associated with the feet and legs. Complained of constipation, but  denies any numbness or tingling in hands and feet. Denied any new musculoskeletal pain. No chest pain, shortness of breath or cough. No nausea, vomiting, abdominal pain, diarrhea, or early satiety. Additional evaluation was conducted resulting in negative imaging including negative skeletal survey on PET/CT. Bone marrow biopsy returned positive with 7% involvement with atypical monoclonal plasma cells. In conjunction with kidney injury, anemia, dysproteinemia with significant elevation of lambda light chains and IgA, this confirmed a diagnosis of active multiple myeloma warranting intervention.   Patient was initiated on systemic induction chemotherapy with lenalidomide, bortezomib, and dexamethasone. The cycle was complicated by development of cutaneous rash, grade 2 likely due to lenalidomide. Responded well to steroids administration topically. Further complicated by renal insufficiency worsening as well as progressive anemia. Currently, patient receiving bortezomib and dexamethasone only and appears to be feeling significantly better. Patient denies any new symptoms since the last visit to the clinic.   Oncological/hematological History: --Labs, 10/28/15: WBC 6.0, Hgb 10.3, Plt 245;   --Labs, 10/02/16: WBC 5.1, Hgb 10.0, Plt 227; ANA -- negative --Labs, 11/10/16: tProt 6.3, Alb 3.4, Ca   9.1, Cr 2.56, AP 82; WBC 4.7, Hgb 8.1, Plt 201; Fe 43, FeSat 16%, TIBC 272, Ferritin 476, Vit B12 199, MMA 283; TSH 1.57 --Labs, 12/25/16: tProt 7.0, Alb 4.1, Ca   9.9, Cr 1.47, AP ...; SPEP -- M-spike 0.8g/dL; kappa 40.7, lambda 305.1, KLR 0.13; WBC 3.4, Hgb 13.2, Plt 154; --Labs, 02/09/17: tProt 8.0, Alb 3.9, Ca 10.1, Cr 1.80, AP 97; SPEP -- M-spike 0.7g.dL, IgA lambda; IgA 1220, IgG 666, IgM 52; kappa 33.5, lambda 433.5, KLR 0.08; WBC 6.3, Hgb 10.6, Plt 194 --Labs, 04/05/17: tProt 7.0, Alb 3.5, Ca   8.9, Cr 2.20, AP 75; SPEP -- M-spike 0.4g/dL, IgA lambda; IgA   433, IgG 683, IgM 52; kappa 59.7, lambda   42.7, KLR  1.40; WBC 5.0, Hgb   7.4, Plt 280;    Multiple myeloma not having achieved remission ([place])   02/16/2017 Imaging    Skeletal Survey: No definite lytic myelomatous lesions in the axial or appendicular skeleton      02/23/2017 Initial Diagnosis    Multiple myeloma not having achieved remission ([place])     03/09/2017 Imaging    PET-CT: No abnormal hypermetabolic activity or other specific findings of active myeloma. Several adjacent healing left lateral rib fractures.      03/13/2017 Pathology Results    Bone Marrow Bx: HYPERCELLULAR BONE MARROW FOR AGE WITH PLASMA CELL NEOPLASM. TRILINEAGE HEMATOPOIESIS. NORMOCYTIC-NORMOCHROMIA ANEMIA. EOSINOPHILIA. The bone marrow shows slight increase in atypical plasma cells representing 7% of all cells associated with small clusters in the clot and biopsy sections. Immunohistochemical stains highlight the plasma cell component in the bone marrow which shows lambda light chain restriction consistent with plasma cell neoplasm. The background shows trilineage hematopoiesis with non-specific myeloid changes. The peripheral blood shows eosinophilia without an associated increase in eosinophils in the bone marrow. The changes may represent a hypersensitivity reaction. Cytogenetics/FISH: 46,XY -- Positive for +11, +17/17p+ -- extra CCND1 copy in 9% cells, gain of ATM copy in 9% & 11% cells with p53 gain.        03/16/2017 -  Chemotherapy    Induction chemotherapy: Lenalidomide 27m PO Qday, d1-14 + Bortezomib 1.369mm2 IV d1,4,8,11 + dexamethasone  62m PO QWk Q21d --Cycle #1, 084/66/59 Complicated by grade 2 rash within 1 week of initiation, as well as a aggressive rising creatinine; d11 held due to worsening rash --Cycle #2, 04/13/17: Initiated without lenalidomide due to persistent Grade 3 anemia        Medical History: Past Medical History:  Diagnosis Date  . Anemia   . Arthritis    shoulders,feet   . Cataract    per eye dr -has appt 5-11  . CKD (chronic  kidney disease), stage III ([place]   . Elevated PSA   . History of ketoacidosis    03-29-2015   . History of sepsis    07-25-2013  non-compliant w/ medication  . Hyperlipidemia   . Hypertension   . Nocturia   . Peripheral neuropathy   . Prostate cancer ([place]   . Type 2 diabetes mellitus with insulin therapy ([facility]     Surgical History: Past Surgical History:  Procedure Laterality Date  . CIRCUMCISION  1990's  . PROSTATE BIOPSY N/A 12/21/2015   Procedure: BIOPSY TRANSRECTAL ULTRASONIC PROSTATE (TUBP);  Surgeon: MFestus Aloe MD;  [facility]  Service: Urology;  Laterality: N/A;    Family History: Family History  Problem Relation Age of Onset  . Cancer Neg Hx   . Colon cancer Neg Hx   . Colon polyps Neg Hx   . Esophageal cancer Neg Hx   . Rectal cancer Neg Hx   . Stomach cancer Neg Hx     Social History: Social History   Social History  . Marital status: Single    Spouse name: N/A  . Number of children: N/A  . Years of education: N/A   Occupational History  . Not on file.   Social History Main Topics  . Smoking status: Current Every Day Smoker    Packs/day: 0.25    Years: 50.00    Types: Cigarettes  . Smokeless tobacco: Never Used     Comment: 1 ppwk-4-5 a day   . Alcohol use 0.6 oz/week    1 Cans of beer per week     Comment: 1 every day-quit couple weeks ago  . Drug use: No  . Sexual activity: Not Currently   Other Topics Concern  . Not on file   Social History Narrative  . No narrative on file    Allergies: No Known Allergies  Medications:  Current Outpatient Prescriptions  Medication Sig Dispense Refill  . ACCU-CHEK AVIVA PLUS test strip See admin instructions.  12  . acyclovir (ZOVIRAX) 400 MG tablet Take 1 tablet (400 mg total) by mouth 2 (two) times daily. 60 tablet 3  . amLODipine (NORVASC) 5 MG tablet Take 1 tablet (5 mg total) by mouth daily. 90 tablet 3  . Ascorbic Acid (VITAMIN C) 1000 MG tablet Take 1,000  mg by mouth daily.    .Marland Kitchenaspirin EC 81 MG tablet Take 1 tablet (81 mg total) by mouth daily. 90 tablet 3  . Cholecalciferol (VITAMIN D) 2000 units CAPS Take 2,000 Units by mouth daily.    . ferrous sulfate 325 (65 FE) MG tablet Take 325 mg by mouth daily with breakfast.    . folic acid (FOLVITE) 1 MG tablet Take 1 tablet (1 mg total) by mouth daily. 60 tablet 0  . gabapentin (NEURONTIN) 300 MG capsule Take 1 capsule (300 mg total) by mouth 4 (four) times daily. Take 1 tab po am and lunch, 2 tabs at night. 120 capsule 2  .  insulin aspart protamine - aspart (NOVOLOG 70/30 MIX) (70-30) 100 UNIT/ML FlexPen Inject 0.05 mLs (5 Units total) into the skin 2 (two) times daily with a meal. 6 mL 2  . lidocaine-prilocaine (EMLA) cream Apply to affected area once 30 g 3  . LORazepam (ATIVAN) 0.5 MG tablet Take 1 tablet (0.5 mg total) by mouth every 6 (six) hours as needed (Nausea or vomiting). 30 tablet 0  . mometasone (ELOCON) 0.1 % cream Apply 1 application topically daily. 45 g 3  . Multiple Vitamin (MULTIVITAMIN WITH MINERALS) TABS tablet Take 1 tablet by mouth daily. 60 tablet 2  . ondansetron (ZOFRAN) 8 MG tablet Take 1 tablet (8 mg total) by mouth 2 (two) times daily as needed (Nausea or vomiting). 30 tablet 1  . pravastatin (PRAVACHOL) 20 MG tablet Take 1 tablet (20 mg total) by mouth daily. 90 tablet 3  . prochlorperazine (COMPAZINE) 10 MG tablet Take 1 tablet (10 mg total) by mouth every 6 (six) hours as needed (Nausea or vomiting). 30 tablet 1  . senna-docusate (SENOKOT-S) 8.6-50 MG tablet Take 2 tablets by mouth 2 (two) times daily. 60 tablet 2  . sulfamethoxazole-trimethoprim (BACTRIM) 400-80 MG tablet Take 1 tablet by mouth daily. 21 tablet 1  . thiamine 100 MG tablet Take 1 tablet (100 mg total) by mouth daily. 60 tablet 0  . dexamethasone (DECADRON) 4 MG tablet TAKE 5 TABLETS BY MOUTH ONCE A WEEK. 15 tablet 0  . potassium chloride (K-DUR) 10 MEQ tablet TAKE 1 TABLET BY MOUTH DAILY. 7 tablet 0    Current Facility-Administered Medications  Medication Dose Route Frequency Provider Last Rate Last Dose  . 0.9 %  sodium chloride infusion  500 mL Intravenous Continuous Pyrtle, Lajuan Lines, MD        Review of Systems: Review of Systems  Constitutional: Positive for diaphoresis and fatigue. Negative for unexpected weight change.  HENT:  Negative.   Eyes: Negative.   Respiratory: Negative.   Cardiovascular: Negative.   Gastrointestinal: Positive for constipation.  Endocrine: Negative.   Genitourinary: Negative.    Skin: Negative for itching and rash.  Neurological: Positive for extremity weakness.  Hematological: Negative.   Psychiatric/Behavioral: Negative.      PHYSICAL EXAMINATION Blood pressure 131/66, pulse 80, temperature 98 F (36.7 C), temperature source Oral, resp. rate 18, height 5' 4" (1.626 m), weight 159 lb 4.8 oz (72.3 kg), SpO2 100 %.  ECOG PERFORMANCE STATUS: 2 - Symptomatic, <50% confined to bed  Physical Exam  Constitutional: He is oriented to person, place, and time and well-developed, well-nourished, and in no distress. No distress.  HENT:  Head: Normocephalic and atraumatic.  Patient still has some of his lower front teeth left. These appear to be in good health without evidence of infection. Dissemination of the oral cavity also reveals presence of a white patch over the posterior left lower gum without ulceration or bleeding. No other areas of abnormalities noted.  Eyes: Pupils are equal, round, and reactive to light. Conjunctivae and EOM are normal. No scleral icterus.  Neck: No thyromegaly present.  Cardiovascular: Normal rate and regular rhythm.   No murmur heard. Pulmonary/Chest: Effort normal and breath sounds normal. No respiratory distress. He has no wheezes.  Abdominal: Soft. He exhibits no distension and no mass. There is no tenderness.  Musculoskeletal: He exhibits no edema or tenderness.  Lymphadenopathy:    He has no cervical adenopathy.   Neurological: He is alert and oriented to person, place, and time. He has normal reflexes.  No cranial nerve deficit.  Skin: Skin is warm and dry. No rash noted. He is not diaphoretic. No erythema.  Interval near complete resolution of the cutaneous rash.     LABORATORY DATA: I have personally reviewed the data as listed: Appointment on 04/27/2017  Component Date Value Ref Range Status  . WBC 04/27/2017 5.5  4.0 - 10.3 10e3/uL Final  . NEUT# 04/27/2017 3.4  1.5 - 6.5 10e3/uL Final  . HGB 04/27/2017 8.2* 13.0 - 17.1 g/dL Final  . HCT 04/27/2017 25.4* 38.4 - 49.9 % Final  . Platelets 04/27/2017 214  140 - 400 10e3/uL Final  . MCV 04/27/2017 98.1* 79.3 - 98.0 fL Final  . MCH 04/27/2017 31.7  27.2 - 33.4 pg Final  . MCHC 04/27/2017 32.3  32.0 - 36.0 g/dL Final  . RBC 04/27/2017 2.59* 4.20 - 5.82 10e6/uL Final  . RDW 04/27/2017 15.4* 11.0 - 14.6 % Final  . lymph# 04/27/2017 0.7* 0.9 - 3.3 10e3/uL Final  . MONO# 04/27/2017 0.5  0.1 - 0.9 10e3/uL Final  . Eosinophils Absolute 04/27/2017 0.9* 0.0 - 0.5 10e3/uL Final  . Basophils Absolute 04/27/2017 0.0  0.0 - 0.1 10e3/uL Final  . NEUT% 04/27/2017 61.0  39.0 - 75.0 % Final  . LYMPH% 04/27/2017 13.4* 14.0 - 49.0 % Final  . MONO% 04/27/2017 9.7  0.0 - 14.0 % Final  . EOS% 04/27/2017 15.5* 0.0 - 7.0 % Final  . BASO% 04/27/2017 0.4  0.0 - 2.0 % Final  . Sodium 04/27/2017 138  136 - 145 mEq/L Final  . Potassium 04/27/2017 3.5  3.5 - 5.1 mEq/L Final  . Chloride 04/27/2017 105  98 - 109 mEq/L Final  . CO2 04/27/2017 22  22 - 29 mEq/L Final  . Glucose 04/27/2017 154* 70 - 140 mg/dl Final   Glucose reference range is for nonfasting patients. Fasting glucose reference range is 70- 100.  [name] BUN 04/27/2017 37.2* 7.0 - 26.0 mg/dL Final  . Creatinine 04/27/2017 2.0* 0.7 - 1.3 mg/dL Final  . Total Bilirubin 04/27/2017 0.38  0.20 - 1.20 mg/dL Final  . Alkaline Phosphatase 04/27/2017 141  40 - 150 U/L Final  . AST 04/27/2017 15  5 - 34 U/L Final  . ALT  04/27/2017 11  0 - 55 U/L Final  . Total Protein 04/27/2017 6.6  6.4 - 8.3 g/dL Final  . Albumin 04/27/2017 3.4* 3.5 - 5.0 g/dL Final  . Calcium 04/27/2017 8.9  8.4 - 10.4 mg/dL Final  . Anion Gap 04/27/2017 10  3 - 11 mEq/L Final  . EGFR 04/27/2017 38* >60 ml/min/1.73 m2 Final   eGFR is calculated using the CKD-EPI Creatinine Equation (2009)       Robert Sax, MD

## 2017-05-03 NOTE — Assessment & Plan Note (Addendum)
71 y.o. male with diagnosis of IgA lambda active multiple myeloma, ISS Stage I. Active disease diagnosed based on presence of anemia, kidney insufficiency, and paraproteinemia with significant predominance of lambda light chains as well as significant elevation of IgA. Bone marrow biopsy confirms presence of atypical monoclonal plasma cell process in the bone marrow comprising at least 7% of the cellularity.  Based on the findings, patient was started on treatment with lenalidomide, bortezomib, and low-dose dexamethasone based on the anticipated tolerance by the patient. Lenalidomide was started at 10 mg daily based on creatinine clearance of 42, dexamethasone dose was reduced to 20 mg weekly based on patient's age. Treatment was complicated by rapid development of cutaneous rash attributed to lenalidomide. Rash responded to topical steroids. Renal function also has decreased and now patient has persistent severe anemia also attributable to lenalidomide. With that in mind, I will continue holding lenalidomide at this time awaiting improvement in the anemia. Nevertheless, further delays in the systemic therapy are unlikely to be beneficial to the patient and bortezomib and dexamethasone will be resumed.  Plan: --Continue prophylactic acyclovir and low-dose Bactrim to reduce the risk of VZV reactivation and PCP infection respectively --He is receiving aspirin and that should service efficient thromboprophylaxis in the context of lower dose of thalidomide --Continue cycle #2 of therapy -- bortezomib + dexamethasone only, holding lenalidomide --For the 3rd cycle of therapy we may consider adjusting the treatment to continue weakly without interruption and change cycles to 4-week intervals --Disease assessment with bone marrow biopsy following completion of the third cycle of systemic therapy --RTC 2wk for lab work, toxicity monitoring and possible initiation of Cycle #3.

## 2017-05-05 NOTE — Progress Notes (Signed)
    Subjective: Patient is a 71 y.o. male presenting to the office today with a chief complaint of painful callus lesions to the bilateral feet.  Patient also complains of elongated, thickened nails that cause pain while ambulating in shoes. Patient is unable to trim their own nails. Patient presents today for further treatment and evaluation.   Past Medical History:  Diagnosis Date  . Anemia   . Arthritis    shoulders,feet   . Cataract    per eye dr -has appt 5-11  . CKD (chronic kidney disease), stage III ([place])   . Elevated PSA   . History of ketoacidosis    03-29-2015   . History of sepsis    07-25-2013  non-compliant w/ medication  . Hyperlipidemia   . Hypertension   . Nocturia   . Peripheral neuropathy   . Prostate cancer ([place])   . Type 2 diabetes mellitus with insulin therapy ([facility])     Objective:  Physical Exam General: Alert and oriented x3 in no acute distress  Dermatology: Hyperkeratotic lesions present on the bilateral feet x 2. Pain on palpation with a central nucleated core noted. Skin is warm, dry and supple bilateral lower extremities. Negative for open lesions or macerations. Nails are tender, long, thickened and dystrophic with subungual debris, consistent with onychomycosis, 1-5 bilateral. No signs of infection noted.  Vascular: Palpable pedal pulses bilaterally. No edema or erythema noted. Capillary refill within normal limits.  Neurological: Epicritic and protective threshold grossly intact bilaterally.   Musculoskeletal Exam: Pain on palpation at the keratotic lesion noted. Range of motion within normal limits bilateral. Muscle strength 5/5 in all groups bilateral.  Assessment: 1. Onychodystrophic nails 1-5 bilateral with hyperkeratosis of nails.  2. Onychomycosis of nail due to dermatophyte bilateral 3. Pre-ulcerative calluses to the bilateral feet 2   Plan of Care:  #1 Patient evaluated. #2 Excisional debridement of keratoic lesion using a chisel  blade was performed without incident.  #3 Dressed with light dressing. #4 Mechanical debridement of nails 1-5 bilaterally performed using a nail nipper. Filed with dremel without incident.  #5 Patient is to return to the clinic in 3 months.   Edrick Kins, DPM [facility]  Dr. Edrick Kins, [address]                                        [place], [place] [address]                Office [phone]  Fax [phone]

## 2017-05-07 ENCOUNTER — Other Ambulatory Visit: Payer: Self-pay | Admitting: Hematology and Oncology

## 2017-05-07 DIAGNOSIS — C9000 Multiple myeloma not having achieved remission: Secondary | ICD-10-CM

## 2017-05-07 MED FILL — PRAVASTATIN NA 20 MG TAB: 20 | 30 days supply | Qty: 30 | Fill #4

## 2017-05-07 MED FILL — GABAPENTIN 300 MG CAPSULE: 300 | 30 days supply | Qty: 120 | Fill #2

## 2017-05-07 MED FILL — POTASSIUM CL 10 MEQ TAB SA: 10 | 7 days supply | Qty: 7 | Fill #0

## 2017-05-07 MED FILL — AMLODIPINE BESYLATE 5 MG TA: 5 | 30 days supply | Qty: 30 | Fill #4

## 2017-05-08 ENCOUNTER — Other Ambulatory Visit (HOSPITAL_BASED_OUTPATIENT_CLINIC_OR_DEPARTMENT_OTHER): Payer: Medicare HMO

## 2017-05-08 ENCOUNTER — Ambulatory Visit: Payer: Medicare HMO

## 2017-05-08 DIAGNOSIS — C9000 Multiple myeloma not having achieved remission: Secondary | ICD-10-CM | POA: Diagnosis not present

## 2017-05-08 LAB — COMPREHENSIVE METABOLIC PANEL
ALT: 8 U/L (ref 0–55)
AST: 10 U/L (ref 5–34)
Albumin: 3.6 g/dL (ref 3.5–5.0)
Alkaline Phosphatase: 68 U/L (ref 40–150)
Anion Gap: 11 mEq/L (ref 3–11)
BUN: 21.9 mg/dL (ref 7.0–26.0)
CHLORIDE: 107 meq/L (ref 98–109)
CO2: 20 meq/L — AB (ref 22–29)
Calcium: 9.2 mg/dL (ref 8.4–10.4)
Creatinine: 1.8 mg/dL — ABNORMAL HIGH (ref 0.7–1.3)
EGFR: 43 mL/min/{1.73_m2} — ABNORMAL LOW (ref >60)
GLUCOSE: 146 mg/dL — AB (ref 70–140)
POTASSIUM: 3.6 meq/L (ref 3.5–5.1)
SODIUM: 137 meq/L (ref 136–145)
Total Bilirubin: 0.53 mg/dL (ref 0.20–1.20)
Total Protein: 6.9 g/dL (ref 6.4–8.3)

## 2017-05-08 LAB — CBC WITH DIFFERENTIAL/PLATELET
BASO%: 0.1 % (ref 0.0–2.0)
Basophils Absolute: 0 10*3/uL (ref 0.0–0.1)
EOS%: 3.6 % (ref 0.0–7.0)
Eosinophils Absolute: 0.3 10*3/uL (ref 0.0–0.5)
HCT: 26 % — ABNORMAL LOW (ref 38.4–49.9)
HGB: 8.5 g/dL — ABNORMAL LOW (ref 13.0–17.1)
LYMPH%: 4.5 % — AB (ref 14.0–49.0)
MCH: 32 pg (ref 27.2–33.4)
MCHC: 32.7 g/dL (ref 32.0–36.0)
MCV: 97.7 fL (ref 79.3–98.0)
MONO#: 0.1 10*3/uL (ref 0.1–0.9)
MONO%: 1.4 % (ref 0.0–14.0)
NEUT#: 6.3 10*3/uL (ref 1.5–6.5)
NEUT%: 90.4 % — AB (ref 39.0–75.0)
Platelets: 221 10*3/uL (ref 140–400)
RBC: 2.66 10*6/uL — AB (ref 4.20–5.82)
RDW: 15.2 % — AB (ref 11.0–14.6)
WBC: 6.9 10*3/uL (ref 4.0–10.3)
lymph#: 0.3 10*3/uL — ABNORMAL LOW (ref 0.9–3.3)

## 2017-05-08 LAB — LACTATE DEHYDROGENASE: LDH: 145 U/L (ref 125–245)

## 2017-05-08 NOTE — Progress Notes (Signed)
Pt complaint of cold like symptoms - runny nose, cough, and sore throat.  Per Dr. Lebron Conners, hold treatment this week to allow pt to recover from cold and have pt return next week for treatment.  Pt educated on this and understands.

## 2017-05-09 LAB — KAPPA/LAMBDA LIGHT CHAINS
IG KAPPA FREE LIGHT CHAIN: 46 mg/L — AB (ref 3.3–19.4)
IG LAMBDA FREE LIGHT CHAIN: 21.3 mg/L (ref 5.7–26.3)
Kappa/Lambda FluidC Ratio: 2.16 — ABNORMAL HIGH (ref 0.26–1.65)

## 2017-05-09 LAB — BETA 2 MICROGLOBULIN, SERUM: BETA 2: 4 mg/L — AB (ref 0.6–2.4)

## 2017-05-09 MED FILL — NOVOLOG MIX 70-30 FLEXPEN S: (70-30) 100 | 30 days supply | Qty: 3 | Fill #1

## 2017-05-10 LAB — MULTIPLE MYELOMA PANEL, SERUM
ALBUMIN SERPL ELPH-MCNC: 3.5 g/dL (ref 2.9–4.4)
ALPHA 1: 0.2 g/dL (ref 0.0–0.4)
Albumin/Glob SerPl: 1.3 (ref 0.7–1.7)
Alpha2 Glob SerPl Elph-Mcnc: 0.9 g/dL (ref 0.4–1.0)
B-Globulin SerPl Elph-Mcnc: 1 g/dL (ref 0.7–1.3)
GAMMA GLOB SERPL ELPH-MCNC: 0.7 g/dL (ref 0.4–1.8)
GLOBULIN, TOTAL: 2.8 g/dL (ref 2.2–3.9)
IGA/IMMUNOGLOBULIN A, SERUM: 177 mg/dL (ref 61–437)
IgG, Qn, Serum: 734 mg/dL (ref 700–1600)
IgM, Qn, Serum: 54 mg/dL (ref 20–172)
M PROTEIN SERPL ELPH-MCNC: 0.2 g/dL — AB
Total Protein: 6.3 g/dL (ref 6.0–8.5)

## 2017-05-11 ENCOUNTER — Ambulatory Visit: Payer: Medicare HMO

## 2017-05-11 ENCOUNTER — Other Ambulatory Visit: Payer: Self-pay | Admitting: *Deleted

## 2017-05-11 ENCOUNTER — Telehealth: Payer: Self-pay | Admitting: Hematology and Oncology

## 2017-05-11 ENCOUNTER — Ambulatory Visit (HOSPITAL_BASED_OUTPATIENT_CLINIC_OR_DEPARTMENT_OTHER): Payer: Medicare HMO | Admitting: Hematology and Oncology

## 2017-05-11 ENCOUNTER — Encounter: Payer: Self-pay | Admitting: Hematology and Oncology

## 2017-05-11 VITALS — BP 95/52 | HR 78 | Temp 98.4°F | Resp 18 | Ht 64.0 in | Wt 154.7 lb

## 2017-05-11 DIAGNOSIS — C9000 Multiple myeloma not having achieved remission: Secondary | ICD-10-CM | POA: Diagnosis not present

## 2017-05-11 DIAGNOSIS — J069 Acute upper respiratory infection, unspecified: Secondary | ICD-10-CM

## 2017-05-11 MED ORDER — DEXAMETHASONE 4 MG PO TABS: ORAL_TABLET | ORAL | 0 refills | Status: DC

## 2017-05-11 NOTE — Patient Instructions (Signed)
Thank you for choosing [facility] to provide your oncology and hematology care.  To afford each patient quality time with our providers, please arrive 30 minutes before your scheduled appointment time.  If you arrive late for your appointment, you may be asked to reschedule.  We strive to give you quality time with our providers, and arriving late affects you and other patients whose appointments are after yours.   If you are a no show for multiple scheduled visits, you may be dismissed from the clinic at the providers discretion.    Again, thank you for choosing [facility], our hope is that these requests will decrease the amount of time that you wait before being seen by our physicians.  ______________________________________________________________________  Should you have questions after your visit to the [facility], please contact our office at [phone] between the hours of 8:30 and 4:30 p.m.    Voicemails left after 4:30p.m will not be returned until the following business day.    For prescription refill requests, please have your pharmacy contact us directly.  Please also try to allow 48 hours for prescription requests.    Please contact the scheduling department for questions regarding scheduling.  For scheduling of procedures such as PET scans, CT scans, MRI, Ultrasound, etc please contact central scheduling at [phone].    Resources For Cancer Patients and Caregivers:   Oncolink.org:  A wonderful resource for patients and healthcare providers for information regarding your disease, ways to tract your treatment, what to expect, etc.     American Cancer Society:  800-227-2345  Can help patients locate various types of support and financial assistance  Cancer Care: 1-800-813-HOPE (4673) Provides financial assistance, online support groups, medication/co-pay assistance.    [place] DSS:  336-641-3447 Where to apply for food  stamps, Medicaid, and utility assistance  Medicare Rights Center: 800-333-4114 Helps people with Medicare understand their rights and benefits, navigate the Medicare system, and secure the quality healthcare they deserve  SCAT: 336-333-6589 [employer] [employer]'s shared-ride transportation service for eligible riders who have a disability that prevents them from riding the fixed route bus.    For additional information on assistance programs please contact our social worker:   Grier Hock/Abigail Elmore:  [phone]            

## 2017-05-11 NOTE — Telephone Encounter (Signed)
Gave avs and calendar for November however waiting on an email from Emily regarding next week

## 2017-05-14 ENCOUNTER — Telehealth: Payer: Self-pay | Admitting: Hematology and Oncology

## 2017-05-14 NOTE — Telephone Encounter (Signed)
Called patient regarding current schedule °

## 2017-05-18 ENCOUNTER — Ambulatory Visit (HOSPITAL_BASED_OUTPATIENT_CLINIC_OR_DEPARTMENT_OTHER): Payer: Medicare HMO

## 2017-05-18 ENCOUNTER — Other Ambulatory Visit: Payer: Self-pay | Admitting: Hematology and Oncology

## 2017-05-18 VITALS — BP 124/68 | HR 77 | Temp 98.4°F | Resp 18

## 2017-05-18 DIAGNOSIS — C9000 Multiple myeloma not having achieved remission: Secondary | ICD-10-CM | POA: Diagnosis not present

## 2017-05-18 DIAGNOSIS — Z5112 Encounter for antineoplastic immunotherapy: Secondary | ICD-10-CM

## 2017-05-18 MED ORDER — BORTEZOMIB CHEMO SQ INJECTION 3.5 MG (2.5MG/ML)
1.3000 mg/m2 | Freq: Once | INTRAMUSCULAR | Status: AC
  Administered 2017-05-18: 2.25 mg via SUBCUTANEOUS
  Filled 2017-05-18: qty 2.25

## 2017-05-18 MED ORDER — PROCHLORPERAZINE MALEATE 10 MG PO TABS
ORAL_TABLET | ORAL | Status: AC
  Filled 2017-05-18: qty 1

## 2017-05-18 MED ORDER — PROCHLORPERAZINE MALEATE 10 MG PO TABS
10.0000 mg | ORAL_TABLET | Freq: Once | ORAL | Status: AC
  Administered 2017-05-18: 10 mg via ORAL

## 2017-05-18 NOTE — Progress Notes (Signed)
Per Dr. Lebron Conners, okay to treat pt with labs from 05/08/17

## 2017-05-18 NOTE — Patient Instructions (Signed)
[facility] Discharge Instructions for Patients Receiving Chemotherapy  Today you received the following chemotherapy agents Velcade To help prevent nausea and vomiting after your treatment, we encourage you to take your nausea medication as prescribed.   If you develop nausea and vomiting that is not controlled by your nausea medication, call the clinic.   BELOW ARE SYMPTOMS THAT SHOULD BE REPORTED IMMEDIATELY:  *FEVER GREATER THAN 100.5 F  *CHILLS WITH OR WITHOUT FEVER  NAUSEA AND VOMITING THAT IS NOT CONTROLLED WITH YOUR NAUSEA MEDICATION  *UNUSUAL SHORTNESS OF BREATH  *UNUSUAL BRUISING OR BLEEDING  TENDERNESS IN MOUTH AND THROAT WITH OR WITHOUT PRESENCE OF ULCERS  *URINARY PROBLEMS  *BOWEL PROBLEMS  UNUSUAL RASH Items with * indicate a potential emergency and should be followed up as soon as possible.  Feel free to call the clinic should you have any questions or concerns. The clinic phone number is [phone].  Please show the CHEMO ALERT CARD at check-in to the Emergency Department and triage nurse.   

## 2017-05-21 LAB — UPEP/UIFE/LIGHT CHAINS/TP, 24-HR UR
% BETA, Urine: 18.6 %
ALBUMIN, U: 51.6 %
ALPHA 1 URINE: 4.9 %
ALPHA-2-GLOBULIN, U: 11.9 %
Free Kappa Lt Chains,Ur: 34.2 mg/L — ABNORMAL HIGH (ref 1.35–24.19)
Free Lambda Lt Chains,Ur: 5.61 mg/L (ref 0.24–6.66)
GAMMA GLOBULIN URINE: 13 %
KAPPA/LAMBDA RATIO, U: 6.1 (ref 2.04–10.37)
PROTEIN UR: 10.1 mg/dL
Prot,24hr calculated: 273 mg/24 hr — ABNORMAL HIGH (ref 30–150)

## 2017-05-21 MED FILL — DEXAMETHASONE 4 MG TABLET: 4 | 21 days supply | Qty: 15 | Fill #0

## 2017-05-22 ENCOUNTER — Other Ambulatory Visit (HOSPITAL_BASED_OUTPATIENT_CLINIC_OR_DEPARTMENT_OTHER): Payer: Medicare HMO

## 2017-05-22 DIAGNOSIS — C9000 Multiple myeloma not having achieved remission: Secondary | ICD-10-CM

## 2017-05-22 LAB — COMPREHENSIVE METABOLIC PANEL
ALBUMIN: 3.7 g/dL (ref 3.5–5.0)
ALT: 14 U/L (ref 0–55)
ANION GAP: 9 meq/L (ref 3–11)
AST: 14 U/L (ref 5–34)
Alkaline Phosphatase: 87 U/L (ref 40–150)
BUN: 23.4 mg/dL (ref 7.0–26.0)
CALCIUM: 9.2 mg/dL (ref 8.4–10.4)
CO2: 22 mEq/L (ref 22–29)
CREATININE: 1.8 mg/dL — AB (ref 0.7–1.3)
Chloride: 106 mEq/L (ref 98–109)
EGFR: 43 mL/min/{1.73_m2} — ABNORMAL LOW (ref >60)
Glucose: 177 mg/dl — ABNORMAL HIGH (ref 70–140)
Potassium: 3.6 mEq/L (ref 3.5–5.1)
Sodium: 137 mEq/L (ref 136–145)
TOTAL PROTEIN: 6.8 g/dL (ref 6.4–8.3)
Total Bilirubin: 0.39 mg/dL (ref 0.20–1.20)

## 2017-05-22 LAB — CBC WITH DIFFERENTIAL/PLATELET
BASO%: 0.6 % (ref 0.0–2.0)
Basophils Absolute: 0 10*3/uL (ref 0.0–0.1)
EOS%: 8 % — AB (ref 0.0–7.0)
Eosinophils Absolute: 0.3 10*3/uL (ref 0.0–0.5)
HEMATOCRIT: 26.9 % — AB (ref 38.4–49.9)
HEMOGLOBIN: 8.9 g/dL — AB (ref 13.0–17.1)
LYMPH%: 19.6 % (ref 14.0–49.0)
MCH: 32.2 pg (ref 27.2–33.4)
MCHC: 32.9 g/dL (ref 32.0–36.0)
MCV: 97.7 fL (ref 79.3–98.0)
MONO#: 0.4 10*3/uL (ref 0.1–0.9)
MONO%: 9.7 % (ref 0.0–14.0)
NEUT#: 2.4 10*3/uL (ref 1.5–6.5)
NEUT%: 62.1 % (ref 39.0–75.0)
Platelets: 223 10*3/uL (ref 140–400)
RBC: 2.76 10*6/uL — ABNORMAL LOW (ref 4.20–5.82)
RDW: 16.5 % — AB (ref 11.0–14.6)
WBC: 3.8 10*3/uL — AB (ref 4.0–10.3)
lymph#: 0.8 10*3/uL — ABNORMAL LOW (ref 0.9–3.3)

## 2017-05-22 LAB — MAGNESIUM: Magnesium: 2.9 mg/dl — ABNORMAL HIGH (ref 1.5–2.5)

## 2017-05-22 NOTE — Progress Notes (Signed)
[facility] Follow-up Visit:  Assessment: Multiple myeloma not having achieved remission ([place]) 71 y.o. male with diagnosis of IgA lambda active multiple myeloma, ISS Stage I. Active disease diagnosed based on presence of anemia, kidney insufficiency, and paraproteinemia with significant predominance of lambda light chains as well as significant elevation of IgA. Bone marrow biopsy confirms presence of atypical monoclonal plasma cell process in the bone marrow comprising at least 7% of the cellularity.  Based on the findings, patient was started on treatment with lenalidomide, bortezomib, and low-dose dexamethasone based on the anticipated tolerance by the patient. Lenalidomide was started at 10 mg daily based on creatinine clearance of 42, dexamethasone dose was reduced to 20 mg weekly based on patient's age. Treatment was complicated by rapid development of cutaneous rash attributed to lenalidomide. Rash responded to topical steroids. Renal function also has decreased and now patient has persistent severe anemia also attributable to lenalidomide. Due to the severity of complications, lenalidomide was removed.  Currently, treatment is on hold due to upper respiratory tract infection consistent with a viral illness. Patient is afebrile, he is not neutropenic. He is hemodynamically stable and does not require admission to the hospital.  Plan: --Continue prophylactic acyclovir and low-dose Bactrim to reduce the risk of VZV reactivation and PCP infection respectively --He is receiving aspirin and that should service efficient thromboprophylaxis in the context of lower dose of thalidomide --hold systemic therapy until next week --will resume therapy next week if patient has recovered --For the 3rd cycle of therapy we may consider adjusting the treatment to continue weakly without interruption and change cycles to 4-week intervals --Disease assessment with bone marrow biopsy following  completion of the third cycle of systemic therapy --RTC 2wk for lab work, toxicity monitoring and possible initiation of Cycle #3.  Voice recognition software was used and creation of this note. Despite my best effort at editing the text, some misspelling/errors may have occurred.  Orders Placed This Encounter  Procedures  . CBC with Differential    Standing Status:   Future    Number of Occurrences:   1    Standing Expiration Date:   05/11/2018  . Comprehensive metabolic panel    Standing Status:   Future    Number of Occurrences:   1    Standing Expiration Date:   05/11/2018  . Magnesium    Standing Status:   Future    Number of Occurrences:   1    Standing Expiration Date:   05/11/2018  . Phosphorus    Standing Status:   Future    Number of Occurrences:   1    Standing Expiration Date:   05/11/2018    Cancer Staging Multiple myeloma not having achieved remission ([place]) Staging form: Plasma Cell Myeloma and Plasma Cell Disorders, AJCC 8th Edition - Clinical stage from 03/21/2017: RISS Stage I (Beta-2-microglobulin (mg/L): 3.2, Albumin (g/dL): 3.6, ISS: Stage I, High-risk cytogenetics: Absent, LDH: Normal) - Signed by [name], MD on 04/04/2017 - Clinical: No stage assigned - Unsigned   All questions were answered.  . The patient knows to call the clinic with any problems, questions or concerns.  This note was electronically signed.    History of Presenting Illness Contrell Ballentine Enderle is a 71 y.o. male followed in the [place] for new diagnosis of IgA lambda active multiple myeloma. At presentation, Mr. Petersheim reported having some night sweats, but no weight loss. He indicated weakness in bilateral thighs associated with the  feet and legs. Complained of constipation, but denies any numbness or tingling in hands and feet. Denied any new musculoskeletal pain. No chest pain, shortness of breath or cough. No nausea, vomiting, abdominal pain, diarrhea, or early satiety.  Additional evaluation was conducted resulting in negative imaging including negative skeletal survey on PET/CT. Bone marrow biopsy returned positive with 7% involvement with atypical monoclonal plasma cells. In conjunction with kidney injury, anemia, dysproteinemia with significant elevation of lambda light chains and IgA, this confirmed a diagnosis of active multiple myeloma warranting intervention.   Patient presents to the clinic while receiving systemic therapy with combinational bortezomib and dexamethasone. Earlier this week, we held his treatment due to patient developing upper respiratory tract infection consistent with viral illness without significant fever. Patient reports getting better in terms of reducing cough, no active shortness of breath. Sore throat and postnasal drip are also getting better.   Oncological/hematological History: --Labs, 10/28/15: WBC 6.0, Hgb 10.3, Plt 245;   --Labs, 10/02/16: WBC 5.1, Hgb 10.0, Plt 227; ANA -- negative --Labs, 11/10/16: tProt 6.3, Alb 3.4, Ca   9.1, Cr 2.56, AP 82; WBC 4.7, Hgb 8.1, Plt 201; Fe 43, FeSat 16%, TIBC 272, Ferritin 476, Vit B12 199, MMA 283; TSH 1.57 --Labs, 12/25/16: tProt 7.0, Alb 4.1, Ca   9.9, Cr 1.47, AP ...; SPEP -- M-spike 0.8g/dL; kappa 40.7, lambda 305.1, KLR 0.13; WBC 3.4, Hgb 13.2, Plt 154; --Labs, 02/09/17: tProt 8.0, Alb 3.9, Ca 10.1, Cr 1.80, AP 97; SPEP -- M-spike 0.7g.dL, IgA lambda; IgA 1220, IgG 666, IgM 52; kappa 33.5, lambda 433.5, KLR 0.08; WBC 6.3, Hgb 10.6, Plt 194 --Labs, 04/05/17: tProt 7.0, Alb 3.5, Ca   8.9, Cr 2.20, AP 75; SPEP -- M-spike 0.4g/dL, IgA lambda; IgA   433, IgG 683, IgM 52; kappa 59.7, lambda   42.7, KLR 1.40; WBC 5.0, Hgb   7.4, Plt 280;    Multiple myeloma not having achieved remission (HCC)   02/16/2017 Imaging    Skeletal Survey: No definite lytic myelomatous lesions in the axial or appendicular skeleton      02/23/2017 Initial Diagnosis    Multiple myeloma not having achieved remission  (HCC)      03/09/2017 Imaging    PET-CT: No abnormal hypermetabolic activity or other specific findings of active myeloma. Several adjacent healing left lateral rib fractures.      03/13/2017 Pathology Results    Bone Marrow Bx: HYPERCELLULAR BONE MARROW FOR AGE WITH PLASMA CELL NEOPLASM. TRILINEAGE HEMATOPOIESIS. NORMOCYTIC-NORMOCHROMIA ANEMIA. EOSINOPHILIA. The bone marrow shows slight increase in atypical plasma cells representing 7% of all cells associated with small clusters in the clot and biopsy sections. Immunohistochemical stains highlight the plasma cell component in the bone marrow which shows lambda light chain restriction consistent with plasma cell neoplasm. The background shows trilineage hematopoiesis with non-specific myeloid changes. The peripheral blood shows eosinophilia without an associated increase in eosinophils in the bone marrow. The changes may represent a hypersensitivity reaction. Cytogenetics/FISH: 46,XY -- Positive for +11, +17/17p+ -- extra CCND1 copy in 9% cells, gain of ATM copy in 9% & 11% cells with p53 gain.        03/16/2017 -  Chemotherapy    Induction chemotherapy: Lenalidomide 10mg PO Qday, d1-14 + Bortezomib 1.3mg/m2 IV d1,4,8,11 + dexamethasone 20mg PO QWk Q21d --Cycle #1, 03/16/17: Complicated by grade 2 rash within 1 week of initiation, as well as a aggressive rising creatinine; d11 held due to worsening rash --Cycle #2, 04/13/17: Initiated without lenalidomide due to persistent   Grade 3 anemia        Medical History: Past Medical History:  Diagnosis Date  . Anemia   . Arthritis    shoulders,feet   . Cataract    per eye dr -has appt 5-11  . CKD (chronic kidney disease), stage III (HCC)   . Elevated PSA   . History of ketoacidosis    03-29-2015   . History of sepsis    07-25-2013  non-compliant w/ medication  . Hyperlipidemia   . Hypertension   . Nocturia   . Peripheral neuropathy   . Prostate cancer (HCC)   . Type 2 diabetes mellitus  with insulin therapy (HCC)     Surgical History: Past Surgical History:  Procedure Laterality Date  . CIRCUMCISION  1990's    Family History: Family History  Problem Relation Age of Onset  . Cancer Neg Hx   . Colon cancer Neg Hx   . Colon polyps Neg Hx   . Esophageal cancer Neg Hx   . Rectal cancer Neg Hx   . Stomach cancer Neg Hx     Social History: Social History   Socioeconomic History  . Marital status: Single    Spouse name: Not on file  . Number of children: Not on file  . Years of education: Not on file  . Highest education level: Not on file  Social Needs  . Financial resource strain: Not on file  . Food insecurity - worry: Not on file  . Food insecurity - inability: Not on file  . Transportation needs - medical: Not on file  . Transportation needs - non-medical: Not on file  Occupational History  . Not on file  Tobacco Use  . Smoking status: Current Every Day Smoker    Packs/day: 0.25    Years: 50.00    Pack years: 12.50    Types: Cigarettes  . Smokeless tobacco: Never Used  . Tobacco comment: 1 ppwk-4-5 a day   Substance and Sexual Activity  . Alcohol use: Yes    Alcohol/week: 0.6 oz    Types: 1 Cans of beer per week    Comment: 1 every day-quit couple weeks ago  . Drug use: No  . Sexual activity: Not Currently  Other Topics Concern  . Not on file  Social History Narrative  . Not on file    Allergies: No Known Allergies  Medications:  Current Outpatient Medications  Medication Sig Dispense Refill  . ACCU-CHEK AVIVA PLUS test strip See admin instructions.  12  . acyclovir (ZOVIRAX) 400 MG tablet Take 1 tablet (400 mg total) by mouth 2 (two) times daily. 60 tablet 3  . amLODipine (NORVASC) 5 MG tablet Take 1 tablet (5 mg total) by mouth daily. 90 tablet 3  . Ascorbic Acid (VITAMIN C) 1000 MG tablet Take 1,000 mg by mouth daily.    . aspirin EC 81 MG tablet Take 1 tablet (81 mg total) by mouth daily. 90 tablet 3  . Cholecalciferol (VITAMIN  D) 2000 units CAPS Take 2,000 Units by mouth daily.    . ferrous sulfate 325 (65 FE) MG tablet Take 325 mg by mouth daily with breakfast.    . folic acid (FOLVITE) 1 MG tablet Take 1 tablet (1 mg total) by mouth daily. 60 tablet 0  . gabapentin (NEURONTIN) 300 MG capsule Take 1 capsule (300 mg total) by mouth 4 (four) times daily. Take 1 tab po am and lunch, 2 tabs at night. 120 capsule 2  . insulin   aspart protamine - aspart (NOVOLOG 70/30 MIX) (70-30) 100 UNIT/ML FlexPen Inject 0.05 mLs (5 Units total) into the skin 2 (two) times daily with a meal. 6 mL 2  . lidocaine-prilocaine (EMLA) cream Apply to affected area once 30 g 3  . LORazepam (ATIVAN) 0.5 MG tablet Take 1 tablet (0.5 mg total) by mouth every 6 (six) hours as needed (Nausea or vomiting). 30 tablet 0  . mometasone (ELOCON) 0.1 % cream Apply 1 application topically daily. 45 g 3  . Multiple Vitamin (MULTIVITAMIN WITH MINERALS) TABS tablet Take 1 tablet by mouth daily. 60 tablet 2  . ondansetron (ZOFRAN) 8 MG tablet Take 1 tablet (8 mg total) by mouth 2 (two) times daily as needed (Nausea or vomiting). 30 tablet 1  . potassium chloride (K-DUR) 10 MEQ tablet TAKE 1 TABLET BY MOUTH DAILY. 7 tablet 0  . pravastatin (PRAVACHOL) 20 MG tablet Take 1 tablet (20 mg total) by mouth daily. 90 tablet 3  . prochlorperazine (COMPAZINE) 10 MG tablet Take 1 tablet (10 mg total) by mouth every 6 (six) hours as needed (Nausea or vomiting). 30 tablet 1  . senna-docusate (SENOKOT-S) 8.6-50 MG tablet Take 2 tablets by mouth 2 (two) times daily. 60 tablet 2  . sulfamethoxazole-trimethoprim (BACTRIM) 400-80 MG tablet Take 1 tablet by mouth daily. 21 tablet 1  . thiamine 100 MG tablet Take 1 tablet (100 mg total) by mouth daily. 60 tablet 0  . dexamethasone (DECADRON) 4 MG tablet TAKE 5 TABLETS BY MOUTH ONCE A WEEK. 15 tablet 0   Current Facility-Administered Medications  Medication Dose Route Frequency Provider Last Rate Last Dose  . 0.9 %  sodium chloride  infusion  500 mL Intravenous Continuous Pyrtle, Jay M, MD        Review of Systems: Review of Systems  Constitutional: Positive for fatigue. Negative for diaphoresis and unexpected weight change.  HENT:   Positive for sore throat. Negative for trouble swallowing and voice change.   Eyes: Negative.   Respiratory: Positive for cough.   Cardiovascular: Negative.   Gastrointestinal: Positive for constipation.  Endocrine: Negative.   Genitourinary: Negative.    Skin: Negative for itching and rash.  Neurological: Positive for extremity weakness.  Hematological: Negative.   Psychiatric/Behavioral: Negative.      PHYSICAL EXAMINATION Blood pressure (!) 95/52, pulse 78, temperature 98.4 F (36.9 C), temperature source Oral, resp. rate 18, height 5' 4" (1.626 m), weight 154 lb 11.2 oz (70.2 kg), SpO2 100 %.  ECOG PERFORMANCE STATUS: 2 - Symptomatic, <50% confined to bed  Physical Exam  Constitutional: He is oriented to person, place, and time and well-developed, well-nourished, and in no distress. No distress.  HENT:  Head: Normocephalic and atraumatic.  Patient still has some of his lower front teeth left. These appear to be in good health without evidence of infection. Dissemination of the oral cavity also reveals presence of a white patch over the posterior left lower gum without ulceration or bleeding. No other areas of abnormalities noted.  Eyes: Conjunctivae and EOM are normal. Pupils are equal, round, and reactive to light. No scleral icterus.  Neck: No thyromegaly present.  Cardiovascular: Normal rate and regular rhythm.  No murmur heard. Pulmonary/Chest: Effort normal and breath sounds normal. No respiratory distress. He has no wheezes.  Abdominal: Soft. He exhibits no distension and no mass. There is no tenderness.  Musculoskeletal: He exhibits no edema or tenderness.  Lymphadenopathy:    He has no cervical adenopathy.  Neurological: He is alert   and oriented to person, place,  and time. He has normal reflexes. No cranial nerve deficit.  Skin: Skin is warm and dry. No rash noted. He is not diaphoretic. No erythema.  Interval near complete resolution of the cutaneous rash.     LABORATORY DATA: I have personally reviewed the data as listed: Appointment on 05/08/2017  Component Date Value Ref Range Status  . WBC 05/08/2017 6.9  4.0 - 10.3 10e3/uL Final  . NEUT# 05/08/2017 6.3  1.5 - 6.5 10e3/uL Final  . HGB 05/08/2017 8.5* 13.0 - 17.1 g/dL Final  . HCT 05/08/2017 26.0* 38.4 - 49.9 % Final  . Platelets 05/08/2017 221  140 - 400 10e3/uL Final  . MCV 05/08/2017 97.7  79.3 - 98.0 fL Final  . MCH 05/08/2017 32.0  27.2 - 33.4 pg Final  . MCHC 05/08/2017 32.7  32.0 - 36.0 g/dL Final  . RBC 05/08/2017 2.66* 4.20 - 5.82 10e6/uL Final  . RDW 05/08/2017 15.2* 11.0 - 14.6 % Final  . lymph# 05/08/2017 0.3* 0.9 - 3.3 10e3/uL Final  . MONO# 05/08/2017 0.1  0.1 - 0.9 10e3/uL Final  . Eosinophils Absolute 05/08/2017 0.3  0.0 - 0.5 10e3/uL Final  . Basophils Absolute 05/08/2017 0.0  0.0 - 0.1 10e3/uL Final  . NEUT% 05/08/2017 90.4* 39.0 - 75.0 % Final  . LYMPH% 05/08/2017 4.5* 14.0 - 49.0 % Final  . MONO% 05/08/2017 1.4  0.0 - 14.0 % Final  . EOS% 05/08/2017 3.6  0.0 - 7.0 % Final  . BASO% 05/08/2017 0.1  0.0 - 2.0 % Final  . LDH 05/08/2017 145  125 - 245 U/L Final  . Sodium 05/08/2017 137  136 - 145 mEq/L Final  . Potassium 05/08/2017 3.6  3.5 - 5.1 mEq/L Final  . Chloride 05/08/2017 107  98 - 109 mEq/L Final  . CO2 05/08/2017 20* 22 - 29 mEq/L Final  . Glucose 05/08/2017 146* 70 - 140 mg/dl Final   Glucose reference range is for nonfasting patients. Fasting glucose reference range is 70- 100.  . BUN 05/08/2017 21.9  7.0 - 26.0 mg/dL Final  . Creatinine 05/08/2017 1.8* 0.7 - 1.3 mg/dL Final  . Total Bilirubin 05/08/2017 0.53  0.20 - 1.20 mg/dL Final  . Alkaline Phosphatase 05/08/2017 68  40 - 150 U/L Final  . AST 05/08/2017 10  5 - 34 U/L Final  . ALT 05/08/2017 8  0 -  55 U/L Final  . Total Protein 05/08/2017 6.9  6.4 - 8.3 g/dL Final  . Albumin 05/08/2017 3.6  3.5 - 5.0 g/dL Final  . Calcium 05/08/2017 9.2  8.4 - 10.4 mg/dL Final  . Anion Gap 05/08/2017 11  3 - 11 mEq/L Final  . EGFR 05/08/2017 43* >60 ml/min/1.73 m2 Final   eGFR is calculated using the CKD-EPI Creatinine Equation (2009)  . Beta-2 05/08/2017 4.0* 0.6 - 2.4 mg/L Final   Siemens Immulite 2000 Immunochemiluminometric assay (ICMA)  . IgG, Qn, Serum 05/08/2017 734  700 - 1,600 mg/dL Final  . IgA, Qn, Serum 05/08/2017 177  61 - 437 mg/dL Final  . IgM, Qn, Serum 05/08/2017 54  20 - 172 mg/dL Final  . Total Protein 05/08/2017 6.3  6.0 - 8.5 g/dL Final  . Albumin SerPl Elph-Mcnc 05/08/2017 3.5  2.9 - 4.4 g/dL Final  . Alpha 1 05/08/2017 0.2  0.0 - 0.4 g/dL Final  . Alpha2 Glob SerPl Elph-Mcnc 05/08/2017 0.9  0.4 - 1.0 g/dL Final  . B-Globulin SerPl Elph-Mcnc 05/08/2017 1.0  0.7 -   1.3 g/dL Final  . Gamma Glob SerPl Elph-Mcnc 05/08/2017 0.7  0.4 - 1.8 g/dL Final  . M Protein SerPl Elph-Mcnc 05/08/2017 0.2* Not Observed g/dL Final  . Globulin, Total 05/08/2017 2.8  2.2 - 3.9 g/dL Final  . Albumin/Glob SerPl 05/08/2017 1.3  0.7 - 1.7 Final  . IFE 1 05/08/2017 Comment   Final   Comment: Immunofixation shows IgA monoclonal protein with kappa light chain specificity.   . Please Note 05/08/2017 Comment   Final   Comment: Protein electrophoresis scan will follow via computer, mail, or courier delivery.   . Ig Kappa Free Light Chain 05/08/2017 46.0* 3.3 - 19.4 mg/L Final  . Ig Lambda Free Light Chain 05/08/2017 21.3  5.7 - 26.3 mg/L Final  . Kappa/Lambda FluidC Ratio 05/08/2017 2.16* 0.26 - 1.65 Final  . PROTEIN,TOTAL,URINE 05/18/2017 10.1  Not Estab. mg/dL Final   Total Volume: 2700 mL  . Prot,24hr calculated 05/18/2017 273* 30 - 150 mg/24 hr Final  . ALBUMIN, U 05/18/2017 51.6  % Final  . ALPHA 1 URINE 05/18/2017 4.9  % Final  . ALPHA-2-GLOBULIN, U 05/18/2017 11.9  % Final  . % BETA, Urine  05/18/2017 18.6  % Final  . GAMMA GLOBULIN URINE 05/18/2017 13.0  % Final  . M-SPIKE, % 05/18/2017 Not Observed  Not Observed % Final  . Immunofixation Result, Urine 05/18/2017 Comment   Final   An apparent normal immunofixation pattern.  . NOTE: 05/18/2017 Comment   Final   Comment: Protein electrophoresis scan will follow via computer, mail, or courier delivery.   . Free Kappa Lt Chains,Ur 05/18/2017 34.20* 1.35 - 24.19 mg/L Final  . Free Lambda Lt Chains,Ur 05/18/2017 5.61  0.24 - 6.66 mg/L Final   **Results verified by repeat testing**  . Kappa/Lambda Ratio,U 05/18/2017 6.10  2.04 - 10.37 Final        G , MD 

## 2017-05-22 NOTE — Assessment & Plan Note (Signed)
71 y.o. male with diagnosis of IgA lambda active multiple myeloma, ISS Stage I. Active disease diagnosed based on presence of anemia, kidney insufficiency, and paraproteinemia with significant predominance of lambda light chains as well as significant elevation of IgA. Bone marrow biopsy confirms presence of atypical monoclonal plasma cell process in the bone marrow comprising at least 7% of the cellularity.  Based on the findings, patient was started on treatment with lenalidomide, bortezomib, and low-dose dexamethasone based on the anticipated tolerance by the patient. Lenalidomide was started at 10 mg daily based on creatinine clearance of 42, dexamethasone dose was reduced to 20 mg weekly based on patient's age. Treatment was complicated by rapid development of cutaneous rash attributed to lenalidomide. Rash responded to topical steroids. Renal function also has decreased and now patient has persistent severe anemia also attributable to lenalidomide. Due to the severity of complications, lenalidomide was removed.  Currently, treatment is on hold due to upper respiratory tract infection consistent with a viral illness. Patient is afebrile, he is not neutropenic. He is hemodynamically stable and does not require admission to the hospital.  Plan: --Continue prophylactic acyclovir and low-dose Bactrim to reduce the risk of VZV reactivation and PCP infection respectively --He is receiving aspirin and that should service efficient thromboprophylaxis in the context of lower dose of thalidomide --hold systemic therapy until next week --will resume therapy next week if patient has recovered --For the 3rd cycle of therapy we may consider adjusting the treatment to continue weakly without interruption and change cycles to 4-week intervals --Disease assessment with bone marrow biopsy following completion of the third cycle of systemic therapy --RTC 2wk for lab work, toxicity monitoring and possible initiation  of Cycle #3.

## 2017-05-23 LAB — PHOSPHORUS: PHOSPHORUS: 4.1 mg/dL (ref 2.5–4.5)

## 2017-05-25 ENCOUNTER — Other Ambulatory Visit: Payer: Self-pay

## 2017-05-25 ENCOUNTER — Ambulatory Visit (HOSPITAL_BASED_OUTPATIENT_CLINIC_OR_DEPARTMENT_OTHER): Payer: Medicare HMO

## 2017-05-25 ENCOUNTER — Encounter: Payer: Self-pay | Admitting: Hematology and Oncology

## 2017-05-25 ENCOUNTER — Ambulatory Visit: Payer: Medicare HMO

## 2017-05-25 ENCOUNTER — Telehealth: Payer: Self-pay | Admitting: Hematology and Oncology

## 2017-05-25 ENCOUNTER — Ambulatory Visit: Payer: Medicare HMO | Admitting: Internal Medicine

## 2017-05-25 ENCOUNTER — Ambulatory Visit (HOSPITAL_BASED_OUTPATIENT_CLINIC_OR_DEPARTMENT_OTHER): Payer: Medicare HMO | Admitting: Hematology and Oncology

## 2017-05-25 VITALS — BP 103/54 | HR 81 | Temp 97.6°F | Resp 18 | Ht 64.0 in | Wt 153.9 lb

## 2017-05-25 DIAGNOSIS — Z5111 Encounter for antineoplastic chemotherapy: Secondary | ICD-10-CM

## 2017-05-25 DIAGNOSIS — I10 Essential (primary) hypertension: Secondary | ICD-10-CM | POA: Diagnosis not present

## 2017-05-25 DIAGNOSIS — Z5112 Encounter for antineoplastic immunotherapy: Secondary | ICD-10-CM | POA: Diagnosis not present

## 2017-05-25 DIAGNOSIS — C9000 Multiple myeloma not having achieved remission: Secondary | ICD-10-CM | POA: Diagnosis not present

## 2017-05-25 MED ORDER — PROCHLORPERAZINE MALEATE 10 MG PO TABS
10.0000 mg | ORAL_TABLET | Freq: Once | ORAL | Status: AC
  Administered 2017-05-25: 10 mg via ORAL

## 2017-05-25 MED ORDER — PROCHLORPERAZINE MALEATE 10 MG PO TABS
ORAL_TABLET | ORAL | Status: AC
  Filled 2017-05-25: qty 1

## 2017-05-25 MED ORDER — BORTEZOMIB CHEMO SQ INJECTION 3.5 MG (2.5MG/ML)
1.3000 mg/m2 | Freq: Once | INTRAMUSCULAR | Status: AC
  Administered 2017-05-25: 2.25 mg via SUBCUTANEOUS
  Filled 2017-05-25: qty 2.25

## 2017-05-25 NOTE — Patient Instructions (Signed)
[facility] Discharge Instructions for Patients Receiving Chemotherapy  Today you received the following chemotherapy agents Velcade.  To help prevent nausea and vomiting after your treatment, we encourage you to take your nausea medication as directed.  If you develop nausea and vomiting that is not controlled by your nausea medication, call the clinic.   BELOW ARE SYMPTOMS THAT SHOULD BE REPORTED IMMEDIATELY:  *FEVER GREATER THAN 100.5 F  *CHILLS WITH OR WITHOUT FEVER  NAUSEA AND VOMITING THAT IS NOT CONTROLLED WITH YOUR NAUSEA MEDICATION  *UNUSUAL SHORTNESS OF BREATH  *UNUSUAL BRUISING OR BLEEDING  TENDERNESS IN MOUTH AND THROAT WITH OR WITHOUT PRESENCE OF ULCERS  *URINARY PROBLEMS  *BOWEL PROBLEMS  UNUSUAL RASH Items with * indicate a potential emergency and should be followed up as soon as possible.  Feel free to call the clinic should you have any questions or concerns. The clinic phone number is [phone].  Please show the CHEMO ALERT CARD at check-in to the Emergency Department and triage nurse.   

## 2017-05-25 NOTE — Progress Notes (Signed)
Per Delle Reining RN per Dr. Lebron Conners okay to proceed with Velcade today with labs from 05/22/17 ( creatinine 1.8), no Zometa today.

## 2017-05-25 NOTE — Telephone Encounter (Signed)
Gave avs and calendar for December however I am waiting on Cindy for 11/23 and 11/30

## 2017-06-01 ENCOUNTER — Telehealth: Payer: Self-pay

## 2017-06-01 ENCOUNTER — Ambulatory Visit: Payer: Medicare HMO

## 2017-06-01 ENCOUNTER — Other Ambulatory Visit: Payer: Medicare HMO

## 2017-06-01 ENCOUNTER — Telehealth: Payer: Self-pay | Admitting: Hematology and Oncology

## 2017-06-01 NOTE — Telephone Encounter (Signed)
Tried calling calling patient regarding today appointment I was not able reach him.

## 2017-06-01 NOTE — Telephone Encounter (Signed)
Scheduling unable to get in touch with patient regarding today's appointment. Scheduling message sent to have patient scheduled for 11/30 lab and infusion appt. In-basket sent to Dr. Lebron Conners and Lanelle Bal, RN for any further recommended additions or changes.

## 2017-06-04 ENCOUNTER — Other Ambulatory Visit: Payer: Self-pay | Admitting: Hematology and Oncology

## 2017-06-04 ENCOUNTER — Encounter: Payer: Self-pay | Admitting: Internal Medicine

## 2017-06-04 ENCOUNTER — Ambulatory Visit: Payer: Medicare HMO | Attending: Internal Medicine | Admitting: Internal Medicine

## 2017-06-04 VITALS — BP 113/63 | HR 79 | Temp 98.2°F | Resp 18 | Ht 64.0 in | Wt 159.0 lb

## 2017-06-04 DIAGNOSIS — Z7982 Long term (current) use of aspirin: Secondary | ICD-10-CM | POA: Insufficient documentation

## 2017-06-04 DIAGNOSIS — D638 Anemia in other chronic diseases classified elsewhere: Secondary | ICD-10-CM | POA: Insufficient documentation

## 2017-06-04 DIAGNOSIS — C61 Malignant neoplasm of prostate: Secondary | ICD-10-CM | POA: Diagnosis not present

## 2017-06-04 DIAGNOSIS — R413 Other amnesia: Secondary | ICD-10-CM

## 2017-06-04 DIAGNOSIS — E785 Hyperlipidemia, unspecified: Secondary | ICD-10-CM | POA: Insufficient documentation

## 2017-06-04 DIAGNOSIS — Z794 Long term (current) use of insulin: Secondary | ICD-10-CM | POA: Diagnosis not present

## 2017-06-04 DIAGNOSIS — E1122 Type 2 diabetes mellitus with diabetic chronic kidney disease: Secondary | ICD-10-CM | POA: Diagnosis not present

## 2017-06-04 DIAGNOSIS — C9000 Multiple myeloma not having achieved remission: Secondary | ICD-10-CM

## 2017-06-04 DIAGNOSIS — F17200 Nicotine dependence, unspecified, uncomplicated: Secondary | ICD-10-CM | POA: Diagnosis not present

## 2017-06-04 DIAGNOSIS — Z79899 Other long term (current) drug therapy: Secondary | ICD-10-CM | POA: Diagnosis not present

## 2017-06-04 DIAGNOSIS — Z9221 Personal history of antineoplastic chemotherapy: Secondary | ICD-10-CM | POA: Diagnosis not present

## 2017-06-04 DIAGNOSIS — F1010 Alcohol abuse, uncomplicated: Secondary | ICD-10-CM | POA: Insufficient documentation

## 2017-06-04 DIAGNOSIS — I129 Hypertensive chronic kidney disease with stage 1 through stage 4 chronic kidney disease, or unspecified chronic kidney disease: Secondary | ICD-10-CM | POA: Insufficient documentation

## 2017-06-04 DIAGNOSIS — N183 Chronic kidney disease, stage 3 unspecified: Secondary | ICD-10-CM

## 2017-06-04 DIAGNOSIS — E876 Hypokalemia: Secondary | ICD-10-CM | POA: Diagnosis not present

## 2017-06-04 DIAGNOSIS — Z9889 Other specified postprocedural states: Secondary | ICD-10-CM | POA: Diagnosis not present

## 2017-06-04 DIAGNOSIS — F17210 Nicotine dependence, cigarettes, uncomplicated: Secondary | ICD-10-CM | POA: Diagnosis not present

## 2017-06-04 DIAGNOSIS — Z800 Family history of malignant neoplasm of digestive organs: Secondary | ICD-10-CM | POA: Insufficient documentation

## 2017-06-04 LAB — GLUCOSE, POCT (MANUAL RESULT ENTRY): POC Glucose: 126 mg/dl — AB (ref 70–99)

## 2017-06-04 MED ORDER — NICOTINE 14 MG/24HR TD PT24: 14.0000 mg | MEDICATED_PATCH | Freq: Every day | TRANSDERMAL | 0 refills | Status: DC

## 2017-06-04 MED ORDER — NICOTINE 7 MG/24HR TD PT24: 7.0000 mg | MEDICATED_PATCH | Freq: Every day | TRANSDERMAL | 0 refills | Status: DC

## 2017-06-04 MED FILL — PRAVASTATIN NA 20 MG TAB: 20 | 30 days supply | Qty: 30 | Fill #5

## 2017-06-04 MED FILL — AMLODIPINE BESYLATE 5 MG TA: 5 | 30 days supply | Qty: 30 | Fill #5

## 2017-06-04 MED FILL — TRUEPLUS PEN NDL 32GX5/32: 32G X 4 MM | 50 days supply | Qty: 100 | Fill #0

## 2017-06-04 MED FILL — ACCU-CHEK AVIVA PLUS TEST S: 30 days supply | Qty: 100 | Fill #11

## 2017-06-04 MED FILL — FERROUS SULFATE 325 MG TAB: 325 (65 FE) | 30 days supply | Qty: 30 | Fill #5

## 2017-06-04 MED FILL — NOVOLOG MIX 70-30 FLEXPEN S: (70-30) 100 | 30 days supply | Qty: 3 | Fill #2

## 2017-06-04 NOTE — Progress Notes (Signed)
Patient ID: Robert Sparks, male    DOB: [birth year]  MRN: [id number]  CC: Follow-up   Subjective: Robert Sparks is a 71 y.o. male who presents for 58-monthfollow-up for chronic disease management. His concerns today include:  Patient with history of HTN, diabetes type 2, HL, CKD stage 3, tobacco dependence, EtOH abuse, prostate CA and recently diagnosed multiple myeloma IgA lambda followed by hem/onc Dr. PLebron Conners   1. DM:  On last visit patient was advised to increase NovoLog 70/30 from 5 units to 7 units on days when he takes dexamethasone in association with his chemo treatments.  He has been doing that.  Highest blood sugar reading has been 260 -checks BS 2-3 x a day. Usually good "unless I eat the wrong things."  -saw Podiatry. Had callouses shaved -no appt with Dr. GSchuyler Amoras yet for follow-up on cataracts.   2. Tob: down to 3-4 a day. Trying to quit -quit ETOH 3 mths; was a heavy drinker.   3.  C/o feeling forgetful x 2-3 yrs. No fhx of dementia. No personal hx of CVA.  Went as high as 11th grade in school. Does not feel depress or anxious.  Problems sleeping sometimes usually gets in bed around 6 PM in the evening.  He lives with a roommate.  4.  CKD stage 3: Creatinine improved.  Was 2.0, now down to 1.8 with eGFR 43  Patient Active Problem List   Diagnosis Date Noted  . Pre-ulcerative corn or callous 04/12/2017  . Cataract of both eyes 04/12/2017  . Tobacco dependence 04/12/2017  . Hypokalemia 03/23/2017  . Multiple myeloma not having achieved remission ([facility] [place] 02/23/2017  . Oral leukoplakia 02/09/2017  . Lightheadedness 11/10/2016  . Tobacco abuse 06/21/2016  . Hyperlipidemia 06/21/2016  . CKD (chronic kidney disease) stage 3, GFR 30-59 ml/min (HCC) 02/23/2016  . Malignant neoplasm of prostate ([facility] 02/21/2016  . Controlled type 2 diabetes mellitus with stage 3 chronic kidney disease, with long-term current use of insulin ([place] 03/29/2015  . HTN (hypertension)  07/28/2013  . Anemia, chronic disease 07/26/2013  . ETOH abuse 07/25/2013     Current Outpatient Medications on File Prior to Visit  Medication Sig Dispense Refill  . ACCU-CHEK AVIVA PLUS test strip See admin instructions.  12  . acyclovir (ZOVIRAX) 400 MG tablet Take 1 tablet (400 mg total) by mouth 2 (two) times daily. 60 tablet 3  . amLODipine (NORVASC) 5 MG tablet Take 1 tablet (5 mg total) by mouth daily. 90 tablet 3  . Ascorbic Acid (VITAMIN C) 1000 MG tablet Take 1,000 mg by mouth daily.    .Marland Kitchenaspirin EC 81 MG tablet Take 1 tablet (81 mg total) by mouth daily. 90 tablet 3  . Cholecalciferol (VITAMIN D) 2000 units CAPS Take 2,000 Units by mouth daily.    . ferrous sulfate 325 (65 FE) MG tablet Take 325 mg by mouth daily with breakfast.    . folic acid (FOLVITE) 1 MG tablet Take 1 tablet (1 mg total) by mouth daily. 60 tablet 0  . gabapentin (NEURONTIN) 300 MG capsule Take 1 capsule (300 mg total) by mouth 4 (four) times daily. Take 1 tab po am and lunch, 2 tabs at night. 120 capsule 2  . insulin aspart protamine - aspart (NOVOLOG 70/30 MIX) (70-30) 100 UNIT/ML FlexPen Inject 0.05 mLs (5 Units total) into the skin 2 (two) times daily with a meal. 6 mL 2  . lidocaine-prilocaine (EMLA) cream Apply to affected area once  30 g 3  . LORazepam (ATIVAN) 0.5 MG tablet Take 1 tablet (0.5 mg total) by mouth every 6 (six) hours as needed (Nausea or vomiting). 30 tablet 0  . mometasone (ELOCON) 0.1 % cream Apply 1 application topically daily. 45 g 3  . Multiple Vitamin (MULTIVITAMIN WITH MINERALS) TABS tablet Take 1 tablet by mouth daily. 60 tablet 2  . ondansetron (ZOFRAN) 8 MG tablet Take 1 tablet (8 mg total) by mouth 2 (two) times daily as needed (Nausea or vomiting). 30 tablet 1  . potassium chloride (K-DUR) 10 MEQ tablet TAKE 1 TABLET BY MOUTH DAILY. 7 tablet 0  . pravastatin (PRAVACHOL) 20 MG tablet Take 1 tablet (20 mg total) by mouth daily. 90 tablet 3  . prochlorperazine (COMPAZINE) 10 MG  tablet Take 1 tablet (10 mg total) by mouth every 6 (six) hours as needed (Nausea or vomiting). 30 tablet 1  . senna-docusate (SENOKOT-S) 8.6-50 MG tablet Take 2 tablets by mouth 2 (two) times daily. 60 tablet 2  . sulfamethoxazole-trimethoprim (BACTRIM) 400-80 MG tablet Take 1 tablet by mouth daily. 21 tablet 1  . thiamine 100 MG tablet Take 1 tablet (100 mg total) by mouth daily. 60 tablet 0   Current Facility-Administered Medications on File Prior to Visit  Medication Dose Route Frequency Provider Last Rate Last Dose  . 0.9 %  sodium chloride infusion  500 mL Intravenous Continuous Pyrtle, Lajuan Lines, MD        No Known Allergies  Social History   Socioeconomic History  . Marital status: Single    Spouse name: Not on file  . Number of children: Not on file  . Years of education: Not on file  . Highest education level: Not on file  Social Needs  . Financial resource strain: Not on file  . Food insecurity - worry: Not on file  . Food insecurity - inability: Not on file  . Transportation needs - medical: Not on file  . Transportation needs - non-medical: Not on file  Occupational History  . Not on file  Tobacco Use  . Smoking status: Current Every Day Smoker    Packs/day: 0.25    Years: 50.00    Pack years: 12.50    Types: Cigarettes  . Smokeless tobacco: Never Used  . Tobacco comment: 1 ppwk-4-5 a day   Substance and Sexual Activity  . Alcohol use: Yes    Alcohol/week: 0.6 oz    Types: 1 Cans of beer per week    Comment: 1 every day-quit couple weeks ago  . Drug use: No  . Sexual activity: Not Currently  Other Topics Concern  . Not on file  Social History Narrative  . Not on file    Family History  Problem Relation Age of Onset  . Cancer Neg Hx   . Colon cancer Neg Hx   . Colon polyps Neg Hx   . Esophageal cancer Neg Hx   . Rectal cancer Neg Hx   . Stomach cancer Neg Hx     Past Surgical History:  Procedure Laterality Date  . CIRCUMCISION  1990's  . PROSTATE  BIOPSY N/A 12/21/2015   Procedure: BIOPSY TRANSRECTAL ULTRASONIC PROSTATE (TUBP);  Surgeon: Festus Aloe, MD;  Location: [facility];  Service: Urology;  Laterality: N/A;    ROS: Review of Systems  Constitutional:       Chemotherapy held over the past few weeks due to recent viral illness.  Patient states he has gotten over it.  Respiratory: Negative for cough and shortness of breath.   Cardiovascular: Negative for chest pain.  Gastrointestinal: Negative for diarrhea.    PHYSICAL EXAM: BP 120/81 (BP Location: Left Arm, Patient Position: Sitting, Cuff Size: Normal)   Pulse 75   Temp 98.2 F (36.8 C) (Oral)   Resp 18   Ht _0  (1.575 m)   Wt 143 lb (64.9 kg)   SpO2 98%   BMI 26.16 kg/m   Physical Exam  General appearance - alert, well appearing, and in no distress Mental status - alert, oriented to person, place, and time, normal mood, behavior, speech, dress, motor activity, and thought processes Neck - supple, no significant adenopathy Chest - clear to auscultation, no wheezes, rales or rhonchi, symmetric air entry Heart - normal rate, regular rhythm, normal S1, S2, no murmurs, rubs, clicks or gallops Neurological - pt oriented x 3. Scored 4/5 on MiniCog (drew clock and was asked to set time to 10 mins after 2. He drew clock and placed short hand correctly but had problems figuring out where to place long hand.) Extremities - no LE edema  Results for orders placed or performed in visit on 05/22/17  Phosphorus  Result Value Ref Range   Phosphorus, Ser 4.1 2.5 - 4.5 mg/dL  Magnesium  Result Value Ref Range   Magnesium 2.9 (H) 1.5 - 2.5 mg/dl  Comprehensive metabolic panel  Result Value Ref Range   Sodium 137 136 - 145 mEq/L   Potassium 3.6 3.5 - 5.1 mEq/L   Chloride 106 98 - 109 mEq/L   CO2 22 22 - 29 mEq/L   Glucose 177 (H) 70 - 140 mg/dl   BUN 23.4 7.0 - 26.0 mg/dL   Creatinine 1.8 (H) 0.7 - 1.3 mg/dL   Total Bilirubin 0.39 0.20 - 1.20 mg/dL    Alkaline Phosphatase 87 40 - 150 U/L   AST 14 5 - 34 U/L   ALT 14 0 - 55 U/L   Total Protein 6.8 6.4 - 8.3 g/dL   Albumin 3.7 3.5 - 5.0 g/dL   Calcium 9.2 8.4 - 10.4 mg/dL   Anion Gap 9 3 - 11 mEq/L   EGFR 43 (L) >60 ml/min/1.73 m2  CBC with Differential  Result Value Ref Range   WBC 3.8 (L) 4.0 - 10.3 10e3/uL   NEUT# 2.4 1.5 - 6.5 10e3/uL   HGB 8.9 (L) 13.0 - 17.1 g/dL   HCT 26.9 (L) 38.4 - 49.9 %   Platelets 223 140 - 400 10e3/uL   MCV 97.7 79.3 - 98.0 fL   MCH 32.2 27.2 - 33.4 pg   MCHC 32.9 32.0 - 36.0 g/dL   RBC 2.76 (L) 4.20 - 5.82 10e6/uL   RDW 16.5 (H) 11.0 - 14.6 %   lymph# 0.8 (L) 0.9 - 3.3 10e3/uL   MONO# 0.4 0.1 - 0.9 10e3/uL   Eosinophils Absolute 0.3 0.0 - 0.5 10e3/uL   Basophils Absolute 0.0 0.0 - 0.1 10e3/uL   NEUT% 62.1 39.0 - 75.0 %   LYMPH% 19.6 14.0 - 49.0 %   MONO% 9.7 0.0 - 14.0 %   EOS% 8.0 (H) 0.0 - 7.0 %   BASO% 0.6 0.0 - 2.0 %   Lab Results  Component Value Date   HGBA1C 7.3 04/12/2017  BS 126 Depression screen [facility] 2/9 06/04/2017 04/12/2017 11/21/2016 10/02/2016 08/15/2016  Decreased Interest 0 _1 0  Down, Depressed, Hopeless 0 0 2 0 0  PHQ - 2 Score 0 _2 0  Altered sleeping _0 0 -  Tired, decreased energy _1 -  Change in appetite 0 0 0 3 -  Feeling bad or failure about yourself  0 0 0 0 -  Trouble concentrating 0 0 0 0 -  Moving slowly or fidgety/restless 0 0 0 0 -  Suicidal thoughts 0 0 0 0 -  PHQ-9 Score _2 -   ASSESSMENT AND PLAN: 1. Controlled type 2 diabetes mellitus with stage 3 chronic kidney disease, with long-term current use of insulin (HCC) Cont current insulin regiment. Advised to be mindful of portion sizes over the holidays - Glucose (CBG)  2. Tobacco dependence -Discussed smoking cessation methods.  Patient wanting to try nicotine patches.  I went over the stepdown approach.  Advised to start on the 14 mg patches times 1 month then 7 mg x 1 month Less than 10 minutes spent on counseling - nicotine  (NICODERM CQ - DOSED IN MG/24 HOURS) 14 mg/24hr patch; Place 1 patch (14 mg total) onto the skin daily.  Dispense: 30 patch; Refill: 0 - nicotine (NICODERM CQ - DOSED IN MG/24 HR) 7 mg/24hr patch; Place 1 patch (7 mg total) onto the skin daily.  Dispense: 28 patch; Refill: 0  3. Memory changes DDx include due to chemo vs early dementia vs due to ETOH -did ok on Minicog. May do [place] or MME on a next visit.  -advise to remain free of ETOH. - TSH - Vitamin B12  4. CKD (chronic kidney disease) stage 3, GFR 30-59 ml/min (HCC) stable   Patient was given the opportunity to ask questions.  Patient verbalized understanding of the plan and was able to repeat key elements of the plan.   No orders of the defined types were placed in this encounter.    Requested Prescriptions    No prescriptions requested or ordered in this encounter    F/u in 3 mths Karle Plumber, MD, Rosalita Chessman

## 2017-06-04 NOTE — Progress Notes (Signed)
Patient here for Follow up

## 2017-06-04 NOTE — Patient Instructions (Signed)
Start using the nicotine patches daily. Start with the 145 mg patches and use daily for 1 month then step done to the 7 mg patches.   Stop at the laboratory on your way out today.

## 2017-06-05 ENCOUNTER — Telehealth: Payer: Self-pay | Admitting: Hematology and Oncology

## 2017-06-05 LAB — VITAMIN B12: Vitamin B-12: 235 pg/mL (ref 232–1245)

## 2017-06-05 LAB — TSH: TSH: 1.52 u[IU]/mL (ref 0.450–4.500)

## 2017-06-05 NOTE — Telephone Encounter (Signed)
I called patient on a number that was given by the lady that answered the home number that was on file. The man that answered said he was driving at the time so I asked if he could have the patient call his doctor.

## 2017-06-06 ENCOUNTER — Other Ambulatory Visit: Payer: Self-pay | Admitting: Radiology

## 2017-06-06 ENCOUNTER — Telehealth: Payer: Self-pay | Admitting: *Deleted

## 2017-06-06 ENCOUNTER — Other Ambulatory Visit: Payer: Self-pay | Admitting: Student

## 2017-06-06 ENCOUNTER — Telehealth: Payer: Self-pay | Admitting: Internal Medicine

## 2017-06-06 MED ORDER — CYANOCOBALAMIN 500 MCG PO TABS: 500.0000 ug | ORAL_TABLET | Freq: Two times a day (BID) | ORAL | 6 refills | Status: DC

## 2017-06-06 NOTE — Telephone Encounter (Signed)
Attempted to reach patient this a.m. via phone to discuss lab results.  Home phone answered by answering machine stating that this is the [place] [facility].  I did not leave a message.  Patient's cell phone gives a recording that this is restricted or the customer is unavailable and to contact customer service.  I will have my CMA attempt to reach him again. Lab results show normal thyroid level.  He has a low normal vitamin B12.  Patient needs to be started on B12 supplement 500 mcg BID. Prescription sent to his pharmacy.

## 2017-06-06 NOTE — Telephone Encounter (Signed)
""  It's a recall.  Call [phone].  Okay for time to come." Received patient information with return call.  "My roommate called because my phone is out.  I came yesterday, told nothing is scheduled.  When am I to come in?"  Provided Medical Day appointment.  "I know I arrive at 6:00 am for that.": Come to [facility] at 2:00 pm to register for [facility] lab, infusion, F/U appointment with Dr. Irene Limbo.  With teach back method he repeated appointment dates and times.  "I got it."  Denies further questions or needs at this time.

## 2017-06-07 ENCOUNTER — Other Ambulatory Visit: Payer: Self-pay | Admitting: Student

## 2017-06-07 ENCOUNTER — Other Ambulatory Visit: Payer: Self-pay | Admitting: General Surgery

## 2017-06-08 ENCOUNTER — Ambulatory Visit (HOSPITAL_COMMUNITY)
Admission: RE | Admit: 2017-06-08 | Discharge: 2017-06-08 | Disposition: A | Discharge status: Home / Self Care | Payer: Medicare HMO | Source: Ambulatory Visit | Attending: Hematology and Oncology | Admitting: Hematology and Oncology

## 2017-06-08 ENCOUNTER — Ambulatory Visit (HOSPITAL_BASED_OUTPATIENT_CLINIC_OR_DEPARTMENT_OTHER): Payer: Medicare HMO

## 2017-06-08 ENCOUNTER — Encounter (HOSPITAL_COMMUNITY): Payer: Self-pay

## 2017-06-08 ENCOUNTER — Other Ambulatory Visit (HOSPITAL_BASED_OUTPATIENT_CLINIC_OR_DEPARTMENT_OTHER): Payer: Medicare HMO

## 2017-06-08 VITALS — BP 141/78 | HR 71 | Temp 97.8°F | Resp 169

## 2017-06-08 DIAGNOSIS — Z794 Long term (current) use of insulin: Secondary | ICD-10-CM | POA: Diagnosis not present

## 2017-06-08 DIAGNOSIS — C9000 Multiple myeloma not having achieved remission: Secondary | ICD-10-CM

## 2017-06-08 DIAGNOSIS — Z79899 Other long term (current) drug therapy: Secondary | ICD-10-CM | POA: Insufficient documentation

## 2017-06-08 DIAGNOSIS — I129 Hypertensive chronic kidney disease with stage 1 through stage 4 chronic kidney disease, or unspecified chronic kidney disease: Secondary | ICD-10-CM | POA: Insufficient documentation

## 2017-06-08 DIAGNOSIS — Z8546 Personal history of malignant neoplasm of prostate: Secondary | ICD-10-CM | POA: Insufficient documentation

## 2017-06-08 DIAGNOSIS — N183 Chronic kidney disease, stage 3 (moderate): Secondary | ICD-10-CM | POA: Insufficient documentation

## 2017-06-08 DIAGNOSIS — E1122 Type 2 diabetes mellitus with diabetic chronic kidney disease: Secondary | ICD-10-CM | POA: Insufficient documentation

## 2017-06-08 DIAGNOSIS — D649 Anemia, unspecified: Secondary | ICD-10-CM | POA: Diagnosis not present

## 2017-06-08 DIAGNOSIS — Z5112 Encounter for antineoplastic immunotherapy: Secondary | ICD-10-CM | POA: Diagnosis not present

## 2017-06-08 DIAGNOSIS — E785 Hyperlipidemia, unspecified: Secondary | ICD-10-CM | POA: Insufficient documentation

## 2017-06-08 DIAGNOSIS — E1140 Type 2 diabetes mellitus with diabetic neuropathy, unspecified: Secondary | ICD-10-CM | POA: Insufficient documentation

## 2017-06-08 LAB — PROTIME-INR
INR: 0.97
Prothrombin Time: 12.8 seconds (ref 11.4–15.2)

## 2017-06-08 LAB — BASIC METABOLIC PANEL
ANION GAP: 10 meq/L (ref 3–11)
BUN: 21.7 mg/dL (ref 7.0–26.0)
CHLORIDE: 109 meq/L (ref 98–109)
CO2: 22 meq/L (ref 22–29)
Calcium: 9 mg/dL (ref 8.4–10.4)
Creatinine: 1.4 mg/dL — ABNORMAL HIGH (ref 0.7–1.3)
EGFR: 57 mL/min/{1.73_m2} — AB (ref >60)
GLUCOSE: 124 mg/dL (ref 70–140)
POTASSIUM: 3.4 meq/L — AB (ref 3.5–5.1)
Sodium: 141 mEq/L (ref 136–145)

## 2017-06-08 LAB — CBC
HEMATOCRIT: 29.2 % — AB (ref 39.0–52.0)
Hemoglobin: 9.8 g/dL — ABNORMAL LOW (ref 13.0–17.0)
MCH: 32.3 pg (ref 26.0–34.0)
MCHC: 33.6 g/dL (ref 30.0–36.0)
MCV: 96.4 fL (ref 78.0–100.0)
PLATELETS: 222 10*3/uL (ref 150–400)
RBC: 3.03 MIL/uL — ABNORMAL LOW (ref 4.22–5.81)
RDW: 14.5 % (ref 11.5–15.5)
WBC: 5.9 10*3/uL (ref 4.0–10.5)

## 2017-06-08 LAB — GLUCOSE, CAPILLARY
Glucose-Capillary: 114 mg/dL — ABNORMAL HIGH (ref 65–99)
Glucose-Capillary: 108 mg/dL — ABNORMAL HIGH (ref 65–99)

## 2017-06-08 LAB — APTT: aPTT: 35 seconds (ref 24–36)

## 2017-06-08 MED ORDER — PROCHLORPERAZINE MALEATE 10 MG PO TABS
10.0000 mg | ORAL_TABLET | Freq: Once | ORAL | Status: AC
  Administered 2017-06-08: 10 mg via ORAL

## 2017-06-08 MED ORDER — PROCHLORPERAZINE MALEATE 10 MG PO TABS
ORAL_TABLET | ORAL | Status: AC
  Filled 2017-06-08: qty 1

## 2017-06-08 MED ORDER — BORTEZOMIB CHEMO SQ INJECTION 3.5 MG (2.5MG/ML)
1.3000 mg/m2 | Freq: Once | INTRAMUSCULAR | Status: AC
  Administered 2017-06-08: 2.25 mg via SUBCUTANEOUS
  Filled 2017-06-08: qty 2.25

## 2017-06-08 MED ORDER — SODIUM CHLORIDE 0.9 % IV SOLN
INTRAVENOUS | Status: DC
  Administered 2017-06-08: 07:00:00 via INTRAVENOUS

## 2017-06-08 MED ORDER — FENTANYL CITRATE (PF) 100 MCG/2ML IJ SOLN
INTRAMUSCULAR | Status: AC
  Filled 2017-06-08: qty 4

## 2017-06-08 MED ORDER — FLUMAZENIL 0.5 MG/5ML IV SOLN
INTRAVENOUS | Status: AC
  Filled 2017-06-08: qty 5

## 2017-06-08 MED ORDER — FENTANYL CITRATE (PF) 100 MCG/2ML IJ SOLN
INTRAMUSCULAR | Status: AC | PRN
  Administered 2017-06-08 (×2): 25 ug via INTRAVENOUS
  Administered 2017-06-08: 50 ug via INTRAVENOUS

## 2017-06-08 MED ORDER — MIDAZOLAM HCL 2 MG/2ML IJ SOLN
INTRAMUSCULAR | Status: AC
  Filled 2017-06-08: qty 4

## 2017-06-08 MED ORDER — MIDAZOLAM HCL 2 MG/2ML IJ SOLN
INTRAMUSCULAR | Status: AC | PRN
  Administered 2017-06-08 (×2): 1 mg via INTRAVENOUS

## 2017-06-08 MED ORDER — LIDOCAINE HCL 1 % IJ SOLN
INTRAMUSCULAR | Status: AC | PRN
  Administered 2017-06-08: 20 mL

## 2017-06-08 MED ORDER — NALOXONE HCL 0.4 MG/ML IJ SOLN
INTRAMUSCULAR | Status: AC
  Filled 2017-06-08: qty 1

## 2017-06-08 NOTE — Procedures (Signed)
Interventional Radiology Procedure Note  Procedure: CT guided aspirate and core biopsy of right iliac bone Complications: None Recommendations: - Bedrest supine x 1 hrs - Follow biopsy results  Braylen Staller T. Jasmia Angst, M.D Pager:  [phone]   

## 2017-06-08 NOTE — Patient Instructions (Signed)
[facility] Discharge Instructions for Patients Receiving Chemotherapy  Today you received the following chemotherapy agents Velcade To help prevent nausea and vomiting after your treatment, we encourage you to take your nausea medication as prescribed.   If you develop nausea and vomiting that is not controlled by your nausea medication, call the clinic.   BELOW ARE SYMPTOMS THAT SHOULD BE REPORTED IMMEDIATELY:  *FEVER GREATER THAN 100.5 F  *CHILLS WITH OR WITHOUT FEVER  NAUSEA AND VOMITING THAT IS NOT CONTROLLED WITH YOUR NAUSEA MEDICATION  *UNUSUAL SHORTNESS OF BREATH  *UNUSUAL BRUISING OR BLEEDING  TENDERNESS IN MOUTH AND THROAT WITH OR WITHOUT PRESENCE OF ULCERS  *URINARY PROBLEMS  *BOWEL PROBLEMS  UNUSUAL RASH Items with * indicate a potential emergency and should be followed up as soon as possible.  Feel free to call the clinic should you have any questions or concerns. The clinic phone number is [phone].  Please show the CHEMO ALERT CARD at check-in to the Emergency Department and triage nurse.   

## 2017-06-08 NOTE — H&P (Signed)
Referring Physician(s): [place]  Supervising Physician: Aletta Edouard  Patient Status:  [facility] OP  Chief Complaint:  "I'm here for another bone marrow biopsy"  Subjective: Patient familiar to IR service from prior bone marrow biopsy on 03/13/17.  He has a history of multiple myeloma and presents again today for follow-up bone marrow biopsy to assess treatment response.  He currently denies fever, headache, chest pain, dyspnea, cough, abdominal/back pain, nausea, vomiting or bleeding. Past Medical History:  Diagnosis Date  . Anemia   . Arthritis    shoulders,feet   . Cataract    per eye dr -has appt 5-11  . CKD (chronic kidney disease), stage III ([place])   . Elevated PSA   . History of ketoacidosis    03-29-2015   . History of sepsis    07-25-2013  non-compliant w/ medication  . Hyperlipidemia   . Hypertension   . Nocturia   . Peripheral neuropathy   . Prostate cancer ([place])   . Type 2 diabetes mellitus with insulin therapy [facility])    Past Surgical History:  Procedure Laterality Date  . CIRCUMCISION  1990's  . PROSTATE BIOPSY N/A 12/21/2015   Procedure: BIOPSY TRANSRECTAL ULTRASONIC PROSTATE (TUBP);  Surgeon: Festus Aloe, MD;  Location: [facility];  Service: Urology;  Laterality: N/A;     Allergies: Patient has no known allergies.  Medications: Prior to Admission medications   Medication Sig Start Date End Date Taking? Authorizing Provider  ACCU-CHEK AVIVA PLUS test strip See admin instructions. 01/03/17  Yes [provider]  acyclovir (ZOVIRAX) 400 MG tablet Take 1 tablet (400 mg total) by mouth 2 (two) times daily. 03/08/17  Yes Perlov, Marinell Blight, MD  amLODipine (NORVASC) 5 MG tablet Take 1 tablet (5 mg total) by mouth daily. 11/21/16  Yes Langeland, Dawn T, MD  Ascorbic Acid (VITAMIN C) 1000 MG tablet Take 1,000 mg by mouth daily.   Yes [provider]  aspirin EC 81 MG tablet Take 1 tablet (81 mg total) by mouth daily.  10/02/16  Yes Langeland, Dawn T, MD  Cholecalciferol (VITAMIN D) 2000 units CAPS Take 2,000 Units by mouth daily.   Yes [provider]  cyanocobalamin (V-R VITAMIN B-12) 500 MCG tablet Take 1 tablet (500 mcg total) by mouth 2 (two) times daily. 06/06/17  Yes [name], MD  ferrous sulfate 325 (65 FE) MG tablet Take 325 mg by mouth daily with breakfast.   Yes [provider]  folic acid (FOLVITE) 1 MG tablet Take 1 tablet (1 mg total) by mouth daily. 11/12/16  Yes Lorella Nimrod, MD  gabapentin (NEURONTIN) 300 MG capsule Take 1 capsule (300 mg total) by mouth 4 (four) times daily. Take 1 tab po am and lunch, 2 tabs at night. 02/15/17  Yes [name], MD  insulin aspart protamine - aspart (NOVOLOG 70/30 MIX) (70-30) 100 UNIT/ML FlexPen Inject 0.05 mLs (5 Units total) into the skin 2 (two) times daily with a meal. Patient taking differently: Inject 5 Units into the skin 2 (two) times daily with a meal.  04/11/17  Yes McClung, Angela M, PA-C  LORazepam (ATIVAN) 0.5 MG tablet Take 1 tablet (0.5 mg total) by mouth every 6 (six) hours as needed (Nausea or vomiting). 03/08/17  Yes Perlov, Mikhail Darnell Level, MD  mometasone (ELOCON) 0.1 % cream Apply 1 application topically daily. 03/30/17  Yes [name], MD  Multiple Vitamin (MULTIVITAMIN WITH MINERALS) TABS tablet Take 1 tablet by mouth daily. 11/12/16  Yes Lorella Nimrod, MD  potassium chloride (K-DUR) 10 MEQ tablet TAKE 1 TABLET BY MOUTH DAILY. 05/07/17  Yes [name], MD  pravastatin (PRAVACHOL) 20 MG tablet Take 1 tablet (20 mg total) by mouth daily. 11/21/16  Yes Langeland, Dawn T, MD  senna-docusate (SENOKOT-S) 8.6-50 MG tablet Take 2 tablets by mouth 2 (two) times daily. 11/11/16  Yes Lorella Nimrod, MD  thiamine 100 MG tablet Take 1 tablet (100 mg total) by mouth daily. 11/12/16  Yes Lorella Nimrod, MD  dexamethasone (DECADRON) 4 MG tablet TAKE 5 TABLETS BY MOUTH ONCE A WEEK. 06/04/17   [name], MD    lidocaine-prilocaine (EMLA) cream Apply to affected area once 03/08/17   Perlov, Marinell Blight, MD  nicotine (NICODERM CQ - DOSED IN MG/24 HOURS) 14 mg/24hr patch Place 1 patch (14 mg total) onto the skin daily. 06/04/17   [name], MD  nicotine (NICODERM CQ - DOSED IN MG/24 HR) 7 mg/24hr patch Place 1 patch (7 mg total) onto the skin daily. 06/04/17   [name], MD  ondansetron (ZOFRAN) 8 MG tablet Take 1 tablet (8 mg total) by mouth 2 (two) times daily as needed (Nausea or vomiting). 03/08/17   [name], MD  prochlorperazine (COMPAZINE) 10 MG tablet Take 1 tablet (10 mg total) by mouth every 6 (six) hours as needed (Nausea or vomiting). 03/08/17   [name], MD  sulfamethoxazole-trimethoprim (BACTRIM) 400-80 MG tablet Take 1 tablet by mouth daily. 03/30/17   [name], MD     Vital Signs: BP (!) 160/83   Pulse 80   Temp 98.1 F (36.7 C) (Oral)   Resp 18   Ht '5\' 4"'  (1.626 m)   Wt 159 lb (72.1 kg)   SpO2 100%   BMI 27.29 kg/m   Physical Exam awake, alert.  Chest clear to auscultation bilaterally.  Heart with regular rate and rhythm.  Abdomen soft, positive bowel sounds, nontender.  No lower extremity edema.  Imaging: No results found.  Labs:  CBC: Recent Labs    04/27/17 0812 05/08/17 0806 05/22/17 1109 06/08/17 0710  WBC 5.5 6.9 3.8* 5.9  HGB 8.2* 8.5* 8.9* 9.8*  HCT 25.4* 26.0* 26.9* 29.2*  PLT 214 221 223 222    COAGS: Recent Labs    03/13/17 0922 06/08/17 0710  INR 0.98 0.97  APTT 34 35    BMP: Recent Labs    11/10/16 1145 11/10/16 1851 11/11/16 1130 11/21/16 1416  04/20/17 0753 04/27/17 0812 05/08/17 0806 05/22/17 1109  NA 132* 134* 135 139   < > 136 138 137 137  K 5.3* 4.8 4.8 4.2   < > 3.6 3.5 3.6 3.6  CL 105 107 107 105  --   --   --   --   --   CO2 17* 17* 20* 19   < > 20* 22 20* 22  GLUCOSE 153* 187* 182* 123*   < > 144* 154* 146* 177*  BUN 50* 47* 33* 20   < > 29.6* 37.2* 21.9 23.4  CALCIUM 9.1 8.5*  9.0 9.7   < > 9.1 8.9 9.2 9.2  CREATININE 2.56* 2.10* 1.67* 1.26   < > 2.0* 2.0* 1.8* 1.8*  GFRNONAA 24* 31* 40* 58*  --   --   --   --   --   GFRAA 28* 36* 47* 67  --   --   --   --   --    < > =  values in this interval not displayed.    LIVER FUNCTION TESTS: Recent Labs    04/20/17 0753 04/27/17 0812 05/08/17 0806 05/08/17 0806 05/22/17 1109  BILITOT 0.37 0.38  --  0.53 0.39  AST 10 15  --  10 14  ALT 8 11  --  8 14  ALKPHOS 99 141  --  68 87  PROT 7.0 6.6 6.3 6.9 6.8  ALBUMIN 3.7 3.4*  --  3.6 3.7    Assessment and Plan: Patient with history of multiple myeloma, status post treatment.  He presents today for CT-guided bone marrow biopsy to assess treatment response.Risks and benefits discussed with the patient including, but not limited to bleeding, infection, damage to adjacent structures or low yield requiring additional tests.All of the patient's questions were answered, patient is agreeable to proceed.Consent signed and in chart.     Electronically Signed: D. Rowe Robert, PA-C 06/08/2017, 8:22 AM   I spent a total of 20 minutes at the the patient's bedside AND on the patient's hospital floor or unit, greater than 50% of which was counseling/coordinating care for CT-guided bone marrow biopsy

## 2017-06-08 NOTE — Discharge Instructions (Signed)
Moderate Conscious Sedation, Adult, Care After These instructions provide you with information about caring for yourself after your procedure. Your health care provider may also give you more specific instructions. Your treatment has been planned according to current medical practices, but problems sometimes occur. Call your health care provider if you have any problems or questions after your procedure. What can I expect after the procedure? After your procedure, it is common:  To feel sleepy for several hours.  To feel clumsy and have poor balance for several hours.  To have poor judgment for several hours.  To vomit if you eat too soon.  Follow these instructions at home: For at least 24 hours after the procedure:   Do not: ? Participate in activities where you could fall or become injured. ? Drive. ? Use heavy machinery. ? Drink alcohol. ? Take sleeping pills or medicines that cause drowsiness. ? Make important decisions or sign legal documents. ? Take care of children on your own.  Rest. Eating and drinking  Follow the diet recommended by your health care provider.  If you vomit: ? Drink water, juice, or soup when you can drink without vomiting. ? Make sure you have little or no nausea before eating solid foods. General instructions  Have a responsible adult stay with you until you are awake and alert.  Take over-the-counter and prescription medicines only as told by your health care provider.  If you smoke, do not smoke without supervision.  Keep all follow-up visits as told by your health care provider. This is important. Contact a health care provider if:  You keep feeling nauseous or you keep vomiting.  You feel light-headed.  You develop a rash.  You have a fever. Get help right away if:  You have trouble breathing. This information is not intended to replace advice given to you by your health care provider. Make sure you discuss any questions you have  with your health care provider. Document Released: 04/16/2013 Document Revised: 11/29/2015 Document Reviewed: 10/16/2015 Elsevier Interactive Patient Education  2018 [employer]. Needle Biopsy of the Bone, Care After Refer to this sheet in the next few weeks. These instructions provide you with information about caring for yourself after your procedure. Your health care provider may also give you more specific instructions. Your treatment has been planned according to current medical practices, but problems sometimes occur. Call your health care provider if you have any problems or questions after your procedure. What can I expect after the procedure? After your procedure, it is common to have soreness or tenderness at the puncture site. Follow these instructions at home:  Take over-the-counter and prescription medicines only as told by your health care provider.  Bathe and shower as told by your health care provider.  Follow instructions from your health care provider about: ? How to take care of your puncture site. ? When and how you should change your bandage (dressing). ? When you should remove your dressing.  Check your puncture site every day for signs of infection. Watch for: ? Redness, swelling, or worsening pain. ? Fluid, blood, or pus.  Return to your normal activities as told by your health care provider.  Keep all follow-up visits as told by your health care provider. This is important. Contact a health care provider if:  You have redness, swelling, or worsening pain at the site of your puncture.  You have fluid, blood, or pus coming from your puncture site.  You have a fever.  You  have persistent nausea or vomiting. Get help right away if:  You develop a rash.  You have difficulty breathing. This information is not intended to replace advice given to you by your health care provider. Make sure you discuss any questions you have with your health care  provider. Document Released: 01/13/2005 Document Revised: 12/02/2015 Document Reviewed: 08/03/2014 Elsevier Interactive Patient Education  [employer].

## 2017-06-08 NOTE — Progress Notes (Signed)
Called cancer center to see if they could take pt earlier for infusion since he is being d/c at 1100 from short stay, cancer center is able to accomodate pt earlier.  Took pt to cancer center at d/c via wheelchair.  Called pt's room mate, Hedy Camara, and went over d/c instructions over the phone and informed him pt was going on to cancer center appointment for his appointment and the cancer center would call him when pt is done.  Gave Amy in the infusion center [facility]'s number.  Left IV #22 to the rt hand intact, flushed and saline locked, Amy in infusion center states they can use it and if not they will d/c site.

## 2017-06-12 ENCOUNTER — Other Ambulatory Visit: Payer: Self-pay | Admitting: Hematology and Oncology

## 2017-06-12 DIAGNOSIS — C9000 Multiple myeloma not having achieved remission: Secondary | ICD-10-CM

## 2017-06-12 MED FILL — ACYCLOVIR 400 MG TABLET: 400 | 30 days supply | Qty: 60 | Fill #2

## 2017-06-12 MED FILL — DEXAMETHASONE 4 MG TABLET: 4 | 21 days supply | Qty: 15 | Fill #0

## 2017-06-12 MED FILL — POTASSIUM CL 10 MEQ TAB SA: 10 | 7 days supply | Qty: 7 | Fill #0

## 2017-06-13 ENCOUNTER — Other Ambulatory Visit (HOSPITAL_BASED_OUTPATIENT_CLINIC_OR_DEPARTMENT_OTHER): Payer: Medicare HMO

## 2017-06-13 DIAGNOSIS — C9000 Multiple myeloma not having achieved remission: Secondary | ICD-10-CM

## 2017-06-13 LAB — COMPREHENSIVE METABOLIC PANEL
ALK PHOS: 111 U/L (ref 40–150)
ALT: 11 U/L (ref 0–55)
ANION GAP: 12 meq/L — AB (ref 3–11)
AST: 13 U/L (ref 5–34)
Albumin: 3.8 g/dL (ref 3.5–5.0)
BILIRUBIN TOTAL: 0.42 mg/dL (ref 0.20–1.20)
BUN: 23.2 mg/dL (ref 7.0–26.0)
CALCIUM: 9.7 mg/dL (ref 8.4–10.4)
CO2: 24 meq/L (ref 22–29)
Chloride: 104 mEq/L (ref 98–109)
Creatinine: 1.6 mg/dL — ABNORMAL HIGH (ref 0.7–1.3)
EGFR: 49 mL/min/{1.73_m2} — AB (ref >60)
Glucose: 84 mg/dl (ref 70–140)
Potassium: 3.2 mEq/L — ABNORMAL LOW (ref 3.5–5.1)
SODIUM: 141 meq/L (ref 136–145)
TOTAL PROTEIN: 7.1 g/dL (ref 6.4–8.3)

## 2017-06-13 LAB — CBC WITH DIFFERENTIAL/PLATELET
BASO%: 0.6 % (ref 0.0–2.0)
BASOS ABS: 0 10*3/uL (ref 0.0–0.1)
EOS%: 12.7 % — AB (ref 0.0–7.0)
Eosinophils Absolute: 0.5 10*3/uL (ref 0.0–0.5)
HEMATOCRIT: 28.5 % — AB (ref 38.4–49.9)
HEMOGLOBIN: 9.4 g/dL — AB (ref 13.0–17.1)
LYMPH#: 0.8 10*3/uL — AB (ref 0.9–3.3)
LYMPH%: 21.5 % (ref 14.0–49.0)
MCH: 31.8 pg (ref 27.2–33.4)
MCHC: 33 g/dL (ref 32.0–36.0)
MCV: 96.3 fL (ref 79.3–98.0)
MONO#: 0.3 10*3/uL (ref 0.1–0.9)
MONO%: 8.8 % (ref 0.0–14.0)
NEUT%: 56.4 % (ref 39.0–75.0)
NEUTROS ABS: 2.1 10*3/uL (ref 1.5–6.5)
Platelets: 204 10*3/uL (ref 140–400)
RBC: 2.96 10*6/uL — ABNORMAL LOW (ref 4.20–5.82)
RDW: 14.1 % (ref 11.0–14.6)
WBC: 3.6 10*3/uL — AB (ref 4.0–10.3)

## 2017-06-13 LAB — LACTATE DEHYDROGENASE: LDH: 153 U/L (ref 125–245)

## 2017-06-13 LAB — MAGNESIUM: Magnesium: 2.4 mg/dl (ref 1.5–2.5)

## 2017-06-13 NOTE — Progress Notes (Signed)
[facility] Follow-up Visit:  Assessment: Multiple myeloma not having achieved remission (Robert Sparks) 71 y.o. male with diagnosis of IgA lambda active multiple myeloma, ISS Stage I. Active disease diagnosed based on presence of anemia, kidney insufficiency, and paraproteinemia with significant predominance of lambda light chains as well as significant elevation of IgA. Bone marrow biopsy confirms presence of atypical monoclonal plasma cell process in the bone marrow comprising at least 7% of the cellularity.  Based on the findings, patient was started on treatment with lenalidomide, bortezomib, and low-dose dexamethasone based on the anticipated tolerance by the patient. Lenalidomide was started at 10 mg daily based on creatinine clearance of 42, dexamethasone dose was reduced to 20 mg weekly based on patient's age. Treatment was complicated by rapid development of cutaneous rash attributed to lenalidomide. Rash responded to topical steroids. Renal function also has decreased and now patient has persistent severe anemia also attributable to lenalidomide. Due to the severity of complications, lenalidomide was removed.  Currently, treatment is on hold due to upper respiratory tract infection consistent with a viral illness. Patient is afebrile, he is not neutropenic. He is hemodynamically stable and does not require admission to the hospital.  Plan: --Continue prophylactic acyclovir and low-dose Bactrim to reduce the risk of VZV reactivation and PCP infection respectively --He is receiving aspirin and that should service efficient thromboprophylaxis in the context of lower dose of thalidomide --Proceed with systemic therapy as previously scheduled with weekly treatments as previously planned and change cycles to 4-week intervals --Disease assessment with bone marrow biopsy following completion of the third cycle of systemic therapy: Consult interventional radiology for bone marrow biopsy in  2 weeks --Will likely extend induction component of the treatment out to 6 cycles, depending on the treatment response observed on the bone marrow biopsy --Return to clinic in 3 weeks with labs and possible initiation of the fourth cycle of systemic therapy  Voice recognition software was used and creation of this note. Despite my best effort at editing the text, some misspelling/errors may have occurred.  Orders Placed This Encounter  Procedures  . CT Biopsy    Standing Status:   Future    Number of Occurrences:   1    Standing Expiration Date:   05/25/2018    Order Specific Question:   Lab orders requested (DO NOT place separate lab orders, these will be automatically ordered during procedure specimen collection):    Answer:   Cytology - Non Pap    Comments:   Flow cytometry and Multiple myeloma FISH    Order Specific Question:   Lab orders requested (DO NOT place separate lab orders, these will be automatically ordered during procedure specimen collection):    Answer:   Surgical Pathology    Order Specific Question:   Lab orders requested (DO NOT place separate lab orders, these will be automatically ordered during procedure specimen collection):    Answer:   Other    Order Specific Question:   Reason for Exam (SYMPTOM  OR DIAGNOSIS REQUIRED)    Answer:   MM on therapy, assessment of treatment response    Order Specific Question:   Preferred imaging location?    Answer:   [facility]    Order Specific Question:   Radiology Contrast Protocol - do NOT remove file path    Answer:   [email or web address]  . CT BONE MARROW BIOPSY & ASPIRATION    Standing Status:   Future    Number of Occurrences:  1    Standing Expiration Date:   08/25/2018    Order Specific Question:   Reason for Exam (SYMPTOM  OR DIAGNOSIS REQUIRED)    Answer:   MM on therapy, assessment of treatment response    Order Specific Question:   Preferred imaging location?    Answer:    [facility]    Order Specific Question:   Radiology Contrast Protocol - do NOT remove file path    Answer:   [email or web address]  . CBC with Differential    Standing Status:   Future    Number of Occurrences:   1    Standing Expiration Date:   05/25/2018  . Comprehensive metabolic panel    Standing Status:   Future    Number of Occurrences:   1    Standing Expiration Date:   05/25/2018  . Lactate dehydrogenase (LDH)    Standing Status:   Future    Number of Occurrences:   1    Standing Expiration Date:   05/25/2018  . Magnesium    Standing Status:   Future    Number of Occurrences:   1    Standing Expiration Date:   05/25/2018  . Phosphorus    Standing Status:   Future    Number of Occurrences:   1    Standing Expiration Date:   05/25/2018  . Beta 2 microglobulin    Standing Status:   Future    Number of Occurrences:   1    Standing Expiration Date:   05/25/2018  . Multiple Myeloma Panel (SPEP&IFE w/QIG)    Standing Status:   Future    Number of Occurrences:   1    Standing Expiration Date:   05/25/2018  . Kappa/lambda light chains    Standing Status:   Future    Number of Occurrences:   1    Standing Expiration Date:   05/25/2018    Cancer Staging Multiple myeloma not having achieved remission ([place]) Staging form: Plasma Cell Myeloma and Plasma Cell Disorders, AJCC 8th Edition - Clinical stage from 03/21/2017: RISS Stage I (Beta-2-microglobulin (mg/L): 3.2, Albumin (g/dL): 3.6, ISS: Stage I, High-risk cytogenetics: Absent, LDH: Normal) - Signed by [name], MD on 04/04/2017 - Clinical: No stage assigned - Unsigned   All questions were answered.  . The patient knows to call the clinic with any problems, questions or concerns.  This note was electronically signed.    History of Presenting Illness Robert Sparks is a 71 y.o. male followed in the [facility] for new diagnosis of IgA lambda active multiple myeloma. At  presentation, Robert Sparks reported having some night sweats, but no weight loss. He indicated weakness in bilateral thighs associated with the feet and legs. Complained of constipation, but denies any numbness or tingling in hands and feet. Denied any new musculoskeletal pain. No chest pain, shortness of breath or cough. No nausea, vomiting, abdominal pain, diarrhea, or early satiety. Additional evaluation was conducted resulting in negative imaging including negative skeletal survey on PET/CT. Bone marrow biopsy returned positive with 7% involvement with atypical monoclonal plasma cells. In conjunction with kidney injury, anemia, dysproteinemia with significant elevation of lambda light chains and IgA, this confirmed a diagnosis of active multiple myeloma warranting intervention.   Patient presents to the clinic while receiving systemic therapy with combinational bortezomib and dexamethasone.  Patient reports feeling reasonably well and denies any new symptoms at this time.  Oncological/hematological History: --Labs, 10/28/15: WBC 6.0, Hgb  10.3, Plt 245;   --Labs, 10/02/16: WBC 5.1, Hgb 10.0, Plt 227; ANA -- negative --Labs, 11/10/16: tProt 6.3, Alb 3.4, Ca   9.1, Cr 2.56, AP 82; WBC 4.7, Hgb 8.1, Plt 201; Fe 43, FeSat 16%, TIBC 272, Ferritin 476, Vit B12 199, MMA 283; TSH 1.57 --Labs, 12/25/16: tProt 7.0, Alb 4.1, Ca   9.9, Cr 1.47, AP ...; SPEP -- M-spike 0.8g/dL; kappa 40.7, lambda 305.1, KLR 0.13; WBC 3.4, Hgb 13.2, Plt 154; --Labs, 02/09/17: tProt 8.0, Alb 3.9, Ca 10.1, Cr 1.80, AP 97; SPEP -- M-spike 0.7g.dL, IgA lambda; IgA 1220, IgG 666, IgM 52; kappa 33.5, lambda 433.5, KLR 0.08; WBC 6.3, Hgb 10.6, Plt 194 --Labs, 04/05/17: tProt 7.0, Alb 3.5, Ca   8.9, Cr 2.20, AP 75; SPEP -- M-spike 0.4g/dL, IgA lambda; IgA   433, IgG 683, IgM 52; kappa 59.7, lambda   42.7, KLR 1.40; WBC 5.0, Hgb   7.4, Plt 280;    Multiple myeloma not having achieved remission ([place])   02/16/2017 Imaging    Skeletal Survey:  No definite lytic myelomatous lesions in the axial or appendicular skeleton      02/23/2017 Initial Diagnosis    Multiple myeloma not having achieved remission ([place])      03/09/2017 Imaging    PET-CT: No abnormal hypermetabolic activity or other specific findings of active myeloma. Several adjacent healing left lateral rib fractures.      03/13/2017 Pathology Results    Bone Marrow Bx: HYPERCELLULAR BONE MARROW FOR AGE WITH PLASMA CELL NEOPLASM. TRILINEAGE HEMATOPOIESIS. NORMOCYTIC-NORMOCHROMIA ANEMIA. EOSINOPHILIA. The bone marrow shows slight increase in atypical plasma cells representing 7% of all cells associated with small clusters in the clot and biopsy sections. Immunohistochemical stains highlight the plasma cell component in the bone marrow which shows lambda light chain restriction consistent with plasma cell neoplasm. The background shows trilineage hematopoiesis with non-specific myeloid changes. The peripheral blood shows eosinophilia without an associated increase in eosinophils in the bone marrow. The changes may represent a hypersensitivity reaction. Cytogenetics/FISH: 46,XY -- Positive for +11, +17/17p+ -- extra CCND1 copy in 9% cells, gain of ATM copy in 9% & 11% cells with p53 gain.        03/16/2017 -  Chemotherapy    Induction chemotherapy: Lenalidomide 56m PO Qday, d1-14 + Bortezomib 1.353mm2 IV d1,4,8,11 + dexamethasone 2073mO QWk Q21d --Cycle #1, 09/44/31/54omplicated by grade 2 rash within 1 week of initiation, as well as a aggressive rising creatinine; d11 held due to worsening rash --Cycle #2, 04/13/17: Initiated without lenalidomide due to persistent Grade 3 anemia --Cycle #3, 05/18/17: dosing changed to weekly bortezomib, cycle duration extended to 4 weeks        Medical History: Past Medical History:  Diagnosis Date  . Anemia   . Arthritis    shoulders,feet   . Cataract    per eye dr -has appt 5-11  . CKD (chronic kidney disease), stage III ([place] .  Elevated PSA   . History of ketoacidosis    03-29-2015   . History of sepsis    07-25-2013  non-compliant w/ medication  . Hyperlipidemia   . Hypertension   . Nocturia   . Peripheral neuropathy   . [place]state cancer ([place] . [facility]   Surgical History: Past Surgical History:  Procedure Laterality Date  . CIRCUMCISION  1990's  . PROSTATE BIOPSY N/A 12/21/2015   Procedure: BIOPSY TRANSRECTAL ULTRASONIC PROSTATE (TUBP);  Surgeon:  Festus Aloe, MD;  Location: [facility];  Service: Urology;  Laterality: N/A;    Family History: Family History  Problem Relation Age of Onset  . Cancer Neg Hx   . Colon cancer Neg Hx   . Colon polyps Neg Hx   . Esophageal cancer Neg Hx   . Rectal cancer Neg Hx   . Stomach cancer Neg Hx     Social History: Social History   Socioeconomic History  . Marital status: Single    Spouse name: Not on file  . Number of children: Not on file  . Years of education: Not on file  . Highest education level: Not on file  Social Needs  . Financial resource strain: Not on file  . Food insecurity - worry: Not on file  . Food insecurity - inability: Not on file  . Transportation needs - medical: Not on file  . Transportation needs - non-medical: Not on file  Occupational History  . Not on file  Tobacco Use  . Smoking status: Current Every Day Smoker    Packs/day: 0.25    Years: 50.00    Pack years: 12.50    Types: Cigarettes  . Smokeless tobacco: Never Used  . Tobacco comment: 1 ppwk-4-5 a day   Substance and Sexual Activity  . Alcohol use: Yes    Alcohol/week: 0.6 oz    Types: 1 Cans of beer per week    Comment: 1 every day-quit couple weeks ago  . Drug use: No  . Sexual activity: Not Currently  Other Topics Concern  . Not on file  Social History Narrative  . Not on file    Allergies: No Known Allergies  Medications:  Current Outpatient Medications  Medication Sig Dispense  Refill  . ACCU-CHEK AVIVA PLUS test strip See admin instructions.  12  . acyclovir (ZOVIRAX) 400 MG tablet Take 1 tablet (400 mg total) by mouth 2 (two) times daily. 60 tablet 3  . amLODipine (NORVASC) 5 MG tablet Take 1 tablet (5 mg total) by mouth daily. 90 tablet 3  . Ascorbic Acid (VITAMIN C) 1000 MG tablet Take 1,000 mg by mouth daily.    [name] aspirin EC 81 MG tablet Take 1 tablet (81 mg total) by mouth daily. 90 tablet 3  . Cholecalciferol (VITAMIN D) 2000 units CAPS Take 2,000 Units by mouth daily.    . cyanocobalamin (V-R VITAMIN B-12) 500 MCG tablet Take 1 tablet (500 mcg total) by mouth 2 (two) times daily. 120 tablet 6  . dexamethasone (DECADRON) 4 MG tablet TAKE 5 TABLETS BY MOUTH ONCE A WEEK. 15 tablet 0  . ferrous sulfate 325 (65 FE) MG tablet Take 325 mg by mouth daily with breakfast.    . folic acid (FOLVITE) 1 MG tablet Take 1 tablet (1 mg total) by mouth daily. 60 tablet 0  . gabapentin (NEURONTIN) 300 MG capsule Take 1 capsule (300 mg total) by mouth 4 (four) times daily. Take 1 tab po am and lunch, 2 tabs at night. 120 capsule 2  . insulin aspart protamine - aspart (NOVOLOG 70/30 MIX) (70-30) 100 UNIT/ML FlexPen Inject 0.05 mLs (5 Units total) into the skin 2 (two) times daily with a meal. (Patient taking differently: Inject 5 Units into the skin 2 (two) times daily with a meal. ) 6 mL 2  . lidocaine-prilocaine (EMLA) cream Apply to affected area once 30 g 3  . LORazepam (ATIVAN) 0.5 MG tablet Take 1 tablet (0.5 mg total) by mouth  every 6 (six) hours as needed (Nausea or vomiting). 30 tablet 0  . mometasone (ELOCON) 0.1 % cream Apply 1 application topically daily. 45 g 3  . Multiple Vitamin (MULTIVITAMIN WITH MINERALS) TABS tablet Take 1 tablet by mouth daily. 60 tablet 2  . nicotine (NICODERM CQ - DOSED IN MG/24 HOURS) 14 mg/24hr patch Place 1 patch (14 mg total) onto the skin daily. 30 patch 0  . nicotine (NICODERM CQ - DOSED IN MG/24 HR) 7 mg/24hr patch Place 1 patch (7 mg  total) onto the skin daily. 28 patch 0  . ondansetron (ZOFRAN) 8 MG tablet Take 1 tablet (8 mg total) by mouth 2 (two) times daily as needed (Nausea or vomiting). 30 tablet 1  . potassium chloride (K-DUR) 10 MEQ tablet TAKE 1 TABLET BY MOUTH DAILY. 7 tablet 0  . pravastatin (PRAVACHOL) 20 MG tablet Take 1 tablet (20 mg total) by mouth daily. 90 tablet 3  . prochlorperazine (COMPAZINE) 10 MG tablet Take 1 tablet (10 mg total) by mouth every 6 (six) hours as needed (Nausea or vomiting). 30 tablet 1  . senna-docusate (SENOKOT-S) 8.6-50 MG tablet Take 2 tablets by mouth 2 (two) times daily. 60 tablet 2  . sulfamethoxazole-trimethoprim (BACTRIM) 400-80 MG tablet Take 1 tablet by mouth daily. 21 tablet 1  . thiamine 100 MG tablet Take 1 tablet (100 mg total) by mouth daily. 60 tablet 0   Current Facility-Administered Medications  Medication Dose Route Frequency Provider Last Rate Last Dose  . 0.9 %  sodium chloride infusion  500 mL Intravenous Continuous Pyrtle, Lajuan Lines, MD        Review of Systems: Review of Systems  Constitutional: Positive for fatigue. Negative for diaphoresis and unexpected weight change.  HENT:   Positive for sore throat. Negative for trouble swallowing and voice change.   Eyes: Negative.   Respiratory: Positive for cough.   Cardiovascular: Negative.   Gastrointestinal: Positive for constipation.  Endocrine: Negative.   Genitourinary: Negative.    Skin: Negative for itching and rash.  Neurological: Positive for extremity weakness.  Hematological: Negative.   Psychiatric/Behavioral: Negative.      PHYSICAL EXAMINATION Blood pressure (!) 103/54, pulse 81, temperature 97.6 F (36.4 C), temperature source Oral, resp. rate 18, height '5\' 4"'  (1.626 m), weight 153 lb 14.4 oz (69.8 kg), SpO2 100 %.  ECOG PERFORMANCE STATUS: 2 - Symptomatic, <50% confined to bed  Physical Exam  Constitutional: He is oriented to person, place, and time and well-developed, well-nourished, and  in no distress. No distress.  HENT:  Head: Normocephalic and atraumatic.  Patient still has some of his lower front teeth left. These appear to be in good health without evidence of infection. Dissemination of the oral cavity also reveals presence of a white patch over the posterior left lower gum without ulceration or bleeding. No other areas of abnormalities noted.  Eyes: Conjunctivae and EOM are normal. Pupils are equal, round, and reactive to light. No scleral icterus.  Neck: No thyromegaly present.  Cardiovascular: Normal rate and regular rhythm.  No murmur heard. Pulmonary/Chest: Effort normal and breath sounds normal. No respiratory distress. He has no wheezes.  Abdominal: Soft. He exhibits no distension and no mass. There is no tenderness.  Musculoskeletal: He exhibits no edema or tenderness.  Lymphadenopathy:    He has no cervical adenopathy.  Neurological: He is alert and oriented to person, place, and time. He has normal reflexes. No cranial nerve deficit.  Skin: Skin is warm and dry. No  rash noted. He is not diaphoretic. No erythema.  Interval near complete resolution of the cutaneous rash.     LABORATORY DATA: I have personally reviewed the data as listed: Appointment on 05/22/2017  Component Date Value Ref Range Status  . Phosphorus, Ser 05/22/2017 4.1  2.5 - 4.5 mg/dL Final  . Magnesium 05/22/2017 2.9* 1.5 - 2.5 mg/dl Final  . Sodium 05/22/2017 137  136 - 145 mEq/L Final  . Potassium 05/22/2017 3.6  3.5 - 5.1 mEq/L Final  . Chloride 05/22/2017 106  98 - 109 mEq/L Final  . CO2 05/22/2017 22  22 - 29 mEq/L Final  . Glucose 05/22/2017 177* 70 - 140 mg/dl Final   Glucose reference range is for nonfasting patients. Fasting glucose reference range is 70- 100.  [name] BUN 05/22/2017 23.4  7.0 - 26.0 mg/dL Final  . Creatinine 05/22/2017 1.8* 0.7 - 1.3 mg/dL Final  . Total Bilirubin 05/22/2017 0.39  0.20 - 1.20 mg/dL Final  . Alkaline Phosphatase 05/22/2017 87  40 - 150 U/L Final   . AST 05/22/2017 14  5 - 34 U/L Final  . ALT 05/22/2017 14  0 - 55 U/L Final  . Total Protein 05/22/2017 6.8  6.4 - 8.3 g/dL Final  . Albumin 05/22/2017 3.7  3.5 - 5.0 g/dL Final  . Calcium 05/22/2017 9.2  8.4 - 10.4 mg/dL Final  . Anion Gap 05/22/2017 9  3 - 11 mEq/L Final  . EGFR 05/22/2017 43* >60 ml/min/1.73 m2 Final   eGFR is calculated using the CKD-EPI Creatinine Equation (2009)  . WBC 05/22/2017 3.8* 4.0 - 10.3 10e3/uL Final  . NEUT# 05/22/2017 2.4  1.5 - 6.5 10e3/uL Final  . HGB 05/22/2017 8.9* 13.0 - 17.1 g/dL Final  . HCT 05/22/2017 26.9* 38.4 - 49.9 % Final  . Platelets 05/22/2017 223  140 - 400 10e3/uL Final  . MCV 05/22/2017 97.7  79.3 - 98.0 fL Final  . MCH 05/22/2017 32.2  27.2 - 33.4 pg Final  . MCHC 05/22/2017 32.9  32.0 - 36.0 g/dL Final  . RBC 05/22/2017 2.76* 4.20 - 5.82 10e6/uL Final  . RDW 05/22/2017 16.5* 11.0 - 14.6 % Final  . lymph# 05/22/2017 0.8* 0.9 - 3.3 10e3/uL Final  . MONO# 05/22/2017 0.4  0.1 - 0.9 10e3/uL Final  . Eosinophils Absolute 05/22/2017 0.3  0.0 - 0.5 10e3/uL Final  . Basophils Absolute 05/22/2017 0.0  0.0 - 0.1 10e3/uL Final  . NEUT% 05/22/2017 62.1  39.0 - 75.0 % Final  . LYMPH% 05/22/2017 19.6  14.0 - 49.0 % Final  . MONO% 05/22/2017 9.7  0.0 - 14.0 % Final  . EOS% 05/22/2017 8.0* 0.0 - 7.0 % Final  . BASO% 05/22/2017 0.6  0.0 - 2.0 % Final       [name], MD

## 2017-06-13 NOTE — Assessment & Plan Note (Signed)
71 y.o. male with diagnosis of IgA lambda active multiple myeloma, ISS Stage I. Active disease diagnosed based on presence of anemia, kidney insufficiency, and paraproteinemia with significant predominance of lambda light chains as well as significant elevation of IgA. Bone marrow biopsy confirms presence of atypical monoclonal plasma cell process in the bone marrow comprising at least 7% of the cellularity.  Based on the findings, patient was started on treatment with lenalidomide, bortezomib, and low-dose dexamethasone based on the anticipated tolerance by the patient. Lenalidomide was started at 10 mg daily based on creatinine clearance of 42, dexamethasone dose was reduced to 20 mg weekly based on patient's age. Treatment was complicated by rapid development of cutaneous rash attributed to lenalidomide. Rash responded to topical steroids. Renal function also has decreased and now patient has persistent severe anemia also attributable to lenalidomide. Due to the severity of complications, lenalidomide was removed.  Currently, treatment is on hold due to upper respiratory tract infection consistent with a viral illness. Patient is afebrile, he is not neutropenic. He is hemodynamically stable and does not require admission to the hospital.  Plan: --Continue prophylactic acyclovir and low-dose Bactrim to reduce the risk of VZV reactivation and PCP infection respectively --He is receiving aspirin and that should service efficient thromboprophylaxis in the context of lower dose of thalidomide --Proceed with systemic therapy as previously scheduled with weekly treatments as previously planned and change cycles to 4-week intervals --Disease assessment with bone marrow biopsy following completion of the third cycle of systemic therapy: Consult interventional radiology for bone marrow biopsy in 2 weeks --Will likely extend induction component of the treatment out to 6 cycles, depending on the treatment response  observed on the bone marrow biopsy --Return to clinic in 3 weeks with labs and possible initiation of the fourth cycle of systemic therapy

## 2017-06-14 LAB — BETA 2 MICROGLOBULIN, SERUM: BETA 2: 3.3 mg/L — AB (ref 0.6–2.4)

## 2017-06-14 LAB — KAPPA/LAMBDA LIGHT CHAINS
IG KAPPA FREE LIGHT CHAIN: 50.7 mg/L — AB (ref 3.3–19.4)
IG LAMBDA FREE LIGHT CHAIN: 19.3 mg/L (ref 5.7–26.3)
KAPPA/LAMBDA FLC RATIO: 2.63 — AB (ref 0.26–1.65)

## 2017-06-14 LAB — PHOSPHORUS: Phosphorus, Ser: 4.1 mg/dL (ref 2.5–4.5)

## 2017-06-15 ENCOUNTER — Encounter: Payer: Self-pay | Admitting: Hematology and Oncology

## 2017-06-15 ENCOUNTER — Ambulatory Visit (HOSPITAL_BASED_OUTPATIENT_CLINIC_OR_DEPARTMENT_OTHER): Payer: Medicare HMO | Admitting: Hematology and Oncology

## 2017-06-15 ENCOUNTER — Ambulatory Visit (HOSPITAL_BASED_OUTPATIENT_CLINIC_OR_DEPARTMENT_OTHER): Payer: Medicare HMO

## 2017-06-15 ENCOUNTER — Telehealth: Payer: Self-pay | Admitting: Hematology and Oncology

## 2017-06-15 VITALS — BP 146/85 | HR 93 | Temp 98.5°F | Resp 19 | Ht 64.0 in | Wt 157.2 lb

## 2017-06-15 DIAGNOSIS — C9000 Multiple myeloma not having achieved remission: Secondary | ICD-10-CM

## 2017-06-15 DIAGNOSIS — Z5112 Encounter for antineoplastic immunotherapy: Secondary | ICD-10-CM

## 2017-06-15 DIAGNOSIS — C9001 Multiple myeloma in remission: Secondary | ICD-10-CM

## 2017-06-15 LAB — MULTIPLE MYELOMA PANEL, SERUM
ALPHA 1: 0.2 g/dL (ref 0.0–0.4)
ALPHA2 GLOB SERPL ELPH-MCNC: 0.8 g/dL (ref 0.4–1.0)
Albumin SerPl Elph-Mcnc: 3.4 g/dL (ref 2.9–4.4)
Albumin/Glob SerPl: 1.4 (ref 0.7–1.7)
B-Globulin SerPl Elph-Mcnc: 0.9 g/dL (ref 0.7–1.3)
Gamma Glob SerPl Elph-Mcnc: 0.7 g/dL (ref 0.4–1.8)
Globulin, Total: 2.6 g/dL (ref 2.2–3.9)
IGM (IMMUNOGLOBIN M), SRM: 47 mg/dL (ref 20–172)
IgA, Qn, Serum: 105 mg/dL (ref 61–437)
IgG, Qn, Serum: 688 mg/dL — ABNORMAL LOW (ref 700–1600)
TOTAL PROTEIN: 6 g/dL (ref 6.0–8.5)

## 2017-06-15 MED ORDER — BORTEZOMIB CHEMO SQ INJECTION 3.5 MG (2.5MG/ML)
1.3000 mg/m2 | Freq: Once | INTRAMUSCULAR | Status: AC
  Administered 2017-06-15: 2.25 mg via SUBCUTANEOUS
  Filled 2017-06-15: qty 2.25

## 2017-06-15 MED ORDER — PROCHLORPERAZINE MALEATE 10 MG PO TABS
ORAL_TABLET | ORAL | Status: AC
  Filled 2017-06-15: qty 1

## 2017-06-15 MED ORDER — PROCHLORPERAZINE MALEATE 10 MG PO TABS
10.0000 mg | ORAL_TABLET | Freq: Once | ORAL | Status: AC
  Administered 2017-06-15: 10 mg via ORAL

## 2017-06-15 NOTE — Telephone Encounter (Signed)
Scheduled appt per 12/7 los - Gave patient AVS and calender per los.  

## 2017-06-15 NOTE — Patient Instructions (Signed)
[facility] Discharge Instructions for Patients Receiving Chemotherapy  Today you received the following chemotherapy agents Velcade To help prevent nausea and vomiting after your treatment, we encourage you to take your nausea medication as prescribed.   If you develop nausea and vomiting that is not controlled by your nausea medication, call the clinic.   BELOW ARE SYMPTOMS THAT SHOULD BE REPORTED IMMEDIATELY:  *FEVER GREATER THAN 100.5 F  *CHILLS WITH OR WITHOUT FEVER  NAUSEA AND VOMITING THAT IS NOT CONTROLLED WITH YOUR NAUSEA MEDICATION  *UNUSUAL SHORTNESS OF BREATH  *UNUSUAL BRUISING OR BLEEDING  TENDERNESS IN MOUTH AND THROAT WITH OR WITHOUT PRESENCE OF ULCERS  *URINARY PROBLEMS  *BOWEL PROBLEMS  UNUSUAL RASH Items with * indicate a potential emergency and should be followed up as soon as possible.  Feel free to call the clinic should you have any questions or concerns. The clinic phone number is [phone].  Please show the CHEMO ALERT CARD at check-in to the Emergency Department and triage nurse.   

## 2017-06-20 ENCOUNTER — Other Ambulatory Visit: Payer: Self-pay | Admitting: Internal Medicine

## 2017-06-20 ENCOUNTER — Other Ambulatory Visit: Payer: Self-pay | Admitting: Hematology and Oncology

## 2017-06-20 DIAGNOSIS — C9000 Multiple myeloma not having achieved remission: Secondary | ICD-10-CM

## 2017-06-20 MED FILL — GABAPENTIN 300 MG CAPSULE: 300 | 30 days supply | Qty: 120 | Fill #0

## 2017-06-22 ENCOUNTER — Other Ambulatory Visit (HOSPITAL_BASED_OUTPATIENT_CLINIC_OR_DEPARTMENT_OTHER): Payer: Medicare HMO

## 2017-06-22 ENCOUNTER — Ambulatory Visit (HOSPITAL_BASED_OUTPATIENT_CLINIC_OR_DEPARTMENT_OTHER): Payer: Medicare HMO

## 2017-06-22 ENCOUNTER — Other Ambulatory Visit: Payer: Self-pay

## 2017-06-22 VITALS — BP 147/81 | HR 75 | Temp 98.5°F | Resp 18

## 2017-06-22 DIAGNOSIS — C9000 Multiple myeloma not having achieved remission: Secondary | ICD-10-CM | POA: Diagnosis not present

## 2017-06-22 DIAGNOSIS — Z5112 Encounter for antineoplastic immunotherapy: Secondary | ICD-10-CM

## 2017-06-22 LAB — COMPREHENSIVE METABOLIC PANEL
ALBUMIN: 3.6 g/dL (ref 3.5–5.0)
ALK PHOS: 72 U/L (ref 40–150)
ALT: 18 U/L (ref 0–55)
AST: 17 U/L (ref 5–34)
Anion Gap: 11 mEq/L (ref 3–11)
BUN: 25.9 mg/dL (ref 7.0–26.0)
CO2: 22 meq/L (ref 22–29)
Calcium: 8.9 mg/dL (ref 8.4–10.4)
Chloride: 108 mEq/L (ref 98–109)
Creatinine: 1.7 mg/dL — ABNORMAL HIGH (ref 0.7–1.3)
EGFR: 46 mL/min/{1.73_m2} — ABNORMAL LOW (ref >60)
GLUCOSE: 177 mg/dL — AB (ref 70–140)
POTASSIUM: 3.5 meq/L (ref 3.5–5.1)
SODIUM: 141 meq/L (ref 136–145)
Total Bilirubin: 0.36 mg/dL (ref 0.20–1.20)
Total Protein: 6.5 g/dL (ref 6.4–8.3)

## 2017-06-22 LAB — CBC WITH DIFFERENTIAL/PLATELET
BASO%: 0.3 % (ref 0.0–2.0)
Basophils Absolute: 0 10*3/uL (ref 0.0–0.1)
EOS ABS: 0.3 10*3/uL (ref 0.0–0.5)
EOS%: 6.8 % (ref 0.0–7.0)
HEMATOCRIT: 29.4 % — AB (ref 38.4–49.9)
HGB: 9.5 g/dL — ABNORMAL LOW (ref 13.0–17.1)
LYMPH#: 0.8 10*3/uL — AB (ref 0.9–3.3)
LYMPH%: 22.7 % (ref 14.0–49.0)
MCH: 31 pg (ref 27.2–33.4)
MCHC: 32.3 g/dL (ref 32.0–36.0)
MCV: 96.1 fL (ref 79.3–98.0)
MONO#: 0.3 10*3/uL (ref 0.1–0.9)
MONO%: 7.7 % (ref 0.0–14.0)
NEUT#: 2.3 10*3/uL (ref 1.5–6.5)
NEUT%: 62.5 % (ref 39.0–75.0)
PLATELETS: 232 10*3/uL (ref 140–400)
RBC: 3.06 10*6/uL — ABNORMAL LOW (ref 4.20–5.82)
RDW: 14.3 % (ref 11.0–14.6)
WBC: 3.7 10*3/uL — ABNORMAL LOW (ref 4.0–10.3)

## 2017-06-22 MED ORDER — PROCHLORPERAZINE MALEATE 10 MG PO TABS
ORAL_TABLET | ORAL | Status: AC
  Filled 2017-06-22: qty 1

## 2017-06-22 MED ORDER — PROCHLORPERAZINE MALEATE 10 MG PO TABS
10.0000 mg | ORAL_TABLET | Freq: Once | ORAL | Status: AC
  Administered 2017-06-22: 10 mg via ORAL

## 2017-06-22 MED ORDER — BORTEZOMIB CHEMO SQ INJECTION 3.5 MG (2.5MG/ML)
1.3000 mg/m2 | Freq: Once | INTRAMUSCULAR | Status: AC
  Administered 2017-06-22: 2.25 mg via SUBCUTANEOUS
  Filled 2017-06-22: qty 2.25

## 2017-06-22 NOTE — Patient Instructions (Signed)
[facility] Discharge Instructions for Patients Receiving Chemotherapy  Today you received the following chemotherapy agents Velcade To help prevent nausea and vomiting after your treatment, we encourage you to take your nausea medication as prescribed.   If you develop nausea and vomiting that is not controlled by your nausea medication, call the clinic.   BELOW ARE SYMPTOMS THAT SHOULD BE REPORTED IMMEDIATELY:  *FEVER GREATER THAN 100.5 F  *CHILLS WITH OR WITHOUT FEVER  NAUSEA AND VOMITING THAT IS NOT CONTROLLED WITH YOUR NAUSEA MEDICATION  *UNUSUAL SHORTNESS OF BREATH  *UNUSUAL BRUISING OR BLEEDING  TENDERNESS IN MOUTH AND THROAT WITH OR WITHOUT PRESENCE OF ULCERS  *URINARY PROBLEMS  *BOWEL PROBLEMS  UNUSUAL RASH Items with * indicate a potential emergency and should be followed up as soon as possible.  Feel free to call the clinic should you have any questions or concerns. The clinic phone number is [phone].  Please show the CHEMO ALERT CARD at check-in to the Emergency Department and triage nurse.   

## 2017-06-22 NOTE — Progress Notes (Signed)
Ok to treat with labs today Creatine at 1.7 Velcade only Zometa not needed today per Dr. Irene Limbo

## 2017-06-22 NOTE — Progress Notes (Signed)
Per Dr. Irene Limbo, ok to receive Velcade with creatinine of 1.7.

## 2017-06-28 ENCOUNTER — Other Ambulatory Visit: Payer: Self-pay | Admitting: Hematology and Oncology

## 2017-06-28 ENCOUNTER — Encounter (HOSPITAL_COMMUNITY): Payer: Self-pay

## 2017-06-28 DIAGNOSIS — C9000 Multiple myeloma not having achieved remission: Secondary | ICD-10-CM

## 2017-06-28 LAB — CHROMOSOME ANALYSIS, BONE MARROW

## 2017-06-29 ENCOUNTER — Other Ambulatory Visit (HOSPITAL_BASED_OUTPATIENT_CLINIC_OR_DEPARTMENT_OTHER): Payer: Medicare HMO

## 2017-06-29 ENCOUNTER — Ambulatory Visit (HOSPITAL_BASED_OUTPATIENT_CLINIC_OR_DEPARTMENT_OTHER): Payer: Medicare HMO

## 2017-06-29 DIAGNOSIS — Z5112 Encounter for antineoplastic immunotherapy: Secondary | ICD-10-CM

## 2017-06-29 DIAGNOSIS — C9000 Multiple myeloma not having achieved remission: Secondary | ICD-10-CM

## 2017-06-29 LAB — CBC WITH DIFFERENTIAL/PLATELET
BASO%: 0.8 % (ref 0.0–2.0)
BASOS ABS: 0 10*3/uL (ref 0.0–0.1)
EOS ABS: 0.3 10*3/uL (ref 0.0–0.5)
EOS%: 5.6 % (ref 0.0–7.0)
HEMATOCRIT: 28.8 % — AB (ref 38.4–49.9)
HEMOGLOBIN: 9.6 g/dL — AB (ref 13.0–17.1)
LYMPH%: 16.2 % (ref 14.0–49.0)
MCH: 32.1 pg (ref 27.2–33.4)
MCHC: 33.5 g/dL (ref 32.0–36.0)
MCV: 95.9 fL (ref 79.3–98.0)
MONO#: 0.5 10*3/uL (ref 0.1–0.9)
MONO%: 9.3 % (ref 0.0–14.0)
NEUT#: 3.6 10*3/uL (ref 1.5–6.5)
NEUT%: 68.1 % (ref 39.0–75.0)
PLATELETS: 247 10*3/uL (ref 140–400)
RBC: 3 10*6/uL — ABNORMAL LOW (ref 4.20–5.82)
RDW: 15.4 % — AB (ref 11.0–14.6)
WBC: 5.3 10*3/uL (ref 4.0–10.3)
lymph#: 0.9 10*3/uL (ref 0.9–3.3)

## 2017-06-29 LAB — BASIC METABOLIC PANEL
Anion Gap: 10 mEq/L (ref 3–11)
BUN: 33.8 mg/dL — ABNORMAL HIGH (ref 7.0–26.0)
CALCIUM: 9 mg/dL (ref 8.4–10.4)
CO2: 20 meq/L — AB (ref 22–29)
CREATININE: 1.7 mg/dL — AB (ref 0.7–1.3)
Chloride: 106 mEq/L (ref 98–109)
EGFR: 46 mL/min/{1.73_m2} — ABNORMAL LOW (ref >60)
Glucose: 227 mg/dl — ABNORMAL HIGH (ref 70–140)
Potassium: 3.6 mEq/L (ref 3.5–5.1)
SODIUM: 136 meq/L (ref 136–145)

## 2017-06-29 MED ORDER — PROCHLORPERAZINE MALEATE 10 MG PO TABS
10.0000 mg | ORAL_TABLET | Freq: Once | ORAL | Status: AC
  Administered 2017-06-29: 10 mg via ORAL

## 2017-06-29 MED ORDER — BORTEZOMIB CHEMO SQ INJECTION 3.5 MG (2.5MG/ML)
1.3000 mg/m2 | Freq: Once | INTRAMUSCULAR | Status: AC
  Administered 2017-06-29: 2.25 mg via SUBCUTANEOUS
  Filled 2017-06-29: qty 2.25

## 2017-06-29 MED ORDER — PROCHLORPERAZINE MALEATE 10 MG PO TABS
ORAL_TABLET | ORAL | Status: AC
  Filled 2017-06-29: qty 1

## 2017-06-29 NOTE — Progress Notes (Signed)
Okay to treat today with elevated creatinine per Dr Irene Limbo.

## 2017-06-29 NOTE — Patient Instructions (Signed)
[facility] Discharge Instructions for Patients Receiving Chemotherapy  Today you received the following chemotherapy agents Velcade.  To help prevent nausea and vomiting after your treatment, we encourage you to take your nausea medication as directed.  If you develop nausea and vomiting that is not controlled by your nausea medication, call the clinic.   BELOW ARE SYMPTOMS THAT SHOULD BE REPORTED IMMEDIATELY:  *FEVER GREATER THAN 100.5 F  *CHILLS WITH OR WITHOUT FEVER  NAUSEA AND VOMITING THAT IS NOT CONTROLLED WITH YOUR NAUSEA MEDICATION  *UNUSUAL SHORTNESS OF BREATH  *UNUSUAL BRUISING OR BLEEDING  TENDERNESS IN MOUTH AND THROAT WITH OR WITHOUT PRESENCE OF ULCERS  *URINARY PROBLEMS  *BOWEL PROBLEMS  UNUSUAL RASH Items with * indicate a potential emergency and should be followed up as soon as possible.  Feel free to call the clinic should you have any questions or concerns. The clinic phone number is [phone].  Please show the CHEMO ALERT CARD at check-in to the Emergency Department and triage nurse.   

## 2017-07-04 MED FILL — ACCU-CHEK AVIVA PLUS TEST S: 30 days supply | Qty: 100 | Fill #12

## 2017-07-04 MED FILL — NOVOLOG MIX 70-30 FLEXPEN S: (70-30) 100 | 28 days supply | Qty: 3 | Fill #3

## 2017-07-06 ENCOUNTER — Ambulatory Visit (HOSPITAL_BASED_OUTPATIENT_CLINIC_OR_DEPARTMENT_OTHER): Payer: Medicare HMO

## 2017-07-06 ENCOUNTER — Telehealth: Payer: Self-pay

## 2017-07-06 ENCOUNTER — Other Ambulatory Visit (HOSPITAL_BASED_OUTPATIENT_CLINIC_OR_DEPARTMENT_OTHER): Payer: Medicare HMO

## 2017-07-06 VITALS — BP 135/76 | HR 81 | Temp 98.1°F | Resp 17

## 2017-07-06 DIAGNOSIS — Z5112 Encounter for antineoplastic immunotherapy: Secondary | ICD-10-CM

## 2017-07-06 DIAGNOSIS — C9000 Multiple myeloma not having achieved remission: Secondary | ICD-10-CM

## 2017-07-06 LAB — BASIC METABOLIC PANEL
Anion Gap: 9 mEq/L (ref 3–11)
BUN: 25.9 mg/dL (ref 7.0–26.0)
CALCIUM: 9.6 mg/dL (ref 8.4–10.4)
CHLORIDE: 104 meq/L (ref 98–109)
CO2: 27 meq/L (ref 22–29)
Creatinine: 1.8 mg/dL — ABNORMAL HIGH (ref 0.7–1.3)
EGFR: 44 mL/min/{1.73_m2} — AB (ref >60)
GLUCOSE: 158 mg/dL — AB (ref 70–140)
POTASSIUM: 3.3 meq/L — AB (ref 3.5–5.1)
SODIUM: 139 meq/L (ref 136–145)

## 2017-07-06 MED ORDER — BORTEZOMIB CHEMO SQ INJECTION 3.5 MG (2.5MG/ML)
1.3000 mg/m2 | Freq: Once | INTRAMUSCULAR | Status: AC
  Administered 2017-07-06: 2.25 mg via SUBCUTANEOUS
  Filled 2017-07-06: qty 2.25

## 2017-07-06 MED ORDER — PROCHLORPERAZINE MALEATE 10 MG PO TABS
ORAL_TABLET | ORAL | Status: AC
  Filled 2017-07-06: qty 1

## 2017-07-06 MED ORDER — PROCHLORPERAZINE MALEATE 10 MG PO TABS
10.0000 mg | ORAL_TABLET | Freq: Once | ORAL | Status: AC
  Administered 2017-07-06: 10 mg via ORAL

## 2017-07-06 MED ORDER — DEXAMETHASONE 4 MG PO TABS
10.0000 mg | ORAL_TABLET | Freq: Once | ORAL | Status: AC
  Administered 2017-07-06: 10 mg via ORAL

## 2017-07-06 MED ORDER — DEXAMETHASONE 4 MG PO TABS
ORAL_TABLET | ORAL | Status: AC
  Filled 2017-07-06: qty 3

## 2017-07-06 NOTE — Telephone Encounter (Signed)
Ok to treat with Creatinine of 1.8 per Dr. Lindi Adie and without CBC.  Cyndia Bent RN

## 2017-07-06 NOTE — Patient Instructions (Signed)
[facility] Discharge Instructions for Patients Receiving Chemotherapy  Today you received the following chemotherapy agents Velcade.  To help prevent nausea and vomiting after your treatment, we encourage you to take your nausea medication as directed.  If you develop nausea and vomiting that is not controlled by your nausea medication, call the clinic.   BELOW ARE SYMPTOMS THAT SHOULD BE REPORTED IMMEDIATELY:  *FEVER GREATER THAN 100.5 F  *CHILLS WITH OR WITHOUT FEVER  NAUSEA AND VOMITING THAT IS NOT CONTROLLED WITH YOUR NAUSEA MEDICATION  *UNUSUAL SHORTNESS OF BREATH  *UNUSUAL BRUISING OR BLEEDING  TENDERNESS IN MOUTH AND THROAT WITH OR WITHOUT PRESENCE OF ULCERS  *URINARY PROBLEMS  *BOWEL PROBLEMS  UNUSUAL RASH Items with * indicate a potential emergency and should be followed up as soon as possible.  Feel free to call the clinic should you have any questions or concerns. The clinic phone number is [phone].  Please show the CHEMO ALERT CARD at check-in to the Emergency Department and triage nurse.   

## 2017-07-12 MED FILL — DEXAMETHASONE 4 MG TABLET: 4 | 21 days supply | Qty: 15 | Fill #0

## 2017-07-13 ENCOUNTER — Encounter: Payer: Self-pay | Admitting: Hematology and Oncology

## 2017-07-13 ENCOUNTER — Telehealth: Payer: Self-pay | Admitting: Hematology and Oncology

## 2017-07-13 ENCOUNTER — Ambulatory Visit (HOSPITAL_BASED_OUTPATIENT_CLINIC_OR_DEPARTMENT_OTHER): Payer: Medicare HMO

## 2017-07-13 ENCOUNTER — Other Ambulatory Visit (HOSPITAL_BASED_OUTPATIENT_CLINIC_OR_DEPARTMENT_OTHER): Payer: Medicare HMO

## 2017-07-13 ENCOUNTER — Ambulatory Visit (HOSPITAL_BASED_OUTPATIENT_CLINIC_OR_DEPARTMENT_OTHER): Payer: Medicare HMO | Admitting: Hematology and Oncology

## 2017-07-13 VITALS — BP 133/72 | HR 85 | Temp 98.3°F | Resp 18 | Ht 64.0 in | Wt 161.3 lb

## 2017-07-13 DIAGNOSIS — Z5111 Encounter for antineoplastic chemotherapy: Secondary | ICD-10-CM

## 2017-07-13 DIAGNOSIS — C9000 Multiple myeloma not having achieved remission: Secondary | ICD-10-CM | POA: Diagnosis not present

## 2017-07-13 DIAGNOSIS — C9001 Multiple myeloma in remission: Secondary | ICD-10-CM

## 2017-07-13 DIAGNOSIS — Z5112 Encounter for antineoplastic immunotherapy: Secondary | ICD-10-CM

## 2017-07-13 LAB — CBC WITH DIFFERENTIAL/PLATELET
BASO%: 0.5 % (ref 0.0–2.0)
Basophils Absolute: 0 10*3/uL (ref 0.0–0.1)
EOS ABS: 0.5 10*3/uL (ref 0.0–0.5)
EOS%: 11.2 % — ABNORMAL HIGH (ref 0.0–7.0)
HEMATOCRIT: 30.6 % — AB (ref 38.4–49.9)
HEMOGLOBIN: 10.1 g/dL — AB (ref 13.0–17.1)
LYMPH#: 0.8 10*3/uL — AB (ref 0.9–3.3)
LYMPH%: 19.8 % (ref 14.0–49.0)
MCH: 31.8 pg (ref 27.2–33.4)
MCHC: 33 g/dL (ref 32.0–36.0)
MCV: 96.2 fL (ref 79.3–98.0)
MONO#: 0.4 10*3/uL (ref 0.1–0.9)
MONO%: 10.5 % (ref 0.0–14.0)
NEUT#: 2.4 10*3/uL (ref 1.5–6.5)
NEUT%: 58 % (ref 39.0–75.0)
PLATELETS: 198 10*3/uL (ref 140–400)
RBC: 3.18 10*6/uL — ABNORMAL LOW (ref 4.20–5.82)
RDW: 14.4 % (ref 11.0–14.6)
WBC: 4.2 10*3/uL (ref 4.0–10.3)

## 2017-07-13 LAB — COMPREHENSIVE METABOLIC PANEL
ALBUMIN: 3.8 g/dL (ref 3.5–5.0)
ALK PHOS: 119 U/L (ref 40–150)
ALT: 13 U/L (ref 0–55)
ANION GAP: 8 meq/L (ref 3–11)
AST: 14 U/L (ref 5–34)
BILIRUBIN TOTAL: 0.45 mg/dL (ref 0.20–1.20)
BUN: 23.8 mg/dL (ref 7.0–26.0)
CALCIUM: 9.3 mg/dL (ref 8.4–10.4)
CO2: 25 mEq/L (ref 22–29)
CREATININE: 1.7 mg/dL — AB (ref 0.7–1.3)
Chloride: 104 mEq/L (ref 98–109)
EGFR: 47 mL/min/{1.73_m2} — AB (ref >60)
Glucose: 148 mg/dl — ABNORMAL HIGH (ref 70–140)
Potassium: 3.3 mEq/L — ABNORMAL LOW (ref 3.5–5.1)
Sodium: 137 mEq/L (ref 136–145)
TOTAL PROTEIN: 6.8 g/dL (ref 6.4–8.3)

## 2017-07-13 LAB — MAGNESIUM: Magnesium: 2.5 mg/dl (ref 1.5–2.5)

## 2017-07-13 MED ORDER — BORTEZOMIB CHEMO SQ INJECTION 3.5 MG (2.5MG/ML)
1.3000 mg/m2 | Freq: Once | INTRAMUSCULAR | Status: AC
  Administered 2017-07-13: 2.25 mg via SUBCUTANEOUS
  Filled 2017-07-13: qty 2.25

## 2017-07-13 MED ORDER — DEXAMETHASONE 4 MG PO TABS: 10.0000 mg | ORAL_TABLET | ORAL | 0 refills | Status: AC

## 2017-07-13 MED ORDER — PROCHLORPERAZINE MALEATE 10 MG PO TABS
10.0000 mg | ORAL_TABLET | Freq: Once | ORAL | Status: AC
  Administered 2017-07-13: 10 mg via ORAL

## 2017-07-13 MED ORDER — POTASSIUM CHLORIDE ER 10 MEQ PO TBCR: 20.0000 meq | EXTENDED_RELEASE_TABLET | Freq: Every day | ORAL | 0 refills | Status: DC

## 2017-07-13 MED ORDER — PROCHLORPERAZINE MALEATE 10 MG PO TABS
ORAL_TABLET | ORAL | Status: AC
  Filled 2017-07-13: qty 1

## 2017-07-13 NOTE — Progress Notes (Signed)
OK to treat with SCr 1.7 per Dr. Lebron Conners

## 2017-07-13 NOTE — Patient Instructions (Signed)
[facility] Discharge Instructions for Patients Receiving Chemotherapy  Today you received the following chemotherapy agent:  Velcade.  To help prevent nausea and vomiting after your treatment, we encourage you to take your nausea medication as directed.   If you develop nausea and vomiting that is not controlled by your nausea medication, call the clinic.   BELOW ARE SYMPTOMS THAT SHOULD BE REPORTED IMMEDIATELY:  *FEVER GREATER THAN 100.5 F  *CHILLS WITH OR WITHOUT FEVER  NAUSEA AND VOMITING THAT IS NOT CONTROLLED WITH YOUR NAUSEA MEDICATION  *UNUSUAL SHORTNESS OF BREATH  *UNUSUAL BRUISING OR BLEEDING  TENDERNESS IN MOUTH AND THROAT WITH OR WITHOUT PRESENCE OF ULCERS  *URINARY PROBLEMS  *BOWEL PROBLEMS  UNUSUAL RASH Items with * indicate a potential emergency and should be followed up as soon as possible.  Feel free to call the clinic should you have any questions or concerns. The clinic phone number is [phone].  Please show the CHEMO ALERT CARD at check-in to the Emergency Department and triage nurse.   

## 2017-07-13 NOTE — Telephone Encounter (Signed)
Scheduled appt per 1/4 los - Gave patient AVS and calender per los.  

## 2017-07-14 LAB — PHOSPHORUS: PHOSPHORUS: 3.6 mg/dL (ref 2.5–4.5)

## 2017-07-15 NOTE — Assessment & Plan Note (Signed)
72 y.o. male with diagnosis of IgA lambda active multiple myeloma, ISS Stage I. Active disease diagnosed based on presence of anemia, kidney insufficiency, and paraproteinemia with significant predominance of lambda light chains as well as significant elevation of IgA. Bone marrow biopsy confirms presence of atypical monoclonal plasma cell process in the bone marrow comprising at least 7% of the cellularity.  Based on the findings, patient was started on treatment with lenalidomide, bortezomib, and low-dose dexamethasone based on the anticipated tolerance by the patient. Lenalidomide was started at 10 mg daily based on creatinine clearance of 42, dexamethasone dose was reduced to 20 mg weekly based on patient's age. Treatment was complicated by rapid development of cutaneous rash attributed to lenalidomide. Rash responded to topical steroids. Renal function also has decreased and now patient has persistent severe anemia also attributable to lenalidomide. Due to the severity of complications, lenalidomide was removed.  Patient is doing remarkably well with the current regimen.  Disease assessment after 3 cycles of therapy demonstrates complete response based on biochemical protein profile as well as bone marrow assessment showing less than 1% of plasma cells with polyclonal characteristics and no residual monoclonal plasma cell process.  Cytogenetics are also negative.  Plan: --Continue prophylactic acyclovir and low-dose Bactrim to reduce the risk of VZV reactivation and PCP infection respectively --He is receiving aspirin and that should service efficient thromboprophylaxis in the context of lower dose of thalidomide --Proceed with systemic therapy as previously scheduled with weekly treatments as previously planned and change cycles to 4-week intervals --As previously planned, we will extend induction component of the treatment out to 6 cycles, I will plan on repeating a bone marrow biopsy including  cytogenetics and molecular profile after 6 cycles of treatment prior to considering possible maintenance therapy with bortezomib. --Return to clinic in 4 weeks with labs and possible initiation of the 5th cycle of systemic therapy

## 2017-07-15 NOTE — Progress Notes (Signed)
[facility] Follow-up Visit:  Assessment: Multiple myeloma not having achieved remission ([place]) 72 y.o. male with diagnosis of IgA lambda active multiple myeloma, ISS Stage I. Active disease diagnosed based on presence of anemia, kidney insufficiency, and paraproteinemia with significant predominance of lambda light chains as well as significant elevation of IgA. Bone marrow biopsy confirms presence of atypical monoclonal plasma cell process in the bone marrow comprising at least 7% of the cellularity.  Based on the findings, patient was started on treatment with lenalidomide, bortezomib, and low-dose dexamethasone based on the anticipated tolerance by the patient. Lenalidomide was started at 10 mg daily based on creatinine clearance of 42, dexamethasone dose was reduced to 20 mg weekly based on patient's age. Treatment was complicated by rapid development of cutaneous rash attributed to lenalidomide. Rash responded to topical steroids. Renal function also has decreased and now patient has persistent severe anemia also attributable to lenalidomide. Due to the severity of complications, lenalidomide was removed.  Patient is doing remarkably well with the current regimen.  Disease assessment after 3 cycles of therapy demonstrates complete response based on biochemical protein profile as well as bone marrow assessment showing less than 1% of plasma cells with polyclonal characteristics and no residual monoclonal plasma cell process.  Cytogenetics are also negative.  Plan: --Continue prophylactic acyclovir and low-dose Bactrim to reduce the risk of VZV reactivation and PCP infection respectively --He is receiving aspirin and that should service efficient thromboprophylaxis in the context of lower dose of thalidomide --Proceed with systemic therapy as previously scheduled with weekly treatments as previously planned and change cycles to 4-week intervals --As previously planned, we will  extend induction component of the treatment out to 6 cycles, I will plan on repeating a bone marrow biopsy including cytogenetics and molecular profile after 6 cycles of treatment prior to considering possible maintenance therapy with bortezomib. --Return to clinic in 4 weeks with labs and possible initiation of the 5th cycle of systemic therapy  Voice recognition software was used and creation of this note. Despite my best effort at editing the text, some misspelling/errors may have occurred.  Orders Placed This Encounter  Procedures  . CBC with Differential    Standing Status:   Future    Number of Occurrences:   1    Standing Expiration Date:   06/15/2018  . Comprehensive metabolic panel    Standing Status:   Future    Number of Occurrences:   1    Standing Expiration Date:   06/15/2018  . Magnesium    Standing Status:   Future    Number of Occurrences:   1    Standing Expiration Date:   06/15/2018  . Phosphorus    Standing Status:   Future    Number of Occurrences:   1    Standing Expiration Date:   06/15/2018    Cancer Staging Multiple myeloma not having achieved remission ([place]) Staging form: Plasma Cell Myeloma and Plasma Cell Disorders, AJCC 8th Edition - Clinical stage from 03/21/2017: RISS Stage I (Beta-2-microglobulin (mg/L): 3.2, Albumin (g/dL): 3.6, ISS: Stage I, High-risk cytogenetics: Absent, LDH: Normal) - Signed by Robert Sax, MD on 04/04/2017 - Clinical: No stage assigned - Unsigned   All questions were answered.  . The patient knows to call the clinic with any problems, questions or concerns.  This note was electronically signed.    History of Presenting Illness Robert Sparks is a 72 y.o. male followed in the [facility]  for new diagnosis of IgA lambda active multiple myeloma. At presentation, Robert Sparks reported having some night sweats, but no weight loss. He indicated weakness in bilateral thighs associated with the feet and legs. Complained of  constipation, but denies any numbness or tingling in hands and feet. Denied any new musculoskeletal pain. No chest pain, shortness of breath or cough. No nausea, vomiting, abdominal pain, diarrhea, or early satiety. Additional evaluation was conducted resulting in negative imaging including negative skeletal survey on PET/CT. Bone marrow biopsy returned positive with 7% involvement with atypical monoclonal plasma cells. In conjunction with kidney injury, anemia, dysproteinemia with significant elevation of lambda light chains and IgA, this confirmed a diagnosis of active multiple myeloma warranting intervention.   Patient presents to the clinic while receiving systemic therapy with combinational bortezomib and dexamethasone.  Patient reports feeling reasonably well and denies any new symptoms at this time.  Continues to complain of intermittent fatigue and back pain.  Occasional knee pain, but no new or progressive skeletal pain.  Oncological/hematological History: --Labs, 10/28/15: WBC 6.0, Hgb 10.3, Plt 245;   --Labs, 10/02/16: WBC 5.1, Hgb 10.0, Plt 227; ANA -- negative --Labs, 11/10/16: tProt 6.3, Alb 3.4, Ca   9.1, Cr 2.56, AP 82; WBC 4.7, Hgb 8.1, Plt 201; Fe 43, FeSat 16%, TIBC 272, Ferritin 476, Vit B12 199, MMA 283; TSH 1.57 --Labs, 12/25/16: tProt 7.0, Alb 4.1, Ca   9.9, Cr 1.47, AP ...; SPEP -- M-spike 0.8g/dL; kappa 40.7, lambda 305.1, KLR 0.13; WBC 3.4, Hgb 13.2, Plt 154; --Labs, 02/09/17: tProt 8.0, Alb 3.9, Ca 10.1, Cr 1.80, AP 97; SPEP -- M-spike 0.7g.dL, IgA lambda; IgA 1220, IgG 666, IgM 52; kappa 33.5, lambda 433.5, KLR 0.08; WBC 6.3, Hgb 10.6, Plt 194 --Labs, 04/05/17: tProt 7.0, Alb 3.5, Ca   8.9, Cr 2.20, AP 75; SPEP -- M-spike 0.4g/dL, IgA lambda; IgA   433, IgG 683, IgM 52; kappa 59.7, lambda   42.7, KLR 1.40; WBC 5.0, Hgb   7.4, Plt 280;    Multiple myeloma not having achieved remission ([place])   02/16/2017 Imaging    Skeletal Survey: No definite lytic myelomatous lesions in the  axial or appendicular skeleton      02/23/2017 Initial Diagnosis    Multiple myeloma not having achieved remission ([place])      03/09/2017 Imaging    PET-CT: No abnormal hypermetabolic activity or other specific findings of active myeloma. Several adjacent healing left lateral rib fractures.      03/13/2017 Pathology Results    Bone Marrow Bx: HYPERCELLULAR BONE MARROW FOR AGE WITH PLASMA CELL NEOPLASM. TRILINEAGE HEMATOPOIESIS. NORMOCYTIC-NORMOCHROMIA ANEMIA. EOSINOPHILIA. The bone marrow shows slight increase in atypical plasma cells representing 7% of all cells associated with small clusters in the clot and biopsy sections. Immunohistochemical stains highlight the plasma cell component in the bone marrow which shows lambda light chain restriction consistent with plasma cell neoplasm. The background shows trilineage hematopoiesis with non-specific myeloid changes. The peripheral blood shows eosinophilia without an associated increase in eosinophils in the bone marrow. The changes may represent a hypersensitivity reaction. Cytogenetics/FISH: 46,XY -- Positive for +11, +17/17p+ -- extra CCND1 copy in 9% cells, gain of ATM copy in 9% & 11% cells with p53 gain.        03/16/2017 -  Chemotherapy    Induction chemotherapy: Lenalidomide 5m PO Qday, d1-14 + Bortezomib 1.326mm2 IV d1,4,8,11 + dexamethasone 2032mO QWk Q21d --Cycle #1, 09/48/25/00omplicated by grade 2 rash within 1 week of initiation, as well  as a aggressive rising creatinine; d11 held due to worsening rash --Cycle #2, 04/13/17: Initiated without lenalidomide due to persistent Grade 3 anemia --Cycle #3, 05/18/17: dosing changed to weekly bortezomib, cycle duration extended to 4 weeks --Cycle #4, 06/15/17:       06/08/2017 Pathology Results    BM Bx:  --Pathology: Plasma cells are down to 1% with polyclonal appearance. --CytoGen: Normal, 46,XY      06/15/2017 Tumor Marker    tProt 7.1, Alb 3.8, Ca 9.7, Cr 1.6, AP ... LDH 153,  beta-2 microglobulin 3.3 SPEP -- No M-spike; SIFE -- normal IgG 688, IgA 105, IgM 47; kappa 50.7, lambda 19.3, KLR 2.63 WBC 3.6, ANC 2.1, Hgb 9.4, Plt 204       Medical History: Past Medical History:  Diagnosis Date  . Anemia   . Arthritis    shoulders,feet   . Cataract    per eye dr -has appt 5-11  . CKD (chronic kidney disease), stage III ([place])   . Elevated PSA   . History of ketoacidosis    03-29-2015   . History of sepsis    07-25-2013  non-compliant w/ medication  . Hyperlipidemia   . Hypertension   . Nocturia   . Peripheral neuropathy   . Prostate cancer ([place])   . Type 2 diabetes mellitus with insulin therapy [facility])     Surgical History: Past Surgical History:  Procedure Laterality Date  . CIRCUMCISION  1990's  . PROSTATE BIOPSY N/A 12/21/2015   Procedure: BIOPSY TRANSRECTAL ULTRASONIC PROSTATE (TUBP);  Surgeon: Festus Aloe, MD;  Location: [facility];  Service: Urology;  Laterality: N/A;    Family History: Family History  Problem Relation Age of Onset  . Cancer Neg Hx   . Colon cancer Neg Hx   . Colon polyps Neg Hx   . Esophageal cancer Neg Hx   . Rectal cancer Neg Hx   . Stomach cancer Neg Hx     Social History: Social History   Socioeconomic History  . Marital status: Single    Spouse name: Not on file  . Number of children: Not on file  . Years of education: Not on file  . Highest education level: Not on file  Social Needs  . Financial resource strain: Not on file  . Food insecurity - worry: Not on file  . Food insecurity - inability: Not on file  . Transportation needs - medical: Not on file  . Transportation needs - non-medical: Not on file  Occupational History  . Not on file  Tobacco Use  . Smoking status: Current Every Day Smoker    Packs/day: 0.25    Years: 50.00    Pack years: 12.50    Types: Cigarettes  . Smokeless tobacco: Never Used  . Tobacco comment: 1 ppwk-4-5 a day   Substance and Sexual Activity  .  Alcohol use: Yes    Alcohol/week: 0.6 oz    Types: 1 Cans of beer per week    Comment: 1 every day-quit couple weeks ago  . Drug use: No  . Sexual activity: Not Currently  Other Topics Concern  . Not on file  Social History Narrative  . Not on file    Allergies: No Known Allergies  Medications:  Current Outpatient Medications  Medication Sig Dispense Refill  . ACCU-CHEK AVIVA PLUS test strip See admin instructions.  12  . acyclovir (ZOVIRAX) 400 MG tablet Take 1 tablet (400 mg total) by mouth 2 (two) times daily. [place]  tablet 3  . amLODipine (NORVASC) 5 MG tablet Take 1 tablet (5 mg total) by mouth daily. 90 tablet 3  . Ascorbic Acid (VITAMIN C) 1000 MG tablet Take 1,000 mg by mouth daily.    [name] aspirin EC 81 MG tablet Take 1 tablet (81 mg total) by mouth daily. 90 tablet 3  . Cholecalciferol (VITAMIN D) 2000 units CAPS Take 2,000 Units by mouth daily.    . cyanocobalamin (V-R VITAMIN B-12) 500 MCG tablet Take 1 tablet (500 mcg total) by mouth 2 (two) times daily. 120 tablet 6  . ferrous sulfate 325 (65 FE) MG tablet Take 325 mg by mouth daily with breakfast.    . folic acid (FOLVITE) 1 MG tablet Take 1 tablet (1 mg total) by mouth daily. 60 tablet 0  . insulin aspart protamine - aspart (NOVOLOG 70/30 MIX) (70-30) 100 UNIT/ML FlexPen Inject 0.05 mLs (5 Units total) into the skin 2 (two) times daily with a meal. (Patient taking differently: Inject 5 Units into the skin 2 (two) times daily with a meal. ) 6 mL 2  . lidocaine-prilocaine (EMLA) cream Apply to affected area once 30 g 3  . LORazepam (ATIVAN) 0.5 MG tablet Take 1 tablet (0.5 mg total) by mouth every 6 (six) hours as needed (Nausea or vomiting). 30 tablet 0  . mometasone (ELOCON) 0.1 % cream Apply 1 application topically daily. 45 g 3  . Multiple Vitamin (MULTIVITAMIN WITH MINERALS) TABS tablet Take 1 tablet by mouth daily. 60 tablet 2  . nicotine (NICODERM CQ - DOSED IN MG/24 HOURS) 14 mg/24hr patch Place 1 patch (14 mg total)  onto the skin daily. 30 patch 0  . nicotine (NICODERM CQ - DOSED IN MG/24 HR) 7 mg/24hr patch Place 1 patch (7 mg total) onto the skin daily. 28 patch 0  . ondansetron (ZOFRAN) 8 MG tablet Take 1 tablet (8 mg total) by mouth 2 (two) times daily as needed (Nausea or vomiting). 30 tablet 1  . pravastatin (PRAVACHOL) 20 MG tablet Take 1 tablet (20 mg total) by mouth daily. 90 tablet 3  . prochlorperazine (COMPAZINE) 10 MG tablet Take 1 tablet (10 mg total) by mouth every 6 (six) hours as needed (Nausea or vomiting). 30 tablet 1  . senna-docusate (SENOKOT-S) 8.6-50 MG tablet Take 2 tablets by mouth 2 (two) times daily. 60 tablet 2  . sulfamethoxazole-trimethoprim (BACTRIM) 400-80 MG tablet Take 1 tablet by mouth daily. 21 tablet 1  . thiamine 100 MG tablet Take 1 tablet (100 mg total) by mouth daily. 60 tablet 0  . [START ON 07/16/2017] dexamethasone (DECADRON) 4 MG tablet Take 2.5 tablets (10 mg total) by mouth 2 (two) times a week. 40 tablet 0  . gabapentin (NEURONTIN) 300 MG capsule Take 1 capsule in the morning and at lunch. Take 2 capsules at night. 120 capsule 0  . potassium chloride (K-DUR) 10 MEQ tablet Take 2 tablets (20 mEq total) by mouth daily. 60 tablet 0   Current Facility-Administered Medications  Medication Dose Route Frequency Provider Last Rate Last Dose  . 0.9 %  sodium chloride infusion  500 mL Intravenous Continuous Pyrtle, Lajuan Lines, MD        Review of Systems: Review of Systems  Constitutional: Positive for fatigue. Negative for diaphoresis and unexpected weight change.  HENT:   Positive for sore throat. Negative for trouble swallowing and voice change.   Eyes: Negative.   Respiratory: Positive for cough.   Cardiovascular: Negative.   Gastrointestinal: Positive for constipation.  Endocrine: Negative.   Genitourinary: Negative.    Skin: Negative for itching and rash.  Neurological: Positive for extremity weakness.  Hematological: Negative.   Psychiatric/Behavioral:  Negative.      PHYSICAL EXAMINATION Blood pressure (!) 146/85, pulse 93, temperature 98.5 F (36.9 C), temperature source Oral, resp. rate 19, height _0  (1.626 m), weight 157 lb 3.2 oz (71.3 kg), SpO2 100 %.  ECOG PERFORMANCE STATUS: 2 - Symptomatic, <50% confined to bed  Physical Exam  Constitutional: He is oriented to person, place, and time and well-developed, well-nourished, and in no distress. No distress.  HENT:  Head: Normocephalic and atraumatic.  Patient still has some of his lower front teeth left. These appear to be in good health without evidence of infection. Dissemination of the oral cavity also reveals presence of a white patch over the posterior left lower gum without ulceration or bleeding. No other areas of abnormalities noted.  Eyes: Conjunctivae and EOM are normal. Pupils are equal, round, and reactive to light. No scleral icterus.  Neck: No thyromegaly present.  Cardiovascular: Normal rate and regular rhythm.  No murmur heard. Pulmonary/Chest: Effort normal and breath sounds normal. No respiratory distress. He has no wheezes.  Abdominal: Soft. He exhibits no distension and no mass. There is no tenderness.  Musculoskeletal: He exhibits no edema or tenderness.  Lymphadenopathy:    He has no cervical adenopathy.  Neurological: He is alert and oriented to person, place, and time. He has normal reflexes. No cranial nerve deficit.  Skin: Skin is warm and dry. No rash noted. He is not diaphoretic. No erythema.  Interval near complete resolution of the cutaneous rash.     LABORATORY DATA: I have personally reviewed the data as listed: Appointment on 06/13/2017  Component Date Value Ref Range Status  . Ig Kappa Free Light Chain 06/13/2017 50.7* 3.3 - 19.4 mg/L Final  . Ig Lambda Free Light Chain 06/13/2017 19.3  5.7 - 26.3 mg/L Final  . Kappa/Lambda FluidC Ratio 06/13/2017 2.63* 0.26 - 1.65 Final  . IgG, Qn, Serum 06/13/2017 688* 700 - 1,600 mg/dL Final  . IgA, Qn,  Serum 06/13/2017 105  61 - 437 mg/dL Final  . IgM, Qn, Serum 06/13/2017 47  20 - 172 mg/dL Final  . Total Protein 06/13/2017 6.0  6.0 - 8.5 g/dL Final  . Albumin SerPl Elph-Mcnc 06/13/2017 3.4  2.9 - 4.4 g/dL Final  . Alpha 1 06/13/2017 0.2  0.0 - 0.4 g/dL Final  . Alpha2 Glob SerPl Elph-Mcnc 06/13/2017 0.8  0.4 - 1.0 g/dL Final  . B-Globulin SerPl Elph-Mcnc 06/13/2017 0.9  0.7 - 1.3 g/dL Final  . Gamma Glob SerPl Elph-Mcnc 06/13/2017 0.7  0.4 - 1.8 g/dL Final  . M Protein SerPl Elph-Mcnc 06/13/2017 Not Observed  Not Observed g/dL Final  . Globulin, Total 06/13/2017 2.6  2.2 - 3.9 g/dL Final  . Albumin/Glob SerPl 06/13/2017 1.4  0.7 - 1.7 Final  . IFE 1 06/13/2017 Comment   Final   An apparent normal immunofixation pattern.  . Please Note 06/13/2017 Comment   Final   Comment: Protein electrophoresis scan will follow via computer, mail, or courier delivery.   . Beta-2 06/13/2017 3.3* 0.6 - 2.4 mg/L Final   Siemens Immulite 2000 Immunochemiluminometric assay (ICMA)  . Phosphorus, Ser 06/13/2017 4.1  2.5 - 4.5 mg/dL Final  . Magnesium 06/13/2017 2.4  1.5 - 2.5 mg/dl Final  . LDH 06/13/2017 153  125 - 245 U/L Final  . Sodium 06/13/2017 141  136 -  145 mEq/L Final  . Potassium 06/13/2017 3.2* 3.5 - 5.1 mEq/L Final  . Chloride 06/13/2017 104  98 - 109 mEq/L Final  . CO2 06/13/2017 24  22 - 29 mEq/L Final  . Glucose 06/13/2017 84  70 - 140 mg/dl Final   Glucose reference range is for nonfasting patients. Fasting glucose reference range is 70- 100.  [name] BUN 06/13/2017 23.2  7.0 - 26.0 mg/dL Final  . Creatinine 06/13/2017 1.6* 0.7 - 1.3 mg/dL Final  . Total Bilirubin 06/13/2017 0.42  0.20 - 1.20 mg/dL Final  . Alkaline Phosphatase 06/13/2017 111  40 - 150 U/L Final  . AST 06/13/2017 13  5 - 34 U/L Final  . ALT 06/13/2017 11  0 - 55 U/L Final  . Total Protein 06/13/2017 7.1  6.4 - 8.3 g/dL Final  . Albumin 06/13/2017 3.8  3.5 - 5.0 g/dL Final  . Calcium 06/13/2017 9.7  8.4 - 10.4 mg/dL Final   . Anion Gap 06/13/2017 12* 3 - 11 mEq/L Final  . EGFR 06/13/2017 49* >60 ml/min/1.73 m2 Final   eGFR is calculated using the CKD-EPI Creatinine Equation (2009)  . WBC 06/13/2017 3.6* 4.0 - 10.3 10e3/uL Final  . NEUT# 06/13/2017 2.1  1.5 - 6.5 10e3/uL Final  . HGB 06/13/2017 9.4* 13.0 - 17.1 g/dL Final  . HCT 06/13/2017 28.5* 38.4 - 49.9 % Final  . Platelets 06/13/2017 204  140 - 400 10e3/uL Final  . MCV 06/13/2017 96.3  79.3 - 98.0 fL Final  . MCH 06/13/2017 31.8  27.2 - 33.4 pg Final  . MCHC 06/13/2017 33.0  32.0 - 36.0 g/dL Final  . RBC 06/13/2017 2.96* 4.20 - 5.82 10e6/uL Final  . RDW 06/13/2017 14.1  11.0 - 14.6 % Final  . lymph# 06/13/2017 0.8* 0.9 - 3.3 10e3/uL Final  . MONO# 06/13/2017 0.3  0.1 - 0.9 10e3/uL Final  . Eosinophils Absolute 06/13/2017 0.5  0.0 - 0.5 10e3/uL Final  . Basophils Absolute 06/13/2017 0.0  0.0 - 0.1 10e3/uL Final  . NEUT% 06/13/2017 56.4  39.0 - 75.0 % Final  . LYMPH% 06/13/2017 21.5  14.0 - 49.0 % Final  . MONO% 06/13/2017 8.8  0.0 - 14.0 % Final  . EOS% 06/13/2017 12.7* 0.0 - 7.0 % Final  . BASO% 06/13/2017 0.6  0.0 - 2.0 % Final       Robert Sax, MD

## 2017-07-16 MED FILL — PRAVASTATIN NA 20 MG TAB: 20 | 30 days supply | Qty: 30 | Fill #6

## 2017-07-16 MED FILL — AMLODIPINE BESYLATE 5 MG TA: 5 | 30 days supply | Qty: 30 | Fill #6

## 2017-07-16 MED FILL — FERROUS SULFATE 325 MG TAB: 325 (65 FE) | 30 days supply | Qty: 30 | Fill #6

## 2017-07-20 ENCOUNTER — Other Ambulatory Visit: Payer: Self-pay

## 2017-07-20 ENCOUNTER — Inpatient Hospital Stay: Payer: Medicare HMO | Attending: Hematology and Oncology

## 2017-07-20 ENCOUNTER — Inpatient Hospital Stay: Payer: Medicare HMO

## 2017-07-20 VITALS — BP 105/66 | HR 79 | Temp 98.7°F | Resp 18

## 2017-07-20 DIAGNOSIS — C9000 Multiple myeloma not having achieved remission: Secondary | ICD-10-CM | POA: Diagnosis not present

## 2017-07-20 DIAGNOSIS — Z5112 Encounter for antineoplastic immunotherapy: Secondary | ICD-10-CM | POA: Insufficient documentation

## 2017-07-20 LAB — BASIC METABOLIC PANEL - CANCER CENTER ONLY
ANION GAP: 10 (ref 3–11)
BUN: 39 mg/dL — ABNORMAL HIGH (ref 7–26)
CO2: 23 mmol/L (ref 22–29)
Calcium: 9.3 mg/dL (ref 8.4–10.4)
Chloride: 102 mmol/L (ref 98–109)
Creatinine: 2.16 mg/dL — ABNORMAL HIGH (ref 0.70–1.30)
GFR, EST NON AFRICAN AMERICAN: 29 mL/min — AB (ref >60)
GFR, Est AFR Am: 34 mL/min — ABNORMAL LOW (ref >60)
GLUCOSE: 261 mg/dL — AB (ref 70–140)
POTASSIUM: 4.1 mmol/L (ref 3.5–5.1)
Sodium: 135 mmol/L — ABNORMAL LOW (ref 136–145)

## 2017-07-20 LAB — CBC WITH DIFFERENTIAL (CANCER CENTER ONLY)
Basophils Absolute: 0 10*3/uL (ref 0.0–0.1)
Basophils Relative: 1 %
Eosinophils Absolute: 0.7 10*3/uL — ABNORMAL HIGH (ref 0.0–0.5)
Eosinophils Relative: 15 %
HEMATOCRIT: 31.8 % — AB (ref 38.4–49.9)
Hemoglobin: 10.4 g/dL — ABNORMAL LOW (ref 13.0–17.1)
LYMPHS PCT: 16 %
Lymphs Abs: 0.8 10*3/uL — ABNORMAL LOW (ref 0.9–3.3)
MCH: 31.4 pg (ref 27.2–33.4)
MCHC: 32.6 g/dL (ref 32.0–36.0)
MCV: 96.4 fL (ref 79.3–98.0)
Monocytes Absolute: 0.3 10*3/uL (ref 0.1–0.9)
Monocytes Relative: 7 %
NEUTROS ABS: 2.8 10*3/uL (ref 1.5–6.5)
NEUTROS PCT: 61 %
Platelet Count: 212 10*3/uL (ref 140–400)
RBC: 3.3 MIL/uL — AB (ref 4.20–5.82)
RDW: 15.5 % (ref 11.0–15.6)
WBC: 4.6 10*3/uL (ref 4.0–10.3)

## 2017-07-20 LAB — PHOSPHORUS: Phosphorus: 4 mg/dL (ref 2.5–4.6)

## 2017-07-20 LAB — MAGNESIUM: MAGNESIUM: 2.6 mg/dL — AB (ref 1.5–2.5)

## 2017-07-20 MED ORDER — BORTEZOMIB CHEMO SQ INJECTION 3.5 MG (2.5MG/ML)
1.3000 mg/m2 | Freq: Once | INTRAMUSCULAR | Status: AC
  Administered 2017-07-20: 2.25 mg via SUBCUTANEOUS
  Filled 2017-07-20: qty 2.25

## 2017-07-20 MED ORDER — PROCHLORPERAZINE MALEATE 10 MG PO TABS
10.0000 mg | ORAL_TABLET | Freq: Once | ORAL | Status: AC
  Administered 2017-07-20: 10 mg via ORAL

## 2017-07-20 MED ORDER — PROCHLORPERAZINE MALEATE 10 MG PO TABS
ORAL_TABLET | ORAL | Status: AC
  Filled 2017-07-20: qty 1

## 2017-07-20 NOTE — Patient Instructions (Signed)
[facility] Discharge Instructions for Patients Receiving Chemotherapy  Today you received the following chemotherapy agents Velcade.  To help prevent nausea and vomiting after your treatment, we encourage you to take your nausea medication as directed.  If you develop nausea and vomiting that is not controlled by your nausea medication, call the clinic.   BELOW ARE SYMPTOMS THAT SHOULD BE REPORTED IMMEDIATELY:  *FEVER GREATER THAN 100.5 F  *CHILLS WITH OR WITHOUT FEVER  NAUSEA AND VOMITING THAT IS NOT CONTROLLED WITH YOUR NAUSEA MEDICATION  *UNUSUAL SHORTNESS OF BREATH  *UNUSUAL BRUISING OR BLEEDING  TENDERNESS IN MOUTH AND THROAT WITH OR WITHOUT PRESENCE OF ULCERS  *URINARY PROBLEMS  *BOWEL PROBLEMS  UNUSUAL RASH Items with * indicate a potential emergency and should be followed up as soon as possible.  Feel free to call the clinic should you have any questions or concerns. The clinic phone number is [phone].  Please show the CHEMO ALERT CARD at check-in to the Emergency Department and triage nurse.   

## 2017-07-20 NOTE — Progress Notes (Signed)
Per Dr. Lebron Conners, okay to treat pt with Crt of 2.16  Per Dr. Lebron Conners, hold Zometa today for pt Crt of 2.16

## 2017-07-23 NOTE — Assessment & Plan Note (Signed)
72 y.o. male with diagnosis of IgA lambda active multiple myeloma, ISS Stage I. Active disease diagnosed based on presence of anemia, kidney insufficiency, and paraproteinemia with significant predominance of lambda light chains as well as significant elevation of IgA. Bone marrow biopsy confirms presence of atypical monoclonal plasma cell process in the bone marrow comprising at least 7% of the cellularity.  Based on the findings, patient was started on treatment with lenalidomide, bortezomib, and low-dose dexamethasone based on the anticipated tolerance by the patient. Lenalidomide was started at 10 mg daily based on creatinine clearance of 42, dexamethasone dose was reduced to 20 mg weekly based on patient's age. Treatment was complicated by rapid development of cutaneous rash attributed to lenalidomide. Rash responded to topical steroids. Renal function also has decreased and now patient has persistent severe anemia also attributable to lenalidomide. Due to the severity of complications, lenalidomide was removed.  Patient is doing remarkably well with the current regimen.  Disease assessment after 3 cycles of therapy demonstrates complete response based on biochemical protein profile as well as bone marrow assessment showing less than 1% of plasma cells with polyclonal characteristics and no residual monoclonal plasma cell process.  Cytogenetics are also negative.  Plan: --Continue prophylactic acyclovir and low-dose Bactrim to reduce the risk of VZV reactivation and PCP infection respectively --Proceed with cycle #5 of bortezomib unchanged dosing. --As previously planned, we will extend induction component of the treatment out to 6 cycles, I will plan on repeating a bone marrow biopsy including cytogenetics and molecular profile after 6 cycles of treatment prior to considering possible maintenance therapy with bortezomib. --Return to clinic in 4 weeks with labs and possible initiation of the 6th cycle  of systemic therapy

## 2017-07-23 NOTE — Progress Notes (Signed)
[facility] Follow-up Visit:  Assessment: Multiple myeloma in remission [facility]) 72 y.o. male with diagnosis of IgA lambda active multiple myeloma, ISS Stage I. Active disease diagnosed based on presence of anemia, kidney insufficiency, and paraproteinemia with significant predominance of lambda light chains as well as significant elevation of IgA. Bone marrow biopsy confirms presence of atypical monoclonal plasma cell process in the bone marrow comprising at least 7% of the cellularity.  Based on the findings, patient was started on treatment with lenalidomide, bortezomib, and low-dose dexamethasone based on the anticipated tolerance by the patient. Lenalidomide was started at 10 mg daily based on creatinine clearance of 42, dexamethasone dose was reduced to 20 mg weekly based on patient's age. Treatment was complicated by rapid development of cutaneous rash attributed to lenalidomide. Rash responded to topical steroids. Renal function also has decreased and now patient has persistent severe anemia also attributable to lenalidomide. Due to the severity of complications, lenalidomide was removed.  Patient is doing remarkably well with the current regimen.  Disease assessment after 3 cycles of therapy demonstrates complete response based on biochemical protein profile as well as bone marrow assessment showing less than 1% of plasma cells with polyclonal characteristics and no residual monoclonal plasma cell process.  Cytogenetics are also negative.  Plan: --Continue prophylactic acyclovir and low-dose Bactrim to reduce the risk of VZV reactivation and PCP infection respectively --Proceed with cycle #5 of bortezomib unchanged dosing. --As previously planned, we will extend induction component of the treatment out to 6 cycles, I will plan on repeating a bone marrow biopsy including cytogenetics and molecular profile after 6 cycles of treatment prior to considering possible maintenance  therapy with bortezomib. --Return to clinic in 4 weeks with labs and possible initiation of the 6th cycle of systemic therapy  Voice recognition software was used and creation of this note. Despite my best effort at editing the text, some misspelling/errors may have occurred.  Orders Placed This Encounter  Procedures  . CBC with Differential    Standing Status:   Future    Standing Expiration Date:   07/13/2018  . Comprehensive metabolic panel    Standing Status:   Future    Standing Expiration Date:   07/13/2018  . Lactate dehydrogenase (LDH)    Standing Status:   Future    Standing Expiration Date:   07/13/2018  . Magnesium    Standing Status:   Future    Standing Expiration Date:   07/13/2018  . Phosphorus    Standing Status:   Future    Standing Expiration Date:   07/13/2018  . Beta 2 microglobulin    Standing Status:   Future    Standing Expiration Date:   07/13/2018  . QIG  (Quant. immunoglobulins  - IgG, IgA, IgM)    Standing Status:   Future    Standing Expiration Date:   07/13/2018  . SPEP with reflex to IFE    Standing Status:   Future    Standing Expiration Date:   07/13/2018  . Kappa/lambda light chains    Standing Status:   Future    Standing Expiration Date:   07/13/2018    Cancer Staging Multiple myeloma not having achieved remission ([place]) Staging form: Plasma Cell Myeloma and Plasma Cell Disorders, AJCC 8th Edition - Clinical stage from 03/21/2017: RISS Stage I (Beta-2-microglobulin (mg/L): 3.2, Albumin (g/dL): 3.6, ISS: Stage I, High-risk cytogenetics: Absent, LDH: Normal) - Signed by [name], MD on 04/04/2017 - Clinical: No  stage assigned - Unsigned   All questions were answered.  . The patient knows to call the clinic with any problems, questions or concerns.  This note was electronically signed.    History of Presenting Illness Robert Sparks is a 72 y.o. male followed in the [place] for new diagnosis of IgA lambda active multiple myeloma. At  presentation, Robert Sparks reported having some night sweats, but no weight loss. He indicated weakness in bilateral thighs associated with the feet and legs. Complained of constipation, but denies any numbness or tingling in hands and feet. Denied any new musculoskeletal pain. No chest pain, shortness of breath or cough. No nausea, vomiting, abdominal pain, diarrhea, or early satiety. Additional evaluation was conducted resulting in negative imaging including negative skeletal survey on PET/CT. Bone marrow biopsy returned positive with 7% involvement with atypical monoclonal plasma cells. In conjunction with kidney injury, anemia, dysproteinemia with significant elevation of lambda light chains and IgA, this confirmed a diagnosis of active multiple myeloma warranting intervention.   Patient presents to the clinic while receiving systemic therapy with combinational bortezomib and dexamethasone.  Patient reports feeling reasonably well and denies any new symptoms at this time.  Continues to complain of intermittent fatigue and back pain.  Occasional knee pain, but no new or progressive skeletal pain.  Oncological/hematological History: --Labs, 10/28/15: WBC 6.0, Hgb 10.3, Plt 245;   --Labs, 10/02/16: WBC 5.1, Hgb 10.0, Plt 227; ANA -- negative --Labs, 11/10/16: tProt 6.3, Alb 3.4, Ca   9.1, Cr 2.56, AP 82; WBC 4.7, Hgb 8.1, Plt 201; Fe 43, FeSat 16%, TIBC 272, Ferritin 476, Vit B12 199, MMA 283; TSH 1.57 --Labs, 12/25/16: tProt 7.0, Alb 4.1, Ca   9.9, Cr 1.47, AP ...; SPEP -- M-spike 0.8g/dL; kappa 40.7, lambda 305.1, KLR 0.13; WBC 3.4, Hgb 13.2, Plt 154; --Labs, 02/09/17: tProt 8.0, Alb 3.9, Ca 10.1, Cr 1.80, AP 97; SPEP -- M-spike 0.7g.dL, IgA lambda; IgA 1220, IgG 666, IgM 52; kappa 33.5, lambda 433.5, KLR 0.08; WBC 6.3, Hgb 10.6, Plt 194 --Labs, 04/05/17: tProt 7.0, Alb 3.5, Ca   8.9, Cr 2.20, AP 75; SPEP -- M-spike 0.4g/dL, IgA lambda; IgA   433, IgG 683, IgM 52; kappa 59.7, lambda   42.7, KLR 1.40; WBC  5.0, Hgb   7.4, Plt 280;    Multiple myeloma in remission ([place])   02/16/2017 Imaging    Skeletal Survey: No definite lytic myelomatous lesions in the axial or appendicular skeleton      02/23/2017 Initial Diagnosis    Multiple myeloma not having achieved remission ([place])      03/09/2017 Imaging    PET-CT: No abnormal hypermetabolic activity or other specific findings of active myeloma. Several adjacent healing left lateral rib fractures.      03/13/2017 Pathology Results    Bone Marrow Bx: HYPERCELLULAR BONE MARROW FOR AGE WITH PLASMA CELL NEOPLASM. TRILINEAGE HEMATOPOIESIS. NORMOCYTIC-NORMOCHROMIA ANEMIA. EOSINOPHILIA. The bone marrow shows slight increase in atypical plasma cells representing 7% of all cells associated with small clusters in the clot and biopsy sections. Immunohistochemical stains highlight the plasma cell component in the bone marrow which shows lambda light chain restriction consistent with plasma cell neoplasm. The background shows trilineage hematopoiesis with non-specific myeloid changes. The peripheral blood shows eosinophilia without an associated increase in eosinophils in the bone marrow. The changes may represent a hypersensitivity reaction. Cytogenetics/FISH: 46,XY -- Positive for +11, +17/17p+ -- extra CCND1 copy in 9% cells, gain of ATM copy in 9% & 11% cells with  p53 gain.        03/16/2017 -  Chemotherapy    Induction chemotherapy: Lenalidomide 4m PO Qday, d1-14 + Bortezomib 1.326mm2 IV d1,4,8,11 + dexamethasone 2055mO QWk Q21d --Cycle #1, 09/45/40/98omplicated by grade 2 rash within 1 week of initiation, as well as a aggressive rising creatinine; d11 held due to worsening rash --Cycle #2, 04/13/17: Initiated without lenalidomide due to persistent Grade 3 anemia --Cycle #3, 05/18/17: dosing changed to weekly bortezomib, cycle duration extended to 4 weeks --Cycle #4, 06/15/17: --Cycle #5, 07/13/17:       06/08/2017 Pathology Results    BM Bx:   --Pathology: Plasma cells are down to 1% with polyclonal appearance. --CytoGen: Normal, 46,XY      06/15/2017 Tumor Marker    tProt 7.1, Alb 3.8, Ca 9.7, Cr 1.6, AP ... LDH 153, beta-2 microglobulin 3.3 SPEP -- No M-spike; SIFE -- normal IgG 688, IgA 105, IgM 47; kappa 50.7, lambda 19.3, KLR 2.63 WBC 3.6, ANC 2.1, Hgb 9.4, Plt 204       Medical History: Past Medical History:  Diagnosis Date  . Anemia   . Arthritis    shoulders,feet   . Cataract    per eye dr -has appt 5-11  . CKD (chronic kidney disease), [place] III [place] . Elevated PSA   . History of ketoacidosis    03-29-2015   . History of sepsis    07-25-2013  non-compliant w/ medication  . Hyperlipidemia   . Hypertension   . Nocturia   . Peripheral neuropathy   . [place] . [facility]   Surgical History: Past Surgical History:  Procedure Laterality Date  . CIRCUMCISION  1990's  . PROSTATE BIOPSY N/A 12/21/2015   Procedure: BIOPSY TRANSRECTAL ULTRASONIC PROSTATE (TUBP);  Surgeon: MatFestus AloeD;  [facility];  Laterality: N/A;    Family History: Family History  Problem Relation Age of Onset  . Cancer Neg Hx   . Colon cancer Neg Hx   . Colon polyps Neg Hx   . Esophageal cancer Neg Hx   . Rectal cancer Neg Hx   . Stomach cancer Neg Hx     Social History: Social History   Socioeconomic History  . Marital status: Single    Spouse name: Not on file  . Number of children: Not on file  . Years of education: Not on file  . Highest education level: Not on file  Social Needs  . Financial resource strain: Not on file  . Food insecurity - worry: Not on file  . Food insecurity - inability: Not on file  . Transportation needs - medical: Not on file  . Transportation needs - non-medical: Not on file  Occupational History  . Not on file  Tobacco Use  . Smoking status: Current Every Day Smoker     Packs/day: 0.25    Years: 50.00    Pack years: 12.50    Types: Cigarettes  . Smokeless tobacco: Never Used  . Tobacco comment: 1 ppwk-4-5 a day   Substance and Sexual Activity  . Alcohol use: Yes    Alcohol/week: 0.6 oz    Types: 1 Cans of beer per week    Comment: 1 every day-quit couple weeks ago  . Drug use: No  . Sexual activity: Not Currently  Other Topics Concern  . Not on file  Social History Narrative  . Not  on file    Allergies: No Known Allergies  Medications:  Current Outpatient Medications  Medication Sig Dispense Refill  . ACCU-CHEK AVIVA PLUS test strip See admin instructions.  12  . acyclovir (ZOVIRAX) 400 MG tablet Take 1 tablet (400 mg total) by mouth 2 (two) times daily. 60 tablet 3  . amLODipine (NORVASC) 5 MG tablet Take 1 tablet (5 mg total) by mouth daily. 90 tablet 3  . Ascorbic Acid (VITAMIN C) 1000 MG tablet Take 1,000 mg by mouth daily.    [name] aspirin EC 81 MG tablet Take 1 tablet (81 mg total) by mouth daily. 90 tablet 3  . Cholecalciferol (VITAMIN D) 2000 units CAPS Take 2,000 Units by mouth daily.    . cyanocobalamin (V-R VITAMIN B-12) 500 MCG tablet Take 1 tablet (500 mcg total) by mouth 2 (two) times daily. 120 tablet 6  . dexamethasone (DECADRON) 4 MG tablet Take 2.5 tablets (10 mg total) by mouth 2 (two) times a week. 40 tablet 0  . ferrous sulfate 325 (65 FE) MG tablet Take 325 mg by mouth daily with breakfast.    . folic acid (FOLVITE) 1 MG tablet Take 1 tablet (1 mg total) by mouth daily. 60 tablet 0  . gabapentin (NEURONTIN) 300 MG capsule Take 1 capsule in the morning and at lunch. Take 2 capsules at night. 120 capsule 0  . insulin aspart protamine - aspart (NOVOLOG 70/30 MIX) (70-30) 100 UNIT/ML FlexPen Inject 0.05 mLs (5 Units total) into the skin 2 (two) times daily with a meal. (Patient taking differently: Inject 5 Units into the skin 2 (two) times daily with a meal. ) 6 mL 2  . lidocaine-prilocaine (EMLA) cream Apply to affected area once  30 g 3  . LORazepam (ATIVAN) 0.5 MG tablet Take 1 tablet (0.5 mg total) by mouth every 6 (six) hours as needed (Nausea or vomiting). 30 tablet 0  . mometasone (ELOCON) 0.1 % cream Apply 1 application topically daily. 45 g 3  . Multiple Vitamin (MULTIVITAMIN WITH MINERALS) TABS tablet Take 1 tablet by mouth daily. 60 tablet 2  . nicotine (NICODERM CQ - DOSED IN MG/24 HOURS) 14 mg/24hr patch Place 1 patch (14 mg total) onto the skin daily. 30 patch 0  . nicotine (NICODERM CQ - DOSED IN MG/24 HR) 7 mg/24hr patch Place 1 patch (7 mg total) onto the skin daily. 28 patch 0  . ondansetron (ZOFRAN) 8 MG tablet Take 1 tablet (8 mg total) by mouth 2 (two) times daily as needed (Nausea or vomiting). 30 tablet 1  . potassium chloride (K-DUR) 10 MEQ tablet Take 2 tablets (20 mEq total) by mouth daily. 60 tablet 0  . pravastatin (PRAVACHOL) 20 MG tablet Take 1 tablet (20 mg total) by mouth daily. 90 tablet 3  . prochlorperazine (COMPAZINE) 10 MG tablet Take 1 tablet (10 mg total) by mouth every 6 (six) hours as needed (Nausea or vomiting). 30 tablet 1  . senna-docusate (SENOKOT-S) 8.6-50 MG tablet Take 2 tablets by mouth 2 (two) times daily. 60 tablet 2  . sulfamethoxazole-trimethoprim (BACTRIM) 400-80 MG tablet Take 1 tablet by mouth daily. 21 tablet 1  . thiamine 100 MG tablet Take 1 tablet (100 mg total) by mouth daily. 60 tablet 0   Current Facility-Administered Medications  Medication Dose Route Frequency Provider Last Rate Last Dose  . 0.9 %  sodium chloride infusion  500 mL Intravenous Continuous Pyrtle, Lajuan Lines, MD        Review of Systems: Review  of Systems  Eyes: Negative.   Cardiovascular: Negative.   Gastrointestinal: Positive for constipation.  Endocrine: Negative.   Genitourinary: Negative.    Hematological: Negative.   Psychiatric/Behavioral: Negative.   All other systems reviewed and are negative.    PHYSICAL EXAMINATION Blood pressure 133/72, pulse 85, temperature 98.3 F (36.8 C),  temperature source Oral, resp. rate 18, height '5\' 4"'  (1.626 m), weight 161 lb 4.8 oz (73.2 kg), SpO2 100 %.  ECOG PERFORMANCE STATUS: 2 - Symptomatic, <50% confined to bed  Physical Exam  Constitutional: He is oriented to person, place, and time and well-developed, well-nourished, and in no distress. No distress.  HENT:  Head: Normocephalic and atraumatic.  Mouth/Throat: Oropharynx is clear and moist. No oropharyngeal exudate.  Eyes: Conjunctivae and EOM are normal. Pupils are equal, round, and reactive to light. No scleral icterus.  Neck: No thyromegaly present.  Cardiovascular: Normal rate and regular rhythm.  No murmur heard. Pulmonary/Chest: Effort normal and breath sounds normal. No respiratory distress. He has no wheezes.  Abdominal: Soft. He exhibits no distension and no mass. There is no tenderness.  Musculoskeletal: He exhibits no edema or tenderness.  Lymphadenopathy:    He has no cervical adenopathy.  Neurological: He is alert and oriented to person, place, and time. He has normal reflexes. No cranial nerve deficit.  Skin: Skin is warm and dry. No rash noted. He is not diaphoretic. No erythema.     LABORATORY DATA: I have personally reviewed the data as listed: Appointment on 07/13/2017  Component Date Value Ref Range Status  . WBC 07/13/2017 4.2  4.0 - 10.3 10e3/uL Final  . NEUT# 07/13/2017 2.4  1.5 - 6.5 10e3/uL Final  . HGB 07/13/2017 10.1* 13.0 - 17.1 g/dL Final  . HCT 07/13/2017 30.6* 38.4 - 49.9 % Final  . Platelets 07/13/2017 198  140 - 400 10e3/uL Final  . MCV 07/13/2017 96.2  79.3 - 98.0 fL Final  . MCH 07/13/2017 31.8  27.2 - 33.4 pg Final  . MCHC 07/13/2017 33.0  32.0 - 36.0 g/dL Final  . RBC 07/13/2017 3.18* 4.20 - 5.82 10e6/uL Final  . RDW 07/13/2017 14.4  11.0 - 14.6 % Final  . lymph# 07/13/2017 0.8* 0.9 - 3.3 10e3/uL Final  . MONO# 07/13/2017 0.4  0.1 - 0.9 10e3/uL Final  . Eosinophils Absolute 07/13/2017 0.5  0.0 - 0.5 10e3/uL Final  . Basophils  Absolute 07/13/2017 0.0  0.0 - 0.1 10e3/uL Final  . NEUT% 07/13/2017 58.0  39.0 - 75.0 % Final  . LYMPH% 07/13/2017 19.8  14.0 - 49.0 % Final  . MONO% 07/13/2017 10.5  0.0 - 14.0 % Final  . EOS% 07/13/2017 11.2* 0.0 - 7.0 % Final  . BASO% 07/13/2017 0.5  0.0 - 2.0 % Final  . Sodium 07/13/2017 137  136 - 145 mEq/L Final  . Potassium 07/13/2017 3.3* 3.5 - 5.1 mEq/L Final  . Chloride 07/13/2017 104  98 - 109 mEq/L Final  . CO2 07/13/2017 25  22 - 29 mEq/L Final  . Glucose 07/13/2017 148* 70 - 140 mg/dl Final   Glucose reference range is for nonfasting patients. Fasting glucose reference range is 70- 100.  [name] BUN 07/13/2017 23.8  7.0 - 26.0 mg/dL Final  . Creatinine 07/13/2017 1.7* 0.7 - 1.3 mg/dL Final  . Total Bilirubin 07/13/2017 0.45  0.20 - 1.20 mg/dL Final  . Alkaline Phosphatase 07/13/2017 119  40 - 150 U/L Final  . AST 07/13/2017 14  5 - 34 U/L Final  .  ALT 07/13/2017 13  0 - 55 U/L Final  . Total Protein 07/13/2017 6.8  6.4 - 8.3 g/dL Final  . Albumin 07/13/2017 3.8  3.5 - 5.0 g/dL Final  . Calcium 07/13/2017 9.3  8.4 - 10.4 mg/dL Final  . Anion Gap 07/13/2017 8  3 - 11 mEq/L Final  . EGFR 07/13/2017 47* >60 ml/min/1.73 m2 Final   eGFR is calculated using the CKD-EPI Creatinine Equation (2009)  . Magnesium 07/13/2017 2.5  1.5 - 2.5 mg/dl Final  . Phosphorus, Ser 07/13/2017 3.6  2.5 - 4.5 mg/dL Final       [name], MD

## 2017-07-24 ENCOUNTER — Other Ambulatory Visit: Payer: Self-pay | Admitting: Internal Medicine

## 2017-07-24 MED ORDER — INSULIN ASPART PROT & ASPART (70-30 MIX) 100 UNIT/ML PEN: 7.0000 [IU] | PEN_INJECTOR | Freq: Two times a day (BID) | SUBCUTANEOUS | 2 refills | Status: DC

## 2017-07-24 MED FILL — NOVOLOG MIX 70-30 FLEXPEN S: (70-30) 100 | 42 days supply | Qty: 6 | Fill #0

## 2017-07-25 ENCOUNTER — Telehealth: Payer: Self-pay | Admitting: Internal Medicine

## 2017-07-25 NOTE — Telephone Encounter (Signed)
Pt. Came to facility requesting for his PCP to increase the dosage on his insulin. Pt. States that he is a cancer pt. And he takes steroids and makes his blood sugar go up. Please f/u with pt.

## 2017-07-25 NOTE — Telephone Encounter (Signed)
Contacted pt to make him aware that [name] sent a new rx for insulin yesterday to the pharmacy increasing his insulin. Pt didn't pickup and was unable to lvm

## 2017-07-26 ENCOUNTER — Other Ambulatory Visit: Payer: Self-pay

## 2017-07-26 DIAGNOSIS — C61 Malignant neoplasm of prostate: Secondary | ICD-10-CM

## 2017-07-27 ENCOUNTER — Inpatient Hospital Stay: Payer: Medicare HMO

## 2017-07-27 VITALS — BP 126/72 | HR 81 | Temp 98.2°F | Resp 16

## 2017-07-27 DIAGNOSIS — C61 Malignant neoplasm of prostate: Secondary | ICD-10-CM

## 2017-07-27 DIAGNOSIS — Z5112 Encounter for antineoplastic immunotherapy: Secondary | ICD-10-CM | POA: Diagnosis not present

## 2017-07-27 DIAGNOSIS — C9001 Multiple myeloma in remission: Secondary | ICD-10-CM

## 2017-07-27 DIAGNOSIS — C9000 Multiple myeloma not having achieved remission: Secondary | ICD-10-CM | POA: Diagnosis not present

## 2017-07-27 LAB — CMP (CANCER CENTER ONLY)
ALK PHOS: 71 U/L (ref 40–150)
ALT: 13 U/L (ref 0–55)
ANION GAP: 9 (ref 3–11)
AST: 12 U/L (ref 5–34)
Albumin: 3.8 g/dL (ref 3.5–5.0)
BILIRUBIN TOTAL: 0.4 mg/dL (ref 0.2–1.2)
BUN: 32 mg/dL — ABNORMAL HIGH (ref 7–26)
CALCIUM: 9.2 mg/dL (ref 8.4–10.4)
CO2: 25 mmol/L (ref 22–29)
CREATININE: 1.8 mg/dL — AB (ref 0.70–1.30)
Chloride: 98 mmol/L (ref 98–109)
GFR, EST AFRICAN AMERICAN: 43 mL/min — AB (ref >60)
GFR, Estimated: 37 mL/min — ABNORMAL LOW (ref >60)
Glucose, Bld: 240 mg/dL — ABNORMAL HIGH (ref 70–140)
Potassium: 4.8 mmol/L (ref 3.5–5.1)
SODIUM: 132 mmol/L — AB (ref 136–145)
TOTAL PROTEIN: 6.7 g/dL (ref 6.4–8.3)

## 2017-07-27 LAB — CBC WITH DIFFERENTIAL (CANCER CENTER ONLY)
BASOS ABS: 0 10*3/uL (ref 0.0–0.1)
BASOS PCT: 0 %
Eosinophils Absolute: 0.5 10*3/uL (ref 0.0–0.5)
Eosinophils Relative: 12 %
HCT: 31.6 % — ABNORMAL LOW (ref 38.4–49.9)
HEMOGLOBIN: 10.5 g/dL — AB (ref 13.0–17.1)
Lymphocytes Relative: 18 %
Lymphs Abs: 0.8 10*3/uL — ABNORMAL LOW (ref 0.9–3.3)
MCH: 31.9 pg (ref 27.2–33.4)
MCHC: 33.2 g/dL (ref 32.0–36.0)
MCV: 96 fL (ref 79.3–98.0)
MONO ABS: 0.5 10*3/uL (ref 0.1–0.9)
MONOS PCT: 11 %
NEUTROS PCT: 59 %
Neutro Abs: 2.6 10*3/uL (ref 1.5–6.5)
Platelet Count: 184 10*3/uL (ref 140–400)
RBC: 3.29 MIL/uL — ABNORMAL LOW (ref 4.20–5.82)
RDW: 14.4 % (ref 11.0–15.6)
WBC Count: 4.4 10*3/uL (ref 4.0–10.3)

## 2017-07-27 LAB — PHOSPHORUS: Phosphorus: 4.1 mg/dL (ref 2.5–4.6)

## 2017-07-27 LAB — MAGNESIUM: Magnesium: 3 mg/dL — ABNORMAL HIGH (ref 1.5–2.5)

## 2017-07-27 MED ORDER — PROCHLORPERAZINE MALEATE 10 MG PO TABS
10.0000 mg | ORAL_TABLET | Freq: Once | ORAL | Status: AC
  Administered 2017-07-27: 10 mg via ORAL

## 2017-07-27 MED ORDER — ZOLEDRONIC ACID 4 MG/5ML IV CONC
3.3000 mg | Freq: Once | INTRAVENOUS | Status: AC
  Administered 2017-07-27: 3.3 mg via INTRAVENOUS
  Filled 2017-07-27: qty 4.13

## 2017-07-27 MED ORDER — BORTEZOMIB CHEMO SQ INJECTION 3.5 MG (2.5MG/ML)
1.3000 mg/m2 | Freq: Once | INTRAMUSCULAR | Status: AC
  Administered 2017-07-27: 2.25 mg via SUBCUTANEOUS
  Filled 2017-07-27: qty 2.25

## 2017-07-27 MED ORDER — PROCHLORPERAZINE MALEATE 10 MG PO TABS
ORAL_TABLET | ORAL | Status: AC
  Filled 2017-07-27: qty 1

## 2017-07-27 NOTE — Patient Instructions (Signed)
[facility] Discharge Instructions for Patients Receiving Chemotherapy  Today you received the following chemotherapy agents Velcade.  To help prevent nausea and vomiting after your treatment, we encourage you to take your nausea medication as directed.  If you develop nausea and vomiting that is not controlled by your nausea medication, call the clinic.   BELOW ARE SYMPTOMS THAT SHOULD BE REPORTED IMMEDIATELY:  *FEVER GREATER THAN 100.5 F  *CHILLS WITH OR WITHOUT FEVER  NAUSEA AND VOMITING THAT IS NOT CONTROLLED WITH YOUR NAUSEA MEDICATION  *UNUSUAL SHORTNESS OF BREATH  *UNUSUAL BRUISING OR BLEEDING  TENDERNESS IN MOUTH AND THROAT WITH OR WITHOUT PRESENCE OF ULCERS  *URINARY PROBLEMS  *BOWEL PROBLEMS  UNUSUAL RASH Items with * indicate a potential emergency and should be followed up as soon as possible.  Feel free to call the clinic should you have any questions or concerns. The clinic phone number is [phone].  Please show the CHEMO ALERT CARD at check-in to the Emergency Department and triage nurse.   

## 2017-07-27 NOTE — Progress Notes (Signed)
Per Ardyth Harps, RN, Dr. Lebron Conners advised ok to treat with current creatinine.

## 2017-07-30 DIAGNOSIS — D631 Anemia in chronic kidney disease: Secondary | ICD-10-CM | POA: Diagnosis not present

## 2017-07-30 DIAGNOSIS — N183 Chronic kidney disease, stage 3 (moderate): Secondary | ICD-10-CM | POA: Diagnosis not present

## 2017-07-30 DIAGNOSIS — N2581 Secondary hyperparathyroidism of renal origin: Secondary | ICD-10-CM | POA: Diagnosis not present

## 2017-07-31 MED FILL — ACCU-CHEK AVIVA PLUS TEST S: 30 days supply | Qty: 100 | Fill #13

## 2017-08-03 ENCOUNTER — Inpatient Hospital Stay: Payer: Medicare HMO

## 2017-08-03 ENCOUNTER — Other Ambulatory Visit: Payer: Self-pay

## 2017-08-03 VITALS — BP 121/72 | HR 66 | Temp 97.9°F | Resp 17

## 2017-08-03 DIAGNOSIS — Z5112 Encounter for antineoplastic immunotherapy: Secondary | ICD-10-CM | POA: Diagnosis not present

## 2017-08-03 DIAGNOSIS — C9001 Multiple myeloma in remission: Secondary | ICD-10-CM

## 2017-08-03 DIAGNOSIS — C9000 Multiple myeloma not having achieved remission: Secondary | ICD-10-CM | POA: Diagnosis not present

## 2017-08-03 LAB — CMP (CANCER CENTER ONLY)
ALBUMIN: 3.8 g/dL (ref 3.5–5.0)
ALT: 13 U/L (ref 0–55)
ANION GAP: 12 — AB (ref 3–11)
AST: 16 U/L (ref 5–34)
Alkaline Phosphatase: 106 U/L (ref 40–150)
BILIRUBIN TOTAL: 0.4 mg/dL (ref 0.2–1.2)
BUN: 30 mg/dL — ABNORMAL HIGH (ref 7–26)
CHLORIDE: 104 mmol/L (ref 98–109)
CO2: 18 mmol/L — AB (ref 22–29)
Calcium: 9.4 mg/dL (ref 8.4–10.4)
Creatinine: 2.17 mg/dL — ABNORMAL HIGH (ref 0.70–1.30)
GFR, Est AFR Am: 34 mL/min — ABNORMAL LOW (ref >60)
GFR, Estimated: 29 mL/min — ABNORMAL LOW (ref >60)
GLUCOSE: 155 mg/dL — AB (ref 70–140)
POTASSIUM: 4.6 mmol/L (ref 3.5–5.1)
SODIUM: 134 mmol/L — AB (ref 136–145)
TOTAL PROTEIN: 7.3 g/dL (ref 6.4–8.3)

## 2017-08-03 LAB — CBC WITH DIFFERENTIAL (CANCER CENTER ONLY)
BASOS ABS: 0 10*3/uL (ref 0.0–0.1)
Basophils Relative: 1 %
Eosinophils Absolute: 0.5 10*3/uL (ref 0.0–0.5)
Eosinophils Relative: 12 %
HCT: 32.6 % — ABNORMAL LOW (ref 38.4–49.9)
Hemoglobin: 10.7 g/dL — ABNORMAL LOW (ref 13.0–17.1)
LYMPHS PCT: 20 %
Lymphs Abs: 0.8 10*3/uL — ABNORMAL LOW (ref 0.9–3.3)
MCH: 31.9 pg (ref 27.2–33.4)
MCHC: 33 g/dL (ref 32.0–36.0)
MCV: 96.7 fL (ref 79.3–98.0)
MONO ABS: 0.3 10*3/uL (ref 0.1–0.9)
Monocytes Relative: 8 %
NEUTROS ABS: 2.4 10*3/uL (ref 1.5–6.5)
Neutrophils Relative %: 59 %
Platelet Count: 239 10*3/uL (ref 140–400)
RBC: 3.37 MIL/uL — ABNORMAL LOW (ref 4.20–5.82)
RDW: 15.1 % (ref 11.0–15.6)
WBC Count: 4.1 10*3/uL (ref 4.0–10.3)

## 2017-08-03 LAB — PHOSPHORUS: Phosphorus: 4 mg/dL (ref 2.5–4.6)

## 2017-08-03 LAB — MAGNESIUM: MAGNESIUM: 2.9 mg/dL — AB (ref 1.5–2.5)

## 2017-08-03 MED ORDER — PROCHLORPERAZINE MALEATE 10 MG PO TABS
10.0000 mg | ORAL_TABLET | Freq: Once | ORAL | Status: AC
  Administered 2017-08-03: 10 mg via ORAL

## 2017-08-03 MED ORDER — BORTEZOMIB CHEMO SQ INJECTION 3.5 MG (2.5MG/ML)
1.3000 mg/m2 | Freq: Once | INTRAMUSCULAR | Status: AC
  Administered 2017-08-03: 2.25 mg via SUBCUTANEOUS
  Filled 2017-08-03: qty 2.25

## 2017-08-03 MED ORDER — PROCHLORPERAZINE MALEATE 10 MG PO TABS
ORAL_TABLET | ORAL | Status: AC
  Filled 2017-08-03: qty 1

## 2017-08-03 NOTE — Patient Instructions (Signed)
[facility] Discharge Instructions for Patients Receiving Chemotherapy  Today you received the following chemotherapy agents Velcade.  To help prevent nausea and vomiting after your treatment, we encourage you to take your nausea medication as directed.  If you develop nausea and vomiting that is not controlled by your nausea medication, call the clinic.   BELOW ARE SYMPTOMS THAT SHOULD BE REPORTED IMMEDIATELY:  *FEVER GREATER THAN 100.5 F  *CHILLS WITH OR WITHOUT FEVER  NAUSEA AND VOMITING THAT IS NOT CONTROLLED WITH YOUR NAUSEA MEDICATION  *UNUSUAL SHORTNESS OF BREATH  *UNUSUAL BRUISING OR BLEEDING  TENDERNESS IN MOUTH AND THROAT WITH OR WITHOUT PRESENCE OF ULCERS  *URINARY PROBLEMS  *BOWEL PROBLEMS  UNUSUAL RASH Items with * indicate a potential emergency and should be followed up as soon as possible.  Feel free to call the clinic should you have any questions or concerns. The clinic phone number is [phone].  Please show the CHEMO ALERT CARD at check-in to the Emergency Department and triage nurse.   

## 2017-08-03 NOTE — Progress Notes (Signed)
Ok to proceed with treatment with 08/03/2017 lab results per Dr. Lebron Conners.

## 2017-08-06 ENCOUNTER — Other Ambulatory Visit: Payer: Self-pay | Admitting: Hematology and Oncology

## 2017-08-06 ENCOUNTER — Other Ambulatory Visit: Payer: Self-pay | Admitting: Internal Medicine

## 2017-08-06 DIAGNOSIS — C9000 Multiple myeloma not having achieved remission: Secondary | ICD-10-CM

## 2017-08-06 MED FILL — GABAPENTIN 300 MG CAPSULE: 300 | 30 days supply | Qty: 120 | Fill #0

## 2017-08-06 MED FILL — DEXAMETHASONE 4 MG TABLET: 4 | 21 days supply | Qty: 15 | Fill #0

## 2017-08-07 DIAGNOSIS — N2581 Secondary hyperparathyroidism of renal origin: Secondary | ICD-10-CM | POA: Diagnosis not present

## 2017-08-07 DIAGNOSIS — I129 Hypertensive chronic kidney disease with stage 1 through stage 4 chronic kidney disease, or unspecified chronic kidney disease: Secondary | ICD-10-CM | POA: Diagnosis not present

## 2017-08-07 DIAGNOSIS — C9000 Multiple myeloma not having achieved remission: Secondary | ICD-10-CM | POA: Diagnosis not present

## 2017-08-07 DIAGNOSIS — N183 Chronic kidney disease, stage 3 (moderate): Secondary | ICD-10-CM | POA: Diagnosis not present

## 2017-08-07 DIAGNOSIS — D631 Anemia in chronic kidney disease: Secondary | ICD-10-CM | POA: Diagnosis not present

## 2017-08-07 MED FILL — VIT D2 1.25 MG (50,000 UNIT: 1.25 MG | 28 days supply | Qty: 4 | Fill #0

## 2017-08-08 ENCOUNTER — Encounter: Payer: Self-pay | Admitting: Podiatry

## 2017-08-08 ENCOUNTER — Ambulatory Visit (INDEPENDENT_AMBULATORY_CARE_PROVIDER_SITE_OTHER): Payer: Medicare HMO | Admitting: Podiatry

## 2017-08-08 DIAGNOSIS — B351 Tinea unguium: Secondary | ICD-10-CM

## 2017-08-08 DIAGNOSIS — L989 Disorder of the skin and subcutaneous tissue, unspecified: Secondary | ICD-10-CM | POA: Diagnosis not present

## 2017-08-08 DIAGNOSIS — M79676 Pain in unspecified toe(s): Secondary | ICD-10-CM

## 2017-08-08 DIAGNOSIS — E0842 Diabetes mellitus due to underlying condition with diabetic polyneuropathy: Secondary | ICD-10-CM | POA: Diagnosis not present

## 2017-08-10 ENCOUNTER — Encounter: Payer: Self-pay | Admitting: Hematology and Oncology

## 2017-08-10 ENCOUNTER — Inpatient Hospital Stay: Payer: Medicare HMO

## 2017-08-10 ENCOUNTER — Inpatient Hospital Stay: Payer: Medicare HMO | Attending: Hematology and Oncology

## 2017-08-10 ENCOUNTER — Inpatient Hospital Stay (HOSPITAL_BASED_OUTPATIENT_CLINIC_OR_DEPARTMENT_OTHER): Payer: Medicare HMO | Admitting: Hematology and Oncology

## 2017-08-10 ENCOUNTER — Telehealth: Payer: Self-pay | Admitting: Hematology and Oncology

## 2017-08-10 VITALS — BP 138/70 | HR 81 | Temp 98.2°F | Resp 20 | Ht 64.0 in | Wt 154.2 lb

## 2017-08-10 DIAGNOSIS — R5383 Other fatigue: Secondary | ICD-10-CM | POA: Insufficient documentation

## 2017-08-10 DIAGNOSIS — C9001 Multiple myeloma in remission: Secondary | ICD-10-CM | POA: Insufficient documentation

## 2017-08-10 DIAGNOSIS — Z5112 Encounter for antineoplastic immunotherapy: Secondary | ICD-10-CM | POA: Diagnosis not present

## 2017-08-10 DIAGNOSIS — D649 Anemia, unspecified: Secondary | ICD-10-CM | POA: Insufficient documentation

## 2017-08-10 DIAGNOSIS — Z5111 Encounter for antineoplastic chemotherapy: Secondary | ICD-10-CM

## 2017-08-10 DIAGNOSIS — C9000 Multiple myeloma not having achieved remission: Secondary | ICD-10-CM

## 2017-08-10 LAB — PHOSPHORUS: Phosphorus: 3.7 mg/dL (ref 2.5–4.6)

## 2017-08-10 LAB — CBC WITH DIFFERENTIAL/PLATELET
BASOS ABS: 0 10*3/uL (ref 0.0–0.1)
BASOS PCT: 0 %
EOS ABS: 0.2 10*3/uL (ref 0.0–0.5)
EOS PCT: 3 %
HCT: 27.9 % — ABNORMAL LOW (ref 38.4–49.9)
Hemoglobin: 9.4 g/dL — ABNORMAL LOW (ref 13.0–17.1)
Lymphocytes Relative: 11 %
Lymphs Abs: 0.7 10*3/uL — ABNORMAL LOW (ref 0.9–3.3)
MCH: 32.1 pg (ref 27.2–33.4)
MCHC: 33.5 g/dL (ref 32.0–36.0)
MCV: 95.6 fL (ref 79.3–98.0)
Monocytes Absolute: 0.4 10*3/uL (ref 0.1–0.9)
Monocytes Relative: 7 %
Neutro Abs: 5.1 10*3/uL (ref 1.5–6.5)
Neutrophils Relative %: 79 %
PLATELETS: 228 10*3/uL (ref 140–400)
RBC: 2.92 MIL/uL — ABNORMAL LOW (ref 4.20–5.82)
RDW: 14.9 % — ABNORMAL HIGH (ref 11.0–14.6)
WBC: 6.5 10*3/uL (ref 4.0–10.3)

## 2017-08-10 LAB — COMPREHENSIVE METABOLIC PANEL
ALBUMIN: 3.9 g/dL (ref 3.5–5.0)
ALK PHOS: 172 U/L — AB (ref 40–150)
ALT: 10 U/L (ref 0–55)
AST: 12 U/L (ref 5–34)
Anion gap: 10 (ref 3–11)
BILIRUBIN TOTAL: 0.4 mg/dL (ref 0.2–1.2)
BUN: 43 mg/dL — AB (ref 7–26)
CALCIUM: 9.5 mg/dL (ref 8.4–10.4)
CO2: 19 mmol/L — ABNORMAL LOW (ref 22–29)
Chloride: 103 mmol/L (ref 98–109)
Creatinine, Ser: 2.27 mg/dL — ABNORMAL HIGH (ref 0.70–1.30)
GFR calc Af Amer: 32 mL/min — ABNORMAL LOW (ref >60)
GFR, EST NON AFRICAN AMERICAN: 28 mL/min — AB (ref >60)
GLUCOSE: 224 mg/dL — AB (ref 70–140)
Potassium: 4.1 mmol/L (ref 3.5–5.1)
Sodium: 132 mmol/L — ABNORMAL LOW (ref 136–145)
TOTAL PROTEIN: 7 g/dL (ref 6.4–8.3)

## 2017-08-10 LAB — MAGNESIUM: Magnesium: 2.6 mg/dL — ABNORMAL HIGH (ref 1.5–2.5)

## 2017-08-10 LAB — LACTATE DEHYDROGENASE: LDH: 144 U/L (ref 125–245)

## 2017-08-10 MED ORDER — PROCHLORPERAZINE MALEATE 10 MG PO TABS
ORAL_TABLET | ORAL | Status: AC
  Filled 2017-08-10: qty 1

## 2017-08-10 MED ORDER — PROCHLORPERAZINE MALEATE 10 MG PO TABS
10.0000 mg | ORAL_TABLET | Freq: Once | ORAL | Status: AC
  Administered 2017-08-10: 10 mg via ORAL

## 2017-08-10 MED ORDER — BORTEZOMIB CHEMO SQ INJECTION 3.5 MG (2.5MG/ML)
1.3000 mg/m2 | Freq: Once | INTRAMUSCULAR | Status: AC
  Administered 2017-08-10: 2.25 mg via SUBCUTANEOUS
  Filled 2017-08-10: qty 0.9

## 2017-08-10 NOTE — Telephone Encounter (Signed)
Gave patient avs report and appointments for February and March. Central radiology will call re bmbx.

## 2017-08-10 NOTE — Patient Instructions (Signed)
[facility] Discharge Instructions for Patients Receiving Chemotherapy  Today you received the following chemotherapy agents Velcade.  To help prevent nausea and vomiting after your treatment, we encourage you to take your nausea medication as directed.  If you develop nausea and vomiting that is not controlled by your nausea medication, call the clinic.   BELOW ARE SYMPTOMS THAT SHOULD BE REPORTED IMMEDIATELY:  *FEVER GREATER THAN 100.5 F  *CHILLS WITH OR WITHOUT FEVER  NAUSEA AND VOMITING THAT IS NOT CONTROLLED WITH YOUR NAUSEA MEDICATION  *UNUSUAL SHORTNESS OF BREATH  *UNUSUAL BRUISING OR BLEEDING  TENDERNESS IN MOUTH AND THROAT WITH OR WITHOUT PRESENCE OF ULCERS  *URINARY PROBLEMS  *BOWEL PROBLEMS  UNUSUAL RASH Items with * indicate a potential emergency and should be followed up as soon as possible.  Feel free to call the clinic should you have any questions or concerns. The clinic phone number is [phone].  Please show the CHEMO ALERT CARD at check-in to the Emergency Department and triage nurse.   

## 2017-08-10 NOTE — Progress Notes (Signed)
Per Erasmo Downer, RN Dr Lebron Conners is ok to treat patient with elevated Creat. He will no longer be receiving zometa.

## 2017-08-11 LAB — BETA 2 MICROGLOBULIN, SERUM: Beta-2 Microglobulin: 3.6 mg/L — ABNORMAL HIGH (ref 0.6–2.4)

## 2017-08-11 LAB — IGG, IGA, IGM
IGG (IMMUNOGLOBIN G), SERUM: 771 mg/dL (ref 700–1600)
IgA: 123 mg/dL (ref 61–437)
IgM (Immunoglobulin M), Srm: 55 mg/dL (ref 20–172)

## 2017-08-11 NOTE — Assessment & Plan Note (Addendum)
72 y.o. male with diagnosis of IgA lambda active multiple myeloma, ISS Stage I. Active disease diagnosed based on presence of anemia, kidney insufficiency, and paraproteinemia with significant predominance of lambda light chains as well as significant elevation of IgA. Bone marrow biopsy confirmed presence of atypical monoclonal plasma cell process in the bone marrow comprising at least 7% of the cellularity. Based on the findings, patient was started on treatment with lenalidomide, bortezomib, and low-dose dexamethasone based on the anticipated tolerance by the patient. Lenalidomide was started at 10 mg daily based on creatinine clearance of 42, dexamethasone dose was reduced to 20 mg weekly based on patient's age. Treatment was complicated by rapid development of cutaneous rash attributed to lenalidomide, inaddition to decreased renal function and now patient has persistent severe anemia. Side-effects were attributed to Lenalidomide and lenalidomide was discontinued with subsequent recovery.  Patient has been receiving bortezomib weekly with low-dose dexamethasone in 4-day cycles.  Patient is doing remarkably well with the current regimen.  Disease assessment after 3 cycles of therapy demonstrates complete response based on biochemical protein profile as well as bone marrow assessment showing less than 1% of plasma cells with polyclonal characteristics and no residual monoclonal plasma cell process.  Cytogenetics were also negative.  Patient presents to the clinic for possible initiation of the sixth cycle of therapy.  He is only complaint at this time is fatigue.  Clinical evaluation lab workup permissive to proceed with the next cycle of therapy  Plan: --Continue prophylactic acyclovir and low-dose Bactrim to reduce the risk of VZV reactivation and PCP infection respectively --Proceed with cycle #6 of bortezomib unchanged dosing. --Consult interventional radiology for bone marrow biopsy prior to return to  the clinic --Return to clinic in 4 weeks with labs and possible initiation of bortezomib maintenance therapy if complete response is maintained. -No further treatment with zoledronic acid due to deteriorated renal function that is at least partially attributable to zoledronic acid effect

## 2017-08-11 NOTE — Progress Notes (Signed)
[facility] Follow-up Visit:  Assessment: Multiple myeloma in remission [facility]) 72 y.o. male with diagnosis of IgA lambda active multiple myeloma, ISS Stage I. Active disease diagnosed based on presence of anemia, kidney insufficiency, and paraproteinemia with significant predominance of lambda light chains as well as significant elevation of IgA. Bone marrow biopsy confirmed presence of atypical monoclonal plasma cell process in the bone marrow comprising at least 7% of the cellularity. Based on the findings, patient was started on treatment with lenalidomide, bortezomib, and low-dose dexamethasone based on the anticipated tolerance by the patient. Lenalidomide was started at 10 mg daily based on creatinine clearance of 42, dexamethasone dose was reduced to 20 mg weekly based on patient's age. Treatment was complicated by rapid development of cutaneous rash attributed to lenalidomide, inaddition to decreased renal function and now patient has persistent severe anemia. Side-effects were attributed to Lenalidomide and lenalidomide was discontinued with subsequent recovery.  Patient has been receiving bortezomib weekly with low-dose dexamethasone in 4-day cycles.  Patient is doing remarkably well with the current regimen.  Disease assessment after 3 cycles of therapy demonstrates complete response based on biochemical protein profile as well as bone marrow assessment showing less than 1% of plasma cells with polyclonal characteristics and no residual monoclonal plasma cell process.  Cytogenetics were also negative.  Patient presents to the clinic for possible initiation of the sixth cycle of therapy.  He is only complaint at this time is fatigue.  Clinical evaluation lab workup permissive to proceed with the next cycle of therapy  Plan: --Continue prophylactic acyclovir and low-dose Bactrim to reduce the risk of VZV reactivation and PCP infection respectively --Proceed with cycle #6 of  bortezomib unchanged dosing. --Consult interventional radiology for bone marrow biopsy prior to return to the clinic --Return to clinic in 4 weeks with labs and possible initiation of bortezomib maintenance therapy if complete response is maintained. -No further treatment with zoledronic acid due to deteriorated renal function that is at least partially attributable to zoledronic acid effect  Voice recognition software was used and creation of this note. Despite my best effort at editing the text, some misspelling/errors may have occurred.  Orders Placed This Encounter  Procedures  . CT Biopsy    Standing Status:   Future    Standing Expiration Date:   08/10/2018    Order Specific Question:   Lab orders requested (DO NOT place separate lab orders, these will be automatically ordered during procedure specimen collection):    Answer:   Cytology - Non Pap    Comments:   Cytogenetics, MM FISH, molecular    Order Specific Question:   Lab orders requested (DO NOT place separate lab orders, these will be automatically ordered during procedure specimen collection):    Answer:   Surgical Pathology    Order Specific Question:   Lab orders requested (DO NOT place separate lab orders, these will be automatically ordered during procedure specimen collection):    Answer:   Other    Order Specific Question:   Reason for Exam (SYMPTOM  OR DIAGNOSIS REQUIRED)    Answer:   Disease assessment after completion of induction therapy for MM    Order Specific Question:   Preferred imaging location?    Answer:   [facility]    Order Specific Question:   Radiology Contrast Protocol - do NOT remove file path    Answer:   \\[email or web address]\[email or web address]\CTProtocols.pdf  . CT BONE MARROW BIOPSY & ASPIRATION    Standing  Status:   Future    Standing Expiration Date:   11/08/2018    Order Specific Question:   Reason for Exam (SYMPTOM  OR DIAGNOSIS REQUIRED)    Answer:   MM completing induction therpy, eval for  disease response    Order Specific Question:   Preferred imaging location?    Answer:   [facility]    Order Specific Question:   Radiology Contrast Protocol - do NOT remove file path    Answer:   \\[email or web address]\Radiant\CTProtocols.pdf  . CBC with Differential ([place] Only)    Standing Status:   Future    Standing Expiration Date:   08/10/2018  . CMP ([place] only)    Standing Status:   Future    Standing Expiration Date:   08/10/2018  . Magnesium    Standing Status:   Future    Standing Expiration Date:   08/10/2018  . Lactate dehydrogenase (LDH)    Standing Status:   Future    Standing Expiration Date:   08/10/2018  . Beta 2 microglobulin    Standing Status:   Future    Standing Expiration Date:   08/10/2018  . Multiple Myeloma Panel (SPEP&IFE w/QIG)    Standing Status:   Future    Standing Expiration Date:   08/10/2018  . Kappa/lambda light chains    Standing Status:   Future    Standing Expiration Date:   08/10/2018  . 24-Hr Ur UPEP/UIFE/Light Chains/TP    Standing Status:   Future    Standing Expiration Date:   08/10/2018    Cancer Staging Multiple myeloma in remission [facility]) Staging form: Plasma Cell Myeloma and Plasma Cell Disorders, AJCC 8th Edition - Clinical stage from 03/21/2017: RISS Stage I (Beta-2-microglobulin (mg/L): 3.2, Albumin (g/dL): 3.6, ISS: Stage I, High-risk cytogenetics: Absent, LDH: Normal) - Signed by [name], MD on 04/04/2017 - Clinical: No stage assigned - Unsigned   All questions were answered.  . The patient knows to call the clinic with any problems, questions or concerns.  This note was electronically signed.    History of Presenting Illness Robert Sparks is a 72 y.o. male followed in the [place] for new diagnosis of IgA lambda active multiple myeloma. At presentation, Robert Sparks reported having some night sweats, but no weight loss. He indicated weakness in bilateral thighs associated with the feet and legs.  Complained of constipation, but denies any numbness or tingling in hands and feet. Denied any new musculoskeletal pain. No chest pain, shortness of breath or cough. No nausea, vomiting, abdominal pain, diarrhea, or early satiety. Additional evaluation was conducted resulting in negative imaging including negative skeletal survey on PET/CT. Bone marrow biopsy returned positive with 7% involvement with atypical monoclonal plasma cells. In conjunction with kidney injury, anemia, dysproteinemia with significant elevation of lambda light chains and IgA, this confirmed a diagnosis of active multiple myeloma warranting intervention.   Patient presents to the clinic while receiving systemic therapy with combinational bortezomib and dexamethasone.  Patient reports feeling reasonably well and denies any new symptoms at this time.  Continues to complain of intermittent fatigue and back pain.  Occasional knee pain, but no new or progressive skeletal pain.  Oncological/hematological History: --Labs, 10/28/15: WBC 6.0, Hgb 10.3, Plt 245;   --Labs, 10/02/16: WBC 5.1, Hgb 10.0, Plt 227; ANA -- negative --Labs, 11/10/16: tProt 6.3, Alb 3.4, Ca   9.1, Cr 2.56, AP 82; WBC 4.7, Hgb 8.1, Plt 201; Fe 43, FeSat 16%, TIBC 272,  Ferritin 476, Vit B12 199, MMA 283; TSH 1.57 --Labs, 12/25/16: tProt 7.0, Alb 4.1, Ca   9.9, Cr 1.47, AP ...; SPEP -- M-spike 0.8g/dL; kappa 40.7, lambda 305.1, KLR 0.13; WBC 3.4, Hgb 13.2, Plt 154; --Labs, 02/09/17: tProt 8.0, Alb 3.9, Ca 10.1, Cr 1.80, AP 97; SPEP -- M-spike 0.7g.dL, IgA lambda; IgA 1220, IgG 666, IgM 52; kappa 33.5, lambda 433.5, KLR 0.08; WBC 6.3, Hgb 10.6, Plt 194 --Labs, 04/05/17: tProt 7.0, Alb 3.5, Ca   8.9, Cr 2.20, AP 75; SPEP -- M-spike 0.4g/dL, IgA lambda; IgA   433, IgG 683, IgM 52; kappa 59.7, lambda   42.7, KLR 1.40; WBC 5.0, Hgb   7.4, Plt 280;    Multiple myeloma in remission ([place])   02/16/2017 Imaging    Skeletal Survey: No definite lytic myelomatous lesions in the  axial or appendicular skeleton      02/23/2017 Initial Diagnosis    Multiple myeloma not having achieved remission ([place])      03/09/2017 Imaging    PET-CT: No abnormal hypermetabolic activity or other specific findings of active myeloma. Several adjacent healing left lateral rib fractures.      03/13/2017 Pathology Results    Bone Marrow Bx: HYPERCELLULAR BONE MARROW FOR AGE WITH PLASMA CELL NEOPLASM. TRILINEAGE HEMATOPOIESIS. NORMOCYTIC-NORMOCHROMIA ANEMIA. EOSINOPHILIA. The bone marrow shows slight increase in atypical plasma cells representing 7% of all cells associated with small clusters in the clot and biopsy sections. Immunohistochemical stains highlight the plasma cell component in the bone marrow which shows lambda light chain restriction consistent with plasma cell neoplasm. The background shows trilineage hematopoiesis with non-specific myeloid changes. The peripheral blood shows eosinophilia without an associated increase in eosinophils in the bone marrow. The changes may represent a hypersensitivity reaction. Cytogenetics/FISH: 46,XY -- Positive for +11, +17/17p+ -- extra CCND1 copy in 9% cells, gain of ATM copy in 9% & 11% cells with p53 gain.        03/16/2017 -  Chemotherapy    Induction chemotherapy: Lenalidomide 52m PO Qday, d1-14 + Bortezomib 1.317mm2 IV d1,4,8,11 + dexamethasone 2021mO QWk Q21d --Cycle #1, 09/46/80/32omplicated by grade 2 rash within 1 week of initiation, as well as a aggressive rising creatinine; d11 held due to worsening rash --Cycle #2, 04/13/17: Initiated without lenalidomide due to persistent Grade 3 anemia --Cycle #3, 05/18/17: dosing changed to weekly bortezomib, cycle duration extended to 4 weeks --Cycle #4, 06/15/17: --Cycle #5, 07/13/17: --Cycle #6, 08/10/17:       06/08/2017 Pathology Results    BM Bx:  --Pathology: Plasma cells are down to 1% with polyclonal appearance. --CytoGen: Normal, 46,XY      06/15/2017 Tumor Marker    tProt 7.1,  Alb 3.8, Ca 9.7, Cr 1.6, AP ... LDH 153, beta-2 microglobulin 3.3 SPEP -- No M-spike; SIFE -- normal IgG 688, IgA 105, IgM 47; kappa 50.7, lambda 19.3, KLR 2.63 WBC 3.6, ANC 2.1, Hgb 9.4, Plt 204      08/10/2017 Tumor Marker    tProt 7.0, Alb 3.9, Ca 9.5, Cr 2.3, AP 172 LDH 144, beta-2 microglobulin 3.6 SPEP -- pending IgG 771, IgA 123, IgM 55; kappa -- pending, lambda -- pending WBC 6.5, ANC 5.1, Hgb 9.4, Plt 228       Medical History: Past Medical History:  Diagnosis Date  . Anemia   . Arthritis    shoulders,feet   . Cataract    per eye dr -has appt 5-11  . CKD (chronic kidney disease), stage III ([place] .  Elevated PSA   . History of ketoacidosis    03-29-2015   . History of sepsis    07-25-2013  non-compliant w/ medication  . Hyperlipidemia   . Hypertension   . Nocturia   . Peripheral neuropathy   . Prostate cancer ([place])   . Type 2 diabetes mellitus with insulin therapy [facility])     Surgical History: Past Surgical History:  Procedure Laterality Date  . CIRCUMCISION  1990's  . PROSTATE BIOPSY N/A 12/21/2015   Procedure: BIOPSY TRANSRECTAL ULTRASONIC PROSTATE (TUBP);  Surgeon: Festus Aloe, MD;  Location: [facility];  Service: Urology;  Laterality: N/A;    Family History: Family History  Problem Relation Age of Onset  . Cancer Neg Hx   . Colon cancer Neg Hx   . Colon polyps Neg Hx   . Esophageal cancer Neg Hx   . Rectal cancer Neg Hx   . Stomach cancer Neg Hx     Social History: Social History   Socioeconomic History  . Marital status: Single    Spouse name: Not on file  . Number of children: Not on file  . Years of education: Not on file  . Highest education level: Not on file  Social Needs  . Financial resource strain: Not on file  . Food insecurity - worry: Not on file  . Food insecurity - inability: Not on file  . Transportation needs - medical: Not on file  . Transportation needs - non-medical: Not on file  Occupational  History  . Not on file  Tobacco Use  . Smoking status: Current Every Day Smoker    Packs/day: 0.25    Years: 50.00    Pack years: 12.50    Types: Cigarettes  . Smokeless tobacco: Never Used  . Tobacco comment: 1 ppwk-4-5 a day   Substance and Sexual Activity  . Alcohol use: Yes    Alcohol/week: 0.6 oz    Types: 1 Cans of beer per week    Comment: 1 every day-quit couple weeks ago  . Drug use: No  . Sexual activity: Not Currently  Other Topics Concern  . Not on file  Social History Narrative  . Not on file    Allergies: No Known Allergies  Medications:  Current Outpatient Medications  Medication Sig Dispense Refill  . ACCU-CHEK AVIVA PLUS test strip See admin instructions.  12  . acyclovir (ZOVIRAX) 400 MG tablet Take 1 tablet (400 mg total) by mouth 2 (two) times daily. 60 tablet 3  . amLODipine (NORVASC) 5 MG tablet Take 1 tablet (5 mg total) by mouth daily. 90 tablet 3  . Ascorbic Acid (VITAMIN C) 1000 MG tablet Take 1,000 mg by mouth daily.    [name] aspirin EC 81 MG tablet Take 1 tablet (81 mg total) by mouth daily. 90 tablet 3  . Cholecalciferol (VITAMIN D) 2000 units CAPS Take 2,000 Units by mouth daily.    . cyanocobalamin (V-R VITAMIN B-12) 500 MCG tablet Take 1 tablet (500 mcg total) by mouth 2 (two) times daily. 120 tablet 6  . dexamethasone (DECADRON) 4 MG tablet Take 2.5 tablets (10 mg total) by mouth 2 (two) times a week. 40 tablet 0  . dexamethasone (DECADRON) 4 MG tablet TAKE 5 TABLETS BY MOUTH ONCE A WEEK. 15 tablet 0  . ferrous sulfate 325 (65 FE) MG tablet Take 325 mg by mouth daily with breakfast.    . folic acid (FOLVITE) 1 MG tablet Take 1 tablet (1 mg total) by  mouth daily. 60 tablet 0  . gabapentin (NEURONTIN) 300 MG capsule TAKE 1 CAPSULE BY MOUTH IN THE MORNING AND AT LUNCH, TAKE 2 CAPSULES BY MOUTH AT NIGHT. 120 capsule 0  . insulin aspart protamine - aspart (NOVOLOG 70/30 MIX) (70-30) 100 UNIT/ML FlexPen Inject 0.07 mLs (7 Units total) into the skin 2  (two) times daily with a meal. 6 mL 2  . lidocaine-prilocaine (EMLA) cream Apply to affected area once 30 g 3  . LORazepam (ATIVAN) 0.5 MG tablet Take 1 tablet (0.5 mg total) by mouth every 6 (six) hours as needed (Nausea or vomiting). 30 tablet 0  . mometasone (ELOCON) 0.1 % cream Apply 1 application topically daily. 45 g 3  . Multiple Vitamin (MULTIVITAMIN WITH MINERALS) TABS tablet Take 1 tablet by mouth daily. 60 tablet 2  . nicotine (NICODERM CQ - DOSED IN MG/24 HOURS) 14 mg/24hr patch Place 1 patch (14 mg total) onto the skin daily. 30 patch 0  . nicotine (NICODERM CQ - DOSED IN MG/24 HR) 7 mg/24hr patch Place 1 patch (7 mg total) onto the skin daily. 28 patch 0  . ondansetron (ZOFRAN) 8 MG tablet Take 1 tablet (8 mg total) by mouth 2 (two) times daily as needed (Nausea or vomiting). 30 tablet 1  . potassium chloride (K-DUR) 10 MEQ tablet Take 2 tablets (20 mEq total) by mouth daily. 60 tablet 0  . pravastatin (PRAVACHOL) 20 MG tablet Take 1 tablet (20 mg total) by mouth daily. 90 tablet 3  . prochlorperazine (COMPAZINE) 10 MG tablet Take 1 tablet (10 mg total) by mouth every 6 (six) hours as needed (Nausea or vomiting). 30 tablet 1  . senna-docusate (SENOKOT-S) 8.6-50 MG tablet Take 2 tablets by mouth 2 (two) times daily. 60 tablet 2  . sulfamethoxazole-trimethoprim (BACTRIM) 400-80 MG tablet Take 1 tablet by mouth daily. 21 tablet 1  . thiamine 100 MG tablet Take 1 tablet (100 mg total) by mouth daily. 60 tablet 0   Current Facility-Administered Medications  Medication Dose Route Frequency Provider Last Rate Last Dose  . 0.9 %  sodium chloride infusion  500 mL Intravenous Continuous Pyrtle, Lajuan Lines, MD        Review of Systems: Review of Systems  Constitutional: Positive for fatigue.  Eyes: Negative.   Cardiovascular: Negative.   Endocrine: Negative.   Genitourinary: Negative.    Hematological: Negative.   Psychiatric/Behavioral: Negative.   All other systems reviewed and are  negative.    PHYSICAL EXAMINATION Blood pressure 138/70, pulse 81, temperature 98.2 F (36.8 C), temperature source Oral, resp. rate 20, height '5\' 4"'  (1.626 m), weight 154 lb 3.2 oz (69.9 kg), SpO2 100 %.  ECOG PERFORMANCE STATUS: 2 - Symptomatic, <50% confined to bed  Physical Exam  Constitutional: He is oriented to person, place, and time and well-developed, well-nourished, and in no distress. No distress.  HENT:  Head: Normocephalic and atraumatic.  Mouth/Throat: Oropharynx is clear and moist. No oropharyngeal exudate.  Eyes: Conjunctivae and EOM are normal. Pupils are equal, round, and reactive to light. No scleral icterus.  Neck: No thyromegaly present.  Cardiovascular: Normal rate and regular rhythm.  No murmur heard. Pulmonary/Chest: Effort normal and breath sounds normal. No respiratory distress. He has no wheezes.  Abdominal: Soft. He exhibits no distension and no mass. There is no tenderness.  Musculoskeletal: He exhibits no edema or tenderness.  Lymphadenopathy:    He has no cervical adenopathy.  Neurological: He is alert and oriented to person, place, and time.  He has normal reflexes. No cranial nerve deficit.  Skin: Skin is warm and dry. No rash noted. He is not diaphoretic. No erythema.     LABORATORY DATA: I have personally reviewed the data as listed: Appointment on 08/10/2017  Component Date Value Ref Range Status  . IgG (Immunoglobin G), Serum 08/10/2017 771  700 - 1,600 mg/dL Final  . IgA 08/10/2017 123  61 - 437 mg/dL Final  . IgM (Immunoglobulin M), Srm 08/10/2017 55  20 - 172 mg/dL Final   Comment: (NOTE) Performed At: [facility] [place], [place] [id number] Rush Farmer MD SJ:[id number] Performed at [facility], [place] [address] [place], [place] [address]   . Beta-2 Microglobulin 08/10/2017 3.6* 0.6 - 2.4 mg/L Final   Comment: (NOTE) Siemens Immulite 2000 Immunochemiluminometric assay (ICMA) Values  obtained with different assay methods or kits cannot be used interchangeably. Results cannot be interpreted as absolute evidence of the presence or absence of malignant disease. Performed At: [facility] [place], [place] [id number] Rush Farmer MD EX:[id number] Performed at [facility], [place] [address], [place], [place] [address]   . Phosphorus 08/10/2017 3.7  2.5 - 4.6 mg/dL Final   Performed at [facility] [address], [place], [place] [address]  . Magnesium 08/10/2017 2.6* 1.5 - 2.5 mg/dL Final   Performed at [facility], [place] [address], [place], [place] [address]  . LDH 08/10/2017 144  125 - 245 U/L Final   Performed at [facility], [place] [address] [place], [place] [address]  . Sodium 08/10/2017 132* 136 - 145 mmol/L Final  . Potassium 08/10/2017 4.1  3.5 - 5.1 mmol/L Final  . Chloride 08/10/2017 103  98 - 109 mmol/L Final  . CO2 08/10/2017 19* 22 - 29 mmol/L Final  . Glucose, Bld 08/10/2017 224* 70 - 140 mg/dL Final  . BUN 08/10/2017 43* 7 - 26 mg/dL Final  . Creatinine, Ser 08/10/2017 2.27* 0.70 - 1.30 mg/dL Final  . Calcium 08/10/2017 9.5  8.4 - 10.4 mg/dL Final  . Total Protein 08/10/2017 7.0  6.4 - 8.3 g/dL Final  . Albumin 08/10/2017 3.9  3.5 - 5.0 g/dL Final  . AST 08/10/2017 12  5 - 34 U/L Final  . ALT 08/10/2017 10  0 - 55 U/L Final  . Alkaline Phosphatase 08/10/2017 172* 40 - 150 U/L Final  . Total Bilirubin 08/10/2017 0.4  0.2 - 1.2 mg/dL Final  . GFR calc non Af Amer 08/10/2017 28* >60 mL/min Final  . GFR calc Af Amer 08/10/2017 32* >60 mL/min Final   Comment: (NOTE) The eGFR has been calculated using the CKD EPI equation. This calculation has not been validated in all clinical situations. eGFR's persistently <60 mL/min signify possible Chronic Kidney Disease.   Georgiann Hahn gap 08/10/2017 10  3 - 11 Final   Performed at [facility], [place] [address], [place], [place] [address]  . WBC 08/10/2017 6.5  4.0 - 10.3 K/uL Final  . RBC 08/10/2017 2.92* 4.20 - 5.82 MIL/uL Final  . Hemoglobin 08/10/2017 9.4* 13.0 - 17.1 g/dL Final  . HCT 08/10/2017 27.9* 38.4 - 49.9 % Final  . MCV 08/10/2017 95.6  79.3 - 98.0 fL Final  . MCH 08/10/2017 32.1  27.2 - 33.4 pg Final  . MCHC 08/10/2017 33.5  32.0 - 36.0 g/dL Final  . RDW 08/10/2017 14.9* 11.0 - 14.6 %  Final  . Platelets 08/10/2017 228  140 - 400 K/uL Final  . Neutrophils Relative % 08/10/2017 79  % Final  . Neutro Abs 08/10/2017 5.1  1.5 - 6.5 K/uL Final  . Lymphocytes Relative 08/10/2017 11  % Final  . Lymphs Abs 08/10/2017 0.7* 0.9 - 3.3 K/uL Final  . Monocytes Relative 08/10/2017 7  % Final  . Monocytes Absolute 08/10/2017 0.4  0.1 - 0.9 K/uL Final  . Eosinophils Relative 08/10/2017 3  % Final  . Eosinophils Absolute 08/10/2017 0.2  0.0 - 0.5 K/uL Final  . Basophils Relative 08/10/2017 0  % Final  . Basophils Absolute 08/10/2017 0.0  0.0 - 0.1 K/uL Final   Performed at [facility], [place] [address], [place], [place] [address]       [name], MD

## 2017-08-12 NOTE — Progress Notes (Signed)
    Subjective: Patient is a 72 y.o. male with PMHx of T2DM presenting to the office today for follow up evaluation of two painful callus lesions to the bilateral feet that have been present for several months. Walking and bearing weight increases the pain. Resting the feet and being nonweightbearing helps alleviate it.   Patient also complains of elongated, thickened nails that cause pain while ambulating in shoes. Patient is unable to trim their own nails. Patient presents today for further treatment and evaluation.   Past Medical History:  Diagnosis Date  . Anemia   . Arthritis    shoulders,feet   . Cataract    per eye dr -has appt 5-11  . CKD (chronic kidney disease), stage III ([place])   . Elevated PSA   . History of ketoacidosis    03-29-2015   . History of sepsis    07-25-2013  non-compliant w/ medication  . Hyperlipidemia   . Hypertension   . Nocturia   . Peripheral neuropathy   . Prostate cancer ([place])   . Type 2 diabetes mellitus with insulin therapy (HCC)     Objective:  Physical Exam General: Alert and oriented x3 in no acute distress  Dermatology: Hyperkeratotic lesions present on the bilateral feet x 2. Pain on palpation with a central nucleated core noted. Skin is warm, dry and supple bilateral lower extremities. Negative for open lesions or macerations. Nails are tender, long, thickened and dystrophic with subungual debris, consistent with onychomycosis, 1-5 bilateral. No signs of infection noted.  Vascular: Palpable pedal pulses bilaterally. No edema or erythema noted. Capillary refill within normal limits.  Neurological: Epicritic and protective threshold grossly intact bilaterally.   Musculoskeletal Exam: Pain on palpation at the keratotic lesion noted. Range of motion within normal limits bilateral. Muscle strength 5/5 in all groups bilateral.  Assessment: 1. Onychodystrophic nails 1-5 bilateral with hyperkeratosis of nails.  2. Onychomycosis of nail due to  dermatophyte bilateral 3. Pre-ulcerative callus lesions to the bilateral feet x 2   Plan of Care:  #1 Patient evaluated. #2 Excisional debridement of keratoic lesion using a chisel blade was performed without incident.  #3 Dressed with light dressing. #4 Mechanical debridement of nails 1-5 bilaterally performed using a nail nipper. Filed with dremel without incident.  #5 Prescription for pain cream to be dispensed by [facility] provided.  #6 Patient is to return to the clinic in 3 months.   Edrick Kins, DPM [facility]  Dr. Edrick Kins, [place]                                        [place], [place] [address]                Office [phone]  Fax [phone]

## 2017-08-13 ENCOUNTER — Other Ambulatory Visit (HOSPITAL_COMMUNITY): Payer: Self-pay | Admitting: *Deleted

## 2017-08-13 ENCOUNTER — Other Ambulatory Visit: Payer: Self-pay | Admitting: Hematology and Oncology

## 2017-08-13 ENCOUNTER — Other Ambulatory Visit: Payer: Self-pay

## 2017-08-13 DIAGNOSIS — C9000 Multiple myeloma not having achieved remission: Secondary | ICD-10-CM

## 2017-08-13 LAB — PROTEIN ELECTROPHORESIS, SERUM, WITH REFLEX
A/G Ratio: 1.4 (ref 0.7–1.7)
ALBUMIN ELP: 3.8 g/dL (ref 2.9–4.4)
ALPHA-1-GLOBULIN: 0.2 g/dL (ref 0.0–0.4)
Alpha-2-Globulin: 0.8 g/dL (ref 0.4–1.0)
Beta Globulin: 0.9 g/dL (ref 0.7–1.3)
Gamma Globulin: 0.8 g/dL (ref 0.4–1.8)
Globulin, Total: 2.7 g/dL (ref 2.2–3.9)
TOTAL PROTEIN ELP: 6.5 g/dL (ref 6.0–8.5)

## 2017-08-13 LAB — KAPPA/LAMBDA LIGHT CHAINS
Kappa free light chain: 43 mg/L — ABNORMAL HIGH (ref 3.3–19.4)
Kappa, lambda light chain ratio: 2.1 — ABNORMAL HIGH (ref 0.26–1.65)
Lambda free light chains: 20.5 mg/L (ref 5.7–26.3)

## 2017-08-13 MED ORDER — NONFORMULARY OR COMPOUNDED ITEM: 2 refills | Status: DC

## 2017-08-13 MED FILL — AMLODIPINE BESYLATE 5 MG TA: 5 | 30 days supply | Qty: 30 | Fill #7

## 2017-08-13 MED FILL — PRAVASTATIN NA 20 MG TAB: 20 | 30 days supply | Qty: 30 | Fill #7

## 2017-08-13 MED FILL — POTASSIUM CL 10 MEQ TAB SA: 10 | 7 days supply | Qty: 7 | Fill #0

## 2017-08-13 MED FILL — FERROUS SULFATE 325 MG TAB: 325 (65 FE) | 30 days supply | Qty: 30 | Fill #7

## 2017-08-14 ENCOUNTER — Ambulatory Visit (HOSPITAL_COMMUNITY)
Admission: RE | Admit: 2017-08-14 | Discharge: 2017-08-14 | Disposition: A | Discharge status: Home / Self Care | Payer: Medicare HMO | Source: Ambulatory Visit | Attending: Nephrology | Admitting: Nephrology

## 2017-08-14 DIAGNOSIS — D631 Anemia in chronic kidney disease: Secondary | ICD-10-CM | POA: Insufficient documentation

## 2017-08-14 DIAGNOSIS — N189 Chronic kidney disease, unspecified: Secondary | ICD-10-CM | POA: Insufficient documentation

## 2017-08-14 MED ORDER — SODIUM CHLORIDE 0.9 % IV SOLN
510.0000 mg | INTRAVENOUS | Status: DC
  Administered 2017-08-14: 510 mg via INTRAVENOUS
  Filled 2017-08-14: qty 17

## 2017-08-14 NOTE — Discharge Instructions (Signed)

## 2017-08-16 ENCOUNTER — Other Ambulatory Visit: Payer: Self-pay

## 2017-08-16 DIAGNOSIS — C9001 Multiple myeloma in remission: Secondary | ICD-10-CM

## 2017-08-17 ENCOUNTER — Inpatient Hospital Stay: Payer: Medicare HMO

## 2017-08-17 VITALS — BP 129/72 | HR 81 | Temp 98.3°F | Resp 20

## 2017-08-17 DIAGNOSIS — R5383 Other fatigue: Secondary | ICD-10-CM | POA: Diagnosis not present

## 2017-08-17 DIAGNOSIS — D649 Anemia, unspecified: Secondary | ICD-10-CM | POA: Diagnosis not present

## 2017-08-17 DIAGNOSIS — C9001 Multiple myeloma in remission: Secondary | ICD-10-CM

## 2017-08-17 DIAGNOSIS — C9000 Multiple myeloma not having achieved remission: Secondary | ICD-10-CM

## 2017-08-17 DIAGNOSIS — Z5112 Encounter for antineoplastic immunotherapy: Secondary | ICD-10-CM | POA: Diagnosis not present

## 2017-08-17 LAB — PHOSPHORUS: PHOSPHORUS: 3.5 mg/dL (ref 2.5–4.6)

## 2017-08-17 LAB — CMP (CANCER CENTER ONLY)
ALT: 8 U/L (ref 0–55)
AST: 13 U/L (ref 5–34)
Albumin: 3.8 g/dL (ref 3.5–5.0)
Alkaline Phosphatase: 111 U/L (ref 40–150)
Anion gap: 10 (ref 3–11)
BILIRUBIN TOTAL: 0.5 mg/dL (ref 0.2–1.2)
BUN: 28 mg/dL — ABNORMAL HIGH (ref 7–26)
CO2: 23 mmol/L (ref 22–29)
Calcium: 9.1 mg/dL (ref 8.4–10.4)
Chloride: 100 mmol/L (ref 98–109)
Creatinine: 2.28 mg/dL — ABNORMAL HIGH (ref 0.70–1.30)
GFR, EST NON AFRICAN AMERICAN: 28 mL/min — AB (ref >60)
GFR, Est AFR Am: 32 mL/min — ABNORMAL LOW (ref >60)
Glucose, Bld: 305 mg/dL — ABNORMAL HIGH (ref 70–140)
Potassium: 4.4 mmol/L (ref 3.5–5.1)
Sodium: 133 mmol/L — ABNORMAL LOW (ref 136–145)
TOTAL PROTEIN: 7 g/dL (ref 6.4–8.3)

## 2017-08-17 LAB — CBC WITH DIFFERENTIAL (CANCER CENTER ONLY)
BASOS ABS: 0 10*3/uL (ref 0.0–0.1)
Basophils Relative: 0 %
EOS ABS: 0 10*3/uL (ref 0.0–0.5)
EOS PCT: 0 %
HCT: 28.9 % — ABNORMAL LOW (ref 38.4–49.9)
Hemoglobin: 9.5 g/dL — ABNORMAL LOW (ref 13.0–17.1)
LYMPHS PCT: 6 %
Lymphs Abs: 0.3 10*3/uL — ABNORMAL LOW (ref 0.9–3.3)
MCH: 32 pg (ref 27.2–33.4)
MCHC: 32.9 g/dL (ref 32.0–36.0)
MCV: 97.3 fL (ref 79.3–98.0)
Monocytes Absolute: 0 10*3/uL — ABNORMAL LOW (ref 0.1–0.9)
Monocytes Relative: 1 %
Neutro Abs: 4.4 10*3/uL (ref 1.5–6.5)
Neutrophils Relative %: 93 %
Platelet Count: 188 10*3/uL (ref 140–400)
RBC: 2.97 MIL/uL — AB (ref 4.20–5.82)
RDW: 14.4 % (ref 11.0–14.6)
WBC: 4.7 10*3/uL (ref 4.0–10.3)

## 2017-08-17 LAB — MAGNESIUM: Magnesium: 3 mg/dL — ABNORMAL HIGH (ref 1.5–2.5)

## 2017-08-17 MED ORDER — PROCHLORPERAZINE MALEATE 10 MG PO TABS
10.0000 mg | ORAL_TABLET | Freq: Once | ORAL | Status: AC
  Administered 2017-08-17: 10 mg via ORAL

## 2017-08-17 MED ORDER — PROCHLORPERAZINE MALEATE 10 MG PO TABS
ORAL_TABLET | ORAL | Status: AC
  Filled 2017-08-17: qty 1

## 2017-08-17 MED ORDER — SODIUM CHLORIDE 0.9 % IV SOLN
Freq: Once | INTRAVENOUS | Status: AC
  Administered 2017-08-17: 15:00:00 via INTRAVENOUS

## 2017-08-17 MED ORDER — BORTEZOMIB CHEMO SQ INJECTION 3.5 MG (2.5MG/ML)
1.3000 mg/m2 | Freq: Once | INTRAMUSCULAR | Status: AC
  Administered 2017-08-17: 2.25 mg via SUBCUTANEOUS
  Filled 2017-08-17: qty 2.25

## 2017-08-17 NOTE — Progress Notes (Signed)
Dr.Perlov okay to tx with ctn 2.28. Notified MD of pt symptom of dizziness. Pt verbalized that he has been drinking plenty of fluids at home and has been receiving iron at [facility]. Received verbal order for 1L IVF over an hour from MD. Pt confirmed consent to receive fluids through a PIV today.

## 2017-08-17 NOTE — Patient Instructions (Signed)
[facility] Discharge Instructions for Patients Receiving Chemotherapy  Today you received the following chemotherapy agents bortezomib (Velcade)  To help prevent nausea and vomiting after your treatment, we encourage you to take your nausea medication as directed.   If you develop nausea and vomiting that is not controlled by your nausea medication, call the clinic.   BELOW ARE SYMPTOMS THAT SHOULD BE REPORTED IMMEDIATELY:  *FEVER GREATER THAN 100.5 F  *CHILLS WITH OR WITHOUT FEVER  NAUSEA AND VOMITING THAT IS NOT CONTROLLED WITH YOUR NAUSEA MEDICATION  *UNUSUAL SHORTNESS OF BREATH  *UNUSUAL BRUISING OR BLEEDING  TENDERNESS IN MOUTH AND THROAT WITH OR WITHOUT PRESENCE OF ULCERS  *URINARY PROBLEMS  *BOWEL PROBLEMS  UNUSUAL RASH Items with * indicate a potential emergency and should be followed up as soon as possible.  Feel free to call the clinic should you have any questions or concerns. The clinic phone number is [phone].  Please show the CHEMO ALERT CARD at check-in to the Emergency Department and triage nurse.   

## 2017-08-21 ENCOUNTER — Ambulatory Visit (HOSPITAL_COMMUNITY)
Admission: RE | Admit: 2017-08-21 | Discharge: 2017-08-21 | Disposition: A | Discharge status: Home / Self Care | Payer: Medicare HMO | Source: Ambulatory Visit | Attending: Nephrology | Admitting: Nephrology

## 2017-08-21 DIAGNOSIS — D631 Anemia in chronic kidney disease: Secondary | ICD-10-CM | POA: Diagnosis not present

## 2017-08-21 DIAGNOSIS — N189 Chronic kidney disease, unspecified: Secondary | ICD-10-CM | POA: Insufficient documentation

## 2017-08-21 MED ORDER — SODIUM CHLORIDE 0.9 % IV SOLN
510.0000 mg | INTRAVENOUS | Status: DC
  Administered 2017-08-21: 09:00:00 510 mg via INTRAVENOUS
  Filled 2017-08-21: qty 17

## 2017-08-22 ENCOUNTER — Other Ambulatory Visit: Payer: Self-pay | Admitting: Hematology and Oncology

## 2017-08-22 DIAGNOSIS — C9000 Multiple myeloma not having achieved remission: Secondary | ICD-10-CM

## 2017-08-22 MED FILL — POTASSIUM CL 10 MEQ TAB SA: 10 | 7 days supply | Qty: 7 | Fill #0

## 2017-08-22 MED FILL — DEXAMETHASONE 4 MG TABLET: 4 | 21 days supply | Qty: 15 | Fill #0

## 2017-08-24 ENCOUNTER — Other Ambulatory Visit: Payer: Self-pay

## 2017-08-24 ENCOUNTER — Inpatient Hospital Stay: Payer: Medicare HMO

## 2017-08-24 VITALS — BP 124/67 | HR 87 | Temp 98.6°F | Resp 18

## 2017-08-24 DIAGNOSIS — R5383 Other fatigue: Secondary | ICD-10-CM | POA: Diagnosis not present

## 2017-08-24 DIAGNOSIS — Z5112 Encounter for antineoplastic immunotherapy: Secondary | ICD-10-CM | POA: Diagnosis not present

## 2017-08-24 DIAGNOSIS — D649 Anemia, unspecified: Secondary | ICD-10-CM | POA: Diagnosis not present

## 2017-08-24 DIAGNOSIS — C9001 Multiple myeloma in remission: Secondary | ICD-10-CM

## 2017-08-24 DIAGNOSIS — C9000 Multiple myeloma not having achieved remission: Secondary | ICD-10-CM

## 2017-08-24 LAB — CMP (CANCER CENTER ONLY)
ALT: 10 U/L (ref 0–55)
ANION GAP: 11 (ref 3–11)
AST: 13 U/L (ref 5–34)
Albumin: 3.6 g/dL (ref 3.5–5.0)
Alkaline Phosphatase: 121 U/L (ref 40–150)
BILIRUBIN TOTAL: 0.4 mg/dL (ref 0.2–1.2)
BUN: 32 mg/dL — ABNORMAL HIGH (ref 7–26)
CO2: 22 mmol/L (ref 22–29)
Calcium: 9.9 mg/dL (ref 8.4–10.4)
Chloride: 102 mmol/L (ref 98–109)
Creatinine: 1.96 mg/dL — ABNORMAL HIGH (ref 0.70–1.30)
GFR, EST NON AFRICAN AMERICAN: 33 mL/min — AB (ref >60)
GFR, Est AFR Am: 38 mL/min — ABNORMAL LOW (ref >60)
GLUCOSE: 267 mg/dL — AB (ref 70–140)
POTASSIUM: 3.9 mmol/L (ref 3.5–5.1)
Sodium: 135 mmol/L — ABNORMAL LOW (ref 136–145)
TOTAL PROTEIN: 6.7 g/dL (ref 6.4–8.3)

## 2017-08-24 LAB — CBC WITH DIFFERENTIAL (CANCER CENTER ONLY)
BASOS ABS: 0 10*3/uL (ref 0.0–0.1)
Basophils Relative: 0 %
Eosinophils Absolute: 0.1 10*3/uL (ref 0.0–0.5)
Eosinophils Relative: 1 %
HEMATOCRIT: 28.2 % — AB (ref 38.4–49.9)
HEMOGLOBIN: 9.4 g/dL — AB (ref 13.0–17.1)
Lymphocytes Relative: 4 %
Lymphs Abs: 0.2 10*3/uL — ABNORMAL LOW (ref 0.9–3.3)
MCH: 32.8 pg (ref 27.2–33.4)
MCHC: 33.4 g/dL (ref 32.0–36.0)
MCV: 98.3 fL — AB (ref 79.3–98.0)
Monocytes Absolute: 0 10*3/uL — ABNORMAL LOW (ref 0.1–0.9)
Monocytes Relative: 1 %
NEUTROS ABS: 5 10*3/uL (ref 1.5–6.5)
Neutrophils Relative %: 94 %
Platelet Count: 170 10*3/uL (ref 140–400)
RBC: 2.87 MIL/uL — AB (ref 4.20–5.82)
RDW: 15.7 % — ABNORMAL HIGH (ref 11.0–14.6)
WBC: 5.4 10*3/uL (ref 4.0–10.3)

## 2017-08-24 LAB — MAGNESIUM: Magnesium: 2.3 mg/dL (ref 1.5–2.5)

## 2017-08-24 MED ORDER — ACCU-CHEK AVIVA PLUS VI STRP: 1.0000 | ORAL_STRIP | 12 refills | Status: DC

## 2017-08-24 MED ORDER — PROCHLORPERAZINE MALEATE 10 MG PO TABS
10.0000 mg | ORAL_TABLET | Freq: Once | ORAL | Status: AC
  Administered 2017-08-24: 10 mg via ORAL

## 2017-08-24 MED ORDER — PROCHLORPERAZINE MALEATE 10 MG PO TABS
ORAL_TABLET | ORAL | Status: AC
  Filled 2017-08-24: qty 1

## 2017-08-24 MED ORDER — BORTEZOMIB CHEMO SQ INJECTION 3.5 MG (2.5MG/ML)
1.3000 mg/m2 | Freq: Once | INTRAMUSCULAR | Status: AC
  Administered 2017-08-24: 2.25 mg via SUBCUTANEOUS
  Filled 2017-08-24: qty 2.25

## 2017-08-24 MED FILL — ACCU-CHEK AVIVA PLUS TEST S: 30 days supply | Qty: 100 | Fill #0

## 2017-08-24 NOTE — Patient Instructions (Signed)
[facility] Discharge Instructions for Patients Receiving Chemotherapy  Today you received the following chemotherapy agents bortezomib (Velcade)  To help prevent nausea and vomiting after your treatment, we encourage you to take your nausea medication as directed.   If you develop nausea and vomiting that is not controlled by your nausea medication, call the clinic.   BELOW ARE SYMPTOMS THAT SHOULD BE REPORTED IMMEDIATELY:  *FEVER GREATER THAN 100.5 F  *CHILLS WITH OR WITHOUT FEVER  NAUSEA AND VOMITING THAT IS NOT CONTROLLED WITH YOUR NAUSEA MEDICATION  *UNUSUAL SHORTNESS OF BREATH  *UNUSUAL BRUISING OR BLEEDING  TENDERNESS IN MOUTH AND THROAT WITH OR WITHOUT PRESENCE OF ULCERS  *URINARY PROBLEMS  *BOWEL PROBLEMS  UNUSUAL RASH Items with * indicate a potential emergency and should be followed up as soon as possible.  Feel free to call the clinic should you have any questions or concerns. The clinic phone number is [phone].  Please show the CHEMO ALERT CARD at check-in to the Emergency Department and triage nurse.   

## 2017-08-28 ENCOUNTER — Other Ambulatory Visit: Payer: Self-pay | Admitting: Radiology

## 2017-08-29 ENCOUNTER — Encounter (HOSPITAL_COMMUNITY): Payer: Self-pay

## 2017-08-29 ENCOUNTER — Ambulatory Visit (HOSPITAL_COMMUNITY)
Admission: RE | Admit: 2017-08-29 | Discharge: 2017-08-29 | Disposition: A | Discharge status: Home / Self Care | Payer: Medicare HMO | Source: Ambulatory Visit | Attending: Hematology and Oncology | Admitting: Hematology and Oncology

## 2017-08-29 DIAGNOSIS — E1122 Type 2 diabetes mellitus with diabetic chronic kidney disease: Secondary | ICD-10-CM | POA: Insufficient documentation

## 2017-08-29 DIAGNOSIS — Z8546 Personal history of malignant neoplasm of prostate: Secondary | ICD-10-CM | POA: Diagnosis not present

## 2017-08-29 DIAGNOSIS — D7589 Other specified diseases of blood and blood-forming organs: Secondary | ICD-10-CM | POA: Insufficient documentation

## 2017-08-29 DIAGNOSIS — Z7982 Long term (current) use of aspirin: Secondary | ICD-10-CM | POA: Diagnosis not present

## 2017-08-29 DIAGNOSIS — Z79899 Other long term (current) drug therapy: Secondary | ICD-10-CM | POA: Diagnosis not present

## 2017-08-29 DIAGNOSIS — N183 Chronic kidney disease, stage 3 (moderate): Secondary | ICD-10-CM | POA: Diagnosis not present

## 2017-08-29 DIAGNOSIS — I129 Hypertensive chronic kidney disease with stage 1 through stage 4 chronic kidney disease, or unspecified chronic kidney disease: Secondary | ICD-10-CM | POA: Insufficient documentation

## 2017-08-29 DIAGNOSIS — E1142 Type 2 diabetes mellitus with diabetic polyneuropathy: Secondary | ICD-10-CM | POA: Diagnosis not present

## 2017-08-29 DIAGNOSIS — Z8579 Personal history of other malignant neoplasms of lymphoid, hematopoietic and related tissues: Secondary | ICD-10-CM | POA: Diagnosis not present

## 2017-08-29 DIAGNOSIS — Z7952 Long term (current) use of systemic steroids: Secondary | ICD-10-CM | POA: Insufficient documentation

## 2017-08-29 DIAGNOSIS — E1136 Type 2 diabetes mellitus with diabetic cataract: Secondary | ICD-10-CM | POA: Diagnosis not present

## 2017-08-29 DIAGNOSIS — C9001 Multiple myeloma in remission: Secondary | ICD-10-CM

## 2017-08-29 DIAGNOSIS — D649 Anemia, unspecified: Secondary | ICD-10-CM | POA: Diagnosis not present

## 2017-08-29 DIAGNOSIS — Z794 Long term (current) use of insulin: Secondary | ICD-10-CM | POA: Insufficient documentation

## 2017-08-29 DIAGNOSIS — D631 Anemia in chronic kidney disease: Secondary | ICD-10-CM | POA: Insufficient documentation

## 2017-08-29 DIAGNOSIS — C9000 Multiple myeloma not having achieved remission: Secondary | ICD-10-CM | POA: Diagnosis not present

## 2017-08-29 LAB — CBC WITH DIFFERENTIAL/PLATELET
BASOS PCT: 1 %
Basophils Absolute: 0 10*3/uL (ref 0.0–0.1)
EOS ABS: 0.6 10*3/uL (ref 0.0–0.7)
EOS PCT: 13 %
HCT: 29.2 % — ABNORMAL LOW (ref 39.0–52.0)
Hemoglobin: 9.8 g/dL — ABNORMAL LOW (ref 13.0–17.0)
LYMPHS ABS: 0.8 10*3/uL (ref 0.7–4.0)
Lymphocytes Relative: 17 %
MCH: 33.4 pg (ref 26.0–34.0)
MCHC: 33.6 g/dL (ref 30.0–36.0)
MCV: 99.7 fL (ref 78.0–100.0)
MONOS PCT: 5 %
Monocytes Absolute: 0.2 10*3/uL (ref 0.1–1.0)
Neutro Abs: 2.9 10*3/uL (ref 1.7–7.7)
Neutrophils Relative %: 64 %
PLATELETS: 187 10*3/uL (ref 150–400)
RBC: 2.93 MIL/uL — AB (ref 4.22–5.81)
RDW: 14.8 % (ref 11.5–15.5)
WBC: 4.5 10*3/uL (ref 4.0–10.5)

## 2017-08-29 LAB — PROTIME-INR
INR: 0.97
PROTHROMBIN TIME: 12.8 s (ref 11.4–15.2)

## 2017-08-29 LAB — GLUCOSE, CAPILLARY: Glucose-Capillary: 114 mg/dL — ABNORMAL HIGH (ref 65–99)

## 2017-08-29 MED ORDER — MIDAZOLAM HCL 2 MG/2ML IJ SOLN
INTRAMUSCULAR | Status: AC | PRN
  Administered 2017-08-29 (×2): 1 mg via INTRAVENOUS

## 2017-08-29 MED ORDER — LIDOCAINE HCL (PF) 1 % IJ SOLN
INTRAMUSCULAR | Status: AC | PRN
  Administered 2017-08-29: 30 mL

## 2017-08-29 MED ORDER — FENTANYL CITRATE (PF) 100 MCG/2ML IJ SOLN
INTRAMUSCULAR | Status: AC
  Filled 2017-08-29: qty 2

## 2017-08-29 MED ORDER — SODIUM CHLORIDE 0.9 % IV SOLN
INTRAVENOUS | Status: DC
  Administered 2017-08-29: 09:00:00 via INTRAVENOUS

## 2017-08-29 MED ORDER — FENTANYL CITRATE (PF) 100 MCG/2ML IJ SOLN
INTRAMUSCULAR | Status: AC | PRN
  Administered 2017-08-29 (×2): 50 ug via INTRAVENOUS

## 2017-08-29 MED ORDER — MIDAZOLAM HCL 2 MG/2ML IJ SOLN
INTRAMUSCULAR | Status: AC
  Filled 2017-08-29: qty 2

## 2017-08-29 NOTE — Procedures (Signed)
BM Bx R iliac EBL 0 Comp 0 

## 2017-08-29 NOTE — H&P (Signed)
Referring Physician(s): Perlov,Mikhail G  Supervising Physician: Marybelle Killings  Patient Status:  [facility] OP  Chief Complaint:  "I'm having another bone marrow biopsy"  Subjective: Patient familiar to IR service from prior bone marrow biopsies on  03/13/17 and 06/08/17.  He has a history of multiple myeloma and presents again today for CT-guided bone marrow biopsy for disease assessment after completion of induction therapy.  He currently denies fever, headache, chest pain, dyspnea, cough, abdominal/back pain, nausea, vomiting or bleeding. Past Medical History:  Diagnosis Date  . Anemia   . Arthritis    shoulders,feet   . Cataract    per eye dr -has appt 5-11  . CKD (chronic kidney disease), stage III ([place])   . Elevated PSA   . History of ketoacidosis    03-29-2015   . History of sepsis    07-25-2013  non-compliant w/ medication  . Hyperlipidemia   . Hypertension   . Nocturia   . Peripheral neuropathy   . Prostate cancer ([place])   . Type 2 diabetes mellitus with insulin therapy [facility])    Past Surgical History:  Procedure Laterality Date  . CIRCUMCISION  1990's  . PROSTATE BIOPSY N/A 12/21/2015   Procedure: BIOPSY TRANSRECTAL ULTRASONIC PROSTATE (TUBP);  Surgeon: Festus Aloe, MD;  Location: [facility];  Service: Urology;  Laterality: N/A;     Allergies: Patient has no known allergies.  Medications: Prior to Admission medications   Medication Sig Start Date End Date Taking? Authorizing Provider  ACCU-CHEK AVIVA PLUS test strip 1 each by Other route See admin instructions. 08/24/17  Yes [name], MD  acyclovir (ZOVIRAX) 400 MG tablet Take 1 tablet (400 mg total) by mouth 2 (two) times daily. 03/08/17  Yes Perlov, Marinell Blight, MD  amLODipine (NORVASC) 5 MG tablet Take 1 tablet (5 mg total) by mouth daily. 11/21/16  Yes Lottie Mussel T, MD  aspirin EC 81 MG tablet Take 1 tablet (81 mg total) by mouth daily. 10/02/16  Yes Langeland, Dawn T, MD    cyanocobalamin (V-R VITAMIN B-12) 500 MCG tablet Take 1 tablet (500 mcg total) by mouth 2 (two) times daily. 06/06/17  Yes [name], MD  dexamethasone (DECADRON) 4 MG tablet Take 2.5 tablets (10 mg total) by mouth 2 (two) times a week. 07/16/17 09/10/17 Yes Perlov, Marinell Blight, MD  dexamethasone (DECADRON) 4 MG tablet TAKE 5 TABLETS BY MOUTH ONCE A WEEK. 08/22/17  Yes Perlov, Marinell Blight, MD  ferrous sulfate 325 (65 FE) MG tablet Take 325 mg by mouth daily with breakfast.   Yes [provider]  folic acid (FOLVITE) 1 MG tablet Take 1 tablet (1 mg total) by mouth daily. 11/12/16  Yes Lorella Nimrod, MD  gabapentin (NEURONTIN) 300 MG capsule TAKE 1 CAPSULE BY MOUTH IN THE MORNING AND AT LUNCH, TAKE 2 CAPSULES BY MOUTH AT NIGHT. 08/06/17  Yes [name], MD  insulin aspart protamine - aspart (NOVOLOG 70/30 MIX) (70-30) 100 UNIT/ML FlexPen Inject 0.07 mLs (7 Units total) into the skin 2 (two) times daily with a meal. 07/24/17  Yes [name], MD  lidocaine-prilocaine (EMLA) cream Apply to affected area once 03/08/17  Yes Perlov, Marinell Blight, MD  mometasone (ELOCON) 0.1 % cream Apply 1 application topically daily. 03/30/17  Yes [name], MD  Multiple Vitamin (MULTIVITAMIN WITH MINERALS) TABS tablet Take 1 tablet by mouth daily. 11/12/16  Yes Lorella Nimrod, MD  potassium chloride (K-DUR) 10 MEQ tablet TAKE 1 TABLET  BY MOUTH DAILY. 08/22/17  Yes [name], MD  pravastatin (PRAVACHOL) 20 MG tablet Take 1 tablet (20 mg total) by mouth daily. 11/21/16  Yes Langeland, Dawn T, MD  senna-docusate (SENOKOT-S) 8.6-50 MG tablet Take 2 tablets by mouth 2 (two) times daily. 11/11/16  Yes Lorella Nimrod, MD  sulfamethoxazole-trimethoprim (BACTRIM) 400-80 MG tablet Take 1 tablet by mouth daily. 03/30/17  Yes [name], MD  thiamine 100 MG tablet Take 1 tablet (100 mg total) by mouth daily. 11/12/16  Yes Lorella Nimrod, MD  Ascorbic Acid (VITAMIN C) 1000 MG tablet Take 1,000 mg by mouth  daily.    [provider]  Cholecalciferol (VITAMIN D) 2000 units CAPS Take 2,000 Units by mouth daily.    [provider]  LORazepam (ATIVAN) 0.5 MG tablet Take 1 tablet (0.5 mg total) by mouth every 6 (six) hours as needed (Nausea or vomiting). 03/08/17   [name], MD  nicotine (NICODERM CQ - DOSED IN MG/24 HOURS) 14 mg/24hr patch Place 1 patch (14 mg total) onto the skin daily. 06/04/17   [name], MD  nicotine (NICODERM CQ - DOSED IN MG/24 HR) 7 mg/24hr patch Place 1 patch (7 mg total) onto the skin daily. 06/04/17   [name], MD  NONFORMULARY OR COMPOUNDED [place]  Combination Pain Cream -  Baclofen 2%, Doxepin 5%, Gabapentin 6%, Topiramate 2%, Pentoxifylline 3% Apply 1-2 grams to affected area 3-4 times daily Qty. 120 gm 3 refills 08/13/17   Edrick Kins, DPM  ondansetron (ZOFRAN) 8 MG tablet Take 1 tablet (8 mg total) by mouth 2 (two) times daily as needed (Nausea or vomiting). 03/08/17   [name], MD  potassium chloride (K-DUR) 10 MEQ tablet Take 2 tablets (20 mEq total) by mouth daily. 07/13/17 08/12/17  [name], MD  prochlorperazine (COMPAZINE) 10 MG tablet Take 1 tablet (10 mg total) by mouth every 6 (six) hours as needed (Nausea or vomiting). 03/08/17   [name], MD     Vital Signs: BP (!) 147/82 (BP Location: Left Arm)   Pulse 82   Temp 97.9 F (36.6 C) (Oral)   Resp 18   SpO2 98%   Physical Exam awake, alert.  Chest clear to auscultation bilaterally.  Heart with regular rate and rhythm.  Abdomen soft, positive bowel sounds, nontender.  No lower extremity edema.  Imaging: No results found.  Labs:  CBC: Recent Labs    06/29/17 1244 07/13/17 0938  08/10/17 1220 08/17/17 1339 08/24/17 1147 08/29/17 0911  WBC 5.3 4.2   < > 6.5 4.7 5.4 4.5  HGB 9.6* 10.1*  --  9.4*  --   --  9.8*  HCT 28.8* 30.6*   < > 27.9* 28.9* 28.2* 29.2*  PLT 247 198   < > 228 188 170 187   < > = values in this  interval not displayed.    COAGS: Recent Labs    03/13/17 0922 06/08/17 0710  INR 0.98 0.97  APTT 34 35    BMP: Recent Labs    08/03/17 1403 08/10/17 1220 08/17/17 1339 08/24/17 1147  NA 134* 132* 133* 135*  K 4.6 4.1 4.4 3.9  CL 104 103 100 102  CO2 18* 19* 23 22  GLUCOSE 155* 224* 305* 267*  BUN 30* 43* 28* 32*  CALCIUM 9.4 9.5 9.1 9.9  CREATININE 2.17* 2.27* 2.28* 1.96*  GFRNONAA 29* 28* 28* 33*  GFRAA 34* 32* 32* 38*  LIVER FUNCTION TESTS: Recent Labs    08/03/17 1403 08/10/17 1220 08/17/17 1339 08/24/17 1147  BILITOT 0.4 0.4 0.5 0.4  AST '16 12 13 13  ' ALT '13 10 8 10  ' ALKPHOS 106 172* 111 121  PROT 7.3 7.0 7.0 6.7  ALBUMIN 3.8 3.9 3.8 3.6    Assessment and Plan:  Pt with history of multiple myeloma ; presents today for CT-guided bone marrow biopsy for disease assessment after completion of induction therapy.Risks and benefits discussed with the patient including, but not limited to bleeding, infection, damage to adjacent structures or low yield requiring additional tests.  All of the patient's questions were answered, patient is agreeable to proceed. Consent signed and in chart.    Electronically Signed: D. Rowe Robert, PA-C 08/29/2017, 9:42 AM   I spent a total of 20 minutes at the the patient's bedside AND on the patient's hospital floor or unit, greater than 50% of which was counseling/coordinating care for CT-guided bone marrow biopsy

## 2017-08-29 NOTE — Discharge Instructions (Signed)
You may remove dressing and bathe in 24 hours.  ° ° °Bone Marrow Aspiration and Bone Marrow Biopsy, Adult, Care After °This sheet gives you information about how to care for yourself after your procedure. Your health care provider may also give you more specific instructions. If you have problems or questions, contact your health care provider. °What can I expect after the procedure? °After the procedure, it is common to have: °· Mild pain and tenderness. °· Swelling. °· Bruising. ° °Follow these instructions at home: °· Take over-the-counter or prescription medicines only as told by your health care provider. °· Do not take baths, swim, or use a hot tub until your health care provider approves. Ask if you can take a shower or have a sponge bath. °· Follow instructions from your health care provider about how to take care of the puncture site. Make sure you: °? Wash your hands with soap and water before you change your bandage (dressing). If soap and water are not available, use hand sanitizer. °? Change your dressing as told by your health care provider. °· Check your puncture site every day for signs of infection. Check for: °? More redness, swelling, or pain. °? More fluid or blood. °? Warmth. °? Pus or a bad smell. °· Return to your normal activities as told by your health care provider. Ask your health care provider what activities are safe for you. °· Do not drive for 24 hours if you were given a medicine to help you relax (sedative). °· Keep all follow-up visits as told by your health care provider. This is important. °Contact a health care provider if: °· You have more redness, swelling, or pain around the puncture site. °· You have more fluid or blood coming from the puncture site. °· Your puncture site feels warm to the touch. °· You have pus or a bad smell coming from the puncture site. °· You have a fever. °· Your pain is not controlled with medicine. °This information is not intended to replace advice  given to you by your health care provider. Make sure you discuss any questions you have with your health care provider. °Document Released: 01/13/2005 Document Revised: 01/14/2016 Document Reviewed: 12/08/2015 °Elsevier Interactive Patient Education © 2018 [employer] ° ° ° ° °Moderate Conscious Sedation, Adult, Care After °These instructions provide you with information about caring for yourself after your procedure. Your health care provider may also give you more specific instructions. Your treatment has been planned according to current medical practices, but problems sometimes occur. Call your health care provider if you have any problems or questions after your procedure. °What can I expect after the procedure? °After your procedure, it is common: °· To feel sleepy for several hours. °· To feel clumsy and have poor balance for several hours. °· To have poor judgment for several hours. °· To vomit if you eat too soon. ° °Follow these instructions at home: °For at least 24 hours after the procedure: ° °· Do not: °? Participate in activities where you could fall or become injured. °? Drive. °? Use heavy machinery. °? Drink alcohol. °? Take sleeping pills or medicines that cause drowsiness. °? Make important decisions or sign legal documents. °? Take care of children on your own. °· Rest. °Eating and drinking °· Follow the diet recommended by your health care provider. °· If you vomit: °? Drink water, juice, or soup when you can drink without vomiting. °? Make sure you have little or no nausea before   foods. General instructions  Have a responsible adult stay with you until you are awake and alert.  Take over-the-counter and prescription medicines only as told by your health care provider.  If you smoke, do not smoke without supervision.  Keep all follow-up visits as told by your health care provider. This is important. Contact a health care provider if:  You keep feeling nauseous or you  keep vomiting.  You feel light-headed.  You develop a rash.  You have a fever. Get help right away if:  You have trouble breathing. This information is not intended to replace advice given to you by your health care provider. Make sure you discuss any questions you have with your health care provider. Document Released: 04/16/2013 Document Revised: 11/29/2015 Document Reviewed: 10/16/2015 Elsevier Interactive Patient Education  [employer].

## 2017-08-31 ENCOUNTER — Inpatient Hospital Stay: Payer: Medicare HMO

## 2017-08-31 ENCOUNTER — Other Ambulatory Visit: Payer: Self-pay | Admitting: *Deleted

## 2017-08-31 VITALS — BP 130/75 | HR 72 | Temp 98.2°F | Resp 18

## 2017-08-31 DIAGNOSIS — C9001 Multiple myeloma in remission: Secondary | ICD-10-CM | POA: Diagnosis not present

## 2017-08-31 DIAGNOSIS — C9000 Multiple myeloma not having achieved remission: Secondary | ICD-10-CM

## 2017-08-31 DIAGNOSIS — D649 Anemia, unspecified: Secondary | ICD-10-CM | POA: Diagnosis not present

## 2017-08-31 DIAGNOSIS — Z5112 Encounter for antineoplastic immunotherapy: Secondary | ICD-10-CM | POA: Diagnosis not present

## 2017-08-31 DIAGNOSIS — R5383 Other fatigue: Secondary | ICD-10-CM | POA: Diagnosis not present

## 2017-08-31 LAB — LACTATE DEHYDROGENASE: LDH: 159 U/L (ref 125–245)

## 2017-08-31 MED ORDER — PROCHLORPERAZINE MALEATE 10 MG PO TABS
ORAL_TABLET | ORAL | Status: AC
Start: 2017-08-31 — End: 2017-08-31
  Filled 2017-08-31: qty 1

## 2017-08-31 MED ORDER — PROCHLORPERAZINE MALEATE 10 MG PO TABS
10.0000 mg | ORAL_TABLET | Freq: Once | ORAL | Status: AC
  Administered 2017-08-31: 10 mg via ORAL

## 2017-08-31 MED ORDER — BORTEZOMIB CHEMO SQ INJECTION 3.5 MG (2.5MG/ML)
1.3000 mg/m2 | Freq: Once | INTRAMUSCULAR | Status: AC
  Administered 2017-08-31: 2.25 mg via SUBCUTANEOUS
  Filled 2017-08-31: qty 2.25

## 2017-08-31 NOTE — Patient Instructions (Signed)
[facility] Discharge Instructions for Patients Receiving Chemotherapy  Today you received the following chemotherapy agents Velcade.  To help prevent nausea and vomiting after your treatment, we encourage you to take your nausea medication as directed.  If you develop nausea and vomiting that is not controlled by your nausea medication, call the clinic.   BELOW ARE SYMPTOMS THAT SHOULD BE REPORTED IMMEDIATELY:  *FEVER GREATER THAN 100.5 F  *CHILLS WITH OR WITHOUT FEVER  NAUSEA AND VOMITING THAT IS NOT CONTROLLED WITH YOUR NAUSEA MEDICATION  *UNUSUAL SHORTNESS OF BREATH  *UNUSUAL BRUISING OR BLEEDING  TENDERNESS IN MOUTH AND THROAT WITH OR WITHOUT PRESENCE OF ULCERS  *URINARY PROBLEMS  *BOWEL PROBLEMS  UNUSUAL RASH Items with * indicate a potential emergency and should be followed up as soon as possible.  Feel free to call the clinic should you have any questions or concerns. The clinic phone number is [phone].  Please show the CHEMO ALERT CARD at check-in to the Emergency Department and triage nurse.   

## 2017-08-31 NOTE — Progress Notes (Signed)
Okay to treat from the last CMP with creatinine of 1.96 per Val RN per Dr. Jana Hakim.

## 2017-09-01 LAB — BETA 2 MICROGLOBULIN, SERUM: Beta-2 Microglobulin: 2.9 mg/L — ABNORMAL HIGH (ref 0.6–2.4)

## 2017-09-03 LAB — KAPPA/LAMBDA LIGHT CHAINS
KAPPA FREE LGHT CHN: 38 mg/L — AB (ref 3.3–19.4)
KAPPA, LAMDA LIGHT CHAIN RATIO: 2.33 — AB (ref 0.26–1.65)
LAMDA FREE LIGHT CHAINS: 16.3 mg/L (ref 5.7–26.3)

## 2017-09-04 ENCOUNTER — Ambulatory Visit: Payer: Medicare HMO | Attending: Internal Medicine | Admitting: Internal Medicine

## 2017-09-04 ENCOUNTER — Encounter: Payer: Self-pay | Admitting: Internal Medicine

## 2017-09-04 VITALS — BP 123/69 | HR 76 | Temp 98.1°F | Resp 16 | Wt 155.2 lb

## 2017-09-04 DIAGNOSIS — N183 Chronic kidney disease, stage 3 unspecified: Secondary | ICD-10-CM

## 2017-09-04 DIAGNOSIS — E876 Hypokalemia: Secondary | ICD-10-CM | POA: Diagnosis not present

## 2017-09-04 DIAGNOSIS — E538 Deficiency of other specified B group vitamins: Secondary | ICD-10-CM | POA: Insufficient documentation

## 2017-09-04 DIAGNOSIS — F17210 Nicotine dependence, cigarettes, uncomplicated: Secondary | ICD-10-CM | POA: Diagnosis not present

## 2017-09-04 DIAGNOSIS — E785 Hyperlipidemia, unspecified: Secondary | ICD-10-CM | POA: Diagnosis not present

## 2017-09-04 DIAGNOSIS — E118 Type 2 diabetes mellitus with unspecified complications: Secondary | ICD-10-CM

## 2017-09-04 DIAGNOSIS — M545 Low back pain: Secondary | ICD-10-CM

## 2017-09-04 DIAGNOSIS — C9001 Multiple myeloma in remission: Secondary | ICD-10-CM | POA: Diagnosis not present

## 2017-09-04 DIAGNOSIS — C61 Malignant neoplasm of prostate: Secondary | ICD-10-CM | POA: Insufficient documentation

## 2017-09-04 DIAGNOSIS — I129 Hypertensive chronic kidney disease with stage 1 through stage 4 chronic kidney disease, or unspecified chronic kidney disease: Secondary | ICD-10-CM | POA: Diagnosis not present

## 2017-09-04 DIAGNOSIS — Z8371 Family history of colonic polyps: Secondary | ICD-10-CM | POA: Diagnosis not present

## 2017-09-04 DIAGNOSIS — D638 Anemia in other chronic diseases classified elsewhere: Secondary | ICD-10-CM

## 2017-09-04 DIAGNOSIS — E1136 Type 2 diabetes mellitus with diabetic cataract: Secondary | ICD-10-CM | POA: Insufficient documentation

## 2017-09-04 DIAGNOSIS — Z79899 Other long term (current) drug therapy: Secondary | ICD-10-CM | POA: Insufficient documentation

## 2017-09-04 DIAGNOSIS — G8929 Other chronic pain: Secondary | ICD-10-CM

## 2017-09-04 DIAGNOSIS — E1165 Type 2 diabetes mellitus with hyperglycemia: Secondary | ICD-10-CM

## 2017-09-04 DIAGNOSIS — IMO0002 Reserved for concepts with insufficient information to code with codable children: Secondary | ICD-10-CM

## 2017-09-04 DIAGNOSIS — Z794 Long term (current) use of insulin: Secondary | ICD-10-CM | POA: Insufficient documentation

## 2017-09-04 DIAGNOSIS — F1010 Alcohol abuse, uncomplicated: Secondary | ICD-10-CM | POA: Diagnosis not present

## 2017-09-04 DIAGNOSIS — F17200 Nicotine dependence, unspecified, uncomplicated: Secondary | ICD-10-CM

## 2017-09-04 DIAGNOSIS — E1122 Type 2 diabetes mellitus with diabetic chronic kidney disease: Secondary | ICD-10-CM | POA: Diagnosis not present

## 2017-09-04 LAB — MULTIPLE MYELOMA PANEL, SERUM
ALBUMIN/GLOB SERPL: 1.6 (ref 0.7–1.7)
Albumin SerPl Elph-Mcnc: 4.2 g/dL (ref 2.9–4.4)
Alpha 1: 0.2 g/dL (ref 0.0–0.4)
Alpha2 Glob SerPl Elph-Mcnc: 0.8 g/dL (ref 0.4–1.0)
B-Globulin SerPl Elph-Mcnc: 1 g/dL (ref 0.7–1.3)
GAMMA GLOB SERPL ELPH-MCNC: 0.7 g/dL (ref 0.4–1.8)
GLOBULIN, TOTAL: 2.7 g/dL (ref 2.2–3.9)
IGA: 124 mg/dL (ref 61–437)
IGM (IMMUNOGLOBULIN M), SRM: 48 mg/dL (ref 20–172)
IgG (Immunoglobin G), Serum: 772 mg/dL (ref 700–1600)
Total Protein ELP: 6.9 g/dL (ref 6.0–8.5)

## 2017-09-04 LAB — POCT GLYCOSYLATED HEMOGLOBIN (HGB A1C): HEMOGLOBIN A1C: 7.8

## 2017-09-04 LAB — GLUCOSE, POCT (MANUAL RESULT ENTRY): POC GLUCOSE: 129 mg/dL — AB (ref 70–99)

## 2017-09-04 MED ORDER — INSULIN ASPART PROT & ASPART (70-30 MIX) 100 UNIT/ML PEN: 9.0000 [IU] | PEN_INJECTOR | Freq: Two times a day (BID) | SUBCUTANEOUS | 6 refills | Status: DC

## 2017-09-04 MED FILL — NOVOLOG MIX 70-30 FLEXPEN S: (70-30) 100 | 32 days supply | Qty: 6 | Fill #0

## 2017-09-04 NOTE — Progress Notes (Signed)
Discuss muscle weakness in hip and back  Dizzy and constipation Takes treatment for bone marrow cancer

## 2017-09-04 NOTE — Progress Notes (Signed)
Patient ID: Robert Sparks, male    DOB: 12-08-1945  MRN: [id number]  CC: Follow-up (diabetes)   Subjective: Robert Sparks is a 72 y.o. male who presents for chronic ds management His concerns today include:  Patient with history of HTN, diabetes type 2,HL,CKD stage3, tobacco dependence, EtOH abuse, prostate CA and multiple myeloma .   1.  C/o of "feeling tired in back and hips when I walk." No pain or cramps in legs.  No hip pain.   2.  CKD stage 3:  Reportedly saw nephrologist 2 wks ago and was given iron infusion.  Feels energy no better. -no SOB, CP, LE edema.    3.  tob dep:  Tried patches for about 1 mth.  Caused bad dreams so he stopped using it but did help to dec craving.  He has cut down to 1/3 pk a day.  "I just have to make my mind up and quit."  4.  DM:  Checking BS daily 1-3 x.  Has meter with him.  Range 140-250, sometimes in 300. "I know I eat the wrong things."  His roommate does the cooking.  Reportedly saw nutritionist in past but would like some written guidelines on eating habits to help him -has appt with Dr. Schuyler Amor 11/20/2017  5.  Vit B12 def: He did receive my letter about the vitamin B12 deficiency Taking 1000 mcg daily OTC   Patient Active Problem List   Diagnosis Date Noted  . Pre-ulcerative corn or callous 04/12/2017  . Cataract of both eyes 04/12/2017  . Tobacco dependence 04/12/2017  . Hypokalemia 03/23/2017  . Multiple myeloma in remission ([place]) 02/23/2017  . Oral leukoplakia 02/09/2017  . Lightheadedness 11/10/2016  . Tobacco abuse 06/21/2016  . Hyperlipidemia 06/21/2016  . CKD (chronic kidney disease) stage 3, GFR 30-59 ml/min (HCC) 02/23/2016  . Malignant neoplasm of prostate ([place]) 02/21/2016  . Controlled type 2 diabetes mellitus with stage 3 chronic kidney disease, with long-term current use of insulin ([place]) 03/29/2015  . HTN (hypertension) 07/28/2013  . Anemia, chronic disease 07/26/2013  . ETOH abuse 07/25/2013     Current  Outpatient Medications on File Prior to Visit  Medication Sig Dispense Refill  . acyclovir (ZOVIRAX) 400 MG tablet Take 1 tablet (400 mg total) by mouth 2 (two) times daily. 60 tablet 3  . amLODipine (NORVASC) 5 MG tablet Take 1 tablet (5 mg total) by mouth daily. 90 tablet 3  . aspirin EC 81 MG tablet Take 1 tablet (81 mg total) by mouth daily. 90 tablet 3  . dexamethasone (DECADRON) 4 MG tablet Take 2.5 tablets (10 mg total) by mouth 2 (two) times a week. 40 tablet 0  . dexamethasone (DECADRON) 4 MG tablet TAKE 5 TABLETS BY MOUTH ONCE A WEEK. 15 tablet 0  . gabapentin (NEURONTIN) 300 MG capsule TAKE 1 CAPSULE BY MOUTH IN THE MORNING AND AT LUNCH, TAKE 2 CAPSULES BY MOUTH AT NIGHT. 120 capsule 0  . lidocaine-prilocaine (EMLA) cream Apply to affected area once 30 g 3  . mometasone (ELOCON) 0.1 % cream Apply 1 application topically daily. 45 g 3  . Multiple Vitamin (MULTIVITAMIN WITH MINERALS) TABS tablet Take 1 tablet by mouth daily. 60 tablet 2  . pravastatin (PRAVACHOL) 20 MG tablet Take 1 tablet (20 mg total) by mouth daily. 90 tablet 3  . prochlorperazine (COMPAZINE) 10 MG tablet Take 1 tablet (10 mg total) by mouth every 6 (six) hours as needed (Nausea or vomiting). 30 tablet 1  .  ACCU-CHEK AVIVA PLUS test strip 1 each by Other route See admin instructions. 100 each 12  . Ascorbic Acid (VITAMIN C) 1000 MG tablet Take 1,000 mg by mouth daily.    . Cholecalciferol (VITAMIN D) 2000 units CAPS Take 2,000 Units by mouth daily.    . cyanocobalamin (V-R VITAMIN B-12) 500 MCG tablet Take 1 tablet (500 mcg total) by mouth 2 (two) times daily. 120 tablet 6  . ferrous sulfate 325 (65 FE) MG tablet Take 325 mg by mouth daily with breakfast.    . folic acid (FOLVITE) 1 MG tablet Take 1 tablet (1 mg total) by mouth daily. 60 tablet 0  . nicotine (NICODERM CQ - DOSED IN MG/24 HOURS) 14 mg/24hr patch Place 1 patch (14 mg total) onto the skin daily. 30 patch 0  . NONFORMULARY OR COMPOUNDED [place]  Combination Pain Cream -  Baclofen 2%, Doxepin 5%, Gabapentin 6%, Topiramate 2%, Pentoxifylline 3% Apply 1-2 grams to affected area 3-4 times daily Qty. 120 gm 3 refills 1 each 2  . ondansetron (ZOFRAN) 8 MG tablet Take 1 tablet (8 mg total) by mouth 2 (two) times daily as needed (Nausea or vomiting). 30 tablet 1  . potassium chloride (K-DUR) 10 MEQ tablet Take 2 tablets (20 mEq total) by mouth daily. 60 tablet 0  . potassium chloride (K-DUR) 10 MEQ tablet TAKE 1 TABLET BY MOUTH DAILY. 7 tablet 0  . senna-docusate (SENOKOT-S) 8.6-50 MG tablet Take 2 tablets by mouth 2 (two) times daily. 60 tablet 2   Current Facility-Administered Medications on File Prior to Visit  Medication Dose Route Frequency Provider Last Rate Last Dose  . 0.9 %  sodium chloride infusion  500 mL Intravenous Continuous Pyrtle, Lajuan Lines, MD        No Known Allergies  Social History   Socioeconomic History  . Marital status: Single    Spouse name: Not on file  . Number of children: Not on file  . Years of education: Not on file  . Highest education level: Not on file  Social Needs  . Financial resource strain: Not on file  . Food insecurity - worry: Not on file  . Food insecurity - inability: Not on file  . Transportation needs - medical: Not on file  . Transportation needs - non-medical: Not on file  Occupational History  . Not on file  Tobacco Use  . Smoking status: Current Every Day Smoker    Packs/day: 0.25    Years: 50.00    Pack years: 12.50    Types: Cigarettes  . Smokeless tobacco: Never Used  . Tobacco comment: 1 ppwk-4-5 a day   Substance and Sexual Activity  . Alcohol use: Yes    Alcohol/week: 0.6 oz    Types: 1 Cans of beer per week    Comment: 1 every day-quit couple weeks ago  . Drug use: No  . Sexual activity: Not Currently  Other Topics Concern  . Not on file  Social History Narrative  . Not on file    Family History  Problem Relation Age of Onset  . Cancer Neg Hx   .  Colon cancer Neg Hx   . Colon polyps Neg Hx   . Esophageal cancer Neg Hx   . Rectal cancer Neg Hx   . Stomach cancer Neg Hx     Past Surgical History:  Procedure Laterality Date  . CIRCUMCISION  1990's  . PROSTATE BIOPSY N/A 12/21/2015   Procedure: BIOPSY TRANSRECTAL  ULTRASONIC PROSTATE (TUBP);  Surgeon: Festus Aloe, MD;  [facility];  Service: Urology;  Laterality: N/A;    ROS: Review of Systems Negative except as stated above PHYSICAL EXAM: BP 123/69 (BP Location: Left Arm, Patient Position: Sitting, Cuff Size: Normal)   Pulse 76   Temp 98.1 F (36.7 C) (Oral)   Resp 16   Wt 155 lb 3.2 oz (70.4 kg)   SpO2 100%   BMI 26.64 kg/m   Wt Readings from Last 3 Encounters:  09/04/17 155 lb 3.2 oz (70.4 kg)  08/21/17 156 lb (70.8 kg)  08/14/17 156 lb (70.8 kg)    Physical Exam  General appearance - alert, well appearing, and in no distress Mental status - alert, oriented to person, place, and time Neck - supple, no significant adenopathy Chest - clear to auscultation, no wheezes, rales or rhonchi, symmetric air entry Heart - normal rate, regular rhythm, normal S1, S2, no murmurs, rubs, clicks or gallops Extremities - peripheral pulses normal, no pedal edema, no clubbing or cyanosis MSK: No tenderness on palpation of the lumbar spine.  Gait is normal Results for orders placed or performed in visit on 09/04/17  POCT glucose (manual entry)  Result Value Ref Range   POC Glucose 129 (A) 70 - 99 mg/dl  POCT glycosylated hemoglobin (Hb A1C)  Result Value Ref Range   Hemoglobin A1C 7.8    Lab Results  Component Value Date   WBC 4.5 08/29/2017   HGB 9.8 (L) 08/29/2017   HCT 29.2 (L) 08/29/2017   MCV 99.7 08/29/2017   PLT 187 08/29/2017     Chemistry      Component Value Date/Time   NA 135 (L) 08/24/2017 1147   NA 137 07/13/2017 0938   K 3.9 08/24/2017 1147   K 3.3 (L) 07/13/2017 0938   CL 102 08/24/2017 1147   CO2 22 08/24/2017 1147   CO2  25 07/13/2017 0938   BUN 32 (H) 08/24/2017 1147   BUN 23.8 07/13/2017 0938   CREATININE 1.96 (H) 08/24/2017 1147   CREATININE 1.7 (H) 07/13/2017 0938      Component Value Date/Time   CALCIUM 9.9 08/24/2017 1147   CALCIUM 9.3 07/13/2017 0938   ALKPHOS 121 08/24/2017 1147   ALKPHOS 119 07/13/2017 0938   AST 13 08/24/2017 1147   AST 14 07/13/2017 0938   ALT 10 08/24/2017 1147   ALT 13 07/13/2017 0938   BILITOT 0.4 08/24/2017 1147   BILITOT 0.45 07/13/2017 0938       ASSESSMENT AND PLAN: 1. Diabetes mellitus type 2, uncontrolled, with complications (HCC) Increase NovoLog 7030-9 units twice a day.  Discussed healthy eating habits with him and patient given some printed information.  Continue to monitor blood sugars - POCT glucose (manual entry) - POCT glycosylated hemoglobin (Hb A1C) - insulin aspart protamine - aspart (NOVOLOG 70/30 MIX) (70-30) 100 UNIT/ML FlexPen; Inject 0.09 mLs (9 Units total) into the skin 2 (two) times daily with a meal.  Dispense: 15 mL; Refill: 6  2. CKD (chronic kidney disease) stage 3, GFR 30-59 ml/min (HCC) Followed by nephrology at [facility].  He does not recall the name of the specialist cc  3. Tobacco dependence -Did not tolerate nicotine patches.  He will try to gradually cut back as he has started doing  4. Vitamin B12 deficiency Continue vitamin B12 supplement - Vitamin B12  5. Anemia, chronic disease Reportedly had iron infusion recently  6. Chronic midline low back pain without sciatica  Not really having back pain but I wonder whether he may be having some pseudoclaudication. - DG Lumbar Spine Complete; Future  Patient was given the opportunity to ask questions.  Patient verbalized understanding of the plan and was able to repeat key elements of the plan.   Orders Placed This Encounter  Procedures  . DG Lumbar Spine Complete  . Vitamin B12  . POCT glucose (manual entry)  . POCT glycosylated hemoglobin (Hb A1C)     Requested  Prescriptions   Signed Prescriptions Disp Refills  . insulin aspart protamine - aspart (NOVOLOG 70/30 MIX) (70-30) 100 UNIT/ML FlexPen 15 mL 6    Sig: Inject 0.09 mLs (9 Units total) into the skin 2 (two) times daily with a meal.    Return in about 3 months (around 12/02/2017).  Karle Plumber, MD, FACP

## 2017-09-04 NOTE — Patient Instructions (Signed)
Continue to work on trying to quit smoking. Increase insulin to 9 units twice a day. Below is some information on healthy eating habits.  Follow a Healthy Eating Plan - You can do it! Limit sugary drinks.  Avoid sodas, sweet tea, sport or energy drinks, or fruit drinks.  Drink water, lo-fat milk, or diet drinks. Limit snack foods.   Cut back on candy, cake, cookies, chips, ice cream.  These are a special treat, only in small amounts. Eat plenty of vegetables.  Especially dark green, red, and orange vegetables. Aim for at least 3 servings a day. More is better! Include fruit in your daily diet.  Whole fruit is much healthier than fruit juice! Limit "white" bread, "white" pasta, "white" rice.   Choose "100% whole grain" products, brown or wild rice. Avoid fatty meats. Try "Meatless Monday" and choose eggs or beans one day a week.  When eating meat, choose lean meats like chicken, [place], and fish.  Grill, broil, or bake meats instead of frying, and eat poultry without the skin. Eat less salt.  Avoid frozen pizzas, frozen dinners and salty foods.  Use seasonings other than salt in cooking.  This can help blood pressure and keep you from swelling Beer, wine and liquor have calories.  If you can safely drink alcohol, limit to 1 drink per day for women, 2 drinks for men

## 2017-09-05 LAB — VITAMIN B12

## 2017-09-06 ENCOUNTER — Telehealth: Payer: Self-pay

## 2017-09-06 NOTE — Telephone Encounter (Signed)
Contacted pt to go over lab results pt is aware and doesn't have any questions or concerns 

## 2017-09-07 ENCOUNTER — Inpatient Hospital Stay: Payer: Medicare HMO

## 2017-09-07 ENCOUNTER — Inpatient Hospital Stay: Payer: Medicare HMO | Attending: Hematology and Oncology | Admitting: Hematology and Oncology

## 2017-09-07 VITALS — BP 145/81 | HR 81 | Temp 98.8°F | Resp 20 | Ht 64.0 in | Wt 156.0 lb

## 2017-09-07 DIAGNOSIS — Z5112 Encounter for antineoplastic immunotherapy: Secondary | ICD-10-CM | POA: Diagnosis not present

## 2017-09-07 DIAGNOSIS — C9001 Multiple myeloma in remission: Secondary | ICD-10-CM

## 2017-09-07 LAB — CBC WITH DIFFERENTIAL (CANCER CENTER ONLY)
BASOS ABS: 0 10*3/uL (ref 0.0–0.1)
Basophils Relative: 0 %
EOS ABS: 0.3 10*3/uL (ref 0.0–0.5)
EOS PCT: 6 %
HCT: 28.6 % — ABNORMAL LOW (ref 38.4–49.9)
Hemoglobin: 9.6 g/dL — ABNORMAL LOW (ref 13.0–17.1)
LYMPHS ABS: 0.7 10*3/uL — AB (ref 0.9–3.3)
Lymphocytes Relative: 14 %
MCH: 33.2 pg (ref 27.2–33.4)
MCHC: 33.4 g/dL (ref 32.0–36.0)
MCV: 99.4 fL — ABNORMAL HIGH (ref 79.3–98.0)
MONO ABS: 0.4 10*3/uL (ref 0.1–0.9)
Monocytes Relative: 7 %
Neutro Abs: 3.9 10*3/uL (ref 1.5–6.5)
Neutrophils Relative %: 73 %
PLATELETS: 209 10*3/uL (ref 140–400)
RBC: 2.88 MIL/uL — AB (ref 4.20–5.82)
RDW: 15.5 % — AB (ref 11.0–14.6)
WBC: 5.3 10*3/uL (ref 4.0–10.3)

## 2017-09-07 LAB — CMP (CANCER CENTER ONLY)
ALK PHOS: 93 U/L (ref 40–150)
ALT: 11 U/L (ref 0–55)
AST: 11 U/L (ref 5–34)
Albumin: 3.7 g/dL (ref 3.5–5.0)
Anion gap: 10 (ref 3–11)
BILIRUBIN TOTAL: 0.5 mg/dL (ref 0.2–1.2)
BUN: 29 mg/dL — AB (ref 7–26)
CO2: 23 mmol/L (ref 22–29)
CREATININE: 1.86 mg/dL — AB (ref 0.70–1.30)
Calcium: 9.5 mg/dL (ref 8.4–10.4)
Chloride: 103 mmol/L (ref 98–109)
GFR, EST NON AFRICAN AMERICAN: 35 mL/min — AB (ref >60)
GFR, Est AFR Am: 41 mL/min — ABNORMAL LOW (ref >60)
Glucose, Bld: 223 mg/dL — ABNORMAL HIGH (ref 70–140)
Potassium: 3.8 mmol/L (ref 3.5–5.1)
Sodium: 136 mmol/L (ref 136–145)
TOTAL PROTEIN: 6.8 g/dL (ref 6.4–8.3)

## 2017-09-10 ENCOUNTER — Telehealth: Payer: Self-pay

## 2017-09-10 ENCOUNTER — Ambulatory Visit (HOSPITAL_COMMUNITY)
Admission: RE | Admit: 2017-09-10 | Discharge: 2017-09-10 | Disposition: A | Discharge status: Home / Self Care | Payer: Medicare HMO | Source: Ambulatory Visit | Attending: Internal Medicine | Admitting: Internal Medicine

## 2017-09-10 DIAGNOSIS — M48061 Spinal stenosis, lumbar region without neurogenic claudication: Secondary | ICD-10-CM | POA: Diagnosis not present

## 2017-09-10 DIAGNOSIS — G8929 Other chronic pain: Secondary | ICD-10-CM | POA: Diagnosis not present

## 2017-09-10 DIAGNOSIS — M545 Low back pain, unspecified: Secondary | ICD-10-CM

## 2017-09-10 DIAGNOSIS — I700 Atherosclerosis of aorta: Secondary | ICD-10-CM | POA: Insufficient documentation

## 2017-09-10 LAB — UIFE/LIGHT CHAINS/TP QN, 24-HR UR
% BETA, URINE: 22.5 %
ALBUMIN, U: 54.8 %
ALPHA 1 URINE: 2.3 %
ALPHA 2 UR: 11.1 %
FREE LAMBDA LT CHAINS, UR: 4.73 mg/L (ref 0.24–6.66)
Free Kappa Lt Chains,Ur: 44.9 mg/L — ABNORMAL HIGH (ref 1.35–24.19)
Free Kappa/Lambda Ratio: 9.49 (ref 2.04–10.37)
GAMMA GLOBULIN URINE: 9.3 %
TOTAL PROTEIN, URINE-UPE24: 7.8 mg/dL
TOTAL PROTEIN, URINE-UR/DAY: 172 mg/(24.h) — AB (ref 30–150)
Total Volume: 2200

## 2017-09-10 NOTE — Telephone Encounter (Signed)
Contacted pt to go over xray results pt is aware and doesn't have any questions or concerns  

## 2017-09-12 ENCOUNTER — Other Ambulatory Visit: Payer: Self-pay | Admitting: Internal Medicine

## 2017-09-12 ENCOUNTER — Encounter (HOSPITAL_COMMUNITY): Payer: Self-pay

## 2017-09-12 MED FILL — PRAVASTATIN NA 20 MG TAB: 20 | 30 days supply | Qty: 30 | Fill #8

## 2017-09-12 MED FILL — GABAPENTIN 300 MG CAPSULE: 300 | 30 days supply | Qty: 120 | Fill #0

## 2017-09-12 MED FILL — AMLODIPINE BESYLATE 5 MG TA: 5 | 30 days supply | Qty: 30 | Fill #8

## 2017-09-13 LAB — TISSUE HYBRIDIZATION (BONE MARROW)-NCBH

## 2017-09-13 LAB — CHROMOSOME ANALYSIS, BONE MARROW

## 2017-09-18 ENCOUNTER — Encounter: Payer: Self-pay | Admitting: Hematology and Oncology

## 2017-09-18 NOTE — Progress Notes (Signed)
[place] [facility] Follow-up Visit:  Assessment: Multiple myeloma in remission [facility]) 72 y.o. male with diagnosis of IgA lambda active multiple myeloma, ISS Stage I. Active disease diagnosed based on presence of anemia, kidney insufficiency, and paraproteinemia with significant predominance of lambda light chains as well as significant elevation of IgA. Bone marrow biopsy confirmed presence of atypical monoclonal plasma cell process in the bone marrow comprising at least 7% of the cellularity. Based on the findings, patient was started on treatment with lenalidomide, bortezomib, and low-dose dexamethasone based on the anticipated tolerance by the patient. Lenalidomide was started at 10 mg daily based on creatinine clearance of 42, dexamethasone dose was reduced to 20 mg weekly based on patient's age. Treatment was complicated by rapid development of cutaneous rash attributed to lenalidomide, inaddition to decreased renal function and now patient has persistent severe anemia. Side-effects were attributed to Lenalidomide and lenalidomide was discontinued with subsequent recovery.  Patient has been receiving bortezomib weekly with low-dose dexamethasone in 4-day cycles.  Patient has completed induction systemic therapy for his disease with repeat bone marrow biopsy confirming complete response including no evidence of minimal residual disease by cytogenetics or FISH.  Patient is recovering well from his therapy.  At this time, our options include proceeding to consolidative therapy with high-dose chemotherapy and autologous stem cell transplantation, initiation of maintenance therapy and observation.  Initially, when the treatment was started, [name] to perform as well as he did and to demonstrate remarkable recovery of the performance status that he has accomplished.  At this point in time, I would recommend him to proceed with at least evaluation by [facility] oncology  for possible consolidative therapy.  At this time, patient is hesitant to proceed with that option nor is he terribly interested in maintenance therapy at this point in time, but he would like to consider his options on his own for a little bit.  Plan: -No current additional therapy until patient contacts [place] back to let us know if his decision. -If patient decides to proceed with consolidation therapy, will [facility]. -If he decides to continue with maintenance treatment, my preference would be to treat him with bortezomib administered every other week for a 2-year duration. -If he decides on observation alone, will bring him back to our clinic in 3 months with repeat lab work.    Voice recognition software was used and creation of this note. Despite my best effort at editing the text, some misspelling/errors may have occurred.  Orders Placed This Encounter  Procedures  . CBC with Differential (Cancer Center Only)    Standing Status:   Future    Number of Occurrences:   1    Standing Expiration Date:   09/08/2018  . CMP ([place] only)    Standing Status:   Future    Number of Occurrences:   1    Standing Expiration Date:   09/08/2018  . CBC with Differential ([place] Only)  . CBC with Differential (Cancer Center Only)    Standing Status:   Future    Standing Expiration Date:   09/08/2018  . Magnesium    Standing Status:   Future    Standing Expiration Date:   09/07/2018  . CMP ([place] only)    Standing Status:   Future    Standing Expiration Date:   09/08/2018    Cancer Staging Multiple myeloma in remission [facility]) Staging form: Plasma Cell Myeloma  and Plasma Cell Disorders, AJCC 8th Edition - Clinical stage from 03/21/2017: RISS Stage I (Beta-2-microglobulin (mg/L): 3.2, Albumin (g/dL): 3.6, ISS: Stage I, High-risk cytogenetics: Absent, LDH: Normal) - Signed by [name], MD on 04/04/2017 - Clinical: No stage assigned - Unsigned   All questions  were answered.  . The patient knows to call the clinic with any problems, questions or concerns.  This note was electronically signed.    History of Presenting Illness Robert Sparks is a 72 y.o. male followed in the [place] for new diagnosis of IgA lambda active multiple myeloma. At presentation, [name] reported having some night sweats, but no weight loss. He indicated weakness in bilateral thighs associated with the feet and legs. Complained of constipation, but denies any numbness or tingling in hands and feet. Denied any new musculoskeletal pain. No chest pain, shortness of breath or cough. No nausea, vomiting, abdominal pain, diarrhea, or early satiety. Additional evaluation was conducted resulting in negative imaging including negative skeletal survey on PET/CT. Bone marrow biopsy returned positive with 7% involvement with atypical monoclonal plasma cells. In conjunction with kidney injury, anemia, dysproteinemia with significant elevation of lambda light chains and IgA, this confirmed a diagnosis of active multiple myeloma warranting intervention.   Patient has completed his induction systemic therapy for his underlying myeloma and returns to the clinic to review results of the bone marrow biopsy which demonstrated complete response including no evidence of minimal residual disease by cytogenetics and FISH.  Patient reports feeling well with no side effects attributable to the treatment.  Oncological/hematological History: --Labs, 10/28/15: WBC 6.0, Hgb 10.3, Plt 245;   --Labs, 10/02/16: WBC 5.1, Hgb 10.0, Plt 227; ANA -- negative --Labs, 11/10/16: tProt 6.3, Alb 3.4, Ca   9.1, Cr 2.56, AP 82; WBC 4.7, Hgb 8.1, Plt 201; Fe 43, FeSat 16%, TIBC 272, Ferritin 476, Vit B12 199, MMA 283; TSH 1.57 --Labs, 12/25/16: tProt 7.0, Alb 4.1, Ca   9.9, Cr 1.47, AP ...; SPEP -- M-spike 0.8g/dL; kappa 40.7, lambda 305.1, KLR 0.13; WBC 3.4, Hgb 13.2, Plt 154; --Labs, 02/09/17: tProt 8.0, Alb 3.9,  Ca 10.1, Cr 1.80, AP 97; SPEP -- M-spike 0.7g.dL, IgA lambda; IgA 1220, IgG 666, IgM 52; kappa 33.5, lambda 433.5, KLR 0.08; WBC 6.3, Hgb 10.6, Plt 194 --Labs, 04/05/17: tProt 7.0, Alb 3.5, Ca   8.9, Cr 2.20, AP 75; SPEP -- M-spike 0.4g/dL, IgA lambda; IgA   433, IgG 683, IgM 52; kappa 59.7, lambda   42.7, KLR 1.40; WBC 5.0, Hgb   7.4, Plt 280;    Multiple myeloma in remission ([place])   02/16/2017 Imaging    Skeletal Survey: No definite lytic myelomatous lesions in the axial or appendicular skeleton      02/23/2017 Initial Diagnosis    Multiple myeloma not having achieved remission ([place])      03/09/2017 Imaging    PET-CT: No abnormal hypermetabolic activity or other specific findings of active myeloma. Several adjacent healing left lateral rib fractures.      03/13/2017 Pathology Results    Bone Marrow Bx: HYPERCELLULAR BONE MARROW FOR AGE WITH PLASMA CELL NEOPLASM. TRILINEAGE HEMATOPOIESIS. NORMOCYTIC-NORMOCHROMIA ANEMIA. EOSINOPHILIA. The bone marrow shows slight increase in atypical plasma cells representing 7% of all cells associated with small clusters in the clot and biopsy sections. Immunohistochemical stains highlight the plasma cell component in the bone marrow which shows lambda light chain restriction consistent with plasma cell neoplasm. The background shows trilineage hematopoiesis with non-specific myeloid changes. The peripheral  blood shows eosinophilia without an associated increase in eosinophils in the bone marrow. The changes may represent a hypersensitivity reaction. Cytogenetics/FISH: 46,XY -- Positive for +11, +17/17p+ -- extra CCND1 copy in 9% cells, gain of ATM copy in 9% & 11% cells with p53 gain.        03/16/2017 -  Chemotherapy    Induction chemotherapy: Lenalidomide 34m PO Qday, d1-14 + Bortezomib 1.391mm2 IV d1,4,8,11 + dexamethasone 2041mO QWk Q21d --Cycle #1, 09/89/16/94omplicated by grade 2 rash within 1 week of initiation, as well as a aggressive rising  creatinine; d11 held due to worsening rash --Cycle #2, 04/13/17: Initiated without lenalidomide due to persistent Grade 3 anemia --Cycle #3, 05/18/17: dosing changed to weekly bortezomib, cycle duration extended to 4 weeks --Cycle #4, 06/15/17: --Cycle #5, 07/13/17: --Cycle #6, 08/10/17:       06/08/2017 Pathology Results    BM Bx:  --Pathology: Plasma cells are down to 1% with polyclonal appearance. --CytoGen: Normal, 46,XY      06/15/2017 Tumor Marker    tProt 7.1, Alb 3.8, Ca 9.7, Cr 1.6, AP ... LDH 153, beta-2 microglobulin 3.3 SPEP -- No M-spike; SIFE -- normal IgG 688, IgA 105, IgM 47; kappa 50.7, lambda 19.3, KLR 2.63 WBC 3.6, ANC 2.1, Hgb 9.4, Plt 204      08/10/2017 Tumor Marker    tProt 7.0, Alb 3.9, Ca 9.5, Cr 2.3, AP 172 LDH 144, beta-2 microglobulin 3.6 SPEP -- pending IgG 771, IgA 123, IgM 55; kappa -- pending, lambda -- pending WBC 6.5, ANC 5.1, Hgb 9.4, Plt 228      08/29/2017 Remission    BM Bx: HYPERCELLULAR BONE MARROW FOR AGE WITH TRILINEAGE HEMATOPOIESIS AND 1% PLASMA CELLS. The bone marrow is hypercellular for age with trilineage hematopoiesis but with slight erythroid hyperplasia. There are nonspecific myeloid changes present likely related to previous therapy. The plasma cells represent 1% of all cells in the aspirate with lack of large aggregates and display polyclonal staining pattern for kappa and lambda light chains. Hence, there is no definite evidence of residual plasma cell neoplasm in this material.  --CytoGen: No clonal abnormalities --FISH: normal results       Medical History: Past Medical History:  Diagnosis Date  . Anemia   . Arthritis    shoulders,feet   . Cataract    per eye dr -has appt 5-11  . CKD (chronic kidney disease), stage III ([place] . Elevated PSA   . History of ketoacidosis    03-29-2015   . History of sepsis    07-25-2013  non-compliant w/ medication  . Hyperlipidemia   . Hypertension   . Nocturia   . Peripheral  neuropathy   . Prostate cancer ([facility] . [facility]   Surgical History: Past Surgical History:  Procedure Laterality Date  . CIRCUMCISION  1990's  . PROSTATE BIOPSY N/A 12/21/2015   Procedure: BIOPSY TRANSRECTAL ULTRASONIC PROSTATE (TUBP);  Surgeon: MatFestus AloeD;  [facility];  Laterality: N/A;    Family History: Family History  Problem Relation Age of Onset  . Cancer Neg Hx   . Colon cancer Neg Hx   . Colon polyps Neg Hx   . Esophageal cancer Neg Hx   . Rectal cancer Neg Hx   . Stomach cancer Neg Hx     Social History: Social History   Socioeconomic History  . Marital status: Single  Spouse name: Not on file  . Number of children: Not on file  . Years of education: Not on file  . Highest education level: Not on file  Social Needs  . Financial resource strain: Not on file  . Food insecurity - worry: Not on file  . Food insecurity - inability: Not on file  . Transportation needs - medical: Not on file  . Transportation needs - non-medical: Not on file  Occupational History  . Not on file  Tobacco Use  . Smoking status: Current Every Day Smoker    Packs/day: 0.25    Years: 50.00    Pack years: 12.50    Types: Cigarettes  . Smokeless tobacco: Never Used  . Tobacco comment: 1 ppwk-4-5 a day   Substance and Sexual Activity  . Alcohol use: Yes    Alcohol/week: 0.6 oz    Types: 1 Cans of beer per week    Comment: 1 every day-quit couple weeks ago  . Drug use: No  . Sexual activity: Not Currently  Other Topics Concern  . Not on file  Social History Narrative  . Not on file    Allergies: No Known Allergies  Medications:  Current Outpatient Medications  Medication Sig Dispense Refill  . ACCU-CHEK AVIVA PLUS test strip 1 each by Other route See admin instructions. 100 each 12  . acyclovir (ZOVIRAX) 400 MG tablet Take 1 tablet (400 mg total) by mouth 2 (two) times  daily. 60 tablet 3  . amLODipine (NORVASC) 5 MG tablet Take 1 tablet (5 mg total) by mouth daily. 90 tablet 3  . Ascorbic Acid (VITAMIN C) 1000 MG tablet Take 1,000 mg by mouth daily.    [name] aspirin EC 81 MG tablet Take 1 tablet (81 mg total) by mouth daily. 90 tablet 3  . Cholecalciferol (VITAMIN D) 2000 units CAPS Take 2,000 Units by mouth daily.    . cyanocobalamin (V-R VITAMIN B-12) 500 MCG tablet Take 1 tablet (500 mcg total) by mouth 2 (two) times daily. 120 tablet 6  . dexamethasone (DECADRON) 4 MG tablet TAKE 5 TABLETS BY MOUTH ONCE A WEEK. 15 tablet 0  . ferrous sulfate 325 (65 FE) MG tablet Take 325 mg by mouth daily with breakfast.    . folic acid (FOLVITE) 1 MG tablet Take 1 tablet (1 mg total) by mouth daily. 60 tablet 0  . insulin aspart protamine - aspart (NOVOLOG 70/30 MIX) (70-30) 100 UNIT/ML FlexPen Inject 0.09 mLs (9 Units total) into the skin 2 (two) times daily with a meal. 15 mL 6  . lidocaine-prilocaine (EMLA) cream Apply to affected area once 30 g 3  . mometasone (ELOCON) 0.1 % cream Apply 1 application topically daily. 45 g 3  . Multiple Vitamin (MULTIVITAMIN WITH MINERALS) TABS tablet Take 1 tablet by mouth daily. 60 tablet 2  . NONFORMULARY OR COMPOUNDED [place]  Combination Pain Cream -  Baclofen 2%, Doxepin 5%, Gabapentin 6%, Topiramate 2%, Pentoxifylline 3% Apply 1-2 grams to affected area 3-4 times daily Qty. 120 gm 3 refills 1 each 2  . ondansetron (ZOFRAN) 8 MG tablet Take 1 tablet (8 mg total) by mouth 2 (two) times daily as needed (Nausea or vomiting). 30 tablet 1  . potassium chloride (K-DUR) 10 MEQ tablet TAKE 1 TABLET BY MOUTH DAILY. 7 tablet 0  . pravastatin (PRAVACHOL) 20 MG tablet Take 1 tablet (20 mg total) by mouth daily. 90 tablet 3  . prochlorperazine (COMPAZINE) 10 MG tablet Take 1 tablet (  10 mg total) by mouth every 6 (six) hours as needed (Nausea or vomiting). 30 tablet 1  . senna-docusate (SENOKOT-S) 8.6-50 MG tablet Take 2 tablets  by mouth 2 (two) times daily. 60 tablet 2  . gabapentin (NEURONTIN) 300 MG capsule TAKE 1 CAPSULE BY MOUTH IN THE MORNING AND AT LUNCH, TAKE 2 CAPSULES BY MOUTH AT NIGHT. 120 capsule 4  . potassium chloride (K-DUR) 10 MEQ tablet Take 2 tablets (20 mEq total) by mouth daily. 60 tablet 0   Current Facility-Administered Medications  Medication Dose Route Frequency Provider Last Rate Last Dose  . 0.9 %  sodium chloride infusion  500 mL Intravenous Continuous Pyrtle, Lajuan Lines, MD        Review of Systems: Review of Systems  Constitutional: Positive for fatigue.  Eyes: Negative.   Cardiovascular: Negative.   Endocrine: Negative.   Genitourinary: Negative.    Hematological: Negative.   Psychiatric/Behavioral: Negative.   All other systems reviewed and are negative.    PHYSICAL EXAMINATION Blood pressure (!) 145/81, pulse 81, temperature 98.8 F (37.1 C), temperature source Oral, resp. rate 20, height '5\' 4"'  (1.626 m), weight 156 lb (70.8 kg), SpO2 100 %.  ECOG PERFORMANCE STATUS: 2 - Symptomatic, <50% confined to bed  Physical Exam  Constitutional: He is oriented to person, place, and time and well-developed, well-nourished, and in no distress. No distress.  HENT:  Head: Normocephalic and atraumatic.  Mouth/Throat: Oropharynx is clear and moist. No oropharyngeal exudate.  Eyes: Conjunctivae and EOM are normal. Pupils are equal, round, and reactive to light. No scleral icterus.  Neck: No thyromegaly present.  Cardiovascular: Normal rate and regular rhythm.  No murmur heard. Pulmonary/Chest: Effort normal and breath sounds normal. No respiratory distress. He has no wheezes.  Abdominal: Soft. He exhibits no distension and no mass. There is no tenderness.  Musculoskeletal: He exhibits no edema or tenderness.  Lymphadenopathy:    He has no cervical adenopathy.  Neurological: He is alert and oriented to person, place, and time. He has normal reflexes. No cranial nerve deficit.  Skin: Skin  is warm and dry. No rash noted. He is not diaphoretic. No erythema.     LABORATORY DATA: I have personally reviewed the data as listed: Office Visit on 09/07/2017  Component Date Value Ref Range Status  . WBC Count 09/07/2017 5.3  4.0 - 10.3 K/uL Final  . RBC 09/07/2017 2.88* 4.20 - 5.82 MIL/uL Final  . Hemoglobin 09/07/2017 9.6* 13.0 - 17.1 g/dL Final  . HCT 09/07/2017 28.6* 38.4 - 49.9 % Final  . MCV 09/07/2017 99.4* 79.3 - 98.0 fL Final  . MCH 09/07/2017 33.2  27.2 - 33.4 pg Final  . MCHC 09/07/2017 33.4  32.0 - 36.0 g/dL Final  . RDW 09/07/2017 15.5* 11.0 - 14.6 % Final  . Platelet Count 09/07/2017 209  140 - 400 K/uL Final  . Neutrophils Relative % 09/07/2017 73  % Final  . Neutro Abs 09/07/2017 3.9  1.5 - 6.5 K/uL Final  . Lymphocytes Relative 09/07/2017 14  % Final  . Lymphs Abs 09/07/2017 0.7* 0.9 - 3.3 K/uL Final  . Monocytes Relative 09/07/2017 7  % Final  . Monocytes Absolute 09/07/2017 0.4  0.1 - 0.9 K/uL Final  . Eosinophils Relative 09/07/2017 6  % Final  . Eosinophils Absolute 09/07/2017 0.3  0.0 - 0.5 K/uL Final  . Basophils Relative 09/07/2017 0  % Final  . Basophils Absolute 09/07/2017 0.0  0.0 - 0.1 K/uL Final   Performed at  [facility], [place] [address] [place], [place] [address]  Appointment on 09/07/2017  Component Date Value Ref Range Status  . Total Protein, Urine 09/07/2017 7.8  Not Estab. mg/dL Final  . Total Protein, Urine-Ur/day 09/07/2017 172* 30 - 150 mg/24 hr Final  . Albumin, U 09/07/2017 54.8  % Final  . ALPHA 1 URINE 09/07/2017 2.3  % Final  . Alpha 2, Urine 09/07/2017 11.1  % Final  . % BETA, Urine 09/07/2017 22.5  % Final  . GAMMA GLOBULIN URINE 09/07/2017 9.3  % Final  . Free Kappa Lt Chains,Ur 09/07/2017 44.90* 1.35 - 24.19 mg/L Final  . Free Lambda Lt Chains,Ur 09/07/2017 4.73  0.24 - 6.66 mg/L Corrected   **Results verified by repeat testing**  . Free Kappa/Lambda Ratio 09/07/2017 9.49  2.04 - 10.37 Corrected    Comment: (NOTE) Performed At: [facility] [place], [place] [id number] Rush Farmer MD IT:[id number]   . Immunofixation Result, Urine 09/07/2017 Comment   Corrected   An apparent normal immunofixation pattern.  . Total Volume 09/07/2017 2,200   Final  . M-SPIKE %, Urine 09/07/2017 Not Observed  Not Observed % Corrected  . Note: 09/07/2017 Comment   Corrected   Comment: (NOTE) Protein electrophoresis scan will follow via computer, mail, or courier delivery. Performed at [facility], [place] [address], [place], [place] [address]   . Sodium 09/07/2017 136  136 - 145 mmol/L Final  . Potassium 09/07/2017 3.8  3.5 - 5.1 mmol/L Final  . Chloride 09/07/2017 103  98 - 109 mmol/L Final  . CO2 09/07/2017 23  22 - 29 mmol/L Final  . Glucose, Bld 09/07/2017 223* 70 - 140 mg/dL Final  . BUN 09/07/2017 29* 7 - 26 mg/dL Final  . Creatinine 09/07/2017 1.86* 0.70 - 1.30 mg/dL Final  . Calcium 09/07/2017 9.5  8.4 - 10.4 mg/dL Final  . Total Protein 09/07/2017 6.8  6.4 - 8.3 g/dL Final  . Albumin 09/07/2017 3.7  3.5 - 5.0 g/dL Final  . AST 09/07/2017 11  5 - 34 U/L Final  . ALT 09/07/2017 11  0 - 55 U/L Final  . Alkaline Phosphatase 09/07/2017 93  40 - 150 U/L Final  . Total Bilirubin 09/07/2017 0.5  0.2 - 1.2 mg/dL Final  . GFR, Est Non Af Am 09/07/2017 35* >60 mL/min Final  . GFR, Est AFR Am 09/07/2017 41* >60 mL/min Final   Comment: (NOTE) The eGFR has been calculated using the CKD EPI equation. This calculation has not been validated in all clinical situations. eGFR's persistently <60 mL/min signify possible Chronic Kidney Disease.   Georgiann Hahn gap 09/07/2017 10  3 - 11 Final   Performed at [facility], [place] [address] [place], [place] [address]  Office Visit on 09/04/2017  Component Date Value Ref Range Status  . POC Glucose 09/04/2017 129* 70 - 99 mg/dl Final  . Hemoglobin A1C 09/04/2017 7.8   Final  . Vitamin B-12  09/04/2017 >2000* 232 - 1245 pg/mL Final       [name], MD

## 2017-09-18 NOTE — Assessment & Plan Note (Signed)
72 y.o. male with diagnosis of IgA lambda active multiple myeloma, ISS Stage I. Active disease diagnosed based on presence of anemia, kidney insufficiency, and paraproteinemia with significant predominance of lambda light chains as well as significant elevation of IgA. Bone marrow biopsy confirmed presence of atypical monoclonal plasma cell process in the bone marrow comprising at least 7% of the cellularity. Based on the findings, patient was started on treatment with lenalidomide, bortezomib, and low-dose dexamethasone based on the anticipated tolerance by the patient. Lenalidomide was started at 10 mg daily based on creatinine clearance of 42, dexamethasone dose was reduced to 20 mg weekly based on patient's age. Treatment was complicated by rapid development of cutaneous rash attributed to lenalidomide, inaddition to decreased renal function and now patient has persistent severe anemia. Side-effects were attributed to Lenalidomide and lenalidomide was discontinued with subsequent recovery.  Patient has been receiving bortezomib weekly with low-dose dexamethasone in 4-day cycles.  Patient has completed induction systemic therapy for his disease with repeat bone marrow biopsy confirming complete response including no evidence of minimal residual disease by cytogenetics or FISH.  Patient is recovering well from his therapy.  At this time, our options include proceeding to consolidative therapy with high-dose chemotherapy and autologous stem cell transplantation, initiation of maintenance therapy and observation.  Initially, when the treatment was started, [name] to perform as well as he did and to demonstrate remarkable recovery of the performance status that he has accomplished.  At this point in time, I would recommend him to proceed with at least evaluation by [facility] oncology for possible consolidative therapy.  At this time, patient is hesitant to proceed with that option nor  is he terribly interested in maintenance therapy at this point in time, but he would like to consider his options on his own for a little bit.  Plan: -No current additional therapy until patient contacts [place] back to let us know if his decision. -If patient decides to proceed with consolidation therapy, will [facility]. -If he decides to continue with maintenance treatment, my preference would be to treat him with bortezomib administered every other week for a 2-year duration. -If he decides on observation alone, will bring him back to our clinic in 3 months with repeat lab work.

## 2017-09-21 ENCOUNTER — Inpatient Hospital Stay (HOSPITAL_BASED_OUTPATIENT_CLINIC_OR_DEPARTMENT_OTHER): Payer: Medicare HMO | Admitting: Hematology and Oncology

## 2017-09-21 ENCOUNTER — Inpatient Hospital Stay: Payer: Medicare HMO

## 2017-09-21 ENCOUNTER — Encounter: Payer: Self-pay | Admitting: Hematology and Oncology

## 2017-09-21 ENCOUNTER — Telehealth: Payer: Self-pay

## 2017-09-21 VITALS — BP 137/82 | HR 80 | Temp 98.9°F | Resp 18 | Ht 64.0 in | Wt 157.3 lb

## 2017-09-21 DIAGNOSIS — Z5111 Encounter for antineoplastic chemotherapy: Secondary | ICD-10-CM

## 2017-09-21 DIAGNOSIS — C9001 Multiple myeloma in remission: Secondary | ICD-10-CM

## 2017-09-21 DIAGNOSIS — Z5112 Encounter for antineoplastic immunotherapy: Secondary | ICD-10-CM | POA: Diagnosis not present

## 2017-09-21 LAB — CBC WITH DIFFERENTIAL (CANCER CENTER ONLY)
Basophils Absolute: 0 10*3/uL (ref 0.0–0.1)
Basophils Relative: 0 %
EOS ABS: 0.8 10*3/uL — AB (ref 0.0–0.5)
Eosinophils Relative: 10 %
HCT: 28.8 % — ABNORMAL LOW (ref 38.4–49.9)
Hemoglobin: 9.7 g/dL — ABNORMAL LOW (ref 13.0–17.1)
LYMPHS ABS: 1 10*3/uL (ref 0.9–3.3)
Lymphocytes Relative: 14 %
MCH: 33.1 pg (ref 27.2–33.4)
MCHC: 33.7 g/dL (ref 32.0–36.0)
MCV: 98.3 fL — AB (ref 79.3–98.0)
MONOS PCT: 7 %
Monocytes Absolute: 0.5 10*3/uL (ref 0.1–0.9)
NEUTROS PCT: 69 %
Neutro Abs: 5.2 10*3/uL (ref 1.5–6.5)
Platelet Count: 191 10*3/uL (ref 140–400)
RBC: 2.93 MIL/uL — ABNORMAL LOW (ref 4.20–5.82)
RDW: 13.7 % (ref 11.0–14.6)
WBC Count: 7.5 10*3/uL (ref 4.0–10.3)

## 2017-09-21 LAB — CMP (CANCER CENTER ONLY)
ALBUMIN: 3.7 g/dL (ref 3.5–5.0)
ALT: 14 U/L (ref 0–55)
ANION GAP: 11 (ref 3–11)
AST: 16 U/L (ref 5–34)
Alkaline Phosphatase: 90 U/L (ref 40–150)
BUN: 34 mg/dL — ABNORMAL HIGH (ref 7–26)
CALCIUM: 9.5 mg/dL (ref 8.4–10.4)
CO2: 20 mmol/L — AB (ref 22–29)
CREATININE: 1.65 mg/dL — AB (ref 0.70–1.30)
Chloride: 103 mmol/L (ref 98–109)
GFR, Est AFR Am: 47 mL/min — ABNORMAL LOW (ref >60)
GFR, Estimated: 41 mL/min — ABNORMAL LOW (ref >60)
GLUCOSE: 164 mg/dL — AB (ref 70–140)
Potassium: 3.9 mmol/L (ref 3.5–5.1)
SODIUM: 134 mmol/L — AB (ref 136–145)
Total Bilirubin: 0.4 mg/dL (ref 0.2–1.2)
Total Protein: 6.6 g/dL (ref 6.4–8.3)

## 2017-09-21 LAB — MAGNESIUM: Magnesium: 3.5 mg/dL — ABNORMAL HIGH (ref 1.5–2.5)

## 2017-09-21 MED ORDER — PROCHLORPERAZINE MALEATE 10 MG PO TABS
ORAL_TABLET | ORAL | Status: AC
  Filled 2017-09-21: qty 1

## 2017-09-21 MED ORDER — BORTEZOMIB CHEMO SQ INJECTION 3.5 MG (2.5MG/ML)
1.3000 mg/m2 | Freq: Once | INTRAMUSCULAR | Status: AC
  Administered 2017-09-21: 2.25 mg via SUBCUTANEOUS
  Filled 2017-09-21: qty 2.25

## 2017-09-21 MED ORDER — PROCHLORPERAZINE MALEATE 10 MG PO TABS
10.0000 mg | ORAL_TABLET | Freq: Once | ORAL | Status: AC
  Administered 2017-09-21: 10 mg via ORAL

## 2017-09-21 NOTE — Telephone Encounter (Signed)
Printed avs and calender of [place] appointment. Per 3/15 los

## 2017-09-21 NOTE — Patient Instructions (Signed)
Steps to Quit Smoking Smoking tobacco can be bad for your health. It can also affect almost every organ in your body. Smoking puts you and people around you at risk for many serious long-lasting (chronic) diseases. Quitting smoking is hard, but it is one of the best things that you can do for your health. It is never too late to quit. What are the benefits of quitting smoking? When you quit smoking, you lower your risk for getting serious diseases and conditions. They can include:  Lung cancer or lung disease.  Heart disease.  Stroke.  Heart attack.  Not being able to have children (infertility).  Weak bones (osteoporosis) and broken bones (fractures).  If you have coughing, wheezing, and shortness of breath, those symptoms may get better when you quit. You may also get sick less often. If you are pregnant, quitting smoking can help to lower your chances of having a baby of low birth weight. What can I do to help me quit smoking? Talk with your doctor about what can help you quit smoking. Some things you can do (strategies) include:  Quitting smoking totally, instead of slowly cutting back how much you smoke over a period of time.  Going to in-person counseling. You are more likely to quit if you go to many counseling sessions.  Using resources and support systems, such as: ? Online chats with a counselor. ? Phone quitlines. ? Printed self-help materials. ? Support groups or group counseling. ? Text messaging programs. ? Mobile phone apps or applications.  Taking medicines. Some of these medicines may have nicotine in them. If you are pregnant or breastfeeding, do not take any medicines to quit smoking unless your doctor says it is okay. Talk with your doctor about counseling or other things that can help you.  Talk with your doctor about using more than one strategy at the same time, such as taking medicines while you are also going to in-person counseling. This can help make  quitting easier. What things can I do to make it easier to quit? Quitting smoking might feel very hard at first, but there is a lot that you can do to make it easier. Take these steps:  Talk to your family and friends. Ask them to support and encourage you.  Call phone quitlines, reach out to support groups, or work with a counselor.  Ask people who smoke to not smoke around you.  Avoid places that make you want (trigger) to smoke, such as: ? Bars. ? Parties. ? Smoke-break areas at work.  Spend time with people who do not smoke.  Lower the stress in your life. Stress can make you want to smoke. Try these things to help your stress: ? Getting regular exercise. ? Deep-breathing exercises. ? Yoga. ? Meditating. ? Doing a body scan. To do this, close your eyes, focus on one area of your body at a time from head to toe, and notice which parts of your body are tense. Try to relax the muscles in those areas.  Download or buy apps on your mobile phone or tablet that can help you stick to your quit plan. There are many free apps, such as QuitGuide from the CDC (Centers for Disease Control and Prevention). You can find more support from smokefree.gov and other websites.  This information is not intended to replace advice given to you by your health care provider. Make sure you discuss any questions you have with your health care provider. Document Released: 04/22/2009 Document   Revised: 02/22/2016 Document Reviewed: 11/10/2014 Elsevier Interactive Patient Education  2018 [employer]  

## 2017-09-21 NOTE — Progress Notes (Signed)
Per Dr. Lebron Conners, ok to treat with Creat of 1.65 today.

## 2017-09-21 NOTE — Patient Instructions (Signed)
[facility] Discharge Instructions for Patients Receiving Chemotherapy  Today you received the following chemotherapy agents Velcade.  To help prevent nausea and vomiting after your treatment, we encourage you to take your nausea medication as directed.  If you develop nausea and vomiting that is not controlled by your nausea medication, call the clinic.   BELOW ARE SYMPTOMS THAT SHOULD BE REPORTED IMMEDIATELY:  *FEVER GREATER THAN 100.5 F  *CHILLS WITH OR WITHOUT FEVER  NAUSEA AND VOMITING THAT IS NOT CONTROLLED WITH YOUR NAUSEA MEDICATION  *UNUSUAL SHORTNESS OF BREATH  *UNUSUAL BRUISING OR BLEEDING  TENDERNESS IN MOUTH AND THROAT WITH OR WITHOUT PRESENCE OF ULCERS  *URINARY PROBLEMS  *BOWEL PROBLEMS  UNUSUAL RASH Items with * indicate a potential emergency and should be followed up as soon as possible.  Feel free to call the clinic should you have any questions or concerns. The clinic phone number is [phone].  Please show the CHEMO ALERT CARD at check-in to the Emergency Department and triage nurse.   

## 2017-09-24 ENCOUNTER — Other Ambulatory Visit: Payer: Self-pay | Admitting: Hematology and Oncology

## 2017-09-24 DIAGNOSIS — C9000 Multiple myeloma not having achieved remission: Secondary | ICD-10-CM

## 2017-09-24 MED FILL — ACYCLOVIR 400 MG TABLET: 400 | 30 days supply | Qty: 60 | Fill #3

## 2017-09-24 MED FILL — TRUEPLUS PEN NDL 32GX5/32: 32G X 4 MM | 50 days supply | Qty: 100 | Fill #1

## 2017-09-24 MED FILL — TRUEPLUS PEN NDL 32GX5/32": 32G X 4 MM | 50 days supply | Qty: 100 | Fill #1

## 2017-09-24 MED FILL — ACCU-CHEK AVIVA PLUS TEST S: 30 days supply | Qty: 100 | Fill #1

## 2017-09-30 NOTE — Assessment & Plan Note (Signed)
72 y.o. male with diagnosis of IgA lambda active multiple myeloma, ISS Stage I. Active disease diagnosed based on presence of anemia, kidney insufficiency, and paraproteinemia with significant predominance of lambda light chains as well as significant elevation of IgA. Bone marrow biopsy confirmed presence of atypical monoclonal plasma cell process in the bone marrow comprising at least 7% of the cellularity. Based on the findings, patient was started on treatment with lenalidomide, bortezomib, and low-dose dexamethasone based on the anticipated tolerance by the patient. Lenalidomide was started at 10 mg daily based on creatinine clearance of 42, dexamethasone dose was reduced to 20 mg weekly based on patient's age. Treatment was complicated by rapid development of cutaneous rash attributed to lenalidomide, inaddition to decreased renal function and now patient has persistent severe anemia. Side-effects were attributed to Lenalidomide and lenalidomide was discontinued with subsequent recovery.  Patient has been receiving bortezomib weekly with low-dose dexamethasone in 4-day cycles.  Patient has completed induction systemic therapy for his disease with repeat bone marrow biopsy confirming complete response including no evidence of minimal residual disease by cytogenetics or FISH.  Patient is recovering well from his therapy.  At this time, our options include proceeding to consolidative therapy with high-dose chemotherapy and autologous stem cell transplantation, initiation of maintenance therapy and observation.  Initially, when the treatment was started, [name] to perform as well as he did and to demonstrate remarkable recovery of the performance status that he has accomplished.  At this point in time, I would recommend him to proceed with at least evaluation by [facility] oncology for possible consolidative therapy.  At this time, patient is hesitant to proceed with that option  after some consideration, patient agreed to proceed with maintenance systemic therapy.  I have recommended bortezomib administered every other week for a total duration of 2 years.  No further zoledronic acid due to previous history of significant creatinine spikes every time the medication was used.  Plan: -Proceed with maintenance bortezomib therapy  administered every other week.  Plan is to continue treatment for 2 years or until the disease progression. - Continue acyclovir, patient can stop oral iron. -Will conduct disease assessment with biochemical panel including SPEP, S IFE, serum free light chain assays every 3 months. -Return to clinic in 1 month: Full labs, including iron panel and B12, clinic visit, bortezomib administration.

## 2017-09-30 NOTE — Progress Notes (Signed)
[facility] Follow-up Visit:  Assessment: Multiple myeloma in remission [facility]) 72 y.o. male with diagnosis of IgA lambda active multiple myeloma, ISS Stage I. Active disease diagnosed based on presence of anemia, kidney insufficiency, and paraproteinemia with significant predominance of lambda light chains as well as significant elevation of IgA. Bone marrow biopsy confirmed presence of atypical monoclonal plasma cell process in the bone marrow comprising at least 7% of the cellularity. Based on the findings, patient was started on treatment with lenalidomide, bortezomib, and low-dose dexamethasone based on the anticipated tolerance by the patient. Lenalidomide was started at 10 mg daily based on creatinine clearance of 42, dexamethasone dose was reduced to 20 mg weekly based on patient's age. Treatment was complicated by rapid development of cutaneous rash attributed to lenalidomide, inaddition to decreased renal function and now patient has persistent severe anemia. Side-effects were attributed to Lenalidomide and lenalidomide was discontinued with subsequent recovery.  Patient has been receiving bortezomib weekly with low-dose dexamethasone in 4-day cycles.  Patient has completed induction systemic therapy for his disease with repeat bone marrow biopsy confirming complete response including no evidence of minimal residual disease by cytogenetics or FISH.  Patient is recovering well from his therapy.  At this time, our options include proceeding to consolidative therapy with high-dose chemotherapy and autologous stem cell transplantation, initiation of maintenance therapy and observation.  Initially, when the treatment was started, [name] to perform as well as he did and to demonstrate remarkable recovery of the performance status that he has accomplished.  At this point in time, I would recommend him to proceed with at least evaluation by [facility] oncology  for possible consolidative therapy.  At this time, patient is hesitant to proceed with that option after some consideration, patient agreed to proceed with maintenance systemic therapy.  I have recommended bortezomib administered every other week for a total duration of 2 years.  No further zoledronic acid due to previous history of significant creatinine spikes every time the medication was used.  Plan: -Proceed with maintenance bortezomib therapy  administered every other week.  Plan is to continue treatment for 2 years or until the disease progression. - Continue acyclovir, patient can stop oral iron. -Will conduct disease assessment with biochemical panel including SPEP, S IFE, serum free light chain assays every 3 months. -Return to clinic in 1 month: Full labs, including iron panel and B12, clinic visit, bortezomib administration.  Voice recognition software was used and creation of this note. Despite my best effort at editing the text, some misspelling/errors may have occurred.  Orders Placed This Encounter  Procedures  . CBC with Differential (Cancer Center Only)    Standing Status:   Future    Standing Expiration Date:   09/22/2018  . CMP ([place] only)    Standing Status:   Future    Standing Expiration Date:   09/22/2018  . Lactate dehydrogenase (LDH)    Standing Status:   Future    Standing Expiration Date:   09/21/2018  . Beta 2 microglobulin    Standing Status:   Future    Standing Expiration Date:   09/21/2018  . Multiple Myeloma Panel (SPEP&IFE w/QIG)    Standing Status:   Future    Standing Expiration Date:   09/21/2018  . Kappa/lambda light chains    Standing Status:   Future    Standing Expiration Date:   09/21/2018    Cancer Staging Multiple myeloma in remission ([place])  Staging form: Plasma Cell Myeloma and Plasma Cell Disorders, AJCC 8th Edition - Clinical stage from 03/21/2017: RISS Stage I (Beta-2-microglobulin (mg/L): 3.2, Albumin (g/dL): 3.6, ISS: Stage I,  High-risk cytogenetics: Absent, LDH: Normal) - Signed by [name], MD on 04/04/2017 - Clinical: No stage assigned - Unsigned   All questions were answered.  . The patient knows to call the clinic with any problems, questions or concerns.  This note was electronically signed.    History of Presenting Illness Robert Sparks is a 72 y.o. male followed in the [place] for new diagnosis of IgA lambda active multiple myeloma. At presentation, Mr. Stainback reported having some night sweats, but no weight loss. He indicated weakness in bilateral thighs associated with the feet and legs. Complained of constipation, but denies any numbness or tingling in hands and feet. Denied any new musculoskeletal pain. No chest pain, shortness of breath or cough. No nausea, vomiting, abdominal pain, diarrhea, or early satiety. Additional evaluation was conducted resulting in negative imaging including negative skeletal survey on PET/CT. Bone marrow biopsy returned positive with 7% involvement with atypical monoclonal plasma cells. In conjunction with kidney injury, anemia, dysproteinemia with significant elevation of lambda light chains and IgA, this confirmed a diagnosis of active multiple myeloma warranting intervention.   Patient returns to the clinic to initiate maintenance systemic chemotherapy with bortezomib.  In the interim, patient denies any new symptoms.  Continues to feel well and living active functional life.  Oncological/hematological History: --Labs, 10/28/15: WBC 6.0, Hgb 10.3, Plt 245;   --Labs, 10/02/16: WBC 5.1, Hgb 10.0, Plt 227; ANA -- negative --Labs, 11/10/16: tProt 6.3, Alb 3.4, Ca   9.1, Cr 2.56, AP 82; WBC 4.7, Hgb 8.1, Plt 201; Fe 43, FeSat 16%, TIBC 272, Ferritin 476, Vit B12 199, MMA 283; TSH 1.57 --Labs, 12/25/16: tProt 7.0, Alb 4.1, Ca   9.9, Cr 1.47, AP ...; SPEP -- M-spike 0.8g/dL; kappa 40.7, lambda 305.1, KLR 0.13; WBC 3.4, Hgb 13.2, Plt 154; --Labs, 02/09/17: tProt  8.0, Alb 3.9, Ca 10.1, Cr 1.80, AP 97; SPEP -- M-spike 0.7g.dL, IgA lambda; IgA 1220, IgG 666, IgM 52; kappa 33.5, lambda 433.5, KLR 0.08; WBC 6.3, Hgb 10.6, Plt 194 --Labs, 04/05/17: tProt 7.0, Alb 3.5, Ca   8.9, Cr 2.20, AP 75; SPEP -- M-spike 0.4g/dL, IgA lambda; IgA   433, IgG 683, IgM 52; kappa 59.7, lambda   42.7, KLR 1.40; WBC 5.0, Hgb   7.4, Plt 280;    Multiple myeloma in remission ([place])   02/16/2017 Imaging    Skeletal Survey: No definite lytic myelomatous lesions in the axial or appendicular skeleton      02/23/2017 Initial Diagnosis    Multiple myeloma not having achieved remission ([place])      03/09/2017 Imaging    PET-CT: No abnormal hypermetabolic activity or other specific findings of active myeloma. Several adjacent healing left lateral rib fractures.      03/13/2017 Pathology Results    Bone Marrow Bx: HYPERCELLULAR BONE MARROW FOR AGE WITH PLASMA CELL NEOPLASM. TRILINEAGE HEMATOPOIESIS. NORMOCYTIC-NORMOCHROMIA ANEMIA. EOSINOPHILIA. The bone marrow shows slight increase in atypical plasma cells representing 7% of all cells associated with small clusters in the clot and biopsy sections. Immunohistochemical stains highlight the plasma cell component in the bone marrow which shows lambda light chain restriction consistent with plasma cell neoplasm. The background shows trilineage hematopoiesis with non-specific myeloid changes. The peripheral blood shows eosinophilia without an associated increase in eosinophils in the bone marrow. The changes may  represent a hypersensitivity reaction. Cytogenetics/FISH: 46,XY -- Positive for +11, +17/17p+ -- extra CCND1 copy in 9% cells, gain of ATM copy in 9% & 11% cells with p53 gain.        03/16/2017 -  Chemotherapy    Induction chemotherapy: Lenalidomide 76m PO Qday, d1-14 + Bortezomib 1.374mm2 IV d1,4,8,11 + dexamethasone 2035mO QWk Q21d --Cycle #1, 03/15/29/16omplicated by grade 2 rash within 1 week of initiation, as well as a aggressive  rising creatinine; d11 held due to worsening rash --Cycle #2, 04/13/17: Initiated without lenalidomide due to persistent Grade 3 anemia --Cycle #3, 05/18/17: dosing changed to weekly bortezomib, cycle duration extended to 4 weeks --Cycle #4, 06/15/17: --Cycle #5, 07/13/17: --Cycle #6, 08/10/17:       06/08/2017 Pathology Results    BM Bx:  --Pathology: Plasma cells are down to 1% with polyclonal appearance. --CytoGen: Normal, 46,XY      06/15/2017 Tumor Marker    tProt 7.1, Alb 3.8, Ca 9.7, Cr 1.6, AP ... LDH 153, beta-2 microglobulin 3.3 SPEP -- No M-spike; SIFE -- normal IgG 688, IgA 105, IgM 47; kappa 50.7, lambda 19.3, KLR 2.63 WBC 3.6, ANC 2.1, Hgb 9.4, Plt 204      08/10/2017 Tumor Marker    tProt 7.0, Alb 3.9, Ca 9.5, Cr 2.3, AP 172 LDH 144, beta-2 microglobulin 3.6 SPEP -- pending IgG 771, IgA 123, IgM 55; kappa -- pending, lambda -- pending WBC 6.5, ANC 5.1, Hgb 9.4, Plt 228      08/29/2017 Remission    BM Bx: HYPERCELLULAR BONE MARROW FOR AGE WITH TRILINEAGE HEMATOPOIESIS AND 1% PLASMA CELLS. The bone marrow is hypercellular for age with trilineage hematopoiesis but with slight erythroid hyperplasia. There are nonspecific myeloid changes present likely related to previous therapy. The plasma cells represent 1% of all cells in the aspirate with lack of large aggregates and display polyclonal staining pattern for kappa and lambda light chains. Hence, there is no definite evidence of residual plasma cell neoplasm in this material.  --CytoGen: No clonal abnormalities --FISH: normal results      09/21/2017 -  Chemotherapy    Maintenance Bortezomib 1.3mg77m SQ Q14d        Medical History: Past Medical History:  Diagnosis Date  . Anemia   . Arthritis    shoulders,feet   . Cataract    per eye dr -has appt 5-11  . CKD (chronic kidney disease), stage III (HCC)[place]. Elevated PSA   . History of ketoacidosis    03-29-2015   . History of sepsis    07-25-2013   non-compliant w/ medication  . Hyperlipidemia   . Hypertension   . Nocturia   . Peripheral neuropathy   . Prostate cancer (HCC)[place]. [facility]  S[facility] History: Past Surgical History:  Procedure Laterality Date  . CIRCUMCISION  1990's  . PROSTATE BIOPSY N/A 12/21/2015   Procedure: BIOPSY TRANSRECTAL ULTRASONIC PROSTATE (TUBP);  Surgeon: MattFestus Aloe;  [facility];  Laterality: N/A;    Family History: Family History  Problem Relation Age of Onset  . Cancer Neg Hx   . Colon cancer Neg Hx   . Colon polyps Neg Hx   . Esophageal cancer Neg Hx   . Rectal cancer Neg Hx   . Stomach cancer Neg Hx     Social History: Social History   Socioeconomic History  . Marital status: Single  Spouse name: Not on file  . Number of children: Not on file  . Years of education: Not on file  . Highest education level: Not on file  Occupational History  . Not on file  Social Needs  . Financial resource strain: Not on file  . Food insecurity:    Worry: Not on file    Inability: Not on file  . Transportation needs:    Medical: Not on file    Non-medical: Not on file  Tobacco Use  . Smoking status: Current Every Day Smoker    Packs/day: 0.25    Years: 50.00    Pack years: 12.50    Types: Cigarettes  . Smokeless tobacco: Never Used  . Tobacco comment: 1 ppwk-4-5 a day   Substance and Sexual Activity  . Alcohol use: Yes    Alcohol/week: 0.6 oz    Types: 1 Cans of beer per week    Comment: 1 every day-quit couple weeks ago  . Drug use: No  . Sexual activity: Not Currently  Lifestyle  . Physical activity:    Days per week: Not on file    Minutes per session: Not on file  . Stress: Not on file  Relationships  . Social connections:    Talks on phone: Not on file    Gets together: Not on file    Attends religious service: Not on file    Active member of club or organization: Not on  file    Attends meetings of clubs or organizations: Not on file    Relationship status: Not on file  . Intimate partner violence:    Fear of current or ex partner: Not on file    Emotionally abused: Not on file    Physically abused: Not on file    Forced sexual activity: Not on file  Other Topics Concern  . Not on file  Social History Narrative  . Not on file    Allergies: No Known Allergies  Medications:  Current Outpatient Medications  Medication Sig Dispense Refill  . ACCU-CHEK AVIVA PLUS test strip 1 each by Other route See admin instructions. 100 each 12  . amLODipine (NORVASC) 5 MG tablet Take 1 tablet (5 mg total) by mouth daily. 90 tablet 3  . aspirin EC 81 MG tablet Take 1 tablet (81 mg total) by mouth daily. 90 tablet 3  . Cholecalciferol (VITAMIN D) 2000 units CAPS Take 2,000 Units by mouth daily.    [name] gabapentin (NEURONTIN) 300 MG capsule TAKE 1 CAPSULE BY MOUTH IN THE MORNING AND AT LUNCH, TAKE 2 CAPSULES BY MOUTH AT NIGHT. 120 capsule 4  . glucose blood test strip See admin instructions.    . insulin aspart protamine - aspart (NOVOLOG 70/30 MIX) (70-30) 100 UNIT/ML FlexPen Inject 0.09 mLs (9 Units total) into the skin 2 (two) times daily with a meal. 15 mL 6  . lidocaine-prilocaine (EMLA) cream Apply to affected area once 30 g 3  . mometasone (ELOCON) 0.1 % cream Apply 1 application topically daily. 45 g 3  . Multiple Vitamin (MULTIVITAMIN WITH MINERALS) TABS tablet Take 1 tablet by mouth daily. 60 tablet 2  . ondansetron (ZOFRAN) 8 MG tablet Take 1 tablet (8 mg total) by mouth 2 (two) times daily as needed (Nausea or vomiting). 30 tablet 1  . pravastatin (PRAVACHOL) 20 MG tablet Take 1 tablet (20 mg total) by mouth daily. 90 tablet 3  . prochlorperazine (COMPAZINE) 10 MG tablet Take 1 tablet (10 mg total)  by mouth every 6 (six) hours as needed (Nausea or vomiting). 30 tablet 1  . senna-docusate (SENOKOT-S) 8.6-50 MG tablet Take 2 tablets by mouth 2 (two) times daily.  60 tablet 2   Current Facility-Administered Medications  Medication Dose Route Frequency Provider Last Rate Last Dose  . 0.9 %  sodium chloride infusion  500 mL Intravenous Continuous Pyrtle, Lajuan Lines, MD        Review of Systems: Review of Systems  Constitutional: Positive for fatigue.  Eyes: Negative.   Cardiovascular: Negative.   Endocrine: Negative.   Genitourinary: Negative.    Hematological: Negative.   Psychiatric/Behavioral: Negative.   All other systems reviewed and are negative.    PHYSICAL EXAMINATION Blood pressure 137/82, pulse 80, temperature 98.9 F (37.2 C), temperature source Oral, resp. rate 18, height 5' 4" (1.626 m), weight 157 lb 4.8 oz (71.4 kg), SpO2 100 %.  ECOG PERFORMANCE STATUS: 2 - Symptomatic, <50% confined to bed  Physical Exam  Constitutional: He is oriented to person, place, and time and well-developed, well-nourished, and in no distress. No distress.  HENT:  Head: Normocephalic and atraumatic.  Mouth/Throat: Oropharynx is clear and moist. No oropharyngeal exudate.  Eyes: Pupils are equal, round, and reactive to light. Conjunctivae and EOM are normal. No scleral icterus.  Neck: No thyromegaly present.  Cardiovascular: Normal rate and regular rhythm.  No murmur heard. Pulmonary/Chest: Effort normal and breath sounds normal. No respiratory distress. He has no wheezes.  Abdominal: Soft. He exhibits no distension and no mass. There is no tenderness.  Musculoskeletal: He exhibits no edema or tenderness.  Lymphadenopathy:    He has no cervical adenopathy.  Neurological: He is alert and oriented to person, place, and time. He has normal reflexes. No cranial nerve deficit.  Skin: Skin is warm and dry. No rash noted. He is not diaphoretic. No erythema.     LABORATORY DATA: I have personally reviewed the data as listed: Appointment on 09/21/2017  Component Date Value Ref Range Status  . Sodium 09/21/2017 134* 136 - 145 mmol/L Final  . Potassium  09/21/2017 3.9  3.5 - 5.1 mmol/L Final  . Chloride 09/21/2017 103  98 - 109 mmol/L Final  . CO2 09/21/2017 20* 22 - 29 mmol/L Final  . Glucose, Bld 09/21/2017 164* 70 - 140 mg/dL Final  . BUN 09/21/2017 34* 7 - 26 mg/dL Final  . Creatinine 09/21/2017 1.65* 0.70 - 1.30 mg/dL Final  . Calcium 09/21/2017 9.5  8.4 - 10.4 mg/dL Final  . Total Protein 09/21/2017 6.6  6.4 - 8.3 g/dL Final  . Albumin 09/21/2017 3.7  3.5 - 5.0 g/dL Final  . AST 09/21/2017 16  5 - 34 U/L Final  . ALT 09/21/2017 14  0 - 55 U/L Final  . Alkaline Phosphatase 09/21/2017 90  40 - 150 U/L Final  . Total Bilirubin 09/21/2017 0.4  0.2 - 1.2 mg/dL Final  . GFR, Est Non Af Am 09/21/2017 41* >60 mL/min Final  . GFR, Est AFR Am 09/21/2017 47* >60 mL/min Final   Comment: (NOTE) The eGFR has been calculated using the CKD EPI equation. This calculation has not been validated in all clinical situations. eGFR's persistently <60 mL/min signify possible Chronic Kidney Disease.   Georgiann Hahn gap 09/21/2017 11  3 - 11 Final   Performed at [facility], [place] [address], [place], [place] [address]  . Magnesium 09/21/2017 3.5* 1.5 - 2.5 mg/dL Final   Performed at [facility],  [place] [address] [place], [place] [address]  . WBC Count 09/21/2017 7.5  4.0 - 10.3 K/uL Final  . RBC 09/21/2017 2.93* 4.20 - 5.82 MIL/uL Final  . Hemoglobin 09/21/2017 9.7* 13.0 - 17.1 g/dL Final  . HCT 09/21/2017 28.8* 38.4 - 49.9 % Final  . MCV 09/21/2017 98.3* 79.3 - 98.0 fL Final  . MCH 09/21/2017 33.1  27.2 - 33.4 pg Final  . MCHC 09/21/2017 33.7  32.0 - 36.0 g/dL Final  . RDW 09/21/2017 13.7  11.0 - 14.6 % Final  . Platelet Count 09/21/2017 191  140 - 400 K/uL Final  . Neutrophils Relative % 09/21/2017 69  % Final  . Neutro Abs 09/21/2017 5.2  1.5 - 6.5 K/uL Final  . Lymphocytes Relative 09/21/2017 14  % Final  . Lymphs Abs 09/21/2017 1.0  0.9 - 3.3 K/uL Final  . Monocytes Relative 09/21/2017 7  % Final   . Monocytes Absolute 09/21/2017 0.5  0.1 - 0.9 K/uL Final  . Eosinophils Relative 09/21/2017 10  % Final  . Eosinophils Absolute 09/21/2017 0.8* 0.0 - 0.5 K/uL Final  . Basophils Relative 09/21/2017 0  % Final  . Basophils Absolute 09/21/2017 0.0  0.0 - 0.1 K/uL Final   Performed at [facility], [place] [address], [place], [place] [address]       [name], MD

## 2017-10-01 DIAGNOSIS — N2581 Secondary hyperparathyroidism of renal origin: Secondary | ICD-10-CM | POA: Diagnosis not present

## 2017-10-01 DIAGNOSIS — N183 Chronic kidney disease, stage 3 (moderate): Secondary | ICD-10-CM | POA: Diagnosis not present

## 2017-10-05 ENCOUNTER — Inpatient Hospital Stay: Payer: Medicare HMO

## 2017-10-05 ENCOUNTER — Other Ambulatory Visit: Payer: Self-pay

## 2017-10-05 VITALS — BP 108/72 | HR 79 | Temp 98.5°F | Resp 18

## 2017-10-05 DIAGNOSIS — C9001 Multiple myeloma in remission: Secondary | ICD-10-CM | POA: Diagnosis not present

## 2017-10-05 DIAGNOSIS — Z5112 Encounter for antineoplastic immunotherapy: Secondary | ICD-10-CM | POA: Diagnosis not present

## 2017-10-05 LAB — CMP (CANCER CENTER ONLY)
ALT: 19 U/L (ref 0–55)
AST: 14 U/L (ref 5–34)
Albumin: 3.6 g/dL (ref 3.5–5.0)
Alkaline Phosphatase: 113 U/L (ref 40–150)
Anion gap: 8 (ref 3–11)
BUN: 32 mg/dL — AB (ref 7–26)
CHLORIDE: 109 mmol/L (ref 98–109)
CO2: 19 mmol/L — AB (ref 22–29)
CREATININE: 1.68 mg/dL — AB (ref 0.70–1.30)
Calcium: 9.3 mg/dL (ref 8.4–10.4)
GFR, Est AFR Am: 46 mL/min — ABNORMAL LOW (ref >60)
GFR, Estimated: 40 mL/min — ABNORMAL LOW (ref >60)
Glucose, Bld: 172 mg/dL — ABNORMAL HIGH (ref 70–140)
Potassium: 4 mmol/L (ref 3.5–5.1)
Sodium: 136 mmol/L (ref 136–145)
Total Bilirubin: 0.3 mg/dL (ref 0.2–1.2)
Total Protein: 6.6 g/dL (ref 6.4–8.3)

## 2017-10-05 LAB — CBC WITH DIFFERENTIAL (CANCER CENTER ONLY)
BASOS ABS: 0.1 10*3/uL (ref 0.0–0.1)
Basophils Relative: 1 %
EOS PCT: 26 %
Eosinophils Absolute: 1.4 10*3/uL — ABNORMAL HIGH (ref 0.0–0.5)
HCT: 30.4 % — ABNORMAL LOW (ref 38.4–49.9)
HEMOGLOBIN: 10.3 g/dL — AB (ref 13.0–17.1)
LYMPHS PCT: 13 %
Lymphs Abs: 0.7 10*3/uL — ABNORMAL LOW (ref 0.9–3.3)
MCH: 33.3 pg (ref 27.2–33.4)
MCHC: 34 g/dL (ref 32.0–36.0)
MCV: 98.1 fL — AB (ref 79.3–98.0)
Monocytes Absolute: 0.3 10*3/uL (ref 0.1–0.9)
Monocytes Relative: 6 %
NEUTROS ABS: 2.8 10*3/uL (ref 1.5–6.5)
NEUTROS PCT: 54 %
PLATELETS: 235 10*3/uL (ref 140–400)
RBC: 3.1 MIL/uL — AB (ref 4.20–5.82)
RDW: 14.2 % (ref 11.0–14.6)
WBC: 5.2 10*3/uL (ref 4.0–10.3)

## 2017-10-05 MED ORDER — PROCHLORPERAZINE MALEATE 10 MG PO TABS
ORAL_TABLET | ORAL | Status: AC
  Filled 2017-10-05: qty 1

## 2017-10-05 MED ORDER — PROCHLORPERAZINE MALEATE 10 MG PO TABS
10.0000 mg | ORAL_TABLET | Freq: Once | ORAL | Status: AC
  Administered 2017-10-05: 10 mg via ORAL

## 2017-10-05 MED ORDER — BORTEZOMIB CHEMO SQ INJECTION 3.5 MG (2.5MG/ML)
1.3000 mg/m2 | Freq: Once | INTRAMUSCULAR | Status: AC
  Administered 2017-10-05: 2.25 mg via SUBCUTANEOUS
  Filled 2017-10-05: qty 2.25

## 2017-10-05 NOTE — Progress Notes (Signed)
Creatinine 1.68. OK to proceed with treatment today, per Dr. Lebron Conners. Ana, Infusion RN made aware.

## 2017-10-05 NOTE — Patient Instructions (Signed)
[facility] Discharge Instructions for Patients Receiving Chemotherapy  Today you received the following chemotherapy agents Velcade.  To help prevent nausea and vomiting after your treatment, we encourage you to take your nausea medication as directed.  If you develop nausea and vomiting that is not controlled by your nausea medication, call the clinic.   BELOW ARE SYMPTOMS THAT SHOULD BE REPORTED IMMEDIATELY:  *FEVER GREATER THAN 100.5 F  *CHILLS WITH OR WITHOUT FEVER  NAUSEA AND VOMITING THAT IS NOT CONTROLLED WITH YOUR NAUSEA MEDICATION  *UNUSUAL SHORTNESS OF BREATH  *UNUSUAL BRUISING OR BLEEDING  TENDERNESS IN MOUTH AND THROAT WITH OR WITHOUT PRESENCE OF ULCERS  *URINARY PROBLEMS  *BOWEL PROBLEMS  UNUSUAL RASH Items with * indicate a potential emergency and should be followed up as soon as possible.  Feel free to call the clinic should you have any questions or concerns. The clinic phone number is [phone].  Please show the CHEMO ALERT CARD at check-in to the Emergency Department and triage nurse.   

## 2017-10-08 DIAGNOSIS — I129 Hypertensive chronic kidney disease with stage 1 through stage 4 chronic kidney disease, or unspecified chronic kidney disease: Secondary | ICD-10-CM | POA: Diagnosis not present

## 2017-10-08 DIAGNOSIS — Z720 Tobacco use: Secondary | ICD-10-CM | POA: Diagnosis not present

## 2017-10-08 DIAGNOSIS — C9000 Multiple myeloma not having achieved remission: Secondary | ICD-10-CM | POA: Diagnosis not present

## 2017-10-08 DIAGNOSIS — N183 Chronic kidney disease, stage 3 (moderate): Secondary | ICD-10-CM | POA: Diagnosis not present

## 2017-10-08 DIAGNOSIS — N2581 Secondary hyperparathyroidism of renal origin: Secondary | ICD-10-CM | POA: Diagnosis not present

## 2017-10-08 DIAGNOSIS — D631 Anemia in chronic kidney disease: Secondary | ICD-10-CM | POA: Diagnosis not present

## 2017-10-08 MED FILL — NOVOLOG MIX 70-30 FLEXPEN S: (70-30) 100 | 32 days supply | Qty: 6 | Fill #1

## 2017-10-15 ENCOUNTER — Telehealth: Payer: Self-pay | Admitting: Internal Medicine

## 2017-10-15 NOTE — Telephone Encounter (Signed)
7 page, paperwork received through  Fax 10-15-17.

## 2017-10-19 ENCOUNTER — Telehealth: Payer: Self-pay

## 2017-10-19 ENCOUNTER — Inpatient Hospital Stay: Payer: Medicare HMO | Attending: Hematology and Oncology | Admitting: Hematology and Oncology

## 2017-10-19 ENCOUNTER — Inpatient Hospital Stay: Payer: Medicare HMO

## 2017-10-19 ENCOUNTER — Encounter: Payer: Self-pay | Admitting: Hematology and Oncology

## 2017-10-19 VITALS — BP 116/62 | HR 79 | Temp 96.9°F | Resp 18 | Ht 64.0 in | Wt 155.8 lb

## 2017-10-19 DIAGNOSIS — C9001 Multiple myeloma in remission: Secondary | ICD-10-CM

## 2017-10-19 DIAGNOSIS — Z5112 Encounter for antineoplastic immunotherapy: Secondary | ICD-10-CM | POA: Insufficient documentation

## 2017-10-19 DIAGNOSIS — Z5111 Encounter for antineoplastic chemotherapy: Secondary | ICD-10-CM

## 2017-10-19 DIAGNOSIS — D649 Anemia, unspecified: Secondary | ICD-10-CM

## 2017-10-19 LAB — CMP (CANCER CENTER ONLY)
ALT: 23 U/L (ref 0–55)
AST: 20 U/L (ref 5–34)
Albumin: 3.5 g/dL (ref 3.5–5.0)
Alkaline Phosphatase: 63 U/L (ref 40–150)
Anion gap: 10 (ref 3–11)
BUN: 23 mg/dL (ref 7–26)
CO2: 24 mmol/L (ref 22–29)
Calcium: 9.6 mg/dL (ref 8.4–10.4)
Chloride: 106 mmol/L (ref 98–109)
Creatinine: 1.67 mg/dL — ABNORMAL HIGH (ref 0.70–1.30)
GFR, Est AFR Am: 47 mL/min — ABNORMAL LOW
GFR, Estimated: 40 mL/min — ABNORMAL LOW
Glucose, Bld: 139 mg/dL (ref 70–140)
Potassium: 3.4 mmol/L — ABNORMAL LOW (ref 3.5–5.1)
Sodium: 140 mmol/L (ref 136–145)
Total Bilirubin: 0.4 mg/dL (ref 0.2–1.2)
Total Protein: 6.4 g/dL (ref 6.4–8.3)

## 2017-10-19 LAB — CBC WITH DIFFERENTIAL (CANCER CENTER ONLY)
BASOS ABS: 0.1 10*3/uL (ref 0.0–0.1)
Basophils Relative: 1 %
Eosinophils Absolute: 1.8 10*3/uL — ABNORMAL HIGH (ref 0.0–0.5)
Eosinophils Relative: 32 %
HCT: 31 % — ABNORMAL LOW (ref 38.4–49.9)
Hemoglobin: 10.3 g/dL — ABNORMAL LOW (ref 13.0–17.1)
LYMPHS ABS: 0.9 10*3/uL (ref 0.9–3.3)
LYMPHS PCT: 16 %
MCH: 32.9 pg (ref 27.2–33.4)
MCHC: 33.2 g/dL (ref 32.0–36.0)
MCV: 99 fL — ABNORMAL HIGH (ref 79.3–98.0)
MONOS PCT: 5 %
Monocytes Absolute: 0.3 10*3/uL (ref 0.1–0.9)
NEUTROS ABS: 2.6 10*3/uL (ref 1.5–6.5)
Neutrophils Relative %: 46 %
PLATELETS: 171 10*3/uL (ref 140–400)
RBC: 3.13 MIL/uL — AB (ref 4.20–5.82)
RDW: 13.4 % (ref 11.0–14.6)
WBC Count: 5.7 10*3/uL (ref 4.0–10.3)

## 2017-10-19 MED ORDER — PROCHLORPERAZINE MALEATE 10 MG PO TABS
ORAL_TABLET | ORAL | Status: AC
  Filled 2017-10-19: qty 1

## 2017-10-19 MED ORDER — PROCHLORPERAZINE MALEATE 10 MG PO TABS
10.0000 mg | ORAL_TABLET | Freq: Once | ORAL | Status: AC
  Administered 2017-10-19: 10 mg via ORAL

## 2017-10-19 MED ORDER — BORTEZOMIB CHEMO SQ INJECTION 3.5 MG (2.5MG/ML)
1.3000 mg/m2 | Freq: Once | INTRAMUSCULAR | Status: AC
  Administered 2017-10-19: 2.25 mg via SUBCUTANEOUS
  Filled 2017-10-19: qty 2.25

## 2017-10-19 NOTE — Patient Instructions (Signed)
[facility] Discharge Instructions for Patients Receiving Chemotherapy  Today you received the following chemotherapy agents Velcade.  To help prevent nausea and vomiting after your treatment, we encourage you to take your nausea medication as directed.  If you develop nausea and vomiting that is not controlled by your nausea medication, call the clinic.   BELOW ARE SYMPTOMS THAT SHOULD BE REPORTED IMMEDIATELY:  *FEVER GREATER THAN 100.5 F  *CHILLS WITH OR WITHOUT FEVER  NAUSEA AND VOMITING THAT IS NOT CONTROLLED WITH YOUR NAUSEA MEDICATION  *UNUSUAL SHORTNESS OF BREATH  *UNUSUAL BRUISING OR BLEEDING  TENDERNESS IN MOUTH AND THROAT WITH OR WITHOUT PRESENCE OF ULCERS  *URINARY PROBLEMS  *BOWEL PROBLEMS  UNUSUAL RASH Items with * indicate a potential emergency and should be followed up as soon as possible.  Feel free to call the clinic should you have any questions or concerns. The clinic phone number is [phone].  Please show the CHEMO ALERT CARD at check-in to the Emergency Department and triage nurse.   

## 2017-10-19 NOTE — Telephone Encounter (Signed)
Printed avs and calender of upcoming appointment. Per 4/12 los [place]lov agreed to patient with labs 2 days before infusion due to vacation.

## 2017-10-19 NOTE — Progress Notes (Signed)
Ok to treat today with potassium 3.4 and creatinine at 1.67 per Dr. Lebron Conners.

## 2017-10-22 ENCOUNTER — Other Ambulatory Visit: Payer: Self-pay | Admitting: Hematology and Oncology

## 2017-10-22 DIAGNOSIS — C61 Malignant neoplasm of prostate: Secondary | ICD-10-CM | POA: Diagnosis not present

## 2017-10-22 DIAGNOSIS — N5201 Erectile dysfunction due to arterial insufficiency: Secondary | ICD-10-CM | POA: Diagnosis not present

## 2017-10-22 MED ORDER — LEUPROLIDE ACETATE (6 MONTH) 45 MG IM KIT: 45.0000 mg | PACK | Freq: Once | INTRAMUSCULAR | Status: DC

## 2017-10-23 MED FILL — PRAVASTATIN SODIUM 20 MG TA: 20 | 30 days supply | Qty: 30 | Fill #9

## 2017-10-23 MED FILL — ACCU-CHEK AVIVA PLUS TEST S: 30 days supply | Qty: 100 | Fill #2

## 2017-10-23 MED FILL — AMLODIPINE BESYLATE 5 MG TA: 5 | 30 days supply | Qty: 30 | Fill #9

## 2017-10-23 MED FILL — GABAPENTIN 300 MG CAPSULE: 300 | 30 days supply | Qty: 120 | Fill #1

## 2017-11-01 ENCOUNTER — Other Ambulatory Visit: Payer: Self-pay

## 2017-11-01 DIAGNOSIS — C9001 Multiple myeloma in remission: Secondary | ICD-10-CM

## 2017-11-02 ENCOUNTER — Inpatient Hospital Stay: Payer: Medicare HMO

## 2017-11-02 VITALS — BP 118/66 | HR 78 | Temp 98.6°F | Resp 16

## 2017-11-02 DIAGNOSIS — C9001 Multiple myeloma in remission: Secondary | ICD-10-CM

## 2017-11-02 DIAGNOSIS — D649 Anemia, unspecified: Secondary | ICD-10-CM | POA: Diagnosis not present

## 2017-11-02 DIAGNOSIS — Z5112 Encounter for antineoplastic immunotherapy: Secondary | ICD-10-CM | POA: Diagnosis not present

## 2017-11-02 LAB — CBC WITH DIFFERENTIAL (CANCER CENTER ONLY)
BASOS ABS: 0 10*3/uL (ref 0.0–0.1)
BASOS PCT: 0 %
EOS ABS: 1.5 10*3/uL — AB (ref 0.0–0.5)
Eosinophils Relative: 18 %
HEMATOCRIT: 28.6 % — AB (ref 38.4–49.9)
Hemoglobin: 9.6 g/dL — ABNORMAL LOW (ref 13.0–17.1)
Lymphocytes Relative: 7 %
Lymphs Abs: 0.6 10*3/uL — ABNORMAL LOW (ref 0.9–3.3)
MCH: 33.4 pg (ref 27.2–33.4)
MCHC: 33.6 g/dL (ref 32.0–36.0)
MCV: 99.2 fL — ABNORMAL HIGH (ref 79.3–98.0)
MONO ABS: 0.4 10*3/uL (ref 0.1–0.9)
MONOS PCT: 5 %
NEUTROS ABS: 5.8 10*3/uL (ref 1.5–6.5)
NEUTROS PCT: 70 %
Platelet Count: 195 10*3/uL (ref 140–400)
RBC: 2.88 MIL/uL — ABNORMAL LOW (ref 4.20–5.82)
RDW: 14.4 % (ref 11.0–14.6)
WBC Count: 8.3 10*3/uL (ref 4.0–10.3)

## 2017-11-02 LAB — COMPREHENSIVE METABOLIC PANEL
ALT: 11 U/L — AB (ref 17–63)
AST: 13 U/L — ABNORMAL LOW (ref 15–41)
Albumin: 3.5 g/dL (ref 3.5–5.0)
Alkaline Phosphatase: 59 U/L (ref 38–126)
Anion gap: 12 (ref 5–15)
BILIRUBIN TOTAL: 0.4 mg/dL (ref 0.3–1.2)
BUN: 28 mg/dL — AB (ref 6–20)
CO2: 22 mmol/L (ref 22–32)
CREATININE: 1.62 mg/dL — AB (ref 0.61–1.24)
Calcium: 9.3 mg/dL (ref 8.9–10.3)
Chloride: 105 mmol/L (ref 101–111)
GFR calc Af Amer: 48 mL/min — ABNORMAL LOW (ref >60)
GFR, EST NON AFRICAN AMERICAN: 42 mL/min — AB (ref >60)
Glucose, Bld: 122 mg/dL — ABNORMAL HIGH (ref 65–99)
Potassium: 3.7 mmol/L (ref 3.5–5.1)
Sodium: 139 mmol/L (ref 135–145)
Total Protein: 6.3 g/dL — ABNORMAL LOW (ref 6.5–8.1)

## 2017-11-02 LAB — MAGNESIUM: Magnesium: 2.4 mg/dL (ref 1.7–2.4)

## 2017-11-02 MED ORDER — PROCHLORPERAZINE MALEATE 10 MG PO TABS
10.0000 mg | ORAL_TABLET | Freq: Once | ORAL | Status: AC
  Administered 2017-11-02: 10 mg via ORAL

## 2017-11-02 MED ORDER — PROCHLORPERAZINE MALEATE 10 MG PO TABS
ORAL_TABLET | ORAL | Status: AC
  Filled 2017-11-02: qty 1

## 2017-11-02 MED ORDER — BORTEZOMIB CHEMO SQ INJECTION 3.5 MG (2.5MG/ML)
1.3000 mg/m2 | Freq: Once | INTRAMUSCULAR | Status: AC
  Administered 2017-11-02: 2.25 mg via SUBCUTANEOUS
  Filled 2017-11-02: qty 2.25

## 2017-11-02 NOTE — Progress Notes (Signed)
Received an okay to treat from Dr. Lebron Conners for Creatnine of 1.62.

## 2017-11-02 NOTE — Patient Instructions (Signed)
[facility] Discharge Instructions for Patients Receiving Chemotherapy  Today you received the following chemotherapy agents Velcade.  To help prevent nausea and vomiting after your treatment, we encourage you to take your nausea medication as directed.  If you develop nausea and vomiting that is not controlled by your nausea medication, call the clinic.   BELOW ARE SYMPTOMS THAT SHOULD BE REPORTED IMMEDIATELY:  *FEVER GREATER THAN 100.5 F  *CHILLS WITH OR WITHOUT FEVER  NAUSEA AND VOMITING THAT IS NOT CONTROLLED WITH YOUR NAUSEA MEDICATION  *UNUSUAL SHORTNESS OF BREATH  *UNUSUAL BRUISING OR BLEEDING  TENDERNESS IN MOUTH AND THROAT WITH OR WITHOUT PRESENCE OF ULCERS  *URINARY PROBLEMS  *BOWEL PROBLEMS  UNUSUAL RASH Items with * indicate a potential emergency and should be followed up as soon as possible.  Feel free to call the clinic should you have any questions or concerns. The clinic phone number is [phone].  Please show the CHEMO ALERT CARD at check-in to the Emergency Department and triage nurse.   

## 2017-11-07 ENCOUNTER — Ambulatory Visit (INDEPENDENT_AMBULATORY_CARE_PROVIDER_SITE_OTHER): Payer: Medicare HMO | Admitting: Podiatry

## 2017-11-07 DIAGNOSIS — M79676 Pain in unspecified toe(s): Secondary | ICD-10-CM

## 2017-11-07 DIAGNOSIS — B351 Tinea unguium: Secondary | ICD-10-CM | POA: Diagnosis not present

## 2017-11-07 DIAGNOSIS — L989 Disorder of the skin and subcutaneous tissue, unspecified: Secondary | ICD-10-CM | POA: Diagnosis not present

## 2017-11-07 DIAGNOSIS — E0842 Diabetes mellitus due to underlying condition with diabetic polyneuropathy: Secondary | ICD-10-CM | POA: Diagnosis not present

## 2017-11-07 NOTE — Assessment & Plan Note (Signed)
72 y.o. male with diagnosis of IgA lambda active multiple myeloma, ISS Stage I. Active disease diagnosed based on presence of anemia, kidney insufficiency, and paraproteinemia with significant predominance of lambda light chains as well as significant elevation of IgA. Bone marrow biopsy confirmed presence of atypical monoclonal plasma cell process in the bone marrow comprising at least 7% of the cellularity. Based on the findings, patient was started on treatment with lenalidomide, bortezomib, and low-dose dexamethasone based on the anticipated tolerance by the patient. Lenalidomide was started at 10 mg daily based on creatinine clearance of 42, dexamethasone dose was reduced to 20 mg weekly based on patient's age. Treatment was complicated by rapid development of cutaneous rash attributed to lenalidomide, inaddition to decreased renal function and now patient has persistent severe anemia. Side-effects were attributed to Lenalidomide and lenalidomide was discontinued with subsequent recovery.  Patient has been receiving bortezomib weekly with low-dose dexamethasone in 4-day cycles.  Patient has completed induction systemic therapy for his disease with repeat bone marrow biopsy confirming complete response including no evidence of minimal residual disease by cytogenetics or FISH.  Patient is recovering well from his therapy.  At this time, our options include proceeding to consolidative therapy with high-dose chemotherapy and autologous stem cell transplantation, initiation of maintenance therapy and observation.  Initially, when the treatment was started, [name] to perform as well as he did and to demonstrate remarkable recovery of the performance status that he has accomplished.  At this point in time, I would recommend him to proceed with at least evaluation by [facility] oncology for possible consolidative therapy.  At this time, patient is hesitant to proceed with that option  after some consideration, patient agreed to proceed with maintenance systemic therapy.  I have recommended bortezomib administered every other week for a total duration of 2 years.  No further zoledronic acid due to previous history of significant creatinine spikes every time the medication was used.   Clinical evaluation lab work today permissive to continue maintenance therapy unchanged.  No evidence of progressive neuropathy.  Plan: -Continue with maintenance bortezomib therapy  administered every other week.  Plan is to continue treatment for 2 years or until the disease progression. -Continue acyclovir at least for duration of bortezomib maintenance and likely with a 3 to 6 months overlap to reduce the chances of breakthrough activation of her presents from -Will conduct disease assessment with biochemical panel including SPEP, S IFE, serum free light chain assays every 3 months --next assessment prior to the next visit in the clinic. -Return to clinic in 1 month: Labs, clinic visit, bortezomib administration.

## 2017-11-07 NOTE — Progress Notes (Signed)
[name]:  Assessment: Multiple myeloma in remission [name] with diagnosis of IgA lambda active multiple myeloma, ISS Stage I. Active disease diagnosed based on presence of anemia, kidney insufficiency, and paraproteinemia with significant predominance of lambda light chains as well as significant elevation of IgA. Bone marrow biopsy confirmed presence of atypical monoclonal plasma cell process in the bone marrow comprising at least 7% of the cellularity. Based on the findings, patient was started on treatment with lenalidomide, bortezomib, and low-dose dexamethasone based on the anticipated tolerance by the patient. Lenalidomide was started at 10 mg daily based on creatinine clearance of 42, dexamethasone dose was reduced to 20 mg weekly based on patient's age. Treatment was complicated by rapid development of cutaneous rash attributed to lenalidomide, inaddition to decreased renal function and now patient has persistent severe anemia. Side-effects were attributed to Lenalidomide and lenalidomide was discontinued with subsequent recovery.  Patient has been receiving bortezomib weekly with low-dose dexamethasone in 4-day cycles.  Patient has completed induction systemic therapy for his disease with repeat bone marrow biopsy confirming complete response including no evidence of minimal residual disease by cytogenetics or FISH.  Patient is recovering well from his therapy.  At this time, our options include proceeding to consolidative therapy with high-dose chemotherapy and autologous stem cell transplantation, initiation of maintenance therapy and observation.  Initially, when the treatment was started, [name] to perform as well as he did and to demonstrate remarkable recovery of the performance status that he has accomplished.  At this point in time, I would recommend him to proceed with at least evaluation by [name] oncology  for possible consolidative therapy.  At this time, patient is hesitant to proceed with that option after some consideration, patient agreed to proceed with maintenance systemic therapy.  I have recommended bortezomib administered every other week for a total duration of 2 years.  No further zoledronic acid due to previous history of significant creatinine spikes every time the medication was used.   Clinical evaluation lab work today permissive to continue maintenance therapy unchanged.  No evidence of progressive neuropathy.  Plan: -Continue with maintenance bortezomib therapy  administered every other week.  Plan is to continue treatment for 2 years or until the disease progression. -Continue acyclovir at least for duration of bortezomib maintenance and likely with a 3 to 6 months overlap to reduce the chances of breakthrough activation of her presents from -Will conduct disease assessment with biochemical panel including SPEP, S IFE, serum free light chain assays every 3 months --next assessment prior to the next visit in the clinic. -Return to clinic in 1 month: Labs, clinic visit, bortezomib administration.  Voice recognition software was used and creation of this note. Despite my best effort at editing the text, some misspelling/errors may have occurred.  Orders Placed This Encounter  Procedures  . Beta 2 microglobulin, serum    Standing Status:   Future    Standing Expiration Date:   10/20/2018  . CBC with Differential (Cancer Center Only)    Standing Status:   Future    Standing Expiration Date:   10/20/2018  . CMP ([place] only)    Standing Status:   Future    Standing Expiration Date:   10/20/2018  . Kappa/lambda light chains    Standing Status:   Future    Standing Expiration Date:   10/20/2018  . Lactate dehydrogenase    Standing Status:  Future    Standing Expiration Date:   10/20/2018  . SPEP & IFE with QIG    Standing Status:   Future    Standing Expiration Date:    10/20/2018  . UPEP/UIFE/Light Chains/TP, 24-Hr Ur    Standing Status:   Future    Standing Expiration Date:   10/20/2018    Cancer Staging Multiple myeloma in remission [facility]) Staging form: Plasma Cell Myeloma and Plasma Cell Disorders, AJCC 8th Edition - Clinical stage from 03/21/2017: RISS Stage I (Beta-2-microglobulin (mg/L): 3.2, Albumin (g/dL): 3.6, ISS: Stage I, High-risk cytogenetics: Absent, LDH: Normal) - Signed by [name], MD on 04/04/2017 - Clinical: No stage assigned - Unsigned   All questions were answered.  . The patient knows to call the clinic with any problems, questions or concerns.  This note was electronically signed.    History of Presenting Illness Robert Sparks is a 72 y.o. male followed in the [place] for new diagnosis of IgA lambda active multiple myeloma. At presentation, Robert Sparks reported having some night sweats, but no weight loss. He indicated weakness in bilateral thighs associated with the feet and legs. Complained of constipation, but denies any numbness or tingling in hands and feet. Denied any new musculoskeletal pain. No chest pain, shortness of breath or cough. No nausea, vomiting, abdominal pain, diarrhea, or early satiety. Additional evaluation was conducted resulting in negative imaging including negative skeletal survey on PET/CT. Bone marrow biopsy returned positive with 7% involvement with atypical monoclonal plasma cells. In conjunction with kidney injury, anemia, dysproteinemia with significant elevation of lambda light chains and IgA, this confirmed a diagnosis of active multiple myeloma warranting intervention.   Patient returns to the clinic to continue maintenance systemic chemotherapy with bortezomib.  In the interim, patient denies any new symptoms.  Continues to feel well and living active functional life.  Oncological/hematological History: --Labs, 10/28/15: WBC 6.0, Hgb 10.3, Plt 245;   --Labs, 10/02/16: WBC 5.1, Hgb  10.0, Plt 227; ANA -- negative --Labs, 11/10/16: tProt 6.3, Alb 3.4, Ca   9.1, Cr 2.56, AP 82; WBC 4.7, Hgb 8.1, Plt 201; Fe 43, FeSat 16%, TIBC 272, Ferritin 476, Vit B12 199, MMA 283; TSH 1.57 --Labs, 12/25/16: tProt 7.0, Alb 4.1, Ca   9.9, Cr 1.47, AP ...; SPEP -- M-spike 0.8g/dL; kappa 40.7, lambda 305.1, KLR 0.13; WBC 3.4, Hgb 13.2, Plt 154; --Labs, 02/09/17: tProt 8.0, Alb 3.9, Ca 10.1, Cr 1.80, AP 97; SPEP -- M-spike 0.7g.dL, IgA lambda; IgA 1220, IgG 666, IgM 52; kappa 33.5, lambda 433.5, KLR 0.08; WBC 6.3, Hgb 10.6, Plt 194 --Labs, 04/05/17: tProt 7.0, Alb 3.5, Ca   8.9, Cr 2.20, AP 75; SPEP -- M-spike 0.4g/dL, IgA lambda; IgA   433, IgG 683, IgM 52; kappa 59.7, lambda   42.7, KLR 1.40; WBC 5.0, Hgb   7.4, Plt 280;    Multiple myeloma in remission ([place])   02/16/2017 Imaging    Skeletal Survey: No definite lytic myelomatous lesions in the axial or appendicular skeleton      02/23/2017 Initial Diagnosis    Multiple myeloma not having achieved remission ([place])      03/09/2017 Imaging    PET-CT: No abnormal hypermetabolic activity or other specific findings of active myeloma. Several adjacent healing left lateral rib fractures.      03/13/2017 Pathology Results    Bone Marrow Bx: HYPERCELLULAR BONE MARROW FOR AGE WITH PLASMA CELL NEOPLASM. TRILINEAGE HEMATOPOIESIS. NORMOCYTIC-NORMOCHROMIA ANEMIA. EOSINOPHILIA. The bone marrow shows slight increase in  atypical plasma cells representing 7% of all cells associated with small clusters in the clot and biopsy sections. Immunohistochemical stains highlight the plasma cell component in the bone marrow which shows lambda light chain restriction consistent with plasma cell neoplasm. The background shows trilineage hematopoiesis with non-specific myeloid changes. The peripheral blood shows eosinophilia without an associated increase in eosinophils in the bone marrow. The changes may represent a hypersensitivity reaction. Cytogenetics/FISH: 46,XY --  Positive for +11, +17/17p+ -- extra CCND1 copy in 9% cells, gain of ATM copy in 9% & 11% cells with p53 gain.        03/16/2017 -  Chemotherapy    Induction chemotherapy: Lenalidomide 34m PO Qday, d1-14 + Bortezomib 1.398mm2 IV d1,4,8,11 + dexamethasone 2080mO QWk Q21d --Cycle #1, 03/16/85/75omplicated by grade 2 rash within 1 week of initiation, as well as a aggressive rising creatinine; d11 held due to worsening rash --Cycle #2, 04/13/17: Initiated without lenalidomide due to persistent Grade 3 anemia --Cycle #3, 05/18/17: dosing changed to weekly bortezomib, cycle duration extended to 4 weeks --Cycle #4, 06/15/17: --Cycle #5, 07/13/17: --Cycle #6, 08/10/17:       06/08/2017 Pathology Results    BM Bx:  --Pathology: Plasma cells are down to 1% with polyclonal appearance. --CytoGen: Normal, 46,XY      06/15/2017 Tumor Marker    tProt 7.1, Alb 3.8, Ca 9.7, Cr 1.6, AP ... LDH 153, beta-2 microglobulin 3.3 SPEP -- No M-spike; SIFE -- normal IgG 688, IgA 105, IgM 47; kappa 50.7, lambda 19.3, KLR 2.63 WBC 3.6, ANC 2.1, Hgb 9.4, Plt 204      08/10/2017 Tumor Marker    tProt 7.0, Alb 3.9, Ca 9.5, Cr 2.3, AP 172 LDH 144, beta-2 microglobulin 3.6 SPEP -- pending IgG 771, IgA 123, IgM 55; kappa -- pending, lambda -- pending WBC 6.5, ANC 5.1, Hgb 9.4, Plt 228      08/29/2017 Remission    BM Bx: HYPERCELLULAR BONE MARROW FOR AGE WITH TRILINEAGE HEMATOPOIESIS AND 1% PLASMA CELLS. The bone marrow is hypercellular for age with trilineage hematopoiesis but with slight erythroid hyperplasia. There are nonspecific myeloid changes present likely related to previous therapy. The plasma cells represent 1% of all cells in the aspirate with lack of large aggregates and display polyclonal staining pattern for kappa and lambda light chains. Hence, there is no definite evidence of residual plasma cell neoplasm in this material.  --CytoGen: No clonal abnormalities --FISH: normal results      09/21/2017 -   Chemotherapy    Maintenance Bortezomib 1.3mg65m SQ Q14d        Medical History: Past Medical History:  Diagnosis Date  . Anemia   . Arthritis    shoulders,feet   . Cataract    per eye dr -has appt 5-11  . CKD (chronic kidney disease), stage III (HCC)[place]. Elevated PSA   . History of ketoacidosis    03-29-2015   . History of sepsis    07-25-2013  non-compliant w/ medication  . Hyperlipidemia   . Hypertension   . Nocturia   . Peripheral neuropathy   . Prostate cancer (HCC)[place]. [facility]  S[facility] History: Past Surgical History:  Procedure Laterality Date  . CIRCUMCISION  1990's  . PROSTATE BIOPSY N/A 12/21/2015   Procedure: BIOPSY TRANSRECTAL ULTRASONIC PROSTATE (TUBP);  Surgeon: MattFestus Aloe;  [facility];  Laterality: N/A;  Family History: Family History  Problem Relation Age of Onset  . Cancer Neg Hx   . Colon cancer Neg Hx   . Colon polyps Neg Hx   . Esophageal cancer Neg Hx   . Rectal cancer Neg Hx   . Stomach cancer Neg Hx     Social History: Social History   Socioeconomic History  . Marital status: Single    Spouse name: Not on file  . Number of children: Not on file  . Years of education: Not on file  . Highest education level: Not on file  Occupational History  . Not on file  Social Needs  . Financial resource strain: Not on file  . Food insecurity:    Worry: Not on file    Inability: Not on file  . Transportation needs:    Medical: Not on file    Non-medical: Not on file  Tobacco Use  . Smoking status: Current Every Day Smoker    Packs/day: 0.25    Years: 50.00    Pack years: 12.50    Types: Cigarettes  . Smokeless tobacco: Never Used  . Tobacco comment: 1 ppwk-4-5 a day   Substance and Sexual Activity  . Alcohol use: Yes    Alcohol/week: 0.6 oz    Types: 1 Cans of beer per week    Comment: 1 every day-quit couple weeks ago  . Drug  use: No  . Sexual activity: Not Currently  Lifestyle  . Physical activity:    Days per week: Not on file    Minutes per session: Not on file  . Stress: Not on file  Relationships  . Social connections:    Talks on phone: Not on file    Gets together: Not on file    Attends religious service: Not on file    Active member of club or organization: Not on file    Attends meetings of clubs or organizations: Not on file    Relationship status: Not on file  . Intimate partner violence:    Fear of current or ex partner: Not on file    Emotionally abused: Not on file    Physically abused: Not on file    Forced sexual activity: Not on file  Other Topics Concern  . Not on file  Social History Narrative  . Not on file    Allergies: No Known Allergies  Medications:  Current Outpatient Medications  Medication Sig Dispense Refill  . ACCU-CHEK AVIVA PLUS test strip 1 each by Other route See admin instructions. 100 each 12  . amLODipine (NORVASC) 5 MG tablet Take 1 tablet (5 mg total) by mouth daily. 90 tablet 3  . aspirin EC 81 MG tablet Take 1 tablet (81 mg total) by mouth daily. 90 tablet 3  . Cholecalciferol (VITAMIN D) 2000 units CAPS Take 2,000 Units by mouth daily.    [name] gabapentin (NEURONTIN) 300 MG capsule TAKE 1 CAPSULE BY MOUTH IN THE MORNING AND AT LUNCH, TAKE 2 CAPSULES BY MOUTH AT NIGHT. 120 capsule 4  . glucose blood test strip See admin instructions.    . insulin aspart protamine - aspart (NOVOLOG 70/30 MIX) (70-30) 100 UNIT/ML FlexPen Inject 0.09 mLs (9 Units total) into the skin 2 (two) times daily with a meal. 15 mL 6  . lidocaine-prilocaine (EMLA) cream Apply to affected area once 30 g 3  . mometasone (ELOCON) 0.1 % cream Apply 1 application topically daily. 45 g 3  . Multiple Vitamin (MULTIVITAMIN WITH MINERALS)  TABS tablet Take 1 tablet by mouth daily. 60 tablet 2  . ondansetron (ZOFRAN) 8 MG tablet Take 1 tablet (8 mg total) by mouth 2 (two) times daily as needed  (Nausea or vomiting). 30 tablet 1  . pravastatin (PRAVACHOL) 20 MG tablet Take 1 tablet (20 mg total) by mouth daily. 90 tablet 3  . prochlorperazine (COMPAZINE) 10 MG tablet Take 1 tablet (10 mg total) by mouth every 6 (six) hours as needed (Nausea or vomiting). 30 tablet 1  . senna-docusate (SENOKOT-S) 8.6-50 MG tablet Take 2 tablets by mouth 2 (two) times daily. 60 tablet 2   Current Facility-Administered Medications  Medication Dose Route Frequency Provider Last Rate Last Dose  . 0.9 %  sodium chloride infusion  500 mL Intravenous Continuous Pyrtle, Lajuan Lines, MD        Review of Systems: Review of Systems  Constitutional: Positive for fatigue.  Eyes: Negative.   Cardiovascular: Negative.   Endocrine: Negative.   Genitourinary: Negative.    Hematological: Negative.   Psychiatric/Behavioral: Negative.   All other systems reviewed and are negative.    PHYSICAL EXAMINATION Blood pressure 116/62, pulse 79, temperature (!) 96.9 F (36.1 C), temperature source Oral, resp. rate 18, height '5\' 4"'  (1.626 m), weight 155 lb 12.8 oz (70.7 kg), SpO2 100 %.  ECOG PERFORMANCE STATUS: 2 - Symptomatic, <50% confined to bed  Physical Exam  Constitutional: He is oriented to person, place, and time and well-developed, well-nourished, and in no distress. No distress.  HENT:  Head: Normocephalic and atraumatic.  Mouth/Throat: Oropharynx is clear and moist. No oropharyngeal exudate.  Eyes: Pupils are equal, round, and reactive to light. Conjunctivae and EOM are normal. No scleral icterus.  Neck: No thyromegaly present.  Cardiovascular: Normal rate and regular rhythm.  No murmur heard. Pulmonary/Chest: Effort normal and breath sounds normal. No respiratory distress. He has no wheezes.  Abdominal: Soft. He exhibits no distension and no mass. There is no tenderness.  Musculoskeletal: He exhibits no edema or tenderness.  Lymphadenopathy:    He has no cervical adenopathy.  Neurological: He is alert and  oriented to person, place, and time. He has normal reflexes. No cranial nerve deficit.  Skin: Skin is warm and dry. No rash noted. He is not diaphoretic. No erythema.     LABORATORY DATA: I have personally reviewed the data as listed: Appointment on 10/19/2017  Component Date Value Ref Range Status  . WBC Count 10/19/2017 5.7  4.0 - 10.3 K/uL Final  . RBC 10/19/2017 3.13* 4.20 - 5.82 MIL/uL Final  . Hemoglobin 10/19/2017 10.3* 13.0 - 17.1 g/dL Final  . HCT 10/19/2017 31.0* 38.4 - 49.9 % Final  . MCV 10/19/2017 99.0* 79.3 - 98.0 fL Final  . MCH 10/19/2017 32.9  27.2 - 33.4 pg Final  . MCHC 10/19/2017 33.2  32.0 - 36.0 g/dL Final  . RDW 10/19/2017 13.4  11.0 - 14.6 % Final  . Platelet Count 10/19/2017 171  140 - 400 K/uL Final  . Neutrophils Relative % 10/19/2017 46  % Final  . Lymphocytes Relative 10/19/2017 16  % Final  . Monocytes Relative 10/19/2017 5  % Final  . Eosinophils Relative 10/19/2017 32  % Final  . Basophils Relative 10/19/2017 1  % Final  . Neutro Abs 10/19/2017 2.6  1.5 - 6.5 K/uL Final  . Lymphs Abs 10/19/2017 0.9  0.9 - 3.3 K/uL Final  . Monocytes Absolute 10/19/2017 0.3  0.1 - 0.9 K/uL Final  . Eosinophils Absolute 10/19/2017 1.8*  0.0 - 0.5 K/uL Final  . Basophils Absolute 10/19/2017 0.1  0.0 - 0.1 K/uL Final   Performed at [facility], [place] [address], [place], [place] [address]  . Sodium 10/19/2017 140  136 - 145 mmol/L Final  . Potassium 10/19/2017 3.4* 3.5 - 5.1 mmol/L Final  . Chloride 10/19/2017 106  98 - 109 mmol/L Final  . CO2 10/19/2017 24  22 - 29 mmol/L Final  . Glucose, Bld 10/19/2017 139  70 - 140 mg/dL Final  . BUN 10/19/2017 23  7 - 26 mg/dL Final  . Creatinine 10/19/2017 1.67* 0.70 - 1.30 mg/dL Final  . Calcium 10/19/2017 9.6  8.4 - 10.4 mg/dL Final  . Total Protein 10/19/2017 6.4  6.4 - 8.3 g/dL Final  . Albumin 10/19/2017 3.5  3.5 - 5.0 g/dL Final  . AST 10/19/2017 20  5 - 34 U/L Final  . ALT 10/19/2017 23  0 - 55 U/L  Final  . Alkaline Phosphatase 10/19/2017 63  40 - 150 U/L Final  . Total Bilirubin 10/19/2017 0.4  0.2 - 1.2 mg/dL Final  . GFR, Est Non Af Am 10/19/2017 40* >60 mL/min Final  . GFR, Est AFR Am 10/19/2017 47* >60 mL/min Final   Comment: (NOTE) The eGFR has been calculated using the CKD EPI equation. This calculation has not been validated in all clinical situations. eGFR's persistently <60 mL/min signify possible Chronic Kidney Disease.   Georgiann Hahn gap 10/19/2017 10  3 - 11 Final   Performed at [facility], [place] [address] [place], [place] [address]       [name], MD

## 2017-11-12 ENCOUNTER — Other Ambulatory Visit: Payer: Self-pay | Admitting: Hematology and Oncology

## 2017-11-12 DIAGNOSIS — C9000 Multiple myeloma not having achieved remission: Secondary | ICD-10-CM

## 2017-11-12 MED FILL — NOVOLOG MIX 70-30 FLEXPEN S: (70-30) 100 | 32 days supply | Qty: 6 | Fill #2

## 2017-11-12 MED FILL — ACYCLOVIR 400 MG TABLET: 400 | 30 days supply | Qty: 60 | Fill #0

## 2017-11-12 NOTE — Progress Notes (Signed)
    Subjective: Patient is a 72 y.o. male with PMHx of T2DM presenting to the office today for follow up evaluation of two painful callus lesions to the bilateral feet that have been present for several months. Walking and bearing weight increases the pain. Resting the feet and being nonweightbearing helps alleviate it.   Patient also complains of elongated, thickened nails that cause pain while ambulating in shoes. He is unable to trim his own nails. Patient presents today for further treatment and evaluation.   Past Medical History:  Diagnosis Date  . Anemia   . Arthritis    shoulders,feet   . Cataract    per eye dr -has appt 5-11  . CKD (chronic kidney disease), stage III ([place])   . Elevated PSA   . History of ketoacidosis    03-29-2015   . History of sepsis    07-25-2013  non-compliant w/ medication  . Hyperlipidemia   . Hypertension   . Nocturia   . Peripheral neuropathy   . Prostate cancer ([place])   . Type 2 diabetes mellitus with insulin therapy (HCC)     Objective:  Physical Exam General: Alert and oriented x3 in no acute distress  Dermatology: Hyperkeratotic lesions present on the bilateral feet x 2. Pain on palpation with a central nucleated core noted. Skin is warm, dry and supple bilateral lower extremities. Negative for open lesions or macerations. Nails are tender, long, thickened and dystrophic with subungual debris, consistent with onychomycosis, 1-5 bilateral. No signs of infection noted.  Vascular: Palpable pedal pulses bilaterally. No edema or erythema noted. Capillary refill within normal limits.  Neurological: Epicritic and protective threshold diminished bilaterally.   Musculoskeletal Exam: Pain on palpation at the keratotic lesion noted. Range of motion within normal limits bilateral. Muscle strength 5/5 in all groups bilateral.  Assessment: 1. Onychodystrophic nails 1-5 bilateral with hyperkeratosis of nails.  2. Onychomycosis of nail due to dermatophyte  bilateral 3. Pre-ulcerative callus lesions to the bilateral feet x 2   Plan of Care:  #1 Patient evaluated. #2 Excisional debridement of keratoic lesion using a chisel blade was performed without incident.  #3 Dressed with light dressing. #4 Mechanical debridement of nails 1-5 bilaterally performed using a nail nipper. Filed with dremel without incident.  #5 Prescription for pain cream to be dispensed by [facility] provided.  #6 Patient is to return to the clinic in 3 months.   Edrick Kins, DPM [facility]  Dr. Edrick Kins, [address]                                        [place], [place] [address]                Office [phone]  Fax [phone]

## 2017-11-14 ENCOUNTER — Inpatient Hospital Stay: Payer: Medicare HMO

## 2017-11-14 ENCOUNTER — Inpatient Hospital Stay: Payer: Medicare HMO | Attending: Hematology and Oncology | Admitting: Hematology and Oncology

## 2017-11-14 ENCOUNTER — Encounter: Payer: Self-pay | Admitting: Hematology and Oncology

## 2017-11-14 ENCOUNTER — Telehealth: Payer: Self-pay | Admitting: Hematology and Oncology

## 2017-11-14 VITALS — BP 117/76 | HR 86 | Temp 98.5°F | Resp 18 | Ht 64.0 in | Wt 156.9 lb

## 2017-11-14 DIAGNOSIS — C9001 Multiple myeloma in remission: Secondary | ICD-10-CM

## 2017-11-14 DIAGNOSIS — Z5111 Encounter for antineoplastic chemotherapy: Secondary | ICD-10-CM

## 2017-11-14 DIAGNOSIS — Z5112 Encounter for antineoplastic immunotherapy: Secondary | ICD-10-CM | POA: Insufficient documentation

## 2017-11-14 LAB — CMP (CANCER CENTER ONLY)
ALT: 13 U/L (ref 0–55)
ANION GAP: 8 (ref 3–11)
AST: 15 U/L (ref 5–34)
Albumin: 3.8 g/dL (ref 3.5–5.0)
Alkaline Phosphatase: 112 U/L (ref 40–150)
BUN: 22 mg/dL (ref 7–26)
CHLORIDE: 108 mmol/L (ref 98–109)
CO2: 25 mmol/L (ref 22–29)
Calcium: 9.5 mg/dL (ref 8.4–10.4)
Creatinine: 1.58 mg/dL — ABNORMAL HIGH (ref 0.70–1.30)
GFR, EST AFRICAN AMERICAN: 50 mL/min — AB (ref >60)
GFR, Estimated: 43 mL/min — ABNORMAL LOW (ref >60)
Glucose, Bld: 134 mg/dL (ref 70–140)
Potassium: 3.5 mmol/L (ref 3.5–5.1)
SODIUM: 141 mmol/L (ref 136–145)
Total Bilirubin: 0.4 mg/dL (ref 0.2–1.2)
Total Protein: 6.8 g/dL (ref 6.4–8.3)

## 2017-11-14 LAB — CBC WITH DIFFERENTIAL (CANCER CENTER ONLY)
BASOS ABS: 0 10*3/uL (ref 0.0–0.1)
Basophils Relative: 0 %
Eosinophils Absolute: 1.4 10*3/uL — ABNORMAL HIGH (ref 0.0–0.5)
Eosinophils Relative: 23 %
HEMATOCRIT: 29.8 % — AB (ref 38.4–49.9)
Hemoglobin: 9.8 g/dL — ABNORMAL LOW (ref 13.0–17.1)
LYMPHS ABS: 1 10*3/uL (ref 0.9–3.3)
LYMPHS PCT: 16 %
MCH: 33 pg (ref 27.2–33.4)
MCHC: 32.9 g/dL (ref 32.0–36.0)
MCV: 100.3 fL — AB (ref 79.3–98.0)
MONO ABS: 0.3 10*3/uL (ref 0.1–0.9)
MONOS PCT: 5 %
NEUTROS ABS: 3.6 10*3/uL (ref 1.5–6.5)
Neutrophils Relative %: 56 %
Platelet Count: 217 10*3/uL (ref 140–400)
RBC: 2.97 MIL/uL — ABNORMAL LOW (ref 4.20–5.82)
RDW: 13.8 % (ref 11.0–14.6)
WBC Count: 6.3 10*3/uL (ref 4.0–10.3)

## 2017-11-14 LAB — VITAMIN B12: VITAMIN B 12: 1524 pg/mL — AB (ref 180–914)

## 2017-11-14 LAB — LACTATE DEHYDROGENASE: LDH: 161 U/L (ref 125–245)

## 2017-11-14 LAB — FOLATE: Folate: 10.6 ng/mL (ref >5.9)

## 2017-11-14 NOTE — Telephone Encounter (Signed)
Scheduled appt per 5/8 los - gave patient AVS And calender per los.

## 2017-11-15 LAB — KAPPA/LAMBDA LIGHT CHAINS
KAPPA FREE LGHT CHN: 50.8 mg/L — AB (ref 3.3–19.4)
KAPPA, LAMDA LIGHT CHAIN RATIO: 2.72 — AB (ref 0.26–1.65)
Lambda free light chains: 18.7 mg/L (ref 5.7–26.3)

## 2017-11-15 LAB — BETA 2 MICROGLOBULIN, SERUM: BETA 2 MICROGLOBULIN: 2.3 mg/L (ref 0.6–2.4)

## 2017-11-16 ENCOUNTER — Inpatient Hospital Stay: Payer: Medicare HMO

## 2017-11-16 ENCOUNTER — Other Ambulatory Visit: Payer: Self-pay | Admitting: *Deleted

## 2017-11-16 VITALS — BP 123/67 | HR 86 | Temp 98.4°F | Resp 18

## 2017-11-16 DIAGNOSIS — C9001 Multiple myeloma in remission: Secondary | ICD-10-CM

## 2017-11-16 DIAGNOSIS — Z5112 Encounter for antineoplastic immunotherapy: Secondary | ICD-10-CM | POA: Diagnosis not present

## 2017-11-16 LAB — METHYLMALONIC ACID, SERUM: METHYLMALONIC ACID, QUANTITATIVE: 232 nmol/L (ref 0–378)

## 2017-11-16 MED ORDER — PROCHLORPERAZINE MALEATE 10 MG PO TABS
ORAL_TABLET | ORAL | Status: AC
  Filled 2017-11-16: qty 1

## 2017-11-16 MED ORDER — BORTEZOMIB CHEMO SQ INJECTION 3.5 MG (2.5MG/ML)
1.3000 mg/m2 | Freq: Once | INTRAMUSCULAR | Status: AC
  Administered 2017-11-16: 2.25 mg via SUBCUTANEOUS
  Filled 2017-11-16: qty 2.25

## 2017-11-16 MED ORDER — PROCHLORPERAZINE MALEATE 10 MG PO TABS
10.0000 mg | ORAL_TABLET | Freq: Once | ORAL | Status: AC
  Administered 2017-11-16: 10 mg via ORAL

## 2017-11-16 NOTE — Progress Notes (Signed)
Okay to treat today with creatinine of 1.58, per Dr. Burr Medico.

## 2017-11-16 NOTE — Patient Instructions (Signed)
[facility] Discharge Instructions for Patients Receiving Chemotherapy  Today you received the following chemotherapy agents Velcade.  To help prevent nausea and vomiting after your treatment, we encourage you to take your nausea medication as directed.  If you develop nausea and vomiting that is not controlled by your nausea medication, call the clinic.   BELOW ARE SYMPTOMS THAT SHOULD BE REPORTED IMMEDIATELY:  *FEVER GREATER THAN 100.5 F  *CHILLS WITH OR WITHOUT FEVER  NAUSEA AND VOMITING THAT IS NOT CONTROLLED WITH YOUR NAUSEA MEDICATION  *UNUSUAL SHORTNESS OF BREATH  *UNUSUAL BRUISING OR BLEEDING  TENDERNESS IN MOUTH AND THROAT WITH OR WITHOUT PRESENCE OF ULCERS  *URINARY PROBLEMS  *BOWEL PROBLEMS  UNUSUAL RASH Items with * indicate a potential emergency and should be followed up as soon as possible.  Feel free to call the clinic should you have any questions or concerns. The clinic phone number is [phone].  Please show the CHEMO ALERT CARD at check-in to the Emergency Department and triage nurse.   

## 2017-11-19 LAB — MULTIPLE MYELOMA PANEL, SERUM
ALBUMIN/GLOB SERPL: 1.4 (ref 0.7–1.7)
Albumin SerPl Elph-Mcnc: 3.8 g/dL (ref 2.9–4.4)
Alpha 1: 0.2 g/dL (ref 0.0–0.4)
Alpha2 Glob SerPl Elph-Mcnc: 0.9 g/dL (ref 0.4–1.0)
B-Globulin SerPl Elph-Mcnc: 0.9 g/dL (ref 0.7–1.3)
Gamma Glob SerPl Elph-Mcnc: 0.8 g/dL (ref 0.4–1.8)
Globulin, Total: 2.9 g/dL (ref 2.2–3.9)
IGA: 123 mg/dL (ref 61–437)
IGM (IMMUNOGLOBULIN M), SRM: 66 mg/dL (ref 20–172)
IgG (Immunoglobin G), Serum: 804 mg/dL (ref 700–1600)
Total Protein ELP: 6.7 g/dL (ref 6.0–8.5)

## 2017-11-20 DIAGNOSIS — H10413 Chronic giant papillary conjunctivitis, bilateral: Secondary | ICD-10-CM | POA: Diagnosis not present

## 2017-11-20 DIAGNOSIS — H25813 Combined forms of age-related cataract, bilateral: Secondary | ICD-10-CM | POA: Diagnosis not present

## 2017-11-20 DIAGNOSIS — E119 Type 2 diabetes mellitus without complications: Secondary | ICD-10-CM | POA: Diagnosis not present

## 2017-11-20 LAB — UPEP/UIFE/LIGHT CHAINS/TP, 24-HR UR
% BETA, Urine: 24.4 %
ALPHA 1 URINE: 4.8 %
ALPHA 2 UR: 13.7 %
Albumin, U: 41.1 %
FREE LAMBDA LT CHAINS, UR: 9.59 mg/L — AB (ref 0.24–6.66)
Free Kappa Lt Chains,Ur: 201 mg/L — ABNORMAL HIGH (ref 1.35–24.19)
Free Kappa/Lambda Ratio: 20.96 — ABNORMAL HIGH (ref 2.04–10.37)
GAMMA GLOBULIN URINE: 16.1 %
TOTAL PROTEIN, URINE-UR/DAY: 272 mg/(24.h) — AB (ref 30–150)
Total Protein, Urine: 14.7 mg/dL
Total Volume: 1850

## 2017-11-20 LAB — HM DIABETES EYE EXAM

## 2017-11-27 DIAGNOSIS — M65341 Trigger finger, right ring finger: Secondary | ICD-10-CM | POA: Diagnosis not present

## 2017-11-27 DIAGNOSIS — M65342 Trigger finger, left ring finger: Secondary | ICD-10-CM | POA: Diagnosis not present

## 2017-11-27 DIAGNOSIS — M65351 Trigger finger, right little finger: Secondary | ICD-10-CM | POA: Diagnosis not present

## 2017-11-30 ENCOUNTER — Inpatient Hospital Stay: Payer: Medicare HMO

## 2017-11-30 VITALS — BP 129/70 | HR 74 | Temp 98.1°F | Resp 18

## 2017-11-30 DIAGNOSIS — C9001 Multiple myeloma in remission: Secondary | ICD-10-CM | POA: Diagnosis not present

## 2017-11-30 DIAGNOSIS — Z5112 Encounter for antineoplastic immunotherapy: Secondary | ICD-10-CM | POA: Diagnosis not present

## 2017-11-30 MED ORDER — PROCHLORPERAZINE MALEATE 10 MG PO TABS
10.0000 mg | ORAL_TABLET | Freq: Once | ORAL | Status: AC
  Administered 2017-11-30: 10 mg via ORAL

## 2017-11-30 MED ORDER — PROCHLORPERAZINE MALEATE 10 MG PO TABS
ORAL_TABLET | ORAL | Status: AC
Start: 2017-11-30 — End: ?
  Filled 2017-11-30: qty 1

## 2017-11-30 MED ORDER — BORTEZOMIB CHEMO SQ INJECTION 3.5 MG (2.5MG/ML)
1.3000 mg/m2 | Freq: Once | INTRAMUSCULAR | Status: AC
  Administered 2017-11-30: 2.25 mg via SUBCUTANEOUS
  Filled 2017-11-30: qty 2.25

## 2017-11-30 NOTE — Patient Instructions (Signed)
[facility] Discharge Instructions for Patients Receiving Chemotherapy  Today you received the following chemotherapy agents Velcade.  To help prevent nausea and vomiting after your treatment, we encourage you to take your nausea medication as directed.  If you develop nausea and vomiting that is not controlled by your nausea medication, call the clinic.   BELOW ARE SYMPTOMS THAT SHOULD BE REPORTED IMMEDIATELY:  *FEVER GREATER THAN 100.5 F  *CHILLS WITH OR WITHOUT FEVER  NAUSEA AND VOMITING THAT IS NOT CONTROLLED WITH YOUR NAUSEA MEDICATION  *UNUSUAL SHORTNESS OF BREATH  *UNUSUAL BRUISING OR BLEEDING  TENDERNESS IN MOUTH AND THROAT WITH OR WITHOUT PRESENCE OF ULCERS  *URINARY PROBLEMS  *BOWEL PROBLEMS  UNUSUAL RASH Items with * indicate a potential emergency and should be followed up as soon as possible.  Feel free to call the clinic should you have any questions or concerns. The clinic phone number is [phone].  Please show the CHEMO ALERT CARD at check-in to the Emergency Department and triage nurse.   

## 2017-11-30 NOTE — Progress Notes (Signed)
Per Dr. Lebron Conners okay to treat pt with abnormal labs from 11/14/17

## 2017-12-04 MED FILL — GABAPENTIN 300 MG CAPSULE: 300 | 30 days supply | Qty: 120 | Fill #2

## 2017-12-04 MED FILL — ACCU-CHEK AVIVA PLUS TEST S: 30 days supply | Qty: 100 | Fill #3

## 2017-12-04 NOTE — Progress Notes (Signed)
[name]:  Assessment: Multiple myeloma in remission [name]) 72 y.o. male with diagnosis of IgA lambda active multiple myeloma, ISS Stage I. Active disease diagnosed based on presence of anemia, kidney insufficiency, and paraproteinemia with significant predominance of lambda light chains as well as significant elevation of IgA. Bone marrow biopsy confirmed presence of atypical monoclonal plasma cell process in the bone marrow comprising at least 7% of the cellularity. Based on the findings, patient was started on treatment with lenalidomide, bortezomib, and low-dose dexamethasone based on the anticipated tolerance by the patient. Lenalidomide was started at 10 mg daily based on creatinine clearance of 42, dexamethasone dose was reduced to 20 mg weekly based on patient's age. Treatment was complicated by rapid development of cutaneous rash attributed to lenalidomide, inaddition to decreased renal function and now patient has persistent severe anemia. Side-effects were attributed to Lenalidomide and lenalidomide was discontinued with subsequent recovery.  Patient has been receiving bortezomib weekly with low-dose dexamethasone in 4-day cycles.  Patient has completed induction systemic therapy for his disease with repeat bone marrow biopsy confirming complete response including no evidence of minimal residual disease by cytogenetics or FISH.  Patient is recovering well from his therapy.  At this time, our options include proceeding to consolidative therapy with high-dose chemotherapy and autologous stem cell transplantation, initiation of maintenance therapy and observation.  Initially, when the treatment was started, [name] to perform as well as he did and to demonstrate remarkable recovery of the performance status that he has accomplished.  At this point in time, I would recommend him to proceed with at least evaluation by [name] oncology  for possible consolidative therapy.  At this time, patient is hesitant to proceed with that option after some consideration, patient agreed to proceed with maintenance systemic therapy.  I have recommended bortezomib administered every other week for a total duration of 2 years.  No further zoledronic acid due to previous history of significant creatinine spikes every time the medication was used.   Clinical evaluation lab work today permissive to continue maintenance therapy unchanged.  No evidence of progressive neuropathy.  Plan: -Continue with maintenance bortezomib therapy  administered every other week.  Plan is to continue treatment for 2 years or until the disease progression. -Continue acyclovir at least for duration of bortezomib maintenance and likely with a 3 to 6 months overlap to reduce the chances of breakthrough activation of varicella-zoster -Will conduct disease assessment with biochemical panel including SPEP, S IFE, serum free light chain assays every 3 months -Return to clinic in 1 month: Labs, clinic visit, bortezomib administration.  Voice recognition software was used and creation of this note. Despite my best effort at editing the text, some misspelling/errors may have occurred.  Orders Placed This Encounter  Procedures  . Multiple Myeloma Panel (SPEP&IFE w/QIG)    Standing Status:   Future    Number of Occurrences:   1    Standing Expiration Date:   11/14/2018  . Folate, Serum    Standing Status:   Future    Number of Occurrences:   1    Standing Expiration Date:   11/14/2018  . Vitamin B12    Standing Status:   Future    Number of Occurrences:   1    Standing Expiration Date:   11/14/2018  . Methylmalonic acid, serum    Standing Status:   Future    Number of Occurrences:   1  Standing Expiration Date:   11/14/2018  . CBC with Differential (Cancer Center Only)    Standing Status:   Future    Standing Expiration Date:   11/15/2018  . CMP ([place] only)     Standing Status:   Future    Standing Expiration Date:   11/15/2018  . Lactate dehydrogenase (LDH)    Standing Status:   Future    Standing Expiration Date:   11/14/2018  . Beta 2 microglobulin, serum    Standing Status:   Future    Standing Expiration Date:   11/15/2018    Cancer Staging Multiple myeloma in remission [facility]) Staging form: Plasma Cell Myeloma and Plasma Cell Disorders, AJCC 8th Edition - Clinical stage from 03/21/2017: RISS Stage I (Beta-2-microglobulin (mg/L): 3.2, Albumin (g/dL): 3.6, ISS: Stage I, High-risk cytogenetics: Absent, LDH: Normal) - Signed by Robert Sax, MD on 04/04/2017 - Clinical: No stage assigned - Unsigned   All questions were answered.  . The patient knows to call the clinic with any problems, questions or concerns.  This note was electronically signed.    History of Presenting Illness Robert Sparks is a 72 y.o. male followed in the [place] for new diagnosis of IgA lambda active multiple myeloma. At presentation, Robert Sparks reported having some night sweats, but no weight loss. He indicated weakness in bilateral thighs associated with the feet and legs. Complained of constipation, but denies any numbness or tingling in hands and feet. Denied any new musculoskeletal pain. No chest pain, shortness of breath or cough. No nausea, vomiting, abdominal pain, diarrhea, or early satiety. Additional evaluation was conducted resulting in negative imaging including negative skeletal survey on PET/CT. Bone marrow biopsy returned positive with 7% involvement with atypical monoclonal plasma cells. In conjunction with kidney injury, anemia, dysproteinemia with significant elevation of lambda light chains and IgA, this confirmed a diagnosis of active multiple myeloma warranting intervention.   Patient returns to the clinic to continue maintenance systemic chemotherapy with bortezomib.  In the interim, patient denies any new symptoms.  Continues to feel well and  living active functional life.  The only active complaint is intermittent dizziness without any interval falling.  Oncological/hematological History: --Labs, 10/28/15: WBC 6.0, Hgb 10.3, Plt 245;   --Labs, 10/02/16: WBC 5.1, Hgb 10.0, Plt 227; ANA -- negative --Labs, 11/10/16: tProt 6.3, Alb 3.4, Ca   9.1, Cr 2.56, AP 82; WBC 4.7, Hgb 8.1, Plt 201; Fe 43, FeSat 16%, TIBC 272, Ferritin 476, Vit B12 199, MMA 283; TSH 1.57 --Labs, 12/25/16: tProt 7.0, Alb 4.1, Ca   9.9, Cr 1.47, AP ...; SPEP -- M-spike 0.8g/dL; kappa 40.7, lambda 305.1, KLR 0.13; WBC 3.4, Hgb 13.2, Plt 154; --Labs, 02/09/17: tProt 8.0, Alb 3.9, Ca 10.1, Cr 1.80, AP 97; SPEP -- M-spike 0.7g.dL, IgA lambda; IgA 1220, IgG 666, IgM 52; kappa 33.5, lambda 433.5, KLR 0.08; WBC 6.3, Hgb 10.6, Plt 194 --Labs, 04/05/17: tProt 7.0, Alb 3.5, Ca   8.9, Cr 2.20, AP 75; SPEP -- M-spike 0.4g/dL, IgA lambda; IgA   433, IgG 683, IgM 52; kappa 59.7, lambda   42.7, KLR 1.40; WBC 5.0, Hgb   7.4, Plt 280;    Multiple myeloma in remission ([place])   02/16/2017 Imaging    Skeletal Survey: No definite lytic myelomatous lesions in the axial or appendicular skeleton      02/23/2017 Initial Diagnosis    Multiple myeloma not having achieved remission ([place])      03/09/2017 Imaging  PET-CT: No abnormal hypermetabolic activity or other specific findings of active myeloma. Several adjacent healing left lateral rib fractures.      03/13/2017 Pathology Results    Bone Marrow Bx: HYPERCELLULAR BONE MARROW FOR AGE WITH PLASMA CELL NEOPLASM. TRILINEAGE HEMATOPOIESIS. NORMOCYTIC-NORMOCHROMIA ANEMIA. EOSINOPHILIA. The bone marrow shows slight increase in atypical plasma cells representing 7% of all cells associated with small clusters in the clot and biopsy sections. Immunohistochemical stains highlight the plasma cell component in the bone marrow which shows lambda light chain restriction consistent with plasma cell neoplasm. The background shows trilineage  hematopoiesis with non-specific myeloid changes. The peripheral blood shows eosinophilia without an associated increase in eosinophils in the bone marrow. The changes may represent a hypersensitivity reaction. Cytogenetics/FISH: 46,XY -- Positive for +11, +17/17p+ -- extra CCND1 copy in 9% cells, gain of ATM copy in 9% & 11% cells with p53 gain.        03/16/2017 -  Chemotherapy    Induction chemotherapy: Lenalidomide 72m PO Qday, d1-14 + Bortezomib 1.360mm2 IV d1,4,8,11 + dexamethasone 2010mO QWk Q21d --Cycle #1, 04/05/50/70omplicated by grade 2 rash within 1 week of initiation, as well as a aggressive rising creatinine; d11 held due to worsening rash --Cycle #2, 04/13/17: Initiated without lenalidomide due to persistent Grade 3 anemia --Cycle #3, 05/18/17: dosing changed to weekly bortezomib, cycle duration extended to 4 weeks --Cycle #4, 06/15/17: --Cycle #5, 07/13/17: --Cycle #6, 08/10/17:       06/08/2017 Pathology Results    BM Bx:  --Pathology: Plasma cells are down to 1% with polyclonal appearance. --CytoGen: Normal, 46,XY      06/15/2017 Tumor Marker    tProt 7.1, Alb 3.8, Ca 9.7, Cr 1.6, AP ... LDH 153, beta-2 microglobulin 3.3 SPEP -- No M-spike; SIFE -- normal IgG 688, IgA 105, IgM 47; kappa 50.7, lambda 19.3, KLR 2.63 WBC 3.6, ANC 2.1, Hgb 9.4, Plt 204      08/10/2017 Tumor Marker    tProt 7.0, Alb 3.9, Ca 9.5, Cr 2.3, AP 172 LDH 144, beta-2 microglobulin 3.6 SPEP -- pending IgG 771, IgA 123, IgM 55; kappa -- pending, lambda -- pending WBC 6.5, ANC 5.1, Hgb 9.4, Plt 228      08/29/2017 Remission    BM Bx: HYPERCELLULAR BONE MARROW FOR AGE WITH TRILINEAGE HEMATOPOIESIS AND 1% PLASMA CELLS. The bone marrow is hypercellular for age with trilineage hematopoiesis but with slight erythroid hyperplasia. There are nonspecific myeloid changes present likely related to previous therapy. The plasma cells represent 1% of all cells in the aspirate with lack of large aggregates and  display polyclonal staining pattern for kappa and lambda light chains. Hence, there is no definite evidence of residual plasma cell neoplasm in this material.  --CytoGen: No clonal abnormalities --FISH: normal results      09/21/2017 -  Chemotherapy    Maintenance Bortezomib 1.3mg26m SQ Q14d        Medical History: Past Medical History:  Diagnosis Date  . Anemia   . Arthritis    shoulders,feet   . Cataract    per eye dr -has appt 5-11  . CKD (chronic kidney disease), stage III (HCC)[place]. Elevated PSA   . History of ketoacidosis    03-29-2015   . History of sepsis    07-25-2013  non-compliant w/ medication  . Hyperlipidemia   . Hypertension   . Nocturia   . Peripheral neuropathy   . Prostate cancer (HCC)[place]. Type 2 diabetes mellitus with insulin  therapy [facility])     Surgical History: Past Surgical History:  Procedure Laterality Date  . CIRCUMCISION  1990's  . PROSTATE BIOPSY N/A 12/21/2015   Procedure: BIOPSY TRANSRECTAL ULTRASONIC PROSTATE (TUBP);  Surgeon: Festus Aloe, MD;  Location: [facility];  Service: Urology;  Laterality: N/A;    Family History: Family History  Problem Relation Age of Onset  . Cancer Neg Hx   . Colon cancer Neg Hx   . Colon polyps Neg Hx   . Esophageal cancer Neg Hx   . Rectal cancer Neg Hx   . Stomach cancer Neg Hx     Social History: Social History   Socioeconomic History  . Marital status: Single    Spouse name: Not on file  . Number of children: Not on file  . Years of education: Not on file  . Highest education level: Not on file  Occupational History  . Not on file  Social Needs  . Financial resource strain: Not on file  . Food insecurity:    Worry: Not on file    Inability: Not on file  . Transportation needs:    Medical: Not on file    Non-medical: Not on file  Tobacco Use  . Smoking status: Current Every Day Smoker    Packs/day: 0.25    Years: 50.00    Pack years: 12.50    Types: Cigarettes  .  Smokeless tobacco: Never Used  . Tobacco comment: 1 ppwk-4-5 a day   Substance and Sexual Activity  . Alcohol use: Yes    Alcohol/week: 0.6 oz    Types: 1 Cans of beer per week    Comment: 1 every day-quit couple weeks ago  . Drug use: No  . Sexual activity: Not Currently  Lifestyle  . Physical activity:    Days per week: Not on file    Minutes per session: Not on file  . Stress: Not on file  Relationships  . Social connections:    Talks on phone: Not on file    Gets together: Not on file    Attends religious service: Not on file    Active member of club or organization: Not on file    Attends meetings of clubs or organizations: Not on file    Relationship status: Not on file  . Intimate partner violence:    Fear of current or ex partner: Not on file    Emotionally abused: Not on file    Physically abused: Not on file    Forced sexual activity: Not on file  Other Topics Concern  . Not on file  Social History Narrative  . Not on file    Allergies: No Known Allergies  Medications:  Current Outpatient Medications  Medication Sig Dispense Refill  . ACCU-CHEK AVIVA PLUS test strip 1 each by Other route See admin instructions. 100 each 12  . acyclovir (ZOVIRAX) 400 MG tablet TAKE 1 TABLET BY MOUTH 2 TIMES DAILY. 60 tablet 3  . amLODipine (NORVASC) 5 MG tablet Take 1 tablet (5 mg total) by mouth daily. 90 tablet 3  . aspirin EC 81 MG tablet Take 1 tablet (81 mg total) by mouth daily. 90 tablet 3  . Cholecalciferol (VITAMIN D) 2000 units CAPS Take 2,000 Units by mouth daily.    [name] gabapentin (NEURONTIN) 300 MG capsule TAKE 1 CAPSULE BY MOUTH IN THE MORNING AND AT LUNCH, TAKE 2 CAPSULES BY MOUTH AT NIGHT. 120 capsule 4  . glucose blood test strip See  admin instructions.    . insulin aspart protamine - aspart (NOVOLOG 70/30 MIX) (70-30) 100 UNIT/ML FlexPen Inject 0.09 mLs (9 Units total) into the skin 2 (two) times daily with a meal. 15 mL 6  . lidocaine-prilocaine (EMLA) cream  Apply to affected area once 30 g 3  . mometasone (ELOCON) 0.1 % cream Apply 1 application topically daily. 45 g 3  . Multiple Vitamin (MULTIVITAMIN WITH MINERALS) TABS tablet Take 1 tablet by mouth daily. 60 tablet 2  . ondansetron (ZOFRAN) 8 MG tablet Take 1 tablet (8 mg total) by mouth 2 (two) times daily as needed (Nausea or vomiting). 30 tablet 1  . pravastatin (PRAVACHOL) 20 MG tablet Take 1 tablet (20 mg total) by mouth daily. 90 tablet 3  . prochlorperazine (COMPAZINE) 10 MG tablet Take 1 tablet (10 mg total) by mouth every 6 (six) hours as needed (Nausea or vomiting). 30 tablet 1  . senna-docusate (SENOKOT-S) 8.6-50 MG tablet Take 2 tablets by mouth 2 (two) times daily. 60 tablet 2   Current Facility-Administered Medications  Medication Dose Route Frequency Provider Last Rate Last Dose  . 0.9 %  sodium chloride infusion  500 mL Intravenous Continuous Pyrtle, Lajuan Lines, MD        Review of Systems: Review of Systems  Constitutional: Positive for fatigue.  Eyes: Negative.   Cardiovascular: Negative.   Endocrine: Negative.   Genitourinary: Negative.    Hematological: Negative.   Psychiatric/Behavioral: Negative.   All other systems reviewed and are negative.    PHYSICAL EXAMINATION Blood pressure 117/76, pulse 86, temperature 98.5 F (36.9 C), temperature source Oral, resp. rate 18, height '5\' 4"'  (1.626 m), weight 156 lb 14.4 oz (71.2 kg), SpO2 100 %.  ECOG PERFORMANCE STATUS: 2 - Symptomatic, <50% confined to bed  Physical Exam  Constitutional: He is oriented to person, place, and time and well-developed, well-nourished, and in no distress. No distress.  HENT:  Head: Normocephalic and atraumatic.  Mouth/Throat: Oropharynx is clear and moist. No oropharyngeal exudate.  Eyes: Pupils are equal, round, and reactive to light. Conjunctivae and EOM are normal. No scleral icterus.  Neck: No thyromegaly present.  Cardiovascular: Normal rate and regular rhythm.  No murmur  heard. Pulmonary/Chest: Effort normal and breath sounds normal. No respiratory distress. He has no wheezes.  Abdominal: Soft. He exhibits no distension and no mass. There is no tenderness.  Musculoskeletal: He exhibits no edema or tenderness.  Lymphadenopathy:    He has no cervical adenopathy.  Neurological: He is alert and oriented to person, place, and time. He has normal reflexes. No cranial nerve deficit.  Skin: Skin is warm and dry. No rash noted. He is not diaphoretic. No erythema.     LABORATORY DATA: I have personally reviewed the data as listed: Appointment on 11/14/2017  Component Date Value Ref Range Status  . IgG (Immunoglobin G), Serum 11/14/2017 804  700 - 1,600 mg/dL Final  . IgA 11/14/2017 123  61 - 437 mg/dL Final  . IgM (Immunoglobulin M), Srm 11/14/2017 66  20 - 172 mg/dL Final  . Total Protein ELP 11/14/2017 6.7  6.0 - 8.5 g/dL Corrected  . Albumin SerPl Elph-Mcnc 11/14/2017 3.8  2.9 - 4.4 g/dL Corrected  . Alpha 1 11/14/2017 0.2  0.0 - 0.4 g/dL Corrected  . Alpha2 Glob SerPl Elph-Mcnc 11/14/2017 0.9  0.4 - 1.0 g/dL Corrected  . B-Globulin SerPl Elph-Mcnc 11/14/2017 0.9  0.7 - 1.3 g/dL Corrected  . Gamma Glob SerPl Elph-Mcnc 11/14/2017 0.8  0.4 -  1.8 g/dL Corrected  . M Protein SerPl Elph-Mcnc 11/14/2017 Not Observed  Not Observed g/dL Corrected  . Globulin, Total 11/14/2017 2.9  2.2 - 3.9 g/dL Corrected  . Albumin/Glob SerPl 11/14/2017 1.4  0.7 - 1.7 Corrected  . IFE 1 11/14/2017 Comment   Corrected   An apparent normal immunofixation pattern.  . Please Note 11/14/2017 Comment   Corrected   Comment: (NOTE) Protein electrophoresis scan will follow via computer, mail, or courier delivery. Performed At: [facility] [place], [place] [id number] Rush Farmer MD [id number] Performed at [facility], [place] [address], [place], [place] [address]   . Folate 11/14/2017 10.6  >5.9 ng/mL Final   Performed at [facility], [place] [address] [place], [place] [address]  . Vitamin B-12 11/14/2017 1,524* 180 - 914 pg/mL Final   Comment: (NOTE) This assay is not validated for testing neonatal or myeloproliferative syndrome specimens for Vitamin B12 levels. Performed at [facility], [place] [address] [place], [place] [address]   . Methylmalonic Acid, Quantitative 11/14/2017 232  0 - 378 nmol/L Final  . Disclaimer: 11/14/2017 Comment   Final   Comment: (NOTE) This test was developed and its performance characteristics determined by [facility]. It has not been cleared or approved by the Food and Drug Administration. Performed At: [facility] [place], [place] [id number] Rush Farmer MD [id number] Performed at [facility], [place] [address], [place], [place] [address]   Appointment on 11/14/2017  Component Date Value Ref Range Status  . LDH 11/14/2017 161  125 - 245 U/L Final   Performed at [facility], [place] [address], [place], [place] [address]  . Kappa free light chain 11/14/2017 50.8* 3.3 - 19.4 mg/L Final  . Lamda free light chains 11/14/2017 18.7  5.7 - 26.3 mg/L Final  . Kappa, lamda light chain ratio 11/14/2017 2.72* 0.26 - 1.65 Final   Comment: (NOTE) Performed At: [facility] [place], [place] [id number] Rush Farmer MD [id number] Performed at [facility], [place] [address], [place],  [address]   . Sodium 11/14/2017 141  136 - 145 mmol/L Final  . Potassium 11/14/2017 3.5  3.5 - 5.1 mmol/L Final  . Chloride 11/14/2017 108  98 - 109 mmol/L Final  . CO2 11/14/2017 25  22 - 29 mmol/L Final  . Glucose, Bld 11/14/2017 134  70 - 140 mg/dL Final  . BUN 11/14/2017 22  7 - 26 mg/dL Final  . Creatinine 11/14/2017 1.58* 0.70 - 1.30 mg/dL Final  . Calcium 11/14/2017 9.5  8.4 - 10.4 mg/dL Final  . Total Protein 11/14/2017 6.8  6.4 - 8.3 g/dL Final  . Albumin  11/14/2017 3.8  3.5 - 5.0 g/dL Final  . AST 11/14/2017 15  5 - 34 U/L Final  . ALT 11/14/2017 13  0 - 55 U/L Final  . Alkaline Phosphatase 11/14/2017 112  40 - 150 U/L Final  . Total Bilirubin 11/14/2017 0.4  0.2 - 1.2 mg/dL Final  . GFR, Est Non Af Am 11/14/2017 43* >60 mL/min Final  . GFR, Est AFR Am 11/14/2017 50* >60 mL/min Final   Comment: (NOTE) The eGFR has been calculated using the CKD EPI equation. This calculation has not been validated in all clinical situations. eGFR's persistently <60 mL/min signify possible Chronic Kidney Disease.   . Anion gap 11/14/2017 8  3 - 11 Final   Performed  at [facility], [place] [address], [place], [place] [address]  . WBC Count 11/14/2017 6.3  4.0 - 10.3 K/uL Final  . RBC 11/14/2017 2.97* 4.20 - 5.82 MIL/uL Final  . Hemoglobin 11/14/2017 9.8* 13.0 - 17.1 g/dL Final  . HCT 11/14/2017 29.8* 38.4 - 49.9 % Final  . MCV 11/14/2017 100.3* 79.3 - 98.0 fL Final  . MCH 11/14/2017 33.0  27.2 - 33.4 pg Final  . MCHC 11/14/2017 32.9  32.0 - 36.0 g/dL Final  . RDW 11/14/2017 13.8  11.0 - 14.6 % Final  . Platelet Count 11/14/2017 217  140 - 400 K/uL Final  . Neutrophils Relative % 11/14/2017 56  % Final  . Neutro Abs 11/14/2017 3.6  1.5 - 6.5 K/uL Final  . Lymphocytes Relative 11/14/2017 16  % Final  . Lymphs Abs 11/14/2017 1.0  0.9 - 3.3 K/uL Final  . Monocytes Relative 11/14/2017 5  % Final  . Monocytes Absolute 11/14/2017 0.3  0.1 - 0.9 K/uL Final  . Eosinophils Relative 11/14/2017 23  % Final  . Eosinophils Absolute 11/14/2017 1.4* 0.0 - 0.5 K/uL Final  . Basophils Relative 11/14/2017 0  % Final  . Basophils Absolute 11/14/2017 0.0  0.0 - 0.1 K/uL Final   Performed at [facility], [place] [address] [place], [place] [address]  . Beta-2 Microglobulin 11/14/2017 2.3  0.6 - 2.4 mg/L Final   Comment: (NOTE) Siemens Immulite 2000 Immunochemiluminometric assay (ICMA) Values obtained with different assay  methods or kits cannot be used interchangeably. Results cannot be interpreted as absolute evidence of the presence or absence of malignant disease. Performed At: [facility] [place], [place] [id number] Rush Farmer MD [id number] Performed at [facility], [place] [address], [place],  [address]        Robert Sax, MD

## 2017-12-04 NOTE — Assessment & Plan Note (Signed)
72 y.o. male with diagnosis of IgA lambda active multiple myeloma, ISS Stage I. Active disease diagnosed based on presence of anemia, kidney insufficiency, and paraproteinemia with significant predominance of lambda light chains as well as significant elevation of IgA. Bone marrow biopsy confirmed presence of atypical monoclonal plasma cell process in the bone marrow comprising at least 7% of the cellularity. Based on the findings, patient was started on treatment with lenalidomide, bortezomib, and low-dose dexamethasone based on the anticipated tolerance by the patient. Lenalidomide was started at 10 mg daily based on creatinine clearance of 42, dexamethasone dose was reduced to 20 mg weekly based on patient's age. Treatment was complicated by rapid development of cutaneous rash attributed to lenalidomide, inaddition to decreased renal function and now patient has persistent severe anemia. Side-effects were attributed to Lenalidomide and lenalidomide was discontinued with subsequent recovery.  Patient has been receiving bortezomib weekly with low-dose dexamethasone in 4-day cycles.  Patient has completed induction systemic therapy for his disease with repeat bone marrow biopsy confirming complete response including no evidence of minimal residual disease by cytogenetics or FISH.  Patient is recovering well from his therapy.  At this time, our options include proceeding to consolidative therapy with high-dose chemotherapy and autologous stem cell transplantation, initiation of maintenance therapy and observation.  Initially, when the treatment was started, [name] to perform as well as he did and to demonstrate remarkable recovery of the performance status that he has accomplished.  At this point in time, I would recommend him to proceed with at least evaluation by [facility] oncology for possible consolidative therapy.  At this time, patient is hesitant to proceed with that option  after some consideration, patient agreed to proceed with maintenance systemic therapy.  I have recommended bortezomib administered every other week for a total duration of 2 years.  No further zoledronic acid due to previous history of significant creatinine spikes every time the medication was used.   Clinical evaluation lab work today permissive to continue maintenance therapy unchanged.  No evidence of progressive neuropathy.  Plan: -Continue with maintenance bortezomib therapy  administered every other week.  Plan is to continue treatment for 2 years or until the disease progression. -Continue acyclovir at least for duration of bortezomib maintenance and likely with a 3 to 6 months overlap to reduce the chances of breakthrough activation of varicella-zoster -Will conduct disease assessment with biochemical panel including SPEP, S IFE, serum free light chain assays every 3 months -Return to clinic in 1 month: Labs, clinic visit, bortezomib administration.

## 2017-12-05 ENCOUNTER — Other Ambulatory Visit: Payer: Self-pay | Admitting: Internal Medicine

## 2017-12-05 MED ORDER — PRAVASTATIN SODIUM 20 MG PO TABS: 20.0000 mg | ORAL_TABLET | Freq: Every day | ORAL | 3 refills | Status: DC

## 2017-12-05 MED ORDER — AMLODIPINE BESYLATE 5 MG PO TABS: 5.0000 mg | ORAL_TABLET | Freq: Every day | ORAL | 3 refills | Status: DC

## 2017-12-06 ENCOUNTER — Encounter: Payer: Self-pay | Admitting: Internal Medicine

## 2017-12-06 ENCOUNTER — Ambulatory Visit: Payer: Medicare HMO | Attending: Internal Medicine | Admitting: Internal Medicine

## 2017-12-06 VITALS — BP 111/75 | HR 86 | Temp 97.6°F | Resp 16 | Wt 141.8 lb

## 2017-12-06 DIAGNOSIS — F17210 Nicotine dependence, cigarettes, uncomplicated: Secondary | ICD-10-CM | POA: Insufficient documentation

## 2017-12-06 DIAGNOSIS — N183 Chronic kidney disease, stage 3 unspecified: Secondary | ICD-10-CM

## 2017-12-06 DIAGNOSIS — D638 Anemia in other chronic diseases classified elsewhere: Secondary | ICD-10-CM | POA: Insufficient documentation

## 2017-12-06 DIAGNOSIS — E538 Deficiency of other specified B group vitamins: Secondary | ICD-10-CM | POA: Diagnosis not present

## 2017-12-06 DIAGNOSIS — D539 Nutritional anemia, unspecified: Secondary | ICD-10-CM | POA: Insufficient documentation

## 2017-12-06 DIAGNOSIS — Z79899 Other long term (current) drug therapy: Secondary | ICD-10-CM | POA: Diagnosis not present

## 2017-12-06 DIAGNOSIS — E785 Hyperlipidemia, unspecified: Secondary | ICD-10-CM | POA: Insufficient documentation

## 2017-12-06 DIAGNOSIS — Z794 Long term (current) use of insulin: Secondary | ICD-10-CM | POA: Diagnosis not present

## 2017-12-06 DIAGNOSIS — Z7982 Long term (current) use of aspirin: Secondary | ICD-10-CM | POA: Insufficient documentation

## 2017-12-06 DIAGNOSIS — I10 Essential (primary) hypertension: Secondary | ICD-10-CM | POA: Diagnosis present

## 2017-12-06 DIAGNOSIS — I129 Hypertensive chronic kidney disease with stage 1 through stage 4 chronic kidney disease, or unspecified chronic kidney disease: Secondary | ICD-10-CM | POA: Insufficient documentation

## 2017-12-06 DIAGNOSIS — R634 Abnormal weight loss: Secondary | ICD-10-CM | POA: Insufficient documentation

## 2017-12-06 DIAGNOSIS — C61 Malignant neoplasm of prostate: Secondary | ICD-10-CM | POA: Diagnosis not present

## 2017-12-06 DIAGNOSIS — R208 Other disturbances of skin sensation: Secondary | ICD-10-CM | POA: Diagnosis not present

## 2017-12-06 DIAGNOSIS — C9001 Multiple myeloma in remission: Secondary | ICD-10-CM | POA: Insufficient documentation

## 2017-12-06 DIAGNOSIS — F17200 Nicotine dependence, unspecified, uncomplicated: Secondary | ICD-10-CM

## 2017-12-06 DIAGNOSIS — E1122 Type 2 diabetes mellitus with diabetic chronic kidney disease: Secondary | ICD-10-CM | POA: Diagnosis not present

## 2017-12-06 DIAGNOSIS — M170 Bilateral primary osteoarthritis of knee: Secondary | ICD-10-CM | POA: Diagnosis not present

## 2017-12-06 DIAGNOSIS — E119 Type 2 diabetes mellitus without complications: Secondary | ICD-10-CM | POA: Diagnosis present

## 2017-12-06 LAB — POCT GLYCOSYLATED HEMOGLOBIN (HGB A1C): HBA1C, POC (CONTROLLED DIABETIC RANGE): 6.6 % (ref 0.0–7.0)

## 2017-12-06 LAB — GLUCOSE, POCT (MANUAL RESULT ENTRY): POC GLUCOSE: 148 mg/dL — AB (ref 70–99)

## 2017-12-06 MED ORDER — DICLOFENAC SODIUM 1 % TD GEL: 2.0000 g | Freq: Four times a day (QID) | TRANSDERMAL | 2 refills | Status: DC

## 2017-12-06 MED FILL — DICLOFENAC SODIUM 1% GEL: 1 | 12 days supply | Qty: 100 | Fill #0

## 2017-12-06 NOTE — Progress Notes (Signed)
Patient ID: Robert Sparks, male    DOB: 1946/02/17  MRN: [id number]  CC: Diabetes and Hypertension   Subjective: Robert Sparks is a 72 y.o. male who presents for chronic ds management. His concerns today include:  Patient with history of HTN, diabetes type 2,HL,CKD stage3, tobacco dependence, EtOH abuse, prostate CA, vit B 12 def and multiple myeloma .   MM: Patient has completed active treatment.  He was offered maintenance therapy versus referral to [facility] for consolidation and consideration of autologous bone marrow transplant.  Patient decided on maintenance therapy. -loss 15 lbs in past 2 wks.   No appetite over past 1 wk, he is not sure why.  Denies depressed mood.  No problems swallowing. No N/V/abdominal/diarrhea.  Some Constipattion No fever; + night sweats No blood in stools. No cough.  - Endorses hesitancy and stop and go urine.  Saw urologist about 1-2 wks ago.  Given "shot" which I think is hormone for treatment of prostate CA Feels weak and gets tired quick when he walks.  Known anemia requiring iron transfusion earlier this yr by nephrology Legs very sensitive to touch for 1 mth.  Muscles and lower legs feels sore.  He is on a statin Knees ache and are stiff  DM:  Checking BS 2-3 x daily Reports BS have been good except if "I eat the wrong thing."  Tob"  Smokes 1/2 pk a day.  Smoked since age 67.    Patient Active Problem List   Diagnosis Date Noted  . Vitamin B12 deficiency 09/04/2017  . Pre-ulcerative corn or callous 04/12/2017  . Cataract of both eyes 04/12/2017  . Tobacco dependence 04/12/2017  . Hypokalemia 03/23/2017  . Multiple myeloma in remission ([place]) 02/23/2017  . Oral leukoplakia 02/09/2017  . Lightheadedness 11/10/2016  . Tobacco abuse 06/21/2016  . Hyperlipidemia 06/21/2016  . CKD (chronic kidney disease) stage 3, GFR 30-59 ml/min (HCC) 02/23/2016  . Malignant neoplasm of prostate ([place]) 02/21/2016  . Controlled type 2 diabetes  mellitus with stage 3 chronic kidney disease, with long-term current use of insulin ([place]) 03/29/2015  . HTN (hypertension) 07/28/2013  . Anemia, chronic disease 07/26/2013  . ETOH abuse 07/25/2013     Current Outpatient Medications on File Prior to Visit  Medication Sig Dispense Refill  . ACCU-CHEK AVIVA PLUS test strip 1 each by Other route See admin instructions. 100 each 12  . acyclovir (ZOVIRAX) 400 MG tablet TAKE 1 TABLET BY MOUTH 2 TIMES DAILY. 60 tablet 3  . amLODipine (NORVASC) 5 MG tablet Take 1 tablet (5 mg total) by mouth daily. 90 tablet 3  . aspirin EC 81 MG tablet Take 1 tablet (81 mg total) by mouth daily. 90 tablet 3  . Cholecalciferol (VITAMIN D) 2000 units CAPS Take 2,000 Units by mouth daily.    [name] gabapentin (NEURONTIN) 300 MG capsule TAKE 1 CAPSULE BY MOUTH IN THE MORNING AND AT LUNCH, TAKE 2 CAPSULES BY MOUTH AT NIGHT. 120 capsule 4  . glucose blood test strip See admin instructions.    . insulin aspart protamine - aspart (NOVOLOG 70/30 MIX) (70-30) 100 UNIT/ML FlexPen Inject 0.09 mLs (9 Units total) into the skin 2 (two) times daily with a meal. 15 mL 6  . lidocaine-prilocaine (EMLA) cream Apply to affected area once 30 g 3  . mometasone (ELOCON) 0.1 % cream Apply 1 application topically daily. 45 g 3  . Multiple Vitamin (MULTIVITAMIN WITH MINERALS) TABS tablet Take 1 tablet by mouth daily. [place]  tablet 2  . ondansetron (ZOFRAN) 8 MG tablet Take 1 tablet (8 mg total) by mouth 2 (two) times daily as needed (Nausea or vomiting). 30 tablet 1  . pravastatin (PRAVACHOL) 20 MG tablet Take 1 tablet (20 mg total) by mouth daily. 90 tablet 3  . prochlorperazine (COMPAZINE) 10 MG tablet Take 1 tablet (10 mg total) by mouth every 6 (six) hours as needed (Nausea or vomiting). 30 tablet 1  . senna-docusate (SENOKOT-S) 8.6-50 MG tablet Take 2 tablets by mouth 2 (two) times daily. 60 tablet 2   Current Facility-Administered Medications on File Prior to Visit  Medication Dose Route  Frequency Provider Last Rate Last Dose  . 0.9 %  sodium chloride infusion  500 mL Intravenous Continuous Pyrtle, Lajuan Lines, MD        No Known Allergies  Social History   Socioeconomic History  . Marital status: Single    Spouse name: Not on file  . Number of children: Not on file  . Years of education: Not on file  . Highest education level: Not on file  Occupational History  . Not on file  Social Needs  . Financial resource strain: Not on file  . Food insecurity:    Worry: Not on file    Inability: Not on file  . Transportation needs:    Medical: Not on file    Non-medical: Not on file  Tobacco Use  . Smoking status: Current Every Day Smoker    Packs/day: 0.25    Years: 50.00    Pack years: 12.50    Types: Cigarettes  . Smokeless tobacco: Never Used  . Tobacco comment: 1 ppwk-4-5 a day   Substance and Sexual Activity  . Alcohol use: Yes    Alcohol/week: 0.6 oz    Types: 1 Cans of beer per week    Comment: 1 every day-quit couple weeks ago  . Drug use: No  . Sexual activity: Not Currently  Lifestyle  . Physical activity:    Days per week: Not on file    Minutes per session: Not on file  . Stress: Not on file  Relationships  . Social connections:    Talks on phone: Not on file    Gets together: Not on file    Attends religious service: Not on file    Active member of club or organization: Not on file    Attends meetings of clubs or organizations: Not on file    Relationship status: Not on file  . Intimate partner violence:    Fear of current or ex partner: Not on file    Emotionally abused: Not on file    Physically abused: Not on file    Forced sexual activity: Not on file  Other Topics Concern  . Not on file  Social History Narrative  . Not on file    Family History  Problem Relation Age of Onset  . Cancer Neg Hx   . Colon cancer Neg Hx   . Colon polyps Neg Hx   . Esophageal cancer Neg Hx   . Rectal cancer Neg Hx   . Stomach cancer Neg Hx      Past Surgical History:  Procedure Laterality Date  . CIRCUMCISION  1990's  . PROSTATE BIOPSY N/A 12/21/2015   Procedure: BIOPSY TRANSRECTAL ULTRASONIC PROSTATE (TUBP);  Surgeon: Festus Aloe, MD;  Location: [facility];  Service: Urology;  Laterality: N/A;    ROS: Review of Systems Negative except as stated above  PHYSICAL EXAM: BP 111/75   Pulse 86   Temp 97.6 F (36.4 C) (Oral)   Resp 16   Wt 141 lb 12.8 oz (64.3 kg)   SpO2 100%   BMI 24.34 kg/m   Wt Readings from Last 3 Encounters:  12/06/17 141 lb 12.8 oz (64.3 kg)  11/14/17 156 lb 14.4 oz (71.2 kg)  10/19/17 155 lb 12.8 oz (70.7 kg)    Physical Exam General appearance -elderly gentleman in NAD.  He has noted weight loss since I last saw him.   Mental status - normal mood, behavior, speech, dress, motor activity, and thought processes Mouth - mucous membranes moist, pharynx normal without lesions Neck - supple, no significant adenopathy Chest - clear to auscultation, no wheezes, rales or rhonchi, symmetric air entry Heart - normal rate, regular rhythm, normal S1, S2, no murmurs, rubs, clicks or gallops Musculoskeletal -knees: No point tenderness.  Mild jt enlargement.  Good range of motion Extremities -no lower extremity edema  skin -lower legs are very sensitive to light touch  Results for orders placed or performed in visit on 12/06/17  POCT glucose (manual entry)  Result Value Ref Range   POC Glucose 148 (A) 70 - 99 mg/dl  POCT glycosylated hemoglobin (Hb A1C)  Result Value Ref Range   Hemoglobin A1C  4.0 - 5.6 %   HbA1c, POC (prediabetic range)  5.7 - 6.4 %   HbA1c, POC (controlled diabetic range) 6.6 0.0 - 7.0 %   ASSESSMENT AND PLAN: 1. Weight loss, unintentional -May be related to his underlying cancer diagnosis.  However he had maintained his weight fairly well the 150s for the past 1 year while undergoing treatment for multiple myeloma.  He does not appear to be depressed. To  consider low-dose CT of the chest for lung cancer screening if weight loss continues. Advised to drink Glucerna or boost shakes to supplement meals. - CBC - TSH - Comprehensive metabolic panel  2. Controlled type 2 diabetes mellitus with stage 3 chronic kidney disease, with long-term current use of insulin (HCC) - POCT glucose (manual entry) - POCT glycosylated hemoglobin (Hb A1C) - Microalbumin / creatinine urine ratio  3. Primary osteoarthritis of both knees - diclofenac sodium (VOLTAREN) 1 % GEL; Apply 2 g topically 4 (four) times daily.  Dispense: 100 g; Refill: 2  4. Hyperalgesia - CK  5. Macrocytic anemia - Vitamin B12  6. Tobacco dependence Advised to quit.  He states that he is cutting down  7. Multiple myeloma in remission [facility]) Followed by hematology oncology  Patient was given the opportunity to ask questions.  Patient verbalized understanding of the plan and was able to repeat key elements of the plan.   Orders Placed This Encounter  Procedures  . Microalbumin / creatinine urine ratio  . CBC  . TSH  . Comprehensive metabolic panel  . Vitamin B12  . CK  . POCT glucose (manual entry)  . POCT glycosylated hemoglobin (Hb A1C)     Requested Prescriptions   Signed Prescriptions Disp Refills  . diclofenac sodium (VOLTAREN) 1 % GEL 100 g 2    Sig: Apply 2 g topically 4 (four) times daily.    Return in about 1 month (around 01/06/2018).  Karle Plumber, MD, FACP

## 2017-12-06 NOTE — Patient Instructions (Signed)
Try to supplement meals with Glucerna or Boost Shakes.

## 2017-12-07 LAB — COMPREHENSIVE METABOLIC PANEL
ALT: 12 IU/L (ref 0–44)
AST: 14 IU/L (ref 0–40)
Albumin/Globulin Ratio: 1.9 (ref 1.2–2.2)
Albumin: 4.8 g/dL (ref 3.6–4.8)
Alkaline Phosphatase: 59 IU/L (ref 39–117)
BUN/Creatinine Ratio: 25 — ABNORMAL HIGH (ref 10–24)
BUN: 42 mg/dL — AB (ref 8–27)
Bilirubin Total: 0.4 mg/dL (ref 0.0–1.2)
CALCIUM: 9.8 mg/dL (ref 8.6–10.2)
CO2: 17 mmol/L — AB (ref 20–29)
CREATININE: 1.7 mg/dL — AB (ref 0.76–1.27)
Chloride: 106 mmol/L (ref 96–106)
GFR, EST AFRICAN AMERICAN: 47 mL/min/{1.73_m2} — AB (ref >59)
GFR, EST NON AFRICAN AMERICAN: 40 mL/min/{1.73_m2} — AB (ref >59)
GLUCOSE: 155 mg/dL — AB (ref 65–99)
Globulin, Total: 2.5 g/dL (ref 1.5–4.5)
Potassium: 4.7 mmol/L (ref 3.5–5.2)
Sodium: 138 mmol/L (ref 134–144)
TOTAL PROTEIN: 7.3 g/dL (ref 6.0–8.5)

## 2017-12-07 LAB — MICROALBUMIN / CREATININE URINE RATIO
CREATININE, UR: 183.7 mg/dL
MICROALB/CREAT RATIO: 98 mg/g{creat} — AB (ref 0.0–30.0)
MICROALBUM., U, RANDOM: 180 ug/mL

## 2017-12-07 LAB — CBC
HEMOGLOBIN: 12.2 g/dL — AB (ref 13.0–17.7)
Hematocrit: 37.1 % — ABNORMAL LOW (ref 37.5–51.0)
MCH: 32.4 pg (ref 26.6–33.0)
MCHC: 32.9 g/dL (ref 31.5–35.7)
MCV: 98 fL — ABNORMAL HIGH (ref 79–97)
PLATELETS: 250 10*3/uL (ref 150–450)
RBC: 3.77 x10E6/uL — AB (ref 4.14–5.80)
RDW: 14.1 % (ref 12.3–15.4)
WBC: 5.4 10*3/uL (ref 3.4–10.8)

## 2017-12-07 LAB — CK: CK TOTAL: 72 U/L (ref 24–204)

## 2017-12-07 LAB — TSH: TSH: 2.75 u[IU]/mL (ref 0.450–4.500)

## 2017-12-07 LAB — VITAMIN B12: Vitamin B-12: >2000 pg/mL — ABNORMAL HIGH (ref 232–1245)

## 2017-12-12 ENCOUNTER — Telehealth: Payer: Self-pay

## 2017-12-12 NOTE — Telephone Encounter (Signed)
Contacted pt to go over lab results pt didn't answer lvm asking pt to give me a call at his earliest convenience   If pt calls back please give results: his anemia/blood count has improved. Thyroid level nl. Kidney function not 100% but fairly stable. Make sure that he drinks several glasses of water daily.

## 2017-12-13 NOTE — Progress Notes (Signed)
HEMATOLOGY/ONCOLOGY CONSULTATION NOTE  Date of Service: 12/14/2017  Patient Care Team: [name], MD as PCP - General (Internal Medicine) [name], MD as Consulting Physician (Hematology and Oncology)  CHIEF COMPLAINTS/PURPOSE OF CONSULTATION:  F/u for continued mx of Multiple Myeloma  Oncologic History:  72 y.o. male with diagnosis of IgA lambda active multiple myeloma, ISS Stage I. Active disease diagnosed based on presence of anemia, kidney insufficiency, and paraproteinemia with significant predominance of lambda light chains as well as significant elevation of IgA. Bone marrow biopsy confirmed presence of atypical monoclonal plasma cell process in the bone marrow comprising at least 7% of the cellularity. Based on the findings, patient was started on treatment with lenalidomide, bortezomib, and low-dose dexamethasone based on the anticipated tolerance by the patient. Lenalidomide was started at 10 mg daily based on creatinine clearance of 42, dexamethasone dose was reduced to 20 mg weekly based on patient's age. Treatment was complicated by rapid development of cutaneous rash attributed to lenalidomide, inaddition to decreased renal function and now patient has persistent severe anemia. Side-effects were attributed to Lenalidomide and lenalidomide was discontinued with subsequent recovery.  Patient has been receiving bortezomib weekly with low-dose dexamethasone in 4-day cycles.  Patient has completed induction systemic therapy for his disease with repeat bone marrow biopsy confirming complete response including no evidence of minimal residual disease by cytogenetics or FISH.  HISTORY OF PRESENTING ILLNESS:   Robert Sparks is a wonderful 72 y.o. male who has been referred to [place] by my colleague Dr Grace Isaac for evaluation and management of Multiple Myeloma. The pt reports that he is doing well overall.   The pt has been previously followed by my colleague Dr  Grace Isaac. The pt was treated with Revlimid, Velcade, and dexamethasone and was subsequently switched to Velcade and Dexamethasone after a Revlimid intolerance.   The pt reports some knee aches and skin soreness in his legs which is worse at night. He notes that he has had diabetic-related peripheral neuropathy. The pt has not seen [facility] yet for a discussion of a BM transplant. He continues taking Acyclovir and notes no difficulty taking maintenance Velcade.   He adds that he takes Gabapentin and sweats.  He notes that his neuropathy is currently stable and denies it worsening.   He notes that a pack of cigarettes lasts him for about 3 days.   Most recent lab results (12/14/17) of CBC and CMP  is as follows: all values are WNL except for RBC at 3.08, HGB at 10.1, HCT at 31.0, MCV at 100.6, Eosinophils Abs at 1.4k, Potassium at 3.4, Glucose at 156, Creatinine at 1.60. LDH 12/14/17 is 141 Beta 2 microglobulin 12/14/17 is 2.4  On review of systems, pt reports knee pain, night time LE skin soreness, peripheral neuropathy, eating well, strong appetite, intermittent sweating and denies fevers, chills, back pains, abdominal pains, and any other symptoms.   [name] DM type 2, HTN, CKD. On Social Hx the pt reports smoking a pack of cigarettes every 3 days.    MEDICAL HISTORY:  Past Medical History:  Diagnosis Date  . Anemia   . Arthritis    shoulders,feet   . Cataract    per eye dr -has appt 5-11  . CKD (chronic kidney disease), stage III ([place])   . Elevated PSA   . History of ketoacidosis    03-29-2015   . History of sepsis    07-25-2013  non-compliant w/  medication  . Hyperlipidemia   . Hypertension   . Nocturia   . Peripheral neuropathy   . Prostate cancer ([place])   . Type 2 diabetes mellitus with insulin therapy ([place])     SURGICAL HISTORY: Past Surgical History:  Procedure Laterality Date  . CIRCUMCISION  1990's  . PROSTATE BIOPSY N/A 12/21/2015   Procedure:  BIOPSY TRANSRECTAL ULTRASONIC PROSTATE (TUBP);  Surgeon: Festus Aloe, MD;  Location: [facility];  Service: Urology;  Laterality: N/A;    SOCIAL HISTORY: Social History   Socioeconomic History  . Marital status: Single    Spouse name: Not on file  . Number of children: Not on file  . Years of education: Not on file  . Highest education level: Not on file  Occupational History  . Not on file  Social Needs  . Financial resource strain: Not on file  . Food insecurity:    Worry: Not on file    Inability: Not on file  . Transportation needs:    Medical: Not on file    Non-medical: Not on file  Tobacco Use  . Smoking status: Current Every Day Smoker    Packs/day: 0.25    Years: 50.00    Pack years: 12.50    Types: Cigarettes  . Smokeless tobacco: Never Used  . Tobacco comment: 1 ppwk-4-5 a day   Substance and Sexual Activity  . Alcohol use: Yes    Alcohol/week: 0.6 oz    Types: 1 Cans of beer per week    Comment: 1 every day-quit couple weeks ago  . Drug use: No  . Sexual activity: Not Currently  Lifestyle  . Physical activity:    Days per week: Not on file    Minutes per session: Not on file  . Stress: Not on file  Relationships  . Social connections:    Talks on phone: Not on file    Gets together: Not on file    Attends religious service: Not on file    Active member of club or organization: Not on file    Attends meetings of clubs or organizations: Not on file    Relationship status: Not on file  . Intimate partner violence:    Fear of current or ex partner: Not on file    Emotionally abused: Not on file    Physically abused: Not on file    Forced sexual activity: Not on file  Other Topics Concern  . Not on file  Social History Narrative  . Not on file    FAMILY HISTORY: Family History  Problem Relation Age of Onset  . Cancer Neg Hx   . Colon cancer Neg Hx   . Colon polyps Neg Hx   . Esophageal cancer Neg Hx   . Rectal cancer Neg  Hx   . Stomach cancer Neg Hx     ALLERGIES:  has No Known Allergies.  MEDICATIONS:  Current Outpatient Medications  Medication Sig Dispense Refill  . ACCU-CHEK AVIVA PLUS test strip 1 each by Other route See admin instructions. 100 each 12  . acyclovir (ZOVIRAX) 400 MG tablet TAKE 1 TABLET BY MOUTH 2 TIMES DAILY. 60 tablet 3  . amLODipine (NORVASC) 5 MG tablet Take 1 tablet (5 mg total) by mouth daily. 90 tablet 3  . aspirin EC 81 MG tablet Take 1 tablet (81 mg total) by mouth daily. 90 tablet 3  . Cholecalciferol (VITAMIN D) 2000 units CAPS Take 2,000 Units by mouth daily.    [name]  diclofenac sodium (VOLTAREN) 1 % GEL Apply 2 g topically 4 (four) times daily. 100 g 2  . gabapentin (NEURONTIN) 300 MG capsule TAKE 1 CAPSULE BY MOUTH IN THE MORNING AND AT LUNCH, TAKE 2 CAPSULES BY MOUTH AT NIGHT. 120 capsule 4  . glucose blood test strip See admin instructions.    . insulin aspart protamine - aspart (NOVOLOG 70/30 MIX) (70-30) 100 UNIT/ML FlexPen Inject 0.09 mLs (9 Units total) into the skin 2 (two) times daily with a meal. 15 mL 6  . lidocaine-prilocaine (EMLA) cream Apply to affected area once 30 g 3  . mometasone (ELOCON) 0.1 % cream Apply 1 application topically daily. 45 g 3  . Multiple Vitamin (MULTIVITAMIN WITH MINERALS) TABS tablet Take 1 tablet by mouth daily. 60 tablet 2  . ondansetron (ZOFRAN) 8 MG tablet Take 1 tablet (8 mg total) by mouth 2 (two) times daily as needed (Nausea or vomiting). 30 tablet 1  . pravastatin (PRAVACHOL) 20 MG tablet Take 1 tablet (20 mg total) by mouth daily. 90 tablet 3  . prochlorperazine (COMPAZINE) 10 MG tablet Take 1 tablet (10 mg total) by mouth every 6 (six) hours as needed (Nausea or vomiting). 30 tablet 1  . senna-docusate (SENOKOT-S) 8.6-50 MG tablet Take 2 tablets by mouth 2 (two) times daily. 60 tablet 2   Current Facility-Administered Medications  Medication Dose Route Frequency Provider Last Rate Last Dose  . 0.9 %  sodium chloride infusion   500 mL Intravenous Continuous Pyrtle, Lajuan Lines, MD        REVIEW OF SYSTEMS:    10 Point review of Systems was done is negative except as noted above.  PHYSICAL EXAMINATION: ECOG PERFORMANCE STATUS: 1 - Symptomatic but completely ambulatory  GENERAL:alert, in no acute distress and comfortable SKIN: no acute rashes, no significant lesions EYES: conjunctiva are pink and non-injected, sclera anicteric OROPHARYNX: MMM, no exudates, no oropharyngeal erythema or ulceration NECK: supple, no JVD LYMPH:  no palpable lymphadenopathy in the cervical, axillary or inguinal regions LUNGS: clear to auscultation b/l with normal respiratory effort HEART: regular rate & rhythm ABDOMEN:  normoactive bowel sounds , non tender, not distended. No palpable hepatosplenomegaly.  Extremity: no pedal edema PSYCH: alert & oriented x 3 with fluent speech NEURO: no focal motor/sensory deficits  LABORATORY DATA:  I have reviewed the data as listed  . CBC Latest Ref Rng & Units 12/14/2017 12/06/2017 11/14/2017  WBC 4.0 - 10.3 K/uL 5.0 5.4 6.3  Hemoglobin 13.0 - 17.1 g/dL 10.1(L) 12.2(L) 9.8(L)  Hematocrit 38.4 - 49.9 % 31.0(L) 37.1(L) 29.8(L)  Platelets 140 - 400 K/uL 164 250 217    . CMP Latest Ref Rng & Units 12/14/2017 12/06/2017 11/14/2017  Glucose 70 - 140 mg/dL 156(H) 155(H) 134  BUN 7 - 26 mg/dL 20 42(H) 22  Creatinine 0.70 - 1.30 mg/dL 1.60(H) 1.70(H) 1.58(H)  Sodium 136 - 145 mmol/L 140 138 141  Potassium 3.5 - 5.1 mmol/L 3.4(L) 4.7 3.5  Chloride 98 - 109 mmol/L 105 106 108  CO2 22 - 29 mmol/L 25 17(L) 25  Calcium 8.4 - 10.4 mg/dL 9.2 9.8 9.5  Total Protein 6.4 - 8.3 g/dL 6.4 7.3 6.8  Total Bilirubin 0.2 - 1.2 mg/dL 0.4 0.4 0.4  Alkaline Phos 40 - 150 U/L 91 59 112  AST 5 - 34 U/L _0 ALT 0 - 55 U/L _1 08/29/17 Cytogenetics:   03/13/17 Cytogenetics:      RADIOGRAPHIC STUDIES: I have  personally reviewed the radiological images as listed and agreed with the findings in the  report. No results found.  ASSESSMENT & PLAN:  71 y.o. male with   1. Multiple Myeloma -Currently in remission PLAN -Discussed patient's most recent labs from 12/14/17, blood counts and chemistries are stable.  -Will continue maintenance Velcade every other week -Discussed that pt should let me know if his peripheral neuropathy worsens as Velcade could make it worse. -Recommend PCP Dr Wynetta Emery consider [place] with ABIs to rule out peripheral arterial disease -Counseled pt in smoking cessation -Will see pt back in 4 weeks    RTC with Dr Irene Limbo in 4 weeks  Continue maintenance Velcade q2weeks with labs - plz schedule next 4 treatments   All of the patients questions were answered with apparent satisfaction. The patient knows to call the clinic with any problems, questions or concerns.  The toal time spent in the appt was 25 minutes and more than 50% was on counseling and direct patient cares.     Sullivan Lone MD MS AAHIVMS [facility], [facility] Hematology/Oncology Physician [facility]  (Office):       [phone] (Work cell):  [phone] (Fax):           [phone]  12/14/2017 9:53 AM  I, Baldwin [place], am acting as a Education administrator for Dr Irene Limbo.   .I have reviewed the above documentation for accuracy and completeness, and I agree with the above. Brunetta Genera MD

## 2017-12-14 ENCOUNTER — Inpatient Hospital Stay: Payer: Medicare HMO

## 2017-12-14 ENCOUNTER — Inpatient Hospital Stay: Payer: Medicare HMO | Attending: Hematology and Oncology

## 2017-12-14 ENCOUNTER — Telehealth: Payer: Self-pay | Admitting: Hematology

## 2017-12-14 ENCOUNTER — Other Ambulatory Visit: Payer: Self-pay

## 2017-12-14 ENCOUNTER — Inpatient Hospital Stay (HOSPITAL_BASED_OUTPATIENT_CLINIC_OR_DEPARTMENT_OTHER): Payer: Medicare HMO | Admitting: Hematology

## 2017-12-14 VITALS — BP 145/79 | HR 73 | Temp 98.3°F | Resp 17

## 2017-12-14 DIAGNOSIS — N183 Chronic kidney disease, stage 3 unspecified: Secondary | ICD-10-CM

## 2017-12-14 DIAGNOSIS — C9001 Multiple myeloma in remission: Secondary | ICD-10-CM

## 2017-12-14 DIAGNOSIS — Z5112 Encounter for antineoplastic immunotherapy: Secondary | ICD-10-CM | POA: Insufficient documentation

## 2017-12-14 DIAGNOSIS — C61 Malignant neoplasm of prostate: Secondary | ICD-10-CM

## 2017-12-14 LAB — CMP (CANCER CENTER ONLY)
ALT: 22 U/L (ref 0–55)
ANION GAP: 10 (ref 3–11)
AST: 22 U/L (ref 5–34)
Albumin: 3.8 g/dL (ref 3.5–5.0)
Alkaline Phosphatase: 91 U/L (ref 40–150)
BUN: 20 mg/dL (ref 7–26)
CO2: 25 mmol/L (ref 22–29)
Calcium: 9.2 mg/dL (ref 8.4–10.4)
Chloride: 105 mmol/L (ref 98–109)
Creatinine: 1.6 mg/dL — ABNORMAL HIGH (ref 0.70–1.30)
GFR, EST AFRICAN AMERICAN: 49 mL/min — AB (ref >60)
GFR, Estimated: 42 mL/min — ABNORMAL LOW (ref >60)
Glucose, Bld: 156 mg/dL — ABNORMAL HIGH (ref 70–140)
POTASSIUM: 3.4 mmol/L — AB (ref 3.5–5.1)
SODIUM: 140 mmol/L (ref 136–145)
Total Bilirubin: 0.4 mg/dL (ref 0.2–1.2)
Total Protein: 6.4 g/dL (ref 6.4–8.3)

## 2017-12-14 LAB — CBC WITH DIFFERENTIAL (CANCER CENTER ONLY)
Basophils Absolute: 0 10*3/uL (ref 0.0–0.1)
Basophils Relative: 1 %
Eosinophils Absolute: 1.4 10*3/uL — ABNORMAL HIGH (ref 0.0–0.5)
Eosinophils Relative: 28 %
HCT: 31 % — ABNORMAL LOW (ref 38.4–49.9)
Hemoglobin: 10.1 g/dL — ABNORMAL LOW (ref 13.0–17.1)
LYMPHS ABS: 1 10*3/uL (ref 0.9–3.3)
Lymphocytes Relative: 19 %
MCH: 32.8 pg (ref 27.2–33.4)
MCHC: 32.6 g/dL (ref 32.0–36.0)
MCV: 100.6 fL — ABNORMAL HIGH (ref 79.3–98.0)
Monocytes Absolute: 0.2 10*3/uL (ref 0.1–0.9)
Monocytes Relative: 4 %
Neutro Abs: 2.4 10*3/uL (ref 1.5–6.5)
Neutrophils Relative %: 48 %
PLATELETS: 164 10*3/uL (ref 140–400)
RBC: 3.08 MIL/uL — AB (ref 4.20–5.82)
RDW: 14 % (ref 11.0–14.6)
WBC: 5 10*3/uL (ref 4.0–10.3)

## 2017-12-14 LAB — LACTATE DEHYDROGENASE: LDH: 141 U/L (ref 125–245)

## 2017-12-14 MED ORDER — PROCHLORPERAZINE MALEATE 10 MG PO TABS
10.0000 mg | ORAL_TABLET | Freq: Once | ORAL | Status: AC
  Administered 2017-12-14: 10 mg via ORAL

## 2017-12-14 MED ORDER — BORTEZOMIB CHEMO SQ INJECTION 3.5 MG (2.5MG/ML)
1.3000 mg/m2 | Freq: Once | INTRAMUSCULAR | Status: AC
  Administered 2017-12-14: 2.25 mg via SUBCUTANEOUS
  Filled 2017-12-14: qty 0.9

## 2017-12-14 MED ORDER — PROCHLORPERAZINE MALEATE 10 MG PO TABS
ORAL_TABLET | ORAL | Status: AC
Start: 2017-12-14 — End: ?
  Filled 2017-12-14: qty 1

## 2017-12-14 NOTE — Progress Notes (Signed)
Per Dr. Irene Limbo ok to treat with Creatine of 1.60

## 2017-12-14 NOTE — Telephone Encounter (Signed)
Appointments scheduled AVS/Calendar printed per 6/7 los

## 2017-12-14 NOTE — Patient Instructions (Signed)
[facility] Discharge Instructions for Patients Receiving Chemotherapy  Today you received the following chemotherapy agents Velcade.  To help prevent nausea and vomiting after your treatment, we encourage you to take your nausea medication as directed.  If you develop nausea and vomiting that is not controlled by your nausea medication, call the clinic.   BELOW ARE SYMPTOMS THAT SHOULD BE REPORTED IMMEDIATELY:  *FEVER GREATER THAN 100.5 F  *CHILLS WITH OR WITHOUT FEVER  NAUSEA AND VOMITING THAT IS NOT CONTROLLED WITH YOUR NAUSEA MEDICATION  *UNUSUAL SHORTNESS OF BREATH  *UNUSUAL BRUISING OR BLEEDING  TENDERNESS IN MOUTH AND THROAT WITH OR WITHOUT PRESENCE OF ULCERS  *URINARY PROBLEMS  *BOWEL PROBLEMS  UNUSUAL RASH Items with * indicate a potential emergency and should be followed up as soon as possible.  Feel free to call the clinic should you have any questions or concerns. The clinic phone number is [phone].  Please show the CHEMO ALERT CARD at check-in to the Emergency Department and triage nurse.   

## 2017-12-15 LAB — BETA 2 MICROGLOBULIN, SERUM: Beta-2 Microglobulin: 2.4 mg/L (ref 0.6–2.4)

## 2017-12-19 ENCOUNTER — Emergency Department (HOSPITAL_COMMUNITY): Payer: Medicare HMO

## 2017-12-19 ENCOUNTER — Other Ambulatory Visit: Payer: Self-pay

## 2017-12-19 ENCOUNTER — Encounter (HOSPITAL_COMMUNITY): Payer: Self-pay | Admitting: Emergency Medicine

## 2017-12-19 ENCOUNTER — Emergency Department (HOSPITAL_COMMUNITY)
Admission: EM | Admit: 2017-12-19 | Discharge: 2017-12-19 | Disposition: A | Discharge status: Home / Self Care | Payer: Medicare HMO | Attending: Emergency Medicine | Admitting: Emergency Medicine

## 2017-12-19 DIAGNOSIS — M25461 Effusion, right knee: Secondary | ICD-10-CM | POA: Insufficient documentation

## 2017-12-19 DIAGNOSIS — Z79899 Other long term (current) drug therapy: Secondary | ICD-10-CM | POA: Insufficient documentation

## 2017-12-19 DIAGNOSIS — M25561 Pain in right knee: Secondary | ICD-10-CM | POA: Insufficient documentation

## 2017-12-19 DIAGNOSIS — F17210 Nicotine dependence, cigarettes, uncomplicated: Secondary | ICD-10-CM | POA: Insufficient documentation

## 2017-12-19 DIAGNOSIS — Z794 Long term (current) use of insulin: Secondary | ICD-10-CM | POA: Insufficient documentation

## 2017-12-19 DIAGNOSIS — E119 Type 2 diabetes mellitus without complications: Secondary | ICD-10-CM | POA: Insufficient documentation

## 2017-12-19 DIAGNOSIS — I129 Hypertensive chronic kidney disease with stage 1 through stage 4 chronic kidney disease, or unspecified chronic kidney disease: Secondary | ICD-10-CM | POA: Diagnosis not present

## 2017-12-19 DIAGNOSIS — N183 Chronic kidney disease, stage 3 (moderate): Secondary | ICD-10-CM | POA: Diagnosis not present

## 2017-12-19 DIAGNOSIS — G8929 Other chronic pain: Secondary | ICD-10-CM

## 2017-12-19 MED ORDER — ACETAMINOPHEN 325 MG PO TABS
650.0000 mg | ORAL_TABLET | Freq: Once | ORAL | Status: AC
  Administered 2017-12-19: 650 mg via ORAL
  Filled 2017-12-19: qty 2

## 2017-12-19 NOTE — ED Provider Notes (Signed)
[place] EMERGENCY DEPARTMENT Provider Note   CSN: [id number] Arrival date & time: 12/19/17  0750     History   Chief Complaint Chief Complaint  Patient presents with  . Leg Pain  . Knee Pain    HPI Robert Sparks is a 72 y.o. male with history of type 2 diabetes, chronic kidney disease, and multiple myeloma in remission with last chemotherapy 5 days ago.  Patient presents today complaining of 2 months of intermittent bilateral leg pain.  Patient states that pain comes and goes and is worse when standing after sitting for long periods of time.  Patient describes the pain as an ache.  Patient states that pain in right knee is the worst and caused prompted him to come in today, patient describes pain in right knee as 10/10 when it comes on.  Patient states that he has not taken any medications for his pain and that pain is not alleviated by anything but goes away on its own.  Patient denies any numbness, tingling, swelling, warmth from either extremity.  Patient states that he is constipated. Patient is ambulatory on his own and states that he walked in today from the bus.   Past Medical History:  Diagnosis Date  . Anemia   . Arthritis    shoulders,feet   . Cataract    per eye dr -has appt 5-11  . CKD (chronic kidney disease), stage III ([place])   . Elevated PSA   . History of ketoacidosis    03-29-2015   . History of sepsis    07-25-2013  non-compliant w/ medication  . Hyperlipidemia   . Hypertension   . Nocturia   . Peripheral neuropathy   . Prostate cancer ([place])   . Type 2 diabetes mellitus with insulin therapy [facility])     Patient Active Problem List   Diagnosis Date Noted  . Vitamin B12 deficiency 09/04/2017  . Pre-ulcerative corn or callous 04/12/2017  . Cataract of both eyes 04/12/2017  . Tobacco dependence 04/12/2017  . Hypokalemia 03/23/2017  . Multiple myeloma in remission ([place]) 02/23/2017  . Oral leukoplakia 02/09/2017  . Lightheadedness  11/10/2016  . Tobacco abuse 06/21/2016  . Hyperlipidemia 06/21/2016  . CKD (chronic kidney disease) stage 3, GFR 30-59 ml/min (HCC) 02/23/2016  . Malignant neoplasm of prostate ([place]) 02/21/2016  . Controlled type 2 diabetes mellitus with stage 3 chronic kidney disease, with long-term current use of insulin ([place]) 03/29/2015  . HTN (hypertension) 07/28/2013  . Anemia, chronic disease 07/26/2013  . ETOH abuse 07/25/2013    Past Surgical History:  Procedure Laterality Date  . CIRCUMCISION  1990's  . PROSTATE BIOPSY N/A 12/21/2015   Procedure: BIOPSY TRANSRECTAL ULTRASONIC PROSTATE (TUBP);  Surgeon: Festus Aloe, MD;  Location: [facility];  Service: Urology;  Laterality: N/A;        Home Medications    Prior to Admission medications   Medication Sig Start Date End Date Taking? Authorizing Provider  ACCU-CHEK AVIVA PLUS test strip 1 each by Other route See admin instructions. 08/24/17   [name], MD  acyclovir (ZOVIRAX) 400 MG tablet TAKE 1 TABLET BY MOUTH 2 TIMES DAILY. 11/12/17   [name], MD  amLODipine (NORVASC) 5 MG tablet Take 1 tablet (5 mg total) by mouth daily. 12/05/17   [name], MD  aspirin EC 81 MG tablet Take 1 tablet (81 mg total) by mouth daily. 10/02/16   Maren Reamer, MD  Cholecalciferol (VITAMIN D)  2000 units CAPS Take 2,000 Units by mouth daily.    [provider]  diclofenac sodium (VOLTAREN) 1 % GEL Apply 2 g topically 4 (four) times daily. 12/06/17   [name], MD  gabapentin (NEURONTIN) 300 MG capsule TAKE 1 CAPSULE BY MOUTH IN THE MORNING AND AT LUNCH, TAKE 2 CAPSULES BY MOUTH AT NIGHT. 09/12/17   [name], MD  glucose blood test strip See admin instructions. 01/03/17   [provider]  insulin aspart protamine - aspart (NOVOLOG 70/30 MIX) (70-30) 100 UNIT/ML FlexPen Inject 0.09 mLs (9 Units total) into the skin 2 (two) times daily with a meal. 09/04/17   [name], MD    lidocaine-prilocaine (EMLA) cream Apply to affected area once 03/08/17   Lebron Conners, Marinell Blight, MD  mometasone (ELOCON) 0.1 % cream Apply 1 application topically daily. 03/30/17   [name], MD  Multiple Vitamin (MULTIVITAMIN WITH MINERALS) TABS tablet Take 1 tablet by mouth daily. 11/12/16   Lorella Nimrod, MD  ondansetron (ZOFRAN) 8 MG tablet Take 1 tablet (8 mg total) by mouth 2 (two) times daily as needed (Nausea or vomiting). 03/08/17   [name], MD  pravastatin (PRAVACHOL) 20 MG tablet Take 1 tablet (20 mg total) by mouth daily. 12/05/17   [name], MD  prochlorperazine (COMPAZINE) 10 MG tablet Take 1 tablet (10 mg total) by mouth every 6 (six) hours as needed (Nausea or vomiting). 03/08/17   [name], MD  senna-docusate (SENOKOT-S) 8.6-50 MG tablet Take 2 tablets by mouth 2 (two) times daily. 11/11/16   Lorella Nimrod, MD    Family History Family History  Problem Relation Age of Onset  . Cancer Neg Hx   . Colon cancer Neg Hx   . Colon polyps Neg Hx   . Esophageal cancer Neg Hx   . Rectal cancer Neg Hx   . Stomach cancer Neg Hx     Social History Social History   Tobacco Use  . Smoking status: Current Every Day Smoker    Packs/day: 0.25    Years: 50.00    Pack years: 12.50    Types: Cigarettes  . Smokeless tobacco: Never Used  . Tobacco comment: 1 ppwk-4-5 a day   Substance Use Topics  . Alcohol use: Yes    Alcohol/week: 0.6 oz    Types: 1 Cans of beer per week    Comment: 1 every day-quit couple weeks ago  . Drug use: No     Allergies   Patient has no known allergies.   Review of Systems Review of Systems  Constitutional: Negative.  Negative for fever.  HENT: Negative.  Negative for rhinorrhea and sore throat.   Eyes: Negative.   Respiratory: Negative.  Negative for cough, chest tightness and shortness of breath.   Cardiovascular: Negative.  Negative for chest pain and leg swelling.  Gastrointestinal: Positive for constipation. Negative  for abdominal pain, diarrhea and vomiting.  Endocrine: Negative.   Genitourinary: Negative.  Negative for dysuria, flank pain, hematuria, penile pain, penile swelling, scrotal swelling, testicular pain and urgency.  Musculoskeletal: Positive for arthralgias. Negative for back pain, gait problem, joint swelling, neck pain and neck stiffness.       Bilateral leg pain  Skin: Negative.  Negative for rash and wound.  Neurological: Negative.  Negative for dizziness, weakness, light-headedness and headaches.  Hematological: Negative.   Psychiatric/Behavioral: Negative.      Physical Exam Updated Vital Signs BP 122/74 (BP Location: Right Arm)  Pulse 78   Temp 98.1 F (36.7 C) (Oral)   Resp 16   Ht _0  (1.626 m)   Wt 68.5 kg (151 lb)   SpO2 100%   BMI 25.92 kg/m   Physical Exam  Constitutional: He is oriented to person, place, and time. He appears well-developed and well-nourished. No distress.  HENT:  Head: Normocephalic and atraumatic.  Mouth/Throat: Oropharynx is clear and moist. No oropharyngeal exudate.  Neck: Normal range of motion. Neck supple.  Cardiovascular: Normal rate, regular rhythm, normal heart sounds and intact distal pulses. Exam reveals no gallop and no friction rub.  No murmur heard. Pulses:      Dorsalis pedis pulses are 2+ on the right side, and 2+ on the left side.       Posterior tibial pulses are 2+ on the right side, and 2+ on the left side.  Pulmonary/Chest: Effort normal and breath sounds normal. No stridor. No respiratory distress. He has no wheezes. He has no rales.  Abdominal: Soft. Bowel sounds are normal. There is no tenderness. There is no rebound and no guarding.  Musculoskeletal: Normal range of motion.       Right knee: He exhibits normal range of motion, no swelling, no effusion, no ecchymosis, no deformity, no laceration, no erythema, normal alignment, no LCL laxity, normal patellar mobility, no bony tenderness, normal meniscus and no MCL laxity.  Tenderness found. Patellar tendon tenderness noted. No medial joint line, no lateral joint line, no MCL and no LCL tenderness noted.       Left knee: Normal. He exhibits normal range of motion, no swelling, no effusion, no ecchymosis, no deformity, no laceration, no erythema, normal alignment, no LCL laxity, normal patellar mobility, no bony tenderness, normal meniscus and no MCL laxity. No tenderness found. No medial joint line, no lateral joint line, no MCL, no LCL and no patellar tendon tenderness noted.       Right ankle: Normal.       Left ankle: Normal.       Right foot: There is normal range of motion and no deformity.       Left foot: There is normal range of motion and no deformity.       Feet:  Feet:  Right Foot:  Protective Sensation: 4 sites tested. 4 sites sensed.  Skin Integrity: Positive for callus. Negative for ulcer, blister, skin breakdown, erythema or warmth.  Left Foot:  Protective Sensation: 4 sites tested. 4 sites sensed.  Skin Integrity: Negative for ulcer, blister, skin breakdown, erythema, warmth or callus.  Neurological: He is alert and oriented to person, place, and time.  Skin: Skin is warm and dry. Capillary refill takes less than 2 seconds. No rash noted. He is not diaphoretic. No erythema.  Psychiatric: He has a normal mood and affect. His behavior is normal.     ED Treatments / Results  Labs (all labs ordered are listed, but only abnormal results are displayed) Labs Reviewed - No data to display  EKG None  Radiology Dg Knee Complete 4 Views Right  Result Date: 12/19/2017 CLINICAL DATA:  72 year old male with right knee pain for 1 month with no known injury. EXAM: RIGHT KNEE - COMPLETE 4+ VIEW COMPARISON:  None. FINDINGS: Bone mineralization is within normal limits for age. Small suprapatellar effusion suspected on the cross-table lateral view. The patella is intact. There is mild for age medial compartment joint space loss. No fracture, dislocation, or  other osseous abnormality identified. IMPRESSION: Small joint effusion  and medial compartment joint space loss with no acute osseous abnormality identified. Electronically Signed   By: Genevie Ann M.D.   On: 12/19/2017 12:27    Procedures Procedures (including critical care time)  Medications Ordered in ED Medications  acetaminophen (TYLENOL) tablet 650 mg (650 mg Oral Given 12/19/17 1311)     Initial Impression / Assessment and Plan / ED Course  I have reviewed the triage vital signs and the nursing notes.  Pertinent labs & imaging results that were available during my care of the patient were reviewed by me and considered in my medical decision making (see chart for details).  Knee pain - imaging found a Small joint effusion and medial compartment joint space loss with no acute osseous abnormality identified.  Unlikely infectious etiologies due to lack of fever, no erythema, swelling, tenderness to palpation of knees, calves, ankles, or feet bilaterally. No sign of DVT, no calf swelling, no cords, negative Homans sign.  Pedal pulses 2+ bilaterally.  Range of motion, sensation and strength intact bilaterally to both knees and ankles.  No history of trauma.  Physical exam is reassuring the pain is nonemergent.  Plan to treat pain with acetaminophen, CMP performed 5 days ago shows normal liver enzyme levels.  Patient unable to take NSAIDs due to CKD. Do not feel that narcotics are a good choice for treatment of knee pain due to patient's age and history of constipation.  Patient given knee sleeve in emergency department.  Patient given referral to Dr. Martyn Malay med.  Precautions discussed with patient at length. Patient given return precautions in AVS. Patient states understanding of return precautions.  Final Clinical Impressions(s) / ED Diagnoses   Final diagnoses:  Chronic pain of right knee  Effusion of right knee    ED Discharge Orders    None       Gari Crown 12/19/17 1402    Sherwood Gambler, MD 12/19/17 1627

## 2017-12-19 NOTE — Discharge Instructions (Addendum)
Return to the Emergency Department if: Your knee feels warm. You cannot move your knee. You have very bad knee pain. You have chest pain. You have trouble breathing.

## 2017-12-19 NOTE — ED Notes (Signed)
Pt verbalized understanding of d/c instructions and has no further questions, VSS, NAD.  

## 2017-12-19 NOTE — ED Triage Notes (Signed)
Pt. Stated, My knees and my legs have been hurting for about a month. I went to my Dr. Hanley Seamen me something to rub on them but not helping.

## 2017-12-21 MED FILL — NOVOLOG MIX 70-30 FLEXPEN S: (70-30) 100 | 32 days supply | Qty: 6 | Fill #3

## 2017-12-21 MED FILL — PRAVASTATIN SODIUM 20 MG TA: 20 | 30 days supply | Qty: 30 | Fill #0

## 2017-12-27 ENCOUNTER — Other Ambulatory Visit: Payer: Self-pay

## 2017-12-27 DIAGNOSIS — N183 Chronic kidney disease, stage 3 unspecified: Secondary | ICD-10-CM

## 2017-12-27 DIAGNOSIS — M65351 Trigger finger, right little finger: Secondary | ICD-10-CM | POA: Diagnosis not present

## 2017-12-27 DIAGNOSIS — M65341 Trigger finger, right ring finger: Secondary | ICD-10-CM | POA: Diagnosis not present

## 2017-12-28 ENCOUNTER — Inpatient Hospital Stay: Payer: Medicare HMO

## 2017-12-28 ENCOUNTER — Other Ambulatory Visit: Payer: Self-pay | Admitting: Hematology

## 2017-12-28 VITALS — BP 156/80 | HR 77 | Temp 98.1°F | Resp 16

## 2017-12-28 DIAGNOSIS — N183 Chronic kidney disease, stage 3 unspecified: Secondary | ICD-10-CM

## 2017-12-28 DIAGNOSIS — C9001 Multiple myeloma in remission: Secondary | ICD-10-CM | POA: Diagnosis not present

## 2017-12-28 DIAGNOSIS — Z5112 Encounter for antineoplastic immunotherapy: Secondary | ICD-10-CM | POA: Diagnosis not present

## 2017-12-28 LAB — CMP (CANCER CENTER ONLY)
ALK PHOS: 61 U/L (ref 40–150)
ALT: 19 U/L (ref 0–55)
ANION GAP: 11 (ref 3–11)
AST: 24 U/L (ref 5–34)
Albumin: 4.3 g/dL (ref 3.5–5.0)
BUN: 30 mg/dL — AB (ref 7–26)
CO2: 22 mmol/L (ref 22–29)
CREATININE: 1.7 mg/dL — AB (ref 0.70–1.30)
Calcium: 10 mg/dL (ref 8.4–10.4)
Chloride: 106 mmol/L (ref 98–109)
GFR, Est AFR Am: 46 mL/min — ABNORMAL LOW (ref >60)
GFR, Estimated: 39 mL/min — ABNORMAL LOW (ref >60)
Glucose, Bld: 81 mg/dL (ref 70–140)
Potassium: 3.9 mmol/L (ref 3.5–5.1)
SODIUM: 139 mmol/L (ref 136–145)
TOTAL PROTEIN: 7.2 g/dL (ref 6.4–8.3)
Total Bilirubin: 0.5 mg/dL (ref 0.2–1.2)

## 2017-12-28 LAB — CBC WITH DIFFERENTIAL (CANCER CENTER ONLY)
BASOS ABS: 0 10*3/uL (ref 0.0–0.1)
BASOS PCT: 0 %
Eosinophils Absolute: 0.3 10*3/uL (ref 0.0–0.5)
Eosinophils Relative: 5 %
HEMATOCRIT: 31 % — AB (ref 38.4–49.9)
HEMOGLOBIN: 10.3 g/dL — AB (ref 13.0–17.1)
Lymphocytes Relative: 15 %
Lymphs Abs: 0.8 10*3/uL — ABNORMAL LOW (ref 0.9–3.3)
MCH: 33.3 pg (ref 27.2–33.4)
MCHC: 33.2 g/dL (ref 32.0–36.0)
MCV: 100.3 fL — ABNORMAL HIGH (ref 79.3–98.0)
Monocytes Absolute: 0.3 10*3/uL (ref 0.1–0.9)
Monocytes Relative: 7 %
NEUTROS ABS: 3.7 10*3/uL (ref 1.5–6.5)
NEUTROS PCT: 73 %
Platelet Count: 220 10*3/uL (ref 140–400)
RBC: 3.09 MIL/uL — ABNORMAL LOW (ref 4.20–5.82)
RDW: 14.2 % (ref 11.0–14.6)
WBC Count: 5.1 10*3/uL (ref 4.0–10.3)

## 2017-12-28 LAB — LACTATE DEHYDROGENASE: LDH: 155 U/L (ref 125–245)

## 2017-12-28 MED ORDER — PROCHLORPERAZINE MALEATE 10 MG PO TABS
ORAL_TABLET | ORAL | Status: AC
  Filled 2017-12-28: qty 1

## 2017-12-28 MED ORDER — BORTEZOMIB CHEMO SQ INJECTION 3.5 MG (2.5MG/ML)
1.3000 mg/m2 | Freq: Once | INTRAMUSCULAR | Status: AC
  Administered 2017-12-28: 2.25 mg via SUBCUTANEOUS
  Filled 2017-12-28: qty 0.9

## 2017-12-28 MED ORDER — PROCHLORPERAZINE MALEATE 10 MG PO TABS
10.0000 mg | ORAL_TABLET | Freq: Once | ORAL | Status: AC
  Administered 2017-12-28: 10 mg via ORAL

## 2017-12-28 NOTE — Patient Instructions (Signed)
[facility] Discharge Instructions for Patients Receiving Chemotherapy  Today you received the following chemotherapy agents Velcade.  To help prevent nausea and vomiting after your treatment, we encourage you to take your nausea medication as directed.  If you develop nausea and vomiting that is not controlled by your nausea medication, call the clinic.   BELOW ARE SYMPTOMS THAT SHOULD BE REPORTED IMMEDIATELY:  *FEVER GREATER THAN 100.5 F  *CHILLS WITH OR WITHOUT FEVER  NAUSEA AND VOMITING THAT IS NOT CONTROLLED WITH YOUR NAUSEA MEDICATION  *UNUSUAL SHORTNESS OF BREATH  *UNUSUAL BRUISING OR BLEEDING  TENDERNESS IN MOUTH AND THROAT WITH OR WITHOUT PRESENCE OF ULCERS  *URINARY PROBLEMS  *BOWEL PROBLEMS  UNUSUAL RASH Items with * indicate a potential emergency and should be followed up as soon as possible.  Feel free to call the clinic should you have any questions or concerns. The clinic phone number is [phone].  Please show the CHEMO ALERT CARD at check-in to the Emergency Department and triage nurse.   

## 2017-12-28 NOTE — Progress Notes (Signed)
Per Dr. Irene Limbo it is ok to treat with creatinine of 1.70

## 2017-12-31 LAB — BETA 2 MICROGLOBULIN, SERUM: Beta-2 Microglobulin: 2.9 mg/L — ABNORMAL HIGH (ref 0.6–2.4)

## 2018-01-04 DIAGNOSIS — M25561 Pain in right knee: Secondary | ICD-10-CM | POA: Diagnosis not present

## 2018-01-04 DIAGNOSIS — M25562 Pain in left knee: Secondary | ICD-10-CM | POA: Diagnosis not present

## 2018-01-04 DIAGNOSIS — M1711 Unilateral primary osteoarthritis, right knee: Secondary | ICD-10-CM | POA: Diagnosis not present

## 2018-01-07 ENCOUNTER — Encounter: Payer: Self-pay | Admitting: Internal Medicine

## 2018-01-07 ENCOUNTER — Ambulatory Visit: Payer: Medicare HMO | Attending: Internal Medicine | Admitting: Internal Medicine

## 2018-01-07 VITALS — BP 121/74 | HR 72 | Temp 98.5°F | Resp 16 | Wt 148.8 lb

## 2018-01-07 DIAGNOSIS — Z7982 Long term (current) use of aspirin: Secondary | ICD-10-CM | POA: Insufficient documentation

## 2018-01-07 DIAGNOSIS — E1136 Type 2 diabetes mellitus with diabetic cataract: Secondary | ICD-10-CM | POA: Insufficient documentation

## 2018-01-07 DIAGNOSIS — M1711 Unilateral primary osteoarthritis, right knee: Secondary | ICD-10-CM | POA: Insufficient documentation

## 2018-01-07 DIAGNOSIS — N183 Chronic kidney disease, stage 3 (moderate): Secondary | ICD-10-CM | POA: Insufficient documentation

## 2018-01-07 DIAGNOSIS — E1142 Type 2 diabetes mellitus with diabetic polyneuropathy: Secondary | ICD-10-CM | POA: Diagnosis not present

## 2018-01-07 DIAGNOSIS — E785 Hyperlipidemia, unspecified: Secondary | ICD-10-CM | POA: Insufficient documentation

## 2018-01-07 DIAGNOSIS — E538 Deficiency of other specified B group vitamins: Secondary | ICD-10-CM | POA: Insufficient documentation

## 2018-01-07 DIAGNOSIS — Z9221 Personal history of antineoplastic chemotherapy: Secondary | ICD-10-CM | POA: Diagnosis not present

## 2018-01-07 DIAGNOSIS — I129 Hypertensive chronic kidney disease with stage 1 through stage 4 chronic kidney disease, or unspecified chronic kidney disease: Secondary | ICD-10-CM | POA: Insufficient documentation

## 2018-01-07 DIAGNOSIS — E876 Hypokalemia: Secondary | ICD-10-CM | POA: Diagnosis not present

## 2018-01-07 DIAGNOSIS — C9001 Multiple myeloma in remission: Secondary | ICD-10-CM | POA: Diagnosis not present

## 2018-01-07 DIAGNOSIS — F17210 Nicotine dependence, cigarettes, uncomplicated: Secondary | ICD-10-CM | POA: Diagnosis not present

## 2018-01-07 DIAGNOSIS — D638 Anemia in other chronic diseases classified elsewhere: Secondary | ICD-10-CM | POA: Diagnosis not present

## 2018-01-07 DIAGNOSIS — C61 Malignant neoplasm of prostate: Secondary | ICD-10-CM | POA: Diagnosis not present

## 2018-01-07 DIAGNOSIS — L84 Corns and callosities: Secondary | ICD-10-CM | POA: Diagnosis not present

## 2018-01-07 DIAGNOSIS — E1122 Type 2 diabetes mellitus with diabetic chronic kidney disease: Secondary | ICD-10-CM | POA: Diagnosis not present

## 2018-01-07 DIAGNOSIS — Z794 Long term (current) use of insulin: Secondary | ICD-10-CM | POA: Diagnosis not present

## 2018-01-07 DIAGNOSIS — E119 Type 2 diabetes mellitus without complications: Secondary | ICD-10-CM | POA: Diagnosis present

## 2018-01-07 DIAGNOSIS — Z79899 Other long term (current) drug therapy: Secondary | ICD-10-CM | POA: Diagnosis not present

## 2018-01-07 LAB — GLUCOSE, POCT (MANUAL RESULT ENTRY): POC Glucose: 136 mg/dl — AB (ref 70–99)

## 2018-01-07 MED ORDER — DULOXETINE HCL 20 MG PO CPEP: 20.0000 mg | ORAL_CAPSULE | Freq: Every day | ORAL | 3 refills | Status: DC

## 2018-01-07 MED FILL — GABAPENTIN 300 MG CAPSULE: 300 | 30 days supply | Qty: 120 | Fill #3

## 2018-01-07 MED FILL — ACCU-CHEK AVIVA PLUS TEST S: 30 days supply | Qty: 100 | Fill #4

## 2018-01-07 MED FILL — DULoxetine HCL 20 MG CPEP: 20 | 30 days supply | Qty: 30 | Fill #0

## 2018-01-07 NOTE — Progress Notes (Signed)
Patient ID: Robert Sparks, male    DOB: 11/06/1945  MRN: [id number]  CC: Diabetes   Subjective: Robert Sparks is a 72 y.o. male who presents for 1 mth f/u and ER f/u His concerns today include:  Patient with history of HTN, diabetes type 2,HL,CKD stage3, tobacco dependence, EtOH abuse, prostate CA, vit B 12 def and multiple myeloma .  Since last visit with me he has gained 7 pounds back.  He reports that his eating is stable. On last visit he complained of legs being very sensitive to touch and soreness in the legs.   He is on a statin.  TSH was normal.  CK was normal. He also complained of aching in the knees and was assessed to have osteoarthritis.  Given Voltaren gel which he has not found helpful.    Seen in ED for RT knee pain 6/12.  X-ray revealed osteoarthritis changes and joint effusion. Seen by ortho 3 days ago for same.  Given what sounds like steroid injection which has helped some Legs still very sensitive to touch RT>LT and sharp pains in both feet.  He has to wear double socks on both feet just to make his feet feel comfortable. Has a pair of DM shoes but does not use consistently Takes Gabapentin consistently.  Patient Active Problem List   Diagnosis Date Noted  . Vitamin B12 deficiency 09/04/2017  . Pre-ulcerative corn or callous 04/12/2017  . Cataract of both eyes 04/12/2017  . Tobacco dependence 04/12/2017  . Hypokalemia 03/23/2017  . Multiple myeloma in remission ([place]) 02/23/2017  . Oral leukoplakia 02/09/2017  . Lightheadedness 11/10/2016  . Tobacco abuse 06/21/2016  . Hyperlipidemia 06/21/2016  . CKD (chronic kidney disease) stage 3, GFR 30-59 ml/min (HCC) 02/23/2016  . Malignant neoplasm of prostate ([place]) 02/21/2016  . Controlled type 2 diabetes mellitus with stage 3 chronic kidney disease, with long-term current use of insulin ([place]) 03/29/2015  . HTN (hypertension) 07/28/2013  . Anemia, chronic disease 07/26/2013  . ETOH abuse 07/25/2013      Current Outpatient Medications on File Prior to Visit  Medication Sig Dispense Refill  . ACCU-CHEK AVIVA PLUS test strip 1 each by Other route See admin instructions. 100 each 12  . acyclovir (ZOVIRAX) 400 MG tablet TAKE 1 TABLET BY MOUTH 2 TIMES DAILY. 60 tablet 3  . amLODipine (NORVASC) 5 MG tablet Take 1 tablet (5 mg total) by mouth daily. 90 tablet 3  . aspirin EC 81 MG tablet Take 1 tablet (81 mg total) by mouth daily. 90 tablet 3  . Cholecalciferol (VITAMIN D) 2000 units CAPS Take 2,000 Units by mouth daily.    . diclofenac sodium (VOLTAREN) 1 % GEL Apply 2 g topically 4 (four) times daily. 100 g 2  . gabapentin (NEURONTIN) 300 MG capsule TAKE 1 CAPSULE BY MOUTH IN THE MORNING AND AT LUNCH, TAKE 2 CAPSULES BY MOUTH AT NIGHT. 120 capsule 4  . glucose blood test strip See admin instructions.    . insulin aspart protamine - aspart (NOVOLOG 70/30 MIX) (70-30) 100 UNIT/ML FlexPen Inject 0.09 mLs (9 Units total) into the skin 2 (two) times daily with a meal. 15 mL 6  . lidocaine-prilocaine (EMLA) cream Apply to affected area once 30 g 3  . mometasone (ELOCON) 0.1 % cream Apply 1 application topically daily. 45 g 3  . Multiple Vitamin (MULTIVITAMIN WITH MINERALS) TABS tablet Take 1 tablet by mouth daily. 60 tablet 2  . ondansetron (ZOFRAN) 8 MG tablet Take  1 tablet (8 mg total) by mouth 2 (two) times daily as needed (Nausea or vomiting). 30 tablet 1  . pravastatin (PRAVACHOL) 20 MG tablet Take 1 tablet (20 mg total) by mouth daily. 90 tablet 3  . prochlorperazine (COMPAZINE) 10 MG tablet Take 1 tablet (10 mg total) by mouth every 6 (six) hours as needed (Nausea or vomiting). 30 tablet 1  . senna-docusate (SENOKOT-S) 8.6-50 MG tablet Take 2 tablets by mouth 2 (two) times daily. 60 tablet 2   Current Facility-Administered Medications on File Prior to Visit  Medication Dose Route Frequency Provider Last Rate Last Dose  . 0.9 %  sodium chloride infusion  500 mL Intravenous Continuous Pyrtle, Lajuan Lines, MD        No Known Allergies  Social History   Socioeconomic History  . Marital status: Single    Spouse name: Not on file  . Number of children: Not on file  . Years of education: Not on file  . Highest education level: Not on file  Occupational History  . Not on file  Social Needs  . Financial resource strain: Not on file  . Food insecurity:    Worry: Not on file    Inability: Not on file  . Transportation needs:    Medical: Not on file    Non-medical: Not on file  Tobacco Use  . Smoking status: Current Every Day Smoker    Packs/day: 0.25    Years: 50.00    Pack years: 12.50    Types: Cigarettes  . Smokeless tobacco: Never Used  . Tobacco comment: 1 ppwk-4-5 a day   Substance and Sexual Activity  . Alcohol use: Yes    Alcohol/week: 0.6 oz    Types: 1 Cans of beer per week    Comment: 1 every day-quit couple weeks ago  . Drug use: No  . Sexual activity: Not Currently  Lifestyle  . Physical activity:    Days per week: Not on file    Minutes per session: Not on file  . Stress: Not on file  Relationships  . Social connections:    Talks on phone: Not on file    Gets together: Not on file    Attends religious service: Not on file    Active member of club or organization: Not on file    Attends meetings of clubs or organizations: Not on file    Relationship status: Not on file  . Intimate partner violence:    Fear of current or ex partner: Not on file    Emotionally abused: Not on file    Physically abused: Not on file    Forced sexual activity: Not on file  Other Topics Concern  . Not on file  Social History Narrative  . Not on file    Family History  Problem Relation Age of Onset  . Cancer Neg Hx   . Colon cancer Neg Hx   . Colon polyps Neg Hx   . Esophageal cancer Neg Hx   . Rectal cancer Neg Hx   . Stomach cancer Neg Hx     Past Surgical History:  Procedure Laterality Date  . CIRCUMCISION  1990's  . PROSTATE BIOPSY N/A 12/21/2015    Procedure: BIOPSY TRANSRECTAL ULTRASONIC PROSTATE (TUBP);  Surgeon: Festus Aloe, MD;  Location: [facility];  Service: Urology;  Laterality: N/A;    ROS: Review of Systems Negative except as stated above. PHYSICAL EXAM: BP 121/74   Pulse 72  Temp 98.5 F (36.9 C) (Oral)   Resp 16   Wt 148 lb 12.8 oz (67.5 kg)   SpO2 100%   BMI 25.54 kg/m   Wt Readings from Last 3 Encounters:  01/07/18 148 lb 12.8 oz (67.5 kg)  12/19/17 151 lb (68.5 kg)  12/06/17 141 lb 12.8 oz (64.3 kg)    Physical Exam General appearance - alert, well appearing, and in no distress Mental status -patient is oriented.  He follows commands appropriately.  Answers questions appropriately. Neurological -power in both lower extremities 5/5 proximally and distally.  No significant muscle wasting noted in the lower extremities. Diabetic Foot Exam - Simple   Simple Foot Form Visual Inspection See comments:  Yes Sensation Testing See comments:  Yes Pulse Check Posterior Tibialis and Dorsalis pulse intact bilaterally:  Yes Comments Large callus on the ball of the right foot at the base between the first and second toe.  Also has a callus on the medial aspect of the left first toe.     Lab Results  Component Value Date   WBC 5.1 12/28/2017   HGB 10.3 (L) 12/28/2017   HCT 31.0 (L) 12/28/2017   MCV 100.3 (H) 12/28/2017   PLT 220 12/28/2017     Chemistry      Component Value Date/Time   NA 139 12/28/2017 0811   NA 138 12/06/2017 0920   NA 137 07/13/2017 0938   K 3.9 12/28/2017 0811   K 3.3 (L) 07/13/2017 0938   CL 106 12/28/2017 0811   CO2 22 12/28/2017 0811   CO2 25 07/13/2017 0938   BUN 30 (H) 12/28/2017 0811   BUN 42 (H) 12/06/2017 0920   BUN 23.8 07/13/2017 0938   CREATININE 1.70 (H) 12/28/2017 0811   CREATININE 1.7 (H) 07/13/2017 0938      Component Value Date/Time   CALCIUM 10.0 12/28/2017 0811   CALCIUM 9.3 07/13/2017 0938   ALKPHOS 61 12/28/2017 0811   ALKPHOS 119  07/13/2017 0938   AST 24 12/28/2017 0811   AST 14 07/13/2017 0938   ALT 19 12/28/2017 0811   ALT 13 07/13/2017 0938   BILITOT 0.5 12/28/2017 0811   BILITOT 0.45 07/13/2017 0938     Results for orders placed or performed in visit on 01/07/18  POCT glucose (manual entry)  Result Value Ref Range   POC Glucose 136 (A) 70 - 99 mg/dl    ASSESSMENT AND PLAN: 1. Diabetic polyneuropathy associated with type 2 diabetes mellitus (HCC) This neuropathy can be due to combination of diabetes and previous chemo He is on gabapentin.  He is agreeable to [place] adding low-dose of Cymbalta which will also help with neuropathic symptoms and pain from osteoarthritis of the knees. - DULoxetine (CYMBALTA) 20 MG capsule; Take 1 capsule (20 mg total) by mouth daily.  Dispense: 30 capsule; Refill: 3  2. Primary osteoarthritis of right knee - DULoxetine (CYMBALTA) 20 MG capsule; Take 1 capsule (20 mg total) by mouth daily.  Dispense: 30 capsule; Refill: 3  3. Pre-ulcerative corn or callous Patient is followed by podiatry who he sees every 3 months to have the nails clipped and calluses shaved.  Encouraged him to wear his diabetic shoes  4. Controlled type 2 diabetes mellitus with stage 3 chronic kidney disease, with long-term current use of insulin (HCC) - POCT glucose (manual entry)    Patient was given the opportunity to ask questions.  Patient verbalized understanding of the plan and was able to repeat key elements of the  plan.   Orders Placed This Encounter  Procedures  . POCT glucose (manual entry)     Requested Prescriptions   Signed Prescriptions Disp Refills  . DULoxetine (CYMBALTA) 20 MG capsule 30 capsule 3    Sig: Take 1 capsule (20 mg total) by mouth daily.    Return in about 3 months (around 04/09/2018).  Karle Plumber, MD, FACP

## 2018-01-07 NOTE — Patient Instructions (Signed)
Start Cymbalta 20 mg daily to help with neuropathy in your legs and feet and for arthritis in the knee.

## 2018-01-07 NOTE — Progress Notes (Signed)
Pt states his b/l legs are still bothering him and his knee

## 2018-01-08 ENCOUNTER — Other Ambulatory Visit: Payer: Self-pay

## 2018-01-08 DIAGNOSIS — C9001 Multiple myeloma in remission: Secondary | ICD-10-CM

## 2018-01-09 ENCOUNTER — Inpatient Hospital Stay (HOSPITAL_BASED_OUTPATIENT_CLINIC_OR_DEPARTMENT_OTHER): Payer: Medicare HMO | Admitting: Hematology

## 2018-01-09 ENCOUNTER — Inpatient Hospital Stay: Payer: Medicare HMO | Attending: Hematology and Oncology

## 2018-01-09 ENCOUNTER — Inpatient Hospital Stay: Payer: Medicare HMO

## 2018-01-09 ENCOUNTER — Other Ambulatory Visit: Payer: Medicare HMO

## 2018-01-09 VITALS — BP 145/76 | HR 71 | Temp 98.2°F | Resp 18

## 2018-01-09 DIAGNOSIS — C9001 Multiple myeloma in remission: Secondary | ICD-10-CM

## 2018-01-09 DIAGNOSIS — R21 Rash and other nonspecific skin eruption: Secondary | ICD-10-CM | POA: Insufficient documentation

## 2018-01-09 DIAGNOSIS — I129 Hypertensive chronic kidney disease with stage 1 through stage 4 chronic kidney disease, or unspecified chronic kidney disease: Secondary | ICD-10-CM | POA: Diagnosis not present

## 2018-01-09 DIAGNOSIS — E1140 Type 2 diabetes mellitus with diabetic neuropathy, unspecified: Secondary | ICD-10-CM

## 2018-01-09 DIAGNOSIS — N183 Chronic kidney disease, stage 3 (moderate): Secondary | ICD-10-CM

## 2018-01-09 DIAGNOSIS — D631 Anemia in chronic kidney disease: Secondary | ICD-10-CM

## 2018-01-09 DIAGNOSIS — C9000 Multiple myeloma not having achieved remission: Secondary | ICD-10-CM

## 2018-01-09 DIAGNOSIS — Z5112 Encounter for antineoplastic immunotherapy: Secondary | ICD-10-CM | POA: Insufficient documentation

## 2018-01-09 LAB — CBC WITH DIFFERENTIAL (CANCER CENTER ONLY)
Basophils Absolute: 0 10*3/uL (ref 0.0–0.1)
Basophils Relative: 1 %
Eosinophils Absolute: 0.8 10*3/uL — ABNORMAL HIGH (ref 0.0–0.5)
Eosinophils Relative: 17 %
HEMATOCRIT: 30.1 % — AB (ref 38.4–49.9)
HEMOGLOBIN: 10.2 g/dL — AB (ref 13.0–17.1)
LYMPHS ABS: 0.8 10*3/uL — AB (ref 0.9–3.3)
LYMPHS PCT: 18 %
MCH: 34.2 pg — ABNORMAL HIGH (ref 27.2–33.4)
MCHC: 34.1 g/dL (ref 32.0–36.0)
MCV: 100.4 fL — AB (ref 79.3–98.0)
MONO ABS: 0.3 10*3/uL (ref 0.1–0.9)
MONOS PCT: 7 %
NEUTROS ABS: 2.6 10*3/uL (ref 1.5–6.5)
NEUTROS PCT: 57 %
Platelet Count: 206 10*3/uL (ref 140–400)
RBC: 2.99 MIL/uL — AB (ref 4.20–5.82)
RDW: 14.6 % (ref 11.0–14.6)
WBC Count: 4.7 10*3/uL (ref 4.0–10.3)

## 2018-01-09 LAB — CMP (CANCER CENTER ONLY)
ALT: 22 U/L (ref 0–44)
AST: 19 U/L (ref 15–41)
Albumin: 4.1 g/dL (ref 3.5–5.0)
Alkaline Phosphatase: 95 U/L (ref 38–126)
Anion gap: 7 (ref 5–15)
BUN: 25 mg/dL — ABNORMAL HIGH (ref 8–23)
CO2: 26 mmol/L (ref 22–32)
CREATININE: 1.44 mg/dL — AB (ref 0.61–1.24)
Calcium: 9.9 mg/dL (ref 8.9–10.3)
Chloride: 104 mmol/L (ref 98–111)
GFR, EST AFRICAN AMERICAN: 56 mL/min — AB (ref >60)
GFR, Estimated: 48 mL/min — ABNORMAL LOW (ref >60)
Glucose, Bld: 104 mg/dL — ABNORMAL HIGH (ref 70–99)
POTASSIUM: 3.6 mmol/L (ref 3.5–5.1)
SODIUM: 137 mmol/L (ref 135–145)
Total Bilirubin: 0.4 mg/dL (ref 0.3–1.2)
Total Protein: 6.6 g/dL (ref 6.5–8.1)

## 2018-01-09 LAB — LACTATE DEHYDROGENASE: LDH: 140 U/L (ref 98–192)

## 2018-01-09 LAB — MAGNESIUM: MAGNESIUM: 2.5 mg/dL — AB (ref 1.7–2.4)

## 2018-01-09 MED ORDER — PROCHLORPERAZINE MALEATE 10 MG PO TABS
ORAL_TABLET | ORAL | Status: AC
  Filled 2018-01-09: qty 1

## 2018-01-09 MED ORDER — PROCHLORPERAZINE MALEATE 10 MG PO TABS
10.0000 mg | ORAL_TABLET | Freq: Once | ORAL | Status: AC
  Administered 2018-01-09: 10 mg via ORAL

## 2018-01-09 MED ORDER — BORTEZOMIB CHEMO SQ INJECTION 3.5 MG (2.5MG/ML)
1.3000 mg/m2 | Freq: Once | INTRAMUSCULAR | Status: AC
  Administered 2018-01-09: 2.25 mg via SUBCUTANEOUS
  Filled 2018-01-09: qty 0.9

## 2018-01-09 NOTE — Progress Notes (Signed)
Per Dr. Irene Limbo, ok to proceed with treatment prior to MD visit in infusion.

## 2018-01-09 NOTE — Progress Notes (Signed)
HEMATOLOGY/ONCOLOGY CLINIC NOTE  Date of Service: 01/09/2018  Patient Care Team: [name], MD as PCP - General (Internal Medicine) [name], MD as Consulting Physician (Hematology and Oncology)  CHIEF COMPLAINTS/PURPOSE OF CONSULTATION:  F/u for continued mx of Multiple Myeloma  Oncologic History:  72 y.o. male with diagnosis of IgA lambda active multiple myeloma, ISS Stage I. Active disease diagnosed based on presence of anemia, kidney insufficiency, and paraproteinemia with significant predominance of lambda light chains as well as significant elevation of IgA. Bone marrow biopsy confirmed presence of atypical monoclonal plasma cell process in the bone marrow comprising at least 7% of the cellularity. Based on the findings, patient was started on treatment with lenalidomide, bortezomib, and low-dose dexamethasone based on the anticipated tolerance by the patient. Lenalidomide was started at 10 mg daily based on creatinine clearance of 42, dexamethasone dose was reduced to 20 mg weekly based on patient's age. Treatment was complicated by rapid development of cutaneous rash attributed to lenalidomide, inaddition to decreased renal function and now patient has persistent severe anemia. Side-effects were attributed to Lenalidomide and lenalidomide was discontinued with subsequent recovery.  Patient has been receiving bortezomib weekly with low-dose dexamethasone in 4-day cycles.  Patient has completed induction systemic therapy for his disease with repeat bone marrow biopsy confirming complete response including no evidence of minimal residual disease by cytogenetics or FISH.  HISTORY OF PRESENTING ILLNESS:   Robert Sparks is a wonderful 72 y.o. male who has been referred to [place] by my colleague Dr Robert Sparks for evaluation and management of Multiple Myeloma. The pt reports that he is doing well overall.   The pt has been previously followed by my colleague Dr Robert Sparks. The pt was treated with Revlimid, Velcade, and dexamethasone and was subsequently switched to Velcade and Dexamethasone after a Revlimid intolerance.   The pt reports some knee aches and skin soreness in his legs which is worse at night. He notes that he has had diabetic-related peripheral neuropathy. The pt has not seen [facility] yet for a discussion of a BM transplant. He continues taking Acyclovir and notes no difficulty taking maintenance Velcade.   He adds that he takes Gabapentin and sweats.  He notes that his neuropathy is currently stable and denies it worsening.   He notes that a pack of cigarettes lasts him for about 3 days.   Most recent lab results (12/14/17) of CBC and CMP  is as follows: all values are WNL except for RBC at 3.08, HGB at 10.1, HCT at 31.0, MCV at 100.6, Eosinophils Abs at 1.4k, Potassium at 3.4, Glucose at 156, Creatinine at 1.60. LDH 12/14/17 is 141 Beta 2 microglobulin 12/14/17 is 2.4  On review of systems, pt reports knee pain, night time LE skin soreness, peripheral neuropathy, eating well, strong appetite, intermittent sweating and denies fevers, chills, back pains, abdominal pains, and any other symptoms.   [name] DM type 2, HTN, CKD. On Social Hx the pt reports smoking a pack of cigarettes every 3 days.   [name] returns today regarding management and evaluation of his Multiple Myeloma. [name] was on 12/14/17. The pt reports that he is doing well overall.   The pt reports that his right knee has been recently evaluated with an XR as noted below. He notes that his neuropathy has not worsened but he has been recently placed on 27m Cymbalta.  He notes that he has had neuropathy in his feet for a while and that it hasn't changed.   Of note since the patient's last visit, pt has had Right Knee XR completed on 12/19/17 with results revealing Small joint effusion and medial compartment joint  space loss with no acute osseous abnormality identified.  Lab results today (01/09/18) of CBC w/diff, CMP, and Reticulocytes is as follows: all values are WNL except for RBC at 2.99, HGB at 10.2, HCT at 30.1, MCV at 100.4, MCH at 34.2, Lymphs abs at 800, Eosinophils abs at 800, Glucose at 104, BUN at 25, Creatinine at 1.44, GFR at 56. Magnesium 01/09/18 is elevated at 2.5 LDH 01/09/18 is 140  On review of systems, pt reports right leg pain, right knee pain, strong appetite, weight gain, and denies other bone pains, back pains, abdominal pains, leg swelling, and any other symptoms.   MEDICAL HISTORY:  Past Medical History:  Diagnosis Date  . Anemia   . Arthritis    shoulders,feet   . Cataract    per eye dr -has appt 5-11  . CKD (chronic kidney disease), stage III ([place])   . Elevated PSA   . History of ketoacidosis    03-29-2015   . History of sepsis    07-25-2013  non-compliant w/ medication  . Hyperlipidemia   . Hypertension   . Nocturia   . Peripheral neuropathy   . Prostate cancer ([place])   . Type 2 diabetes mellitus with insulin therapy ([place])     SURGICAL HISTORY: Past Surgical History:  Procedure Laterality Date  . CIRCUMCISION  1990's  . PROSTATE BIOPSY N/A 12/21/2015   Procedure: BIOPSY TRANSRECTAL ULTRASONIC PROSTATE (TUBP);  Surgeon: Festus Aloe, MD;  Location: [facility];  Service: Urology;  Laterality: N/A;    SOCIAL HISTORY: Social History   Socioeconomic History  . Marital status: Single    Spouse name: Not on file  . Number of children: Not on file  . Years of education: Not on file  . Highest education level: Not on file  Occupational History  . Not on file  Social Needs  . Financial resource strain: Not on file  . Food insecurity:    Worry: Not on file    Inability: Not on file  . Transportation needs:    Medical: Not on file    Non-medical: Not on file  Tobacco Use  . Smoking status: Current Every Day Smoker    Packs/day: 0.25     Years: 50.00    Pack years: 12.50    Types: Cigarettes  . Smokeless tobacco: Never Used  . Tobacco comment: 1 ppwk-4-5 a day   Substance and Sexual Activity  . Alcohol use: Yes    Alcohol/week: 0.6 oz    Types: 1 Cans of beer per week    Comment: 1 every day-quit couple weeks ago  . Drug use: No  . Sexual activity: Not Currently  Lifestyle  . Physical activity:    Days per week: Not on file    Minutes per session: Not on file  . Stress: Not on file  Relationships  . Social connections:    Talks on phone: Not on file    Gets together: Not on file    Attends religious service: Not on file    Active member of club or organization: Not on file    Attends meetings of clubs or organizations: Not on file    Relationship status: Not on file  .  Intimate partner violence:    Fear of current or ex partner: Not on file    Emotionally abused: Not on file    Physically abused: Not on file    Forced sexual activity: Not on file  Other Topics Concern  . Not on file  Social History Narrative  . Not on file    FAMILY HISTORY: Family History  Problem Relation Age of Onset  . Cancer Neg Hx   . Colon cancer Neg Hx   . Colon polyps Neg Hx   . Esophageal cancer Neg Hx   . Rectal cancer Neg Hx   . Stomach cancer Neg Hx     ALLERGIES:  has No Known Allergies.  MEDICATIONS:  Current Outpatient Medications  Medication Sig Dispense Refill  . ACCU-CHEK AVIVA PLUS test strip 1 each by Other route See admin instructions. 100 each 12  . acyclovir (ZOVIRAX) 400 MG tablet TAKE 1 TABLET BY MOUTH 2 TIMES DAILY. 60 tablet 3  . amLODipine (NORVASC) 5 MG tablet Take 1 tablet (5 mg total) by mouth daily. 90 tablet 3  . aspirin EC 81 MG tablet Take 1 tablet (81 mg total) by mouth daily. 90 tablet 3  . Cholecalciferol (VITAMIN D) 2000 units CAPS Take 2,000 Units by mouth daily.    . diclofenac sodium (VOLTAREN) 1 % GEL Apply 2 g topically 4 (four) times daily. 100 g 2  . DULoxetine (CYMBALTA) 20 MG  capsule Take 1 capsule (20 mg total) by mouth daily. 30 capsule 3  . gabapentin (NEURONTIN) 300 MG capsule TAKE 1 CAPSULE BY MOUTH IN THE MORNING AND AT LUNCH, TAKE 2 CAPSULES BY MOUTH AT NIGHT. 120 capsule 4  . glucose blood test strip See admin instructions.    . insulin aspart protamine - aspart (NOVOLOG 70/30 MIX) (70-30) 100 UNIT/ML FlexPen Inject 0.09 mLs (9 Units total) into the skin 2 (two) times daily with a meal. 15 mL 6  . lidocaine-prilocaine (EMLA) cream Apply to affected area once 30 g 3  . mometasone (ELOCON) 0.1 % cream Apply 1 application topically daily. 45 g 3  . Multiple Vitamin (MULTIVITAMIN WITH MINERALS) TABS tablet Take 1 tablet by mouth daily. 60 tablet 2  . ondansetron (ZOFRAN) 8 MG tablet Take 1 tablet (8 mg total) by mouth 2 (two) times daily as needed (Nausea or vomiting). 30 tablet 1  . pravastatin (PRAVACHOL) 20 MG tablet Take 1 tablet (20 mg total) by mouth daily. 90 tablet 3  . prochlorperazine (COMPAZINE) 10 MG tablet Take 1 tablet (10 mg total) by mouth every 6 (six) hours as needed (Nausea or vomiting). 30 tablet 1  . senna-docusate (SENOKOT-S) 8.6-50 MG tablet Take 2 tablets by mouth 2 (two) times daily. 60 tablet 2   Current Facility-Administered Medications  Medication Dose Route Frequency Provider Last Rate Last Dose  . 0.9 %  sodium chloride infusion  500 mL Intravenous Continuous Pyrtle, Lajuan Lines, MD       Facility-Administered Medications Ordered in Other Visits  Medication Dose Route Frequency Provider Last Rate Last Dose  . bortezomib SQ (VELCADE) chemo injection 2.25 mg  1.3 mg/m2 (Treatment Plan Recorded) Subcutaneous Once Brunetta Genera, MD        REVIEW OF SYSTEMS:    A 10+ POINT REVIEW OF SYSTEMS WAS OBTAINED including neurology, dermatology, psychiatry, cardiac, respiratory, lymph, extremities, GI, GU, Musculoskeletal, constitutional, breasts, reproductive, HEENT.  All pertinent positives are noted in the HPI.  All others are negative.    PHYSICAL  EXAMINATION: ECOG PERFORMANCE STATUS: 1 - Symptomatic but completely ambulatory  GENERAL:alert, in no acute distress and comfortable SKIN: no acute rashes, no significant lesions EYES: conjunctiva are pink and non-injected, sclera anicteric OROPHARYNX: MMM, no exudates, no oropharyngeal erythema or ulceration NECK: supple, no JVD LYMPH:  no palpable lymphadenopathy in the cervical, axillary or inguinal regions LUNGS: clear to auscultation b/l with normal respiratory effort HEART: regular rate & rhythm ABDOMEN:  normoactive bowel sounds , non tender, not distended. No palpable hepatosplenomegaly.  Extremity: no pedal edema PSYCH: alert & oriented x 3 with fluent speech NEURO: no focal motor/sensory deficits   LABORATORY DATA:  I have reviewed the data as listed  . CBC Latest Ref Rng & Units 01/09/2018 12/28/2017 12/14/2017  WBC 4.0 - 10.3 K/uL 4.7 5.1 5.0  Hemoglobin 13.0 - 17.1 g/dL 10.2(L) 10.3(L) 10.1(L)  Hematocrit 38.4 - 49.9 % 30.1(L) 31.0(L) 31.0(L)  Platelets 140 - 400 K/uL 206 220 164    . CMP Latest Ref Rng & Units 01/09/2018 12/28/2017 12/14/2017  Glucose 70 - 99 mg/dL 104(H) 81 156(H)  BUN 8 - 23 mg/dL 25(H) 30(H) 20  Creatinine 0.61 - 1.24 mg/dL 1.44(H) 1.70(H) 1.60(H)  Sodium 135 - 145 mmol/L 137 139 140  Potassium 3.5 - 5.1 mmol/L 3.6 3.9 3.4(L)  Chloride 98 - 111 mmol/L 104 106 105  CO2 22 - 32 mmol/L _0 Calcium 8.9 - 10.3 mg/dL 9.9 10.0 9.2  Total Protein 6.5 - 8.1 g/dL 6.6 7.2 6.4  Total Bilirubin 0.3 - 1.2 mg/dL 0.4 0.5 0.4  Alkaline Phos 38 - 126 U/L 95 61 91  AST 15 - 41 U/L _1 ALT 0 - 44 U/L _2 08/29/17 BM Bx:    08/29/17 Cytogenetics:   03/13/17 Cytogenetics:        RADIOGRAPHIC STUDIES: I have personally reviewed the radiological images as listed and agreed with the findings in the report. Dg Knee Complete 4 Views Right  Result Date: 12/19/2017 CLINICAL DATA:  72 year old male with right knee pain for 1 month  with no known injury. EXAM: RIGHT KNEE - COMPLETE 4+ VIEW COMPARISON:  None. FINDINGS: Bone mineralization is within normal limits for age. Small suprapatellar effusion suspected on the cross-table lateral view. The patella is intact. There is mild for age medial compartment joint space loss. No fracture, dislocation, or other osseous abnormality identified. IMPRESSION: Small joint effusion and medial compartment joint space loss with no acute osseous abnormality identified. Electronically Signed   By: Genevie Ann M.D.   On: 12/19/2017 12:27    ASSESSMENT & PLAN:   72 y.o. male with   1. IgA Lambda Multiple Myeloma ISS Stage I -Currently in remission  Active disease was previously diagnosed based on presence of anemia, kidney insufficiency, and paraproteinemia with significant predominance of lambda light chains as well as significant elevation of IgA. PLAN -Discussed pt labwork today, 01/09/18; Creatinine decreased to 1.44, HGB is stable at 10.2 -Discussed the patient's neuropathy - Pt denies his neuropathy worsening, will continue with Velcade every other week for now -Pt will let me know if his neuropathy worsens  -Discussed the possibility to switch from Velcade to [facility], pt would like to consider this further -Will repeat SPEP and myeloma panel before next visit -Recommend PCP Dr Wynetta Emery consider [place] with ABIs to rule out peripheral arterial disease given leg cramps -Counseled pt in smoking cessation   Continue Velcade q2weeks with labs- plz schedule next 2 doses  RTC with Dr Irene Limbo in 4 weeks with labs    All of the patients questions were answered with apparent satisfaction. The patient knows to call the clinic with any problems, questions or concerns.  The total time spent in the appt was 20 minutes and more than 50% was on counseling and direct patient cares.    Sullivan Lone MD MS AAHIVMS [facility] Hematology/Oncology Physician [facility]  (Office):        [phone] (Work cell):  [phone] (Fax):           [phone]  01/09/2018 3:21 PM  I, Baldwin [place], am acting as a Education administrator for Dr Irene Limbo.   .I have reviewed the above documentation for accuracy and completeness, and I agree with the above. Brunetta Genera MD

## 2018-01-09 NOTE — Patient Instructions (Signed)
[facility] Discharge Instructions for Patients Receiving Chemotherapy  Today you received the following chemotherapy agents bortezomib (Velcade)  To help prevent nausea and vomiting after your treatment, we encourage you to take your nausea medication as directed.   If you develop nausea and vomiting that is not controlled by your nausea medication, call the clinic.   BELOW ARE SYMPTOMS THAT SHOULD BE REPORTED IMMEDIATELY:  *FEVER GREATER THAN 100.5 F  *CHILLS WITH OR WITHOUT FEVER  NAUSEA AND VOMITING THAT IS NOT CONTROLLED WITH YOUR NAUSEA MEDICATION  *UNUSUAL SHORTNESS OF BREATH  *UNUSUAL BRUISING OR BLEEDING  TENDERNESS IN MOUTH AND THROAT WITH OR WITHOUT PRESENCE OF ULCERS  *URINARY PROBLEMS  *BOWEL PROBLEMS  UNUSUAL RASH Items with * indicate a potential emergency and should be followed up as soon as possible.  Feel free to call the clinic should you have any questions or concerns. The clinic phone number is [phone].  Please show the CHEMO ALERT CARD at check-in to the Emergency Department and triage nurse.   

## 2018-01-11 ENCOUNTER — Other Ambulatory Visit: Payer: Medicare HMO

## 2018-01-11 ENCOUNTER — Telehealth: Payer: Self-pay | Admitting: Hematology

## 2018-01-11 ENCOUNTER — Ambulatory Visit: Payer: Medicare HMO | Admitting: Hematology

## 2018-01-11 ENCOUNTER — Ambulatory Visit: Payer: Medicare HMO

## 2018-01-11 NOTE — Telephone Encounter (Signed)
Appointments scheduled letter/calendar mailed to patient/  Also spoke with patient per 7/3 los

## 2018-01-25 ENCOUNTER — Inpatient Hospital Stay (HOSPITAL_BASED_OUTPATIENT_CLINIC_OR_DEPARTMENT_OTHER): Payer: Medicare HMO | Admitting: Medical

## 2018-01-25 ENCOUNTER — Inpatient Hospital Stay: Payer: Medicare HMO

## 2018-01-25 ENCOUNTER — Other Ambulatory Visit: Payer: Self-pay | Admitting: Medical

## 2018-01-25 ENCOUNTER — Other Ambulatory Visit: Payer: Self-pay | Admitting: Hematology

## 2018-01-25 VITALS — BP 151/83 | HR 73 | Temp 98.2°F | Resp 18

## 2018-01-25 DIAGNOSIS — R21 Rash and other nonspecific skin eruption: Secondary | ICD-10-CM | POA: Diagnosis not present

## 2018-01-25 DIAGNOSIS — E1140 Type 2 diabetes mellitus with diabetic neuropathy, unspecified: Secondary | ICD-10-CM | POA: Diagnosis not present

## 2018-01-25 DIAGNOSIS — N183 Chronic kidney disease, stage 3 (moderate): Secondary | ICD-10-CM | POA: Diagnosis not present

## 2018-01-25 DIAGNOSIS — C9000 Multiple myeloma not having achieved remission: Secondary | ICD-10-CM

## 2018-01-25 DIAGNOSIS — C9001 Multiple myeloma in remission: Secondary | ICD-10-CM

## 2018-01-25 DIAGNOSIS — L270 Generalized skin eruption due to drugs and medicaments taken internally: Secondary | ICD-10-CM

## 2018-01-25 DIAGNOSIS — Z5112 Encounter for antineoplastic immunotherapy: Secondary | ICD-10-CM | POA: Diagnosis not present

## 2018-01-25 DIAGNOSIS — D631 Anemia in chronic kidney disease: Secondary | ICD-10-CM | POA: Diagnosis not present

## 2018-01-25 DIAGNOSIS — I129 Hypertensive chronic kidney disease with stage 1 through stage 4 chronic kidney disease, or unspecified chronic kidney disease: Secondary | ICD-10-CM | POA: Diagnosis not present

## 2018-01-25 LAB — RETICULOCYTES
RBC.: 3.04 MIL/uL — AB (ref 4.20–5.82)
RETIC CT PCT: 1.3 % (ref 0.8–1.8)
Retic Count, Absolute: 39.5 10*3/uL (ref 34.8–93.9)

## 2018-01-25 LAB — CBC WITH DIFFERENTIAL (CANCER CENTER ONLY)
Basophils Absolute: 0 10*3/uL (ref 0.0–0.1)
Basophils Relative: 1 %
EOS ABS: 1 10*3/uL — AB (ref 0.0–0.5)
EOS PCT: 23 %
HCT: 30.8 % — ABNORMAL LOW (ref 38.4–49.9)
HEMOGLOBIN: 10.1 g/dL — AB (ref 13.0–17.1)
LYMPHS ABS: 0.9 10*3/uL (ref 0.9–3.3)
Lymphocytes Relative: 20 %
MCH: 33.2 pg (ref 27.2–33.4)
MCHC: 32.8 g/dL (ref 32.0–36.0)
MCV: 101.3 fL — ABNORMAL HIGH (ref 79.3–98.0)
Monocytes Absolute: 0.3 10*3/uL (ref 0.1–0.9)
Monocytes Relative: 7 %
NEUTROS PCT: 49 %
Neutro Abs: 2 10*3/uL (ref 1.5–6.5)
PLATELETS: 190 10*3/uL (ref 140–400)
RBC: 3.04 MIL/uL — AB (ref 4.20–5.82)
RDW: 13.2 % (ref 11.0–14.6)
WBC: 4.2 10*3/uL (ref 4.0–10.3)

## 2018-01-25 LAB — CMP (CANCER CENTER ONLY)
ALT: 19 U/L (ref 0–44)
AST: 18 U/L (ref 15–41)
Albumin: 3.9 g/dL (ref 3.5–5.0)
Alkaline Phosphatase: 122 U/L (ref 38–126)
Anion gap: 8 (ref 5–15)
BILIRUBIN TOTAL: 0.5 mg/dL (ref 0.3–1.2)
BUN: 19 mg/dL (ref 8–23)
CHLORIDE: 107 mmol/L (ref 98–111)
CO2: 25 mmol/L (ref 22–32)
CREATININE: 1.58 mg/dL — AB (ref 0.61–1.24)
Calcium: 9 mg/dL (ref 8.9–10.3)
GFR, EST AFRICAN AMERICAN: 50 mL/min — AB (ref >60)
GFR, EST NON AFRICAN AMERICAN: 43 mL/min — AB (ref >60)
Glucose, Bld: 94 mg/dL (ref 70–99)
POTASSIUM: 3.7 mmol/L (ref 3.5–5.1)
Sodium: 140 mmol/L (ref 135–145)
TOTAL PROTEIN: 6.6 g/dL (ref 6.5–8.1)

## 2018-01-25 MED ORDER — PROCHLORPERAZINE MALEATE 10 MG PO TABS
10.0000 mg | ORAL_TABLET | Freq: Once | ORAL | Status: AC
  Administered 2018-01-25: 10 mg via ORAL

## 2018-01-25 MED ORDER — TRIAMCINOLONE ACETONIDE 0.1 % EX LOTN: 1.0000 "application " | TOPICAL_LOTION | Freq: Three times a day (TID) | CUTANEOUS | 2 refills | Status: DC

## 2018-01-25 MED ORDER — BORTEZOMIB CHEMO SQ INJECTION 3.5 MG (2.5MG/ML)
1.3000 mg/m2 | Freq: Once | INTRAMUSCULAR | Status: AC
  Administered 2018-01-25: 2.25 mg via SUBCUTANEOUS
  Filled 2018-01-25: qty 0.9

## 2018-01-25 MED ORDER — PROCHLORPERAZINE MALEATE 10 MG PO TABS
ORAL_TABLET | ORAL | Status: AC
  Filled 2018-01-25: qty 1

## 2018-01-25 NOTE — Progress Notes (Signed)
Per Dr. Irene Limbo, ok to treat with creatine of 1.58.

## 2018-01-25 NOTE — Patient Instructions (Signed)

## 2018-01-28 DIAGNOSIS — N183 Chronic kidney disease, stage 3 (moderate): Secondary | ICD-10-CM | POA: Diagnosis not present

## 2018-01-28 LAB — MULTIPLE MYELOMA PANEL, SERUM
ALBUMIN SERPL ELPH-MCNC: 4 g/dL (ref 2.9–4.4)
ALPHA 1: 0.2 g/dL (ref 0.0–0.4)
ALPHA2 GLOB SERPL ELPH-MCNC: 0.7 g/dL (ref 0.4–1.0)
Albumin/Glob SerPl: 1.9 — ABNORMAL HIGH (ref 0.7–1.7)
B-Globulin SerPl Elph-Mcnc: 0.7 g/dL (ref 0.7–1.3)
Gamma Glob SerPl Elph-Mcnc: 0.7 g/dL (ref 0.4–1.8)
Globulin, Total: 2.2 g/dL (ref 2.2–3.9)
IGA: 112 mg/dL (ref 61–437)
IGG (IMMUNOGLOBIN G), SERUM: 709 mg/dL (ref 700–1600)
IGM (IMMUNOGLOBULIN M), SRM: 60 mg/dL (ref 20–172)
TOTAL PROTEIN ELP: 6.2 g/dL (ref 6.0–8.5)

## 2018-01-28 LAB — KAPPA/LAMBDA LIGHT CHAINS
KAPPA FREE LGHT CHN: 49.6 mg/L — AB (ref 3.3–19.4)
KAPPA, LAMDA LIGHT CHAIN RATIO: 2.67 — AB (ref 0.26–1.65)
LAMDA FREE LIGHT CHAINS: 18.6 mg/L (ref 5.7–26.3)

## 2018-01-28 NOTE — Progress Notes (Signed)
Symptoms Management Clinic Progress Note   Robert Sparks [id number] 1945-11-27 72 y.o.  Robert Sparks is managed by Dr. Sullivan Lone  Actively treated with chemotherapy/immunotherapy: yes  Current Therapy: Velcade and dexamethasone  Last Treated: 01/09/2018 (cycle 9, day 1)  Assessment: Plan:    Rash, drug - Plan: triamcinolone lotion (KENALOG) 0.1 %   Rash secondary to Velcade: The patient was given a prescription for triamcinolone lotion to use 3 times daily on his rash.  He was instructed not to use this on his face or genitals.  Please see After Visit Summary for patient specific instructions.  Future Appointments  Date Time Provider [place]  02/06/2018  1:45 PM Edrick Kins, DPM [facility]  02/08/2018 10:45 AM [facility] LAB 3 [facility] None  02/08/2018 11:20 AM Brunetta Genera, MD [facility] None  02/08/2018 12:30 PM [facility] INFUSION [facility] None  02/22/2018  8:30 AM [facility] LAB 5 [facility] None  02/22/2018  9:00 AM Brunetta Genera, MD [facility] None  02/22/2018 10:00 AM [facility] INFUSION [facility] None  03/08/2018 11:15 AM [facility] LAB 6 [facility] None  03/08/2018 11:40 AM Brunetta Genera, MD [facility] None  03/08/2018 12:45 PM [facility] INFUSION [facility] None  04/09/2018 10:30 AM [name], MD [facility] None    No orders of the defined types were placed in this encounter.      Subjective:   Patient ID:  Robert Sparks is a 72 y.o. (DOB Nov 18, 1945) male.  Chief Complaint: No chief complaint on file.   HPI Robert Sparks is a 72 year old male with a history of multiple myeloma who is managed by Dr. Sullivan Lone and he is treated with Velcade and dexamethasone.  The patient presents to the clinic today for cycle 10, day 1 of therapy.  He reports having an erythematous and pruritic rash over his left neck mid chest and anterior upper extremities bilaterally for the past 7 days.  He denies  any new medications or changes in personal hygiene products.  He denies shortness of breath, chest tightness, or difficulty swallowing.  Medications: I have reviewed the patient's current medications.  Allergies: No Known Allergies  Past Medical History:  Diagnosis Date  . Anemia   . Arthritis    shoulders,feet   . Cataract    per eye dr -has appt 5-11  . CKD (chronic kidney disease), stage III ([place])   . Elevated PSA   . History of ketoacidosis    03-29-2015   . History of sepsis    07-25-2013  non-compliant w/ medication  . Hyperlipidemia   . Hypertension   . Nocturia   . Peripheral neuropathy   . Prostate cancer ([place])   . Type 2 diabetes mellitus with insulin therapy [facility])     Past Surgical History:  Procedure Laterality Date  . CIRCUMCISION  1990's  . PROSTATE BIOPSY N/A 12/21/2015   Procedure: BIOPSY TRANSRECTAL ULTRASONIC PROSTATE (TUBP);  Surgeon: Festus Aloe, MD;  Location: [facility];  Service: Urology;  Laterality: N/A;    Family History  Problem Relation Age of Onset  . Cancer Neg Hx   . Colon cancer Neg Hx   . Colon polyps Neg Hx   . Esophageal cancer Neg Hx   . Rectal cancer Neg Hx   . Stomach cancer Neg Hx     Social History   Socioeconomic History  . Marital status: Single    Spouse name: Not on file  . Number of children: Not  on file  . Years of education: Not on file  . Highest education level: Not on file  Occupational History  . Not on file  Social Needs  . Financial resource strain: Not on file  . Food insecurity:    Worry: Not on file    Inability: Not on file  . Transportation needs:    Medical: Not on file    Non-medical: Not on file  Tobacco Use  . Smoking status: Current Every Day Smoker    Packs/day: 0.25    Years: 50.00    Pack years: 12.50    Types: Cigarettes  . Smokeless tobacco: Never Used  . Tobacco comment: 1 ppwk-4-5 a day   Substance and Sexual Activity  . Alcohol use: Yes    Alcohol/week:  0.6 oz    Types: 1 Cans of beer per week    Comment: 1 every day-quit couple weeks ago  . Drug use: No  . Sexual activity: Not Currently  Lifestyle  . Physical activity:    Days per week: Not on file    Minutes per session: Not on file  . Stress: Not on file  Relationships  . Social connections:    Talks on phone: Not on file    Gets together: Not on file    Attends religious service: Not on file    Active member of club or organization: Not on file    Attends meetings of clubs or organizations: Not on file    Relationship status: Not on file  . Intimate partner violence:    Fear of current or ex partner: Not on file    Emotionally abused: Not on file    Physically abused: Not on file    Forced sexual activity: Not on file  Other Topics Concern  . Not on file  Social History Narrative  . Not on file    Past Medical History, Surgical history, Social history, and Family history were reviewed and updated as appropriate.   Please see review of systems for further details on the patient's review from today.   Review of Systems:  Review of Systems  Constitutional: Negative for chills, diaphoresis and fever.  HENT: Negative for facial swelling and trouble swallowing.   Respiratory: Negative for cough, chest tightness and shortness of breath.   Cardiovascular: Negative for chest pain.  Skin: Positive for rash.    Objective:   Physical Exam:  There were no vitals taken for this visit. ECOG: 0  Physical Exam  Constitutional: No distress.  HENT:  Head: Normocephalic and atraumatic.  Mouth/Throat: Oropharynx is clear and moist.  Cardiovascular: Normal rate, regular rhythm, normal heart sounds and intact distal pulses. Exam reveals no gallop and no friction rub.  No murmur heard. Pulmonary/Chest: Effort normal and breath sounds normal. No stridor. No respiratory distress. He has no wheezes. He has no rales.  Musculoskeletal: He exhibits no edema or deformity.    Neurological: He is alert. Coordination normal.  Skin: Skin is warm and dry. Rash noted. He is not diaphoretic. There is erythema.       Lab Review:     Component Value Date/Time   NA 140 01/25/2018 0803   NA 138 12/06/2017 0920   NA 137 07/13/2017 0938   K 3.7 01/25/2018 0803   K 3.3 (L) 07/13/2017 0938   CL 107 01/25/2018 0803   CO2 25 01/25/2018 0803   CO2 25 07/13/2017 0938   GLUCOSE 94 01/25/2018 0803   GLUCOSE 148 (H)  07/13/2017 0938   BUN 19 01/25/2018 0803   BUN 42 (H) 12/06/2017 0920   BUN 23.8 07/13/2017 0938   CREATININE 1.58 (H) 01/25/2018 0803   CREATININE 1.7 (H) 07/13/2017 0938   CALCIUM 9.0 01/25/2018 0803   CALCIUM 9.3 07/13/2017 0938   PROT 6.6 01/25/2018 0803   PROT 7.3 12/06/2017 0920   PROT 6.8 07/13/2017 0938   ALBUMIN 3.9 01/25/2018 0803   ALBUMIN 4.8 12/06/2017 0920   ALBUMIN 3.8 07/13/2017 0938   AST 18 01/25/2018 0803   AST 14 07/13/2017 0938   ALT 19 01/25/2018 0803   ALT 13 07/13/2017 0938   ALKPHOS 122 01/25/2018 0803   ALKPHOS 119 07/13/2017 0938   BILITOT 0.5 01/25/2018 0803   BILITOT 0.45 07/13/2017 0938   GFRNONAA 43 (L) 01/25/2018 0803   GFRNONAA 41 (L) 06/21/2016 1047   GFRAA 50 (L) 01/25/2018 0803   GFRAA 48 (L) 06/21/2016 1047       Component Value Date/Time   WBC 4.2 01/25/2018 0803   WBC 4.5 08/29/2017 0911   RBC 3.04 (L) 01/25/2018 0803   RBC 3.04 (L) 01/25/2018 0803   HGB 10.1 (L) 01/25/2018 0803   HGB 12.2 (L) 12/06/2017 0920   HGB 10.1 (L) 07/13/2017 0938   HCT 30.8 (L) 01/25/2018 0803   HCT 37.1 (L) 12/06/2017 0920   HCT 30.6 (L) 07/13/2017 0938   PLT 190 01/25/2018 0803   PLT 250 12/06/2017 0920   MCV 101.3 (H) 01/25/2018 0803   MCV 98 (H) 12/06/2017 0920   MCV 96.2 07/13/2017 0938   MCH 33.2 01/25/2018 0803   MCHC 32.8 01/25/2018 0803   RDW 13.2 01/25/2018 0803   RDW 14.1 12/06/2017 0920   RDW 14.4 07/13/2017 0938   LYMPHSABS 0.9 01/25/2018 0803   LYMPHSABS 0.8 (L) 07/13/2017 0938   MONOABS 0.3  01/25/2018 0803   MONOABS 0.4 07/13/2017 0938   EOSABS 1.0 (H) 01/25/2018 0803   EOSABS 0.5 07/13/2017 0938   EOSABS 0.8 (H) 11/21/2016 1416   BASOSABS 0.0 01/25/2018 0803   BASOSABS 0.0 07/13/2017 0938   -------------------------------  Imaging from last 24 hours (if applicable):  Radiology interpretation: No results found.

## 2018-01-29 MED FILL — PRAVASTATIN SODIUM 20 MG TA: 20 | 90 days supply | Qty: 90 | Fill #1

## 2018-01-30 ENCOUNTER — Other Ambulatory Visit: Payer: Self-pay

## 2018-01-30 MED ORDER — INSULIN PEN NEEDLE 32G X 4 MM MISC: 3 refills | Status: DC

## 2018-01-30 MED FILL — TRUEPLUS PEN NDL 32GX5/32": 32G X 4 MM | 50 days supply | Qty: 100 | Fill #0

## 2018-01-30 MED FILL — TRUEPLUS PEN NDL 32GX5/32: 32G X 4 MM | 50 days supply | Qty: 100 | Fill #0

## 2018-01-30 MED FILL — ACCU-CHEK AVIVA PLUS TEST S: 30 days supply | Qty: 100 | Fill #5

## 2018-02-01 DIAGNOSIS — M25561 Pain in right knee: Secondary | ICD-10-CM | POA: Diagnosis not present

## 2018-02-01 MED FILL — METHYLPREDNISOLONE 4 MG TAB: 4 | 6 days supply | Qty: 21 | Fill #0

## 2018-02-06 ENCOUNTER — Ambulatory Visit (INDEPENDENT_AMBULATORY_CARE_PROVIDER_SITE_OTHER): Payer: Medicare HMO | Admitting: Podiatry

## 2018-02-06 DIAGNOSIS — M79676 Pain in unspecified toe(s): Secondary | ICD-10-CM

## 2018-02-06 DIAGNOSIS — E0842 Diabetes mellitus due to underlying condition with diabetic polyneuropathy: Secondary | ICD-10-CM

## 2018-02-06 DIAGNOSIS — B351 Tinea unguium: Secondary | ICD-10-CM | POA: Diagnosis not present

## 2018-02-06 DIAGNOSIS — L989 Disorder of the skin and subcutaneous tissue, unspecified: Secondary | ICD-10-CM

## 2018-02-06 MED FILL — ACYCLOVIR 400 MG TABLET: 400 | 30 days supply | Qty: 60 | Fill #1

## 2018-02-06 MED FILL — NOVOLOG MIX 70-30 FLEXPEN S: (70-30) 100 | 32 days supply | Qty: 6 | Fill #4

## 2018-02-06 MED FILL — DULoxetine HCL 20 MG CPEP: 20 | 30 days supply | Qty: 30 | Fill #1

## 2018-02-07 ENCOUNTER — Other Ambulatory Visit: Payer: Self-pay

## 2018-02-07 DIAGNOSIS — N183 Chronic kidney disease, stage 3 (moderate): Secondary | ICD-10-CM | POA: Diagnosis not present

## 2018-02-07 DIAGNOSIS — I129 Hypertensive chronic kidney disease with stage 1 through stage 4 chronic kidney disease, or unspecified chronic kidney disease: Secondary | ICD-10-CM | POA: Diagnosis not present

## 2018-02-07 DIAGNOSIS — N2581 Secondary hyperparathyroidism of renal origin: Secondary | ICD-10-CM | POA: Diagnosis not present

## 2018-02-07 DIAGNOSIS — C9000 Multiple myeloma not having achieved remission: Secondary | ICD-10-CM | POA: Diagnosis not present

## 2018-02-07 DIAGNOSIS — C9001 Multiple myeloma in remission: Secondary | ICD-10-CM

## 2018-02-07 DIAGNOSIS — D631 Anemia in chronic kidney disease: Secondary | ICD-10-CM | POA: Diagnosis not present

## 2018-02-07 NOTE — Progress Notes (Signed)
HEMATOLOGY/ONCOLOGY CLINIC NOTE  Date of Service: 02/08/2018  Patient Care Team: [name], MD as PCP - General (Internal Medicine) [name], MD as Consulting Physician (Hematology and Oncology)  CHIEF COMPLAINTS/PURPOSE OF CONSULTATION:  F/u for continued mx of Multiple Myeloma  Oncologic History:  72 y.o. male with diagnosis of IgA lambda active multiple myeloma, ISS Stage I. Active disease diagnosed based on presence of anemia, kidney insufficiency, and paraproteinemia with significant predominance of lambda light chains as well as significant elevation of IgA. Bone marrow biopsy confirmed presence of atypical monoclonal plasma cell process in the bone marrow comprising at least 7% of the cellularity. Based on the findings, patient was started on treatment with lenalidomide, bortezomib, and low-dose dexamethasone based on the anticipated tolerance by the patient. Lenalidomide was started at 10 mg daily based on creatinine clearance of 42, dexamethasone dose was reduced to 20 mg weekly based on patient's age. Treatment was complicated by rapid development of cutaneous rash attributed to lenalidomide, inaddition to decreased renal function and now patient has persistent severe anemia. Side-effects were attributed to Lenalidomide and lenalidomide was discontinued with subsequent recovery.  Patient has been receiving bortezomib weekly with low-dose dexamethasone in 4-day cycles.  Patient has completed induction systemic therapy for his disease with repeat bone marrow biopsy confirming complete response including no evidence of minimal residual disease by cytogenetics or FISH.  HISTORY OF PRESENTING ILLNESS:   Robert Sparks is a wonderful 72 y.o. male who has been referred to [place] by my colleague Dr Grace Isaac for evaluation and management of Multiple Myeloma. The pt reports that he is doing well overall.   The pt has been previously followed by my colleague Dr Grace Isaac. The pt was treated with Revlimid, Velcade, and dexamethasone and was subsequently switched to Velcade and Dexamethasone after a Revlimid intolerance.   The pt reports some knee aches and skin soreness in his legs which is worse at night. He notes that he has had diabetic-related peripheral neuropathy. The pt has not seen [facility] yet for a discussion of a BM transplant. He continues taking Acyclovir and notes no difficulty taking maintenance Velcade.   He adds that he takes Gabapentin and sweats.  He notes that his neuropathy is currently stable and denies it worsening.   He notes that a pack of cigarettes lasts him for about 3 days.   Most recent lab results (12/14/17) of CBC and CMP  is as follows: all values are WNL except for RBC at 3.08, HGB at 10.1, HCT at 31.0, MCV at 100.6, Eosinophils Abs at 1.4k, Potassium at 3.4, Glucose at 156, Creatinine at 1.60. LDH 12/14/17 is 141 Beta 2 microglobulin 12/14/17 is 2.4  On review of systems, pt reports knee pain, night time LE skin soreness, peripheral neuropathy, eating well, strong appetite, intermittent sweating and denies fevers, chills, back pains, abdominal pains, and any other symptoms.   [name] DM type 2, HTN, CKD. On Social Hx the pt reports smoking a pack of cigarettes every 3 days.   [name] returns today regarding management and evaluation of his Multiple Myeloma. [name] was on 01/09/18. The pt reports that he is doing well overall.   The pt reports that he recently had an itchy chest rash for which he saw my colleague Sandi Mealy, PA that was not accompanied by mouth sores or leg swelling, and did not extend  into his arms, legs, or neck. He has begun taking a medrol taper set per his orthopedist for knee pain.  He has been started on 66m Cymbalta for his neuropathy.   The pt notes that his right leg cramping continues to be bothersome for him, but denies the  pain worsening while walking.   Lab results today (02/08/18) of CBC w/diff, CMP, and Reticulocytes is as follows: all values are WNL except for RBC at 3.28, HGB at 10.9, HCT at 32.8, MCV at 100.0, Lymphs abs at 700, Eosinophils abs at 1.0k, Glucose at 158, Creatinine at 1.50, GFR at 46. LDH 02/08/18 is WNL at 133  On review of systems, pt reports recent chest rash, stable energy levels, stable neuropathy, right leg cramping, and denies leg swelling, mouth sores, new bone pains, and any other symptoms.   MEDICAL HISTORY:  Past Medical History:  Diagnosis Date  . Anemia   . Arthritis    shoulders,feet   . Cataract    per eye dr -has appt 5-11  . CKD (chronic kidney disease), stage III ([place]   . Elevated PSA   . History of ketoacidosis    03-29-2015   . History of sepsis    07-25-2013  non-compliant w/ medication  . Hyperlipidemia   . Hypertension   . Nocturia   . Peripheral neuropathy   . Prostate cancer ([place]   . Type 2 diabetes mellitus with insulin therapy ([facility]     SURGICAL HISTORY: Past Surgical History:  Procedure Laterality Date  . CIRCUMCISION  1990's  . PROSTATE BIOPSY N/A 12/21/2015   Procedure: BIOPSY TRANSRECTAL ULTRASONIC PROSTATE (TUBP);  Surgeon: MFestus Aloe MD;  [facility]  Service: Urology;  Laterality: N/A;    SOCIAL HISTORY: Social History   Socioeconomic History  . Marital status: Single    Spouse name: Not on file  . Number of children: Not on file  . Years of education: Not on file  . Highest education level: Not on file  Occupational History  . Not on file  Social Needs  . Financial resource strain: Not on file  . Food insecurity:    Worry: Not on file    Inability: Not on file  . Transportation needs:    Medical: Not on file    Non-medical: Not on file  Tobacco Use  . Smoking status: Current Every Day Smoker    Packs/day: 0.25    Years: 50.00    Pack years: 12.50    Types: Cigarettes  . Smokeless tobacco:  Never Used  . Tobacco comment: 1 ppwk-4-5 a day   Substance and Sexual Activity  . Alcohol use: Yes    Alcohol/week: 0.6 oz    Types: 1 Cans of beer per week    Comment: 1 every day-quit couple weeks ago  . Drug use: No  . Sexual activity: Not Currently  Lifestyle  . Physical activity:    Days per week: Not on file    Minutes per session: Not on file  . Stress: Not on file  Relationships  . Social connections:    Talks on phone: Not on file    Gets together: Not on file    Attends religious service: Not on file    Active member of club or organization: Not on file    Attends meetings of clubs or organizations: Not on file    Relationship status: Not on file  . Intimate partner violence:    Fear of current  or ex partner: Not on file    Emotionally abused: Not on file    Physically abused: Not on file    Forced sexual activity: Not on file  Other Topics Concern  . Not on file  Social History Narrative  . Not on file    FAMILY HISTORY: Family History  Problem Relation Age of Onset  . Cancer Neg Hx   . Colon cancer Neg Hx   . Colon polyps Neg Hx   . Esophageal cancer Neg Hx   . Rectal cancer Neg Hx   . Stomach cancer Neg Hx     ALLERGIES:  has No Known Allergies.  MEDICATIONS:  Current Outpatient Medications  Medication Sig Dispense Refill  . ACCU-CHEK AVIVA PLUS test strip 1 each by Other route See admin instructions. 100 each 12  . acyclovir (ZOVIRAX) 400 MG tablet TAKE 1 TABLET BY MOUTH 2 TIMES DAILY. 60 tablet 3  . amLODipine (NORVASC) 5 MG tablet Take 1 tablet (5 mg total) by mouth daily. 90 tablet 3  . aspirin EC 81 MG tablet Take 1 tablet (81 mg total) by mouth daily. 90 tablet 3  . Cholecalciferol (VITAMIN D) 2000 units CAPS Take 2,000 Units by mouth daily.    . diclofenac sodium (VOLTAREN) 1 % GEL Apply 2 g topically 4 (four) times daily. 100 g 2  . DULoxetine (CYMBALTA) 20 MG capsule Take 1 capsule (20 mg total) by mouth daily. 30 capsule 3  . gabapentin  (NEURONTIN) 300 MG capsule TAKE 1 CAPSULE BY MOUTH IN THE MORNING AND AT LUNCH, TAKE 2 CAPSULES BY MOUTH AT NIGHT. 120 capsule 4  . glucose blood test strip See admin instructions.    . insulin aspart protamine - aspart (NOVOLOG 70/30 MIX) (70-30) 100 UNIT/ML FlexPen Inject 0.09 mLs (9 Units total) into the skin 2 (two) times daily with a meal. 15 mL 6  . Insulin Pen Needle (TRUEPLUS PEN NEEDLES) 32G X 4 MM MISC Use as directed twice daily with insulin 100 each 3  . ixazomib citrate (NINLARO) 3 MG capsule Take  47m on Day 1, Day 8 and D15 of every 28 day cycle.Take on an empty stomach 1hr before or 2hrs after food. Do not crush, chew, or open. 3 capsule 1  . lidocaine-prilocaine (EMLA) cream Apply to affected area once 30 g 3  . mometasone (ELOCON) 0.1 % cream Apply 1 application topically daily. 45 g 3  . Multiple Vitamin (MULTIVITAMIN WITH MINERALS) TABS tablet Take 1 tablet by mouth daily. 60 tablet 2  . ondansetron (ZOFRAN) 8 MG tablet Take 1 tablet (8 mg total) by mouth 2 (two) times daily as needed (Nausea or vomiting). 30 tablet 1  . pravastatin (PRAVACHOL) 20 MG tablet Take 1 tablet (20 mg total) by mouth daily. 90 tablet 3  . prochlorperazine (COMPAZINE) 10 MG tablet Take 1 tablet (10 mg total) by mouth every 6 (six) hours as needed (Nausea or vomiting). 30 tablet 1  . senna-docusate (SENOKOT-S) 8.6-50 MG tablet Take 2 tablets by mouth 2 (two) times daily. 60 tablet 2  . triamcinolone lotion (KENALOG) 0.1 % Apply 1 application topically 3 (three) times daily. 60 mL 2   Current Facility-Administered Medications  Medication Dose Route Frequency Provider Last Rate Last Dose  . 0.9 %  sodium chloride infusion  500 mL Intravenous Continuous Pyrtle, JLajuan Lines MD        REVIEW OF SYSTEMS:    A 10+ POINT REVIEW OF SYSTEMS WAS OBTAINED including neurology,  dermatology, psychiatry, cardiac, respiratory, lymph, extremities, GI, GU, Musculoskeletal, constitutional, breasts, reproductive, HEENT.  All  pertinent positives are noted in the HPI.  All others are negative.   PHYSICAL EXAMINATION: ECOG PERFORMANCE STATUS: 1 - Symptomatic but completely ambulatory  GENERAL:alert, in no acute distress and comfortable SKIN: no acute rashes, no significant lesions EYES: conjunctiva are pink and non-injected, sclera anicteric OROPHARYNX: MMM, no exudates, no oropharyngeal erythema or ulceration NECK: supple, no JVD LYMPH:  no palpable lymphadenopathy in the cervical, axillary or inguinal regions LUNGS: clear to auscultation b/l with normal respiratory effort HEART: regular rate & rhythm ABDOMEN:  normoactive bowel sounds , non tender, not distended. No palpable hepatosplenomegaly.  Extremity: no pedal edema PSYCH: alert & oriented x 3 with fluent speech NEURO: no focal motor/sensory deficits    LABORATORY DATA:  I have reviewed the data as listed  . CBC Latest Ref Rng & Units 02/08/2018 01/25/2018 01/09/2018  WBC 4.0 - 10.3 K/uL 4.4 4.2 4.7  Hemoglobin 13.0 - 17.1 g/dL 10.9(L) 10.1(L) 10.2(L)  Hematocrit 38.4 - 49.9 % 32.8(L) 30.8(L) 30.1(L)  Platelets 140 - 400 K/uL 194 190 206    . CMP Latest Ref Rng & Units 02/08/2018 01/25/2018 01/09/2018  Glucose 70 - 99 mg/dL 158(H) 94 104(H)  BUN 8 - 23 mg/dL 23 19 25(H)  Creatinine 0.61 - 1.24 mg/dL 1.50(H) 1.58(H) 1.44(H)  Sodium 135 - 145 mmol/L 137 140 137  Potassium 3.5 - 5.1 mmol/L 3.6 3.7 3.6  Chloride 98 - 111 mmol/L 102 107 104  CO2 22 - 32 mmol/L '25 25 26  ' Calcium 8.9 - 10.3 mg/dL 9.3 9.0 9.9  Total Protein 6.5 - 8.1 g/dL 7.1 6.6 6.6  Total Bilirubin 0.3 - 1.2 mg/dL 0.5 0.5 0.4  Alkaline Phos 38 - 126 U/L 68 122 95  AST 15 - 41 U/L '16 18 19  ' ALT 0 - 44 U/L '14 19 22   ' 08/29/17 BM Bx:    08/29/17 Cytogenetics:   03/13/17 Cytogenetics:          RADIOGRAPHIC STUDIES: I have personally reviewed the radiological images as listed and agreed with the findings in the report. No results found.  ASSESSMENT & PLAN:   72 y.o. male  with   1. IgA Lambda Multiple Myeloma ISS Stage I -Currently in remission  Active disease was previously diagnosed based on presence of anemia, kidney insufficiency, and paraproteinemia with significant predominance of lambda light chains as well as significant elevation of IgA.  PLAN:  -Discussed the possibility to switch from Velcade to [facility], pt is agreeable with this -Discussed pt labwork today, 02/08/18; HGB increased to 10.9, creatinine at 1.50, blood counts and chemistries are otherwise stable -Pt continues in remission with last MMP labs on 01/25/18 not revealing an M Spike  -Will continue with Velcade as his recent rash is of uncertain etiology and the pt has tolerated maintenance Velcade well for over a year now. -Will monitor for newly developing skin rashes and pt will let me know if any new skin rashes develop -Will begin benadryl prior to Velcade injection -Ninlaro prescription provided to our oral ctx pharmacist for approval . Will switch from Velcade to [facility] per patient preference. -Recommend PCP rule out Peripheral arterial disease with [place] with ABIs given leg cramping -Will see pt back in 4 weeks    -continue Velcade maintenance Q2weeks till [facility]ley is available then will stop Velcade -RTC with Dr Irene Limbo in 4 weeks    All of the patients questions  were answered with apparent satisfaction. The patient knows to call the clinic with any problems, questions or concerns.  The total time spent in the appt was 25 minutes and more than 50% was on counseling and direct patient cares.    Sullivan Lone MD MS AAHIVMS [facility] Hematology/Oncology Physician [facility]  (Office):       [phone] (Work cell):  [phone] (Fax):           [phone]  02/08/2018 12:13 PM  I, Baldwin [place], am acting as a scribe for Dr. Irene Limbo  .I have reviewed the above documentation for accuracy and completeness, and I agree with the above. Brunetta Genera MD

## 2018-02-08 ENCOUNTER — Inpatient Hospital Stay (HOSPITAL_BASED_OUTPATIENT_CLINIC_OR_DEPARTMENT_OTHER): Payer: Medicare HMO | Admitting: Hematology

## 2018-02-08 ENCOUNTER — Other Ambulatory Visit: Payer: Medicare HMO

## 2018-02-08 ENCOUNTER — Encounter: Payer: Self-pay | Admitting: Hematology

## 2018-02-08 ENCOUNTER — Ambulatory Visit: Payer: Medicare HMO

## 2018-02-08 ENCOUNTER — Inpatient Hospital Stay: Payer: Medicare HMO

## 2018-02-08 ENCOUNTER — Inpatient Hospital Stay: Payer: Medicare HMO | Attending: Hematology and Oncology

## 2018-02-08 ENCOUNTER — Telehealth: Payer: Self-pay | Admitting: Hematology

## 2018-02-08 VITALS — BP 103/64 | HR 77 | Temp 98.2°F | Resp 18 | Ht 64.0 in | Wt 147.3 lb

## 2018-02-08 DIAGNOSIS — C9001 Multiple myeloma in remission: Secondary | ICD-10-CM

## 2018-02-08 DIAGNOSIS — E1122 Type 2 diabetes mellitus with diabetic chronic kidney disease: Secondary | ICD-10-CM | POA: Diagnosis not present

## 2018-02-08 DIAGNOSIS — F17210 Nicotine dependence, cigarettes, uncomplicated: Secondary | ICD-10-CM | POA: Insufficient documentation

## 2018-02-08 DIAGNOSIS — Z79899 Other long term (current) drug therapy: Secondary | ICD-10-CM | POA: Diagnosis not present

## 2018-02-08 DIAGNOSIS — R21 Rash and other nonspecific skin eruption: Secondary | ICD-10-CM | POA: Diagnosis not present

## 2018-02-08 DIAGNOSIS — E1140 Type 2 diabetes mellitus with diabetic neuropathy, unspecified: Secondary | ICD-10-CM

## 2018-02-08 DIAGNOSIS — R252 Cramp and spasm: Secondary | ICD-10-CM

## 2018-02-08 DIAGNOSIS — Z5112 Encounter for antineoplastic immunotherapy: Secondary | ICD-10-CM | POA: Diagnosis not present

## 2018-02-08 LAB — CMP (CANCER CENTER ONLY)
ALBUMIN: 4.1 g/dL (ref 3.5–5.0)
ALT: 14 U/L (ref 0–44)
ANION GAP: 10 (ref 5–15)
AST: 16 U/L (ref 15–41)
Alkaline Phosphatase: 68 U/L (ref 38–126)
BUN: 23 mg/dL (ref 8–23)
CHLORIDE: 102 mmol/L (ref 98–111)
CO2: 25 mmol/L (ref 22–32)
Calcium: 9.3 mg/dL (ref 8.9–10.3)
Creatinine: 1.5 mg/dL — ABNORMAL HIGH (ref 0.61–1.24)
GFR, Est AFR Am: 53 mL/min — ABNORMAL LOW (ref >60)
GFR, Estimated: 46 mL/min — ABNORMAL LOW (ref >60)
GLUCOSE: 158 mg/dL — AB (ref 70–99)
POTASSIUM: 3.6 mmol/L (ref 3.5–5.1)
SODIUM: 137 mmol/L (ref 135–145)
Total Bilirubin: 0.5 mg/dL (ref 0.3–1.2)
Total Protein: 7.1 g/dL (ref 6.5–8.1)

## 2018-02-08 LAB — CBC WITH DIFFERENTIAL (CANCER CENTER ONLY)
Basophils Absolute: 0 10*3/uL (ref 0.0–0.1)
Basophils Relative: 1 %
EOS PCT: 22 %
Eosinophils Absolute: 1 10*3/uL — ABNORMAL HIGH (ref 0.0–0.5)
HEMATOCRIT: 32.8 % — AB (ref 38.4–49.9)
Hemoglobin: 10.9 g/dL — ABNORMAL LOW (ref 13.0–17.1)
LYMPHS ABS: 0.7 10*3/uL — AB (ref 0.9–3.3)
LYMPHS PCT: 16 %
MCH: 33.2 pg (ref 27.2–33.4)
MCHC: 33.2 g/dL (ref 32.0–36.0)
MCV: 100 fL — AB (ref 79.3–98.0)
MONO ABS: 0.2 10*3/uL (ref 0.1–0.9)
MONOS PCT: 6 %
NEUTROS ABS: 2.5 10*3/uL (ref 1.5–6.5)
Neutrophils Relative %: 55 %
PLATELETS: 194 10*3/uL (ref 140–400)
RBC: 3.28 MIL/uL — ABNORMAL LOW (ref 4.20–5.82)
RDW: 13 % (ref 11.0–14.6)
WBC Count: 4.4 10*3/uL (ref 4.0–10.3)

## 2018-02-08 LAB — RETICULOCYTES
RBC.: 3.28 MIL/uL — AB (ref 4.20–5.82)
RETIC COUNT ABSOLUTE: 52.5 10*3/uL (ref 34.8–93.9)
Retic Ct Pct: 1.6 % (ref 0.8–1.8)

## 2018-02-08 LAB — LACTATE DEHYDROGENASE: LDH: 133 U/L (ref 98–192)

## 2018-02-08 MED ORDER — PROCHLORPERAZINE MALEATE 10 MG PO TABS
10.0000 mg | ORAL_TABLET | Freq: Once | ORAL | Status: AC
  Administered 2018-02-08: 10 mg via ORAL

## 2018-02-08 MED ORDER — PROCHLORPERAZINE MALEATE 10 MG PO TABS
ORAL_TABLET | ORAL | Status: AC
Start: 2018-02-08 — End: ?
  Filled 2018-02-08: qty 1

## 2018-02-08 MED ORDER — IXAZOMIB CITRATE 3 MG PO CAPS: ORAL_CAPSULE | ORAL | 1 refills | Status: DC

## 2018-02-08 MED ORDER — DIPHENHYDRAMINE HCL 25 MG PO TABS
25.0000 mg | ORAL_TABLET | Freq: Once | ORAL | Status: AC
  Administered 2018-02-08: 25 mg via ORAL
  Filled 2018-02-08: qty 1

## 2018-02-08 MED ORDER — BORTEZOMIB CHEMO SQ INJECTION 3.5 MG (2.5MG/ML)
1.3000 mg/m2 | Freq: Once | INTRAMUSCULAR | Status: AC
  Administered 2018-02-08: 2.25 mg via SUBCUTANEOUS
  Filled 2018-02-08: qty 0.9

## 2018-02-08 MED ORDER — DIPHENHYDRAMINE HCL 25 MG PO CAPS
ORAL_CAPSULE | ORAL | Status: AC
  Filled 2018-02-08: qty 1

## 2018-02-08 NOTE — Patient Instructions (Signed)
[facility] Discharge Instructions for Patients Receiving Chemotherapy  Today you received the following chemotherapy agents Velcade.  To help prevent nausea and vomiting after your treatment, we encourage you to take your nausea medication as directed.  If you develop nausea and vomiting that is not controlled by your nausea medication, call the clinic.   BELOW ARE SYMPTOMS THAT SHOULD BE REPORTED IMMEDIATELY:  *FEVER GREATER THAN 100.5 F  *CHILLS WITH OR WITHOUT FEVER  NAUSEA AND VOMITING THAT IS NOT CONTROLLED WITH YOUR NAUSEA MEDICATION  *UNUSUAL SHORTNESS OF BREATH  *UNUSUAL BRUISING OR BLEEDING  TENDERNESS IN MOUTH AND THROAT WITH OR WITHOUT PRESENCE OF ULCERS  *URINARY PROBLEMS  *BOWEL PROBLEMS  UNUSUAL RASH Items with * indicate a potential emergency and should be followed up as soon as possible.  Feel free to call the clinic should you have any questions or concerns. The clinic phone number is [phone].  Please show the CHEMO ALERT CARD at check-in to the Emergency Department and triage nurse.   

## 2018-02-08 NOTE — Telephone Encounter (Signed)
Gave pt avs and calendar with appts per 8/2 los °

## 2018-02-08 NOTE — Patient Instructions (Addendum)
Take 25mg  Benadryl every 6 hours- over the counter if you need it for itching or rash

## 2018-02-11 ENCOUNTER — Telehealth: Payer: Self-pay | Admitting: Pharmacist

## 2018-02-11 DIAGNOSIS — C9000 Multiple myeloma not having achieved remission: Secondary | ICD-10-CM

## 2018-02-11 MED ORDER — IXAZOMIB CITRATE 3 MG PO CAPS: ORAL_CAPSULE | ORAL | 1 refills | Status: DC

## 2018-02-11 MED FILL — GABAPENTIN 300 MG CAPSULE: 300 | 30 days supply | Qty: 120 | Fill #4

## 2018-02-11 NOTE — Telephone Encounter (Signed)
Oral Oncology Pharmacist Encounter  Received new prescription for Ninlaro (ixazomib) for the maintenance treatment of multiple myeloma, planned duration until disease progression or unacceptable toxicity.  Labs from 02/08/2018 assessed SCr=1.5, est CrCl ~40 mL/min, no dose adjustment per manufacturer for est CrCl > 30 mL/min Noted dose reduction to 37m once weekly for 3 weeks on, 1 week off, for increased toleration with pre-existing peripheral neuropathy and renal duysfucntion  Current medication list in Epic reviewed, no DDIs with Ninlaro identified.  [facility].  Oral Oncology Clinic will continue to follow for insurance authorization, copayment issues, initial counseling and start date.  JJohny Drilling PharmD, BCPS, BCOP  02/11/2018 2:18 PM Oral Oncology Clinic [phone]

## 2018-02-12 NOTE — Progress Notes (Signed)
    Subjective: Patient is a 72 y.o. male with PMHx of T2DM presenting to the office today for follow up evaluation of two painful callus lesions to the bilateral feet that have been present for several months. Walking and bearing weight increases the pain. He has not done anything at home for treatment.    Patient also complains of elongated, thickened nails that cause pain while ambulating in shoes. He is unable to trim his own nails. Patient presents today for further treatment and evaluation.   Past Medical History:  Diagnosis Date  . Anemia   . Arthritis    shoulders,feet   . Cataract    per eye dr -has appt 5-11  . CKD (chronic kidney disease), stage III ([place])   . Elevated PSA   . History of ketoacidosis    03-29-2015   . History of sepsis    07-25-2013  non-compliant w/ medication  . Hyperlipidemia   . Hypertension   . Nocturia   . Peripheral neuropathy   . Prostate cancer ([place])   . Type 2 diabetes mellitus with insulin therapy ([facility])     Objective:  Physical Exam General: Alert and oriented x3 in no acute distress  Dermatology: Hyperkeratotic lesions present on the bilateral feet x 2. Pain on palpation with a central nucleated core noted. Skin is warm, dry and supple bilateral lower extremities. Negative for open lesions or macerations. Nails are tender, long, thickened and dystrophic with subungual debris, consistent with onychomycosis, 1-5 bilateral. No signs of infection noted.  Vascular: Palpable pedal pulses bilaterally. No edema or erythema noted. Capillary refill within normal limits.  Neurological: Epicritic and protective threshold diminished bilaterally.   Musculoskeletal Exam: Pain on palpation at the keratotic lesion noted. Range of motion within normal limits bilateral. Muscle strength 5/5 in all groups bilateral.  Assessment: 1. Onychodystrophic nails 1-5 bilateral with hyperkeratosis of nails.  2. Onychomycosis of nail due to dermatophyte bilateral 3.  Pre-ulcerative callus lesions to the bilateral feet x 2   Plan of Care:  #1 Patient evaluated. #2 Excisional debridement of keratoic lesion using a chisel blade was performed without incident.  #3 Dressed with light dressing. #4 Mechanical debridement of nails 1-5 bilaterally performed using a nail nipper. Filed with dremel without incident.  #5 Prescription for pain cream to be dispensed by [facility] provided.  #6 Patient is to return to the clinic in 3 months.   Edrick Kins, DPM [facility]  Dr. Edrick Kins, [address]                                        [place], [place] [address]                Office [phone]  Fax [phone]

## 2018-02-13 ENCOUNTER — Telehealth: Payer: Self-pay | Admitting: Pharmacist

## 2018-02-13 NOTE — Telephone Encounter (Signed)
Oral Oncology Pharmacist Encounter  Insurance authorization for Kennieth Rad is required Submitted as URGENT on cover my meds Key: [id number] status is pending  Johny Drilling, PharmD, BCPS, BCOP  02/13/2018 3:33 PM Oral Oncology Clinic [phone]

## 2018-02-13 NOTE — Telephone Encounter (Signed)
Oral Chemotherapy Pharmacist Encounter   I spoke with patient for overview of: Ninlaro.  Counseled patient on administration, dosing, side effects, monitoring, drug-food interactions, safe handling, storage, and disposal.  Patient will take Ninlaro 3mg  capsules, 1 capsule by mouth once weekly. Take on an empty stomach, 1 hour before or 2 hours after a meal. Take on days 1, 8, and 15 of each 28 day cycle.  Patient will likely take Ninlaro before bedtime on Fridays.  Ninlaro start date: 02/22/2018  Adverse effects of Ninlaro include but are not limited to: peripheral edema, constipation, nausea, vomiting, decreased blood counts, and peripheral neuropathy.   Patient has anti-emetic on hand and knows to take it if nausea develops.    Reviewed with patient importance of keeping a medication schedule and plan for any missed doses.  Robert Sparks voiced understanding and appreciation.   All questions answered. Medication reconciliation performed and medication/allergy list updated.  Patient plans to pick-up his Ninlaro on Friday 02/15/2018 from the [facility], copayment $3 He will wait until 02/22/2018 (2 weeks after last Velcade dose) to start Ninlaro.  Patient knows to call the office with questions or concerns. Oral Oncology Clinic will continue to follow.  Thank you,  Robert Sparks, PharmD, BCPS, BCOP  02/13/2018 3:27 PM Oral Oncology Clinic [phone]

## 2018-02-14 MED FILL — NINLARO 3 MG CAPSULE: 3 | 28 days supply | Qty: 3 | Fill #0

## 2018-02-14 NOTE — Telephone Encounter (Signed)
Oral Oncology Pharmacist Encounter  Insurance authorization for Kennieth Rad has been approved [place] case [id number] Ref key: [id number] Effective dates: 02/13/2018-02/08/2019  Johny Drilling, PharmD, BCPS, BCOP  02/14/2018 9:21 AM Oral Oncology Clinic [phone]

## 2018-02-19 NOTE — Telephone Encounter (Signed)
Oral Oncology Patient Advocate Encounter  Confirmed with [place] that Robert Sparks was picked up and had a $3.00 copay.   [place] Patient [place] Phone [phone] Fax [phone]

## 2018-02-22 ENCOUNTER — Inpatient Hospital Stay: Payer: Medicare HMO

## 2018-02-22 ENCOUNTER — Ambulatory Visit: Payer: Medicare HMO | Admitting: Hematology

## 2018-02-22 ENCOUNTER — Encounter: Payer: Self-pay | Admitting: *Deleted

## 2018-02-22 ENCOUNTER — Other Ambulatory Visit: Payer: Self-pay | Admitting: Hematology

## 2018-02-22 DIAGNOSIS — R252 Cramp and spasm: Secondary | ICD-10-CM | POA: Diagnosis not present

## 2018-02-22 DIAGNOSIS — C9001 Multiple myeloma in remission: Secondary | ICD-10-CM

## 2018-02-22 DIAGNOSIS — R21 Rash and other nonspecific skin eruption: Secondary | ICD-10-CM | POA: Diagnosis not present

## 2018-02-22 DIAGNOSIS — F17210 Nicotine dependence, cigarettes, uncomplicated: Secondary | ICD-10-CM | POA: Diagnosis not present

## 2018-02-22 DIAGNOSIS — Z5112 Encounter for antineoplastic immunotherapy: Secondary | ICD-10-CM | POA: Diagnosis not present

## 2018-02-22 DIAGNOSIS — E1140 Type 2 diabetes mellitus with diabetic neuropathy, unspecified: Secondary | ICD-10-CM | POA: Diagnosis not present

## 2018-02-22 DIAGNOSIS — E1122 Type 2 diabetes mellitus with diabetic chronic kidney disease: Secondary | ICD-10-CM | POA: Diagnosis not present

## 2018-02-22 DIAGNOSIS — Z79899 Other long term (current) drug therapy: Secondary | ICD-10-CM | POA: Diagnosis not present

## 2018-02-22 LAB — CMP (CANCER CENTER ONLY)
ALK PHOS: 85 U/L (ref 38–126)
ALT: 17 U/L (ref 0–44)
AST: 19 U/L (ref 15–41)
Albumin: 4.1 g/dL (ref 3.5–5.0)
Anion gap: 9 (ref 5–15)
BUN: 28 mg/dL — AB (ref 8–23)
CALCIUM: 9.6 mg/dL (ref 8.9–10.3)
CHLORIDE: 105 mmol/L (ref 98–111)
CO2: 27 mmol/L (ref 22–32)
CREATININE: 1.5 mg/dL — AB (ref 0.61–1.24)
GFR, EST AFRICAN AMERICAN: 53 mL/min — AB (ref >60)
GFR, EST NON AFRICAN AMERICAN: 46 mL/min — AB (ref >60)
Glucose, Bld: 84 mg/dL (ref 70–99)
Potassium: 3.7 mmol/L (ref 3.5–5.1)
SODIUM: 141 mmol/L (ref 135–145)
Total Bilirubin: 0.5 mg/dL (ref 0.3–1.2)
Total Protein: 7 g/dL (ref 6.5–8.1)

## 2018-02-22 LAB — CBC WITH DIFFERENTIAL/PLATELET
BASOS PCT: 0 %
Basophils Absolute: 0 10*3/uL (ref 0.0–0.1)
EOS ABS: 0.6 10*3/uL — AB (ref 0.0–0.5)
EOS PCT: 13 %
HCT: 31.8 % — ABNORMAL LOW (ref 38.4–49.9)
HEMOGLOBIN: 10.6 g/dL — AB (ref 13.0–17.1)
LYMPHS ABS: 0.7 10*3/uL — AB (ref 0.9–3.3)
Lymphocytes Relative: 15 %
MCH: 33.7 pg — AB (ref 27.2–33.4)
MCHC: 33.3 g/dL (ref 32.0–36.0)
MCV: 101 fL — ABNORMAL HIGH (ref 79.3–98.0)
MONOS PCT: 10 %
Monocytes Absolute: 0.5 10*3/uL (ref 0.1–0.9)
NEUTROS PCT: 62 %
Neutro Abs: 3 10*3/uL (ref 1.5–6.5)
PLATELETS: 166 10*3/uL (ref 140–400)
RBC: 3.15 MIL/uL — ABNORMAL LOW (ref 4.20–5.82)
RDW: 13.6 % (ref 11.0–14.6)
WBC: 4.7 10*3/uL (ref 4.0–10.3)

## 2018-02-22 NOTE — Progress Notes (Signed)
Pharmacy notified that Velcade will no longer be administered, patient also notified of Dr. Grier Mitts recommendations.  No further needs at this time.  Patient discharged with no distress.

## 2018-02-22 NOTE — Patient Instructions (Signed)
[facility] Discharge Instructions for Patients Receiving Chemotherapy  Today you received the following chemotherapy agents Velcade.  To help prevent nausea and vomiting after your treatment, we encourage you to take your nausea medication as directed.  If you develop nausea and vomiting that is not controlled by your nausea medication, call the clinic.   BELOW ARE SYMPTOMS THAT SHOULD BE REPORTED IMMEDIATELY:  *FEVER GREATER THAN 100.5 F  *CHILLS WITH OR WITHOUT FEVER  NAUSEA AND VOMITING THAT IS NOT CONTROLLED WITH YOUR NAUSEA MEDICATION  *UNUSUAL SHORTNESS OF BREATH  *UNUSUAL BRUISING OR BLEEDING  TENDERNESS IN MOUTH AND THROAT WITH OR WITHOUT PRESENCE OF ULCERS  *URINARY PROBLEMS  *BOWEL PROBLEMS  UNUSUAL RASH Items with * indicate a potential emergency and should be followed up as soon as possible.  Feel free to call the clinic should you have any questions or concerns. The clinic phone number is [phone].  Please show the CHEMO ALERT CARD at check-in to the Emergency Department and triage nurse.   

## 2018-02-22 NOTE — Progress Notes (Unsigned)
cb

## 2018-02-22 NOTE — Progress Notes (Signed)
Per Dr Irene Limbo since patient started his Ninlaro on 02/18/2018 he does not need to get his Velcade today.  Infusion notified.

## 2018-03-08 ENCOUNTER — Inpatient Hospital Stay (HOSPITAL_BASED_OUTPATIENT_CLINIC_OR_DEPARTMENT_OTHER): Payer: Medicare HMO | Admitting: Oncology

## 2018-03-08 ENCOUNTER — Encounter: Payer: Self-pay | Admitting: Oncology

## 2018-03-08 ENCOUNTER — Inpatient Hospital Stay: Payer: Medicare HMO

## 2018-03-08 ENCOUNTER — Ambulatory Visit: Payer: Medicare HMO

## 2018-03-08 ENCOUNTER — Telehealth: Payer: Self-pay | Admitting: *Deleted

## 2018-03-08 ENCOUNTER — Telehealth: Payer: Self-pay

## 2018-03-08 ENCOUNTER — Other Ambulatory Visit: Payer: Self-pay | Admitting: *Deleted

## 2018-03-08 VITALS — BP 123/74 | HR 83 | Temp 98.7°F | Resp 18 | Ht 64.0 in | Wt 150.1 lb

## 2018-03-08 DIAGNOSIS — R252 Cramp and spasm: Secondary | ICD-10-CM | POA: Diagnosis not present

## 2018-03-08 DIAGNOSIS — L270 Generalized skin eruption due to drugs and medicaments taken internally: Secondary | ICD-10-CM

## 2018-03-08 DIAGNOSIS — E1122 Type 2 diabetes mellitus with diabetic chronic kidney disease: Secondary | ICD-10-CM | POA: Diagnosis not present

## 2018-03-08 DIAGNOSIS — Z79899 Other long term (current) drug therapy: Secondary | ICD-10-CM

## 2018-03-08 DIAGNOSIS — Z5112 Encounter for antineoplastic immunotherapy: Secondary | ICD-10-CM | POA: Diagnosis not present

## 2018-03-08 DIAGNOSIS — F17210 Nicotine dependence, cigarettes, uncomplicated: Secondary | ICD-10-CM | POA: Diagnosis not present

## 2018-03-08 DIAGNOSIS — C9001 Multiple myeloma in remission: Secondary | ICD-10-CM | POA: Diagnosis not present

## 2018-03-08 DIAGNOSIS — E1140 Type 2 diabetes mellitus with diabetic neuropathy, unspecified: Secondary | ICD-10-CM | POA: Diagnosis not present

## 2018-03-08 DIAGNOSIS — R21 Rash and other nonspecific skin eruption: Secondary | ICD-10-CM | POA: Diagnosis not present

## 2018-03-08 DIAGNOSIS — I10 Essential (primary) hypertension: Secondary | ICD-10-CM

## 2018-03-08 DIAGNOSIS — C9000 Multiple myeloma not having achieved remission: Secondary | ICD-10-CM

## 2018-03-08 LAB — CMP (CANCER CENTER ONLY)
ALT: 16 U/L (ref 0–44)
AST: 18 U/L (ref 15–41)
Albumin: 3.8 g/dL (ref 3.5–5.0)
Alkaline Phosphatase: 83 U/L (ref 38–126)
Anion gap: 9 (ref 5–15)
BILIRUBIN TOTAL: 0.3 mg/dL (ref 0.3–1.2)
BUN: 17 mg/dL (ref 8–23)
CALCIUM: 9.8 mg/dL (ref 8.9–10.3)
CHLORIDE: 106 mmol/L (ref 98–111)
CO2: 25 mmol/L (ref 22–32)
Creatinine: 1.3 mg/dL — ABNORMAL HIGH (ref 0.61–1.24)
GFR, EST NON AFRICAN AMERICAN: 54 mL/min — AB (ref >60)
Glucose, Bld: 72 mg/dL (ref 70–99)
Potassium: 3.7 mmol/L (ref 3.5–5.1)
Sodium: 140 mmol/L (ref 135–145)
TOTAL PROTEIN: 6.3 g/dL — AB (ref 6.5–8.1)

## 2018-03-08 LAB — CBC WITH DIFFERENTIAL (CANCER CENTER ONLY)
BASOS ABS: 0 10*3/uL (ref 0.0–0.1)
BASOS PCT: 0 %
EOS ABS: 0.8 10*3/uL — AB (ref 0.0–0.5)
EOS PCT: 15 %
HCT: 30.2 % — ABNORMAL LOW (ref 38.4–49.9)
HEMOGLOBIN: 10.1 g/dL — AB (ref 13.0–17.1)
LYMPHS ABS: 0.7 10*3/uL — AB (ref 0.9–3.3)
Lymphocytes Relative: 13 %
MCH: 33.1 pg (ref 27.2–33.4)
MCHC: 33.4 g/dL (ref 32.0–36.0)
MCV: 99 fL — ABNORMAL HIGH (ref 79.3–98.0)
Monocytes Absolute: 0.6 10*3/uL (ref 0.1–0.9)
Monocytes Relative: 12 %
NEUTROS PCT: 60 %
Neutro Abs: 3 10*3/uL (ref 1.5–6.5)
PLATELETS: 174 10*3/uL (ref 140–400)
RBC: 3.05 MIL/uL — ABNORMAL LOW (ref 4.20–5.82)
RDW: 13.3 % (ref 11.0–14.6)
WBC: 5.1 10*3/uL (ref 4.0–10.3)

## 2018-03-08 MED ORDER — TRIAMCINOLONE ACETONIDE 0.1 % EX LOTN: 1.0000 "application " | TOPICAL_LOTION | Freq: Three times a day (TID) | CUTANEOUS | 2 refills | Status: DC | PRN

## 2018-03-08 NOTE — Progress Notes (Signed)
[name] OFFICE PROGRESS NOTE  [name], MD [place] [address]  DIAGNOSIS: IgA lambda multiple myeloma, ISS stage I  PRIOR THERAPY: lenalidomide, bortezomib, and low-dose dexamethasone based on the anticipated tolerance by the patient. Lenalidomide was started at 10 mg daily based on creatinine clearance of 42, dexamethasone dose was reduced to 20 mg weekly based on patient's age. Treatment was complicated by rapid development of cutaneous rash attributed to lenalidomide, inaddition to decreased renal function and now patient has persistent severe anemia. Side-effects were attributed to Lenalidomide and lenalidomide was discontinued with subsequent recovery. Patient has been receiving bortezomib weekly with low-dose dexamethasone in 4-day cycles.  Patient has completed induction systemic therapy for his disease with repeat bone marrow biopsy confirming complete response including no evidence of minimal residual disease by cytogenetics or FISH.  CURRENT THERAPY: Ninlaro 3 mg by mouth on day 1, day 8, and day 15 of a 28-day cycle.  First dose was given on 02/18/2018.  INTERVAL HISTORY: Robert Sparks 72 y.o. male returns for routine follow-up visit by himself.  The patient is feeling fine today and has no specific complaints except for mild fatigue.  He denies fevers and chills.  Denies chest pain, shortness of breath, cough, hemoptysis.  Denies nausea, vomiting, constipation, diarrhea.  Denies recent weight loss or night sweats.  He reports intermittent itching to his chest and his right leg.  He is tolerating his treatment fairly well.  The patient is here for evaluation and repeat lab work.  MEDICAL HISTORY: Past Medical History:  Diagnosis Date  . Anemia   . Arthritis    shoulders,feet   . Cataract    per eye dr -has appt 5-11  . CKD (chronic kidney disease), stage III ([place])   . Elevated PSA   . History of ketoacidosis    03-29-2015    . History of sepsis    07-25-2013  non-compliant w/ medication  . Hyperlipidemia   . Hypertension   . Nocturia   . Peripheral neuropathy   . Prostate cancer ([place])   . Type 2 diabetes mellitus with insulin therapy ([place])     ALLERGIES:  has No Known Allergies.  MEDICATIONS:  Current Outpatient Medications  Medication Sig Dispense Refill  . ACCU-CHEK AVIVA PLUS test strip 1 each by Other route See admin instructions. 100 each 12  . acyclovir (ZOVIRAX) 400 MG tablet TAKE 1 TABLET BY MOUTH 2 TIMES DAILY. 60 tablet 3  . amLODipine (NORVASC) 5 MG tablet Take 1 tablet (5 mg total) by mouth daily. 90 tablet 3  . aspirin EC 81 MG tablet Take 1 tablet (81 mg total) by mouth daily. 90 tablet 3  . Cholecalciferol (VITAMIN D) 2000 units CAPS Take 2,000 Units by mouth daily.    . diclofenac sodium (VOLTAREN) 1 % GEL Apply 2 g topically 4 (four) times daily. 100 g 2  . DULoxetine (CYMBALTA) 20 MG capsule Take 1 capsule (20 mg total) by mouth daily. 30 capsule 3  . gabapentin (NEURONTIN) 300 MG capsule TAKE 1 CAPSULE BY MOUTH IN THE MORNING AND AT LUNCH, TAKE 2 CAPSULES BY MOUTH AT NIGHT. 120 capsule 4  . glucose blood test strip See admin instructions.    . insulin aspart protamine - aspart (NOVOLOG 70/30 MIX) (70-30) 100 UNIT/ML FlexPen Inject 0.09 mLs (9 Units total) into the skin 2 (two) times daily with a meal. 15 mL 6  . Insulin Pen Needle (TRUEPLUS PEN NEEDLES) 32G  X 4 MM MISC Use as directed twice daily with insulin 100 each 3  . ixazomib citrate (NINLARO) 3 MG capsule Take 1 capsule by mouth on Day 1, Day 8 & D15 of each 28d cycle. Take on empty stomach 1hr before or 2hrs after food. Do not crush,chew,open 3 capsule 1  . lidocaine-prilocaine (EMLA) cream Apply to affected area once 30 g 3  . mometasone (ELOCON) 0.1 % cream Apply 1 application topically daily. 45 g 3  . Multiple Vitamin (MULTIVITAMIN WITH MINERALS) TABS tablet Take 1 tablet by mouth daily. 60 tablet 2  . ondansetron (ZOFRAN) 8  MG tablet Take 1 tablet (8 mg total) by mouth 2 (two) times daily as needed (Nausea or vomiting). 30 tablet 1  . pravastatin (PRAVACHOL) 20 MG tablet Take 1 tablet (20 mg total) by mouth daily. 90 tablet 3  . prochlorperazine (COMPAZINE) 10 MG tablet Take 1 tablet (10 mg total) by mouth every 6 (six) hours as needed (Nausea or vomiting). 30 tablet 1  . senna-docusate (SENOKOT-S) 8.6-50 MG tablet Take 2 tablets by mouth 2 (two) times daily. 60 tablet 2  . triamcinolone lotion (KENALOG) 0.1 % Apply 1 application topically 3 (three) times daily as needed. 60 mL 2   Current Facility-Administered Medications  Medication Dose Route Frequency Provider Last Rate Last Dose  . 0.9 %  sodium chloride infusion  500 mL Intravenous Continuous Pyrtle, Lajuan Lines, MD        SURGICAL HISTORY:  Past Surgical History:  Procedure Laterality Date  . CIRCUMCISION  1990's  . PROSTATE BIOPSY N/A 12/21/2015   Procedure: BIOPSY TRANSRECTAL ULTRASONIC PROSTATE (TUBP);  Surgeon: Festus Aloe, MD;  Location: [facility];  Service: Urology;  Laterality: N/A;    REVIEW OF SYSTEMS:   Review of Systems  Constitutional: Negative for appetite change, chills, fever and unexpected weight change.  Positive for mild fatigue. HENT:   Negative for mouth sores, nosebleeds, sore throat and trouble swallowing.   Eyes: Negative for eye problems and icterus.  Respiratory: Negative for cough, hemoptysis, shortness of breath and wheezing.   Cardiovascular: Negative for chest pain and leg swelling.  Gastrointestinal: Negative for abdominal pain, constipation, diarrhea, nausea and vomiting.  Genitourinary: Negative for bladder incontinence, difficulty urinating, dysuria, frequency and hematuria.   Musculoskeletal: Negative for back pain, gait problem, neck pain and neck stiffness.  Skin: Positive for itching. Neurological: Negative for dizziness, extremity weakness, gait problem, headaches, light-headedness and seizures.   Hematological: Negative for adenopathy. Does not bruise/bleed easily.  Psychiatric/Behavioral: Negative for confusion, depression and sleep disturbance. The patient is not nervous/anxious.     PHYSICAL EXAMINATION:  Blood pressure 123/74, pulse 83, temperature 98.7 F (37.1 C), temperature source Oral, resp. rate 18, height '5\' 4"'  (1.626 m), weight 150 lb 1.6 oz (68.1 kg), SpO2 100 %.  ECOG PERFORMANCE STATUS: 1 - Symptomatic but completely ambulatory  Physical Exam  Constitutional: Oriented to person, place, and time and well-developed, well-nourished, and in no distress. No distress.  HENT:  Head: Normocephalic and atraumatic.  Mouth/Throat: Oropharynx is clear and moist. No oropharyngeal exudate.  Eyes: Conjunctivae are normal. Right eye exhibits no discharge. Left eye exhibits no discharge. No scleral icterus.  Neck: Normal range of motion. Neck supple.  Cardiovascular: Normal rate, regular rhythm, normal heart sounds and intact distal pulses.   Pulmonary/Chest: Effort normal and breath sounds normal. No respiratory distress. No wheezes. No rales.  Abdominal: Soft. Bowel sounds are normal. Exhibits no distension and no mass.  There is no tenderness.  Musculoskeletal: Normal range of motion. Exhibits no edema.  Lymphadenopathy:    No cervical adenopathy.  Neurological: Alert and oriented to person, place, and time. Exhibits normal muscle tone. Gait normal. Coordination normal.  Skin: Skin is warm and dry.  Dry skin noted to chest and upper arms.  He has a faint 1 cm rash to his right thigh..  Psychiatric: Mood, memory and judgment normal.  Vitals reviewed.  LABORATORY DATA: Lab Results  Component Value Date   WBC 5.1 03/08/2018   HGB 10.1 (L) 03/08/2018   HCT 30.2 (L) 03/08/2018   MCV 99.0 (H) 03/08/2018   PLT 174 03/08/2018      Chemistry      Component Value Date/Time   NA 140 03/08/2018 1109   NA 138 12/06/2017 0920   NA 137 07/13/2017 0938   K 3.7 03/08/2018 1109    K 3.3 (L) 07/13/2017 0938   CL 106 03/08/2018 1109   CO2 25 03/08/2018 1109   CO2 25 07/13/2017 0938   BUN 17 03/08/2018 1109   BUN 42 (H) 12/06/2017 0920   BUN 23.8 07/13/2017 0938   CREATININE 1.30 (H) 03/08/2018 1109   CREATININE 1.7 (H) 07/13/2017 0938      Component Value Date/Time   CALCIUM 9.8 03/08/2018 1109   CALCIUM 9.3 07/13/2017 0938   ALKPHOS 83 03/08/2018 1109   ALKPHOS 119 07/13/2017 0938   AST 18 03/08/2018 1109   AST 14 07/13/2017 0938   ALT 16 03/08/2018 1109   ALT 13 07/13/2017 0938   BILITOT 0.3 03/08/2018 1109   BILITOT 0.45 07/13/2017 0938       RADIOGRAPHIC STUDIES:  No results found.   ASSESSMENT/PLAN:  Multiple myeloma in remission [facility]) This is a pleasant 72 year old African-American male with IgA lambda multiple myeloma, ISS stage I-currently in remission.  He is currently on Ninlaro and tolerating it well.  Labs reviewed and remain stable.  Multiple myeloma panel and light chains are pending.  Recommend that he continue on the current course of therapy. For the itching and rash, I recommend that he use Benadryl on as-needed basis.  He was also given a prescription for triamcinolone cream to be used for itching on as-needed basis. The patient will follow-up in 4 weeks for evaluation and repeat lab work.   Orders Placed This Encounter  Procedures  . CBC with Differential (Cancer Center Only)    Standing Status:   Future    Number of Occurrences:   1    Standing Expiration Date:   03/09/2019  . CMP ([facility] only)    Standing Status:   Future    Number of Occurrences:   1    Standing Expiration Date:   03/09/2019  . CBC with Differential (Cancer Center Only)    Standing Status:   Future    Standing Expiration Date:   03/09/2019  . CMP ([facility] only)    Standing Status:   Future    Standing Expiration Date:   03/09/2019     Mikey Bussing, DNP, AGPCNP-BC, AOCNP 03/08/18

## 2018-03-08 NOTE — Telephone Encounter (Signed)
Printed avs and calender of Social worker. Per 8/30 los

## 2018-03-08 NOTE — Telephone Encounter (Signed)
Patient verified DOB Patient will complete lipid testing on 03/21/18 11:10 FU visit with PCP

## 2018-03-08 NOTE — Assessment & Plan Note (Signed)
This is a pleasant 72 year old African-American male with IgA lambda multiple myeloma, ISS stage I-currently in remission.  He is currently on Ninlaro and tolerating it well.  Labs reviewed and remain stable.  Multiple myeloma panel and light chains are pending.  Recommend that he continue on the current course of therapy. For the itching and rash, I recommend that he use Benadryl on as-needed basis.  He was also given a prescription for triamcinolone cream to be used for itching on as-needed basis. The patient will follow-up in 4 weeks for evaluation and repeat lab work.

## 2018-03-11 LAB — MULTIPLE MYELOMA PANEL, SERUM
ALBUMIN/GLOB SERPL: 1.6 (ref 0.7–1.7)
ALPHA 1: 0.2 g/dL (ref 0.0–0.4)
ALPHA2 GLOB SERPL ELPH-MCNC: 0.7 g/dL (ref 0.4–1.0)
Albumin SerPl Elph-Mcnc: 3.8 g/dL (ref 2.9–4.4)
B-GLOBULIN SERPL ELPH-MCNC: 0.8 g/dL (ref 0.7–1.3)
Gamma Glob SerPl Elph-Mcnc: 0.7 g/dL (ref 0.4–1.8)
Globulin, Total: 2.5 g/dL (ref 2.2–3.9)
IGG (IMMUNOGLOBIN G), SERUM: 799 mg/dL (ref 700–1600)
IgA: 120 mg/dL (ref 61–437)
IgM (Immunoglobulin M), Srm: 58 mg/dL (ref 20–172)
TOTAL PROTEIN ELP: 6.3 g/dL (ref 6.0–8.5)

## 2018-03-12 ENCOUNTER — Other Ambulatory Visit: Payer: Self-pay | Admitting: Internal Medicine

## 2018-03-12 LAB — KAPPA/LAMBDA LIGHT CHAINS
KAPPA FREE LGHT CHN: 48.7 mg/L — AB (ref 3.3–19.4)
KAPPA, LAMDA LIGHT CHAIN RATIO: 2.69 — AB (ref 0.26–1.65)
LAMDA FREE LIGHT CHAINS: 18.1 mg/L (ref 5.7–26.3)

## 2018-03-12 MED FILL — NOVOLOG MIX 70-30 FLEXPEN S: (70-30) 100 | 32 days supply | Qty: 6 | Fill #5

## 2018-03-12 MED FILL — GABAPENTIN 300 MG CAPSULE: 300 | 30 days supply | Qty: 120 | Fill #0

## 2018-03-12 MED FILL — DULoxetine HCL 20 MG CPEP: 20 | 30 days supply | Qty: 30 | Fill #2

## 2018-03-12 MED FILL — ACCU-CHEK AVIVA PLUS TEST S: 30 days supply | Qty: 100 | Fill #6

## 2018-03-14 MED FILL — NINLARO 3 MG CAPSULE: 3 | 28 days supply | Qty: 3 | Fill #1

## 2018-03-21 ENCOUNTER — Encounter: Payer: Self-pay | Admitting: Internal Medicine

## 2018-03-21 ENCOUNTER — Ambulatory Visit: Payer: Medicare HMO | Admitting: Internal Medicine

## 2018-03-21 ENCOUNTER — Ambulatory Visit: Payer: Medicare HMO | Attending: Internal Medicine | Admitting: Internal Medicine

## 2018-03-21 VITALS — BP 108/73 | HR 71 | Temp 98.4°F | Resp 16 | Wt 148.0 lb

## 2018-03-21 DIAGNOSIS — E538 Deficiency of other specified B group vitamins: Secondary | ICD-10-CM | POA: Insufficient documentation

## 2018-03-21 DIAGNOSIS — Z8546 Personal history of malignant neoplasm of prostate: Secondary | ICD-10-CM

## 2018-03-21 DIAGNOSIS — E1136 Type 2 diabetes mellitus with diabetic cataract: Secondary | ICD-10-CM | POA: Insufficient documentation

## 2018-03-21 DIAGNOSIS — Z79899 Other long term (current) drug therapy: Secondary | ICD-10-CM | POA: Insufficient documentation

## 2018-03-21 DIAGNOSIS — Z9889 Other specified postprocedural states: Secondary | ICD-10-CM | POA: Insufficient documentation

## 2018-03-21 DIAGNOSIS — E1142 Type 2 diabetes mellitus with diabetic polyneuropathy: Secondary | ICD-10-CM | POA: Insufficient documentation

## 2018-03-21 DIAGNOSIS — E1122 Type 2 diabetes mellitus with diabetic chronic kidney disease: Secondary | ICD-10-CM | POA: Diagnosis not present

## 2018-03-21 DIAGNOSIS — E876 Hypokalemia: Secondary | ICD-10-CM | POA: Insufficient documentation

## 2018-03-21 DIAGNOSIS — Z794 Long term (current) use of insulin: Secondary | ICD-10-CM | POA: Diagnosis not present

## 2018-03-21 DIAGNOSIS — N183 Chronic kidney disease, stage 3 (moderate): Secondary | ICD-10-CM | POA: Diagnosis not present

## 2018-03-21 DIAGNOSIS — I10 Essential (primary) hypertension: Secondary | ICD-10-CM

## 2018-03-21 DIAGNOSIS — C9001 Multiple myeloma in remission: Secondary | ICD-10-CM | POA: Diagnosis not present

## 2018-03-21 DIAGNOSIS — Z7982 Long term (current) use of aspirin: Secondary | ICD-10-CM | POA: Insufficient documentation

## 2018-03-21 DIAGNOSIS — M5136 Other intervertebral disc degeneration, lumbar region: Secondary | ICD-10-CM | POA: Diagnosis not present

## 2018-03-21 DIAGNOSIS — M1711 Unilateral primary osteoarthritis, right knee: Secondary | ICD-10-CM | POA: Insufficient documentation

## 2018-03-21 DIAGNOSIS — E785 Hyperlipidemia, unspecified: Secondary | ICD-10-CM | POA: Insufficient documentation

## 2018-03-21 DIAGNOSIS — I129 Hypertensive chronic kidney disease with stage 1 through stage 4 chronic kidney disease, or unspecified chronic kidney disease: Secondary | ICD-10-CM | POA: Insufficient documentation

## 2018-03-21 DIAGNOSIS — K5900 Constipation, unspecified: Secondary | ICD-10-CM | POA: Insufficient documentation

## 2018-03-21 DIAGNOSIS — D638 Anemia in other chronic diseases classified elsewhere: Secondary | ICD-10-CM | POA: Insufficient documentation

## 2018-03-21 DIAGNOSIS — C61 Malignant neoplasm of prostate: Secondary | ICD-10-CM | POA: Insufficient documentation

## 2018-03-21 DIAGNOSIS — F17210 Nicotine dependence, cigarettes, uncomplicated: Secondary | ICD-10-CM | POA: Insufficient documentation

## 2018-03-21 DIAGNOSIS — F1010 Alcohol abuse, uncomplicated: Secondary | ICD-10-CM | POA: Insufficient documentation

## 2018-03-21 LAB — GLUCOSE, POCT (MANUAL RESULT ENTRY): POC GLUCOSE: 128 mg/dL — AB (ref 70–99)

## 2018-03-21 LAB — POCT GLYCOSYLATED HEMOGLOBIN (HGB A1C): HBA1C, POC (PREDIABETIC RANGE): 6 % (ref 5.7–6.4)

## 2018-03-21 MED ORDER — INSULIN ASPART PROT & ASPART (70-30 MIX) 100 UNIT/ML PEN: 7.0000 [IU] | PEN_INJECTOR | Freq: Two times a day (BID) | SUBCUTANEOUS | 6 refills | Status: DC

## 2018-03-21 MED ORDER — POLYETHYLENE GLYCOL 3350 17 GM/SCOOP PO POWD: 17.0000 g | ORAL | 1 refills | Status: DC | PRN

## 2018-03-21 MED FILL — TRIAMCINOLONE 0.1% LOTION: 0.1 | 20 days supply | Qty: 60 | Fill #0

## 2018-03-21 NOTE — Progress Notes (Signed)
Patient ID: Robert Sparks, male    DOB: May 23, 1946  MRN: [id number]  CC: Chronic disease management  Subjective: Robert Sparks is a 72 y.o. male who presents for chronic disease management.  His next appointment with me was supposed to be in October but patient states that he requested to have it moved up because he has several other appointments next month. His concerns today include: Patient with history of HTN, diabetes type 2,HL,CKD stage3, tobacco dependence, EtOH abuse, prostate CA, OA knee, vit B 12defand multiple myeloma .  Wgh has stayed stable since 12/2017. Good appetite.  He is sleeping well.  No falls.  His only complaint is feeling of tiredness in his lower back when he walks.  No pain going down the legs.  Prostate CA:  Has appt with urologist next mth.  He has completed treatment  DM: checks BS 3 x day.   Gives range 90-120.  Occasional lows which he can feel.  He usually keeps some candy in his pocket when he is away from home.  BS was 56 a few nights ago. On Novolog 70/30 9 units BID  CKD stage 3: Kidney function not improved on last chemistry done 2 weeks ago by his oncologist.  Creatinine previously was 1.5, now 1.3 and GFR previously 53 now greater than 60.   Patient Active Problem List   Diagnosis Date Noted  . Primary osteoarthritis of right knee 01/07/2018  . Diabetic polyneuropathy associated with type 2 diabetes mellitus ([place]) 01/07/2018  . Vitamin B12 deficiency 09/04/2017  . Pre-ulcerative corn or callous 04/12/2017  . Cataract of both eyes 04/12/2017  . Tobacco dependence 04/12/2017  . Hypokalemia 03/23/2017  . Multiple myeloma in remission ([place]) 02/23/2017  . Oral leukoplakia 02/09/2017  . Lightheadedness 11/10/2016  . Tobacco abuse 06/21/2016  . Hyperlipidemia 06/21/2016  . CKD (chronic kidney disease) stage 3, GFR 30-59 ml/min ([facility]) 02/23/2016  . Malignant neoplasm of prostate ([place]) 02/21/2016  . Controlled type 2 diabetes mellitus with  stage 3 chronic kidney disease, with long-term current use of insulin ([place]) 03/29/2015  . HTN (hypertension) 07/28/2013  . Anemia, chronic disease 07/26/2013  . ETOH abuse 07/25/2013     Current Outpatient Medications on File Prior to Visit  Medication Sig Dispense Refill  . ACCU-CHEK AVIVA PLUS test strip 1 each by Other route See admin instructions. 100 each 12  . acyclovir (ZOVIRAX) 400 MG tablet TAKE 1 TABLET BY MOUTH 2 TIMES DAILY. 60 tablet 3  . amLODipine (NORVASC) 5 MG tablet Take 1 tablet (5 mg total) by mouth daily. 90 tablet 3  . aspirin EC 81 MG tablet Take 1 tablet (81 mg total) by mouth daily. 90 tablet 3  . Cholecalciferol (VITAMIN D) 2000 units CAPS Take 2,000 Units by mouth daily.    . diclofenac sodium (VOLTAREN) 1 % GEL Apply 2 g topically 4 (four) times daily. 100 g 2  . DULoxetine (CYMBALTA) 20 MG capsule Take 1 capsule (20 mg total) by mouth daily. 30 capsule 3  . gabapentin (NEURONTIN) 300 MG capsule TAKE 1 CAPSULE BY MOUTH IN THE MORNING AND AT LUNCH, TAKE 2 CAPSULES BY MOUTH AT NIGHT. 120 capsule 4  . glucose blood test strip See admin instructions.    . Insulin Pen Needle (TRUEPLUS PEN NEEDLES) 32G X 4 MM MISC Use as directed twice daily with insulin 100 each 3  . ixazomib citrate (NINLARO) 3 MG capsule Take 1 capsule by mouth on Day 1, Day 8 &  D15 of each 28d cycle. Take on empty stomach 1hr before or 2hrs after food. Do not crush,chew,open 3 capsule 1  . lidocaine-prilocaine (EMLA) cream Apply to affected area once 30 g 3  . mometasone (ELOCON) 0.1 % cream Apply 1 application topically daily. 45 g 3  . Multiple Vitamin (MULTIVITAMIN WITH MINERALS) TABS tablet Take 1 tablet by mouth daily. 60 tablet 2  . ondansetron (ZOFRAN) 8 MG tablet Take 1 tablet (8 mg total) by mouth 2 (two) times daily as needed (Nausea or vomiting). 30 tablet 1  . pravastatin (PRAVACHOL) 20 MG tablet Take 1 tablet (20 mg total) by mouth daily. 90 tablet 3  . prochlorperazine (COMPAZINE) 10 MG  tablet Take 1 tablet (10 mg total) by mouth every 6 (six) hours as needed (Nausea or vomiting). 30 tablet 1  . triamcinolone lotion (KENALOG) 0.1 % Apply 1 application topically 3 (three) times daily as needed. 60 mL 2   Current Facility-Administered Medications on File Prior to Visit  Medication Dose Route Frequency Provider Last Rate Last Dose  . 0.9 %  sodium chloride infusion  500 mL Intravenous Continuous Pyrtle, Lajuan Lines, MD        No Known Allergies  Social History   Socioeconomic History  . Marital status: Single    Spouse name: Not on file  . Number of children: Not on file  . Years of education: Not on file  . Highest education level: Not on file  Occupational History  . Not on file  Social Needs  . Financial resource strain: Not on file  . Food insecurity:    Worry: Not on file    Inability: Not on file  . Transportation needs:    Medical: Not on file    Non-medical: Not on file  Tobacco Use  . Smoking status: Current Every Day Smoker    Packs/day: 0.25    Years: 50.00    Pack years: 12.50    Types: Cigarettes  . Smokeless tobacco: Never Used  . Tobacco comment: 1 ppwk-4-5 a day   Substance and Sexual Activity  . Alcohol use: Yes    Alcohol/week: 1.0 standard drinks    Types: 1 Cans of beer per week    Comment: 1 every day-quit couple weeks ago  . Drug use: No  . Sexual activity: Not Currently  Lifestyle  . Physical activity:    Days per week: Not on file    Minutes per session: Not on file  . Stress: Not on file  Relationships  . Social connections:    Talks on phone: Not on file    Gets together: Not on file    Attends religious service: Not on file    Active member of club or organization: Not on file    Attends meetings of clubs or organizations: Not on file    Relationship status: Not on file  . Intimate partner violence:    Fear of current or ex partner: Not on file    Emotionally abused: Not on file    Physically abused: Not on file    Forced  sexual activity: Not on file  Other Topics Concern  . Not on file  Social History Narrative  . Not on file    Family History  Problem Relation Age of Onset  . Cancer Neg Hx   . Colon cancer Neg Hx   . Colon polyps Neg Hx   . Esophageal cancer Neg Hx   . Rectal cancer Neg  Hx   . Stomach cancer Neg Hx     Past Surgical History:  Procedure Laterality Date  . CIRCUMCISION  1990's  . PROSTATE BIOPSY N/A 12/21/2015   Procedure: BIOPSY TRANSRECTAL ULTRASONIC PROSTATE (TUBP);  Surgeon: Festus Aloe, MD;  Location: [facility];  Service: Urology;  Laterality: N/A;    ROS: Review of Systems  Gastrointestinal: Positive for constipation.  Genitourinary:       Some difficulty at times getting his flow started.    PHYSICAL EXAM: BP 108/73   Pulse 71   Temp 98.4 F (36.9 C) (Oral)   Resp 16   Wt 148 lb (67.1 kg)   SpO2 97%   BMI 25.40 kg/m   Wt Readings from Last 3 Encounters:  03/21/18 148 lb (67.1 kg)  03/08/18 150 lb 1.6 oz (68.1 kg)  02/08/18 147 lb 4.8 oz (66.8 kg)    Physical Exam  General appearance - alert, well appearing, elderly male and in no distress Mental status - normal mood, behavior, speech, dress, motor activity, and thought processes Neck - supple, no significant adenopathy Chest - clear to auscultation, no wheezes, rales or rhonchi, symmetric air entry Heart - normal rate, regular rhythm, normal S1, S2, no murmurs, rubs, clicks or gallops Musculoskeletal -gait is steady and stable.  No tenderness on palpation of the lumbar spine. Extremities -no lower extremity edema.  Lab Results  Component Value Date   WBC 5.1 03/08/2018   HGB 10.1 (L) 03/08/2018   HCT 30.2 (L) 03/08/2018   MCV 99.0 (H) 03/08/2018   PLT 174 03/08/2018     Chemistry      Component Value Date/Time   NA 140 03/08/2018 1109   NA 138 12/06/2017 0920   NA 137 07/13/2017 0938   K 3.7 03/08/2018 1109   K 3.3 (L) 07/13/2017 0938   CL 106 03/08/2018 1109   CO2  25 03/08/2018 1109   CO2 25 07/13/2017 0938   BUN 17 03/08/2018 1109   BUN 42 (H) 12/06/2017 0920   BUN 23.8 07/13/2017 0938   CREATININE 1.30 (H) 03/08/2018 1109   CREATININE 1.7 (H) 07/13/2017 0938      Component Value Date/Time   CALCIUM 9.8 03/08/2018 1109   CALCIUM 9.3 07/13/2017 0938   ALKPHOS 83 03/08/2018 1109   ALKPHOS 119 07/13/2017 0938   AST 18 03/08/2018 1109   AST 14 07/13/2017 0938   ALT 16 03/08/2018 1109   ALT 13 07/13/2017 0938   BILITOT 0.3 03/08/2018 1109   BILITOT 0.45 07/13/2017 0938     Lab Results  Component Value Date   HGBA1C 6.0 03/21/2018    ASSESSMENT AND PLAN: 1. Controlled type 2 diabetes mellitus with stage 3 chronic kidney disease, with long-term current use of insulin ([facility]) Given his recent episode of blood sugars in the 50s, I recommend decreasing NovoLog 70/30 from 9 units to 7 units twice a day.  Continue to monitor blood sugars.  I told him that he can cut back from checking his sugars 3 times a day to twice a day. - POCT glucose (manual entry) - POCT glycosylated hemoglobin (Hb A1C)  2. Essential hypertension At goal.  Continue amlodipine - Lipid panel  3. Constipation, unspecified constipation type - polyethylene glycol powder (GLYCOLAX/MIRALAX) powder; Take 17 g by mouth as needed for moderate constipation.  Dispense: 3350 g; Refill: 1  4. Degenerative disc disease, lumbar Advised patient to use Tylenol 325 mg twice a day as needed.  This can be  purchased over-the-counter  5. Multiple myeloma in remission ([place]) In remission and followed by oncology. We will hold off on giving flu shot and pneumonia vaccine since patient still receiving maintenance treatment.  Advised him to ask his oncologist when he sees him next month about whether it would be worth giving the shots at this time  6. History of prostate cancer Keep appointments with urology   Patient was given the opportunity to ask questions.  Patient verbalized  understanding of the plan and was able to repeat key elements of the plan.   Orders Placed This Encounter  Procedures  . POCT glucose (manual entry)  . POCT glycosylated hemoglobin (Hb A1C)     Requested Prescriptions   Signed Prescriptions Disp Refills  . polyethylene glycol powder (GLYCOLAX/MIRALAX) powder 3350 g 1    Sig: Take 17 g by mouth as needed for moderate constipation.  . insulin aspart protamine - aspart (NOVOLOG 70/30 MIX) (70-30) 100 UNIT/ML FlexPen 15 mL 6    Sig: Inject 0.07 mLs (7 Units total) into the skin 2 (two) times daily with a meal.    Return in about 3 months (around 06/20/2018).  Karle Plumber, MD, FACP

## 2018-03-21 NOTE — Patient Instructions (Signed)
Okay to use Tylenol 325 mg twice a day as needed for back pain.  Use the MiraLAX as needed for constipation.  Decrease the NovoLog insulin to 7 units twice a day.  When you see your cancer specialist next month inquire whether it is worth giving you the flu shot and pneumonia vaccine.  People who were receiving cancer treatment may not have a full response to the vaccines.

## 2018-03-21 NOTE — Progress Notes (Signed)
Pt states he is still feeling tired when he is walking

## 2018-03-22 LAB — LIPID PANEL
CHOLESTEROL TOTAL: 204 mg/dL — AB (ref 100–199)
Chol/HDL Ratio: 3 ratio (ref 0.0–5.0)
HDL: 68 mg/dL (ref >39)
LDL CALC: 107 mg/dL — AB (ref 0–99)
Triglycerides: 143 mg/dL (ref 0–149)
VLDL Cholesterol Cal: 29 mg/dL (ref 5–40)

## 2018-03-27 ENCOUNTER — Telehealth: Payer: Self-pay

## 2018-03-27 ENCOUNTER — Other Ambulatory Visit: Payer: Self-pay

## 2018-03-27 DIAGNOSIS — M65341 Trigger finger, right ring finger: Secondary | ICD-10-CM | POA: Diagnosis not present

## 2018-03-27 MED ORDER — PNEUMOCOCCAL 13-VAL CONJ VACC IM SUSP: 0.5000 mL | INTRAMUSCULAR | 0 refills | Status: AC

## 2018-03-27 MED FILL — PREVNAR 13 SYRINGE: 1 days supply | Qty: 1 | Fill #0

## 2018-03-27 NOTE — Telephone Encounter (Signed)
Contacted pt to go over lab results pt didn't answer lvm asking pt to give me a call at his earliest convenience to go over results and to schedule him a nurse visit for injections    If pt calls back please give results and scheduled and appointment: LDL cholesterol is 107 with goal being less than 70. Is he taking the Pravastatin consistently? If he is taking it, then we need to increase the dose from 20 mg daily to 40 mg daily. Please let me know so that I can send new rxn to pharmacy.    I emailed his oncologist about whether we can give the flu and Prevnar 13 vaccines now that he is in remission from myeloma. He said it is safe to do so. Please set him up as nurse only visit to receive these two vaccines if he wants to have them.

## 2018-04-02 ENCOUNTER — Other Ambulatory Visit: Payer: Self-pay | Admitting: Hematology

## 2018-04-02 DIAGNOSIS — C9000 Multiple myeloma not having achieved remission: Secondary | ICD-10-CM

## 2018-04-03 ENCOUNTER — Other Ambulatory Visit: Payer: Self-pay

## 2018-04-03 DIAGNOSIS — C9000 Multiple myeloma not having achieved remission: Secondary | ICD-10-CM

## 2018-04-03 MED ORDER — IXAZOMIB CITRATE 3 MG PO CAPS: ORAL_CAPSULE | ORAL | 1 refills | Status: DC

## 2018-04-04 MED FILL — NINLARO 3 MG CAPSULE: 3 | 28 days supply | Qty: 3 | Fill #0

## 2018-04-05 NOTE — Progress Notes (Signed)
HEMATOLOGY/ONCOLOGY CLINIC NOTE  Date of Service: 04/08/2018  Patient Care Team: [name], MD as PCP - General (Internal Medicine) [name], MD as Consulting Physician (Hematology and Oncology)  CHIEF COMPLAINTS/PURPOSE OF CONSULTATION:  F/u for continued mx of Multiple Myeloma  Oncologic History:  72 y.o. male with diagnosis of IgA lambda active multiple myeloma, ISS Stage I. Active disease diagnosed based on presence of anemia, kidney insufficiency, and paraproteinemia with significant predominance of lambda light chains as well as significant elevation of IgA. Bone marrow biopsy confirmed presence of atypical monoclonal plasma cell process in the bone marrow comprising at least 7% of the cellularity. Based on the findings, patient was started on treatment with lenalidomide, bortezomib, and low-dose dexamethasone based on the anticipated tolerance by the patient. Lenalidomide was started at 10 mg daily based on creatinine clearance of 42, dexamethasone dose was reduced to 20 mg weekly based on patient's age. Treatment was complicated by rapid development of cutaneous rash attributed to lenalidomide, inaddition to decreased renal function and now patient has persistent severe anemia. Side-effects were attributed to Lenalidomide and lenalidomide was discontinued with subsequent recovery.  Patient has been receiving bortezomib weekly with low-dose dexamethasone in 4-day cycles.  Patient has completed induction systemic therapy for his disease with repeat bone marrow biopsy confirming complete response including no evidence of minimal residual disease by cytogenetics or FISH.  HISTORY OF PRESENTING ILLNESS:   Robert Sparks is a wonderful 72 y.o. male who has been referred to [place] by my colleague Dr Grace Isaac for evaluation and management of Multiple Myeloma. The pt reports that he is doing well overall.   The pt has been previously followed by my colleague Dr Grace Isaac. The pt was treated with Revlimid, Velcade, and dexamethasone and was subsequently switched to Velcade and Dexamethasone after a Revlimid intolerance.   The pt reports some knee aches and skin soreness in his legs which is worse at night. He notes that he has had diabetic-related peripheral neuropathy. The pt has not seen [facility] yet for a discussion of a BM transplant. He continues taking Acyclovir and notes no difficulty taking maintenance Velcade.   He adds that he takes Gabapentin and sweats.  He notes that his neuropathy is currently stable and denies it worsening.   He notes that a pack of cigarettes lasts him for about 3 days.   Most recent lab results (12/14/17) of CBC and CMP  is as follows: all values are WNL except for RBC at 3.08, HGB at 10.1, HCT at 31.0, MCV at 100.6, Eosinophils Abs at 1.4k, Potassium at 3.4, Glucose at 156, Creatinine at 1.60. LDH 12/14/17 is 141 Beta 2 microglobulin 12/14/17 is 2.4  On review of systems, pt reports knee pain, night time LE skin soreness, peripheral neuropathy, eating well, strong appetite, intermittent sweating and denies fevers, chills, back pains, abdominal pains, and any other symptoms.   [name] DM type 2, HTN, CKD. On Social Hx the pt reports smoking a pack of cigarettes every 3 days.   Interval History:   [name] returns today regarding management and evaluation of his Multiple Myeloma. [name] was on 02/08/18, and saw my colleague Mikey Bussing, NP on 03/08/18. The pt reports that he is doing well overall.   The pt reports that he continues to tolerate Ninlaro without any difficulty. He notes that he hasn't had any fevers, chills, bone pains, or concerns  for infections.   The pt notes that his blood sugars have fluctuated some, and adds that he sweats more than he used to. He denies any leg swelling.   The pt notes that some of his finger tips began feeling intermittently mildly  numb a week ago, and notes that it is not very bothersome and it does not remain. He denies any pain with this neuropathy and denies any in his feet.   Lab results today (04/08/18) of CBC w/diff, CMP is as follows: all values are WNL except for RBC at 3.00, HGB at 10.0, HCT at 29.8, MCV at 99.2, Lymphs abs at 800, Eosinophils abs at 900, BUN at 26, Creatinine at 1.39, GFR at 58.  On review of systems, pt reports stable energy levels, occasional fingertip numbness, and denies fevers, chills, night sweats, new bone pains, painful neuropathy, neuropathy in his feet, concerns for infections, jaw pains, leg swelling, abdominal pains, and any other symptoms.   MEDICAL HISTORY:  Past Medical History:  Diagnosis Date  . Anemia   . Arthritis    shoulders,feet   . Cataract    per eye dr -has appt 5-11  . CKD (chronic kidney disease), stage III ([place])   . Elevated PSA   . History of ketoacidosis    03-29-2015   . History of sepsis    07-25-2013  non-compliant w/ medication  . Hyperlipidemia   . Hypertension   . Nocturia   . Peripheral neuropathy   . Prostate cancer ([place])   . Type 2 diabetes mellitus with insulin therapy ([employer])     SURGICAL HISTORY: Past Surgical History:  Procedure Laterality Date  . CIRCUMCISION  1990's  . PROSTATE BIOPSY N/A 12/21/2015   Procedure: BIOPSY TRANSRECTAL ULTRASONIC PROSTATE (TUBP);  Surgeon: Festus Aloe, MD;  Location: [facility];  Service: Urology;  Laterality: N/A;    SOCIAL HISTORY: Social History   Socioeconomic History  . Marital status: Single    Spouse name: Not on file  . Number of children: Not on file  . Years of education: Not on file  . Highest education level: Not on file  Occupational History  . Not on file  Social Needs  . Financial resource strain: Not on file  . Food insecurity:    Worry: Not on file    Inability: Not on file  . Transportation needs:    Medical: Not on file    Non-medical: Not on file    Tobacco Use  . Smoking status: Current Every Day Smoker    Packs/day: 0.25    Years: 50.00    Pack years: 12.50    Types: Cigarettes  . Smokeless tobacco: Never Used  . Tobacco comment: 1 ppwk-4-5 a day   Substance and Sexual Activity  . Alcohol use: Yes    Alcohol/week: 1.0 standard drinks    Types: 1 Cans of beer per week    Comment: 1 every day-quit couple weeks ago  . Drug use: No  . Sexual activity: Not Currently  Lifestyle  . Physical activity:    Days per week: Not on file    Minutes per session: Not on file  . Stress: Not on file  Relationships  . Social connections:    Talks on phone: Not on file    Gets together: Not on file    Attends religious service: Not on file    Active member of club or organization: Not on file    Attends meetings of clubs  or organizations: Not on file    Relationship status: Not on file  . Intimate partner violence:    Fear of current or ex partner: Not on file    Emotionally abused: Not on file    Physically abused: Not on file    Forced sexual activity: Not on file  Other Topics Concern  . Not on file  Social History Narrative  . Not on file    FAMILY HISTORY: Family History  Problem Relation Age of Onset  . Cancer Neg Hx   . Colon cancer Neg Hx   . Colon polyps Neg Hx   . Esophageal cancer Neg Hx   . Rectal cancer Neg Hx   . Stomach cancer Neg Hx     ALLERGIES:  has No Known Allergies.  MEDICATIONS:  Current Outpatient Medications  Medication Sig Dispense Refill  . ACCU-CHEK AVIVA PLUS test strip 1 each by Other route See admin instructions. 100 each 12  . acyclovir (ZOVIRAX) 400 MG tablet TAKE 1 TABLET BY MOUTH 2 TIMES DAILY. 60 tablet 3  . amLODipine (NORVASC) 5 MG tablet Take 1 tablet (5 mg total) by mouth daily. 90 tablet 3  . aspirin EC 81 MG tablet Take 1 tablet (81 mg total) by mouth daily. 90 tablet 3  . Cholecalciferol (VITAMIN D) 2000 units CAPS Take 2,000 Units by mouth daily.    . diclofenac sodium  (VOLTAREN) 1 % GEL Apply 2 g topically 4 (four) times daily. 100 g 2  . DULoxetine (CYMBALTA) 20 MG capsule Take 1 capsule (20 mg total) by mouth daily. 30 capsule 3  . gabapentin (NEURONTIN) 300 MG capsule TAKE 1 CAPSULE BY MOUTH IN THE MORNING AND AT LUNCH, TAKE 2 CAPSULES BY MOUTH AT NIGHT. 120 capsule 4  . glucose blood test strip See admin instructions.    . insulin aspart protamine - aspart (NOVOLOG 70/30 MIX) (70-30) 100 UNIT/ML FlexPen Inject 0.07 mLs (7 Units total) into the skin 2 (two) times daily with a meal. 15 mL 6  . Insulin Pen Needle (TRUEPLUS PEN NEEDLES) 32G X 4 MM MISC Use as directed twice daily with insulin 100 each 3  . ixazomib citrate (NINLARO) 3 MG capsule Take 1 capsule by mouth on Day 1, Day 8 & D15 of each 28d cycle. Take on empty stomach 1hr before or 2hrs after food. Do not crush,chew,open 3 capsule 1  . lidocaine-prilocaine (EMLA) cream Apply to affected area once 30 g 3  . mometasone (ELOCON) 0.1 % cream Apply 1 application topically daily. 45 g 3  . Multiple Vitamin (MULTIVITAMIN WITH MINERALS) TABS tablet Take 1 tablet by mouth daily. 60 tablet 2  . ondansetron (ZOFRAN) 8 MG tablet Take 1 tablet (8 mg total) by mouth 2 (two) times daily as needed (Nausea or vomiting). 30 tablet 1  . polyethylene glycol powder (GLYCOLAX/MIRALAX) powder Take 17 g by mouth as needed for moderate constipation. 3350 g 1  . pravastatin (PRAVACHOL) 20 MG tablet Take 1 tablet (20 mg total) by mouth daily. 90 tablet 3  . prochlorperazine (COMPAZINE) 10 MG tablet Take 1 tablet (10 mg total) by mouth every 6 (six) hours as needed (Nausea or vomiting). 30 tablet 1  . triamcinolone lotion (KENALOG) 0.1 % Apply 1 application topically 3 (three) times daily as needed. 60 mL 2   Current Facility-Administered Medications  Medication Dose Route Frequency Provider Last Rate Last Dose  . 0.9 %  sodium chloride infusion  500 mL Intravenous Continuous Pyrtle, Ulice Dash  M, MD        REVIEW OF SYSTEMS:     A 10+ POINT REVIEW OF SYSTEMS WAS OBTAINED including neurology, dermatology, psychiatry, cardiac, respiratory, lymph, extremities, GI, GU, Musculoskeletal, constitutional, breasts, reproductive, HEENT.  All pertinent positives are noted in the HPI.  All others are negative.   PHYSICAL EXAMINATION: ECOG PERFORMANCE STATUS: 1 - Symptomatic but completely ambulatory  GENERAL:alert, in no acute distress and comfortable SKIN: no acute rashes, no significant lesions EYES: conjunctiva are pink and non-injected, sclera anicteric OROPHARYNX: MMM, no exudates, no oropharyngeal erythema or ulceration NECK: supple, no JVD LYMPH:  no palpable lymphadenopathy in the cervical, axillary or inguinal regions LUNGS: clear to auscultation b/l with normal respiratory effort HEART: regular rate & rhythm ABDOMEN:  normoactive bowel sounds , non tender, not distended. No palpable hepatosplenomegaly.  Extremity: no pedal edema PSYCH: alert & oriented x 3 with fluent speech NEURO: no focal motor/sensory deficits   LABORATORY DATA:  I have reviewed the data as listed  . CBC Latest Ref Rng & Units 04/08/2018 03/08/2018 02/22/2018  WBC 4.0 - 10.3 K/uL 4.1 5.1 4.7  Hemoglobin 13.0 - 17.1 g/dL 10.0(L) 10.1(L) 10.6(L)  Hematocrit 38.4 - 49.9 % 29.8(L) 30.2(L) 31.8(L)  Platelets 140 - 400 K/uL 161 174 166    . CMP Latest Ref Rng & Units 04/08/2018 03/08/2018 02/22/2018  Glucose 70 - 99 mg/dL 88 72 84  BUN 8 - 23 mg/dL 26(H) 17 28(H)  Creatinine 0.61 - 1.24 mg/dL 1.39(H) 1.30(H) 1.50(H)  Sodium 135 - 145 mmol/L 139 140 141  Potassium 3.5 - 5.1 mmol/L 4.0 3.7 3.7  Chloride 98 - 111 mmol/L 107 106 105  CO2 22 - 32 mmol/L '25 25 27  ' Calcium 8.9 - 10.3 mg/dL 9.5 9.8 9.6  Total Protein 6.5 - 8.1 g/dL 6.8 6.3(L) 7.0  Total Bilirubin 0.3 - 1.2 mg/dL 0.5 0.3 0.5  Alkaline Phos 38 - 126 U/L 86 83 85  AST 15 - 41 U/L '24 18 19  ' ALT 0 - 44 U/L '15 16 17   ' 08/29/17 BM Bx:    08/29/17 Cytogenetics:   03/13/17  Cytogenetics:          RADIOGRAPHIC STUDIES: I have personally reviewed the radiological images as listed and agreed with the findings in the report. No results found.  ASSESSMENT & PLAN:   72 y.o. male with   1. IgA Lambda Multiple Myeloma ISS Stage I -Currently in remission  Active disease was previously diagnosed based on presence of anemia, kidney insufficiency, and paraproteinemia with significant predominance of lambda light chains as well as significant elevation of IgA.  PLAN:  -Recommend PCP rule out Peripheral arterial disease with [place] with ABIs given leg cramping -Discussed pt labwork today, 04/08/18; blood counts and chemistries are stable  -Discussed that the pt's last MMP from 03/08/18 did not reveal any abnormal protein and that he continues in remission  -Continue Acyclovir, will refill today  -The pt has no prohibitive toxicities from continuing Ninlaro at this time.   -Will give pt flu shot in clinic today -Recommended that the pt begin a Vitamin B complex -Discussed that the pt is fine from my perspective to pursue dental work, as he is not taking Xgeva -Will see the pt back in 4 weeks    FLu shot today RTC with dr Irene Limbo with labs in 4 weeks   All of the patients questions were answered with apparent satisfaction. The patient knows to call the clinic with any  problems, questions or concerns.  The total time spent in the appt was 25 minutes and more than 50% was on counseling and direct patient cares.      Sullivan Lone MD MS AAHIVMS [facility] Hematology/Oncology Physician [facility]  (Office):       [phone] (Work cell):  [phone] (Fax):           [phone]  04/08/2018 11:21 AM  I, Baldwin Jamaica, am acting as a scribe for Dr. Irene Limbo  .I have reviewed the above documentation for accuracy and completeness, and I agree with the above. Brunetta Genera MD

## 2018-04-08 ENCOUNTER — Inpatient Hospital Stay (HOSPITAL_BASED_OUTPATIENT_CLINIC_OR_DEPARTMENT_OTHER): Payer: Medicare HMO | Admitting: Hematology

## 2018-04-08 ENCOUNTER — Other Ambulatory Visit: Payer: Self-pay

## 2018-04-08 ENCOUNTER — Inpatient Hospital Stay: Payer: Medicare HMO | Attending: Hematology and Oncology

## 2018-04-08 ENCOUNTER — Encounter: Payer: Self-pay | Admitting: Hematology

## 2018-04-08 ENCOUNTER — Telehealth: Payer: Self-pay | Admitting: Hematology

## 2018-04-08 VITALS — BP 115/69 | HR 73 | Temp 98.1°F | Resp 18 | Ht 64.0 in | Wt 149.1 lb

## 2018-04-08 DIAGNOSIS — Z5111 Encounter for antineoplastic chemotherapy: Secondary | ICD-10-CM

## 2018-04-08 DIAGNOSIS — C9001 Multiple myeloma in remission: Secondary | ICD-10-CM | POA: Insufficient documentation

## 2018-04-08 DIAGNOSIS — C9000 Multiple myeloma not having achieved remission: Secondary | ICD-10-CM

## 2018-04-08 DIAGNOSIS — Z23 Encounter for immunization: Secondary | ICD-10-CM

## 2018-04-08 LAB — CMP (CANCER CENTER ONLY)
ALT: 15 U/L (ref 0–44)
ANION GAP: 7 (ref 5–15)
AST: 24 U/L (ref 15–41)
Albumin: 3.7 g/dL (ref 3.5–5.0)
Alkaline Phosphatase: 86 U/L (ref 38–126)
BUN: 26 mg/dL — ABNORMAL HIGH (ref 8–23)
CALCIUM: 9.5 mg/dL (ref 8.9–10.3)
CO2: 25 mmol/L (ref 22–32)
Chloride: 107 mmol/L (ref 98–111)
Creatinine: 1.39 mg/dL — ABNORMAL HIGH (ref 0.61–1.24)
GFR, EST AFRICAN AMERICAN: 58 mL/min — AB (ref >60)
GFR, Estimated: 50 mL/min — ABNORMAL LOW (ref >60)
Glucose, Bld: 88 mg/dL (ref 70–99)
Potassium: 4 mmol/L (ref 3.5–5.1)
SODIUM: 139 mmol/L (ref 135–145)
Total Bilirubin: 0.5 mg/dL (ref 0.3–1.2)
Total Protein: 6.8 g/dL (ref 6.5–8.1)

## 2018-04-08 LAB — CBC WITH DIFFERENTIAL (CANCER CENTER ONLY)
BASOS ABS: 0 10*3/uL (ref 0.0–0.1)
BASOS PCT: 1 %
Eosinophils Absolute: 0.9 10*3/uL — ABNORMAL HIGH (ref 0.0–0.5)
Eosinophils Relative: 21 %
HEMATOCRIT: 29.8 % — AB (ref 38.4–49.9)
HEMOGLOBIN: 10 g/dL — AB (ref 13.0–17.1)
Lymphocytes Relative: 19 %
Lymphs Abs: 0.8 10*3/uL — ABNORMAL LOW (ref 0.9–3.3)
MCH: 33.3 pg (ref 27.2–33.4)
MCHC: 33.6 g/dL (ref 32.0–36.0)
MCV: 99.2 fL — ABNORMAL HIGH (ref 79.3–98.0)
Monocytes Absolute: 0.4 10*3/uL (ref 0.1–0.9)
Monocytes Relative: 11 %
NEUTROS ABS: 2 10*3/uL (ref 1.5–6.5)
NEUTROS PCT: 48 %
Platelet Count: 161 10*3/uL (ref 140–400)
RBC: 3 MIL/uL — AB (ref 4.20–5.82)
RDW: 14.3 % (ref 11.0–14.6)
WBC: 4.1 10*3/uL (ref 4.0–10.3)

## 2018-04-08 MED ORDER — IXAZOMIB CITRATE 3 MG PO CAPS: ORAL_CAPSULE | ORAL | 5 refills | Status: DC

## 2018-04-08 MED ORDER — ACYCLOVIR 400 MG PO TABS: 400.0000 mg | ORAL_TABLET | Freq: Two times a day (BID) | ORAL | 5 refills | Status: DC

## 2018-04-08 MED ORDER — INFLUENZA VAC SPLIT HIGH-DOSE 0.5 ML IM SUSY
0.5000 mL | PREFILLED_SYRINGE | Freq: Once | INTRAMUSCULAR | Status: AC
  Administered 2018-04-08: 0.5 mL via INTRAMUSCULAR
  Filled 2018-04-08: qty 0.5

## 2018-04-08 MED ORDER — INFLUENZA VAC SPLIT QUAD 0.5 ML IM SUSY
PREFILLED_SYRINGE | INTRAMUSCULAR | Status: AC
  Filled 2018-04-08: qty 0.5

## 2018-04-08 MED ORDER — INFLUENZA VAC SPLIT QUAD 0.5 ML IM SUSY: 0.5000 mL | PREFILLED_SYRINGE | Freq: Once | INTRAMUSCULAR | Status: DC

## 2018-04-08 MED ORDER — INFLUENZA VAC SPLIT HIGH-DOSE 0.5 ML IM SUSY
0.5000 mL | PREFILLED_SYRINGE | INTRAMUSCULAR | Status: DC
  Filled 2018-04-08: qty 0.5

## 2018-04-08 NOTE — Telephone Encounter (Signed)
Gave pt avs and calendar  °

## 2018-04-08 NOTE — Patient Instructions (Signed)
Recommend a Vitamin B complex, once a day.

## 2018-04-09 ENCOUNTER — Ambulatory Visit: Payer: Medicare HMO | Admitting: Internal Medicine

## 2018-04-09 DIAGNOSIS — C61 Malignant neoplasm of prostate: Secondary | ICD-10-CM | POA: Diagnosis not present

## 2018-04-09 MED FILL — AMLODIPINE BESYLATE 5 MG TA: 5 | 30 days supply | Qty: 30 | Fill #0

## 2018-04-09 MED FILL — ACCU-CHEK AVIVA PLUS TEST S: 30 days supply | Qty: 100 | Fill #7

## 2018-04-09 MED FILL — DULoxetine HCL 20 MG CPEP: 20 | 30 days supply | Qty: 30 | Fill #3

## 2018-04-15 DIAGNOSIS — C61 Malignant neoplasm of prostate: Secondary | ICD-10-CM | POA: Diagnosis not present

## 2018-04-15 DIAGNOSIS — N5201 Erectile dysfunction due to arterial insufficiency: Secondary | ICD-10-CM | POA: Diagnosis not present

## 2018-04-23 MED FILL — GABAPENTIN 300 MG CAPSULE: 300 | 30 days supply | Qty: 120 | Fill #1

## 2018-05-02 ENCOUNTER — Emergency Department (HOSPITAL_COMMUNITY): Payer: Medicare HMO

## 2018-05-02 ENCOUNTER — Other Ambulatory Visit: Payer: Self-pay

## 2018-05-02 ENCOUNTER — Encounter (HOSPITAL_COMMUNITY): Payer: Self-pay | Admitting: Emergency Medicine

## 2018-05-02 ENCOUNTER — Observation Stay (HOSPITAL_COMMUNITY)
Admission: EM | Admit: 2018-05-02 | Discharge: 2018-05-04 | Disposition: A | Discharge status: Home / Self Care | Payer: Medicare HMO | Attending: Internal Medicine | Admitting: Internal Medicine

## 2018-05-02 DIAGNOSIS — N183 Chronic kidney disease, stage 3 (moderate): Secondary | ICD-10-CM | POA: Diagnosis not present

## 2018-05-02 DIAGNOSIS — R509 Fever, unspecified: Secondary | ICD-10-CM | POA: Diagnosis present

## 2018-05-02 DIAGNOSIS — Z923 Personal history of irradiation: Secondary | ICD-10-CM | POA: Insufficient documentation

## 2018-05-02 DIAGNOSIS — E1142 Type 2 diabetes mellitus with diabetic polyneuropathy: Secondary | ICD-10-CM | POA: Insufficient documentation

## 2018-05-02 DIAGNOSIS — Z794 Long term (current) use of insulin: Secondary | ICD-10-CM | POA: Insufficient documentation

## 2018-05-02 DIAGNOSIS — D61818 Other pancytopenia: Secondary | ICD-10-CM | POA: Insufficient documentation

## 2018-05-02 DIAGNOSIS — C9001 Multiple myeloma in remission: Secondary | ICD-10-CM | POA: Insufficient documentation

## 2018-05-02 DIAGNOSIS — D631 Anemia in chronic kidney disease: Secondary | ICD-10-CM | POA: Diagnosis not present

## 2018-05-02 DIAGNOSIS — I129 Hypertensive chronic kidney disease with stage 1 through stage 4 chronic kidney disease, or unspecified chronic kidney disease: Secondary | ICD-10-CM | POA: Diagnosis not present

## 2018-05-02 DIAGNOSIS — J189 Pneumonia, unspecified organism: Secondary | ICD-10-CM | POA: Diagnosis present

## 2018-05-02 DIAGNOSIS — Z7982 Long term (current) use of aspirin: Secondary | ICD-10-CM | POA: Diagnosis not present

## 2018-05-02 DIAGNOSIS — Z8546 Personal history of malignant neoplasm of prostate: Secondary | ICD-10-CM | POA: Insufficient documentation

## 2018-05-02 DIAGNOSIS — R05 Cough: Secondary | ICD-10-CM | POA: Diagnosis not present

## 2018-05-02 DIAGNOSIS — F17210 Nicotine dependence, cigarettes, uncomplicated: Secondary | ICD-10-CM | POA: Insufficient documentation

## 2018-05-02 DIAGNOSIS — E1122 Type 2 diabetes mellitus with diabetic chronic kidney disease: Secondary | ICD-10-CM | POA: Insufficient documentation

## 2018-05-02 DIAGNOSIS — E785 Hyperlipidemia, unspecified: Secondary | ICD-10-CM | POA: Insufficient documentation

## 2018-05-02 DIAGNOSIS — A419 Sepsis, unspecified organism: Principal | ICD-10-CM | POA: Insufficient documentation

## 2018-05-02 DIAGNOSIS — Z79899 Other long term (current) drug therapy: Secondary | ICD-10-CM | POA: Diagnosis not present

## 2018-05-02 DIAGNOSIS — R52 Pain, unspecified: Secondary | ICD-10-CM | POA: Insufficient documentation

## 2018-05-02 DIAGNOSIS — R262 Difficulty in walking, not elsewhere classified: Secondary | ICD-10-CM | POA: Diagnosis not present

## 2018-05-02 DIAGNOSIS — R059 Cough, unspecified: Secondary | ICD-10-CM | POA: Insufficient documentation

## 2018-05-02 LAB — COMPREHENSIVE METABOLIC PANEL
ALK PHOS: 50 U/L (ref 38–126)
ALT: 14 U/L (ref 0–44)
ANION GAP: 8 (ref 5–15)
AST: 20 U/L (ref 15–41)
Albumin: 3.7 g/dL (ref 3.5–5.0)
BILIRUBIN TOTAL: 0.5 mg/dL (ref 0.3–1.2)
BUN: 27 mg/dL — ABNORMAL HIGH (ref 8–23)
CALCIUM: 8.8 mg/dL — AB (ref 8.9–10.3)
CO2: 23 mmol/L (ref 22–32)
Chloride: 104 mmol/L (ref 98–111)
Creatinine, Ser: 1.44 mg/dL — ABNORMAL HIGH (ref 0.61–1.24)
GFR calc Af Amer: 55 mL/min — ABNORMAL LOW (ref >60)
GFR calc non Af Amer: 48 mL/min — ABNORMAL LOW (ref >60)
Glucose, Bld: 111 mg/dL — ABNORMAL HIGH (ref 70–99)
Potassium: 4.1 mmol/L (ref 3.5–5.1)
Sodium: 135 mmol/L (ref 135–145)
TOTAL PROTEIN: 6.5 g/dL (ref 6.5–8.1)

## 2018-05-02 LAB — URINALYSIS, ROUTINE W REFLEX MICROSCOPIC
Bacteria, UA: NONE SEEN
Bilirubin Urine: NEGATIVE
GLUCOSE, UA: NEGATIVE mg/dL
Ketones, ur: NEGATIVE mg/dL
LEUKOCYTES UA: NEGATIVE
Nitrite: NEGATIVE
PROTEIN: NEGATIVE mg/dL
SPECIFIC GRAVITY, URINE: 1.006 (ref 1.005–1.030)
pH: 6 (ref 5.0–8.0)

## 2018-05-02 LAB — CBC WITH DIFFERENTIAL/PLATELET
Abs Immature Granulocytes: 0.01 10*3/uL (ref 0.00–0.07)
BASOS ABS: 0 10*3/uL (ref 0.0–0.1)
BASOS PCT: 0 %
EOS ABS: 0.1 10*3/uL (ref 0.0–0.5)
EOS PCT: 3 %
HEMATOCRIT: 28.3 % — AB (ref 39.0–52.0)
Hemoglobin: 9.3 g/dL — ABNORMAL LOW (ref 13.0–17.0)
Immature Granulocytes: 0 %
Lymphocytes Relative: 13 %
Lymphs Abs: 0.5 10*3/uL — ABNORMAL LOW (ref 0.7–4.0)
MCH: 33.3 pg (ref 26.0–34.0)
MCHC: 32.9 g/dL (ref 30.0–36.0)
MCV: 101.4 fL — ABNORMAL HIGH (ref 80.0–100.0)
Monocytes Absolute: 0.5 10*3/uL (ref 0.1–1.0)
Monocytes Relative: 11 %
NRBC: 0 % (ref 0.0–0.2)
Neutro Abs: 3.1 10*3/uL (ref 1.7–7.7)
Neutrophils Relative %: 73 %
PLATELETS: 145 10*3/uL — AB (ref 150–400)
RBC: 2.79 MIL/uL — AB (ref 4.22–5.81)
RDW: 13.6 % (ref 11.5–15.5)
WBC: 4.2 10*3/uL (ref 4.0–10.5)

## 2018-05-02 LAB — I-STAT CG4 LACTIC ACID, ED
LACTIC ACID, VENOUS: 0.66 mmol/L (ref 0.5–1.9)
Lactic Acid, Venous: 0.62 mmol/L (ref 0.5–1.9)

## 2018-05-02 LAB — MRSA PCR SCREENING: MRSA by PCR: NEGATIVE

## 2018-05-02 LAB — PROTIME-INR
INR: 1.02
Prothrombin Time: 13.3 seconds (ref 11.4–15.2)

## 2018-05-02 LAB — GLUCOSE, CAPILLARY: Glucose-Capillary: 84 mg/dL (ref 70–99)

## 2018-05-02 MED ORDER — SODIUM CHLORIDE 0.9 % IV BOLUS (SEPSIS)
1000.0000 mL | Freq: Once | INTRAVENOUS | Status: AC
  Administered 2018-05-02: 1000 mL via INTRAVENOUS

## 2018-05-02 MED ORDER — ENOXAPARIN SODIUM 40 MG/0.4ML ~~LOC~~ SOLN
40.0000 mg | SUBCUTANEOUS | Status: DC
  Administered 2018-05-02 – 2018-05-03 (×2): 40 mg via SUBCUTANEOUS
  Filled 2018-05-02 (×2): qty 0.4

## 2018-05-02 MED ORDER — DULOXETINE HCL 20 MG PO CPEP
20.0000 mg | ORAL_CAPSULE | Freq: Every day | ORAL | Status: DC
  Administered 2018-05-02 – 2018-05-04 (×3): 20 mg via ORAL
  Filled 2018-05-02 (×3): qty 1

## 2018-05-02 MED ORDER — PHENOL 1.4 % MT LIQD
1.0000 | OROMUCOSAL | Status: DC | PRN
  Filled 2018-05-02: qty 177

## 2018-05-02 MED ORDER — VANCOMYCIN HCL 10 G IV SOLR
1500.0000 mg | Freq: Once | INTRAVENOUS | Status: AC
  Administered 2018-05-02: 1500 mg via INTRAVENOUS
  Filled 2018-05-02: qty 1500

## 2018-05-02 MED ORDER — VANCOMYCIN HCL 10 G IV SOLR: 1250.0000 mg | INTRAVENOUS | Status: DC

## 2018-05-02 MED ORDER — ASPIRIN EC 81 MG PO TBEC
81.0000 mg | DELAYED_RELEASE_TABLET | Freq: Every day | ORAL | Status: DC
  Administered 2018-05-02 – 2018-05-04 (×3): 81 mg via ORAL
  Filled 2018-05-02 (×3): qty 1

## 2018-05-02 MED ORDER — SODIUM CHLORIDE 0.45 % IV SOLN
INTRAVENOUS | Status: DC
  Administered 2018-05-02 – 2018-05-03 (×3): via INTRAVENOUS

## 2018-05-02 MED ORDER — SODIUM CHLORIDE 0.9 % IV SOLN
1.0000 g | Freq: Two times a day (BID) | INTRAVENOUS | Status: DC
  Administered 2018-05-02 – 2018-05-04 (×4): 1 g via INTRAVENOUS
  Filled 2018-05-02 (×4): qty 1

## 2018-05-02 MED ORDER — INSULIN ASPART 100 UNIT/ML ~~LOC~~ SOLN
0.0000 [IU] | Freq: Three times a day (TID) | SUBCUTANEOUS | Status: DC
  Administered 2018-05-03 (×2): 2 [IU] via SUBCUTANEOUS

## 2018-05-02 MED ORDER — ACETAMINOPHEN 325 MG PO TABS
650.0000 mg | ORAL_TABLET | Freq: Once | ORAL | Status: AC
  Administered 2018-05-02: 650 mg via ORAL
  Filled 2018-05-02: qty 2

## 2018-05-02 MED ORDER — AMLODIPINE BESYLATE 5 MG PO TABS
5.0000 mg | ORAL_TABLET | Freq: Every day | ORAL | Status: DC
  Administered 2018-05-02 – 2018-05-04 (×3): 5 mg via ORAL
  Filled 2018-05-02 (×3): qty 1

## 2018-05-02 MED ORDER — SODIUM CHLORIDE 0.9 % IV BOLUS (SEPSIS)
250.0000 mL | Freq: Once | INTRAVENOUS | Status: AC
  Administered 2018-05-02: 250 mL via INTRAVENOUS

## 2018-05-02 MED ORDER — GABAPENTIN 300 MG PO CAPS
300.0000 mg | ORAL_CAPSULE | Freq: Two times a day (BID) | ORAL | Status: DC
  Administered 2018-05-03 – 2018-05-04 (×3): 300 mg via ORAL
  Filled 2018-05-02 (×3): qty 1

## 2018-05-02 MED ORDER — SODIUM CHLORIDE 0.9 % IV SOLN
2.0000 g | Freq: Once | INTRAVENOUS | Status: AC
  Administered 2018-05-02: 2 g via INTRAVENOUS
  Filled 2018-05-02: qty 2

## 2018-05-02 MED ORDER — ACYCLOVIR 400 MG PO TABS
400.0000 mg | ORAL_TABLET | Freq: Two times a day (BID) | ORAL | Status: DC
  Administered 2018-05-02 – 2018-05-04 (×4): 400 mg via ORAL
  Filled 2018-05-02 (×5): qty 1

## 2018-05-02 MED ORDER — GABAPENTIN 300 MG PO CAPS
600.0000 mg | ORAL_CAPSULE | Freq: Every day | ORAL | Status: DC
  Administered 2018-05-02 – 2018-05-03 (×2): 600 mg via ORAL
  Filled 2018-05-02 (×3): qty 2

## 2018-05-02 NOTE — Progress Notes (Signed)
A consult was received from an ED physician for vancomycin and cefepime per pharmacy dosing.  The patient's profile has been reviewed for ht/wt/allergies/indication/available labs.    A one time order has been placed for vancomycin 1500 mg and cefepime 2 gm.    Further antibiotics/pharmacy consults should be ordered by admitting physician if indicated.                       Thank you, Eudelia Bunch, Pharm.D [phone] 05/02/2018 10:53 AM

## 2018-05-02 NOTE — Progress Notes (Signed)
Pharmacy Antibiotic Note  Robert Sparks is a 72 y.o. male admitted on 05/02/2018 with possible healthcare associated pneumonia.  Pharmacy has been consulted for Vancomycin and Cefepime dosing.  Plan: Vancomycin 1500mg  IV x 1 given in the ED. Continue with Vancomycin 1250mg  IV q36h.  Vancomycin levels at steady state, as indicated.  Cefepime 2g IV x 1 given in the ED. Continue with Cefepime 1g IV q12h. Monitor renal function, cultures, clinical course.   Height: 5\' 4"  (162.6 cm) Weight: 152 lb (68.9 kg) IBW/kg (Calculated) : 59.2  Temp (24hrs), Avg:99.7 F (37.6 C), Min:97.9 F (36.6 C), Max:101.4 F (38.6 C)  Recent Labs  Lab 05/02/18 1013 05/02/18 1040 05/02/18 1223  WBC 4.2  --   --   CREATININE 1.44*  --   --   LATICACIDVEN  --  0.66 0.62    Estimated Creatinine Clearance: 40 mL/min (A) (by C-G formula based on SCr of 1.44 mg/dL (H)).    No Known Allergies  Antimicrobials this admission: 10/24 Vancomycin >> 10/24 Cefepime >>  Dose adjustments this admission: --  Microbiology results: 10/24 BCx: sent 10/24 UCx: sent  10/24 MRSA PCR: ordered 10/24 HIV antibody: ordered  Thank you for allowing pharmacy to be a part of this patient's care.   Lindell Spar, PharmD, BCPS Pager: [phone] 05/02/2018 5:46 PM

## 2018-05-02 NOTE — ED Notes (Addendum)
Patient stated they are receiving chemotherapy for bone marrow cancer.

## 2018-05-02 NOTE — ED Notes (Signed)
Patient transported to X-ray 

## 2018-05-02 NOTE — H&P (Signed)
History and Physical    Robert Sparks SNK:[id number] DOB: Mar 07, 1946 DOA: 05/02/2018  PCP: [name], MD Patient coming from: Home    Chief Complaint: Generalized weakness and chills HPI: Robert Sparks is a 72 y.o. male with medical history significant of CKD stage III, prostate cancer, receives chemotherapy every Monday, multiple myeloma, hypertension, type 2 diabetes admitted with decreased appetite generalized weakness and chills.  In addition to this he complains of cough and fever subjective at home.  He denied any abdominal pain, nausea, vomiting or diarrhea.  No urinary complaints.  No dysuria hematuria.  Patient lives at home with a roommate. ED Course: Patient was found to be tachypneic.  Patient was given Vanco and cefepime in the ER for possible pneumonia.  Sodium 135 potassium 4.1 BUN 27 creatinine 1.44 which is slightly higher than his baseline lactic acid 0.62 white count 4.2 hemoglobin 9.3 platelet count 145. Review of Systems: See HPI significant for generalized weakness and chills  Past Medical History:  Diagnosis Date  . Anemia   . Arthritis    shoulders,feet   . Cataract    per eye dr -has appt 5-11  . CKD (chronic kidney disease), stage III ([place])   . Elevated PSA   . History of ketoacidosis    03-29-2015   . History of sepsis    07-25-2013  non-compliant w/ medication  . Hyperlipidemia   . Hypertension   . Nocturia   . Peripheral neuropathy   . Prostate cancer ([place])   . Type 2 diabetes mellitus with insulin therapy [facility])     Past Surgical History:  Procedure Laterality Date  . CIRCUMCISION  1990's  . PROSTATE BIOPSY N/A 12/21/2015   Procedure: BIOPSY TRANSRECTAL ULTRASONIC PROSTATE (TUBP);  Surgeon: Festus Aloe, MD;  Location: [facility];  Service: Urology;  Laterality: N/A;     reports that he has been smoking cigarettes. He has a 12.50 pack-year smoking history. He has never used smokeless tobacco. He reports that he  drinks about 1.0 standard drinks of alcohol per week. He reports that he does not use drugs.  No Known Allergies  Family History  Problem Relation Age of Onset  . Cancer Neg Hx   . Colon cancer Neg Hx   . Colon polyps Neg Hx   . Esophageal cancer Neg Hx   . Rectal cancer Neg Hx   . Stomach cancer Neg Hx      Prior to Admission medications   Medication Sig Start Date End Date Taking? Authorizing Provider  acyclovir (ZOVIRAX) 400 MG tablet Take 1 tablet (400 mg total) by mouth 2 (two) times daily. 04/08/18  Yes Brunetta Genera, MD  amLODipine (NORVASC) 5 MG tablet Take 1 tablet (5 mg total) by mouth daily. 12/05/17  Yes [name], MD  aspirin EC 81 MG tablet Take 1 tablet (81 mg total) by mouth daily. 10/02/16  Yes Langeland, Dawn T, MD  Cholecalciferol (VITAMIN D) 2000 units CAPS Take 2,000 Units by mouth daily.   Yes [provider]  diclofenac sodium (VOLTAREN) 1 % GEL Apply 2 g topically 4 (four) times daily. Patient taking differently: Apply 2 g topically 4 (four) times daily as needed (pain).  12/06/17  Yes [name], MD  DULoxetine (CYMBALTA) 20 MG capsule Take 1 capsule (20 mg total) by mouth daily. 01/07/18  Yes [name], MD  ferrous sulfate 325 (65 FE) MG tablet Take 325 mg by mouth daily.  Yes [provider]  gabapentin (NEURONTIN) 300 MG capsule TAKE 1 CAPSULE BY MOUTH IN THE MORNING AND AT LUNCH, TAKE 2 CAPSULES BY MOUTH AT NIGHT. Patient taking differently: Take 300-600 mg by mouth See admin instructions. Take 300 mg by mouth in the morning, 300 mg by mouth at lunch and 600 mg by mouth at bedtime 03/12/18  Yes [name], MD  insulin aspart protamine - aspart (NOVOLOG 70/30 MIX) (70-30) 100 UNIT/ML FlexPen Inject 0.07 mLs (7 Units total) into the skin 2 (two) times daily with a meal. 03/21/18  Yes [name], MD  ixazomib citrate (NINLARO) 3 MG capsule Take 1 capsule by mouth on Day 1, Day 8 & D15 of each 28d cycle.  Take on empty stomach 1hr before or 2hrs after food. Do not crush,chew,open Patient taking differently: Take 3 mg by mouth See admin instructions. Take 3 mg by mouth on Day 1, Day 8 & Day 15 of each 28d cycle. Take on empty stomach 1hr before or 2hrs after food. Do not crush, chew or open 04/08/18  Yes Brunetta Genera, MD  Multiple Vitamin (MULTIVITAMIN WITH MINERALS) TABS tablet Take 1 tablet by mouth daily. 11/12/16  Yes Lorella Nimrod, MD  pravastatin (PRAVACHOL) 20 MG tablet Take 1 tablet (20 mg total) by mouth daily. 12/05/17  Yes [name], MD  tadalafil (ADCIRCA/CIALIS) 20 MG tablet Take 20 mg by mouth daily as needed for erectile dysfunction.  04/16/18  Yes [provider]  triamcinolone lotion (KENALOG) 0.1 % Apply 1 application topically 3 (three) times daily as needed. Patient taking differently: Apply 1 application topically 3 (three) times daily as needed (irritation).  03/08/18  Yes Curcio, Roselie Awkward, NP  vitamin B-12 (CYANOCOBALAMIN) 100 MCG tablet Take 100 mcg by mouth daily.   Yes [provider]  ACCU-CHEK AVIVA PLUS test strip 1 each by Other route See admin instructions. 08/24/17   [name], MD  Insulin Pen Needle (TRUEPLUS PEN NEEDLES) 32G X 4 MM MISC Use as directed twice daily with insulin 01/30/18   [name], MD  lidocaine-prilocaine (EMLA) cream Apply to affected area once Patient not taking: Reported on 05/02/2018 03/08/17   [name], MD  mometasone (ELOCON) 0.1 % cream Apply 1 application topically daily. Patient not taking: Reported on 05/02/2018 03/30/17   [name], MD  ondansetron (ZOFRAN) 8 MG tablet Take 1 tablet (8 mg total) by mouth 2 (two) times daily as needed (Nausea or vomiting). Patient not taking: Reported on 05/02/2018 03/08/17   [name], MD  polyethylene glycol powder (GLYCOLAX/MIRALAX) powder Take 17 g by mouth as needed for moderate constipation. Patient not taking: Reported on 05/02/2018  03/21/18   [name], MD  prochlorperazine (COMPAZINE) 10 MG tablet Take 1 tablet (10 mg total) by mouth every 6 (six) hours as needed (Nausea or vomiting). Patient not taking: Reported on 05/02/2018 03/08/17   [name], MD    Physical Exam: Vitals:   05/02/18 1530 05/02/18 1557 05/02/18 1600 05/02/18 1638  BP: (!) 142/78 (!) 142/78 (!) 147/71 118/77  Pulse: 66 68 64 66  Resp: (!) _0 Temp:      TempSrc:      SpO2: 98% 97% 97% 97%  Weight:      Height:        Constitutional: NAD, calm, comfortable Vitals:   05/02/18 1530 05/02/18 1557 05/02/18 1600 05/02/18 1638  BP: (!) 142/78 [name])  142/78 (!) 147/71 118/77  Pulse: 66 68 64 66  Resp: (!) _0 Temp:      TempSrc:      SpO2: 98% 97% 97% 97%  Weight:      Height:       Eyes: PERRL, lids and conjunctivae normal ENMT: Mucous membranes are dry. Posterior pharynx clear of any exudate or lesions.Normal dentition.  Neck: normal, supple, no masses, no thyromegaly Respiratory: Few scattered rhonchi auscultation bilaterally, no wheezing, no crackles. Normal respiratory effort. No accessory muscle use.  Cardiovascular: Regular rate and rhythm, no murmurs / rubs / gallops. No extremity edema. 2+ pedal pulses. No carotid bruits.  Abdomen: no tenderness, no masses palpated. No hepatosplenomegaly. Bowel sounds positive.  Musculoskeletal: no clubbing / cyanosis. No joint deformity upper and lower extremities. Good ROM, no contractures. Normal muscle tone.  Skin: no rashes, lesions, ulcers. No induration Neurologic: CN 2-12 grossly intact. Sensation intact, DTR normal. Strength 5/5 in all 4.  Psychiatric: Normal judgment and insight. Alert and oriented x 3. Normal mood.   Labs on Admission: I have personally reviewed following labs and imaging studies  CBC: Recent Labs  Lab 05/02/18 1013  WBC 4.2  NEUTROABS 3.1  HGB 9.3*  HCT 28.3*  MCV 101.4*  PLT 124*   Basic Metabolic Panel: Recent Labs  Lab  05/02/18 1013  NA 135  K 4.1  CL 104  CO2 23  GLUCOSE 111*  BUN 27*  CREATININE 1.44*  CALCIUM 8.8*   GFR: Estimated Creatinine Clearance: 40 mL/min (A) (by C-G formula based on SCr of 1.44 mg/dL (H)). Liver Function Tests: Recent Labs  Lab 05/02/18 1013  AST 20  ALT 14  ALKPHOS 50  BILITOT 0.5  PROT 6.5  ALBUMIN 3.7   No results for input(s): LIPASE, AMYLASE in the last 168 hours. No results for input(s): AMMONIA in the last 168 hours. Coagulation Profile: Recent Labs  Lab 05/02/18 1013  INR 1.02   Cardiac Enzymes: No results for input(s): CKTOTAL, CKMB, CKMBINDEX, TROPONINI in the last 168 hours. BNP (last 3 results) No results for input(s): PROBNP in the last 8760 hours. HbA1C: No results for input(s): HGBA1C in the last 72 hours. CBG: No results for input(s): GLUCAP in the last 168 hours. Lipid Profile: No results for input(s): CHOL, HDL, LDLCALC, TRIG, CHOLHDL, LDLDIRECT in the last 72 hours. Thyroid Function Tests: No results for input(s): TSH, T4TOTAL, FREET4, T3FREE, THYROIDAB in the last 72 hours. Anemia Panel: No results for input(s): VITAMINB12, FOLATE, FERRITIN, TIBC, IRON, RETICCTPCT in the last 72 hours. Urine analysis:    Component Value Date/Time   COLORURINE STRAW (A) 05/02/2018 1434   APPEARANCEUR CLEAR 05/02/2018 1434   LABSPEC 1.006 05/02/2018 1434   PHURINE 6.0 05/02/2018 1434   GLUCOSEU NEGATIVE 05/02/2018 1434   HGBUR SMALL (A) 05/02/2018 1434   BILIRUBINUR NEGATIVE 05/02/2018 1434   [place] 05/02/2018 1434   PROTEINUR NEGATIVE 05/02/2018 1434   UROBILINOGEN 1.0 03/29/2015 1941   NITRITE NEGATIVE 05/02/2018 1434   LEUKOCYTESUR NEGATIVE 05/02/2018 1434    Radiological Exams on Admission: Dg Chest 2 View  Result Date: 05/02/2018 CLINICAL DATA:  Cough for 1 week EXAM: CHEST - 2 VIEW COMPARISON:  None. FINDINGS: The heart size and mediastinal contours are within normal limits. Both lungs are clear. The visualized  skeletal structures are unremarkable. IMPRESSION: No active cardiopulmonary disease. Electronically Signed   By: Inez Catalina M.D.   On: 05/02/2018 12:11    Assessment/Plan Active  Problems:   * No active hospital problems. * #1?  Healthcare associated pneumonia patient with history of malignancy multiple myeloma and chemotherapy we will empirically treat him with vancomycin and cefepime for now.  Chest x-ray is clear but patient appears very dry I will hydrate him consider repeating chest x-ray tomorrow.  Follow blood culture.  2 history of multiple myeloma and prostate cancer followed by Dr.kale.  I have not informed him of the admission.  #3 hypertension restart Norvasc.  #4 type 2 diabetes patient takes 70/30 NovoLog Mix at home I will put him on SSI.  DVT prophylaxis: Lovenox Code Status: Full code Family Communication: None  Disposition Plan: Pending clinical improvement  Consults called: None Admission status: Observation  Georgette Shell MD [facility] If 7PM-7AM, please contact night-coverage [email or web address] Password [email or web address]  05/02/2018, 5:06 PM

## 2018-05-02 NOTE — ED Provider Notes (Addendum)
[facility] DEPT Provider Note   CSN: [id number] Arrival date & time: 05/02/18  0950     History   Chief Complaint Chief Complaint  Patient presents with  . Fever    HPI Robert Sparks is a 72 y.o. male.  HPI   He presents for evaluation of general achiness and cough, nonproductive for several hours.  He came by EMS.  He receives chemotherapy every Monday.  He states he is being treated for bone marrow cancer.  Denies known fever, chills, nausea, vomiting, dysuria, urinary frequency, constipation or diarrhea.  There are no other known modifying factors.  Past Medical History:  Diagnosis Date  . Anemia   . Arthritis    shoulders,feet   . Cataract    per eye dr -has appt 5-11  . CKD (chronic kidney disease), stage III ([place])   . Elevated PSA   . History of ketoacidosis    03-29-2015   . History of sepsis    07-25-2013  non-compliant w/ medication  . Hyperlipidemia   . Hypertension   . Nocturia   . Peripheral neuropathy   . Prostate cancer ([place])   . Type 2 diabetes mellitus with insulin therapy [facility])     Patient Active Problem List   Diagnosis Date Noted  . Primary osteoarthritis of right knee 01/07/2018  . Diabetic polyneuropathy associated with type 2 diabetes mellitus ([place]) 01/07/2018  . Vitamin B12 deficiency 09/04/2017  . Pre-ulcerative corn or callous 04/12/2017  . Cataract of both eyes 04/12/2017  . Tobacco dependence 04/12/2017  . Hypokalemia 03/23/2017  . Multiple myeloma in remission ([place]) 02/23/2017  . Oral leukoplakia 02/09/2017  . Lightheadedness 11/10/2016  . Tobacco abuse 06/21/2016  . Hyperlipidemia 06/21/2016  . CKD (chronic kidney disease) stage 3, GFR 30-59 ml/min ([facility]) 02/23/2016  . Malignant neoplasm of prostate ([place]) 02/21/2016  . Controlled type 2 diabetes mellitus with stage 3 chronic kidney disease, with long-term current use of insulin ([place]) 03/29/2015  . HTN (hypertension) 07/28/2013  . Anemia,  chronic disease 07/26/2013  . ETOH abuse 07/25/2013    Past Surgical History:  Procedure Laterality Date  . CIRCUMCISION  1990's  . PROSTATE BIOPSY N/A 12/21/2015   Procedure: BIOPSY TRANSRECTAL ULTRASONIC PROSTATE (TUBP);  Surgeon: Festus Aloe, MD;  Location: [facility];  Service: Urology;  Laterality: N/A;        Home Medications    Prior to Admission medications   Medication Sig Start Date End Date Taking? Authorizing Provider  acyclovir (ZOVIRAX) 400 MG tablet Take 1 tablet (400 mg total) by mouth 2 (two) times daily. 04/08/18  Yes Brunetta Genera, MD  amLODipine (NORVASC) 5 MG tablet Take 1 tablet (5 mg total) by mouth daily. 12/05/17  Yes [name], MD  aspirin EC 81 MG tablet Take 1 tablet (81 mg total) by mouth daily. 10/02/16  Yes Langeland, Dawn T, MD  Cholecalciferol (VITAMIN D) 2000 units CAPS Take 2,000 Units by mouth daily.   Yes [provider]  diclofenac sodium (VOLTAREN) 1 % GEL Apply 2 g topically 4 (four) times daily. Patient taking differently: Apply 2 g topically 4 (four) times daily as needed (pain).  12/06/17  Yes [name], MD  DULoxetine (CYMBALTA) 20 MG capsule Take 1 capsule (20 mg total) by mouth daily. 01/07/18  Yes [name], MD  ferrous sulfate 325 (65 FE) MG tablet Take 325 mg by mouth daily.   Yes [provider]  gabapentin (NEURONTIN)  300 MG capsule TAKE 1 CAPSULE BY MOUTH IN THE MORNING AND AT LUNCH, TAKE 2 CAPSULES BY MOUTH AT NIGHT. Patient taking differently: Take 300-600 mg by mouth See admin instructions. Take 300 mg by mouth in the morning, 300 mg by mouth at lunch and 600 mg by mouth at bedtime 03/12/18  Yes [name], MD  insulin aspart protamine - aspart (NOVOLOG 70/30 MIX) (70-30) 100 UNIT/ML FlexPen Inject 0.07 mLs (7 Units total) into the skin 2 (two) times daily with a meal. 03/21/18  Yes [name], MD  ixazomib citrate (NINLARO) 3 MG capsule Take 1 capsule by  mouth on Day 1, Day 8 & D15 of each 28d cycle. Take on empty stomach 1hr before or 2hrs after food. Do not crush,chew,open Patient taking differently: Take 3 mg by mouth See admin instructions. Take 3 mg by mouth on Day 1, Day 8 & Day 15 of each 28d cycle. Take on empty stomach 1hr before or 2hrs after food. Do not crush, chew or open 04/08/18  Yes Brunetta Genera, MD  Multiple Vitamin (MULTIVITAMIN WITH MINERALS) TABS tablet Take 1 tablet by mouth daily. 11/12/16  Yes Lorella Nimrod, MD  pravastatin (PRAVACHOL) 20 MG tablet Take 1 tablet (20 mg total) by mouth daily. 12/05/17  Yes [name], MD  tadalafil (ADCIRCA/CIALIS) 20 MG tablet Take 20 mg by mouth daily as needed for erectile dysfunction.  04/16/18  Yes [provider]  triamcinolone lotion (KENALOG) 0.1 % Apply 1 application topically 3 (three) times daily as needed. Patient taking differently: Apply 1 application topically 3 (three) times daily as needed (irritation).  03/08/18  Yes Curcio, Roselie Awkward, NP  vitamin B-12 (CYANOCOBALAMIN) 100 MCG tablet Take 100 mcg by mouth daily.   Yes [provider]  ACCU-CHEK AVIVA PLUS test strip 1 each by Other route See admin instructions. 08/24/17   [name], MD  Insulin Pen Needle (TRUEPLUS PEN NEEDLES) 32G X 4 MM MISC Use as directed twice daily with insulin 01/30/18   [name], MD  lidocaine-prilocaine (EMLA) cream Apply to affected area once Patient not taking: Reported on 05/02/2018 03/08/17   [name], MD  mometasone (ELOCON) 0.1 % cream Apply 1 application topically daily. Patient not taking: Reported on 05/02/2018 03/30/17   [name], MD  ondansetron (ZOFRAN) 8 MG tablet Take 1 tablet (8 mg total) by mouth 2 (two) times daily as needed (Nausea or vomiting). Patient not taking: Reported on 05/02/2018 03/08/17   [name], MD  polyethylene glycol powder (GLYCOLAX/MIRALAX) powder Take 17 g by mouth as needed for moderate  constipation. Patient not taking: Reported on 05/02/2018 03/21/18   [name], MD  prochlorperazine (COMPAZINE) 10 MG tablet Take 1 tablet (10 mg total) by mouth every 6 (six) hours as needed (Nausea or vomiting). Patient not taking: Reported on 05/02/2018 03/08/17   [name], MD    Family History Family History  Problem Relation Age of Onset  . Cancer Neg Hx   . Colon cancer Neg Hx   . Colon polyps Neg Hx   . Esophageal cancer Neg Hx   . Rectal cancer Neg Hx   . Stomach cancer Neg Hx     Social History Social History   Tobacco Use  . Smoking status: Current Every Day Smoker    Packs/day: 0.25    Years: 50.00    Pack years: 12.50    Types: Cigarettes  . Smokeless tobacco:  Never Used  . Tobacco comment: 1 ppwk-4-5 a day   Substance Use Topics  . Alcohol use: Yes    Alcohol/week: 1.0 standard drinks    Types: 1 Cans of beer per week    Comment: 1 every day-quit couple weeks ago  . Drug use: No     Allergies   Patient has no known allergies.   Review of Systems Review of Systems  All other systems reviewed and are negative.    Physical Exam Updated Vital Signs BP 139/76   Pulse 71   Temp 97.9 F (36.6 C) (Oral)   Resp (!) 25   Ht _0  (1.626 m)   Wt 68.9 kg   SpO2 97%   BMI 26.09 kg/m   Physical Exam  Constitutional: He is oriented to person, place, and time. He appears well-developed. No distress.  Elderly, frail  HENT:  Head: Normocephalic and atraumatic.  Right Ear: External ear normal.  Left Ear: External ear normal.  Poor dentition with gingivitis.  No trismus.  No visible intraoral abscess.  Eyes: Pupils are equal, round, and reactive to light. Conjunctivae and EOM are normal.  Neck: Normal range of motion and phonation normal. Neck supple.  Cardiovascular: Normal rate, regular rhythm and normal heart sounds.  Pulmonary/Chest: Effort normal and breath sounds normal. He exhibits no bony tenderness.  Abdominal: Soft. There is  no tenderness.  Musculoskeletal: Normal range of motion.  Neurological: He is alert and oriented to person, place, and time. No cranial nerve deficit or sensory deficit. He exhibits normal muscle tone. Coordination normal.  Skin: Skin is warm, dry and intact.  Psychiatric: He has a normal mood and affect. His behavior is normal. Judgment and thought content normal.  Nursing note and vitals reviewed.    ED Treatments / Results  Labs (all labs ordered are listed, but only abnormal results are displayed) Labs Reviewed  COMPREHENSIVE METABOLIC PANEL - Abnormal; Notable for the following components:      Result Value   Glucose, Bld 111 (*)    BUN 27 (*)    Creatinine, Ser 1.44 (*)    Calcium 8.8 (*)    GFR calc non Af Amer 48 (*)    GFR calc Af Amer 55 (*)    All other components within normal limits  CBC WITH DIFFERENTIAL/PLATELET - Abnormal; Notable for the following components:   RBC 2.79 (*)    Hemoglobin 9.3 (*)    HCT 28.3 (*)    MCV 101.4 (*)    Platelets 145 (*)    Lymphs Abs 0.5 (*)    All other components within normal limits  CULTURE, BLOOD (ROUTINE X 2)  CULTURE, BLOOD (ROUTINE X 2)  URINE CULTURE  PROTIME-INR  URINALYSIS, ROUTINE W REFLEX MICROSCOPIC  I-STAT CG4 LACTIC ACID, ED  I-STAT CG4 LACTIC ACID, ED    EKG None  Radiology Dg Chest 2 View  Result Date: 05/02/2018 CLINICAL DATA:  Cough for 1 week EXAM: CHEST - 2 VIEW COMPARISON:  None. FINDINGS: The heart size and mediastinal contours are within normal limits. Both lungs are clear. The visualized skeletal structures are unremarkable. IMPRESSION: No active cardiopulmonary disease. Electronically Signed   By: Inez Catalina M.D.   On: 05/02/2018 12:11    Procedures .Critical Care Performed by: Daleen Bo, MD Authorized by: Daleen Bo, MD   Critical care provider statement:    Critical care time (minutes):  45   Critical care start time:  05/02/2018 10:25 AM  Critical care end time:  05/02/2018  3:30 PM   Critical care time was exclusive of:  Separately billable procedures and treating other patients   Critical care was necessary to treat or prevent imminent or life-threatening deterioration of the following conditions:  Sepsis   Critical care was time spent personally by me on the following activities:  Blood draw for specimens, development of treatment plan with patient or surrogate, discussions with consultants, evaluation of patient's response to treatment, examination of patient, obtaining history from patient or surrogate, ordering and performing treatments and interventions, ordering and review of laboratory studies, pulse oximetry, re-evaluation of patient's condition, review of old charts and ordering and review of radiographic studies   (including critical care time)  Medications Ordered in ED Medications  sodium chloride 0.9 % bolus 1,000 mL (0 mLs Intravenous Stopped 05/02/18 1222)    And  sodium chloride 0.9 % bolus 1,000 mL (0 mLs Intravenous Stopped 05/02/18 1442)    And  sodium chloride 0.9 % bolus 250 mL (0 mLs Intravenous Stopped 05/02/18 1442)  acetaminophen (TYLENOL) tablet 650 mg (650 mg Oral Given 05/02/18 1111)  vancomycin (VANCOCIN) 1,500 mg in sodium chloride 0.9 % 500 mL IVPB (1,500 mg Intravenous New Bag/Given 05/02/18 1216)  ceFEPIme (MAXIPIME) 2 g in sodium chloride 0.9 % 100 mL IVPB (0 g Intravenous Stopped 05/02/18 1442)     Initial Impression / Assessment and Plan / ED Course  I have reviewed the triage vital signs and the nursing notes.  Pertinent labs & imaging results that were available during my care of the patient were reviewed by me and considered in my medical decision making (see chart for details).  Clinical Course as of May 02 1506  Thu May 02, 2018  1453 Normal  Protime-INR [EW]  1453 Normal except hemoglobin low, MCV high, platelets low  CBC with Differential(!) [EW]  1454 Normal  I-Stat CG4 Lactic Acid, ED [EW]  1454 Normal  except glucose high, BUN high, creatinine high, calcium low GFR low  Comprehensive metabolic panel(!) [EW]    Clinical Course User Index [EW] Daleen Bo, MD     Patient Vitals for the past 24 hrs:  BP Temp Temp src Pulse Resp SpO2 Height Weight  05/02/18 1445 - 97.9 F (36.6 C) Oral - - - - -  05/02/18 1400 139/76 - - 71 (!) 25 97 % - -  05/02/18 1346 (!) 142/79 - - 70 (!) 22 98 % - -  05/02/18 1330 (!) 142/79 - - 73 (!) 26 99 % - -  05/02/18 1307 125/72 - - 72 (!) 21 98 % - -  05/02/18 1300 125/72 - - 76 14 100 % - -  05/02/18 1214 136/72 - - 76 12 98 % - -  05/02/18 1200 136/72 - - 74 (!) 21 97 % - -  05/02/18 1130 (!) 143/76 - - 77 (!) 28 99 % - -  05/02/18 1110 130/78 - - 80 (!) 23 98 % - -  05/02/18 1030 124/71 - - 79 19 97 % - -  05/02/18 1009 - - - - - - _0  (1.626 m) 68.9 kg  05/02/18 1008 115/72 (!) 101.4 F (38.6 C) Rectal 80 (!) 22 97 % - -    3:07 PM Reevaluation with update and discussion. After initial assessment and treatment, an updated evaluation reveals he is comfortable has no further complaints, findings discussed and questions answered. Daleen Bo   Medical Decision Making: Fever with cough,  onset today with tachypnea and temperature 101.4.  Labs are reassuring with normal white count, and normal lactate.  Patient with sepsis, without findings for severe sepsis.  Patient has anemia which is stable.  CRITICAL CARE-yes Performed by: Daleen Bo  Nursing Notes Reviewed/ Care Coordinated Applicable Imaging Reviewed Interpretation of Laboratory Data incorporated into ED treatment   3:28 PM-Consult complete with Hospitalist. Patient case explained and discussed. She agrees to admit patient for further evaluation and treatment. Call ended at 4:26 PM  Plan: Admit  Final Clinical Impressions(s) / ED Diagnoses   Final diagnoses:  Sepsis, due to unspecified organism, unspecified whether acute organ dysfunction present [facility])     ED Discharge  Orders    None       Daleen Bo, MD 05/02/18 Marlana Latus    Daleen Bo, MD 05/24/18 [phone]

## 2018-05-02 NOTE — ED Triage Notes (Signed)
Patient arrived by EMS from home. Pt c/o pain in his right hand and had trouble walking around with morning per EMS. Pt has had cough for the past week per EMS. EMS VS T 100.8 F, BP 130/60, HR 90, RR 18, CBG 127, SpO2 98% on RA.   Hx of bone marrow cancer.   Denies N/V/D, has had constipation for the past two days per EMS.

## 2018-05-02 NOTE — ED Notes (Signed)
Bed: WA21 Expected date:  Expected time:  Means of arrival:  Comments: EMS-fever

## 2018-05-02 NOTE — ED Notes (Signed)
ED TO INPATIENT HANDOFF REPORT  Name/Age/Gender Robert Sparks 72 y.o. male  Code Status    Code Status Orders  (From admission, onward)         Start     Ordered   05/02/18 1718  Full code  Continuous     05/02/18 1718        Code Status History    Date Active Date Inactive Code Status Order ID Comments User Context   11/10/2016 1611 11/11/2016 2023 Full Code [id number]  Shela Leff, MD Inpatient   03/29/2015 2130 04/01/2015 1936 Full Code [id number]  Olin Hauser, DO ED   07/25/2013 1718 07/29/2013 1951 Full Code [id number]  Eugenie Filler, MD Inpatient      Home/SNF/Other Home  Chief Complaint fever   Level of Care/Admitting Diagnosis ED Disposition    ED Disposition Condition [facility] Area: [facility] [[id number]]  Level of Care: Med-Surg [16]  Diagnosis: HCAP (healthcare-associated pneumonia) [828003]  Admitting Physician: Georgette Shell [[id number]]  Attending Physician: Georgette Shell [phone]  PT Class (Do Not Modify): Observation [104]  PT Acc Code (Do Not Modify): Observation [10022]       Medical History Past Medical History:  Diagnosis Date  . Anemia   . Arthritis    shoulders,feet   . Cataract    per eye dr -has appt 5-11  . CKD (chronic kidney disease), stage III ([place])   . Elevated PSA   . History of ketoacidosis    03-29-2015   . History of sepsis    07-25-2013  non-compliant w/ medication  . Hyperlipidemia   . Hypertension   . Nocturia   . Peripheral neuropathy   . Prostate cancer ([place])   . Type 2 diabetes mellitus with insulin therapy ([place])     Allergies No Known Allergies  IV Location/Drains/Wounds Patient Lines/Drains/Airways Status   Active Line/Drains/Airways    Name:   Placement date:   Placement time:   Site:   Days:   Peripheral IV 05/02/18 Right Antecubital   05/02/18    1051    Antecubital   less than 1   Incision (Closed) 12/21/15 Rectum   12/21/15    0905      863   Incision (Closed) 06/08/17 Back Right;Lower   06/08/17    0948     328          Labs/Imaging Results for orders placed or performed during the hospital encounter of 05/02/18 (from the past 48 hour(s))  Comprehensive metabolic panel     Status: Abnormal   Collection Time: 05/02/18 10:13 AM  Result Value Ref Range   Sodium 135 135 - 145 mmol/L   Potassium 4.1 3.5 - 5.1 mmol/L   Chloride 104 98 - 111 mmol/L   CO2 23 22 - 32 mmol/L   Glucose, Bld 111 (H) 70 - 99 mg/dL   BUN 27 (H) 8 - 23 mg/dL   Creatinine, Ser 1.44 (H) 0.61 - 1.24 mg/dL   Calcium 8.8 (L) 8.9 - 10.3 mg/dL   Total Protein 6.5 6.5 - 8.1 g/dL   Albumin 3.7 3.5 - 5.0 g/dL   AST 20 15 - 41 U/L   ALT 14 0 - 44 U/L   Alkaline Phosphatase 50 38 - 126 U/L   Total Bilirubin 0.5 0.3 - 1.2 mg/dL   GFR calc non Af Amer 48 (L) >60 mL/min   GFR calc Af Amer 55 (L) >60 mL/min  Comment: (NOTE) The eGFR has been calculated using the CKD EPI equation. This calculation has not been validated in all clinical situations. eGFR's persistently <60 mL/min signify possible Chronic Kidney Disease.    Anion gap 8 5 - 15    Comment: Performed at [facility], [place] [address] [place], [place] [address]  CBC with Differential     Status: Abnormal   Collection Time: 05/02/18 10:13 AM  Result Value Ref Range   WBC 4.2 4.0 - 10.5 K/uL   RBC 2.79 (L) 4.22 - 5.81 MIL/uL   Hemoglobin 9.3 (L) 13.0 - 17.0 g/dL   HCT 28.3 (L) 39.0 - 52.0 %   MCV 101.4 (H) 80.0 - 100.0 fL   MCH 33.3 26.0 - 34.0 pg   MCHC 32.9 30.0 - 36.0 g/dL   RDW 13.6 11.5 - 15.5 %   Platelets 145 (L) 150 - 400 K/uL   nRBC 0.0 0.0 - 0.2 %   Neutrophils Relative % 73 %   Neutro Abs 3.1 1.7 - 7.7 K/uL   Lymphocytes Relative 13 %   Lymphs Abs 0.5 (L) 0.7 - 4.0 K/uL   Monocytes Relative 11 %   Monocytes Absolute 0.5 0.1 - 1.0 K/uL   Eosinophils Relative 3 %   Eosinophils Absolute 0.1 0.0 - 0.5 K/uL   Basophils Relative 0 %   Basophils  Absolute 0.0 0.0 - 0.1 K/uL   Immature Granulocytes 0 %   Abs Immature Granulocytes 0.01 0.00 - 0.07 K/uL    Comment: Performed at [facility], [place] [address], [place], [place] [address]  Protime-INR     Status: None   Collection Time: 05/02/18 10:13 AM  Result Value Ref Range   Prothrombin Time 13.3 11.4 - 15.2 seconds   INR 1.02     Comment: Performed at [facility], [place] [address] [place], [place] [address]  Culture, blood (Routine x 2)     Status: None (Preliminary result)   Collection Time: 05/02/18 10:35 AM  Result Value Ref Range   Specimen Description      BLOOD RIGHT ANTECUBITAL Performed at [facility], [place] [address], [place], [place] [address]    Special Requests      BOTTLES DRAWN AEROBIC AND ANAEROBIC Blood Culture adequate volume Performed at [place] [address] [place], [place] [address]    Culture PENDING    Report Status PENDING   I-Stat CG4 Lactic Acid, ED     Status: None   Collection Time: 05/02/18 10:40 AM  Result Value Ref Range   Lactic Acid, Venous 0.66 0.5 - 1.9 mmol/L  Culture, blood (Routine x 2)     Status: None (Preliminary result)   Collection Time: 05/02/18 10:50 AM  Result Value Ref Range   Specimen Description      BLOOD LEFT ANTECUBITAL Performed at [facility], [place] [address], [place],  [address]    Special Requests      BOTTLES DRAWN AEROBIC AND ANAEROBIC Blood Culture adequate volume Performed at [place] [address] [place],  [address]    Culture PENDING    Report Status PENDING   I-Stat CG4 Lactic Acid, ED     Status: None   Collection Time: 05/02/18 12:23 PM  Result Value Ref Range   Lactic Acid, Venous 0.62 0.5 - 1.9 mmol/L  Urinalysis, Routine w reflex microscopic     Status: Abnormal   Collection Time:  05/02/18  2:34 PM  Result Value Ref Range   Color, Urine STRAW (A) YELLOW   APPearance CLEAR CLEAR    Specific Gravity, Urine 1.006 1.005 - 1.030   pH 6.0 5.0 - 8.0   Glucose, UA NEGATIVE NEGATIVE mg/dL   Hgb urine dipstick SMALL (A) NEGATIVE   Bilirubin Urine NEGATIVE NEGATIVE   Ketones, ur NEGATIVE NEGATIVE mg/dL   Protein, ur NEGATIVE NEGATIVE mg/dL   Nitrite NEGATIVE NEGATIVE   Leukocytes, UA NEGATIVE NEGATIVE   RBC / HPF 0-5 0 - 5 RBC/hpf   WBC, UA 0-5 0 - 5 WBC/hpf   Bacteria, UA NONE SEEN NONE SEEN    Comment: Performed at [facility], [place] [address], [place], [place] [address]   Dg Chest 2 View  Result Date: 05/02/2018 CLINICAL DATA:  Cough for 1 week EXAM: CHEST - 2 VIEW COMPARISON:  None. FINDINGS: The heart size and mediastinal contours are within normal limits. Both lungs are clear. The visualized skeletal structures are unremarkable. IMPRESSION: No active cardiopulmonary disease. Electronically Signed   By: Inez Catalina M.D.   On: 05/02/2018 12:11    Pending Labs Unresulted Labs (From admission, onward)    Start     Ordered   05/03/18 0500  Comprehensive metabolic panel  Tomorrow morning,   R     05/02/18 1718   05/03/18 0500  CBC  Tomorrow morning,   R     05/02/18 1718   05/02/18 1718  HIV antibody (Routine Testing)  Once,   R     05/02/18 1718   05/02/18 1715  MRSA PCR Screening  Once,   R     05/02/18 1714   05/02/18 1038  Urine culture  STAT,   STAT     05/02/18 1043          Vitals/Pain Today's Vitals   05/02/18 1700 05/02/18 1710 05/02/18 1730 05/02/18 1800  BP: (!) 149/99 (!) 149/99 (!) 141/82 140/72  Pulse: 70 73 65 65  Resp: (!) 25 18 (!) 22 (!) 21  Temp:      TempSrc:      SpO2: 100% 98% 98% 96%  Weight:      Height:      PainSc:        Isolation Precautions No active isolations  Medications Medications  enoxaparin (LOVENOX) injection 40 mg (40 mg Subcutaneous Given 05/02/18 1812)  acyclovir (ZOVIRAX) tablet 400 mg (has no administration in time range)  amLODipine (NORVASC) tablet 5 mg (5 mg Oral Given 05/02/18  1811)  aspirin EC tablet 81 mg (81 mg Oral Given 05/02/18 1812)  DULoxetine (CYMBALTA) DR capsule 20 mg (20 mg Oral Given 05/02/18 1812)  gabapentin (NEURONTIN) capsule 600 mg (has no administration in time range)  0.45 % sodium chloride infusion ( Intravenous New Bag/Given 05/02/18 1807)  insulin aspart (novoLOG) injection 0-15 Units (has no administration in time range)  ceFEPIme (MAXIPIME) 1 g in sodium chloride 0.9 % 100 mL IVPB (has no administration in time range)  gabapentin (NEURONTIN) capsule 300 mg (has no administration in time range)  vancomycin (VANCOCIN) 1,250 mg in sodium chloride 0.9 % 250 mL IVPB (has no administration in time range)  sodium chloride 0.9 % bolus 1,000 mL (0 mLs Intravenous Stopped 05/02/18 1222)    And  sodium chloride 0.9 % bolus 1,000 mL (0 mLs Intravenous Stopped 05/02/18 1442)    And  sodium chloride 0.9 % bolus 250 mL (0 mLs Intravenous Stopped 05/02/18 1442)  acetaminophen (TYLENOL) tablet 650 mg (650 mg Oral Given 05/02/18 1111)  vancomycin (VANCOCIN) 1,500 mg in sodium chloride 0.9 % 500 mL IVPB (0 mg Intravenous Stopped 05/02/18 1621)  ceFEPIme (MAXIPIME) 2 g in sodium chloride 0.9 % 100 mL IVPB (0 g Intravenous Stopped 05/02/18 1442)    Mobility walks

## 2018-05-02 NOTE — Plan of Care (Signed)
Patient does not need telemetry

## 2018-05-02 NOTE — ED Notes (Signed)
Patient has been made aware of need for urine sample. Patient stated they are unable to provide at this time.

## 2018-05-02 NOTE — ED Notes (Signed)
Admitting doctor has been notified to change floors if patient needs telemetry. This Probation officer is waiting on notification in order to give report to appropriate floor.

## 2018-05-03 ENCOUNTER — Observation Stay (HOSPITAL_COMMUNITY): Payer: Medicare HMO

## 2018-05-03 DIAGNOSIS — R05 Cough: Secondary | ICD-10-CM | POA: Diagnosis not present

## 2018-05-03 DIAGNOSIS — A419 Sepsis, unspecified organism: Principal | ICD-10-CM

## 2018-05-03 LAB — COMPREHENSIVE METABOLIC PANEL
ALBUMIN: 3.4 g/dL — AB (ref 3.5–5.0)
ALT: 11 U/L (ref 0–44)
ANION GAP: 7 (ref 5–15)
AST: 17 U/L (ref 15–41)
Alkaline Phosphatase: 37 U/L — ABNORMAL LOW (ref 38–126)
BILIRUBIN TOTAL: 0.9 mg/dL (ref 0.3–1.2)
BUN: 21 mg/dL (ref 8–23)
CHLORIDE: 107 mmol/L (ref 98–111)
CO2: 22 mmol/L (ref 22–32)
Calcium: 8.6 mg/dL — ABNORMAL LOW (ref 8.9–10.3)
Creatinine, Ser: 1.13 mg/dL (ref 0.61–1.24)
GFR calc Af Amer: >60 mL/min (ref >60)
GFR calc non Af Amer: >60 mL/min (ref >60)
GLUCOSE: 118 mg/dL — AB (ref 70–99)
POTASSIUM: 3.9 mmol/L (ref 3.5–5.1)
SODIUM: 136 mmol/L (ref 135–145)
Total Protein: 6.2 g/dL — ABNORMAL LOW (ref 6.5–8.1)

## 2018-05-03 LAB — CBC
HCT: 29.2 % — ABNORMAL LOW (ref 39.0–52.0)
HEMOGLOBIN: 9.1 g/dL — AB (ref 13.0–17.0)
MCH: 31.9 pg (ref 26.0–34.0)
MCHC: 31.2 g/dL (ref 30.0–36.0)
MCV: 102.5 fL — ABNORMAL HIGH (ref 80.0–100.0)
Platelets: 144 10*3/uL — ABNORMAL LOW (ref 150–400)
RBC: 2.85 MIL/uL — AB (ref 4.22–5.81)
RDW: 13.5 % (ref 11.5–15.5)
WBC: 2.9 10*3/uL — ABNORMAL LOW (ref 4.0–10.5)
nRBC: 0 % (ref 0.0–0.2)

## 2018-05-03 LAB — GLUCOSE, CAPILLARY
GLUCOSE-CAPILLARY: 112 mg/dL — AB (ref 70–99)
GLUCOSE-CAPILLARY: 126 mg/dL — AB (ref 70–99)
Glucose-Capillary: 108 mg/dL — ABNORMAL HIGH (ref 70–99)
Glucose-Capillary: 136 mg/dL — ABNORMAL HIGH (ref 70–99)
Glucose-Capillary: 43 mg/dL — CL (ref 70–99)
Glucose-Capillary: 63 mg/dL — ABNORMAL LOW (ref 70–99)

## 2018-05-03 LAB — HIV ANTIBODY (ROUTINE TESTING W REFLEX): HIV SCREEN 4TH GENERATION: NONREACTIVE

## 2018-05-03 LAB — PROCALCITONIN: Procalcitonin: <0.1 ng/mL

## 2018-05-03 MED ORDER — VITAMIN D 1000 UNITS PO TABS
2000.0000 [IU] | ORAL_TABLET | Freq: Every day | ORAL | Status: DC
  Administered 2018-05-03 – 2018-05-04 (×2): 2000 [IU] via ORAL
  Filled 2018-05-03 (×2): qty 2

## 2018-05-03 MED ORDER — PRAVASTATIN SODIUM 20 MG PO TABS
20.0000 mg | ORAL_TABLET | Freq: Every day | ORAL | Status: DC
  Administered 2018-05-03: 20 mg via ORAL
  Filled 2018-05-03: qty 1

## 2018-05-03 MED ORDER — INSULIN ASPART PROT & ASPART (70-30 MIX) 100 UNIT/ML ~~LOC~~ SUSP
7.0000 [IU] | Freq: Two times a day (BID) | SUBCUTANEOUS | Status: DC
  Administered 2018-05-03: 7 [IU] via SUBCUTANEOUS
  Filled 2018-05-03: qty 10

## 2018-05-03 MED ORDER — VITAMIN B-12 100 MCG PO TABS
100.0000 ug | ORAL_TABLET | Freq: Every day | ORAL | Status: DC
  Administered 2018-05-03 – 2018-05-04 (×2): 100 ug via ORAL
  Filled 2018-05-03 (×2): qty 1

## 2018-05-03 MED ORDER — ADULT MULTIVITAMIN W/MINERALS CH
1.0000 | ORAL_TABLET | Freq: Every day | ORAL | Status: DC
  Administered 2018-05-03 – 2018-05-04 (×2): 1 via ORAL
  Filled 2018-05-03 (×2): qty 1

## 2018-05-03 MED ORDER — FERROUS SULFATE 325 (65 FE) MG PO TABS
325.0000 mg | ORAL_TABLET | Freq: Every day | ORAL | Status: DC
  Administered 2018-05-03 – 2018-05-04 (×2): 325 mg via ORAL
  Filled 2018-05-03 (×2): qty 1

## 2018-05-03 NOTE — Care Management Note (Signed)
Case Management Note  Patient Details  Name: Robert Sparks MRN: [id number] Date of Birth: 13-Jul-1945  Subjective/Objective: From home w/roomate,uses a cane.PT cons-await recc.                   Action/Plan:d/c plan home.   Expected Discharge Date:  (unknown)               Expected Discharge Plan:     In-House Referral:     Discharge planning Services     Post Acute Care Choice:  Durable Medical Equipment(cane) Choice offered to:     DME Arranged:    DME Agency:     HH Arranged:    HH Agency:     Status of Service:  In process, will continue to follow  If discussed at Long Length of Stay Meetings, dates discussed:    Additional Comments:  Dessa Phi, RN 05/03/2018, 3:53 PM

## 2018-05-03 NOTE — Care Management Obs Status (Signed)
[place] NOTIFICATION   Patient Details  Name: Robert Sparks MRN: [id number] Date of Birth: 1946/04/23   Medicare Observation Status Notification Given:  Yes    Dessa Phi, RN 05/03/2018, 3:41 PM

## 2018-05-03 NOTE — Progress Notes (Addendum)
PROGRESS NOTE    Robert Sparks  JOA:[id number] DOB: Nov 30, 1945 DOA: 05/02/2018 PCP: [name], MD  Brief Narrative:  72 year old with past medical history relevant for prostate cancer status post treatment, IgA lambda chain multiple myeloma on treatment with iaxzomib in remission, type 2 diabetes on insulin, hypertension, hyperlipidemia, stage III CKD who presented with generalized weakness and chills as well as tachypnea is found to have sepsis of unclear source.   Assessment & Plan:   Active Problems:   Sepsis ([place])   HCAP (healthcare-associated pneumonia)   #) Sepsis of unclear source: Patient continues to be febrile this morning.  He otherwise clinically looks well.  He reports only a dry cough.  Repeat imaging of an x-ray showed no evidence of a pneumonia after IV fluids.  He denies any nausea, vomiting, diarrhea.  He has not had any skin rash. -Procalcitonin is quite low -Continue IV cefepime and vancomycin started 05/02/2017, will consider discontinuing once MRSA swab is negative and blood cultures are negative for 48 hours -Respiratory virus panel ordered  #) Hypertension/hyperlipidemia: - Continue aspirin 81 mg -Continue amlodipine 5 mg -Continue pravastatin 40 mg daily  #) Type 2 diabetes: -Sliding scale insulin, AC at bedtime -Continue 70/30 7 units twice daily  #) IgA lambda multiple myeloma: Currently on active treatment for this but in remission. -Continue acyclovir prophylaxis - Can you ixazomib as an outpatient  #) Prostate cancer: Diagnosed in 2017, Gleason score 4+3 status post radiation treatments  #) Pancytopenia: Unclear if related to chemotherapy versus infection.  He is chronically anemic. -We will order differential for tomorrow -Continue iron supple mentation -Continue B12 supplementation  #) Pain/psych: -Continue gabapentin 300 mg with breakfast and lunch and 600 mg nightly -Continue duloxetine 20 mg daily  Fluids: Gentle IV  fluids Electrodes: Monitor and supplement Nutrition: Carb restricted diet  Prophylaxis:   Consultants:   None  Procedures:   None  Antimicrobials:   IV cefepime and vancomycin started 05/02/2018   Subjective: Patient reports he is doing better than when he came in.  He continues to report a bit of a dry cough but denies any nausea, vomiting, diarrhea, congestion.  Objective: Vitals:   05/02/18 1800 05/02/18 1853 05/02/18 2113 05/03/18 0514  BP: 140/72 (!) 143/74 140/73 135/78  Pulse: 65 67 67 65  Resp: (!) '21 16 19 19  ' Temp:  98.1 F (36.7 C) 98.2 F (36.8 C) 98.5 F (36.9 C)  TempSrc:  Oral Oral Oral  SpO2: 96% 100% 98% 98%  Weight:      Height:        Intake/Output Summary (Last 24 hours) at 05/03/2018 1019 Last data filed at 05/03/2018 0800 Gross per 24 hour  Intake 1928.11 ml  Output 1950 ml  Net -21.89 ml   Filed Weights   05/02/18 1009  Weight: 68.9 kg    Examination:  General exam: Appears calm and comfortable  Respiratory system: Clear to auscultation. Respiratory effort normal.  No crackles, wheezes, rhonchi Cardiovascular system: Regular rate and rhythm, no murmurs. Gastrointestinal system: Abdomen is nondistended, soft and nontender. No organomegaly or masses felt. Normal bowel sounds heard. Central nervous system: Alert and oriented. No focal neurological deficits. Extremities: No lower extremity edema Skin: No rashes, over visible skin Psychiatry: Judgement and insight appear normal. Mood & affect appropriate.     Data Reviewed: I have personally reviewed following labs and imaging studies  CBC: Recent Labs  Lab 05/02/18 1013 05/03/18 0438  WBC 4.2 2.9*  NEUTROABS  3.1  --   HGB 9.3* 9.1*  HCT 28.3* 29.2*  MCV 101.4* 102.5*  PLT 145* 662*   Basic Metabolic Panel: Recent Labs  Lab 05/02/18 1013 05/03/18 0438  NA 135 136  K 4.1 3.9  CL 104 107  CO2 23 22  GLUCOSE 111* 118*  BUN 27* 21  CREATININE 1.44* 1.13  CALCIUM  8.8* 8.6*   GFR: Estimated Creatinine Clearance: 50.9 mL/min (by C-G formula based on SCr of 1.13 mg/dL). Liver Function Tests: Recent Labs  Lab 05/02/18 1013 05/03/18 0438  AST 20 17  ALT 14 11  ALKPHOS 50 37*  BILITOT 0.5 0.9  PROT 6.5 6.2*  ALBUMIN 3.7 3.4*   No results for input(s): LIPASE, AMYLASE in the last 168 hours. No results for input(s): AMMONIA in the last 168 hours. Coagulation Profile: Recent Labs  Lab 05/02/18 1013  INR 1.02   Cardiac Enzymes: No results for input(s): CKTOTAL, CKMB, CKMBINDEX, TROPONINI in the last 168 hours. BNP (last 3 results) No results for input(s): PROBNP in the last 8760 hours. HbA1C: No results for input(s): HGBA1C in the last 72 hours. CBG: Recent Labs  Lab 05/02/18 2116 05/03/18 0721  GLUCAP 84 112*   Lipid Profile: No results for input(s): CHOL, HDL, LDLCALC, TRIG, CHOLHDL, LDLDIRECT in the last 72 hours. Thyroid Function Tests: No results for input(s): TSH, T4TOTAL, FREET4, T3FREE, THYROIDAB in the last 72 hours. Anemia Panel: No results for input(s): VITAMINB12, FOLATE, FERRITIN, TIBC, IRON, RETICCTPCT in the last 72 hours. Sepsis Labs: Recent Labs  Lab 05/02/18 1040 05/02/18 1223 05/03/18 0737  PROCALCITON  --   --  <0.10  LATICACIDVEN 0.66 0.62  --     Recent Results (from the past 240 hour(s))  Culture, blood (Routine x 2)     Status: None (Preliminary result)   Collection Time: 05/02/18 10:35 AM  Result Value Ref Range Status   Specimen Description   Final    BLOOD RIGHT ANTECUBITAL Performed at [facility], [place] [address], [place], [place] [address]    Special Requests   Final    BOTTLES DRAWN AEROBIC AND ANAEROBIC Blood Culture adequate volume Performed at [place] [facility] [address] [place], [place] [address]    Culture   Final    NO GROWTH < 24 HOURS Performed at [place] [address] [place], [place] [address]    Report Status PENDING  Incomplete   Culture, blood (Routine x 2)     Status: None (Preliminary result)   Collection Time: 05/02/18 10:50 AM  Result Value Ref Range Status   Specimen Description   Final    BLOOD LEFT ANTECUBITAL Performed at [facility], [place] [address] [place], [place] [address]    Special Requests   Final    BOTTLES DRAWN AEROBIC AND ANAEROBIC Blood Culture adequate volume Performed at [facility] [address], [place], [place] [address]    Culture   Final    NO GROWTH < 24 HOURS Performed at [facility] [address] [place], [place] [address]    Report Status PENDING  Incomplete  MRSA PCR Screening     Status: None   Collection Time: 05/02/18  5:50 PM  Result Value Ref Range Status   MRSA by PCR NEGATIVE NEGATIVE Final    Comment:        The GeneXpert MRSA Assay (FDA approved for NASAL specimens only), is one component of  a comprehensive MRSA colonization surveillance program. It is not intended to diagnose MRSA infection nor to guide or monitor treatment for MRSA infections. Performed at [facility], [place] [address], [place], [place] [address]          Radiology Studies: Dg Chest 1 View  Result Date: 05/03/2018 CLINICAL DATA:  Cough. EXAM: CHEST  1 VIEW COMPARISON:  05/02/2018 FINDINGS: Lungs are adequately inflated and otherwise clear. Cardiomediastinal silhouette and remainder of the exam is unchanged. IMPRESSION: No active disease. Electronically Signed   By: Marin Olp M.D.   On: 05/03/2018 09:49   Dg Chest 2 View  Result Date: 05/02/2018 CLINICAL DATA:  Cough for 1 week EXAM: CHEST - 2 VIEW COMPARISON:  None. FINDINGS: The heart size and mediastinal contours are within normal limits. Both lungs are clear. The visualized skeletal structures are unremarkable. IMPRESSION: No active cardiopulmonary disease. Electronically Signed   By: Inez Catalina M.D.   On: 05/02/2018 12:11        Scheduled Meds: . acyclovir   400 mg Oral BID  . amLODipine  5 mg Oral Daily  . aspirin EC  81 mg Oral Daily  . DULoxetine  20 mg Oral Daily  . enoxaparin (LOVENOX) injection  40 mg Subcutaneous Q24H  . gabapentin  300 mg Oral BID WC  . gabapentin  600 mg Oral QHS  . insulin aspart  0-15 Units Subcutaneous TID WC   Continuous Infusions: . sodium chloride 125 mL/hr at 05/03/18 0241  . ceFEPime (MAXIPIME) IV 1 g (05/02/18 2123)  . [START ON 05/04/2018] vancomycin       LOS: 0 days    Time spent: Lexa, MD [facility]  If 7PM-7AM, please contact night-coverage [email or web address] Password [email or web address] 05/03/2018, 10:19 AM

## 2018-05-04 DIAGNOSIS — A419 Sepsis, unspecified organism: Secondary | ICD-10-CM | POA: Diagnosis not present

## 2018-05-04 LAB — CBC WITH DIFFERENTIAL/PLATELET
Abs Immature Granulocytes: 0.01 10*3/uL (ref 0.00–0.07)
Basophils Absolute: 0 K/uL (ref 0.0–0.1)
Basophils Relative: 1 %
Eosinophils Absolute: 0.7 K/uL — ABNORMAL HIGH (ref 0.0–0.5)
Eosinophils Relative: 21 %
HCT: 30.6 % — ABNORMAL LOW (ref 39.0–52.0)
Hemoglobin: 9.8 g/dL — ABNORMAL LOW (ref 13.0–17.0)
Immature Granulocytes: 0 %
Lymphocytes Relative: 17 %
Lymphs Abs: 0.6 K/uL — ABNORMAL LOW (ref 0.7–4.0)
MCH: 32.2 pg (ref 26.0–34.0)
MCHC: 32 g/dL (ref 30.0–36.0)
MCV: 100.7 fL — ABNORMAL HIGH (ref 80.0–100.0)
Monocytes Absolute: 0.4 K/uL (ref 0.1–1.0)
Monocytes Relative: 11 %
Neutro Abs: 1.7 10*3/uL (ref 1.7–7.7)
Neutrophils Relative %: 50 %
Platelets: 151 10*3/uL (ref 150–400)
RBC: 3.04 MIL/uL — ABNORMAL LOW (ref 4.22–5.81)
RDW: 13.2 % (ref 11.5–15.5)
WBC: 3.4 10*3/uL — ABNORMAL LOW (ref 4.0–10.5)
nRBC: 0 % (ref 0.0–0.2)

## 2018-05-04 LAB — RESPIRATORY PANEL BY PCR

## 2018-05-04 LAB — PROCALCITONIN: Procalcitonin: <0.1 ng/mL

## 2018-05-04 LAB — BASIC METABOLIC PANEL WITH GFR
BUN: 20 mg/dL (ref 8–23)
CO2: 23 mmol/L (ref 22–32)
Creatinine, Ser: 1.21 mg/dL (ref 0.61–1.24)
GFR calc Af Amer: >60 mL/min (ref >60)
Glucose, Bld: 159 mg/dL — ABNORMAL HIGH (ref 70–99)

## 2018-05-04 LAB — BASIC METABOLIC PANEL
Anion gap: 7 (ref 5–15)
Calcium: 8.9 mg/dL (ref 8.9–10.3)
Chloride: 104 mmol/L (ref 98–111)
GFR calc non Af Amer: 59 mL/min — ABNORMAL LOW (ref >60)
Potassium: 3.7 mmol/L (ref 3.5–5.1)
Sodium: 134 mmol/L — ABNORMAL LOW (ref 135–145)

## 2018-05-04 LAB — URINE CULTURE: Culture: NO GROWTH

## 2018-05-04 LAB — GLUCOSE, CAPILLARY
GLUCOSE-CAPILLARY: 155 mg/dL — AB (ref 70–99)
Glucose-Capillary: 190 mg/dL — ABNORMAL HIGH (ref 70–99)

## 2018-05-04 LAB — MAGNESIUM: Magnesium: 2.1 mg/dL (ref 1.7–2.4)

## 2018-05-04 NOTE — Evaluation (Signed)
Physical Therapy Evaluation Patient Details Name: Robert Sparks MRN: [id number] DOB: 02-09-1946 Today's Date: 05/04/2018   History of Present Illness  Pt admitted 2* weakness and dx with HCAP and sepsis and with hx of DM, CKD, periph neuropathy, prostate CA and bone marrow CA durrently on chemo  Clinical Impression  Pt admitted as above and presenting with functional mobility limitations 2* generalized weakness and ambulatory balance deficits.  Pt ambulating with widened BOS to compensate for deficits and with no LOB ambulating level surfaces or stepping bkwd or sideways.  Pt demonstrating increased difficulty with attempts at higher level balance challenges but states he feels he is at baseline level and feels comfortable returning home with no PT follow up.    Follow Up Recommendations No PT follow up    Equipment Recommendations  None recommended by PT    Recommendations for Other Services       Precautions / Restrictions Precautions Precautions: Fall Restrictions Weight Bearing Restrictions: No      Mobility  Bed Mobility Overal bed mobility: Modified Independent             General bed mobility comments: Pt in/out bed unassisted  Transfers Overall transfer level: Modified independent Equipment used: None Transfers: Sit to/from Stand Sit to Stand: Modified independent (Device/Increase time)            Ambulation/Gait Ambulation/Gait assistance: Supervision;Modified independent (Device/Increase time);Independent Gait Distance (Feet): 300 Feet Assistive device: None;Straight cane Gait Pattern/deviations: Step-through pattern;Shuffle;Trunk flexed;Wide base of support Gait velocity: mod pace   General Gait Details: Pt with mild instability compensated with wide BOS;  NO LOB with SPC or sans AD;  distance ltd by LE fatigue - pt states his back limits ambulation  Stairs Stairs: Yes Stairs assistance: Supervision Stair Management: One rail  Right;Forwards;Alternating pattern Number of Stairs: 4    Wheelchair Mobility    Modified Rankin (Stroke Patients Only)       Balance Overall balance assessment: Needs assistance Sitting-balance support: No upper extremity supported;Feet supported Sitting balance-Leahy Scale: Normal     Standing balance support: No upper extremity supported Standing balance-Leahy Scale: Good   Single Leg Stance - Right Leg: 0 Single Leg Stance - Left Leg: 0 Tandem Stance - Right Leg: 2 Tandem Stance - Left Leg: 2                     Pertinent Vitals/Pain Pain Assessment: No/denies pain    Home Living Family/patient expects to be discharged to:: Private residence Living Arrangements: Other (Comment)(Roommates) Available Help at Discharge: Friend(s) Type of Home: House Home Access: Stairs to enter Entrance Stairs-Rails: Right Entrance Stairs-Number of Steps: 4 Home Layout: One level Home Equipment: Environmental consultant - 2 wheels;Cane - single point      Prior Function Level of Independence: Independent;Independent with assistive device(s)               Hand Dominance        Extremity/Trunk Assessment   Upper Extremity Assessment Upper Extremity Assessment: Generalized weakness    Lower Extremity Assessment Lower Extremity Assessment: Generalized weakness       Communication   Communication: No difficulties  Cognition Arousal/Alertness: Awake/alert Behavior During Therapy: WFL for tasks assessed/performed Overall Cognitive Status: Within Functional Limits for tasks assessed  General Comments      Exercises     Assessment/Plan    PT Assessment Patent does not need any further PT services  PT Problem List         PT Treatment Interventions      PT Goals (Current goals can be found in the Care Plan section)  Acute Rehab PT Goals Patient Stated Goal: HOME    Frequency     Barriers to discharge         Co-evaluation               AM-PAC PT "6 Clicks" Daily Activity  Outcome Measure Difficulty turning over in bed (including adjusting bedclothes, sheets and blankets)?: None Difficulty moving from lying on back to sitting on the side of the bed? : None Difficulty sitting down on and standing up from a chair with arms (e.g., wheelchair, bedside commode, etc,.)?: A Little Help needed moving to and from a bed to chair (including a wheelchair)?: None Help needed walking in hospital room?: None Help needed climbing 3-5 steps with a railing? : A Little 6 Click Score: 22    End of Session Equipment Utilized During Treatment: Gait belt Activity Tolerance: Patient tolerated treatment well;Patient limited by fatigue Patient left: in chair;with call bell/phone within reach;with chair alarm set Nurse Communication: Mobility status PT Visit Diagnosis: Unsteadiness on feet (R26.81)    Time: 3419-3790 PT Time Calculation (min) (ACUTE ONLY): 16 min   Charges:   PT Evaluation $PT Eval Low Complexity: [place] Pager [phone] Office [phone]   Naarah Borgerding 05/04/2018, 1:24 PM

## 2018-05-04 NOTE — Discharge Instructions (Signed)
Fever, Adult A fever is an increase in the body's temperature. It is often defined as a temperature of 100 F (38C) or higher. Short mild or moderate fevers often have no long-term effects. They also often do not need treatment. Moderate or high fevers may make you feel uncomfortable. Sometimes, they can also be a sign of a serious illness or disease. The sweating that may happen with repeated fevers or fevers that last a while may also cause you to not have enough fluid in your body (dehydration). You can take your temperature with a thermometer to see if you have a fever. A measured temperature can change with:  Age.  Time of day.  Where the thermometer is placed: ? Mouth (oral). ? Rectum (rectal). ? Ear (tympanic). ? Underarm (axillary). ? Forehead (temporal).  Follow these instructions at home: Pay attention to any changes in your symptoms. Take these actions to help with your condition:  Take over-the-counter and prescription medicines only as told by your doctor. Follow the dosing instructions carefully.  If you were prescribed an antibiotic medicine, take it as told by your doctor. Do not stop taking the antibiotic even if you start to feel better.  Rest as needed.  Drink enough fluid to keep your pee (urine) clear or pale yellow.  Sponge yourself or bathe with room-temperature water as needed. This helps to lower your body temperature . Do not use ice water.  Do not wear too many blankets or heavy clothes.  Contact a doctor if:  You throw up (vomit).  You cannot eat or drink without throwing up.  You have watery poop (diarrhea).  It hurts when you pee.  Your symptoms do not get better with treatment.  You have new symptoms.  You feel very weak. Get help right away if:  You are short of breath or have trouble breathing.  You are dizzy or you pass out (faint).  You feel confused.  You have signs of not having enough fluid in your body, such as: ? A dry  mouth. ? Peeing less. ? Looking pale.  You have very bad pain in your belly (abdomen).  You keep throwing up or having water poop.  You have a skin rash.  Your symptoms suddenly get worse. This information is not intended to replace advice given to you by your health care provider. Make sure you discuss any questions you have with your health care provider. Document Released: 04/04/2008 Document Revised: 12/02/2015 Document Reviewed: 08/20/2014 Elsevier Interactive Patient Education  2018 [employer]vier Inc.  

## 2018-05-04 NOTE — Progress Notes (Signed)
Pt to d/c home in stable condition to meet ride downstairs in lobby. No s/s of distress at time of d/c. Pt was walking around room with no needs prior to d/c.

## 2018-05-04 NOTE — Discharge Summary (Signed)
Physician Discharge Summary  Robert Sparks DZH:[id number] DOB: 09/21/1945 DOA: 05/02/2018  PCP: [name], MD  Admit date: 05/02/2018 Discharge date: 05/04/2018  Admitted From: Home Disposition: Home  Recommendations for Outpatient Follow-up:  1. Follow up with PCP in 1-2 weeks 2. Please obtain BMP/CBC in one week 3. Please follow up on the following pending results: Blood cultures from 05/02/2018   Home Health: No Equipment/Devices: No  Discharge Condition: stable CODE STATUS: FULL Diet recommendation: Carb Modified   Brief/Interim Summary:  #) Sepsis of unclear source: Patient presented with fever as well as tachycardia.  His only symptom was a dry cough.  He did have significant imaging with 2 x-rays sequentially that did not show any evidence of pneumonia.  He was given IV fluids.  He did not have any other localizing signs or symptoms.  His procalcitonin on 2 rechecks was undetectable.  He was given empiric cefepime and vancomycin for 48 hours while blood cultures from 05/02/2018 were negative for 48 hours.  Respiratory virus panel was negative.  Patient's fever resolved without any further treatment.  #) Hypertension/hyperlipidemia: Patient was continued on home aspirin, amlodipine, pravastatin.  #) Type 2 diabetes: Patient was continued on home 70/30 as well as sliding scale.  #) IgA lambda multiple myeloma: Patient was continued on acyclovir prophylaxis.  His home iaxzomib was held and can be restarted as an outpatient.  #) Prostate cancer: This was diagnosed in 2017 and he is status post radiation treatments.  He is currently in remission.  #)pancytopenia: This was attributed to both chemotherapy and possible viral illness.  He is neutropenia and pancytopenia improved on lab checks.  He should have his blood work checked in approximately 1 week.  He was continued on his home iron supplementation and B12 supplementation.  #) Pain/psych: Patient was continued on  home duloxetine and gabapentin.  Discharge Diagnoses:  Active Problems:   Sepsis ([place])   HCAP (healthcare-associated pneumonia)    Discharge Instructions  Discharge Instructions    Diet - low sodium heart healthy   Complete by:  As directed    Discharge instructions   Complete by:  As directed    Please follow-up with your primary care doctor in 1 week.  Please follow-up with your cancer doctor.   Increase activity slowly   Complete by:  As directed      Allergies as of 05/04/2018   No Known Allergies     Medication List    STOP taking these medications   lidocaine-prilocaine cream Commonly known as:  EMLA   mometasone 0.1 % cream Commonly known as:  ELOCON   ondansetron 8 MG tablet Commonly known as:  ZOFRAN   polyethylene glycol powder powder Commonly known as:  GLYCOLAX/MIRALAX   prochlorperazine 10 MG tablet Commonly known as:  COMPAZINE     TAKE these medications   ACCU-CHEK AVIVA PLUS test strip Generic drug:  glucose blood 1 each by Other route See admin instructions.   acyclovir 400 MG tablet Commonly known as:  ZOVIRAX Take 1 tablet (400 mg total) by mouth 2 (two) times daily.   amLODipine 5 MG tablet Commonly known as:  NORVASC Take 1 tablet (5 mg total) by mouth daily.   aspirin EC 81 MG tablet Take 1 tablet (81 mg total) by mouth daily.   diclofenac sodium 1 % Gel Commonly known as:  VOLTAREN Apply 2 g topically 4 (four) times daily. What changed:    when to take this  reasons to  take this   DULoxetine 20 MG capsule Commonly known as:  CYMBALTA Take 1 capsule (20 mg total) by mouth daily.   ferrous sulfate 325 (65 FE) MG tablet Take 325 mg by mouth daily.   gabapentin 300 MG capsule Commonly known as:  NEURONTIN TAKE 1 CAPSULE BY MOUTH IN THE MORNING AND AT LUNCH, TAKE 2 CAPSULES BY MOUTH AT NIGHT. What changed:  See the new instructions.   insulin aspart protamine - aspart (70-30) 100 UNIT/ML FlexPen Commonly known as:   NOVOLOG 70/30 MIX Inject 0.07 mLs (7 Units total) into the skin 2 (two) times daily with a meal.   Insulin Pen Needle 32G X 4 MM Misc Use as directed twice daily with insulin   ixazomib citrate 3 MG capsule Commonly known as:  NINLARO Take 1 capsule by mouth on Day 1, Day 8 & D15 of each 28d cycle. Take on empty stomach 1hr before or 2hrs after food. Do not crush,chew,open What changed:    how much to take  how to take this  when to take this  additional instructions   multivitamin with minerals Tabs tablet Take 1 tablet by mouth daily.   pravastatin 20 MG tablet Commonly known as:  PRAVACHOL Take 1 tablet (20 mg total) by mouth daily.   tadalafil 20 MG tablet Commonly known as:  ADCIRCA/CIALIS Take 20 mg by mouth daily as needed for erectile dysfunction.   triamcinolone lotion 0.1 % Commonly known as:  KENALOG Apply 1 application topically 3 (three) times daily as needed. What changed:  reasons to take this   vitamin B-12 100 MCG tablet Commonly known as:  CYANOCOBALAMIN Take 100 mcg by mouth daily.   Vitamin D 2000 units Caps Take 2,000 Units by mouth daily.       No Known Allergies  Consultations:  None   Procedures/Studies: Dg Chest 1 View  Result Date: 05/03/2018 CLINICAL DATA:  Cough. EXAM: CHEST  1 VIEW COMPARISON:  05/02/2018 FINDINGS: Lungs are adequately inflated and otherwise clear. Cardiomediastinal silhouette and remainder of the exam is unchanged. IMPRESSION: No active disease. Electronically Signed   By: Marin Olp M.D.   On: 05/03/2018 09:49   Dg Chest 2 View  Result Date: 05/02/2018 CLINICAL DATA:  Cough for 1 week EXAM: CHEST - 2 VIEW COMPARISON:  None. FINDINGS: The heart size and mediastinal contours are within normal limits. Both lungs are clear. The visualized skeletal structures are unremarkable. IMPRESSION: No active cardiopulmonary disease. Electronically Signed   By: Inez Catalina M.D.   On: 05/02/2018 12:11      Subjective:   Discharge Exam: Vitals:   05/04/18 0453 05/04/18 0925  BP: 137/74   Pulse: 65   Resp: 16   Temp: 98.1 F (36.7 C) 97.6 F (36.4 C)  SpO2: 98%    Vitals:   05/03/18 1359 05/03/18 2203 05/04/18 0453 05/04/18 0925  BP: 133/75 (!) 142/77 137/74   Pulse: 69 63 65   Resp: '16 16 16   ' Temp: 98.4 F (36.9 C) 97.9 F (36.6 C) 98.1 F (36.7 C) 97.6 F (36.4 C)  TempSrc:  Oral Oral Oral  SpO2: 97% 99% 98%   Weight:      Height:       General exam: Appears calm and comfortable  Respiratory system: Clear to auscultation. Respiratory effort normal.  No crackles, wheezes, rhonchi Cardiovascular system: Regular rate and rhythm, no murmurs. Gastrointestinal system: Abdomen is nondistended, soft and nontender. No organomegaly or masses felt. Normal bowel  sounds heard. Central nervous system: Alert and oriented. No focal neurological deficits. Extremities: No lower extremity edema Skin: No rashes, over visible skin Psychiatry: Judgement and insight appear normal. Mood & affect appropriate.     The results of significant diagnostics from this hospitalization (including imaging, microbiology, ancillary and laboratory) are listed below for reference.     Microbiology: Recent Results (from the past 240 hour(s))  Culture, blood (Routine x 2)     Status: None (Preliminary result)   Collection Time: 05/02/18 10:35 AM  Result Value Ref [place] Status   Specimen Description   Final    BLOOD RIGHT ANTECUBITAL Performed at [facility], [place] [address] [place], [place] [address]    Special Requests   Final    BOTTLES DRAWN AEROBIC AND ANAEROBIC Blood Culture adequate volume Performed at [place] [address] [place], [place] [address]    Culture   Final    NO GROWTH 1 DAY Performed at [facility], [place] [address], [place], [place] [address]    Report Status PENDING  Incomplete  Culture, blood (Routine x 2)     Status: None  (Preliminary result)   Collection Time: 05/02/18 10:50 AM  Result Value Ref [place] Status   Specimen Description   Final    BLOOD LEFT ANTECUBITAL Performed at [facility], [place] [address], [place], [place] [address]    Special Requests   Final    BOTTLES DRAWN AEROBIC AND ANAEROBIC Blood Culture adequate volume Performed at [place] [address], [place], [place] [address]    Culture   Final    NO GROWTH 1 DAY Performed at [facility], [place] [address], [place], [place] [address]    Report Status PENDING  Incomplete  Urine culture     Status: None   Collection Time: 05/02/18  2:34 PM  Result Value Ref [place] Status   Specimen Description   Final    URINE, RANDOM Performed at [place] [address], [place], [place] [address]    Special Requests   Final    NONE Performed at [facility], [place] [address], [place], [place] [address]    Culture   Final    NO GROWTH Performed at [facility], [place] [address] [place], [place] [address]    Report Status 05/04/2018 FINAL  Final  MRSA PCR Screening     Status: None   Collection Time: 05/02/18  5:50 PM  Result Value Ref [place] Status   MRSA by PCR NEGATIVE NEGATIVE Final    Comment:        The GeneXpert MRSA Assay (FDA approved for NASAL specimens only), is one component of a comprehensive MRSA colonization surveillance program. It is not intended to diagnose MRSA infection nor to guide or monitor treatment for MRSA infections. Performed at [facility], [place] [address] [place],  [address]      Labs: BNP (last 3 results) No results for input(s): BNP in the last 8760 hours. Basic Metabolic Panel: Recent Labs  Lab 05/02/18 1013 05/03/18 0438 05/04/18 0325  NA 135 136 134*  K 4.1 3.9 3.7  CL 104 107 104  CO2 '23 22 23  ' GLUCOSE 111* 118* 159*  BUN 27* 21 20  CREATININE 1.44* 1.13 1.21  CALCIUM 8.8* 8.6* 8.9   MG  --   --  2.1   Liver Function Tests: Recent  Labs  Lab 05/02/18 1013 05/03/18 0438  AST 20 17  ALT 14 11  ALKPHOS 50 37*  BILITOT 0.5 0.9  PROT 6.5 6.2*  ALBUMIN 3.7 3.4*   No results for input(s): LIPASE, AMYLASE in the last 168 hours. No results for input(s): AMMONIA in the last 168 hours. CBC: Recent Labs  Lab 05/02/18 1013 05/03/18 0438 05/04/18 0325  WBC 4.2 2.9* 3.4*  NEUTROABS 3.1  --  1.7  HGB 9.3* 9.1* 9.8*  HCT 28.3* 29.2* 30.6*  MCV 101.4* 102.5* 100.7*  PLT 145* 144* 151   Cardiac Enzymes: No results for input(s): CKTOTAL, CKMB, CKMBINDEX, TROPONINI in the last 168 hours. BNP: Invalid input(s): POCBNP CBG: Recent Labs  Lab 05/03/18 1709 05/03/18 2201 05/03/18 2251 05/03/18 2335 05/04/18 0811  GLUCAP 136* 43* 63* 108* 190*   D-Dimer No results for input(s): DDIMER in the last 72 hours. Hgb A1c No results for input(s): HGBA1C in the last 72 hours. Lipid Profile No results for input(s): CHOL, HDL, LDLCALC, TRIG, CHOLHDL, LDLDIRECT in the last 72 hours. Thyroid function studies No results for input(s): TSH, T4TOTAL, T3FREE, THYROIDAB in the last 72 hours.  Invalid input(s): FREET3 Anemia work up No results for input(s): VITAMINB12, FOLATE, FERRITIN, TIBC, IRON, RETICCTPCT in the last 72 hours. Urinalysis    Component Value Date/Time   COLORURINE STRAW (A) 05/02/2018 1434   APPEARANCEUR CLEAR 05/02/2018 1434   LABSPEC 1.006 05/02/2018 1434   PHURINE 6.0 05/02/2018 1434   GLUCOSEU NEGATIVE 05/02/2018 1434   HGBUR SMALL (A) 05/02/2018 1434   BILIRUBINUR NEGATIVE 05/02/2018 1434   KETONESUR NEGATIVE 05/02/2018 1434   PROTEINUR NEGATIVE 05/02/2018 1434   UROBILINOGEN 1.0 03/29/2015 1941   NITRITE NEGATIVE 05/02/2018 1434   LEUKOCYTESUR NEGATIVE 05/02/2018 1434   Sepsis Labs Invalid input(s): PROCALCITONIN,  WBC,  LACTICIDVEN Microbiology Recent Results (from the past 240 hour(s))  Culture, blood (Routine x 2)     Status: None  (Preliminary result)   Collection Time: 05/02/18 10:35 AM  Result Value Ref [place] Status   Specimen Description   Final    BLOOD RIGHT ANTECUBITAL Performed at [facility], [place] [address], [place], [place] [address]    Special Requests   Final    BOTTLES DRAWN AEROBIC AND ANAEROBIC Blood Culture adequate volume Performed at [facility] [address] [place], [place] [address]    Culture   Final    NO GROWTH 1 DAY Performed at [facility], [place] [address], [place], [place] [address]    Report Status PENDING  Incomplete  Culture, blood (Routine x 2)     Status: None (Preliminary result)   Collection Time: 05/02/18 10:50 AM  Result Value Ref [place] Status   Specimen Description   Final    BLOOD LEFT ANTECUBITAL Performed at [facility], [place] [address] [place], [place] [address]    Special Requests   Final    BOTTLES DRAWN AEROBIC AND ANAEROBIC Blood Culture adequate volume Performed at [place] [address] [place], [place] [address]    Culture   Final    NO GROWTH 1 DAY Performed at [facility], [place] [address] [place],  [address]    Report Status PENDING  Incomplete  Urine culture     Status: None   Collection Time: 05/02/18  2:34 PM  Result Value Ref [place] Status   Specimen Description   Final    URINE, RANDOM Performed at  [facility], [place] [address] [place], [place] [address]    Special Requests   Final    NONE Performed at [facility], [place] [address], [place], [place] [address]    Culture   Final    NO GROWTH Performed at [facility], [place] [address] [place], [place] [address]    Report Status 05/04/2018 FINAL  Final  MRSA PCR Screening     Status: None   Collection Time: 05/02/18  5:50 PM  Result Value Ref [place] Status   MRSA by PCR NEGATIVE NEGATIVE Final    Comment:        The GeneXpert MRSA Assay (FDA approved for NASAL  specimens only), is one component of a comprehensive MRSA colonization surveillance program. It is not intended to diagnose MRSA infection nor to guide or monitor treatment for MRSA infections. Performed at [facility], [place] [address], [place], [place] [address]      Time coordinating discharge: 34  SIGNED:   Cristy Folks, MD  [facility] 05/04/2018, 10:07 AM  If 7PM-7AM, please contact night-coverage [email or web address] Password [email or web address]

## 2018-05-04 NOTE — Plan of Care (Signed)
Pt stable with no needs. Pt did well with physical therapy. No s/s of distress or pain. Rn will continue to monitor until d/c.

## 2018-05-06 ENCOUNTER — Encounter: Payer: Self-pay | Admitting: Hematology

## 2018-05-06 ENCOUNTER — Inpatient Hospital Stay: Payer: Medicare HMO | Attending: Hematology and Oncology

## 2018-05-06 ENCOUNTER — Inpatient Hospital Stay (HOSPITAL_BASED_OUTPATIENT_CLINIC_OR_DEPARTMENT_OTHER): Payer: Medicare HMO | Admitting: Hematology

## 2018-05-06 VITALS — BP 120/65 | HR 74 | Temp 98.2°F | Resp 20 | Ht 64.0 in | Wt 152.9 lb

## 2018-05-06 DIAGNOSIS — E1122 Type 2 diabetes mellitus with diabetic chronic kidney disease: Secondary | ICD-10-CM | POA: Diagnosis not present

## 2018-05-06 DIAGNOSIS — I129 Hypertensive chronic kidney disease with stage 1 through stage 4 chronic kidney disease, or unspecified chronic kidney disease: Secondary | ICD-10-CM | POA: Diagnosis not present

## 2018-05-06 DIAGNOSIS — N183 Chronic kidney disease, stage 3 (moderate): Secondary | ICD-10-CM | POA: Diagnosis not present

## 2018-05-06 DIAGNOSIS — E1140 Type 2 diabetes mellitus with diabetic neuropathy, unspecified: Secondary | ICD-10-CM | POA: Diagnosis not present

## 2018-05-06 DIAGNOSIS — K5900 Constipation, unspecified: Secondary | ICD-10-CM

## 2018-05-06 DIAGNOSIS — Z5111 Encounter for antineoplastic chemotherapy: Secondary | ICD-10-CM

## 2018-05-06 DIAGNOSIS — C9001 Multiple myeloma in remission: Secondary | ICD-10-CM

## 2018-05-06 LAB — CBC WITH DIFFERENTIAL/PLATELET
Abs Immature Granulocytes: 0.01 10*3/uL (ref 0.00–0.07)
BASOS PCT: 1 %
Basophils Absolute: 0 10*3/uL (ref 0.0–0.1)
EOS PCT: 22 %
Eosinophils Absolute: 0.9 10*3/uL — ABNORMAL HIGH (ref 0.0–0.5)
HCT: 30 % — ABNORMAL LOW (ref 39.0–52.0)
HEMOGLOBIN: 9.6 g/dL — AB (ref 13.0–17.0)
Immature Granulocytes: 0 %
LYMPHS PCT: 18 %
Lymphs Abs: 0.8 10*3/uL (ref 0.7–4.0)
MCH: 32.4 pg (ref 26.0–34.0)
MCHC: 32 g/dL (ref 30.0–36.0)
MCV: 101.4 fL — ABNORMAL HIGH (ref 80.0–100.0)
Monocytes Absolute: 0.4 10*3/uL (ref 0.1–1.0)
Monocytes Relative: 10 %
NEUTROS ABS: 2.1 10*3/uL (ref 1.7–7.7)
NRBC: 0 % (ref 0.0–0.2)
Neutrophils Relative %: 49 %
PLATELETS: 188 10*3/uL (ref 150–400)
RBC: 2.96 MIL/uL — AB (ref 4.22–5.81)
RDW: 13.2 % (ref 11.5–15.5)
WBC: 4.2 10*3/uL (ref 4.0–10.5)

## 2018-05-06 LAB — CMP (CANCER CENTER ONLY)
ALBUMIN: 3.7 g/dL (ref 3.5–5.0)
ALT: 19 U/L (ref 0–44)
AST: 28 U/L (ref 15–41)
Alkaline Phosphatase: 74 U/L (ref 38–126)
Anion gap: 9 (ref 5–15)
BUN: 25 mg/dL — ABNORMAL HIGH (ref 8–23)
CHLORIDE: 105 mmol/L (ref 98–111)
CO2: 25 mmol/L (ref 22–32)
CREATININE: 1.57 mg/dL — AB (ref 0.61–1.24)
Calcium: 9.7 mg/dL (ref 8.9–10.3)
GFR, EST AFRICAN AMERICAN: 50 mL/min — AB (ref >60)
GFR, EST NON AFRICAN AMERICAN: 43 mL/min — AB (ref >60)
Glucose, Bld: 120 mg/dL — ABNORMAL HIGH (ref 70–99)
POTASSIUM: 4 mmol/L (ref 3.5–5.1)
SODIUM: 139 mmol/L (ref 135–145)
Total Bilirubin: 0.5 mg/dL (ref 0.3–1.2)
Total Protein: 7.1 g/dL (ref 6.5–8.1)

## 2018-05-06 MED FILL — NOVOLOG MIX 70-30 FLEXPEN S: (70-30) 100 | 32 days supply | Qty: 6 | Fill #6

## 2018-05-06 MED FILL — ACCU-CHEK AVIVA PLUS TEST S: 30 days supply | Qty: 100 | Fill #8

## 2018-05-06 MED FILL — AMLODIPINE BESYLATE 5 MG TA: 5 | 90 days supply | Qty: 90 | Fill #1

## 2018-05-06 MED FILL — PRAVASTATIN SODIUM 20 MG TA: 20 | 90 days supply | Qty: 90 | Fill #2

## 2018-05-06 NOTE — Progress Notes (Signed)
HEMATOLOGY/ONCOLOGY CLINIC NOTE  Date of Service: 05/06/2018  Patient Care Team: [name], MD as PCP - General (Internal Medicine) [name], MD as Consulting Physician (Hematology and Oncology)  CHIEF COMPLAINTS/PURPOSE OF CONSULTATION:  F/u for continued mx of Multiple Myeloma  Oncologic History:  72 y.o. male with diagnosis of IgA lambda active multiple myeloma, ISS Stage I. Active disease diagnosed based on presence of anemia, kidney insufficiency, and paraproteinemia with significant predominance of lambda light chains as well as significant elevation of IgA. Bone marrow biopsy confirmed presence of atypical monoclonal plasma cell process in the bone marrow comprising at least 7% of the cellularity. Based on the findings, patient was started on treatment with lenalidomide, bortezomib, and low-dose dexamethasone based on the anticipated tolerance by the patient. Lenalidomide was started at 10 mg daily based on creatinine clearance of 42, dexamethasone dose was reduced to 20 mg weekly based on patient's age. Treatment was complicated by rapid development of cutaneous rash attributed to lenalidomide, inaddition to decreased renal function and now patient has persistent severe anemia. Side-effects were attributed to Lenalidomide and lenalidomide was discontinued with subsequent recovery.  Patient has been receiving bortezomib weekly with low-dose dexamethasone in 4-day cycles.  Patient has completed induction systemic therapy for his disease with repeat bone marrow biopsy confirming complete response including no evidence of minimal residual disease by cytogenetics or FISH.  HISTORY OF PRESENTING ILLNESS:   Robert Sparks is a wonderful 72 y.o. male who has been referred to [place] by my colleague Dr Grace Isaac for evaluation and management of Multiple Myeloma. The pt reports that he is doing well overall.   The pt has been previously followed by my colleague Dr Grace Isaac. The pt was treated with Revlimid, Velcade, and dexamethasone and was subsequently switched to Velcade and Dexamethasone after a Revlimid intolerance.   The pt reports some knee aches and skin soreness in his legs which is worse at night. He notes that he has had diabetic-related peripheral neuropathy. The pt has not seen [facility] yet for a discussion of a BM transplant. He continues taking Acyclovir and notes no difficulty taking maintenance Velcade.   He adds that he takes Gabapentin and sweats.  He notes that his neuropathy is currently stable and denies it worsening.   He notes that a pack of cigarettes lasts him for about 3 days.   Most recent lab results (12/14/17) of CBC and CMP  is as follows: all values are WNL except for RBC at 3.08, HGB at 10.1, HCT at 31.0, MCV at 100.6, Eosinophils Abs at 1.4k, Potassium at 3.4, Glucose at 156, Creatinine at 1.60. LDH 12/14/17 is 141 Beta 2 microglobulin 12/14/17 is 2.4  On review of systems, pt reports knee pain, night time LE skin soreness, peripheral neuropathy, eating well, strong appetite, intermittent sweating and denies fevers, chills, back pains, abdominal pains, and any other symptoms.   [name] DM type 2, HTN, CKD. On Social Hx the pt reports smoking a pack of cigarettes every 3 days.   [name] returns today regarding management and evaluation of his Multiple Myeloma. [name] was on 04/08/18. The pt reports that he is doing well overall.   The pt was admitted for two days in the interim until 05/04/18. The pt reports that he began feeling acutely weak, and had a high fever over 101 degrees farenheit, and was admitted for  an infection of unclear source. He notes that he wasn't exposed to individuals with infections. He notes that since discharge he has not felt any symptoms and denies any current concerns of infection.   The pt notes that he hasn't had any problems  taking Ninlaro in general. He notes that the finger tips of his right hand occasionally feels numb at night and denies any other extremities with numbness or tingling.   The pt continues on PO iron replacement and laxatives for his occasional constipation.   Lab results today (05/06/18) of CBC w/diff, CMP is as follows: all values are WNL except for RBC at 2.96, HGB at 9.6, HCT at 30.0, MCV at 101.4, Eosinophils abs at 900, Glucose at 120, BUN at 25, Creatinine at 1.57, GFR at 50. 05/06/18 MMP is pending  On review of systems, pt reports stable energy levels, and denies fevers, chills, night sweats, runny nose, mouth sores, concerns of infections, discomfort passing urine, abdominal pains, leg swelling, and any other symptoms.   MEDICAL HISTORY:  Past Medical History:  Diagnosis Date  . Anemia   . Arthritis    shoulders,feet   . Cataract    per eye dr -has appt 5-11  . CKD (chronic kidney disease), stage III ([place])   . Elevated PSA   . History of ketoacidosis    03-29-2015   . History of sepsis    07-25-2013  non-compliant w/ medication  . Hyperlipidemia   . Hypertension   . Nocturia   . Peripheral neuropathy   . Prostate cancer ([place])   . Type 2 diabetes mellitus with insulin therapy ([place])     SURGICAL HISTORY: Past Surgical History:  Procedure Laterality Date  . CIRCUMCISION  1990's  . PROSTATE BIOPSY N/A 12/21/2015   Procedure: BIOPSY TRANSRECTAL ULTRASONIC PROSTATE (TUBP);  Surgeon: Festus Aloe, MD;  Location: [facility];  Service: Urology;  Laterality: N/A;    SOCIAL HISTORY: Social History   Socioeconomic History  . Marital status: Single    Spouse name: Not on file  . Number of children: Not on file  . Years of education: Not on file  . Highest education level: Not on file  Occupational History  . Not on file  Social Needs  . Financial resource strain: Not on file  . Food insecurity:    Worry: Not on file    Inability: Not on file  .  Transportation needs:    Medical: Not on file    Non-medical: Not on file  Tobacco Use  . Smoking status: Current Every Day Smoker    Packs/day: 0.25    Years: 50.00    Pack years: 12.50    Types: Cigarettes  . Smokeless tobacco: Never Used  . Tobacco comment: 1 ppwk-4-5 a day   Substance and Sexual Activity  . Alcohol use: Yes    Alcohol/week: 1.0 standard drinks    Types: 1 Cans of beer per week    Comment: 1 every day-quit couple weeks ago  . Drug use: No  . Sexual activity: Not Currently  Lifestyle  . Physical activity:    Days per week: Not on file    Minutes per session: Not on file  . Stress: Not on file  Relationships  . Social connections:    Talks on phone: Not on file    Gets together: Not on file    Attends religious service: Not on file    Active member of club or organization: Not on  file    Attends meetings of clubs or organizations: Not on file    Relationship status: Not on file  . Intimate partner violence:    Fear of current or ex partner: Not on file    Emotionally abused: Not on file    Physically abused: Not on file    Forced sexual activity: Not on file  Other Topics Concern  . Not on file  Social History Narrative  . Not on file    FAMILY HISTORY: Family History  Problem Relation Age of Onset  . Cancer Neg Hx   . Colon cancer Neg Hx   . Colon polyps Neg Hx   . Esophageal cancer Neg Hx   . Rectal cancer Neg Hx   . Stomach cancer Neg Hx     ALLERGIES:  has No Known Allergies.  MEDICATIONS:  Current Outpatient Medications  Medication Sig Dispense Refill  . ACCU-CHEK AVIVA PLUS test strip 1 each by Other route See admin instructions. 100 each 12  . acyclovir (ZOVIRAX) 400 MG tablet Take 1 tablet (400 mg total) by mouth 2 (two) times daily. 60 tablet 5  . amLODipine (NORVASC) 5 MG tablet Take 1 tablet (5 mg total) by mouth daily. 90 tablet 3  . aspirin EC 81 MG tablet Take 1 tablet (81 mg total) by mouth daily. 90 tablet 3  .  Cholecalciferol (VITAMIN D) 2000 units CAPS Take 2,000 Units by mouth daily.    . diclofenac sodium (VOLTAREN) 1 % GEL Apply 2 g topically 4 (four) times daily. (Patient taking differently: Apply 2 g topically 4 (four) times daily as needed (pain). ) 100 g 2  . DULoxetine (CYMBALTA) 20 MG capsule Take 1 capsule (20 mg total) by mouth daily. 30 capsule 3  . ferrous sulfate 325 (65 FE) MG tablet Take 325 mg by mouth daily.    [name] gabapentin (NEURONTIN) 300 MG capsule TAKE 1 CAPSULE BY MOUTH IN THE MORNING AND AT LUNCH, TAKE 2 CAPSULES BY MOUTH AT NIGHT. (Patient taking differently: Take 300-600 mg by mouth See admin instructions. Take 300 mg by mouth in the morning, 300 mg by mouth at lunch and 600 mg by mouth at bedtime) 120 capsule 4  . insulin aspart protamine - aspart (NOVOLOG 70/30 MIX) (70-30) 100 UNIT/ML FlexPen Inject 0.07 mLs (7 Units total) into the skin 2 (two) times daily with a meal. 15 mL 6  . Insulin Pen Needle (TRUEPLUS PEN NEEDLES) 32G X 4 MM MISC Use as directed twice daily with insulin 100 each 3  . ixazomib citrate (NINLARO) 3 MG capsule Take 1 capsule by mouth on Day 1, Day 8 & D15 of each 28d cycle. Take on empty stomach 1hr before or 2hrs after food. Do not crush,chew,open (Patient taking differently: Take 3 mg by mouth See admin instructions. Take 3 mg by mouth on Day 1, Day 8 & Day 15 of each 28d cycle. Take on empty stomach 1hr before or 2hrs after food. Do not crush, chew or open) 3 capsule 5  . Multiple Vitamin (MULTIVITAMIN WITH MINERALS) TABS tablet Take 1 tablet by mouth daily. 60 tablet 2  . pravastatin (PRAVACHOL) 20 MG tablet Take 1 tablet (20 mg total) by mouth daily. 90 tablet 3  . tadalafil (ADCIRCA/CIALIS) 20 MG tablet Take 20 mg by mouth daily as needed for erectile dysfunction.   0  . triamcinolone lotion (KENALOG) 0.1 % Apply 1 application topically 3 (three) times daily as needed. (Patient taking differently: Apply 1  application topically 3 (three) times daily as  needed (irritation). ) 60 mL 2  . vitamin B-12 (CYANOCOBALAMIN) 100 MCG tablet Take 100 mcg by mouth daily.     Current Facility-Administered Medications  Medication Dose Route Frequency Provider Last Rate Last Dose  . 0.9 %  sodium chloride infusion  500 mL Intravenous Continuous Pyrtle, Lajuan Lines, MD        REVIEW OF SYSTEMS:    A 10+ POINT REVIEW OF SYSTEMS WAS OBTAINED including neurology, dermatology, psychiatry, cardiac, respiratory, lymph, extremities, GI, GU, Musculoskeletal, constitutional, breasts, reproductive, HEENT.  All pertinent positives are noted in the HPI.  All others are negative.   PHYSICAL EXAMINATION: ECOG PERFORMANCE STATUS: 1 - Symptomatic but completely ambulatory  GENERAL:alert, in no acute distress and comfortable SKIN: no acute rashes, no significant lesions EYES: conjunctiva are pink and non-injected, sclera anicteric OROPHARYNX: MMM, no exudates, no oropharyngeal erythema or ulceration NECK: supple, no JVD LYMPH:  no palpable lymphadenopathy in the cervical, axillary or inguinal regions LUNGS: clear to auscultation b/l with normal respiratory effort HEART: regular rate & rhythm ABDOMEN:  normoactive bowel sounds , non tender, not distended. No palpable hepatosplenomegaly.  Extremity: no pedal edema PSYCH: alert & oriented x 3 with fluent speech NEURO: no focal motor/sensory deficits   LABORATORY DATA:  I have reviewed the data as listed  . CBC Latest Ref Rng & Units 05/06/2018 05/04/2018 05/03/2018  WBC 4.0 - 10.5 K/uL 4.2 3.4(L) 2.9(L)  Hemoglobin 13.0 - 17.0 g/dL 9.6(L) 9.8(L) 9.1(L)  Hematocrit 39.0 - 52.0 % 30.0(L) 30.6(L) 29.2(L)  Platelets 150 - 400 K/uL 188 151 144(L)    . CMP Latest Ref Rng & Units 05/06/2018 05/04/2018 05/03/2018  Glucose 70 - 99 mg/dL 120(H) 159(H) 118(H)  BUN 8 - 23 mg/dL 25(H) 20 21  Creatinine 0.61 - 1.24 mg/dL 1.57(H) 1.21 1.13  Sodium 135 - 145 mmol/L 139 134(L) 136  Potassium 3.5 - 5.1 mmol/L 4.0 3.7 3.9    Chloride 98 - 111 mmol/L 105 104 107  CO2 22 - 32 mmol/L '25 23 22  ' Calcium 8.9 - 10.3 mg/dL 9.7 8.9 8.6(L)  Total Protein 6.5 - 8.1 g/dL 7.1 - 6.2(L)  Total Bilirubin 0.3 - 1.2 mg/dL 0.5 - 0.9  Alkaline Phos 38 - 126 U/L 74 - 37(L)  AST 15 - 41 U/L 28 - 17  ALT 0 - 44 U/L 19 - 11   08/29/17 BM Bx:    08/29/17 Cytogenetics:   03/13/17 Cytogenetics:          RADIOGRAPHIC STUDIES: I have personally reviewed the radiological images as listed and agreed with the findings in the report. Dg Chest 1 View  Result Date: 05/03/2018 CLINICAL DATA:  Cough. EXAM: CHEST  1 VIEW COMPARISON:  05/02/2018 FINDINGS: Lungs are adequately inflated and otherwise clear. Cardiomediastinal silhouette and remainder of the exam is unchanged. IMPRESSION: No active disease. Electronically Signed   By: Marin Olp M.D.   On: 05/03/2018 09:49   Dg Chest 2 View  Result Date: 05/02/2018 CLINICAL DATA:  Cough for 1 week EXAM: CHEST - 2 VIEW COMPARISON:  None. FINDINGS: The heart size and mediastinal contours are within normal limits. Both lungs are clear. The visualized skeletal structures are unremarkable. IMPRESSION: No active cardiopulmonary disease. Electronically Signed   By: Inez Catalina M.D.   On: 05/02/2018 12:11    ASSESSMENT & PLAN:   72 y.o. male with   1. IgA Lambda Multiple Myeloma ISS Stage I -Currently in remission  Active disease was previously diagnosed based on presence of anemia, kidney insufficiency, and paraproteinemia with significant predominance of lambda light chains as well as significant elevation of IgA.  PLAN:  -Recommend PCP rule out Peripheral arterial disease with [place] with ABIs given leg cramping -Continue Acyclovir -Recommended that the pt begin a Vitamin B complex -Discussed pt labwork today, 05/06/18; blood counts and chemistries are stable, no neutropenia with ANC at 2.1k -The pt has no prohibitive toxicities from continuing maintenance Ninlaro at this time.    -Recommended that the pt continue to eat well, drink at least 48-64 oz of water each day, and walk 20-30 minutes each day.   -Will hold Xgeva at this time with dental work, will reassess in one month  -Will see the pt back in one month given recent admission for infection. These symptoms have subsided and pt denies current concern for infection.     RTC with Dr Irene Limbo with labs in 5 weeks     All of the patients questions were answered with apparent satisfaction. The patient knows to call the clinic with any problems, questions or concerns.  The total time spent in the appt was 25 minutes and more than 50% was on counseling and direct patient cares.     Sullivan Lone MD MS AAHIVMS [facility] Hematology/Oncology Physician [facility]  (Office):       [phone] (Work cell):  [phone] (Fax):           [phone]  05/06/2018 10:09 AM  I, Baldwin [place], am acting as a scribe for Dr. Irene Limbo  .I have reviewed the above documentation for accuracy and completeness, and I agree with the above. Brunetta Genera MD

## 2018-05-07 ENCOUNTER — Other Ambulatory Visit: Payer: Self-pay | Admitting: *Deleted

## 2018-05-07 DIAGNOSIS — C9000 Multiple myeloma not having achieved remission: Secondary | ICD-10-CM

## 2018-05-07 LAB — CULTURE, BLOOD (ROUTINE X 2)
CULTURE: NO GROWTH
Culture: NO GROWTH
Special Requests: ADEQUATE
Special Requests: ADEQUATE

## 2018-05-07 MED ORDER — IXAZOMIB CITRATE 3 MG PO CAPS: ORAL_CAPSULE | ORAL | 5 refills | Status: DC

## 2018-05-08 ENCOUNTER — Ambulatory Visit (INDEPENDENT_AMBULATORY_CARE_PROVIDER_SITE_OTHER): Payer: Medicare HMO | Admitting: Podiatry

## 2018-05-08 DIAGNOSIS — M79674 Pain in right toe(s): Secondary | ICD-10-CM | POA: Diagnosis not present

## 2018-05-08 DIAGNOSIS — L84 Corns and callosities: Secondary | ICD-10-CM

## 2018-05-08 DIAGNOSIS — E1142 Type 2 diabetes mellitus with diabetic polyneuropathy: Secondary | ICD-10-CM

## 2018-05-08 DIAGNOSIS — M79675 Pain in left toe(s): Secondary | ICD-10-CM | POA: Diagnosis not present

## 2018-05-08 DIAGNOSIS — B351 Tinea unguium: Secondary | ICD-10-CM | POA: Diagnosis not present

## 2018-05-08 MED FILL — NINLARO 3 MG CAPSULE: 3 | 28 days supply | Qty: 3 | Fill #1

## 2018-05-08 NOTE — Patient Instructions (Signed)
Ingrown Toenail An ingrown toenail occurs when the corner or sides of your toenail grow into the surrounding skin. The big toe is most commonly affected, but it can happen to any of your toes. If your ingrown toenail is not treated, you will be at risk for infection. What are the causes? This condition may be caused by:  Wearing shoes that are too small or tight.  Injury or trauma, such as stubbing your toe or having your toe stepped on.  Improper cutting or care of your toenails.  Being born with (congenital) nail or foot abnormalities, such as having a nail that is too big for your toe.  What increases the risk? Risk factors for an ingrown toenail include:  Age. Your nails tend to thicken as you get older, so ingrown nails are more common in older people.  Diabetes.  Cutting your toenails incorrectly.  Blood circulation problems.  What are the signs or symptoms? Symptoms may include:  Pain, soreness, or tenderness.  Redness.  Swelling.  Hardening of the skin surrounding the toe.  Your ingrown toenail may be infected if there is fluid, pus, or drainage. How is this diagnosed? An ingrown toenail may be diagnosed by medical history and physical exam. If your toenail is infected, your health care provider may test a sample of the drainage. How is this treated? Treatment depends on the severity of your ingrown toenail. Some ingrown toenails may be treated at home. More severe or infected ingrown toenails may require surgery to remove all or part of the nail. Infected ingrown toenails may also be treated with antibiotic medicines. Follow these instructions at home:  If you were prescribed an antibiotic medicine, finish all of it even if you start to feel better.  Soak your foot in warm soapy water for 20 minutes, 3 times per day or as directed by your health care provider.  Carefully lift the edge of the nail away from the sore skin by wedging a small piece of cotton under  the corner of the nail. This may help with the pain. Be careful not to cause more injury to the area.  Wear shoes that fit well. If your ingrown toenail is causing you pain, try wearing sandals, if possible.  Trim your toenails regularly and carefully. Do not cut them in a curved shape. Cut your toenails straight across. This prevents injury to the skin at the corners of the toenail.  Keep your feet clean and dry.  If you are having trouble walking and are given crutches by your health care provider, use them as directed.  Do not pick at your toenail or try to remove it yourself.  Take medicines only as directed by your health care provider.  Keep all follow-up visits as directed by your health care provider. This is important. Contact a health care provider if:  Your symptoms do not improve with treatment. Get help right away if:  You have red streaks that start at your foot and go up your leg.  You have a fever.  You have increased redness, swelling, or pain.  You have fluid, blood, or pus coming from your toenail. This information is not intended to replace advice given to you by your health care provider. Make sure you discuss any questions you have with your health care provider. Document Released: 06/23/2000 Document Revised: 11/26/2015 Document Reviewed: 05/20/2014 Elsevier Interactive Patient Education  2018 [employer] Inc[employer]  

## 2018-05-09 ENCOUNTER — Other Ambulatory Visit: Payer: Self-pay | Admitting: Internal Medicine

## 2018-05-09 DIAGNOSIS — M1711 Unilateral primary osteoarthritis, right knee: Secondary | ICD-10-CM

## 2018-05-09 DIAGNOSIS — E1142 Type 2 diabetes mellitus with diabetic polyneuropathy: Secondary | ICD-10-CM

## 2018-05-09 LAB — MULTIPLE MYELOMA PANEL, SERUM
ALBUMIN SERPL ELPH-MCNC: 3.8 g/dL (ref 2.9–4.4)
ALPHA 1: 0.2 g/dL (ref 0.0–0.4)
Albumin/Glob SerPl: 1.5 (ref 0.7–1.7)
Alpha2 Glob SerPl Elph-Mcnc: 0.8 g/dL (ref 0.4–1.0)
B-Globulin SerPl Elph-Mcnc: 0.8 g/dL (ref 0.7–1.3)
GLOBULIN, TOTAL: 2.7 g/dL (ref 2.2–3.9)
Gamma Glob SerPl Elph-Mcnc: 0.8 g/dL (ref 0.4–1.8)
IGA: 114 mg/dL (ref 61–437)
IgG (Immunoglobin G), Serum: 865 mg/dL (ref 700–1600)
IgM (Immunoglobulin M), Srm: 63 mg/dL (ref 20–172)
Total Protein ELP: 6.5 g/dL (ref 6.0–8.5)

## 2018-05-10 MED FILL — DULoxetine HCL 20 MG CPEP: 20 | 30 days supply | Qty: 30 | Fill #0

## 2018-05-26 ENCOUNTER — Encounter: Payer: Self-pay | Admitting: Podiatry

## 2018-05-26 NOTE — Progress Notes (Addendum)
Subjective: Robert Sparks presents for preventative foot care. He has h/o diabetes and diabetic neuropathy. He reports painful, mycotic toenails.  Pain is aggravated when wearing enclosed shoe gear and relieved with periodic professional debridement.  Patient has peripheral neuropathy managed with gabapentin.  Objective:  Vascular Examination: Capillary refill time immediat x 10 Dorsalis pedis and Posterior tibial pulses palpable b/l Digital hair x 10 digits was absent Skin temperature gradient WNL b/l  Dermatological Examination: Skin with normal turgor, texture and tone b/l   Toenails 1-5 b/l discolored, thick, dystrophic with subungual debris and pain with palpation to nailbeds due to thickness of nails.  Hyperkeratotic lesion subhallux IPJ b/l with tenderness to palpation.    Musculoskeletal: Muscle strength 5/5 b/l to all muscle groups b/l  Neurological: Sensation with 10 gram monofilament diminished b/l Vibratory sensation diminished b/l  Assessment: 1. Painful onychomycosis toenails 1-5 b/l 2. Callus b/l hallux 3. NIDDM with peripheral neuropathy  Plan: 1. Toenails 1-5 b/l were debrided in length and girth without iatrogenic bleeding. 2. Callus pared b/l hallux  3. Patient to continue soft, supportive shoe gear 4. Patient to report any pedal injuries to medical professional  5. Follow up 3 months. Patient/POA to call should there be a concern in the interim.

## 2018-05-27 MED FILL — TRUEPLUS PEN NDL 32GX5/32: 32G X 4 MM | 50 days supply | Qty: 100 | Fill #1

## 2018-05-27 MED FILL — TRUEPLUS PEN NDL 32GX5/32": 32G X 4 MM | 50 days supply | Qty: 100 | Fill #1

## 2018-05-27 MED FILL — GABAPENTIN 300 MG CAPSULE: 300 | 30 days supply | Qty: 120 | Fill #2

## 2018-05-29 MED FILL — ACCU-CHEK AVIVA PLUS TEST S: 30 days supply | Qty: 100 | Fill #9

## 2018-05-31 ENCOUNTER — Telehealth: Payer: Self-pay | Admitting: *Deleted

## 2018-05-31 NOTE — Telephone Encounter (Signed)
Patient called to report increased tingling and numbness in right arm. He states that when he reported it to MD at last visit, Dr. Irene Limbo requested he contact office if it continued so he is doing that.

## 2018-06-04 MED FILL — NINLARO 3 MG CAPSULE: 3 | 28 days supply | Qty: 3 | Fill #0

## 2018-06-10 ENCOUNTER — Encounter: Payer: Self-pay | Admitting: Hematology

## 2018-06-10 ENCOUNTER — Inpatient Hospital Stay (HOSPITAL_BASED_OUTPATIENT_CLINIC_OR_DEPARTMENT_OTHER): Payer: Medicare HMO | Admitting: Hematology

## 2018-06-10 ENCOUNTER — Inpatient Hospital Stay: Payer: Medicare HMO | Attending: Hematology and Oncology

## 2018-06-10 ENCOUNTER — Telehealth: Payer: Self-pay

## 2018-06-10 VITALS — BP 132/83 | HR 80 | Temp 98.1°F | Resp 18 | Ht 64.0 in | Wt 156.9 lb

## 2018-06-10 DIAGNOSIS — E1122 Type 2 diabetes mellitus with diabetic chronic kidney disease: Secondary | ICD-10-CM | POA: Insufficient documentation

## 2018-06-10 DIAGNOSIS — C9001 Multiple myeloma in remission: Secondary | ICD-10-CM

## 2018-06-10 DIAGNOSIS — D649 Anemia, unspecified: Secondary | ICD-10-CM | POA: Diagnosis not present

## 2018-06-10 DIAGNOSIS — N183 Chronic kidney disease, stage 3 (moderate): Secondary | ICD-10-CM | POA: Insufficient documentation

## 2018-06-10 DIAGNOSIS — I129 Hypertensive chronic kidney disease with stage 1 through stage 4 chronic kidney disease, or unspecified chronic kidney disease: Secondary | ICD-10-CM | POA: Insufficient documentation

## 2018-06-10 DIAGNOSIS — E1140 Type 2 diabetes mellitus with diabetic neuropathy, unspecified: Secondary | ICD-10-CM

## 2018-06-10 LAB — CMP (CANCER CENTER ONLY)
ALK PHOS: 94 U/L (ref 38–126)
ALT: 16 U/L (ref 0–44)
ANION GAP: 10 (ref 5–15)
AST: 22 U/L (ref 15–41)
Albumin: 3.9 g/dL (ref 3.5–5.0)
BILIRUBIN TOTAL: 0.4 mg/dL (ref 0.3–1.2)
BUN: 31 mg/dL — ABNORMAL HIGH (ref 8–23)
CALCIUM: 9.4 mg/dL (ref 8.9–10.3)
CO2: 25 mmol/L (ref 22–32)
Chloride: 104 mmol/L (ref 98–111)
Creatinine: 1.6 mg/dL — ABNORMAL HIGH (ref 0.61–1.24)
GFR, EST NON AFRICAN AMERICAN: 43 mL/min — AB (ref >60)
GFR, Est AFR Am: 50 mL/min — ABNORMAL LOW (ref >60)
Glucose, Bld: 110 mg/dL — ABNORMAL HIGH (ref 70–99)
Potassium: 4.2 mmol/L (ref 3.5–5.1)
Sodium: 139 mmol/L (ref 135–145)
TOTAL PROTEIN: 7.1 g/dL (ref 6.5–8.1)

## 2018-06-10 LAB — CBC WITH DIFFERENTIAL/PLATELET
Abs Immature Granulocytes: 0.01 10*3/uL (ref 0.00–0.07)
BASOS PCT: 0 %
Basophils Absolute: 0 10*3/uL (ref 0.0–0.1)
EOS ABS: 1.2 10*3/uL — AB (ref 0.0–0.5)
Eosinophils Relative: 23 %
HEMATOCRIT: 31.3 % — AB (ref 39.0–52.0)
HEMOGLOBIN: 10.3 g/dL — AB (ref 13.0–17.0)
Immature Granulocytes: 0 %
Lymphocytes Relative: 16 %
Lymphs Abs: 0.8 10*3/uL (ref 0.7–4.0)
MCH: 33 pg (ref 26.0–34.0)
MCHC: 32.9 g/dL (ref 30.0–36.0)
MCV: 100.3 fL — ABNORMAL HIGH (ref 80.0–100.0)
MONOS PCT: 10 %
Monocytes Absolute: 0.5 10*3/uL (ref 0.1–1.0)
NEUTROS PCT: 51 %
NRBC: 0 % (ref 0.0–0.2)
Neutro Abs: 2.7 10*3/uL (ref 1.7–7.7)
Platelets: 258 10*3/uL (ref 150–400)
RBC: 3.12 MIL/uL — ABNORMAL LOW (ref 4.22–5.81)
RDW: 13.7 % (ref 11.5–15.5)
WBC: 5.3 10*3/uL (ref 4.0–10.5)

## 2018-06-10 NOTE — Progress Notes (Signed)
HEMATOLOGY/ONCOLOGY CLINIC NOTE  Date of Service: 06/10/2018  Patient Care Team: [name], MD as PCP - General (Internal Medicine) [name], MD as Consulting Physician (Hematology and Oncology)  CHIEF COMPLAINTS/PURPOSE OF CONSULTATION:  F/u for continued mx of Multiple Myeloma  Oncologic History:  72 y.o. male with diagnosis of IgA lambda active multiple myeloma, ISS Stage I. Active disease diagnosed based on presence of anemia, kidney insufficiency, and paraproteinemia with significant predominance of lambda light chains as well as significant elevation of IgA. Bone marrow biopsy confirmed presence of atypical monoclonal plasma cell process in the bone marrow comprising at least 7% of the cellularity. Based on the findings, patient was started on treatment with lenalidomide, bortezomib, and low-dose dexamethasone based on the anticipated tolerance by the patient. Lenalidomide was started at 10 mg daily based on creatinine clearance of 42, dexamethasone dose was reduced to 20 mg weekly based on patient's age. Treatment was complicated by rapid development of cutaneous rash attributed to lenalidomide, inaddition to decreased renal function and now patient has persistent severe anemia. Side-effects were attributed to Lenalidomide and lenalidomide was discontinued with subsequent recovery.  Patient has been receiving bortezomib weekly with low-dose dexamethasone in 4-day cycles.  Patient has completed induction systemic therapy for his disease with repeat bone marrow biopsy confirming complete response including no evidence of minimal residual disease by cytogenetics or FISH.  HISTORY OF PRESENTING ILLNESS:   Robert Sparks is a wonderful 73 y.o. male who has been referred to [place] by my colleague Dr Grace Isaac for evaluation and management of Multiple Myeloma. The pt reports that he is doing well overall.   The pt has been previously followed by my colleague Dr Grace Isaac. The pt was treated with Revlimid, Velcade, and dexamethasone and was subsequently switched to Velcade and Dexamethasone after a Revlimid intolerance.   The pt reports some knee aches and skin soreness in his legs which is worse at night. He notes that he has had diabetic-related peripheral neuropathy. The pt has not seen [facility] yet for a discussion of a BM transplant. He continues taking Acyclovir and notes no difficulty taking maintenance Velcade.   He adds that he takes Gabapentin and sweats.  He notes that his neuropathy is currently stable and denies it worsening.   He notes that a pack of cigarettes lasts him for about 3 days.   Most recent lab results (12/14/17) of CBC and CMP  is as follows: all values are WNL except for RBC at 3.08, HGB at 10.1, HCT at 31.0, MCV at 100.6, Eosinophils Abs at 1.4k, Potassium at 3.4, Glucose at 156, Creatinine at 1.60. LDH 12/14/17 is 141 Beta 2 microglobulin 12/14/17 is 2.4  On review of systems, pt reports knee pain, night time LE skin soreness, peripheral neuropathy, eating well, strong appetite, intermittent sweating and denies fevers, chills, back pains, abdominal pains, and any other symptoms.   [name] DM type 2, HTN, CKD. On Social Hx the pt reports smoking a pack of cigarettes every 3 days.   Interval History:   [name] returns today regarding management and evaluation of his Multiple Myeloma. [name] was on 05/06/18. The pt reports that he is doing well overall.   The pt reports that the tingling and numbness in his fingertips persists, but has not worsened. The pt continues to take a multivitamin and Vitamin B12. The pt also denies any new bone pains.  The pt notes that he continues eating well and he has been trying to stay active. He notes that he has gained some weight as well.   Lab results today (06/10/18) of CBC w/diff is as follows: all values are WNL except for RBC at 3.12, HGB  at 10.3, HCT at 31.3, MCV at 100.3, Eosinophils abs at 1.2k. 06/10/18 CMP is pending 06/10/18 SFLC and MMP are pending  On review of systems, pt reports persisting tingling and numbness in fingertips, stable energy levels, eating well, gaining weight, and denies worsening neuropathy, abdominal pains, mouth sores, new bone pains, leg swelling, and any other symptoms.   MEDICAL HISTORY:  Past Medical History:  Diagnosis Date  . Anemia   . Arthritis    shoulders,feet   . Cataract    per eye dr -has appt 5-11  . CKD (chronic kidney disease), stage III ([place])   . Elevated PSA   . History of ketoacidosis    03-29-2015   . History of sepsis    07-25-2013  non-compliant w/ medication  . Hyperlipidemia   . Hypertension   . Nocturia   . Peripheral neuropathy   . Prostate cancer ([place])   . Type 2 diabetes mellitus with insulin therapy ([place])     SURGICAL HISTORY: Past Surgical History:  Procedure Laterality Date  . CIRCUMCISION  1990's  . PROSTATE BIOPSY N/A 12/21/2015   Procedure: BIOPSY TRANSRECTAL ULTRASONIC PROSTATE (TUBP);  Surgeon: Festus Aloe, MD;  Location: [facility];  Service: Urology;  Laterality: N/A;    SOCIAL HISTORY: Social History   Socioeconomic History  . Marital status: Single    Spouse name: Not on file  . Number of children: Not on file  . Years of education: Not on file  . Highest education level: Not on file  Occupational History  . Not on file  Social Needs  . Financial resource strain: Not on file  . Food insecurity:    Worry: Not on file    Inability: Not on file  . Transportation needs:    Medical: Not on file    Non-medical: Not on file  Tobacco Use  . Smoking status: Current Every Day Smoker    Packs/day: 0.25    Years: 50.00    Pack years: 12.50    Types: Cigarettes  . Smokeless tobacco: Never Used  . Tobacco comment: 1 ppwk-4-5 a day   Substance and Sexual Activity  . Alcohol use: Yes    Alcohol/week: 1.0 standard  drinks    Types: 1 Cans of beer per week    Comment: 1 every day-quit couple weeks ago  . Drug use: No  . Sexual activity: Not Currently  Lifestyle  . Physical activity:    Days per week: Not on file    Minutes per session: Not on file  . Stress: Not on file  Relationships  . Social connections:    Talks on phone: Not on file    Gets together: Not on file    Attends religious service: Not on file    Active member of club or organization: Not on file    Attends meetings of clubs or organizations: Not on file    Relationship status: Not on file  . Intimate partner violence:    Fear of current or ex partner: Not on file    Emotionally abused: Not on file    Physically abused: Not on file    Forced sexual activity: Not on file  Other  Topics Concern  . Not on file  Social History Narrative  . Not on file    FAMILY HISTORY: Family History  Problem Relation Age of Onset  . Cancer Neg Hx   . Colon cancer Neg Hx   . Colon polyps Neg Hx   . Esophageal cancer Neg Hx   . Rectal cancer Neg Hx   . Stomach cancer Neg Hx     ALLERGIES:  has No Known Allergies.  MEDICATIONS:  Current Outpatient Medications  Medication Sig Dispense Refill  . ACCU-CHEK AVIVA PLUS test strip 1 each by Other route See admin instructions. 100 each 12  . acyclovir (ZOVIRAX) 400 MG tablet Take 1 tablet (400 mg total) by mouth 2 (two) times daily. 60 tablet 5  . amLODipine (NORVASC) 5 MG tablet Take 1 tablet (5 mg total) by mouth daily. 90 tablet 3  . aspirin EC 81 MG tablet Take 1 tablet (81 mg total) by mouth daily. 90 tablet 3  . Cholecalciferol (VITAMIN D) 2000 units CAPS Take 2,000 Units by mouth daily.    . diclofenac sodium (VOLTAREN) 1 % GEL Apply 2 g topically 4 (four) times daily. (Patient taking differently: Apply 2 g topically 4 (four) times daily as needed (pain). ) 100 g 2  . DULoxetine (CYMBALTA) 20 MG capsule TAKE 1 CAPSULE BY MOUTH DAILY. 30 capsule 2  . ferrous sulfate 325 (65 FE) MG  tablet Take 325 mg by mouth daily.    [name] gabapentin (NEURONTIN) 300 MG capsule TAKE 1 CAPSULE BY MOUTH IN THE MORNING AND AT LUNCH, TAKE 2 CAPSULES BY MOUTH AT NIGHT. (Patient taking differently: Take 300-600 mg by mouth See admin instructions. Take 300 mg by mouth in the morning, 300 mg by mouth at lunch and 600 mg by mouth at bedtime) 120 capsule 4  . insulin aspart protamine - aspart (NOVOLOG 70/30 MIX) (70-30) 100 UNIT/ML FlexPen Inject 0.07 mLs (7 Units total) into the skin 2 (two) times daily with a meal. 15 mL 6  . Insulin Pen Needle (TRUEPLUS PEN NEEDLES) 32G X 4 MM MISC Use as directed twice daily with insulin 100 each 3  . ixazomib citrate (NINLARO) 3 MG capsule Take 1 capsule by mouth on Day 1, Day 8 & D15 of each 28d cycle. Take on empty stomach 1hr before or 2hrs after food. Do not crush,chew,open 3 capsule 5  . Multiple Vitamin (MULTIVITAMIN WITH MINERALS) TABS tablet Take 1 tablet by mouth daily. 60 tablet 2  . pravastatin (PRAVACHOL) 20 MG tablet Take 1 tablet (20 mg total) by mouth daily. 90 tablet 3  . tadalafil (ADCIRCA/CIALIS) 20 MG tablet Take 20 mg by mouth daily as needed for erectile dysfunction.   0  . triamcinolone lotion (KENALOG) 0.1 % Apply 1 application topically 3 (three) times daily as needed. (Patient taking differently: Apply 1 application topically 3 (three) times daily as needed (irritation). ) 60 mL 2  . vitamin B-12 (CYANOCOBALAMIN) 100 MCG tablet Take 100 mcg by mouth daily.     Current Facility-Administered Medications  Medication Dose Route Frequency Provider Last Rate Last Dose  . 0.9 %  sodium chloride infusion  500 mL Intravenous Continuous Pyrtle, Lajuan Lines, MD        REVIEW OF SYSTEMS:    A 10+ POINT REVIEW OF SYSTEMS WAS OBTAINED including neurology, dermatology, psychiatry, cardiac, respiratory, lymph, extremities, GI, GU, Musculoskeletal, constitutional, breasts, reproductive, HEENT.  All pertinent positives are noted in the HPI.  All others are negative.  PHYSICAL EXAMINATION: ECOG PERFORMANCE STATUS: 1 - Symptomatic but completely ambulatory  GENERAL:alert, in no acute distress and comfortable SKIN: no acute rashes, no significant lesions EYES: conjunctiva are pink and non-injected, sclera anicteric OROPHARYNX: MMM, no exudates, no oropharyngeal erythema or ulceration NECK: supple, no JVD LYMPH:  no palpable lymphadenopathy in the cervical, axillary or inguinal regions LUNGS: clear to auscultation b/l with normal respiratory effort HEART: regular rate & rhythm ABDOMEN:  normoactive bowel sounds , non tender, not distended. No palpable hepatosplenomegaly.  Extremity: no pedal edema PSYCH: alert & oriented x 3 with fluent speech NEURO: no focal motor/sensory deficits   LABORATORY DATA:  I have reviewed the data as listed  . CBC Latest Ref Rng & Units 06/10/2018 05/06/2018 05/04/2018  WBC 4.0 - 10.5 K/uL 5.3 4.2 3.4(L)  Hemoglobin 13.0 - 17.0 g/dL 10.3(L) 9.6(L) 9.8(L)  Hematocrit 39.0 - 52.0 % 31.3(L) 30.0(L) 30.6(L)  Platelets 150 - 400 K/uL 258 188 151    . CMP Latest Ref Rng & Units 06/10/2018 05/06/2018 05/04/2018  Glucose 70 - 99 mg/dL 110(H) 120(H) 159(H)  BUN 8 - 23 mg/dL 31(H) 25(H) 20  Creatinine 0.61 - 1.24 mg/dL 1.60(H) 1.57(H) 1.21  Sodium 135 - 145 mmol/L 139 139 134(L)  Potassium 3.5 - 5.1 mmol/L 4.2 4.0 3.7  Chloride 98 - 111 mmol/L 104 105 104  CO2 22 - 32 mmol/L '25 25 23  ' Calcium 8.9 - 10.3 mg/dL 9.4 9.7 8.9  Total Protein 6.5 - 8.1 g/dL 7.1 7.1 -  Total Bilirubin 0.3 - 1.2 mg/dL 0.4 0.5 -  Alkaline Phos 38 - 126 U/L 94 74 -  AST 15 - 41 U/L 22 28 -  ALT 0 - 44 U/L 16 19 -   08/29/17 BM Bx:    08/29/17 Cytogenetics:   03/13/17 Cytogenetics:          RADIOGRAPHIC STUDIES: I have personally reviewed the radiological images as listed and agreed with the findings in the report. No results found.  ASSESSMENT & PLAN:   72 y.o. male with   1. IgA Lambda Multiple Myeloma ISS Stage I -Currently  in remission  Active disease was previously diagnosed based on presence of anemia, kidney insufficiency, and paraproteinemia with significant predominance of lambda light chains as well as significant elevation of IgA.  PLAN:  -Discussed pt labwork today, 06/10/18; HGB improved to 10.3, blood counts and chemistries are stable  -SPEP with no M spike suggesting continuing CR status -The pt has no prohibitive toxicities from continuing Ninlaro at this time.  -Recommend PCP rule out Peripheral arterial disease with [place] with ABIs given leg cramping -Continue Acyclovir -Recommended that the pt begin a Vitamin B complex -Will hold Xgeva at this time with dental work  -Will see the pt back in one month    RTC with Dr Irene Limbo with labs in 1 month   All of the patients questions were answered with apparent satisfaction. The patient knows to call the clinic with any problems, questions or concerns.  The total time spent in the appt was 20 minutes and more than 50% was on counseling and direct patient cares.     Sullivan Lone MD MS AAHIVMS [facility] Hematology/Oncology Physician [facility]  (Office):       [phone] (Work cell):  [phone] (Fax):           [phone]  06/10/2018 2:42 PM  I, Baldwin Jamaica, am acting as a scribe for Dr. Sullivan Lone.   Marland KitchenI  have reviewed the above documentation for accuracy and completeness, and I agree with the above. Brunetta Genera MD

## 2018-06-10 NOTE — Patient Instructions (Signed)
Thank you for choosing [facility] to provide your oncology and hematology care.  To afford each patient quality time with our providers, please arrive 30 minutes before your scheduled appointment time.  If you arrive late for your appointment, you may be asked to reschedule.  We strive to give you quality time with our providers, and arriving late affects you and other patients whose appointments are after yours.    If you are a no show for multiple scheduled visits, you may be dismissed from the clinic at the providers discretion.     Again, thank you for choosing [facility], our hope is that these requests will decrease the amount of time that you wait before being seen by our physicians.  ______________________________________________________________________   Should you have questions after your visit to the [facility], please contact our office at [phone] between the hours of 8:30 and 4:30 p.m.    Voicemails left after 4:30p.m will not be returned until the following business day.     For prescription refill requests, please have your pharmacy contact us directly.  Please also try to allow 48 hours for prescription requests.     Please contact the scheduling department for questions regarding scheduling.  For scheduling of procedures such as PET scans, CT scans, MRI, Ultrasound, etc please contact central scheduling at [phone].     Resources For Cancer Patients and Caregivers:    Oncolink.org:  A wonderful resource for patients and healthcare providers for information regarding your disease, ways to tract your treatment, what to expect, etc.      American Cancer Society:  800-227-2345  Can help patients locate various types of support and financial assistance   Cancer Care: 1-800-813-HOPE (4673) Provides financial assistance, online support groups, medication/co-pay assistance.     [place] DSS:  336-641-3447 Where to apply  for food stamps, Medicaid, and utility assistance   Medicare Rights Center: 800-333-4114 Helps people with Medicare understand their rights and benefits, navigate the Medicare system, and secure the quality healthcare they deserve   SCAT: 336-333-6589 [employer] [employer]'s shared-ride transportation service for eligible riders who have a disability that prevents them from riding the fixed route bus.     For additional information on assistance programs please contact our social worker:   Robert Sparks:  [phone]  

## 2018-06-10 NOTE — Telephone Encounter (Signed)
Printed avs and calender of upcoming appointment, per 12/2 los

## 2018-06-11 LAB — KAPPA/LAMBDA LIGHT CHAINS
KAPPA FREE LGHT CHN: 58.7 mg/L — AB (ref 3.3–19.4)
KAPPA, LAMDA LIGHT CHAIN RATIO: 2.41 — AB (ref 0.26–1.65)
Lambda free light chains: 24.4 mg/L (ref 5.7–26.3)

## 2018-06-12 LAB — MULTIPLE MYELOMA PANEL, SERUM
ALBUMIN SERPL ELPH-MCNC: 3.9 g/dL (ref 2.9–4.4)
ALBUMIN/GLOB SERPL: 1.6 (ref 0.7–1.7)
ALPHA 1: 0.2 g/dL (ref 0.0–0.4)
ALPHA2 GLOB SERPL ELPH-MCNC: 0.7 g/dL (ref 0.4–1.0)
B-Globulin SerPl Elph-Mcnc: 0.8 g/dL (ref 0.7–1.3)
Gamma Glob SerPl Elph-Mcnc: 0.9 g/dL (ref 0.4–1.8)
Globulin, Total: 2.6 g/dL (ref 2.2–3.9)
IGA: 121 mg/dL (ref 61–437)
IGM (IMMUNOGLOBULIN M), SRM: 67 mg/dL (ref 20–172)
IgG (Immunoglobin G), Serum: 959 mg/dL (ref 700–1600)
TOTAL PROTEIN ELP: 6.5 g/dL (ref 6.0–8.5)

## 2018-06-14 MED FILL — ACYCLOVIR 400 MG TABLET: 400 | 30 days supply | Qty: 60 | Fill #2

## 2018-06-14 MED FILL — DULoxetine HCL 20 MG CPEP: 20 | 30 days supply | Qty: 30 | Fill #1

## 2018-06-14 MED FILL — NOVOLOG MIX 70-30 FLEXPEN S: (70-30) 100 | 32 days supply | Qty: 6 | Fill #7

## 2018-06-20 ENCOUNTER — Ambulatory Visit: Payer: Medicare HMO | Attending: Internal Medicine | Admitting: Internal Medicine

## 2018-06-20 ENCOUNTER — Encounter: Payer: Self-pay | Admitting: Internal Medicine

## 2018-06-20 VITALS — BP 121/74 | HR 72 | Temp 97.7°F | Resp 16 | Wt 160.2 lb

## 2018-06-20 DIAGNOSIS — M170 Bilateral primary osteoarthritis of knee: Secondary | ICD-10-CM | POA: Diagnosis not present

## 2018-06-20 DIAGNOSIS — C9001 Multiple myeloma in remission: Secondary | ICD-10-CM | POA: Diagnosis not present

## 2018-06-20 DIAGNOSIS — N183 Chronic kidney disease, stage 3 (moderate): Secondary | ICD-10-CM

## 2018-06-20 DIAGNOSIS — F1010 Alcohol abuse, uncomplicated: Secondary | ICD-10-CM | POA: Insufficient documentation

## 2018-06-20 DIAGNOSIS — I10 Essential (primary) hypertension: Secondary | ICD-10-CM

## 2018-06-20 DIAGNOSIS — Z79899 Other long term (current) drug therapy: Secondary | ICD-10-CM | POA: Insufficient documentation

## 2018-06-20 DIAGNOSIS — M5136 Other intervertebral disc degeneration, lumbar region: Secondary | ICD-10-CM | POA: Insufficient documentation

## 2018-06-20 DIAGNOSIS — Z8546 Personal history of malignant neoplasm of prostate: Secondary | ICD-10-CM | POA: Insufficient documentation

## 2018-06-20 DIAGNOSIS — Z9889 Other specified postprocedural states: Secondary | ICD-10-CM | POA: Insufficient documentation

## 2018-06-20 DIAGNOSIS — Z794 Long term (current) use of insulin: Secondary | ICD-10-CM | POA: Insufficient documentation

## 2018-06-20 DIAGNOSIS — Z23 Encounter for immunization: Secondary | ICD-10-CM

## 2018-06-20 DIAGNOSIS — Z7982 Long term (current) use of aspirin: Secondary | ICD-10-CM | POA: Insufficient documentation

## 2018-06-20 DIAGNOSIS — E1122 Type 2 diabetes mellitus with diabetic chronic kidney disease: Secondary | ICD-10-CM | POA: Insufficient documentation

## 2018-06-20 DIAGNOSIS — I129 Hypertensive chronic kidney disease with stage 1 through stage 4 chronic kidney disease, or unspecified chronic kidney disease: Secondary | ICD-10-CM | POA: Insufficient documentation

## 2018-06-20 DIAGNOSIS — F1099 Alcohol use, unspecified with unspecified alcohol-induced disorder: Secondary | ICD-10-CM

## 2018-06-20 DIAGNOSIS — F17210 Nicotine dependence, cigarettes, uncomplicated: Secondary | ICD-10-CM | POA: Diagnosis not present

## 2018-06-20 DIAGNOSIS — E785 Hyperlipidemia, unspecified: Secondary | ICD-10-CM | POA: Insufficient documentation

## 2018-06-20 LAB — POCT GLYCOSYLATED HEMOGLOBIN (HGB A1C): HBA1C, POC (PREDIABETIC RANGE): 5.6 % — AB (ref 5.7–6.4)

## 2018-06-20 LAB — GLUCOSE, POCT (MANUAL RESULT ENTRY): POC Glucose: 132 mg/dl — AB (ref 70–99)

## 2018-06-20 MED ORDER — PNEUMOCOCCAL 13-VAL CONJ VACC IM SUSP: 0.5000 mL | INTRAMUSCULAR | 0 refills | Status: AC

## 2018-06-20 MED ORDER — ACETAMINOPHEN-CODEINE #3 300-30 MG PO TABS: 1.0000 | ORAL_TABLET | Freq: Three times a day (TID) | ORAL | 0 refills | Status: DC | PRN

## 2018-06-20 MED FILL — ACETAMINOPHEN/COD #3 TABLET: 300-30 | 10 days supply | Qty: 30 | Fill #0

## 2018-06-20 NOTE — Progress Notes (Signed)
Patient ID: Robert Sparks, male    DOB: [birth year]  MRN: [id number]  CC: Hypertension and Diabetes   Subjective: Robert Sparks is a 72 y.o. male who presents for hosp f/u  His concerns today include:  Patient with history of HTN, diabetes type 2,HL,CKD stage3, tobacco dependence, EtOH abuse, prostate CA, OA knee, vit B 12defand multiple myeloma in remission  Patient hospitalized 10/24- 26/2019 with sepsis of unclear source.  Patient presented with fever, tachycardia and dry cough.  Chest x-ray did not reveal any acute issues.  He had mild pancytopenia.  Blood cultures were negative.  He was treated empirically with antibiotics.  He has seen his oncologist Dr. Irene Limbo since then. On new med called Ninlaro.  Med causing some numbness in hands which he states he has spoken to his oncologist about already.    DM:  Checking BS 2-3 times/day.  Did not bring log.  Gives range in 90-mid 100.  No lows.  Reports good appetite.  Compliant with insulin 7 units BID No numbness in feet.   HTN: compliant with Amlodipine.  He tries to limit salt in the foods.  He denies any chest pains or shortness of breath at rest on exertion.  No lower extremity edema.  Prostate CA: reports that he saw his urologist since last visit and was told that he is still in remission  Pain in knees when he first stands up from seated position.  Does not bother him once he starts walking.  X-ray of the right knee done in June of this year revealed small joint effusion with osteoarthritis changes. Has chronic pain in his lower back which is worse when he is ambulating.  No radiation into the legs.  He denies any cramps in the thigh or lower legs.  Can walk several blocks.  Has to stop once and a while to rest.  His oncologist had wanted [place] to check ABI because of cramps in legs but pt tells me he does not have cramps in legs with ambulation -He uses Voltaren gel not consistently.  He takes Tylenol several times a day for the  back pain and it helps some. Patient Active Problem List   Diagnosis Date Noted  . HCAP (healthcare-associated pneumonia) 05/02/2018  . Cough   . Primary osteoarthritis of right knee 01/07/2018  . Diabetic polyneuropathy associated with type 2 diabetes mellitus ([place]) 01/07/2018  . Vitamin B12 deficiency 09/04/2017  . Pre-ulcerative corn or callous 04/12/2017  . Cataract of both eyes 04/12/2017  . Tobacco dependence 04/12/2017  . Hypokalemia 03/23/2017  . Multiple myeloma in remission ([place]) 02/23/2017  . Oral leukoplakia 02/09/2017  . Lightheadedness 11/10/2016  . Tobacco abuse 06/21/2016  . Hyperlipidemia 06/21/2016  . CKD (chronic kidney disease) stage 3, GFR 30-59 ml/min (HCC) 02/23/2016  . Malignant neoplasm of prostate ([place]) 02/21/2016  . Controlled type 2 diabetes mellitus with stage 3 chronic kidney disease, with long-term current use of insulin ([place]) 03/29/2015  . HTN (hypertension) 07/28/2013  . Anemia, chronic disease 07/26/2013  . Sepsis ([place]) 07/25/2013  . ETOH abuse 07/25/2013     Current Outpatient Medications on File Prior to Visit  Medication Sig Dispense Refill  . ACCU-CHEK AVIVA PLUS test strip 1 each by Other route See admin instructions. 100 each 12  . acyclovir (ZOVIRAX) 400 MG tablet Take 1 tablet (400 mg total) by mouth 2 (two) times daily. 60 tablet 5  . amLODipine (NORVASC) 5 MG tablet Take 1 tablet (5 mg total)  by mouth daily. 90 tablet 3  . aspirin EC 81 MG tablet Take 1 tablet (81 mg total) by mouth daily. 90 tablet 3  . Cholecalciferol (VITAMIN D) 2000 units CAPS Take 2,000 Units by mouth daily.    . diclofenac sodium (VOLTAREN) 1 % GEL Apply 2 g topically 4 (four) times daily. (Patient taking differently: Apply 2 g topically 4 (four) times daily as needed (pain). ) 100 g 2  . DULoxetine (CYMBALTA) 20 MG capsule TAKE 1 CAPSULE BY MOUTH DAILY. 30 capsule 2  . ferrous sulfate 325 (65 FE) MG tablet Take 325 mg by mouth daily.    [name] gabapentin (NEURONTIN)  300 MG capsule TAKE 1 CAPSULE BY MOUTH IN THE MORNING AND AT LUNCH, TAKE 2 CAPSULES BY MOUTH AT NIGHT. (Patient taking differently: Take 300-600 mg by mouth See admin instructions. Take 300 mg by mouth in the morning, 300 mg by mouth at lunch and 600 mg by mouth at bedtime) 120 capsule 4  . insulin aspart protamine - aspart (NOVOLOG 70/30 MIX) (70-30) 100 UNIT/ML FlexPen Inject 0.07 mLs (7 Units total) into the skin 2 (two) times daily with a meal. 15 mL 6  . Insulin Pen Needle (TRUEPLUS PEN NEEDLES) 32G X 4 MM MISC Use as directed twice daily with insulin 100 each 3  . ixazomib citrate (NINLARO) 3 MG capsule Take 1 capsule by mouth on Day 1, Day 8 & D15 of each 28d cycle. Take on empty stomach 1hr before or 2hrs after food. Do not crush,chew,open 3 capsule 5  . Multiple Vitamin (MULTIVITAMIN WITH MINERALS) TABS tablet Take 1 tablet by mouth daily. 60 tablet 2  . pravastatin (PRAVACHOL) 20 MG tablet Take 1 tablet (20 mg total) by mouth daily. 90 tablet 3  . tadalafil (ADCIRCA/CIALIS) 20 MG tablet Take 20 mg by mouth daily as needed for erectile dysfunction.   0  . triamcinolone lotion (KENALOG) 0.1 % Apply 1 application topically 3 (three) times daily as needed. (Patient taking differently: Apply 1 application topically 3 (three) times daily as needed (irritation). ) 60 mL 2  . vitamin B-12 (CYANOCOBALAMIN) 100 MCG tablet Take 100 mcg by mouth daily.     No current facility-administered medications on file prior to visit.     No Known Allergies  Social History   Socioeconomic History  . Marital status: Single    Spouse name: Not on file  . Number of children: Not on file  . Years of education: Not on file  . Highest education level: Not on file  Occupational History  . Not on file  Social Needs  . Financial resource strain: Not on file  . Food insecurity:    Worry: Not on file    Inability: Not on file  . Transportation needs:    Medical: Not on file    Non-medical: Not on file    Tobacco Use  . Smoking status: Current Every Day Smoker    Packs/day: 0.25    Years: 50.00    Pack years: 12.50    Types: Cigarettes  . Smokeless tobacco: Never Used  . Tobacco comment: 1 ppwk-4-5 a day   Substance and Sexual Activity  . Alcohol use: Yes    Alcohol/week: 1.0 standard drinks    Types: 1 Cans of beer per week    Comment: 1 every day-quit couple weeks ago  . Drug use: No  . Sexual activity: Not Currently  Lifestyle  . Physical activity:    Days per week:  Not on file    Minutes per session: Not on file  . Stress: Not on file  Relationships  . Social connections:    Talks on phone: Not on file    Gets together: Not on file    Attends religious service: Not on file    Active member of club or organization: Not on file    Attends meetings of clubs or organizations: Not on file    Relationship status: Not on file  . Intimate partner violence:    Fear of current or ex partner: Not on file    Emotionally abused: Not on file    Physically abused: Not on file    Forced sexual activity: Not on file  Other Topics Concern  . Not on file  Social History Narrative  . Not on file    Family History  Problem Relation Age of Onset  . Cancer Neg Hx   . Colon cancer Neg Hx   . Colon polyps Neg Hx   . Esophageal cancer Neg Hx   . Rectal cancer Neg Hx   . Stomach cancer Neg Hx     Past Surgical History:  Procedure Laterality Date  . CIRCUMCISION  1990's  . PROSTATE BIOPSY N/A 12/21/2015   Procedure: BIOPSY TRANSRECTAL ULTRASONIC PROSTATE (TUBP);  Surgeon: Festus Aloe, MD;  Location: [name];  Service: Urology;  Laterality: N/A;    ROS: Review of Systems Neg except as above PHYSICAL EXAM: BP 121/74   Pulse 72   Temp 97.7 F (36.5 C) (Oral)   Resp 16   Wt 160 lb 3.2 oz (72.7 kg)   SpO2 99%   BMI 27.50 kg/m   Wt Readings from Last 3 Encounters:  06/20/18 160 lb 3.2 oz (72.7 kg)  06/10/18 156 lb 14.4 oz (71.2 kg)  05/06/18 152 lb  14.4 oz (69.4 kg)    Physical Exam  General appearance - alert, well appearing, and in no distress Mental status - normal mood, behavior, speech, dress, motor activity, and thought processes Mouth - mucous membranes moist, pharynx normal without lesions Neck - supple, no significant adenopathy Chest - clear to auscultation, no wheezes, rales or rhonchi, symmetric air entry Heart - normal rate, regular rhythm, normal S1, S2, no murmurs, rubs, clicks or gallops Musculoskeletal -knees: No edema or point tenderness at this time.  He has pretty good range of motion.  Extremities -legs are warm.  He has 3+ femoral, popliteal and dorsalis pedis pulses bilaterally.  Results for orders placed or performed in visit on 06/20/18  POCT glucose (manual entry)  Result Value Ref Range   POC Glucose 132 (A) 70 - 99 mg/dl  POCT glycosylated hemoglobin (Hb A1C)  Result Value Ref Range   Hemoglobin A1C     HbA1c POC (<> result, manual entry)     HbA1c, POC (prediabetic range) 5.6 (A) 5.7 - 6.4 %   HbA1c, POC (controlled diabetic range)      ASSESSMENT AND PLAN: 1. Controlled type 2 diabetes mellitus with stage 3 chronic kidney disease, with long-term current use of insulin (HCC) Well-controlled on current dose of insulin without hypoglycemia.  He will continue current dose.  Continue healthy eating habits. - POCT glucose (manual entry) - POCT glycosylated hemoglobin (Hb A1C)  2. Essential hypertension At goal.  Continue Norvasc.  3. Degenerative disc disease, lumbar Patient does not have symptoms to suggest claudication in the legs. I did not check a PSA as patient reports that he had  seen his urologist recently and told that he was still in remission. I will try him with Tylenol with codeine to see if it works a little bit better than the regular Tylenol.  Patient advised to stop regular Tylenol.  Informed that medication can cause drowsiness.  Advised to take only when needed. NCCSRS reviewed -  acetaminophen-codeine (TYLENOL #3) 300-30 MG tablet; Take 1 tablet by mouth every 8 (eight) hours as needed for moderate pain.  Dispense: 30 tablet; Refill: 0  4. Primary osteoarthritis of both knees Advised patient to use the Voltaren gel on his knees at least twice a day.  Recommend that he use it first thing in the mornings after he gets dressed and ready to start his day. - acetaminophen-codeine (TYLENOL #3) 300-30 MG tablet; Take 1 tablet by mouth every 8 (eight) hours as needed for moderate pain.  Dispense: 30 tablet; Refill: 0  5. Multiple myeloma in remission ([place]) Followed by oncology.  6. Need for vaccination against Streptococcus pneumoniae using pneumococcal conjugate vaccine 13 Given today.   Patient was given the opportunity to ask questions.  Patient verbalized understanding of the plan and was able to repeat key elements of the plan.   Orders Placed This Encounter  Procedures  . Pneumococcal conjugate vaccine 13-valent IM  . POCT glucose (manual entry)  . POCT glycosylated hemoglobin (Hb A1C)     Requested Prescriptions   Signed Prescriptions Disp Refills  . acetaminophen-codeine (TYLENOL #3) 300-30 MG tablet 30 tablet 0    Sig: Take 1 tablet by mouth every 8 (eight) hours as needed for moderate pain.  . pneumococcal 13-valent conjugate vaccine (PREVNAR 13) SUSP injection 0.5 mL 0    Sig: Inject 0.5 mLs into the muscle tomorrow at 10 am for 1 dose.    Return in about 3 months (around 09/19/2018).  Karle Plumber, MD, FACP

## 2018-06-20 NOTE — Patient Instructions (Addendum)
I recommend using the Voltaren gel on your knees at least 2-3 times a day.  Put it on first thing in the mornings want to get dressed.  You should also apply it to your lower back.  Stop the regular Tylenol.  I have written a prescription for some Tylenol with codeine.  This medication can cause drowsiness.  Take only when needed for pain.   Pneumococcal Conjugate Vaccine (PCV13) What You Need to Know 1. Why get vaccinated? Vaccination can protect both children and adults from pneumococcal disease. Pneumococcal disease is caused by bacteria that can spread from person to person through close contact. It can cause ear infections, and it can also lead to more serious infections of the:  Lungs (pneumonia),  Blood (bacteremia), and  Covering of the brain and spinal cord (meningitis).  Pneumococcal pneumonia is most common among adults. Pneumococcal meningitis can cause deafness and brain damage, and it kills about 1 child in 10 who get it. Anyone can get pneumococcal disease, but children under 74 years of age and adults 67 years and older, people with certain medical conditions, and cigarette smokers are at the highest risk. Before there was a vaccine, the [place] saw:  more than 700 cases of meningitis,  about 13,000 blood infections,  about 5 million ear infections, and  about 200 deaths  in children under 5 each year from pneumococcal disease. Since vaccine became available, severe pneumococcal disease in these children has fallen by 88%. About 18,000 older adults die of pneumococcal disease each year in the [place]. Treatment of pneumococcal infections with penicillin and other drugs is not as effective as it used to be, because some strains of the disease have become resistant to these drugs. This makes prevention of the disease, through vaccination, even more important. 2. PCV13 vaccine Pneumococcal conjugate vaccine (called PCV13) protects against 13 types of  pneumococcal bacteria. PCV13 is routinely given to children at 2, 4, 6, and 85-66 months of age. It is also recommended for children and adults 54 to 67 years of age with certain health conditions, and for all adults 71 years of age and older. Your doctor can give you details. 3. Some people should not get this vaccine Anyone who has ever had a life-threatening allergic reaction to a dose of this vaccine, to an earlier pneumococcal vaccine called PCV7, or to any vaccine containing diphtheria toxoid (for example, DTaP), should not get PCV13. Anyone with a severe allergy to any component of PCV13 should not get the vaccine. Tell your doctor if the person being vaccinated has any severe allergies. If the person scheduled for vaccination is not feeling well, your healthcare provider might decide to reschedule the shot on another day. 4. Risks of a vaccine reaction With any medicine, including vaccines, there is a chance of reactions. These are usually mild and go away on their own, but serious reactions are also possible. Problems reported following PCV13 varied by age and dose in the series. The most common problems reported among children were:  About half became drowsy after the shot, had a temporary loss of appetite, or had redness or tenderness where the shot was given.  About 1 out of 3 had swelling where the shot was given.  About 1 out of 3 had a mild fever, and about 1 in 20 had a fever over 102.43F.  Up to about 8 out of 10 became fussy or irritable.  Adults have reported pain, redness, and swelling where the shot  was given; also mild fever, fatigue, headache, chills, or muscle pain. Young children who get PCV13 along with inactivated flu vaccine at the same time may be at increased risk for seizures caused by fever. Ask your doctor for more information. Problems that could happen after any vaccine:  People sometimes faint after a medical procedure, including vaccination. Sitting or lying  down for about 15 minutes can help prevent fainting, and injuries caused by a fall. Tell your doctor if you feel dizzy, or have vision changes or ringing in the ears.  Some older children and adults get severe pain in the shoulder and have difficulty moving the arm where a shot was given. This happens very rarely.  Any medication can cause a severe allergic reaction. Such reactions from a vaccine are very rare, estimated at about 1 in a million doses, and would happen within a few minutes to a few hours after the vaccination. As with any medicine, there is a very small chance of a vaccine causing a serious injury or death. The safety of vaccines is always being monitored. For more information, visit: http://www.aguilar.org/ 5. What if there is a serious reaction? What should I look for? Look for anything that concerns you, such as signs of a severe allergic reaction, very high fever, or unusual behavior. Signs of a severe allergic reaction can include hives, swelling of the face and throat, difficulty breathing, a fast heartbeat, dizziness, and weakness-usually within a few minutes to a few hours after the vaccination. What should I do?  If you think it is a severe allergic reaction or other emergency that can't wait, call 9-1-1 or get the person to the nearest hospital. Otherwise, call your doctor.  Reactions should be reported to the Vaccine Adverse Event Reporting System (VAERS). Your doctor should file this report, or you can do it yourself through the VAERS web site at www.vaers.SamedayNews.es, or by calling 323-118-4207. ? VAERS does not give medical advice. 6. The National Vaccine Injury Compensation Program The [employer] Vaccine Injury Compensation Program (VICP) is a federal program that was created to compensate people who may have been injured by certain vaccines. Persons who believe they may have been injured by a vaccine can learn about the program and about filing a claim by calling  ([phone] or visiting the [place] website at GoldCloset.com.ee. There is a time limit to file a claim for compensation. 7. How can I learn more?  Ask your healthcare provider. He or she can give you the vaccine package insert or suggest other sources of information.  Call your local or state health department.  Contact the Centers for Disease Control and Prevention (CDC): ? Call 418-099-7529 (1-800-CDC-INFO) or ? Visit CDC's website at http://hunter.com/ Vaccine Information Statement, PCV13 Vaccine (05/14/2014) This information is not intended to replace advice given to you by your health care provider. Make sure you discuss any questions you have with your health care provider. Document Released: 04/23/2006 Document Revised: 03/16/2016 Document Reviewed: 03/16/2016 Elsevier Interactive Patient Education  2017 [employer].

## 2018-06-24 MED FILL — ACCU-CHEK AVIVA PLUS TEST S: 30 days supply | Qty: 100 | Fill #10

## 2018-06-27 MED FILL — NINLARO 3 MG CAPSULE: 3 | 28 days supply | Qty: 3 | Fill #1

## 2018-07-05 MED FILL — GABAPENTIN 300 MG CAPSULE: 300 | 30 days supply | Qty: 120 | Fill #3

## 2018-07-15 NOTE — Progress Notes (Signed)
HEMATOLOGY/ONCOLOGY CLINIC NOTE  Date of Service: 07/16/2018  Patient Care Team: [name], MD as PCP - General (Internal Medicine) [name], MD as Consulting Physician (Hematology and Oncology)  CHIEF COMPLAINTS/PURPOSE OF CONSULTATION:  F/u for continued mx of Multiple Myeloma  Oncologic History:  73 y.o. male with diagnosis of IgA lambda active multiple myeloma, ISS Stage I. Active disease diagnosed based on presence of anemia, kidney insufficiency, and paraproteinemia with significant predominance of lambda light chains as well as significant elevation of IgA. Bone marrow biopsy confirmed presence of atypical monoclonal plasma cell process in the bone marrow comprising at least 7% of the cellularity. Based on the findings, patient was started on treatment with lenalidomide, bortezomib, and low-dose dexamethasone based on the anticipated tolerance by the patient. Lenalidomide was started at 10 mg daily based on creatinine clearance of 42, dexamethasone dose was reduced to 20 mg weekly based on patient's age. Treatment was complicated by rapid development of cutaneous rash attributed to lenalidomide, inaddition to decreased renal function and now patient has persistent severe anemia. Side-effects were attributed to Lenalidomide and lenalidomide was discontinued with subsequent recovery.  Patient has been receiving bortezomib weekly with low-dose dexamethasone in 4-day cycles.  Patient has completed induction systemic therapy for his disease with repeat bone marrow biopsy confirming complete response including no evidence of minimal residual disease by cytogenetics or FISH.  HISTORY OF PRESENTING ILLNESS:   Robert Sparks is a wonderful 73 y.o. male who has been referred to [place] by my colleague Dr Grace Isaac for evaluation and management of Multiple Myeloma. The pt reports that he is doing well overall.   The pt has been previously followed by my colleague Dr Grace Isaac. The pt was treated with Revlimid, Velcade, and dexamethasone and was subsequently switched to Velcade and Dexamethasone after a Revlimid intolerance.   The pt reports some knee aches and skin soreness in his legs which is worse at night. He notes that he has had diabetic-related peripheral neuropathy. The pt has not seen [facility] yet for a discussion of a BM transplant. He continues taking Acyclovir and notes no difficulty taking maintenance Velcade.   He adds that he takes Gabapentin and sweats.  He notes that his neuropathy is currently stable and denies it worsening.   He notes that a pack of cigarettes lasts him for about 3 days.   Most recent lab results (12/14/17) of CBC and CMP  is as follows: all values are WNL except for RBC at 3.08, HGB at 10.1, HCT at 31.0, MCV at 100.6, Eosinophils Abs at 1.4k, Potassium at 3.4, Glucose at 156, Creatinine at 1.60. LDH 12/14/17 is 141 Beta 2 microglobulin 12/14/17 is 2.4  On review of systems, pt reports knee pain, night time LE skin soreness, peripheral neuropathy, eating well, strong appetite, intermittent sweating and denies fevers, chills, back pains, abdominal pains, and any other symptoms.   [name] DM type 2, HTN, CKD. On Social Hx the pt reports smoking a pack of cigarettes every 3 days.   [name] returns today regarding management and evaluation of his Multiple Myeloma. [name] was on 06/10/18. The pt reports that he is doing well overall.   The pt reports that his neuropathy has been stable and he continues to have numbness in his fingertips, and denies this worsening. The pt notes that he has continued to take Vitamin B12 replacement  but did not start a Vitamin B complex as was previously recommended.   The pt notes that he has had some sweats at night since he began his myeloma treatment.    Lab results today (07/16/18) of CBC w/diff and CMP is as follows: all  values are WNL except for RBC at 3.40, HGB at 11.2, HCT at 34.1, MCV at 100.3, PLT at 145k, Eosinophils abs at 1.9k, Glucose at 61, BUN at 28, Creatinine at 1.62, GFR at 49.  On review of systems, pt reports stable neuropathy, good energy levels, night sweats, and denies fevers, concerns for infections, chills, and any other symptoms.   MEDICAL HISTORY:  Past Medical History:  Diagnosis Date  . Anemia   . Arthritis    shoulders,feet   . Cataract    per eye dr -has appt 5-11  . CKD (chronic kidney disease), stage III ([place])   . Elevated PSA   . History of ketoacidosis    03-29-2015   . History of sepsis    07-25-2013  non-compliant w/ medication  . Hyperlipidemia   . Hypertension   . Nocturia   . Peripheral neuropathy   . Prostate cancer ([place])   . Type 2 diabetes mellitus with insulin therapy ([place])     SURGICAL HISTORY: Past Surgical History:  Procedure Laterality Date  . CIRCUMCISION  1990's  . PROSTATE BIOPSY N/A 12/21/2015   Procedure: BIOPSY TRANSRECTAL ULTRASONIC PROSTATE (TUBP);  Surgeon: Festus Aloe, MD;  Location: [facility];  Service: Urology;  Laterality: N/A;    SOCIAL HISTORY: Social History   Socioeconomic History  . Marital status: Single    Spouse name: Not on file  . Number of children: Not on file  . Years of education: Not on file  . Highest education level: Not on file  Occupational History  . Not on file  Social Needs  . Financial resource strain: Not on file  . Food insecurity:    Worry: Not on file    Inability: Not on file  . Transportation needs:    Medical: Not on file    Non-medical: Not on file  Tobacco Use  . Smoking status: Current Every Day Smoker    Packs/day: 0.25    Years: 50.00    Pack years: 12.50    Types: Cigarettes  . Smokeless tobacco: Never Used  . Tobacco comment: 1 ppwk-4-5 a day   Substance and Sexual Activity  . Alcohol use: Yes    Alcohol/week: 1.0 standard drinks    Types: 1 Cans of beer  per week    Comment: 1 every day-quit couple weeks ago  . Drug use: No  . Sexual activity: Not Currently  Lifestyle  . Physical activity:    Days per week: Not on file    Minutes per session: Not on file  . Stress: Not on file  Relationships  . Social connections:    Talks on phone: Not on file    Gets together: Not on file    Attends religious service: Not on file    Active member of club or organization: Not on file    Attends meetings of clubs or organizations: Not on file    Relationship status: Not on file  . Intimate partner violence:    Fear of current or ex partner: Not on file    Emotionally abused: Not on file    Physically abused: Not on file    Forced sexual activity: Not on file  Other Topics Concern  . Not on file  Social History Narrative  . Not on file    FAMILY HISTORY: Family History  Problem Relation Age of Onset  . Cancer Neg Hx   . Colon cancer Neg Hx   . Colon polyps Neg Hx   . Esophageal cancer Neg Hx   . Rectal cancer Neg Hx   . Stomach cancer Neg Hx     ALLERGIES:  has No Known Allergies.  MEDICATIONS:  Current Outpatient Medications  Medication Sig Dispense Refill  . ACCU-CHEK AVIVA PLUS test strip 1 each by Other route See admin instructions. 100 each 12  . acetaminophen-codeine (TYLENOL #3) 300-30 MG tablet Take 1 tablet by mouth every 8 (eight) hours as needed for moderate pain. 30 tablet 0  . acyclovir (ZOVIRAX) 400 MG tablet Take 1 tablet (400 mg total) by mouth 2 (two) times daily. 60 tablet 5  . amLODipine (NORVASC) 5 MG tablet Take 1 tablet (5 mg total) by mouth daily. 90 tablet 3  . aspirin EC 81 MG tablet Take 1 tablet (81 mg total) by mouth daily. 90 tablet 3  . Cholecalciferol (VITAMIN D) 2000 units CAPS Take 2,000 Units by mouth daily.    . diclofenac sodium (VOLTAREN) 1 % GEL Apply 2 g topically 4 (four) times daily. (Patient taking differently: Apply 2 g topically 4 (four) times daily as needed (pain). ) 100 g 2  . DULoxetine  (CYMBALTA) 20 MG capsule TAKE 1 CAPSULE BY MOUTH DAILY. 30 capsule 2  . ferrous sulfate 325 (65 FE) MG tablet Take 325 mg by mouth daily.    [name] gabapentin (NEURONTIN) 300 MG capsule TAKE 1 CAPSULE BY MOUTH IN THE MORNING AND AT LUNCH, TAKE 2 CAPSULES BY MOUTH AT NIGHT. (Patient taking differently: Take 300-600 mg by mouth See admin instructions. Take 300 mg by mouth in the morning, 300 mg by mouth at lunch and 600 mg by mouth at bedtime) 120 capsule 4  . insulin aspart protamine - aspart (NOVOLOG 70/30 MIX) (70-30) 100 UNIT/ML FlexPen Inject 0.07 mLs (7 Units total) into the skin 2 (two) times daily with a meal. 15 mL 6  . Insulin Pen Needle (TRUEPLUS PEN NEEDLES) 32G X 4 MM MISC Use as directed twice daily with insulin 100 each 3  . ixazomib citrate (NINLARO) 3 MG capsule Take 1 capsule by mouth on Day 1, Day 8 & D15 of each 28d cycle. Take on empty stomach 1hr before or 2hrs after food. Do not crush,chew,open 3 capsule 5  . Multiple Vitamin (MULTIVITAMIN WITH MINERALS) TABS tablet Take 1 tablet by mouth daily. 60 tablet 2  . pravastatin (PRAVACHOL) 20 MG tablet Take 1 tablet (20 mg total) by mouth daily. 90 tablet 3  . tadalafil (ADCIRCA/CIALIS) 20 MG tablet Take 20 mg by mouth daily as needed for erectile dysfunction.   0  . triamcinolone lotion (KENALOG) 0.1 % Apply 1 application topically 3 (three) times daily as needed. (Patient taking differently: Apply 1 application topically 3 (three) times daily as needed (irritation). ) 60 mL 2  . vitamin B-12 (CYANOCOBALAMIN) 100 MCG tablet Take 100 mcg by mouth daily.    [name] b complex vitamins capsule Take 1 capsule by mouth daily. 30 capsule 5   No current facility-administered medications for this visit.     REVIEW OF SYSTEMS:    A 10+ POINT REVIEW OF SYSTEMS WAS OBTAINED including neurology, dermatology, psychiatry, cardiac, respiratory, lymph, extremities, GI, GU, Musculoskeletal, constitutional, breasts, reproductive, HEENT.  All pertinent positives  are noted in the HPI.  All others are negative.   PHYSICAL EXAMINATION: ECOG PERFORMANCE STATUS: 1 - Symptomatic but completely ambulatory  GENERAL:alert, in no acute distress and comfortable SKIN: no acute rashes, no significant lesions EYES: conjunctiva are pink and non-injected, sclera anicteric OROPHARYNX: MMM, no exudates, no oropharyngeal erythema or ulceration NECK: supple, no JVD LYMPH:  no palpable lymphadenopathy in the cervical, axillary or inguinal regions LUNGS: clear to auscultation b/l with normal respiratory effort HEART: regular rate & rhythm ABDOMEN:  normoactive bowel sounds , non tender, not distended. No palpable hepatosplenomegaly.  Extremity: no pedal edema PSYCH: alert & oriented x 3 with fluent speech NEURO: no focal motor/sensory deficits   LABORATORY DATA:  I have reviewed the data as listed  . CBC Latest Ref Rng & Units 07/16/2018 06/10/2018 05/06/2018  WBC 4.0 - 10.5 K/uL 5.5 5.3 4.2  Hemoglobin 13.0 - 17.0 g/dL 11.2(L) 10.3(L) 9.6(L)  Hematocrit 39.0 - 52.0 % 34.1(L) 31.3(L) 30.0(L)  Platelets 150 - 400 K/uL 145(L) 258 188    . CMP Latest Ref Rng & Units 07/16/2018 06/10/2018 05/06/2018  Glucose 70 - 99 mg/dL 61(L) 110(H) 120(H)  BUN 8 - 23 mg/dL 28(H) 31(H) 25(H)  Creatinine 0.61 - 1.24 mg/dL 1.62(H) 1.60(H) 1.57(H)  Sodium 135 - 145 mmol/L 139 139 139  Potassium 3.5 - 5.1 mmol/L 3.8 4.2 4.0  Chloride 98 - 111 mmol/L 104 104 105  CO2 22 - 32 mmol/L '25 25 25  ' Calcium 8.9 - 10.3 mg/dL 10.0 9.4 9.7  Total Protein 6.5 - 8.1 g/dL 7.3 7.1 7.1  Total Bilirubin 0.3 - 1.2 mg/dL 0.5 0.4 0.5  Alkaline Phos 38 - 126 U/L 96 94 74  AST 15 - 41 U/L '23 22 28  ' ALT 0 - 44 U/L '20 16 19   ' 08/29/17 BM Bx:    08/29/17 Cytogenetics:   03/13/17 Cytogenetics:          RADIOGRAPHIC STUDIES: I have personally reviewed the radiological images as listed and agreed with the findings in the report. No results found.  ASSESSMENT & PLAN:   73 y.o. male with    1. IgA Lambda Multiple Myeloma ISS Stage I -Currently in remission  Active disease was previously diagnosed based on presence of anemia, kidney insufficiency, and paraproteinemia with significant predominance of lambda light chains as well as significant elevation of IgA.  PLAN:  -Discussed pt labwork today, 07/16/18; HGB continued to improve to 11.2, blood counts and chemistries are otherwise stable  -Last available SFLC from 06/10/18 revealed a slight increase in Kappa to 58.7 -Last available MMP from 06/10/18 revealed no M Protein. -The pt has no prohibitive toxicities from continuing Ninlaro at this time -Continue Gabapentin and Cymbalta per PCP -Recommend PCP rule out Peripheral arterial disease with [place] with ABIs given leg cramping -Continue Acyclovir -Recommended that the pt begin a Vitamin B complex -Will hold Xgeva at this time with dental work -Will see the pt back in 2 months    RTC with Dr Irene Limbo with labs in 2 months   All of the patients questions were answered with apparent satisfaction. The patient knows to call the clinic with any problems, questions or concerns.  The total time spent in the appt was 20 minutes and more than 50% was on counseling and direct patient cares.     Sullivan Lone MD [place] AAHIVMS [facility] Hematology/Oncology Physician [facility]  (Office):       [phone] (  Work cell):  [phone] (Fax):           [phone]  07/16/2018 2:36 PM  I, Baldwin [place], am acting as a scribe for Dr. Sullivan Lone.   .I have reviewed the above documentation for accuracy and completeness, and I agree with the above. Brunetta Genera MD

## 2018-07-16 ENCOUNTER — Inpatient Hospital Stay: Payer: Medicare HMO

## 2018-07-16 ENCOUNTER — Telehealth: Payer: Self-pay

## 2018-07-16 ENCOUNTER — Encounter: Payer: Self-pay | Admitting: Hematology

## 2018-07-16 ENCOUNTER — Inpatient Hospital Stay: Payer: Medicare HMO | Attending: Hematology and Oncology | Admitting: Hematology

## 2018-07-16 VITALS — BP 145/80 | HR 78 | Temp 98.1°F | Resp 18 | Ht 64.0 in | Wt 157.1 lb

## 2018-07-16 DIAGNOSIS — I129 Hypertensive chronic kidney disease with stage 1 through stage 4 chronic kidney disease, or unspecified chronic kidney disease: Secondary | ICD-10-CM

## 2018-07-16 DIAGNOSIS — E1142 Type 2 diabetes mellitus with diabetic polyneuropathy: Secondary | ICD-10-CM | POA: Diagnosis not present

## 2018-07-16 DIAGNOSIS — C9001 Multiple myeloma in remission: Secondary | ICD-10-CM | POA: Diagnosis not present

## 2018-07-16 DIAGNOSIS — N189 Chronic kidney disease, unspecified: Secondary | ICD-10-CM | POA: Insufficient documentation

## 2018-07-16 DIAGNOSIS — F17210 Nicotine dependence, cigarettes, uncomplicated: Secondary | ICD-10-CM | POA: Insufficient documentation

## 2018-07-16 DIAGNOSIS — C9000 Multiple myeloma not having achieved remission: Secondary | ICD-10-CM

## 2018-07-16 DIAGNOSIS — E1122 Type 2 diabetes mellitus with diabetic chronic kidney disease: Secondary | ICD-10-CM | POA: Diagnosis not present

## 2018-07-16 LAB — CMP (CANCER CENTER ONLY)
ALT: 20 U/L (ref 0–44)
AST: 23 U/L (ref 15–41)
Albumin: 4.1 g/dL (ref 3.5–5.0)
Alkaline Phosphatase: 96 U/L (ref 38–126)
Anion gap: 10 (ref 5–15)
BUN: 28 mg/dL — AB (ref 8–23)
CO2: 25 mmol/L (ref 22–32)
CREATININE: 1.62 mg/dL — AB (ref 0.61–1.24)
Calcium: 10 mg/dL (ref 8.9–10.3)
Chloride: 104 mmol/L (ref 98–111)
GFR, EST AFRICAN AMERICAN: 49 mL/min — AB (ref >60)
GFR, EST NON AFRICAN AMERICAN: 42 mL/min — AB (ref >60)
Glucose, Bld: 61 mg/dL — ABNORMAL LOW (ref 70–99)
POTASSIUM: 3.8 mmol/L (ref 3.5–5.1)
Sodium: 139 mmol/L (ref 135–145)
TOTAL PROTEIN: 7.3 g/dL (ref 6.5–8.1)
Total Bilirubin: 0.5 mg/dL (ref 0.3–1.2)

## 2018-07-16 LAB — CBC WITH DIFFERENTIAL/PLATELET
Abs Immature Granulocytes: 0.01 10*3/uL (ref 0.00–0.07)
BASOS ABS: 0 10*3/uL (ref 0.0–0.1)
Basophils Relative: 1 %
EOS PCT: 35 %
Eosinophils Absolute: 1.9 10*3/uL — ABNORMAL HIGH (ref 0.0–0.5)
HCT: 34.1 % — ABNORMAL LOW (ref 39.0–52.0)
Hemoglobin: 11.2 g/dL — ABNORMAL LOW (ref 13.0–17.0)
Immature Granulocytes: 0 %
Lymphocytes Relative: 15 %
Lymphs Abs: 0.8 10*3/uL (ref 0.7–4.0)
MCH: 32.9 pg (ref 26.0–34.0)
MCHC: 32.8 g/dL (ref 30.0–36.0)
MCV: 100.3 fL — ABNORMAL HIGH (ref 80.0–100.0)
MONO ABS: 0.5 10*3/uL (ref 0.1–1.0)
Monocytes Relative: 8 %
NRBC: 0 % (ref 0.0–0.2)
Neutro Abs: 2.3 10*3/uL (ref 1.7–7.7)
Neutrophils Relative %: 41 %
Platelets: 145 10*3/uL — ABNORMAL LOW (ref 150–400)
RBC: 3.4 MIL/uL — AB (ref 4.22–5.81)
RDW: 13.8 % (ref 11.5–15.5)
WBC: 5.5 10*3/uL (ref 4.0–10.5)

## 2018-07-16 MED ORDER — B COMPLEX VITAMINS PO CAPS: 1.0000 | ORAL_CAPSULE | Freq: Every day | ORAL | 5 refills | Status: AC

## 2018-07-16 NOTE — Patient Instructions (Addendum)
Thank you for choosing [facility] to provide your oncology and hematology care.  To afford each patient quality time with our providers, please arrive 30 minutes before your scheduled appointment time.  If you arrive late for your appointment, you may be asked to reschedule.  We strive to give you quality time with our providers, and arriving late affects you and other patients whose appointments are after yours.    If you are a no show for multiple scheduled visits, you may be dismissed from the clinic at the providers discretion.     Again, thank you for choosing [facility], our hope is that these requests will decrease the amount of time that you wait before being seen by our physicians.  ______________________________________________________________________   Should you have questions after your visit to the [facility], please contact our office at [phone] between the hours of 8:30 and 4:30 p.m.    Voicemails left after 4:30p.m will not be returned until the following business day.     For prescription refill requests, please have your pharmacy contact us directly.  Please also try to allow 48 hours for prescription requests.     Please contact the scheduling department for questions regarding scheduling.  For scheduling of procedures such as PET scans, CT scans, MRI, Ultrasound, etc please contact central scheduling at [phone].     Resources For Cancer Patients and Caregivers:    On[employer]ink.org:  A wonderful resource for patients and healthcare providers for information regarding your disease, ways to tract your treatment, what to expect, etc.      [place]:  [phone]  Can help patients locate various types of support and financial assistance   Cancer Care: 1-800-813-HOPE 757-606-1694) Provides financial assistance, online support groups, medication/co-pay assistance.     [place]:  [phone] Where to apply  for food stamps, Medicaid, and utility assistance   Medicare Rights [facility]: (737) 749-1131 Helps people with Medicare understand their rights and benefits, navigate the Medicare system, and secure the quality healthcare they deserve   [employer]: [place] [employer]'s shared-ride transportation service for eligible riders who have a disability that prevents them from riding the fixed route bus.     For additional information on assistance programs please contact our social worker:   Johnnye Lana:  [place] taking a Vitamin B complex every day

## 2018-07-16 NOTE — Telephone Encounter (Signed)
Printed avs and calender of upcoming appointment. Per 1/7 los 

## 2018-07-17 MED FILL — ACCU-CHEK AVIVA PLUS TEST S: 30 days supply | Qty: 100 | Fill #11

## 2018-07-17 MED FILL — DULoxetine HCL 20 MG CPEP: 20 | 30 days supply | Qty: 30 | Fill #2

## 2018-07-18 ENCOUNTER — Other Ambulatory Visit: Payer: Self-pay | Admitting: Internal Medicine

## 2018-07-18 DIAGNOSIS — M170 Bilateral primary osteoarthritis of knee: Secondary | ICD-10-CM

## 2018-07-18 DIAGNOSIS — M5136 Other intervertebral disc degeneration, lumbar region: Secondary | ICD-10-CM

## 2018-07-19 NOTE — Telephone Encounter (Signed)
Pt last seen: 06/20/18 Next appt: 09/19/18 Last RX written on: 06/20/18 Date of original fill: 06/20/18 Date of refill(s): n/a  No other controlled substances were filled during this time, please refill if appropriate.

## 2018-07-24 ENCOUNTER — Encounter (HOSPITAL_COMMUNITY): Payer: Self-pay | Admitting: Emergency Medicine

## 2018-07-24 ENCOUNTER — Emergency Department (HOSPITAL_COMMUNITY)
Admission: EM | Admit: 2018-07-24 | Discharge: 2018-07-24 | Disposition: A | Discharge status: Home / Self Care | Payer: Medicare HMO | Attending: Emergency Medicine | Admitting: Emergency Medicine

## 2018-07-24 DIAGNOSIS — R200 Anesthesia of skin: Secondary | ICD-10-CM | POA: Insufficient documentation

## 2018-07-24 DIAGNOSIS — Z8546 Personal history of malignant neoplasm of prostate: Secondary | ICD-10-CM | POA: Insufficient documentation

## 2018-07-24 DIAGNOSIS — E1122 Type 2 diabetes mellitus with diabetic chronic kidney disease: Secondary | ICD-10-CM | POA: Insufficient documentation

## 2018-07-24 DIAGNOSIS — R202 Paresthesia of skin: Secondary | ICD-10-CM | POA: Diagnosis not present

## 2018-07-24 DIAGNOSIS — Z7982 Long term (current) use of aspirin: Secondary | ICD-10-CM | POA: Diagnosis not present

## 2018-07-24 DIAGNOSIS — I10 Essential (primary) hypertension: Secondary | ICD-10-CM | POA: Diagnosis not present

## 2018-07-24 DIAGNOSIS — I129 Hypertensive chronic kidney disease with stage 1 through stage 4 chronic kidney disease, or unspecified chronic kidney disease: Secondary | ICD-10-CM | POA: Diagnosis not present

## 2018-07-24 DIAGNOSIS — Z79899 Other long term (current) drug therapy: Secondary | ICD-10-CM | POA: Insufficient documentation

## 2018-07-24 DIAGNOSIS — Z794 Long term (current) use of insulin: Secondary | ICD-10-CM | POA: Diagnosis not present

## 2018-07-24 DIAGNOSIS — F17210 Nicotine dependence, cigarettes, uncomplicated: Secondary | ICD-10-CM | POA: Diagnosis not present

## 2018-07-24 DIAGNOSIS — N183 Chronic kidney disease, stage 3 (moderate): Secondary | ICD-10-CM | POA: Insufficient documentation

## 2018-07-24 DIAGNOSIS — E785 Hyperlipidemia, unspecified: Secondary | ICD-10-CM | POA: Insufficient documentation

## 2018-07-24 NOTE — Discharge Instructions (Addendum)
You were given a referral to neurology.The clinic will be contacting you to set up an appointment for follow up.  Please follow up with your primary care provider within 5-7 days for re-evaluation of your symptoms. If you do not have a primary care provider, information for a healthcare clinic has been provided for you to make arrangements for follow up care. Please return to the emergency department for any new or worsening symptoms.

## 2018-07-24 NOTE — ED Triage Notes (Signed)
Pt states for about 1 month he has had numbness and tingling in his right middle, ring and pinky finger. Pt states it is only in his finger tips not in his hand. Pt denies any weakness in hands and had equal grips.

## 2018-07-24 NOTE — ED Provider Notes (Signed)
[facility] DEPARTMENT Provider Note   CSN: [id number] Arrival date & time: 07/24/18  2703     History   Chief Complaint Chief Complaint  Patient presents with  . Numbness    HPI Robert Sparks is a 73 y.o. male.  HPI   Pt is a 73 y/o male with a h/o anemia, CKD, stage III, hyperlipidemia, hypertension, peripheral neuropathy, type 2 diabetes, multiple myeloma, who presents to the ED today c/o paresthesias to his 1st, 2nd, 3rd, 4th, digits on the right hand present for the last 1.5 months. Sxs located to the palmar aspect of the fingers. Sxs noted only to distal fingertips. Sxs are constant. Sxs have not worsened, however he does state sxs are worse at night. He has tried no interventions for his sxs.  Denies HA, weakness, numbness to remainder of extremities, falls, trauma.   Pt states that he has dicussed his sxs with Dr. Cloria Spring, his oncologist, who told him that his sxs were a side effect of Ninlaro. Pt states he has been on this medication for 4-5 months. Reviewed note from visit 07/16/17. It was recommended that he continue ninlaro as he was having on toxic effects.    Past Medical History:  Diagnosis Date  . Anemia   . Arthritis    shoulders,feet   . Cataract    per eye dr -has appt 5-11  . CKD (chronic kidney disease), stage III ([place])   . Elevated PSA   . History of ketoacidosis    03-29-2015   . History of sepsis    07-25-2013  non-compliant w/ medication  . Hyperlipidemia   . Hypertension   . Nocturia   . Peripheral neuropathy   . Prostate cancer ([place])   . Type 2 diabetes mellitus with insulin therapy [facility])     Patient Active Problem List   Diagnosis Date Noted  . HCAP (healthcare-associated pneumonia) 05/02/2018  . Cough   . Primary osteoarthritis of right knee 01/07/2018  . Diabetic polyneuropathy associated with type 2 diabetes mellitus ([place]) 01/07/2018  . Vitamin B12 deficiency 09/04/2017  . Pre-ulcerative corn or  callous 04/12/2017  . Cataract of both eyes 04/12/2017  . Tobacco dependence 04/12/2017  . Hypokalemia 03/23/2017  . Multiple myeloma in remission ([place]) 02/23/2017  . Oral leukoplakia 02/09/2017  . Lightheadedness 11/10/2016  . Tobacco abuse 06/21/2016  . Hyperlipidemia 06/21/2016  . CKD (chronic kidney disease) stage 3, GFR 30-59 ml/min (HCC) 02/23/2016  . Malignant neoplasm of prostate ([place]) 02/21/2016  . Controlled type 2 diabetes mellitus with stage 3 chronic kidney disease, with long-term current use of insulin ([place]) 03/29/2015  . HTN (hypertension) 07/28/2013  . Anemia, chronic disease 07/26/2013  . Sepsis ([place]) 07/25/2013  . ETOH abuse 07/25/2013    Past Surgical History:  Procedure Laterality Date  . CIRCUMCISION  1990's  . PROSTATE BIOPSY N/A 12/21/2015   Procedure: BIOPSY TRANSRECTAL ULTRASONIC PROSTATE (TUBP);  Surgeon: Festus Aloe, MD;  Location: [facility];  Service: Urology;  Laterality: N/A;        Home Medications    Prior to Admission medications   Medication Sig Start Date End Date Taking? Authorizing Provider  ACCU-CHEK AVIVA PLUS test strip 1 each by Other route See admin instructions. 08/24/17   [name], MD  acetaminophen-codeine (TYLENOL #3) 300-30 MG tablet Take 1 tablet by mouth every 8 (eight) hours as needed for moderate pain. 06/20/18   [name], MD  acyclovir (ZOVIRAX) 400  MG tablet Take 1 tablet (400 mg total) by mouth 2 (two) times daily. 04/08/18   Brunetta Genera, MD  amLODipine (NORVASC) 5 MG tablet Take 1 tablet (5 mg total) by mouth daily. 12/05/17   [name], MD  aspirin EC 81 MG tablet Take 1 tablet (81 mg total) by mouth daily. 10/02/16   Maren Reamer, MD  b complex vitamins capsule Take 1 capsule by mouth daily. 07/16/18   Brunetta Genera, MD  Cholecalciferol (VITAMIN D) 2000 units CAPS Take 2,000 Units by mouth daily.    [provider]  diclofenac sodium (VOLTAREN) 1 %  GEL Apply 2 g topically 4 (four) times daily. Patient taking differently: Apply 2 g topically 4 (four) times daily as needed (pain).  12/06/17   [name], MD  DULoxetine (CYMBALTA) 20 MG capsule TAKE 1 CAPSULE BY MOUTH DAILY. 05/10/18   [name], MD  ferrous sulfate 325 (65 FE) MG tablet Take 325 mg by mouth daily.    [provider]  gabapentin (NEURONTIN) 300 MG capsule TAKE 1 CAPSULE BY MOUTH IN THE MORNING AND AT LUNCH, TAKE 2 CAPSULES BY MOUTH AT NIGHT. Patient taking differently: Take 300-600 mg by mouth See admin instructions. Take 300 mg by mouth in the morning, 300 mg by mouth at lunch and 600 mg by mouth at bedtime 03/12/18   [name], MD  insulin aspart protamine - aspart (NOVOLOG 70/30 MIX) (70-30) 100 UNIT/ML FlexPen Inject 0.07 mLs (7 Units total) into the skin 2 (two) times daily with a meal. 03/21/18   [name], MD  Insulin Pen Needle (TRUEPLUS PEN NEEDLES) 32G X 4 MM MISC Use as directed twice daily with insulin 01/30/18   [name], MD  ixazomib citrate (NINLARO) 3 MG capsule Take 1 capsule by mouth on Day 1, Day 8 & D15 of each 28d cycle. Take on empty stomach 1hr before or 2hrs after food. Do not crush,chew,open 05/07/18   Brunetta Genera, MD  Multiple Vitamin (MULTIVITAMIN WITH MINERALS) TABS tablet Take 1 tablet by mouth daily. 11/12/16   Lorella Nimrod, MD  pravastatin (PRAVACHOL) 20 MG tablet Take 1 tablet (20 mg total) by mouth daily. 12/05/17   [name], MD  tadalafil (ADCIRCA/CIALIS) 20 MG tablet Take 20 mg by mouth daily as needed for erectile dysfunction.  04/16/18   [provider]  triamcinolone lotion (KENALOG) 0.1 % Apply 1 application topically 3 (three) times daily as needed. Patient taking differently: Apply 1 application topically 3 (three) times daily as needed (irritation).  03/08/18   Maryanna Shape, NP  vitamin B-12 (CYANOCOBALAMIN) 100 MCG tablet Take 100 mcg by mouth daily.    [provider]    Family History Family History  Problem Relation Age of Onset  . Cancer Neg Hx   . Colon cancer Neg Hx   . Colon polyps Neg Hx   . Esophageal cancer Neg Hx   . Rectal cancer Neg Hx   . Stomach cancer Neg Hx     Social History Social History   Tobacco Use  . Smoking status: Current Every Day Smoker    Packs/day: 0.25    Years: 50.00    Pack years: 12.50    Types: Cigarettes  . Smokeless tobacco: Never Used  . Tobacco comment: 1 ppwk-4-5 a day   Substance Use Topics  . Alcohol use: Yes    Alcohol/week: 1.0 standard drinks    Types:  1 Cans of beer per week    Comment: 1 every day-quit couple weeks ago  . Drug use: No     Allergies   Patient has no known allergies.   Review of Systems Review of Systems  Constitutional: Negative for fever.  HENT: Negative for ear pain and sore throat.   Eyes: Negative for pain and visual disturbance.  Respiratory: Negative for cough and shortness of breath.   Cardiovascular: Negative for chest pain.  Gastrointestinal: Negative for abdominal pain, constipation, diarrhea, nausea and vomiting.  Genitourinary: Negative for dysuria.  Musculoskeletal: Negative for back pain.  Skin: Negative for rash.  Neurological: Negative for dizziness, weakness, light-headedness and headaches.       Paresthesias  All other systems reviewed and are negative.    Physical Exam Updated Vital Signs BP 135/79   Pulse 84   Temp 98.5 F (36.9 C) (Oral)   Resp (!) 21   Ht '5\' 4"'$  (1.626 m)   Wt 71 kg   SpO2 97%   BMI 26.87 kg/m   Physical Exam Vitals signs and nursing note reviewed.  Constitutional:      General: He is not in acute distress.    Appearance: He is well-developed. He is not ill-appearing or toxic-appearing.  HENT:     Head: Normocephalic and atraumatic.     Mouth/Throat:     Mouth: Mucous membranes are moist.  Eyes:     Extraocular Movements: Extraocular movements intact.     Conjunctiva/sclera: Conjunctivae  normal.     Pupils: Pupils are equal, round, and reactive to light.  Neck:     Musculoskeletal: Neck supple.  Cardiovascular:     Rate and Rhythm: Normal rate and regular rhythm.     Heart sounds: Normal heart sounds. No murmur.  Pulmonary:     Effort: Pulmonary effort is normal.     Breath sounds: Normal breath sounds.  Abdominal:     Palpations: Abdomen is soft.     Tenderness: There is no abdominal tenderness.  Skin:    General: Skin is warm and dry.  Neurological:     Mental Status: He is alert.     Cranial Nerves: No cranial nerve deficit.     Comments: Mental Status:  Alert, thought content appropriate, able to give a coherent history. Speech fluent without evidence of aphasia. Able to follow 2 step commands without difficulty.  Motor:  Normal tone. 5/5 strength of BUE and BLE major muscle groups. Pt has trigger finger on right hand and therefor grip strength not able to be assessed, but individually pt has normal strength to all fingers on right hand. Sensory: light touch abnormal to distal pads of 1st, 2nd, 3rd, and 4th digits of the right hand. Sensation is WNL to remainder if fingers on the right hand and to remainder of BUE and BLE.  Psychiatric:        Mood and Affect: Mood normal.      ED Treatments / Results  Labs (all labs ordered are listed, but only abnormal results are displayed) Labs Reviewed - No data to display  EKG EKG Interpretation  Date/Time:  Wednesday July 24 2018 08:35:16 EST Ventricular Rate:  83 PR Interval:    QRS Duration: 92 QT Interval:  385 QTC Calculation: 453 R Axis:   65 Text Interpretation:  Sinus rhythm No acute changes No significant change since last tracing Confirmed by Varney Biles ([id number]) on 07/24/2018 9:04:43 AM   Radiology No results found.  Procedures Procedures (  including critical care time)  Medications Ordered in ED Medications - No data to display   Initial Impression / Assessment and Plan / ED Course    I have reviewed the triage vital signs and the nursing notes.  Pertinent labs & imaging results that were available during my care of the patient were reviewed by me and considered in my medical decision making (see chart for details).    Final Clinical Impressions(s) / ED Diagnoses   Final diagnoses:  Paresthesia   Presented paresthesias the right hand for the last 1.5 months. No change in symptoms today.     Symptoms present for the last 1.5 months.  Patient had previously been evaluated by his oncologist and was told that this is a side effect of the medication that he is currently on.  On exam patient does have abnormal sensation to distal pads of 1st, 2nd, 3rd, and 4th digits of the right hand.  The remainder of his sensory exam is within normal limits.  Strength appears normal to bilateral upper and lower extremities.  Neuro exam is non-concerning at this time for acute cause that would require further intervention or admission the hospital.  Suspect peripheral neuropathy secondary to his medication.   EKG ordered in triage with normal sinus rhythm and no acute changes.  Advised him to continue gabapentin.  Will give referral to outpatient neurology.  Have advised him to return to the ER for new or worsening symptoms the meantime.  He voices understanding of the plan and reasons return the ED.  All questions answered.  ED Discharge Orders         Ordered    Ambulatory referral to Neurology    Comments:  An appointment is requested in approximately: 2 weeks   07/24/18 0909           Rodney Booze, PA-C 07/24/18 0940    Varney Biles, MD 07/26/18 [phone]

## 2018-07-31 ENCOUNTER — Ambulatory Visit (INDEPENDENT_AMBULATORY_CARE_PROVIDER_SITE_OTHER): Payer: Medicare HMO | Admitting: Diagnostic Neuroimaging

## 2018-07-31 ENCOUNTER — Encounter: Payer: Self-pay | Admitting: Diagnostic Neuroimaging

## 2018-07-31 VITALS — BP 106/65 | HR 82 | Ht 64.0 in | Wt 156.4 lb

## 2018-07-31 DIAGNOSIS — R200 Anesthesia of skin: Secondary | ICD-10-CM | POA: Diagnosis not present

## 2018-07-31 DIAGNOSIS — R202 Paresthesia of skin: Secondary | ICD-10-CM | POA: Diagnosis not present

## 2018-07-31 NOTE — Patient Instructions (Signed)
-   check EMG (electrical nerve testing)  - wear carpal tunnel wrist splint at bedtime (right hand)

## 2018-07-31 NOTE — Progress Notes (Signed)
[facility]  PATIENT: Robert Sparks DOB: Jul 04, 1946  REFERRING CLINICIAN: ER  HISTORY FROM: patient  REASON FOR VISIT: new consult    HISTORICAL  CHIEF COMPLAINT:  Chief Complaint  Patient presents with  . New Patient (Initial Visit)    ED Referral  . Numbness in hands    Rm 7, alone.  R hand/ finger numbness, worse at night for the last 2 months.  Taking gabapentin and cymbalta.    HISTORY OF PRESENT ILLNESS:   73 year old male here for evaluation of right hand numbness.  Symptoms started around November 2019.  He describes numbness and tingling in his right hand, digits 1 through 4, worse at nighttime.  No problems in his left hand.  No problems in his feet or legs.  No problems in his face.  No weakness.  Patient has tried gabapentin without relief.    Patient has history of multiple myeloma and diabetes.  He is currently under treatment for both.   REVIEW OF SYSTEMS: Full 14 system review of systems performed and negative with exception of: None of sleep dizziness constipation.  ALLERGIES: No Known Allergies  HOME MEDICATIONS: Outpatient Medications Prior to Visit  Medication Sig Dispense Refill  . ACCU-CHEK AVIVA PLUS test strip 1 each by Other route See admin instructions. 100 each 12  . acyclovir (ZOVIRAX) 400 MG tablet Take 1 tablet (400 mg total) by mouth 2 (two) times daily. 60 tablet 5  . amLODipine (NORVASC) 5 MG tablet Take 1 tablet (5 mg total) by mouth daily. 90 tablet 3  . aspirin EC 81 MG tablet Take 1 tablet (81 mg total) by mouth daily. 90 tablet 3  . b complex vitamins capsule Take 1 capsule by mouth daily. 30 capsule 5  . Cholecalciferol (VITAMIN D) 2000 units CAPS Take 2,000 Units by mouth daily.    . DULoxetine (CYMBALTA) 20 MG capsule TAKE 1 CAPSULE BY MOUTH DAILY. 30 capsule 2  . ferrous sulfate 325 (65 FE) MG tablet Take 325 mg by mouth daily.    [name] gabapentin (NEURONTIN) 300 MG capsule TAKE 1 CAPSULE BY MOUTH IN THE MORNING  AND AT LUNCH, TAKE 2 CAPSULES BY MOUTH AT NIGHT. (Patient taking differently: Take 300-600 mg by mouth See admin instructions. Take 300 mg by mouth in the morning, 300 mg by mouth at lunch and 600 mg by mouth at bedtime) 120 capsule 4  . insulin aspart protamine - aspart (NOVOLOG 70/30 MIX) (70-30) 100 UNIT/ML FlexPen Inject 0.07 mLs (7 Units total) into the skin 2 (two) times daily with a meal. 15 mL 6  . Insulin Pen Needle (TRUEPLUS PEN NEEDLES) 32G X 4 MM MISC Use as directed twice daily with insulin 100 each 3  . ixazomib citrate (NINLARO) 3 MG capsule Take 1 capsule by mouth on Day 1, Day 8 & D15 of each 28d cycle. Take on empty stomach 1hr before or 2hrs after food. Do not crush,chew,open 3 capsule 5  . Multiple Vitamin (MULTIVITAMIN WITH MINERALS) TABS tablet Take 1 tablet by mouth daily. 60 tablet 2  . pravastatin (PRAVACHOL) 20 MG tablet Take 1 tablet (20 mg total) by mouth daily. 90 tablet 3  . tadalafil (ADCIRCA/CIALIS) 20 MG tablet Take 20 mg by mouth daily as needed for erectile dysfunction.   0  . triamcinolone lotion (KENALOG) 0.1 % Apply 1 application topically 3 (three) times daily as needed. (Patient taking differently: Apply 1 application topically 3 (three) times daily as needed (irritation). ) 60  mL 2  . vitamin B-12 (CYANOCOBALAMIN) 100 MCG tablet Take 100 mcg by mouth daily.    [name] acetaminophen-codeine (TYLENOL #3) 300-30 MG tablet Take 1 tablet by mouth every 8 (eight) hours as needed for moderate pain. (Patient not taking: Reported on 07/31/2018) 30 tablet 0  . diclofenac sodium (VOLTAREN) 1 % GEL Apply 2 g topically 4 (four) times daily. (Patient not taking: Reported on 07/31/2018) 100 g 2   No facility-administered medications prior to visit.     PAST MEDICAL HISTORY: Past Medical History:  Diagnosis Date  . Anemia   . Arthritis    shoulders,feet   . Cataract    per eye dr -has appt 5-11  . CKD (chronic kidney disease), stage III ([place])   . Elevated PSA   . History of  ketoacidosis    03-29-2015   . History of sepsis    07-25-2013  non-compliant w/ medication  . Hyperlipidemia   . Hypertension   . Nocturia   . Peripheral neuropathy   . Prostate cancer ([place])   . Type 2 diabetes mellitus with insulin therapy ([place])     PAST SURGICAL HISTORY: Past Surgical History:  Procedure Laterality Date  . CIRCUMCISION  1990's  . PROSTATE BIOPSY N/A 12/21/2015   Procedure: BIOPSY TRANSRECTAL ULTRASONIC PROSTATE (TUBP);  Surgeon: Festus Aloe, MD;  Location: [facility];  Service: Urology;  Laterality: N/A;    FAMILY HISTORY: Family History  Problem Relation Age of Onset  . Kidney disease Mother   . Cancer Neg Hx   . Colon cancer Neg Hx   . Colon polyps Neg Hx   . Esophageal cancer Neg Hx   . Rectal cancer Neg Hx   . Stomach cancer Neg Hx     SOCIAL HISTORY: Social History   Socioeconomic History  . Marital status: Single    Spouse name: Not on file  . Number of children: Not on file  . Years of education: Not on file  . Highest education level: Not on file  Occupational History  . Not on file  Social Needs  . Financial resource strain: Not on file  . Food insecurity:    Worry: Not on file    Inability: Not on file  . Transportation needs:    Medical: Not on file    Non-medical: Not on file  Tobacco Use  . Smoking status: Current Every Day Smoker    Packs/day: 0.25    Years: 50.00    Pack years: 12.50    Types: Cigarettes  . Smokeless tobacco: Never Used  . Tobacco comment: 1 ppwk-4-5 a day   Substance and Sexual Activity  . Alcohol use: Yes    Alcohol/week: 1.0 standard drinks    Types: 1 Cans of beer per week    Comment: 1 every day-quit couple weeks ago  . Drug use: No  . Sexual activity: Not Currently  Lifestyle  . Physical activity:    Days per week: Not on file    Minutes per session: Not on file  . Stress: Not on file  Relationships  . Social connections:    Talks on phone: Not on file    Gets  together: Not on file    Attends religious service: Not on file    Active member of club or organization: Not on file    Attends meetings of clubs or organizations: Not on file    Relationship status: Not on file  . Intimate partner violence:  Fear of current or ex partner: Not on file    Emotionally abused: Not on file    Physically abused: Not on file    Forced sexual activity: Not on file  Other Topics Concern  . Not on file  Social History Narrative  . Not on file     PHYSICAL EXAM  GENERAL EXAM/CONSTITUTIONAL: Vitals:  Vitals:   07/31/18 0801  BP: 106/65  Pulse: 82  Weight: 156 lb 6.4 oz (70.9 kg)  Height: '5\' 4"'$  (1.626 m)     Body mass index is 26.85 kg/m. Wt Readings from Last 3 Encounters:  07/31/18 156 lb 6.4 oz (70.9 kg)  07/24/18 156 lb 8.4 oz (71 kg)  07/16/18 157 lb 1.6 oz (71.3 kg)     Patient is in no distress; well developed, nourished and groomed; neck is supple  POOR DENTITION  CARDIOVASCULAR:  Examination of carotid arteries is normal; no carotid bruits  Regular rate and rhythm, no murmurs  Examination of peripheral vascular system by observation and palpation is normal  EYES:  Ophthalmoscopic exam of optic discs and posterior segments is normal; no papilledema or hemorrhages  No exam data present  MUSCULOSKELETAL:  Gait, strength, tone, movements noted in Neurologic exam below  NEUROLOGIC: MENTAL STATUS:  No flowsheet data found.  awake, alert, oriented to person, place and time  recent and remote memory intact  normal attention and concentration  language fluent, comprehension intact, naming intact  fund of knowledge appropriate  CRANIAL NERVE:   2nd - no papilledema on fundoscopic exam  2nd, 3rd, 4th, 6th - pupils equal and reactive to light, visual fields full to confrontation, extraocular muscles intact, no nystagmus  5th - facial sensation symmetric  7th - facial strength symmetric  8th - hearing  intact  9th - palate elevates symmetrically, uvula midline  11th - shoulder shrug symmetric  12th - tongue protrusion midline  MOTOR:   normal bulk and tone, full strength in the BUE, BLE; EXCEPT ATROPHY OF BILATERAL APB; WEAKNESS OF RIGHT THUMB ABDUCTION  SENSORY:   normal and symmetric to light touch, pinprick, temperature, vibration  MILD TINEL'S ON RIGHT WRIST   COORDINATION:   finger-nose-finger, fine finger movements normal  REFLEXES:   deep tendon reflexes TRACE and symmetric; ABSENT AT ANKLES  GAIT/STATION:   narrow based gait     DIAGNOSTIC DATA (LABS, IMAGING, TESTING) - I reviewed patient records, labs, notes, testing and imaging myself where available.  Lab Results  Component Value Date   WBC 5.5 07/16/2018   HGB 11.2 (L) 07/16/2018   HCT 34.1 (L) 07/16/2018   MCV 100.3 (H) 07/16/2018   PLT 145 (L) 07/16/2018      Component Value Date/Time   NA 139 07/16/2018 1257   NA 138 12/06/2017 0920   NA 137 07/13/2017 0938   K 3.8 07/16/2018 1257   K 3.3 (L) 07/13/2017 0938   CL 104 07/16/2018 1257   CO2 25 07/16/2018 1257   CO2 25 07/13/2017 0938   GLUCOSE 61 (L) 07/16/2018 1257   GLUCOSE 148 (H) 07/13/2017 0938   BUN 28 (H) 07/16/2018 1257   BUN 42 (H) 12/06/2017 0920   BUN 23.8 07/13/2017 0938   CREATININE 1.62 (H) 07/16/2018 1257   CREATININE 1.7 (H) 07/13/2017 0938   CALCIUM 10.0 07/16/2018 1257   CALCIUM 9.3 07/13/2017 0938   PROT 7.3 07/16/2018 1257   PROT 7.3 12/06/2017 0920   PROT 6.8 07/13/2017 0938   ALBUMIN 4.1 07/16/2018 1257  ALBUMIN 4.8 12/06/2017 0920   ALBUMIN 3.8 07/13/2017 0938   AST 23 07/16/2018 1257   AST 14 07/13/2017 0938   ALT 20 07/16/2018 1257   ALT 13 07/13/2017 0938   ALKPHOS 96 07/16/2018 1257   ALKPHOS 119 07/13/2017 0938   BILITOT 0.5 07/16/2018 1257   BILITOT 0.45 07/13/2017 0938   GFRNONAA 42 (L) 07/16/2018 1257   GFRNONAA 41 (L) 06/21/2016 1047   GFRAA 49 (L) 07/16/2018 1257   GFRAA 48 (L) 06/21/2016  1047   Lab Results  Component Value Date   CHOL 204 (H) 03/21/2018   HDL 68 03/21/2018   LDLCALC 107 (H) 03/21/2018   TRIG 143 03/21/2018   CHOLHDL 3.0 03/21/2018   Lab Results  Component Value Date   HGBA1C 5.6 (A) 06/20/2018   Lab Results  Component Value Date   VITAMINB12 >2000 (H) 12/06/2017   Lab Results  Component Value Date   TSH 2.750 12/06/2017      ASSESSMENT AND PLAN  73 y.o. year old male here with RIGHT hand numbness (digits 1-4) since Nov 2019.    Dx: suspected right carpal tunnel syndrome   1. Numbness and tingling in right hand     PLAN:  - EMG/NCS - wrist splint at bedtime  Orders Placed This Encounter  Procedures  . NCV with EMG(electromyography)   Return for for NCV/EMG.    Penni Bombard, MD 9/92/3414, 4:36 AM Certified in Neurology, Neurophysiology and Neuroimaging  [facility] [address] [place], [place] [address] [phone]

## 2018-08-01 MED FILL — NINLARO 3 MG CAPSULE: 3 | 28 days supply | Qty: 3 | Fill #2

## 2018-08-05 MED FILL — NOVOLOG MIX 70-30 FLEXPEN S: (70-30) 100 | 32 days supply | Qty: 6 | Fill #8

## 2018-08-05 MED FILL — PRAVASTATIN SODIUM 20 MG TA: 20 | 90 days supply | Qty: 90 | Fill #3

## 2018-08-06 ENCOUNTER — Ambulatory Visit (INDEPENDENT_AMBULATORY_CARE_PROVIDER_SITE_OTHER): Payer: Medicare HMO | Admitting: Podiatry

## 2018-08-06 DIAGNOSIS — B351 Tinea unguium: Secondary | ICD-10-CM

## 2018-08-06 DIAGNOSIS — E1142 Type 2 diabetes mellitus with diabetic polyneuropathy: Secondary | ICD-10-CM

## 2018-08-06 DIAGNOSIS — M79674 Pain in right toe(s): Secondary | ICD-10-CM | POA: Diagnosis not present

## 2018-08-06 DIAGNOSIS — L84 Corns and callosities: Secondary | ICD-10-CM | POA: Diagnosis not present

## 2018-08-06 DIAGNOSIS — M79675 Pain in left toe(s): Secondary | ICD-10-CM | POA: Diagnosis not present

## 2018-08-06 NOTE — Patient Instructions (Signed)
Onychomycosis/Fungal Toenails  WHAT IS IT? An infection that lies within the keratin of your nail plate that is caused by a fungus.  WHY ME? Fungal infections affect all ages, sexes, races, and creeds.  There may be many factors that predispose you to a fungal infection such as age, coexisting medical conditions such as diabetes, or an autoimmune disease; stress, medications, fatigue, genetics, etc.  Bottom line: fungus thrives in a warm, moist environment and your shoes offer such a location.  IS IT CONTAGIOUS? Theoretically, yes.  You do not want to share shoes, nail clippers or files with someone who has fungal toenails.  Walking around barefoot in the same room or sleeping in the same bed is unlikely to transfer the organism.  It is important to realize, however, that fungus can spread easily from one nail to the next on the same foot.  HOW DO WE TREAT THIS?  There are several ways to treat this condition.  Treatment may depend on many factors such as age, medications, pregnancy, liver and kidney conditions, etc.  It is best to ask your doctor which options are available to you.  1. No treatment.   Unlike many other medical concerns, you can live with this condition.  However for many people this can be a painful condition and may lead to ingrown toenails or a bacterial infection.  It is recommended that you keep the nails cut short to help reduce the amount of fungal nail. 2. Topical treatment.  These range from herbal remedies to prescription strength nail lacquers.  About 40-50% effective, topicals require twice daily application for approximately 9 to 12 months or until an entirely new nail has grown out.  The most effective topicals are medical grade medications available through physicians offices. 3. Oral antifungal medications.  With an 80-90% cure rate, the most common oral medication requires 3 to 4 months of therapy and stays in your system for a year as the new nail grows out.  Oral  antifungal medications do require blood work to make sure it is a safe drug for you.  A liver function panel will be performed prior to starting the medication and after the first month of treatment.  It is important to have the blood work performed to avoid any harmful side effects.  In general, this medication safe but blood work is required. 4. Laser Therapy.  This treatment is performed by applying a specialized laser to the affected nail plate.  This therapy is noninvasive, fast, and non-painful.  It is not covered by insurance and is therefore, out of pocket.  The results have been very good with a 80-95% cure rate.  The [facility] is the only practice in the area to offer this therapy. 5. Permanent Nail Avulsion.  Removing the entire nail so that a new nail will not grow back.  Corns and Calluses Corns are small areas of thickened skin that occur on the top, sides, or tip of a toe. They contain a cone-shaped core with a point that can press on a nerve below. This causes pain.  Calluses are areas of thickened skin that can occur anywhere on the body, including the hands, fingers, palms, soles of the feet, and heels. Calluses are usually larger than corns. What are the causes? Corns and calluses are caused by rubbing (friction) or pressure, such as from shoes that are too tight or do not fit properly. What increases the risk? Corns are more likely to develop in people   who have misshapen toes (toe deformities), such as hammer toes. Calluses can occur with friction to any area of the skin. They are more likely to develop in people who:  Work with their hands.  Wear shoes that fit poorly, are too tight, or are high-heeled.  Have toe deformities. What are the signs or symptoms? Symptoms of a corn or callus include:  A hard growth on the skin.  Pain or tenderness under the skin.  Redness and swelling.  Increased discomfort while wearing tight-fitting shoes, if your feet are  affected. If a corn or callus becomes infected, symptoms may include:  Redness and swelling that gets worse.  Pain.  Fluid, blood, or pus draining from the corn or callus. How is this diagnosed? Corns and calluses may be diagnosed based on your symptoms, your medical history, and a physical exam. How is this treated? Treatment for corns and calluses may include:  Removing the cause of the friction or pressure. This may involve: ? Changing your shoes. ? Wearing shoe inserts (orthotics) or other protective layers in your shoes, such as a corn pad. ? Wearing gloves.  Applying medicine to the skin (topical medicine) to help soften skin in the hardened, thickened areas.  Removing layers of dead skin with a file to reduce the size of the corn or callus.  Removing the corn or callus with a scalpel or laser.  Taking antibiotic medicines, if your corn or callus is infected.  Having surgery, if a toe deformity is the cause. Follow these instructions at home:   Take over-the-counter and prescription medicines only as told by your health care provider.  If you were prescribed an antibiotic, take it as told by your health care provider. Do not stop taking it even if your condition starts to improve.  Wear shoes that fit well. Avoid wearing high-heeled shoes and shoes that are too tight or too loose.  Wear any padding, protective layers, gloves, or orthotics as told by your health care provider.  Soak your hands or feet and then use a file or pumice stone to soften your corn or callus. Do this as told by your health care provider.  Check your corn or callus every day for symptoms of infection. Contact a health care provider if you:  Notice that your symptoms do not improve with treatment.  Have redness or swelling that gets worse.  Notice that your corn or callus becomes painful.  Have fluid, blood, or pus coming from your corn or callus.  Have new symptoms. Summary  Corns are  small areas of thickened skin that occur on the top, sides, or tip of a toe.  Calluses are areas of thickened skin that can occur anywhere on the body, including the hands, fingers, palms, and soles of the feet. Calluses are usually larger than corns.  Corns and calluses are caused by rubbing (friction) or pressure, such as from shoes that are too tight or do not fit properly.  Treatment may include wearing any padding, protective layers, gloves, or orthotics as told by your health care provider. This information is not intended to replace advice given to you by your health care provider. Make sure you discuss any questions you have with your health care provider. Document Released: 04/01/2004 Document Revised: 05/09/2017 Document Reviewed: 05/09/2017 Elsevier Interactive Patient Education  2019 [employer]  

## 2018-08-08 ENCOUNTER — Ambulatory Visit: Payer: Medicare HMO | Admitting: Podiatry

## 2018-08-09 NOTE — Telephone Encounter (Signed)
Contacted and lvm informing pt that rx is ready for pick up at the front desk

## 2018-08-12 MED FILL — GABAPENTIN 300 MG CAPSULE: 300 | 30 days supply | Qty: 120 | Fill #4

## 2018-08-12 MED FILL — ACCU-CHEK AVIVA PLUS TEST S: 30 days supply | Qty: 100 | Fill #12

## 2018-08-16 ENCOUNTER — Encounter: Payer: Self-pay | Admitting: Podiatry

## 2018-08-16 NOTE — Progress Notes (Signed)
Subjective: Robert Sparks presents today with history of neuropathy with cc of painful, mycotic toenails.  Pain is aggravated when wearing enclosed shoe gear and relieved with periodic professional debridement.  Patient is taking gabapentin for his neuropathy.  He states he still has pain on his current dosage.  He states the pain is the worst symptom.  He continues to smoke cigarettes.  Robert Pier, MD is his PCP.  His last visit (June 20, 2018.  No Known Allergies  Objective:  Vascular Examination: Capillary refill time immediate x 10 digits Dorsalis pedis and Posterior tibial pulses palpable bilaterally  Digital hair x 10 digits was absent Skin temperature gradient WNL b/l  Dermatological Examination: Skin with normal turgor, texture and tone b/l  Toenails 1-5 b/l discolored, thick, dystrophic with subungual debris and pain with palpation to nailbeds due to thickness of nails.  Hyperkeratotic lesion sub-hallux IPJ right foot and medial first metatarsal head left foot.  There is tenderness to palpation noted bilaterally.  There is no erythema, no edema, no drainage, no flocculence noted.  Musculoskeletal: Muscle strength 5/5 to all muscle groups b/l  Neurological: Sensation with 10 gram monofilament is absent b/l Vibratory sensation absent b/l  Assessment: 1. Painful onychomycosis toenails 1-5 b/l 2. Callus right hallux and medial first metatarsal head left foot 3. NIDDM with neuropathy  Plan: 1. Toenails 1-5 b/l were debrided in length and girth without iatrogenic bleeding. 2. Calluses bilaterally were pared without incident. 3. We discussed his diabetes and neuropathy.  We had an in-depth discussion regarding diabetes, controlling blood sugar and smoking.  We discussed how diabetes affects the nerves and the microvascular system.  I explained to him the cigarette smoking does affect the circulation in his feet.  He related understanding. 4. We discussed his  neuropathy.  I advised him it would be best to discuss his gabapentin dosage with Dr. Wynetta Sparks and to take it as she instructed.  We did discuss topical neuropathy cream and he would like to try it. A prescription was sent to [facility] for peripheral neuropathy cream which consists of: Bupivacaine 1%, doxepin 3%, gabapentin 6%, pentoxifylline 3%, and Topimarate 1%.  He is to apply to his feet every 6-8 hours.  5. Patient to continue soft, supportive shoe gear 6. Patient to report any pedal injuries to medical professional  7. Follow up 3 months.  8. Patient/POA to call should there be a concern in the interim.

## 2018-08-21 ENCOUNTER — Other Ambulatory Visit: Payer: Self-pay | Admitting: Internal Medicine

## 2018-08-21 DIAGNOSIS — E1142 Type 2 diabetes mellitus with diabetic polyneuropathy: Secondary | ICD-10-CM

## 2018-08-21 DIAGNOSIS — M1711 Unilateral primary osteoarthritis, right knee: Secondary | ICD-10-CM

## 2018-08-22 ENCOUNTER — Emergency Department (HOSPITAL_COMMUNITY): Payer: Medicare HMO

## 2018-08-22 ENCOUNTER — Emergency Department (HOSPITAL_COMMUNITY)
Admission: EM | Admit: 2018-08-22 | Discharge: 2018-08-22 | Disposition: A | Discharge status: Home / Self Care | Payer: Medicare HMO | Attending: Emergency Medicine | Admitting: Emergency Medicine

## 2018-08-22 ENCOUNTER — Other Ambulatory Visit: Payer: Self-pay

## 2018-08-22 DIAGNOSIS — R220 Localized swelling, mass and lump, head: Secondary | ICD-10-CM | POA: Diagnosis not present

## 2018-08-22 DIAGNOSIS — I129 Hypertensive chronic kidney disease with stage 1 through stage 4 chronic kidney disease, or unspecified chronic kidney disease: Secondary | ICD-10-CM | POA: Insufficient documentation

## 2018-08-22 DIAGNOSIS — Z794 Long term (current) use of insulin: Secondary | ICD-10-CM | POA: Insufficient documentation

## 2018-08-22 DIAGNOSIS — Z79899 Other long term (current) drug therapy: Secondary | ICD-10-CM | POA: Insufficient documentation

## 2018-08-22 DIAGNOSIS — E1142 Type 2 diabetes mellitus with diabetic polyneuropathy: Secondary | ICD-10-CM | POA: Insufficient documentation

## 2018-08-22 DIAGNOSIS — F17210 Nicotine dependence, cigarettes, uncomplicated: Secondary | ICD-10-CM | POA: Diagnosis not present

## 2018-08-22 DIAGNOSIS — L03213 Periorbital cellulitis: Secondary | ICD-10-CM | POA: Insufficient documentation

## 2018-08-22 DIAGNOSIS — E1122 Type 2 diabetes mellitus with diabetic chronic kidney disease: Secondary | ICD-10-CM | POA: Diagnosis not present

## 2018-08-22 DIAGNOSIS — R600 Localized edema: Secondary | ICD-10-CM | POA: Diagnosis present

## 2018-08-22 DIAGNOSIS — N183 Chronic kidney disease, stage 3 (moderate): Secondary | ICD-10-CM | POA: Insufficient documentation

## 2018-08-22 LAB — CBC WITH DIFFERENTIAL/PLATELET
Abs Immature Granulocytes: 0 10*3/uL (ref 0.00–0.07)
Band Neutrophils: 1 %
Basophils Absolute: 0 10*3/uL (ref 0.0–0.1)
Basophils Relative: 0 %
EOS PCT: 6 %
Eosinophils Absolute: 0.2 10*3/uL (ref 0.0–0.5)
HCT: 25.6 % — ABNORMAL LOW (ref 39.0–52.0)
Hemoglobin: 8.2 g/dL — ABNORMAL LOW (ref 13.0–17.0)
Lymphocytes Relative: 13 %
Lymphs Abs: 0.4 10*3/uL — ABNORMAL LOW (ref 0.7–4.0)
MCH: 32.5 pg (ref 26.0–34.0)
MCHC: 32 g/dL (ref 30.0–36.0)
MCV: 101.6 fL — ABNORMAL HIGH (ref 80.0–100.0)
MONO ABS: 0 10*3/uL — AB (ref 0.1–1.0)
Monocytes Relative: 0 %
Neutro Abs: 2.6 10*3/uL (ref 1.7–7.7)
Neutrophils Relative %: 80 %
Platelets: 147 10*3/uL — ABNORMAL LOW (ref 150–400)
RBC: 2.52 MIL/uL — ABNORMAL LOW (ref 4.22–5.81)
RDW: 13.8 % (ref 11.5–15.5)
WBC: 3.2 10*3/uL — ABNORMAL LOW (ref 4.0–10.5)
nRBC: 0 % (ref 0.0–0.2)
nRBC: 0 /100 WBC

## 2018-08-22 LAB — BASIC METABOLIC PANEL
Anion gap: 13 (ref 5–15)
BUN: 17 mg/dL (ref 8–23)
CO2: 24 mmol/L (ref 22–32)
Calcium: 8.6 mg/dL — ABNORMAL LOW (ref 8.9–10.3)
Chloride: 102 mmol/L (ref 98–111)
Creatinine, Ser: 1.31 mg/dL — ABNORMAL HIGH (ref 0.61–1.24)
GFR calc Af Amer: >60 mL/min (ref >60)
GFR, EST NON AFRICAN AMERICAN: 55 mL/min — AB (ref >60)
Glucose, Bld: 148 mg/dL — ABNORMAL HIGH (ref 70–99)
Potassium: 3.7 mmol/L (ref 3.5–5.1)
Sodium: 139 mmol/L (ref 135–145)

## 2018-08-22 MED ORDER — CLINDAMYCIN PHOSPHATE 600 MG/50ML IV SOLN
600.0000 mg | Freq: Once | INTRAVENOUS | Status: AC
  Administered 2018-08-22: 600 mg via INTRAVENOUS
  Filled 2018-08-22: qty 50

## 2018-08-22 MED ORDER — CLINDAMYCIN HCL 300 MG PO CAPS: 300.0000 mg | ORAL_CAPSULE | Freq: Four times a day (QID) | ORAL | 0 refills | Status: DC

## 2018-08-22 MED ORDER — IOHEXOL 300 MG/ML  SOLN
75.0000 mL | Freq: Once | INTRAMUSCULAR | Status: AC | PRN
  Administered 2018-08-22: 75 mL via INTRAVENOUS

## 2018-08-22 MED FILL — CLINDAMYCIN HCL 300 MG CAP: 300 | 7 days supply | Qty: 28 | Fill #0

## 2018-08-22 MED FILL — DULoxetine HCL 20 MG CPEP: 20 | 30 days supply | Qty: 30 | Fill #0

## 2018-08-22 NOTE — ED Notes (Signed)
Pt returned from CT °

## 2018-08-22 NOTE — ED Notes (Signed)
Checked patient eyes patient was 20/20 in both eyes and left eye 20/20 and right eye 20/20

## 2018-08-22 NOTE — ED Notes (Signed)
Patient transported to CT 

## 2018-08-22 NOTE — ED Notes (Signed)
Pt alert and oriented in NAD. Pt verbalized understanding of discharge instructions. 

## 2018-08-22 NOTE — ED Triage Notes (Signed)
Pt endorses right eye redness/swelling x 1 week. Denies pain or visual changes. VSS. Pt has hx of bone cancer but states he is in remission.

## 2018-08-22 NOTE — ED Provider Notes (Signed)
[facility] EMERGENCY DEPARTMENT Provider Note   CSN: [id number] Arrival date & time: 08/22/18  0841     History   Chief Complaint Chief Complaint  Patient presents with  . Eye Problem    HPI Robert Sparks is a 73 y.o. male.  Pt presents to the ED today with redness and swelling around right eye.  Pt has had sx for a week and it's getting worse.  He denies any new vision problems.  No pain with EOM.  He denies drainage from the eye.  No f/c.     Past Medical History:  Diagnosis Date  . Anemia   . Arthritis    shoulders,feet   . Cataract    per eye dr -has appt 5-11  . CKD (chronic kidney disease), stage III ([place])   . Elevated PSA   . History of ketoacidosis    03-29-2015   . History of sepsis    07-25-2013  non-compliant w/ medication  . Hyperlipidemia   . Hypertension   . Nocturia   . Peripheral neuropathy   . Prostate cancer ([place])   . Type 2 diabetes mellitus with insulin therapy [facility])     Patient Active Problem List   Diagnosis Date Noted  . HCAP (healthcare-associated pneumonia) 05/02/2018  . Cough   . Primary osteoarthritis of right knee 01/07/2018  . Diabetic polyneuropathy associated with type 2 diabetes mellitus ([place]) 01/07/2018  . Vitamin B12 deficiency 09/04/2017  . Pre-ulcerative corn or callous 04/12/2017  . Cataract of both eyes 04/12/2017  . Tobacco dependence 04/12/2017  . Hypokalemia 03/23/2017  . Multiple myeloma in remission ([place]) 02/23/2017  . Oral leukoplakia 02/09/2017  . Lightheadedness 11/10/2016  . Tobacco abuse 06/21/2016  . Hyperlipidemia 06/21/2016  . CKD (chronic kidney disease) stage 3, GFR 30-59 ml/min ([facility]) 02/23/2016  . Malignant neoplasm of prostate ([place]) 02/21/2016  . Controlled type 2 diabetes mellitus with stage 3 chronic kidney disease, with long-term current use of insulin ([place]) 03/29/2015  . HTN (hypertension) 07/28/2013  . Anemia, chronic disease 07/26/2013  . Sepsis ([place]) 07/25/2013  .  ETOH abuse 07/25/2013    Past Surgical History:  Procedure Laterality Date  . CIRCUMCISION  1990's  . PROSTATE BIOPSY N/A 12/21/2015   Procedure: BIOPSY TRANSRECTAL ULTRASONIC PROSTATE (TUBP);  Surgeon: Festus Aloe, MD;  Location: [facility];  Service: Urology;  Laterality: N/A;        Home Medications    Prior to Admission medications   Medication Sig Start Date End Date Taking? Authorizing Provider  ACCU-CHEK AVIVA PLUS test strip 1 each by Other route See admin instructions. 08/24/17   [name], MD  acetaminophen-codeine (TYLENOL #3) 300-30 MG tablet TAKE 1 TABLET BY MOUTH EVERY 8 HOURS AS NEEDED FOR MODERATE PAIN 08/09/18   [name], MD  acyclovir (ZOVIRAX) 400 MG tablet Take 1 tablet (400 mg total) by mouth 2 (two) times daily. 04/08/18   Brunetta Genera, MD  amLODipine (NORVASC) 5 MG tablet Take 1 tablet (5 mg total) by mouth daily. 12/05/17   [name], MD  aspirin EC 81 MG tablet Take 1 tablet (81 mg total) by mouth daily. 10/02/16   Maren Reamer, MD  b complex vitamins capsule Take 1 capsule by mouth daily. 07/16/18   Brunetta Genera, MD  Cholecalciferol (VITAMIN D) 2000 units CAPS Take 2,000 Units by mouth daily.    [provider]  clindamycin (CLEOCIN) 300 MG capsule Take 1  capsule (300 mg total) by mouth every 6 (six) hours. 08/22/18   Isla Pence, MD  diclofenac sodium (VOLTAREN) 1 % GEL Apply 2 g topically 4 (four) times daily. 12/06/17   [name], MD  DULoxetine (CYMBALTA) 20 MG capsule TAKE 1 CAPSULE BY MOUTH DAILY. 08/22/18   [name], MD  ferrous sulfate 325 (65 FE) MG tablet Take 325 mg by mouth daily.    [provider]  gabapentin (NEURONTIN) 300 MG capsule TAKE 1 CAPSULE BY MOUTH IN THE MORNING AND AT LUNCH, TAKE 2 CAPSULES BY MOUTH AT NIGHT. Patient taking differently: Take 300-600 mg by mouth See admin instructions. Take 300 mg by mouth in the morning, 300 mg by mouth  at lunch and 600 mg by mouth at bedtime 03/12/18   [name], MD  insulin aspart protamine - aspart (NOVOLOG 70/30 MIX) (70-30) 100 UNIT/ML FlexPen Inject 0.07 mLs (7 Units total) into the skin 2 (two) times daily with a meal. 03/21/18   [name], MD  Insulin Pen Needle (TRUEPLUS PEN NEEDLES) 32G X 4 MM MISC Use as directed twice daily with insulin 01/30/18   [name], MD  ixazomib citrate (NINLARO) 3 MG capsule Take 1 capsule by mouth on Day 1, Day 8 & D15 of each 28d cycle. Take on empty stomach 1hr before or 2hrs after food. Do not crush,chew,open 05/07/18   Brunetta Genera, MD  Multiple Vitamin (MULTIVITAMIN WITH MINERALS) TABS tablet Take 1 tablet by mouth daily. 11/12/16   Lorella Nimrod, MD  NON FORMULARY [facility]   CREAMS- #11    [provider]  pravastatin (PRAVACHOL) 20 MG tablet Take 1 tablet (20 mg total) by mouth daily. 12/05/17   [name], MD  tadalafil (ADCIRCA/CIALIS) 20 MG tablet Take 20 mg by mouth daily as needed for erectile dysfunction.  04/16/18   [provider]  triamcinolone lotion (KENALOG) 0.1 % Apply 1 application topically 3 (three) times daily as needed. Patient taking differently: Apply 1 application topically 3 (three) times daily as needed (irritation).  03/08/18   Maryanna Shape, NP  vitamin B-12 (CYANOCOBALAMIN) 100 MCG tablet Take 100 mcg by mouth daily.    [provider]    Family History Family History  Problem Relation Age of Onset  . Kidney disease Mother   . Cancer Neg Hx   . Colon cancer Neg Hx   . Colon polyps Neg Hx   . Esophageal cancer Neg Hx   . Rectal cancer Neg Hx   . Stomach cancer Neg Hx     Social History Social History   Tobacco Use  . Smoking status: Current Every Day Smoker    Packs/day: 0.25    Years: 50.00    Pack years: 12.50    Types: Cigarettes  . Smokeless tobacco: Never Used  . Tobacco comment: 1 ppwk-4-5 a day   Substance Use Topics  .  Alcohol use: Yes    Alcohol/week: 1.0 standard drinks    Types: 1 Cans of beer per week    Comment: 1 every day-quit couple weeks ago  . Drug use: No     Allergies   Patient has no known allergies.   Review of Systems Review of Systems  Eyes: Positive for redness.  All other systems reviewed and are negative.    Physical Exam Updated Vital Signs BP (!) 143/83   Pulse 73   Temp 98.3 F (36.8 C) (Oral)   Resp  18   SpO2 99%   Physical Exam Vitals signs and nursing note reviewed.  Constitutional:      Appearance: Normal appearance.  HENT:     Right Ear: External ear normal.     Left Ear: External ear normal.     Nose: Nose normal.     Mouth/Throat:     Mouth: Mucous membranes are moist.  Eyes:     Extraocular Movements: Extraocular movements intact.     Conjunctiva/sclera: Conjunctivae normal.     Pupils: Pupils are equal, round, and reactive to light.     Comments: Cellulitis to right eyelid and surrounding the eye.  Cardiovascular:     Rate and Rhythm: Normal rate and regular rhythm.     Pulses: Normal pulses.     Heart sounds: Normal heart sounds.  Pulmonary:     Effort: Pulmonary effort is normal.     Breath sounds: Normal breath sounds.  Abdominal:     General: Abdomen is flat. Bowel sounds are normal.     Palpations: Abdomen is soft.  Musculoskeletal: Normal range of motion.  Skin:    General: Skin is warm.     Capillary Refill: Capillary refill takes less than 2 seconds.  Neurological:     General: No focal deficit present.     Mental Status: He is alert and oriented to person, place, and time.  Psychiatric:        Mood and Affect: Mood normal.        Behavior: Behavior normal.      ED Treatments / Results  Labs (all labs ordered are listed, but only abnormal results are displayed) Labs Reviewed  BASIC METABOLIC PANEL - Abnormal; Notable for the following components:      Result Value   Glucose, Bld 148 (*)    Creatinine, Ser 1.31 (*)     Calcium 8.6 (*)    GFR calc non Af Amer 55 (*)    All other components within normal limits  CBC WITH DIFFERENTIAL/PLATELET - Abnormal; Notable for the following components:   WBC 3.2 (*)    RBC 2.52 (*)    Hemoglobin 8.2 (*)    HCT 25.6 (*)    MCV 101.6 (*)    Platelets 147 (*)    Lymphs Abs 0.4 (*)    Monocytes Absolute 0.0 (*)    All other components within normal limits    EKG None  Radiology Ct Orbits W Contrast  Result Date: 08/22/2018 CLINICAL DATA:  73 year old male with face and orbits soft tissue swelling. On oral chemotherapy currently for multiple myeloma. EXAM: CT ORBITS WITH CONTRAST TECHNIQUE: Multidetector CT images was performed according to the standard protocol following intravenous contrast administration. CONTRAST:  38m OMNIPAQUE IOHEXOL 300 MG/ML  SOLN COMPARISON:  PET-CT 03/09/2017. FINDINGS: Orbits: Orbital walls are intact. There is forehead and right greater than left superficial periorbital soft tissue thickening and stranding. Both preseptal spaces are affected. But both globes and postseptal orbits soft tissues appear normal. Negative CT appearance of the cavernous sinus and suprasellar cistern. Soft tissue swelling abates in the premalar spaces. No soft tissue gas. No drainable fluid collection. Other osseous structures: Heterogeneous bone mineralization throughout the visible skull and face. Maxillary dentition appears absent. No fracture or lytic bone destruction identified. Visualized sinuses: Chronic right sphenoid sinus disease with subtotal opacification and periosteal thickening. Paranasal sinuses and mastoids appears stable from the 2018 PET and otherwise well pneumatized. Soft tissues: Visible major vascular structures in the upper neck  and at the skull base appear patent. Calcified atherosclerosis at the skull base. Forehead and periorbital soft tissue swelling described above. There is partially visible nonspecific mild retropharyngeal effusion (series  3, image 7). There is generalized nonspecific soft tissue thickening of the visible pharynx. The visible parapharyngeal spaces, sublingual space, masticator and parotid spaces are within normal limits. No upper cervical lymphadenopathy identified. Limited intracranial: White matter hypodensity probably due to chronic small vessel disease including in the left deep white matter capsules. IMPRESSION: 1. Nonspecific generalized forehead and bilateral preseptal orbit soft tissue thickening. Perhaps this is Cellulitis. There is no soft tissue gas, abscess, or postseptal involvement. 2. Partially visible nonspecific generalized pharyngeal soft tissue thickening and mild retropharyngeal effusion. Perhaps this could reflect mucositis in the setting of chemotherapy, uncertain. In the setting of dysphagia or other neck complaints a dedicated Neck CT with IV contrast would be recommended. 3. Heterogeneous bone mineralization compatible with multiple myeloma. No fracture or lytic bone destruction identified. 4. Chronic right sphenoid sinusitis. 5. Cerebral white matter small vessel disease. Electronically Signed   By: Genevie Ann M.D.   On: 08/22/2018 11:57    Procedures Procedures (including critical care time)  Medications Ordered in ED Medications  clindamycin (CLEOCIN) IVPB 600 mg (0 mg Intravenous Stopped 08/22/18 1037)  iohexol (OMNIPAQUE) 300 MG/ML solution 75 mL (75 mLs Intravenous Contrast Given 08/22/18 1123)     Initial Impression / Assessment and Plan / ED Course  I have reviewed the triage vital signs and the nursing notes.  Pertinent labs & imaging results that were available during my care of the patient were reviewed by me and considered in my medical decision making (see chart for details).      Visual Acuity  Right Eye Distance: 20/20 Left Eye Distance:   Bilateral Distance:    Right Eye Near: R Near: 20/20 Left Eye Near:    Bilateral Near:  20/20  Pt's CT shows no signs of orbital  cellulitis.  VA is 20/20 in both eyes.    CT mentions possible pharyngeal soft tissue thickening, but pt has no pain or difficulty swallowing.   Pt given a dose of clindamycin in ED and will be d/c with oral clinda.  Return if worse.  F/u with pcp.  Final Clinical Impressions(s) / ED Diagnoses   Final diagnoses:  Periorbital cellulitis of right eye    ED Discharge Orders         Ordered    clindamycin (CLEOCIN) 300 MG capsule  Every 6 hours     08/22/18 1203           Isla Pence, MD 08/22/18 1206

## 2018-08-27 ENCOUNTER — Encounter: Payer: Self-pay | Admitting: Internal Medicine

## 2018-08-27 ENCOUNTER — Ambulatory Visit: Payer: Medicare HMO | Attending: Internal Medicine | Admitting: Internal Medicine

## 2018-08-27 ENCOUNTER — Other Ambulatory Visit: Payer: Self-pay

## 2018-08-27 VITALS — BP 131/80 | HR 81 | Temp 98.3°F | Resp 16 | Ht 66.0 in | Wt 159.6 lb

## 2018-08-27 DIAGNOSIS — R202 Paresthesia of skin: Secondary | ICD-10-CM | POA: Diagnosis not present

## 2018-08-27 DIAGNOSIS — N183 Chronic kidney disease, stage 3 unspecified: Secondary | ICD-10-CM

## 2018-08-27 DIAGNOSIS — C9000 Multiple myeloma not having achieved remission: Secondary | ICD-10-CM

## 2018-08-27 DIAGNOSIS — L03213 Periorbital cellulitis: Secondary | ICD-10-CM | POA: Diagnosis not present

## 2018-08-27 DIAGNOSIS — E1122 Type 2 diabetes mellitus with diabetic chronic kidney disease: Secondary | ICD-10-CM

## 2018-08-27 DIAGNOSIS — Z794 Long term (current) use of insulin: Secondary | ICD-10-CM | POA: Diagnosis not present

## 2018-08-27 LAB — GLUCOSE, POCT (MANUAL RESULT ENTRY): POC GLUCOSE: 123 mg/dL — AB (ref 70–99)

## 2018-08-27 MED FILL — NINLARO 3 MG CAPSULE: 3 | 28 days supply | Qty: 3 | Fill #3

## 2018-08-27 MED FILL — ACETAMINOPHEN/COD #3 TABLET: 300-30 | 20 days supply | Qty: 60 | Fill #0

## 2018-08-27 NOTE — Progress Notes (Signed)
Patient arrived ambulatory to the clinic with a need for a hospital follow up following being hospitalized for a right eye infection.  Patient stats he is on antibiotic.  Patient denies pain but does have the occasional itch.  Patient has been consistent with his medications with the exception of his tylenol 3.  Patient is A&O.  CBG is 123

## 2018-08-27 NOTE — Progress Notes (Signed)
F/u hospital Swelling around eye x 1 week, evaluated in ER 2/13, taking abx as prescribed, sx improved, swelling resolved, no redness, no pain, no change in vision, no f/c. Reports chronic fatigue, no change in sx recently, no c/p, no sob; has f/u appt w onc for mult myeloma  Tingling in fingertips, no change in sx noted, referred to neuro  Outpatient Encounter Medications as of 08/27/2018  Medication Sig  . ACCU-CHEK AVIVA PLUS test strip 1 each by Other route See admin instructions.  [name] acyclovir (ZOVIRAX) 400 MG tablet Take 1 tablet (400 mg total) by mouth 2 (two) times daily.  [name] amLODipine (NORVASC) 5 MG tablet Take 1 tablet (5 mg total) by mouth daily.  [name] aspirin EC 81 MG tablet Take 1 tablet (81 mg total) by mouth daily.  [name] b complex vitamins capsule Take 1 capsule by mouth daily.  . Cholecalciferol (VITAMIN D) 2000 units CAPS Take 2,000 Units by mouth daily.  . clindamycin (CLEOCIN) 300 MG capsule Take 1 capsule (300 mg total) by mouth every 6 (six) hours.  . DULoxetine (CYMBALTA) 20 MG capsule TAKE 1 CAPSULE BY MOUTH DAILY.  . ferrous sulfate 325 (65 FE) MG tablet Take 325 mg by mouth daily.  [name] gabapentin (NEURONTIN) 300 MG capsule TAKE 1 CAPSULE BY MOUTH IN THE MORNING AND AT LUNCH, TAKE 2 CAPSULES BY MOUTH AT NIGHT. (Patient taking differently: Take 300-600 mg by mouth See admin instructions. Take 300 mg by mouth in the morning, 300 mg by mouth at lunch and 600 mg by mouth at bedtime)  . insulin aspart protamine - aspart (NOVOLOG 70/30 MIX) (70-30) 100 UNIT/ML FlexPen Inject 0.07 mLs (7 Units total) into the skin 2 (two) times daily with a meal.  . Insulin Pen Needle (TRUEPLUS PEN NEEDLES) 32G X 4 MM MISC Use as directed twice daily with insulin  . ixazomib citrate (NINLARO) 3 MG capsule Take 1 capsule by mouth on Day 1, Day 8 & D15 of each 28d cycle. Take on empty stomach 1hr before or 2hrs after food. Do not crush,chew,open  . Multiple Vitamin (MULTIVITAMIN WITH MINERALS) TABS tablet  Take 1 tablet by mouth daily.  . NON FORMULARY [facility]   CREAMS- #11  . pravastatin (PRAVACHOL) 20 MG tablet Take 1 tablet (20 mg total) by mouth daily.  . tadalafil (ADCIRCA/CIALIS) 20 MG tablet Take 20 mg by mouth daily as needed for erectile dysfunction.   . triamcinolone lotion (KENALOG) 0.1 % Apply 1 application topically 3 (three) times daily as needed. (Patient taking differently: Apply 1 application topically 3 (three) times daily as needed (irritation). )  . vitamin B-12 (CYANOCOBALAMIN) 100 MCG tablet Take 100 mcg by mouth daily.  [name] acetaminophen-codeine (TYLENOL #3) 300-30 MG tablet TAKE 1 TABLET BY MOUTH EVERY 8 HOURS AS NEEDED FOR MODERATE PAIN (Patient not taking: Reported on 08/27/2018)  . diclofenac sodium (VOLTAREN) 1 % GEL Apply 2 g topically 4 (four) times daily. (Patient not taking: Reported on 08/27/2018)   No facility-administered encounter medications on file as of 08/27/2018.    Patient Active Problem List   Diagnosis Date Noted  . HCAP (healthcare-associated pneumonia) 05/02/2018  . Cough   . Primary osteoarthritis of right knee 01/07/2018  . Diabetic polyneuropathy associated with type 2 diabetes mellitus ([place]) 01/07/2018  . Vitamin B12 deficiency 09/04/2017  . Pre-ulcerative corn or callous 04/12/2017  . Cataract of both eyes 04/12/2017  . Tobacco dependence 04/12/2017  . Hypokalemia 03/23/2017  . Multiple myeloma in remission ([place])  02/23/2017  . Oral leukoplakia 02/09/2017  . Lightheadedness 11/10/2016  . Tobacco abuse 06/21/2016  . Hyperlipidemia 06/21/2016  . CKD (chronic kidney disease) stage 3, GFR 30-59 ml/min ([facility]) 02/23/2016  . Malignant neoplasm of prostate ([place]) 02/21/2016  . Controlled type 2 diabetes mellitus with stage 3 chronic kidney disease, with long-term current use of insulin ([place]) 03/29/2015  . HTN (hypertension) 07/28/2013  . Anemia, chronic disease 07/26/2013  . Sepsis ([place]) 07/25/2013  . ETOH abuse 07/25/2013    Past  Medical History:  Diagnosis Date  . Anemia   . Arthritis    shoulders,feet   . Cataract    per eye dr -has appt 5-11  . CKD (chronic kidney disease), stage III ([place])   . Elevated PSA   . History of ketoacidosis    03-29-2015   . History of sepsis    07-25-2013  non-compliant w/ medication  . Hyperlipidemia   . Hypertension   . Nocturia   . Peripheral neuropathy   . Prostate cancer ([place])   . Type 2 diabetes mellitus with insulin therapy [facility])     Past Surgical History:  Procedure Laterality Date  . CIRCUMCISION  1990's  . PROSTATE BIOPSY N/A 12/21/2015   Procedure: BIOPSY TRANSRECTAL ULTRASONIC PROSTATE (TUBP);  Surgeon: Festus Aloe, MD;  Location: [facility];  Service: Urology;  Laterality: N/A;   Social History   Socioeconomic History  . Marital status: Single    Spouse name: Not on file  . Number of children: Not on file  . Years of education: Not on file  . Highest education level: Not on file  Occupational History  . Not on file  Social Needs  . Financial resource strain: Not on file  . Food insecurity:    Worry: Not on file    Inability: Not on file  . Transportation needs:    Medical: Not on file    Non-medical: Not on file  Tobacco Use  . Smoking status: Current Every Day Smoker    Packs/day: 0.50    Years: 50.00    Pack years: 25.00    Types: Cigarettes  . Smokeless tobacco: Never Used  . Tobacco comment: 1 ppwk-4-5 a day   Substance and Sexual Activity  . Alcohol use: Yes    Alcohol/week: 1.0 standard drinks    Types: 1 Cans of beer per week    Comment: 1 every day-quit couple weeks ago  . Drug use: No  . Sexual activity: Not Currently  Lifestyle  . Physical activity:    Days per week: Not on file    Minutes per session: Not on file  . Stress: Not on file  Relationships  . Social connections:    Talks on phone: Not on file    Gets together: Not on file    Attends religious service: Not on file    Active member of club  or organization: Not on file    Attends meetings of clubs or organizations: Not on file    Relationship status: Not on file  . Intimate partner violence:    Fear of current or ex partner: Not on file    Emotionally abused: Not on file    Physically abused: Not on file    Forced sexual activity: Not on file  Other Topics Concern  . Not on file  Social History Narrative  . Not on file    BM in NAD BP 131/80 (BP Location: Right Arm, Patient Position: Sitting, Cuff  Size: Normal)   Pulse 81   Temp 98.3 F (36.8 C)   Resp 16   Ht _0  (1.676 m)   Wt 72.4 kg   SpO2 98%   BMI 25.76 kg/m   HEENT: [place]/at, perrl, eomi, conj clear, no edema/erythema/ttp periorbital area CV: rrr Lungs: cta A/p 1. Periorbital cellulitis of right eye Sx improved w abx, continue clinda, monitor sx  2. Controlled type 2 diabetes mellitus with stage 3 chronic kidney disease, with long-term current use of insulin (HCC) - Glucose (CBG) stable, monitor  3. Multiple myeloma, remission status unspecified (HCC) Decrease in wbc/hct on recent labs, prob cause of fatigue. Advised to f/u w oncologist  4. Paresthesias - prob due to MM, tx per onc.

## 2018-08-27 NOTE — Patient Instructions (Signed)
Follow-up with your oncologist as scheduled.

## 2018-08-28 MED FILL — DICLOFENAC SODIUM 1% GEL: 1 | 12 days supply | Qty: 100 | Fill #1

## 2018-08-28 MED FILL — ACYCLOVIR 400 MG TABLET: 400 | 30 days supply | Qty: 60 | Fill #3

## 2018-08-29 ENCOUNTER — Encounter: Payer: Medicare HMO | Admitting: Diagnostic Neuroimaging

## 2018-08-30 ENCOUNTER — Telehealth: Payer: Self-pay | Admitting: *Deleted

## 2018-08-30 NOTE — Telephone Encounter (Signed)
FYI "[place] [phone]).  Need script changed for Triamcinolone 0.1% ordered by Mikey Bussing."  Confirmed ordered on 03-08-2018 with 2 refills.  No new order.   We will check with buyer and return call if needed."

## 2018-09-02 MED FILL — AMLODIPINE BESYLATE 5 MG TA: 5 | 90 days supply | Qty: 90 | Fill #2

## 2018-09-05 ENCOUNTER — Other Ambulatory Visit: Payer: Self-pay

## 2018-09-05 ENCOUNTER — Other Ambulatory Visit: Payer: Self-pay | Admitting: Internal Medicine

## 2018-09-05 MED ORDER — ACCU-CHEK AVIVA PLUS VI STRP: 1.0000 | ORAL_STRIP | 12 refills | Status: DC

## 2018-09-05 MED FILL — ACCU-CHEK AVIVA PLUS STRP: 30 days supply | Qty: 100 | Fill #0

## 2018-09-09 ENCOUNTER — Other Ambulatory Visit: Payer: Self-pay | Admitting: Medical Oncology

## 2018-09-11 ENCOUNTER — Inpatient Hospital Stay: Payer: Medicare HMO | Attending: Hematology and Oncology | Admitting: Hematology

## 2018-09-11 ENCOUNTER — Inpatient Hospital Stay: Payer: Medicare HMO

## 2018-09-11 ENCOUNTER — Telehealth: Payer: Self-pay | Admitting: Hematology

## 2018-09-11 VITALS — BP 141/74 | HR 83 | Temp 98.2°F | Resp 18 | Ht 66.0 in | Wt 160.2 lb

## 2018-09-11 DIAGNOSIS — C9001 Multiple myeloma in remission: Secondary | ICD-10-CM

## 2018-09-11 DIAGNOSIS — G629 Polyneuropathy, unspecified: Secondary | ICD-10-CM

## 2018-09-11 DIAGNOSIS — G5601 Carpal tunnel syndrome, right upper limb: Secondary | ICD-10-CM

## 2018-09-11 DIAGNOSIS — C9000 Multiple myeloma not having achieved remission: Secondary | ICD-10-CM

## 2018-09-11 LAB — CBC WITH DIFFERENTIAL/PLATELET
Abs Immature Granulocytes: 0.01 10*3/uL (ref 0.00–0.07)
Basophils Absolute: 0 10*3/uL (ref 0.0–0.1)
Basophils Relative: 1 %
Eosinophils Absolute: 0.4 10*3/uL (ref 0.0–0.5)
Eosinophils Relative: 9 %
HCT: 27.3 % — ABNORMAL LOW (ref 39.0–52.0)
Hemoglobin: 8.7 g/dL — ABNORMAL LOW (ref 13.0–17.0)
Immature Granulocytes: 0 %
Lymphocytes Relative: 11 %
Lymphs Abs: 0.5 10*3/uL — ABNORMAL LOW (ref 0.7–4.0)
MCH: 32.8 pg (ref 26.0–34.0)
MCHC: 31.9 g/dL (ref 30.0–36.0)
MCV: 103 fL — AB (ref 80.0–100.0)
Monocytes Absolute: 0.3 10*3/uL (ref 0.1–1.0)
Monocytes Relative: 6 %
Neutro Abs: 3.1 10*3/uL (ref 1.7–7.7)
Neutrophils Relative %: 73 %
Platelets: 118 10*3/uL — ABNORMAL LOW (ref 150–400)
RBC: 2.65 MIL/uL — ABNORMAL LOW (ref 4.22–5.81)
RDW: 15.1 % (ref 11.5–15.5)
WBC: 4.2 10*3/uL (ref 4.0–10.5)
nRBC: 0 % (ref 0.0–0.2)

## 2018-09-11 LAB — CMP (CANCER CENTER ONLY)
ALT: 22 U/L (ref 0–44)
AST: 34 U/L (ref 15–41)
Albumin: 3.7 g/dL (ref 3.5–5.0)
Alkaline Phosphatase: 51 U/L (ref 38–126)
Anion gap: 8 (ref 5–15)
BUN: 15 mg/dL (ref 8–23)
CO2: 25 mmol/L (ref 22–32)
Calcium: 9 mg/dL (ref 8.9–10.3)
Chloride: 104 mmol/L (ref 98–111)
Creatinine: 1.58 mg/dL — ABNORMAL HIGH (ref 0.61–1.24)
GFR, EST NON AFRICAN AMERICAN: 44 mL/min — AB (ref >60)
GFR, Est AFR Am: 51 mL/min — ABNORMAL LOW (ref >60)
Glucose, Bld: 124 mg/dL — ABNORMAL HIGH (ref 70–99)
Potassium: 3.8 mmol/L (ref 3.5–5.1)
Sodium: 137 mmol/L (ref 135–145)
Total Bilirubin: 0.4 mg/dL (ref 0.3–1.2)
Total Protein: 6.8 g/dL (ref 6.5–8.1)

## 2018-09-11 NOTE — Telephone Encounter (Signed)
Gave avs and calendar ° °

## 2018-09-11 NOTE — Progress Notes (Signed)
HEMATOLOGY/ONCOLOGY CLINIC NOTE  Date of Service: 09/11/2018  Patient Care Team: [name], MD as PCP - General (Internal Medicine) [name], MD (Inactive) as Consulting Physician (Hematology and Oncology)  CHIEF COMPLAINTS/PURPOSE OF CONSULTATION:  F/u for continued mx of Multiple Myeloma  Oncologic History:  73 y.o. male with diagnosis of IgA lambda active multiple myeloma, ISS Stage I. Active disease diagnosed based on presence of anemia, kidney insufficiency, and paraproteinemia with significant predominance of lambda light chains as well as significant elevation of IgA. Bone marrow biopsy confirmed presence of atypical monoclonal plasma cell process in the bone marrow comprising at least 7% of the cellularity. Based on the findings, patient was started on treatment with lenalidomide, bortezomib, and low-dose dexamethasone based on the anticipated tolerance by the patient. Lenalidomide was started at 10 mg daily based on creatinine clearance of 42, dexamethasone dose was reduced to 20 mg weekly based on patient's age. Treatment was complicated by rapid development of cutaneous rash attributed to lenalidomide, inaddition to decreased renal function and now patient has persistent severe anemia. Side-effects were attributed to Lenalidomide and lenalidomide was discontinued with subsequent recovery.  Patient has been receiving bortezomib weekly with low-dose dexamethasone in 4-day cycles.  Patient has completed induction systemic therapy for his disease with repeat bone marrow biopsy confirming complete response including no evidence of minimal residual disease by cytogenetics or FISH.  HISTORY OF PRESENTING ILLNESS:   Robert Sparks is a wonderful 73 y.o. male who has been referred to [place] by my colleague Dr Grace Isaac for evaluation and management of Multiple Myeloma. The pt reports that he is doing well overall.   The pt has been previously followed by my colleague  Dr Grace Isaac. The pt was treated with Revlimid, Velcade, and dexamethasone and was subsequently switched to Velcade and Dexamethasone after a Revlimid intolerance.   The pt reports some knee aches and skin soreness in his legs which is worse at night. He notes that he has had diabetic-related peripheral neuropathy. The pt has not seen [facility] yet for a discussion of a BM transplant. He continues taking Acyclovir and notes no difficulty taking maintenance Velcade.   He adds that he takes Gabapentin and sweats.  He notes that his neuropathy is currently stable and denies it worsening.   He notes that a pack of cigarettes lasts him for about 3 days.   Most recent lab results (12/14/17) of CBC and CMP  is as follows: all values are WNL except for RBC at 3.08, HGB at 10.1, HCT at 31.0, MCV at 100.6, Eosinophils Abs at 1.4k, Potassium at 3.4, Glucose at 156, Creatinine at 1.60. LDH 12/14/17 is 141 Beta 2 microglobulin 12/14/17 is 2.4  On review of systems, pt reports knee pain, night time LE skin soreness, peripheral neuropathy, eating well, strong appetite, intermittent sweating and denies fevers, chills, back pains, abdominal pains, and any other symptoms.   [name] DM type 2, HTN, CKD. On Social Hx the pt reports smoking a pack of cigarettes every 3 days.   [name] returns today regarding management and evaluation of his Multiple Myeloma. [name] was on 07/16/18. The pt reports that he is doing well overall.   The pt reports that he has developed new pain and weakness in his right hand into his wrist, denies neck pains, numbness or tingling in legs. He presented to the ED on 07/24/18  for this, was referred to neurology, missed his first appointment due to snow, and will establish care with neurology on 10/03/18. He notes that his pain gets worse in the cold. Denies trauma. Also endorses numbness in his fingertips on only his right  hand, which he notes started about 1-2 weeks ago.   The pt also presented to the ED on 08/22/18 where he was diagnosed with orbital cellulitis and was given Clidamycin.  The pt also notes some round, itchy dots on his right upper extremity.  Lab results today (09/11/18) of CBC w/diff and CMP is as follows: all values are WNL except for RBC at 2.65, HGB at 8.7, HCT at 27.3, MCV at 103.0, PLT at 118k, Lymphs abs at 500, Glucose at 124, Creatinine at 1.58, GFR at 51.  On review of systems, pt reports right hand and wrist pain, stable energy levels, recent cellulitis, and denies neck pain, shoulder pain, upper arm pain, cough, cold, abdominal pains, leg swelling, leg tingling or numbness, and any other symptoms.   MEDICAL HISTORY:  Past Medical History:  Diagnosis Date  . Anemia   . Arthritis    shoulders,feet   . Cataract    per eye dr -has appt 5-11  . CKD (chronic kidney disease), stage III ([place])   . Elevated PSA   . History of ketoacidosis    03-29-2015   . History of sepsis    07-25-2013  non-compliant w/ medication  . Hyperlipidemia   . Hypertension   . Nocturia   . Peripheral neuropathy   . Prostate cancer ([place])   . Type 2 diabetes mellitus with insulin therapy ([place])     SURGICAL HISTORY: Past Surgical History:  Procedure Laterality Date  . CIRCUMCISION  1990's  . PROSTATE BIOPSY N/A 12/21/2015   Procedure: BIOPSY TRANSRECTAL ULTRASONIC PROSTATE (TUBP);  Surgeon: Festus Aloe, MD;  Location: [facility];  Service: Urology;  Laterality: N/A;    SOCIAL HISTORY: Social History   Socioeconomic History  . Marital status: Single    Spouse name: Not on file  . Number of children: Not on file  . Years of education: Not on file  . Highest education level: Not on file  Occupational History  . Not on file  Social Needs  . Financial resource strain: Not on file  . Food insecurity:    Worry: Not on file    Inability: Not on file  . Transportation needs:     Medical: Not on file    Non-medical: Not on file  Tobacco Use  . Smoking status: Current Every Day Smoker    Packs/day: 0.50    Years: 50.00    Pack years: 25.00    Types: Cigarettes  . Smokeless tobacco: Never Used  . Tobacco comment: 1 ppwk-4-5 a day   Substance and Sexual Activity  . Alcohol use: Yes    Alcohol/week: 1.0 standard drinks    Types: 1 Cans of beer per week    Comment: 1 every day-quit couple weeks ago  . Drug use: No  . Sexual activity: Not Currently  Lifestyle  . Physical activity:    Days per week: Not on file    Minutes per session: Not on file  . Stress: Not on file  Relationships  . Social connections:    Talks on phone: Not on file    Gets together: Not on file    Attends religious service: Not on file    Active member of club or  organization: Not on file    Attends meetings of clubs or organizations: Not on file    Relationship status: Not on file  . Intimate partner violence:    Fear of current or ex partner: Not on file    Emotionally abused: Not on file    Physically abused: Not on file    Forced sexual activity: Not on file  Other Topics Concern  . Not on file  Social History Narrative  . Not on file    FAMILY HISTORY: Family History  Problem Relation Age of Onset  . Kidney disease Mother   . Cancer Neg Hx   . Colon cancer Neg Hx   . Colon polyps Neg Hx   . Esophageal cancer Neg Hx   . Rectal cancer Neg Hx   . Stomach cancer Neg Hx     ALLERGIES:  has No Known Allergies.  MEDICATIONS:  Current Outpatient Medications  Medication Sig Dispense Refill  . ACCU-CHEK AVIVA PLUS test strip 1 each by Other route See admin instructions. 100 each 12  . acetaminophen-codeine (TYLENOL #3) 300-30 MG tablet TAKE 1 TABLET BY MOUTH EVERY 8 HOURS AS NEEDED FOR MODERATE PAIN (Patient not taking: Reported on 08/27/2018) 60 tablet 0  . acyclovir (ZOVIRAX) 400 MG tablet Take 1 tablet (400 mg total) by mouth 2 (two) times daily. 60 tablet 5  .  amLODipine (NORVASC) 5 MG tablet Take 1 tablet (5 mg total) by mouth daily. 90 tablet 3  . aspirin EC 81 MG tablet Take 1 tablet (81 mg total) by mouth daily. 90 tablet 3  . b complex vitamins capsule Take 1 capsule by mouth daily. 30 capsule 5  . Cholecalciferol (VITAMIN D) 2000 units CAPS Take 2,000 Units by mouth daily.    . clindamycin (CLEOCIN) 300 MG capsule Take 1 capsule (300 mg total) by mouth every 6 (six) hours. 28 capsule 0  . diclofenac sodium (VOLTAREN) 1 % GEL Apply 2 g topically 4 (four) times daily. (Patient not taking: Reported on 08/27/2018) 100 g 2  . DULoxetine (CYMBALTA) 20 MG capsule TAKE 1 CAPSULE BY MOUTH DAILY. 30 capsule 2  . ferrous sulfate 325 (65 FE) MG tablet Take 325 mg by mouth daily.    [name] gabapentin (NEURONTIN) 300 MG capsule TAKE 1 CAPSULE BY MOUTH IN THE MORNING AND AT LUNCH, TAKE 2 CAPSULES BY MOUTH AT NIGHT. (Patient taking differently: Take 300-600 mg by mouth See admin instructions. Take 300 mg by mouth in the morning, 300 mg by mouth at lunch and 600 mg by mouth at bedtime) 120 capsule 4  . insulin aspart protamine - aspart (NOVOLOG 70/30 MIX) (70-30) 100 UNIT/ML FlexPen Inject 0.07 mLs (7 Units total) into the skin 2 (two) times daily with a meal. 15 mL 6  . Insulin Pen Needle (TRUEPLUS PEN NEEDLES) 32G X 4 MM MISC Use as directed twice daily with insulin 100 each 3  . ixazomib citrate (NINLARO) 3 MG capsule Take 1 capsule by mouth on Day 1, Day 8 & D15 of each 28d cycle. Take on empty stomach 1hr before or 2hrs after food. Do not crush,chew,open 3 capsule 5  . Multiple Vitamin (MULTIVITAMIN WITH MINERALS) TABS tablet Take 1 tablet by mouth daily. 60 tablet 2  . NON FORMULARY [facility]   CREAMS- #11    . pravastatin (PRAVACHOL) 20 MG tablet Take 1 tablet (20 mg total) by mouth daily. 90 tablet 3  . tadalafil (ADCIRCA/CIALIS) 20 MG tablet Take 20 mg by  mouth daily as needed for erectile dysfunction.   0  . triamcinolone lotion (KENALOG) 0.1 % Apply  1 application topically 3 (three) times daily as needed. (Patient taking differently: Apply 1 application topically 3 (three) times daily as needed (irritation). ) 60 mL 2  . vitamin B-12 (CYANOCOBALAMIN) 100 MCG tablet Take 100 mcg by mouth daily.     No current facility-administered medications for this visit.     REVIEW OF SYSTEMS:    A 10+ POINT REVIEW OF SYSTEMS WAS OBTAINED including neurology, dermatology, psychiatry, cardiac, respiratory, lymph, extremities, GI, GU, Musculoskeletal, constitutional, breasts, reproductive, HEENT.  All pertinent positives are noted in the HPI.  All others are negative.   PHYSICAL EXAMINATION: ECOG PERFORMANCE STATUS: 1 - Symptomatic but completely ambulatory  GENERAL:alert, in no acute distress and comfortable SKIN: no acute rashes, no significant lesions EYES: conjunctiva are pink and non-injected, sclera anicteric OROPHARYNX: MMM, no exudates, no oropharyngeal erythema or ulceration NECK: supple, no JVD LYMPH:  no palpable lymphadenopathy in the cervical, axillary or inguinal regions LUNGS: clear to auscultation b/l with normal respiratory effort HEART: regular rate & rhythm ABDOMEN:  normoactive bowel sounds , non tender, not distended. No palpable hepatosplenomegaly.  Extremity: no pedal edema PSYCH: alert & oriented x 3 with fluent speech NEURO: no focal motor/sensory deficits   LABORATORY DATA:  I have reviewed the data as listed  . CBC Latest Ref Rng & Units 09/11/2018 08/22/2018 07/16/2018  WBC 4.0 - 10.5 K/uL 4.2 3.2(L) 5.5  Hemoglobin 13.0 - 17.0 g/dL 8.7(L) 8.2(L) 11.2(L)  Hematocrit 39.0 - 52.0 % 27.3(L) 25.6(L) 34.1(L)  Platelets 150 - 400 K/uL 118(L) 147(L) 145(L)    . CMP Latest Ref Rng & Units 09/11/2018 08/22/2018 07/16/2018  Glucose 70 - 99 mg/dL 124(H) 148(H) 61(L)  BUN 8 - 23 mg/dL 15 17 28(H)  Creatinine 0.61 - 1.24 mg/dL 1.58(H) 1.31(H) 1.62(H)  Sodium 135 - 145 mmol/L 137 139 139  Potassium 3.5 - 5.1 mmol/L 3.8 3.7 3.8    Chloride 98 - 111 mmol/L 104 102 104  CO2 22 - 32 mmol/L _0 Calcium 8.9 - 10.3 mg/dL 9.0 8.6(L) 10.0  Total Protein 6.5 - 8.1 g/dL 6.8 - 7.3  Total Bilirubin 0.3 - 1.2 mg/dL 0.4 - 0.5  Alkaline Phos 38 - 126 U/L 51 - 96  AST 15 - 41 U/L 34 - 23  ALT 0 - 44 U/L 22 - 20   08/29/17 BM Bx:    08/29/17 Cytogenetics:   03/13/17 Cytogenetics:          RADIOGRAPHIC STUDIES: I have personally reviewed the radiological images as listed and agreed with the findings in the report. Ct Orbits W Contrast  Result Date: 08/22/2018 CLINICAL DATA:  73 year old male with face and orbits soft tissue swelling. On oral chemotherapy currently for multiple myeloma. EXAM: CT ORBITS WITH CONTRAST TECHNIQUE: Multidetector CT images was performed according to the standard protocol following intravenous contrast administration. CONTRAST:  9m OMNIPAQUE IOHEXOL 300 MG/ML  SOLN COMPARISON:  PET-CT 03/09/2017. FINDINGS: Orbits: Orbital walls are intact. There is forehead and right greater than left superficial periorbital soft tissue thickening and stranding. Both preseptal spaces are affected. But both globes and postseptal orbits soft tissues appear normal. Negative CT appearance of the cavernous sinus and suprasellar cistern. Soft tissue swelling abates in the premalar spaces. No soft tissue gas. No drainable fluid collection. Other osseous structures: Heterogeneous bone mineralization throughout the visible skull and face. Maxillary dentition appears absent. No  fracture or lytic bone destruction identified. Visualized sinuses: Chronic right sphenoid sinus disease with subtotal opacification and periosteal thickening. Paranasal sinuses and mastoids appears stable from the 2018 PET and otherwise well pneumatized. Soft tissues: Visible major vascular structures in the upper neck and at the skull base appear patent. Calcified atherosclerosis at the skull base. Forehead and periorbital soft tissue swelling  described above. There is partially visible nonspecific mild retropharyngeal effusion (series 3, image 7). There is generalized nonspecific soft tissue thickening of the visible pharynx. The visible parapharyngeal spaces, sublingual space, masticator and parotid spaces are within normal limits. No upper cervical lymphadenopathy identified. Limited intracranial: White matter hypodensity probably due to chronic small vessel disease including in the left deep white matter capsules. IMPRESSION: 1. Nonspecific generalized forehead and bilateral preseptal orbit soft tissue thickening. Perhaps this is Cellulitis. There is no soft tissue gas, abscess, or postseptal involvement. 2. Partially visible nonspecific generalized pharyngeal soft tissue thickening and mild retropharyngeal effusion. Perhaps this could reflect mucositis in the setting of chemotherapy, uncertain. In the setting of dysphagia or other neck complaints a dedicated Neck CT with IV contrast would be recommended. 3. Heterogeneous bone mineralization compatible with multiple myeloma. No fracture or lytic bone destruction identified. 4. Chronic right sphenoid sinusitis. 5. Cerebral white matter small vessel disease. Electronically Signed   By: Genevie Ann M.D.   On: 08/22/2018 11:57    ASSESSMENT & PLAN:   73 y.o. male with   1. IgA Lambda Multiple Myeloma ISS Stage I -Currently in remission  Active disease was previously diagnosed based on presence of anemia, kidney insufficiency, and paraproteinemia with significant predominance of lambda light chains as well as significant elevation of IgA.  PLAN:  -Discussed pt labwork today, 09/11/18; HGB lower at 8.7 after recent cellulitis, PLT lower at 118k, WBC normal at 4.2k -09/11/18 SFLC and MMP are pending. -Last available SFLC from 06/10/18 revealed a slight increase in Kappa to 58.7 -Last available MMP from 06/10/18 revealed no M Protein. -Some concern for neuropathy, but do not feel acute change in right  hand and wrist pain is associated with neuropathy and Ninlaro -Could be element of carpel tunnel and possibly Raynaud's as well -As patient's myeloma has been very stable, given his new neurologic symptoms, will hold maintenance Ninlaro for 2 months until our next visit -Establish care with neurology and use wrist splint, avoid sleeping on right hand -Keep hands warm actively -Continue Gabapentin and Cymbalta per PCP -Recommend PCP rule out Peripheral arterial disease with [place] with ABIs given leg cramping -Continue Acyclovir -Recommended that the pt begin a Vitamin B complex -Will hold Xgeva at this time with dental work -Will see the pt back in 2 months   RTC with dr Irene Limbo in 2 months with labs   All of the patients questions were answered with apparent satisfaction. The patient knows to call the clinic with any problems, questions or concerns.  The total time spent in the appt was 25 minutes and more than 50% was on counseling and direct patient cares.     Sullivan Lone MD MS AAHIVMS [facility] Hematology/Oncology Physician [facility]  (Office):       [phone] (Work cell):  [phone] (Fax):           [phone]  09/11/2018 11:10 AM  I, Baldwin [place], am acting as a scribe for Dr. Sullivan Lone.   .I have reviewed the above documentation for accuracy and completeness, and I agree with the above. Suzan Slick  Juleen China MD

## 2018-09-12 LAB — KAPPA/LAMBDA LIGHT CHAINS
KAPPA FREE LGHT CHN: 56.8 mg/L — AB (ref 3.3–19.4)
Kappa, lambda light chain ratio: 2.44 — ABNORMAL HIGH (ref 0.26–1.65)
LAMDA FREE LIGHT CHAINS: 23.3 mg/L (ref 5.7–26.3)

## 2018-09-13 ENCOUNTER — Other Ambulatory Visit: Payer: Self-pay | Admitting: Medical Oncology

## 2018-09-13 DIAGNOSIS — L270 Generalized skin eruption due to drugs and medicaments taken internally: Secondary | ICD-10-CM

## 2018-09-13 MED ORDER — TRIAMCINOLONE ACETONIDE 0.1 % EX CREA: 1.0000 "application " | TOPICAL_CREAM | Freq: Three times a day (TID) | CUTANEOUS | 2 refills | Status: DC | PRN

## 2018-09-17 ENCOUNTER — Other Ambulatory Visit: Payer: Self-pay | Admitting: Internal Medicine

## 2018-09-17 DIAGNOSIS — E1122 Type 2 diabetes mellitus with diabetic chronic kidney disease: Secondary | ICD-10-CM

## 2018-09-17 DIAGNOSIS — N183 Chronic kidney disease, stage 3 (moderate): Principal | ICD-10-CM

## 2018-09-17 DIAGNOSIS — Z794 Long term (current) use of insulin: Principal | ICD-10-CM

## 2018-09-17 LAB — MULTIPLE MYELOMA PANEL, SERUM
Albumin SerPl Elph-Mcnc: 3.6 g/dL (ref 2.9–4.4)
Albumin/Glob SerPl: 1.3 (ref 0.7–1.7)
Alpha 1: 0.2 g/dL (ref 0.0–0.4)
Alpha2 Glob SerPl Elph-Mcnc: 0.8 g/dL (ref 0.4–1.0)
B-Globulin SerPl Elph-Mcnc: 0.8 g/dL (ref 0.7–1.3)
Gamma Glob SerPl Elph-Mcnc: 0.9 g/dL (ref 0.4–1.8)
Globulin, Total: 2.8 g/dL (ref 2.2–3.9)
IGG (IMMUNOGLOBIN G), SERUM: 931 mg/dL (ref 700–1600)
IgA: 118 mg/dL (ref 61–437)
IgM (Immunoglobulin M), Srm: 62 mg/dL (ref 20–172)
Total Protein ELP: 6.4 g/dL (ref 6.0–8.5)

## 2018-09-19 ENCOUNTER — Ambulatory Visit: Payer: Medicare HMO | Attending: Internal Medicine | Admitting: Internal Medicine

## 2018-09-19 ENCOUNTER — Encounter: Payer: Self-pay | Admitting: Internal Medicine

## 2018-09-19 ENCOUNTER — Other Ambulatory Visit: Payer: Self-pay

## 2018-09-19 VITALS — BP 140/70 | HR 65 | Temp 97.7°F | Resp 16 | Wt 153.8 lb

## 2018-09-19 DIAGNOSIS — F17210 Nicotine dependence, cigarettes, uncomplicated: Secondary | ICD-10-CM | POA: Diagnosis not present

## 2018-09-19 DIAGNOSIS — I10 Essential (primary) hypertension: Secondary | ICD-10-CM | POA: Diagnosis not present

## 2018-09-19 DIAGNOSIS — Z794 Long term (current) use of insulin: Secondary | ICD-10-CM | POA: Diagnosis not present

## 2018-09-19 DIAGNOSIS — F17200 Nicotine dependence, unspecified, uncomplicated: Secondary | ICD-10-CM | POA: Diagnosis not present

## 2018-09-19 DIAGNOSIS — E1122 Type 2 diabetes mellitus with diabetic chronic kidney disease: Secondary | ICD-10-CM | POA: Diagnosis not present

## 2018-09-19 DIAGNOSIS — G5691 Unspecified mononeuropathy of right upper limb: Secondary | ICD-10-CM | POA: Diagnosis not present

## 2018-09-19 DIAGNOSIS — R252 Cramp and spasm: Secondary | ICD-10-CM | POA: Diagnosis not present

## 2018-09-19 DIAGNOSIS — N183 Chronic kidney disease, stage 3 unspecified: Secondary | ICD-10-CM

## 2018-09-19 DIAGNOSIS — E785 Hyperlipidemia, unspecified: Secondary | ICD-10-CM

## 2018-09-19 LAB — GLUCOSE, POCT (MANUAL RESULT ENTRY): POC Glucose: 118 mg/dl — AB (ref 70–99)

## 2018-09-19 MED ORDER — INSULIN ASPART PROT & ASPART (70-30 MIX) 100 UNIT/ML PEN: 7.0000 [IU] | PEN_INJECTOR | Freq: Two times a day (BID) | SUBCUTANEOUS | 6 refills | Status: DC

## 2018-09-19 MED ORDER — GABAPENTIN 300 MG PO CAPS: ORAL_CAPSULE | ORAL | 6 refills | Status: DC

## 2018-09-19 MED ORDER — NICOTINE POLACRILEX 2 MG MT GUM: 2.0000 mg | CHEWING_GUM | OROMUCOSAL | 3 refills | Status: DC | PRN

## 2018-09-19 MED FILL — GABAPENTIN 300 MG CAPSULE: 300 | 90 days supply | Qty: 360 | Fill #0

## 2018-09-19 MED FILL — NOVOLOG MIX 70-30 FLEXPEN S: (70-30) 100 | 85 days supply | Qty: 12 | Fill #0

## 2018-09-19 NOTE — Patient Instructions (Addendum)
I have sent prescription to the pharmacy for the nicotine gum for you to use as needed.

## 2018-09-19 NOTE — Progress Notes (Signed)
Patient ID: Robert Sparks, male    DOB: 1945-12-03  MRN: [id number]  CC: Diabetes and Hypertension   Subjective: Robert Sparks is a 73 y.o. male who presents for chronic ds management His concerns today include: Patient with history of HTN, DM 2,HL,CKD stage3, tob dependence, EtOH abuse, prostate CA,OA knee,vit B 12defand multiple myeloma in remission  DM: checks BS TID.  Range 114-140.  Occasional hypoglycemia which he can feel.reports compliance with insulin 7 units twice a day.  Doing okay with eating habits Needs RF insulin Feet very sensitive to touch and stay sore.  Request RF on Gabapentin needed   HTN:  Taking Norvasc and limits salt.  Did not take as yet for today. No CP/SOB/LE edema  Still having numbness at tips of fingers and aching in RT hand at nights.  Saw neurology 07/2018 and EMG is planned for later this month.  He was given a wrist splint.  His oncologist Dr. Irene Limbo has put Ninlaro on hold for 2 mths  HL:  Taking Pravochol  Tob dep:  Smoking 1/3 pk a day.  Would like to quit. Nicotine patches caused nightmares.  Would like to try the nicotine gum.  Dr Irene Limbo wants ABI.  Patient reports occasional cramps in legs Still get "tiredness" in lower back when he walks.  Knees hurt when he first stands.  No pain in the legs or knees once he starts walking.   He is doing okay with the Tylenol #3 for arthritis pain in his knees and lower back  Patient Active Problem List   Diagnosis Date Noted  . HCAP (healthcare-associated pneumonia) 05/02/2018  . Cough   . Primary osteoarthritis of right knee 01/07/2018  . Diabetic polyneuropathy associated with type 2 diabetes mellitus ([place]) 01/07/2018  . Vitamin B12 deficiency 09/04/2017  . Pre-ulcerative corn or callous 04/12/2017  . Cataract of both eyes 04/12/2017  . Tobacco dependence 04/12/2017  . Hypokalemia 03/23/2017  . Multiple myeloma in remission ([place]) 02/23/2017  . Oral leukoplakia 02/09/2017  . Lightheadedness  11/10/2016  . Tobacco abuse 06/21/2016  . Hyperlipidemia 06/21/2016  . CKD (chronic kidney disease) stage 3, GFR 30-59 ml/min ([facility]) 02/23/2016  . Malignant neoplasm of prostate ([place]) 02/21/2016  . Controlled type 2 diabetes mellitus with stage 3 chronic kidney disease, with long-term current use of insulin ([place]) 03/29/2015  . HTN (hypertension) 07/28/2013  . Anemia, chronic disease 07/26/2013  . Sepsis ([place]) 07/25/2013  . ETOH abuse 07/25/2013     Current Outpatient Medications on File Prior to Visit  Medication Sig Dispense Refill  . ACCU-CHEK AVIVA PLUS test strip 1 each by Other route See admin instructions. 100 each 12  . acetaminophen-codeine (TYLENOL #3) 300-30 MG tablet TAKE 1 TABLET BY MOUTH EVERY 8 HOURS AS NEEDED FOR MODERATE PAIN (Patient not taking: Reported on 08/27/2018) 60 tablet 0  . acyclovir (ZOVIRAX) 400 MG tablet Take 1 tablet (400 mg total) by mouth 2 (two) times daily. 60 tablet 5  . amLODipine (NORVASC) 5 MG tablet Take 1 tablet (5 mg total) by mouth daily. 90 tablet 3  . aspirin EC 81 MG tablet Take 1 tablet (81 mg total) by mouth daily. 90 tablet 3  . b complex vitamins capsule Take 1 capsule by mouth daily. 30 capsule 5  . Cholecalciferol (VITAMIN D) 2000 units CAPS Take 2,000 Units by mouth daily.    . clindamycin (CLEOCIN) 300 MG capsule Take 1 capsule (300 mg total) by mouth every 6 (six) hours. [employer]  capsule 0  . diclofenac sodium (VOLTAREN) 1 % GEL Apply 2 g topically 4 (four) times daily. (Patient not taking: Reported on 08/27/2018) 100 g 2  . DULoxetine (CYMBALTA) 20 MG capsule TAKE 1 CAPSULE BY MOUTH DAILY. 30 capsule 2  . ferrous sulfate 325 (65 FE) MG tablet Take 325 mg by mouth daily.    . Insulin Pen Needle (TRUEPLUS PEN NEEDLES) 32G X 4 MM MISC Use as directed twice daily with insulin 100 each 3  . ixazomib citrate (NINLARO) 3 MG capsule Take 1 capsule by mouth on Day 1, Day 8 & D15 of each 28d cycle. Take on empty stomach 1hr before or 2hrs after food. Do  not crush,chew,open 3 capsule 5  . Multiple Vitamin (MULTIVITAMIN WITH MINERALS) TABS tablet Take 1 tablet by mouth daily. 60 tablet 2  . NON FORMULARY [facility]   CREAMS- #11    . pravastatin (PRAVACHOL) 20 MG tablet Take 1 tablet (20 mg total) by mouth daily. 90 tablet 3  . tadalafil (ADCIRCA/CIALIS) 20 MG tablet Take 20 mg by mouth daily as needed for erectile dysfunction.   0  . triamcinolone cream (KENALOG) 0.1 % Apply 1 application topically 3 (three) times daily as needed. 60 g 2  . vitamin B-12 (CYANOCOBALAMIN) 100 MCG tablet Take 100 mcg by mouth daily.     No current facility-administered medications on file prior to visit.     No Known Allergies  Social History   Socioeconomic History  . Marital status: Single    Spouse name: Not on file  . Number of children: Not on file  . Years of education: Not on file  . Highest education level: Not on file  Occupational History  . Not on file  Social Needs  . Financial resource strain: Not on file  . Food insecurity:    Worry: Not on file    Inability: Not on file  . Transportation needs:    Medical: Not on file    Non-medical: Not on file  Tobacco Use  . Smoking status: Current Every Day Smoker    Packs/day: 0.50    Years: 50.00    Pack years: 25.00    Types: Cigarettes  . Smokeless tobacco: Never Used  . Tobacco comment: 1 ppwk-4-5 a day   Substance and Sexual Activity  . Alcohol use: Yes    Alcohol/week: 1.0 standard drinks    Types: 1 Cans of beer per week    Comment: 1 every day-quit couple weeks ago  . Drug use: No  . Sexual activity: Not Currently  Lifestyle  . Physical activity:    Days per week: Not on file    Minutes per session: Not on file  . Stress: Not on file  Relationships  . Social connections:    Talks on phone: Not on file    Gets together: Not on file    Attends religious service: Not on file    Active member of club or organization: Not on file    Attends meetings of clubs or  organizations: Not on file    Relationship status: Not on file  . Intimate partner violence:    Fear of current or ex partner: Not on file    Emotionally abused: Not on file    Physically abused: Not on file    Forced sexual activity: Not on file  Other Topics Concern  . Not on file  Social History Narrative  . Not on file  Family History  Problem Relation Age of Onset  . Kidney disease Mother   . Cancer Neg Hx   . Colon cancer Neg Hx   . Colon polyps Neg Hx   . Esophageal cancer Neg Hx   . Rectal cancer Neg Hx   . Stomach cancer Neg Hx     Past Surgical History:  Procedure Laterality Date  . CIRCUMCISION  1990's  . PROSTATE BIOPSY N/A 12/21/2015   Procedure: BIOPSY TRANSRECTAL ULTRASONIC PROSTATE (TUBP);  Surgeon: Festus Aloe, MD;  Location: [facility];  Service: Urology;  Laterality: N/A;    ROS: Review of Systems Negative except as stated above  PHYSICAL EXAM: BP 140/70   Pulse 65   Temp 97.7 F (36.5 C) (Oral)   Resp 16   Wt 153 lb 12.8 oz (69.8 kg)   SpO2 100%   BMI 24.82 kg/m   Wt Readings from Last 3 Encounters:  09/19/18 153 lb 12.8 oz (69.8 kg)  09/11/18 160 lb 3.2 oz (72.7 kg)  08/27/18 159 lb 9.6 oz (72.4 kg)    Physical Exam General appearance - alert, well appearing, and in no distress Mental status - normal mood, behavior, speech, dress, motor activity, and thought processes Mouth - mucous membranes moist, pharynx normal without lesions Neck - supple, no significant adenopathy Chest - clear to auscultation, no wheezes, rales or rhonchi, symmetric air entry Heart - normal rate, regular rhythm, normal S1, S2, no murmurs, rubs, clicks or gallops Extremities - peripheral pulses normal, no pedal edema, no clubbing or cyanosis.  Dorsalis pedis, posterior tibialis, popliteal and femoral pulses are 3+ bilaterally. Diabetic Foot Exam - Simple   Simple Foot Form Visual Inspection See comments:  Yes Sensation Testing See  comments:  Yes Pulse Check Posterior Tibialis and Dorsalis pulse intact bilaterally:  Yes Comments Decreased sensation on leap exam on both feet.  He has callus on the lateral aspect of the ball of the left big toe and on the plantar surface of the right big toe    Point-of-care ABI:  RT 1.12, LT 1.19  Results for orders placed or performed in visit on 09/19/18  POCT glucose (manual entry)  Result Value Ref Range   POC Glucose 118 (A) 70 - 99 mg/dl   Lab Results  Component Value Date   HGBA1C 5.6 (A) 06/20/2018    CMP Latest Ref Rng & Units 09/11/2018 08/22/2018 07/16/2018  Glucose 70 - 99 mg/dL 124(H) 148(H) 61(L)  BUN 8 - 23 mg/dL 15 17 28(H)  Creatinine 0.61 - 1.24 mg/dL 1.58(H) 1.31(H) 1.62(H)  Sodium 135 - 145 mmol/L 137 139 139  Potassium 3.5 - 5.1 mmol/L 3.8 3.7 3.8  Chloride 98 - 111 mmol/L 104 102 104  CO2 22 - 32 mmol/L '25 24 25  ' Calcium 8.9 - 10.3 mg/dL 9.0 8.6(L) 10.0  Total Protein 6.5 - 8.1 g/dL 6.8 - 7.3  Total Bilirubin 0.3 - 1.2 mg/dL 0.4 - 0.5  Alkaline Phos 38 - 126 U/L 51 - 96  AST 15 - 41 U/L 34 - 23  ALT 0 - 44 U/L 22 - 20   Lipid Panel     Component Value Date/Time   CHOL 204 (H) 03/21/2018 1154   TRIG 143 03/21/2018 1154   HDL 68 03/21/2018 1154   CHOLHDL 3.0 03/21/2018 1154   CHOLHDL 3.6 06/21/2016 1047   VLDL 50 (H) 06/21/2016 1047   LDLCALC 107 (H) 03/21/2018 1154    CBC    Component Value  Date/Time   WBC 4.2 09/11/2018 0845   RBC 2.65 (L) 09/11/2018 0845   HGB 8.7 (L) 09/11/2018 0845   HGB 10.0 (L) 04/08/2018 1010   HGB 12.2 (L) 12/06/2017 0920   HGB 10.1 (L) 07/13/2017 0938   HCT 27.3 (L) 09/11/2018 0845   HCT 37.1 (L) 12/06/2017 0920   HCT 30.6 (L) 07/13/2017 0938   PLT 118 (L) 09/11/2018 0845   PLT 161 04/08/2018 1010   PLT 250 12/06/2017 0920   MCV 103.0 (H) 09/11/2018 0845   MCV 98 (H) 12/06/2017 0920   MCV 96.2 07/13/2017 0938   MCH 32.8 09/11/2018 0845   MCHC 31.9 09/11/2018 0845   RDW 15.1 09/11/2018 0845   RDW 14.1  12/06/2017 0920   RDW 14.4 07/13/2017 0938   LYMPHSABS 0.5 (L) 09/11/2018 0845   LYMPHSABS 0.8 (L) 07/13/2017 0938   MONOABS 0.3 09/11/2018 0845   MONOABS 0.4 07/13/2017 0938   EOSABS 0.4 09/11/2018 0845   EOSABS 0.5 07/13/2017 0938   EOSABS 0.8 (H) 11/21/2016 1416   BASOSABS 0.0 09/11/2018 0845   BASOSABS 0.0 07/13/2017 0938    ASSESSMENT AND PLAN: 1. Controlled type 2 diabetes mellitus with stage 3 chronic kidney disease, with long-term current use of insulin (H[facility]) At goal.  Continue current dose of insulin.  Continue healthy eating habits. - POCT glucose (manual entry) - insulin aspart protamine - aspart (NOVOLOG 70/30 MIX) (70-30) 100 UNIT/ML FlexPen; Inject 0.07 mLs (7 Units total) into the skin 2 (two) times daily with a meal.  Dispense: 15 mL; Refill: 6  2. Essential hypertension Not at goal.  He has not taken amlodipine as yet for today.  He will take when he returns home.  3. Tobacco dependence Advised to quit.  Discussed health risks associated with smoking.  He is wanting to try the nicotine gum.  Less than 5 minutes spent on counseling. - nicotine polacrilex (RA NICOTINE GUM) 2 MG gum; Take 1 each (2 mg total) by mouth as needed for smoking cessation (limit to no more than 30 pieces/24 hr).  Dispense: 100 tablet; Refill: 3  4. Hyperlipidemia, unspecified hyperlipidemia type Continue Pravachol.  5. Neuropathy of right hand Keep follow-up appointment with urologist.  6. Bilateral leg cramps Screening ABI done in the office today is normal.  Again patient cramps are occasional.  He has good peripheral pulses on exam.  I do not think he has PAD.      Patient was given the opportunity to ask questions.  Patient verbalized understanding of the plan and was able to repeat key elements of the plan.   Orders Placed This Encounter  Procedures  . POCT glucose (manual entry)     Requested Prescriptions   Signed Prescriptions Disp Refills  . insulin aspart protamine -  aspart (NOVOLOG 70/30 MIX) (70-30) 100 UNIT/ML FlexPen 15 mL 6    Sig: Inject 0.07 mLs (7 Units total) into the skin 2 (two) times daily with a meal.  . gabapentin (NEURONTIN) 300 MG capsule 120 capsule 6    Sig: Take 1 cap in a.m and a lunch and 2 at bedtime  . nicotine polacrilex (RA NICOTINE GUM) 2 MG gum 100 tablet 3    Sig: Take 1 each (2 mg total) by mouth as needed for smoking cessation (limit to no more than 30 pieces/24 hr).    Return in about 3 months (around 12/20/2018).  Karle Plumber, MD, FACP

## 2018-09-27 MED FILL — TRUEPLUS PEN NDL 32GX5/32: 32G X 4 MM | 50 days supply | Qty: 100 | Fill #2

## 2018-09-27 MED FILL — TRUEPLUS PEN NDL 32GX5/32": 32G X 4 MM | 50 days supply | Qty: 100 | Fill #2

## 2018-09-30 MED FILL — ACCU-CHEK AVIVA PLUS STRP: 23 days supply | Qty: 100 | Fill #1

## 2018-09-30 MED FILL — NINLARO 3 MG CAPSULE: 3 | 28 days supply | Qty: 3 | Fill #4

## 2018-10-03 ENCOUNTER — Encounter: Payer: Medicare HMO | Admitting: Diagnostic Neuroimaging

## 2018-10-04 DIAGNOSIS — C61 Malignant neoplasm of prostate: Secondary | ICD-10-CM | POA: Diagnosis not present

## 2018-10-07 DIAGNOSIS — N183 Chronic kidney disease, stage 3 (moderate): Secondary | ICD-10-CM | POA: Diagnosis not present

## 2018-10-11 DIAGNOSIS — N5201 Erectile dysfunction due to arterial insufficiency: Secondary | ICD-10-CM | POA: Diagnosis not present

## 2018-10-11 DIAGNOSIS — Z8546 Personal history of malignant neoplasm of prostate: Secondary | ICD-10-CM | POA: Diagnosis not present

## 2018-10-14 ENCOUNTER — Telehealth: Payer: Self-pay | Admitting: Diagnostic Neuroimaging

## 2018-10-14 NOTE — Telephone Encounter (Signed)
Pt called and wants to know what he can take for the numbness to go away due to his study being cancelled

## 2018-10-14 NOTE — Telephone Encounter (Signed)
LVM informing the patient that DrPenumali unfortunately doesn't have further recommendations. Advised he continue taking gabapentin, cymbalta, wear splints. Left number for further questions.

## 2018-10-14 NOTE — Telephone Encounter (Signed)
Unfortunately no other recommendations at this time. Continue gabapentin and cymbalta. -VRP

## 2018-10-15 DIAGNOSIS — C9000 Multiple myeloma not having achieved remission: Secondary | ICD-10-CM | POA: Diagnosis not present

## 2018-10-15 DIAGNOSIS — N183 Chronic kidney disease, stage 3 (moderate): Secondary | ICD-10-CM | POA: Diagnosis not present

## 2018-10-15 DIAGNOSIS — D631 Anemia in chronic kidney disease: Secondary | ICD-10-CM | POA: Diagnosis not present

## 2018-10-15 DIAGNOSIS — Z720 Tobacco use: Secondary | ICD-10-CM | POA: Diagnosis not present

## 2018-10-15 DIAGNOSIS — I129 Hypertensive chronic kidney disease with stage 1 through stage 4 chronic kidney disease, or unspecified chronic kidney disease: Secondary | ICD-10-CM | POA: Diagnosis not present

## 2018-10-15 DIAGNOSIS — N2581 Secondary hyperparathyroidism of renal origin: Secondary | ICD-10-CM | POA: Diagnosis not present

## 2018-10-16 DIAGNOSIS — N5201 Erectile dysfunction due to arterial insufficiency: Secondary | ICD-10-CM | POA: Diagnosis not present

## 2018-11-05 ENCOUNTER — Other Ambulatory Visit: Payer: Self-pay | Admitting: Internal Medicine

## 2018-11-05 ENCOUNTER — Encounter: Payer: Self-pay | Admitting: Podiatry

## 2018-11-05 ENCOUNTER — Other Ambulatory Visit: Payer: Self-pay

## 2018-11-05 ENCOUNTER — Ambulatory Visit (INDEPENDENT_AMBULATORY_CARE_PROVIDER_SITE_OTHER): Payer: Medicare HMO | Admitting: Podiatry

## 2018-11-05 VITALS — Temp 97.5°F

## 2018-11-05 DIAGNOSIS — E1142 Type 2 diabetes mellitus with diabetic polyneuropathy: Secondary | ICD-10-CM | POA: Diagnosis not present

## 2018-11-05 DIAGNOSIS — L84 Corns and callosities: Secondary | ICD-10-CM | POA: Diagnosis not present

## 2018-11-05 DIAGNOSIS — M79675 Pain in left toe(s): Secondary | ICD-10-CM | POA: Diagnosis not present

## 2018-11-05 DIAGNOSIS — B351 Tinea unguium: Secondary | ICD-10-CM | POA: Diagnosis not present

## 2018-11-05 DIAGNOSIS — M5136 Other intervertebral disc degeneration, lumbar region: Secondary | ICD-10-CM

## 2018-11-05 DIAGNOSIS — M79674 Pain in right toe(s): Secondary | ICD-10-CM | POA: Diagnosis not present

## 2018-11-05 DIAGNOSIS — M170 Bilateral primary osteoarthritis of knee: Secondary | ICD-10-CM

## 2018-11-05 DIAGNOSIS — M51369 Other intervertebral disc degeneration, lumbar region without mention of lumbar back pain or lower extremity pain: Secondary | ICD-10-CM

## 2018-11-05 MED FILL — DULoxetine HCL 20 MG CPEP: 20 | 30 days supply | Qty: 30 | Fill #1

## 2018-11-05 NOTE — Patient Instructions (Signed)
Diabetes Mellitus and Foot Care Foot care is an important part of your health, especially when you have diabetes. Diabetes may cause you to have problems because of poor blood flow (circulation) to your feet and legs, which can cause your skin to:  Become thinner and drier.  Break more easily.  Heal more slowly.  Peel and crack. You may also have nerve damage (neuropathy) in your legs and feet, causing decreased feeling in them. This means that you may not notice minor injuries to your feet that could lead to more serious problems. Noticing and addressing any potential problems early is the best way to prevent future foot problems. How to care for your feet Foot hygiene  Wash your feet daily with warm water and mild soap. Do not use hot water. Then, pat your feet and the areas between your toes until they are completely dry. Do not soak your feet as this can dry your skin.  Trim your toenails straight across. Do not dig under them or around the cuticle. File the edges of your nails with an emery board or nail file.  Apply a moisturizing lotion or petroleum jelly to the skin on your feet and to dry, brittle toenails. Use lotion that does not contain alcohol and is unscented. Do not apply lotion between your toes. Shoes and socks  Wear clean socks or stockings every day. Make sure they are not too tight. Do not wear knee-high stockings since they may decrease blood flow to your legs.  Wear shoes that fit properly and have enough cushioning. Always look in your shoes before you put them on to be sure there are no objects inside.  To break in new shoes, wear them for just a few hours a day. This prevents injuries on your feet. Wounds, scrapes, corns, and calluses  Check your feet daily for blisters, cuts, bruises, sores, and redness. If you cannot see the bottom of your feet, use a mirror or ask someone for help.  Do not cut corns or calluses or try to remove them with medicine.  If you  find a minor scrape, cut, or break in the skin on your feet, keep it and the skin around it clean and dry. You may clean these areas with mild soap and water. Do not clean the area with peroxide, alcohol, or iodine.  If you have a wound, scrape, corn, or callus on your foot, look at it several times a day to make sure it is healing and not infected. Check for: ? Redness, swelling, or pain. ? Fluid or blood. ? Warmth. ? Pus or a bad smell. General instructions  Do not cross your legs. This may decrease blood flow to your feet.  Do not use heating pads or hot water bottles on your feet. They may burn your skin. If you have lost feeling in your feet or legs, you may not know this is happening until it is too late.  Protect your feet from hot and cold by wearing shoes, such as at the beach or on hot pavement.  Schedule a complete foot exam at least once a year (annually) or more often if you have foot problems. If you have foot problems, report any cuts, sores, or bruises to your health care provider immediately. Contact a health care provider if:  You have a medical condition that increases your risk of infection and you have any cuts, sores, or bruises on your feet.  You have an injury that is not   healing.  You have redness on your legs or feet.  You feel burning or tingling in your legs or feet.  You have pain or cramps in your legs and feet.  Your legs or feet are numb.  Your feet always feel cold.  You have pain around a toenail. Get help right away if:  You have a wound, scrape, corn, or callus on your foot and: ? You have pain, swelling, or redness that gets worse. ? You have fluid or blood coming from the wound, scrape, corn, or callus. ? Your wound, scrape, corn, or callus feels warm to the touch. ? You have pus or a bad smell coming from the wound, scrape, corn, or callus. ? You have a fever. ? You have a red line going up your leg. Summary  Check your feet every day  for cuts, sores, red spots, swelling, and blisters.  Moisturize feet and legs daily.  Wear shoes that fit properly and have enough cushioning.  If you have foot problems, report any cuts, sores, or bruises to your health care provider immediately.  Schedule a complete foot exam at least once a year (annually) or more often if you have foot problems. This information is not intended to replace advice given to you by your health care provider. Make sure you discuss any questions you have with your health care provider. Document Released: 06/23/2000 Document Revised: 08/08/2017 Document Reviewed: 07/28/2016 [employer] Interactive Patient Education  2019 [employer]  Diabetic Neuropathy Diabetic neuropathy refers to nerve damage that is caused by diabetes (diabetes mellitus). Over time, people with diabetes can develop nerve damage throughout the body. There are several types of diabetic neuropathy:  Peripheral neuropathy. This is the most common type of diabetic neuropathy. It causes damage to nerves that carry signals between the spinal cord and other parts of the body (peripheral nerves). This usually affects nerves in the feet and legs first, and may eventually affect the hands and arms. The damage affects the ability to sense touch or temperature.  Autonomic neuropathy. This type causes damage to nerves that control involuntary functions (autonomic nerves). These nerves carry signals that control: ? Heartbeat. ? Body temperature. ? Blood pressure. ? Urination. ? Digestion. ? Sweating. ? Sexual function. ? Response to changing blood sugar (glucose) levels.  Focal neuropathy. This type of nerve damage affects one area of the body, such as an arm, a leg, or the face. The injury may involve one nerve or a small group of nerves. Focal neuropathy can be painful and unpredictable, and occurs most often in older adults with diabetes. This often develops suddenly, but usually improves over time  and does not cause long-term problems.  Proximal neuropathy. This type of nerve damage affects the nerves of the thighs, hips, buttocks, or legs. It causes severe pain, weakness, and muscle death (atrophy), usually in the thigh muscles. It is more common among older men and people who have type 2 diabetes. The length of recovery time may vary. What are the causes? Peripheral, autonomic, and focal neuropathies are caused by diabetes that is not well controlled with treatment. The cause of proximal neuropathy is not known, but it may be caused by inflammation related to uncontrolled blood glucose levels. What are the signs or symptoms? Peripheral neuropathy Peripheral neuropathy develops slowly over time. When the nerves of the feet and legs no longer work, you may experience:  Burning, stabbing, or aching pain in the legs or feet.  Pain or cramping in the  legs or feet.  Loss of feeling (numbness) and inability to feel pressure or pain in the feet. This can lead to: ? Thick calluses or sores on areas of constant pressure. ? Ulcers. ? Reduced ability to feel temperature changes.  Foot deformities.  Muscle weakness.  Loss of balance or coordination. Autonomic neuropathy The symptoms of autonomic neuropathy vary depending on which nerves are affected. Symptoms may include:  Problems with digestion, such as: ? Nausea or vomiting. ? Poor appetite. ? Bloating. ? Diarrhea or constipation. ? Trouble swallowing. ? Losing weight without trying to.  Problems with the heart, blood and lungs, such as: ? Dizziness, especially when standing up. ? Fainting. ? Shortness of breath. ? Irregular heartbeat.  Bladder problems, such as: ? Trouble starting or stopping urination. ? Leaking urine. ? Trouble emptying the bladder. ? Urinary tract infections (UTIs).  Problems with other body functions, such as: ? Sweat. You may sweat too much or too little. ? Temperature. You might get hot easily.  Or, you might feel cold more than usual. ? Sexual function. Men may not be able to get or maintain an erection. Women may have vaginal dryness and difficulty with arousal. Focal neuropathy Symptoms affect only one area of the body. Common symptoms include:  Numbness.  Tingling.  Burning pain.  Prickling feeling.  Very sensitive skin.  Weakness.  Inability to move (paralysis).  Muscle twitching.  Muscles getting smaller (wasting).  Poor coordination.  Double or blurred vision. Proximal neuropathy  Sudden, severe pain in the hip, thigh, or buttocks. Pain may spread from the back into the legs (sciatica).  Pain and numbness in the arms and legs.  Tingling.  Loss of bladder control or bowel control.  Weakness and wasting of thigh muscles.  Difficulty getting up from a seated position.  Abdominal swelling.  Unexplained weight loss. How is this diagnosed? Diagnosis usually involves reviewing your medical history and any symptoms you have. Diagnosis varies depending on the type of neuropathy your health care provider suspects. Peripheral neuropathy Your health care provider will check areas that are affected by your nervous system (neurologic exam), such as your reflexes, how you move, and what you can feel. You may have other tests, such as:  Blood tests.  Removal and examination of fluid that surrounds the spinal cord (lumbar puncture).  CT scan.  MRI.  A test to check the nerves that control muscles (electromyogram, EMG).  Tests of how quickly messages pass through your nerves (nerve conduction velocity tests).  Removal of a small piece of nerve to be examined under a microscope (biopsy). Autonomic neuropathy You may have tests, such as:  Tests to measure your blood pressure and heart rate. This may include monitoring you while you are safely secured to an exam table that moves you from a lying position to an upright position (table tilt test).  Breathing  tests to check your lungs.  Tests to check how food moves through the digestive system (gastric emptying tests).  Blood, sweat, or urine tests.  Ultrasound of your bladder.  Spinal fluid tests. Focal neuropathy This condition may be diagnosed with:  A neurologic exam.  CT scan.  MRI.  EMG.  Nerve conduction velocity tests. Proximal neuropathy There is no test to diagnose this type of neuropathy. You may have tests to rule out other possible causes of this type of neuropathy. Tests may include:  X-rays of your spine and lumbar region.  Lumbar puncture.  MRI. How is this treated? The  goal of treatment is to keep nerve damage from getting worse. The most important part of treatment is keeping your blood glucose level and your A1C level within your target range by following your diabetes management plan. Over time, maintaining lower blood glucose levels helps lessen symptoms. In some cases, you may need prescription pain medicine. Follow these instructions at home:  Lifestyle   Do not use any products that contain nicotine or tobacco, such as cigarettes and e-cigarettes. If you need help quitting, ask your health care provider.  Be physically active every day. Include strength training and balance exercises.  Follow a healthy meal plan.  Work with your health care provider to manage your blood pressure. General instructions  Follow your diabetes management plan as directed. ? Check your blood glucose levels as directed by your health care provider. ? Keep your blood glucose in your target range as directed by your health care provider. ? Have your A1C level checked at least two times a year, or as often as told by your health care provider.  Take over the counter and prescription medicines only as told by your health care provider. This includes insulin and diabetes medicine.  Do not drive or use heavy machinery while taking prescription pain medicines.  Check your  skin and feet every day for cuts, bruises, redness, blisters, or sores.  Keep all follow up visits as told by your health care provider. This is important. Contact a health care provider if:  You have burning, stabbing, or aching pain in your legs or feet.  You are unable to feel pressure or pain in your feet.  You develop problems with digestion, such as: ? Nausea. ? Vomiting. ? Bloating. ? Constipation. ? Diarrhea. ? Abdominal pain.  You have difficulty with urination, such as inability: ? To control when you urinate (incontinence). ? To completely empty the bladder (retention).  You have palpitations.  You feel dizzy, weak, or faint when you stand up. Get help right away if:  You cannot urinate.  You have sudden weakness or loss of coordination.  You have trouble speaking.  You have pain or pressure in your chest.  You have an irregular heart beat.  You have sudden inability to move a part of your body. Summary  Diabetic neuropathy refers to nerve damage that is caused by diabetes. It can affect nerves throughout the entire body, causing numbness and pain in the arms, legs, digestive tract, heart, and other body systems.  Keep your blood glucose level and your blood pressure in your target range, as directed by your health care provider. This can help prevent neuropathy from getting worse.  Check your skin and feet every day for cuts, bruises, redness, blisters, or sores.  Do not use any products that contain nicotine or tobacco, such as cigarettes and e-cigarettes. If you need help quitting, ask your health care provider. This information is not intended to replace advice given to you by your health care provider. Make sure you discuss any questions you have with your health care provider. Document Released: 09/04/2001 Document Revised: 08/08/2017 Document Reviewed: 07/31/2016 Elsevier Interactive Patient Education  2019 [employer] Onychomycosis/Fungal  Toenails  WHAT IS IT? An infection that lies within the keratin of your nail plate that is caused by a fungus.  WHY ME? Fungal infections affect all ages, sexes, races, and creeds.  There may be many factors that predispose you to a fungal infection such as age, coexisting medical conditions such as diabetes, or   an autoimmune disease; stress, medications, fatigue, genetics, etc.  Bottom line: fungus thrives in a warm, moist environment and your shoes offer such a location.  IS IT CONTAGIOUS? Theoretically, yes.  You do not want to share shoes, nail clippers or files with someone who has fungal toenails.  Walking around barefoot in the same room or sleeping in the same bed is unlikely to transfer the organism.  It is important to realize, however, that fungus can spread easily from one nail to the next on the same foot.  HOW DO WE TREAT THIS?  There are several ways to treat this condition.  Treatment may depend on many factors such as age, medications, pregnancy, liver and kidney conditions, etc.  It is best to ask your doctor which options are available to you.  1. No treatment.   Unlike many other medical concerns, you can live with this condition.  However for many people this can be a painful condition and may lead to ingrown toenails or a bacterial infection.  It is recommended that you keep the nails cut short to help reduce the amount of fungal nail. 2. Topical treatment.  These range from herbal remedies to prescription strength nail lacquers.  About 40-50% effective, topicals require twice daily application for approximately 9 to 12 months or until an entirely new nail has grown out.  The most effective topicals are medical grade medications available through physicians offices. 3. Oral antifungal medications.  With an 80-90% cure rate, the most common oral medication requires 3 to 4 months of therapy and stays in your system for a year as the new nail grows out.  Oral antifungal medications do  require blood work to make sure it is a safe drug for you.  A liver function panel will be performed prior to starting the medication and after the first month of treatment.  It is important to have the blood work performed to avoid any harmful side effects.  In general, this medication safe but blood work is required. 4. Laser Therapy.  This treatment is performed by applying a specialized laser to the affected nail plate.  This therapy is noninvasive, fast, and non-painful.  It is not covered by insurance and is therefore, out of pocket.  The results have been very good with a 80-95% cure rate.  [facility] is the only practice in the area to offer this therapy. 5. Permanent Nail Avulsion.  Removing the entire nail so that a new nail will not grow back. 

## 2018-11-07 ENCOUNTER — Other Ambulatory Visit: Payer: Self-pay | Admitting: *Deleted

## 2018-11-07 DIAGNOSIS — C9000 Multiple myeloma not having achieved remission: Secondary | ICD-10-CM

## 2018-11-07 MED ORDER — ACYCLOVIR 400 MG PO TABS: 400.0000 mg | ORAL_TABLET | Freq: Two times a day (BID) | ORAL | 5 refills | Status: DC

## 2018-11-07 MED FILL — ACYCLOVIR 400 MG TABLET: 400 | 30 days supply | Qty: 60 | Fill #0

## 2018-11-07 NOTE — Telephone Encounter (Signed)
Received faxed refill request from [facility], [address], [place] for: Acyclovir- 400 mg tab Sig: 1 tablet by mouth BID Quantity 60 Original RX written 11/12/2017 Last fill 08/28/2018 Request sent to MD

## 2018-11-08 MED FILL — ACETAMINOPHEN/COD #3 TABLET: 300-30 | 20 days supply | Qty: 60 | Fill #0

## 2018-11-08 NOTE — Progress Notes (Signed)
HEMATOLOGY/ONCOLOGY CLINIC NOTE  Date of Service: 11/11/2018  Patient Care Team: [name], MD as PCP - General (Internal Medicine) [name], MD (Inactive) as Consulting Physician (Hematology and Oncology)  CHIEF COMPLAINTS/PURPOSE OF CONSULTATION:  F/u for continued mx of Multiple Myeloma  Oncologic History:  73 y.o. male with diagnosis of IgA lambda active multiple myeloma, ISS Stage I. Active disease diagnosed based on presence of anemia, kidney insufficiency, and paraproteinemia with significant predominance of lambda light chains as well as significant elevation of IgA. Bone marrow biopsy confirmed presence of atypical monoclonal plasma cell process in the bone marrow comprising at least 7% of the cellularity. Based on the findings, patient was started on treatment with lenalidomide, bortezomib, and low-dose dexamethasone based on the anticipated tolerance by the patient. Lenalidomide was started at 10 mg daily based on creatinine clearance of 42, dexamethasone dose was reduced to 20 mg weekly based on patient's age. Treatment was complicated by rapid development of cutaneous rash attributed to lenalidomide, inaddition to decreased renal function and now patient has persistent severe anemia. Side-effects were attributed to Lenalidomide and lenalidomide was discontinued with subsequent recovery.  Patient has been receiving bortezomib weekly with low-dose dexamethasone in 4-day cycles.  Patient has completed induction systemic therapy for his disease with repeat bone marrow biopsy confirming complete response including no evidence of minimal residual disease by cytogenetics or FISH.  HISTORY OF PRESENTING ILLNESS:   Robert Sparks is a wonderful 73 y.o. male who has been referred to [place] by my colleague Dr Grace Isaac for evaluation and management of Multiple Myeloma. The pt reports that he is doing well overall.   The pt has been previously followed by my colleague  Dr Grace Isaac. The pt was treated with Revlimid, Velcade, and dexamethasone and was subsequently switched to Velcade and Dexamethasone after a Revlimid intolerance.   The pt reports some knee aches and skin soreness in his legs which is worse at night. He notes that he has had diabetic-related peripheral neuropathy. The pt has not seen [facility] yet for a discussion of a BM transplant. He continues taking Acyclovir and notes no difficulty taking maintenance Velcade.   He adds that he takes Gabapentin and sweats.  He notes that his neuropathy is currently stable and denies it worsening.   He notes that a pack of cigarettes lasts him for about 3 days.   Most recent lab results (12/14/17) of CBC and CMP  is as follows: all values are WNL except for RBC at 3.08, HGB at 10.1, HCT at 31.0, MCV at 100.6, Eosinophils Abs at 1.4k, Potassium at 3.4, Glucose at 156, Creatinine at 1.60. LDH 12/14/17 is 141 Beta 2 microglobulin 12/14/17 is 2.4  On review of systems, pt reports knee pain, night time LE skin soreness, peripheral neuropathy, eating well, strong appetite, intermittent sweating and denies fevers, chills, back pains, abdominal pains, and any other symptoms.   [name] DM type 2, HTN, CKD. On Social Hx the pt reports smoking a pack of cigarettes every 3 days.   [name] returns today regarding management and evaluation of his Multiple Myeloma. [name] was on 09/11/18. The pt reports that he is doing well overall.  The pt reports that he feels "about the same," overall as compared to our last visit. He notes that his neuropathy in his right fingertips feels similarly as compared to our last visit. He  endorses persisting, constant numbness. He endorses some numbness in the toes on both of his feet. He denies leg cramping. He has held [facility] for the last 2 months as we previously discussed. The pt notes that he was referred to  neurology but that they won't be able to see him for a while. The pt notes that he is staying active and denies any concerns for infections.   The pt notes that he is anticipating a full top denture when his dentist re-opens. He denies concerns for dental pains or needing additional dental intervention at this time. He denies mouth sores, fevers or chills.   Lab results today (11/11/18) of CBC w/diff and CMP is as follows: all values are WNL except for RBC at 3.10, HGB at 10.2, HCT at 31.6, MCV at 101.9, Eosinophils abs at 2.1k, CO2 at 21, Glucose at 160, Creatinine at 1.48, GFR at 55. 11/11/18 MMP and SFLC are pending  On review of systems, pt reports stable fingertip neuropathy, eating well, numbness in feet, staying active, and denies concerns for infections, dental pains, mouth sores, fevers, chills, and any other symptoms.   MEDICAL HISTORY:  Past Medical History:  Diagnosis Date  . Anemia   . Arthritis    shoulders,feet   . Cataract    per eye dr -has appt 5-11  . CKD (chronic kidney disease), stage III ([place])   . Elevated PSA   . History of ketoacidosis    03-29-2015   . History of sepsis    07-25-2013  non-compliant w/ medication  . Hyperlipidemia   . Hypertension   . Nocturia   . Peripheral neuropathy   . Prostate cancer ([place])   . Type 2 diabetes mellitus with insulin therapy ([place])     SURGICAL HISTORY: Past Surgical History:  Procedure Laterality Date  . CIRCUMCISION  1990's  . PROSTATE BIOPSY N/A 12/21/2015   Procedure: BIOPSY TRANSRECTAL ULTRASONIC PROSTATE (TUBP);  Surgeon: Festus Aloe, MD;  Location: [facility];  Service: Urology;  Laterality: N/A;    SOCIAL HISTORY: Social History   Socioeconomic History  . Marital status: Single    Spouse name: Not on file  . Number of children: Not on file  . Years of education: Not on file  . Highest education level: Not on file  Occupational History  . Not on file  Social Needs  . Financial  resource strain: Not on file  . Food insecurity:    Worry: Not on file    Inability: Not on file  . Transportation needs:    Medical: Not on file    Non-medical: Not on file  Tobacco Use  . Smoking status: Current Every Day Smoker    Packs/day: 0.50    Years: 50.00    Pack years: 25.00    Types: Cigarettes  . Smokeless tobacco: Never Used  . Tobacco comment: 1 ppwk-4-5 a day   Substance and Sexual Activity  . Alcohol use: Yes    Alcohol/week: 1.0 standard drinks    Types: 1 Cans of beer per week    Comment: 1 every day-quit couple weeks ago  . Drug use: No  . Sexual activity: Not Currently  Lifestyle  . Physical activity:    Days per week: Not on file    Minutes per session: Not on file  . Stress: Not on file  Relationships  . Social connections:    Talks on phone: Not on file    Gets together: Not on file  Attends religious service: Not on file    Active member of club or organization: Not on file    Attends meetings of clubs or organizations: Not on file    Relationship status: Not on file  . Intimate partner violence:    Fear of current or ex partner: Not on file    Emotionally abused: Not on file    Physically abused: Not on file    Forced sexual activity: Not on file  Other Topics Concern  . Not on file  Social History Narrative  . Not on file    FAMILY HISTORY: Family History  Problem Relation Age of Onset  . Kidney disease Mother   . Cancer Neg Hx   . Colon cancer Neg Hx   . Colon polyps Neg Hx   . Esophageal cancer Neg Hx   . Rectal cancer Neg Hx   . Stomach cancer Neg Hx     ALLERGIES:  has No Known Allergies.  MEDICATIONS:  Current Outpatient Medications  Medication Sig Dispense Refill  . ACCU-CHEK AVIVA PLUS test strip 1 each by Other route See admin instructions. 100 each 12  . acetaminophen-codeine (TYLENOL #3) 300-30 MG tablet TAKE 1 TABLET BY MOUTH EVERY 8 HOURS AS NEEDED FOR MODERATE PAIN. 60 tablet 0  . acyclovir (ZOVIRAX) 400 MG  tablet Take 1 tablet (400 mg total) by mouth 2 (two) times daily. 60 tablet 5  . amLODipine (NORVASC) 5 MG tablet Take 1 tablet (5 mg total) by mouth daily. 90 tablet 3  . aspirin EC 81 MG tablet Take 1 tablet (81 mg total) by mouth daily. 90 tablet 3  . b complex vitamins capsule Take 1 capsule by mouth daily. 30 capsule 5  . Cholecalciferol (VITAMIN D) 2000 units CAPS Take 2,000 Units by mouth daily.    . clindamycin (CLEOCIN) 300 MG capsule Take 1 capsule (300 mg total) by mouth every 6 (six) hours. 28 capsule 0  . diclofenac sodium (VOLTAREN) 1 % GEL Apply 2 g topically 4 (four) times daily. 100 g 2  . DULoxetine (CYMBALTA) 20 MG capsule TAKE 1 CAPSULE BY MOUTH DAILY. 30 capsule 2  . ferrous sulfate 325 (65 FE) MG tablet Take 325 mg by mouth daily.    [name] gabapentin (NEURONTIN) 300 MG capsule Take 1 cap in a.m and a lunch and 2 at bedtime 120 capsule 6  . insulin aspart protamine - aspart (NOVOLOG 70/30 MIX) (70-30) 100 UNIT/ML FlexPen Inject 0.07 mLs (7 Units total) into the skin 2 (two) times daily with a meal. 15 mL 6  . Insulin Pen Needle (TRUEPLUS PEN NEEDLES) 32G X 4 MM MISC Use as directed twice daily with insulin 100 each 3  . ixazomib citrate (NINLARO) 3 MG capsule Take 1 capsule by mouth on Day 1, Day 8 & D15 of each 28d cycle. Take on empty stomach 1hr before or 2hrs after food. Do not crush,chew,open 3 capsule 5  . Multiple Vitamin (MULTIVITAMIN WITH MINERALS) TABS tablet Take 1 tablet by mouth daily. 60 tablet 2  . nicotine polacrilex (RA NICOTINE GUM) 2 MG gum Take 1 each (2 mg total) by mouth as needed for smoking cessation (limit to no more than 30 pieces/24 hr). 100 tablet 3  . NON FORMULARY [place] [facility]   CREAMS- #11    . pravastatin (PRAVACHOL) 20 MG tablet Take 1 tablet (20 mg total) by mouth daily. 90 tablet 3  . tadalafil (ADCIRCA/CIALIS) 20 MG tablet Take 20 mg by mouth daily  as needed for erectile dysfunction.   0  . triamcinolone cream (KENALOG) 0.1 % Apply 1  application topically 3 (three) times daily as needed. 60 g 2  . vitamin B-12 (CYANOCOBALAMIN) 100 MCG tablet Take 100 mcg by mouth daily.     No current facility-administered medications for this visit.     REVIEW OF SYSTEMS:    A 10+ POINT REVIEW OF SYSTEMS WAS OBTAINED including neurology, dermatology, psychiatry, cardiac, respiratory, lymph, extremities, GI, GU, Musculoskeletal, constitutional, breasts, reproductive, HEENT.  All pertinent positives are noted in the HPI.  All others are negative.   PHYSICAL EXAMINATION: ECOG PERFORMANCE STATUS: 1 - Symptomatic but completely ambulatory  GENERAL:alert, in no acute distress and comfortable SKIN: no acute rashes, no significant lesions EYES: conjunctiva are pink and non-injected, sclera anicteric OROPHARYNX: MMM, no exudates, no oropharyngeal erythema or ulceration NECK: supple, no JVD LYMPH:  no palpable lymphadenopathy in the cervical, axillary or inguinal regions LUNGS: clear to auscultation b/l with normal respiratory effort HEART: regular rate & rhythm ABDOMEN:  normoactive bowel sounds , non tender, not distended. No palpable hepatosplenomegaly.  Extremity: no pedal edema PSYCH: alert & oriented x 3 with fluent speech NEURO: no focal motor/sensory deficits   LABORATORY DATA:  I have reviewed the data as listed  . CBC Latest Ref Rng & Units 11/11/2018 09/11/2018 08/22/2018  WBC 4.0 - 10.5 K/uL 6.0 4.2 3.2(L)  Hemoglobin 13.0 - 17.0 g/dL 10.2(L) 8.7(L) 8.2(L)  Hematocrit 39.0 - 52.0 % 31.6(L) 27.3(L) 25.6(L)  Platelets 150 - 400 K/uL 156 118(L) 147(L)    . CMP Latest Ref Rng & Units 11/11/2018 09/11/2018 08/22/2018  Glucose 70 - 99 mg/dL 160(H) 124(H) 148(H)  BUN 8 - 23 mg/dL '23 15 17  ' Creatinine 0.61 - 1.24 mg/dL 1.48(H) 1.58(H) 1.31(H)  Sodium 135 - 145 mmol/L 138 137 139  Potassium 3.5 - 5.1 mmol/L 3.8 3.8 3.7  Chloride 98 - 111 mmol/L 107 104 102  CO2 22 - 32 mmol/L 21(L) 25 24  Calcium 8.9 - 10.3 mg/dL 9.2 9.0 8.6(L)   Total Protein 6.5 - 8.1 g/dL 6.9 6.8 -  Total Bilirubin 0.3 - 1.2 mg/dL 0.4 0.4 -  Alkaline Phos 38 - 126 U/L 70 51 -  AST 15 - 41 U/L 23 34 -  ALT 0 - 44 U/L 30 22 -   08/29/17 BM Bx:    08/29/17 Cytogenetics:   03/13/17 Cytogenetics:          RADIOGRAPHIC STUDIES: I have personally reviewed the radiological images as listed and agreed with the findings in the report. No results found.  ASSESSMENT & PLAN:   73 y.o. male with   1. IgA Lambda Multiple Myeloma ISS Stage I -Currently in remission  Active disease was previously diagnosed based on presence of anemia, kidney insufficiency, and paraproteinemia with significant predominance of lambda light chains as well as significant elevation of IgA.  PLAN:  -Discussed pt labwork today, 11/11/18; WBC normal at 6.0k, HGB improved to 10.2, PLT normalized to 156k -11/11/18 MMP and SFLC are pending. Last available MMP from 09/11/18 did not observe an M spike. Last available SFLC from 09/11/18 revealed Kappa light chains at 56.8 and K:L ratio of 2.44 -Will continue to hold Ninlaro, given the patient's stability and absence of an M spike at this time. -Some concern for neuropathy, but do not feel acute change in right hand and wrist pain is associated with neuropathy and Ninlaro -Could be element of carpel tunnel and possibly Raynaud's  as well -Establish care with neurology and use wrist splint, avoid sleeping on right hand -Keep hands warm actively -Continue Gabapentin and Cymbalta per PCP -Recommend PCP rule out Peripheral arterial disease with [place] with ABIs given leg cramping -Continue Acyclovir -Recommended that the pt begin a Vitamin B complex -Will see the pt back in 2 months   RTC with Dr Irene Limbo with labs in 2 months   All of the patients questions were answered with apparent satisfaction. The patient knows to call the clinic with any problems, questions or concerns.  The total time spent in the appt was 20 minutes and more than  50% was on counseling and direct patient cares.    Sullivan Lone MD MS AAHIVMS [facility] Hematology/Oncology Physician [facility]  (Office):       [phone] (Work cell):  [phone] (Fax):           [phone]  11/11/2018 11:41 AM  I, Baldwin Jamaica, am acting as a scribe for Dr. Sullivan Lone.   .I have reviewed the above documentation for accuracy and completeness, and I agree with the above. Brunetta Genera MD

## 2018-11-08 NOTE — Telephone Encounter (Signed)
Took rx to [place] in pharmacy

## 2018-11-11 ENCOUNTER — Other Ambulatory Visit: Payer: Self-pay | Admitting: Pharmacist

## 2018-11-11 ENCOUNTER — Telehealth: Payer: Self-pay | Admitting: Hematology

## 2018-11-11 ENCOUNTER — Other Ambulatory Visit: Payer: Self-pay

## 2018-11-11 ENCOUNTER — Inpatient Hospital Stay: Payer: Medicare HMO | Attending: Hematology

## 2018-11-11 ENCOUNTER — Inpatient Hospital Stay (HOSPITAL_BASED_OUTPATIENT_CLINIC_OR_DEPARTMENT_OTHER): Payer: Medicare HMO | Admitting: Hematology

## 2018-11-11 VITALS — BP 138/71 | HR 77 | Temp 97.9°F | Resp 18 | Ht 66.0 in | Wt 156.7 lb

## 2018-11-11 DIAGNOSIS — C9001 Multiple myeloma in remission: Secondary | ICD-10-CM | POA: Diagnosis not present

## 2018-11-11 DIAGNOSIS — E119 Type 2 diabetes mellitus without complications: Secondary | ICD-10-CM | POA: Diagnosis not present

## 2018-11-11 DIAGNOSIS — Z794 Long term (current) use of insulin: Secondary | ICD-10-CM | POA: Insufficient documentation

## 2018-11-11 DIAGNOSIS — Z8546 Personal history of malignant neoplasm of prostate: Secondary | ICD-10-CM | POA: Diagnosis not present

## 2018-11-11 DIAGNOSIS — F17210 Nicotine dependence, cigarettes, uncomplicated: Secondary | ICD-10-CM | POA: Insufficient documentation

## 2018-11-11 DIAGNOSIS — I10 Essential (primary) hypertension: Secondary | ICD-10-CM

## 2018-11-11 DIAGNOSIS — Z79899 Other long term (current) drug therapy: Secondary | ICD-10-CM | POA: Insufficient documentation

## 2018-11-11 DIAGNOSIS — G629 Polyneuropathy, unspecified: Secondary | ICD-10-CM

## 2018-11-11 DIAGNOSIS — Z7982 Long term (current) use of aspirin: Secondary | ICD-10-CM | POA: Diagnosis not present

## 2018-11-11 LAB — CMP (CANCER CENTER ONLY)
ALT: 30 U/L (ref 0–44)
AST: 23 U/L (ref 15–41)
Albumin: 3.8 g/dL (ref 3.5–5.0)
Alkaline Phosphatase: 70 U/L (ref 38–126)
Anion gap: 10 (ref 5–15)
BUN: 23 mg/dL (ref 8–23)
CO2: 21 mmol/L — ABNORMAL LOW (ref 22–32)
Calcium: 9.2 mg/dL (ref 8.9–10.3)
Chloride: 107 mmol/L (ref 98–111)
Creatinine: 1.48 mg/dL — ABNORMAL HIGH (ref 0.61–1.24)
GFR, Est AFR Am: 55 mL/min — ABNORMAL LOW (ref >60)
GFR, Estimated: 47 mL/min — ABNORMAL LOW (ref >60)
Glucose, Bld: 160 mg/dL — ABNORMAL HIGH (ref 70–99)
Potassium: 3.8 mmol/L (ref 3.5–5.1)
Sodium: 138 mmol/L (ref 135–145)
Total Bilirubin: 0.4 mg/dL (ref 0.3–1.2)
Total Protein: 6.9 g/dL (ref 6.5–8.1)

## 2018-11-11 LAB — CBC WITH DIFFERENTIAL/PLATELET
Abs Immature Granulocytes: 0.01 10*3/uL (ref 0.00–0.07)
Basophils Absolute: 0 10*3/uL (ref 0.0–0.1)
Basophils Relative: 1 %
Eosinophils Absolute: 2.1 10*3/uL — ABNORMAL HIGH (ref 0.0–0.5)
Eosinophils Relative: 35 %
HCT: 31.6 % — ABNORMAL LOW (ref 39.0–52.0)
Hemoglobin: 10.2 g/dL — ABNORMAL LOW (ref 13.0–17.0)
Immature Granulocytes: 0 %
Lymphocytes Relative: 21 %
Lymphs Abs: 1.3 10*3/uL (ref 0.7–4.0)
MCH: 32.9 pg (ref 26.0–34.0)
MCHC: 32.3 g/dL (ref 30.0–36.0)
MCV: 101.9 fL — ABNORMAL HIGH (ref 80.0–100.0)
Monocytes Absolute: 0.4 10*3/uL (ref 0.1–1.0)
Monocytes Relative: 6 %
Neutro Abs: 2.2 10*3/uL (ref 1.7–7.7)
Neutrophils Relative %: 37 %
Platelets: 156 10*3/uL (ref 150–400)
RBC: 3.1 MIL/uL — ABNORMAL LOW (ref 4.22–5.81)
RDW: 14.6 % (ref 11.5–15.5)
WBC: 6 10*3/uL (ref 4.0–10.5)
nRBC: 0 % (ref 0.0–0.2)

## 2018-11-11 NOTE — Telephone Encounter (Signed)
Scheduled appt per 5/4 los.  A calendar will be mailed out.

## 2018-11-11 NOTE — Progress Notes (Signed)
Subjective: Robert Sparks is a 73 y.o. y.o. male who presents today for preventative foot care with history of diabetes and neuropathy.  Patient presents with cc of painful, discolored, thick toenails and painful plantar calluses which places him at risk and interfere with daily activities. Pain is aggravated when wearing enclosed shoe gear and relieved with periodic professional debridement.  [name], MD is his PCP.  Last visit September 19, 2018.   Current Outpatient Medications:  .  ACCU-CHEK AVIVA PLUS test strip, 1 each by Other route See admin instructions., Disp: 100 each, Rfl: 12 .  amLODipine (NORVASC) 5 MG tablet, Take 1 tablet (5 mg total) by mouth daily., Disp: 90 tablet, Rfl: 3 .  aspirin EC 81 MG tablet, Take 1 tablet (81 mg total) by mouth daily., Disp: 90 tablet, Rfl: 3 .  b complex vitamins capsule, Take 1 capsule by mouth daily., Disp: 30 capsule, Rfl: 5 .  Cholecalciferol (VITAMIN D) 2000 units CAPS, Take 2,000 Units by mouth daily., Disp: , Rfl:  .  clindamycin (CLEOCIN) 300 MG capsule, Take 1 capsule (300 mg total) by mouth every 6 (six) hours., Disp: 28 capsule, Rfl: 0 .  diclofenac sodium (VOLTAREN) 1 % GEL, Apply 2 g topically 4 (four) times daily., Disp: 100 g, Rfl: 2 .  DULoxetine (CYMBALTA) 20 MG capsule, TAKE 1 CAPSULE BY MOUTH DAILY., Disp: 30 capsule, Rfl: 2 .  ferrous sulfate 325 (65 FE) MG tablet, Take 325 mg by mouth daily., Disp: , Rfl:  .  gabapentin (NEURONTIN) 300 MG capsule, Take 1 cap in a.m and a lunch and 2 at bedtime, Disp: 120 capsule, Rfl: 6 .  insulin aspart protamine - aspart (NOVOLOG 70/30 MIX) (70-30) 100 UNIT/ML FlexPen, Inject 0.07 mLs (7 Units total) into the skin 2 (two) times daily with a meal., Disp: 15 mL, Rfl: 6 .  Insulin Pen Needle (TRUEPLUS PEN NEEDLES) 32G X 4 MM MISC, Use as directed twice daily with insulin, Disp: 100 each, Rfl: 3 .  ixazomib citrate (NINLARO) 3 MG capsule, Take 1 capsule by mouth on Day 1, Day 8 & D15 of each  28d cycle. Take on empty stomach 1hr before or 2hrs after food. Do not crush,chew,open, Disp: 3 capsule, Rfl: 5 .  Multiple Vitamin (MULTIVITAMIN WITH MINERALS) TABS tablet, Take 1 tablet by mouth daily., Disp: 60 tablet, Rfl: 2 .  nicotine polacrilex (RA NICOTINE GUM) 2 MG gum, Take 1 each (2 mg total) by mouth as needed for smoking cessation (limit to no more than 30 pieces/24 hr)., Disp: 100 tablet, Rfl: 3 .  NON FORMULARY, [place] [facility]   CREAMS- #11, Disp: , Rfl:  .  pravastatin (PRAVACHOL) 20 MG tablet, Take 1 tablet (20 mg total) by mouth daily., Disp: 90 tablet, Rfl: 3 .  tadalafil (ADCIRCA/CIALIS) 20 MG tablet, Take 20 mg by mouth daily as needed for erectile dysfunction. , Disp: , Rfl: 0 .  triamcinolone cream (KENALOG) 0.1 %, Apply 1 application topically 3 (three) times daily as needed., Disp: 60 g, Rfl: 2 .  vitamin B-12 (CYANOCOBALAMIN) 100 MCG tablet, Take 100 mcg by mouth daily., Disp: , Rfl:  .  acetaminophen-codeine (TYLENOL #3) 300-30 MG tablet, TAKE 1 TABLET BY MOUTH EVERY 8 HOURS AS NEEDED FOR MODERATE PAIN., Disp: 60 tablet, Rfl: 0 .  acyclovir (ZOVIRAX) 400 MG tablet, Take 1 tablet (400 mg total) by mouth 2 (two) times daily., Disp: 60 tablet, Rfl: 5  No Known Allergies  Objective:  Vascular Examination:  Capillary refill time immediate x10 digits.  Dorsalis pedis pulses palpable bilaterally.  Posterior tibial pulses palpable bilaterally.  Digital hair absent x10 digits.  Skin temperature gradient WNL b/l.  Dermatological Examination: Skin with normal turgor, texture and tone b/l.  Toenails 1-5 b/l discolored, thick, dystrophic with subungual debris and pain with palpation to nailbeds due to thickness of nails.  Hyperkeratotic lesion sub-hallux IPJ right foot and medial first metatarsal head left foot.  There is tenderness to palpation of both lesions.  No erythema, no edema, no drainage, no flocculence noted with either lesion.  Musculoskeletal: Muscle  strength 5/5 to all LE muscle groups  Neurological: Sensation absent with 10 gram monofilament.  Assessment: 1. Painful onychomycosis toenails 1-5 b/l 2.  Callus right hallux and medial first metatarsal head left foot 3.  NIDDM with neuropathy  Plan: 1. Continue diabetic foot care principles. Literature dispensed on today. 2. Toenails 1-5 b/l were debrided in length and girth without iatrogenic bleeding. 3. Hyperkeratotic lesion(s) pared right hallux and first metatarsal head left foot with sterile scalpel blade without incident. 4. Patient to continue soft, supportive shoe gear daily. 5. Patient to report any pedal injuries to medical professional immediately. 6. Follow up 3 months. 7. Patient/POA to call should there be a concern in the interim.

## 2018-11-12 LAB — KAPPA/LAMBDA LIGHT CHAINS
Kappa free light chain: 62.1 mg/L — ABNORMAL HIGH (ref 3.3–19.4)
Kappa, lambda light chain ratio: 2.47 — ABNORMAL HIGH (ref 0.26–1.65)
Lambda free light chains: 25.1 mg/L (ref 5.7–26.3)

## 2018-11-12 LAB — MULTIPLE MYELOMA PANEL, SERUM
Albumin SerPl Elph-Mcnc: 3.8 g/dL (ref 2.9–4.4)
Albumin/Glob SerPl: 1.6 (ref 0.7–1.7)
Alpha 1: 0.2 g/dL (ref 0.0–0.4)
Alpha2 Glob SerPl Elph-Mcnc: 0.8 g/dL (ref 0.4–1.0)
B-Globulin SerPl Elph-Mcnc: 0.7 g/dL (ref 0.7–1.3)
Gamma Glob SerPl Elph-Mcnc: 0.9 g/dL (ref 0.4–1.8)
Globulin, Total: 2.5 g/dL (ref 2.2–3.9)
IgA: 144 mg/dL (ref 61–437)
IgG (Immunoglobin G), Serum: 980 mg/dL (ref 603–1613)
IgM (Immunoglobulin M), Srm: 84 mg/dL (ref 20–172)
Total Protein ELP: 6.3 g/dL (ref 6.0–8.5)

## 2018-11-14 MED FILL — PRAVASTATIN SODIUM 20 MG TA: 20 | 60 days supply | Qty: 60 | Fill #4

## 2018-11-20 MED FILL — ACCU-CHEK AVIVA PLUS STRP: 23 days supply | Qty: 100 | Fill #2

## 2018-11-21 DIAGNOSIS — H10413 Chronic giant papillary conjunctivitis, bilateral: Secondary | ICD-10-CM | POA: Diagnosis not present

## 2018-11-21 DIAGNOSIS — Z794 Long term (current) use of insulin: Secondary | ICD-10-CM | POA: Diagnosis not present

## 2018-11-21 DIAGNOSIS — H25813 Combined forms of age-related cataract, bilateral: Secondary | ICD-10-CM | POA: Diagnosis not present

## 2018-11-21 DIAGNOSIS — H35033 Hypertensive retinopathy, bilateral: Secondary | ICD-10-CM | POA: Diagnosis not present

## 2018-11-21 DIAGNOSIS — E119 Type 2 diabetes mellitus without complications: Secondary | ICD-10-CM | POA: Diagnosis not present

## 2018-12-09 ENCOUNTER — Other Ambulatory Visit: Payer: Self-pay | Admitting: Internal Medicine

## 2018-12-09 ENCOUNTER — Other Ambulatory Visit: Payer: Self-pay

## 2018-12-09 MED ORDER — AMLODIPINE BESYLATE 5 MG PO TABS: 5.0000 mg | ORAL_TABLET | Freq: Every day | ORAL | 3 refills | Status: DC

## 2018-12-09 MED FILL — AMLODIPINE BESYLATE 5 MG TA: 5 | 90 days supply | Qty: 90 | Fill #0

## 2018-12-09 MED FILL — NOVOLOG MIX 70-30 FLEXPEN S: (70-30) 100 | 85 days supply | Qty: 12 | Fill #1

## 2018-12-09 MED FILL — DULoxetine HCL 20 MG CPEP: 20 | 30 days supply | Qty: 30 | Fill #2

## 2018-12-20 ENCOUNTER — Encounter: Payer: Self-pay | Admitting: Internal Medicine

## 2018-12-20 ENCOUNTER — Other Ambulatory Visit: Payer: Self-pay

## 2018-12-20 ENCOUNTER — Ambulatory Visit: Payer: 59 | Attending: Internal Medicine | Admitting: Internal Medicine

## 2018-12-20 DIAGNOSIS — E1122 Type 2 diabetes mellitus with diabetic chronic kidney disease: Secondary | ICD-10-CM | POA: Diagnosis not present

## 2018-12-20 DIAGNOSIS — N183 Chronic kidney disease, stage 3 (moderate): Secondary | ICD-10-CM

## 2018-12-20 DIAGNOSIS — E785 Hyperlipidemia, unspecified: Secondary | ICD-10-CM | POA: Diagnosis not present

## 2018-12-20 DIAGNOSIS — G8929 Other chronic pain: Secondary | ICD-10-CM

## 2018-12-20 DIAGNOSIS — I10 Essential (primary) hypertension: Secondary | ICD-10-CM | POA: Diagnosis not present

## 2018-12-20 DIAGNOSIS — M545 Low back pain: Secondary | ICD-10-CM

## 2018-12-20 DIAGNOSIS — G5691 Unspecified mononeuropathy of right upper limb: Secondary | ICD-10-CM

## 2018-12-20 DIAGNOSIS — Z8546 Personal history of malignant neoplasm of prostate: Secondary | ICD-10-CM | POA: Diagnosis not present

## 2018-12-20 DIAGNOSIS — Z794 Long term (current) use of insulin: Secondary | ICD-10-CM

## 2018-12-20 NOTE — Progress Notes (Signed)
Virtual Visit via Telephone Note Due to current restrictions/limitations of in-office visits due to the COVID-19 pandemic, this scheduled clinical appointment was converted to a telehealth visit  I connected with Robert Sparks on 12/20/18 at 9:51 a.m EDT by telephone and verified that I am speaking with the correct person using two identifiers. I am in my office.  The patient is at home.  Only the patient and myself participated in this encounter.  I discussed the limitations, risks, security and privacy concerns of performing an evaluation and management service by telephone and the availability of in person appointments. I also discussed with the patient that there may be a patient responsible charge related to this service. The patient expressed understanding and agreed to proceed.  History of Present Illness: Patient with history of HTN, DM 2,HL,CKD stage3, tob dependence, EtOH abuse, prostate CA,OA knee,vit B 12defand multiple myelomain remission.  Patient last seen 09/2018  C/o tiredness in his back when he walks.  Tyl#3 helps a little No pain in the legs.  Cramps in legs "not as bad as they was."  Screen ABI done in office on last visit was nl Pain in the knees when he first stands up but gets better once he starts walking  Hx prostate CA:  Reported seeing his urologist last mth.  Had blood test done.  Told no signs of recurrence.    Numbness in RT hand:  Plan was to get nerve conduction study by neurology in 10/2018 but was cancelled due to [place] pandemic.  Told him he will be called when they started seeing pts again.  DM:  Checks BS 3-4 x a day.  BS in the low 100s.  Has occasion BS in the 70s which he can tell when BS drops this low.  Occurs about 3-4 x a mth -currently on Novolog 70/30 7 units BID Eating habits:  Eats 3 meals a day Exercise:  "I walk a lot back and forth to the store every day."  Also has a stationary bike that he uses sometimes  HTN:  Checks BP daily.   Gives range 120-130s/70-80s Occasional dizziness if he stands too quickly No CP/SOB/LE edema Reports compliance with Norvasc and salt restriction  HL:  Reports compliance with Pravachol   Outpatient Encounter Medications as of 12/20/2018  Medication Sig Note  . ACCU-CHEK AVIVA PLUS test strip 1 each by Other route See admin instructions.   [name] acetaminophen-codeine (TYLENOL #3) 300-30 MG tablet TAKE 1 TABLET BY MOUTH EVERY 8 HOURS AS NEEDED FOR MODERATE PAIN.   [name] acyclovir (ZOVIRAX) 400 MG tablet Take 1 tablet (400 mg total) by mouth 2 (two) times daily.   [name] amLODipine (NORVASC) 5 MG tablet Take 1 tablet (5 mg total) by mouth daily.   [name] aspirin EC 81 MG tablet Take 1 tablet (81 mg total) by mouth daily.   [name] b complex vitamins capsule Take 1 capsule by mouth daily.   . Cholecalciferol (VITAMIN D) 2000 units CAPS Take 2,000 Units by mouth daily.   . clindamycin (CLEOCIN) 300 MG capsule Take 1 capsule (300 mg total) by mouth every 6 (six) hours. (Patient not taking: Reported on 12/20/2018)   . diclofenac sodium (VOLTAREN) 1 % GEL Apply 2 g topically 4 (four) times daily.   . DULoxetine (CYMBALTA) 20 MG capsule TAKE 1 CAPSULE BY MOUTH DAILY.   . ferrous sulfate 325 (65 FE) MG tablet Take 325 mg by mouth daily.   [name] gabapentin (NEURONTIN) 300 MG capsule Take  1 cap in a.m and a lunch and 2 at bedtime   . insulin aspart protamine - aspart (NOVOLOG 70/30 MIX) (70-30) 100 UNIT/ML FlexPen Inject 0.07 mLs (7 Units total) into the skin 2 (two) times daily with a meal.   . Insulin Pen Needle (TRUEPLUS PEN NEEDLES) 32G X 4 MM MISC Use as directed twice daily with insulin   . ixazomib citrate (NINLARO) 3 MG capsule Take 1 capsule by mouth on Day 1, Day 8 & D15 of each 28d cycle. Take on empty stomach 1hr before or 2hrs after food. Do not crush,chew,open (Patient not taking: Reported on 11/11/2018) 11/11/2018: Has been on hold since 09/11/18, may restart after office visit on 01/13/19  . Multiple Vitamin (MULTIVITAMIN  WITH MINERALS) TABS tablet Take 1 tablet by mouth daily.   . nicotine polacrilex (RA NICOTINE GUM) 2 MG gum Take 1 each (2 mg total) by mouth as needed for smoking cessation (limit to no more than 30 pieces/24 hr).   . NON FORMULARY [facility]   CREAMS- #11   . pravastatin (PRAVACHOL) 20 MG tablet Take 1 tablet (20 mg total) by mouth daily.   . tadalafil (ADCIRCA/CIALIS) 20 MG tablet Take 20 mg by mouth daily as needed for erectile dysfunction.    . triamcinolone cream (KENALOG) 0.1 % Apply 1 application topically 3 (three) times daily as needed.   . vitamin B-12 (CYANOCOBALAMIN) 100 MCG tablet Take 100 mcg by mouth daily.    No facility-administered encounter medications on file as of 12/20/2018.     Observations/Objective:    Chemistry      Component Value Date/Time   NA 138 11/11/2018 1023   NA 138 12/06/2017 0920   NA 137 07/13/2017 0938   K 3.8 11/11/2018 1023   K 3.3 (L) 07/13/2017 0938   CL 107 11/11/2018 1023   CO2 21 (L) 11/11/2018 1023   CO2 25 07/13/2017 0938   BUN 23 11/11/2018 1023   BUN 42 (H) 12/06/2017 0920   BUN 23.8 07/13/2017 0938   CREATININE 1.48 (H) 11/11/2018 1023   CREATININE 1.7 (H) 07/13/2017 0938      Component Value Date/Time   CALCIUM 9.2 11/11/2018 1023   CALCIUM 9.3 07/13/2017 0938   ALKPHOS 70 11/11/2018 1023   ALKPHOS 119 07/13/2017 0938   AST 23 11/11/2018 1023   AST 14 07/13/2017 0938   ALT 30 11/11/2018 1023   ALT 13 07/13/2017 0938   BILITOT 0.4 11/11/2018 1023   BILITOT 0.45 07/13/2017 0938     Lab Results  Component Value Date   WBC 6.0 11/11/2018   HGB 10.2 (L) 11/11/2018   HCT 31.6 (L) 11/11/2018   MCV 101.9 (H) 11/11/2018   PLT 156 11/11/2018    Assessment and Plan: 1. Controlled type 2 diabetes mellitus with stage 3 chronic kidney disease, with long-term current use of insulin (HCC) -Given hypoglycemic episodes, I recommend decreasing NovoLog 7030 to 60 units twice a day.  Continue to monitor blood sugars.   Encouraged him to stay active.  2. Essential hypertension Reported home blood pressure readings are good. Continue amlodipine. Advised to go slow with position changes.  3. Hyperlipidemia, unspecified hyperlipidemia type Continue pravastatin  4. Neuropathy of right hand Advised patient to wait a month or so and then we can try rescheduling him with the neurologist for the nerve conduction study to be done  5. Chronic midline low back pain without sciatica Chronic and remains bothersome to him.  I wonder whether he  is having some pseudoclaudication from spinal stenosis.  We will get advanced imaging study to evaluate further - MR Lumbar Spine Wo Contrast; Future  6.  History of prostate CA. Followed by urology  Follow Up Instructions: 3 mths   I discussed the assessment and treatment plan with the patient. The patient was provided an opportunity to ask questions and all were answered. The patient agreed with the plan and demonstrated an understanding of the instructions.   The patient was advised to call back or seek an in-person evaluation if the symptoms worsen or if the condition fails to improve as anticipated.  I provided 17 minutes of non-face-to-face time during this encounter.   Karle Plumber, MD

## 2018-12-23 ENCOUNTER — Telehealth: Payer: Self-pay

## 2018-12-23 MED FILL — ACCU-CHEK AVIVA PLUS STRP: 23 days supply | Qty: 100 | Fill #3

## 2018-12-23 NOTE — Telephone Encounter (Signed)
Contacted pt to go over appointment information for his MRI  MRI is scheduled for January 06, 2019 at 4pm at [facility]. Pt is to arrive at 345pm  Pt doesn't have any questions or concerns

## 2018-12-24 ENCOUNTER — Telehealth: Payer: Self-pay | Admitting: Internal Medicine

## 2018-12-24 NOTE — Telephone Encounter (Signed)
LVM to call back to schedule 3 month f/u

## 2018-12-26 ENCOUNTER — Encounter (INDEPENDENT_AMBULATORY_CARE_PROVIDER_SITE_OTHER): Payer: Medicare HMO | Admitting: Diagnostic Neuroimaging

## 2018-12-26 ENCOUNTER — Ambulatory Visit (INDEPENDENT_AMBULATORY_CARE_PROVIDER_SITE_OTHER): Payer: Medicare HMO | Admitting: Diagnostic Neuroimaging

## 2018-12-26 ENCOUNTER — Other Ambulatory Visit: Payer: Self-pay

## 2018-12-26 DIAGNOSIS — Z0289 Encounter for other administrative examinations: Secondary | ICD-10-CM

## 2018-12-26 DIAGNOSIS — R202 Paresthesia of skin: Secondary | ICD-10-CM | POA: Diagnosis not present

## 2018-12-26 DIAGNOSIS — R200 Anesthesia of skin: Secondary | ICD-10-CM

## 2019-01-01 DIAGNOSIS — Z20828 Contact with and (suspected) exposure to other viral communicable diseases: Secondary | ICD-10-CM | POA: Diagnosis not present

## 2019-01-02 NOTE — Procedures (Signed)
[facility]  NCS (NERVE CONDUCTION STUDY) WITH EMG (ELECTROMYOGRAPHY) REPORT   STUDY DATE: 12/26/18 PATIENT NAME: Siegfried Vieth Zuver DOB: August 10, 1945 MRN: [id number]  ORDERING CLINICIAN: Andrey Spearman, MD   TECHNOLOGIST: Sherre Scarlet ELECTROMYOGRAPHER: Earlean Polka. Zema Lizardo, MD  CLINICAL INFORMATION: 73 year old male with right hand numbness and weakness.  FINDINGS: NERVE CONDUCTION STUDY: Right median motor response has prolonged distal latency, normal amplitude and slow conduction velocity.  Right ulnar motor responses normal distal latency, normal amplitude, slow conduction velocity with stimulation above the elbow (36 m/s).  Right radial and right ulnar sensory responses have prolonged peak latencies and decreased amplitudes.  Right median sensory response cannot be obtained.  Right median to ulnar transcarpal mixed nerve comparison is prolonged.   NEEDLE ELECTROMYOGRAPHY:  Needle examination of right upper extremity is normal.   IMPRESSION:   Abnormal study demonstrating: - Right median neuropathy at the wrist consistent with right carpal tunnel syndrome. - Right ulnar neuropathy at elbow.    INTERPRETING PHYSICIAN:  Penni Bombard, MD Certified in Neurology, Neurophysiology and Neuroimaging  [facility] [address] [place], [place] [address] [phone]  [facility]    Nerve / Sites Muscle Latency Ref. Amplitude Ref. Rel Amp Segments Distance Velocity Ref. Area    ms ms mV mV %  cm m/s m/s mVms  R Median - APB     Wrist APB 5.7 ?4.4 4.2 ?4.0 100 Wrist - APB 7   18.8     Upper arm APB 10.9  3.9  91.1 Upper arm - Wrist 23 44 ?49 17.4  R Ulnar - ADM     Wrist ADM 2.8 ?3.3 9.2 ?6.0 100 Wrist - ADM 7   25.1     B.Elbow ADM 6.9  7.8  85 B.Elbow - Wrist 20 49 ?49 24.7     A.Elbow ADM 9.6  6.3  81.6 A.Elbow - B.Elbow 10 36 ?49 23.1         A.Elbow - Wrist             SNC    Nerve / Sites Rec. Site Peak Lat Ref.   Amp Ref. Segments Distance Peak Diff Ref.    ms ms V V  cm ms ms  R Radial - Anatomical snuff box (Forearm)     Forearm Wrist 3.1 ?2.9 10 ?15 Forearm - Wrist 10    R Median, Ulnar - Transcarpal comparison     Median Palm Wrist 5.6 ?2.2 6 ?35 Median Palm - Wrist 8       Ulnar Palm Wrist 2.3 ?2.2 6 ?12 Ulnar Palm - Wrist 8          Median Palm - Ulnar Palm  3.3 ?0.4  R Median - Orthodromic (Dig II, Mid palm)     Dig II Wrist NR ?3.4 NR ?10 Dig II - Wrist 13    R Ulnar - Orthodromic, (Dig V, Mid palm)     Dig V Wrist 3.2 ?3.1 3 ?5 Dig V - Wrist 75               F  Wave    Nerve F Lat Ref.   ms ms  R Ulnar - ADM 33.2 ?32.0       EMG full       EMG Summary Table    Spontaneous MUAP Recruitment  Muscle IA Fib PSW Fasc Other Amp Dur. Poly Pattern  R. Deltoid Normal None None None _______  Normal Normal Normal Normal  R. Biceps brachii Normal None None None _______ Normal Normal Normal Normal  R. Triceps brachii Normal None None None _______ Normal Normal Normal Normal  R. Flexor carpi radialis Normal None None None _______ Normal Normal Normal Normal  R. Flexor carpi ulnaris Normal None None None _______ Normal Normal Normal Normal  R. First dorsal interosseous Normal None None None _______ Normal Normal Normal Normal

## 2019-01-06 ENCOUNTER — Other Ambulatory Visit: Payer: Self-pay

## 2019-01-06 ENCOUNTER — Ambulatory Visit (HOSPITAL_COMMUNITY)
Admission: RE | Admit: 2019-01-06 | Discharge: 2019-01-06 | Disposition: A | Discharge status: Home / Self Care | Payer: Medicare HMO | Source: Ambulatory Visit | Attending: Internal Medicine | Admitting: Internal Medicine

## 2019-01-06 ENCOUNTER — Other Ambulatory Visit: Payer: Self-pay | Admitting: Internal Medicine

## 2019-01-06 DIAGNOSIS — M51369 Other intervertebral disc degeneration, lumbar region without mention of lumbar back pain or lower extremity pain: Secondary | ICD-10-CM

## 2019-01-06 DIAGNOSIS — M5136 Other intervertebral disc degeneration, lumbar region: Secondary | ICD-10-CM

## 2019-01-06 DIAGNOSIS — M545 Low back pain: Secondary | ICD-10-CM | POA: Insufficient documentation

## 2019-01-06 DIAGNOSIS — G8929 Other chronic pain: Secondary | ICD-10-CM | POA: Diagnosis not present

## 2019-01-06 DIAGNOSIS — M1711 Unilateral primary osteoarthritis, right knee: Secondary | ICD-10-CM

## 2019-01-06 DIAGNOSIS — E1142 Type 2 diabetes mellitus with diabetic polyneuropathy: Secondary | ICD-10-CM

## 2019-01-06 DIAGNOSIS — M5126 Other intervertebral disc displacement, lumbar region: Secondary | ICD-10-CM | POA: Diagnosis not present

## 2019-01-06 DIAGNOSIS — M170 Bilateral primary osteoarthritis of knee: Secondary | ICD-10-CM

## 2019-01-06 DIAGNOSIS — M48061 Spinal stenosis, lumbar region without neurogenic claudication: Secondary | ICD-10-CM | POA: Diagnosis not present

## 2019-01-06 MED FILL — GABAPENTIN 300 MG CAPSULE: 300 | 90 days supply | Qty: 360 | Fill #1

## 2019-01-07 MED FILL — DULoxetine HCL 20 MG CPEP: 20 | 30 days supply | Qty: 30 | Fill #0

## 2019-01-07 NOTE — Telephone Encounter (Signed)
Placed at the front desk for pickup.

## 2019-01-08 ENCOUNTER — Telehealth: Payer: Self-pay | Admitting: Internal Medicine

## 2019-01-08 DIAGNOSIS — G8929 Other chronic pain: Secondary | ICD-10-CM

## 2019-01-08 NOTE — Telephone Encounter (Signed)
PC placed to pt today to go over results of MRI of lumbar spine. I left VMM letting him know the reason for my call and that I will have my CMA try to reach him again to give results.  I also called home phone # listed.  Male answered and said he was not there.  I told her I will try him again later.

## 2019-01-09 ENCOUNTER — Other Ambulatory Visit: Payer: Self-pay | Admitting: *Deleted

## 2019-01-09 DIAGNOSIS — C9001 Multiple myeloma in remission: Secondary | ICD-10-CM

## 2019-01-09 NOTE — Progress Notes (Signed)
HEMATOLOGY/ONCOLOGY CLINIC NOTE  Date of Service: 01/13/2019  Patient Care Team: [name], MD as PCP - General (Internal Medicine) [name], MD (Inactive) as Consulting Physician (Hematology and Oncology)  CHIEF COMPLAINTS/PURPOSE OF CONSULTATION:  F/u for continued mx of Multiple Myeloma  Oncologic History:  73 y.o. male with diagnosis of IgA lambda active multiple myeloma, ISS Stage I. Active disease diagnosed based on presence of anemia, kidney insufficiency, and paraproteinemia with significant predominance of lambda light chains as well as significant elevation of IgA. Bone marrow biopsy confirmed presence of atypical monoclonal plasma cell process in the bone marrow comprising at least 7% of the cellularity. Based on the findings, patient was started on treatment with lenalidomide, bortezomib, and low-dose dexamethasone based on the anticipated tolerance by the patient. Lenalidomide was started at 10 mg daily based on creatinine clearance of 42, dexamethasone dose was reduced to 20 mg weekly based on patient's age. Treatment was complicated by rapid development of cutaneous rash attributed to lenalidomide, inaddition to decreased renal function and now patient has persistent severe anemia. Side-effects were attributed to Lenalidomide and lenalidomide was discontinued with subsequent recovery.  Patient has been receiving bortezomib weekly with low-dose dexamethasone in 4-day cycles.  Patient has completed induction systemic therapy for his disease with repeat bone marrow biopsy confirming complete response including no evidence of minimal residual disease by cytogenetics or FISH.  HISTORY OF PRESENTING ILLNESS:   Robert Sparks is a wonderful 73 y.o. male who has been referred to [place] by my colleague Dr Grace Isaac for evaluation and management of Multiple Myeloma. The pt reports that he is doing well overall.   The pt has been previously followed by my colleague  Dr Grace Isaac. The pt was treated with Revlimid, Velcade, and dexamethasone and was subsequently switched to Velcade and Dexamethasone after a Revlimid intolerance.   The pt reports some knee aches and skin soreness in his legs which is worse at night. He notes that he has had diabetic-related peripheral neuropathy. The pt has not seen [facility] yet for a discussion of a BM transplant. He continues taking Acyclovir and notes no difficulty taking maintenance Velcade.   He adds that he takes Gabapentin and sweats.  He notes that his neuropathy is currently stable and denies it worsening.   He notes that a pack of cigarettes lasts him for about 3 days.   Most recent lab results (12/14/17) of CBC and CMP  is as follows: all values are WNL except for RBC at 3.08, HGB at 10.1, HCT at 31.0, MCV at 100.6, Eosinophils Abs at 1.4k, Potassium at 3.4, Glucose at 156, Creatinine at 1.60. LDH 12/14/17 is 141 Beta 2 microglobulin 12/14/17 is 2.4  On review of systems, pt reports knee pain, night time LE skin soreness, peripheral neuropathy, eating well, strong appetite, intermittent sweating and denies fevers, chills, back pains, abdominal pains, and any other symptoms.   [name] DM type 2, HTN, CKD. On Social Hx the pt reports smoking a pack of cigarettes every 3 days.   [name] returns today regarding management and evaluation of his Multiple Myeloma. [name] was on 11/11/18. The pt reports that he is doing well overall.  The pt reports that he has not developed any new concerns. He notes that his right hand neuropathy feels a little better. He has been using a wrist splint at night and saw Neurology  in the interim. The pt notes that his toes feel cold sometimes. He denies any muscle cramps. He notes that he has been eating well and denies any abdominal pains.  Of note since the patient's last visit, pt has had an NCV and EMG completed on  12/26/18 with results revealing "Right median neuropathy at the wrist consistent with right carpal tunnel syndrome. Right ulnar neuropathy at elbow."  Lab results today (01/13/19) of CBC w/diff and CMP is as follows: all values are WNL except for RBC at 3.22, HGB at 11.0, HCT at 32.6, MCV at 101.2, MCH at 34.2, Eosinophils abs at 1.9k, Glucose at 104, Creatinine at 1.45, GFR at 56.  On review of systems, pt reports good energy levels, eating well, and denies abdominal pains, leg swelling, falls, abdominal pains, and any other symptoms.   MEDICAL HISTORY:  Past Medical History:  Diagnosis Date   Anemia    Arthritis    shoulders,feet    Cataract    per eye dr -has appt 5-11   CKD (chronic kidney disease), stage III (HCC)    Elevated PSA    History of ketoacidosis    03-29-2015    History of sepsis    07-25-2013  non-compliant w/ medication   Hyperlipidemia    Hypertension    Nocturia    Peripheral neuropathy    Prostate cancer ([place])    Type 2 diabetes mellitus with insulin therapy [facility])     SURGICAL HISTORY: Past Surgical History:  Procedure Laterality Date   CIRCUMCISION  1990's   PROSTATE BIOPSY N/A 12/21/2015   Procedure: BIOPSY TRANSRECTAL ULTRASONIC PROSTATE (TUBP);  Surgeon: Festus Aloe, MD;  Location: [facility];  Service: Urology;  Laterality: N/A;    SOCIAL HISTORY: Social History   Socioeconomic History   Marital status: Single    Spouse name: Not on file   Number of children: Not on file   Years of education: Not on file   Highest education level: Not on file  Occupational History   Not on file  Social Needs   Financial resource strain: Not on file   Food insecurity    Worry: Not on file    Inability: Not on file   Transportation needs    Medical: Not on file    Non-medical: Not on file  Tobacco Use   Smoking status: Current Every Day Smoker    Packs/day: 0.50    Years: 50.00    Pack years: 25.00    Types:  Cigarettes   Smokeless tobacco: Never Used   Tobacco comment: 1 ppwk-4-5 a day   Substance and Sexual Activity   Alcohol use: Yes    Alcohol/week: 1.0 standard drinks    Types: 1 Cans of beer per week    Comment: 1 every day-quit couple weeks ago   Drug use: No   Sexual activity: Not Currently  Lifestyle   Physical activity    Days per week: Not on file    Minutes per session: Not on file   Stress: Not on file  Relationships   Social connections    Talks on phone: Not on file    Gets together: Not on file    Attends religious service: Not on file    Active member of club or organization: Not on file    Attends meetings of clubs or organizations: Not on file    Relationship status: Not on file   Intimate partner violence    Fear of current  or ex partner: Not on file    Emotionally abused: Not on file    Physically abused: Not on file    Forced sexual activity: Not on file  Other Topics Concern   Not on file  Social History Narrative   Not on file    FAMILY HISTORY: Family History  Problem Relation Age of Onset   Kidney disease Mother    Cancer Neg Hx    Colon cancer Neg Hx    Colon polyps Neg Hx    Esophageal cancer Neg Hx    Rectal cancer Neg Hx    Stomach cancer Neg Hx     ALLERGIES:  has No Known Allergies.  MEDICATIONS:  Current Outpatient Medications  Medication Sig Dispense Refill   ACCU-CHEK AVIVA PLUS test strip 1 each by Other route See admin instructions. 100 each 12   acetaminophen-codeine (TYLENOL #3) 300-30 MG tablet TAKE ONE TABLET BY MOUTH EVERY 8 HOURS AS NEEDED FOR MODERATE PAIN 60 tablet 0   acyclovir (ZOVIRAX) 400 MG tablet Take 1 tablet (400 mg total) by mouth 2 (two) times daily. 60 tablet 5   amLODipine (NORVASC) 5 MG tablet Take 1 tablet (5 mg total) by mouth daily. 90 tablet 3   aspirin EC 81 MG tablet Take 1 tablet (81 mg total) by mouth daily. 90 tablet 3   b complex vitamins capsule Take 1 capsule by mouth  daily. 30 capsule 5   Cholecalciferol (VITAMIN D) 2000 units CAPS Take 2,000 Units by mouth daily.     diclofenac sodium (VOLTAREN) 1 % GEL Apply 2 g topically 4 (four) times daily. 100 g 2   DULoxetine (CYMBALTA) 20 MG capsule TAKE 1 CAPSULE BY MOUTH DAILY. 30 capsule 2   ferrous sulfate 325 (65 FE) MG tablet Take 325 mg by mouth daily.     gabapentin (NEURONTIN) 300 MG capsule Take 1 cap in a.m and a lunch and 2 at bedtime 120 capsule 6   insulin aspart protamine - aspart (NOVOLOG 70/30 MIX) (70-30) 100 UNIT/ML FlexPen Inject 0.07 mLs (7 Units total) into the skin 2 (two) times daily with a meal. 15 mL 6   Insulin Pen Needle (TRUEPLUS PEN NEEDLES) 32G X 4 MM MISC Use as directed twice daily with insulin 100 each 3   Multiple Vitamin (MULTIVITAMIN WITH MINERALS) TABS tablet Take 1 tablet by mouth daily. 60 tablet 2   nicotine polacrilex (RA NICOTINE GUM) 2 MG gum Take 1 each (2 mg total) by mouth as needed for smoking cessation (limit to no more than 30 pieces/24 hr). 100 tablet 3   NON FORMULARY Opt[employer] APOTHECARY   CREAMS- #11     pravastatin (PRAVACHOL) 20 MG tablet Take 1 tablet (20 mg total) by mouth daily. 90 tablet 3   tadalafil (ADCIRCA/CIALIS) 20 MG tablet Take 20 mg by mouth daily as needed for erectile dysfunction.   0   triamcinolone cream (KENALOG) 0.1 % Apply 1 application topically 3 (three) times daily as needed. 60 g 2   vitamin B-12 (CYANOCOBALAMIN) 100 MCG tablet Take 100 mcg by mouth daily.     No current facility-administered medications for this visit.     REVIEW OF SYSTEMS:    A 10+ POINT REVIEW OF SYSTEMS WAS OBTAINED including neurology, dermatology, psychiatry, cardiac, respiratory, lymph, extremities, GI, GU, Musculoskeletal, constitutional, breasts, reproductive, HEENT.  All pertinent positives are noted in the HPI.  All others are negative.   PHYSICAL EXAMINATION: ECOG PERFORMANCE STATUS: 1 - Symptomatic but  completely  ambulatory  GENERAL:alert, in no acute distress and comfortable SKIN: no acute rashes, no significant lesions EYES: conjunctiva are pink and non-injected, sclera anicteric OROPHARYNX: MMM, no exudates, no oropharyngeal erythema or ulceration NECK: supple, no JVD LYMPH:  no palpable lymphadenopathy in the cervical, axillary or inguinal regions LUNGS: clear to auscultation b/l with normal respiratory effort HEART: regular rate & rhythm ABDOMEN:  normoactive bowel sounds , non tender, not distended. No palpable hepatosplenomegaly.  Extremity: no pedal edema PSYCH: alert & oriented x 3 with fluent speech NEURO: no focal motor/sensory deficits   LABORATORY DATA:  I have reviewed the data as listed  . CBC Latest Ref Rng & Units 01/13/2019 11/11/2018 09/11/2018  WBC 4.0 - 10.5 K/uL 6.9 6.0 4.2  Hemoglobin 13.0 - 17.0 g/dL 11.0(L) 10.2(L) 8.7(L)  Hematocrit 39.0 - 52.0 % 32.6(L) 31.6(L) 27.3(L)  Platelets 150 - 400 K/uL 208 156 118(L)    . CMP Latest Ref Rng & Units 01/13/2019 11/11/2018 09/11/2018  Glucose 70 - 99 mg/dL 104(H) 160(H) 124(H)  BUN 8 - 23 mg/dL '23 23 15  ' Creatinine 0.61 - 1.24 mg/dL 1.45(H) 1.48(H) 1.58(H)  Sodium 135 - 145 mmol/L 141 138 137  Potassium 3.5 - 5.1 mmol/L 3.9 3.8 3.8  Chloride 98 - 111 mmol/L 108 107 104  CO2 22 - 32 mmol/L 24 21(L) 25  Calcium 8.9 - 10.3 mg/dL 9.6 9.2 9.0  Total Protein 6.5 - 8.1 g/dL 7.2 6.9 6.8  Total Bilirubin 0.3 - 1.2 mg/dL 0.6 0.4 0.4  Alkaline Phos 38 - 126 U/L 57 70 51  AST 15 - 41 U/L 23 23 34  ALT 0 - 44 U/L '29 30 22   ' 08/29/17 BM Bx:    08/29/17 Cytogenetics:   03/13/17 Cytogenetics:          RADIOGRAPHIC STUDIES: I have personally reviewed the radiological images as listed and agreed with the findings in the report. Mr Lumbar Spine Wo Contrast  Result Date: 01/06/2019 CLINICAL DATA:  Initial evaluation for chronic midline lower back pain without sciatica. EXAM: MRI LUMBAR SPINE WITHOUT CONTRAST TECHNIQUE: Multiplanar,  multisequence MR imaging of the lumbar spine was performed. No intravenous contrast was administered. COMPARISON:  Prior radiograph from 09/10/2017. FINDINGS: Segmentation: Standard. Lowest well-formed disc labeled the L5-S1 level. Alignment: Mild straightening of the normal lumbar lordosis with underlying trace dextroscoliosis. No listhesis. Vertebrae: Heterogeneous signal intensity seen throughout the visualized bone marrow, consistent with history of multiple myeloma. Vertebral body heights maintained without evidence for acute or chronic fracture. Reactive marrow edema present about the left greater than right L4-5 facets due to facet arthritis. No other abnormal marrow edema. Conus medullaris and cauda equina: Conus extends to the L2 level. Conus and cauda equina appear normal. Paraspinal and other soft tissues: Paraspinous soft tissues within normal limits. Subcentimeter T2 hyperintense simple cyst partially visualize within the right kidney. Visualized visceral structures otherwise unremarkable. Disc levels: L1-2: Mild diffuse disc bulge with disc desiccation. No canal or foraminal stenosis. L2-3: Mild diffuse disc bulge with disc desiccation. Mild facet hypertrophy. Superimposed 7 mm synovial cyst present at the anterior aspect of the right L2-3 facet (series 8, image 12). Resultant mild to moderate right lateral recess stenosis. Central canal remains patent. No significant foraminal narrowing. L3-4: Chronic intervertebral disc space narrowing with diffuse disc bulge and disc desiccation. Mild reactive endplate changes marginal endplate osteophytic spurring. Mild facet and ligament flavum hypertrophy. Resultant mild left lateral recess narrowing. Central canal remains patent. No significant foraminal encroachment.  L4-5: Mild diffuse disc bulge with disc desiccation. Moderate facet and ligament flavum hypertrophy with small joint effusions, slightly worse on the left. Associated mild reactive marrow edema.  Mild lateral effacement of the lateral recesses without narrowing of the central canal. Mild to moderate bilateral L4 foraminal stenosis. L5-S1: Minimal disc bulge with disc desiccation. Mild facet hypertrophy. No significant canal or foraminal stenosis. No impingement. IMPRESSION: 1. Mild disc bulge with superimposed right-sided 7 mm synovial cyst at L2-3 with resultant mild to moderate right lateral recess stenosis. 2. Disc bulging with moderate facet hypertrophy at L4-5 with resultant mild to moderate bilateral L4 foraminal stenosis. 3. Additional mild noncompressive disc bulging elsewhere within the lumbar spine without significant stenosis or neural impingement. 4. Moderate left worse than right facet hypertrophy at L4-5 with associated reactive marrow edema. Finding could contribute to lower back pain. Electronically Signed   By: Jeannine Boga M.D.   On: 01/06/2019 16:45    ASSESSMENT & PLAN:   73 y.o. male with   1. IgA Lambda Multiple Myeloma ISS Stage I -Currently in remission  Active disease was previously diagnosed based on presence of anemia, kidney insufficiency, and paraproteinemia with significant predominance of lambda light chains as well as significant elevation of IgA.  PLAN:  -Discussed pt labwork today, 01/13/19; blood counts and chemistries are stable. -01/13/19 MMP and SFLC are pending. Last available 11/11/18 SFLC revealed Kappa light chains stable at 62.1 with K:L ratio of 2.47. Last available 11/11/18 MMP did not observe an M spike. -Reviewed the 12/26/18 NCV with EMG which revealed "Right median neuropathy at the wrist consistent with right carpal tunnel syndrome. Right ulnar neuropathy at elbow."  -Discussed that choice of continuing to hold Ninlaro vs resuming neuropathy as his right hand neuropathy has alternative explanation now. Noted that Ninlaro could worsen his existing neuropathy but can potentially keep his myeloma away for longer. Discussed the risks vs benefits of  this decision. -Pt prefers to continue holding Ninlaro and watching labs at this time -Could be element of Raynaud's as well and recommended keeping hands warm actively -Recommend pt continue using wrist splint, avoid sleeping on right hand -Continue Gabapentin and Cymbalta per PCP -Recommend PCP rule out Peripheral arterial disease with [place] with ABIs given leg cramping -Recommend using a cane when walking to prevent falls. Pt notes he has a cane and uses this sometimes. -Continue Acyclovir  -Recommended that the pt begin a Vitamin B complex -Will see the pt back in 3 months   RTC with Dr Irene Limbo with labs in 3 months   All of the patients questions were answered with apparent satisfaction. The patient knows to call the clinic with any problems, questions or concerns.  The total time spent in the appt was 20 minutes and more than 50% was on counseling and direct patient cares.    Sullivan Lone MD MS AAHIVMS [facility] Hematology/Oncology Physician [facility]  (Office):       [phone] (Work cell):  [phone] (Fax):           [phone]  01/13/2019 12:54 PM  I, Baldwin [place], am acting as a scribe for Dr. Sullivan Lone.   .I have reviewed the above documentation for accuracy and completeness, and I agree with the above. Brunetta Genera MD

## 2019-01-13 ENCOUNTER — Telehealth: Payer: Self-pay | Admitting: Hematology

## 2019-01-13 ENCOUNTER — Inpatient Hospital Stay (HOSPITAL_BASED_OUTPATIENT_CLINIC_OR_DEPARTMENT_OTHER): Payer: Medicare HMO | Admitting: Hematology

## 2019-01-13 ENCOUNTER — Inpatient Hospital Stay: Payer: Medicare HMO | Attending: Hematology

## 2019-01-13 ENCOUNTER — Other Ambulatory Visit: Payer: Self-pay

## 2019-01-13 VITALS — BP 140/84 | HR 75 | Temp 98.5°F | Resp 18 | Ht 66.0 in | Wt 153.9 lb

## 2019-01-13 DIAGNOSIS — C9001 Multiple myeloma in remission: Secondary | ICD-10-CM

## 2019-01-13 LAB — CBC WITH DIFFERENTIAL (CANCER CENTER ONLY)
Abs Immature Granulocytes: 0.01 10*3/uL (ref 0.00–0.07)
Basophils Absolute: 0.1 10*3/uL (ref 0.0–0.1)
Basophils Relative: 1 %
Eosinophils Absolute: 1.9 10*3/uL — ABNORMAL HIGH (ref 0.0–0.5)
Eosinophils Relative: 28 %
HCT: 32.6 % — ABNORMAL LOW (ref 39.0–52.0)
Hemoglobin: 11 g/dL — ABNORMAL LOW (ref 13.0–17.0)
Immature Granulocytes: 0 %
Lymphocytes Relative: 20 %
Lymphs Abs: 1.4 10*3/uL (ref 0.7–4.0)
MCH: 34.2 pg — ABNORMAL HIGH (ref 26.0–34.0)
MCHC: 33.7 g/dL (ref 30.0–36.0)
MCV: 101.2 fL — ABNORMAL HIGH (ref 80.0–100.0)
Monocytes Absolute: 0.5 10*3/uL (ref 0.1–1.0)
Monocytes Relative: 7 %
Neutro Abs: 3.1 10*3/uL (ref 1.7–7.7)
Neutrophils Relative %: 44 %
Platelet Count: 208 10*3/uL (ref 150–400)
RBC: 3.22 MIL/uL — ABNORMAL LOW (ref 4.22–5.81)
RDW: 13.4 % (ref 11.5–15.5)
WBC Count: 6.9 10*3/uL (ref 4.0–10.5)
nRBC: 0 % (ref 0.0–0.2)

## 2019-01-13 LAB — CMP (CANCER CENTER ONLY)
ALT: 29 U/L (ref 0–44)
AST: 23 U/L (ref 15–41)
Albumin: 4 g/dL (ref 3.5–5.0)
Alkaline Phosphatase: 57 U/L (ref 38–126)
Anion gap: 9 (ref 5–15)
BUN: 23 mg/dL (ref 8–23)
CO2: 24 mmol/L (ref 22–32)
Calcium: 9.6 mg/dL (ref 8.9–10.3)
Chloride: 108 mmol/L (ref 98–111)
Creatinine: 1.45 mg/dL — ABNORMAL HIGH (ref 0.61–1.24)
GFR, Est AFR Am: 56 mL/min — ABNORMAL LOW (ref >60)
GFR, Estimated: 48 mL/min — ABNORMAL LOW (ref >60)
Glucose, Bld: 104 mg/dL — ABNORMAL HIGH (ref 70–99)
Potassium: 3.9 mmol/L (ref 3.5–5.1)
Sodium: 141 mmol/L (ref 135–145)
Total Bilirubin: 0.6 mg/dL (ref 0.3–1.2)
Total Protein: 7.2 g/dL (ref 6.5–8.1)

## 2019-01-13 NOTE — Telephone Encounter (Signed)
Scheduled appt per 7/6 los. Printed calendar and avs.

## 2019-01-14 LAB — KAPPA/LAMBDA LIGHT CHAINS
Kappa free light chain: 58.5 mg/L — ABNORMAL HIGH (ref 3.3–19.4)
Kappa, lambda light chain ratio: 2.17 — ABNORMAL HIGH (ref 0.26–1.65)
Lambda free light chains: 27 mg/L — ABNORMAL HIGH (ref 5.7–26.3)

## 2019-01-14 NOTE — Telephone Encounter (Signed)
Patient verified DOB Patient is aware of bulging disc being noted on his MRI and PT and rehab contacting him with the next month and a half to schedule a consultation and make a plan.

## 2019-01-15 LAB — MULTIPLE MYELOMA PANEL, SERUM
Albumin SerPl Elph-Mcnc: 4.7 g/dL — ABNORMAL HIGH (ref 2.9–4.4)
Albumin/Glob SerPl: 1.7 (ref 0.7–1.7)
Alpha 1: 0.2 g/dL (ref 0.0–0.4)
Alpha2 Glob SerPl Elph-Mcnc: 0.9 g/dL (ref 0.4–1.0)
B-Globulin SerPl Elph-Mcnc: 0.8 g/dL (ref 0.7–1.3)
Gamma Glob SerPl Elph-Mcnc: 1 g/dL (ref 0.4–1.8)
Globulin, Total: 2.8 g/dL (ref 2.2–3.9)
IgA: 154 mg/dL (ref 61–437)
IgG (Immunoglobin G), Serum: 1102 mg/dL (ref 603–1613)
IgM (Immunoglobulin M), Srm: 103 mg/dL (ref 20–172)
Total Protein ELP: 7.5 g/dL (ref 6.0–8.5)

## 2019-01-16 ENCOUNTER — Other Ambulatory Visit: Payer: Self-pay | Admitting: Internal Medicine

## 2019-01-16 MED FILL — ACYCLOVIR 400 MG TABLET: 400 | 30 days supply | Qty: 60 | Fill #1

## 2019-01-16 MED FILL — PRAVASTATIN SODIUM 20 MG TA: 20 | 90 days supply | Qty: 90 | Fill #0

## 2019-01-20 MED FILL — ACCU-CHEK AVIVA PLUS STRP: 23 days supply | Qty: 100 | Fill #4

## 2019-02-04 ENCOUNTER — Encounter: Payer: Self-pay | Admitting: Podiatry

## 2019-02-04 ENCOUNTER — Other Ambulatory Visit: Payer: Self-pay

## 2019-02-04 ENCOUNTER — Ambulatory Visit (INDEPENDENT_AMBULATORY_CARE_PROVIDER_SITE_OTHER): Payer: Medicare HMO | Admitting: Podiatry

## 2019-02-04 DIAGNOSIS — E1142 Type 2 diabetes mellitus with diabetic polyneuropathy: Secondary | ICD-10-CM | POA: Diagnosis not present

## 2019-02-04 DIAGNOSIS — B351 Tinea unguium: Secondary | ICD-10-CM | POA: Diagnosis not present

## 2019-02-04 DIAGNOSIS — L84 Corns and callosities: Secondary | ICD-10-CM

## 2019-02-04 DIAGNOSIS — M79675 Pain in left toe(s): Secondary | ICD-10-CM

## 2019-02-04 DIAGNOSIS — M79674 Pain in right toe(s): Secondary | ICD-10-CM | POA: Diagnosis not present

## 2019-02-04 NOTE — Patient Instructions (Signed)
Diabetes Mellitus and Foot Care Foot care is an important part of your health, especially when you have diabetes. Diabetes may cause you to have problems because of poor blood flow (circulation) to your feet and legs, which can cause your skin to:  Become thinner and drier.  Break more easily.  Heal more slowly.  Peel and crack. You may also have nerve damage (neuropathy) in your legs and feet, causing decreased feeling in them. This means that you may not notice minor injuries to your feet that could lead to more serious problems. Noticing and addressing any potential problems early is the best way to prevent future foot problems. How to care for your feet Foot hygiene  Wash your feet daily with warm water and mild soap. Do not use hot water. Then, pat your feet and the areas between your toes until they are completely dry. Do not soak your feet as this can dry your skin.  Trim your toenails straight across. Do not dig under them or around the cuticle. File the edges of your nails with an emery board or nail file.  Apply a moisturizing lotion or petroleum jelly to the skin on your feet and to dry, brittle toenails. Use lotion that does not contain alcohol and is unscented. Do not apply lotion between your toes. Shoes and socks  Wear clean socks or stockings every day. Make sure they are not too tight. Do not wear knee-high stockings since they may decrease blood flow to your legs.  Wear shoes that fit properly and have enough cushioning. Always look in your shoes before you put them on to be sure there are no objects inside.  To break in new shoes, wear them for just a few hours a day. This prevents injuries on your feet. Wounds, scrapes, corns, and calluses  Check your feet daily for blisters, cuts, bruises, sores, and redness. If you cannot see the bottom of your feet, use a mirror or ask someone for help.  Do not cut corns or calluses or try to remove them with medicine.  If you  find a minor scrape, cut, or break in the skin on your feet, keep it and the skin around it clean and dry. You may clean these areas with mild soap and water. Do not clean the area with peroxide, alcohol, or iodine.  If you have a wound, scrape, corn, or callus on your foot, look at it several times a day to make sure it is healing and not infected. Check for: ? Redness, swelling, or pain. ? Fluid or blood. ? Warmth. ? Pus or a bad smell. General instructions  Do not cross your legs. This may decrease blood flow to your feet.  Do not use heating pads or hot water bottles on your feet. They may burn your skin. If you have lost feeling in your feet or legs, you may not know this is happening until it is too late.  Protect your feet from hot and cold by wearing shoes, such as at the beach or on hot pavement.  Schedule a complete foot exam at least once a year (annually) or more often if you have foot problems. If you have foot problems, report any cuts, sores, or bruises to your health care provider immediately. Contact a health care provider if:  You have a medical condition that increases your risk of infection and you have any cuts, sores, or bruises on your feet.  You have an injury that is not   healing.  You have redness on your legs or feet.  You feel burning or tingling in your legs or feet.  You have pain or cramps in your legs and feet.  Your legs or feet are numb.  Your feet always feel cold.  You have pain around a toenail. Get help right away if:  You have a wound, scrape, corn, or callus on your foot and: ? You have pain, swelling, or redness that gets worse. ? You have fluid or blood coming from the wound, scrape, corn, or callus. ? Your wound, scrape, corn, or callus feels warm to the touch. ? You have pus or a bad smell coming from the wound, scrape, corn, or callus. ? You have a fever. ? You have a red line going up your leg. Summary  Check your feet every day  for cuts, sores, red spots, swelling, and blisters.  Moisturize feet and legs daily.  Wear shoes that fit properly and have enough cushioning.  If you have foot problems, report any cuts, sores, or bruises to your health care provider immediately.  Schedule a complete foot exam at least once a year (annually) or more often if you have foot problems. This information is not intended to replace advice given to you by your health care provider. Make sure you discuss any questions you have with your health care provider. Document Released: 06/23/2000 Document Revised: 08/08/2017 Document Reviewed: 07/28/2016 Elsevier Patient Education  2020 [employer]   Onychomycosis/Fungal Toenails  WHAT IS IT? An infection that lies within the keratin of your nail plate that is caused by a fungus.  WHY ME? Fungal infections affect all ages, sexes, races, and creeds.  There may be many factors that predispose you to a fungal infection such as age, coexisting medical conditions such as diabetes, or an autoimmune disease; stress, medications, fatigue, genetics, etc.  Bottom line: fungus thrives in a warm, moist environment and your shoes offer such a location.  IS IT CONTAGIOUS? Theoretically, yes.  You do not want to share shoes, nail clippers or files with someone who has fungal toenails.  Walking around barefoot in the same room or sleeping in the same bed is unlikely to transfer the organism.  It is important to realize, however, that fungus can spread easily from one nail to the next on the same foot.  HOW DO WE TREAT THIS?  There are several ways to treat this condition.  Treatment may depend on many factors such as age, medications, pregnancy, liver and kidney conditions, etc.  It is best to ask your doctor which options are available to you.  1. No treatment.   Unlike many other medical concerns, you can live with this condition.  However for many people this can be a painful condition and may lead to  ingrown toenails or a bacterial infection.  It is recommended that you keep the nails cut short to help reduce the amount of fungal nail. 2. Topical treatment.  These range from herbal remedies to prescription strength nail lacquers.  About 40-50% effective, topicals require twice daily application for approximately 9 to 12 months or until an entirely new nail has grown out.  The most effective topicals are medical grade medications available through physicians offices. 3. Oral antifungal medications.  With an 80-90% cure rate, the most common oral medication requires 3 to 4 months of therapy and stays in your system for a year as the new nail grows out.  Oral antifungal medications do require   blood work to make sure it is a safe drug for you.  A liver function panel will be performed prior to starting the medication and after the first month of treatment.  It is important to have the blood work performed to avoid any harmful side effects.  In general, this medication safe but blood work is required. 4. Laser Therapy.  This treatment is performed by applying a specialized laser to the affected nail plate.  This therapy is noninvasive, fast, and non-painful.  It is not covered by insurance and is therefore, out of pocket.  The results have been very good with a 80-95% cure rate.  The [facility] is the only practice in the area to offer this therapy. 5. Permanent Nail Avulsion.  Removing the entire nail so that a new nail will not grow back. 

## 2019-02-06 NOTE — Progress Notes (Signed)
Subjective: Robert Sparks presents today with history of neuropathy. Patient seen for follow up of chronic, painful mycotic toenails and painful callus right hallux which interfere with daily activities and routine tasks.  Pain is aggravated when wearing enclosed shoe gear and relieved with periodic professional debridement.   [name], MD is his PCP.    Current Outpatient Medications:  .  ACCU-CHEK AVIVA PLUS test strip, 1 each by Other route See admin instructions., Disp: 100 each, Rfl: 12 .  acetaminophen-codeine (TYLENOL #3) 300-30 MG tablet, TAKE ONE TABLET BY MOUTH EVERY 8 HOURS AS NEEDED FOR MODERATE PAIN, Disp: 60 tablet, Rfl: 0 .  acyclovir (ZOVIRAX) 400 MG tablet, Take 1 tablet (400 mg total) by mouth 2 (two) times daily., Disp: 60 tablet, Rfl: 5 .  amLODipine (NORVASC) 5 MG tablet, Take 1 tablet (5 mg total) by mouth daily., Disp: 90 tablet, Rfl: 3 .  aspirin EC 81 MG tablet, Take 1 tablet (81 mg total) by mouth daily., Disp: 90 tablet, Rfl: 3 .  b complex vitamins capsule, Take 1 capsule by mouth daily., Disp: 30 capsule, Rfl: 5 .  Cholecalciferol (VITAMIN D) 2000 units CAPS, Take 2,000 Units by mouth daily., Disp: , Rfl:  .  diclofenac sodium (VOLTAREN) 1 % GEL, Apply 2 g topically 4 (four) times daily., Disp: 100 g, Rfl: 2 .  DULoxetine (CYMBALTA) 20 MG capsule, TAKE 1 CAPSULE BY MOUTH DAILY., Disp: 30 capsule, Rfl: 2 .  ferrous sulfate 325 (65 FE) MG tablet, Take 325 mg by mouth daily., Disp: , Rfl:  .  gabapentin (NEURONTIN) 300 MG capsule, Take 1 cap in a.m and a lunch and 2 at bedtime, Disp: 120 capsule, Rfl: 6 .  insulin aspart protamine - aspart (NOVOLOG 70/30 MIX) (70-30) 100 UNIT/ML FlexPen, Inject 0.07 mLs (7 Units total) into the skin 2 (two) times daily with a meal., Disp: 15 mL, Rfl: 6 .  Insulin Pen Needle (TRUEPLUS PEN NEEDLES) 32G X 4 MM MISC, Use as directed twice daily with insulin, Disp: 100 each, Rfl: 3 .  Multiple Vitamin (MULTIVITAMIN WITH MINERALS)  TABS tablet, Take 1 tablet by mouth daily., Disp: 60 tablet, Rfl: 2 .  nicotine polacrilex (RA NICOTINE GUM) 2 MG gum, Take 1 each (2 mg total) by mouth as needed for smoking cessation (limit to no more than 30 pieces/24 hr)., Disp: 100 tablet, Rfl: 3 .  NON FORMULARY, [facility]   CREAMS- #11, Disp: , Rfl:  .  pravastatin (PRAVACHOL) 20 MG tablet, TAKE 1 TABLET BY MOUTH DAILY., Disp: 90 tablet, Rfl: 0 .  tadalafil (ADCIRCA/CIALIS) 20 MG tablet, Take 20 mg by mouth daily as needed for erectile dysfunction. , Disp: , Rfl: 0 .  triamcinolone cream (KENALOG) 0.1 %, Apply 1 application topically 3 (three) times daily as needed., Disp: 60 g, Rfl: 2 .  vitamin B-12 (CYANOCOBALAMIN) 100 MCG tablet, Take 100 mcg by mouth daily., Disp: , Rfl:   No Known Allergies  Objective: There were no vitals filed for this visit.  Vascular Examination: Capillary refill time immediate x 10 digits.  Dorsalis pedis pulses present b/l.  Posterior tibial pulses present b/l.  No digital hair x 10 digits.  Skin temperature WNL b/l.  Dermatological Examination: Skin with normal turgor, texture and tone b/l.  Toenails 1-5 b/l discolored, thick, dystrophic with subungual debris and pain with palpation to nailbeds due to thickness of nails.  Hyperkeratotic lesions subhallux IPJ right foot and 1st metatarsal head left foot. No erythema,  no edema, no drainage, no flocculence noted.   Musculoskeletal: Muscle strength 5/5 to all LE muscle groups.  Neurological: Sensation absent b/l with 10 gram monofilament.  Assessment: 1. Painful onychomycosis toenails 1-5 b/l 2. Callus right hallux and 1st met head left foot 3. NIDDM with neuropathy   Plan: 1. Toenails 1-5 b/l were debrided in length and girth without iatrogenic bleeding. 2. Calluses pared right hallux and 1st met head left foot utilizing sterile scalpel blade without incident. 3. Patient to continue soft, supportive shoe gear daily. 4. Patient  to report any pedal injuries to medical professional immediately. 5. Follow up 3 months.  6. Patient/POA to call should there be a concern in the interim.

## 2019-02-07 MED FILL — DULoxetine HCL 20 MG CPEP: 20 | 30 days supply | Qty: 30 | Fill #1

## 2019-02-17 MED FILL — ACCU-CHEK AVIVA PLUS STRP: 23 days supply | Qty: 100 | Fill #5

## 2019-02-18 ENCOUNTER — Other Ambulatory Visit: Payer: Self-pay

## 2019-02-24 IMAGING — PT NM PET TUM IMG INITIAL (PI) WHOLE BODY
7 series · 25 of 25 positions shown · non-contrast
Comparison: Multiple exams, including CT scan from 12/29/2015

CLINICAL DATA: Initial treatment strategy for multiple myeloma.
History prostate cancer..

EXAM:
NUCLEAR MEDICINE PET WHOLE BODY
TECHNIQUE: 7.7 mCi F-18 FDG was injected intravenously. Full-ring PET imaging
was performed from the vertex to the feet after the radiotracer. CT
data was obtained and used for attenuation correction and anatomic
localization.
FASTING BLOOD GLUCOSE:  Value: 117 mg/dl

[Series 3: pet wb ac · axial · 5.0mm · 4.07mm/px · z∈[-515,+1285]mm · 5 of 451 slices shown]
[im 1/451]
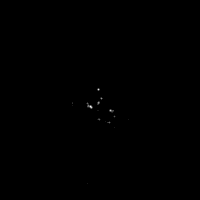
[im 113/451]
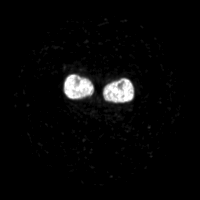
[im 226/451]
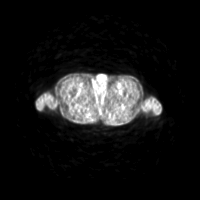
[im 338/451]
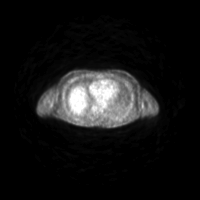
[im 451/451]
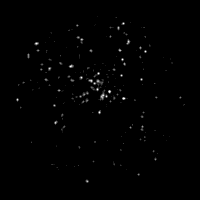

[Series 4: ct wb 5.0 hd_fov · axial · 5.0mm · 1.11mm/px · z∈[-515,+1285]mm · 5 of 451 slices shown]
[im 1/451]
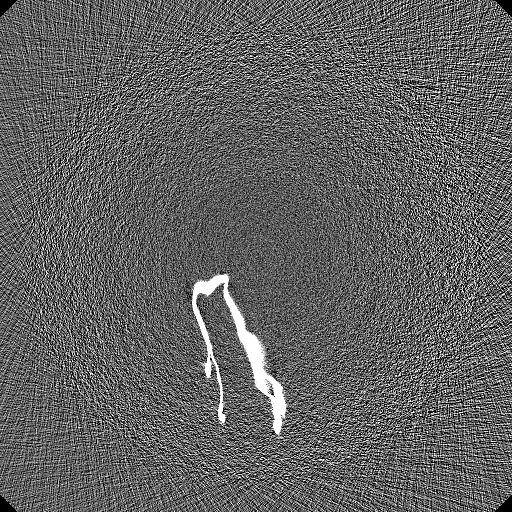
[im 113/451  soft-tissue]
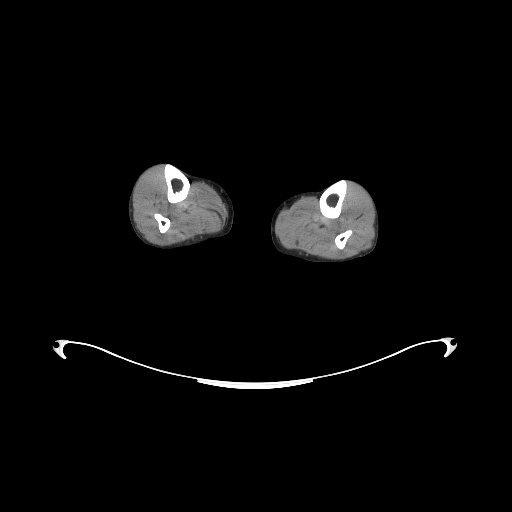
[im 226/451  soft-tissue]
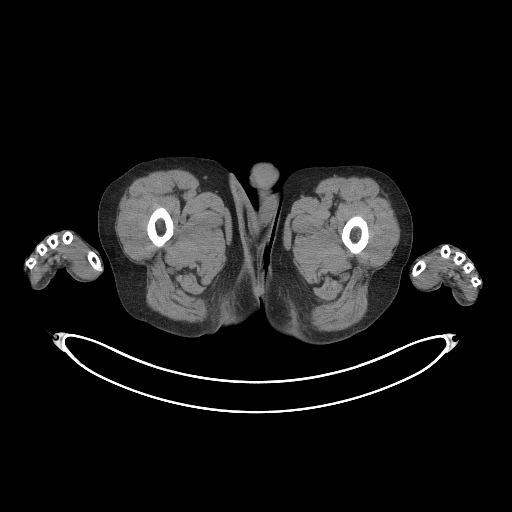
[im 338/451  soft-tissue]
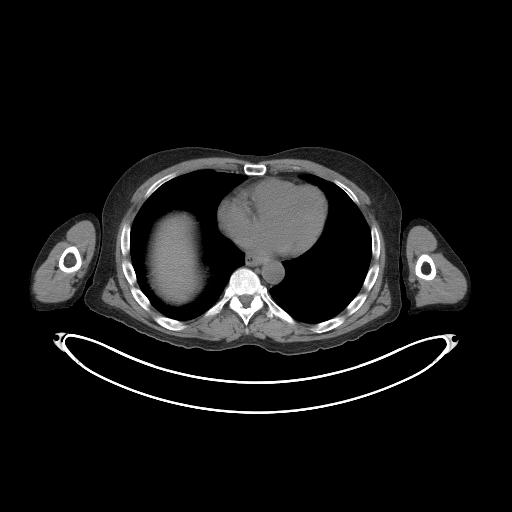
[im 451/451  soft-tissue]
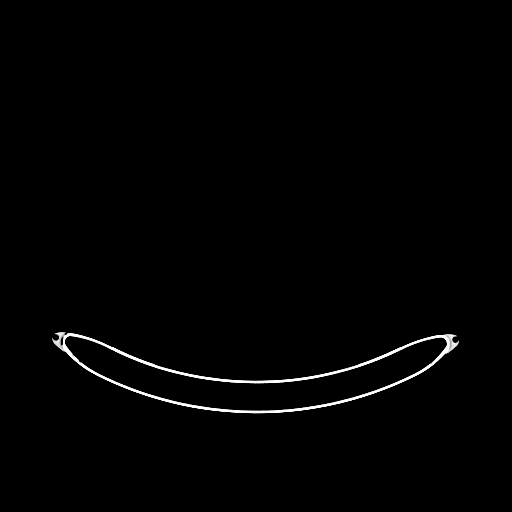

[Series 7: ct wb 5.0 b70f (id)_bone · axial · 5.0mm · 0.63mm/px · 1 of 62 slices shown]
[im 1/62  soft-tissue]
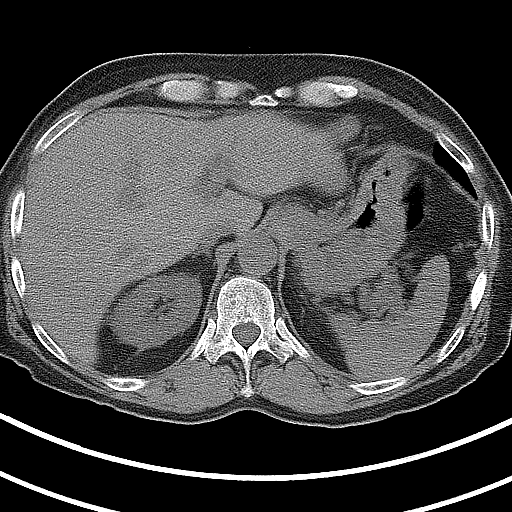

[Series 8: pet wb nac · axial · 5.0mm · 4.07mm/px · z∈[-511,+1285]mm · 6 of 450 slices shown]
[im 1/450]
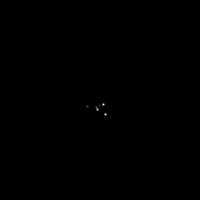
[im 90/450]
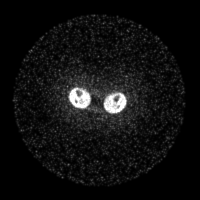
[im 180/450]
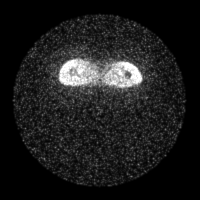
[im 270/450]
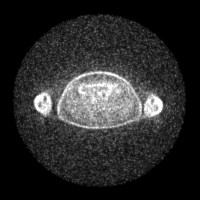
[im 360/450]
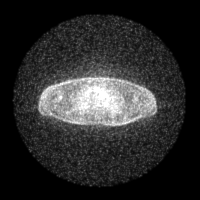
[im 450/450]
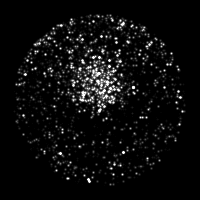

[Series 604: range-ac ct wb 5.0 hd_fov-cor-<alpha range> · 1 of 59 slices shown]
[im 1/59]
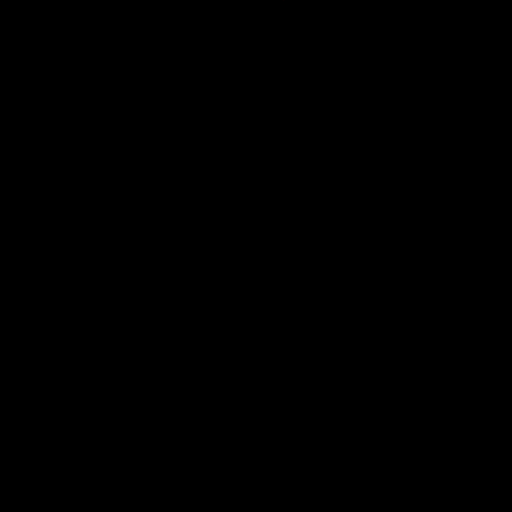

[Series 605: mip collection · coronal · 3.73mm/px · 1 of 32 slices shown]
[im 1/32]
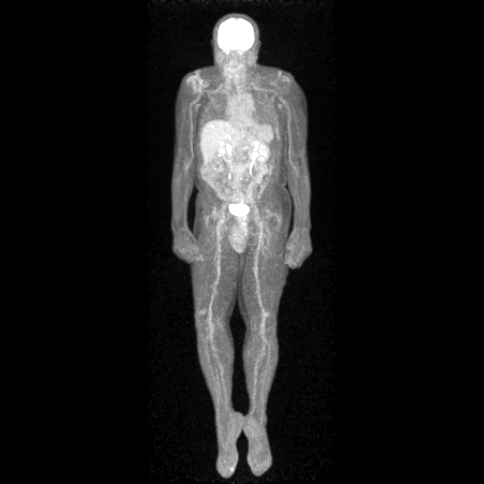

[Series 606: range-ac ct wb 5.0 hd_fov-tra-<alpha range> · 6 of 425 slices shown]
[im 1/425]
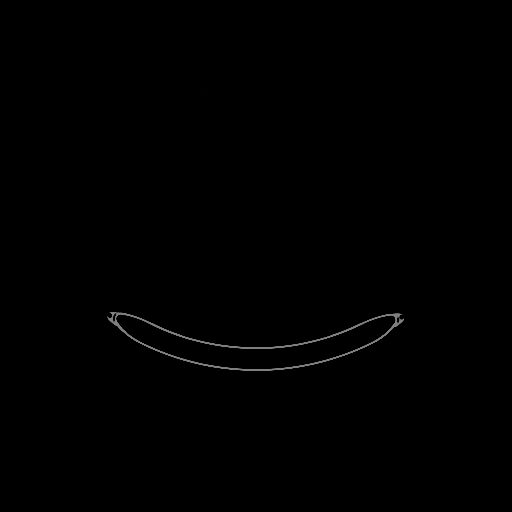
[im 85/425]
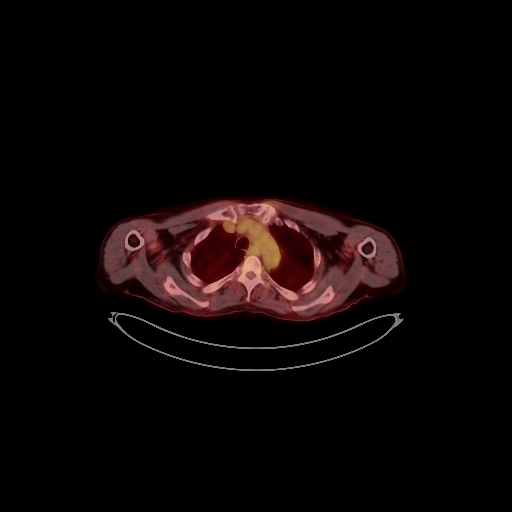
[im 170/425]
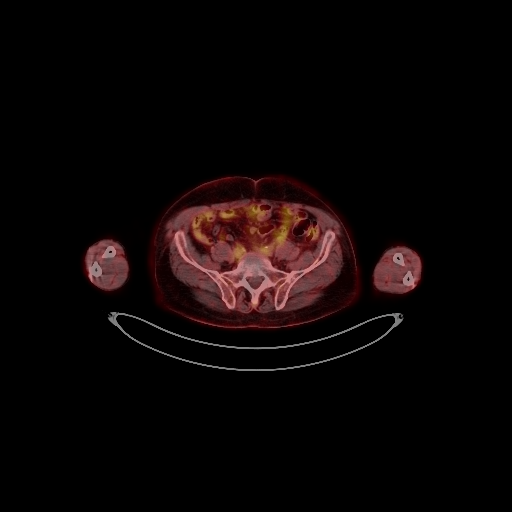
[im 255/425]
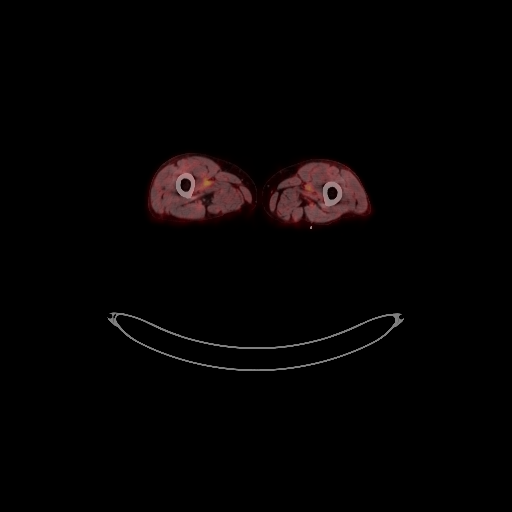
[im 340/425]
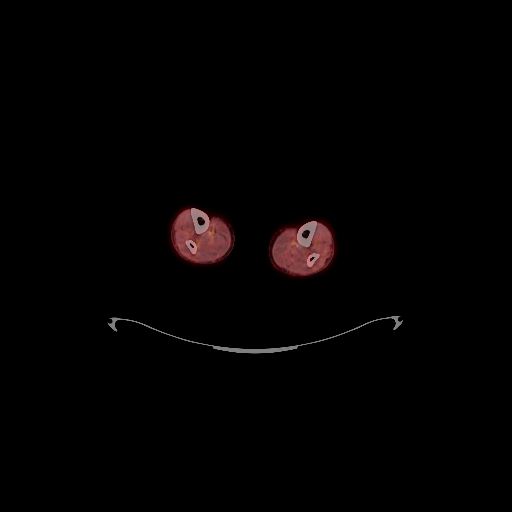
[im 425/425]
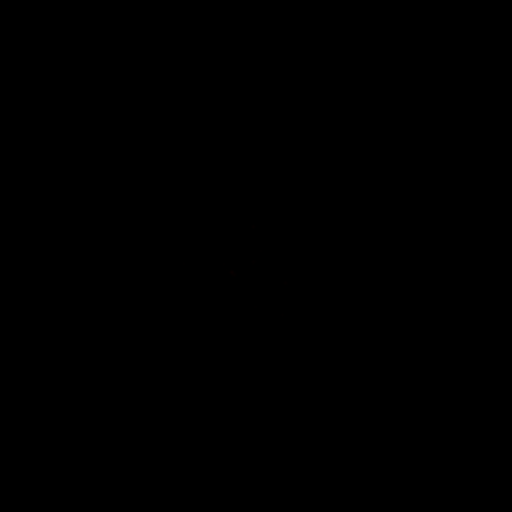

[25 of 25 positions shown; findings below may reference images not displayed]

FINDINGS: HEAD/NECK

No hypermetabolic activity in the scalp. No hypermetabolic cervical
lymph nodes.

CHEST

No hypermetabolic mediastinal or hilar nodes.

A 5 by 3 mm pleural-based right lower lobe pulmonary nodule on image
44/7 is not appreciably changed compared to 12/29/15 and has no
associated abnormal metabolic activity.

ABDOMEN/PELVIS

No abnormal hypermetabolic activity within the liver, pancreas,
adrenal glands, or spleen. No hypermetabolic lymph nodes in the
abdomen or pelvis. Calcifications compatible with old granulomatous
disease in the spleen. Mild gallbladder wall thickening,
nonspecific. Mild chronic appearing bilateral perirenal stranding.

Scattered colonic diverticulosis. Aortoiliac atherosclerotic
vascular disease. Mild urinary bladder wall thickening. Fiducials
adjacent to the prostate gland. Subtle perirectal stranding and
stranding adjacent to the prostate gland, potentially treatment
related from prior prostate cancer.

SKELETON

Healing fractures the left posterior eighth and ninth ribs the, with
very low associated metabolic activity an without an obvious
underlying myelomatous lesion. No hypermetabolic osseous findings.

EXTREMITIES

Focal activity along a cutaneous ulceration in the right great toe
plantar surface, believed to be inflammatory.
IMPRESSION: 1. No abnormal hypermetabolic activity or other specific findings of
active myeloma.
2. Several adjacent healing left lateral rib fractures.
3. 4 mm in average size pleural-based right lower lobe pulmonary
nodule is unchanged compared to 12/29/2015, and mild technically
nonspecific, is likely to be benign.
4. Therapy related findings in the lower pelvis. Fiducials adjacent
to the prostate gland.
5. Nonspecific gallbladder wall thickening.
6. Mild bladder wall thickening, low-grade cystitis not excluded.
Some of this may be treatment related from prior prostate cancer.
7.  Aortic Atherosclerosis ([id number].F).

## 2019-02-28 MED FILL — NOVOLOG MIX 70-30 FLEXPEN S: (70-30) 100 | 85 days supply | Qty: 12 | Fill #2

## 2019-03-04 MED FILL — AMLODIPINE BESYLATE 5 MG TA: 5 | 90 days supply | Qty: 90 | Fill #1

## 2019-03-10 ENCOUNTER — Encounter: Payer: Medicare HMO | Admitting: Physical Medicine & Rehabilitation

## 2019-03-14 ENCOUNTER — Encounter: Payer: Self-pay | Admitting: Physical Medicine and Rehabilitation

## 2019-03-14 ENCOUNTER — Other Ambulatory Visit: Payer: Self-pay

## 2019-03-14 ENCOUNTER — Encounter: Payer: Medicare HMO | Attending: Physical Medicine & Rehabilitation | Admitting: Physical Medicine and Rehabilitation

## 2019-03-14 VITALS — BP 129/76 | HR 68 | Temp 98.7°F | Ht 65.0 in | Wt 159.0 lb

## 2019-03-14 DIAGNOSIS — M48061 Spinal stenosis, lumbar region without neurogenic claudication: Secondary | ICD-10-CM

## 2019-03-14 DIAGNOSIS — M545 Low back pain, unspecified: Secondary | ICD-10-CM

## 2019-03-14 DIAGNOSIS — M7918 Myalgia, other site: Secondary | ICD-10-CM

## 2019-03-14 DIAGNOSIS — F0280 Dementia in other diseases classified elsewhere without behavioral disturbance: Secondary | ICD-10-CM | POA: Diagnosis not present

## 2019-03-14 DIAGNOSIS — G8929 Other chronic pain: Secondary | ICD-10-CM | POA: Diagnosis not present

## 2019-03-14 DIAGNOSIS — Z5181 Encounter for therapeutic drug level monitoring: Secondary | ICD-10-CM

## 2019-03-14 DIAGNOSIS — M48062 Spinal stenosis, lumbar region with neurogenic claudication: Secondary | ICD-10-CM | POA: Insufficient documentation

## 2019-03-14 MED ORDER — LIDOCAINE 5 % EX PTCH: 2.0000 | MEDICATED_PATCH | Freq: Two times a day (BID) | CUTANEOUS | 5 refills | Status: DC

## 2019-03-14 MED ORDER — DONEPEZIL HCL 5 MG PO TABS: 5.0000 mg | ORAL_TABLET | Freq: Every day | ORAL | 2 refills | Status: DC

## 2019-03-14 MED ORDER — DULOXETINE HCL 30 MG PO CPEP: 30.0000 mg | ORAL_CAPSULE | Freq: Every day | ORAL | 5 refills | Status: DC

## 2019-03-14 MED ORDER — CYCLOBENZAPRINE HCL 10 MG PO TABS: 10.0000 mg | ORAL_TABLET | Freq: Every day | ORAL | 5 refills | Status: DC

## 2019-03-14 MED FILL — LIDOCAINE 5 % PTCH: 5 | 30 days supply | Qty: 60 | Fill #0

## 2019-03-14 MED FILL — DONEPEZIL HCL 5 MG TABLET: 5 | 30 days supply | Qty: 30 | Fill #0

## 2019-03-14 MED FILL — DULoxetine HCL 30 MG CPEP: 30 | 33 days supply | Qty: 60 | Fill #0

## 2019-03-14 NOTE — Patient Instructions (Signed)
Pt is a 73 yr old male with in remission Multiple myeloma x 4 months here with back fatigue more than pain and myofascial pain. He also has complaints of memory issues  1. Forgetfulness/mild dementia?- Aricept/memory medicine- 5 mg nightly- at bedtime since can cause nausea   2. Lumbar stenosis/myofascial pain/low back pain-  - lidocaine patches 2 patches 12 hrs on;12 hrs off- do during day - Cyclobenzaprine 10 mg at bedtime for muscle tightness - increase Duloxetine- will increase to 30 mg daily x   1 week then 60 mg daily- for nerve and back pain. Takes a few weeks to work. - tennis balls- 3 pack- hold pressure x 3-4 minutes on each uncomfortable spot with 1-2 tennis balls at a time. Move tennis balls and then hold pressure again and lean on them against the wall- don't move around!!! Do 15-20 minutes/day maximum!!!!  3. Dispo- f/u in  43month

## 2019-03-14 NOTE — Telephone Encounter (Signed)
Original order did not process through to pharmacy.

## 2019-03-14 NOTE — Progress Notes (Signed)
Subjective:    Patient ID: Robert Sparks, male    DOB: February 24, 1946, 73 y.o.   MRN: [id number]  HPI CC; low back pain  Pt is a 73 yr old R handed male with hx of multiple myeloma- in remission x 4 months, DM with neuropathy, and here for evaluation of low back pain.  No pain at rest Describes as a band across his low back- radiates pain into B/L groin/hips when walking/activities. Mostly "tired" in back- not a pain, just mostly tired.  Had a couple of years that can remember. Dx'd with multiple myeloma he thinks, before back pain started.  No pain with sitting. Just pain with moving around. Doesn't hurt when lays down.  Feet and lower legs are sore as well- however has DM neuropathy- also tingles.  Takes Tylenol #3 "for a long time"- right now, not taking it; stopped taking it 2-3 months ago- didn't prescribe them for him anymore. Didn't help much. Gabapentin 300 mg in am, lunch and 2 pills at night- helps him sleep well. Bene on Gabapentin for a LONG time- doesn't seem to help "back tired feeling".  On Duloxetine 20 mg daily- no side effects with it- been on that med/dose for "a long time".   Interferes with- walking, even making his bed, doing dishes, vacuuming/mow the lawn- can't even mow lawn anymore.  It's better with sitting/laying down (knees hurt when gets up after sitting a long time).  Had fluid on R knee last year- got a shot in it- fluid went away- couldn't tell if helped pain.     Pain Inventory Average Pain 5 Pain Right Now 0 My pain is aching  In the last 24 hours, has pain interfered with the following? General activity 6 Relation with others 6 Enjoyment of life 6 What TIME of day is your pain at its worst? all Sleep (in general) Poor  Pain is worse with: walking, bending, sitting, standing and unsure Pain improves with: . Relief from Meds: 0  Mobility walk without assistance walk with assistance use a cane ability to climb steps?  yes do you  drive?  no Doesn't use cane frequently- uses when tired/infrequently Drives "sometimes"  Function retired  Neuro/Psych weakness numbness tingling trouble walking dizziness  Prior Studies Any changes since last visit?  no  Physicians involved in your care Any changes since last visit?  no   Family History  Problem Relation Age of Onset  . Kidney disease Mother   . Cancer Neg Hx   . Colon cancer Neg Hx   . Colon polyps Neg Hx   . Esophageal cancer Neg Hx   . Rectal cancer Neg Hx   . Stomach cancer Neg Hx    Social History   Socioeconomic History  . Marital status: Single    Spouse name: Not on file  . Number of children: Not on file  . Years of education: Not on file  . Highest education level: Not on file  Occupational History  . Not on file  Social Needs  . Financial resource strain: Not on file  . Food insecurity    Worry: Not on file    Inability: Not on file  . Transportation needs    Medical: Not on file    Non-medical: Not on file  Tobacco Use  . Smoking status: Current Every Day Smoker    Packs/day: 0.50    Years: 50.00    Pack years: 25.00    Types: Cigarettes  .  Smokeless tobacco: Never Used  . Tobacco comment: 1 ppwk-4-5 a day   Substance and Sexual Activity  . Alcohol use: Yes    Alcohol/week: 1.0 standard drinks    Types: 1 Cans of beer per week    Comment: 1 every day-quit couple weeks ago  . Drug use: No  . Sexual activity: Not Currently  Lifestyle  . Physical activity    Days per week: Not on file    Minutes per session: Not on file  . Stress: Not on file  Relationships  . Social Herbalist on phone: Not on file    Gets together: Not on file    Attends religious service: Not on file    Active member of club or organization: Not on file    Attends meetings of clubs or organizations: Not on file    Relationship status: Not on file  Other Topics Concern  . Not on file  Social History Narrative  . Not on file   Past  Surgical History:  Procedure Laterality Date  . CIRCUMCISION  1990's  . PROSTATE BIOPSY N/A 12/21/2015   Procedure: BIOPSY TRANSRECTAL ULTRASONIC PROSTATE (TUBP);  Surgeon: Festus Aloe, MD;  Location: [facility];  Service: Urology;  Laterality: N/A;   Past Medical History:  Diagnosis Date  . Anemia   . Arthritis    shoulders,feet   . Cataract    per eye dr -has appt 5-11  . CKD (chronic kidney disease), stage III ([place])   . Elevated PSA   . History of ketoacidosis    03-29-2015   . History of sepsis    07-25-2013  non-compliant w/ medication  . Hyperlipidemia   . Hypertension   . Nocturia   . Peripheral neuropathy   . Prostate cancer ([place])   . Type 2 diabetes mellitus with insulin therapy ([facility])    BP 129/76   Pulse 68   Temp 98.7 F (37.1 C)   Ht '5\' 5"'  (1.651 m)   Wt 159 lb (72.1 kg)   SpO2 97%   BMI 26.46 kg/m   Opioid Risk Score:   Fall Risk Score:  `1  Depression screen PHQ 2/9  Depression screen [facility] 2/9 08/27/2018 06/20/2018 03/21/2018 01/07/2018 12/06/2017 09/04/2017 06/04/2017  Decreased Interest 0 0 1 0 1 0 0  Down, Depressed, Hopeless 0 0 0 0 0 0 0  PHQ - 2 Score 0 0 1 0 1 0 0  Altered sleeping 0 - - - - 0 2  Tired, decreased energy 0 - - - - 0 3  Change in appetite 0 - - - - 0 0  Feeling bad or failure about yourself  0 - - - - 0 0  Trouble concentrating - - - - - 0 0  Moving slowly or fidgety/restless 0 - - - - 0 0  Suicidal thoughts 0 - - - - 0 0  PHQ-9 Score 0 - - - - 0 5  Difficult doing work/chores Not difficult at all - - - - - -  Some recent data might be hidden   Pack will last him 2-3 days, smoking- real forgetful as well Has a roommate- sometimes loses keys- sometimes cant' remember water left on counter- asking for pill to help "touch of dementia".   Review of Systems  Constitutional: Positive for diaphoresis.  HENT: Negative.   Eyes: Negative.   Respiratory: Positive for apnea and cough.   Cardiovascular: Negative.    Gastrointestinal:  Positive for constipation.  Endocrine: Negative.   Genitourinary: Negative.   Musculoskeletal: Positive for gait problem.  Skin: Negative.   Allergic/Immunologic: Negative.   Neurological: Positive for dizziness, weakness and numbness.  Hematological: Negative.   Psychiatric/Behavioral: Negative.   All other systems reviewed and are negative. feels like real forgetful- has been that way for "a long time" Feet sore all the time- can't walk on floor without shoes    Objective:   Physical Exam  Awake, alert, appropriate, Ox3- knows president and next holiday, NAD MS: 5/5 in UEs and LEs B/L Tight musculature around paraspinals L1-S2 paraspinals- NOT TTP Hyperextension causes no increased pain; not TTP over entire back SLR (-) B/L; leaning forward- lumbar flexion causes no increased Sx's. Neuro: DTRs absent in LEs except L patella 1+ Sensation intact to light touch in all extremities- except tips of toes and tips of fingers B/L AO x3 and able to answer all questions, except does focus on "a long time" vs dates/lengths of time       Assessment & Plan:   Pt is a 73 yr old male with in remission Multiple myeloma x 4 months here with back fatigue more than pain and myofascial pain. He also has complaints of memory issues  1. Forgetfulness/mild dementia?- Aricept/memory medicine- 5 mg nightly- at bedtime since can cause nausea   2. Lumbar stenosis/myofascial pain/low back pain-  - lidocaine patches 2 patches 12 hrs on;12 hrs off- do during day - Cyclobenzaprine 10 mg at bedtime for muscle tightness - increase Duloxetine- will increase to 30 mg daily x   1 week then 60 mg daily- for nerve and back pain. Takes a few weeks to work. - tennis balls- 3 pack- hold pressure x 3-4 minutes on each uncomfortable spot with 1-2 tennis balls at a time. Move tennis balls and then hold pressure again and lean on them against the wall- don't move around!!! Do 15-20 minutes/day  maximum!!!!  3. Dispo- f/u in  2 months   Spent 30 minutes on appointment- more than 15 minutes on educating pt regarding plan as detailed above and gave copy of plan as well due to memory issues.

## 2019-03-24 MED FILL — ACYCLOVIR 400 MG TABLET: 400 | 30 days supply | Qty: 60 | Fill #2

## 2019-03-25 MED FILL — ACCU-CHEK AVIVA PLUS STRP: 23 days supply | Qty: 100 | Fill #6

## 2019-04-11 MED FILL — CYCLOBENZAPRINE 10 MG TAB: 10 | 30 days supply | Qty: 30 | Fill #0

## 2019-04-14 NOTE — Progress Notes (Signed)
HEMATOLOGY/ONCOLOGY CLINIC NOTE  Date of Service: 04/15/2019  Patient Care Team: [name], MD as PCP - General (Internal Medicine) [name], MD (Inactive) as Consulting Physician (Hematology and Oncology)  CHIEF COMPLAINTS/PURPOSE OF CONSULTATION:  F/u for continued mx of Multiple Myeloma  Oncologic History:  73 y.o. male with diagnosis of IgA lambda active multiple myeloma, ISS Stage I. Active disease diagnosed based on presence of anemia, kidney insufficiency, and paraproteinemia with significant predominance of lambda light chains as well as significant elevation of IgA. Bone marrow biopsy confirmed presence of atypical monoclonal plasma cell process in the bone marrow comprising at least 7% of the cellularity. Based on the findings, patient was started on treatment with lenalidomide, bortezomib, and low-dose dexamethasone based on the anticipated tolerance by the patient. Lenalidomide was started at 10 mg daily based on creatinine clearance of 42, dexamethasone dose was reduced to 20 mg weekly based on patient's age. Treatment was complicated by rapid development of cutaneous rash attributed to lenalidomide, inaddition to decreased renal function and now patient has persistent severe anemia. Side-effects were attributed to Lenalidomide and lenalidomide was discontinued with subsequent recovery.  Patient has been receiving bortezomib weekly with low-dose dexamethasone in 4-day cycles.  Patient has completed induction systemic therapy for his disease with repeat bone marrow biopsy confirming complete response including no evidence of minimal residual disease by cytogenetics or FISH.  HISTORY OF PRESENTING ILLNESS:   Robert Sparks is a wonderful 73 y.o. male who has been referred to [place] by my colleague Dr Grace Isaac for evaluation and management of Multiple Myeloma. The pt reports that he is doing well overall.   The pt has been previously followed by my colleague  Dr Grace Isaac. The pt was treated with Revlimid, Velcade, and dexamethasone and was subsequently switched to Velcade and Dexamethasone after a Revlimid intolerance.   The pt reports some knee aches and skin soreness in his legs which is worse at night. He notes that he has had diabetic-related peripheral neuropathy. The pt has not seen [facility] yet for a discussion of a BM transplant. He continues taking Acyclovir and notes no difficulty taking maintenance Velcade.   He adds that he takes Gabapentin and sweats.  He notes that his neuropathy is currently stable and denies it worsening.   He notes that a pack of cigarettes lasts him for about 3 days.   Most recent lab results (12/14/17) of CBC and CMP  is as follows: all values are WNL except for RBC at 3.08, HGB at 10.1, HCT at 31.0, MCV at 100.6, Eosinophils Abs at 1.4k, Potassium at 3.4, Glucose at 156, Creatinine at 1.60. LDH 12/14/17 is 141 Beta 2 microglobulin 12/14/17 is 2.4  On review of systems, pt reports knee pain, night time LE skin soreness, peripheral neuropathy, eating well, strong appetite, intermittent sweating and denies fevers, chills, back pains, abdominal pains, and any other symptoms.   [name] DM type 2, HTN, CKD. On Social Hx the pt reports smoking a pack of cigarettes every 3 days.   [name] returns today regarding management and evaluation of his Multiple Myeloma. [name] was on 01/13/2019. The pt reports that he is doing well overall.  The pt reports pain in his back when he walks. He was given muscle relaxers to help his back pain. He notes that the tingling/numbness in his hands and feet are a lot better  but he is still having some soreness in his feet. Pt would like to get a flu shot. He has been taking Cymbalta and Neurontin as prescribed.   Lab results today (04/15/19) of CBC w/diff and CMP is as follows: all values are WNL except for RBC at  3.15, Hgb at 10.8, HCT at 33.1, MCV at 105.1, MCH at 34.3, Eosinophils Abs at 1.4K, Glucose at 134, BUN at 24, Creatinine at 1.49, GFR Est AFR Am at 54. 04/15/2019 MMP is in progress   On review of systems, pt reports improving neuropathy in hands/feet, back pain, sore feet and denies abdominal pain, leg swelling and any other symptoms.   MEDICAL HISTORY:  Past Medical History:  Diagnosis Date  . Anemia   . Arthritis    shoulders,feet   . Cataract    per eye dr -has appt 5-11  . CKD (chronic kidney disease), stage III ([place])   . Elevated PSA   . History of ketoacidosis    03-29-2015   . History of sepsis    07-25-2013  non-compliant w/ medication  . Hyperlipidemia   . Hypertension   . Nocturia   . Peripheral neuropathy   . Prostate cancer ([place])   . Type 2 diabetes mellitus with insulin therapy ([place])     SURGICAL HISTORY: Past Surgical History:  Procedure Laterality Date  . CIRCUMCISION  1990's  . PROSTATE BIOPSY N/A 12/21/2015   Procedure: BIOPSY TRANSRECTAL ULTRASONIC PROSTATE (TUBP);  Surgeon: Festus Aloe, MD;  Location: [facility];  Service: Urology;  Laterality: N/A;    SOCIAL HISTORY: Social History   Socioeconomic History  . Marital status: Single    Spouse name: Not on file  . Number of children: Not on file  . Years of education: Not on file  . Highest education level: Not on file  Occupational History  . Not on file  Social Needs  . Financial resource strain: Not on file  . Food insecurity    Worry: Not on file    Inability: Not on file  . Transportation needs    Medical: Not on file    Non-medical: Not on file  Tobacco Use  . Smoking status: Current Every Day Smoker    Packs/day: 0.50    Years: 50.00    Pack years: 25.00    Types: Cigarettes  . Smokeless tobacco: Never Used  . Tobacco comment: 1 ppwk-4-5 a day   Substance and Sexual Activity  . Alcohol use: Yes    Alcohol/week: 1.0 standard drinks    Types: 1 Cans of beer  per week    Comment: 1 every day-quit couple weeks ago  . Drug use: No  . Sexual activity: Not Currently  Lifestyle  . Physical activity    Days per week: Not on file    Minutes per session: Not on file  . Stress: Not on file  Relationships  . Social Herbalist on phone: Not on file    Gets together: Not on file    Attends religious service: Not on file    Active member of club or organization: Not on file    Attends meetings of clubs or organizations: Not on file    Relationship status: Not on file  . Intimate partner violence    Fear of current or ex partner: Not on file    Emotionally abused: Not on file    Physically abused: Not on file    Forced sexual activity:  Not on file  Other Topics Concern  . Not on file  Social History Narrative  . Not on file    FAMILY HISTORY: Family History  Problem Relation Age of Onset  . Kidney disease Mother   . Cancer Neg Hx   . Colon cancer Neg Hx   . Colon polyps Neg Hx   . Esophageal cancer Neg Hx   . Rectal cancer Neg Hx   . Stomach cancer Neg Hx     ALLERGIES:  has No Known Allergies.  MEDICATIONS:  Current Outpatient Medications  Medication Sig Dispense Refill  . ACCU-CHEK AVIVA PLUS test strip 1 each by Other route See admin instructions. 100 each 12  . acetaminophen-codeine (TYLENOL #3) 300-30 MG tablet TAKE ONE TABLET BY MOUTH EVERY 8 HOURS AS NEEDED FOR MODERATE PAIN 60 tablet 0  . acyclovir (ZOVIRAX) 400 MG tablet Take 1 tablet (400 mg total) by mouth 2 (two) times daily. 60 tablet 5  . amLODipine (NORVASC) 5 MG tablet Take 1 tablet (5 mg total) by mouth daily. 90 tablet 3  . aspirin EC 81 MG tablet Take 1 tablet (81 mg total) by mouth daily. 90 tablet 3  . b complex vitamins capsule Take 1 capsule by mouth daily. 30 capsule 5  . Cholecalciferol (VITAMIN D) 2000 units CAPS Take 2,000 Units by mouth daily.    . cyclobenzaprine (FLEXERIL) 10 MG tablet Take 1 tablet (10 mg total) by mouth at bedtime. To relax  muscles of low back 30 tablet 5  . diclofenac sodium (VOLTAREN) 1 % GEL Apply 2 g topically 4 (four) times daily. 100 g 2  . donepezil (ARICEPT) 5 MG tablet Take 1 tablet (5 mg total) by mouth at bedtime. For memory issues 30 tablet 2  . DULoxetine (CYMBALTA) 30 MG capsule Take 1 capsule (30 mg total) by mouth daily. X 1 week then 60 mg daily- for nerve pain/back pain 60 capsule 5  . ferrous sulfate 325 (65 FE) MG tablet Take 325 mg by mouth daily.    [name] gabapentin (NEURONTIN) 300 MG capsule Take 1 cap in a.m and a lunch and 2 at bedtime 120 capsule 6  . insulin aspart protamine - aspart (NOVOLOG 70/30 MIX) (70-30) 100 UNIT/ML FlexPen Inject 0.07 mLs (7 Units total) into the skin 2 (two) times daily with a meal. 15 mL 6  . Insulin Pen Needle (TRUEPLUS PEN NEEDLES) 32G X 4 MM MISC Use as directed twice daily with insulin 100 each 3  . lidocaine (LIDODERM) 5 % Place 2 patches onto the skin every 12 (twelve) hours. place on each side of spine on low back 60 patch 5  . Multiple Vitamin (MULTIVITAMIN WITH MINERALS) TABS tablet Take 1 tablet by mouth daily. 60 tablet 2  . nicotine polacrilex (RA NICOTINE GUM) 2 MG gum Take 1 each (2 mg total) by mouth as needed for smoking cessation (limit to no more than 30 pieces/24 hr). 100 tablet 3  . NON FORMULARY [facility]   CREAMS- #11    . pravastatin (PRAVACHOL) 20 MG tablet TAKE 1 TABLET BY MOUTH DAILY. 90 tablet 0  . tadalafil (ADCIRCA/CIALIS) 20 MG tablet Take 20 mg by mouth daily as needed for erectile dysfunction.   0  . triamcinolone cream (KENALOG) 0.1 % Apply 1 application topically 3 (three) times daily as needed. 60 g 2  . vitamin B-12 (CYANOCOBALAMIN) 100 MCG tablet Take 100 mcg by mouth daily.     No current facility-administered medications for  this visit.     REVIEW OF SYSTEMS:    A 10+ POINT REVIEW OF SYSTEMS WAS OBTAINED including neurology, dermatology, psychiatry, cardiac, respiratory, lymph, extremities, GI, GU, Musculoskeletal,  constitutional, breasts, reproductive, HEENT.  All pertinent positives are noted in the HPI.  All others are negative.   PHYSICAL EXAMINATION: ECOG PERFORMANCE STATUS: 1 - Symptomatic but completely ambulatory Exam was given in a chair  .BP 127/73 (BP Location: Left Arm, Patient Position: Sitting)   Pulse 79   Temp 98.2 F (36.8 C) (Temporal)   Resp 18   Ht _0  (1.651 m)   Wt 159 lb 9.6 oz (72.4 kg)   SpO2 100%   BMI 26.56 kg/m   GENERAL:alert, in no acute distress and comfortable SKIN: no acute rashes, no significant lesions EYES: conjunctiva are pink and non-injected, sclera anicteric OROPHARYNX: MMM, no exudates, no oropharyngeal erythema or ulceration NECK: supple, no JVD LYMPH:  no palpable lymphadenopathy in the cervical, axillary or inguinal regions LUNGS: clear to auscultation b/l with normal respiratory effort HEART: regular rate & rhythm ABDOMEN:  normoactive bowel sounds , non tender, not distended. No palpable hepatosplenomegaly.  Extremity: no pedal edema PSYCH: alert & oriented x 3 with fluent speech NEURO: no focal motor/sensory deficits   LABORATORY DATA:  I have reviewed the data as listed  . CBC Latest Ref Rng & Units 04/15/2019 01/13/2019 11/11/2018  WBC 4.0 - 10.5 K/uL 5.9 6.9 6.0  Hemoglobin 13.0 - 17.0 g/dL 10.8(L) 11.0(L) 10.2(L)  Hematocrit 39.0 - 52.0 % 33.1(L) 32.6(L) 31.6(L)  Platelets 150 - 400 K/uL 207 208 156    . CMP Latest Ref Rng & Units 04/15/2019 01/13/2019 11/11/2018  Glucose 70 - 99 mg/dL 134(H) 104(H) 160(H)  BUN 8 - 23 mg/dL 24(H) 23 23  Creatinine 0.61 - 1.24 mg/dL 1.49(H) 1.45(H) 1.48(H)  Sodium 135 - 145 mmol/L 139 141 138  Potassium 3.5 - 5.1 mmol/L 4.0 3.9 3.8  Chloride 98 - 111 mmol/L 105 108 107  CO2 22 - 32 mmol/L 26 24 21(L)  Calcium 8.9 - 10.3 mg/dL 9.4 9.6 9.2  Total Protein 6.5 - 8.1 g/dL 7.0 7.2 6.9  Total Bilirubin 0.3 - 1.2 mg/dL 0.5 0.6 0.4  Alkaline Phos 38 - 126 U/L 99 57 70  AST 15 - 41 U/L _1 ALT 0 - 44  U/L 33 29 30   08/29/17 BM Bx:    08/29/17 Cytogenetics:   03/13/17 Cytogenetics:          RADIOGRAPHIC STUDIES: I have personally reviewed the radiological images as listed and agreed with the findings in the report. No results found.  ASSESSMENT & PLAN:   73 y.o. male with   1. IgA Lambda Multiple Myeloma ISS Stage I -Currently in remission  Active disease was previously diagnosed based on presence of anemia, kidney insufficiency, and paraproteinemia with significant predominance of lambda light chains as well as significant elevation of IgA.  PLAN:  -Discussed pt labwork today, 04/15/19; all values are WNL except for RBC at 3.15, Hgb at 10.8, HCT at 33.1, MCV at 105.1, MCH at 34.3, Eosinophils Abs at 1.4K, Glucose at 134, BUN at 24, Creatinine at 1.49, GFR Est AFR Am at 54. -Discussed 04/15/2019 MMP is in progress  -01/13/2019 K/L light chains is as follows: Kappa free light chain at 58.5, Lamda free light chains at 27.0, K/L light chain ratio at 2.17 -01/13/2019 MMP shows no M spike  -No lab or clinical indication of the return  of MM  -No indication to treat at this time  -Pt prefers to continue holding [place] and watching labs at this time -Could be element of Raynaud's as well and recommended keeping hands warm actively -Continue Gabapentin and Cymbalta per PCP -Continue Acyclovir  -Recommend using a cane when walking to prevent falls. Pt notes he has a cane and uses this sometimes. -Recommend pt get annual flu shot -Will see back in 2 months with labs   FOLLOW UP: RTC with Dr Irene Limbo with labs in 2 months   The total time spent in the appt was 15 minutes and more than 50% was on counseling and direct patient cares.  All of the patient's questions were answered with apparent satisfaction. The patient knows to call the clinic with any problems, questions or concerns.    Sullivan Lone MD [place] AAHIVMS [facility] Hematology/Oncology Physician [facility]   (Office):       [phone] (Work cell):  [phone] (Fax):           [phone]  04/15/2019 10:09 AM  I, Yevette Edwards, am acting as a scribe for Dr. Sullivan Lone.   .I have reviewed the above documentation for accuracy and completeness, and I agree with the above. Brunetta Genera MD

## 2019-04-15 ENCOUNTER — Inpatient Hospital Stay: Payer: Medicare HMO | Attending: Hematology

## 2019-04-15 ENCOUNTER — Inpatient Hospital Stay (HOSPITAL_BASED_OUTPATIENT_CLINIC_OR_DEPARTMENT_OTHER): Payer: Medicare HMO | Admitting: Hematology

## 2019-04-15 ENCOUNTER — Other Ambulatory Visit: Payer: Self-pay

## 2019-04-15 ENCOUNTER — Telehealth: Payer: Self-pay | Admitting: Hematology

## 2019-04-15 VITALS — BP 127/73 | HR 79 | Temp 98.2°F | Resp 18 | Ht 65.0 in | Wt 159.6 lb

## 2019-04-15 DIAGNOSIS — C9001 Multiple myeloma in remission: Secondary | ICD-10-CM | POA: Diagnosis not present

## 2019-04-15 LAB — CBC WITH DIFFERENTIAL/PLATELET
Abs Immature Granulocytes: 0.01 10*3/uL (ref 0.00–0.07)
Basophils Absolute: 0.1 10*3/uL (ref 0.0–0.1)
Basophils Relative: 1 %
Eosinophils Absolute: 1.4 10*3/uL — ABNORMAL HIGH (ref 0.0–0.5)
Eosinophils Relative: 24 %
HCT: 33.1 % — ABNORMAL LOW (ref 39.0–52.0)
Hemoglobin: 10.8 g/dL — ABNORMAL LOW (ref 13.0–17.0)
Immature Granulocytes: 0 %
Lymphocytes Relative: 26 %
Lymphs Abs: 1.5 10*3/uL (ref 0.7–4.0)
MCH: 34.3 pg — ABNORMAL HIGH (ref 26.0–34.0)
MCHC: 32.6 g/dL (ref 30.0–36.0)
MCV: 105.1 fL — ABNORMAL HIGH (ref 80.0–100.0)
Monocytes Absolute: 0.4 10*3/uL (ref 0.1–1.0)
Monocytes Relative: 7 %
Neutro Abs: 2.5 10*3/uL (ref 1.7–7.7)
Neutrophils Relative %: 42 %
Platelets: 207 10*3/uL (ref 150–400)
RBC: 3.15 MIL/uL — ABNORMAL LOW (ref 4.22–5.81)
RDW: 13 % (ref 11.5–15.5)
WBC: 5.9 10*3/uL (ref 4.0–10.5)
nRBC: 0 % (ref 0.0–0.2)

## 2019-04-15 LAB — CMP (CANCER CENTER ONLY)
ALT: 33 U/L (ref 0–44)
AST: 30 U/L (ref 15–41)
Albumin: 4.1 g/dL (ref 3.5–5.0)
Alkaline Phosphatase: 99 U/L (ref 38–126)
Anion gap: 8 (ref 5–15)
BUN: 24 mg/dL — ABNORMAL HIGH (ref 8–23)
CO2: 26 mmol/L (ref 22–32)
Calcium: 9.4 mg/dL (ref 8.9–10.3)
Chloride: 105 mmol/L (ref 98–111)
Creatinine: 1.49 mg/dL — ABNORMAL HIGH (ref 0.61–1.24)
GFR, Est AFR Am: 54 mL/min — ABNORMAL LOW (ref >60)
GFR, Estimated: 47 mL/min — ABNORMAL LOW (ref >60)
Glucose, Bld: 134 mg/dL — ABNORMAL HIGH (ref 70–99)
Potassium: 4 mmol/L (ref 3.5–5.1)
Sodium: 139 mmol/L (ref 135–145)
Total Bilirubin: 0.5 mg/dL (ref 0.3–1.2)
Total Protein: 7 g/dL (ref 6.5–8.1)

## 2019-04-15 NOTE — Telephone Encounter (Signed)
Scheduled appt per 10/6 los.  Sent a staf message to get a calendar mailed out.

## 2019-04-17 LAB — MULTIPLE MYELOMA PANEL, SERUM
Albumin SerPl Elph-Mcnc: 3.9 g/dL (ref 2.9–4.4)
Albumin/Glob SerPl: 1.6 (ref 0.7–1.7)
Alpha 1: 0.2 g/dL (ref 0.0–0.4)
Alpha2 Glob SerPl Elph-Mcnc: 0.8 g/dL (ref 0.4–1.0)
B-Globulin SerPl Elph-Mcnc: 0.7 g/dL (ref 0.7–1.3)
Gamma Glob SerPl Elph-Mcnc: 0.9 g/dL (ref 0.4–1.8)
Globulin, Total: 2.5 g/dL (ref 2.2–3.9)
IgA: 153 mg/dL (ref 61–437)
IgG (Immunoglobin G), Serum: 957 mg/dL (ref 603–1613)
IgM (Immunoglobulin M), Srm: 94 mg/dL (ref 15–143)
Total Protein ELP: 6.4 g/dL (ref 6.0–8.5)

## 2019-04-21 ENCOUNTER — Other Ambulatory Visit: Payer: Self-pay | Admitting: Internal Medicine

## 2019-04-21 ENCOUNTER — Telehealth: Payer: Self-pay | Admitting: Internal Medicine

## 2019-04-21 MED FILL — PRAVASTATIN SODIUM 20 MG TA: 20 | 90 days supply | Qty: 90 | Fill #0

## 2019-04-21 MED FILL — DONEPEZIL HCL 5 MG TABLET: 5 | 30 days supply | Qty: 30 | Fill #1

## 2019-04-21 MED FILL — TRUEPLUS PEN NDL 32GX5/32: 32G X 4 MM | 100 days supply | Qty: 100 | Fill #0

## 2019-04-21 NOTE — Telephone Encounter (Signed)
Called patient and LVM to return call and schedule a follow up appointment with his PCP.

## 2019-04-22 MED FILL — DULoxetine HCL 30 MG CPEP: 30 | 30 days supply | Qty: 60 | Fill #1

## 2019-04-22 MED FILL — GABAPENTIN 300 MG CAPSULE: 300 | 30 days supply | Qty: 120 | Fill #2

## 2019-04-25 ENCOUNTER — Encounter: Payer: Self-pay | Admitting: Internal Medicine

## 2019-04-25 ENCOUNTER — Other Ambulatory Visit: Payer: Self-pay

## 2019-04-25 ENCOUNTER — Ambulatory Visit: Payer: Medicare HMO | Attending: Internal Medicine | Admitting: Internal Medicine

## 2019-04-25 VITALS — BP 140/80 | HR 74 | Temp 98.1°F | Resp 16 | Wt 160.6 lb

## 2019-04-25 DIAGNOSIS — E1142 Type 2 diabetes mellitus with diabetic polyneuropathy: Secondary | ICD-10-CM

## 2019-04-25 DIAGNOSIS — E1169 Type 2 diabetes mellitus with other specified complication: Secondary | ICD-10-CM | POA: Diagnosis not present

## 2019-04-25 DIAGNOSIS — E1122 Type 2 diabetes mellitus with diabetic chronic kidney disease: Secondary | ICD-10-CM | POA: Diagnosis not present

## 2019-04-25 DIAGNOSIS — I10 Essential (primary) hypertension: Secondary | ICD-10-CM

## 2019-04-25 DIAGNOSIS — R413 Other amnesia: Secondary | ICD-10-CM

## 2019-04-25 DIAGNOSIS — F17210 Nicotine dependence, cigarettes, uncomplicated: Secondary | ICD-10-CM

## 2019-04-25 DIAGNOSIS — E785 Hyperlipidemia, unspecified: Secondary | ICD-10-CM | POA: Diagnosis not present

## 2019-04-25 DIAGNOSIS — Z794 Long term (current) use of insulin: Secondary | ICD-10-CM

## 2019-04-25 DIAGNOSIS — N1831 Chronic kidney disease, stage 3a: Secondary | ICD-10-CM

## 2019-04-25 DIAGNOSIS — M48062 Spinal stenosis, lumbar region with neurogenic claudication: Secondary | ICD-10-CM

## 2019-04-25 DIAGNOSIS — F17200 Nicotine dependence, unspecified, uncomplicated: Secondary | ICD-10-CM

## 2019-04-25 LAB — POCT GLYCOSYLATED HEMOGLOBIN (HGB A1C): HbA1c, POC (prediabetic range): 6.2 % (ref 5.7–6.4)

## 2019-04-25 LAB — GLUCOSE, POCT (MANUAL RESULT ENTRY): POC Glucose: 179 mg/dl — AB (ref 70–99)

## 2019-04-25 MED ORDER — AMLODIPINE BESYLATE 10 MG PO TABS: 10.0000 mg | ORAL_TABLET | Freq: Every day | ORAL | 6 refills | Status: DC

## 2019-04-25 MED ORDER — NICOTINE 14 MG/24HR TD PT24: 14.0000 mg | MEDICATED_PATCH | Freq: Every day | TRANSDERMAL | 1 refills | Status: DC

## 2019-04-25 MED FILL — AMLODIPINE BESYLATE 10 MG T: 10 | 30 days supply | Qty: 30 | Fill #0

## 2019-04-25 NOTE — Progress Notes (Signed)
Patient ID: Robert Sparks, male    DOB: [birth year]  MRN: [id number]  CC: Diabetes and Hypertension   Subjective: Robert Sparks is a 73 y.o. male who presents for chronic disease management.  Last evaluation 12/2018 His concerns today include:  Patient with history of HTN,DM2 with polyneuropathy,HL,CKD stage3, tob dependence, EtOH abuse, prostate CA,OA knee,vit B 12defand multiple myelomain remission  HM:  Had flu shot at [facility] on [address] last wk  DIABETES TYPE 2 Last A1C:   Results for orders placed or performed in visit on 04/25/19  POCT glucose (manual entry)  Result Value Ref Range   POC Glucose 179 (A) 70 - 99 mg/dl  POCT glycosylated hemoglobin (Hb A1C)  Result Value Ref Range   Hemoglobin A1C     HbA1c POC (<> result, manual entry)     HbA1c, POC (prediabetic range) 6.2 5.7 - 6.4 %   HbA1c, POC (controlled diabetic range)      Med Adherence:  '[x]'  Yes  -dec Novolog 70/30 to 6 units BID as discussed on last visit Medication side effects:  '[]'  Yes    '[x]'  No Home Monitoring?  '[x]'  Yes  -2-3 x a day.  Did not bring log or glucometer Home glucose results range: reports BS have been good Diet Adherence: '[x]'  Yes    '[]'  No Exercise: '[x]'  Yes  -still uses exercise bike daily Hypoglycemic episodes?: '[]'  Yes    '[x]'  No further episodes Numbness of the feet? '[]'  Yes    '[x]'  No but soles very sore and sensitive. Retinopathy hx? '[]'  Yes    '[x]'  No Last eye exam: Up-to-date. Comments: Sees his podiatrist every 3 months to have calluses shaved and toenails clipped.  HYPERTENSION Currently taking: see medication list Med Adherence: '[x]'  Yes    '[]'  No Medication side effects: '[]'  Yes    '[x]'  No Adherence with salt restriction: '[x]'  Yes "a little bit."    '[]'  No Home Monitoring?: '[x]'  Yes    '[]'  No Monitoring Frequency:  daily Home BP results range:  forgot SOB? '[]'  Yes    '[x]'  No Chest Pain?: '[]'  Yes    '[x]'  No Leg swelling?: '[]'  Yes    '[x]'  No Headaches?: '[]'  Yes    '[x]'   No Dizziness? '[x]'  Yes - occasionally when he stands     Comments:   Declining Memory:  Started on Aricept by PMR Dr. Dagoberto Ligas whom he saw 03/14/2019 for back pain/spinal stenosis.  She thinks he may have mild dementia.  No nausea on the Aricept.  Reports occasional dizziness if he stands up too quickly.  So far he does not feel the Aricept has helped with his memory.  He has history of B12 deficiency and reports compliance with taking the supplement.  Chronic LBP/spinal stenosis: MRI done in June of this year and revealed bulging disc and some spinal stenosis and moderate left greater than right facet hypertrophy at L4-5.  I referred him to PMR.  Saw Dr. Dagoberto Ligas last mth.  She started him on lidocaine pain patches along with 10 mg of Flexeril to use at night.  She also increase the Cymbalta to 60 mg.  So far he feels these measures have helped but "I still get tired in my back."   Sleeps well but has "crazy dreams."  Hx of ETOH use disorder:  "I drink a beer every now and again but nothing like I use to."  Last drink one beer 3 wks ago.  Tobacco dependence: He  is smoking about 1/3 pack a day.  States that he would like to quit but is finding it difficult.  He has tried the nicotine gum but has not found them very helpful.  He tells me that he used the nicotine patches in the past when he was in the nursing home and found that they help.  He will be willing to try them again.   Patient Active Problem List   Diagnosis Date Noted  . Dementia associated with other underlying disease without behavioral disturbance ([place]) 03/14/2019  . Chronic bilateral low back pain without sciatica 03/14/2019  . Spinal stenosis of lumbar region without neurogenic claudication 03/14/2019  . Myofascial muscle pain 03/14/2019  . History of prostate cancer 12/20/2018  . Neuropathy of right hand 12/20/2018  . HCAP (healthcare-associated pneumonia) 05/02/2018  . Cough   . Primary osteoarthritis of right knee 01/07/2018  .  Diabetic polyneuropathy associated with type 2 diabetes mellitus ([place]) 01/07/2018  . Vitamin B12 deficiency 09/04/2017  . Pre-ulcerative corn or callous 04/12/2017  . Cataract of both eyes 04/12/2017  . Tobacco dependence 04/12/2017  . Hypokalemia 03/23/2017  . Multiple myeloma in remission ([place]) 02/23/2017  . Oral leukoplakia 02/09/2017  . Lightheadedness 11/10/2016  . Tobacco abuse 06/21/2016  . Hyperlipidemia 06/21/2016  . CKD (chronic kidney disease) stage 3, GFR 30-59 ml/min 02/23/2016  . Malignant neoplasm of prostate ([place]) 02/21/2016  . Controlled type 2 diabetes mellitus with stage 3 chronic kidney disease, with long-term current use of insulin ([place]) 03/29/2015  . HTN (hypertension) 07/28/2013  . Anemia, chronic disease 07/26/2013  . Sepsis ([place]) 07/25/2013  . ETOH abuse 07/25/2013     Current Outpatient Medications on File Prior to Visit  Medication Sig Dispense Refill  . ACCU-CHEK AVIVA PLUS test strip 1 each by Other route See admin instructions. 100 each 12  . acetaminophen-codeine (TYLENOL #3) 300-30 MG tablet TAKE ONE TABLET BY MOUTH EVERY 8 HOURS AS NEEDED FOR MODERATE PAIN 60 tablet 0  . acyclovir (ZOVIRAX) 400 MG tablet Take 1 tablet (400 mg total) by mouth 2 (two) times daily. 60 tablet 5  . amLODipine (NORVASC) 5 MG tablet Take 1 tablet (5 mg total) by mouth daily. 90 tablet 3  . aspirin EC 81 MG tablet Take 1 tablet (81 mg total) by mouth daily. 90 tablet 3  . b complex vitamins capsule Take 1 capsule by mouth daily. 30 capsule 5  . Cholecalciferol (VITAMIN D) 2000 units CAPS Take 2,000 Units by mouth daily.    . cyclobenzaprine (FLEXERIL) 10 MG tablet Take 1 tablet (10 mg total) by mouth at bedtime. To relax muscles of low back 30 tablet 5  . diclofenac sodium (VOLTAREN) 1 % GEL Apply 2 g topically 4 (four) times daily. 100 g 2  . donepezil (ARICEPT) 5 MG tablet Take 1 tablet (5 mg total) by mouth at bedtime. For memory issues 30 tablet 2  . DULoxetine (CYMBALTA)  30 MG capsule Take 1 capsule (30 mg total) by mouth daily. X 1 week then 60 mg daily- for nerve pain/back pain 60 capsule 5  . ferrous sulfate 325 (65 FE) MG tablet Take 325 mg by mouth daily.    [name] gabapentin (NEURONTIN) 300 MG capsule Take 1 cap in a.m and a lunch and 2 at bedtime 120 capsule 6  . insulin aspart protamine - aspart (NOVOLOG 70/30 MIX) (70-30) 100 UNIT/ML FlexPen Inject 0.07 mLs (7 Units total) into the skin 2 (two) times daily with a meal. 15  mL 6  . lidocaine (LIDODERM) 5 % Place 2 patches onto the skin every 12 (twelve) hours. place on each side of spine on low back 60 patch 5  . Multiple Vitamin (MULTIVITAMIN WITH MINERALS) TABS tablet Take 1 tablet by mouth daily. 60 tablet 2  . nicotine polacrilex (RA NICOTINE GUM) 2 MG gum Take 1 each (2 mg total) by mouth as needed for smoking cessation (limit to no more than 30 pieces/24 hr). 100 tablet 3  . NON FORMULARY [facility]   CREAMS- #11    . pravastatin (PRAVACHOL) 20 MG tablet TAKE 1 TABLET BY MOUTH DAILY. 90 tablet 0  . tadalafil (ADCIRCA/CIALIS) 20 MG tablet Take 20 mg by mouth daily as needed for erectile dysfunction.   0  . triamcinolone cream (KENALOG) 0.1 % Apply 1 application topically 3 (three) times daily as needed. 60 g 2  . TRUEPLUS PEN NEEDLES 32G X 4 MM MISC USE AS DIRECTED TWICE DAILY WITH INSULIN 100 each 0  . vitamin B-12 (CYANOCOBALAMIN) 100 MCG tablet Take 100 mcg by mouth daily.     No current facility-administered medications on file prior to visit.     No Known Allergies  Social History   Socioeconomic History  . Marital status: Single    Spouse name: Not on file  . Number of children: Not on file  . Years of education: Not on file  . Highest education level: Not on file  Occupational History  . Not on file  Social Needs  . Financial resource strain: Not on file  . Food insecurity    Worry: Not on file    Inability: Not on file  . Transportation needs    Medical: Not on file     Non-medical: Not on file  Tobacco Use  . Smoking status: Current Every Day Smoker    Packs/day: 0.50    Years: 50.00    Pack years: 25.00    Types: Cigarettes  . Smokeless tobacco: Never Used  . Tobacco comment: 1 ppwk-4-5 a day   Substance and Sexual Activity  . Alcohol use: Yes    Alcohol/week: 1.0 standard drinks    Types: 1 Cans of beer per week    Comment: 1 every day-quit couple weeks ago  . Drug use: No  . Sexual activity: Not Currently  Lifestyle  . Physical activity    Days per week: Not on file    Minutes per session: Not on file  . Stress: Not on file  Relationships  . Social Herbalist on phone: Not on file    Gets together: Not on file    Attends religious service: Not on file    Active member of club or organization: Not on file    Attends meetings of clubs or organizations: Not on file    Relationship status: Not on file  . Intimate partner violence    Fear of current or ex partner: Not on file    Emotionally abused: Not on file    Physically abused: Not on file    Forced sexual activity: Not on file  Other Topics Concern  . Not on file  Social History Narrative  . Not on file    Family History  Problem Relation Age of Onset  . Kidney disease Mother   . Cancer Neg Hx   . Colon cancer Neg Hx   . Colon polyps Neg Hx   . Esophageal cancer Neg Hx   .  Rectal cancer Neg Hx   . Stomach cancer Neg Hx     Past Surgical History:  Procedure Laterality Date  . CIRCUMCISION  1990's  . PROSTATE BIOPSY N/A 12/21/2015   Procedure: BIOPSY TRANSRECTAL ULTRASONIC PROSTATE (TUBP);  Surgeon: Festus Aloe, MD;  Location: [facility];  Service: Urology;  Laterality: N/A;    ROS: Review of Systems Negative except as stated above  PHYSICAL EXAM: BP (!) 141/79   Pulse 74   Temp 98.1 F (36.7 C) (Oral)   Resp 16   Wt 160 lb 9.6 oz (72.8 kg)   SpO2 98%   BMI 26.73 kg/m   Wt Readings from Last 3 Encounters:  04/25/19 160 lb 9.6 oz  (72.8 kg)  04/15/19 159 lb 9.6 oz (72.4 kg)  03/14/19 159 lb (72.1 kg)    Physical Exam General appearance - alert, well appearing, elderly male and in no distress Mental status -he was oriented to person and place.   Mouth - mucous membranes moist, pharynx normal without lesions Neck - supple, no significant adenopathy Chest - clear to auscultation, no wheezes, rales or rhonchi, symmetric air entry Heart - normal rate, regular rhythm, normal S1, S2, no murmurs, rubs, clicks or gallops Extremities -no lower extremity edema.  He has good radial, dorsalis pedis and posterior tibialis pulses MMSE - Mini Mental State Exam 04/25/2019  Orientation to time 5  Orientation to Place 5  Registration 3  Attention/ Calculation 0  Recall 0  Language- name 2 objects 2  Language- repeat 1  Language- follow 3 step command 3  Language- read & follow direction 1  Write a sentence 1  Copy design 1  Total score 22    Diabetic Foot Exam - Simple   Simple Foot Form Visual Inspection See comments: Yes Sensation Testing See comments: Yes Pulse Check Posterior Tibialis and Dorsalis pulse intact bilaterally: Yes Comments Patient has pre-ulcerative callus on the plantar surface of the right big toe and the ball of the left big toe.  Toenails are thick and slightly overgrown.  Decreased sensation on the soles of both feet.     CMP Latest Ref Rng & Units 04/15/2019 01/13/2019 11/11/2018  Glucose 70 - 99 mg/dL 134(H) 104(H) 160(H)  BUN 8 - 23 mg/dL 24(H) 23 23  Creatinine 0.61 - 1.24 mg/dL 1.49(H) 1.45(H) 1.48(H)  Sodium 135 - 145 mmol/L 139 141 138  Potassium 3.5 - 5.1 mmol/L 4.0 3.9 3.8  Chloride 98 - 111 mmol/L 105 108 107  CO2 22 - 32 mmol/L 26 24 21(L)  Calcium 8.9 - 10.3 mg/dL 9.4 9.6 9.2  Total Protein 6.5 - 8.1 g/dL 7.0 7.2 6.9  Total Bilirubin 0.3 - 1.2 mg/dL 0.5 0.6 0.4  Alkaline Phos 38 - 126 U/L 99 57 70  AST 15 - 41 U/L '30 23 23  ' ALT 0 - 44 U/L 33 29 30   Lipid Panel     Component  Value Date/Time   CHOL 204 (H) 03/21/2018 1154   TRIG 143 03/21/2018 1154   HDL 68 03/21/2018 1154   CHOLHDL 3.0 03/21/2018 1154   CHOLHDL 3.6 06/21/2016 1047   VLDL 50 (H) 06/21/2016 1047   LDLCALC 107 (H) 03/21/2018 1154    CBC    Component Value Date/Time   WBC 5.9 04/15/2019 0958   RBC 3.15 (L) 04/15/2019 0958   HGB 10.8 (L) 04/15/2019 0958   HGB 11.0 (L) 01/13/2019 1049   HGB 12.2 (L) 12/06/2017 0920   HGB  10.1 (L) 07/13/2017 0938   HCT 33.1 (L) 04/15/2019 0958   HCT 37.1 (L) 12/06/2017 0920   HCT 30.6 (L) 07/13/2017 0938   PLT 207 04/15/2019 0958   PLT 208 01/13/2019 1049   PLT 250 12/06/2017 0920   MCV 105.1 (H) 04/15/2019 0958   MCV 98 (H) 12/06/2017 0920   MCV 96.2 07/13/2017 0938   MCH 34.3 (H) 04/15/2019 0958   MCHC 32.6 04/15/2019 0958   RDW 13.0 04/15/2019 0958   RDW 14.1 12/06/2017 0920   RDW 14.4 07/13/2017 0938   LYMPHSABS 1.5 04/15/2019 0958   LYMPHSABS 0.8 (L) 07/13/2017 0938   MONOABS 0.4 04/15/2019 0958   MONOABS 0.4 07/13/2017 0938   EOSABS 1.4 (H) 04/15/2019 0958   EOSABS 0.5 07/13/2017 0938   EOSABS 0.8 (H) 11/21/2016 1416   BASOSABS 0.1 04/15/2019 0958   BASOSABS 0.0 07/13/2017 0938    ASSESSMENT AND PLAN:  1. Type 2 diabetes mellitus with diabetic polyneuropathy, with long-term current use of insulin (HCC) At goal.  He will continue current dose of NovoLog 70/30 6 units twice a day with meals.  Continue to encourage healthy eating habits.  Encouraged him to stay active.  He will continue to follow-up with his podiatrist for diabetic foot care and to have his calluses shaved - POCT glucose (manual entry) - POCT glycosylated hemoglobin (Hb A1C) - Microalbumin / creatinine urine ratio  2. Essential hypertension Not at goal.  Increase amlodipine to 10 mg daily. - amLODipine (NORVASC) 10 MG tablet; Take 1 tablet (10 mg total) by mouth daily.  Dispense: 30 tablet; Refill: 6  3. Spinal stenosis, lumbar region with neurogenic claudication I  appreciate PMR input.  Patient will continue the Lidoderm patch.  4. Memory changes MMSE score today indicates he may have mild dementia Continue Aricept. - Vitamin B12 - TSH  5. Stage 3a chronic kidney disease Stable.  6. Hyperlipidemia associated with type 2 diabetes mellitus ([place]) - Lipid panel  7. Tobacco dependence Advised to quit.  Discussed health risks associated with smoking.  Patient wanting to try nicotine patches.  Less than 5 minutes spent on counseling. - nicotine (NICODERM CQ - DOSED IN MG/24 HOURS) 14 mg/24hr patch; Place 1 patch (14 mg total) onto the skin daily.  Dispense: 28 patch; Refill: 1    Patient was given the opportunity to ask questions.  Patient verbalized understanding of the plan and was able to repeat key elements of the plan.   Orders Placed This Encounter  Procedures  . Microalbumin / creatinine urine ratio  . POCT glucose (manual entry)  . POCT glycosylated hemoglobin (Hb A1C)     Requested Prescriptions    No prescriptions requested or ordered in this encounter    No follow-ups on file.  Karle Plumber, MD, FACP

## 2019-04-25 NOTE — Patient Instructions (Signed)
Increase Amlodipine to 10 mg daily.  Start the nicotine patches and use daily as discussed.

## 2019-04-26 LAB — LIPID PANEL
Chol/HDL Ratio: 2.1 ratio (ref 0.0–5.0)
Cholesterol, Total: 184 mg/dL (ref 100–199)
HDL: 88 mg/dL (ref >39)
LDL Chol Calc (NIH): 76 mg/dL (ref 0–99)
Triglycerides: 114 mg/dL (ref 0–149)
VLDL Cholesterol Cal: 20 mg/dL (ref 5–40)

## 2019-04-26 LAB — MICROALBUMIN / CREATININE URINE RATIO
Creatinine, Urine: 140.8 mg/dL
Microalb/Creat Ratio: 267 mg/g creat — ABNORMAL HIGH (ref 0–29)
Microalbumin, Urine: 376 ug/mL

## 2019-04-26 LAB — VITAMIN B12: Vitamin B-12: >2000 pg/mL — ABNORMAL HIGH (ref 232–1245)

## 2019-04-26 LAB — TSH: TSH: 2.1 u[IU]/mL (ref 0.450–4.500)

## 2019-04-27 ENCOUNTER — Encounter: Payer: Self-pay | Admitting: Internal Medicine

## 2019-04-27 DIAGNOSIS — R809 Proteinuria, unspecified: Secondary | ICD-10-CM | POA: Insufficient documentation

## 2019-04-28 MED FILL — ACCU-CHEK AVIVA PLUS STRP: 23 days supply | Qty: 100 | Fill #7

## 2019-04-29 ENCOUNTER — Telehealth: Payer: Self-pay

## 2019-04-29 NOTE — Telephone Encounter (Signed)
Contacted pt to go over lab results pt didn't answer lvm asking pt to give me a call at his earliest convenience  

## 2019-05-07 ENCOUNTER — Ambulatory Visit (INDEPENDENT_AMBULATORY_CARE_PROVIDER_SITE_OTHER): Payer: Medicare HMO | Admitting: Podiatry

## 2019-05-07 ENCOUNTER — Other Ambulatory Visit: Payer: Self-pay

## 2019-05-07 ENCOUNTER — Encounter: Payer: Self-pay | Admitting: Podiatry

## 2019-05-07 DIAGNOSIS — E1142 Type 2 diabetes mellitus with diabetic polyneuropathy: Secondary | ICD-10-CM

## 2019-05-07 DIAGNOSIS — M79674 Pain in right toe(s): Secondary | ICD-10-CM | POA: Diagnosis not present

## 2019-05-07 DIAGNOSIS — L84 Corns and callosities: Secondary | ICD-10-CM | POA: Diagnosis not present

## 2019-05-07 DIAGNOSIS — B351 Tinea unguium: Secondary | ICD-10-CM | POA: Diagnosis not present

## 2019-05-07 DIAGNOSIS — M79675 Pain in left toe(s): Secondary | ICD-10-CM | POA: Diagnosis not present

## 2019-05-07 NOTE — Patient Instructions (Addendum)
[facility] [phone]   [email or web address]    Diabetic Neuropathy Diabetic neuropathy refers to nerve damage that is caused by diabetes (diabetes mellitus). Over time, people with diabetes can develop nerve damage throughout the body. There are several types of diabetic neuropathy:  Peripheral neuropathy. This is the most common type of diabetic neuropathy. It causes damage to nerves that carry signals between the spinal cord and other parts of the body (peripheral nerves). This usually affects nerves in the feet and legs first, and may eventually affect the hands and arms. The damage affects the ability to sense touch or temperature.  Autonomic neuropathy. This type causes damage to nerves that control involuntary functions (autonomic nerves). These nerves carry signals that control: ? Heartbeat. ? Body temperature. ? Blood pressure. ? Urination. ? Digestion. ? Sweating. ? Sexual function. ? Response to changing blood sugar (glucose) levels.  Focal neuropathy. This type of nerve damage affects one area of the body, such as an arm, a leg, or the face. The injury may involve one nerve or a small group of nerves. Focal neuropathy can be painful and unpredictable, and occurs most often in older adults with diabetes. This often develops suddenly, but usually improves over time and does not cause long-term problems.  Proximal neuropathy. This type of nerve damage affects the nerves of the thighs, hips, buttocks, or legs. It causes severe pain, weakness, and muscle death (atrophy), usually in the thigh muscles. It is more common among older men and people who have type 2 diabetes. The length of recovery time may vary. What are the causes? Peripheral, autonomic, and focal neuropathies are caused by diabetes that is not well controlled with treatment. The cause of proximal neuropathy is not known, but it may be caused by inflammation related to uncontrolled blood glucose levels.  What are the signs or symptoms? Peripheral neuropathy Peripheral neuropathy develops slowly over time. When the nerves of the feet and legs no longer work, you may experience:  Burning, stabbing, or aching pain in the legs or feet.  Pain or cramping in the legs or feet.  Loss of feeling (numbness) and inability to feel pressure or pain in the feet. This can lead to: ? Thick calluses or sores on areas of constant pressure. ? Ulcers. ? Reduced ability to feel temperature changes.  Foot deformities.  Muscle weakness.  Loss of balance or coordination. Autonomic neuropathy The symptoms of autonomic neuropathy vary depending on which nerves are affected. Symptoms may include:  Problems with digestion, such as: ? Nausea or vomiting. ? Poor appetite. ? Bloating. ? Diarrhea or constipation. ? Trouble swallowing. ? Losing weight without trying to.  Problems with the heart, blood and lungs, such as: ? Dizziness, especially when standing up. ? Fainting. ? Shortness of breath. ? Irregular heartbeat.  Bladder problems, such as: ? Trouble starting or stopping urination. ? Leaking urine. ? Trouble emptying the bladder. ? Urinary tract infections (UTIs).  Problems with other body functions, such as: ? Sweat. You may sweat too much or too little. ? Temperature. You might get hot easily. Or, you might feel cold more than usual. ? Sexual function. Men may not be able to get or maintain an erection. Women may have vaginal dryness and difficulty with arousal. Focal neuropathy Symptoms affect only one area of the body. Common symptoms include:  Numbness.  Tingling.  Burning pain.  Prickling feeling.  Very sensitive skin.  Weakness.  Inability to move (paralysis).  Muscle twitching.  Muscles getting smaller (wasting).    Poor coordination.  Double or blurred vision. Proximal neuropathy  Sudden, severe pain in the hip, thigh, or buttocks. Pain may spread from the back  into the legs (sciatica).  Pain and numbness in the arms and legs.  Tingling.  Loss of bladder control or bowel control.  Weakness and wasting of thigh muscles.  Difficulty getting up from a seated position.  Abdominal swelling.  Unexplained weight loss. How is this diagnosed? Diagnosis usually involves reviewing your medical history and any symptoms you have. Diagnosis varies depending on the type of neuropathy your health care provider suspects. Peripheral neuropathy Your health care provider will check areas that are affected by your nervous system (neurologic exam), such as your reflexes, how you move, and what you can feel. You may have other tests, such as:  Blood tests.  Removal and examination of fluid that surrounds the spinal cord (lumbar puncture).  CT scan.  MRI.  A test to check the nerves that control muscles (electromyogram, EMG).  Tests of how quickly messages pass through your nerves (nerve conduction velocity tests).  Removal of a small piece of nerve to be examined under a microscope (biopsy). Autonomic neuropathy You may have tests, such as:  Tests to measure your blood pressure and heart rate. This may include monitoring you while you are safely secured to an exam table that moves you from a lying position to an upright position (table tilt test).  Breathing tests to check your lungs.  Tests to check how food moves through the digestive system (gastric emptying tests).  Blood, sweat, or urine tests.  Ultrasound of your bladder.  Spinal fluid tests. Focal neuropathy This condition may be diagnosed with:  A neurologic exam.  CT scan.  MRI.  EMG.  Nerve conduction velocity tests. Proximal neuropathy There is no test to diagnose this type of neuropathy. You may have tests to rule out other possible causes of this type of neuropathy. Tests may include:  X-rays of your spine and lumbar region.  Lumbar puncture.  MRI. How is this  treated? The goal of treatment is to keep nerve damage from getting worse. The most important part of treatment is keeping your blood glucose level and your A1C level within your target range by following your diabetes management plan. Over time, maintaining lower blood glucose levels helps lessen symptoms. In some cases, you may need prescription pain medicine. Follow these instructions at home:  Lifestyle   Do not use any products that contain nicotine or tobacco, such as cigarettes and e-cigarettes. If you need help quitting, ask your health care provider.  Be physically active every day. Include strength training and balance exercises.  Follow a healthy meal plan.  Work with your health care provider to manage your blood pressure. General instructions  Follow your diabetes management plan as directed. ? Check your blood glucose levels as directed by your health care provider. ? Keep your blood glucose in your target range as directed by your health care provider. ? Have your A1C level checked at least two times a year, or as often as told by your health care provider.  Take over the counter and prescription medicines only as told by your health care provider. This includes insulin and diabetes medicine.  Do not drive or use heavy machinery while taking prescription pain medicines.  Check your skin and feet every day for cuts, bruises, redness, blisters, or sores.  Keep all follow up visits as told by your health care provider. This is   important. Contact a health care provider if:  You have burning, stabbing, or aching pain in your legs or feet.  You are unable to feel pressure or pain in your feet.  You develop problems with digestion, such as: ? Nausea. ? Vomiting. ? Bloating. ? Constipation. ? Diarrhea. ? Abdominal pain.  You have difficulty with urination, such as inability: ? To control when you urinate (incontinence). ? To completely empty the bladder (retention).   You have palpitations.  You feel dizzy, weak, or faint when you stand up. Get help right away if:  You cannot urinate.  You have sudden weakness or loss of coordination.  You have trouble speaking.  You have pain or pressure in your chest.  You have an irregular heart beat.  You have sudden inability to move a part of your body. Summary  Diabetic neuropathy refers to nerve damage that is caused by diabetes. It can affect nerves throughout the entire body, causing numbness and pain in the arms, legs, digestive tract, heart, and other body systems.  Keep your blood glucose level and your blood pressure in your target range, as directed by your health care provider. This can help prevent neuropathy from getting worse.  Check your skin and feet every day for cuts, bruises, redness, blisters, or sores.  Do not use any products that contain nicotine or tobacco, such as cigarettes and e-cigarettes. If you need help quitting, ask your health care provider. This information is not intended to replace advice given to you by your health care provider. Make sure you discuss any questions you have with your health care provider. Document Released: 09/04/2001 Document Revised: 08/08/2017 Document Reviewed: 07/31/2016 Elsevier Patient Education  2020 [employer]  

## 2019-05-09 DIAGNOSIS — N183 Chronic kidney disease, stage 3 unspecified: Secondary | ICD-10-CM | POA: Diagnosis not present

## 2019-05-10 NOTE — Progress Notes (Addendum)
Subjective: Robert Sparks presents to clinic with cc chronic porokeratotic lesion right hallux and painful mycotic toenails which are aggravated when weightbearing with and without shoe gear.  This pain limits his daily activities. Pain symptoms resolve with periodic professional debridement.  He states he never received prescription for his toenails from [place].   Current Outpatient Medications on File Prior to Visit  Medication Sig Dispense Refill  . ACCU-CHEK AVIVA PLUS test strip 1 each by Other route See admin instructions. 100 each 12  . acetaminophen-codeine (TYLENOL #3) 300-30 MG tablet TAKE ONE TABLET BY MOUTH EVERY 8 HOURS AS NEEDED FOR MODERATE PAIN 60 tablet 0  . acyclovir (ZOVIRAX) 400 MG tablet Take 1 tablet (400 mg total) by mouth 2 (two) times daily. 60 tablet 5  . amLODipine (NORVASC) 10 MG tablet Take 1 tablet (10 mg total) by mouth daily. 30 tablet 6  . aspirin EC 81 MG tablet Take 1 tablet (81 mg total) by mouth daily. 90 tablet 3  . b complex vitamins capsule Take 1 capsule by mouth daily. 30 capsule 5  . Cholecalciferol (VITAMIN D) 2000 units CAPS Take 2,000 Units by mouth daily.    . cyclobenzaprine (FLEXERIL) 10 MG tablet Take 1 tablet (10 mg total) by mouth at bedtime. To relax muscles of low back 30 tablet 5  . diclofenac sodium (VOLTAREN) 1 % GEL Apply 2 g topically 4 (four) times daily. 100 g 2  . donepezil (ARICEPT) 5 MG tablet Take 1 tablet (5 mg total) by mouth at bedtime. For memory issues 30 tablet 2  . DULoxetine (CYMBALTA) 30 MG capsule Take 1 capsule (30 mg total) by mouth daily. X 1 week then 60 mg daily- for nerve pain/back pain 60 capsule 5  . ferrous sulfate 325 (65 FE) MG tablet Take 325 mg by mouth daily.    [name] gabapentin (NEURONTIN) 300 MG capsule Take 1 cap in a.m and a lunch and 2 at bedtime 120 capsule 6  . insulin aspart protamine - aspart (NOVOLOG 70/30 MIX) (70-30) 100 UNIT/ML FlexPen Inject 0.07 mLs (7 Units total) into the skin 2  (two) times daily with a meal. 15 mL 6  . lidocaine (LIDODERM) 5 % Place 2 patches onto the skin every 12 (twelve) hours. place on each side of spine on low back 60 patch 5  . Multiple Vitamin (MULTIVITAMIN WITH MINERALS) TABS tablet Take 1 tablet by mouth daily. 60 tablet 2  . nicotine (NICODERM CQ - DOSED IN MG/24 HOURS) 14 mg/24hr patch Place 1 patch (14 mg total) onto the skin daily. 28 patch 1  . nicotine polacrilex (RA NICOTINE GUM) 2 MG gum Take 1 each (2 mg total) by mouth as needed for smoking cessation (limit to no more than 30 pieces/24 hr). 100 tablet 3  . NON FORMULARY [facility]   CREAMS- #11    . pravastatin (PRAVACHOL) 20 MG tablet TAKE 1 TABLET BY MOUTH DAILY. 90 tablet 0  . tadalafil (ADCIRCA/CIALIS) 20 MG tablet Take 20 mg by mouth daily as needed for erectile dysfunction.   0  . triamcinolone cream (KENALOG) 0.1 % Apply 1 application topically 3 (three) times daily as needed. 60 g 2  . TRUEPLUS PEN NEEDLES 32G X 4 MM MISC USE AS DIRECTED TWICE DAILY WITH INSULIN 100 each 0  . vitamin B-12 (CYANOCOBALAMIN) 100 MCG tablet Take 100 mcg by mouth daily.     No current facility-administered medications on file prior to visit.  No Known Allergies   Objective: There were no vitals filed for this visit.  Physical Examination:  73 yo AAM, WD, WN in NAD. AAO x 3.  Vascular  Examination: Capillary refill time immediate b/l.   Palpable DP/PT pulses b/l.  Digital hair absent b/l.  No edema noted b/l.  Skin temperature gradient WNL b/l.  Dermatological Examination: Skin with normal turgor, texture and tone b/l.  No open wounds b/l.  No interdigital macerations noted b/l.  Elongated, thick, discolored brittle toenails with subungual debris and pain on dorsal palpation of nailbeds 1-5 b/l.  Hyperkeratotic lesion 1st metatarsal head left foot with tenderness to palpation. No edema, no erythema, no drainage, no flocculence.  Porokeratotic lesion plantar  aspect right hallux with tenderness to palpation. No erythema, no edema, no drainage, no flocculence.   Musculoskeletal Examination: Muscle strength 5/5 to all muscle groups b/l.  No pain, crepitus or joint discomfort with active/passive ROM.  Neurological Examination: Sensation absent b/l with 10 gram monofilament.  Assessment: 1. Mycotic nail infection with pain 1-5 b/l 2. Porokeratotic lesion right hallux 3. Callus 1st metatarsal head left foot 4. NIDDM with neuropathy  Plan: 1. Toenails 1-5 b/l were debrided in length and girth without iatrogenic laceration. 2. Regarding his prescription for topical compounded antifungal solution for his toenails from [place], there is an existing prescription in his medication profile. He was given the phone number to [facility] to inquire about his prescription.  3. Porokeratosis right hallux pared and enucleated with sterile scalpel blade without incident. Calluses pared 1st metatarsal head left foot utilizing sterile scalpel blade without incident.. 4. Continue soft, supportive shoe gear daily. 5. Report any pedal injuries to medical professional. 6. Follow up 3 months. 7. Patient/POA to call should there be a question/concern in there interim.

## 2019-05-12 MED FILL — CYCLOBENZAPRINE 10 MG TAB: 10 | 30 days supply | Qty: 30 | Fill #0

## 2019-05-14 ENCOUNTER — Encounter: Payer: Medicare HMO | Admitting: Physical Medicine and Rehabilitation

## 2019-05-14 DIAGNOSIS — N183 Chronic kidney disease, stage 3 unspecified: Secondary | ICD-10-CM | POA: Diagnosis not present

## 2019-05-14 DIAGNOSIS — C9000 Multiple myeloma not having achieved remission: Secondary | ICD-10-CM | POA: Diagnosis not present

## 2019-05-14 DIAGNOSIS — D631 Anemia in chronic kidney disease: Secondary | ICD-10-CM | POA: Diagnosis not present

## 2019-05-14 DIAGNOSIS — I129 Hypertensive chronic kidney disease with stage 1 through stage 4 chronic kidney disease, or unspecified chronic kidney disease: Secondary | ICD-10-CM | POA: Diagnosis not present

## 2019-05-14 DIAGNOSIS — Z1159 Encounter for screening for other viral diseases: Secondary | ICD-10-CM | POA: Diagnosis not present

## 2019-05-14 DIAGNOSIS — N2581 Secondary hyperparathyroidism of renal origin: Secondary | ICD-10-CM | POA: Diagnosis not present

## 2019-05-15 ENCOUNTER — Ambulatory Visit: Payer: Medicare HMO | Admitting: Physical Medicine and Rehabilitation

## 2019-05-19 MED FILL — ACCU-CHEK AVIVA PLUS STRP: 33 days supply | Qty: 100 | Fill #8

## 2019-05-19 MED FILL — ACYCLOVIR 400 MG TABLET: 400 | 30 days supply | Qty: 60 | Fill #3

## 2019-05-19 MED FILL — NOVOLOG MIX 70-30 FLEXPEN S: (70-30) 100 | 85 days supply | Qty: 12 | Fill #3

## 2019-05-23 MED FILL — DULoxetine HCL 30 MG CPEP: 30 | 30 days supply | Qty: 60 | Fill #2

## 2019-05-23 MED FILL — DONEPEZIL HCL 5 MG TABLET: 5 | 30 days supply | Qty: 30 | Fill #2

## 2019-05-26 ENCOUNTER — Encounter: Payer: Medicare HMO | Admitting: Physical Medicine and Rehabilitation

## 2019-06-02 ENCOUNTER — Other Ambulatory Visit: Payer: Self-pay | Admitting: Internal Medicine

## 2019-06-03 DIAGNOSIS — Z8546 Personal history of malignant neoplasm of prostate: Secondary | ICD-10-CM | POA: Diagnosis not present

## 2019-06-03 MED FILL — GABAPENTIN 300 MG CAPSULE: 300 | 30 days supply | Qty: 120 | Fill #0

## 2019-06-10 DIAGNOSIS — Z8546 Personal history of malignant neoplasm of prostate: Secondary | ICD-10-CM | POA: Diagnosis not present

## 2019-06-10 DIAGNOSIS — N5201 Erectile dysfunction due to arterial insufficiency: Secondary | ICD-10-CM | POA: Diagnosis not present

## 2019-06-13 ENCOUNTER — Other Ambulatory Visit: Payer: Self-pay

## 2019-06-13 ENCOUNTER — Encounter: Payer: Medicare HMO | Attending: Physical Medicine & Rehabilitation | Admitting: Physical Medicine and Rehabilitation

## 2019-06-13 ENCOUNTER — Encounter: Payer: Self-pay | Admitting: Physical Medicine and Rehabilitation

## 2019-06-13 DIAGNOSIS — F0280 Dementia in other diseases classified elsewhere without behavioral disturbance: Secondary | ICD-10-CM | POA: Diagnosis not present

## 2019-06-13 DIAGNOSIS — Z5181 Encounter for therapeutic drug level monitoring: Secondary | ICD-10-CM | POA: Diagnosis not present

## 2019-06-13 DIAGNOSIS — M545 Low back pain, unspecified: Secondary | ICD-10-CM

## 2019-06-13 DIAGNOSIS — M7918 Myalgia, other site: Secondary | ICD-10-CM | POA: Diagnosis not present

## 2019-06-13 DIAGNOSIS — M48061 Spinal stenosis, lumbar region without neurogenic claudication: Secondary | ICD-10-CM | POA: Insufficient documentation

## 2019-06-13 DIAGNOSIS — G8929 Other chronic pain: Secondary | ICD-10-CM | POA: Diagnosis not present

## 2019-06-13 DIAGNOSIS — M170 Bilateral primary osteoarthritis of knee: Secondary | ICD-10-CM | POA: Diagnosis not present

## 2019-06-13 MED ORDER — DICLOFENAC SODIUM 1 % EX GEL: 4.0000 g | Freq: Four times a day (QID) | CUTANEOUS | 5 refills | Status: DC

## 2019-06-13 MED ORDER — LIDOCAINE 5 % EX PTCH: 2.0000 | MEDICATED_PATCH | Freq: Two times a day (BID) | CUTANEOUS | 5 refills | Status: DC

## 2019-06-13 MED ORDER — METAXALONE 800 MG PO TABS: 800.0000 mg | ORAL_TABLET | Freq: Every evening | ORAL | 3 refills | Status: DC | PRN

## 2019-06-13 MED FILL — DICLOFENAC SODIUM 1% GEL: 1 | 6 days supply | Qty: 100 | Fill #0

## 2019-06-13 MED FILL — METAXALONE 800 MG TABLET: 800 | 30 days supply | Qty: 30 | Fill #0

## 2019-06-13 NOTE — Patient Instructions (Addendum)
Pt is a 73 yr old R handed male with hx of multiple myeloma- in remission x 4 months, DM with neuropathy, and here for f/u. 1. Stop Flexeril/cyclobenzaprine- the muscle relaxant- since not real helpful. Will try Metaxalone/Skelaxin 800 mg nightly as needed for muscle tightness/back fatigue.Will see if community health and wellness will get for him. Don't pay out of pocket.  2. Voltaren gel 4Grams 4x per day as needed on back and knees for pain. Can use for back or knees, but don't use on back at same time as lidocaine patches.   3. Needs refill of Lidocaine patches- have helped somewhat- will refill- 2 patches 12 hrs on;12 hrs off.   4. Has refills at [facility] for Amlodipine, his BP medicine  5. Explained to patient that his Aricept, the memory medicine is to SLOW progression of memory issues, doesn't stop or reverse the current memory issues. Continue daily.   6. Continue using tennis balls as needed for back tightness  7. Xray of lumbar spine- with hx of multiple myeloma important to check to make sure not out of remission.   8. F/U in 6 weeks  I spent a total of 25 minutes on appointment- more than 15 minutes educating pt on pain and how partial treatment is still good, not looking for complete resolution of pain and over plan as detailed above.

## 2019-06-13 NOTE — Progress Notes (Signed)
Subjective:    Patient ID: Robert Sparks, male    DOB: 11/15/1945, 73 y.o.   MRN: [id number]  HPI    Pt is a 73 yr old R handed male with hx of multiple myeloma- in remission x 4 months, DM with neuropathy, and here for f/u.  Only hurts when he walks.  Doing tennis balls- even lays down on them at night sometimes- 2x/day during the week- helps a little bit right then.  But wears off by the time he walks again.  Increased duloxetine- didn't really tell a difference.  Flexeril - is taking at night- Was $41 for 30 pills- is taking at night- makes him have bad dreams- wasn't really helpful.  Asking if has any cream that night help. Has used voltaren gel in past- doesn't remember what he used it for.  Used lidocaine patches- somewhat helpful. Also needs Norvasc to be refilled, however has refills left.  Started Aricept- still is forgetful, but explained ot pt it's to stop him from becoming a lot more forgetful-   Pain Inventory Average Pain 3 Pain Right Now 3 My pain is other  In the last 24 hours, has pain interfered with the following? General activity 1 Relation with others 0 Enjoyment of life 8 What TIME of day is your pain at its worst? vary Sleep (in general) Fair  Pain is worse with: walking Pain improves with: na Relief from Meds: na  Mobility walk without assistance ability to climb steps?  yes do you drive?  yes  Function retired  Neuro/Psych bowel control problems dizziness  Prior Studies Any changes since last visit?  no  Physicians involved in your care Any changes since last visit?  no   Family History  Problem Relation Age of Onset  . Kidney disease Mother   . Cancer Neg Hx   . Colon cancer Neg Hx   . Colon polyps Neg Hx   . Esophageal cancer Neg Hx   . Rectal cancer Neg Hx   . Stomach cancer Neg Hx    Social History   Socioeconomic History  . Marital status: Single    Spouse name: Not on file  . Number of children: Not on  file  . Years of education: Not on file  . Highest education level: Not on file  Occupational History  . Not on file  Social Needs  . Financial resource strain: Not on file  . Food insecurity    Worry: Not on file    Inability: Not on file  . Transportation needs    Medical: Not on file    Non-medical: Not on file  Tobacco Use  . Smoking status: Current Every Day Smoker    Packs/day: 0.50    Years: 50.00    Pack years: 25.00    Types: Cigarettes  . Smokeless tobacco: Never Used  . Tobacco comment: 1 ppwk-4-5 a day   Substance and Sexual Activity  . Alcohol use: Yes    Alcohol/week: 1.0 standard drinks    Types: 1 Cans of beer per week    Comment: 1 every day-quit couple weeks ago  . Drug use: No  . Sexual activity: Not Currently  Lifestyle  . Physical activity    Days per week: Not on file    Minutes per session: Not on file  . Stress: Not on file  Relationships  . Social Herbalist on phone: Not on file    Gets together: Not  on file    Attends religious service: Not on file    Active member of club or organization: Not on file    Attends meetings of clubs or organizations: Not on file    Relationship status: Not on file  Other Topics Concern  . Not on file  Social History Narrative  . Not on file   Past Surgical History:  Procedure Laterality Date  . CIRCUMCISION  1990's  . PROSTATE BIOPSY N/A 12/21/2015   Procedure: BIOPSY TRANSRECTAL ULTRASONIC PROSTATE (TUBP);  Surgeon: Festus Aloe, MD;  Location: [facility];  Service: Urology;  Laterality: N/A;   Past Medical History:  Diagnosis Date  . Anemia   . Arthritis    shoulders,feet   . Cataract    per eye dr -has appt 5-11  . CKD (chronic kidney disease), stage III   . Elevated PSA   . History of ketoacidosis    03-29-2015   . History of sepsis    07-25-2013  non-compliant w/ medication  . Hyperlipidemia   . Hypertension   . Nocturia   . Peripheral neuropathy   .  Prostate cancer ([facility])   . Type 2 diabetes mellitus with insulin therapy ([facility])    Temp 97.9 F (36.6 C)   Ht 5' 5.5" (1.664 m)   Wt 164 lb 6.4 oz (74.6 kg)   BMI 26.94 kg/m   Opioid Risk Score:   Fall Risk Score:  `1  Depression screen PHQ 2/9  Depression screen [facility] 2/9 04/25/2019 08/27/2018 06/20/2018 03/21/2018 01/07/2018 12/06/2017 09/04/2017  Decreased Interest 0 0 0 1 0 1 0  Down, Depressed, Hopeless 0 0 0 0 0 0 0  PHQ - 2 Score 0 0 0 1 0 1 0  Altered sleeping - 0 - - - - 0  Tired, decreased energy - 0 - - - - 0  Change in appetite - 0 - - - - 0  Feeling bad or failure about yourself  - 0 - - - - 0  Trouble concentrating - - - - - - 0  Moving slowly or fidgety/restless - 0 - - - - 0  Suicidal thoughts - 0 - - - - 0  PHQ-9 Score - 0 - - - - 0  Difficult doing work/chores - Not difficult at all - - - - -  Some recent data might be hidden     Review of Systems  Constitutional: Positive for diaphoresis.  Gastrointestinal: Positive for constipation.  Neurological: Positive for dizziness.  All other systems reviewed and are negative.      Objective:   Physical Exam  Awake, alert, appropriate, sitting on table, appears comfortable, NAD Still tight in band across low back-paraspinals  MRI lumbar spine 01/06/2019   IMPRESSION: 1. Mild disc bulge with superimposed right-sided 7 mm synovial cyst at L2-3 with resultant mild to moderate right lateral recess stenosis. 2. Disc bulging with moderate facet hypertrophy at L4-5 with resultant mild to moderate bilateral L4 foraminal stenosis. 3. Additional mild noncompressive disc bulging elsewhere within the lumbar spine without significant stenosis or neural impingement. 4. Moderate left worse than right facet hypertrophy at L4-5 with associated reactive marrow edema. Finding could contribute to lower back pain. Assessment & Plan:   Pt is a 73 yr old R handed male with hx of multiple myeloma- in remission x 4 months, DM with  neuropathy, and here for f/u. 1. Stop Flexeril/cyclobenzaprine- the muscle relaxant- since not real helpful. Will try Metaxalone/Skelaxin 800  mg nightly as needed for muscle tightness/back fatigue.Will see if community health and wellness will get for him. Don't pay out of pocket.  2. Voltaren gel 4Grams 4x per day as needed on back and knees for pain. Can use for back or knees, but don't use on back at same time as lidocaine patches.   3. Needs refill of Lidocaine patches- have helped somewhat- will refill- 2 patches 12 hrs on;12 hrs off.   4. Has refills at [place] for Amlodipine, his BP medicine  5. Explained to patient that his Aricept, the memory medicine is to SLOW progression of memory issues, doesn't stop or reverse the current memory issues. Continue daily.   6. Continue using tennis balls as needed for back tightness  7. Xray of lumbar spine- with hx of multiple myeloma important to check to make sure not out of remission.   8. F/U in 6 weeks   I spent a total of 25 minutes on appointment- more than 15 minutes educating pt on pain and how partial treatment is still good, not looking for complete resolution of pain and over plan as detailed above.

## 2019-06-16 NOTE — Progress Notes (Signed)
HEMATOLOGY/ONCOLOGY CLINIC NOTE  Date of Service: 06/17/2019  Patient Care Team: [name], MD as PCP - General (Internal Medicine) [name], MD (Inactive) as Consulting Physician (Hematology and Oncology)  CHIEF COMPLAINTS/PURPOSE OF CONSULTATION:  F/u for continued mx of Multiple Myeloma  Oncologic History:  73 y.o. male with diagnosis of IgA lambda active multiple myeloma, ISS Stage I. Active disease diagnosed based on presence of anemia, kidney insufficiency, and paraproteinemia with significant predominance of lambda light chains as well as significant elevation of IgA. Bone marrow biopsy confirmed presence of atypical monoclonal plasma cell process in the bone marrow comprising at least 7% of the cellularity. Based on the findings, patient was started on treatment with lenalidomide, bortezomib, and low-dose dexamethasone based on the anticipated tolerance by the patient. Lenalidomide was started at 10 mg daily based on creatinine clearance of 42, dexamethasone dose was reduced to 20 mg weekly based on patient's age. Treatment was complicated by rapid development of cutaneous rash attributed to lenalidomide, inaddition to decreased renal function and now patient has persistent severe anemia. Side-effects were attributed to Lenalidomide and lenalidomide was discontinued with subsequent recovery.  Patient has been receiving bortezomib weekly with low-dose dexamethasone in 4-day cycles.  Patient has completed induction systemic therapy for his disease with repeat bone marrow biopsy confirming complete response including no evidence of minimal residual disease by cytogenetics or FISH.  HISTORY OF PRESENTING ILLNESS:   Robert Sparks is a wonderful 73 y.o. male who has been referred to [place] by my colleague Dr Grace Isaac for evaluation and management of Multiple Myeloma. The pt reports that he is doing well overall.   The pt has been previously followed by my colleague  Dr Grace Isaac. The pt was treated with Revlimid, Velcade, and dexamethasone and was subsequently switched to Velcade and Dexamethasone after a Revlimid intolerance.   The pt reports some knee aches and skin soreness in his legs which is worse at night. He notes that he has had diabetic-related peripheral neuropathy. The pt has not seen [facility] yet for a discussion of a BM transplant. He continues taking Acyclovir and notes no difficulty taking maintenance Velcade.   He adds that he takes Gabapentin and sweats.  He notes that his neuropathy is currently stable and denies it worsening.   He notes that a pack of cigarettes lasts him for about 3 days.   Most recent lab results (12/14/17) of CBC and CMP  is as follows: all values are WNL except for RBC at 3.08, HGB at 10.1, HCT at 31.0, MCV at 100.6, Eosinophils Abs at 1.4k, Potassium at 3.4, Glucose at 156, Creatinine at 1.60. LDH 12/14/17 is 141 Beta 2 microglobulin 12/14/17 is 2.4  On review of systems, pt reports knee pain, night time LE skin soreness, peripheral neuropathy, eating well, strong appetite, intermittent sweating and denies fevers, chills, back pains, abdominal pains, and any other symptoms.   [name] DM type 2, HTN, CKD. On Social Hx the pt reports smoking a pack of cigarettes every 3 days.   Interval History:   [name] returns today regarding management and evaluation of his Multiple Myeloma. [name] was on 04/15/2019. The pt reports that he is doing well overall.  The pt reports he has been feeling good and has been eating well. Pt no longer has numbness/tingling in his hands or feet. He does have some dryness in between his toes. Pt has  been taking safety precautions in light of the pandemic but will be traveling to his hometown for Christmas.   Lab results today (06/17/19) of CBC w/diff and CMP is as follows: all values are WNL except for RBC at 3.10, Hgb at 10.7, HCT at  31.6, MCV at 101.9, MCH at 34.5, Eos Abs at 1.3K, Glucose at 128, Creatinine at 1.57, GFR Est Afr Am at 51. 06/17/2019 MMP is in no M spike  On review of systems, pt reports dryness between his toes and denies neuropathy in hands/feet, abdominal pain, fevers, chills, night sweats, new bone pains, leg swelling and any other symptoms.   MEDICAL HISTORY:  Past Medical History:  Diagnosis Date  . Anemia   . Arthritis    shoulders,feet   . Cataract    per eye dr -has appt 5-11  . CKD (chronic kidney disease), stage III   . Elevated PSA   . History of ketoacidosis    03-29-2015   . History of sepsis    07-25-2013  non-compliant w/ medication  . Hyperlipidemia   . Hypertension   . Nocturia   . Peripheral neuropathy   . Prostate cancer ([place])   . Type 2 diabetes mellitus with insulin therapy ([place])     SURGICAL HISTORY: Past Surgical History:  Procedure Laterality Date  . CIRCUMCISION  1990's  . PROSTATE BIOPSY N/A 12/21/2015   Procedure: BIOPSY TRANSRECTAL ULTRASONIC PROSTATE (TUBP);  Surgeon: Festus Aloe, MD;  Location: [facility];  Service: Urology;  Laterality: N/A;    SOCIAL HISTORY: Social History   Socioeconomic History  . Marital status: Single    Spouse name: Not on file  . Number of children: Not on file  . Years of education: Not on file  . Highest education level: Not on file  Occupational History  . Not on file  Social Needs  . Financial resource strain: Not on file  . Food insecurity    Worry: Not on file    Inability: Not on file  . Transportation needs    Medical: Not on file    Non-medical: Not on file  Tobacco Use  . Smoking status: Current Every Day Smoker    Packs/day: 0.50    Years: 50.00    Pack years: 25.00    Types: Cigarettes  . Smokeless tobacco: Never Used  . Tobacco comment: 1 ppwk-4-5 a day   Substance and Sexual Activity  . Alcohol use: Yes    Alcohol/week: 1.0 standard drinks    Types: 1 Cans of beer per week     Comment: 1 every day-quit couple weeks ago  . Drug use: No  . Sexual activity: Not Currently  Lifestyle  . Physical activity    Days per week: Not on file    Minutes per session: Not on file  . Stress: Not on file  Relationships  . Social Herbalist on phone: Not on file    Gets together: Not on file    Attends religious service: Not on file    Active member of club or organization: Not on file    Attends meetings of clubs or organizations: Not on file    Relationship status: Not on file  . Intimate partner violence    Fear of current or ex partner: Not on file    Emotionally abused: Not on file    Physically abused: Not on file    Forced sexual activity: Not on file  Other Topics  Concern  . Not on file  Social History Narrative  . Not on file    FAMILY HISTORY: Family History  Problem Relation Age of Onset  . Kidney disease Mother   . Cancer Neg Hx   . Colon cancer Neg Hx   . Colon polyps Neg Hx   . Esophageal cancer Neg Hx   . Rectal cancer Neg Hx   . Stomach cancer Neg Hx     ALLERGIES:  has No Known Allergies.  MEDICATIONS:  Current Outpatient Medications  Medication Sig Dispense Refill  . ACCU-CHEK AVIVA PLUS test strip 1 each by Other route See admin instructions. 100 each 12  . acetaminophen-codeine (TYLENOL #3) 300-30 MG tablet TAKE ONE TABLET BY MOUTH EVERY 8 HOURS AS NEEDED FOR MODERATE PAIN 60 tablet 0  . acyclovir (ZOVIRAX) 400 MG tablet Take 1 tablet (400 mg total) by mouth 2 (two) times daily. 60 tablet 5  . amLODipine (NORVASC) 10 MG tablet Take 1 tablet (10 mg total) by mouth daily. 30 tablet 6  . aspirin EC 81 MG tablet Take 1 tablet (81 mg total) by mouth daily. 90 tablet 3  . b complex vitamins capsule Take 1 capsule by mouth daily. 30 capsule 5  . Cholecalciferol (VITAMIN D) 2000 units CAPS Take 2,000 Units by mouth daily.    . cyclobenzaprine (FLEXERIL) 10 MG tablet Take 1 tablet (10 mg total) by mouth at bedtime. To relax muscles of  low back 30 tablet 5  . diclofenac sodium (VOLTAREN) 1 % GEL Apply 2 g topically 4 (four) times daily. 100 g 2  . diclofenac Sodium (VOLTAREN) 1 % GEL Apply 4 g topically 4 (four) times daily. 150 g 5  . donepezil (ARICEPT) 5 MG tablet Take 1 tablet (5 mg total) by mouth at bedtime. For memory issues 30 tablet 2  . DULoxetine (CYMBALTA) 30 MG capsule Take 1 capsule (30 mg total) by mouth daily. X 1 week then 60 mg daily- for nerve pain/back pain 60 capsule 5  . ferrous sulfate 325 (65 FE) MG tablet Take 325 mg by mouth daily.    [name] gabapentin (NEURONTIN) 300 MG capsule TAKE 1 CAPSULE BY MOUTH IN THE MORNING AND LUNCH, AND 2 AT BEDTIME 120 capsule 6  . insulin aspart protamine - aspart (NOVOLOG 70/30 MIX) (70-30) 100 UNIT/ML FlexPen Inject 0.07 mLs (7 Units total) into the skin 2 (two) times daily with a meal. 15 mL 6  . lidocaine (LIDODERM) 5 % Place 2 patches onto the skin every 12 (twelve) hours. place on each side of spine on low back- put on for 12 hrs; take off for 12 hours. 60 patch 5  . metaxalone (SKELAXIN) 800 MG tablet Take 1 tablet (800 mg total) by mouth at bedtime as needed for muscle spasms. 30 tablet 3  . Multiple Vitamin (MULTIVITAMIN WITH MINERALS) TABS tablet Take 1 tablet by mouth daily. 60 tablet 2  . nicotine (NICODERM CQ - DOSED IN MG/24 HOURS) 14 mg/24hr patch Place 1 patch (14 mg total) onto the skin daily. 28 patch 1  . nicotine polacrilex (RA NICOTINE GUM) 2 MG gum Take 1 each (2 mg total) by mouth as needed for smoking cessation (limit to no more than 30 pieces/24 hr). 100 tablet 3  . NON FORMULARY [place] [facility]   CREAMS- #11    . pravastatin (PRAVACHOL) 20 MG tablet TAKE 1 TABLET BY MOUTH DAILY. 90 tablet 0  . tadalafil (ADCIRCA/CIALIS) 20 MG tablet Take 20 mg by  mouth daily as needed for erectile dysfunction.   0  . triamcinolone cream (KENALOG) 0.1 % Apply 1 application topically 3 (three) times daily as needed. 60 g 2  . TRUEPLUS PEN NEEDLES 32G X 4 MM MISC USE  AS DIRECTED TWICE DAILY WITH INSULIN 100 each 0  . vitamin B-12 (CYANOCOBALAMIN) 100 MCG tablet Take 100 mcg by mouth daily.    . Miconazole Nitrate 2 % AERP Apply 1 application topically 2 (two) times daily. Apply to fungal rash between on feet 85 g 0   No current facility-administered medications for this visit.     REVIEW OF SYSTEMS:   A 10+ POINT REVIEW OF SYSTEMS WAS OBTAINED including neurology, dermatology, psychiatry, cardiac, respiratory, lymph, extremities, GI, GU, Musculoskeletal, constitutional, breasts, reproductive, HEENT.  All pertinent positives are noted in the HPI.  All others are negative.   PHYSICAL EXAMINATION: ECOG PERFORMANCE STATUS: 1 - Symptomatic but completely ambulatory Exam was given in a chair  .BP 135/79 (BP Location: Left Arm, Patient Position: Sitting)   Pulse 84   Temp (!) 97.4 F (36.3 C) (Temporal)   Resp 18   Ht 5' 5.5" (1.664 m)   Wt 164 lb 6.4 oz (74.6 kg)   SpO2 98%   BMI 26.94 kg/m    GENERAL:alert, in no acute distress and comfortable SKIN: no acute rashes, no significant lesions EYES: conjunctiva are pink and non-injected, sclera anicteric OROPHARYNX: MMM, no exudates, no oropharyngeal erythema or ulceration NECK: supple, no JVD LYMPH:  no palpable lymphadenopathy in the cervical, axillary or inguinal regions LUNGS: clear to auscultation b/l with normal respiratory effort HEART: regular rate & rhythm ABDOMEN:  normoactive bowel sounds , non tender, not distended. No palpable hepatosplenomegaly.  Extremity: no pedal edema PSYCH: alert & oriented x 3 with fluent speech NEURO: no focal motor/sensory deficits  LABORATORY DATA:  I have reviewed the data as listed  . CBC Latest Ref Rng & Units 06/17/2019 04/15/2019 01/13/2019  WBC 4.0 - 10.5 K/uL 6.5 5.9 6.9  Hemoglobin 13.0 - 17.0 g/dL 10.7(L) 10.8(L) 11.0(L)  Hematocrit 39.0 - 52.0 % 31.6(L) 33.1(L) 32.6(L)  Platelets 150 - 400 K/uL 192 207 208    . CMP Latest Ref Rng & Units  06/17/2019 04/15/2019 01/13/2019  Glucose 70 - 99 mg/dL 128(H) 134(H) 104(H)  BUN 8 - 23 mg/dL 21 24(H) 23  Creatinine 0.61 - 1.24 mg/dL 1.57(H) 1.49(H) 1.45(H)  Sodium 135 - 145 mmol/L 138 139 141  Potassium 3.5 - 5.1 mmol/L 3.9 4.0 3.9  Chloride 98 - 111 mmol/L 105 105 108  CO2 22 - 32 mmol/L '23 26 24  ' Calcium 8.9 - 10.3 mg/dL 9.2 9.4 9.6  Total Protein 6.5 - 8.1 g/dL 7.1 7.0 7.2  Total Bilirubin 0.3 - 1.2 mg/dL 0.5 0.5 0.6  Alkaline Phos 38 - 126 U/L 90 99 57  AST 15 - 41 U/L 40 30 23  ALT 0 - 44 U/L 40 33 29   08/29/17 BM Bx:    08/29/17 Cytogenetics:   03/13/17 Cytogenetics:          RADIOGRAPHIC STUDIES: I have personally reviewed the radiological images as listed and agreed with the findings in the report. No results found.  ASSESSMENT & PLAN:   73 y.o. male with   1. IgA Lambda Multiple Myeloma ISS Stage I -Currently in remission  Active disease was previously diagnosed based on presence of anemia, kidney insufficiency, and paraproteinemia with significant predominance of lambda light chains as well as significant  elevation of IgA.  PLAN:  -Discussed pt labwork today, 06/17/19; mild anemia, other blood counts are stable, blood chemistries are stable -Discussed 06/17/2019 MMP is in progress  -Discussed 04/15/2019 MMP shows no M Protein  -No lab or clinical indication of the return of MM  -Will restart Ninlaro for maintenance as per patients preference -Recommended pt keep his feet dry  -Continue Gabapentin and Cymbalta per PCP -Continue Acyclovir  -Rx miconazole anti fungal powder -Will see back in 2 months with labs   FOLLOW UP: RTC with Dr Irene Limbo with labs in 2 months  The total time spent in the appt was 15 minutes and more than 50% was on counseling and direct patient cares.  All of the patient's questions were answered with apparent satisfaction. The patient knows to call the clinic with any problems, questions or concerns.  Sullivan Lone MD [place] AAHIVMS  [facility] Hematology/Oncology Physician [facility]  (Office):       [phone] (Work cell):  [phone] (Fax):           [phone]  06/17/2019 4:05 PM  I, Yevette Edwards, am acting as a scribe for Dr. Sullivan Lone.   .I have reviewed the above documentation for accuracy and completeness, and I agree with the above. Brunetta Genera MD

## 2019-06-17 ENCOUNTER — Inpatient Hospital Stay: Payer: Medicare HMO | Attending: Hematology

## 2019-06-17 ENCOUNTER — Other Ambulatory Visit: Payer: Self-pay

## 2019-06-17 ENCOUNTER — Inpatient Hospital Stay (HOSPITAL_BASED_OUTPATIENT_CLINIC_OR_DEPARTMENT_OTHER): Payer: Medicare HMO | Admitting: Hematology

## 2019-06-17 VITALS — BP 135/79 | HR 84 | Temp 97.4°F | Resp 18 | Ht 65.5 in | Wt 164.4 lb

## 2019-06-17 DIAGNOSIS — I10 Essential (primary) hypertension: Secondary | ICD-10-CM | POA: Diagnosis not present

## 2019-06-17 DIAGNOSIS — C9001 Multiple myeloma in remission: Secondary | ICD-10-CM

## 2019-06-17 DIAGNOSIS — G629 Polyneuropathy, unspecified: Secondary | ICD-10-CM | POA: Diagnosis not present

## 2019-06-17 DIAGNOSIS — Z79899 Other long term (current) drug therapy: Secondary | ICD-10-CM | POA: Diagnosis not present

## 2019-06-17 DIAGNOSIS — F17210 Nicotine dependence, cigarettes, uncomplicated: Secondary | ICD-10-CM | POA: Diagnosis not present

## 2019-06-17 DIAGNOSIS — B372 Candidiasis of skin and nail: Secondary | ICD-10-CM | POA: Diagnosis not present

## 2019-06-17 DIAGNOSIS — Z7982 Long term (current) use of aspirin: Secondary | ICD-10-CM | POA: Insufficient documentation

## 2019-06-17 DIAGNOSIS — N183 Chronic kidney disease, stage 3 unspecified: Secondary | ICD-10-CM | POA: Diagnosis not present

## 2019-06-17 DIAGNOSIS — E119 Type 2 diabetes mellitus without complications: Secondary | ICD-10-CM | POA: Diagnosis not present

## 2019-06-17 LAB — CBC WITH DIFFERENTIAL/PLATELET
Abs Immature Granulocytes: 0.01 10*3/uL (ref 0.00–0.07)
Basophils Absolute: 0.1 10*3/uL (ref 0.0–0.1)
Basophils Relative: 1 %
Eosinophils Absolute: 1.3 10*3/uL — ABNORMAL HIGH (ref 0.0–0.5)
Eosinophils Relative: 20 %
HCT: 31.6 % — ABNORMAL LOW (ref 39.0–52.0)
Hemoglobin: 10.7 g/dL — ABNORMAL LOW (ref 13.0–17.0)
Immature Granulocytes: 0 %
Lymphocytes Relative: 27 %
Lymphs Abs: 1.8 10*3/uL (ref 0.7–4.0)
MCH: 34.5 pg — ABNORMAL HIGH (ref 26.0–34.0)
MCHC: 33.9 g/dL (ref 30.0–36.0)
MCV: 101.9 fL — ABNORMAL HIGH (ref 80.0–100.0)
Monocytes Absolute: 0.4 10*3/uL (ref 0.1–1.0)
Monocytes Relative: 6 %
Neutro Abs: 3 10*3/uL (ref 1.7–7.7)
Neutrophils Relative %: 46 %
Platelets: 192 10*3/uL (ref 150–400)
RBC: 3.1 MIL/uL — ABNORMAL LOW (ref 4.22–5.81)
RDW: 13.3 % (ref 11.5–15.5)
WBC: 6.5 10*3/uL (ref 4.0–10.5)
nRBC: 0 % (ref 0.0–0.2)

## 2019-06-17 LAB — CMP (CANCER CENTER ONLY)
ALT: 40 U/L (ref 0–44)
AST: 40 U/L (ref 15–41)
Albumin: 4.1 g/dL (ref 3.5–5.0)
Alkaline Phosphatase: 90 U/L (ref 38–126)
Anion gap: 10 (ref 5–15)
BUN: 21 mg/dL (ref 8–23)
CO2: 23 mmol/L (ref 22–32)
Calcium: 9.2 mg/dL (ref 8.9–10.3)
Chloride: 105 mmol/L (ref 98–111)
Creatinine: 1.57 mg/dL — ABNORMAL HIGH (ref 0.61–1.24)
GFR, Est AFR Am: 51 mL/min — ABNORMAL LOW (ref >60)
GFR, Estimated: 44 mL/min — ABNORMAL LOW (ref >60)
Glucose, Bld: 128 mg/dL — ABNORMAL HIGH (ref 70–99)
Potassium: 3.9 mmol/L (ref 3.5–5.1)
Sodium: 138 mmol/L (ref 135–145)
Total Bilirubin: 0.5 mg/dL (ref 0.3–1.2)
Total Protein: 7.1 g/dL (ref 6.5–8.1)

## 2019-06-17 MED ORDER — MICONAZOLE NITRATE 2 % EX AERP: 1.0000 "application " | INHALATION_SPRAY | Freq: Two times a day (BID) | CUTANEOUS | 0 refills | Status: DC

## 2019-06-17 MED FILL — AMLODIPINE BESYLATE 10 MG T: 10 | 30 days supply | Qty: 30 | Fill #1

## 2019-06-18 ENCOUNTER — Emergency Department (HOSPITAL_COMMUNITY): Admission: EM | Admit: 2019-06-18 | Payer: Medicare HMO | Source: Home / Self Care

## 2019-06-18 ENCOUNTER — Other Ambulatory Visit: Payer: Self-pay

## 2019-06-18 ENCOUNTER — Ambulatory Visit (HOSPITAL_COMMUNITY)
Admission: RE | Admit: 2019-06-18 | Discharge: 2019-06-18 | Disposition: A | Discharge status: Home / Self Care | Payer: Medicare HMO | Source: Ambulatory Visit | Attending: Physical Medicine and Rehabilitation | Admitting: Physical Medicine and Rehabilitation

## 2019-06-18 ENCOUNTER — Encounter (HOSPITAL_COMMUNITY): Payer: Self-pay | Admitting: Emergency Medicine

## 2019-06-18 ENCOUNTER — Telehealth: Payer: Self-pay | Admitting: Hematology

## 2019-06-18 DIAGNOSIS — M7918 Myalgia, other site: Secondary | ICD-10-CM | POA: Insufficient documentation

## 2019-06-18 DIAGNOSIS — M48061 Spinal stenosis, lumbar region without neurogenic claudication: Secondary | ICD-10-CM | POA: Diagnosis not present

## 2019-06-18 DIAGNOSIS — M545 Low back pain, unspecified: Secondary | ICD-10-CM

## 2019-06-18 DIAGNOSIS — G8929 Other chronic pain: Secondary | ICD-10-CM | POA: Insufficient documentation

## 2019-06-18 DIAGNOSIS — M170 Bilateral primary osteoarthritis of knee: Secondary | ICD-10-CM | POA: Insufficient documentation

## 2019-06-18 NOTE — ED Triage Notes (Addendum)
C/o lower back pain x 1 year.  States it is worse when he walks.  States rehab sent him here.

## 2019-06-18 NOTE — Telephone Encounter (Signed)
Pt. Left before prescription was given. Prescription for miconazole nitrate 2% was called in to [facility].

## 2019-06-18 NOTE — Telephone Encounter (Signed)
Scheduled  Per los. Called and left msg. Mailed printout

## 2019-06-19 LAB — MULTIPLE MYELOMA PANEL, SERUM
Albumin SerPl Elph-Mcnc: 4 g/dL (ref 2.9–4.4)
Albumin/Glob SerPl: 1.6 (ref 0.7–1.7)
Alpha 1: 0.2 g/dL (ref 0.0–0.4)
Alpha2 Glob SerPl Elph-Mcnc: 0.7 g/dL (ref 0.4–1.0)
B-Globulin SerPl Elph-Mcnc: 0.8 g/dL (ref 0.7–1.3)
Gamma Glob SerPl Elph-Mcnc: 1 g/dL (ref 0.4–1.8)
Globulin, Total: 2.6 g/dL (ref 2.2–3.9)
IgA: 167 mg/dL (ref 61–437)
IgG (Immunoglobin G), Serum: 1010 mg/dL (ref 603–1613)
IgM (Immunoglobulin M), Srm: 96 mg/dL (ref 15–143)
Total Protein ELP: 6.6 g/dL (ref 6.0–8.5)

## 2019-06-23 MED ORDER — IXAZOMIB CITRATE 3 MG PO CAPS: ORAL_CAPSULE | ORAL | 3 refills | Status: DC

## 2019-06-24 ENCOUNTER — Telehealth: Payer: Self-pay

## 2019-06-24 ENCOUNTER — Telehealth: Payer: Self-pay | Admitting: Pharmacist

## 2019-06-24 DIAGNOSIS — C9001 Multiple myeloma in remission: Secondary | ICD-10-CM

## 2019-06-24 MED ORDER — IXAZOMIB CITRATE 3 MG PO CAPS: ORAL_CAPSULE | ORAL | 3 refills | Status: DC

## 2019-06-24 NOTE — Telephone Encounter (Signed)
Oral Oncology Pharmacist Encounter  Received prescription for reinitiating Ninlaro (ixazomib) for the maintenance treatment of Multiple Myeloma, planned duration until disease progression or unacceptable toxicity.  Prescription for Ninlaro assessed. Dose and frequency are appropriate. Dose is reduced (3 mg), but same dose as previous prescriptions. Dose reduction is appropriate for patient given history of neuropathy. Will continue to monitor need for further dose reduction once patient resumes therapy.   CMP and CBC w/ diff from 06/17/19 assessed:  Pt with chronic renal dysfunction, Scr 1.57 (CrCl ~45.5 mL/min) - no dose adjustments necessary at this time  CBC w/ diff OK for treatment initiation  Current medication list in Epic reviewed, no DDIs with Ninlaro (ixazomib) identified.  Prescription has been e-scribed to the [facility] for benefits analysis and approval.  Oral Oncology Clinic will continue to follow for insurance authorization, copayment issues, initial counseling and start date.  Leron Croak, PharmD, [place] PGY2 Hematology/Oncology Pharmacy Resident 06/24/2019 1:34 PM Oral Oncology Clinic [phone]

## 2019-06-24 NOTE — Telephone Encounter (Signed)
Oral Oncology Patient Advocate Encounter  After completing a benefits investigation, prior authorization for Ninalro was already acquired   Patient's copay is $0.    [place] Patient [place] Phone [phone] Fax [phone] 06/24/2019 4:26 PM

## 2019-06-24 NOTE — Telephone Encounter (Signed)
Oral Oncology Pharmacist Encounter  Attempted to complete Prior Authorization for Ninlaro. Patient already has Prior Authorization on file.  Leron Croak, PharmD, BCPS PGY2 Hematology/Oncology Pharmacy Resident 06/24/2019 1:58 PM

## 2019-07-07 ENCOUNTER — Telehealth: Payer: Self-pay | Admitting: Pharmacist

## 2019-07-07 MED FILL — ACCU-CHEK AVIVA PLUS STRP: 33 days supply | Qty: 100 | Fill #9

## 2019-07-07 NOTE — Telephone Encounter (Signed)
Oral Chemotherapy Pharmacist Encounter  Medication will be shipped by [facility] for delivery 07/09/2019. [name] plans to start his medication on Thursday 07/10/2019.  Patient Education I spoke with patient for overview of reinitiated oral chemotherapy medication: Ninlaro (ixazomib) for the maintenance treatment of Multiple Myeloma, planned duration until disease progression or unacceptable toxicity.   Counseled patient on administration, dosing, side effects, monitoring, drug-food interactions, safe handling, storage, and disposal. Patient will take 1 capsule (3 mg) by mouth weekly, 3 weeks on, 1 week off, repeat every 4 weeks. Take on an empty stomach 1hr before or 2hr after meals.  Side effects include but not limited to: diarrhea or constipation, decreased wbc/plt.    Reviewed with patient importance of keeping a medication schedule and plan for any missed doses.  [name] voiced understanding and appreciation. All questions answered. Medication handout provided and consent obtained.  Provided patient with [facility] phone number. Patient knows to call the office with questions or concerns. Oral Chemotherapy Navigation Clinic will continue to follow.  Darl Pikes, PharmD, BCPS, [facility] Hematology/Oncology Clinical Pharmacist [facility] [phone]  07/07/2019 3:13 PM

## 2019-07-08 ENCOUNTER — Other Ambulatory Visit: Payer: Self-pay | Admitting: Internal Medicine

## 2019-07-08 MED FILL — METAXALONE 800 MG TABLET: 800 | 30 days supply | Qty: 30 | Fill #1

## 2019-07-08 MED FILL — TRUEPLUS PEN NDL 32GX5/32": 32G X 4 MM | 50 days supply | Qty: 100 | Fill #0

## 2019-07-08 MED FILL — NINLARO 3 MG CAPS: 3 | 28 days supply | Qty: 3 | Fill #0

## 2019-07-08 MED FILL — TRUEPLUS PEN NDL 32GX5/32: 32G X 4 MM | 50 days supply | Qty: 100 | Fill #0

## 2019-07-14 MED FILL — GABAPENTIN 300 MG CAPSULE: 300 | 30 days supply | Qty: 120 | Fill #1

## 2019-07-22 ENCOUNTER — Telehealth: Payer: Self-pay | Admitting: Internal Medicine

## 2019-07-22 ENCOUNTER — Other Ambulatory Visit: Payer: Self-pay | Admitting: Pharmacist

## 2019-07-22 DIAGNOSIS — E1122 Type 2 diabetes mellitus with diabetic chronic kidney disease: Secondary | ICD-10-CM

## 2019-07-22 DIAGNOSIS — Z794 Long term (current) use of insulin: Secondary | ICD-10-CM

## 2019-07-22 MED ORDER — INSULIN ASPART PROT & ASPART (70-30 MIX) 100 UNIT/ML PEN: 6.0000 [IU] | PEN_INJECTOR | Freq: Two times a day (BID) | SUBCUTANEOUS | 6 refills | Status: DC

## 2019-07-22 NOTE — Telephone Encounter (Signed)
Nurse called asking for a ne prescription to be sent to the pharmacy because the pt is take more than the 6 units a day. Please call Her back at [phone]

## 2019-07-22 NOTE — Telephone Encounter (Signed)
Asked Lurena Joiner to send a updated rx to the pharmacy. Rx was sent 07/22/19 to [facility]

## 2019-07-23 MED FILL — NOVOLOG MIX 70-30 FLEXPEN S: (70-30) 100 | 25 days supply | Qty: 3 | Fill #0

## 2019-07-24 ENCOUNTER — Other Ambulatory Visit: Payer: Self-pay | Admitting: Internal Medicine

## 2019-07-24 MED FILL — ACYCLOVIR 400 MG TABLET: 400 | 30 days supply | Qty: 60 | Fill #4

## 2019-07-24 MED FILL — PRAVASTATIN SODIUM 20 MG TA: 20 | 30 days supply | Qty: 30 | Fill #0

## 2019-07-25 ENCOUNTER — Other Ambulatory Visit: Payer: Self-pay

## 2019-07-25 ENCOUNTER — Encounter: Payer: Medicare HMO | Attending: Physical Medicine & Rehabilitation | Admitting: Physical Medicine and Rehabilitation

## 2019-07-25 ENCOUNTER — Encounter: Payer: Self-pay | Admitting: Physical Medicine and Rehabilitation

## 2019-07-25 ENCOUNTER — Telehealth: Payer: Self-pay

## 2019-07-25 ENCOUNTER — Other Ambulatory Visit: Payer: Self-pay | Admitting: Hematology

## 2019-07-25 VITALS — Ht 65.5 in | Wt 164.4 lb

## 2019-07-25 DIAGNOSIS — M48062 Spinal stenosis, lumbar region with neurogenic claudication: Secondary | ICD-10-CM | POA: Diagnosis not present

## 2019-07-25 DIAGNOSIS — G8929 Other chronic pain: Secondary | ICD-10-CM | POA: Insufficient documentation

## 2019-07-25 DIAGNOSIS — M170 Bilateral primary osteoarthritis of knee: Secondary | ICD-10-CM | POA: Insufficient documentation

## 2019-07-25 DIAGNOSIS — M545 Low back pain: Secondary | ICD-10-CM | POA: Insufficient documentation

## 2019-07-25 DIAGNOSIS — M7918 Myalgia, other site: Secondary | ICD-10-CM | POA: Diagnosis not present

## 2019-07-25 DIAGNOSIS — M48061 Spinal stenosis, lumbar region without neurogenic claudication: Secondary | ICD-10-CM | POA: Insufficient documentation

## 2019-07-25 DIAGNOSIS — Z5181 Encounter for therapeutic drug level monitoring: Secondary | ICD-10-CM | POA: Insufficient documentation

## 2019-07-25 DIAGNOSIS — F0280 Dementia in other diseases classified elsewhere without behavioral disturbance: Secondary | ICD-10-CM | POA: Insufficient documentation

## 2019-07-25 MED ORDER — HYDROXYZINE HCL 25 MG PO TABS: 25.0000 mg | ORAL_TABLET | Freq: Three times a day (TID) | ORAL | 0 refills | Status: DC | PRN

## 2019-07-25 MED ORDER — PREDNISONE 20 MG PO TABS: 40.0000 mg | ORAL_TABLET | Freq: Every day | ORAL | 0 refills | Status: AC

## 2019-07-25 MED FILL — predniSONE 20 MG TABS: 20 | 5 days supply | Qty: 10 | Fill #0

## 2019-07-25 MED FILL — hydrOXYzine HCL 25 MG TABS: 25 | 10 days supply | Qty: 30 | Fill #0

## 2019-07-25 NOTE — Telephone Encounter (Signed)
Patient called and stated he has a rash on his stomach, thighs, and arms. Patient states he has "a lot of itching". Patient states he takes Ninlaro weekly and last took it Tuesday 07/22/19. Dr. Irene Limbo made aware and per Dr. Irene Limbo patient instructed to stop Ninlaro and take Claritin 10 mg po daily and Pepcid 20 mg daily. Patient verbalized understanding of all instructions. Dr. Irene Limbo stated he will send in a prescription to patient's pharmacy for Hydroxyzine and Prednisone. Patient made aware and verbalized understanding. Patient aware to call the office next week if rash becomes worse. Verbalized understanding.

## 2019-07-25 NOTE — Progress Notes (Signed)
Subjective:    Patient ID: Robert Sparks, male    DOB: 06-Sep-1945, 74 y.o.   MRN: [id number]  HPI  Pt is a 74 yr old R handed male with hx of multiple myeloma- in remission x 4 months, DM with neuropathy, and here for f/u.   Webex appointment  Skelaxin is doing pretty good for him. Takes 1x/day- can't take at night. Can't walks, back doesn't bother him as bad.   Hasn't been using lidocaine patches because "hasn't need them".   Pretty good.   Has a rash- addressing with Cancer doctor. Got some pills prescribed.   Uses voltaren gel for knees- helpful.     Pain Inventory Average Pain 0 Pain Right Now 0 My pain is na  In the last 24 hours, has pain interfered with the following? General activity 0 Relation with others 0 Enjoyment of life 0 What TIME of day is your pain at its worst? na Sleep (in general) Good  Pain is worse with: na Pain improves with: na Relief from Meds: na  Mobility walk without assistance ability to climb steps?  yes do you drive?  yes  Function retired  Neuro/Psych numbness spasms  Prior Studies Any changes since last visit?  no  Physicians involved in your care Any changes since last visit?  no   Family History  Problem Relation Age of Onset  . Kidney disease Mother   . Cancer Neg Hx   . Colon cancer Neg Hx   . Colon polyps Neg Hx   . Esophageal cancer Neg Hx   . Rectal cancer Neg Hx   . Stomach cancer Neg Hx    Social History   Socioeconomic History  . Marital status: Single    Spouse name: Not on file  . Number of children: Not on file  . Years of education: Not on file  . Highest education level: Not on file  Occupational History  . Not on file  Tobacco Use  . Smoking status: Current Every Day Smoker    Packs/day: 0.50    Years: 50.00    Pack years: 25.00    Types: Cigarettes  . Smokeless tobacco: Never Used  . Tobacco comment: 1 ppwk-4-5 a day   Substance and Sexual Activity  . Alcohol use: Yes   Alcohol/week: 1.0 standard drinks    Types: 1 Cans of beer per week    Comment: 1 every day-quit couple weeks ago  . Drug use: No  . Sexual activity: Not Currently  Other Topics Concern  . Not on file  Social History Narrative  . Not on file   Social Determinants of Health   Financial Resource Strain:   . Difficulty of Paying Living Expenses: Not on file  Food Insecurity:   . Worried About Charity fundraiser in the Last Year: Not on file  . Ran Out of Food in the Last Year: Not on file  Transportation Needs:   . Lack of Transportation (Medical): Not on file  . Lack of Transportation (Non-Medical): Not on file  Physical Activity:   . Days of Exercise per Week: Not on file  . Minutes of Exercise per Session: Not on file  Stress:   . Feeling of Stress : Not on file  Social Connections:   . Frequency of Communication with Friends and Family: Not on file  . Frequency of Social Gatherings with Friends and Family: Not on file  . Attends Religious Services: Not on file  .  Active Member of Clubs or Organizations: Not on file  . Attends Archivist Meetings: Not on file  . Marital Status: Not on file   Past Surgical History:  Procedure Laterality Date  . CIRCUMCISION  1990's  . PROSTATE BIOPSY N/A 12/21/2015   Procedure: BIOPSY TRANSRECTAL ULTRASONIC PROSTATE (TUBP);  Surgeon: Festus Aloe, MD;  Location: [facility];  Service: Urology;  Laterality: N/A;   Past Medical History:  Diagnosis Date  . Anemia   . Arthritis    shoulders,feet   . Cataract    per eye dr -has appt 5-11  . CKD (chronic kidney disease), stage III   . Elevated PSA   . History of ketoacidosis    03-29-2015   . History of sepsis    07-25-2013  non-compliant w/ medication  . Hyperlipidemia   . Hypertension   . Nocturia   . Peripheral neuropathy   . Prostate cancer ([place])   . Type 2 diabetes mellitus with insulin therapy ([place])    There were no vitals taken for this  visit.  Opioid Risk Score:   Fall Risk Score:  `1  Depression screen PHQ 2/9  Depression screen [facility] 2/9 04/25/2019 08/27/2018 06/20/2018 03/21/2018 01/07/2018 12/06/2017 09/04/2017  Decreased Interest 0 0 0 1 0 1 0  Down, Depressed, Hopeless 0 0 0 0 0 0 0  PHQ - 2 Score 0 0 0 1 0 1 0  Altered sleeping - 0 - - - - 0  Tired, decreased energy - 0 - - - - 0  Change in appetite - 0 - - - - 0  Feeling bad or failure about yourself  - 0 - - - - 0  Trouble concentrating - - - - - - 0  Moving slowly or fidgety/restless - 0 - - - - 0  Suicidal thoughts - 0 - - - - 0  PHQ-9 Score - 0 - - - - 0  Difficult doing work/chores - Not difficult at all - - - - -  Some recent data might be hidden     Review of Systems  Constitutional: Negative.   HENT: Negative.   Eyes: Negative.   Respiratory: Negative.   Cardiovascular: Negative.   Gastrointestinal: Negative.   Endocrine: Negative.   Genitourinary: Negative.   Musculoskeletal: Positive for myalgias.  Skin: Negative.   Allergic/Immunologic: Negative.   Neurological: Positive for numbness.  Hematological: Negative.   Psychiatric/Behavioral: Negative.   All other systems reviewed and are negative.      Objective:   Physical Exam  webex     Assessment & Plan:  Pt is a 74 yr old R handed male with hx of multiple myeloma- in remission x 4 months, DM with neuropathy, and here for f/u.  1. COn't skelaxin 800 mg up to 3x/day as needed- usually taking 1x/day right now.  2. Can continue Lidoderm patches if necessary- pt says hasn't needed them. However happy to prescribe if necessary.  3. Voltaren gel- continue for knee pain as needed.  4. Xray of back looks good- no issues.  5.  Continue tennis balls for back tightness as needed.   6. Call back AS NEEDED for appointment if things get worse.

## 2019-07-25 NOTE — Patient Instructions (Signed)
Pt is a 74 yr old R handed male with hx of multiple myeloma- in remission x 4 months, DM with neuropathy, and here for f/u.  1. COn't skelaxin 800 mg up to 3x/day as needed- usually taking 1x/day right now.  2. Can continue Lidoderm patches if necessary- pt says hasn't needed them. However happy to prescribe if necessary.  3. Voltaren gel- continue for knee pain as needed.  4. Xray of back looks good- no issues.  5.  Continue tennis balls for back tightness as needed.   6. Call back AS NEEDED for appointment if things get worse.

## 2019-07-28 ENCOUNTER — Other Ambulatory Visit: Payer: Self-pay

## 2019-07-28 ENCOUNTER — Ambulatory Visit: Payer: Medicare HMO | Attending: Internal Medicine | Admitting: Internal Medicine

## 2019-07-28 DIAGNOSIS — Z538 Procedure and treatment not carried out for other reasons: Secondary | ICD-10-CM

## 2019-07-28 NOTE — Progress Notes (Signed)
Pt states his blood sugar this morning is 133  Pt states sometimes he has to take more than 6 units twice a day depending on what he eats

## 2019-07-28 NOTE — Progress Notes (Signed)
Call placed to pt at 10:25 a.mto do telephone visit.  Pt did not answer.  I called back again several minutes later and left message informing him the reason for my call.  Pt called back about 1 hr later stating that he had fallen asleep and missed my call.  Pt rescheduled for tomorrow.

## 2019-07-29 ENCOUNTER — Ambulatory Visit (INDEPENDENT_AMBULATORY_CARE_PROVIDER_SITE_OTHER): Payer: Medicare HMO | Admitting: Internal Medicine

## 2019-07-29 DIAGNOSIS — I10 Essential (primary) hypertension: Secondary | ICD-10-CM | POA: Diagnosis not present

## 2019-07-29 DIAGNOSIS — E1169 Type 2 diabetes mellitus with other specified complication: Secondary | ICD-10-CM | POA: Diagnosis not present

## 2019-07-29 DIAGNOSIS — M48062 Spinal stenosis, lumbar region with neurogenic claudication: Secondary | ICD-10-CM | POA: Diagnosis not present

## 2019-07-29 DIAGNOSIS — Z794 Long term (current) use of insulin: Secondary | ICD-10-CM

## 2019-07-29 DIAGNOSIS — N183 Chronic kidney disease, stage 3 unspecified: Secondary | ICD-10-CM | POA: Diagnosis not present

## 2019-07-29 DIAGNOSIS — C9001 Multiple myeloma in remission: Secondary | ICD-10-CM

## 2019-07-29 DIAGNOSIS — E1122 Type 2 diabetes mellitus with diabetic chronic kidney disease: Secondary | ICD-10-CM

## 2019-07-29 DIAGNOSIS — E785 Hyperlipidemia, unspecified: Secondary | ICD-10-CM | POA: Diagnosis not present

## 2019-07-29 NOTE — Progress Notes (Signed)
Virtual Visit via Telephone Note Due to current restrictions/limitations of in-office visits due to the COVID-19 pandemic, this scheduled clinical appointment was converted to a telehealth visit  I connected with Robert Sparks on 07/29/19 at 12:27 p.m by telephone and verified that I am speaking with the correct person using two identifiers. I am in my office.  The patient is at home.  Only the patient and myself participated in this encounter.  I discussed the limitations, risks, security and privacy concerns of performing an evaluation and management service by telephone and the availability of in person appointments. I also discussed with the patient that there may be a patient responsible charge related to this service. The patient expressed understanding and agreed to proceed.   History of Present Illness: Patient with history of HTN,DM2 with polyneuropathy,HL,CKD stage3, tob dependence, EtOH abuse, prostate CA,OA knee,vit B 12defand multiple myelomain remission, memory changes MMSE 04/2019).  Last seen 04/2019.  Purpose of today's visit is chronic ds management.  MM:  Placed on med that caused rash ? N[place]o.  Med discontinued by his oncologist. -pt has a stable anemia.  DM:  Checking BS 1-3 x a day.  This a.m BS was in the 130s before meals.  Reports BS in 200s 1 hr after meal.  Thinks he needs more than 6 units insulin at times. Doing well with eating habits.  HTN:  BP this morning was 94/66.   Yesterday BP was 136/80.  Pt currently outside and does not want to go back inside to recheck BP at this time.  He does add a little salt to foods.  Denies dizziness, CP, SOB, LE edema  Spinal Stenosis: not using the Lidoderm patches recently.  Feels pain not as much since being on the muscle relaxant Skelaxin.  Helps him sleep too.   Tob dep: 1 pk last 3 days.  States he is trying to quit.  Did not get the patches because his insurance did not pay for it.   Outpatient Encounter  Medications as of 07/29/2019  Medication Sig  . ACCU-CHEK AVIVA PLUS test strip 1 each by Other route See admin instructions.  [name] acyclovir (ZOVIRAX) 400 MG tablet Take 1 tablet (400 mg total) by mouth 2 (two) times daily.  [name] amLODipine (NORVASC) 10 MG tablet Take 1 tablet (10 mg total) by mouth daily.  [name] aspirin EC 81 MG tablet Take 1 tablet (81 mg total) by mouth daily.  [name] b complex vitamins capsule Take 1 capsule by mouth daily.  . diclofenac Sodium (VOLTAREN) 1 % GEL Apply 4 g topically 4 (four) times daily.  [name] donepezil (ARICEPT) 5 MG tablet Take 1 tablet (5 mg total) by mouth at bedtime. For memory issues  . DULoxetine (CYMBALTA) 30 MG capsule Take 1 capsule (30 mg total) by mouth daily. X 1 week then 60 mg daily- for nerve pain/back pain  . ferrous sulfate 325 (65 FE) MG tablet Take 325 mg by mouth daily.  [name] gabapentin (NEURONTIN) 300 MG capsule TAKE 1 CAPSULE BY MOUTH IN THE MORNING AND LUNCH, AND 2 AT BEDTIME  . hydrOXYzine (ATARAX/VISTARIL) 25 MG tablet Take 1 tablet (25 mg total) by mouth 3 (three) times daily as needed for itching (rash).  . insulin aspart protamine - aspart (NOVOLOG 70/30 MIX) (70-30) 100 UNIT/ML FlexPen Inject 0.06 mLs (6 Units total) into the skin 2 (two) times daily with a meal.  . metaxalone (SKELAXIN) 800 MG tablet Take 1 tablet (800 mg total) by mouth at  bedtime as needed for muscle spasms.  . Multiple Vitamin (MULTIVITAMIN WITH MINERALS) TABS tablet Take 1 tablet by mouth daily.  . pravastatin (PRAVACHOL) 20 MG tablet TAKE 1 TABLET BY MOUTH DAILY.  [name] predniSONE (DELTASONE) 20 MG tablet Take 2 tablets (40 mg total) by mouth daily with breakfast for 5 days.  . TRUEPLUS PEN NEEDLES 32G X 4 MM MISC USE AS DIRECTED TWICE DAILY WITH INSULIN  . vitamin B-12 (CYANOCOBALAMIN) 100 MCG tablet Take 100 mcg by mouth daily.  . ixazomib citrate (NINLARO) 3 MG capsule Take 1 capsule (3 mg) by mouth weekly, 3 weeks on, 1 week off, repeat every 4 weeks. Take on an empty stomach  1hr before or 2hr after meals. (Patient not taking: Reported on 07/29/2019)  . [DISCONTINUED] acetaminophen-codeine (TYLENOL #3) 300-30 MG tablet TAKE ONE TABLET BY MOUTH EVERY 8 HOURS AS NEEDED FOR MODERATE PAIN  . [DISCONTINUED] Cholecalciferol (VITAMIN D) 2000 units CAPS Take 2,000 Units by mouth daily.  . [DISCONTINUED] cyclobenzaprine (FLEXERIL) 10 MG tablet Take 1 tablet (10 mg total) by mouth at bedtime. To relax muscles of low back  . [DISCONTINUED] diclofenac sodium (VOLTAREN) 1 % GEL Apply 2 g topically 4 (four) times daily.  . [DISCONTINUED] lidocaine (LIDODERM) 5 % Place 2 patches onto the skin every 12 (twelve) hours. place on each side of spine on low back- put on for 12 hrs; take off for 12 hours.  . [DISCONTINUED] Miconazole Nitrate 2 % AERP Apply 1 application topically 2 (two) times daily. Apply to fungal rash between on feet  . [DISCONTINUED] NON FORMULARY [facility]   CREAMS- #11  . [DISCONTINUED] tadalafil (ADCIRCA/CIALIS) 20 MG tablet Take 20 mg by mouth daily as needed for erectile dysfunction.   . [DISCONTINUED] triamcinolone cream (KENALOG) 0.1 % Apply 1 application topically 3 (three) times daily as needed.   No facility-administered encounter medications on file as of 07/29/2019.      Observations/Objective: Lab Results  Component Value Date   WBC 6.5 06/17/2019   HGB 10.7 (L) 06/17/2019   HCT 31.6 (L) 06/17/2019   MCV 101.9 (H) 06/17/2019   PLT 192 06/17/2019     Chemistry      Component Value Date/Time   NA 138 06/17/2019 1401   NA 138 12/06/2017 0920   NA 137 07/13/2017 0938   K 3.9 06/17/2019 1401   K 3.3 (L) 07/13/2017 0938   CL 105 06/17/2019 1401   CO2 23 06/17/2019 1401   CO2 25 07/13/2017 0938   BUN 21 06/17/2019 1401   BUN 42 (H) 12/06/2017 0920   BUN 23.8 07/13/2017 0938   CREATININE 1.57 (H) 06/17/2019 1401   CREATININE 1.7 (H) 07/13/2017 0938      Component Value Date/Time   CALCIUM 9.2 06/17/2019 1401   CALCIUM 9.3  07/13/2017 0938   ALKPHOS 90 06/17/2019 1401   ALKPHOS 119 07/13/2017 0938   AST 40 06/17/2019 1401   AST 14 07/13/2017 0938   ALT 40 06/17/2019 1401   ALT 13 07/13/2017 0938   BILITOT 0.5 06/17/2019 1401   BILITOT 0.45 07/13/2017 0938     Lab Results  Component Value Date   CHOL 184 04/25/2019   HDL 88 04/25/2019   LDLCALC 76 04/25/2019   TRIG 114 04/25/2019   CHOLHDL 2.1 04/25/2019   Lab Results  Component Value Date   HGBA1C 6.2 04/25/2019     Assessment and Plan: 1. Controlled type 2 diabetes mellitus with stage 3 chronic kidney disease, with long-term  current use of insulin ([place]) -Patient to continue NovoLog 70/30 6 units twice a day with meals.  Advised patient that if he checks blood sugars after meals it should be 2 hours after meal and the goal is to be less than 180. Continue healthy eating habits  2. Essential hypertension Reported blood pressure today is low but patient denies any symptoms of dizziness or weakness.  I request that he recheck his blood pressure later today and if still running low please call and let me know so that we can adjust the amlodipine.  3. Multiple myeloma in remission ([place]) Followed by Dr. Irene Limbo.  In remission  4. Spinal stenosis, lumbar region with neurogenic claudication Reportedly doing okay on Skelaxin, gabapentin and Cymbalta.  Denies any recent falls.  5. Hyperlipidemia associated with type 2 diabetes mellitus ([place]) LDL close to goal.  Continue pravastatin   Follow Up Instructions: 3 mth   I discussed the assessment and treatment plan with the patient. The patient was provided an opportunity to ask questions and all were answered. The patient agreed with the plan and demonstrated an understanding of the instructions.   The patient was advised to call back or seek an in-person evaluation if the symptoms worsen or if the condition fails to improve as anticipated.  I provided 12 minutes of non-face-to-face time during this  encounter.   Karle Plumber, MD

## 2019-07-31 MED FILL — ACCU-CHEK AVIVA PLUS STRP: 33 days supply | Qty: 100 | Fill #10

## 2019-08-11 MED FILL — NOVOLOG MIX 70-30 FLEXPEN S: (70-30) 100 | 25 days supply | Qty: 3 | Fill #1

## 2019-08-12 ENCOUNTER — Encounter: Payer: Self-pay | Admitting: Podiatry

## 2019-08-12 ENCOUNTER — Ambulatory Visit (INDEPENDENT_AMBULATORY_CARE_PROVIDER_SITE_OTHER): Payer: Medicare HMO | Admitting: Podiatry

## 2019-08-12 ENCOUNTER — Other Ambulatory Visit: Payer: Self-pay

## 2019-08-12 DIAGNOSIS — L84 Corns and callosities: Secondary | ICD-10-CM | POA: Diagnosis not present

## 2019-08-12 DIAGNOSIS — M79674 Pain in right toe(s): Secondary | ICD-10-CM

## 2019-08-12 DIAGNOSIS — E1142 Type 2 diabetes mellitus with diabetic polyneuropathy: Secondary | ICD-10-CM

## 2019-08-12 DIAGNOSIS — B351 Tinea unguium: Secondary | ICD-10-CM

## 2019-08-12 DIAGNOSIS — Q828 Other specified congenital malformations of skin: Secondary | ICD-10-CM

## 2019-08-12 DIAGNOSIS — M79675 Pain in left toe(s): Secondary | ICD-10-CM | POA: Diagnosis not present

## 2019-08-12 NOTE — Patient Instructions (Signed)
Diabetes Mellitus and Foot Care Foot care is an important part of your health, especially when you have diabetes. Diabetes may cause you to have problems because of poor blood flow (circulation) to your feet and legs, which can cause your skin to:  Become thinner and drier.  Break more easily.  Heal more slowly.  Peel and crack. You may also have nerve damage (neuropathy) in your legs and feet, causing decreased feeling in them. This means that you may not notice minor injuries to your feet that could lead to more serious problems. Noticing and addressing any potential problems early is the best way to prevent future foot problems. How to care for your feet Foot hygiene  Wash your feet daily with warm water and mild soap. Do not use hot water. Then, pat your feet and the areas between your toes until they are completely dry. Do not soak your feet as this can dry your skin.  Trim your toenails straight across. Do not dig under them or around the cuticle. File the edges of your nails with an emery board or nail file.  Apply a moisturizing lotion or petroleum jelly to the skin on your feet and to dry, brittle toenails. Use lotion that does not contain alcohol and is unscented. Do not apply lotion between your toes. Shoes and socks  Wear clean socks or stockings every day. Make sure they are not too tight. Do not wear knee-high stockings since they may decrease blood flow to your legs.  Wear shoes that fit properly and have enough cushioning. Always look in your shoes before you put them on to be sure there are no objects inside.  To break in new shoes, wear them for just a few hours a day. This prevents injuries on your feet. Wounds, scrapes, corns, and calluses  Check your feet daily for blisters, cuts, bruises, sores, and redness. If you cannot see the bottom of your feet, use a mirror or ask someone for help.  Do not cut corns or calluses or try to remove them with medicine.  If you  find a minor scrape, cut, or break in the skin on your feet, keep it and the skin around it clean and dry. You may clean these areas with mild soap and water. Do not clean the area with peroxide, alcohol, or iodine.  If you have a wound, scrape, corn, or callus on your foot, look at it several times a day to make sure it is healing and not infected. Check for: ? Redness, swelling, or pain. ? Fluid or blood. ? Warmth. ? Pus or a bad smell. General instructions  Do not cross your legs. This may decrease blood flow to your feet.  Do not use heating pads or hot water bottles on your feet. They may burn your skin. If you have lost feeling in your feet or legs, you may not know this is happening until it is too late.  Protect your feet from hot and cold by wearing shoes, such as at the beach or on hot pavement.  Schedule a complete foot exam at least once a year (annually) or more often if you have foot problems. If you have foot problems, report any cuts, sores, or bruises to your health care provider immediately. Contact a health care provider if:  You have a medical condition that increases your risk of infection and you have any cuts, sores, or bruises on your feet.  You have an injury that is not   healing.  You have redness on your legs or feet.  You feel burning or tingling in your legs or feet.  You have pain or cramps in your legs and feet.  Your legs or feet are numb.  Your feet always feel cold.  You have pain around a toenail. Get help right away if:  You have a wound, scrape, corn, or callus on your foot and: ? You have pain, swelling, or redness that gets worse. ? You have fluid or blood coming from the wound, scrape, corn, or callus. ? Your wound, scrape, corn, or callus feels warm to the touch. ? You have pus or a bad smell coming from the wound, scrape, corn, or callus. ? You have a fever. ? You have a red line going up your leg. Summary  Check your feet every day  for cuts, sores, red spots, swelling, and blisters.  Moisturize feet and legs daily.  Wear shoes that fit properly and have enough cushioning.  If you have foot problems, report any cuts, sores, or bruises to your health care provider immediately.  Schedule a complete foot exam at least once a year (annually) or more often if you have foot problems. This information is not intended to replace advice given to you by your health care provider. Make sure you discuss any questions you have with your health care provider. Document Revised: 03/19/2019 Document Reviewed: 07/28/2016 Elsevier Patient Education  2020 [employer] Inc.  

## 2019-08-15 ENCOUNTER — Other Ambulatory Visit: Payer: Self-pay | Admitting: *Deleted

## 2019-08-15 DIAGNOSIS — C9001 Multiple myeloma in remission: Secondary | ICD-10-CM

## 2019-08-16 NOTE — Progress Notes (Signed)
Subjective: Robert Sparks presents today for follow up of preventative diabetic foot care and painful porokeratotic lesion(s) right hallux and callus of first metatarsal head left foot and painful mycotic toenails b/l that limit ambulation. Aggravating factors include weightbearing with and without shoe gear. Pain for both is relieved with periodic professional debridement..   Allergies  Allergen Reactions  . Other Other (See Comments)    Nicoderm CQ = Bad Dreams Nicotine gum = Bad Dreams      Objective: There were no vitals filed for this visit.  Vascular Examination:  Capillary refill time to digits immediate b/l, palpable DP pulses b/l, palpable PT pulses b/l, pedal hair absent b/l and skin temperature gradient within normal limits b/l  Dermatological Examination: Pedal skin with normal turgor, texture and tone bilaterally, no open wounds bilaterally, no interdigital macerations bilaterally, toenails 1-5 b/l elongated, dystrophic, thickened, crumbly with subungual debris, hyperkeratotic lesion(s) 1st metatarsal head left foot.  No erythema, no edema, no drainage, no flocculence and porokeratotic lesion(s) plantar right hallux. No erythema, no edema, no drainage, no flocculence  Musculoskeletal: Normal muscle strength 5/5 to all lower extremity muscle groups bilaterally, no pain crepitus or joint limitation noted with ROM b/l and bunion deformity noted b/l  Neurological: Protective sensation absent with 10g monofilament b/l  Assessment: Pain due to onychomycosis of toenails of both feet  Callus  Porokeratosis  Diabetic peripheral neuropathy associated with type 2 diabetes mellitus ([place])  Plan: -Continue diabetic foot care principles. Literature dispensed on today.  -Toenails 1-5 b/l were debrided in length and girth without iatrogenic bleeding. -corns and calluses were debrided without complication or incident. Total number debrided =1 left 1st metatarsal head -Painful  porokeratotic lesions right hallux pared and enucleated with sterile scalpel blade -Patient to continue soft, supportive shoe gear daily. -Patient to report any pedal injuries to medical professional immediately. -Patient/POA to call should there be question/concern in the interim.  Return in about 3 months (around 11/09/2019) for diabetic nail and callus trim.

## 2019-08-18 ENCOUNTER — Inpatient Hospital Stay: Payer: Medicare HMO

## 2019-08-18 ENCOUNTER — Other Ambulatory Visit: Payer: Self-pay

## 2019-08-18 ENCOUNTER — Inpatient Hospital Stay: Payer: Medicare HMO | Attending: Hematology | Admitting: Hematology

## 2019-08-18 VITALS — BP 146/82 | HR 76 | Temp 98.3°F | Resp 17 | Ht 65.0 in | Wt 164.6 lb

## 2019-08-18 DIAGNOSIS — C9001 Multiple myeloma in remission: Secondary | ICD-10-CM | POA: Diagnosis not present

## 2019-08-18 DIAGNOSIS — L270 Generalized skin eruption due to drugs and medicaments taken internally: Secondary | ICD-10-CM | POA: Diagnosis not present

## 2019-08-18 DIAGNOSIS — G629 Polyneuropathy, unspecified: Secondary | ICD-10-CM | POA: Diagnosis not present

## 2019-08-18 LAB — CBC WITH DIFFERENTIAL (CANCER CENTER ONLY)
Abs Immature Granulocytes: 0 10*3/uL (ref 0.00–0.07)
Basophils Absolute: 0 10*3/uL (ref 0.0–0.1)
Basophils Relative: 0 %
Eosinophils Absolute: 0.9 10*3/uL — ABNORMAL HIGH (ref 0.0–0.5)
Eosinophils Relative: 17 %
HCT: 31.6 % — ABNORMAL LOW (ref 39.0–52.0)
Hemoglobin: 10.6 g/dL — ABNORMAL LOW (ref 13.0–17.0)
Immature Granulocytes: 0 %
Lymphocytes Relative: 25 %
Lymphs Abs: 1.3 10*3/uL (ref 0.7–4.0)
MCH: 34.5 pg — ABNORMAL HIGH (ref 26.0–34.0)
MCHC: 33.5 g/dL (ref 30.0–36.0)
MCV: 102.9 fL — ABNORMAL HIGH (ref 80.0–100.0)
Monocytes Absolute: 0.4 10*3/uL (ref 0.1–1.0)
Monocytes Relative: 8 %
Neutro Abs: 2.5 10*3/uL (ref 1.7–7.7)
Neutrophils Relative %: 50 %
Platelet Count: 154 10*3/uL (ref 150–400)
RBC: 3.07 MIL/uL — ABNORMAL LOW (ref 4.22–5.81)
RDW: 13 % (ref 11.5–15.5)
WBC Count: 5 10*3/uL (ref 4.0–10.5)
nRBC: 0 % (ref 0.0–0.2)

## 2019-08-18 LAB — CMP (CANCER CENTER ONLY)
ALT: 19 U/L (ref 0–44)
AST: 17 U/L (ref 15–41)
Albumin: 3.7 g/dL (ref 3.5–5.0)
Alkaline Phosphatase: 96 U/L (ref 38–126)
Anion gap: 10 (ref 5–15)
BUN: 24 mg/dL — ABNORMAL HIGH (ref 8–23)
CO2: 23 mmol/L (ref 22–32)
Calcium: 9.1 mg/dL (ref 8.9–10.3)
Chloride: 108 mmol/L (ref 98–111)
Creatinine: 1.73 mg/dL — ABNORMAL HIGH (ref 0.61–1.24)
GFR, Est AFR Am: 45 mL/min — ABNORMAL LOW (ref >60)
GFR, Estimated: 39 mL/min — ABNORMAL LOW (ref >60)
Glucose, Bld: 179 mg/dL — ABNORMAL HIGH (ref 70–99)
Potassium: 3.8 mmol/L (ref 3.5–5.1)
Sodium: 141 mmol/L (ref 135–145)
Total Bilirubin: 0.4 mg/dL (ref 0.3–1.2)
Total Protein: 6.4 g/dL — ABNORMAL LOW (ref 6.5–8.1)

## 2019-08-18 NOTE — Progress Notes (Signed)
HEMATOLOGY/ONCOLOGY CLINIC NOTE  Date of Service: 08/18/2019  Patient Care Team: [name], MD as PCP - General (Internal Medicine) [name], MD (Inactive) as Consulting Physician (Hematology and Oncology)  CHIEF COMPLAINTS/PURPOSE OF CONSULTATION:  F/u for continued mx of Multiple Myeloma  Oncologic History:  74 y.o. male with diagnosis of IgA lambda active multiple myeloma, ISS Stage I. Active disease diagnosed based on presence of anemia, kidney insufficiency, and paraproteinemia with significant predominance of lambda light chains as well as significant elevation of IgA. Bone marrow biopsy confirmed presence of atypical monoclonal plasma cell process in the bone marrow comprising at least 7% of the cellularity. Based on the findings, patient was started on treatment with lenalidomide, bortezomib, and low-dose dexamethasone based on the anticipated tolerance by the patient. Lenalidomide was started at 10 mg daily based on creatinine clearance of 42, dexamethasone dose was reduced to 20 mg weekly based on patient's age. Treatment was complicated by rapid development of cutaneous rash attributed to lenalidomide, inaddition to decreased renal function and now patient has persistent severe anemia. Side-effects were attributed to Lenalidomide and lenalidomide was discontinued with subsequent recovery.  Patient has been receiving bortezomib weekly with low-dose dexamethasone in 4-day cycles.  Patient has completed induction systemic therapy for his disease with repeat bone marrow biopsy confirming complete response including no evidence of minimal residual disease by cytogenetics or FISH.  HISTORY OF PRESENTING ILLNESS:   Robert Sparks is a wonderful 74 y.o. male who has been referred to [place] by my colleague Dr Grace Isaac for evaluation and management of Multiple Myeloma. The pt reports that he is doing well overall.   The pt has been previously followed by my colleague  Dr Grace Isaac. The pt was treated with Revlimid, Velcade, and dexamethasone and was subsequently switched to Velcade and Dexamethasone after a Revlimid intolerance.   The pt reports some knee aches and skin soreness in his legs which is worse at night. He notes that he has had diabetic-related peripheral neuropathy. The pt has not seen [facility] yet for a discussion of a BM transplant. He continues taking Acyclovir and notes no difficulty taking maintenance Velcade.   He adds that he takes Gabapentin and sweats.  He notes that his neuropathy is currently stable and denies it worsening.   He notes that a pack of cigarettes lasts him for about 3 days.   Most recent lab results (12/14/17) of CBC and CMP  is as follows: all values are WNL except for RBC at 3.08, HGB at 10.1, HCT at 31.0, MCV at 100.6, Eosinophils Abs at 1.4k, Potassium at 3.4, Glucose at 156, Creatinine at 1.60. LDH 12/14/17 is 141 Beta 2 microglobulin 12/14/17 is 2.4  On review of systems, pt reports knee pain, night time LE skin soreness, peripheral neuropathy, eating well, strong appetite, intermittent sweating and denies fevers, chills, back pains, abdominal pains, and any other symptoms.   [name] DM type 2, HTN, CKD. On Social Hx the pt reports smoking a pack of cigarettes every 3 days.   [name] returns today regarding management and evaluation of his Multiple Myeloma. [name] was on 06/17/2019. The pt reports that he is doing well overall.  The pt reports that he had a rash on his abdomen and left arm that began soon after restarting Ninlaro. He is unsure if it was a raised or flat rash but it was  extremely itchy. The rash went away less than a week after stopping Ninlaro and beginning Prednisone. Pt has been feeling well and eating well since our last visit. Pt notes that his back gets "tired" sometimes after walking or standing for an extended period of  time. He drinks an adequate amount of water daily.  Lab results today (08/18/19) of CBC w/diff and CMP is as follows: all values are WNL except for RBC at 3.07, Hgb at 10.6, HCT at 31.6, MCV at 102.9, MCH at 34.5, Eos Abs at 0.9K, Glucose at 179, BUN at 24, Creatinine at 1.73, Total Protein at 6.4 GFR Est Afr Am at 45. 08/18/2019 MMP is in progress  On review of systems, pt reports back pain and denies numbness/tingling in hands/feet, new rashes, abdominal pain, leg swelling and any other symptoms.   MEDICAL HISTORY:  Past Medical History:  Diagnosis Date  . Anemia   . Arthritis    shoulders,feet   . Cataract    per eye dr -has appt 5-11  . CKD (chronic kidney disease), stage III   . Elevated PSA   . History of ketoacidosis    03-29-2015   . History of sepsis    07-25-2013  non-compliant w/ medication  . Hyperlipidemia   . Hypertension   . Nocturia   . Peripheral neuropathy   . Prostate cancer ([place])   . Type 2 diabetes mellitus with insulin therapy ([place])     SURGICAL HISTORY: Past Surgical History:  Procedure Laterality Date  . CIRCUMCISION  1990's  . PROSTATE BIOPSY N/A 12/21/2015   Procedure: BIOPSY TRANSRECTAL ULTRASONIC PROSTATE (TUBP);  Surgeon: Festus Aloe, MD;  Location: [facility];  Service: Urology;  Laterality: N/A;    SOCIAL HISTORY: Social History   Socioeconomic History  . Marital status: Single    Spouse name: Not on file  . Number of children: Not on file  . Years of education: Not on file  . Highest education level: Not on file  Occupational History  . Not on file  Tobacco Use  . Smoking status: Current Every Day Smoker    Packs/day: 0.50    Years: 50.00    Pack years: 25.00    Types: Cigarettes  . Smokeless tobacco: Never Used  . Tobacco comment: 1 ppwk-4-5 a day   Substance and Sexual Activity  . Alcohol use: Yes    Alcohol/week: 1.0 standard drinks    Types: 1 Cans of beer per week    Comment: 1 every day-quit couple  weeks ago  . Drug use: No  . Sexual activity: Not Currently  Other Topics Concern  . Not on file  Social History Narrative  . Not on file   Social Determinants of Health   Financial Resource Strain:   . Difficulty of Paying Living Expenses: Not on file  Food Insecurity:   . Worried About Charity fundraiser in the Last Year: Not on file  . Ran Out of Food in the Last Year: Not on file  Transportation Needs:   . Lack of Transportation (Medical): Not on file  . Lack of Transportation (Non-Medical): Not on file  Physical Activity:   . Days of Exercise per Week: Not on file  . Minutes of Exercise per Session: Not on file  Stress:   . Feeling of Stress : Not on file  Social Connections:   . Frequency of Communication with Friends and Family: Not on file  . Frequency of Social Gatherings with  Friends and Family: Not on file  . Attends Religious Services: Not on file  . Active Member of Clubs or Organizations: Not on file  . Attends Archivist Meetings: Not on file  . Marital Status: Not on file  Intimate Partner Violence:   . Fear of Current or Ex-Partner: Not on file  . Emotionally Abused: Not on file  . Physically Abused: Not on file  . Sexually Abused: Not on file    FAMILY HISTORY: Family History  Problem Relation Age of Onset  . Kidney disease Mother   . Cancer Neg Hx   . Colon cancer Neg Hx   . Colon polyps Neg Hx   . Esophageal cancer Neg Hx   . Rectal cancer Neg Hx   . Stomach cancer Neg Hx     ALLERGIES:  is allergic to other.  MEDICATIONS:  Current Outpatient Medications  Medication Sig Dispense Refill  . ACCU-CHEK AVIVA PLUS test strip 1 each by Other route See admin instructions. 100 each 12  . acyclovir (ZOVIRAX) 400 MG tablet Take 1 tablet (400 mg total) by mouth 2 (two) times daily. 60 tablet 5  . amLODipine (NORVASC) 10 MG tablet Take 1 tablet (10 mg total) by mouth daily. 30 tablet 6  . aspirin EC 81 MG tablet Take 1 tablet (81 mg total)  by mouth daily. 90 tablet 3  . b complex vitamins capsule Take 1 capsule by mouth daily. 30 capsule 5  . diclofenac Sodium (VOLTAREN) 1 % GEL Apply 4 g topically 4 (four) times daily. 150 g 5  . donepezil (ARICEPT) 5 MG tablet Take 1 tablet (5 mg total) by mouth at bedtime. For memory issues 30 tablet 2  . DULoxetine (CYMBALTA) 30 MG capsule Take 1 capsule (30 mg total) by mouth daily. X 1 week then 60 mg daily- for nerve pain/back pain 60 capsule 5  . ferrous sulfate 325 (65 FE) MG tablet Take 325 mg by mouth daily.    [name] gabapentin (NEURONTIN) 300 MG capsule TAKE 1 CAPSULE BY MOUTH IN THE MORNING AND LUNCH, AND 2 AT BEDTIME 120 capsule 6  . hydrOXYzine (ATARAX/VISTARIL) 25 MG tablet Take 1 tablet (25 mg total) by mouth 3 (three) times daily as needed for itching (rash). 30 tablet 0  . insulin aspart protamine - aspart (NOVOLOG 70/30 MIX) (70-30) 100 UNIT/ML FlexPen Inject 0.06 mLs (6 Units total) into the skin 2 (two) times daily with a meal. 15 mL 6  . metaxalone (SKELAXIN) 800 MG tablet Take 1 tablet (800 mg total) by mouth at bedtime as needed for muscle spasms. 30 tablet 3  . Multiple Vitamin (MULTIVITAMIN WITH MINERALS) TABS tablet Take 1 tablet by mouth daily. 60 tablet 2  . pravastatin (PRAVACHOL) 20 MG tablet TAKE 1 TABLET BY MOUTH DAILY. 30 tablet 2  . TRUEPLUS PEN NEEDLES 32G X 4 MM MISC USE AS DIRECTED TWICE DAILY WITH INSULIN 100 each 0  . vitamin B-12 (CYANOCOBALAMIN) 100 MCG tablet Take 100 mcg by mouth daily.    . Vitamins/Minerals TABS Take by mouth.    . ixazomib citrate (NINLARO) 3 MG capsule Take 1 capsule (3 mg) by mouth weekly, 3 weeks on, 1 week off, repeat every 4 weeks. Take on an empty stomach 1hr before or 2hr after meals. (Patient not taking: Reported on 07/29/2019) 3 capsule 3   No current facility-administered medications for this visit.    REVIEW OF SYSTEMS:   A 10+ POINT REVIEW OF SYSTEMS WAS OBTAINED  including neurology, dermatology, psychiatry, cardiac,  respiratory, lymph, extremities, GI, GU, Musculoskeletal, constitutional, breasts, reproductive, HEENT.  All pertinent positives are noted in the HPI.  All others are negative.   PHYSICAL EXAMINATION: ECOG PERFORMANCE STATUS: 1 - Symptomatic but completely ambulatory Exam was given in a chair  .BP (!) 146/82 (BP Location: Left Arm, Patient Position: Sitting) Comment: Sandi Rn notified  Pulse 76   Temp 98.3 F (36.8 C) (Temporal)   Resp 17   Ht '5\' 5"'  (1.651 m)   Wt 164 lb 9.6 oz (74.7 kg)   SpO2 100%   BMI 27.39 kg/m    GENERAL:alert, in no acute distress and comfortable SKIN: no acute rashes, no significant lesions EYES: conjunctiva are pink and non-injected, sclera anicteric OROPHARYNX: MMM, no exudates, no oropharyngeal erythema or ulceration NECK: supple, no JVD LYMPH:  no palpable lymphadenopathy in the cervical, axillary or inguinal regions LUNGS: clear to auscultation b/l with normal respiratory effort HEART: regular rate & rhythm ABDOMEN:  normoactive bowel sounds , non tender, not distended. No palpable hepatosplenomegaly.  Extremity: no pedal edema PSYCH: alert & oriented x 3 with fluent speech NEURO: no focal motor/sensory deficits  LABORATORY DATA:  I have reviewed the data as listed  . CBC Latest Ref Rng & Units 08/18/2019 06/17/2019 04/15/2019  WBC 4.0 - 10.5 K/uL 5.0 6.5 5.9  Hemoglobin 13.0 - 17.0 g/dL 10.6(L) 10.7(L) 10.8(L)  Hematocrit 39.0 - 52.0 % 31.6(L) 31.6(L) 33.1(L)  Platelets 150 - 400 K/uL 154 192 207    . CMP Latest Ref Rng & Units 08/18/2019 06/17/2019 04/15/2019  Glucose 70 - 99 mg/dL 179(H) 128(H) 134(H)  BUN 8 - 23 mg/dL 24(H) 21 24(H)  Creatinine 0.61 - 1.24 mg/dL 1.73(H) 1.57(H) 1.49(H)  Sodium 135 - 145 mmol/L 141 138 139  Potassium 3.5 - 5.1 mmol/L 3.8 3.9 4.0  Chloride 98 - 111 mmol/L 108 105 105  CO2 22 - 32 mmol/L '23 23 26  ' Calcium 8.9 - 10.3 mg/dL 9.1 9.2 9.4  Total Protein 6.5 - 8.1 g/dL 6.4(L) 7.1 7.0  Total Bilirubin 0.3 - 1.2 mg/dL  0.4 0.5 0.5  Alkaline Phos 38 - 126 U/L 96 90 99  AST 15 - 41 U/L 17 40 30  ALT 0 - 44 U/L 19 40 33   08/29/17 BM Bx:    08/29/17 Cytogenetics:   03/13/17 Cytogenetics:          RADIOGRAPHIC STUDIES: I have personally reviewed the radiological images as listed and agreed with the findings in the report. No results found.  ASSESSMENT & PLAN:   74 y.o. male with   1. IgA Lambda Multiple Myeloma ISS Stage I -Currently in remission  Active disease was previously diagnosed based on presence of anemia, kidney insufficiency, and paraproteinemia with significant predominance of lambda light chains as well as significant elevation of IgA.  PLAN:  -Discussed pt labwork today, 08/18/19; blood counts and chemistries are steady -Discussed 08/18/2019 MMP is in progress, 06/17/2019 MMP shows no M spike -Will hold Ninlaro at this time due to stable Myeloma and recent rash -No lab or clinical indication of Multiple Myeloma return or progression at this time -Advised pt that if we were to restart Ninlaro we would also start him on an anti-allergy medication like Pepcid -Recommended that the pt continue to eat well, drink at least 48-64 oz of water each day, and walk 20-30 minutes each day.  -Will see back in 3 months with labs    FOLLOW UP: RTC  with Dr Irene Limbo with labs in 3 months  The total time spent in the appt was 20 minutes and more than 50% was on counseling and direct patient cares.  All of the patient's questions were answered with apparent satisfaction. The patient knows to call the clinic with any problems, questions or concerns.  Sullivan Lone MD [place] AAHIVMS [facility] Hematology/Oncology Physician [facility]  (Office):       [phone] (Work cell):  [phone] (Fax):           [phone]  08/18/2019 3:18 PM  I, Yevette Edwards, am acting as a scribe for Dr. Sullivan Lone.   .I have reviewed the above documentation for accuracy and completeness, and I  agree with the above. Brunetta Genera MD

## 2019-08-20 ENCOUNTER — Telehealth: Payer: Self-pay | Admitting: Hematology

## 2019-08-20 LAB — MULTIPLE MYELOMA PANEL, SERUM
Albumin SerPl Elph-Mcnc: 3.5 g/dL (ref 2.9–4.4)
Albumin/Glob SerPl: 1.5 (ref 0.7–1.7)
Alpha 1: 0.2 g/dL (ref 0.0–0.4)
Alpha2 Glob SerPl Elph-Mcnc: 0.7 g/dL (ref 0.4–1.0)
B-Globulin SerPl Elph-Mcnc: 0.8 g/dL (ref 0.7–1.3)
Gamma Glob SerPl Elph-Mcnc: 0.8 g/dL (ref 0.4–1.8)
Globulin, Total: 2.5 g/dL (ref 2.2–3.9)
IgA: 135 mg/dL (ref 61–437)
IgG (Immunoglobin G), Serum: 747 mg/dL (ref 603–1613)
IgM (Immunoglobulin M), Srm: 78 mg/dL (ref 15–143)
Total Protein ELP: 6 g/dL (ref 6.0–8.5)

## 2019-08-20 MED FILL — GABAPENTIN 300 MG CAPSULE: 300 | 30 days supply | Qty: 120 | Fill #2

## 2019-08-20 MED FILL — ACCU-CHEK AVIVA PLUS STRP: 33 days supply | Qty: 100 | Fill #11

## 2019-08-20 NOTE — Telephone Encounter (Signed)
Scheduled per 02/09 los, patient will be notified.

## 2019-08-21 MED FILL — METAXALONE 800 MG TABLET: 800 | 30 days supply | Qty: 30 | Fill #2

## 2019-08-22 MED FILL — BUPROPION SR 150 MG TABLET: 150 | 30 days supply | Qty: 60 | Fill #0

## 2019-08-28 MED FILL — PRAVASTATIN SODIUM 20 MG TA: 20 | 30 days supply | Qty: 30 | Fill #1

## 2019-08-28 MED FILL — AMLODIPINE BESYLATE 10 MG T: 10 | 30 days supply | Qty: 30 | Fill #2

## 2019-09-01 MED FILL — NOVOLOG MIX 70-30 FLEXPEN S: (70-30) 100 | 25 days supply | Qty: 3 | Fill #2

## 2019-09-05 ENCOUNTER — Ambulatory Visit: Payer: Medicare HMO | Attending: Internal Medicine

## 2019-09-05 DIAGNOSIS — Z23 Encounter for immunization: Secondary | ICD-10-CM | POA: Insufficient documentation

## 2019-09-05 NOTE — Progress Notes (Signed)
   Covid-19 Vaccination Clinic  Name:  Robert Sparks    MRN: [id number] DOB: 10/09/1945  09/05/2019  Mr. Sayler was observed post Covid-19 immunization for 15 minutes without incidence. He was provided with Vaccine Information Sheet and instruction to access the V-Safe system.   Mr. Skillings was instructed to call 911 with any severe reactions post vaccine: [name] Difficulty breathing  . Swelling of your face and throat  . A fast heartbeat  . A bad rash all over your body  . Dizziness and weakness    Immunizations Administered    Name Date Dose VIS Date Route   Pfizer COVID-19 Vaccine 09/05/2019  1:50 PM 0.3 mL 06/20/2019 Intramuscular   Manufacturer: [place]   Lot: [id number]   [place]: [id number]

## 2019-09-11 ENCOUNTER — Telehealth: Payer: Self-pay | Admitting: *Deleted

## 2019-09-11 NOTE — Telephone Encounter (Signed)
Prior authorization submitted via covermymeds for metaxalone.  Approved through 07/09/2020

## 2019-09-16 MED FILL — LIDOCAINE 5 % PTCH: 5 | 30 days supply | Qty: 60 | Fill #0

## 2019-09-16 MED FILL — METAXALONE 800 MG TABLET: 800 | 30 days supply | Qty: 30 | Fill #3

## 2019-09-17 ENCOUNTER — Other Ambulatory Visit: Payer: Self-pay | Admitting: Internal Medicine

## 2019-09-17 MED FILL — ACCU-CHEK AVIVA PLUS STRP: 33 days supply | Qty: 100 | Fill #0

## 2019-09-23 MED FILL — NOVOLOG MIX 70-30 FLEXPEN S: (70-30) 100 | 25 days supply | Qty: 3 | Fill #3

## 2019-09-23 MED FILL — GABAPENTIN 300 MG CAPSULE: 300 | 30 days supply | Qty: 120 | Fill #3

## 2019-09-29 MED FILL — ACYCLOVIR 400 MG TABLET: 400 | 30 days supply | Qty: 60 | Fill #5

## 2019-09-29 MED FILL — PRAVASTATIN SODIUM 20 MG TA: 20 | 30 days supply | Qty: 30 | Fill #2

## 2019-10-01 ENCOUNTER — Ambulatory Visit: Payer: Medicare HMO | Attending: Internal Medicine

## 2019-10-01 DIAGNOSIS — Z23 Encounter for immunization: Secondary | ICD-10-CM

## 2019-10-01 NOTE — Progress Notes (Signed)
   Covid-19 Vaccination Clinic  Name:  Robert Sparks    MRN: [id number] DOB: 06/29/1946  10/01/2019  Robert Sparks was observed post Covid-19 immunization for 15 minutes without incident. He was provided with Vaccine Information Sheet and instruction to access the V-Safe system.   Robert Sparks was instructed to call 911 with any severe reactions post vaccine: [name] Difficulty breathing  . Swelling of face and throat  . A fast heartbeat  . A bad rash all over body  . Dizziness and weakness   Immunizations Administered    Name Date Dose VIS Date Route   Pfizer COVID-19 Vaccine 10/01/2019  9:03 AM 0.3 mL 06/20/2019 Intramuscular   Manufacturer: [place]   Lot: [id number]   [place]: [id number]

## 2019-10-14 ENCOUNTER — Other Ambulatory Visit: Payer: Self-pay | Admitting: Physical Medicine and Rehabilitation

## 2019-10-14 MED FILL — NOVOLOG MIX 70-30 FLEXPEN S: (70-30) 100 | 25 days supply | Qty: 3 | Fill #4

## 2019-10-14 MED FILL — ACCU-CHEK AVIVA PLUS STRP: 33 days supply | Qty: 100 | Fill #1

## 2019-10-15 MED FILL — METAXALONE 800 MG TABLET: 800 | 30 days supply | Qty: 30 | Fill #0

## 2019-10-20 MED FILL — AMLODIPINE BESYLATE 10 MG T: 10 | 30 days supply | Qty: 30 | Fill #3

## 2019-10-28 MED FILL — GABAPENTIN 300 MG CAPSULE: 300 | 30 days supply | Qty: 120 | Fill #4

## 2019-11-05 ENCOUNTER — Other Ambulatory Visit: Payer: Self-pay | Admitting: Internal Medicine

## 2019-11-07 MED FILL — TRUEPLUS PEN NDL 32GX5/32: 32G X 4 MM | 50 days supply | Qty: 100 | Fill #0

## 2019-11-10 ENCOUNTER — Encounter: Payer: Self-pay | Admitting: Podiatry

## 2019-11-10 ENCOUNTER — Other Ambulatory Visit: Payer: Self-pay

## 2019-11-10 ENCOUNTER — Ambulatory Visit (INDEPENDENT_AMBULATORY_CARE_PROVIDER_SITE_OTHER): Payer: Medicare HMO | Admitting: Podiatry

## 2019-11-10 VITALS — Temp 97.2°F

## 2019-11-10 DIAGNOSIS — Q828 Other specified congenital malformations of skin: Secondary | ICD-10-CM | POA: Diagnosis not present

## 2019-11-10 DIAGNOSIS — B351 Tinea unguium: Secondary | ICD-10-CM

## 2019-11-10 DIAGNOSIS — E1142 Type 2 diabetes mellitus with diabetic polyneuropathy: Secondary | ICD-10-CM

## 2019-11-10 DIAGNOSIS — M79675 Pain in left toe(s): Secondary | ICD-10-CM

## 2019-11-10 DIAGNOSIS — L84 Corns and callosities: Secondary | ICD-10-CM | POA: Diagnosis not present

## 2019-11-10 DIAGNOSIS — M79674 Pain in right toe(s): Secondary | ICD-10-CM

## 2019-11-10 MED FILL — METAXALONE 800 MG TABLET: 800 | 30 days supply | Qty: 30 | Fill #1

## 2019-11-10 MED FILL — NOVOLOG MIX 70-30 FLEXPEN S: (70-30) 100 | 25 days supply | Qty: 3 | Fill #5

## 2019-11-10 MED FILL — ACCU-CHEK AVIVA PLUS STRP: 33 days supply | Qty: 100 | Fill #2

## 2019-11-10 NOTE — Progress Notes (Signed)
Subjective: Robert Sparks presents today for follow up of at risk foot care. Pt has h/o NIDDM with chronic kidney disease, callus(es) submet head 1 b/l. Aggravating factors include weightbearing with and without shoe gear. Pain is relieved with periodic professional debridement. and painful porokeratotic lesion(s) plantar right hallux and painful mycotic toenails b/l that limit ambulation. Aggravating factors include weightbearing with and without shoe gear. Pain for both is relieved with periodic professional debridement.   Robert Sparks states part of his right great toenail came off. "It just fell off." He notes no pain, redness, bleeding or drainage is associated with the digit.  Allergies  Allergen Reactions  . Other Other (See Comments)    Nicoderm CQ = Bad Dreams Nicotine gum = Bad Dreams     Objective: Vitals:   11/10/19 0956  Temp: (!) 97.2 F (36.2 C)    Pt is a pleasant 74 y.o. year old male  in NAD. AAO x 3.   Vascular Examination:  Capillary refill time to digits immediate b/l. Palpable DP pulses b/l. Palpable PT pulses b/l. Pedal hair absent b/l Skin temperature gradient within normal limits b/l. No edema noted b/l. No pain with calf compression b/l.  Dermatological Examination: Pedal skin with normal turgor, texture and tone bilaterally. No open wounds bilaterally. No interdigital macerations bilaterally. Toenails 1-5 b/l elongated, dystrophic, thickened, crumbly with subungual debris and tenderness to dorsal palpation. Hyperkeratotic lesion(s) submet head 1 left foot and submet head 1 right foot.  No erythema, no edema, no drainage, no flocculence. Porokeratotic lesion(s) R hallux. No erythema, no edema, no drainage, no flocculence.  Musculoskeletal: Normal muscle strength 5/5 to all lower extremity muscle groups bilaterally. No pain crepitus or joint limitation noted with ROM b/l. Hallux valgus with bunion deformity noted b/l.  Neurological: Protective sensation  diminished with 10g monofilament b/l.  Assessment: 1. Pain due to onychomycosis of toenails of both feet   2. Callus   3. Porokeratosis   4. Diabetic peripheral neuropathy associated with type 2 diabetes mellitus ([place])    Plan: -No new findings. No new orders. -Continue diabetic foot care principles. Literature dispensed on today.  -Toenails 1-5 b/l were debrided in length and girth with sterile nail nippers and dremel without iatrogenic bleeding.  -Callus(es) submet head 1 left foot and submet head 1 right foot pared utilizing sterile scalpel blade without complication or incident. Total number debrided =2. -Painful porokeratotic lesion(s) R hallux pared and enucleated with sterile scalpel blade without incident. -Patient to continue soft, supportive shoe gear daily. -Patient to report any pedal injuries to medical professional immediately. -Patient/POA to call should there be question/concern in the interim.  Return in about 3 months (around 02/10/2020) for diabetic nail and callus trim.  Marzetta Board, DPM

## 2019-11-10 NOTE — Patient Instructions (Signed)
Diabetes Mellitus and Foot Care Foot care is an important part of your health, especially when you have diabetes. Diabetes may cause you to have problems because of poor blood flow (circulation) to your feet and legs, which can cause your skin to:  Become thinner and drier.  Break more easily.  Heal more slowly.  Peel and crack. You may also have nerve damage (neuropathy) in your legs and feet, causing decreased feeling in them. This means that you may not notice minor injuries to your feet that could lead to more serious problems. Noticing and addressing any potential problems early is the best way to prevent future foot problems. How to care for your feet Foot hygiene  Wash your feet daily with warm water and mild soap. Do not use hot water. Then, pat your feet and the areas between your toes until they are completely dry. Do not soak your feet as this can dry your skin.  Trim your toenails straight across. Do not dig under them or around the cuticle. File the edges of your nails with an emery board or nail file.  Apply a moisturizing lotion or petroleum jelly to the skin on your feet and to dry, brittle toenails. Use lotion that does not contain alcohol and is unscented. Do not apply lotion between your toes. Shoes and socks  Wear clean socks or stockings every day. Make sure they are not too tight. Do not wear knee-high stockings since they may decrease blood flow to your legs.  Wear shoes that fit properly and have enough cushioning. Always look in your shoes before you put them on to be sure there are no objects inside.  To break in new shoes, wear them for just a few hours a day. This prevents injuries on your feet. Wounds, scrapes, corns, and calluses  Check your feet daily for blisters, cuts, bruises, sores, and redness. If you cannot see the bottom of your feet, use a mirror or ask someone for help.  Do not cut corns or calluses or try to remove them with medicine.  If you  find a minor scrape, cut, or break in the skin on your feet, keep it and the skin around it clean and dry. You may clean these areas with mild soap and water. Do not clean the area with peroxide, alcohol, or iodine.  If you have a wound, scrape, corn, or callus on your foot, look at it several times a day to make sure it is healing and not infected. Check for: ? Redness, swelling, or pain. ? Fluid or blood. ? Warmth. ? Pus or a bad smell. General instructions  Do not cross your legs. This may decrease blood flow to your feet.  Do not use heating pads or hot water bottles on your feet. They may burn your skin. If you have lost feeling in your feet or legs, you may not know this is happening until it is too late.  Protect your feet from hot and cold by wearing shoes, such as at the beach or on hot pavement.  Schedule a complete foot exam at least once a year (annually) or more often if you have foot problems. If you have foot problems, report any cuts, sores, or bruises to your health care provider immediately. Contact a health care provider if:  You have a medical condition that increases your risk of infection and you have any cuts, sores, or bruises on your feet.  You have an injury that is not   healing.  You have redness on your legs or feet.  You feel burning or tingling in your legs or feet.  You have pain or cramps in your legs and feet.  Your legs or feet are numb.  Your feet always feel cold.  You have pain around a toenail. Get help right away if:  You have a wound, scrape, corn, or callus on your foot and: ? You have pain, swelling, or redness that gets worse. ? You have fluid or blood coming from the wound, scrape, corn, or callus. ? Your wound, scrape, corn, or callus feels warm to the touch. ? You have pus or a bad smell coming from the wound, scrape, corn, or callus. ? You have a fever. ? You have a red line going up your leg. Summary  Check your feet every day  for cuts, sores, red spots, swelling, and blisters.  Moisturize feet and legs daily.  Wear shoes that fit properly and have enough cushioning.  If you have foot problems, report any cuts, sores, or bruises to your health care provider immediately.  Schedule a complete foot exam at least once a year (annually) or more often if you have foot problems. This information is not intended to replace advice given to you by your health care provider. Make sure you discuss any questions you have with your health care provider. Document Revised: 03/19/2019 Document Reviewed: 07/28/2016 [employer] Patient Education  2020 [employer]  Corns and Calluses Corns are small areas of thickened skin that occur on the top, sides, or tip of a toe. They contain a cone-shaped core with a point that can press on a nerve below. This causes pain.  Calluses are areas of thickened skin that can occur anywhere on the body, including the hands, fingers, palms, soles of the feet, and heels. Calluses are usually larger than corns. What are the causes? Corns and calluses are caused by rubbing (friction) or pressure, such as from shoes that are too tight or do not fit properly. What increases the risk? Corns are more likely to develop in people who have misshapen toes (toe deformities), such as hammer toes. Calluses can occur with friction to any area of the skin. They are more likely to develop in people who:  Work with their hands.  Wear shoes that fit poorly, are too tight, or are high-heeled.  Have toe deformities. What are the signs or symptoms? Symptoms of a corn or callus include:  A hard growth on the skin.  Pain or tenderness under the skin.  Redness and swelling.  Increased discomfort while wearing tight-fitting shoes, if your feet are affected. If a corn or callus becomes infected, symptoms may include:  Redness and swelling that gets worse.  Pain.  Fluid, blood, or pus draining from the corn or  callus. How is this diagnosed? Corns and calluses may be diagnosed based on your symptoms, your medical history, and a physical exam. How is this treated? Treatment for corns and calluses may include:  Removing the cause of the friction or pressure. This may involve: ? Changing your shoes. ? Wearing shoe inserts (orthotics) or other protective layers in your shoes, such as a corn pad. ? Wearing gloves.  Applying medicine to the skin (topical medicine) to help soften skin in the hardened, thickened areas.  Removing layers of dead skin with a file to reduce the size of the corn or callus.  Removing the corn or callus with a scalpel or laser.  Taking   antibiotic medicines, if your corn or callus is infected.  Having surgery, if a toe deformity is the cause. Follow these instructions at home:   Take over-the-counter and prescription medicines only as told by your health care provider.  If you were prescribed an antibiotic, take it as told by your health care provider. Do not stop taking it even if your condition starts to improve.  Wear shoes that fit well. Avoid wearing high-heeled shoes and shoes that are too tight or too loose.  Wear any padding, protective layers, gloves, or orthotics as told by your health care provider.  Soak your hands or feet and then use a file or pumice stone to soften your corn or callus. Do this as told by your health care provider.  Check your corn or callus every day for symptoms of infection. Contact a health care provider if you:  Notice that your symptoms do not improve with treatment.  Have redness or swelling that gets worse.  Notice that your corn or callus becomes painful.  Have fluid, blood, or pus coming from your corn or callus.  Have new symptoms. Summary  Corns are small areas of thickened skin that occur on the top, sides, or tip of a toe.  Calluses are areas of thickened skin that can occur anywhere on the body, including the  hands, fingers, palms, and soles of the feet. Calluses are usually larger than corns.  Corns and calluses are caused by rubbing (friction) or pressure, such as from shoes that are too tight or do not fit properly.  Treatment may include wearing any padding, protective layers, gloves, or orthotics as told by your health care provider. This information is not intended to replace advice given to you by your health care provider. Make sure you discuss any questions you have with your health care provider. Document Revised: 10/16/2018 Document Reviewed: 05/09/2017 [employer] Patient Education  2020 [employer]  Peripheral Neuropathy Peripheral neuropathy is a type of nerve damage. It affects nerves that carry signals between the spinal cord and the arms, legs, and the rest of the body (peripheral nerves). It does not affect nerves in the spinal cord or brain. In peripheral neuropathy, one nerve or a group of nerves may be damaged. Peripheral neuropathy is a broad category that includes many specific nerve disorders, like diabetic neuropathy, hereditary neuropathy, and carpal tunnel syndrome. What are the causes? This condition may be caused by:  Diabetes. This is the most common cause of peripheral neuropathy.  Nerve injury.  Pressure or stress on a nerve that lasts a long time.  Lack (deficiency) of B vitamins. This can result from alcoholism, poor diet, or a restricted diet.  Infections.  Autoimmune diseases, such as rheumatoid arthritis and systemic lupus erythematosus.  Nerve diseases that are passed from parent to child (inherited).  Some medicines, such as cancer medicines (chemotherapy).  Poisonous (toxic) substances, such as lead and mercury.  Too little blood flowing to the legs.  Kidney disease.  Thyroid disease. In some cases, the cause of this condition is not known. What are the signs or symptoms? Symptoms of this condition depend on which of your nerves is damaged.  Common symptoms include:  Loss of feeling (numbness) in the feet, hands, or both.  Tingling in the feet, hands, or both.  Burning pain.  Very sensitive skin.  Weakness.  Not being able to move a part of the body (paralysis).  Muscle twitching.  Clumsiness or poor coordination.  Loss of balance.    Not being able to control your bladder.  Feeling dizzy.  Sexual problems. How is this diagnosed? Diagnosing and finding the cause of peripheral neuropathy can be difficult. Your health care provider will take your medical history and do a physical exam. A neurological exam will also be done. This involves checking things that are affected by your brain, spinal cord, and nerves (nervous system). For example, your health care provider will check your reflexes, how you move, and what you can feel. You may have other tests, such as:  Blood tests.  Electromyogram (EMG) and nerve conduction tests. These tests check nerve function and how well the nerves are controlling the muscles.  Imaging tests, such as CT scans or MRI to rule out other causes of your symptoms.  Removing a small piece of nerve to be examined in a lab (nerve biopsy). This is rare.  Removing and examining a small amount of the fluid that surrounds the brain and spinal cord (lumbar puncture). This is rare. How is this treated? Treatment for this condition may involve:  Treating the underlying cause of the neuropathy, such as diabetes, kidney disease, or vitamin deficiencies.  Stopping medicines that can cause neuropathy, such as chemotherapy.  Medicine to relieve pain. Medicines may include: ? Prescription or over-the-counter pain medicine. ? Antiseizure medicine. ? Antidepressants. ? Pain-relieving patches that are applied to painful areas of skin.  Surgery to relieve pressure on a nerve or to destroy a nerve that is causing pain.  Physical therapy to help improve movement and balance.  Devices to help you  move around (assistive devices). Follow these instructions at home: Medicines  Take over-the-counter and prescription medicines only as told by your health care provider. Do not take any other medicines without first asking your health care provider.  Do not drive or use heavy machinery while taking prescription pain medicine. Lifestyle   Do not use any products that contain nicotine or tobacco, such as cigarettes and e-cigarettes. Smoking keeps blood from reaching damaged nerves. If you need help quitting, ask your health care provider.  Avoid or limit alcohol. Too much alcohol can cause a vitamin B deficiency, and vitamin B is needed for healthy nerves.  Eat a healthy diet. This includes: ? Eating foods that are high in fiber, such as fresh fruits and vegetables, whole grains, and beans. ? Limiting foods that are high in fat and processed sugars, such as fried or sweet foods. General instructions   If you have diabetes, work closely with your health care provider to keep your blood sugar under control.  If you have numbness in your feet: ? Check every day for signs of injury or infection. Watch for redness, warmth, and swelling. ? Wear padded socks and comfortable shoes. These help protect your feet.  Develop a good support system. Living with peripheral neuropathy can be stressful. Consider talking with a mental health specialist or joining a support group.  Use assistive devices and attend physical therapy as told by your health care provider. This may include using a walker or a cane.  Keep all follow-up visits as told by your health care provider. This is important. Contact a health care provider if:  You have new signs or symptoms of peripheral neuropathy.  You are struggling emotionally from dealing with peripheral neuropathy.  Your pain is not well-controlled. Get help right away if:  You have an injury or infection that is not healing normally.  You develop new  weakness in an arm or leg.    You fall frequently. Summary  Peripheral neuropathy is when the nerves in the arms, or legs are damaged, resulting in numbness, weakness, or pain.  There are many causes of peripheral neuropathy, including diabetes, pinched nerves, vitamin deficiencies, autoimmune disease, and hereditary conditions.  Diagnosing and finding the cause of peripheral neuropathy can be difficult. Your health care provider will take your medical history, do a physical exam, and do tests, including blood tests and nerve function tests.  Treatment involves treating the underlying cause of the neuropathy and taking medicines to help control pain. Physical therapy and assistive devices may also help. This information is not intended to replace advice given to you by your health care provider. Make sure you discuss any questions you have with your health care provider. Document Revised: 06/08/2017 Document Reviewed: 09/04/2016 [employer] Patient Education  2020 [employer]  

## 2019-11-11 ENCOUNTER — Other Ambulatory Visit: Payer: Self-pay | Admitting: Internal Medicine

## 2019-11-12 MED FILL — PRAVASTATIN SODIUM 20 MG TA: 20 | 30 days supply | Qty: 30 | Fill #0

## 2019-11-17 ENCOUNTER — Inpatient Hospital Stay: Payer: Medicare HMO | Attending: Hematology

## 2019-11-17 ENCOUNTER — Inpatient Hospital Stay (HOSPITAL_BASED_OUTPATIENT_CLINIC_OR_DEPARTMENT_OTHER): Payer: Medicare HMO | Admitting: Hematology

## 2019-11-17 ENCOUNTER — Other Ambulatory Visit: Payer: Self-pay

## 2019-11-17 VITALS — BP 130/66 | HR 81 | Temp 98.2°F | Resp 18 | Ht 65.0 in | Wt 165.3 lb

## 2019-11-17 DIAGNOSIS — C9001 Multiple myeloma in remission: Secondary | ICD-10-CM | POA: Insufficient documentation

## 2019-11-17 DIAGNOSIS — G629 Polyneuropathy, unspecified: Secondary | ICD-10-CM | POA: Diagnosis not present

## 2019-11-17 LAB — CMP (CANCER CENTER ONLY)
ALT: 21 U/L (ref 0–44)
AST: 23 U/L (ref 15–41)
Albumin: 3.8 g/dL (ref 3.5–5.0)
Alkaline Phosphatase: 70 U/L (ref 38–126)
Anion gap: 9 (ref 5–15)
BUN: 17 mg/dL (ref 8–23)
CO2: 24 mmol/L (ref 22–32)
Calcium: 9.1 mg/dL (ref 8.9–10.3)
Chloride: 109 mmol/L (ref 98–111)
Creatinine: 1.59 mg/dL — ABNORMAL HIGH (ref 0.61–1.24)
GFR, Est AFR Am: 50 mL/min — ABNORMAL LOW (ref >60)
GFR, Estimated: 43 mL/min — ABNORMAL LOW (ref >60)
Glucose, Bld: 141 mg/dL — ABNORMAL HIGH (ref 70–99)
Potassium: 3.8 mmol/L (ref 3.5–5.1)
Sodium: 142 mmol/L (ref 135–145)
Total Bilirubin: 0.4 mg/dL (ref 0.3–1.2)
Total Protein: 6.8 g/dL (ref 6.5–8.1)

## 2019-11-17 LAB — CBC WITH DIFFERENTIAL/PLATELET
Abs Immature Granulocytes: 0.01 10*3/uL (ref 0.00–0.07)
Basophils Absolute: 0.1 10*3/uL (ref 0.0–0.1)
Basophils Relative: 1 %
Eosinophils Absolute: 1.4 10*3/uL — ABNORMAL HIGH (ref 0.0–0.5)
Eosinophils Relative: 22 %
HCT: 32.1 % — ABNORMAL LOW (ref 39.0–52.0)
Hemoglobin: 10.8 g/dL — ABNORMAL LOW (ref 13.0–17.0)
Immature Granulocytes: 0 %
Lymphocytes Relative: 26 %
Lymphs Abs: 1.6 10*3/uL (ref 0.7–4.0)
MCH: 34.7 pg — ABNORMAL HIGH (ref 26.0–34.0)
MCHC: 33.6 g/dL (ref 30.0–36.0)
MCV: 103.2 fL — ABNORMAL HIGH (ref 80.0–100.0)
Monocytes Absolute: 0.4 10*3/uL (ref 0.1–1.0)
Monocytes Relative: 6 %
Neutro Abs: 2.7 10*3/uL (ref 1.7–7.7)
Neutrophils Relative %: 45 %
Platelets: 207 10*3/uL (ref 150–400)
RBC: 3.11 MIL/uL — ABNORMAL LOW (ref 4.22–5.81)
RDW: 13.2 % (ref 11.5–15.5)
WBC: 6.1 10*3/uL (ref 4.0–10.5)
nRBC: 0 % (ref 0.0–0.2)

## 2019-11-17 NOTE — Progress Notes (Signed)
HEMATOLOGY/ONCOLOGY CLINIC NOTE  Date of Service: 11/17/2019  Patient Care Team: Robert Pier, MD as PCP - General (Internal Medicine) Robert Sax, MD (Inactive) as Consulting Physician (Hematology and Oncology)  CHIEF COMPLAINTS/PURPOSE OF CONSULTATION:  F/u for continued mx of Multiple Myeloma  Oncologic History:  74 y.o. male with diagnosis of IgA lambda active multiple myeloma, ISS Stage I. Active disease diagnosed based on presence of anemia, kidney insufficiency, and paraproteinemia with significant predominance of lambda light chains as well as significant elevation of IgA. Bone marrow biopsy confirmed presence of atypical monoclonal plasma cell process in the bone marrow comprising at least 7% of the cellularity. Based on the findings, patient was started on treatment with lenalidomide, bortezomib, and low-dose dexamethasone based on the anticipated tolerance by the patient. Lenalidomide was started at 10 mg daily based on creatinine clearance of 42, dexamethasone dose was reduced to 20 mg weekly based on patient's age. Treatment was complicated by rapid development of cutaneous rash attributed to lenalidomide, inaddition to decreased renal function and now patient has persistent severe anemia. Side-effects were attributed to Lenalidomide and lenalidomide was discontinued with subsequent recovery.  Patient has been receiving bortezomib weekly with low-dose dexamethasone in 4-day cycles.  Patient has completed induction systemic therapy for his disease with repeat bone marrow biopsy confirming complete response including no evidence of minimal residual disease by cytogenetics or FISH.  HISTORY OF PRESENTING ILLNESS:   Robert Sparks is a wonderful 74 y.o. male who has been referred to [place] by my colleague Dr Robert Sparks for evaluation and management of Multiple Myeloma. The pt reports that he is doing well overall.   The pt has been previously followed by my colleague  Dr Robert Sparks. The pt was treated with Revlimid, Velcade, and dexamethasone and was subsequently switched to Velcade and Dexamethasone after a Revlimid intolerance.   The pt reports some knee aches and skin soreness in his legs which is worse at night. He notes that he has had diabetic-related peripheral neuropathy. The pt has not seen [facility] yet for a discussion of a BM transplant. He continues taking Acyclovir and notes no difficulty taking maintenance Velcade.   He adds that he takes Gabapentin and sweats.  He notes that his neuropathy is currently stable and denies it worsening.   He notes that a pack of cigarettes lasts him for about 3 days.   Most recent lab results (12/14/17) of CBC and CMP  is as follows: all values are WNL except for RBC at 3.08, HGB at 10.1, HCT at 31.0, MCV at 100.6, Eosinophils Abs at 1.4k, Potassium at 3.4, Glucose at 156, Creatinine at 1.60. LDH 12/14/17 is 141 Beta 2 microglobulin 12/14/17 is 2.4  On review of systems, pt reports knee pain, night time LE skin soreness, peripheral neuropathy, eating well, strong appetite, intermittent sweating and denies fevers, chills, back pains, abdominal pains, and any other symptoms.   [name] DM type 2, HTN, CKD. On Social Hx the pt reports smoking a pack of cigarettes every 3 days.   [name] returns today regarding management and evaluation of his Multiple Myeloma. [name] was on 08/18/2019. The pt reports that he is doing well overall.  The pt reports that he has recevied his COVID19 vaccines. His neuropathy has been stable since our last visit and is not limiting his activities at this time. Pt does have sweats at night,  but notes that he takes Gabapentin directly before bed. Pt has been walking at least 30 minutes per day.   Lab results today (11/17/19) of CBC w/diff and CMP is as follows: all values are WNL except for RBC at 3.11, Hgb at 10.8, HCT  at 32.1, MCV at 103.2, MCH at 34.7, Eos Abs at 1.4K, Glucose at 141, Creatinine at 1.59, GFR Est Afr Am at 50. 11/17/2019 MMP - shows no M spike  On review of systems, pt reports night sweats, neuropathy and denies bone pain, fevers, chills, low appetite, unexpected weight loss and any other symptoms.   MEDICAL HISTORY:  Past Medical History:  Diagnosis Date  . Anemia   . Arthritis    shoulders,feet   . Cataract    per eye dr -has appt 5-11  . CKD (chronic kidney disease), stage III   . Elevated PSA   . History of ketoacidosis    03-29-2015   . History of sepsis    07-25-2013  non-compliant w/ medication  . Hyperlipidemia   . Hypertension   . Nocturia   . Peripheral neuropathy   . Prostate cancer ([place])   . Type 2 diabetes mellitus with insulin therapy ([place])     SURGICAL HISTORY: Past Surgical History:  Procedure Laterality Date  . CIRCUMCISION  1990's  . PROSTATE BIOPSY N/A 12/21/2015   Procedure: BIOPSY TRANSRECTAL ULTRASONIC PROSTATE (TUBP);  Surgeon: Festus Aloe, MD;  Location: [facility];  Service: Urology;  Laterality: N/A;    SOCIAL HISTORY: Social History   Socioeconomic History  . Marital status: Single    Spouse name: Not on file  . Number of children: Not on file  . Years of education: Not on file  . Highest education level: Not on file  Occupational History  . Not on file  Tobacco Use  . Smoking status: Current Every Day Smoker    Packs/day: 0.50    Years: 50.00    Pack years: 25.00    Types: Cigarettes  . Smokeless tobacco: Never Used  . Tobacco comment: 1 ppwk-4-5 a day   Substance and Sexual Activity  . Alcohol use: Yes    Alcohol/week: 1.0 standard drinks    Types: 1 Cans of beer per week    Comment: 1 every day-quit couple weeks ago  . Drug use: No  . Sexual activity: Not Currently  Other Topics Concern  . Not on file  Social History Narrative  . Not on file   Social Determinants of Health   Financial Resource  Strain:   . Difficulty of Paying Living Expenses:   Food Insecurity:   . Worried About Charity fundraiser in the Last Year:   . Arboriculturist in the Last Year:   Transportation Needs:   . Film/video editor (Medical):   [name] Lack of Transportation (Non-Medical):   Physical Activity:   . Days of Exercise per Week:   . Minutes of Exercise per Session:   Stress:   . Feeling of Stress :   Social Connections:   . Frequency of Communication with Friends and Family:   . Frequency of Social Gatherings with Friends and Family:   . Attends Religious Services:   . Active Member of Clubs or Organizations:   . Attends Archivist Meetings:   [name] Marital Status:   Intimate Partner Violence:   . Fear of Current or Ex-Partner:   . Emotionally Abused:   [name] Physically Abused:   .  Sexually Abused:     FAMILY HISTORY: Family History  Problem Relation Age of Onset  . Kidney disease Mother   . Cancer Neg Hx   . Colon cancer Neg Hx   . Colon polyps Neg Hx   . Esophageal cancer Neg Hx   . Rectal cancer Neg Hx   . Stomach cancer Neg Hx     ALLERGIES:  is allergic to other.  MEDICATIONS:  Current Outpatient Medications  Medication Sig Dispense Refill  . ACCU-CHEK AVIVA PLUS test strip USE AS DIRECTED 100 strip 12  . acyclovir (ZOVIRAX) 400 MG tablet Take 1 tablet (400 mg total) by mouth 2 (two) times daily. 60 tablet 5  . amLODipine (NORVASC) 10 MG tablet Take 1 tablet (10 mg total) by mouth daily. 30 tablet 6  . aspirin EC 81 MG tablet Take 1 tablet (81 mg total) by mouth daily. 90 tablet 3  . b complex vitamins capsule Take 1 capsule by mouth daily. 30 capsule 5  . buPROPion (WELLBUTRIN SR) 150 MG 12 hr tablet     . diclofenac Sodium (VOLTAREN) 1 % GEL Apply 4 g topically 4 (four) times daily. 150 g 5  . donepezil (ARICEPT) 5 MG tablet Take 1 tablet (5 mg total) by mouth at bedtime. For memory issues 30 tablet 2  . DULoxetine (CYMBALTA) 30 MG capsule Take 1 capsule (30 mg total)  by mouth daily. X 1 week then 60 mg daily- for nerve pain/back pain 60 capsule 5  . ferrous sulfate 325 (65 FE) MG tablet Take 325 mg by mouth daily.    [name] gabapentin (NEURONTIN) 300 MG capsule TAKE 1 CAPSULE BY MOUTH IN THE MORNING AND LUNCH, AND 2 AT BEDTIME 120 capsule 6  . hydrOXYzine (ATARAX/VISTARIL) 25 MG tablet Take 1 tablet (25 mg total) by mouth 3 (three) times daily as needed for itching (rash). 30 tablet 0  . insulin aspart protamine - aspart (NOVOLOG 70/30 MIX) (70-30) 100 UNIT/ML FlexPen Inject 0.06 mLs (6 Units total) into the skin 2 (two) times daily with a meal. 15 mL 6  . ixazomib citrate (NINLARO) 3 MG capsule Take 1 capsule (3 mg) by mouth weekly, 3 weeks on, 1 week off, repeat every 4 weeks. Take on an empty stomach 1hr before or 2hr after meals. (Patient not taking: Reported on 07/29/2019) 3 capsule 3  . lidocaine (LIDODERM) 5 %     . metaxalone (SKELAXIN) 800 MG tablet TAKE 1 TABLET (800 MG TOTAL) BY MOUTH AT BEDTIME AS NEEDED FOR MUSCLE SPASMS. 30 tablet 3  . Multiple Vitamin (MULTIVITAMIN WITH MINERALS) TABS tablet Take 1 tablet by mouth daily. 60 tablet 2  . pravastatin (PRAVACHOL) 20 MG tablet TAKE 1 TABLET BY MOUTH DAILY. 30 tablet 0  . TRUEPLUS PEN NEEDLES 32G X 4 MM MISC USE AS DIRECTED TWICE DAILY WITH INSULIN 100 each 0  . vitamin B-12 (CYANOCOBALAMIN) 100 MCG tablet Take 100 mcg by mouth daily.    . Vitamins/Minerals TABS Take by mouth.     No current facility-administered medications for this visit.    REVIEW OF SYSTEMS:   A 10+ POINT REVIEW OF SYSTEMS WAS OBTAINED including neurology, dermatology, psychiatry, cardiac, respiratory, lymph, extremities, GI, GU, Musculoskeletal, constitutional, breasts, reproductive, HEENT.  All pertinent positives are noted in the HPI.  All others are negative.   PHYSICAL EXAMINATION: ECOG PERFORMANCE STATUS: 1 - Symptomatic but completely ambulatory Exam was given in a chair  .BP 130/66 (BP Location: Left Arm, Patient Position:  Sitting)   Pulse 81   Temp 98.2 F (36.8 C) (Temporal)   Resp 18   Ht '5\' 5"'  (1.651 m)   Wt 165 lb 4.8 oz (75 kg)   SpO2 100%   BMI 27.51 kg/m    GENERAL:alert, in no acute distress and comfortable SKIN: no acute rashes, no significant lesions EYES: conjunctiva are pink and non-injected, sclera anicteric OROPHARYNX: MMM, no exudates, no oropharyngeal erythema or ulceration NECK: supple, no JVD LYMPH:  no palpable lymphadenopathy in the cervical, axillary or inguinal regions LUNGS: clear to auscultation b/l with normal respiratory effort HEART: regular rate & rhythm ABDOMEN:  normoactive bowel sounds , non tender, not distended. No palpable hepatosplenomegaly.  Extremity: no pedal edema PSYCH: alert & oriented x 3 with fluent speech NEURO: no focal motor/sensory deficits  LABORATORY DATA:  I have reviewed the data as listed  . CBC Latest Ref Rng & Units 11/17/2019 08/18/2019 06/17/2019  WBC 4.0 - 10.5 K/uL 6.1 5.0 6.5  Hemoglobin 13.0 - 17.0 g/dL 10.8(L) 10.6(L) 10.7(L)  Hematocrit 39.0 - 52.0 % 32.1(L) 31.6(L) 31.6(L)  Platelets 150 - 400 K/uL 207 154 192    . CMP Latest Ref Rng & Units 11/17/2019 08/18/2019 06/17/2019  Glucose 70 - 99 mg/dL 141(H) 179(H) 128(H)  BUN 8 - 23 mg/dL 17 24(H) 21  Creatinine 0.61 - 1.24 mg/dL 1.59(H) 1.73(H) 1.57(H)  Sodium 135 - 145 mmol/L 142 141 138  Potassium 3.5 - 5.1 mmol/L 3.8 3.8 3.9  Chloride 98 - 111 mmol/L 109 108 105  CO2 22 - 32 mmol/L '24 23 23  ' Calcium 8.9 - 10.3 mg/dL 9.1 9.1 9.2  Total Protein 6.5 - 8.1 g/dL 6.8 6.4(L) 7.1  Total Bilirubin 0.3 - 1.2 mg/dL 0.4 0.4 0.5  Alkaline Phos 38 - 126 U/L 70 96 90  AST 15 - 41 U/L 23 17 40  ALT 0 - 44 U/L 21 19 40   08/29/17 BM Bx:    08/29/17 Cytogenetics:   03/13/17 Cytogenetics:          RADIOGRAPHIC STUDIES: I have personally reviewed the radiological images as listed and agreed with the findings in the report. No results found.  ASSESSMENT & PLAN:   73 y.o. male with     1. IgA Lambda Multiple Myeloma ISS Stage I -Currently in remission  Active disease was previously diagnosed based on presence of anemia, kidney insufficiency, and paraproteinemia with significant predominance of lambda light chains as well as significant elevation of IgA.  PLAN:  -Discussed pt labwork today, 11/17/19; all values are WNL except for RBC at 3.11, Hgb at 10.8, HCT at 32.1, MCV at 103.2, MCH at 34.7, Eos Abs at 1.4K, Glucose at 141, Creatinine at 1.59, GFR Est Afr Am at 50. -Discussed 11/17/2019 K/L light chains is in progress -Discussed 11/17/2019 MMP is in progress, 08/18/2019 MMP shows no M Protein observed -No lab or clinical indication of Multiple Myeloma return or progression at this time -Will continue to hold maintenance treatment due to significant side effects (rash/neuropathy) -Recommended that the pt continue to eat well, drink at least 48-64 oz of water each day, and walk 20-30 minutes each day.  -Will see back in 3 months with labs   FOLLOW UP: RTC with Dr Irene Limbo with labs in 3 months.   The total time spent in the appt was 20 minutes and more than 50% was on counseling and direct patient cares.  All of the patient's questions were answered with apparent satisfaction. The  patient knows to call the clinic with any problems, questions or concerns.   Sullivan Lone MD [place] AAHIVMS [facility] Hematology/Oncology Physician [facility]  (Office):       [phone] (Work cell):  [phone] (Fax):           [phone]  11/17/2019 3:47 PM  I, Yevette Edwards, am acting as a scribe for Dr. Sullivan Lone.   .I have reviewed the above documentation for accuracy and completeness, and I agree with the above. Brunetta Genera MD

## 2019-11-18 ENCOUNTER — Telehealth: Payer: Self-pay | Admitting: Hematology

## 2019-11-18 LAB — KAPPA/LAMBDA LIGHT CHAINS
Kappa free light chain: 53.5 mg/L — ABNORMAL HIGH (ref 3.3–19.4)
Kappa, lambda light chain ratio: 1.8 — ABNORMAL HIGH (ref 0.26–1.65)
Lambda free light chains: 29.7 mg/L — ABNORMAL HIGH (ref 5.7–26.3)

## 2019-11-18 NOTE — Telephone Encounter (Signed)
Scheduled per 05/10 los, patient has been called and notified.

## 2019-11-19 LAB — MULTIPLE MYELOMA PANEL, SERUM
Albumin SerPl Elph-Mcnc: 3.8 g/dL (ref 2.9–4.4)
Albumin/Glob SerPl: 1.6 (ref 0.7–1.7)
Alpha 1: 0.2 g/dL (ref 0.0–0.4)
Alpha2 Glob SerPl Elph-Mcnc: 0.7 g/dL (ref 0.4–1.0)
B-Globulin SerPl Elph-Mcnc: 0.8 g/dL (ref 0.7–1.3)
Gamma Glob SerPl Elph-Mcnc: 0.8 g/dL (ref 0.4–1.8)
Globulin, Total: 2.5 g/dL (ref 2.2–3.9)
IgA: 142 mg/dL (ref 61–437)
IgG (Immunoglobin G), Serum: 807 mg/dL (ref 603–1613)
IgM (Immunoglobulin M), Srm: 77 mg/dL (ref 15–143)
Total Protein ELP: 6.3 g/dL (ref 6.0–8.5)

## 2019-11-24 MED FILL — AMLODIPINE BESYLATE 10 MG T: 10 | 30 days supply | Qty: 30 | Fill #4

## 2019-12-01 ENCOUNTER — Other Ambulatory Visit: Payer: Self-pay

## 2019-12-01 ENCOUNTER — Ambulatory Visit: Payer: Medicare HMO | Attending: Internal Medicine | Admitting: Internal Medicine

## 2019-12-01 ENCOUNTER — Encounter: Payer: Self-pay | Admitting: Internal Medicine

## 2019-12-01 VITALS — BP 134/76 | HR 69 | Temp 97.9°F | Resp 16 | Wt 165.6 lb

## 2019-12-01 DIAGNOSIS — E1169 Type 2 diabetes mellitus with other specified complication: Secondary | ICD-10-CM | POA: Diagnosis not present

## 2019-12-01 DIAGNOSIS — E1122 Type 2 diabetes mellitus with diabetic chronic kidney disease: Secondary | ICD-10-CM

## 2019-12-01 DIAGNOSIS — Z794 Long term (current) use of insulin: Secondary | ICD-10-CM

## 2019-12-01 DIAGNOSIS — N183 Chronic kidney disease, stage 3 unspecified: Secondary | ICD-10-CM | POA: Diagnosis not present

## 2019-12-01 DIAGNOSIS — I10 Essential (primary) hypertension: Secondary | ICD-10-CM

## 2019-12-01 DIAGNOSIS — C9001 Multiple myeloma in remission: Secondary | ICD-10-CM

## 2019-12-01 DIAGNOSIS — F17200 Nicotine dependence, unspecified, uncomplicated: Secondary | ICD-10-CM

## 2019-12-01 DIAGNOSIS — E785 Hyperlipidemia, unspecified: Secondary | ICD-10-CM

## 2019-12-01 DIAGNOSIS — N1831 Chronic kidney disease, stage 3a: Secondary | ICD-10-CM

## 2019-12-01 LAB — GLUCOSE, POCT (MANUAL RESULT ENTRY)
POC Glucose: 75 mg/dl (ref 70–99)
POC Glucose: 172 mg/dl — AB (ref 70–99)

## 2019-12-01 LAB — POCT GLYCOSYLATED HEMOGLOBIN (HGB A1C): HbA1c, POC (controlled diabetic range): 6.7 % (ref 0.0–7.0)

## 2019-12-01 MED FILL — NOVOLOG MIX 70-30 FLEXPEN S: (70-30) 100 | 25 days supply | Qty: 3 | Fill #6

## 2019-12-01 NOTE — Patient Instructions (Addendum)
I have submitted a referral for you to see the eye doctor.  You can purchase the nicotine patches over-the-counter when you are ready to give a trial of quitting.

## 2019-12-01 NOTE — Progress Notes (Signed)
Patient ID: Robert Sparks, male    DOB: February 04, 1946  MRN: [id number]  CC: Diabetes and Hypertension   Subjective: Robert Sparks is a 74 y.o. male who presents for chronic ds management His concerns today include:  Patient with history of HTN,DM2with polyneuropathy,HL,CKD stage3, tob dependence, EtOH abuse, prostate CA,OA knee,vit B 12def, spinal stenosis, and multiple myelomain remission, memory changes MMSE 04/2019).     HTN:  Compliant with meds. "It's hard trying to limit salt in foods." No CP/SOB/LE edema  HL:  Taking and tolerating Pravachol  CKD 3: GFR stable.  Last saw neph Q 6 mths.  Still makes good urine.  Tob Dep:  Smokes 1/3 pk a day. States he needs to make his mind up to quit.  States that he will consider purchasing some nicotine patches over-the-counter  DIABETES TYPE 2 Last A1C:   Results for orders placed or performed in visit on 12/01/19  POCT glucose (manual entry)  Result Value Ref Range   POC Glucose 75 70 - 99 mg/dl  POCT glycosylated hemoglobin (Hb A1C)  Result Value Ref Range   Hemoglobin A1C     HbA1c POC (<> result, manual entry)     HbA1c, POC (prediabetic range)     HbA1c, POC (controlled diabetic range) 6.7 0.0 - 7.0 %    Med Adherence:  '[x]'  Yes    '[]'  No Medication side effects:  '[]'  Yes    '[x]'  No Home Monitoring?  '[x]'  Yes  1-3x/day.  Does not have log with him Home glucose results range: 130s Diet Adherence:  Tries to eat healthy but "its hard.  I eat too much." Exercise: '[x]'  Yes - walks daily to a store near his house.  Takes about 5 min to buy lottery tickets Hypoglycemic episodes?: '[]'  Yes    '[x]'  No - no recent lows before visit today Numbness of the feet? '[x]'  Yes - in the toes Retinopathy hx? '[]'  Yes    '[]'  No Last eye exam: over due for eye exam Comments:   Multiple myeloma: He saw his oncologist earlier this month.  He is in remission.  He has a mild stable anemia Patient Active Problem List   Diagnosis Date Noted  .  Microalbuminuria 04/27/2019  . Memory changes 04/25/2019  . Hyperlipidemia associated with type 2 diabetes mellitus ([place]) 04/25/2019  . Dementia associated with other underlying disease without behavioral disturbance ([place]) 03/14/2019  . Chronic bilateral low back pain without sciatica 03/14/2019  . Spinal stenosis, lumbar region with neurogenic claudication 03/14/2019  . Myofascial muscle pain 03/14/2019  . History of prostate cancer 12/20/2018  . Neuropathy of right hand 12/20/2018  . HCAP (healthcare-associated pneumonia) 05/02/2018  . Cough   . Primary osteoarthritis of right knee 01/07/2018  . Diabetic polyneuropathy associated with type 2 diabetes mellitus ([place]) 01/07/2018  . Vitamin B12 deficiency 09/04/2017  . Pre-ulcerative corn or callous 04/12/2017  . Cataract of both eyes 04/12/2017  . Tobacco dependence 04/12/2017  . Hypokalemia 03/23/2017  . Multiple myeloma in remission ([place]) 02/23/2017  . Oral leukoplakia 02/09/2017  . Lightheadedness 11/10/2016  . Hyperlipidemia 06/21/2016  . CKD (chronic kidney disease) stage 3, GFR 30-59 ml/min 02/23/2016  . Malignant neoplasm of prostate ([place]) 02/21/2016  . Controlled type 2 diabetes mellitus with stage 3 chronic kidney disease, with long-term current use of insulin ([place]) 03/29/2015  . HTN (hypertension) 07/28/2013  . Anemia, chronic disease 07/26/2013  . Sepsis ([place]) 07/25/2013  . ETOH abuse 07/25/2013  Current Outpatient Medications on File Prior to Visit  Medication Sig Dispense Refill  . ACCU-CHEK AVIVA PLUS test strip USE AS DIRECTED 100 strip 12  . acyclovir (ZOVIRAX) 400 MG tablet Take 1 tablet (400 mg total) by mouth 2 (two) times daily. 60 tablet 5  . amLODipine (NORVASC) 10 MG tablet Take 1 tablet (10 mg total) by mouth daily. 30 tablet 6  . aspirin EC 81 MG tablet Take 1 tablet (81 mg total) by mouth daily. 90 tablet 3  . b complex vitamins capsule Take 1 capsule by mouth daily. 30 capsule 5  . buPROPion  (WELLBUTRIN SR) 150 MG 12 hr tablet     . diclofenac Sodium (VOLTAREN) 1 % GEL Apply 4 g topically 4 (four) times daily. 150 g 5  . donepezil (ARICEPT) 5 MG tablet Take 1 tablet (5 mg total) by mouth at bedtime. For memory issues 30 tablet 2  . DULoxetine (CYMBALTA) 30 MG capsule Take 1 capsule (30 mg total) by mouth daily. X 1 week then 60 mg daily- for nerve pain/back pain 60 capsule 5  . ferrous sulfate 325 (65 FE) MG tablet Take 325 mg by mouth daily.    [name] gabapentin (NEURONTIN) 300 MG capsule TAKE 1 CAPSULE BY MOUTH IN THE MORNING AND LUNCH, AND 2 AT BEDTIME 120 capsule 6  . hydrOXYzine (ATARAX/VISTARIL) 25 MG tablet Take 1 tablet (25 mg total) by mouth 3 (three) times daily as needed for itching (rash). 30 tablet 0  . insulin aspart protamine - aspart (NOVOLOG 70/30 MIX) (70-30) 100 UNIT/ML FlexPen Inject 0.06 mLs (6 Units total) into the skin 2 (two) times daily with a meal. 15 mL 6  . ixazomib citrate (NINLARO) 3 MG capsule Take 1 capsule (3 mg) by mouth weekly, 3 weeks on, 1 week off, repeat every 4 weeks. Take on an empty stomach 1hr before or 2hr after meals. (Patient not taking: Reported on 07/29/2019) 3 capsule 3  . lidocaine (LIDODERM) 5 %     . metaxalone (SKELAXIN) 800 MG tablet TAKE 1 TABLET (800 MG TOTAL) BY MOUTH AT BEDTIME AS NEEDED FOR MUSCLE SPASMS. 30 tablet 3  . Multiple Vitamin (MULTIVITAMIN WITH MINERALS) TABS tablet Take 1 tablet by mouth daily. 60 tablet 2  . pravastatin (PRAVACHOL) 20 MG tablet TAKE 1 TABLET BY MOUTH DAILY. 30 tablet 0  . TRUEPLUS PEN NEEDLES 32G X 4 MM MISC USE AS DIRECTED TWICE DAILY WITH INSULIN 100 each 0  . vitamin B-12 (CYANOCOBALAMIN) 100 MCG tablet Take 100 mcg by mouth daily.    . Vitamins/Minerals TABS Take by mouth.     No current facility-administered medications on file prior to visit.    Allergies  Allergen Reactions  . Other Other (See Comments)    Nicoderm CQ = Bad Dreams Nicotine gum = Bad Dreams     Social History    Socioeconomic History  . Marital status: Single    Spouse name: Not on file  . Number of children: Not on file  . Years of education: Not on file  . Highest education level: Not on file  Occupational History  . Not on file  Tobacco Use  . Smoking status: Current Every Day Smoker    Packs/day: 0.50    Years: 50.00    Pack years: 25.00    Types: Cigarettes  . Smokeless tobacco: Never Used  . Tobacco comment: 1 ppwk-4-5 a day   Substance and Sexual Activity  . Alcohol use: Yes  Alcohol/week: 1.0 standard drinks    Types: 1 Cans of beer per week    Comment: 1 every day-quit couple weeks ago  . Drug use: No  . Sexual activity: Not Currently  Other Topics Concern  . Not on file  Social History Narrative  . Not on file   Social Determinants of Health   Financial Resource Strain:   . Difficulty of Paying Living Expenses:   Food Insecurity:   . Worried About Charity fundraiser in the Last Year:   . Arboriculturist in the Last Year:   Transportation Needs:   . Film/video editor (Medical):   [name] Lack of Transportation (Non-Medical):   Physical Activity:   . Days of Exercise per Week:   . Minutes of Exercise per Session:   Stress:   . Feeling of Stress :   Social Connections:   . Frequency of Communication with Friends and Family:   . Frequency of Social Gatherings with Friends and Family:   . Attends Religious Services:   . Active Member of Clubs or Organizations:   . Attends Archivist Meetings:   [name] Marital Status:   Intimate Partner Violence:   . Fear of Current or Ex-Partner:   . Emotionally Abused:   [name] Physically Abused:   . Sexually Abused:     Family History  Problem Relation Age of Onset  . Kidney disease Mother   . Cancer Neg Hx   . Colon cancer Neg Hx   . Colon polyps Neg Hx   . Esophageal cancer Neg Hx   . Rectal cancer Neg Hx   . Stomach cancer Neg Hx     Past Surgical History:  Procedure Laterality Date  . CIRCUMCISION  1990's   . PROSTATE BIOPSY N/A 12/21/2015   Procedure: BIOPSY TRANSRECTAL ULTRASONIC PROSTATE (TUBP);  Surgeon: Festus Aloe, MD;  Location: [facility];  Service: Urology;  Laterality: N/A;    ROS: Review of Systems Negative except as stated above  PHYSICAL EXAM: BP 134/76   Pulse 69   Temp 97.9 F (36.6 C)   Resp 16   Wt 165 lb 9.6 oz (75.1 kg)   SpO2 98%   BMI 27.56 kg/m   Physical Exam  General appearance - alert, well appearing, pleasant elderly male and in no distress Mental status - normal mood, behavior, speech, dress, motor activity, and thought processes Eyes - pupils equal and reactive, extraocular eye movements intact Mouth - mucous membranes moist, pharynx normal without lesions Neck - supple, no significant adenopathy Chest - clear to auscultation, no wheezes, rales or rhonchi, symmetric air entry Heart - normal rate, regular rhythm, normal S1, S2, no murmurs, rubs, clicks or gallops Extremities -no lower extremity edema CMP Latest Ref Rng & Units 11/17/2019 08/18/2019 06/17/2019  Glucose 70 - 99 mg/dL 141(H) 179(H) 128(H)  BUN 8 - 23 mg/dL 17 24(H) 21  Creatinine 0.61 - 1.24 mg/dL 1.59(H) 1.73(H) 1.57(H)  Sodium 135 - 145 mmol/L 142 141 138  Potassium 3.5 - 5.1 mmol/L 3.8 3.8 3.9  Chloride 98 - 111 mmol/L 109 108 105  CO2 22 - 32 mmol/L '24 23 23  ' Calcium 8.9 - 10.3 mg/dL 9.1 9.1 9.2  Total Protein 6.5 - 8.1 g/dL 6.8 6.4(L) 7.1  Total Bilirubin 0.3 - 1.2 mg/dL 0.4 0.4 0.5  Alkaline Phos 38 - 126 U/L 70 96 90  AST 15 - 41 U/L 23 17 40  ALT 0 - 44 U/L  21 19 40   Lipid Panel     Component Value Date/Time   CHOL 184 04/25/2019 0939   TRIG 114 04/25/2019 0939   HDL 88 04/25/2019 0939   CHOLHDL 2.1 04/25/2019 0939   CHOLHDL 3.6 06/21/2016 1047   VLDL 50 (H) 06/21/2016 1047   LDLCALC 76 04/25/2019 0939    CBC    Component Value Date/Time   WBC 6.1 11/17/2019 1338   RBC 3.11 (L) 11/17/2019 1338   HGB 10.8 (L) 11/17/2019 1338   HGB 10.6 (L)  08/18/2019 1338   HGB 12.2 (L) 12/06/2017 0920   HGB 10.1 (L) 07/13/2017 0938   HCT 32.1 (L) 11/17/2019 1338   HCT 37.1 (L) 12/06/2017 0920   HCT 30.6 (L) 07/13/2017 0938   PLT 207 11/17/2019 1338   PLT 154 08/18/2019 1338   PLT 250 12/06/2017 0920   MCV 103.2 (H) 11/17/2019 1338   MCV 98 (H) 12/06/2017 0920   MCV 96.2 07/13/2017 0938   MCH 34.7 (H) 11/17/2019 1338   MCHC 33.6 11/17/2019 1338   RDW 13.2 11/17/2019 1338   RDW 14.1 12/06/2017 0920   RDW 14.4 07/13/2017 0938   LYMPHSABS 1.6 11/17/2019 1338   LYMPHSABS 0.8 (L) 07/13/2017 0938   MONOABS 0.4 11/17/2019 1338   MONOABS 0.4 07/13/2017 0938   EOSABS 1.4 (H) 11/17/2019 1338   EOSABS 0.5 07/13/2017 0938   EOSABS 0.8 (H) 11/21/2016 1416   BASOSABS 0.1 11/17/2019 1338   BASOSABS 0.0 07/13/2017 0938    ASSESSMENT AND PLAN:  1. Controlled type 2 diabetes mellitus with stage 3 chronic kidney disease, with long-term current use of insulin (HCC) Controlled.  Continue current dose of insulin. Encouraged him to be mindful of his portion sizes.  Continues to remain active - POCT glucose (manual entry) - POCT glycosylated hemoglobin (Hb A1C) - Ambulatory referral to Ophthalmology - POCT glucose (manual entry)  2. Essential hypertension Close to goal.  Continue current medication which is amlodipine  3. Hyperlipidemia associated with type 2 diabetes mellitus (HCC) Continue Pravachol  4. Tobacco dependence Continue to advise smoking cessation.  He states that once he fully makes up his mind to quit he will purchase the nicotine patches.  Less than 5 minutes spent on counseling  5. Stage 3a chronic kidney disease Stable.  He thinks he has an appointment coming up with his nephrologist  6. Multiple myeloma in remission [facility])  Patient was given the opportunity to ask questions.  Patient verbalized understanding of the plan and was able to repeat key elements of the plan.   Orders Placed This Encounter  Procedures  .  Ambulatory referral to Ophthalmology  . POCT glucose (manual entry)  . POCT glycosylated hemoglobin (Hb A1C)     Requested Prescriptions    No prescriptions requested or ordered in this encounter    No follow-ups on file.  Karle Plumber, MD, FACP

## 2019-12-03 DIAGNOSIS — Z8546 Personal history of malignant neoplasm of prostate: Secondary | ICD-10-CM | POA: Diagnosis not present

## 2019-12-03 MED FILL — GABAPENTIN 300 MG CAPSULE: 300 | 30 days supply | Qty: 120 | Fill #5

## 2019-12-09 ENCOUNTER — Other Ambulatory Visit: Payer: Self-pay | Admitting: Hematology

## 2019-12-09 ENCOUNTER — Other Ambulatory Visit: Payer: Self-pay | Admitting: Internal Medicine

## 2019-12-09 ENCOUNTER — Other Ambulatory Visit: Payer: Self-pay

## 2019-12-09 DIAGNOSIS — C9000 Multiple myeloma not having achieved remission: Secondary | ICD-10-CM

## 2019-12-09 MED ORDER — ACCU-CHEK AVIVA PLUS VI STRP: ORAL_STRIP | 12 refills | Status: DC

## 2019-12-09 NOTE — Telephone Encounter (Signed)
Patient request refill

## 2019-12-10 ENCOUNTER — Other Ambulatory Visit: Payer: Self-pay | Admitting: Hematology

## 2019-12-10 DIAGNOSIS — Z8546 Personal history of malignant neoplasm of prostate: Secondary | ICD-10-CM | POA: Diagnosis not present

## 2019-12-10 DIAGNOSIS — N5201 Erectile dysfunction due to arterial insufficiency: Secondary | ICD-10-CM | POA: Diagnosis not present

## 2019-12-10 MED FILL — ACYCLOVIR 400 MG TABS: 400 | 30 days supply | Qty: 60 | Fill #0

## 2019-12-12 MED FILL — PRAVASTATIN SODIUM 20 MG TA: 20 | 30 days supply | Qty: 30 | Fill #0

## 2019-12-22 ENCOUNTER — Telehealth: Payer: Self-pay

## 2019-12-22 DIAGNOSIS — Z794 Long term (current) use of insulin: Secondary | ICD-10-CM

## 2019-12-22 MED FILL — AMLODIPINE BESYLATE 10 MG T: 10 | 30 days supply | Qty: 30 | Fill #5

## 2019-12-22 NOTE — Telephone Encounter (Signed)
Pt's ins prefers one touch products now--can you send in the meter,test strips,lancets for one touch

## 2019-12-23 MED ORDER — ONETOUCH VERIO W/DEVICE KIT: PACK | 0 refills | Status: DC

## 2019-12-23 MED ORDER — ONETOUCH VERIO VI STRP: ORAL_STRIP | 2 refills | Status: DC

## 2019-12-23 MED ORDER — ONETOUCH DELICA LANCETS 33G MISC
2 refills | Status: DC
Start: 2019-12-23 — End: 2021-07-18

## 2019-12-23 MED FILL — ONE TOUCH VERIO TEST STRIP: 33 days supply | Qty: 100 | Fill #0

## 2019-12-23 MED FILL — ONETOUCH VERIO REFLECT W/DE: W/DEVICE | 1 days supply | Qty: 1 | Fill #0

## 2019-12-23 MED FILL — ONETOUCH DELICA PLUS LANCET: 33 days supply | Qty: 100 | Fill #0

## 2019-12-23 NOTE — Telephone Encounter (Signed)
Prescriptions sent

## 2019-12-23 NOTE — Addendum Note (Signed)
Addended by: Daisy Blossom, Annie Main L on: 12/23/2019 11:36 AM   Modules accepted: Orders

## 2019-12-29 MED FILL — NOVOLOG MIX 70-30 FLEXPEN S: (70-30) 100 | 25 days supply | Qty: 3 | Fill #7

## 2019-12-29 MED FILL — METAXALONE 800 MG TABLET: 800 | 30 days supply | Qty: 30 | Fill #2

## 2020-01-05 MED FILL — GABAPENTIN 300 MG CAPSULE: 300 | 30 days supply | Qty: 120 | Fill #6

## 2020-01-12 DIAGNOSIS — E785 Hyperlipidemia, unspecified: Secondary | ICD-10-CM | POA: Diagnosis not present

## 2020-01-12 DIAGNOSIS — E1142 Type 2 diabetes mellitus with diabetic polyneuropathy: Secondary | ICD-10-CM | POA: Diagnosis not present

## 2020-01-12 DIAGNOSIS — N529 Male erectile dysfunction, unspecified: Secondary | ICD-10-CM | POA: Diagnosis not present

## 2020-01-12 DIAGNOSIS — Z720 Tobacco use: Secondary | ICD-10-CM | POA: Diagnosis not present

## 2020-01-12 DIAGNOSIS — I10 Essential (primary) hypertension: Secondary | ICD-10-CM | POA: Diagnosis not present

## 2020-01-12 DIAGNOSIS — M1990 Unspecified osteoarthritis, unspecified site: Secondary | ICD-10-CM | POA: Diagnosis not present

## 2020-01-12 DIAGNOSIS — B009 Herpesviral infection, unspecified: Secondary | ICD-10-CM | POA: Diagnosis not present

## 2020-01-12 DIAGNOSIS — Z794 Long term (current) use of insulin: Secondary | ICD-10-CM | POA: Diagnosis not present

## 2020-01-12 DIAGNOSIS — C9001 Multiple myeloma in remission: Secondary | ICD-10-CM | POA: Diagnosis not present

## 2020-01-12 DIAGNOSIS — K5900 Constipation, unspecified: Secondary | ICD-10-CM | POA: Diagnosis not present

## 2020-01-13 ENCOUNTER — Other Ambulatory Visit: Payer: Self-pay | Admitting: Internal Medicine

## 2020-01-15 MED FILL — PRAVASTATIN SODIUM 20 MG TA: 20 | 90 days supply | Qty: 90 | Fill #0

## 2020-01-20 MED FILL — NOVOLOG MIX 70-30 FLEXPEN S: (70-30) 100 | 25 days supply | Qty: 3 | Fill #8

## 2020-01-21 MED FILL — AMLODIPINE BESYLATE 10 MG T: 10 | 30 days supply | Qty: 30 | Fill #6

## 2020-01-26 MED FILL — ONE TOUCH VERIO TEST STRIP: 33 days supply | Qty: 100 | Fill #1

## 2020-01-27 DIAGNOSIS — H10413 Chronic giant papillary conjunctivitis, bilateral: Secondary | ICD-10-CM | POA: Diagnosis not present

## 2020-01-27 DIAGNOSIS — H40013 Open angle with borderline findings, low risk, bilateral: Secondary | ICD-10-CM | POA: Diagnosis not present

## 2020-01-27 DIAGNOSIS — H25813 Combined forms of age-related cataract, bilateral: Secondary | ICD-10-CM | POA: Diagnosis not present

## 2020-01-27 DIAGNOSIS — E119 Type 2 diabetes mellitus without complications: Secondary | ICD-10-CM | POA: Diagnosis not present

## 2020-01-27 DIAGNOSIS — Z794 Long term (current) use of insulin: Secondary | ICD-10-CM | POA: Diagnosis not present

## 2020-01-27 LAB — HM DIABETES EYE EXAM

## 2020-02-09 ENCOUNTER — Encounter: Payer: Self-pay | Admitting: Podiatry

## 2020-02-09 ENCOUNTER — Ambulatory Visit (INDEPENDENT_AMBULATORY_CARE_PROVIDER_SITE_OTHER): Payer: Medicare HMO | Admitting: Podiatry

## 2020-02-09 ENCOUNTER — Other Ambulatory Visit: Payer: Self-pay

## 2020-02-09 DIAGNOSIS — L84 Corns and callosities: Secondary | ICD-10-CM

## 2020-02-09 DIAGNOSIS — M2011 Hallux valgus (acquired), right foot: Secondary | ICD-10-CM

## 2020-02-09 DIAGNOSIS — Q828 Other specified congenital malformations of skin: Secondary | ICD-10-CM

## 2020-02-09 DIAGNOSIS — E1142 Type 2 diabetes mellitus with diabetic polyneuropathy: Secondary | ICD-10-CM | POA: Diagnosis not present

## 2020-02-09 DIAGNOSIS — M79674 Pain in right toe(s): Secondary | ICD-10-CM

## 2020-02-09 DIAGNOSIS — B351 Tinea unguium: Secondary | ICD-10-CM

## 2020-02-09 DIAGNOSIS — M79675 Pain in left toe(s): Secondary | ICD-10-CM

## 2020-02-09 DIAGNOSIS — E119 Type 2 diabetes mellitus without complications: Secondary | ICD-10-CM

## 2020-02-09 NOTE — Patient Instructions (Signed)
Diabetic Neuropathy Diabetic neuropathy refers to nerve damage that is caused by diabetes (diabetes mellitus). Over time, people with diabetes can develop nerve damage throughout the body. There are several types of diabetic neuropathy:  Peripheral neuropathy. This is the most common type of diabetic neuropathy. It causes damage to nerves that carry signals between the spinal cord and other parts of the body (peripheral nerves). This usually affects nerves in the feet and legs first, and may eventually affect the hands and arms. The damage affects the ability to sense touch or temperature.  Autonomic neuropathy. This type causes damage to nerves that control involuntary functions (autonomic nerves). These nerves carry signals that control: ? Heartbeat. ? Body temperature. ? Blood pressure. ? Urination. ? Digestion. ? Sweating. ? Sexual function. ? Response to changing blood sugar (glucose) levels.  Focal neuropathy. This type of nerve damage affects one area of the body, such as an arm, a leg, or the face. The injury may involve one nerve or a small group of nerves. Focal neuropathy can be painful and unpredictable, and occurs most often in older adults with diabetes. This often develops suddenly, but usually improves over time and does not cause long-term problems.  Proximal neuropathy. This type of nerve damage affects the nerves of the thighs, hips, buttocks, or legs. It causes severe pain, weakness, and muscle death (atrophy), usually in the thigh muscles. It is more common among older men and people who have type 2 diabetes. The length of recovery time may vary. What are the causes? Peripheral, autonomic, and focal neuropathies are caused by diabetes that is not well controlled with treatment. The cause of proximal neuropathy is not known, but it may be caused by inflammation related to uncontrolled blood glucose levels. What are the signs or symptoms? Peripheral neuropathy Peripheral  neuropathy develops slowly over time. When the nerves of the feet and legs no longer work, you may experience:  Burning, stabbing, or aching pain in the legs or feet.  Pain or cramping in the legs or feet.  Loss of feeling (numbness) and inability to feel pressure or pain in the feet. This can lead to: ? Thick calluses or sores on areas of constant pressure. ? Ulcers. ? Reduced ability to feel temperature changes.  Foot deformities.  Muscle weakness.  Loss of balance or coordination. Autonomic neuropathy The symptoms of autonomic neuropathy vary depending on which nerves are affected. Symptoms may include:  Problems with digestion, such as: ? Nausea or vomiting. ? Poor appetite. ? Bloating. ? Diarrhea or constipation. ? Trouble swallowing. ? Losing weight without trying to.  Problems with the heart, blood and lungs, such as: ? Dizziness, especially when standing up. ? Fainting. ? Shortness of breath. ? Irregular heartbeat.  Bladder problems, such as: ? Trouble starting or stopping urination. ? Leaking urine. ? Trouble emptying the bladder. ? Urinary tract infections (UTIs).  Problems with other body functions, such as: ? Sweat. You may sweat too much or too little. ? Temperature. You might get hot easily. Or, you might feel cold more than usual. ? Sexual function. Men may not be able to get or maintain an erection. Women may have vaginal dryness and difficulty with arousal. Focal neuropathy Symptoms affect only one area of the body. Common symptoms include:  Numbness.  Tingling.  Burning pain.  Prickling feeling.  Very sensitive skin.  Weakness.  Inability to move (paralysis).  Muscle twitching.  Muscles getting smaller (wasting).  Poor coordination.  Double or blurred vision. Proximal   neuropathy  Sudden, severe pain in the hip, thigh, or buttocks. Pain may spread from the back into the legs (sciatica).  Pain and numbness in the arms and  legs.  Tingling.  Loss of bladder control or bowel control.  Weakness and wasting of thigh muscles.  Difficulty getting up from a seated position.  Abdominal swelling.  Unexplained weight loss. How is this diagnosed? Diagnosis usually involves reviewing your medical history and any symptoms you have. Diagnosis varies depending on the type of neuropathy your health care provider suspects. Peripheral neuropathy Your health care provider will check areas that are affected by your nervous system (neurologic exam), such as your reflexes, how you move, and what you can feel. You may have other tests, such as:  Blood tests.  Removal and examination of fluid that surrounds the spinal cord (lumbar puncture).  CT scan.  MRI.  A test to check the nerves that control muscles (electromyogram, EMG).  Tests of how quickly messages pass through your nerves (nerve conduction velocity tests).  Removal of a small piece of nerve to be examined under a microscope (biopsy). Autonomic neuropathy You may have tests, such as:  Tests to measure your blood pressure and heart rate. This may include monitoring you while you are safely secured to an exam table that moves you from a lying position to an upright position (table tilt test).  Breathing tests to check your lungs.  Tests to check how food moves through the digestive system (gastric emptying tests).  Blood, sweat, or urine tests.  Ultrasound of your bladder.  Spinal fluid tests. Focal neuropathy This condition may be diagnosed with:  A neurologic exam.  CT scan.  MRI.  EMG.  Nerve conduction velocity tests. Proximal neuropathy There is no test to diagnose this type of neuropathy. You may have tests to rule out other possible causes of this type of neuropathy. Tests may include:  X-rays of your spine and lumbar region.  Lumbar puncture.  MRI. How is this treated? The goal of treatment is to keep nerve damage from getting  worse. The most important part of treatment is keeping your blood glucose level and your A1C level within your target range by following your diabetes management plan. Over time, maintaining lower blood glucose levels helps lessen symptoms. In some cases, you may need prescription pain medicine. Follow these instructions at home:  Lifestyle   Do not use any products that contain nicotine or tobacco, such as cigarettes and e-cigarettes. If you need help quitting, ask your health care provider.  Be physically active every day. Include strength training and balance exercises.  Follow a healthy meal plan.  Work with your health care provider to manage your blood pressure. General instructions  Follow your diabetes management plan as directed. ? Check your blood glucose levels as directed by your health care provider. ? Keep your blood glucose in your target range as directed by your health care provider. ? Have your A1C level checked at least two times a year, or as often as told by your health care provider.  Take over the counter and prescription medicines only as told by your health care provider. This includes insulin and diabetes medicine.  Do not drive or use heavy machinery while taking prescription pain medicines.  Check your skin and feet every day for cuts, bruises, redness, blisters, or sores.  Keep all follow up visits as told by your health care provider. This is important. Contact a health care provider if:  You have burning, stabbing, or aching pain in your legs or feet.  You are unable to feel pressure or pain in your feet.  You develop problems with digestion, such as: ? Nausea. ? Vomiting. ? Bloating. ? Constipation. ? Diarrhea. ? Abdominal pain.  You have difficulty with urination, such as inability: ? To control when you urinate (incontinence). ? To completely empty the bladder (retention).  You have palpitations.  You feel dizzy, weak, or faint when you  stand up. Get help right away if:  You cannot urinate.  You have sudden weakness or loss of coordination.  You have trouble speaking.  You have pain or pressure in your chest.  You have an irregular heart beat.  You have sudden inability to move a part of your body. Summary  Diabetic neuropathy refers to nerve damage that is caused by diabetes. It can affect nerves throughout the entire body, causing numbness and pain in the arms, legs, digestive tract, heart, and other body systems.  Keep your blood glucose level and your blood pressure in your target range, as directed by your health care provider. This can help prevent neuropathy from getting worse.  Check your skin and feet every day for cuts, bruises, redness, blisters, or sores.  Do not use any products that contain nicotine or tobacco, such as cigarettes and e-cigarettes. If you need help quitting, ask your health care provider. This information is not intended to replace advice given to you by your health care provider. Make sure you discuss any questions you have with your health care provider. Document Revised: 08/08/2017 Document Reviewed: 07/31/2016 Elsevier Patient Education  [place] are small areas of thickened skin that occur on the top, sides, or tip of a toe. They contain a cone-shaped core with a point that can press on a nerve below. This causes pain.  Calluses are areas of thickened skin that can occur anywhere on the body, including the hands, fingers, palms, soles of the feet, and heels. Calluses are usually larger than corns. What are the causes? Corns and calluses are caused by rubbing (friction) or pressure, such as from shoes that are too tight or do not fit properly. What increases the risk? Corns are more likely to develop in people who have misshapen toes (toe deformities), such as hammer toes. Calluses can occur with friction to any area of the skin. They are more  likely to develop in people who:  Work with their hands.  Wear shoes that fit poorly, are too tight, or are high-heeled.  Have toe deformities. What are the signs or symptoms? Symptoms of a corn or callus include:  A hard growth on the skin.  Pain or tenderness under the skin.  Redness and swelling.  Increased discomfort while wearing tight-fitting shoes, if your feet are affected. If a corn or callus becomes infected, symptoms may include:  Redness and swelling that gets worse.  Pain.  Fluid, blood, or pus draining from the corn or callus. How is this diagnosed? Corns and calluses may be diagnosed based on your symptoms, your medical history, and a physical exam. How is this treated? Treatment for corns and calluses may include:  Removing the cause of the friction or pressure. This may involve: ? Changing your shoes. ? Wearing shoe inserts (orthotics) or other protective layers in your shoes, such as a corn pad. ? Wearing gloves.  Applying medicine to the skin (topical medicine) to help soften skin in  the hardened, thickened areas.  Removing layers of dead skin with a file to reduce the size of the corn or callus.  Removing the corn or callus with a scalpel or laser.  Taking antibiotic medicines, if your corn or callus is infected.  Having surgery, if a toe deformity is the cause. Follow these instructions at home:   Take over-the-counter and prescription medicines only as told by your health care provider.  If you were prescribed an antibiotic, take it as told by your health care provider. Do not stop taking it even if your condition starts to improve.  Wear shoes that fit well. Avoid wearing high-heeled shoes and shoes that are too tight or too loose.  Wear any padding, protective layers, gloves, or orthotics as told by your health care provider.  Soak your hands or feet and then use a file or pumice stone to soften your corn or callus. Do this as told by your  health care provider.  Check your corn or callus every day for symptoms of infection. Contact a health care provider if you:  Notice that your symptoms do not improve with treatment.  Have redness or swelling that gets worse.  Notice that your corn or callus becomes painful.  Have fluid, blood, or pus coming from your corn or callus.  Have new symptoms. Summary  Corns are small areas of thickened skin that occur on the top, sides, or tip of a toe.  Calluses are areas of thickened skin that can occur anywhere on the body, including the hands, fingers, palms, and soles of the feet. Calluses are usually larger than corns.  Corns and calluses are caused by rubbing (friction) or pressure, such as from shoes that are too tight or do not fit properly.  Treatment may include wearing any padding, protective layers, gloves, or orthotics as told by your health care provider. This information is not intended to replace advice given to you by your health care provider. Make sure you discuss any questions you have with your health care provider. Document Revised: 10/16/2018 Document Reviewed: 05/09/2017 Elsevier Patient Education  2020 [employer].

## 2020-02-09 NOTE — Progress Notes (Signed)
Subjective: Osias Resnick Brigante presents today for for diabetic foot evaluation, for annual diabetic foot examination and painful porokeratotic lesion(s) b/l feet and painful mycotic toenails that limit ambulation. Painful toenails interfere with ambulation. Aggravating factors include wearing enclosed shoe gear. Pain is relieved with periodic professional debridement. Painful porokeratotic lesions are aggravated when weightbearing with and without shoegear. Pain is relieved with periodic professional debridement.Wynetta Emery, Dalbert Batman, MD is patient's PCP. Last visit   Past Medical History:  Diagnosis Date  . Anemia   . Arthritis    shoulders,feet   . Cataract    per eye dr -has appt 5-11  . CKD (chronic kidney disease), stage III   . Elevated PSA   . History of ketoacidosis    03-29-2015   . History of sepsis    07-25-2013  non-compliant w/ medication  . Hyperlipidemia   . Hypertension   . Nocturia   . Peripheral neuropathy   . Prostate cancer ([place])   . Type 2 diabetes mellitus with insulin therapy [facility])     Patient Active Problem List   Diagnosis Date Noted  . Microalbuminuria 04/27/2019  . Memory changes 04/25/2019  . Hyperlipidemia associated with type 2 diabetes mellitus ([place]) 04/25/2019  . Dementia associated with other underlying disease without behavioral disturbance ([place]) 03/14/2019  . Chronic bilateral low back pain without sciatica 03/14/2019  . Spinal stenosis, lumbar region with neurogenic claudication 03/14/2019  . Myofascial muscle pain 03/14/2019  . History of prostate cancer 12/20/2018  . Neuropathy of right hand 12/20/2018  . HCAP (healthcare-associated pneumonia) 05/02/2018  . Cough   . Primary osteoarthritis of right knee 01/07/2018  . Diabetic polyneuropathy associated with type 2 diabetes mellitus ([place]) 01/07/2018  . Vitamin B12 deficiency 09/04/2017  . Pre-ulcerative corn or callous 04/12/2017  . Cataract of both eyes 04/12/2017  . Tobacco dependence  04/12/2017  . Hypokalemia 03/23/2017  . Multiple myeloma in remission ([place]) 02/23/2017  . Oral leukoplakia 02/09/2017  . Lightheadedness 11/10/2016  . Hyperlipidemia 06/21/2016  . CKD (chronic kidney disease) stage 3, GFR 30-59 ml/min 02/23/2016  . Malignant neoplasm of prostate ([place]) 02/21/2016  . Controlled type 2 diabetes mellitus with stage 3 chronic kidney disease, with long-term current use of insulin ([place]) 03/29/2015  . HTN (hypertension) 07/28/2013  . Anemia, chronic disease 07/26/2013  . Sepsis ([place]) 07/25/2013  . ETOH abuse 07/25/2013    Past Surgical History:  Procedure Laterality Date  . CIRCUMCISION  1990's  . PROSTATE BIOPSY N/A 12/21/2015   Procedure: BIOPSY TRANSRECTAL ULTRASONIC PROSTATE (TUBP);  Surgeon: Festus Aloe, MD;  Location: [facility];  Service: Urology;  Laterality: N/A;    Current Outpatient Medications on File Prior to Visit  Medication Sig Dispense Refill  . acyclovir (ZOVIRAX) 400 MG tablet TAKE 1 TABLET (400 MG TOTAL) BY MOUTH 2 (TWO) TIMES DAILY. 60 tablet 5  . amLODipine (NORVASC) 10 MG tablet Take 1 tablet (10 mg total) by mouth daily. 30 tablet 6  . aspirin EC 81 MG tablet Take 1 tablet (81 mg total) by mouth daily. 90 tablet 3  . b complex vitamins capsule Take 1 capsule by mouth daily. 30 capsule 5  . Blood Glucose Monitoring Suppl (ONETOUCH VERIO) w/Device KIT Use to test blood sugar TID. E11.2 1 kit 0  . buPROPion (WELLBUTRIN SR) 150 MG 12 hr tablet     . diclofenac Sodium (VOLTAREN) 1 % GEL Apply 4 g topically 4 (four) times daily. 150 g 5  . donepezil (ARICEPT)  5 MG tablet Take 1 tablet (5 mg total) by mouth at bedtime. For memory issues 30 tablet 2  . DULoxetine (CYMBALTA) 30 MG capsule Take 1 capsule (30 mg total) by mouth daily. X 1 week then 60 mg daily- for nerve pain/back pain 60 capsule 5  . ferrous sulfate 325 (65 FE) MG tablet Take 325 mg by mouth daily.    [name] gabapentin (NEURONTIN) 300 MG capsule TAKE 1 CAPSULE  BY MOUTH IN THE MORNING AND LUNCH, AND 2 AT BEDTIME 120 capsule 6  . glucose blood (ONETOUCH VERIO) test strip Use to test blood sugar TID. E11.2 100 each 2  . hydrOXYzine (ATARAX/VISTARIL) 25 MG tablet Take 1 tablet (25 mg total) by mouth 3 (three) times daily as needed for itching (rash). 30 tablet 0  . insulin aspart protamine - aspart (NOVOLOG 70/30 MIX) (70-30) 100 UNIT/ML FlexPen Inject 0.06 mLs (6 Units total) into the skin 2 (two) times daily with a meal. 15 mL 6  . ixazomib citrate (NINLARO) 3 MG capsule Take 1 capsule (3 mg) by mouth weekly, 3 weeks on, 1 week off, repeat every 4 weeks. Take on an empty stomach 1hr before or 2hr after meals. (Patient not taking: Reported on 07/29/2019) 3 capsule 3  . lidocaine (LIDODERM) 5 %     . metaxalone (SKELAXIN) 800 MG tablet TAKE 1 TABLET (800 MG TOTAL) BY MOUTH AT BEDTIME AS NEEDED FOR MUSCLE SPASMS. 30 tablet 3  . Multiple Vitamin (MULTIVITAMIN WITH MINERALS) TABS tablet Take 1 tablet by mouth daily. 60 tablet 2  . OneTouch Delica Lancets 94R MISC Use to test blood sugar TID. E11.2 100 each 2  . pravastatin (PRAVACHOL) 20 MG tablet TAKE 1 TABLET BY MOUTH DAILY. 90 tablet 1  . tadalafil (CIALIS) 20 MG tablet Take 20 mg by mouth as needed.    . TRUEPLUS PEN NEEDLES 32G X 4 MM MISC USE AS DIRECTED TWICE DAILY WITH INSULIN 100 each 0  . vitamin B-12 (CYANOCOBALAMIN) 100 MCG tablet Take 100 mcg by mouth daily.    . Vitamins/Minerals TABS Take by mouth.     No current facility-administered medications on file prior to visit.     Allergies  Allergen Reactions  . Other Other (See Comments)    Nicoderm CQ = Bad Dreams Nicotine gum = Bad Dreams     Social History   Occupational History  . Not on file  Tobacco Use  . Smoking status: Current Every Day Smoker    Packs/day: 0.50    Years: 50.00    Pack years: 25.00    Types: Cigarettes  . Smokeless tobacco: Never Used  . Tobacco comment: 1 ppwk-4-5 a day   Vaping Use  . Vaping Use: Never  used  Substance and Sexual Activity  . Alcohol use: Yes    Alcohol/week: 1.0 standard drink    Types: 1 Cans of beer per week    Comment: 1 every day-quit couple weeks ago  . Drug use: No  . Sexual activity: Not Currently    Family History  Problem Relation Age of Onset  . Kidney disease Mother   . Cancer Neg Hx   . Colon cancer Neg Hx   . Colon polyps Neg Hx   . Esophageal cancer Neg Hx   . Rectal cancer Neg Hx   . Stomach cancer Neg Hx     Immunization History  Administered Date(s) Administered  . Influenza, High Dose Seasonal PF 04/08/2018  . Influenza,inj,Quad PF,6+ Mos 02/23/2016  .  PFIZER SARS-COV-2 Vaccination 09/05/2019, 10/01/2019  . Pneumococcal Conjugate-13 06/20/2018  . Pneumococcal Polysaccharide-23 02/23/2016  . Tdap 06/21/2016     Objective: There were no vitals filed for this visit.  Dann Galicia Steinbach is a pleasant 74 y.o. African-American male well-developed, well nourished in NAD. AAO X 3.  Vascular Examination: Capillary refill time to digits immediate b/l. Palpable pedal pulses b/l LE. Pedal hair absent. Lower extremity skin temperature gradient within normal limits. No pain with calf compression b/l. No edema noted b/l lower extremities.  Dermatological Examination: Pedal skin with normal turgor, texture and tone bilaterally. No open wounds bilaterally. No interdigital macerations bilaterally. Toenails 1-5 b/l elongated, discolored, dystrophic, thickened, crumbly with subungual debris and tenderness to dorsal palpation. Hyperkeratotic lesion(s) submet head 1 right foot.  No erythema, no edema, no drainage, no flocculence. Porokeratotic lesion(s) R hallux. No erythema, no edema, no drainage, no flocculence.  Musculoskeletal Examination: Normal muscle strength 5/5 to all lower extremity muscle groups bilaterally. No pain crepitus or joint limitation noted with ROM b/l. Hallux valgus with bunion deformity noted b/l lower extremities.  Footwear  Assessment: Does the patient wear appropriate shoes? Yes. Does the patient need inserts/orthotics? Yes.  Neurological Examination: Protective sensation diminished with 10g monofilament b/l. Vibratory sensation decreased b/l. Proprioception intact bilaterally.  Hemoglobin A1C Latest Ref Rng & Units 12/01/2019 04/25/2019  HGBA1C 0.0 - 7.0 % 6.7 6.2  Some recent data might be hidden     Assessment: 1. Pain due to onychomycosis of toenails of both feet   2. Callus   3. Porokeratosis   4. Diabetic peripheral neuropathy associated with type 2 diabetes mellitus (HCC)   5. Hallux valgus, acquired, bilateral   6. Encounter for diabetic foot exam ([place])     Risk Categorization:  High Risk  Patient has one or more of the following: Loss of protective sensation Absent pedal pulses Severe Foot deformity History of foot ulcer  Plan: -Examined patient. -Diabetic foot examination performed on today's visit. -Toenails 1-5 b/l were debrided in length and girth with sterile nail nippers and dremel without iatrogenic bleeding.  -Callus(es) submet head 1 right foot pared utilizing sterile scalpel blade without complication or incident. Total number debrided =1. -Painful porokeratotic lesion(s) R hallux pared and enucleated with sterile scalpel blade without incident. -Patient to report any pedal injuries to medical professional immediately. -Patient to continue soft, supportive shoe gear daily. Start procedure for diabetic shoes. Patient qualifies based on diagnoses. -Dispensed foam toe cap. Apply every morning. Remove every evening. -Patient to continue soft, supportive shoe gear daily. -Patient/POA to call should there be question/concern in the interim.  Return in about 3 months (around 05/11/2020) for diabetic nail and callus trim.  Marzetta Board, DPM

## 2020-02-10 ENCOUNTER — Other Ambulatory Visit: Payer: Self-pay | Admitting: Internal Medicine

## 2020-02-10 MED FILL — ACYCLOVIR 400 MG TABLET: 400 | 30 days supply | Qty: 60 | Fill #1

## 2020-02-11 MED FILL — GABAPENTIN 300 MG CAPSULE: 300 | 30 days supply | Qty: 120 | Fill #0

## 2020-02-12 MED FILL — NOVOLOG MIX 70-30 FLEXPEN S: (70-30) 100 | 25 days supply | Qty: 3 | Fill #9

## 2020-02-12 MED FILL — METAXALONE 800 MG TABLET: 800 | 30 days supply | Qty: 30 | Fill #3

## 2020-02-18 ENCOUNTER — Inpatient Hospital Stay (HOSPITAL_BASED_OUTPATIENT_CLINIC_OR_DEPARTMENT_OTHER): Payer: Medicare HMO | Admitting: Hematology

## 2020-02-18 ENCOUNTER — Other Ambulatory Visit: Payer: Self-pay

## 2020-02-18 ENCOUNTER — Inpatient Hospital Stay: Payer: Medicare HMO | Attending: Hematology

## 2020-02-18 ENCOUNTER — Other Ambulatory Visit: Payer: Self-pay | Admitting: Internal Medicine

## 2020-02-18 VITALS — BP 130/78 | HR 73 | Temp 97.4°F | Resp 20 | Ht 65.0 in | Wt 158.0 lb

## 2020-02-18 DIAGNOSIS — E1140 Type 2 diabetes mellitus with diabetic neuropathy, unspecified: Secondary | ICD-10-CM | POA: Diagnosis not present

## 2020-02-18 DIAGNOSIS — Z8546 Personal history of malignant neoplasm of prostate: Secondary | ICD-10-CM | POA: Insufficient documentation

## 2020-02-18 DIAGNOSIS — I129 Hypertensive chronic kidney disease with stage 1 through stage 4 chronic kidney disease, or unspecified chronic kidney disease: Secondary | ICD-10-CM | POA: Insufficient documentation

## 2020-02-18 DIAGNOSIS — G629 Polyneuropathy, unspecified: Secondary | ICD-10-CM | POA: Diagnosis not present

## 2020-02-18 DIAGNOSIS — N189 Chronic kidney disease, unspecified: Secondary | ICD-10-CM | POA: Insufficient documentation

## 2020-02-18 DIAGNOSIS — F17210 Nicotine dependence, cigarettes, uncomplicated: Secondary | ICD-10-CM | POA: Diagnosis not present

## 2020-02-18 DIAGNOSIS — R69 Illness, unspecified: Secondary | ICD-10-CM | POA: Diagnosis not present

## 2020-02-18 DIAGNOSIS — C9001 Multiple myeloma in remission: Secondary | ICD-10-CM | POA: Diagnosis not present

## 2020-02-18 LAB — CBC WITH DIFFERENTIAL/PLATELET
Abs Immature Granulocytes: 0.01 10*3/uL (ref 0.00–0.07)
Basophils Absolute: 0.1 10*3/uL (ref 0.0–0.1)
Basophils Relative: 1 %
Eosinophils Absolute: 1.6 10*3/uL — ABNORMAL HIGH (ref 0.0–0.5)
Eosinophils Relative: 21 %
HCT: 33.6 % — ABNORMAL LOW (ref 39.0–52.0)
Hemoglobin: 11.5 g/dL — ABNORMAL LOW (ref 13.0–17.0)
Immature Granulocytes: 0 %
Lymphocytes Relative: 26 %
Lymphs Abs: 2 10*3/uL (ref 0.7–4.0)
MCH: 34.1 pg — ABNORMAL HIGH (ref 26.0–34.0)
MCHC: 34.2 g/dL (ref 30.0–36.0)
MCV: 99.7 fL (ref 80.0–100.0)
Monocytes Absolute: 0.4 10*3/uL (ref 0.1–1.0)
Monocytes Relative: 5 %
Neutro Abs: 3.7 10*3/uL (ref 1.7–7.7)
Neutrophils Relative %: 47 %
Platelets: 205 10*3/uL (ref 150–400)
RBC: 3.37 MIL/uL — ABNORMAL LOW (ref 4.22–5.81)
RDW: 12.5 % (ref 11.5–15.5)
WBC: 7.7 10*3/uL (ref 4.0–10.5)
nRBC: 0 % (ref 0.0–0.2)

## 2020-02-18 LAB — VITAMIN D 25 HYDROXY (VIT D DEFICIENCY, FRACTURES): Vit D, 25-Hydroxy: 24.52 ng/mL — ABNORMAL LOW (ref 30–100)

## 2020-02-18 NOTE — Progress Notes (Signed)
HEMATOLOGY/ONCOLOGY CLINIC NOTE  Date of Service: 02/18/2020  Patient Care Team: [name], MD as PCP - General (Internal Medicine) [name], MD (Inactive) as Consulting Physician (Hematology and Oncology)  CHIEF COMPLAINTS/PURPOSE OF CONSULTATION:  F/u for continued mx of Multiple Myeloma  Oncologic History:  74 y.o. male with diagnosis of IgA lambda active multiple myeloma, ISS Stage I. Active disease diagnosed based on presence of anemia, kidney insufficiency, and paraproteinemia with significant predominance of lambda light chains as well as significant elevation of IgA. Bone marrow biopsy confirmed presence of atypical monoclonal plasma cell process in the bone marrow comprising at least 7% of the cellularity. Based on the findings, patient was started on treatment with lenalidomide, bortezomib, and low-dose dexamethasone based on the anticipated tolerance by the patient. Lenalidomide was started at 10 mg daily based on creatinine clearance of 42, dexamethasone dose was reduced to 20 mg weekly based on patient's age. Treatment was complicated by rapid development of cutaneous rash attributed to lenalidomide, inaddition to decreased renal function and now patient has persistent severe anemia. Side-effects were attributed to Lenalidomide and lenalidomide was discontinued with subsequent recovery.  Patient has been receiving bortezomib weekly with low-dose dexamethasone in 4-day cycles.  Patient has completed induction systemic therapy for his disease with repeat bone marrow biopsy confirming complete response including no evidence of minimal residual disease by cytogenetics or FISH.  HISTORY OF PRESENTING ILLNESS:   Robert Sparks is a wonderful 74 y.o. male who has been referred to [place] by my colleague Dr Grace Isaac for evaluation and management of Multiple Myeloma. The pt reports that he is doing well overall.   The pt has been previously followed by my colleague  Dr Grace Isaac. The pt was treated with Revlimid, Velcade, and dexamethasone and was subsequently switched to Velcade and Dexamethasone after a Revlimid intolerance.   The pt reports some knee aches and skin soreness in his legs which is worse at night. He notes that he has had diabetic-related peripheral neuropathy. The pt has not seen [facility] yet for a discussion of a BM transplant. He continues taking Acyclovir and notes no difficulty taking maintenance Velcade.   He adds that he takes Gabapentin and sweats.  He notes that his neuropathy is currently stable and denies it worsening.   He notes that a pack of cigarettes lasts him for about 3 days.   Most recent lab results (12/14/17) of CBC and CMP  is as follows: all values are WNL except for RBC at 3.08, HGB at 10.1, HCT at 31.0, MCV at 100.6, Eosinophils Abs at 1.4k, Potassium at 3.4, Glucose at 156, Creatinine at 1.60. LDH 12/14/17 is 141 Beta 2 microglobulin 12/14/17 is 2.4  On review of systems, pt reports knee pain, night time LE skin soreness, peripheral neuropathy, eating well, strong appetite, intermittent sweating and denies fevers, chills, back pains, abdominal pains, and any other symptoms.   [name] DM type 2, HTN, CKD. On Social Hx the pt reports smoking a pack of cigarettes every 3 days.   [name] returns today regarding management and evaluation of his Multiple Myeloma. [name] was on 11/17/2019. The pt reports that he is doing well overall.  The pt reports that he has been is sweating excessively and has noticed improvement in his neuropathy. Pt takes a B-complex vitamin and Vitamin D3 daily.   Lab results today (02/18/20) of CBC  w/diff is as follows: all values are WNL except for RBC at 3.37, Hgb at 11.5, HCT at 33.6, MCH at 34.1, Eos Abs at 1.6K. 02/18/2020 Vitamin D 25 (OH) at 24.52 02/18/2020 MMP shows no M spike. IFE +ve for IgA lambda monoclonal  protein  On review of systems, pt reports excessive sweating, leg swelling, memory issues, constipation and denies rash, low appetite, unexpected weight loss, fatigue, tingling/numbess in fingers/toes and any other symptoms.    MEDICAL HISTORY:  Past Medical History:  Diagnosis Date  . Anemia   . Arthritis    shoulders,feet   . Cataract    per eye dr -has appt 5-11  . CKD (chronic kidney disease), stage III   . Elevated PSA   . History of ketoacidosis    03-29-2015   . History of sepsis    07-25-2013  non-compliant w/ medication  . Hyperlipidemia   . Hypertension   . Nocturia   . Peripheral neuropathy   . Prostate cancer ([place])   . Type 2 diabetes mellitus with insulin therapy ([place])     SURGICAL HISTORY: Past Surgical History:  Procedure Laterality Date  . CIRCUMCISION  1990's  . PROSTATE BIOPSY N/A 12/21/2015   Procedure: BIOPSY TRANSRECTAL ULTRASONIC PROSTATE (TUBP);  Surgeon: Festus Aloe, MD;  Location: [facility];  Service: Urology;  Laterality: N/A;    SOCIAL HISTORY: Social History   Socioeconomic History  . Marital status: Single    Spouse name: Not on file  . Number of children: Not on file  . Years of education: Not on file  . Highest education level: Not on file  Occupational History  . Not on file  Tobacco Use  . Smoking status: Current Every Day Smoker    Packs/day: 0.50    Years: 50.00    Pack years: 25.00    Types: Cigarettes  . Smokeless tobacco: Never Used  . Tobacco comment: 1 ppwk-4-5 a day   Vaping Use  . Vaping Use: Never used  Substance and Sexual Activity  . Alcohol use: Yes    Alcohol/week: 1.0 standard drink    Types: 1 Cans of beer per week    Comment: 1 every day-quit couple weeks ago  . Drug use: No  . Sexual activity: Not Currently  Other Topics Concern  . Not on file  Social History Narrative  . Not on file   Social Determinants of Health   Financial Resource Strain:   . Difficulty of Paying Living  Expenses:   Food Insecurity:   . Worried About Charity fundraiser in the Last Year:   . Arboriculturist in the Last Year:   Transportation Needs:   . Film/video editor (Medical):   [name] Lack of Transportation (Non-Medical):   Physical Activity:   . Days of Exercise per Week:   . Minutes of Exercise per Session:   Stress:   . Feeling of Stress :   Social Connections:   . Frequency of Communication with Friends and Family:   . Frequency of Social Gatherings with Friends and Family:   . Attends Religious Services:   . Active Member of Clubs or Organizations:   . Attends Archivist Meetings:   [name] Marital Status:   Intimate Partner Violence:   . Fear of Current or Ex-Partner:   . Emotionally Abused:   [name] Physically Abused:   . Sexually Abused:     FAMILY HISTORY: Family History  Problem Relation Age of  Onset  . Kidney disease Mother   . Cancer Neg Hx   . Colon cancer Neg Hx   . Colon polyps Neg Hx   . Esophageal cancer Neg Hx   . Rectal cancer Neg Hx   . Stomach cancer Neg Hx     ALLERGIES:  is allergic to other.  MEDICATIONS:  Current Outpatient Medications  Medication Sig Dispense Refill  . acyclovir (ZOVIRAX) 400 MG tablet TAKE 1 TABLET (400 MG TOTAL) BY MOUTH 2 (TWO) TIMES DAILY. 60 tablet 5  . amLODipine (NORVASC) 10 MG tablet Take 1 tablet (10 mg total) by mouth daily. 30 tablet 6  . aspirin EC 81 MG tablet Take 1 tablet (81 mg total) by mouth daily. 90 tablet 3  . b complex vitamins capsule Take 1 capsule by mouth daily. 30 capsule 5  . Blood Glucose Monitoring Suppl (ONETOUCH VERIO) w/Device KIT Use to test blood sugar TID. E11.2 1 kit 0  . buPROPion (WELLBUTRIN SR) 150 MG 12 hr tablet     . diclofenac Sodium (VOLTAREN) 1 % GEL Apply 4 g topically 4 (four) times daily. 150 g 5  . donepezil (ARICEPT) 5 MG tablet Take 1 tablet (5 mg total) by mouth at bedtime. For memory issues 30 tablet 2  . DULoxetine (CYMBALTA) 30 MG capsule Take 1 capsule (30 mg  total) by mouth daily. X 1 week then 60 mg daily- for nerve pain/back pain 60 capsule 5  . ferrous sulfate 325 (65 FE) MG tablet Take 325 mg by mouth daily.    [name] gabapentin (NEURONTIN) 300 MG capsule TAKE 1 CAPSULE BY MOUTH IN THE MORNING AND LUNCH, AND 2 AT BEDTIME 120 capsule 6  . glucose blood (ONETOUCH VERIO) test strip Use to test blood sugar TID. E11.2 100 each 2  . hydrOXYzine (ATARAX/VISTARIL) 25 MG tablet Take 1 tablet (25 mg total) by mouth 3 (three) times daily as needed for itching (rash). 30 tablet 0  . insulin aspart protamine - aspart (NOVOLOG 70/30 MIX) (70-30) 100 UNIT/ML FlexPen Inject 0.06 mLs (6 Units total) into the skin 2 (two) times daily with a meal. 15 mL 6  . ixazomib citrate (NINLARO) 3 MG capsule Take 1 capsule (3 mg) by mouth weekly, 3 weeks on, 1 week off, repeat every 4 weeks. Take on an empty stomach 1hr before or 2hr after meals. (Patient not taking: Reported on 07/29/2019) 3 capsule 3  . lidocaine (LIDODERM) 5 %     . metaxalone (SKELAXIN) 800 MG tablet TAKE 1 TABLET (800 MG TOTAL) BY MOUTH AT BEDTIME AS NEEDED FOR MUSCLE SPASMS. 30 tablet 3  . Multiple Vitamin (MULTIVITAMIN WITH MINERALS) TABS tablet Take 1 tablet by mouth daily. 60 tablet 2  . OneTouch Delica Lancets 26S MISC Use to test blood sugar TID. E11.2 100 each 2  . pravastatin (PRAVACHOL) 20 MG tablet TAKE 1 TABLET BY MOUTH DAILY. 90 tablet 1  . tadalafil (CIALIS) 20 MG tablet Take 20 mg by mouth as needed.    . TRUEPLUS PEN NEEDLES 32G X 4 MM MISC USE AS DIRECTED TWICE DAILY WITH INSULIN 100 each 0  . vitamin B-12 (CYANOCOBALAMIN) 100 MCG tablet Take 100 mcg by mouth daily.    . Vitamins/Minerals TABS Take by mouth.     No current facility-administered medications for this visit.    REVIEW OF SYSTEMS:   A 10+ POINT REVIEW OF SYSTEMS WAS OBTAINED including neurology, dermatology, psychiatry, cardiac, respiratory, lymph, extremities, GI, GU, Musculoskeletal, constitutional, breasts, reproductive, HEENT.  All pertinent positives are noted in the HPI.  All others are negative.   PHYSICAL EXAMINATION: ECOG PERFORMANCE STATUS: 1 - Symptomatic but completely ambulatory Exam was given in a chair  .BP 130/78 (BP Location: Left Arm, Patient Position: Sitting)   Pulse 73   Temp (!) 97.4 F (36.3 C) (Oral)   Resp 20   Ht '5\' 5"'  (1.651 m)   Wt 158 lb (71.7 kg)   SpO2 100%   BMI 26.29 kg/m    GENERAL:alert, in no acute distress and comfortable SKIN: no acute rashes, no significant lesions EYES: conjunctiva are pink and non-injected, sclera anicteric OROPHARYNX: MMM, no exudates, no oropharyngeal erythema or ulceration NECK: supple, no JVD LYMPH:  no palpable lymphadenopathy in the cervical, axillary or inguinal regions LUNGS: clear to auscultation b/l with normal respiratory effort HEART: regular rate & rhythm ABDOMEN:  normoactive bowel sounds , non tender, not distended. No palpable hepatosplenomegaly.  Extremity: no pedal edema PSYCH: alert & oriented x 3 with fluent speech NEURO: no focal motor/sensory deficits  LABORATORY DATA:  I have reviewed the data as listed  . CBC Latest Ref Rng & Units 02/18/2020 11/17/2019 08/18/2019  WBC 4.0 - 10.5 K/uL 7.7 6.1 5.0  Hemoglobin 13.0 - 17.0 g/dL 11.5(L) 10.8(L) 10.6(L)  Hematocrit 39 - 52 % 33.6(L) 32.1(L) 31.6(L)  Platelets 150 - 400 K/uL 205 207 154    . CMP Latest Ref Rng & Units 11/17/2019 08/18/2019 06/17/2019  Glucose 70 - 99 mg/dL 141(H) 179(H) 128(H)  BUN 8 - 23 mg/dL 17 24(H) 21  Creatinine 0.61 - 1.24 mg/dL 1.59(H) 1.73(H) 1.57(H)  Sodium 135 - 145 mmol/L 142 141 138  Potassium 3.5 - 5.1 mmol/L 3.8 3.8 3.9  Chloride 98 - 111 mmol/L 109 108 105  CO2 22 - 32 mmol/L '24 23 23  ' Calcium 8.9 - 10.3 mg/dL 9.1 9.1 9.2  Total Protein 6.5 - 8.1 g/dL 6.8 6.4(L) 7.1  Total Bilirubin 0.3 - 1.2 mg/dL 0.4 0.4 0.5  Alkaline Phos 38 - 126 U/L 70 96 90  AST 15 - 41 U/L 23 17 40  ALT 0 - 44 U/L 21 19 40   08/29/17 BM Bx:    08/29/17  Cytogenetics:   03/13/17 Cytogenetics:          RADIOGRAPHIC STUDIES: I have personally reviewed the radiological images as listed and agreed with the findings in the report. No results found.  ASSESSMENT & PLAN:   74 y.o. male with   1. IgA Lambda Multiple Myeloma ISS Stage I -Currently in remission  Active disease was previously diagnosed based on presence of anemia, kidney insufficiency, and paraproteinemia with significant predominance of lambda light chains as well as significant elevation of IgA.  PLAN:  -Discussed pt labwork today, 02/18/20; WBC & PLT are nml, Hgb is improved, Vitamin D is running low, MMP - no M spike, IFE +ve for IgA lambda protein -Discussed 11/17/2019 K/L light chains is as follows: Kappa free light chain at 53.5, Lamda free light chains at 29.7, K/L light chain ratio at 1.80; MMP shows all values are WNL, no M Protein observed -No lab or clinical indication of Multiple Myeloma return or progression at this time -Recommend pt drink 48-64 oz of water per day and eat lots of fresh fruits and vegetables -Will continue to hold maintenance [place] due to significant side effects (rash/neuropathy)- will have low threshold to restart if signs of progression. -Continue B-complex and Vitamin D daily  -Will see back in 3  months with labs   FOLLOW UP: RTC with Dr Irene Limbo with labs in 3 months   The total time spent in the appt was 20 minutes and more than 50% was on counseling and direct patient cares.  All of the patient's questions were answered with apparent satisfaction. The patient knows to call the clinic with any problems, questions or concerns.   Sullivan Lone MD [place] AAHIVMS [facility] Hematology/Oncology Physician [facility]  (Office):       [phone] (Work cell):  [phone] (Fax):           [phone]  02/18/2020 3:04 PM  I, Yevette Edwards, am acting as a scribe for Dr. Sullivan Lone.   .I have reviewed the above  documentation for accuracy and completeness, and I agree with the above. Brunetta Genera MD

## 2020-02-19 ENCOUNTER — Telehealth: Payer: Self-pay | Admitting: Hematology

## 2020-02-19 MED FILL — TRUEplus 5-BEVEL PEN NEEDLE: 32G X 4 MM | 50 days supply | Qty: 100 | Fill #0

## 2020-02-19 NOTE — Telephone Encounter (Signed)
Scheduled per 08/11 los, patient has been called and notified. 

## 2020-02-20 LAB — MULTIPLE MYELOMA PANEL, SERUM
Albumin SerPl Elph-Mcnc: 4 g/dL (ref 2.9–4.4)
Albumin/Glob SerPl: 1.4 (ref 0.7–1.7)
Alpha 1: 0.2 g/dL (ref 0.0–0.4)
Alpha2 Glob SerPl Elph-Mcnc: 0.8 g/dL (ref 0.4–1.0)
B-Globulin SerPl Elph-Mcnc: 0.8 g/dL (ref 0.7–1.3)
Gamma Glob SerPl Elph-Mcnc: 1.1 g/dL (ref 0.4–1.8)
Globulin, Total: 2.9 g/dL (ref 2.2–3.9)
IgA: 186 mg/dL (ref 61–437)
IgG (Immunoglobin G), Serum: 977 mg/dL (ref 603–1613)
IgM (Immunoglobulin M), Srm: 99 mg/dL (ref 15–143)
Total Protein ELP: 6.9 g/dL (ref 6.0–8.5)

## 2020-02-26 ENCOUNTER — Other Ambulatory Visit: Payer: Self-pay | Admitting: Internal Medicine

## 2020-02-26 DIAGNOSIS — I10 Essential (primary) hypertension: Secondary | ICD-10-CM

## 2020-02-26 MED FILL — ONE TOUCH VERIO TEST STRIP: 33 days supply | Qty: 100 | Fill #2

## 2020-02-26 MED FILL — AMLODIPINE BESYLATE 10 MG T: 10 | 90 days supply | Qty: 90 | Fill #0

## 2020-02-26 NOTE — Telephone Encounter (Signed)
Approved per protocol.  Requested Prescriptions  Pending Prescriptions Disp Refills   amLODipine (NORVASC) 10 MG tablet [Pharmacy Med Name: AMLODIPINE BESYLATE 10 MG T 10 Tablet] 30 tablet 6    Sig: TAKE 1 TABLET (10 MG TOTAL) BY MOUTH DAILY.     Cardiovascular:  Calcium Channel Blockers Passed - 02/26/2020  2:32 PM      Passed - Last BP in normal range    BP Readings from Last 1 Encounters:  02/18/20 130/78         Passed - Valid encounter within last 6 months    Recent Outpatient Visits          2 months ago Controlled type 2 diabetes mellitus with stage 3 chronic kidney disease, with long-term current use of insulin ([place])   [place] [name], MD   7 months ago Appointment canceled by hospital   [place] Karle Plumber B, MD   10 months ago Type 2 diabetes mellitus with diabetic polyneuropathy, with long-term current use of insulin [facility])   [place] Karle Plumber B, MD   1 year ago Controlled type 2 diabetes mellitus with stage 3 chronic kidney disease, with long-term current use of insulin ([place])   [place] Karle Plumber B, MD   1 year ago Controlled type 2 diabetes mellitus with stage 3 chronic kidney disease, with long-term current use of insulin [facility])   [place] [facility] [name], MD

## 2020-03-05 ENCOUNTER — Other Ambulatory Visit: Payer: Medicare HMO | Admitting: Orthotics

## 2020-03-05 ENCOUNTER — Other Ambulatory Visit: Payer: Self-pay

## 2020-03-08 DIAGNOSIS — N183 Chronic kidney disease, stage 3 unspecified: Secondary | ICD-10-CM | POA: Diagnosis not present

## 2020-03-12 MED FILL — NOVOLOG MIX 70-30 FLEXPEN S: (70-30) 100 | 25 days supply | Qty: 3 | Fill #10

## 2020-03-16 MED FILL — GABAPENTIN 300 MG CAPSULE: 300 | 30 days supply | Qty: 120 | Fill #1

## 2020-03-19 DIAGNOSIS — D631 Anemia in chronic kidney disease: Secondary | ICD-10-CM | POA: Diagnosis not present

## 2020-03-19 DIAGNOSIS — N183 Chronic kidney disease, stage 3 unspecified: Secondary | ICD-10-CM | POA: Diagnosis not present

## 2020-03-19 DIAGNOSIS — C9000 Multiple myeloma not having achieved remission: Secondary | ICD-10-CM | POA: Diagnosis not present

## 2020-03-19 DIAGNOSIS — I129 Hypertensive chronic kidney disease with stage 1 through stage 4 chronic kidney disease, or unspecified chronic kidney disease: Secondary | ICD-10-CM | POA: Diagnosis not present

## 2020-03-19 DIAGNOSIS — N2581 Secondary hyperparathyroidism of renal origin: Secondary | ICD-10-CM | POA: Diagnosis not present

## 2020-03-22 ENCOUNTER — Other Ambulatory Visit: Payer: Self-pay | Admitting: Internal Medicine

## 2020-03-22 DIAGNOSIS — Z794 Long term (current) use of insulin: Secondary | ICD-10-CM

## 2020-03-23 MED FILL — ONE TOUCH VERIO TEST STRIP: 33 days supply | Qty: 100 | Fill #0

## 2020-04-05 MED FILL — NOVOLOG MIX 70-30 FLEXPEN S: (70-30) 100 | 25 days supply | Qty: 3 | Fill #11

## 2020-04-09 ENCOUNTER — Other Ambulatory Visit: Payer: Self-pay | Admitting: Physical Medicine and Rehabilitation

## 2020-04-09 MED FILL — METAXALONE 800 MG TABLET: 800 | 30 days supply | Qty: 30 | Fill #0

## 2020-04-19 MED FILL — ONE TOUCH VERIO TEST STRIP: 33 days supply | Qty: 100 | Fill #1

## 2020-04-19 MED FILL — ACYCLOVIR 400 MG TABLET: 400 | 30 days supply | Qty: 60 | Fill #2

## 2020-04-19 MED FILL — GABAPENTIN 300 MG CAPSULE: 300 | 30 days supply | Qty: 120 | Fill #2

## 2020-04-19 MED FILL — PRAVASTATIN SODIUM 20 MG TA: 20 | 90 days supply | Qty: 90 | Fill #1

## 2020-04-22 ENCOUNTER — Other Ambulatory Visit: Payer: Self-pay

## 2020-04-22 ENCOUNTER — Ambulatory Visit (INDEPENDENT_AMBULATORY_CARE_PROVIDER_SITE_OTHER): Payer: Medicare HMO | Admitting: Orthotics

## 2020-04-22 DIAGNOSIS — B351 Tinea unguium: Secondary | ICD-10-CM | POA: Diagnosis not present

## 2020-04-22 DIAGNOSIS — M79675 Pain in left toe(s): Secondary | ICD-10-CM

## 2020-04-22 DIAGNOSIS — Q828 Other specified congenital malformations of skin: Secondary | ICD-10-CM

## 2020-04-22 DIAGNOSIS — M79674 Pain in right toe(s): Secondary | ICD-10-CM | POA: Diagnosis not present

## 2020-04-22 DIAGNOSIS — E1142 Type 2 diabetes mellitus with diabetic polyneuropathy: Secondary | ICD-10-CM

## 2020-04-22 DIAGNOSIS — M2011 Hallux valgus (acquired), right foot: Secondary | ICD-10-CM

## 2020-04-22 DIAGNOSIS — L84 Corns and callosities: Secondary | ICD-10-CM

## 2020-04-22 DIAGNOSIS — M2012 Hallux valgus (acquired), left foot: Secondary | ICD-10-CM

## 2020-04-22 NOTE — Progress Notes (Signed)

## 2020-04-23 ENCOUNTER — Ambulatory Visit: Payer: Medicare HMO | Admitting: Orthotics

## 2020-04-30 ENCOUNTER — Other Ambulatory Visit: Payer: Self-pay | Admitting: Internal Medicine

## 2020-04-30 MED FILL — TRUEplus 5-BEVEL PEN NEEDLE: 32G X 4 MM | 50 days supply | Qty: 100 | Fill #0

## 2020-04-30 MED FILL — NOVOLOG MIX 70-30 FLEXPEN S: (70-30) 100 | 25 days supply | Qty: 3 | Fill #12

## 2020-05-04 ENCOUNTER — Ambulatory Visit: Payer: Medicare HMO | Attending: Internal Medicine | Admitting: Internal Medicine

## 2020-05-04 ENCOUNTER — Other Ambulatory Visit: Payer: Self-pay

## 2020-05-04 ENCOUNTER — Encounter: Payer: Self-pay | Admitting: Internal Medicine

## 2020-05-04 ENCOUNTER — Other Ambulatory Visit: Payer: Self-pay | Admitting: Internal Medicine

## 2020-05-04 VITALS — BP 134/72 | HR 74 | Resp 16 | Ht 66.5 in | Wt 161.6 lb

## 2020-05-04 DIAGNOSIS — Z794 Long term (current) use of insulin: Secondary | ICD-10-CM | POA: Diagnosis not present

## 2020-05-04 DIAGNOSIS — I10 Essential (primary) hypertension: Secondary | ICD-10-CM

## 2020-05-04 DIAGNOSIS — E785 Hyperlipidemia, unspecified: Secondary | ICD-10-CM

## 2020-05-04 DIAGNOSIS — E1169 Type 2 diabetes mellitus with other specified complication: Secondary | ICD-10-CM | POA: Diagnosis not present

## 2020-05-04 DIAGNOSIS — F0280 Dementia in other diseases classified elsewhere without behavioral disturbance: Secondary | ICD-10-CM

## 2020-05-04 DIAGNOSIS — N183 Chronic kidney disease, stage 3 unspecified: Secondary | ICD-10-CM

## 2020-05-04 DIAGNOSIS — L84 Corns and callosities: Secondary | ICD-10-CM | POA: Diagnosis not present

## 2020-05-04 DIAGNOSIS — F17200 Nicotine dependence, unspecified, uncomplicated: Secondary | ICD-10-CM

## 2020-05-04 DIAGNOSIS — R413 Other amnesia: Secondary | ICD-10-CM

## 2020-05-04 DIAGNOSIS — E1122 Type 2 diabetes mellitus with diabetic chronic kidney disease: Secondary | ICD-10-CM | POA: Diagnosis not present

## 2020-05-04 DIAGNOSIS — G4700 Insomnia, unspecified: Secondary | ICD-10-CM | POA: Diagnosis not present

## 2020-05-04 DIAGNOSIS — R69 Illness, unspecified: Secondary | ICD-10-CM | POA: Diagnosis not present

## 2020-05-04 DIAGNOSIS — E1142 Type 2 diabetes mellitus with diabetic polyneuropathy: Secondary | ICD-10-CM

## 2020-05-04 LAB — POCT GLYCOSYLATED HEMOGLOBIN (HGB A1C): HbA1c, POC (controlled diabetic range): 7.1 % — AB (ref 0.0–7.0)

## 2020-05-04 LAB — GLUCOSE, POCT (MANUAL RESULT ENTRY): POC Glucose: 132 mg/dl — AB (ref 70–99)

## 2020-05-04 MED ORDER — DONEPEZIL HCL 5 MG PO TABS: 5.0000 mg | ORAL_TABLET | Freq: Every day | ORAL | 2 refills | Status: DC

## 2020-05-04 MED ORDER — PRAVASTATIN SODIUM 20 MG PO TABS: 20.0000 mg | ORAL_TABLET | Freq: Every day | ORAL | 1 refills | Status: DC

## 2020-05-04 NOTE — Progress Notes (Signed)
Patient ID: Robert Sparks, male    DOB: 1945-08-31  MRN: [id number]  CC: Diabetes and Hypertension   Subjective: Robert Sparks is a 74 y.o. male who presents for chronic ds management His concerns today include:  Patient with history of HTN,DM2with polyneuropathy,HL,CKD stage3, tob dependence, EtOH abuse, prostate CA,OA knee,vit B 12def, spinal stenosis, and multiple myelomain remission, memory changes MMSE 04/2019).   HM:  Reports having receive his flu shot and COVID Booster shot last wk at [facility] [facility] Last A1C:   Results for orders placed or performed in visit on 64/68/03  Basic metabolic panel  Result Value Ref Range   Glucose 109 (H) 65 - 99 mg/dL   BUN 20 8 - 27 mg/dL   Creatinine, Ser 1.46 (H) 0.76 - 1.27 mg/dL   GFR calc non Af Amer 47 (L) >59 mL/min/1.73   GFR calc Af Amer 55 (L) >59 mL/min/1.73   BUN/Creatinine Ratio 14 10 - 24   Sodium 141 134 - 144 mmol/L   Potassium 4.0 3.5 - 5.2 mmol/L   Chloride 107 (H) 96 - 106 mmol/L   CO2 20 20 - 29 mmol/L   Calcium 9.9 8.6 - 10.2 mg/dL  Microalbumin / creatinine urine ratio  Result Value Ref Range   Creatinine, Urine 121.2 Not Estab. mg/dL   Microalbumin, Urine 309.4 Not Estab. ug/mL   Microalb/Creat Ratio 255 (H) 0 - 29 mg/g creat  POCT glucose (manual entry)  Result Value Ref Range   POC Glucose 132 (A) 70 - 99 mg/dl  POCT glycosylated hemoglobin (Hb A1C)  Result Value Ref Range   Hemoglobin A1C     HbA1c POC (<> result, manual entry)     HbA1c, POC (prediabetic range)     HbA1c, POC (controlled diabetic range) 7.1 (A) 0.0 - 7.0 %    Lab Results  Component Value Date   HGBA1C 7.1 (A) 05/04/2020   Med Adherence:  '[x]'  Yes    '[]'  No Medication side effects:  '[]'  Yes    '[x]'  No Home Monitoring?  '[x]'  Yes  -before BF and lunch and at bedtime.  Gives a.m and before lunch range 150-160.  Bedtime BS about same Home glucose results range: Diet Adherence: "I eat like a pig."  Snacks on  cookies and other sweet Exercise: '[x]'  Yes -walks to store daily to purchase cigarettes Hypoglycemic episodes?: '[]'  Yes    '[x]'  No Numbness of the feet? '[x]'  Yes.  Has painful callous at the base of RT big toe and one on LT foot.  Sees podiatrist regularly to keep them shaved down.  Has appt coming up Retinopathy hx? '[]'  Yes    '[x]'  No Last eye exam:  Comments:   Wants something to help him sleep.  Usually gets in bed around 8 p.m but takes 2-3 hrs before he falls a sleep.  Lays in bed watching TV.  TV stays on all night. When he falls asleep, he has a lot of vivid dreams.  Wants med so that he does not dream as much.  Wants RF on med he had last yr to help with memory.  Saw PMR Dr. Dagoberto Ligas last yr and was prescribed low dose Aricept but ran out of it.  Scored 22/30 on MMSE one yr ago  HTN:  Checks BP at home.  Reports readings have been good in the 120s/70-80s Compliant with meds and low salt intake. No CP/SOB or LE edema  Tob dep:  Smoking 1/3  pk a day.  States he needs to quit but not ready to.  Drinks a beer "ever now and then but not every day."  HL:  Compliant with Pravachol  Patient Active Problem List   Diagnosis Date Noted  . Microalbuminuria 04/27/2019  . Memory changes 04/25/2019  . Hyperlipidemia associated with type 2 diabetes mellitus ([place]) 04/25/2019  . Dementia associated with other underlying disease without behavioral disturbance ([place]) 03/14/2019  . Chronic bilateral low back pain without sciatica 03/14/2019  . Spinal stenosis, lumbar region with neurogenic claudication 03/14/2019  . Myofascial muscle pain 03/14/2019  . History of prostate cancer 12/20/2018  . Neuropathy of right hand 12/20/2018  . HCAP (healthcare-associated pneumonia) 05/02/2018  . Cough   . Primary osteoarthritis of right knee 01/07/2018  . Diabetic polyneuropathy associated with type 2 diabetes mellitus ([place]) 01/07/2018  . Vitamin B12 deficiency 09/04/2017  . Pre-ulcerative corn or callous  04/12/2017  . Cataract of both eyes 04/12/2017  . Tobacco dependence 04/12/2017  . Hypokalemia 03/23/2017  . Multiple myeloma in remission ([place]) 02/23/2017  . Oral leukoplakia 02/09/2017  . Lightheadedness 11/10/2016  . Hyperlipidemia 06/21/2016  . CKD (chronic kidney disease) stage 3, GFR 30-59 ml/min (HCC) 02/23/2016  . Malignant neoplasm of prostate ([place]) 02/21/2016  . Controlled type 2 diabetes mellitus with stage 3 chronic kidney disease, with long-term current use of insulin ([place]) 03/29/2015  . HTN (hypertension) 07/28/2013  . Anemia, chronic disease 07/26/2013  . Sepsis ([place]) 07/25/2013  . ETOH abuse 07/25/2013     Current Outpatient Medications on File Prior to Visit  Medication Sig Dispense Refill  . acyclovir (ZOVIRAX) 400 MG tablet TAKE 1 TABLET (400 MG TOTAL) BY MOUTH 2 (TWO) TIMES DAILY. 60 tablet 5  . amLODipine (NORVASC) 10 MG tablet TAKE 1 TABLET (10 MG TOTAL) BY MOUTH DAILY. 90 tablet 0  . aspirin EC 81 MG tablet Take 1 tablet (81 mg total) by mouth daily. 90 tablet 3  . b complex vitamins capsule Take 1 capsule by mouth daily. 30 capsule 5  . Blood Glucose Monitoring Suppl (ONETOUCH VERIO) w/Device KIT Use to test blood sugar TID. E11.2 1 kit 0  . buPROPion (WELLBUTRIN SR) 150 MG 12 hr tablet     . diclofenac Sodium (VOLTAREN) 1 % GEL Apply 4 g topically 4 (four) times daily. 150 g 5  . DULoxetine (CYMBALTA) 30 MG capsule Take 1 capsule (30 mg total) by mouth daily. X 1 week then 60 mg daily- for nerve pain/back pain 60 capsule 5  . ferrous sulfate 325 (65 FE) MG tablet Take 325 mg by mouth daily.    [name] gabapentin (NEURONTIN) 300 MG capsule TAKE 1 CAPSULE BY MOUTH IN THE MORNING AND LUNCH, AND 2 AT BEDTIME 120 capsule 6  . glucose blood (ONETOUCH VERIO) test strip USE TO TEST BLOOD SUGAR THREE TIMES DAILY E11.2 100 strip 2  . hydrOXYzine (ATARAX/VISTARIL) 25 MG tablet Take 1 tablet (25 mg total) by mouth 3 (three) times daily as needed for itching (rash). 30 tablet 0   . insulin aspart protamine - aspart (NOVOLOG 70/30 MIX) (70-30) 100 UNIT/ML FlexPen Inject 0.06 mLs (6 Units total) into the skin 2 (two) times daily with a meal. 15 mL 6  . ixazomib citrate (NINLARO) 3 MG capsule Take 1 capsule (3 mg) by mouth weekly, 3 weeks on, 1 week off, repeat every 4 weeks. Take on an empty stomach 1hr before or 2hr after meals. (Patient not taking: Reported on 07/29/2019) 3 capsule 3  .  lidocaine (LIDODERM) 5 %     . metaxalone (SKELAXIN) 800 MG tablet TAKE 1 TABLET (800 MG TOTAL) BY MOUTH AT BEDTIME AS NEEDED FOR MUSCLE SPASMS. 30 tablet 3  . Multiple Vitamin (MULTIVITAMIN WITH MINERALS) TABS tablet Take 1 tablet by mouth daily. 60 tablet 2  . OneTouch Delica Lancets 43X MISC Use to test blood sugar TID. E11.2 100 each 2  . tadalafil (CIALIS) 20 MG tablet Take 20 mg by mouth as needed.    . TRUEPLUS 5-BEVEL PEN NEEDLES 32G X 4 MM MISC USE AS DIRECTED TWICE DAILY WITH INSULIN 100 each 0  . vitamin B-12 (CYANOCOBALAMIN) 100 MCG tablet Take 100 mcg by mouth daily.    . Vitamins/Minerals TABS Take by mouth.     No current facility-administered medications on file prior to visit.    Allergies  Allergen Reactions  . Other Other (See Comments)    Nicoderm CQ = Bad Dreams Nicotine gum = Bad Dreams     Social History   Socioeconomic History  . Marital status: Single    Spouse name: Not on file  . Number of children: Not on file  . Years of education: Not on file  . Highest education level: Not on file  Occupational History  . Not on file  Tobacco Use  . Smoking status: Current Every Day Smoker    Packs/day: 0.50    Years: 50.00    Pack years: 25.00    Types: Cigarettes  . Smokeless tobacco: Never Used  . Tobacco comment: 1 ppwk-4-5 a day   Vaping Use  . Vaping Use: Never used  Substance and Sexual Activity  . Alcohol use: Yes    Alcohol/week: 1.0 standard drink    Types: 1 Cans of beer per week    Comment: 1 every day-quit couple weeks ago  . Drug use:  No  . Sexual activity: Not Currently  Other Topics Concern  . Not on file  Social History Narrative  . Not on file   Social Determinants of Health   Financial Resource Strain:   . Difficulty of Paying Living Expenses: Not on file  Food Insecurity:   . Worried About Charity fundraiser in the Last Year: Not on file  . Ran Out of Food in the Last Year: Not on file  Transportation Needs:   . Lack of Transportation (Medical): Not on file  . Lack of Transportation (Non-Medical): Not on file  Physical Activity:   . Days of Exercise per Week: Not on file  . Minutes of Exercise per Session: Not on file  Stress:   . Feeling of Stress : Not on file  Social Connections:   . Frequency of Communication with Friends and Family: Not on file  . Frequency of Social Gatherings with Friends and Family: Not on file  . Attends Religious Services: Not on file  . Active Member of Clubs or Organizations: Not on file  . Attends Archivist Meetings: Not on file  . Marital Status: Not on file  Intimate Partner Violence:   . Fear of Current or Ex-Partner: Not on file  . Emotionally Abused: Not on file  . Physically Abused: Not on file  . Sexually Abused: Not on file    Family History  Problem Relation Age of Onset  . Kidney disease Mother   . Cancer Neg Hx   . Colon cancer Neg Hx   . Colon polyps Neg Hx   . Esophageal cancer Neg Hx   .  Rectal cancer Neg Hx   . Stomach cancer Neg Hx     Past Surgical History:  Procedure Laterality Date  . CIRCUMCISION  1990's  . PROSTATE BIOPSY N/A 12/21/2015   Procedure: BIOPSY TRANSRECTAL ULTRASONIC PROSTATE (TUBP);  Surgeon: Festus Aloe, MD;  Location: [facility];  Service: Urology;  Laterality: N/A;    ROS: Review of Systems Negative except as stated above  PHYSICAL EXAM: BP 134/72   Pulse 74   Resp 16   Ht 5' 6.5" (1.689 m)   Wt 161 lb 9.6 oz (73.3 kg)   SpO2 97%   BMI 25.69 kg/m   Wt Readings from Last 3  Encounters:  05/04/20 161 lb 9.6 oz (73.3 kg)  02/18/20 158 lb (71.7 kg)  12/01/19 165 lb 9.6 oz (75.1 kg)    Physical Exam  General appearance - alert, well appearing, pleasant elderly African-American male and in no distress Mental status -patient is oriented but somewhat forgetful.   Eyes -nonicteric sclera. Mouth - mucous membranes moist, pharynx normal without lesions Neck - supple, no significant adenopathy Chest - clear to auscultation, no wheezes, rales or rhonchi, symmetric air entry Heart - normal rate, regular rhythm, normal S1, S2, no murmurs, rubs, clicks or gallops Extremities -no lower extremity edema. Diabetic Foot Exam - Simple   Simple Foot Form Visual Inspection See comments: Yes Sensation Testing See comments: Yes Pulse Check See comments: Yes Comments 2 cm preulcerative callus at the medial base plantar surface of the right big toe.  Similar sized callus on the ball of the left foot.  Decreased sensation on leap exam on the plantar surface of both feet.  Dorsalis pedis pulses 2+ bilaterally.     Depression screen [facility] 2/9 05/04/2020 04/25/2019 08/27/2018  Decreased Interest 0 0 0  Down, Depressed, Hopeless 0 0 0  PHQ - 2 Score 0 0 0  Altered sleeping - - 0  Tired, decreased energy - - 0  Change in appetite - - 0  Feeling bad or failure about yourself  - - 0  Trouble concentrating - - -  Moving slowly or fidgety/restless - - 0  Suicidal thoughts - - 0  PHQ-9 Score - - 0  Difficult doing work/chores - - Not difficult at all  Some recent data might be hidden    CMP Latest Ref Rng & Units 05/04/2020 11/17/2019 08/18/2019  Glucose 65 - 99 mg/dL 109(H) 141(H) 179(H)  BUN 8 - 27 mg/dL 20 17 24(H)  Creatinine 0.76 - 1.27 mg/dL 1.46(H) 1.59(H) 1.73(H)  Sodium 134 - 144 mmol/L 141 142 141  Potassium 3.5 - 5.2 mmol/L 4.0 3.8 3.8  Chloride 96 - 106 mmol/L 107(H) 109 108  CO2 20 - 29 mmol/L '20 24 23  ' Calcium 8.6 - 10.2 mg/dL 9.9 9.1 9.1  Total Protein 6.5 -  8.1 g/dL - 6.8 6.4(L)  Total Bilirubin 0.3 - 1.2 mg/dL - 0.4 0.4  Alkaline Phos 38 - 126 U/L - 70 96  AST 15 - 41 U/L - 23 17  ALT 0 - 44 U/L - 21 19   Lipid Panel     Component Value Date/Time   CHOL 184 04/25/2019 0939   TRIG 114 04/25/2019 0939   HDL 88 04/25/2019 0939   CHOLHDL 2.1 04/25/2019 0939   CHOLHDL 3.6 06/21/2016 1047   VLDL 50 (H) 06/21/2016 1047   LDLCALC 76 04/25/2019 0939    CBC    Component Value Date/Time   WBC 7.7 02/18/2020 1229  RBC 3.37 (L) 02/18/2020 1229   HGB 11.5 (L) 02/18/2020 1229   HGB 10.6 (L) 08/18/2019 1338   HGB 12.2 (L) 12/06/2017 0920   HGB 10.1 (L) 07/13/2017 0938   HCT 33.6 (L) 02/18/2020 1229   HCT 37.1 (L) 12/06/2017 0920   HCT 30.6 (L) 07/13/2017 0938   PLT 205 02/18/2020 1229   PLT 154 08/18/2019 1338   PLT 250 12/06/2017 0920   MCV 99.7 02/18/2020 1229   MCV 98 (H) 12/06/2017 0920   MCV 96.2 07/13/2017 0938   MCH 34.1 (H) 02/18/2020 1229   MCHC 34.2 02/18/2020 1229   RDW 12.5 02/18/2020 1229   RDW 14.1 12/06/2017 0920   RDW 14.4 07/13/2017 0938   LYMPHSABS 2.0 02/18/2020 1229   LYMPHSABS 0.8 (L) 07/13/2017 0938   MONOABS 0.4 02/18/2020 1229   MONOABS 0.4 07/13/2017 0938   EOSABS 1.6 (H) 02/18/2020 1229   EOSABS 0.5 07/13/2017 0938   EOSABS 0.8 (H) 11/21/2016 1416   BASOSABS 0.1 02/18/2020 1229   BASOSABS 0.0 07/13/2017 0938    ASSESSMENT AND PLAN: 1. Type 2 diabetes mellitus with peripheral neuropathy (HCC) Fasting blood sugar is slightly above goal.  However I have made no changes to his current insulin regimen.  I think increasing the dose may lead to hypoglycemic episodes for him.  Discussed and encourage eating healthier snacks like fruits or nuts. - POCT glucose (manual entry) - Basic metabolic panel - Microalbumin / creatinine urine ratio - POCT glycosylated hemoglobin (Hb A1C)  2. Essential hypertension Close to goal.  Continue current medications  3. Tobacco dependence Strongly advised to quit.   Patient feels he is not ready to quit completely.  Encouraged him to cut down.  Less than 5 minutes spent on counseling.  4. Memory changes Patient with abnormal score on Mini-Mental status exam last year.  Most likely has early dementia.  He will return in several weeks for his annual wellness visit at which time the Mini-Mental status exam will be repeated.  I have refilled Aricept. - donepezil (ARICEPT) 5 MG tablet; Take 1 tablet (5 mg total) by mouth at bedtime. For memory issues  Dispense: 30 tablet; Refill: 2  5. Pre-ulcerative corn or callous Patient to keep appointment with podiatrist.  6. Insomnia, unspecified type Good sleep hygiene discussed and encouraged.  Advised patient to get in bed around about the same time every night.  Since it takes him 3 hours to fall asleep, I recommend that he gets in bed around 10 or 11 PM instead of 8 PM.  Advised to turn off all lights and sounds once he is in bed.  If he does not fall asleep within 45 minutes of getting in bed he should get up and do something until he feels tired and sleepy.  Avoid drinking alcoholic beverages at bedtime.  Avoid drinking caffeinated beverages within 4 to 5 hours of bedtime.  In regards to his request for medication to help decrease the vivid dreams, we could prescribe prazosin but I will hold off at this time to avoid polypharmacy.  7. Hyperlipidemia associated with type 2 diabetes mellitus (HCC) - pravastatin (PRAVACHOL) 20 MG tablet; Take 1 tablet (20 mg total) by mouth daily.  Dispense: 90 tablet; Refill: 1    Patient was given the opportunity to ask questions.  Patient verbalized understanding of the plan and was able to repeat key elements of the plan.   Orders Placed This Encounter  Procedures  . Basic metabolic panel  .  Microalbumin / creatinine urine ratio  . POCT glucose (manual entry)  . POCT glycosylated hemoglobin (Hb A1C)     Requested Prescriptions   Signed Prescriptions Disp Refills  .  pravastatin (PRAVACHOL) 20 MG tablet 90 tablet 1    Sig: Take 1 tablet (20 mg total) by mouth daily.  [name] donepezil (ARICEPT) 5 MG tablet 30 tablet 2    Sig: Take 1 tablet (5 mg total) by mouth at bedtime. For memory issues    Return in about 4 months (around 09/04/2020) for with Amy Minette Brine in 3 wks for [employer].  Karle Plumber, MD, FACP

## 2020-05-05 ENCOUNTER — Telehealth: Payer: Self-pay

## 2020-05-05 LAB — BASIC METABOLIC PANEL
BUN/Creatinine Ratio: 14 (ref 10–24)
BUN: 20 mg/dL (ref 8–27)
CO2: 20 mmol/L (ref 20–29)
Calcium: 9.9 mg/dL (ref 8.6–10.2)
Chloride: 107 mmol/L — ABNORMAL HIGH (ref 96–106)
Creatinine, Ser: 1.46 mg/dL — ABNORMAL HIGH (ref 0.76–1.27)
GFR calc Af Amer: 55 mL/min/{1.73_m2} — ABNORMAL LOW (ref >59)
GFR calc non Af Amer: 47 mL/min/{1.73_m2} — ABNORMAL LOW (ref >59)
Glucose: 109 mg/dL — ABNORMAL HIGH (ref 65–99)
Potassium: 4 mmol/L (ref 3.5–5.2)
Sodium: 141 mmol/L (ref 134–144)

## 2020-05-05 LAB — MICROALBUMIN / CREATININE URINE RATIO
Creatinine, Urine: 121.2 mg/dL
Microalb/Creat Ratio: 255 mg/g{creat} — ABNORMAL HIGH (ref 0–29)
Microalbumin, Urine: 309.4 ug/mL

## 2020-05-05 MED FILL — DONEPEZIL HCL 5 MG TABLET: 5 | 90 days supply | Qty: 90 | Fill #0

## 2020-05-05 NOTE — Telephone Encounter (Signed)
Contacted pt to go over lab results pt is aware and doesn't have any questions or concerns 

## 2020-05-14 ENCOUNTER — Other Ambulatory Visit: Payer: Self-pay

## 2020-05-14 ENCOUNTER — Ambulatory Visit (INDEPENDENT_AMBULATORY_CARE_PROVIDER_SITE_OTHER): Payer: Medicare HMO | Admitting: Podiatry

## 2020-05-14 ENCOUNTER — Encounter: Payer: Self-pay | Admitting: Podiatry

## 2020-05-14 DIAGNOSIS — E1142 Type 2 diabetes mellitus with diabetic polyneuropathy: Secondary | ICD-10-CM

## 2020-05-14 DIAGNOSIS — Q828 Other specified congenital malformations of skin: Secondary | ICD-10-CM

## 2020-05-14 DIAGNOSIS — M79675 Pain in left toe(s): Secondary | ICD-10-CM | POA: Diagnosis not present

## 2020-05-14 DIAGNOSIS — M79674 Pain in right toe(s): Secondary | ICD-10-CM | POA: Diagnosis not present

## 2020-05-14 DIAGNOSIS — B351 Tinea unguium: Secondary | ICD-10-CM | POA: Diagnosis not present

## 2020-05-14 DIAGNOSIS — L84 Corns and callosities: Secondary | ICD-10-CM | POA: Diagnosis not present

## 2020-05-14 NOTE — Progress Notes (Signed)
Subjective: Robert Sparks presents today for follow up of at risk foot care. Pt has h/o NIDDM with chronic kidney disease, callus(es) submet head 1 b/l. Aggravating factors include weightbearing with and without shoe gear. Pain is relieved with periodic professional debridement. and painful porokeratotic lesion(s) plantar right hallux and painful mycotic toenails b/l that limit ambulation. Aggravating factors include weightbearing with and without shoe gear. Pain for both is relieved with periodic professional debridement.   Robert Sparks states his blood glucose was 147 mg/dl. He continues to smoke 1/3 pack of cigarettes/day. He has been smoking since the age of 42. He voices no new pedal concerns on today's visit.  Allergies  Allergen Reactions  . Other Other (See Comments)    Nicoderm CQ = Bad Dreams Nicotine gum = Bad Dreams     Objective: There were no vitals filed for this visit.  Pt is a pleasant 74 y.o. year old male  in NAD. AAO x 3.   Vascular Examination:  Capillary refill time to digits immediate b/l. Palpable DP pulses b/l. Palpable PT pulses b/l. Pedal hair absent b/l Skin temperature gradient within normal limits b/l. No edema noted b/l. No pain with calf compression b/l.  Dermatological Examination: Pedal skin with normal turgor, texture and tone bilaterally. No open wounds bilaterally. No interdigital macerations bilaterally. Toenails 1-5 b/l elongated, dystrophic, thickened, crumbly with subungual debris and tenderness to dorsal palpation. Hyperkeratotic lesion(s) submet head 1 left foot and submet head 1 right foot. No erythema, no edema, no drainage, no flocculence. Porokeratotic lesion(s) R hallux. No erythema, no edema, no drainage, no fluctuance.  Musculoskeletal: Normal muscle strength 5/5 to all lower extremity muscle groups bilaterally. No pain crepitus or joint limitation noted with ROM b/l. Hallux valgus with bunion deformity noted b/l.  Neurological: Protective  sensation diminished with 10g monofilament b/l. Clonus negative b/l.  Assessment: 1. Pain due to onychomycosis of toenails of both feet   2. Callus   3. Porokeratosis   4. Diabetic peripheral neuropathy associated with type 2 diabetes mellitus ([place])    Plan: -Examined patient. -No new findings. No new orders. -Continue diabetic foot care principles. -Toenails 1-5 b/l were debrided in length and girth with sterile nail nippers and dremel without iatrogenic bleeding.  -Callus(es) submet head 1 left foot and submet head 1 right foot pared utilizing sterile scalpel blade without complication or incident. Total number debrided =2. -Painful porokeratotic lesion(s) R hallux pared and enucleated with sterile scalpel blade without incident. Continue use of digital toe cap daily for protection of digit. -Patient to report any pedal injuries to medical professional immediately.  Return in about 3 months (around 08/14/2020) for diabetic foot care.  Marzetta Board, DPM

## 2020-05-17 MED FILL — ONE TOUCH VERIO TEST STRIP: 33 days supply | Qty: 100 | Fill #2

## 2020-05-18 MED FILL — METAXALONE 800 MG TABLET: 800 | 30 days supply | Qty: 30 | Fill #1

## 2020-05-19 ENCOUNTER — Inpatient Hospital Stay (HOSPITAL_BASED_OUTPATIENT_CLINIC_OR_DEPARTMENT_OTHER): Payer: Medicare HMO | Admitting: Hematology

## 2020-05-19 ENCOUNTER — Inpatient Hospital Stay: Payer: Medicare HMO | Attending: Hematology

## 2020-05-19 ENCOUNTER — Telehealth: Payer: Self-pay | Admitting: Hematology

## 2020-05-19 ENCOUNTER — Other Ambulatory Visit: Payer: Self-pay

## 2020-05-19 VITALS — BP 130/68 | HR 72 | Temp 97.9°F | Resp 18 | Ht 66.5 in | Wt 161.8 lb

## 2020-05-19 DIAGNOSIS — C9001 Multiple myeloma in remission: Secondary | ICD-10-CM | POA: Insufficient documentation

## 2020-05-19 LAB — CBC WITH DIFFERENTIAL/PLATELET
Abs Immature Granulocytes: 0.01 10*3/uL (ref 0.00–0.07)
Basophils Absolute: 0 10*3/uL (ref 0.0–0.1)
Basophils Relative: 1 %
Eosinophils Absolute: 0.9 10*3/uL — ABNORMAL HIGH (ref 0.0–0.5)
Eosinophils Relative: 14 %
HCT: 32.7 % — ABNORMAL LOW (ref 39.0–52.0)
Hemoglobin: 11.1 g/dL — ABNORMAL LOW (ref 13.0–17.0)
Immature Granulocytes: 0 %
Lymphocytes Relative: 28 %
Lymphs Abs: 1.7 10*3/uL (ref 0.7–4.0)
MCH: 33.9 pg (ref 26.0–34.0)
MCHC: 33.9 g/dL (ref 30.0–36.0)
MCV: 100 fL (ref 80.0–100.0)
Monocytes Absolute: 0.4 10*3/uL (ref 0.1–1.0)
Monocytes Relative: 7 %
Neutro Abs: 3.1 10*3/uL (ref 1.7–7.7)
Neutrophils Relative %: 50 %
Platelets: 196 10*3/uL (ref 150–400)
RBC: 3.27 MIL/uL — ABNORMAL LOW (ref 4.22–5.81)
RDW: 13 % (ref 11.5–15.5)
WBC: 6.2 10*3/uL (ref 4.0–10.5)
nRBC: 0 % (ref 0.0–0.2)

## 2020-05-19 LAB — CMP (CANCER CENTER ONLY)
ALT: 26 U/L (ref 0–44)
AST: 30 U/L (ref 15–41)
Albumin: 4.1 g/dL (ref 3.5–5.0)
Alkaline Phosphatase: 73 U/L (ref 38–126)
Anion gap: 8 (ref 5–15)
BUN: 22 mg/dL (ref 8–23)
CO2: 26 mmol/L (ref 22–32)
Calcium: 9.7 mg/dL (ref 8.9–10.3)
Chloride: 103 mmol/L (ref 98–111)
Creatinine: 1.54 mg/dL — ABNORMAL HIGH (ref 0.61–1.24)
GFR, Estimated: 48 mL/min — ABNORMAL LOW (ref >60)
Glucose, Bld: 161 mg/dL — ABNORMAL HIGH (ref 70–99)
Potassium: 3.9 mmol/L (ref 3.5–5.1)
Sodium: 137 mmol/L (ref 135–145)
Total Bilirubin: 0.7 mg/dL (ref 0.3–1.2)
Total Protein: 7.3 g/dL (ref 6.5–8.1)

## 2020-05-19 NOTE — Progress Notes (Signed)
HEMATOLOGY/ONCOLOGY CLINIC NOTE  Date of Service: 05/19/2020  Patient Care Team: [name], MD as PCP - General (Internal Medicine) [name], MD (Inactive) as Consulting Physician (Hematology and Oncology)  CHIEF COMPLAINTS/PURPOSE OF CONSULTATION:  F/u for continued mx of Multiple Myeloma  Oncologic History:  74 y.o. male with diagnosis of IgA lambda active multiple myeloma, ISS Stage I. Active disease diagnosed based on presence of anemia, kidney insufficiency, and paraproteinemia with significant predominance of lambda light chains as well as significant elevation of IgA. Bone marrow biopsy confirmed presence of atypical monoclonal plasma cell process in the bone marrow comprising at least 7% of the cellularity. Based on the findings, patient was started on treatment with lenalidomide, bortezomib, and low-dose dexamethasone based on the anticipated tolerance by the patient. Lenalidomide was started at 10 mg daily based on creatinine clearance of 42, dexamethasone dose was reduced to 20 mg weekly based on patient's age. Treatment was complicated by rapid development of cutaneous rash attributed to lenalidomide, inaddition to decreased renal function and now patient has persistent severe anemia. Side-effects were attributed to Lenalidomide and lenalidomide was discontinued with subsequent recovery.  Patient has been receiving bortezomib weekly with low-dose dexamethasone in 4-day cycles.  Patient has completed induction systemic therapy for his disease with repeat bone marrow biopsy confirming complete response including no evidence of minimal residual disease by cytogenetics or FISH.  HISTORY OF PRESENTING ILLNESS:   Robert Sparks is a wonderful 74 y.o. male who has been referred to [place] by my colleague Dr Grace Isaac for evaluation and management of Multiple Myeloma. The pt reports that he is doing well overall.   The pt has been previously followed by my colleague  Dr Grace Isaac. The pt was treated with Revlimid, Velcade, and dexamethasone and was subsequently switched to Velcade and Dexamethasone after a Revlimid intolerance.   The pt reports some knee aches and skin soreness in his legs which is worse at night. He notes that he has had diabetic-related peripheral neuropathy. The pt has not seen [facility] yet for a discussion of a BM transplant. He continues taking Acyclovir and notes no difficulty taking maintenance Velcade.   He adds that he takes Gabapentin and sweats.  He notes that his neuropathy is currently stable and denies it worsening.   He notes that a pack of cigarettes lasts him for about 3 days.   Most recent lab results (12/14/17) of CBC and CMP  is as follows: all values are WNL except for RBC at 3.08, HGB at 10.1, HCT at 31.0, MCV at 100.6, Eosinophils Abs at 1.4k, Potassium at 3.4, Glucose at 156, Creatinine at 1.60. LDH 12/14/17 is 141 Beta 2 microglobulin 12/14/17 is 2.4  On review of systems, pt reports knee pain, night time LE skin soreness, peripheral neuropathy, eating well, strong appetite, intermittent sweating and denies fevers, chills, back pains, abdominal pains, and any other symptoms.   [name] DM type 2, HTN, CKD. On Social Hx the pt reports smoking a pack of cigarettes every 3 days.   [name] returns today regarding management and evaluation of his Multiple Myeloma. [name] was on 02/18/2020. The pt reports that he is doing well overall.  The pt reports that he had left calf cramping a few nights ago that lasted about 40 minutes. The cramping improved after the pt massaged the area and began to move around. He denies  any redness, swelling, pain, or residual cramping in the area. Pt received his COVID19 vaccines and booster, as well as the flu vaccine. Pt has continued taking his B-complex vitamins.  Lab results today (05/19/20) of CBC w/diff and CMP is  as follows: all values are WNL except for RBC at 3.27, Hgb at 11.1, HCT at 32.7, Eos Abs at 0.9K, Glucose at 161, Creatinine at 1.54, GFR Est at 48. 05/19/2020 K/L light chains is in progress 05/19/2020 MMP is in progress  On review of systems, pt reports improved numbness/tingling in fingers and denies left calf swelling/redness, cramping and any other symptoms.   MEDICAL HISTORY:  Past Medical History:  Diagnosis Date  . Anemia   . Arthritis    shoulders,feet   . Cataract    per eye dr -has appt 5-11  . CKD (chronic kidney disease), stage III ([place])   . Elevated PSA   . History of ketoacidosis    03-29-2015   . History of sepsis    07-25-2013  non-compliant w/ medication  . Hyperlipidemia   . Hypertension   . Nocturia   . Peripheral neuropathy   . Prostate cancer ([place])   . Type 2 diabetes mellitus with insulin therapy ([place])     SURGICAL HISTORY: Past Surgical History:  Procedure Laterality Date  . CIRCUMCISION  1990's  . PROSTATE BIOPSY N/A 12/21/2015   Procedure: BIOPSY TRANSRECTAL ULTRASONIC PROSTATE (TUBP);  Surgeon: Festus Aloe, MD;  Location: [facility];  Service: Urology;  Laterality: N/A;    SOCIAL HISTORY: Social History   Socioeconomic History  . Marital status: Single    Spouse name: Not on file  . Number of children: Not on file  . Years of education: Not on file  . Highest education level: Not on file  Occupational History  . Not on file  Tobacco Use  . Smoking status: Current Every Day Smoker    Packs/day: 0.50    Years: 50.00    Pack years: 25.00    Types: Cigarettes  . Smokeless tobacco: Never Used  . Tobacco comment: 1 ppwk-4-5 a day   Vaping Use  . Vaping Use: Never used  Substance and Sexual Activity  . Alcohol use: Yes    Alcohol/week: 1.0 standard drink    Types: 1 Cans of beer per week    Comment: 1 every day-quit couple weeks ago  . Drug use: No  . Sexual activity: Not Currently  Other Topics Concern  . Not on  file  Social History Narrative  . Not on file   Social Determinants of Health   Financial Resource Strain:   . Difficulty of Paying Living Expenses: Not on file  Food Insecurity:   . Worried About Charity fundraiser in the Last Year: Not on file  . Ran Out of Food in the Last Year: Not on file  Transportation Needs:   . Lack of Transportation (Medical): Not on file  . Lack of Transportation (Non-Medical): Not on file  Physical Activity:   . Days of Exercise per Week: Not on file  . Minutes of Exercise per Session: Not on file  Stress:   . Feeling of Stress : Not on file  Social Connections:   . Frequency of Communication with Friends and Family: Not on file  . Frequency of Social Gatherings with Friends and Family: Not on file  . Attends Religious Services: Not on file  . Active Member of Clubs or Organizations: Not on file  .  Attends Archivist Meetings: Not on file  . Marital Status: Not on file  Intimate Partner Violence:   . Fear of Current or Ex-Partner: Not on file  . Emotionally Abused: Not on file  . Physically Abused: Not on file  . Sexually Abused: Not on file    FAMILY HISTORY: Family History  Problem Relation Age of Onset  . Kidney disease Mother   . Cancer Neg Hx   . Colon cancer Neg Hx   . Colon polyps Neg Hx   . Esophageal cancer Neg Hx   . Rectal cancer Neg Hx   . Stomach cancer Neg Hx     ALLERGIES:  is allergic to other.  MEDICATIONS:  Current Outpatient Medications  Medication Sig Dispense Refill  . acyclovir (ZOVIRAX) 400 MG tablet TAKE 1 TABLET (400 MG TOTAL) BY MOUTH 2 (TWO) TIMES DAILY. 60 tablet 5  . amLODipine (NORVASC) 10 MG tablet TAKE 1 TABLET (10 MG TOTAL) BY MOUTH DAILY. 90 tablet 0  . aspirin EC 81 MG tablet Take 1 tablet (81 mg total) by mouth daily. 90 tablet 3  . b complex vitamins capsule Take 1 capsule by mouth daily. 30 capsule 5  . Blood Glucose Monitoring Suppl (ONETOUCH VERIO) w/Device KIT Use to test blood sugar  TID. E11.2 1 kit 0  . buPROPion (WELLBUTRIN SR) 150 MG 12 hr tablet     . diclofenac Sodium (VOLTAREN) 1 % GEL Apply 4 g topically 4 (four) times daily. 150 g 5  . donepezil (ARICEPT) 5 MG tablet Take 1 tablet (5 mg total) by mouth at bedtime. For memory issues 30 tablet 2  . DULoxetine (CYMBALTA) 30 MG capsule Take 1 capsule (30 mg total) by mouth daily. X 1 week then 60 mg daily- for nerve pain/back pain 60 capsule 5  . ferrous sulfate 325 (65 FE) MG tablet Take 325 mg by mouth daily.    [name] gabapentin (NEURONTIN) 300 MG capsule TAKE 1 CAPSULE BY MOUTH IN THE MORNING AND LUNCH, AND 2 AT BEDTIME 120 capsule 6  . glucose blood (ONETOUCH VERIO) test strip USE TO TEST BLOOD SUGAR THREE TIMES DAILY E11.2 100 strip 2  . hydrOXYzine (ATARAX/VISTARIL) 25 MG tablet Take 1 tablet (25 mg total) by mouth 3 (three) times daily as needed for itching (rash). 30 tablet 0  . insulin aspart protamine - aspart (NOVOLOG 70/30 MIX) (70-30) 100 UNIT/ML FlexPen Inject 0.06 mLs (6 Units total) into the skin 2 (two) times daily with a meal. 15 mL 6  . ixazomib citrate (NINLARO) 3 MG capsule Take 1 capsule (3 mg) by mouth weekly, 3 weeks on, 1 week off, repeat every 4 weeks. Take on an empty stomach 1hr before or 2hr after meals. 3 capsule 3  . lidocaine (LIDODERM) 5 %     . metaxalone (SKELAXIN) 800 MG tablet TAKE 1 TABLET (800 MG TOTAL) BY MOUTH AT BEDTIME AS NEEDED FOR MUSCLE SPASMS. 30 tablet 3  . Multiple Vitamin (MULTIVITAMIN WITH MINERALS) TABS tablet Take 1 tablet by mouth daily. 60 tablet 2  . OneTouch Delica Lancets 51V MISC Use to test blood sugar TID. E11.2 100 each 2  . pravastatin (PRAVACHOL) 20 MG tablet Take 1 tablet (20 mg total) by mouth daily. 90 tablet 1  . tadalafil (CIALIS) 20 MG tablet Take 20 mg by mouth as needed.    . TRUEPLUS 5-BEVEL PEN NEEDLES 32G X 4 MM MISC USE AS DIRECTED TWICE DAILY WITH INSULIN 100 each 0  .  vitamin B-12 (CYANOCOBALAMIN) 100 MCG tablet Take 100 mcg by mouth daily.    .  Vitamins/Minerals TABS Take by mouth.     No current facility-administered medications for this visit.    REVIEW OF SYSTEMS:   A 10+ POINT REVIEW OF SYSTEMS WAS OBTAINED including neurology, dermatology, psychiatry, cardiac, respiratory, lymph, extremities, GI, GU, Musculoskeletal, constitutional, breasts, reproductive, HEENT.  All pertinent positives are noted in the HPI.  All others are negative.   PHYSICAL EXAMINATION: ECOG PERFORMANCE STATUS: 1 - Symptomatic but completely ambulatory Exam was given in a chair  .BP 130/68 (BP Location: Left Arm, Patient Position: Sitting)   Pulse 72   Temp 97.9 F (36.6 C) (Tympanic)   Resp 18   Ht 5' 6.5" (1.689 m)   Wt 161 lb 12.8 oz (73.4 kg)   SpO2 100%   BMI 25.72 kg/m    GENERAL:alert, in no acute distress and comfortable SKIN: no acute rashes, no significant lesions EYES: conjunctiva are pink and non-injected, sclera anicteric OROPHARYNX: MMM, no exudates, no oropharyngeal erythema or ulceration NECK: supple, no JVD LYMPH:  no palpable lymphadenopathy in the cervical, axillary or inguinal regions LUNGS: clear to auscultation b/l with normal respiratory effort HEART: regular rate & rhythm ABDOMEN:  normoactive bowel sounds , non tender, not distended. No palpable hepatosplenomegaly.  Extremity: no pedal edema PSYCH: alert & oriented x 3 with fluent speech NEURO: no focal motor/sensory deficits  LABORATORY DATA:  I have reviewed the data as listed  . CBC Latest Ref Rng & Units 05/19/2020 02/18/2020 11/17/2019  WBC 4.0 - 10.5 K/uL 6.2 7.7 6.1  Hemoglobin 13.0 - 17.0 g/dL 11.1(L) 11.5(L) 10.8(L)  Hematocrit 39 - 52 % 32.7(L) 33.6(L) 32.1(L)  Platelets 150 - 400 K/uL 196 205 207    . CMP Latest Ref Rng & Units 05/19/2020 05/04/2020 11/17/2019  Glucose 70 - 99 mg/dL 161(H) 109(H) 141(H)  BUN 8 - 23 mg/dL '22 20 17  ' Creatinine 0.61 - 1.24 mg/dL 1.54(H) 1.46(H) 1.59(H)  Sodium 135 - 145 mmol/L 137 141 142  Potassium 3.5 - 5.1 mmol/L  3.9 4.0 3.8  Chloride 98 - 111 mmol/L 103 107(H) 109  CO2 22 - 32 mmol/L '26 20 24  ' Calcium 8.9 - 10.3 mg/dL 9.7 9.9 9.1  Total Protein 6.5 - 8.1 g/dL 7.3 - 6.8  Total Bilirubin 0.3 - 1.2 mg/dL 0.7 - 0.4  Alkaline Phos 38 - 126 U/L 73 - 70  AST 15 - 41 U/L 30 - 23  ALT 0 - 44 U/L 26 - 21   08/29/17 BM Bx:    08/29/17 Cytogenetics:   03/13/17 Cytogenetics:          RADIOGRAPHIC STUDIES: I have personally reviewed the radiological images as listed and agreed with the findings in the report. No results found.  ASSESSMENT & PLAN:   74 y.o. male with   1. IgA Lambda Multiple Myeloma ISS Stage I -Currently in remission  Active disease was previously diagnosed based on presence of anemia, kidney insufficiency, and paraproteinemia with significant predominance of lambda light chains as well as significant elevation of IgA.  PLAN:  -Discussed pt labwork today, 05/19/20; blood counts and chemistries are stable, K/L light chains- wnl  & MMP - no M spike, IFE +ve for IgA lambda monoclonal protein.  -No lab or clinical indication of Multiple Myeloma progression/recurrence at this time. Will continue watchful observation.  -Will continue to hold maintenance Ninlaro due to significant side effects (rash/neuropathy)- will have low threshold to  restart if signs of progression. -Continue B-complex and Vitamin D daily. -Will see back in 3 months with labs   FOLLOW UP: RTC with Dr Irene Limbo with labs in 3 months    The total time spent in the appt was 20 minutes and more than 50% was on counseling and direct patient cares.  All of the patient's questions were answered with apparent satisfaction. The patient knows to call the clinic with any problems, questions or concerns.   Sullivan Lone MD [place] AAHIVMS [facility] Hematology/Oncology Physician [facility]  (Office):       [phone] (Work cell):  [phone] (Fax):           [phone]  05/19/2020 3:23 PM  I,  Yevette Edwards, am acting as a scribe for Dr. Sullivan Lone.   .I have reviewed the above documentation for accuracy and completeness, and I agree with the above. Brunetta Genera MD

## 2020-05-19 NOTE — Telephone Encounter (Signed)
Scheduled per 11/10 los. Printed avs and calendar for pt.  

## 2020-05-20 LAB — KAPPA/LAMBDA LIGHT CHAINS
Kappa free light chain: 52.7 mg/L — ABNORMAL HIGH (ref 3.3–19.4)
Kappa, lambda light chain ratio: 1.59 (ref 0.26–1.65)
Lambda free light chains: 33.1 mg/L — ABNORMAL HIGH (ref 5.7–26.3)

## 2020-05-21 LAB — MULTIPLE MYELOMA PANEL, SERUM
Albumin SerPl Elph-Mcnc: 3.8 g/dL (ref 2.9–4.4)
Albumin/Glob SerPl: 1.4 (ref 0.7–1.7)
Alpha 1: 0.2 g/dL (ref 0.0–0.4)
Alpha2 Glob SerPl Elph-Mcnc: 0.8 g/dL (ref 0.4–1.0)
B-Globulin SerPl Elph-Mcnc: 0.9 g/dL (ref 0.7–1.3)
Gamma Glob SerPl Elph-Mcnc: 1 g/dL (ref 0.4–1.8)
Globulin, Total: 2.9 g/dL (ref 2.2–3.9)
IgA: 184 mg/dL (ref 61–437)
IgG (Immunoglobin G), Serum: 916 mg/dL (ref 603–1613)
IgM (Immunoglobulin M), Srm: 98 mg/dL (ref 15–143)
Total Protein ELP: 6.7 g/dL (ref 6.0–8.5)

## 2020-05-24 MED FILL — NOVOLOG MIX 70-30 FLEXPEN S: (70-30) 100 | 25 days supply | Qty: 3 | Fill #13

## 2020-05-25 ENCOUNTER — Ambulatory Visit: Payer: Medicare HMO | Attending: Family | Admitting: Family

## 2020-05-25 ENCOUNTER — Other Ambulatory Visit: Payer: Self-pay

## 2020-05-25 VITALS — BP 124/69 | HR 68 | Temp 98.2°F | Resp 18 | Ht 67.0 in | Wt 162.0 lb

## 2020-05-25 DIAGNOSIS — R413 Other amnesia: Secondary | ICD-10-CM

## 2020-05-25 DIAGNOSIS — Z0000 Encounter for general adult medical examination without abnormal findings: Secondary | ICD-10-CM

## 2020-05-25 MED FILL — GABAPENTIN 300 MG CAPSULE: 300 | 30 days supply | Qty: 120 | Fill #3

## 2020-05-25 NOTE — Progress Notes (Signed)
Subjective:   Robert Sparks is a 74 y.o. male who presents for an Initial Medicare Annual Wellness Visit.  Review of Systems    Cardiac Risk Factors include: advanced age (>45mn, >>25women);diabetes mellitus  Objective:    Today's Vitals   05/25/20 0846  BP: 124/69  Pulse: 68  Resp: 18  Temp: 98.2 F (36.8 C)  TempSrc: Oral  SpO2: 99%  Weight: 162 lb (73.5 kg)  Height: _0  (1.702 m)  PainSc: 0-No pain   Body mass index is 25.37 kg/m.  Advanced Directives 05/25/2020 08/18/2019 07/16/2018 06/10/2018 05/02/2018 04/08/2018 02/08/2018  Does Patient Have a Medical Advance Directive? _1  No No  Would patient like information on creating a medical advance directive? No - Patient declined No - Patient declined No - Patient declined No - Patient declined No - Patient declined No - Patient declined No - Patient declined  Pre-existing out of facility DNR order (yellow form or pink MOST form) - - - - - - -  - Discussed with patient what is a MSoil scientist  I went over with him Living Will and HWarren  Patient given written packet also.  He will look over it and consider executing a Living Will.  If he does, I requested that he brings a copy of it for our records.  Current Medications (verified) Outpatient Encounter Medications as of 05/25/2020  Medication Sig  . acyclovir (ZOVIRAX) 400 MG tablet TAKE 1 TABLET (400 MG TOTAL) BY MOUTH 2 (TWO) TIMES DAILY.  .Marland KitchenamLODipine (NORVASC) 10 MG tablet TAKE 1 TABLET (10 MG TOTAL) BY MOUTH DAILY.  .Marland Kitchenaspirin EC 81 MG tablet Take 1 tablet (81 mg total) by mouth daily.  .Marland Kitchenb complex vitamins capsule Take 1 capsule by mouth daily.  . Blood Glucose Monitoring Suppl (ONETOUCH VERIO) w/Device KIT Use to test blood sugar TID. E11.2  . buPROPion (WELLBUTRIN SR) 150 MG 12 hr tablet   . diclofenac Sodium (VOLTAREN) 1 % GEL Apply 4 g topically 4 (four) times daily.  .Marland Kitchendonepezil (ARICEPT) 5 MG tablet Take 1 tablet (5  mg total) by mouth at bedtime. For memory issues  . DULoxetine (CYMBALTA) 30 MG capsule Take 1 capsule (30 mg total) by mouth daily. X 1 week then 60 mg daily- for nerve pain/back pain  . ferrous sulfate 325 (65 FE) MG tablet Take 325 mg by mouth daily.  .Marland Kitchengabapentin (NEURONTIN) 300 MG capsule TAKE 1 CAPSULE BY MOUTH IN THE MORNING AND LUNCH, AND 2 AT BEDTIME  . glucose blood (ONETOUCH VERIO) test strip USE TO TEST BLOOD SUGAR THREE TIMES DAILY E11.2  . hydrOXYzine (ATARAX/VISTARIL) 25 MG tablet Take 1 tablet (25 mg total) by mouth 3 (three) times daily as needed for itching (rash).  . insulin aspart protamine - aspart (NOVOLOG 70/30 MIX) (70-30) 100 UNIT/ML FlexPen Inject 0.06 mLs (6 Units total) into the skin 2 (two) times daily with a meal.  . ixazomib citrate (NINLARO) 3 MG capsule Take 1 capsule (3 mg) by mouth weekly, 3 weeks on, 1 week off, repeat every 4 weeks. Take on an empty stomach 1hr before or 2hr after meals.  . lidocaine (LIDODERM) 5 %   . metaxalone (SKELAXIN) 800 MG tablet TAKE 1 TABLET (800 MG TOTAL) BY MOUTH AT BEDTIME AS NEEDED FOR MUSCLE SPASMS.  . Multiple Vitamin (MULTIVITAMIN WITH MINERALS) TABS tablet Take 1 tablet by mouth daily.  .Glory RosebushDelica Lancets 363KMISC  Use to test blood sugar TID. E11.2  . pravastatin (PRAVACHOL) 20 MG tablet Take 1 tablet (20 mg total) by mouth daily.  . tadalafil (CIALIS) 20 MG tablet Take 20 mg by mouth as needed.  . TRUEPLUS 5-BEVEL PEN NEEDLES 32G X 4 MM MISC USE AS DIRECTED TWICE DAILY WITH INSULIN  . vitamin B-12 (CYANOCOBALAMIN) 100 MCG tablet Take 100 mcg by mouth daily.  . Vitamins/Minerals TABS Take by mouth.   No facility-administered encounter medications on file as of 05/25/2020.    Allergies (verified) Other   History: Past Medical History:  Diagnosis Date  . Anemia   . Arthritis    shoulders,feet   . Cataract    per eye dr -has appt 5-11  . CKD (chronic kidney disease), stage III ([place])   . Elevated PSA   .  History of ketoacidosis    03-29-2015   . History of sepsis    07-25-2013  non-compliant w/ medication  . Hyperlipidemia   . Hypertension   . Nocturia   . Peripheral neuropathy   . Prostate cancer ([place])   . Type 2 diabetes mellitus with insulin therapy [facility])    Past Surgical History:  Procedure Laterality Date  . CIRCUMCISION  1990's  . PROSTATE BIOPSY N/A 12/21/2015   Procedure: BIOPSY TRANSRECTAL ULTRASONIC PROSTATE (TUBP);  Surgeon: Festus Aloe, MD;  Location[facility];  Service: Urology;  Laterality: N/A;   Family History  Problem Relation Age of Onset  . Kidney disease Mother   . Cancer Neg Hx   . Colon cancer Neg Hx   . Colon polyps Neg Hx   . Esophageal cancer Neg Hx   . Rectal cancer Neg Hx   . Stomach cancer Neg Hx    Social History   Socioeconomic History  . Marital status: Single    Spouse name: Not on file  . Number of children: Not on file  . Years of education: Not on file  . Highest education level: Not on file  Occupational History  . Not on file  Tobacco Use  . Smoking status: Current Every Day Smoker    Packs/day: 0.50    Years: 50.00    Pack years: 25.00    Types: Cigarettes  . Smokeless tobacco: Never Used  . Tobacco comment: 1 ppwk-4-5 a day   Vaping Use  . Vaping Use: Never used  Substance and Sexual Activity  . Alcohol use: Yes    Alcohol/week: 1.0 standard drink    Types: 1 Cans of beer per week    Comment: 1 every day-quit couple weeks ago  . Drug use: No  . Sexual activity: Not Currently  Other Topics Concern  . Not on file  Social History Narrative  . Not on file   Social Determinants of Health   Financial Resource Strain:   . Difficulty of Paying Living Expenses: Not on file  Food Insecurity:   . Worried About Charity fundraiser in the Last Year: Not on file  . Ran Out of Food in the Last Year: Not on file  Transportation Needs:   . Lack of Transportation (Medical): Not on file  . Lack of  Transportation (Non-Medical): Not on file  Physical Activity:   . Days of Exercise per Week: Not on file  . Minutes of Exercise per Session: Not on file  Stress:   . Feeling of Stress : Not on file  Social Connections:   . Frequency of Communication with Friends  and Family: Not on file  . Frequency of Social Gatherings with Friends and Family: Not on file  . Attends Religious Services: Not on file  . Active Member of Clubs or Organizations: Not on file  . Attends Archivist Meetings: Not on file  . Marital Status: Not on file    Tobacco Counseling Ready to quit: Not Answered Counseling given: Not Answered Comment: 1 ppwk-4-5 a day  Not ready to quit.  Clinical Intake:  Pre-visit preparation completed: Yes  Pain : No/denies pain Pain Score: 0-No pain  Diabetes: Yes CBG done?: Yes CBG resulted in Enter/ Edit results?: No Did pt. bring in CBG monitor from home?: No  Diabetic? Yes, last visit with primary physician 05/04/2020.  Activities of Daily Living In your present state of health, do you have any difficulty performing the following activities: 05/25/2020  Hearing? N  Vision? N  Difficulty concentrating or making decisions? Y  Comment Memory  Walking or climbing stairs? N  Dressing or bathing? N  Doing errands, shopping? N  Preparing Food and eating ? N  Using the Toilet? N  In the past six months, have you accidently leaked urine? N  Do you have problems with loss of bowel control? N  Managing your Medications? Y  Managing your Finances? Y  Housekeeping or managing your Housekeeping? Y  Some recent data might be hidden  - Does have challenges with memory. Says he forgets random things. Still taking Aricept as prescribed, not sure if it is helping. - Denies challenges with managing medications, finances, and housekeeping.  Patient Care Team: [name], MD as PCP - General (Internal Medicine) [name], MD (Inactive) as Consulting  Physician (Hematology and Oncology) Acquanetta Sit, MD as Consulting Physician (Podiatry) Clent Jacks, MD as Consulting Physician (Ophthalmology) [facility], not sure of doctor's name. Indicate any recent Medical Services you may have received from other than [facility] providers in the past year (date may be approximate).     Assessment:   This is a routine wellness examination for [facility].  Hearing/Vision screen  Hearing Screening   _0  _1  _2  _3  _4  _5  _6  _7  _8   Right ear:           Left ear:             Visual Acuity Screening   Right eye Left eye Both eyes  Without correction: 20/30-1 20/50   With correction:     - Vision exam this year per patient report. - Whisper Test normal bilateral.   Dietary issues and exercise activities discussed: Current Exercise Habits: Home exercise routine, Type of exercise: walking, Time (Minutes): 15, Frequency (Times/Week): 7, Weekly Exercise (Minutes/Week): 105, Intensity: Mild, Exercise limited by: None identified  Dietary issues - reports he eats what he wants and needs to be more mindful of this.  Goals   None   " I would like to improve diabetes."  Depression Screen PHQ 2/9 Scores 05/04/2020 04/25/2019 08/27/2018 06/20/2018 03/21/2018 01/07/2018 12/06/2017  PHQ - 2 Score 0 0 0 0 1 0 1  PHQ- 9 Score - - 0 - - - -    Fall Risk Fall Risk  05/25/2020 05/04/2020 07/25/2019 06/13/2019 03/14/2019  Falls in the past year? 0 0 0 0 0  Number falls in past yr: 0 0 - - -  Injury with Fall? 0 0 - - -  Risk Factor Category  - - - - -  Risk for fall due to : - - - - -  Follow up Falls evaluation completed - - - -   Any stairs in or around the home? Yes  If so, are there any without handrails? No  Home free of loose throw rugs in walkways, pet beds, electrical cords, etc? has a rug but being careful Adequate lighting in your home to reduce risk of falls? Yes   ASSISTIVE DEVICES UTILIZED TO PREVENT FALLS:  Life  alert? No  Use of a cane, walker or w/c? No  Grab bars in the bathroom? No  Shower chair or bench in shower? Yes  Elevated toilet seat or a handicapped toilet? No   TIMED UP AND GO: Was the test performed? Yes .  Length of time to ambulate 10 feet: 12 sec.   Gait steady and fast without use of assistive device  Cognitive Function: MMSE - Mini Mental State Exam 05/25/2020 04/25/2019  Orientation to time 5 5  Orientation to Place 5 5  Registration 2 3  Attention/ Calculation 0 0  Recall 3 0  Language- name 2 objects 2 2  Language- repeat 1 1  Language- follow 3 step command 3 3  Language- read & follow direction 1 1  Write a sentence 1 1  Copy design 1 1  Total score 24 22  - MMSE has evidence of some memory impairment but slightly improved since last visit. Will recheck at next visit with primary physician.  Immunizations Immunization History  Administered Date(s) Administered  . Influenza, High Dose Seasonal PF 04/08/2018  . Influenza,inj,Quad PF,6+ Mos 02/23/2016  . Influenza-Unspecified 04/28/2020  . PFIZER SARS-COV-2 Vaccination 09/05/2019, 10/01/2019  . Pneumococcal Conjugate-13 06/20/2018  . Pneumococcal Polysaccharide-23 02/23/2016  . Tdap 06/21/2016    TDAP status: Up to date Flu Vaccine status: Up to date Pneumococcal vaccine status: Up to date Covid-19 vaccine status: Completed vaccines  Qualifies for Shingles Vaccine? Yes   Shingrix Completed?: No.    Education has been provided regarding the importance of this vaccine. Patient has been advised to call insurance company to determine out of pocket expense if they have not yet received this vaccine. Advised may also receive vaccine at local pharmacy or Health Dept. Verbalized acceptance and understanding.  Screening Tests Health Maintenance  Topic Date Due  . HEMOGLOBIN A1C  11/02/2020  . OPHTHALMOLOGY EXAM  01/26/2021  . FOOT EXAM  05/04/2021  . URINE MICROALBUMIN  05/04/2021  . TETANUS/TDAP  06/21/2026   . COLONOSCOPY  11/29/2026  . INFLUENZA VACCINE  Completed  . COVID-19 Vaccine  Completed  . Hepatitis C Screening  Completed  . PNA vac Low Risk Adult  Completed    Health Maintenance  There are no preventive care reminders to display for this patient.  Colorectal cancer screening: Completed 11/28/2016. Repeat every 10 years  Lung Cancer Screening: (Low Dose CT Chest recommended if Age 49-80 years, 30 pack-year currently smoking OR have quit w/in 15years.) does not qualify.   Lung Cancer Screening Referral: not applicable  Additional Screening:  Hepatitis C Screening: does qualify; Completed 06/21/2016  Vision Screening: Recommended annual ophthalmology exams for early detection of glaucoma and other disorders of the eye. Is the patient up to date with their annual eye exam?  Yes  Who is the provider or what is the name of the office in which the patient attends annual eye exams? Dr. Katy Fitch. Today reports he has cataract(s) and not sure which eye. Does wear glasses. If pt is not established with a provider, would they like to be referred to a  provider to establish care? not applicable.   Dental Screening: Recommended annual dental exams for proper oral hygiene Has a dentist [place] Resource Referral / Chronic Care Management: CRR required this visit?  No   CCM required this visit?  No      Plan:  1. Medicare annual wellness visit, initial: - Patient presents for initial Medicare annual wellness visit. No new issues or concerns. - Please keep all appointments with primary physician.  2. Memory changes: - Mini Mental Status Exam has evidence of some memory impairment and likely related to early dementia. However, score has slightly improved this visit at 24/30 in comparison to last visit on 05/04/2020 when score was 22/30. - Taking Aricept but not sure if this is helping. - Continue Aricept as prescribed.  Will recheck at next visit with primary  physician.  I have personally reviewed and noted the following in the patient's chart:   . Medical and social history . Use of alcohol, tobacco or illicit drugs  . Current medications and supplements . Functional ability and status . Nutritional status . Physical activity . Advanced directives . List of other physicians . Hospitalizations, surgeries, and ER visits in previous 12 months . Vitals . Screenings to include cognitive, depression, and falls . Referrals and appointments  In addition, I have reviewed and discussed with patient certain preventive protocols, quality metrics, and best practice recommendations. A written personalized care plan for preventive services as well as general preventive health recommendations were provided to patient.     Camillia Herter, NP   05/25/2020

## 2020-05-25 NOTE — Patient Instructions (Signed)
  Robert Sparks , Thank you for taking time to come for your Medicare Wellness Visit. I appreciate your ongoing commitment to your health goals. Please review the following plan we discussed and let me know if I can assist you in the future.   These are the goals we discussed: Goals   None    " Improve diabetes. " This is a list of the screening recommended for you and due dates:  Health Maintenance  Topic Date Due  . Hemoglobin A1C  11/02/2020  . Eye exam for diabetics  01/26/2021  . Complete foot exam   05/04/2021  . Urine Protein Check  05/04/2021  . Tetanus Vaccine  06/21/2026  . Colon Cancer Screening  11/29/2026  . Flu Shot  Completed  . COVID-19 Vaccine  Completed  .  Hepatitis C: One time screening is recommended by Center for Disease Control  (CDC) for  adults born from [birth year] through 1963.   Completed  . Pneumonia vaccines  Completed

## 2020-05-25 NOTE — Progress Notes (Signed)
Patient denies pain at this time. Patient has taken medication today and patient has eaten today. Patient complains of memory loss and medication not assisting. Patient complains of constipation with last BM being this morning and 2 days prior to that. Patient CBG was 124 this morning after eating at home.

## 2020-05-31 ENCOUNTER — Other Ambulatory Visit: Payer: Self-pay | Admitting: Hematology

## 2020-06-02 MED FILL — hydrOXYzine HCL 25 MG TABS: 25 | 10 days supply | Qty: 30 | Fill #0

## 2020-06-08 ENCOUNTER — Other Ambulatory Visit: Payer: Self-pay | Admitting: Internal Medicine

## 2020-06-08 DIAGNOSIS — I10 Essential (primary) hypertension: Secondary | ICD-10-CM

## 2020-06-08 MED FILL — AMLODIPINE BESYLATE 10 MG T: 10 | 90 days supply | Qty: 90 | Fill #0

## 2020-06-09 ENCOUNTER — Other Ambulatory Visit: Payer: Self-pay | Admitting: Internal Medicine

## 2020-06-09 ENCOUNTER — Telehealth: Payer: Self-pay | Admitting: Internal Medicine

## 2020-06-09 DIAGNOSIS — E1122 Type 2 diabetes mellitus with diabetic chronic kidney disease: Secondary | ICD-10-CM

## 2020-06-09 DIAGNOSIS — N183 Chronic kidney disease, stage 3 unspecified: Secondary | ICD-10-CM

## 2020-06-09 DIAGNOSIS — I10 Essential (primary) hypertension: Secondary | ICD-10-CM

## 2020-06-09 MED ORDER — AMLODIPINE BESYLATE 10 MG PO TABS: 10.0000 mg | ORAL_TABLET | Freq: Every day | ORAL | 6 refills | Status: DC

## 2020-06-09 NOTE — Telephone Encounter (Signed)
Contacted pt to go over [name] message pt states he was told by the pharmacy that it was time to fill. Pt states he will come by tomorrow to pick up medications

## 2020-06-14 ENCOUNTER — Other Ambulatory Visit: Payer: Self-pay | Admitting: General Practice

## 2020-06-14 MED FILL — IBUPROFEN 800 MG TABLET: 800 | 5 days supply | Qty: 15 | Fill #0

## 2020-06-14 MED FILL — METAXALONE 800 MG TABLET: 800 | 30 days supply | Qty: 30 | Fill #2

## 2020-06-14 MED FILL — ONE TOUCH VERIO TEST STRIP: 33 days supply | Qty: 100 | Fill #0

## 2020-06-15 MED FILL — NOVOLOG MIX 70-30 FLEXPEN S: (70-30) 100 | 25 days supply | Qty: 3 | Fill #14

## 2020-06-28 MED FILL — GABAPENTIN 300 MG CAPSULE: 300 | 30 days supply | Qty: 120 | Fill #4

## 2020-06-28 MED FILL — ACYCLOVIR 400 MG TABLET: 400 | 30 days supply | Qty: 60 | Fill #3

## 2020-06-30 ENCOUNTER — Other Ambulatory Visit: Payer: Self-pay | Admitting: Internal Medicine

## 2020-06-30 MED FILL — TRUEplus 5-BEVEL PEN NEEDLE: 32G X 4 MM | 50 days supply | Qty: 100 | Fill #0

## 2020-06-30 NOTE — Telephone Encounter (Signed)
Requested Prescriptions  Pending Prescriptions Disp Refills  . TRUEPLUS 5-BEVEL PEN NEEDLES 32G X 4 MM MISC [Pharmacy Med Name: TRUEplus 5-BEVEL PEN NEEDLE 32G X 4 MM Miscellaneous] 100 each 6    Sig: USE AS DIRECTED TWICE DAILY WITH INSULIN     Endocrinology: Diabetes - Testing Supplies Passed - 06/30/2020  9:46 AM      Passed - Valid encounter within last 12 months    Recent Outpatient Visits          1 month ago Medicare annual wellness visit, initial   [place], Amy J, NP   1 month ago Type 2 diabetes mellitus with peripheral neuropathy ([place])   [place], Neoma Laming B, MD   7 months ago Controlled type 2 diabetes mellitus with stage 3 chronic kidney disease, with long-term current use of insulin ([place])   [place], MD   11 months ago Appointment canceled by hospital   [place], Deborah B, MD   1 year ago Type 2 diabetes mellitus with diabetic polyneuropathy, with long-term current use of insulin [facility])   [place], MD      Future Appointments            In 2 months Wynetta Emery Dalbert Batman, MD [place]

## 2020-07-06 MED FILL — NOVOLOG MIX 70-30 FLEXPEN S: (70-30) 100 | 25 days supply | Qty: 3 | Fill #15

## 2020-07-09 MED FILL — ONE TOUCH VERIO TEST STRIP: 33 days supply | Qty: 100 | Fill #1

## 2020-07-13 DIAGNOSIS — Z20822 Contact with and (suspected) exposure to covid-19: Secondary | ICD-10-CM | POA: Diagnosis not present

## 2020-07-15 DIAGNOSIS — Z20822 Contact with and (suspected) exposure to covid-19: Secondary | ICD-10-CM | POA: Diagnosis not present

## 2020-07-15 DIAGNOSIS — Z1152 Encounter for screening for COVID-19: Secondary | ICD-10-CM | POA: Diagnosis not present

## 2020-07-26 ENCOUNTER — Other Ambulatory Visit: Payer: Self-pay | Admitting: Internal Medicine

## 2020-07-26 DIAGNOSIS — N183 Chronic kidney disease, stage 3 unspecified: Secondary | ICD-10-CM

## 2020-07-26 DIAGNOSIS — E1122 Type 2 diabetes mellitus with diabetic chronic kidney disease: Secondary | ICD-10-CM

## 2020-07-26 MED FILL — PRAVASTATIN SODIUM 20 MG TA: 20 | 90 days supply | Qty: 90 | Fill #0

## 2020-07-26 MED FILL — NOVOLOG MIX 70-30 FLEXPEN S: (70-30) 100 | 25 days supply | Qty: 3 | Fill #0

## 2020-08-02 MED FILL — GABAPENTIN 300 MG CAPSULE: 300 | 30 days supply | Qty: 120 | Fill #5

## 2020-08-05 MED FILL — ONE TOUCH VERIO TEST STRIP: 33 days supply | Qty: 100 | Fill #2

## 2020-08-17 MED FILL — NOVOLOG MIX 70-30 FLEXPEN S: (70-30) 100 | 25 days supply | Qty: 3 | Fill #1

## 2020-08-17 MED FILL — METAXALONE 800 MG TABLET: 800 | 30 days supply | Qty: 30 | Fill #3

## 2020-08-18 NOTE — Progress Notes (Signed)
HEMATOLOGY/ONCOLOGY CLINIC NOTE  Date of Service: 08/18/2020  Patient Care Team: [name], MD as PCP - General (Internal Medicine) [name], MD (Inactive) as Consulting Physician (Hematology and Oncology)  CHIEF COMPLAINTS/PURPOSE OF CONSULTATION:  F/u for continued mx of Multiple Myeloma  Oncologic History:  75 y.o. male with diagnosis of IgA lambda active multiple myeloma, ISS Stage I. Active disease diagnosed based on presence of anemia, kidney insufficiency, and paraproteinemia with significant predominance of lambda light chains as well as significant elevation of IgA. Bone marrow biopsy confirmed presence of atypical monoclonal plasma cell process in the bone marrow comprising at least 7% of the cellularity. Based on the findings, patient was started on treatment with lenalidomide, bortezomib, and low-dose dexamethasone based on the anticipated tolerance by the patient. Lenalidomide was started at 10 mg daily based on creatinine clearance of 42, dexamethasone dose was reduced to 20 mg weekly based on patient's age. Treatment was complicated by rapid development of cutaneous rash attributed to lenalidomide, inaddition to decreased renal function and now patient has persistent severe anemia. Side-effects were attributed to Lenalidomide and lenalidomide was discontinued with subsequent recovery.  Patient has been receiving bortezomib weekly with low-dose dexamethasone in 4-day cycles.  Patient has completed induction systemic therapy for his disease with repeat bone marrow biopsy confirming complete response including no evidence of minimal residual disease by cytogenetics or FISH.  HISTORY OF PRESENTING ILLNESS:   Robert Sparks is a wonderful 75 y.o. male who has been referred to [place] by my colleague Dr Grace Isaac for evaluation and management of Multiple Myeloma. The pt reports that he is doing well overall.   The pt has been previously followed by my colleague  Dr Grace Isaac. The pt was treated with Revlimid, Velcade, and dexamethasone and was subsequently switched to Velcade and Dexamethasone after a Revlimid intolerance.   The pt reports some knee aches and skin soreness in his legs which is worse at night. He notes that he has had diabetic-related peripheral neuropathy. The pt has not seen [facility] yet for a discussion of a BM transplant. He continues taking Acyclovir and notes no difficulty taking maintenance Velcade.   He adds that he takes Gabapentin and sweats.  He notes that his neuropathy is currently stable and denies it worsening.   He notes that a pack of cigarettes lasts him for about 3 days.   Most recent lab results (12/14/17) of CBC and CMP  is as follows: all values are WNL except for RBC at 3.08, HGB at 10.1, HCT at 31.0, MCV at 100.6, Eosinophils Abs at 1.4k, Potassium at 3.4, Glucose at 156, Creatinine at 1.60. LDH 12/14/17 is 141 Beta 2 microglobulin 12/14/17 is 2.4  On review of systems, pt reports knee pain, night time LE skin soreness, peripheral neuropathy, eating well, strong appetite, intermittent sweating and denies fevers, chills, back pains, abdominal pains, and any other symptoms.   [name] DM type 2, HTN, CKD. On Social Hx the pt reports smoking a pack of cigarettes every 3 days.   [name] returns today regarding management and evaluation of his Multiple Myeloma. [name] was on 05/19/2020. The pt reports that he is doing well overall.  The pt reports no new concerns or symptoms. He notes that he sometimes has difficulty falling asleep. The pt does experience some numbness in his toes, but not hands. This has improved since discontinuing  the maintenance Ninlaro. The pt notes that he has degenerative discs in his back that he is currently on medication for.  Lab results today 08/19/2020 of CBC w/diff and CMP is as follows: all values are WNL except  for RBC of 3.40, Hgb of 11.6, HCT of 33.6, MCH of 34.1, Eosinophils Absolute of 0.6K, Glucose of 181, BUN of 25, Creatinine of 1.63, GFR est of 44. 08/19/2020 Light Chains in progress. 08/19/2020 MMP no M spike  On review of systems, pt reports toe numbness and denies decreased appetite, fatigue, neuropathy, new lumps/bumps, unexpected weight loss, new bone pains, acute back pains, abdominal pain, leg swelling and any other symptoms.  MEDICAL HISTORY:  Past Medical History:  Diagnosis Date  . Anemia   . Arthritis    shoulders,feet   . Cataract    per eye dr -has appt 5-11  . CKD (chronic kidney disease), stage III ([place])   . Elevated PSA   . History of ketoacidosis    03-29-2015   . History of sepsis    07-25-2013  non-compliant w/ medication  . Hyperlipidemia   . Hypertension   . Nocturia   . Peripheral neuropathy   . Prostate cancer ([place])   . Type 2 diabetes mellitus with insulin therapy ([place])     SURGICAL HISTORY: Past Surgical History:  Procedure Laterality Date  . CIRCUMCISION  1990's  . PROSTATE BIOPSY N/A 12/21/2015   Procedure: BIOPSY TRANSRECTAL ULTRASONIC PROSTATE (TUBP);  Surgeon: Festus Aloe, MD;  [facility];  Service: Urology;  Laterality: N/A;    SOCIAL HISTORY: Social History   Socioeconomic History  . Marital status: Single    Spouse name: Not on file  . Number of children: Not on file  . Years of education: Not on file  . Highest education level: Not on file  Occupational History  . Not on file  Tobacco Use  . Smoking status: Current Every Day Smoker    Packs/day: 0.50    Years: 50.00    Pack years: 25.00    Types: Cigarettes  . Smokeless tobacco: Never Used  . Tobacco comment: 1 ppwk-4-5 a day   Vaping Use  . Vaping Use: Never used  Substance and Sexual Activity  . Alcohol use: Yes    Alcohol/week: 1.0 standard drink    Types: 1 Cans of beer per week    Comment: 1 every day-quit couple weeks ago  . Drug use:  No  . Sexual activity: Not Currently  Other Topics Concern  . Not on file  Social History Narrative  . Not on file   Social Determinants of Health   Financial Resource Strain: Not on file  Food Insecurity: Not on file  Transportation Needs: Not on file  Physical Activity: Not on file  Stress: Not on file  Social Connections: Not on file  Intimate Partner Violence: Not on file    FAMILY HISTORY: Family History  Problem Relation Age of Onset  . Kidney disease Mother   . Cancer Neg Hx   . Colon cancer Neg Hx   . Colon polyps Neg Hx   . Esophageal cancer Neg Hx   . Rectal cancer Neg Hx   . Stomach cancer Neg Hx     ALLERGIES:  is allergic to other.  MEDICATIONS:  Current Outpatient Medications  Medication Sig Dispense Refill  . acyclovir (ZOVIRAX) 400 MG tablet TAKE 1 TABLET (400 MG TOTAL) BY MOUTH 2 (TWO) TIMES DAILY. 60 tablet 5  .  amLODipine (NORVASC) 10 MG tablet Take 1 tablet (10 mg total) by mouth daily. 30 tablet 6  . aspirin EC 81 MG tablet Take 1 tablet (81 mg total) by mouth daily. 90 tablet 3  . b complex vitamins capsule Take 1 capsule by mouth daily. 30 capsule 5  . Blood Glucose Monitoring Suppl (ONETOUCH VERIO) w/Device KIT Use to test blood sugar TID. E11.2 1 kit 0  . buPROPion (WELLBUTRIN SR) 150 MG 12 hr tablet     . diclofenac Sodium (VOLTAREN) 1 % GEL Apply 4 g topically 4 (four) times daily. 150 g 5  . donepezil (ARICEPT) 5 MG tablet Take 1 tablet (5 mg total) by mouth at bedtime. For memory issues 30 tablet 2  . DULoxetine (CYMBALTA) 30 MG capsule Take 1 capsule (30 mg total) by mouth daily. X 1 week then 60 mg daily- for nerve pain/back pain 60 capsule 5  . ferrous sulfate 325 (65 FE) MG tablet Take 325 mg by mouth daily.    [name] gabapentin (NEURONTIN) 300 MG capsule TAKE 1 CAPSULE BY MOUTH IN THE MORNING AND LUNCH, AND 2 AT BEDTIME 120 capsule 6  . hydrOXYzine (ATARAX/VISTARIL) 25 MG tablet TAKE 1 TABLET (25 MG TOTAL) BY MOUTH 3 (THREE) TIMES DAILY AS  NEEDED FOR ITCHING (RASH). 30 tablet 0  . ixazomib citrate (NINLARO) 3 MG capsule Take 1 capsule (3 mg) by mouth weekly, 3 weeks on, 1 week off, repeat every 4 weeks. Take on an empty stomach 1hr before or 2hr after meals. 3 capsule 3  . lidocaine (LIDODERM) 5 %     . metaxalone (SKELAXIN) 800 MG tablet TAKE 1 TABLET (800 MG TOTAL) BY MOUTH AT BEDTIME AS NEEDED FOR MUSCLE SPASMS. 30 tablet 3  . Multiple Vitamin (MULTIVITAMIN WITH MINERALS) TABS tablet Take 1 tablet by mouth daily. 60 tablet 2  . NOVOLOG MIX 70/30 FLEXPEN (70-30) 100 UNIT/ML FlexPen INJECT 6 UNITS INTO THE SKIN 2 (TWO) TIMES DAILY WITH A MEAL. 3 mL 6  . OneTouch Delica Lancets 03U MISC Use to test blood sugar TID. E11.2 100 each 2  . ONETOUCH VERIO test strip USE TO TEST BLOOD SUGAR THREE TIMES DAILY E11.2 100 strip 2  . pravastatin (PRAVACHOL) 20 MG tablet Take 1 tablet (20 mg total) by mouth daily. 90 tablet 1  . tadalafil (CIALIS) 20 MG tablet Take 20 mg by mouth as needed.    . TRUEPLUS 5-BEVEL PEN NEEDLES 32G X 4 MM MISC USE AS DIRECTED TWICE DAILY WITH INSULIN 100 each 6  . vitamin B-12 (CYANOCOBALAMIN) 100 MCG tablet Take 100 mcg by mouth daily.    . Vitamins/Minerals TABS Take by mouth.     No current facility-administered medications for this visit.    REVIEW OF SYSTEMS:   10 Point review of Systems was done is negative except as noted above.  PHYSICAL EXAMINATION: ECOG PERFORMANCE STATUS: 1 - Symptomatic but completely ambulatory Exam was given in a chair  .BP 125/63 (BP Location: Right Arm, Patient Position: Sitting)   Pulse 71   Temp 97.9 F (36.6 C) (Tympanic)   Resp 18   Ht '5\' 7"'  (1.702 m)   Wt 165 lb 4.8 oz (75 kg)   SpO2 100%   BMI 25.89 kg/m     GENERAL:alert, in no acute distress and comfortable SKIN: no acute rashes, no significant lesions EYES: conjunctiva are pink and non-injected, sclera anicteric OROPHARYNX: MMM, no exudates, no oropharyngeal erythema or ulceration NECK: supple, no  JVD LYMPH:  no palpable lymphadenopathy in the cervical, axillary or inguinal regions LUNGS: clear to auscultation b/l with normal respiratory effort HEART: regular rate & rhythm ABDOMEN:  normoactive bowel sounds , non tender, not distended. Extremity: no pedal edema PSYCH: alert & oriented x 3 with fluent speech NEURO: no focal motor/sensory deficits  LABORATORY DATA:  I have reviewed the data as listed  . CBC Latest Ref Rng & Units 08/19/2020 05/19/2020 02/18/2020  WBC 4.0 - 10.5 K/uL 5.8 6.2 7.7  Hemoglobin 13.0 - 17.0 g/dL 11.6(L) 11.1(L) 11.5(L)  Hematocrit 39.0 - 52.0 % 33.6(L) 32.7(L) 33.6(L)  Platelets 150 - 400 K/uL 190 196 205    . CMP Latest Ref Rng & Units 08/19/2020 05/19/2020 05/04/2020  Glucose 70 - 99 mg/dL 181(H) 161(H) 109(H)  BUN 8 - 23 mg/dL 25(H) 22 20  Creatinine 0.61 - 1.24 mg/dL 1.63(H) 1.54(H) 1.46(H)  Sodium 135 - 145 mmol/L 136 137 141  Potassium 3.5 - 5.1 mmol/L 3.8 3.9 4.0  Chloride 98 - 111 mmol/L 105 103 107(H)  CO2 22 - 32 mmol/L '23 26 20  ' Calcium 8.9 - 10.3 mg/dL 9.2 9.7 9.9  Total Protein 6.5 - 8.1 g/dL 7.2 7.3 -  Total Bilirubin 0.3 - 1.2 mg/dL 0.6 0.7 -  Alkaline Phos 38 - 126 U/L 91 73 -  AST 15 - 41 U/L 23 30 -  ALT 0 - 44 U/L 26 26 -   08/29/17 BM Bx:    08/29/17 Cytogenetics:   03/13/17 Cytogenetics:          RADIOGRAPHIC STUDIES: I have personally reviewed the radiological images as listed and agreed with the findings in the report. No results found.  ASSESSMENT & PLAN:   75 y.o. male with   1. IgA Lambda Multiple Myeloma ISS Stage I -Currently in remission  Active disease was previously diagnosed based on presence of anemia, kidney insufficiency, and paraproteinemia with significant predominance of lambda light chains as well as significant elevation of IgA.  PLAN:  -Discussed pt labwork today, 08/19/2020; blood counts remain stable and chemistries stable given pt's chronic kidney disease. -Advised pt that his last  MMP showed no m-spike and light chins showed normal ratio. Will monitor labs sent out today. -Advised pt that we may have to restart Ninlaro if signs of progression on MMP labs sent out today. -No lab or clinical indication of Multiple Myeloma progression/recurrence at this time. Will continue watchful observation.  -Will continue to hold maintenance Ninlaro due to significant side effects (rash/neuropathy)- will have low threshold to restart if signs of progression. -Continue B-complex and Vitamin D daily. -Will see back in 3 months with labs. Will f/u after labs received in next few days.   FOLLOW UP: RTC with Dr Irene Limbo with labs in 3 months    The total time spent in the appointment was 20 minutes and more than 50% was on counseling and direct patient cares.  All of the patient's questions were answered with apparent satisfaction. The patient knows to call the clinic with any problems, questions or concerns.   Sullivan Lone MD [place] AAHIVMS [facility] Hematology/Oncology Physician [facility]  (Office):       [phone] (Work cell):  [phone] (Fax):           [phone]  08/18/2020 9:06 AM  I, Reinaldo Raddle, am acting as scribe for Dr. Sullivan Lone, MD.   .I have reviewed the above documentation for accuracy and completeness, and I agree with the above. Suzan Slick  Juleen China MD

## 2020-08-19 ENCOUNTER — Other Ambulatory Visit: Payer: Self-pay

## 2020-08-19 ENCOUNTER — Telehealth: Payer: Self-pay | Admitting: Hematology

## 2020-08-19 ENCOUNTER — Inpatient Hospital Stay: Payer: Medicare HMO | Attending: Hematology | Admitting: Hematology

## 2020-08-19 ENCOUNTER — Inpatient Hospital Stay: Payer: Medicare HMO

## 2020-08-19 VITALS — BP 125/63 | HR 71 | Temp 97.9°F | Resp 18 | Ht 67.0 in | Wt 165.3 lb

## 2020-08-19 DIAGNOSIS — C9001 Multiple myeloma in remission: Secondary | ICD-10-CM

## 2020-08-19 DIAGNOSIS — N189 Chronic kidney disease, unspecified: Secondary | ICD-10-CM | POA: Insufficient documentation

## 2020-08-19 LAB — CMP (CANCER CENTER ONLY)
ALT: 26 U/L (ref 0–44)
AST: 23 U/L (ref 15–41)
Albumin: 4.1 g/dL (ref 3.5–5.0)
Alkaline Phosphatase: 91 U/L (ref 38–126)
Anion gap: 8 (ref 5–15)
BUN: 25 mg/dL — ABNORMAL HIGH (ref 8–23)
CO2: 23 mmol/L (ref 22–32)
Calcium: 9.2 mg/dL (ref 8.9–10.3)
Chloride: 105 mmol/L (ref 98–111)
Creatinine: 1.63 mg/dL — ABNORMAL HIGH (ref 0.61–1.24)
GFR, Estimated: 44 mL/min — ABNORMAL LOW (ref >60)
Glucose, Bld: 181 mg/dL — ABNORMAL HIGH (ref 70–99)
Potassium: 3.8 mmol/L (ref 3.5–5.1)
Sodium: 136 mmol/L (ref 135–145)
Total Bilirubin: 0.6 mg/dL (ref 0.3–1.2)
Total Protein: 7.2 g/dL (ref 6.5–8.1)

## 2020-08-19 LAB — CBC WITH DIFFERENTIAL/PLATELET
Abs Immature Granulocytes: 0.01 10*3/uL (ref 0.00–0.07)
Basophils Absolute: 0 10*3/uL (ref 0.0–0.1)
Basophils Relative: 1 %
Eosinophils Absolute: 0.6 10*3/uL — ABNORMAL HIGH (ref 0.0–0.5)
Eosinophils Relative: 11 %
HCT: 33.6 % — ABNORMAL LOW (ref 39.0–52.0)
Hemoglobin: 11.6 g/dL — ABNORMAL LOW (ref 13.0–17.0)
Immature Granulocytes: 0 %
Lymphocytes Relative: 24 %
Lymphs Abs: 1.4 10*3/uL (ref 0.7–4.0)
MCH: 34.1 pg — ABNORMAL HIGH (ref 26.0–34.0)
MCHC: 34.5 g/dL (ref 30.0–36.0)
MCV: 98.8 fL (ref 80.0–100.0)
Monocytes Absolute: 0.3 10*3/uL (ref 0.1–1.0)
Monocytes Relative: 6 %
Neutro Abs: 3.4 10*3/uL (ref 1.7–7.7)
Neutrophils Relative %: 58 %
Platelets: 190 10*3/uL (ref 150–400)
RBC: 3.4 MIL/uL — ABNORMAL LOW (ref 4.22–5.81)
RDW: 13.2 % (ref 11.5–15.5)
WBC: 5.8 10*3/uL (ref 4.0–10.5)
nRBC: 0 % (ref 0.0–0.2)

## 2020-08-19 NOTE — Telephone Encounter (Signed)
Scheduled follow-up appointment per 2/10 los. Patient is aware. °

## 2020-08-20 ENCOUNTER — Encounter: Payer: Self-pay | Admitting: Podiatry

## 2020-08-20 ENCOUNTER — Other Ambulatory Visit: Payer: Self-pay

## 2020-08-20 ENCOUNTER — Ambulatory Visit (INDEPENDENT_AMBULATORY_CARE_PROVIDER_SITE_OTHER): Payer: Medicare HMO | Admitting: Podiatry

## 2020-08-20 DIAGNOSIS — M2011 Hallux valgus (acquired), right foot: Secondary | ICD-10-CM

## 2020-08-20 DIAGNOSIS — B353 Tinea pedis: Secondary | ICD-10-CM | POA: Diagnosis not present

## 2020-08-20 DIAGNOSIS — L84 Corns and callosities: Secondary | ICD-10-CM | POA: Diagnosis not present

## 2020-08-20 DIAGNOSIS — M79675 Pain in left toe(s): Secondary | ICD-10-CM

## 2020-08-20 DIAGNOSIS — E1142 Type 2 diabetes mellitus with diabetic polyneuropathy: Secondary | ICD-10-CM | POA: Diagnosis not present

## 2020-08-20 DIAGNOSIS — B351 Tinea unguium: Secondary | ICD-10-CM

## 2020-08-20 DIAGNOSIS — M79674 Pain in right toe(s): Secondary | ICD-10-CM

## 2020-08-20 LAB — KAPPA/LAMBDA LIGHT CHAINS
Kappa free light chain: 51.9 mg/L — ABNORMAL HIGH (ref 3.3–19.4)
Kappa, lambda light chain ratio: 1.2 (ref 0.26–1.65)
Lambda free light chains: 43.1 mg/L — ABNORMAL HIGH (ref 5.7–26.3)

## 2020-08-20 MED ORDER — ATHLETES FOOT POWDER SPRAY 2 % EX AERP: INHALATION_SPRAY | CUTANEOUS | 4 refills | Status: DC

## 2020-08-20 NOTE — Patient Instructions (Signed)

## 2020-08-23 LAB — MULTIPLE MYELOMA PANEL, SERUM
Albumin SerPl Elph-Mcnc: 4.1 g/dL (ref 2.9–4.4)
Albumin/Glob SerPl: 1.6 (ref 0.7–1.7)
Alpha 1: 0.2 g/dL (ref 0.0–0.4)
Alpha2 Glob SerPl Elph-Mcnc: 0.7 g/dL (ref 0.4–1.0)
B-Globulin SerPl Elph-Mcnc: 0.9 g/dL (ref 0.7–1.3)
Gamma Glob SerPl Elph-Mcnc: 0.8 g/dL (ref 0.4–1.8)
Globulin, Total: 2.6 g/dL (ref 2.2–3.9)
IgA: 221 mg/dL (ref 61–437)
IgG (Immunoglobin G), Serum: 876 mg/dL (ref 603–1613)
IgM (Immunoglobulin M), Srm: 98 mg/dL (ref 15–143)
Total Protein ELP: 6.7 g/dL (ref 6.0–8.5)

## 2020-08-23 MED FILL — ACYCLOVIR 400 MG TABLET: 400 | 30 days supply | Qty: 60 | Fill #4

## 2020-08-24 ENCOUNTER — Other Ambulatory Visit: Payer: Self-pay | Admitting: Internal Medicine

## 2020-08-24 DIAGNOSIS — Z794 Long term (current) use of insulin: Secondary | ICD-10-CM

## 2020-08-24 DIAGNOSIS — N183 Chronic kidney disease, stage 3 unspecified: Secondary | ICD-10-CM

## 2020-08-24 MED FILL — TRUEPLUS PEN NDL 32GX5/32: 32G X 4 MM | 50 days supply | Qty: 100 | Fill #1

## 2020-08-25 NOTE — Progress Notes (Signed)
Subjective: Robert Sparks presents today for follow up of at risk foot care. Pt has h/o NIDDM with chronic kidney disease, callus(es) submet head 1 b/l. Aggravating factors include weightbearing with and without shoe gear. Pain is relieved with periodic professional debridement. and painful porokeratotic lesion(s) plantar right hallux and painful mycotic toenails b/l that limit ambulation. Aggravating factors include weightbearing with and without shoe gear. Pain for both is relieved with periodic professional debridement.   Robert Sparks states his blood glucose was 160 mg/dl. He states callus right hallux is bothering him today.   Allergies  Allergen Reactions  . Other Other (See Comments)    Nicoderm CQ = Bad Dreams Nicotine gum = Bad Dreams     Objective: There were no vitals filed for this visit.  Pt is a pleasant 75 y.o. year old male  in NAD. AAO x 3.   Vascular Examination:  Capillary refill time to digits immediate b/l. Palpable DP pulses b/l. Palpable PT pulses b/l. Pedal hair absent b/l Skin temperature gradient within normal limits b/l. No edema noted b/l. No pain with calf compression b/l.  Dermatological Examination: Pedal skin with normal turgor, texture and tone bilaterally. No open wounds bilaterally.+interdigital macerations bilaterally. Toenails 1-5 b/l elongated, dystrophic, thickened, crumbly with subungual debris and tenderness to dorsal palpation. Hyperkeratotic lesion(s) submet head 1 left foot and submet head 1 right foot. No erythema, no edema, no drainage, no flocculence. Porokeratotic lesion(s) R hallux. No erythema, no edema, no drainage, no fluctuance.  Musculoskeletal: Normal muscle strength 5/5 to all lower extremity muscle groups bilaterally. No pain crepitus or joint limitation noted with ROM b/l. Hallux valgus with bunion deformity noted b/l.  Neurological: Protective sensation diminished with 10g monofilament b/l. Clonus negative b/l.  Assessment: 1.  Pain due to onychomycosis of toenails of both feet   2. Tinea pedis of both feet   3. Callus   4. Hallux valgus, acquired, bilateral   5. Diabetic peripheral neuropathy associated with type 2 diabetes mellitus ([place])    Plan: -Examined patient. -For interdigital tinea pedis, Rx written for Miconazole Nitrate Powder Spray 2% to be applied between toes once daily.  -Continue diabetic foot care principles. -Toenails 1-5 b/l were debrided in length and girth with sterile nail nippers and dremel without iatrogenic bleeding.  -Callus(es) submet head 1 left foot and submet head 1 right foot pared utilizing sterile scalpel blade without complication or incident. Total number debrided =2. -Painful porokeratotic lesion(s) R hallux pared and enucleated with sterile scalpel blade without incident. Continue use of digital toe cap daily for protection of digit. -Patient to report any pedal injuries to medical professional immediately. -Patient/POA to call should there be any questions/concerns in the interim.  Return in about 9 weeks (around 10/22/2020) for diabetic nail and callus trim.  Marzetta Board, DPM

## 2020-08-26 DIAGNOSIS — H547 Unspecified visual loss: Secondary | ICD-10-CM | POA: Diagnosis not present

## 2020-08-26 DIAGNOSIS — Z794 Long term (current) use of insulin: Secondary | ICD-10-CM | POA: Diagnosis not present

## 2020-08-26 DIAGNOSIS — G8929 Other chronic pain: Secondary | ICD-10-CM | POA: Diagnosis not present

## 2020-08-26 DIAGNOSIS — E1136 Type 2 diabetes mellitus with diabetic cataract: Secondary | ICD-10-CM | POA: Diagnosis not present

## 2020-08-26 DIAGNOSIS — R69 Illness, unspecified: Secondary | ICD-10-CM | POA: Diagnosis not present

## 2020-08-26 DIAGNOSIS — E1143 Type 2 diabetes mellitus with diabetic autonomic (poly)neuropathy: Secondary | ICD-10-CM | POA: Diagnosis not present

## 2020-08-26 DIAGNOSIS — E1142 Type 2 diabetes mellitus with diabetic polyneuropathy: Secondary | ICD-10-CM | POA: Diagnosis not present

## 2020-08-26 DIAGNOSIS — E785 Hyperlipidemia, unspecified: Secondary | ICD-10-CM | POA: Diagnosis not present

## 2020-08-26 DIAGNOSIS — C9000 Multiple myeloma not having achieved remission: Secondary | ICD-10-CM | POA: Diagnosis not present

## 2020-08-31 ENCOUNTER — Other Ambulatory Visit: Payer: Self-pay

## 2020-08-31 MED FILL — ONE TOUCH VERIO TEST STRIP: 33 days supply | Qty: 100 | Fill #0

## 2020-09-06 ENCOUNTER — Encounter: Payer: Self-pay | Admitting: Internal Medicine

## 2020-09-06 ENCOUNTER — Ambulatory Visit: Payer: Medicare HMO | Attending: Internal Medicine | Admitting: Internal Medicine

## 2020-09-06 ENCOUNTER — Other Ambulatory Visit: Payer: Self-pay

## 2020-09-06 DIAGNOSIS — I10 Essential (primary) hypertension: Secondary | ICD-10-CM | POA: Diagnosis not present

## 2020-09-06 DIAGNOSIS — C9001 Multiple myeloma in remission: Secondary | ICD-10-CM

## 2020-09-06 DIAGNOSIS — E1142 Type 2 diabetes mellitus with diabetic polyneuropathy: Secondary | ICD-10-CM

## 2020-09-06 DIAGNOSIS — R413 Other amnesia: Secondary | ICD-10-CM | POA: Diagnosis not present

## 2020-09-06 DIAGNOSIS — R69 Illness, unspecified: Secondary | ICD-10-CM | POA: Diagnosis not present

## 2020-09-06 DIAGNOSIS — F17200 Nicotine dependence, unspecified, uncomplicated: Secondary | ICD-10-CM

## 2020-09-06 DIAGNOSIS — Z794 Long term (current) use of insulin: Secondary | ICD-10-CM

## 2020-09-06 MED FILL — AMLODIPINE BESYLATE 10 MG T: 10 | 30 days supply | Qty: 30 | Fill #0

## 2020-09-06 NOTE — Progress Notes (Signed)
Pt states he sweats a lot at night pt sates it may be from some medicine he doesn't know

## 2020-09-06 NOTE — Progress Notes (Signed)
Virtual Visit via Telephone Note  I connected with Robert Sparks on 09/06/20 at 12:03 p.m by telephone and verified that I am speaking with the correct person using two identifiers.  Location: Patient: home Provider: office Pt, my CMA mS. Sallyanne Havers and myself participated in this encounter. I discussed the limitations, risks, security and privacy concerns of performing an evaluation and management service by telephone and the availability of in person appointments. I also discussed with the patient that there may be a patient responsible charge related to this service. The patient expressed understanding and agreed to proceed.   History of Present Illness: Patient with history of HTN,DM2with polyneuropathy,HL,CKD stage3, tob dependence, EtOH abuse, prostate CA,OA knee,vit B 12def, spinal stenosis,and multiple myelomain remission, memory changes MMSE 24/30 2021).Last seen 04/2020.  Reports he break out in sweats at nights sometimes.  This has been going on for a long time.  Usually sleeps with his temperature around 70.  Admits that he sleeps under a lot of covers and blankets.  Memory Decline:  Still has memory issues. Sometimes he forgets what he got up to do in another room.  I restarted Aricept last visit.  He is not sure whether it has made a difference or not.  He is not had any safety issues like getting lost or forgetting to leave the stove or water on.  DM:  Checks BS 2-3 x a day.  BS this a.m was 135.  Low BS occasionally; can tell when it is low Still on 6 units insulin BID "I eat too much and I stay hungry all the time." He tries to stay away from things that will elevate BS.  HTN: compliant with Norvasc and limits salt in foods.  BP this a.m was 145/85 which he states is high for him.  Yesterday BP was 123/78.   No CP/SOB/HA/dizziness  MM: saw Dr. Irene Limbo earlier this mth.  Still in remisson.  Tob dep:  Still smoking.  Feels he needs to quit smoking but not ready  to  Last Medicare Wellness - 05/25/20  Outpatient Encounter Medications as of 09/06/2020  Medication Sig  . acyclovir (ZOVIRAX) 400 MG tablet TAKE 1 TABLET (400 MG TOTAL) BY MOUTH 2 (TWO) TIMES DAILY.  [name] amLODipine (NORVASC) 10 MG tablet Take 1 tablet (10 mg total) by mouth daily.  [name] aspirin EC 81 MG tablet Take 1 tablet (81 mg total) by mouth daily.  [name] b complex vitamins capsule Take 1 capsule by mouth daily.  . Blood Glucose Monitoring Suppl (ONETOUCH VERIO) w/Device KIT Use to test blood sugar TID. E11.2  . buPROPion (WELLBUTRIN SR) 150 MG 12 hr tablet   . diclofenac Sodium (VOLTAREN) 1 % GEL Apply 4 g topically 4 (four) times daily.  [name] donepezil (ARICEPT) 5 MG tablet Take 1 tablet (5 mg total) by mouth at bedtime. For memory issues  . DULoxetine (CYMBALTA) 30 MG capsule Take 1 capsule (30 mg total) by mouth daily. X 1 week then 60 mg daily- for nerve pain/back pain  . ferrous sulfate 325 (65 FE) MG tablet Take 325 mg by mouth daily.  [name] gabapentin (NEURONTIN) 300 MG capsule TAKE 1 CAPSULE BY MOUTH IN THE MORNING AND LUNCH, AND 2 AT BEDTIME  . glucose blood (ONETOUCH VERIO) test strip USE TO TEST BLOOD SUGAR THREE TIMES DAILY  . hydrOXYzine (ATARAX/VISTARIL) 25 MG tablet TAKE 1 TABLET (25 MG TOTAL) BY MOUTH 3 (THREE) TIMES DAILY AS NEEDED FOR ITCHING (RASH).  [name] ixazomib citrate (NINLARO) 3  MG capsule Take 1 capsule (3 mg) by mouth weekly, 3 weeks on, 1 week off, repeat every 4 weeks. Take on an empty stomach 1hr before or 2hr after meals.  . lidocaine (LIDODERM) 5 %   . metaxalone (SKELAXIN) 800 MG tablet TAKE 1 TABLET (800 MG TOTAL) BY MOUTH AT BEDTIME AS NEEDED FOR MUSCLE SPASMS.  . Miconazole Nitrate (ATHLETES FOOT POWDER SPRAY) 2 % AERP Spray between toes and under toes once daily.  . Multiple Vitamin (MULTIVITAMIN WITH MINERALS) TABS tablet Take 1 tablet by mouth daily.  [name] NOVOLOG MIX 70/30 FLEXPEN (70-30) 100 UNIT/ML FlexPen INJECT 6 UNITS INTO THE SKIN 2 (TWO) TIMES DAILY WITH A MEAL.   Glory Rosebush Delica Lancets 38T MISC Use to test blood sugar TID. E11.2  . pravastatin (PRAVACHOL) 20 MG tablet Take 1 tablet (20 mg total) by mouth daily.  . tadalafil (CIALIS) 20 MG tablet Take 20 mg by mouth as needed.  . TRUEPLUS 5-BEVEL PEN NEEDLES 32G X 4 MM MISC USE AS DIRECTED TWICE DAILY WITH INSULIN  . vitamin B-12 (CYANOCOBALAMIN) 100 MCG tablet Take 100 mcg by mouth daily.  . Vitamins/Minerals TABS Take by mouth.   No facility-administered encounter medications on file as of 09/06/2020.    Observations/Objective: Lab Results  Component Value Date   WBC 5.8 08/19/2020   HGB 11.6 (L) 08/19/2020   HCT 33.6 (L) 08/19/2020   MCV 98.8 08/19/2020   PLT 190 08/19/2020     Chemistry      Component Value Date/Time   NA 136 08/19/2020 0855   NA 141 05/04/2020 1658   NA 137 07/13/2017 0938   K 3.8 08/19/2020 0855   K 3.3 (L) 07/13/2017 0938   CL 105 08/19/2020 0855   CO2 23 08/19/2020 0855   CO2 25 07/13/2017 0938   BUN 25 (H) 08/19/2020 0855   BUN 20 05/04/2020 1658   BUN 23.8 07/13/2017 0938   CREATININE 1.63 (H) 08/19/2020 0855   CREATININE 1.7 (H) 07/13/2017 0938      Component Value Date/Time   CALCIUM 9.2 08/19/2020 0855   CALCIUM 9.3 07/13/2017 0938   ALKPHOS 91 08/19/2020 0855   ALKPHOS 119 07/13/2017 0938   AST 23 08/19/2020 0855   AST 14 07/13/2017 0938   ALT 26 08/19/2020 0855   ALT 13 07/13/2017 0938   BILITOT 0.6 08/19/2020 0855   BILITOT 0.45 07/13/2017 0938     Lab Results  Component Value Date   HGBA1C 7.1 (A) 05/04/2020     Assessment and Plan: 1. Type 2 diabetes mellitus with peripheral neuropathy (HCC) Continue current dose of insulin.  Dietary counseling given.  He will come to the lab to have A1c done. - Hemoglobin A1c; Future  2. Essential hypertension Patient reports that home blood pressure readings have been good except this morning.  He will continue current dose of amlodipine.  3. Tobacco dependence Advised to quit.  Discussed  health risks associated with smoking.  Patient not ready to commit to quitting.  Less than 5 minutes spent on counseling.  4. Memory changes He is taking and tolerating low-dose of Aricept.  We will have him continue with that for now.  5. Multiple myeloma in remission ([place]) Followed by oncology.   Follow Up Instructions: 4 mths   I discussed the assessment and treatment plan with the patient. The patient was provided an opportunity to ask questions and all were answered. The patient agreed with the plan and demonstrated an understanding of the  instructions.   The patient was advised to call back or seek an in-person evaluation if the symptoms worsen or if the condition fails to improve as anticipated.  I provided 12 minutes of non-face-to-face time during this encounter.   Karle Plumber, MD

## 2020-09-13 MED FILL — NOVOLOG MIX 70-30 FLEXPEN S: (70-30) 100 | 25 days supply | Qty: 3 | Fill #2

## 2020-09-14 MED FILL — GABAPENTIN 300 MG CAPSULE: 300 | 30 days supply | Qty: 120 | Fill #6

## 2020-09-27 MED FILL — ONE TOUCH VERIO TEST STRIP: 33 days supply | Qty: 100 | Fill #1

## 2020-10-11 ENCOUNTER — Other Ambulatory Visit: Payer: Self-pay

## 2020-10-11 MED FILL — Insulin Aspart Prot & Aspart Sus Pen-inj 100 Unit/ML (70-30): SUBCUTANEOUS | 25 days supply | Qty: 3 | Fill #0 | Status: AC

## 2020-10-12 ENCOUNTER — Other Ambulatory Visit: Payer: Self-pay

## 2020-10-12 MED FILL — Amlodipine Besylate Tab 10 MG (Base Equivalent): ORAL | 90 days supply | Qty: 90 | Fill #0 | Status: AC

## 2020-10-13 ENCOUNTER — Other Ambulatory Visit: Payer: Self-pay

## 2020-10-15 ENCOUNTER — Other Ambulatory Visit: Payer: Self-pay

## 2020-10-15 ENCOUNTER — Encounter: Payer: Self-pay | Admitting: Podiatry

## 2020-10-15 ENCOUNTER — Ambulatory Visit (INDEPENDENT_AMBULATORY_CARE_PROVIDER_SITE_OTHER): Payer: Medicare HMO | Admitting: Podiatry

## 2020-10-15 DIAGNOSIS — L84 Corns and callosities: Secondary | ICD-10-CM

## 2020-10-15 DIAGNOSIS — Q828 Other specified congenital malformations of skin: Secondary | ICD-10-CM

## 2020-10-15 DIAGNOSIS — E1142 Type 2 diabetes mellitus with diabetic polyneuropathy: Secondary | ICD-10-CM | POA: Diagnosis not present

## 2020-10-15 DIAGNOSIS — B351 Tinea unguium: Secondary | ICD-10-CM | POA: Diagnosis not present

## 2020-10-15 DIAGNOSIS — M79674 Pain in right toe(s): Secondary | ICD-10-CM

## 2020-10-15 DIAGNOSIS — M79675 Pain in left toe(s): Secondary | ICD-10-CM

## 2020-10-15 NOTE — Progress Notes (Signed)
Subjective: Robert Sparks Code presents today for follow up of at risk foot care. Pt has h/o NIDDM with chronic kidney disease, callus(es) submet head 1 b/l. Aggravating factors include weightbearing with and without shoe gear. Pain is relieved with periodic professional debridement. and painful porokeratotic lesion(s) plantar right hallux and painful mycotic toenails b/l that limit ambulation. Aggravating factors include weightbearing with and without shoe gear. Pain for both is relieved with periodic professional debridement.   Patient states his feet stay sore. He takes gabapentin prescribed by PCP, but has not related symptoms to PCP.   Robert Sparks states his blood glucose was 111 mg/dl. He states he did not get antifungal spray powder, but he did purchase antifungal powder to place between his toes. He voices no new pedal concerns on today's visit.  Allergies  Allergen Reactions  . Other Other (See Comments)    Nicoderm CQ = Bad Dreams Nicotine gum = Bad Dreams     Objective: There were no vitals filed for this visit.  Pt is a pleasant 75 y.o. year old male  in NAD. AAO x 3.   Vascular Examination:  Capillary refill time to digits immediate b/l. Palpable DP pulses b/l. Palpable PT pulses b/l. Pedal hair absent b/l Skin temperature gradient within normal limits b/l. No edema noted b/l. No pain with calf compression b/l.  Dermatological Examination: Pedal skin with normal turgor, texture and tone bilaterally. No open wounds bilaterally.+interdigital macerations bilaterally. Toenails 1-5 b/l elongated, dystrophic, thickened, crumbly with subungual debris and tenderness to dorsal palpation. Hyperkeratotic lesion(s) submet head 1 left foot and submet head 1 right foot. No erythema, no edema, no drainage, no flocculence. Porokeratotic lesion(s) R hallux. No erythema, no edema, no drainage, no fluctuance.  Musculoskeletal: Normal muscle strength 5/5 to all lower extremity muscle groups  bilaterally. No pain crepitus or joint limitation noted with ROM b/l. Hallux valgus with bunion deformity noted b/l.  Neurological: Protective sensation diminished with 10g monofilament b/l. Clonus negative b/l.  Assessment: 1. Pain due to onychomycosis of toenails of both feet   2. Callus   3. Porokeratosis   4. Diabetic peripheral neuropathy associated with type 2 diabetes mellitus ([name])    Plan: -Examined patient. -I have asked him to inform PCP of increased symptoms. He related understanding.   -Continue diabetic foot care principles. -Toenails 1-5 b/l were debrided in length and girth with sterile nail nippers and dremel without iatrogenic bleeding.  -Callus(es) submet head 1 left foot pared utilizing sterile scalpel blade without complication or incident. Total number debrided =1. -Painful porokeratotic lesion(s) R hallux pared and enucleated with sterile scalpel blade without incident. Continue use of digital toe cap daily for protection of digit. -Patient to report any pedal injuries to medical professional immediately. -Patient/POA to call should there be any questions/concerns in the interim.  Return in about 9 weeks (around 12/17/2020).  Marzetta Board, DPM

## 2020-10-18 ENCOUNTER — Other Ambulatory Visit: Payer: Self-pay

## 2020-10-18 ENCOUNTER — Other Ambulatory Visit: Payer: Self-pay | Admitting: Internal Medicine

## 2020-10-18 MED FILL — Glucose Blood Test Strip: 33 days supply | Qty: 100 | Fill #0 | Status: CN

## 2020-10-19 ENCOUNTER — Other Ambulatory Visit: Payer: Self-pay

## 2020-10-19 MED ORDER — GABAPENTIN 300 MG PO CAPS
ORAL_CAPSULE | ORAL | 6 refills | Status: DC
  Filled 2020-10-19: qty 120, 30d supply, fill #0
  Filled 2020-11-26: qty 120, 30d supply, fill #1
  Filled 2021-01-05: qty 120, 30d supply, fill #2
  Filled 2021-02-08: qty 120, 30d supply, fill #3
  Filled 2021-03-21: qty 120, 30d supply, fill #4
  Filled 2021-04-26: qty 120, 30d supply, fill #5
  Filled 2021-06-06: qty 120, 30d supply, fill #6

## 2020-10-20 ENCOUNTER — Other Ambulatory Visit: Payer: Self-pay

## 2020-10-25 ENCOUNTER — Other Ambulatory Visit: Payer: Self-pay

## 2020-10-25 ENCOUNTER — Ambulatory Visit (INDEPENDENT_AMBULATORY_CARE_PROVIDER_SITE_OTHER): Payer: Medicare HMO | Admitting: Podiatry

## 2020-10-25 DIAGNOSIS — M2012 Hallux valgus (acquired), left foot: Secondary | ICD-10-CM

## 2020-10-25 DIAGNOSIS — M2011 Hallux valgus (acquired), right foot: Secondary | ICD-10-CM

## 2020-10-25 DIAGNOSIS — E1142 Type 2 diabetes mellitus with diabetic polyneuropathy: Secondary | ICD-10-CM

## 2020-10-25 DIAGNOSIS — Q828 Other specified congenital malformations of skin: Secondary | ICD-10-CM

## 2020-10-25 MED FILL — Glucose Blood Test Strip: 25 days supply | Qty: 100 | Fill #0 | Status: CN

## 2020-10-25 NOTE — Progress Notes (Signed)
Patient presented for foam casting for 3 pair custom diabetic shoe inserts. Patient is measured with a [employer] Device to be a size 11 wide.  Diabetic shoes are chosen from the [employer] Catalog. The shoes chosen are 672.  The patient will be contacted when the shoes and inserts are ready to be picked up.

## 2020-11-01 ENCOUNTER — Other Ambulatory Visit: Payer: Self-pay

## 2020-11-01 MED FILL — Glucose Blood Test Strip: 33 days supply | Qty: 100 | Fill #0 | Status: AC

## 2020-11-02 ENCOUNTER — Other Ambulatory Visit: Payer: Self-pay

## 2020-11-02 MED FILL — Acyclovir Tab 400 MG: ORAL | 30 days supply | Qty: 60 | Fill #0 | Status: AC

## 2020-11-02 MED FILL — Insulin Aspart Prot & Aspart Sus Pen-inj 100 Unit/ML (70-30): SUBCUTANEOUS | 25 days supply | Qty: 3 | Fill #1 | Status: AC

## 2020-11-03 ENCOUNTER — Other Ambulatory Visit: Payer: Self-pay

## 2020-11-03 MED FILL — Pravastatin Sodium Tab 20 MG: ORAL | 90 days supply | Qty: 90 | Fill #0 | Status: AC

## 2020-11-04 ENCOUNTER — Other Ambulatory Visit: Payer: Self-pay

## 2020-11-15 ENCOUNTER — Other Ambulatory Visit: Payer: Self-pay

## 2020-11-15 MED FILL — Insulin Pen Needle 32 G X 4 MM (1/6" or 5/32"): 50 days supply | Qty: 100 | Fill #0 | Status: AC

## 2020-11-15 NOTE — Progress Notes (Signed)
HEMATOLOGY/ONCOLOGY CLINIC NOTE  Date of Service: 11/16/2020  Patient Care Team: [name], MD as PCP - General (Internal Medicine) [name], MD (Inactive) as Consulting Physician (Hematology and Oncology)  CHIEF COMPLAINTS/PURPOSE OF CONSULTATION:  F/u for continued mx of Multiple Myeloma  Oncologic History:  75 y.o. male with diagnosis of IgA lambda active multiple myeloma, ISS Stage I. Active disease diagnosed based on presence of anemia, kidney insufficiency, and paraproteinemia with significant predominance of lambda light chains as well as significant elevation of IgA. Bone marrow biopsy confirmed presence of atypical monoclonal plasma cell process in the bone marrow comprising at least 7% of the cellularity. Based on the findings, patient was started on treatment with lenalidomide, bortezomib, and low-dose dexamethasone based on the anticipated tolerance by the patient. Lenalidomide was started at 10 mg daily based on creatinine clearance of 42, dexamethasone dose was reduced to 20 mg weekly based on patient's age. Treatment was complicated by rapid development of cutaneous rash attributed to lenalidomide, inaddition to decreased renal function and now patient has persistent severe anemia. Side-effects were attributed to Lenalidomide and lenalidomide was discontinued with subsequent recovery.  Patient has been receiving bortezomib weekly with low-dose dexamethasone in 4-day cycles.  Patient has completed induction systemic therapy for his disease with repeat bone marrow biopsy confirming complete response including no evidence of minimal residual disease by cytogenetics or FISH.  HISTORY OF PRESENTING ILLNESS:   Robert Sparks is a wonderful 75 y.o. male who has been referred to [place] by my colleague Dr Grace Isaac for evaluation and management of Multiple Myeloma. The pt reports that he is doing well overall.   The pt has been previously followed by my colleague  Dr Grace Isaac. The pt was treated with Revlimid, Velcade, and dexamethasone and was subsequently switched to Velcade and Dexamethasone after a Revlimid intolerance.   The pt reports some knee aches and skin soreness in his legs which is worse at night. He notes that he has had diabetic-related peripheral neuropathy. The pt has not seen [facility] yet for a discussion of a BM transplant. He continues taking Acyclovir and notes no difficulty taking maintenance Velcade.   He adds that he takes Gabapentin and sweats.  He notes that his neuropathy is currently stable and denies it worsening.   He notes that a pack of cigarettes lasts him for about 3 days.   Most recent lab results (12/14/17) of CBC and CMP  is as follows: all values are WNL except for RBC at 3.08, HGB at 10.1, HCT at 31.0, MCV at 100.6, Eosinophils Abs at 1.4k, Potassium at 3.4, Glucose at 156, Creatinine at 1.60. LDH 12/14/17 is 141 Beta 2 microglobulin 12/14/17 is 2.4  On review of systems, pt reports knee pain, night time LE skin soreness, peripheral neuropathy, eating well, strong appetite, intermittent sweating and denies fevers, chills, back pains, abdominal pains, and any other symptoms.   [name] DM type 2, HTN, CKD. On Social Hx the pt reports smoking a pack of cigarettes every 3 days.   INTERVAL HISTORY:   [name] returns today regarding management and evaluation of his Multiple Myeloma. [name] was on 08/19/2020. The pt reports that he is doing well overall.  The pt reports that his blood sugars have been very well controlled. He has not had any new symptoms or concerns. The pt notes he still experiences night sweats every night that wets his  clothes. The pt notes he has been eating and drinking well. He has been staying active by walking a lot and spending time outdoors. The pt notes his neuropathy has been fairly stable. He feels as though his hands and feet have a dry  feeling, but are not burning or tingling often. He notes a soreness.  Lab results today 11/16/2020 of CBC w/diff and CMP is as follows: all values are WNL except for RBC of 3.92, MCV of 100.8, MCH of 34.7, Eosinophils Abs of 0.8K, Glucose of 192, Creatinine of 1.51, GFR est of 49. 11/16/2020 Light Chains normal ratio 11/16/2020 MMP in progress.  On review of systems, pt reports continued night sweats, intermittent constipation and denies worsening neuropathy, abdominal pain, leg swelling, diarrhea, and any other symptoms.  MEDICAL HISTORY:  Past Medical History:  Diagnosis Date  . Anemia   . Arthritis    shoulders,feet   . Cataract    per eye dr -has appt 5-11  . CKD (chronic kidney disease), stage III ([place])   . Elevated PSA   . History of ketoacidosis    03-29-2015   . History of sepsis    07-25-2013  non-compliant w/ medication  . Hyperlipidemia   . Hypertension   . Nocturia   . Peripheral neuropathy   . Prostate cancer ([place])   . Type 2 diabetes mellitus with insulin therapy ([place])     SURGICAL HISTORY: Past Surgical History:  Procedure Laterality Date  . CIRCUMCISION  1990's  . PROSTATE BIOPSY N/A 12/21/2015   Procedure: BIOPSY TRANSRECTAL ULTRASONIC PROSTATE (TUBP);  Surgeon: Festus Aloe, MD;  Location: [facility];  Service: Urology;  Laterality: N/A;    SOCIAL HISTORY: Social History   Socioeconomic History  . Marital status: Single    Spouse name: Not on file  . Number of children: Not on file  . Years of education: Not on file  . Highest education level: Not on file  Occupational History  . Not on file  Tobacco Use  . Smoking status: Current Every Day Smoker    Packs/day: 0.50    Years: 50.00    Pack years: 25.00    Types: Cigarettes  . Smokeless tobacco: Never Used  . Tobacco comment: 1 ppwk-4-5 a day   Vaping Use  . Vaping Use: Never used  Substance and Sexual Activity  . Alcohol use: Yes    Alcohol/week: 1.0 standard drink     Types: 1 Cans of beer per week    Comment: 1 every day-quit couple weeks ago  . Drug use: No  . Sexual activity: Not Currently  Other Topics Concern  . Not on file  Social History Narrative  . Not on file   Social Determinants of Health   Financial Resource Strain: Not on file  Food Insecurity: Not on file  Transportation Needs: Not on file  Physical Activity: Not on file  Stress: Not on file  Social Connections: Not on file  Intimate Partner Violence: Not on file    FAMILY HISTORY: Family History  Problem Relation Age of Onset  . Kidney disease Mother   . Cancer Neg Hx   . Colon cancer Neg Hx   . Colon polyps Neg Hx   . Esophageal cancer Neg Hx   . Rectal cancer Neg Hx   . Stomach cancer Neg Hx     ALLERGIES:  is allergic to other.  MEDICATIONS:  Current Outpatient Medications  Medication Sig Dispense Refill  . acyclovir (ZOVIRAX)  400 MG tablet TAKE 1 TABLET (400 MG TOTAL) BY MOUTH 2 (TWO) TIMES DAILY. 60 tablet 5  . amLODipine (NORVASC) 10 MG tablet TAKE 1 TABLET (10 MG TOTAL) BY MOUTH DAILY. 30 tablet 6  . aspirin EC 81 MG tablet Take 1 tablet (81 mg total) by mouth daily. 90 tablet 3  . b complex vitamins capsule Take 1 capsule by mouth daily. 30 capsule 5  . Blood Glucose Monitoring Suppl (ONETOUCH VERIO) w/Device KIT Use to test blood sugar TID. E11.2 1 kit 0  . buPROPion (WELLBUTRIN SR) 150 MG 12 hr tablet     . diclofenac Sodium (VOLTAREN) 1 % GEL Apply 4 g topically 4 (four) times daily. 150 g 5  . donepezil (ARICEPT) 5 MG tablet TAKE 1 TABLET (5 MG TOTAL) BY MOUTH AT BEDTIME. FOR MEMORY ISSUES 30 tablet 2  . DULoxetine (CYMBALTA) 30 MG capsule Take 1 capsule (30 mg total) by mouth daily. X 1 week then 60 mg daily- for nerve pain/back pain 60 capsule 5  . ferrous sulfate 325 (65 FE) MG tablet Take 325 mg by mouth daily.    [name] gabapentin (NEURONTIN) 300 MG capsule TAKE 1 CAPSULE BY MOUTH IN THE MORNING AND LUNCH, AND 2 AT BEDTIME 120 capsule 6  . glucose blood  test strip USE TO TEST BLOOD SUGAR THREE TIMES DAILY 100 strip 2  . hydrOXYzine (ATARAX/VISTARIL) 25 MG tablet TAKE 1 TABLET (25 MG TOTAL) BY MOUTH 3 (THREE) TIMES DAILY AS NEEDED FOR ITCHING (RASH). 30 tablet 0  . ibuprofen (ADVIL) 800 MG tablet TAKE 1 TABLET BY MOUTH 3 TIMES DAILY 15 tablet 0  . insulin aspart protamine - aspart (NOVOLOG 70/30 MIX) (70-30) 100 UNIT/ML FlexPen INJECT 6 UNITS INTO THE SKIN 2 (TWO) TIMES DAILY WITH A MEAL. 3 mL 6  . Insulin Pen Needle 32G X 4 MM MISC USE AS DIRECTED TWICE DAILY WITH INSULIN 100 each 6  . ixazomib citrate (NINLARO) 3 MG capsule Take 1 capsule (3 mg) by mouth weekly, 3 weeks on, 1 week off, repeat every 4 weeks. Take on an empty stomach 1hr before or 2hr after meals. 3 capsule 3  . lidocaine (LIDODERM) 5 %     . lidocaine (LIDODERM) 5 % PLACE 1 PATCH ONTO SKIN. REMOVE AFTER 12 HOURS EXTERNAL ONCE A DAY 30 DAY(S) 30 patch 2  . metaxalone (SKELAXIN) 800 MG tablet TAKE 1 TABLET (800 MG TOTAL) BY MOUTH AT BEDTIME AS NEEDED FOR MUSCLE SPASMS. 30 tablet 3  . Miconazole Nitrate (ATHLETES FOOT POWDER SPRAY) 2 % AERP Spray between toes and under toes once daily. 130 g 4  . Multiple Vitamin (MULTIVITAMIN WITH MINERALS) TABS tablet Take 1 tablet by mouth daily. 60 tablet 2  . OneTouch Delica Lancets 78G MISC Use to test blood sugar TID. E11.2 100 each 2  . pravastatin (PRAVACHOL) 20 MG tablet TAKE 1 TABLET (20 MG TOTAL) BY MOUTH DAILY. 90 tablet 1  . tadalafil (CIALIS) 20 MG tablet Take 20 mg by mouth as needed.    . vitamin B-12 (CYANOCOBALAMIN) 100 MCG tablet Take 100 mcg by mouth daily.    . Vitamins/Minerals TABS Take by mouth.     No current facility-administered medications for this visit.    REVIEW OF SYSTEMS:   10 Point review of Systems was done is negative except as noted above.  PHYSICAL EXAMINATION: ECOG PERFORMANCE STATUS: 1 - Symptomatic but completely ambulatory Exam was given in a chair  .BP (!) 147/75 (BP Location: Left  Arm, Patient  Position: Sitting)   Pulse 77   Temp (!) 97.1 F (36.2 C) (Tympanic)   Resp 18   Ht '5\' 7"'  (1.702 m)   Wt 164 lb 4.8 oz (74.5 kg)   SpO2 100%   BMI 25.73 kg/m    GENERAL:alert, in no acute distress and comfortable SKIN: no acute rashes, no significant lesions EYES: conjunctiva are pink and non-injected, sclera anicteric OROPHARYNX: MMM, no exudates, no oropharyngeal erythema or ulceration NECK: supple, no JVD LYMPH:  no palpable lymphadenopathy in the cervical, axillary or inguinal regions LUNGS: clear to auscultation b/l with normal respiratory effort HEART: regular rate & rhythm ABDOMEN:  normoactive bowel sounds , non tender, not distended. Extremity: no pedal edema PSYCH: alert & oriented x 3 with fluent speech NEURO: no focal motor/sensory deficits  LABORATORY DATA:  I have reviewed the data as listed  . CBC Latest Ref Rng & Units 11/16/2020 08/19/2020 05/19/2020  WBC 4.0 - 10.5 K/uL 5.9 5.8 6.2  Hemoglobin 13.0 - 17.0 g/dL 13.6 11.6(L) 11.1(L)  Hematocrit 39.0 - 52.0 % 39.5 33.6(L) 32.7(L)  Platelets 150 - 400 K/uL 200 190 196    . CMP Latest Ref Rng & Units 11/16/2020 08/19/2020 05/19/2020  Glucose 70 - 99 mg/dL 192(H) 181(H) 161(H)  BUN 8 - 23 mg/dL 17 25(H) 22  Creatinine 0.61 - 1.24 mg/dL 1.51(H) 1.63(H) 1.54(H)  Sodium 135 - 145 mmol/L 139 136 137  Potassium 3.5 - 5.1 mmol/L 3.9 3.8 3.9  Chloride 98 - 111 mmol/L 103 105 103  CO2 22 - 32 mmol/L '25 23 26  ' Calcium 8.9 - 10.3 mg/dL 9.7 9.2 9.7  Total Protein 6.5 - 8.1 g/dL 7.7 7.2 7.3  Total Bilirubin 0.3 - 1.2 mg/dL 0.9 0.6 0.7  Alkaline Phos 38 - 126 U/L 71 91 73  AST 15 - 41 U/L '30 23 30  ' ALT 0 - 44 U/L 32 26 26   08/29/17 BM Bx:    08/29/17 Cytogenetics:   03/13/17 Cytogenetics:          RADIOGRAPHIC STUDIES: I have personally reviewed the radiological images as listed and agreed with the findings in the report. No results found.  ASSESSMENT & PLAN:   75 y.o. male with   1. IgA Lambda  Multiple Myeloma ISS Stage I - currently in remission  Active disease was previously diagnosed based on presence of anemia, kidney insufficiency, and paraproteinemia with significant predominance of lambda light chains as well as significant elevation of IgA.   PLAN:  -Discussed pt labwork today, 11/16/2020; blood counts stable, kidney function stable. Hgb has been improving. -Advised pt that his last MMP showed no m-protein but the immunofixation showed some abnormal protein. Will continue to monitor for need to start back on maintenance treatment. -Advised pt to make sure his blood sugars are not dropping at night and causing the night sweats. These could also be due to medications the pt is on such as Cymbalta, Neurontin, and Aricept. -Recommended pt receive the second COVID booster shot as recently approved. The pt notes that he has already received this, -Discussed [place] and pt's eligibility. The pt wishes to hold on the referral at this time. -No lab or clinical indication of Multiple Myeloma progression/recurrence at this time. Will continue watchful observation.  -Will continue to hold maintenance Ninlaro due to significant side effects (rash/neuropathy)- will have low threshold to restart if signs of progression. -Continue B-complex and Vitamin D daily. -Will see back in 3 months with labs.  FOLLOW UP: RTC with Dr Irene Limbo with labs in 3 months    The total time spent in the appointment was 20 minutes and more than 50% was on counseling and direct patient cares.  All of the patient's questions were answered with apparent satisfaction. The patient knows to call the clinic with any problems, questions or concerns.   Sullivan Lone MD [place] AAHIVMS [facility] Hematology/Oncology Physician [facility]  (Office):       [phone] (Work cell):  [phone] (Fax):           [phone]  11/16/2020 10:04 AM  I, Reinaldo Raddle, am acting as scribe for Dr. Sullivan Lone, MD.    .I have reviewed the above documentation for accuracy and completeness, and I agree with the above. Brunetta Genera MD

## 2020-11-16 ENCOUNTER — Inpatient Hospital Stay: Payer: Medicare HMO

## 2020-11-16 ENCOUNTER — Inpatient Hospital Stay: Payer: Medicare HMO | Attending: Hematology | Admitting: Hematology

## 2020-11-16 ENCOUNTER — Other Ambulatory Visit: Payer: Self-pay

## 2020-11-16 VITALS — BP 147/75 | HR 77 | Temp 97.1°F | Resp 18 | Ht 67.0 in | Wt 164.3 lb

## 2020-11-16 DIAGNOSIS — C9001 Multiple myeloma in remission: Secondary | ICD-10-CM | POA: Diagnosis not present

## 2020-11-16 LAB — CMP (CANCER CENTER ONLY)
ALT: 32 U/L (ref 0–44)
AST: 30 U/L (ref 15–41)
Albumin: 4.3 g/dL (ref 3.5–5.0)
Alkaline Phosphatase: 71 U/L (ref 38–126)
Anion gap: 11 (ref 5–15)
BUN: 17 mg/dL (ref 8–23)
CO2: 25 mmol/L (ref 22–32)
Calcium: 9.7 mg/dL (ref 8.9–10.3)
Chloride: 103 mmol/L (ref 98–111)
Creatinine: 1.51 mg/dL — ABNORMAL HIGH (ref 0.61–1.24)
GFR, Estimated: 49 mL/min — ABNORMAL LOW (ref >60)
Glucose, Bld: 192 mg/dL — ABNORMAL HIGH (ref 70–99)
Potassium: 3.9 mmol/L (ref 3.5–5.1)
Sodium: 139 mmol/L (ref 135–145)
Total Bilirubin: 0.9 mg/dL (ref 0.3–1.2)
Total Protein: 7.7 g/dL (ref 6.5–8.1)

## 2020-11-16 LAB — CBC WITH DIFFERENTIAL/PLATELET
Abs Immature Granulocytes: 0.02 10*3/uL (ref 0.00–0.07)
Basophils Absolute: 0.1 10*3/uL (ref 0.0–0.1)
Basophils Relative: 1 %
Eosinophils Absolute: 0.8 10*3/uL — ABNORMAL HIGH (ref 0.0–0.5)
Eosinophils Relative: 14 %
HCT: 39.5 % (ref 39.0–52.0)
Hemoglobin: 13.6 g/dL (ref 13.0–17.0)
Immature Granulocytes: 0 %
Lymphocytes Relative: 23 %
Lymphs Abs: 1.4 10*3/uL (ref 0.7–4.0)
MCH: 34.7 pg — ABNORMAL HIGH (ref 26.0–34.0)
MCHC: 34.4 g/dL (ref 30.0–36.0)
MCV: 100.8 fL — ABNORMAL HIGH (ref 80.0–100.0)
Monocytes Absolute: 0.4 10*3/uL (ref 0.1–1.0)
Monocytes Relative: 7 %
Neutro Abs: 3.2 10*3/uL (ref 1.7–7.7)
Neutrophils Relative %: 55 %
Platelets: 200 10*3/uL (ref 150–400)
RBC: 3.92 MIL/uL — ABNORMAL LOW (ref 4.22–5.81)
RDW: 13 % (ref 11.5–15.5)
WBC: 5.9 10*3/uL (ref 4.0–10.5)
nRBC: 0 % (ref 0.0–0.2)

## 2020-11-17 ENCOUNTER — Telehealth: Payer: Self-pay | Admitting: Hematology

## 2020-11-17 LAB — KAPPA/LAMBDA LIGHT CHAINS
Kappa free light chain: 52.1 mg/L — ABNORMAL HIGH (ref 3.3–19.4)
Kappa, lambda light chain ratio: 0.76 (ref 0.26–1.65)
Lambda free light chains: 68.7 mg/L — ABNORMAL HIGH (ref 5.7–26.3)

## 2020-11-17 NOTE — Telephone Encounter (Signed)
Left message with follow-up appointment per 5/10 los. Gave option to call back to reschedule if needed. 

## 2020-11-22 LAB — MULTIPLE MYELOMA PANEL, SERUM
Albumin SerPl Elph-Mcnc: 4 g/dL (ref 2.9–4.4)
Albumin/Glob SerPl: 1.5 (ref 0.7–1.7)
Alpha 1: 0.2 g/dL (ref 0.0–0.4)
Alpha2 Glob SerPl Elph-Mcnc: 0.8 g/dL (ref 0.4–1.0)
B-Globulin SerPl Elph-Mcnc: 1 g/dL (ref 0.7–1.3)
Gamma Glob SerPl Elph-Mcnc: 0.9 g/dL (ref 0.4–1.8)
Globulin, Total: 2.8 g/dL (ref 2.2–3.9)
IgA: 345 mg/dL (ref 61–437)
IgG (Immunoglobin G), Serum: 929 mg/dL (ref 603–1613)
IgM (Immunoglobulin M), Srm: 104 mg/dL (ref 15–143)
Total Protein ELP: 6.8 g/dL (ref 6.0–8.5)

## 2020-11-25 ENCOUNTER — Other Ambulatory Visit: Payer: Self-pay | Admitting: Internal Medicine

## 2020-11-25 ENCOUNTER — Other Ambulatory Visit: Payer: Self-pay

## 2020-11-25 DIAGNOSIS — Z794 Long term (current) use of insulin: Secondary | ICD-10-CM

## 2020-11-25 DIAGNOSIS — N183 Chronic kidney disease, stage 3 unspecified: Secondary | ICD-10-CM

## 2020-11-25 MED ORDER — ONETOUCH VERIO VI STRP
ORAL_STRIP | 2 refills | Status: DC
  Filled 2020-11-25: qty 100, 33d supply, fill #0
  Filled 2020-12-23: qty 100, 33d supply, fill #1
  Filled 2021-01-17: qty 100, 33d supply, fill #2

## 2020-11-25 MED FILL — Insulin Aspart Prot & Aspart Sus Pen-inj 100 Unit/ML (70-30): SUBCUTANEOUS | 25 days supply | Qty: 3 | Fill #2 | Status: AC

## 2020-11-25 NOTE — Telephone Encounter (Signed)
Requested Prescriptions  Pending Prescriptions Disp Refills  . glucose blood (ONETOUCH VERIO) test strip 100 strip 2    Sig: USE TO TEST BLOOD SUGAR THREE TIMES DAILY     Endocrinology: Diabetes - Testing Supplies Passed - 11/25/2020  3:35 PM      Passed - Valid encounter within last 12 months    Recent Outpatient Visits          2 months ago Type 2 diabetes mellitus with peripheral neuropathy ([place])   [place] [name], MD   6 months ago Medicare annual wellness visit, initial   [place], [place] J, NP   6 months ago Type 2 diabetes mellitus with peripheral neuropathy [facility])   [place] Karle Plumber B, MD   12 months ago Controlled type 2 diabetes mellitus with stage 3 chronic kidney disease, with long-term current use of insulin ([place])   [place] [name], MD   1 year ago Appointment canceled by hospital   [place], MD      Future Appointments            In 1 month Wynetta Emery Dalbert Batman, MD [place]

## 2020-11-26 ENCOUNTER — Other Ambulatory Visit: Payer: Self-pay

## 2020-11-29 ENCOUNTER — Other Ambulatory Visit: Payer: Self-pay

## 2020-12-16 ENCOUNTER — Other Ambulatory Visit: Payer: Self-pay

## 2020-12-17 ENCOUNTER — Encounter: Payer: Self-pay | Admitting: Podiatry

## 2020-12-17 ENCOUNTER — Ambulatory Visit (INDEPENDENT_AMBULATORY_CARE_PROVIDER_SITE_OTHER): Payer: Medicare HMO | Admitting: Podiatry

## 2020-12-17 ENCOUNTER — Other Ambulatory Visit: Payer: Self-pay

## 2020-12-17 DIAGNOSIS — M79674 Pain in right toe(s): Secondary | ICD-10-CM

## 2020-12-17 DIAGNOSIS — Q828 Other specified congenital malformations of skin: Secondary | ICD-10-CM | POA: Diagnosis not present

## 2020-12-17 DIAGNOSIS — B351 Tinea unguium: Secondary | ICD-10-CM

## 2020-12-17 DIAGNOSIS — L84 Corns and callosities: Secondary | ICD-10-CM | POA: Diagnosis not present

## 2020-12-17 DIAGNOSIS — E1342 Other specified diabetes mellitus with diabetic polyneuropathy: Secondary | ICD-10-CM | POA: Diagnosis not present

## 2020-12-17 DIAGNOSIS — M79675 Pain in left toe(s): Secondary | ICD-10-CM | POA: Diagnosis not present

## 2020-12-17 DIAGNOSIS — E1142 Type 2 diabetes mellitus with diabetic polyneuropathy: Secondary | ICD-10-CM

## 2020-12-21 ENCOUNTER — Other Ambulatory Visit: Payer: Self-pay

## 2020-12-21 MED FILL — Insulin Aspart Prot & Aspart Sus Pen-inj 100 Unit/ML (70-30): SUBCUTANEOUS | 25 days supply | Qty: 3 | Fill #3 | Status: AC

## 2020-12-22 NOTE — Progress Notes (Signed)
  Subjective:  Patient ID: Robert Sparks, male    DOB: January 21, 1946,  MRN: [id number]  75 y.o. male presents with at risk foot care with history of diabetic neuropathy and painful porokeratotic lesion(s) right hallux and painful mycotic toenails that limit ambulation. Painful toenails interfere with ambulation. Aggravating factors include wearing enclosed shoe gear. Pain is relieved with periodic professional debridement. Painful porokeratotic lesions are aggravated when weightbearing with and without shoegear. Pain is relieved with periodic professional debridement..    Patient's blood sugar was 116 mg/dl today.  PCP: [name], MD and last visit was: 09/06/2020.  Review of Systems: Negative except as noted in the HPI.   Allergies  Allergen Reactions   Other Other (See Comments)    Nicoderm CQ = Bad Dreams Nicotine gum = Bad Dreams     Objective:  There were no vitals filed for this visit. Constitutional Patient is a pleasant 75 y.o. African American male WD, WN in NAD. AAO x 3.  Vascular Capillary refill time to digits immediate b/l. Palpable pedal pulses b/l LE. Pedal hair absent. Lower extremity skin temperature gradient within normal limits. No pain with calf compression b/l. No edema noted b/l lower extremities. No cyanosis or clubbing noted.  Neurologic Normal speech. Protective sensation diminished with 10g monofilament b/l.  Dermatologic Pedal skin with normal turgor, texture and tone bilaterally. No open wounds bilaterally. No interdigital macerations bilaterally. Toenails 1-5 b/l elongated, discolored, dystrophic, thickened, crumbly with subungual debris and tenderness to dorsal palpation. Hyperkeratotic lesion(s) 1st metatarsal head right foot.  No erythema, no edema, no drainage, no fluctuance. Porokeratotic lesion(s) R hallux. No erythema, no edema, no drainage, no fluctuance.  Orthopedic: Normal muscle strength 5/5 to all lower extremity muscle groups bilaterally. No  pain crepitus or joint limitation noted with ROM b/l. Hallux valgus with bunion deformity noted b/l lower extremities.   Hemoglobin A1C Latest Ref Rng & Units 05/04/2020  HGBA1C 0.0 - 7.0 % 7.1(A)  Some recent data might be hidden   Assessment:   1. Pain due to onychomycosis of toenails of both feet   2. Callus   3. Porokeratosis   4. Diabetic peripheral neuropathy associated with type 2 diabetes mellitus (Robert Sparks)    Plan:  Patient was evaluated and treated and all questions answered.  Onychomycosis with pain -Nails palliatively debridement as below. -Educated on self-care  Procedure: Nail Debridement Rationale: Pain Type of Debridement: manual, sharp debridement. Instrumentation: Nail nipper, rotary burr. Number of Nails: 10  -Examined patient. -Patient to continue soft, supportive shoe gear daily. -Toenails 1-5 b/l were debrided in length and girth with sterile nail nippers and dremel without iatrogenic bleeding.  -Callus(es) 1st metatarsal head right foot pared utilizing sterile scalpel blade without complication or incident. Total number debrided =1. -Painful porokeratotic lesion(s) R hallux pared and enucleated with sterile scalpel blade without incident. Total number of lesions debrided=1. -Patient to report any pedal injuries to medical professional immediately. -Patient/POA to call should there be question/concern in the interim.  Return in about 9 weeks (around 02/18/2021).  Marzetta Board, DPM

## 2020-12-23 ENCOUNTER — Other Ambulatory Visit: Payer: Self-pay

## 2020-12-24 ENCOUNTER — Other Ambulatory Visit: Payer: Self-pay

## 2021-01-04 ENCOUNTER — Ambulatory Visit: Payer: Medicare HMO | Attending: Internal Medicine | Admitting: Internal Medicine

## 2021-01-04 ENCOUNTER — Other Ambulatory Visit: Payer: Self-pay

## 2021-01-04 ENCOUNTER — Encounter: Payer: Self-pay | Admitting: Internal Medicine

## 2021-01-04 VITALS — BP 131/68 | HR 62 | Resp 16 | Wt 165.6 lb

## 2021-01-04 DIAGNOSIS — E1142 Type 2 diabetes mellitus with diabetic polyneuropathy: Secondary | ICD-10-CM | POA: Diagnosis not present

## 2021-01-04 DIAGNOSIS — R413 Other amnesia: Secondary | ICD-10-CM

## 2021-01-04 DIAGNOSIS — E663 Overweight: Secondary | ICD-10-CM | POA: Diagnosis not present

## 2021-01-04 DIAGNOSIS — E785 Hyperlipidemia, unspecified: Secondary | ICD-10-CM | POA: Diagnosis not present

## 2021-01-04 DIAGNOSIS — N1831 Chronic kidney disease, stage 3a: Secondary | ICD-10-CM

## 2021-01-04 DIAGNOSIS — E1169 Type 2 diabetes mellitus with other specified complication: Secondary | ICD-10-CM

## 2021-01-04 DIAGNOSIS — I10 Essential (primary) hypertension: Secondary | ICD-10-CM | POA: Diagnosis not present

## 2021-01-04 LAB — POCT GLYCOSYLATED HEMOGLOBIN (HGB A1C): HbA1c, POC (controlled diabetic range): 7.2 % — AB (ref 0.0–7.0)

## 2021-01-04 LAB — GLUCOSE, POCT (MANUAL RESULT ENTRY): POC Glucose: 161 mg/dl — AB (ref 70–99)

## 2021-01-04 MED ORDER — DONEPEZIL HCL 5 MG PO TABS
ORAL_TABLET | ORAL | 2 refills | Status: DC
  Filled 2021-01-04: qty 30, 30d supply, fill #0
  Filled 2021-02-21: qty 30, 30d supply, fill #1

## 2021-01-04 MED ORDER — FREESTYLE LIBRE SENSOR SYSTEM MISC: 12 refills | Status: DC

## 2021-01-04 MED ORDER — FREESTYLE LIBRE READER DEVI: 1.0000 | Freq: Once | 0 refills | Status: AC

## 2021-01-04 NOTE — Progress Notes (Signed)
Patient ID: Robert Sparks, male    DOB: 1945-08-28  MRN: [id number]  CC: Diabetes and Hypertension   Subjective: Robert Sparks is a 75 y.o. male who presents for chronic ds management His concerns today include:  Patient with history of HTN, DM 2 with polyneuropathy, HL, CKD stage 3, tob dependence, EtOH abuse, prostate CA, OA knee, vit B 12 def, spinal stenosis, and multiple myeloma in remission, memory changes MMSE 24/30 2021).    Saw Dr. Irene Limbo last mth.  According to his note, the immunofixation showed some abnormal protein.  His plan is to continue to monitor for need to start back on maintenance treatment.  DM:  Results for orders placed or performed in visit on 01/04/21  POCT glucose (manual entry)  Result Value Ref Range   POC Glucose 161 (A) 70 - 99 mg/dl  POCT glycosylated hemoglobin (Hb A1C)  Result Value Ref Range   Hemoglobin A1C     HbA1c POC (<> result, manual entry)     HbA1c, POC (prediabetic range)     HbA1c, POC (controlled diabetic range) 7.2 (A) 0.0 - 7.0 %   Checks BS 3-4 x a day. Gives range 110-160.  Lowest has been in the 80s. Doing good with eating habits.  Walks 2 blocks daily. Wgh has remained stable.   Still on insulin 6 units BID Would like to get Libre meter Still gets sweats sometimes at nights.  Does not check BS when he feels the increase sweats.  His oncologist suggested that he may be having low blood sugar episodes or could be due to some of his medications such as Cymbalta, Neurontin and Aricept.  However patient not taking Aricept consistently enough for this to be the cause.  Last rxn written 04/2020 for 30 day supply.  He is also not taking Cymbalta.  He is taking the gabapentin.  Continues to report poor memory.  He is willing to give a trial of taking the Aricept consistently.  HTN/CKD: on Norvasc and taking consistently.  He tries to limit salt in the foods.  Denies any chest pains or shortness of breath. Stable kidney function with GFR  ranging 43-49.  Creatinine has been 1.46-1.63.  Most recent creatinine was 1.51.  No issues passing urine  HL: taking and tolerating Pravachol   Patient Active Problem List   Diagnosis Date Noted   Microalbuminuria 04/27/2019   Memory changes 04/25/2019   Hyperlipidemia associated with type 2 diabetes mellitus ([place]) 04/25/2019   Dementia associated with other underlying disease without behavioral disturbance ([place]) 03/14/2019   Chronic bilateral low back pain without sciatica 03/14/2019   Spinal stenosis, lumbar region with neurogenic claudication 03/14/2019   Myofascial muscle pain 03/14/2019   History of prostate cancer 12/20/2018   Neuropathy of right hand 12/20/2018   HCAP (healthcare-associated pneumonia) 05/02/2018   Cough    Primary osteoarthritis of right knee 01/07/2018   Diabetic polyneuropathy associated with type 2 diabetes mellitus ([place]) 01/07/2018   Vitamin B12 deficiency 09/04/2017   Pre-ulcerative corn or callous 04/12/2017   Cataract of both eyes 04/12/2017   Tobacco dependence 04/12/2017   Hypokalemia 03/23/2017   Multiple myeloma in remission ([place]) 02/23/2017   Oral leukoplakia 02/09/2017   Lightheadedness 11/10/2016   Hyperlipidemia 06/21/2016   CKD (chronic kidney disease) stage 3, GFR 30-59 ml/min ([place]) 02/23/2016   Malignant neoplasm of prostate ([place]) 02/21/2016   Controlled type 2 diabetes mellitus with stage 3 chronic kidney disease, with long-term current use of insulin (  [place]) 03/29/2015   HTN (hypertension) 07/28/2013   Anemia, chronic disease 07/26/2013   Sepsis ([place]) 07/25/2013   ETOH abuse 07/25/2013     Current Outpatient Medications on File Prior to Visit  Medication Sig Dispense Refill   amLODipine (NORVASC) 10 MG tablet TAKE 1 TABLET (10 MG TOTAL) BY MOUTH DAILY. 30 tablet 6   aspirin EC 81 MG tablet Take 1 tablet (81 mg total) by mouth daily. 90 tablet 3   b complex vitamins capsule Take 1 capsule by mouth daily. 30 capsule 5   Blood Glucose  Monitoring Suppl (ONETOUCH VERIO) w/Device KIT Use to test blood sugar TID. E11.2 1 kit 0   buPROPion (WELLBUTRIN SR) 150 MG 12 hr tablet      diclofenac Sodium (VOLTAREN) 1 % GEL Apply 4 g topically 4 (four) times daily. 150 g 5   ferrous sulfate 325 (65 FE) MG tablet Take 325 mg by mouth daily.     gabapentin (NEURONTIN) 300 MG capsule TAKE 1 CAPSULE BY MOUTH IN THE MORNING AND LUNCH, AND 2 AT BEDTIME 120 capsule 6   glucose blood (ONETOUCH VERIO) test strip USE TO TEST BLOOD SUGAR THREE TIMES DAILY 100 strip 2   hydrOXYzine (ATARAX/VISTARIL) 25 MG tablet TAKE 1 TABLET (25 MG TOTAL) BY MOUTH 3 (THREE) TIMES DAILY AS NEEDED FOR ITCHING (RASH). 30 tablet 0   ibuprofen (ADVIL) 800 MG tablet TAKE 1 TABLET BY MOUTH 3 TIMES DAILY 15 tablet 0   insulin aspart protamine - aspart (NOVOLOG 70/30 MIX) (70-30) 100 UNIT/ML FlexPen INJECT 6 UNITS INTO THE SKIN 2 (TWO) TIMES DAILY WITH A MEAL. 3 mL 6   Insulin Pen Needle 32G X 4 MM MISC USE AS DIRECTED TWICE DAILY WITH INSULIN 100 each 6   ixazomib citrate (NINLARO) 3 MG capsule Take 1 capsule (3 mg) by mouth weekly, 3 weeks on, 1 week off, repeat every 4 weeks. Take on an empty stomach 1hr before or 2hr after meals. 3 capsule 3   lidocaine (LIDODERM) 5 %      lidocaine (LIDODERM) 5 % PLACE 1 PATCH ONTO SKIN. REMOVE AFTER 12 HOURS EXTERNAL ONCE A DAY 30 DAY(S) 30 patch 2   metaxalone (SKELAXIN) 800 MG tablet TAKE 1 TABLET (800 MG TOTAL) BY MOUTH AT BEDTIME AS NEEDED FOR MUSCLE SPASMS. 30 tablet 3   Miconazole Nitrate (ATHLETES FOOT POWDER SPRAY) 2 % AERP Spray between toes and under toes once daily. 130 g 4   Multiple Vitamin (MULTIVITAMIN WITH MINERALS) TABS tablet Take 1 tablet by mouth daily. 60 tablet 2   OneTouch Delica Lancets 47S MISC Use to test blood sugar TID. E11.2 100 each 2   pravastatin (PRAVACHOL) 20 MG tablet TAKE 1 TABLET (20 MG TOTAL) BY MOUTH DAILY. 90 tablet 1   tadalafil (CIALIS) 20 MG tablet Take 20 mg by mouth as needed.     vitamin B-12  (CYANOCOBALAMIN) 100 MCG tablet Take 100 mcg by mouth daily.     Vitamins/Minerals TABS Take by mouth.     No current facility-administered medications on file prior to visit.    Allergies  Allergen Reactions   Other Other (See Comments)    Nicoderm CQ = Bad Dreams Nicotine gum = Bad Dreams     Social History   Socioeconomic History   Marital status: Single    Spouse name: Not on file   Number of children: Not on file   Years of education: Not on file   Highest education level: Not on  file  Occupational History   Not on file  Tobacco Use   Smoking status: Every Day    Packs/day: 0.50    Years: 50.00    Pack years: 25.00    Types: Cigarettes   Smokeless tobacco: Never   Tobacco comments:    1 ppwk-4-5 a day   Vaping Use   Vaping Use: Never used  Substance and Sexual Activity   Alcohol use: Yes    Alcohol/week: 1.0 standard drink    Types: 1 Cans of beer per week    Comment: 1 every day-quit couple weeks ago   Drug use: No   Sexual activity: Not Currently  Other Topics Concern   Not on file  Social History Narrative   Not on file   Social Determinants of Health   Financial Resource Strain: Not on file  Food Insecurity: Not on file  Transportation Needs: Not on file  Physical Activity: Not on file  Stress: Not on file  Social Connections: Not on file  Intimate Partner Violence: Not on file    Family History  Problem Relation Age of Onset   Kidney disease Mother    Cancer Neg Hx    Colon cancer Neg Hx    Colon polyps Neg Hx    Esophageal cancer Neg Hx    Rectal cancer Neg Hx    Stomach cancer Neg Hx     Past Surgical History:  Procedure Laterality Date   CIRCUMCISION  1990's   PROSTATE BIOPSY N/A 12/21/2015   Procedure: BIOPSY TRANSRECTAL ULTRASONIC PROSTATE (TUBP);  Surgeon: Festus Aloe, MD;  Location: [facility];  Service: Urology;  Laterality: N/A;    ROS: Review of Systems Negative except as stated above  PHYSICAL  EXAM: BP 131/68   Pulse 62   Resp 16   Wt 165 lb 9.6 oz (75.1 kg)   SpO2 97%   BMI 25.94 kg/m   Wt Readings from Last 3 Encounters:  01/04/21 165 lb 9.6 oz (75.1 kg)  11/16/20 164 lb 4.8 oz (74.5 kg)  08/19/20 165 lb 4.8 oz (75 kg)    Physical Exam   General appearance - alert, well appearing, pleasant elderly African-American male and in no distress Mental status - normal mood, behavior, speech, dress, motor activity, and thought processes Mouth - mucous membranes moist, pharynx normal without lesions Neck - supple, no significant adenopathy Chest - clear to auscultation, no wheezes, rales or rhonchi, symmetric air entry Heart - normal rate, regular rhythm, normal S1, S2, no murmurs, rubs, clicks or gallops Extremities - peripheral pulses normal, no pedal edema, no clubbing or cyanosis  CMP Latest Ref Rng & Units 11/16/2020 08/19/2020 05/19/2020  Glucose 70 - 99 mg/dL 192(H) 181(H) 161(H)  BUN 8 - 23 mg/dL 17 25(H) 22  Creatinine 0.61 - 1.24 mg/dL 1.51(H) 1.63(H) 1.54(H)  Sodium 135 - 145 mmol/L 139 136 137  Potassium 3.5 - 5.1 mmol/L 3.9 3.8 3.9  Chloride 98 - 111 mmol/L 103 105 103  CO2 22 - 32 mmol/L _0 Calcium 8.9 - 10.3 mg/dL 9.7 9.2 9.7  Total Protein 6.5 - 8.1 g/dL 7.7 7.2 7.3  Total Bilirubin 0.3 - 1.2 mg/dL 0.9 0.6 0.7  Alkaline Phos 38 - 126 U/L 71 91 73  AST 15 - 41 U/L _1 ALT 0 - 44 U/L 32 26 26   Lipid Panel     Component Value Date/Time   CHOL 184 04/25/2019 0939   TRIG  114 04/25/2019 0939   HDL 88 04/25/2019 0939   CHOLHDL 2.1 04/25/2019 0939   CHOLHDL 3.6 06/21/2016 1047   VLDL 50 (H) 06/21/2016 1047   LDLCALC 76 04/25/2019 0939    CBC    Component Value Date/Time   WBC 5.9 11/16/2020 0857   RBC 3.92 (L) 11/16/2020 0857   HGB 13.6 11/16/2020 0857   HGB 10.6 (L) 08/18/2019 1338   HGB 12.2 (L) 12/06/2017 0920   HGB 10.1 (L) 07/13/2017 0938   HCT 39.5 11/16/2020 0857   HCT 37.1 (L) 12/06/2017 0920   HCT 30.6 (L) 07/13/2017 0938    PLT 200 11/16/2020 0857   PLT 154 08/18/2019 1338   PLT 250 12/06/2017 0920   MCV 100.8 (H) 11/16/2020 0857   MCV 98 (H) 12/06/2017 0920   MCV 96.2 07/13/2017 0938   MCH 34.7 (H) 11/16/2020 0857   MCHC 34.4 11/16/2020 0857   RDW 13.0 11/16/2020 0857   RDW 14.1 12/06/2017 0920   RDW 14.4 07/13/2017 0938   LYMPHSABS 1.4 11/16/2020 0857   LYMPHSABS 0.8 (L) 07/13/2017 0938   MONOABS 0.4 11/16/2020 0857   MONOABS 0.4 07/13/2017 0938   EOSABS 0.8 (H) 11/16/2020 0857   EOSABS 0.5 07/13/2017 0938   EOSABS 0.8 (H) 11/21/2016 1416   BASOSABS 0.1 11/16/2020 0857   BASOSABS 0.0 07/13/2017 0938    ASSESSMENT AND PLAN: 1. Type 2 diabetes mellitus with peripheral neuropathy (HCC) A1c slightly above goal.  However given his age and chronic medical illnesses, I will make no change to his insulin dose at this time.  Encouraged him to continue healthy eating habits and try to move as much as he can. - POCT glucose (manual entry) - POCT glycosylated hemoglobin (Hb A1C) - Continuous Blood Gluc Receiver (FREESTYLE LIBRE READER) DEVI; 1 Device by Does not apply route once for 1 dose.  Dispense: 1 each; Refill: 0 - Continuous Blood Gluc Sensor (FREESTYLE LIBRE SENSOR SYSTEM) MISC; Change sensor Q 2 wks  Dispense: 2 each; Refill: 12  2. Essential hypertension Close to goal.  Continue amlodipine and low-salt diet.  3. Memory changes Encourage patient to try to take the Aricept daily to see if it would help with his memory.  He is willing to do so.  He will let me know if he experiences any side effects from the medication. - donepezil (ARICEPT) 5 MG tablet; TAKE 1 TABLET (5 MG TOTAL) BY MOUTH AT BEDTIME. FOR MEMORY ISSUES  Dispense: 30 tablet; Refill: 2  4. Stage 3a chronic kidney disease (HCC) Stable.  Discussed the importance of good blood pressure and diabetes control.  5. Hyperlipidemia associated with type 2 diabetes mellitus ([place]) Continue pravastatin.  6. Overweight (BMI 25.0-29.9) See #1  above.     Patient was given the opportunity to ask questions.  Patient verbalized understanding of the plan and was able to repeat key elements of the plan.   Orders Placed This Encounter  Procedures   POCT glucose (manual entry)   POCT glycosylated hemoglobin (Hb A1C)     Requested Prescriptions   Signed Prescriptions Disp Refills   Continuous Blood Gluc Receiver (FREESTYLE LIBRE READER) DEVI 1 each 0    Sig: 1 Device by Does not apply route once for 1 dose.   Continuous Blood Gluc Sensor (FREESTYLE LIBRE SENSOR SYSTEM) MISC 2 each 12    Sig: Change sensor Q 2 wks   donepezil (ARICEPT) 5 MG tablet 30 tablet 2    Sig: TAKE 1 TABLET (5  MG TOTAL) BY MOUTH AT BEDTIME. FOR MEMORY ISSUES    Return in about 4 months (around 05/06/2021).  Karle Plumber, MD, FACP

## 2021-01-05 ENCOUNTER — Other Ambulatory Visit: Payer: Self-pay

## 2021-01-12 ENCOUNTER — Other Ambulatory Visit: Payer: Self-pay | Admitting: Internal Medicine

## 2021-01-12 ENCOUNTER — Other Ambulatory Visit: Payer: Self-pay

## 2021-01-12 DIAGNOSIS — C9000 Multiple myeloma not having achieved remission: Secondary | ICD-10-CM

## 2021-01-12 DIAGNOSIS — N183 Chronic kidney disease, stage 3 unspecified: Secondary | ICD-10-CM

## 2021-01-12 MED ORDER — NOVOLOG MIX 70/30 FLEXPEN (70-30) 100 UNIT/ML ~~LOC~~ SUPN
PEN_INJECTOR | SUBCUTANEOUS | 6 refills | Status: DC
  Filled 2021-01-12: qty 3, 25d supply, fill #0
  Filled 2021-02-09: qty 3, 25d supply, fill #1
  Filled 2021-03-15: qty 3, 25d supply, fill #2
  Filled 2021-04-06 – 2021-04-07 (×2): qty 3, 25d supply, fill #3

## 2021-01-12 NOTE — Telephone Encounter (Signed)
Requested medication (s) are due for refill today: Yes  Requested medication (s) are on the active medication list: Yes  Last refill:  1 year ago  Future visit scheduled: No  Notes to clinic:  Unable to refill per protocol, last refill by another provider.      Requested Prescriptions  Pending Prescriptions Disp Refills   acyclovir (ZOVIRAX) 400 MG tablet 60 tablet 5    Sig: TAKE 1 TABLET (400 MG TOTAL) BY MOUTH 2 (TWO) TIMES DAILY.      Antimicrobials:  Antiviral Agents - Anti-Herpetic Passed - 01/12/2021 12:29 PM      Passed - Valid encounter within last 12 months    Recent Outpatient Visits           1 week ago Type 2 diabetes mellitus with peripheral neuropathy [facility])   [place] Karle Plumber B, MD   4 months ago Type 2 diabetes mellitus with peripheral neuropathy [facility])   [place] [name], MD   7 months ago Medicare annual wellness visit, initial   [place], [place] J, NP   8 months ago Type 2 diabetes mellitus with peripheral neuropathy [facility])   [place] [name], MD   1 year ago Controlled type 2 diabetes mellitus with stage 3 chronic kidney disease, with long-term current use of insulin [facility])   [place] [facility] [name], MD

## 2021-01-13 ENCOUNTER — Other Ambulatory Visit: Payer: Self-pay

## 2021-01-17 ENCOUNTER — Other Ambulatory Visit: Payer: Self-pay

## 2021-01-19 ENCOUNTER — Other Ambulatory Visit: Payer: Self-pay

## 2021-01-24 ENCOUNTER — Other Ambulatory Visit: Payer: Self-pay

## 2021-01-24 MED FILL — Amlodipine Besylate Tab 10 MG (Base Equivalent): ORAL | 90 days supply | Qty: 90 | Fill #1 | Status: AC

## 2021-01-25 ENCOUNTER — Other Ambulatory Visit: Payer: Self-pay

## 2021-01-27 DIAGNOSIS — H40013 Open angle with borderline findings, low risk, bilateral: Secondary | ICD-10-CM | POA: Diagnosis not present

## 2021-01-27 DIAGNOSIS — E119 Type 2 diabetes mellitus without complications: Secondary | ICD-10-CM | POA: Diagnosis not present

## 2021-01-27 DIAGNOSIS — H25813 Combined forms of age-related cataract, bilateral: Secondary | ICD-10-CM | POA: Diagnosis not present

## 2021-01-27 DIAGNOSIS — Z794 Long term (current) use of insulin: Secondary | ICD-10-CM | POA: Diagnosis not present

## 2021-01-31 ENCOUNTER — Other Ambulatory Visit: Payer: Self-pay

## 2021-01-31 ENCOUNTER — Other Ambulatory Visit: Payer: Self-pay | Admitting: Internal Medicine

## 2021-01-31 ENCOUNTER — Telehealth: Payer: Self-pay | Admitting: Internal Medicine

## 2021-01-31 DIAGNOSIS — C9000 Multiple myeloma not having achieved remission: Secondary | ICD-10-CM

## 2021-01-31 MED ORDER — ACYCLOVIR 400 MG PO TABS
ORAL_TABLET | ORAL | 5 refills | Status: DC
Start: 2021-01-31 — End: 2021-10-10
  Filled 2021-01-31: qty 60, 30d supply, fill #0
  Filled 2021-03-17: qty 60, 30d supply, fill #1
  Filled 2021-05-10: qty 60, 30d supply, fill #2
  Filled 2021-09-26: qty 60, 30d supply, fill #0

## 2021-01-31 NOTE — Telephone Encounter (Signed)
-----   Message from Brunetta Genera, MD sent at 01/31/2021  1:07 AM EDT ----- Yes.. we usually continue for 6-12 months after stopping proteosome inhibitors. Thx GK ----- Message ----- From: [name], MD Sent: 01/13/2021  10:03 PM EDT To: Brunetta Genera, MD  I received a request from this pt for RF on Acyclovir.  Looks like you had prescribed this for him when he was receiving active treatment for myeloma.  Does he still need to be on this or no?

## 2021-02-01 ENCOUNTER — Other Ambulatory Visit: Payer: Self-pay

## 2021-02-01 MED FILL — Insulin Pen Needle 32 G X 4 MM (1/6" or 5/32"): 50 days supply | Qty: 100 | Fill #1 | Status: AC

## 2021-02-02 ENCOUNTER — Other Ambulatory Visit: Payer: Self-pay

## 2021-02-08 ENCOUNTER — Other Ambulatory Visit: Payer: Self-pay | Admitting: Internal Medicine

## 2021-02-08 ENCOUNTER — Other Ambulatory Visit: Payer: Self-pay

## 2021-02-08 DIAGNOSIS — E1122 Type 2 diabetes mellitus with diabetic chronic kidney disease: Secondary | ICD-10-CM

## 2021-02-08 DIAGNOSIS — N183 Chronic kidney disease, stage 3 unspecified: Secondary | ICD-10-CM

## 2021-02-08 MED ORDER — ONETOUCH VERIO VI STRP
ORAL_STRIP | 2 refills | Status: DC
  Filled 2021-02-08: qty 100, fill #0
  Filled 2021-03-01: qty 100, 30d supply, fill #0
  Filled 2021-03-31: qty 100, 30d supply, fill #1
  Filled 2021-05-02: qty 100, 30d supply, fill #2

## 2021-02-09 ENCOUNTER — Other Ambulatory Visit: Payer: Self-pay

## 2021-02-10 ENCOUNTER — Other Ambulatory Visit: Payer: Self-pay

## 2021-02-15 ENCOUNTER — Other Ambulatory Visit: Payer: Self-pay | Admitting: *Deleted

## 2021-02-15 ENCOUNTER — Telehealth: Payer: Self-pay | Admitting: Hematology

## 2021-02-15 DIAGNOSIS — C9001 Multiple myeloma in remission: Secondary | ICD-10-CM

## 2021-02-15 NOTE — Telephone Encounter (Signed)
Rescheduled upcoming appointment due to provider's emergency. Patient is aware of changes. 

## 2021-02-16 ENCOUNTER — Inpatient Hospital Stay: Payer: Medicare Other | Admitting: Hematology

## 2021-02-16 ENCOUNTER — Inpatient Hospital Stay: Payer: Medicare Other

## 2021-02-18 ENCOUNTER — Ambulatory Visit (INDEPENDENT_AMBULATORY_CARE_PROVIDER_SITE_OTHER): Payer: Medicare Other | Admitting: Podiatry

## 2021-02-18 ENCOUNTER — Other Ambulatory Visit: Payer: Self-pay

## 2021-02-18 ENCOUNTER — Encounter: Payer: Self-pay | Admitting: Podiatry

## 2021-02-18 DIAGNOSIS — E1142 Type 2 diabetes mellitus with diabetic polyneuropathy: Secondary | ICD-10-CM

## 2021-02-18 DIAGNOSIS — M79675 Pain in left toe(s): Secondary | ICD-10-CM

## 2021-02-18 DIAGNOSIS — L84 Corns and callosities: Secondary | ICD-10-CM | POA: Diagnosis not present

## 2021-02-18 DIAGNOSIS — B351 Tinea unguium: Secondary | ICD-10-CM

## 2021-02-18 DIAGNOSIS — M79674 Pain in right toe(s): Secondary | ICD-10-CM

## 2021-02-18 DIAGNOSIS — Q828 Other specified congenital malformations of skin: Secondary | ICD-10-CM | POA: Diagnosis not present

## 2021-02-21 ENCOUNTER — Other Ambulatory Visit: Payer: Self-pay

## 2021-02-21 ENCOUNTER — Other Ambulatory Visit: Payer: Self-pay | Admitting: Internal Medicine

## 2021-02-21 ENCOUNTER — Other Ambulatory Visit: Payer: Self-pay | Admitting: Physical Medicine and Rehabilitation

## 2021-02-21 DIAGNOSIS — E1169 Type 2 diabetes mellitus with other specified complication: Secondary | ICD-10-CM

## 2021-02-21 MED ORDER — PRAVASTATIN SODIUM 20 MG PO TABS
ORAL_TABLET | Freq: Every day | ORAL | 0 refills | Status: DC
  Filled 2021-02-21: qty 90, 90d supply, fill #0

## 2021-02-21 NOTE — Progress Notes (Signed)
  Subjective:  Patient ID: Robert Sparks, male    DOB: [birth year],  MRN: [id number]  75 y.o. male presents with at risk foot care with history of diabetic neuropathy.  He has callus of left foot, painful porokeratotic lesion(s) right great toe and painful mycotic toenails that limit ambulation. Painful toenails interfere with ambulation. Aggravating factors include wearing enclosed shoe gear. Pain is relieved with periodic professional debridement. Painful porokeratotic lesions are aggravated when weightbearing with and without shoegear. Pain is relieved with periodic professional debridement..    Patient's blood sugar was 113 mg/dl on yesterday. Patient did not check blood glucose this morning.  PCP: [name], MD and last visit was: 01/04/2021  Review of Systems: Negative except as noted in the HPI.   Allergies  Allergen Reactions   Other Other (See Comments)    Nicoderm CQ = Bad Dreams Nicotine gum = Bad Dreams     Objective:  There were no vitals filed for this visit. Constitutional Patient is a pleasant 75 y.o. African American male WD, WN in NAD. AAO x 3.  Vascular Capillary refill time to digits immediate b/l. Palpable DP pulse(s) b/l lower extremities Palpable PT pulse(s) b/l lower extremities Pedal hair absent. Lower extremity skin temperature gradient within normal limits. No pain with calf compression b/l. No edema noted b/l lower extremities. No cyanosis or clubbing noted.  Neurologic Normal speech. Protective sensation diminished with 10g monofilament b/l.  Dermatologic Pedal skin with normal turgor, texture and tone b/l lower extremities. No open wounds b/l lower extremities. No interdigital macerations b/l lower extremities. Toenails 1-5 b/l elongated, discolored, dystrophic, thickened, crumbly with subungual debris and tenderness to dorsal palpation. Hyperkeratotic lesion(s) 1st metatarsal head left foot.  No erythema, no edema, no drainage, no fluctuance.  Porokeratotic lesion(s) R hallux. No erythema, no edema, no drainage, no fluctuance.  Orthopedic: Normal muscle strength 5/5 to all lower extremity muscle groups bilaterally. No pain crepitus or joint limitation noted with ROM b/l lower extremities. Hallux valgus with bunion deformity noted b/l lower extremities.   Hemoglobin A1C Latest Ref Rng & Units 01/04/2021 05/04/2020  HGBA1C 0.0 - 7.0 % 7.2(A) 7.1(A)  Some recent data might be hidden   Assessment:   1. Pain due to onychomycosis of toenails of both feet   2. Callus   3. Porokeratosis   4. Diabetic peripheral neuropathy associated with type 2 diabetes mellitus ([place])    Plan:  -Examined patient. -Continue diabetic foot care principles: inspect feet daily, monitor glucose as recommended by PCP and/or Endocrinologist, and follow prescribed diet per PCP, Endocrinologist and/or dietician. -Patient to continue soft, supportive shoe gear daily. -Toenails 1-5 b/l were debrided in length and girth with sterile nail nippers and dremel without iatrogenic bleeding.  -Callus(es) 1st metatarsal head left foot pared utilizing sterile scalpel blade without complication or incident. Total number debrided =1. -Painful porokeratotic lesion(s) R hallux pared and enucleated with sterile scalpel blade without incident. Total number of lesions debrided=1. -Patient to report any pedal injuries to medical professional immediately. -Patient/POA to call should there be question/concern in the interim.  Return in about 3 months (around 05/21/2021).  Marzetta Board, DPM

## 2021-02-22 ENCOUNTER — Other Ambulatory Visit: Payer: Self-pay | Admitting: Internal Medicine

## 2021-02-22 ENCOUNTER — Other Ambulatory Visit: Payer: Self-pay

## 2021-02-22 MED ORDER — METAXALONE 800 MG PO TABS
ORAL_TABLET | ORAL | 3 refills | Status: DC
  Filled 2021-02-22: qty 30, 30d supply, fill #0
  Filled 2021-03-30: qty 30, 30d supply, fill #1

## 2021-02-22 NOTE — Telephone Encounter (Signed)
  Notes to clinic:  Medication filled by a different provider  Review for continued use and refill    Requested Prescriptions  Pending Prescriptions Disp Refills   metaxalone (SKELAXIN) 800 MG tablet 30 tablet 3    Sig: TAKE 1 TABLET (800 MG TOTAL) BY MOUTH AT BEDTIME AS NEEDED FOR MUSCLE SPASMS.     There is no refill protocol information for this order

## 2021-02-23 ENCOUNTER — Other Ambulatory Visit: Payer: Self-pay

## 2021-03-01 ENCOUNTER — Other Ambulatory Visit: Payer: Self-pay

## 2021-03-04 ENCOUNTER — Inpatient Hospital Stay (HOSPITAL_BASED_OUTPATIENT_CLINIC_OR_DEPARTMENT_OTHER): Payer: Medicare Other | Admitting: Hematology

## 2021-03-04 ENCOUNTER — Other Ambulatory Visit: Payer: Self-pay

## 2021-03-04 ENCOUNTER — Inpatient Hospital Stay: Payer: Medicare Other | Attending: Hematology

## 2021-03-04 VITALS — BP 129/67 | HR 74 | Temp 98.4°F | Resp 18 | Wt 164.2 lb

## 2021-03-04 DIAGNOSIS — C9001 Multiple myeloma in remission: Secondary | ICD-10-CM | POA: Insufficient documentation

## 2021-03-04 LAB — CMP (CANCER CENTER ONLY)
ALT: 26 U/L (ref 0–44)
AST: 22 U/L (ref 15–41)
Albumin: 3.6 g/dL (ref 3.5–5.0)
Alkaline Phosphatase: 101 U/L (ref 38–126)
Anion gap: 9 (ref 5–15)
BUN: 17 mg/dL (ref 8–23)
CO2: 23 mmol/L (ref 22–32)
Calcium: 9.4 mg/dL (ref 8.9–10.3)
Chloride: 106 mmol/L (ref 98–111)
Creatinine: 1.5 mg/dL — ABNORMAL HIGH (ref 0.61–1.24)
GFR, Estimated: 49 mL/min — ABNORMAL LOW (ref >60)
Glucose, Bld: 201 mg/dL — ABNORMAL HIGH (ref 70–99)
Potassium: 3.8 mmol/L (ref 3.5–5.1)
Sodium: 138 mmol/L (ref 135–145)
Total Bilirubin: 0.8 mg/dL (ref 0.3–1.2)
Total Protein: 7 g/dL (ref 6.5–8.1)

## 2021-03-04 LAB — CBC WITH DIFFERENTIAL (CANCER CENTER ONLY)
Abs Immature Granulocytes: 0.02 10*3/uL (ref 0.00–0.07)
Basophils Absolute: 0 10*3/uL (ref 0.0–0.1)
Basophils Relative: 1 %
Eosinophils Absolute: 0.7 10*3/uL — ABNORMAL HIGH (ref 0.0–0.5)
Eosinophils Relative: 11 %
HCT: 35.4 % — ABNORMAL LOW (ref 39.0–52.0)
Hemoglobin: 12.6 g/dL — ABNORMAL LOW (ref 13.0–17.0)
Immature Granulocytes: 0 %
Lymphocytes Relative: 19 %
Lymphs Abs: 1.2 10*3/uL (ref 0.7–4.0)
MCH: 35.7 pg — ABNORMAL HIGH (ref 26.0–34.0)
MCHC: 35.6 g/dL (ref 30.0–36.0)
MCV: 100.3 fL — ABNORMAL HIGH (ref 80.0–100.0)
Monocytes Absolute: 0.4 10*3/uL (ref 0.1–1.0)
Monocytes Relative: 6 %
Neutro Abs: 4.1 10*3/uL (ref 1.7–7.7)
Neutrophils Relative %: 63 %
Platelet Count: 197 10*3/uL (ref 150–400)
RBC: 3.53 MIL/uL — ABNORMAL LOW (ref 4.22–5.81)
RDW: 13.1 % (ref 11.5–15.5)
WBC Count: 6.4 10*3/uL (ref 4.0–10.5)
nRBC: 0 % (ref 0.0–0.2)

## 2021-03-07 ENCOUNTER — Telehealth: Payer: Self-pay | Admitting: Hematology

## 2021-03-07 ENCOUNTER — Other Ambulatory Visit: Payer: Self-pay

## 2021-03-07 ENCOUNTER — Other Ambulatory Visit: Payer: Self-pay | Admitting: Hematology

## 2021-03-07 LAB — KAPPA/LAMBDA LIGHT CHAINS
Kappa free light chain: 52.5 mg/L — ABNORMAL HIGH (ref 3.3–19.4)
Kappa, lambda light chain ratio: 0.35 (ref 0.26–1.65)
Lambda free light chains: 147.9 mg/L — ABNORMAL HIGH (ref 5.7–26.3)

## 2021-03-07 MED ORDER — HYDROXYZINE HCL 25 MG PO TABS
25.0000 mg | ORAL_TABLET | Freq: Three times a day (TID) | ORAL | 0 refills | Status: DC | PRN
  Filled 2021-03-07: qty 30, 10d supply, fill #0

## 2021-03-07 NOTE — Telephone Encounter (Signed)
Scheduled follow-up appointments per 8/26 los. Patient is aware. 

## 2021-03-08 LAB — MULTIPLE MYELOMA PANEL, SERUM
Albumin SerPl Elph-Mcnc: 3.8 g/dL (ref 2.9–4.4)
Albumin/Glob SerPl: 1.5 (ref 0.7–1.7)
Alpha 1: 0.1 g/dL (ref 0.0–0.4)
Alpha2 Glob SerPl Elph-Mcnc: 0.7 g/dL (ref 0.4–1.0)
B-Globulin SerPl Elph-Mcnc: 1 g/dL (ref 0.7–1.3)
Gamma Glob SerPl Elph-Mcnc: 0.8 g/dL (ref 0.4–1.8)
Globulin, Total: 2.6 g/dL (ref 2.2–3.9)
IgA: 484 mg/dL — ABNORMAL HIGH (ref 61–437)
IgG (Immunoglobin G), Serum: 815 mg/dL (ref 603–1613)
IgM (Immunoglobulin M), Srm: 80 mg/dL (ref 15–143)
M Protein SerPl Elph-Mcnc: 0.5 g/dL — ABNORMAL HIGH
Total Protein ELP: 6.4 g/dL (ref 6.0–8.5)

## 2021-03-10 NOTE — Progress Notes (Signed)
HEMATOLOGY/ONCOLOGY CLINIC NOTE  Date of Service: 03/10/2021  Patient Care Team: [name], MD as PCP - General (Internal Medicine) [name], MD (Inactive) as Consulting Physician (Hematology and Oncology)  CHIEF COMPLAINTS/PURPOSE OF CONSULTATION:  F/u for continued mx of Multiple Myeloma  Oncologic History:  75 y.o. male with diagnosis of IgA lambda active multiple myeloma, ISS Stage I. Active disease diagnosed based on presence of anemia, kidney insufficiency, and paraproteinemia with significant predominance of lambda light chains as well as significant elevation of IgA. Bone marrow biopsy confirmed presence of atypical monoclonal plasma cell process in the bone marrow comprising at least 7% of the cellularity. Based on the findings, patient was started on treatment with lenalidomide, bortezomib, and low-dose dexamethasone based on the anticipated tolerance by the patient. Lenalidomide was started at 10 mg daily based on creatinine clearance of 42, dexamethasone dose was reduced to 20 mg weekly based on patient's age. Treatment was complicated by rapid development of cutaneous rash attributed to lenalidomide, inaddition to decreased renal function and now patient has persistent severe anemia. Side-effects were attributed to Lenalidomide and lenalidomide was discontinued with subsequent recovery.  Patient has been receiving bortezomib weekly with low-dose dexamethasone in 4-day cycles.   Patient has completed induction systemic therapy for his disease with repeat bone marrow biopsy confirming complete response including no evidence of minimal residual disease by cytogenetics or FISH.  HISTORY OF PRESENTING ILLNESS:   Robert Sparks is a wonderful 75 y.o. male who has been referred to [place] by my colleague Dr Grace Isaac for evaluation and management of Multiple Myeloma. The pt reports that he is doing well overall.   The pt has been previously followed by my colleague  Dr Grace Isaac. The pt was treated with Revlimid, Velcade, and dexamethasone and was subsequently switched to Velcade and Dexamethasone after a Revlimid intolerance.   The pt reports some knee aches and skin soreness in his legs which is worse at night. He notes that he has had diabetic-related peripheral neuropathy. The pt has not seen [facility] yet for a discussion of a BM transplant. He continues taking Acyclovir and notes no difficulty taking maintenance Velcade.   He adds that he takes Gabapentin and sweats.  He notes that his neuropathy is currently stable and denies it worsening.   He notes that a pack of cigarettes lasts him for about 3 days.   Most recent lab results (12/14/17) of CBC and CMP  is as follows: all values are WNL except for RBC at 3.08, HGB at 10.1, HCT at 31.0, MCV at 100.6, Eosinophils Abs at 1.4k, Potassium at 3.4, Glucose at 156, Creatinine at 1.60. LDH 12/14/17 is 141 Beta 2 microglobulin 12/14/17 is 2.4  On review of systems, pt reports knee pain, night time LE skin soreness, peripheral neuropathy, eating well, strong appetite, intermittent sweating and denies fevers, chills, back pains, abdominal pains, and any other symptoms.   [name] DM type 2, HTN, CKD. On Social Hx the pt reports smoking a pack of cigarettes every 3 days.   [name] returns today regarding management and evaluation of his Multiple Myeloma. [name] was on 11/16/2020. The pt reports that he is doing well overall.  Patient notes no acute new symptoms.  Neuropathy is stable.  No new bone pains.  No fevers no chills no night sweats no new fatigue.  Eating well.  No other acute  new symptoms.  Lab results today 03/04/2021  CBC is unremarkable with no anemia and normal WBC count and platelets CMP shows stable chronic kidney disease with creatinine of 1.5 no hypercalcemia Light Chains normal ratio MMP in progress.  On review of  systems, pt reports continued night sweats, intermittent constipation and denies worsening neuropathy, abdominal pain, leg swelling, diarrhea, and any other symptoms.  MEDICAL HISTORY:  Past Medical History:  Diagnosis Date   Anemia    Arthritis    shoulders,feet    Cataract    per eye dr -has appt 5-11   CKD (chronic kidney disease), stage III (HCC)    Elevated PSA    History of ketoacidosis    03-29-2015    History of sepsis    07-25-2013  non-compliant w/ medication   Hyperlipidemia    Hypertension    Nocturia    Peripheral neuropathy    Prostate cancer ([place])    Type 2 diabetes mellitus with insulin therapy [facility])     SURGICAL HISTORY: Past Surgical History:  Procedure Laterality Date   CIRCUMCISION  1990's   PROSTATE BIOPSY N/A 12/21/2015   Procedure: BIOPSY TRANSRECTAL ULTRASONIC PROSTATE (TUBP);  Surgeon: Festus Aloe, MD;  Location: [facility];  Service: Urology;  Laterality: N/A;    SOCIAL HISTORY: Social History   Socioeconomic History   Marital status: Single    Spouse name: Not on file   Number of children: Not on file   Years of education: Not on file   Highest education level: Not on file  Occupational History   Not on file  Tobacco Use   Smoking status: Every Day    Packs/day: 0.50    Years: 50.00    Pack years: 25.00    Types: Cigarettes   Smokeless tobacco: Never   Tobacco comments:    1 ppwk-4-5 a day   Vaping Use   Vaping Use: Never used  Substance and Sexual Activity   Alcohol use: Yes    Alcohol/week: 1.0 standard drink    Types: 1 Cans of beer per week    Comment: 1 every day-quit couple weeks ago   Drug use: No   Sexual activity: Not Currently  Other Topics Concern   Not on file  Social History Narrative   Not on file   Social Determinants of Health   Financial Resource Strain: Not on file  Food Insecurity: Not on file  Transportation Needs: Not on file  Physical Activity: Not on file  Stress: Not on file   Social Connections: Not on file  Intimate Partner Violence: Not on file    FAMILY HISTORY: Family History  Problem Relation Age of Onset   Kidney disease Mother    Cancer Neg Hx    Colon cancer Neg Hx    Colon polyps Neg Hx    Esophageal cancer Neg Hx    Rectal cancer Neg Hx    Stomach cancer Neg Hx     ALLERGIES:  is allergic to other.  MEDICATIONS:  Current Outpatient Medications  Medication Sig Dispense Refill   pravastatin (PRAVACHOL) 20 MG tablet TAKE 1 TABLET (20 MG TOTAL) BY MOUTH DAILY. 90 tablet 0   acyclovir (ZOVIRAX) 400 MG tablet TAKE 1 TABLET (400 MG TOTAL) BY MOUTH 2 (TWO) TIMES DAILY. 60 tablet 5   amLODipine (NORVASC) 10 MG tablet TAKE 1 TABLET (10 MG TOTAL) BY MOUTH DAILY. 30 tablet 6   aspirin EC 81 MG tablet Take 1 tablet (81 mg  total) by mouth daily. 90 tablet 3   b complex vitamins capsule Take 1 capsule by mouth daily. 30 capsule 5   Blood Glucose Monitoring Suppl (ONETOUCH VERIO) w/Device KIT Use to test blood sugar TID. E11.2 1 kit 0   buPROPion (WELLBUTRIN SR) 150 MG 12 hr tablet      Continuous Blood Gluc Sensor (FREESTYLE LIBRE SENSOR SYSTEM) MISC Change sensor Q 2 wks 2 each 12   diclofenac Sodium (VOLTAREN) 1 % GEL Apply 4 g topically 4 (four) times daily. 150 g 5   donepezil (ARICEPT) 5 MG tablet TAKE 1 TABLET (5 MG TOTAL) BY MOUTH AT BEDTIME. FOR MEMORY ISSUES 30 tablet 2   ferrous sulfate 325 (65 FE) MG tablet Take 325 mg by mouth daily.     gabapentin (NEURONTIN) 300 MG capsule TAKE 1 CAPSULE BY MOUTH IN THE MORNING AND LUNCH, AND 2 AT BEDTIME 120 capsule 6   glucose blood (ONETOUCH VERIO) test strip USE TO TEST BLOOD SUGAR THREE TIMES DAILY 100 strip 2   hydrOXYzine (ATARAX/VISTARIL) 25 MG tablet TAKE 1 TABLET (25 MG TOTAL) BY MOUTH 3 (THREE) TIMES DAILY AS NEEDED FOR ITCHING (RASH). 30 tablet 0   ibuprofen (ADVIL) 800 MG tablet TAKE 1 TABLET BY MOUTH 3 TIMES DAILY 15 tablet 0   insulin aspart protamine - aspart (NOVOLOG MIX 70/30 FLEXPEN)  (70-30) 100 UNIT/ML FlexPen INJECT 6 UNITS INTO THE SKIN 2 (TWO) TIMES DAILY WITH A MEAL. 3 mL 6   Insulin Pen Needle 32G X 4 MM MISC USE AS DIRECTED TWICE DAILY WITH INSULIN 100 each 6   ixazomib citrate (NINLARO) 3 MG capsule Take 1 capsule (3 mg) by mouth weekly, 3 weeks on, 1 week off, repeat every 4 weeks. Take on an empty stomach 1hr before or 2hr after meals. 3 capsule 3   lidocaine (LIDODERM) 5 %      lidocaine (LIDODERM) 5 % PLACE 1 PATCH ONTO SKIN. REMOVE AFTER 12 HOURS EXTERNAL ONCE A DAY 30 DAY(S) 30 patch 2   metaxalone (SKELAXIN) 800 MG tablet TAKE 1 TABLET (800 MG TOTAL) BY MOUTH AT BEDTIME AS NEEDED FOR MUSCLE SPASMS. 30 tablet 3   Miconazole Nitrate (ATHLETES FOOT POWDER SPRAY) 2 % AERP Spray between toes and under toes once daily. 130 g 4   Multiple Vitamin (MULTIVITAMIN WITH MINERALS) TABS tablet Take 1 tablet by mouth daily. 60 tablet 2   OneTouch Delica Lancets 75T MISC Use to test blood sugar TID. E11.2 100 each 2   tadalafil (CIALIS) 20 MG tablet Take 20 mg by mouth as needed.     vitamin B-12 (CYANOCOBALAMIN) 100 MCG tablet Take 100 mcg by mouth daily.     Vitamins/Minerals TABS Take by mouth.     No current facility-administered medications for this visit.    REVIEW OF SYSTEMS:   .10 Point review of Systems was done is negative except as noted above.   PHYSICAL EXAMINATION: ECOG PERFORMANCE STATUS: 1 - Symptomatic but completely ambulatory Exam was given in a chair  .BP 129/67   Pulse 74   Temp 98.4 F (36.9 C)   Resp 18   Wt 164 lb 3.2 oz (74.5 kg)   SpO2 100%   BMI 25.72 kg/m   . GENERAL:alert, in no acute distress and comfortable SKIN: no acute rashes, no significant lesions EYES: conjunctiva are pink and non-injected, sclera anicteric OROPHARYNX: MMM, no exudates, no oropharyngeal erythema or ulceration NECK: supple, no JVD LYMPH:  no palpable lymphadenopathy in the cervical,  axillary or inguinal regions LUNGS: clear to auscultation b/l with normal  respiratory effort HEART: regular rate & rhythm ABDOMEN:  normoactive bowel sounds , non tender, not distended. Extremity: no pedal edema PSYCH: alert & oriented x 3 with fluent speech NEURO: no focal motor/sensory deficits   LABORATORY DATA:  I have reviewed the data as listed  . CBC Latest Ref Rng & Units 03/04/2021 11/16/2020 08/19/2020  WBC 4.0 - 10.5 K/uL 6.4 5.9 5.8  Hemoglobin 13.0 - 17.0 g/dL 12.6(L) 13.6 11.6(L)  Hematocrit 39.0 - 52.0 % 35.4(L) 39.5 33.6(L)  Platelets 150 - 400 K/uL 197 200 190    . CMP Latest Ref Rng & Units 03/04/2021 11/16/2020 08/19/2020  Glucose 70 - 99 mg/dL 201(H) 192(H) 181(H)  BUN 8 - 23 mg/dL 17 17 25(H)  Creatinine 0.61 - 1.24 mg/dL 1.50(H) 1.51(H) 1.63(H)  Sodium 135 - 145 mmol/L 138 139 136  Potassium 3.5 - 5.1 mmol/L 3.8 3.9 3.8  Chloride 98 - 111 mmol/L 106 103 105  CO2 22 - 32 mmol/L _0 Calcium 8.9 - 10.3 mg/dL 9.4 9.7 9.2  Total Protein 6.5 - 8.1 g/dL 7.0 7.7 7.2  Total Bilirubin 0.3 - 1.2 mg/dL 0.8 0.9 0.6  Alkaline Phos 38 - 126 U/L 101 71 91  AST 15 - 41 U/L _1 ALT 0 - 44 U/L 26 32 26   08/29/17 BM Bx:    08/29/17 Cytogenetics:   03/13/17 Cytogenetics:          RADIOGRAPHIC STUDIES: I have personally reviewed the radiological images as listed and agreed with the findings in the report. No results found.  ASSESSMENT & PLAN:   75 y.o. male with   1. IgA Lambda Multiple Myeloma ISS Stage I - currently in remission   Active disease was previously diagnosed based on presence of anemia, kidney insufficiency, and paraproteinemia with significant predominance of lambda light chains as well as significant elevation of IgA.   PLAN:  -Discussed pt labwork today, 03/04/2021 -CBC and CMP stable no hypercalcemia no significant anemia. -Myeloma panel pending  -Discussed [place] and pt's eligibility. The pt wishes to hold on the referral at this time. -Recommended patient get his annual flu shot and new COVID-19  booster vaccine when that is released. -Continue B-complex and Vitamin D daily. -Will see back in 3 months with labs.   FOLLOW UP: RTC with Dr Irene Limbo in 2 months Labs 1 week prior to clinic visit   The total time spent in the appointment was 20 minutes and more than 50% was on counseling and direct patient cares.  All of the patient's questions were answered with apparent satisfaction. The patient knows to call the clinic with any problems, questions or concerns.   Sullivan Lone MD [place] AAHIVMS [facility] Hematology/Oncology Physician [facility]  (Office):       [phone] (Work cell):  [phone] (Fax):           [phone]

## 2021-03-15 ENCOUNTER — Other Ambulatory Visit: Payer: Self-pay

## 2021-03-17 ENCOUNTER — Other Ambulatory Visit: Payer: Self-pay

## 2021-03-21 ENCOUNTER — Other Ambulatory Visit: Payer: Self-pay

## 2021-03-30 ENCOUNTER — Other Ambulatory Visit: Payer: Self-pay

## 2021-03-30 MED FILL — Lidocaine Patch 5%: CUTANEOUS | 30 days supply | Qty: 30 | Fill #0 | Status: CN

## 2021-03-31 ENCOUNTER — Other Ambulatory Visit: Payer: Self-pay

## 2021-03-31 MED FILL — Lidocaine Patch 5%: CUTANEOUS | 30 days supply | Qty: 30 | Fill #0 | Status: CN

## 2021-04-01 ENCOUNTER — Other Ambulatory Visit: Payer: Self-pay

## 2021-04-01 ENCOUNTER — Other Ambulatory Visit: Payer: Self-pay | Admitting: Internal Medicine

## 2021-04-01 ENCOUNTER — Encounter: Payer: Self-pay | Admitting: Internal Medicine

## 2021-04-01 MED ORDER — TIZANIDINE HCL 2 MG PO TABS
2.0000 mg | ORAL_TABLET | Freq: Every evening | ORAL | 1 refills | Status: DC | PRN
  Filled 2021-04-01: qty 30, 30d supply, fill #0
  Filled 2021-06-06: qty 30, 30d supply, fill #1

## 2021-04-01 NOTE — Progress Notes (Signed)
Received message from [employer] in our pharmacy that patient's insurance will no longer pay for Skelaxin.  Preferred medication is tizanidine.  Patient was calling for refill.  I will change to tizanidine.

## 2021-04-04 DIAGNOSIS — D631 Anemia in chronic kidney disease: Secondary | ICD-10-CM | POA: Diagnosis not present

## 2021-04-04 DIAGNOSIS — N2581 Secondary hyperparathyroidism of renal origin: Secondary | ICD-10-CM | POA: Diagnosis not present

## 2021-04-04 DIAGNOSIS — I129 Hypertensive chronic kidney disease with stage 1 through stage 4 chronic kidney disease, or unspecified chronic kidney disease: Secondary | ICD-10-CM | POA: Diagnosis not present

## 2021-04-04 DIAGNOSIS — N183 Chronic kidney disease, stage 3 unspecified: Secondary | ICD-10-CM | POA: Diagnosis not present

## 2021-04-04 DIAGNOSIS — C9000 Multiple myeloma not having achieved remission: Secondary | ICD-10-CM | POA: Diagnosis not present

## 2021-04-04 DIAGNOSIS — Z23 Encounter for immunization: Secondary | ICD-10-CM | POA: Diagnosis not present

## 2021-04-04 DIAGNOSIS — E1122 Type 2 diabetes mellitus with diabetic chronic kidney disease: Secondary | ICD-10-CM | POA: Diagnosis not present

## 2021-04-05 ENCOUNTER — Other Ambulatory Visit: Payer: Self-pay

## 2021-04-05 MED FILL — Lidocaine Patch 5%: CUTANEOUS | 30 days supply | Qty: 30 | Fill #0 | Status: CN

## 2021-04-06 ENCOUNTER — Other Ambulatory Visit: Payer: Self-pay

## 2021-04-07 ENCOUNTER — Other Ambulatory Visit: Payer: Self-pay

## 2021-04-07 ENCOUNTER — Other Ambulatory Visit: Payer: Self-pay | Admitting: Pharmacist

## 2021-04-07 ENCOUNTER — Telehealth: Payer: Self-pay | Admitting: Internal Medicine

## 2021-04-07 MED ORDER — INSULIN LISPRO PROT & LISPRO (75-25 MIX) 100 UNIT/ML KWIKPEN
6.0000 [IU] | PEN_INJECTOR | Freq: Two times a day (BID) | SUBCUTANEOUS | 2 refills | Status: DC
  Filled 2021-04-07: qty 9, 75d supply, fill #0
  Filled 2021-06-16: qty 9, 75d supply, fill #1
  Filled 2021-07-18 – 2021-08-15 (×2): qty 9, 75d supply, fill #0

## 2021-04-07 NOTE — Telephone Encounter (Signed)
Copied from [place] [id number]. Topic: General - Other >> Apr 07, 2021 10:35 AM Robert Sparks wrote: Reason for CRM: Patient called in asking Dr Wynetta Emery to call him when she have Sparks chance stated that he can not get his insulin because it have been denied and he would like to get some answers and some information. Please call patient at Ph# [phone]

## 2021-04-07 NOTE — Telephone Encounter (Signed)
Patient's insurance prefers Humalog 75/25. Rx sent.

## 2021-04-07 NOTE — Telephone Encounter (Signed)
Lurena Joiner could you follow up with pt

## 2021-04-14 ENCOUNTER — Other Ambulatory Visit: Payer: Self-pay

## 2021-04-14 MED FILL — Insulin Pen Needle 32 G X 4 MM (1/6" or 5/32"): 50 days supply | Qty: 100 | Fill #2 | Status: AC

## 2021-04-15 DIAGNOSIS — N183 Chronic kidney disease, stage 3 unspecified: Secondary | ICD-10-CM | POA: Diagnosis not present

## 2021-04-18 ENCOUNTER — Other Ambulatory Visit: Payer: Self-pay

## 2021-04-22 ENCOUNTER — Encounter: Payer: Self-pay | Admitting: Podiatry

## 2021-04-22 ENCOUNTER — Ambulatory Visit (INDEPENDENT_AMBULATORY_CARE_PROVIDER_SITE_OTHER): Payer: Medicare Other | Admitting: Podiatry

## 2021-04-22 ENCOUNTER — Other Ambulatory Visit: Payer: Self-pay

## 2021-04-22 DIAGNOSIS — B351 Tinea unguium: Secondary | ICD-10-CM | POA: Diagnosis not present

## 2021-04-22 DIAGNOSIS — M79674 Pain in right toe(s): Secondary | ICD-10-CM | POA: Diagnosis not present

## 2021-04-22 DIAGNOSIS — M79675 Pain in left toe(s): Secondary | ICD-10-CM

## 2021-04-22 DIAGNOSIS — L84 Corns and callosities: Secondary | ICD-10-CM

## 2021-04-22 DIAGNOSIS — E1142 Type 2 diabetes mellitus with diabetic polyneuropathy: Secondary | ICD-10-CM

## 2021-04-22 DIAGNOSIS — Q828 Other specified congenital malformations of skin: Secondary | ICD-10-CM

## 2021-04-24 NOTE — Progress Notes (Signed)
Subjective: Robert Sparks is a 75 y.o. male patient seen today for at risk foot care with h/o diabetic neuropathy. He is seen for follow up of  calluses and painful thick toenails that are difficult to trim. Pain interferes with ambulation. Aggravating factors include wearing enclosed shoe gear. Pain is relieved with periodic professional debridement.  New problems reported today: None.  Patient states their blood glucose was 125 mg/dl today.   PCP is [name], MD. Last visit was: 01/04/2021.  Allergies  Allergen Reactions   Other Other (See Comments)    Nicoderm CQ = Bad Dreams Nicotine gum = Bad Dreams     PCP is [name], MD .  Objective: Physical Exam  General: Patient is a pleasant 75 y.o. African American male WD, WN in NAD. AAO x 3.   Neurovascular Examination: Capillary refill time to digits immediate b/l. Palpable pedal pulses b/l LE. Pedal hair absent. Lower extremity skin temperature gradient within normal limits. No pain with calf compression b/l. No edema noted b/l lower extremities.  Protective sensation diminished with 10g monofilament b/l.  Dermatological:  Skin warm and supple b/l lower extremities. No open wounds b/l LE. No interdigital macerations b/l lower extremities. Toenails 1-5 b/l elongated, discolored, dystrophic, thickened, crumbly with subungual debris and tenderness to dorsal palpation. Hyperkeratotic lesion(s) 1st metatarsal head left foot.  No erythema, no edema, no drainage, no fluctuance. Porokeratotic lesion(s) R hallux. No erythema, no edema, no drainage, no fluctuance.  Musculoskeletal:  Normal muscle strength 5/5 to all lower extremity muscle groups bilaterally. Hallux valgus with bunion deformity noted b/l lower extremities.  Assessment: 1. Pain due to onychomycosis of toenails of both feet   2. Callus   3. Porokeratosis   4. Diabetic peripheral neuropathy associated with type 2 diabetes mellitus ([place])     Plan: Patient was evaluated and treated and all questions answered. Consent given for treatment as described below: -Examined patient. -Continue diabetic foot care principles: inspect feet daily, monitor glucose as recommended by PCP and/or Endocrinologist, and follow prescribed diet per PCP, Endocrinologist and/or dietician. -Patient to continue soft, supportive shoe gear daily. -Toenails 1-5 b/l were debrided in length and girth with sterile nail nippers and dremel without iatrogenic bleeding.  -Callus(es) 1st metatarsal head left foot pared utilizing sterile scalpel blade without complication or incident. Total number debrided =1. -Painful porokeratotic lesion(s) R hallux pared and enucleated with sterile scalpel blade without incident. Total number of lesions debrided=1. -Patient to report any pedal injuries to medical professional immediately. -Patient/POA to call should there be question/concern in the interim.  Return in about 3 months (around 07/23/2021).  Marzetta Board, DPM

## 2021-04-26 ENCOUNTER — Other Ambulatory Visit: Payer: Self-pay

## 2021-04-26 ENCOUNTER — Inpatient Hospital Stay: Payer: Medicare Other | Attending: Hematology

## 2021-04-26 DIAGNOSIS — D649 Anemia, unspecified: Secondary | ICD-10-CM | POA: Diagnosis not present

## 2021-04-26 DIAGNOSIS — C9001 Multiple myeloma in remission: Secondary | ICD-10-CM | POA: Insufficient documentation

## 2021-04-26 DIAGNOSIS — N189 Chronic kidney disease, unspecified: Secondary | ICD-10-CM | POA: Diagnosis not present

## 2021-04-26 LAB — CBC WITH DIFFERENTIAL (CANCER CENTER ONLY)
Abs Immature Granulocytes: 0.01 10*3/uL (ref 0.00–0.07)
Basophils Absolute: 0 10*3/uL (ref 0.0–0.1)
Basophils Relative: 1 %
Eosinophils Absolute: 0.9 10*3/uL — ABNORMAL HIGH (ref 0.0–0.5)
Eosinophils Relative: 15 %
HCT: 34.9 % — ABNORMAL LOW (ref 39.0–52.0)
Hemoglobin: 12.2 g/dL — ABNORMAL LOW (ref 13.0–17.0)
Immature Granulocytes: 0 %
Lymphocytes Relative: 21 %
Lymphs Abs: 1.3 10*3/uL (ref 0.7–4.0)
MCH: 34.8 pg — ABNORMAL HIGH (ref 26.0–34.0)
MCHC: 35 g/dL (ref 30.0–36.0)
MCV: 99.4 fL (ref 80.0–100.0)
Monocytes Absolute: 0.4 10*3/uL (ref 0.1–1.0)
Monocytes Relative: 6 %
Neutro Abs: 3.4 10*3/uL (ref 1.7–7.7)
Neutrophils Relative %: 57 %
Platelet Count: 169 10*3/uL (ref 150–400)
RBC: 3.51 MIL/uL — ABNORMAL LOW (ref 4.22–5.81)
RDW: 13.2 % (ref 11.5–15.5)
WBC Count: 5.9 10*3/uL (ref 4.0–10.5)
nRBC: 0 % (ref 0.0–0.2)

## 2021-04-26 LAB — CMP (CANCER CENTER ONLY)
ALT: 22 U/L (ref 0–44)
AST: 22 U/L (ref 15–41)
Albumin: 3.9 g/dL (ref 3.5–5.0)
Alkaline Phosphatase: 69 U/L (ref 38–126)
Anion gap: 12 (ref 5–15)
BUN: 14 mg/dL (ref 8–23)
CO2: 23 mmol/L (ref 22–32)
Calcium: 9.5 mg/dL (ref 8.9–10.3)
Chloride: 104 mmol/L (ref 98–111)
Creatinine: 1.51 mg/dL — ABNORMAL HIGH (ref 0.61–1.24)
GFR, Estimated: 48 mL/min — ABNORMAL LOW (ref >60)
Glucose, Bld: 228 mg/dL — ABNORMAL HIGH (ref 70–99)
Potassium: 3.6 mmol/L (ref 3.5–5.1)
Sodium: 139 mmol/L (ref 135–145)
Total Bilirubin: 1.1 mg/dL (ref 0.3–1.2)
Total Protein: 7.4 g/dL (ref 6.5–8.1)

## 2021-04-27 LAB — KAPPA/LAMBDA LIGHT CHAINS
Kappa free light chain: 49.3 mg/L — ABNORMAL HIGH (ref 3.3–19.4)
Kappa, lambda light chain ratio: 0.3 (ref 0.26–1.65)
Lambda free light chains: 164.8 mg/L — ABNORMAL HIGH (ref 5.7–26.3)

## 2021-04-29 ENCOUNTER — Ambulatory Visit: Payer: Medicare Other | Admitting: Podiatry

## 2021-04-29 LAB — MULTIPLE MYELOMA PANEL, SERUM
Albumin SerPl Elph-Mcnc: 3.9 g/dL (ref 2.9–4.4)
Albumin/Glob SerPl: 1.4 (ref 0.7–1.7)
Alpha 1: 0.2 g/dL (ref 0.0–0.4)
Alpha2 Glob SerPl Elph-Mcnc: 0.8 g/dL (ref 0.4–1.0)
B-Globulin SerPl Elph-Mcnc: 1.1 g/dL (ref 0.7–1.3)
Gamma Glob SerPl Elph-Mcnc: 0.8 g/dL (ref 0.4–1.8)
Globulin, Total: 3 g/dL (ref 2.2–3.9)
IgA: 643 mg/dL — ABNORMAL HIGH (ref 61–437)
IgG (Immunoglobin G), Serum: 886 mg/dL (ref 603–1613)
IgM (Immunoglobulin M), Srm: 84 mg/dL (ref 15–143)
Total Protein ELP: 6.9 g/dL (ref 6.0–8.5)

## 2021-05-02 ENCOUNTER — Other Ambulatory Visit: Payer: Self-pay

## 2021-05-03 ENCOUNTER — Inpatient Hospital Stay (HOSPITAL_BASED_OUTPATIENT_CLINIC_OR_DEPARTMENT_OTHER): Payer: Medicare Other | Admitting: Hematology

## 2021-05-03 ENCOUNTER — Other Ambulatory Visit: Payer: Self-pay

## 2021-05-03 VITALS — BP 133/69 | HR 73 | Temp 98.5°F | Resp 18 | Ht 67.0 in | Wt 166.3 lb

## 2021-05-03 DIAGNOSIS — D649 Anemia, unspecified: Secondary | ICD-10-CM | POA: Diagnosis not present

## 2021-05-03 DIAGNOSIS — C9002 Multiple myeloma in relapse: Secondary | ICD-10-CM | POA: Diagnosis not present

## 2021-05-03 DIAGNOSIS — C9001 Multiple myeloma in remission: Secondary | ICD-10-CM | POA: Diagnosis not present

## 2021-05-03 DIAGNOSIS — N189 Chronic kidney disease, unspecified: Secondary | ICD-10-CM | POA: Diagnosis not present

## 2021-05-04 ENCOUNTER — Telehealth: Payer: Self-pay | Admitting: Hematology

## 2021-05-04 NOTE — Telephone Encounter (Signed)
Scheduled follow-up appointment per 10/25 los. Patient is aware.

## 2021-05-09 NOTE — Progress Notes (Addendum)
HEMATOLOGY/ONCOLOGY CLINIC NOTE  Date of Service: .05/03/2021   Patient Care Team: [name], MD as PCP - General (Internal Medicine) [name], MD (Inactive) as Consulting Physician (Hematology and Oncology)  CHIEF COMPLAINTS/PURPOSE OF CONSULTATION:  F/u for continued mx of Multiple Myeloma  Oncologic History:  75 y.o. male with diagnosis of IgA lambda active multiple myeloma, ISS Stage I. Active disease diagnosed based on presence of anemia, kidney insufficiency, and paraproteinemia with significant predominance of lambda light chains as well as significant elevation of IgA. Bone marrow biopsy confirmed presence of atypical monoclonal plasma cell process in the bone marrow comprising at least 7% of the cellularity. Based on the findings, patient was started on treatment with lenalidomide, bortezomib, and low-dose dexamethasone based on the anticipated tolerance by the patient. Lenalidomide was started at 10 mg daily based on creatinine clearance of 42, dexamethasone dose was reduced to 20 mg weekly based on patient's age. Treatment was complicated by rapid development of cutaneous rash attributed to lenalidomide, inaddition to decreased renal function and now patient has persistent severe anemia. Side-effects were attributed to Lenalidomide and lenalidomide was discontinued with subsequent recovery.  Patient has been receiving bortezomib weekly with low-dose dexamethasone in 4-day cycles.   Patient has completed induction systemic therapy for his disease with repeat bone marrow biopsy confirming complete response including no evidence of minimal residual disease by cytogenetics or FISH.  HISTORY OF PRESENTING ILLNESS:   Robert Sparks is a wonderful 75 y.o. male who has been referred to [place] by my colleague Dr Grace Isaac for evaluation and management of Multiple Myeloma. The pt reports that he is doing well overall.   The pt has been previously followed by my  colleague Dr Grace Isaac. The pt was treated with Revlimid, Velcade, and dexamethasone and was subsequently switched to Velcade and Dexamethasone after a Revlimid intolerance.   The pt reports some knee aches and skin soreness in his legs which is worse at night. He notes that he has had diabetic-related peripheral neuropathy. The pt has not seen [facility] yet for a discussion of a BM transplant. He continues taking Acyclovir and notes no difficulty taking maintenance Velcade.   He adds that he takes Gabapentin and sweats.  He notes that his neuropathy is currently stable and denies it worsening.   He notes that a pack of cigarettes lasts him for about 3 days.   Most recent lab results (12/14/17) of CBC and CMP  is as follows: all values are WNL except for RBC at 3.08, HGB at 10.1, HCT at 31.0, MCV at 100.6, Eosinophils Abs at 1.4k, Potassium at 3.4, Glucose at 156, Creatinine at 1.60. LDH 12/14/17 is 141 Beta 2 microglobulin 12/14/17 is 2.4  On review of systems, pt reports knee pain, night time LE skin soreness, peripheral neuropathy, eating well, strong appetite, intermittent sweating and denies fevers, chills, back pains, abdominal pains, and any other symptoms.   [name] DM type 2, HTN, CKD. On Social Hx the pt reports smoking a pack of cigarettes every 3 days.   [name] returns today regarding management and evaluation of his Multiple Myeloma. [name] was on 03/04/2021.   Patient notes no acute new symptoms.  Neuropathy is stable.  No new bone pains.  No fevers no chills no night sweats no new fatigue.  Eating well.  No other acute new symptoms.  Lab results today 05/03/2021  CBC hemoglobin of 12.2 with normal WBC count and platelets  CMP shows stable chronic kidney disease with creatinine of 1.51 no hypercalcemia Serum free kappa lambda light Chains normal ratio MMP IgA lambda monoclonal protein detected but not  quantifiable due to coagulation with of the proteins.  IgA levels are progressively increasing to 643.  On review of systems, pt  denies worsening neuropathy, abdominal pain, leg swelling, diarrhea, and any other symptoms.  MEDICAL HISTORY:  Past Medical History:  Diagnosis Date   Anemia    Arthritis    shoulders,feet    Cataract    per eye dr -has appt 5-11   CKD (chronic kidney disease), stage III (HCC)    Elevated PSA    History of ketoacidosis    03-29-2015    History of sepsis    07-25-2013  non-compliant w/ medication   Hyperlipidemia    Hypertension    Nocturia    Peripheral neuropathy    Prostate cancer ([place])    Type 2 diabetes mellitus with insulin therapy [facility])     SURGICAL HISTORY: Past Surgical History:  Procedure Laterality Date   CIRCUMCISION  1990's   PROSTATE BIOPSY N/A 12/21/2015   Procedure: BIOPSY TRANSRECTAL ULTRASONIC PROSTATE (TUBP);  Surgeon: Festus Aloe, MD;  [facility];  Service: Urology;  Laterality: N/A;    SOCIAL HISTORY: Social History   Socioeconomic History   Marital status: Single    Spouse name: Not on file   Number of children: Not on file   Years of education: Not on file   Highest education level: Not on file  Occupational History   Not on file  Tobacco Use   Smoking status: Every Day    Packs/day: 0.50    Years: 50.00    Pack years: 25.00    Types: Cigarettes   Smokeless tobacco: Never   Tobacco comments:    1 ppwk-4-5 a day   Vaping Use   Vaping Use: Never used  Substance and Sexual Activity   Alcohol use: Yes    Alcohol/week: 1.0 standard drink    Types: 1 Cans of beer per week    Comment: 1 every day-quit couple weeks ago   Drug use: No   Sexual activity: Not Currently  Other Topics Concern   Not on file  Social History Narrative   Not on file   Social Determinants of Health   Financial Resource Strain: Not on file  Food Insecurity: Not on file  Transportation Needs: Not on  file  Physical Activity: Not on file  Stress: Not on file  Social Connections: Not on file  Intimate Partner Violence: Not on file    FAMILY HISTORY: Family History  Problem Relation Age of Onset   Kidney disease Mother    Cancer Neg Hx    Colon cancer Neg Hx    Colon polyps Neg Hx    Esophageal cancer Neg Hx    Rectal cancer Neg Hx    Stomach cancer Neg Hx     ALLERGIES:  is allergic to other.  MEDICATIONS:  Current Outpatient Medications  Medication Sig Dispense Refill   pravastatin (PRAVACHOL) 20 MG tablet TAKE 1 TABLET (20 MG TOTAL) BY MOUTH DAILY. 90 tablet 0   acyclovir (ZOVIRAX) 400 MG tablet TAKE 1 TABLET (400 MG TOTAL) BY MOUTH 2 (TWO) TIMES DAILY. 60 tablet 5   amLODipine (NORVASC) 10 MG tablet TAKE 1 TABLET (10 MG TOTAL) BY MOUTH DAILY. 30 tablet 6  aspirin EC 81 MG tablet Take 1 tablet (81 mg total) by mouth daily. 90 tablet 3   b complex vitamins capsule Take 1 capsule by mouth daily. 30 capsule 5   Blood Glucose Monitoring Suppl (ONETOUCH VERIO) w/Device KIT Use to test blood sugar TID. E11.2 1 kit 0   buPROPion (WELLBUTRIN SR) 150 MG 12 hr tablet      Continuous Blood Gluc Sensor (FREESTYLE LIBRE SENSOR SYSTEM) MISC Change sensor Q 2 wks 2 each 12   diclofenac Sodium (VOLTAREN) 1 % GEL Apply 4 g topically 4 (four) times daily. 150 g 5   donepezil (ARICEPT) 5 MG tablet TAKE 1 TABLET (5 MG TOTAL) BY MOUTH AT BEDTIME. FOR MEMORY ISSUES 30 tablet 2   ferrous sulfate 325 (65 FE) MG tablet Take 325 mg by mouth daily.     gabapentin (NEURONTIN) 300 MG capsule TAKE 1 CAPSULE BY MOUTH IN THE MORNING AND LUNCH, AND 2 AT BEDTIME 120 capsule 6   glucose blood (ONETOUCH VERIO) test strip USE TO TEST BLOOD SUGAR THREE TIMES DAILY 100 strip 2   hydrOXYzine (ATARAX/VISTARIL) 25 MG tablet TAKE 1 TABLET (25 MG TOTAL) BY MOUTH 3 (THREE) TIMES DAILY AS NEEDED FOR ITCHING (RASH). 30 tablet 0   ibuprofen (ADVIL) 800 MG tablet TAKE 1 TABLET BY MOUTH 3 TIMES DAILY 15 tablet 0   Insulin  Lispro Prot & Lispro (HUMALOG MIX 75/25 KWIKPEN) (75-25) 100 UNIT/ML Kwikpen Inject 6 Units into the skin in the morning and at bedtime. 15 mL 2   Insulin Pen Needle 32G X 4 MM MISC USE AS DIRECTED TWICE DAILY WITH INSULIN 100 each 6   ixazomib citrate (NINLARO) 3 MG capsule Take 1 capsule (3 mg) by mouth weekly, 3 weeks on, 1 week off, repeat every 4 weeks. Take on an empty stomach 1hr before or 2hr after meals. 3 capsule 3   lidocaine (LIDODERM) 5 %      lidocaine (LIDODERM) 5 % PLACE 1 PATCH ONTO SKIN. REMOVE AFTER 12 HOURS EXTERNAL ONCE A DAY 30 DAY(S) 30 patch 2   Miconazole Nitrate (ATHLETES FOOT POWDER SPRAY) 2 % AERP Spray between toes and under toes once daily. 130 g 4   Multiple Vitamin (MULTIVITAMIN WITH MINERALS) TABS tablet Take 1 tablet by mouth daily. 60 tablet 2   OneTouch Delica Lancets 03K MISC Use to test blood sugar TID. E11.2 100 each 2   tadalafil (CIALIS) 20 MG tablet Take 20 mg by mouth as needed.     tiZANidine (ZANAFLEX) 2 MG tablet Take 1 tablet (2 mg total) by mouth at bedtime as needed for muscle spasms. 30 tablet 1   vitamin B-12 (CYANOCOBALAMIN) 100 MCG tablet Take 100 mcg by mouth daily.     Vitamins/Minerals TABS Take by mouth.     No current facility-administered medications for this visit.    REVIEW OF SYSTEMS:   .10 Point review of Systems was done is negative except as noted above.    PHYSICAL EXAMINATION: ECOG PERFORMANCE STATUS: 1 - Symptomatic but completely ambulatory Exam was given in a chair  .BP 133/69 (BP Location: Left Arm, Patient Position: Sitting)   Pulse 73   Temp 98.5 F (36.9 C) (Oral)   Resp 18   Ht '5\' 7"'  (1.702 m)   Wt 166 lb 4.8 oz (75.4 kg)   SpO2 99%   BMI 26.05 kg/m   . GENERAL:alert, in no acute distress and comfortable SKIN: no acute rashes, no significant lesions EYES: conjunctiva are pink  and non-injected, sclera anicteric OROPHARYNX: MMM, no exudates, no oropharyngeal erythema or ulceration NECK: supple, no  JVD LYMPH:  no palpable lymphadenopathy in the cervical, axillary or inguinal regions LUNGS: clear to auscultation b/l with normal respiratory effort HEART: regular rate & rhythm ABDOMEN:  normoactive bowel sounds , non tender, not distended. Extremity: no pedal edema PSYCH: alert & oriented x 3 with fluent speech NEURO: no focal motor/sensory deficits    LABORATORY DATA:  I have reviewed the data as listed  . CBC Latest Ref Rng & Units 04/26/2021 03/04/2021 11/16/2020  WBC 4.0 - 10.5 K/uL 5.9 6.4 5.9  Hemoglobin 13.0 - 17.0 g/dL 12.2(L) 12.6(L) 13.6  Hematocrit 39.0 - 52.0 % 34.9(L) 35.4(L) 39.5  Platelets 150 - 400 K/uL 169 197 200    . CMP Latest Ref Rng & Units 04/26/2021 03/04/2021 11/16/2020  Glucose 70 - 99 mg/dL 228(H) 201(H) 192(H)  BUN 8 - 23 mg/dL '14 17 17  ' Creatinine 0.61 - 1.24 mg/dL 1.51(H) 1.50(H) 1.51(H)  Sodium 135 - 145 mmol/L 139 138 139  Potassium 3.5 - 5.1 mmol/L 3.6 3.8 3.9  Chloride 98 - 111 mmol/L 104 106 103  CO2 22 - 32 mmol/L '23 23 25  ' Calcium 8.9 - 10.3 mg/dL 9.5 9.4 9.7  Total Protein 6.5 - 8.1 g/dL 7.4 7.0 7.7  Total Bilirubin 0.3 - 1.2 mg/dL 1.1 0.8 0.9  Alkaline Phos 38 - 126 U/L 69 101 71  AST 15 - 41 U/L '22 22 30  ' ALT 0 - 44 U/L 22 26 32     08/29/17 BM Bx:    08/29/17 Cytogenetics:   03/13/17 Cytogenetics:          RADIOGRAPHIC STUDIES: I have personally reviewed the radiological images as listed and agreed with the findings in the report. No results found.  ASSESSMENT & PLAN:   75 y.o. male with   1. IgA Lambda Multiple Myeloma ISS Stage I - currently in remission   Active disease was previously diagnosed based on presence of anemia, kidney insufficiency, and paraproteinemia with significant predominance of lambda light chains as well as significant elevation of IgA.   PLAN:  -Discussed pt labwork  today 05/03/2021  CBC hemoglobin of 12.2 with normal WBC count and platelets  CMP shows stable chronic kidney disease  with creatinine of 1.51 no hypercalcemia Serum free kappa lambda light Chains normal ratio MMP IgA lambda monoclonal protein detected but not quantifiable due to coagulation with of the proteins.  IgA levels are progressively increasing to 643. -We discussed concern for myeloma recurrence/progression and need for reassessment with repeat bone marrow biopsy and PET CT scan which patient is agreeable to. -Continue B-complex and Vitamin D daily.  FOLLOW UP: PET/CT in 1 week CT bone marrow aspiration and Biopsy in 1 week RTC with Dr Irene Limbo in 3 weeks  . The total time spent in the appointment was 25 minutes and more than 50% was on counseling and direct patient cares.  All of the patient's questions were answered with apparent satisfaction. The patient knows to call the clinic with any problems, questions or concerns.   Sullivan Lone MD Alpha AAHIVMS [facility] Hematology/Oncology Physician [facility]

## 2021-05-10 ENCOUNTER — Other Ambulatory Visit: Payer: Self-pay

## 2021-05-10 ENCOUNTER — Other Ambulatory Visit: Payer: Self-pay | Admitting: Internal Medicine

## 2021-05-10 DIAGNOSIS — I10 Essential (primary) hypertension: Secondary | ICD-10-CM

## 2021-05-10 MED ORDER — AMLODIPINE BESYLATE 10 MG PO TABS
ORAL_TABLET | Freq: Every day | ORAL | 1 refills | Status: DC
  Filled 2021-05-10: qty 30, 30d supply, fill #0
  Filled 2021-06-20: qty 30, 30d supply, fill #1

## 2021-05-10 NOTE — Addendum Note (Signed)
Addended by: Sullivan Lone on: 05/10/2021 12:28 AM   Modules accepted: Orders

## 2021-05-10 NOTE — Telephone Encounter (Signed)
Requested Prescriptions  Pending Prescriptions Disp Refills  . amLODipine (NORVASC) 10 MG tablet 30 tablet 1    Sig: TAKE 1 TABLET (10 MG TOTAL) BY MOUTH DAILY.     Cardiovascular:  Calcium Channel Blockers Passed - 05/10/2021  9:24 AM      Passed - Last BP in normal range    BP Readings from Last 1 Encounters:  05/03/21 133/69         Passed - Valid encounter within last 6 months    Recent Outpatient Visits          4 months ago Type 2 diabetes mellitus with peripheral neuropathy [facility])   [place] Karle Plumber B, MD   8 months ago Type 2 diabetes mellitus with peripheral neuropathy [facility])   [place] [name], MD   11 months ago Medicare annual wellness visit, initial   [place], [place] J, NP   1 year ago Type 2 diabetes mellitus with peripheral neuropathy [facility])   [place] Karle Plumber B, MD   1 year ago Controlled type 2 diabetes mellitus with stage 3 chronic kidney disease, with long-term current use of insulin [facility])    [facility] [name], MD

## 2021-05-20 ENCOUNTER — Other Ambulatory Visit: Payer: Self-pay

## 2021-05-20 ENCOUNTER — Ambulatory Visit (HOSPITAL_COMMUNITY)
Admission: RE | Admit: 2021-05-20 | Discharge: 2021-05-20 | Disposition: A | Discharge status: Home / Self Care | Payer: Medicare Other | Source: Ambulatory Visit | Attending: Hematology | Admitting: Hematology

## 2021-05-20 ENCOUNTER — Other Ambulatory Visit: Payer: Self-pay | Admitting: Internal Medicine

## 2021-05-20 DIAGNOSIS — C9002 Multiple myeloma in relapse: Secondary | ICD-10-CM | POA: Insufficient documentation

## 2021-05-20 DIAGNOSIS — E1169 Type 2 diabetes mellitus with other specified complication: Secondary | ICD-10-CM

## 2021-05-20 DIAGNOSIS — C9000 Multiple myeloma not having achieved remission: Secondary | ICD-10-CM | POA: Diagnosis not present

## 2021-05-20 LAB — GLUCOSE, CAPILLARY: Glucose-Capillary: 157 mg/dL — ABNORMAL HIGH (ref 70–99)

## 2021-05-20 MED ORDER — FLUDEOXYGLUCOSE F - 18 (FDG) INJECTION
8.3000 | Freq: Once | INTRAVENOUS | Status: AC
  Administered 2021-05-20: 8.16 via INTRAVENOUS

## 2021-05-20 NOTE — Telephone Encounter (Signed)
Requested medications are due for refill today yes  Requested medications are on the active medication list yes  Last refill 02/21/21  Future visit scheduled no  Notes to clinic Has already had a curtesy refill and there is no upcoming appointment scheduled.Labs are from 04/25/2019, please assess. Requested Prescriptions  Pending Prescriptions Disp Refills   pravastatin (PRAVACHOL) 20 MG tablet 90 tablet 0    Sig: TAKE 1 TABLET (20 MG TOTAL) BY MOUTH DAILY.     Cardiovascular:  Antilipid - Statins Failed - 05/20/2021 11:01 AM      Failed - Total Cholesterol in normal range and within 360 days    Cholesterol, Total  Date Value Ref Range Status  04/25/2019 184 100 - 199 mg/dL Final          Failed - LDL in normal range and within 360 days    LDL Chol Calc (NIH)  Date Value Ref Range Status  04/25/2019 76 0 - 99 mg/dL Final          Failed - HDL in normal range and within 360 days    HDL  Date Value Ref Range Status  04/25/2019 88 >39 mg/dL Final          Failed - Triglycerides in normal range and within 360 days    Triglycerides  Date Value Ref Range Status  04/25/2019 114 0 - 149 mg/dL Final          Passed - Patient is not pregnant      Passed - Valid encounter within last 12 months    Recent Outpatient Visits           4 months ago Type 2 diabetes mellitus with peripheral neuropathy ([place])   [place], Neoma Laming B, MD   8 months ago Type 2 diabetes mellitus with peripheral neuropathy [facility])   [place] [name], MD   12 months ago Medicare annual wellness visit, initial   [place], [place] J, NP   1 year ago Type 2 diabetes mellitus with peripheral neuropathy [facility])   [place] Karle Plumber B, MD   1 year ago Controlled type 2 diabetes mellitus with stage 3 chronic kidney disease, with long-term current use of  insulin [facility])   [place] [facility] [name], MD

## 2021-05-23 ENCOUNTER — Other Ambulatory Visit: Payer: Self-pay

## 2021-05-23 ENCOUNTER — Other Ambulatory Visit: Payer: Self-pay | Admitting: Internal Medicine

## 2021-05-23 DIAGNOSIS — E1122 Type 2 diabetes mellitus with diabetic chronic kidney disease: Secondary | ICD-10-CM

## 2021-05-23 DIAGNOSIS — N183 Chronic kidney disease, stage 3 unspecified: Secondary | ICD-10-CM

## 2021-05-23 MED ORDER — ONETOUCH VERIO VI STRP
ORAL_STRIP | 2 refills | Status: DC
  Filled 2021-05-23: qty 100, fill #0
  Filled 2021-05-27 – 2021-07-18 (×3): qty 100, 33d supply, fill #0

## 2021-05-23 MED ORDER — PRAVASTATIN SODIUM 20 MG PO TABS
ORAL_TABLET | Freq: Every day | ORAL | 0 refills | Status: DC
  Filled 2021-05-23: qty 30, 30d supply, fill #0

## 2021-05-23 NOTE — Telephone Encounter (Signed)
Requested Prescriptions  Pending Prescriptions Disp Refills  . glucose blood (ONETOUCH VERIO) test strip 100 strip 2    Sig: USE TO TEST BLOOD SUGAR THREE TIMES DAILY     Endocrinology: Diabetes - Testing Supplies Passed - 05/23/2021  9:00 AM      Passed - Valid encounter within last 12 months    Recent Outpatient Visits          4 months ago Type 2 diabetes mellitus with peripheral neuropathy ([place])   [place], Neoma Laming B, MD   8 months ago Type 2 diabetes mellitus with peripheral neuropathy [facility])   [place] [name], MD   12 months ago Medicare annual wellness visit, initial   [place], [place] J, NP   1 year ago Type 2 diabetes mellitus with peripheral neuropathy [facility])   [place] Karle Plumber B, MD   1 year ago Controlled type 2 diabetes mellitus with stage 3 chronic kidney disease, with long-term current use of insulin [facility])   [place] [facility] [name], MD

## 2021-05-24 ENCOUNTER — Other Ambulatory Visit: Payer: Self-pay

## 2021-05-24 ENCOUNTER — Inpatient Hospital Stay: Payer: Medicare Other | Attending: Hematology | Admitting: Hematology

## 2021-05-24 VITALS — BP 136/73 | HR 75 | Temp 97.7°F | Resp 18 | Wt 166.5 lb

## 2021-05-24 DIAGNOSIS — C9002 Multiple myeloma in relapse: Secondary | ICD-10-CM | POA: Insufficient documentation

## 2021-05-25 ENCOUNTER — Other Ambulatory Visit: Payer: Self-pay

## 2021-05-25 ENCOUNTER — Telehealth: Payer: Self-pay | Admitting: Hematology

## 2021-05-25 NOTE — Telephone Encounter (Signed)
Scheduled follow-up appointments per 11/15 los. Patient is aware.

## 2021-05-27 ENCOUNTER — Other Ambulatory Visit: Payer: Self-pay

## 2021-05-31 ENCOUNTER — Other Ambulatory Visit: Payer: Self-pay

## 2021-06-06 ENCOUNTER — Other Ambulatory Visit: Payer: Self-pay

## 2021-06-06 ENCOUNTER — Other Ambulatory Visit: Payer: Self-pay | Admitting: Hematology

## 2021-06-06 MED ORDER — HYDROXYZINE HCL 25 MG PO TABS
25.0000 mg | ORAL_TABLET | Freq: Three times a day (TID) | ORAL | 0 refills | Status: DC | PRN
  Filled 2021-06-06: qty 30, 10d supply, fill #0

## 2021-06-06 NOTE — Progress Notes (Signed)
HEMATOLOGY/ONCOLOGY CLINIC NOTE  Date of Service: .05/24/2021   Patient Care Team: [name], MD as PCP - General (Internal Medicine) [name], MD (Inactive) as Consulting Physician (Hematology and Oncology)  CHIEF COMPLAINTS/PURPOSE OF CONSULTATION:  F/u for continued mx of Multiple Myeloma  Oncologic History:  75 y.o. male with diagnosis of IgA lambda active multiple myeloma, ISS Stage I. Active disease diagnosed based on presence of anemia, kidney insufficiency, and paraproteinemia with significant predominance of lambda light chains as well as significant elevation of IgA. Bone marrow biopsy confirmed presence of atypical monoclonal plasma cell process in the bone marrow comprising at least 7% of the cellularity. Based on the findings, patient was started on treatment with lenalidomide, bortezomib, and low-dose dexamethasone based on the anticipated tolerance by the patient. Lenalidomide was started at 10 mg daily based on creatinine clearance of 42, dexamethasone dose was reduced to 20 mg weekly based on patient's age. Treatment was complicated by rapid development of cutaneous rash attributed to lenalidomide, inaddition to decreased renal function and now patient has persistent severe anemia. Side-effects were attributed to Lenalidomide and lenalidomide was discontinued with subsequent recovery.  Patient has been receiving bortezomib weekly with low-dose dexamethasone in 4-day cycles.   Patient has completed induction systemic therapy for his disease with repeat bone marrow biopsy confirming complete response including no evidence of minimal residual disease by cytogenetics or FISH.  HISTORY OF PRESENTING ILLNESS:   Robert Sparks is a wonderful 75 y.o. male who has been referred to [place] by my colleague Dr Grace Isaac for evaluation and management of Multiple Myeloma. The pt reports that he is doing well overall.   The pt has been previously followed by my  colleague Dr Grace Isaac. The pt was treated with Revlimid, Velcade, and dexamethasone and was subsequently switched to Velcade and Dexamethasone after a Revlimid intolerance.   The pt reports some knee aches and skin soreness in his legs which is worse at night. He notes that he has had diabetic-related peripheral neuropathy. The pt has not seen [facility] yet for a discussion of a BM transplant. He continues taking Acyclovir and notes no difficulty taking maintenance Velcade.   He adds that he takes Gabapentin and sweats.  He notes that his neuropathy is currently stable and denies it worsening.   He notes that a pack of cigarettes lasts him for about 3 days.   Most recent lab results (12/14/17) of CBC and CMP  is as follows: all values are WNL except for RBC at 3.08, HGB at 10.1, HCT at 31.0, MCV at 100.6, Eosinophils Abs at 1.4k, Potassium at 3.4, Glucose at 156, Creatinine at 1.60. LDH 12/14/17 is 141 Beta 2 microglobulin 12/14/17 is 2.4  On review of systems, pt reports knee pain, night time LE skin soreness, peripheral neuropathy, eating well, strong appetite, intermittent sweating and denies fevers, chills, back pains, abdominal pains, and any other symptoms.   [name] DM type 2, HTN, CKD. On Social Hx the pt reports smoking a pack of cigarettes every 3 days.   [name] returns today regarding management and evaluation of his Multiple Myeloma. [name] was on 05/03/2021  Patient notes no acute new symptoms.  Neuropathy is stable.  No new bone pains.  No fevers no chills no night sweats no new fatigue.  Eating well.  No other acute new symptoms.  Patient is here to discuss  his PET CT scan and bone marrow biopsy.  His bone marrow biopsy has not been completed and is scheduled for 06/13/2021.  PET CT scan done on 05/20/2021 showed 1. No abnormal hypermetabolism in the head/neck, chest, abdomen or pelvis. 2.  Calcifications in the pancreas and likely within the duct, with slight ductal dilatation. Findings may be due to chronic calcific pancreatitis. CT abdomen without and with contrast (pancreatic protocol) could be performed in further evaluation, as clinically indicated as malignancy cannot be definitively excluded. 3. Probable small left adrenal adenoma. 4. Bladder wall thickening.    MEDICAL HISTORY:  Past Medical History:  Diagnosis Date   Anemia    Arthritis    shoulders,feet    Cataract    per eye dr -has appt 5-11   CKD (chronic kidney disease), stage III (HCC)    Elevated PSA    History of ketoacidosis    03-29-2015    History of sepsis    07-25-2013  non-compliant w/ medication   Hyperlipidemia    Hypertension    Nocturia    Peripheral neuropathy    Prostate cancer ([place])    Type 2 diabetes mellitus with insulin therapy [facility])     SURGICAL HISTORY: Past Surgical History:  Procedure Laterality Date   CIRCUMCISION  1990's   PROSTATE BIOPSY N/A 12/21/2015   Procedure: BIOPSY TRANSRECTAL ULTRASONIC PROSTATE (TUBP);  Surgeon: Festus Aloe, MD;  Location: [facility];  Service: Urology;  Laterality: N/A;    SOCIAL HISTORY: Social History   Socioeconomic History   Marital status: Single    Spouse name: Not on file   Number of children: Not on file   Years of education: Not on file   Highest education level: Not on file  Occupational History   Not on file  Tobacco Use   Smoking status: Every Day    Packs/day: 0.50    Years: 50.00    Pack years: 25.00    Types: Cigarettes   Smokeless tobacco: Never   Tobacco comments:    1 ppwk-4-5 a day   Vaping Use   Vaping Use: Never used  Substance and Sexual Activity   Alcohol use: Yes    Alcohol/week: 1.0 standard drink    Types: 1 Cans of beer per week    Comment: 1 every day-quit couple weeks ago   Drug use: No   Sexual activity: Not Currently  Other Topics Concern   Not on file  Social  History Narrative   Not on file   Social Determinants of Health   Financial Resource Strain: Not on file  Food Insecurity: Not on file  Transportation Needs: Not on file  Physical Activity: Not on file  Stress: Not on file  Social Connections: Not on file  Intimate Partner Violence: Not on file    FAMILY HISTORY: Family History  Problem Relation Age of Onset   Kidney disease Mother    Cancer Neg Hx    Colon cancer Neg Hx    Colon polyps Neg Hx    Esophageal cancer Neg Hx    Rectal cancer Neg Hx    Stomach cancer Neg Hx     ALLERGIES:  is allergic to other.  MEDICATIONS:  Current Outpatient Medications  Medication Sig Dispense Refill   acyclovir (ZOVIRAX) 400 MG tablet TAKE 1 TABLET (400 MG TOTAL) BY MOUTH 2 (TWO) TIMES DAILY. 60 tablet 5   amLODipine (NORVASC) 10 MG tablet TAKE 1 TABLET (10 MG TOTAL) BY MOUTH DAILY.  30 tablet 1   aspirin EC 81 MG tablet Take 1 tablet (81 mg total) by mouth daily. 90 tablet 3   b complex vitamins capsule Take 1 capsule by mouth daily. 30 capsule 5   Blood Glucose Monitoring Suppl (ONETOUCH VERIO) w/Device KIT Use to test blood sugar TID. E11.2 1 kit 0   buPROPion (WELLBUTRIN SR) 150 MG 12 hr tablet      Continuous Blood Gluc Sensor (FREESTYLE LIBRE SENSOR SYSTEM) MISC Change sensor Q 2 wks 2 each 12   diclofenac Sodium (VOLTAREN) 1 % GEL Apply 4 g topically 4 (four) times daily. 150 g 5   donepezil (ARICEPT) 5 MG tablet TAKE 1 TABLET (5 MG TOTAL) BY MOUTH AT BEDTIME. FOR MEMORY ISSUES 30 tablet 2   ferrous sulfate 325 (65 FE) MG tablet Take 325 mg by mouth daily.     gabapentin (NEURONTIN) 300 MG capsule TAKE 1 CAPSULE BY MOUTH IN THE MORNING AND LUNCH, AND 2 AT BEDTIME 120 capsule 6   glucose blood (ONETOUCH VERIO) test strip USE TO TEST BLOOD SUGAR THREE TIMES DAILY 100 strip 2   hydrOXYzine (ATARAX/VISTARIL) 25 MG tablet TAKE 1 TABLET (25 MG TOTAL) BY MOUTH 3 (THREE) TIMES DAILY AS NEEDED FOR ITCHING (RASH). 30 tablet 0   ibuprofen  (ADVIL) 800 MG tablet TAKE 1 TABLET BY MOUTH 3 TIMES DAILY 15 tablet 0   Insulin Lispro Prot & Lispro (HUMALOG MIX 75/25 KWIKPEN) (75-25) 100 UNIT/ML Kwikpen Inject 6 Units into the skin in the morning and at bedtime. 15 mL 2   Insulin Pen Needle 32G X 4 MM MISC USE AS DIRECTED TWICE DAILY WITH INSULIN 100 each 6   ixazomib citrate (NINLARO) 3 MG capsule Take 1 capsule (3 mg) by mouth weekly, 3 weeks on, 1 week off, repeat every 4 weeks. Take on an empty stomach 1hr before or 2hr after meals. 3 capsule 3   lidocaine (LIDODERM) 5 %      lidocaine (LIDODERM) 5 % PLACE 1 PATCH ONTO SKIN. REMOVE AFTER 12 HOURS EXTERNAL ONCE A DAY 30 DAY(S) 30 patch 2   Miconazole Nitrate (ATHLETES FOOT POWDER SPRAY) 2 % AERP Spray between toes and under toes once daily. 130 g 4   Multiple Vitamin (MULTIVITAMIN WITH MINERALS) TABS tablet Take 1 tablet by mouth daily. 60 tablet 2   OneTouch Delica Lancets 24M MISC Use to test blood sugar TID. E11.2 100 each 2   pravastatin (PRAVACHOL) 20 MG tablet TAKE 1 TABLET (20 MG TOTAL) BY MOUTH DAILY. 30 tablet 0   tadalafil (CIALIS) 20 MG tablet Take 20 mg by mouth as needed.     tiZANidine (ZANAFLEX) 2 MG tablet Take 1 tablet (2 mg total) by mouth at bedtime as needed for muscle spasms. 30 tablet 1   vitamin B-12 (CYANOCOBALAMIN) 100 MCG tablet Take 100 mcg by mouth daily.     Vitamins/Minerals TABS Take by mouth.     No current facility-administered medications for this visit.    .10 Point review of Systems was done is negative except as noted above.     PHYSICAL EXAMINATION: ECOG PERFORMANCE STATUS: 1 - Symptomatic but completely ambulatory Exam was given in a chair  .BP 136/73   Pulse 75   Temp 97.7 F (36.5 C)   Resp 18   Wt 166 lb 8 oz (75.5 kg)   SpO2 100%   BMI 26.08 kg/m   . [name] GENERAL:alert, in no acute distress and comfortable SKIN: no acute rashes, no  significant lesions EYES: conjunctiva are pink and non-injected, sclera anicteric OROPHARYNX: MMM,  no exudates, no oropharyngeal erythema or ulceration NECK: supple, no JVD LYMPH:  no palpable lymphadenopathy in the cervical, axillary or inguinal regions LUNGS: clear to auscultation b/l with normal respiratory effort HEART: regular rate & rhythm ABDOMEN:  normoactive bowel sounds , non tender, not distended. Extremity: no pedal edema PSYCH: alert & oriented x 3 with fluent speech NEURO: no focal motor/sensory deficits     LABORATORY DATA:  I have reviewed the data as listed  . CBC Latest Ref Rng & Units 04/26/2021 03/04/2021 11/16/2020  WBC 4.0 - 10.5 K/uL 5.9 6.4 5.9  Hemoglobin 13.0 - 17.0 g/dL 12.2(L) 12.6(L) 13.6  Hematocrit 39.0 - 52.0 % 34.9(L) 35.4(L) 39.5  Platelets 150 - 400 K/uL 169 197 200    . CMP Latest Ref Rng & Units 04/26/2021 03/04/2021 11/16/2020  Glucose 70 - 99 mg/dL 228(H) 201(H) 192(H)  BUN 8 - 23 mg/dL _0 Creatinine 0.61 - 1.24 mg/dL 1.51(H) 1.50(H) 1.51(H)  Sodium 135 - 145 mmol/L 139 138 139  Potassium 3.5 - 5.1 mmol/L 3.6 3.8 3.9  Chloride 98 - 111 mmol/L 104 106 103  CO2 22 - 32 mmol/L _1 Calcium 8.9 - 10.3 mg/dL 9.5 9.4 9.7  Total Protein 6.5 - 8.1 g/dL 7.4 7.0 7.7  Total Bilirubin 0.3 - 1.2 mg/dL 1.1 0.8 0.9  Alkaline Phos 38 - 126 U/L 69 101 71  AST 15 - 41 U/L _2 ALT 0 - 44 U/L 22 26 32     08/29/17 BM Bx:    08/29/17 Cytogenetics:   03/13/17 Cytogenetics:          RADIOGRAPHIC STUDIES: I have personally reviewed the radiological images as listed and agreed with the findings in the report. NM PET Image Restage (PS) Whole Body  Result Date: 05/21/2021 CLINICAL DATA:  Subsequent treatment strategy for multiple myeloma. EXAM: NUCLEAR MEDICINE PET WHOLE BODY TECHNIQUE: 8.2 mCi F-18 FDG was injected intravenously. Full-ring PET imaging was performed from the head to foot after the radiotracer. CT data was obtained and used for attenuation correction and anatomic localization. Fasting blood glucose: 157 mg/dl  COMPARISON:  03/09/2017. FINDINGS: Mediastinal blood pool activity: SUV max 3.1 HEAD/NECK: No abnormal hypermetabolism. Incidental CT findings: Mucosal thickening in the sphenoid sinuses. CHEST: No hypermetabolic mediastinal, hilar or axillary lymph nodes. No hypermetabolic pulmonary nodules. Incidental CT findings: Minimal atherosclerotic calcification of the aorta and aortic valve. Heart size normal. No pericardial or pleural effusion. Image quality in the lungs is degraded by respiratory motion and expiratory phase imaging. ABDOMEN/PELVIS: No abnormal hypermetabolism in the liver, adrenal glands, spleen or pancreas. No hypermetabolic lymph nodes. Incidental CT findings: Liver is unremarkable. Mild intrahepatic biliary ductal dilatation. Parenchymal and possible ductal pancreatic calcifications. Pancreatic duct is minimally dilated, 4 mm. Spleen and right adrenal gland are unremarkable. There may be a 9 mm low-density nodule in the left adrenal gland. Kidneys, stomach and bowel are otherwise grossly unremarkable. Atherosclerotic calcification of the aorta. Bladder wall thickening. SKELETON: No abnormal hypermetabolism. Incidental CT findings: Degenerative changes in the spine. EXTREMITIES: No abnormal hypermetabolism. Incidental CT findings: None. IMPRESSION: 1. No abnormal hypermetabolism in the head/neck, chest, abdomen or pelvis. 2. Calcifications in the pancreas and likely within the duct, with slight ductal dilatation. Findings may be due to chronic calcific pancreatitis. CT abdomen without and with contrast (pancreatic protocol) could be performed in further evaluation, as clinically indicated as  malignancy cannot be definitively excluded. 3. Probable small left adrenal adenoma. 4. Bladder wall thickening. 5.  Aortic atherosclerosis (ICD10-I70.0). Electronically Signed   By: Lorin Picket M.D.   On: 05/21/2021 08:05    ASSESSMENT & PLAN:   75 y.o. male with   1. IgA Lambda Multiple Myeloma ISS Stage  I - currently in remission   Active disease was previously diagnosed based on presence of anemia, kidney insufficiency, and paraproteinemia with significant predominance of lambda light chains as well as significant elevation of IgA.   PLAN:  -Discussed PET/CT 11/112022 1. No abnormal hypermetabolism in the head/neck, chest, abdomen or pelvis. 2. Calcifications in the pancreas and likely within the duct, with slight ductal dilatation. Findings may be due to chronic calcific pancreatitis. CT abdomen without and with contrast (pancreatic protocol) could be performed in further evaluation, as clinically indicated as malignancy cannot be definitively excluded. 3. Probable small left adrenal adenoma. 4. Bladder wall thickening.  Serum free kappa lambda light Chains normal ratio MMP IgA lambda monoclonal protein detected but not quantifiable due to coagulation with of the proteins.  IgA levels are progressively increasing to 643. -We discussed concern for myeloma recurrence/progression and need for reassessment with repeat bone marrow biopsy and PET CT scan which patient is agreeable to. -Continue B-complex and Vitamin D daily.  FOLLOW UP: Plz add lab appointment with Bone marrow biopsy on 12/5 RTC with Dr Irene Limbo 12/9  . The total time spent in the appointment was 20 minutes and more than 50% was on counseling and direct patient cares.  All of the patient's questions were answered with apparent satisfaction. The patient knows to call the clinic with any problems, questions or concerns.   Sullivan Lone MD [place] AAHIVMS [facility] Hematology/Oncology Physician [facility]

## 2021-06-06 NOTE — Addendum Note (Signed)
Addended by: Sullivan Lone on: 06/06/2021 03:16 AM   Modules accepted: Orders, Level of Service

## 2021-06-10 ENCOUNTER — Other Ambulatory Visit: Payer: Self-pay | Admitting: Internal Medicine

## 2021-06-10 NOTE — H&P (Signed)
Chief Complaint: Patient was seen in consultation today for bone marrow biopsy with aspiration  Referring Physician(s): Brunetta Genera  Supervising Physician: Markus Daft  Patient Status: [facility] - Out-pt  History of Present Illness: Robert Sparks is a 75 y.o. male with a medical history significant for anemia, chronic kidney disease stage III, HTN, DM2, prostate cancer and multiple myeloma having achieved remission. Recent lab work is concerning for recurrence.    Interventional Radiology has been asked to evaluate this patient for an image-guided bone marrow biopsy with aspiration.   Past Medical History:  Diagnosis Date   Anemia    Arthritis    shoulders,feet    Cataract    per eye dr -has appt 5-11   CKD (chronic kidney disease), stage III (HCC)    Elevated PSA    History of ketoacidosis    03-29-2015    History of sepsis    07-25-2013  non-compliant w/ medication   Hyperlipidemia    Hypertension    Nocturia    Peripheral neuropathy    Prostate cancer ([place])    Type 2 diabetes mellitus with insulin therapy [facility])     Past Surgical History:  Procedure Laterality Date   CIRCUMCISION  1990's   PROSTATE BIOPSY N/A 12/21/2015   Procedure: BIOPSY TRANSRECTAL ULTRASONIC PROSTATE (TUBP);  Surgeon: Festus Aloe, MD;  Location: [facility];  Service: Urology;  Laterality: N/A;    Allergies: Other  Medications: Prior to Admission medications   Medication Sig Start Date End Date Taking? Authorizing Provider  acyclovir (ZOVIRAX) 400 MG tablet TAKE 1 TABLET (400 MG TOTAL) BY MOUTH 2 (TWO) TIMES DAILY. 01/31/21 01/31/22  [name], MD  amLODipine (NORVASC) 10 MG tablet TAKE 1 TABLET (10 MG TOTAL) BY MOUTH DAILY. 05/10/21 05/10/22  [name], MD  aspirin EC 81 MG tablet Take 1 tablet (81 mg total) by mouth daily. 10/02/16   Maren Reamer, MD  b complex vitamins capsule Take 1 capsule by mouth daily. 07/16/18   Brunetta Genera, MD   Blood Glucose Monitoring Suppl [facility] VERIO) w/Device KIT Use to test blood sugar TID. E11.2 12/23/19   [name], MD  buPROPion [facility] SR) 150 MG 12 hr tablet  08/22/19   [provider]  Continuous Blood Gluc Sensor (FREESTYLE LIBRE SENSOR SYSTEM) MISC Change sensor Q 2 wks 01/04/21   [name], MD  diclofenac Sodium (VOLTAREN) 1 % GEL Apply 4 g topically 4 (four) times daily. 06/13/19   Lovorn, Jinny Blossom, MD  donepezil (ARICEPT) 5 MG tablet TAKE 1 TABLET (5 MG TOTAL) BY MOUTH AT BEDTIME. FOR MEMORY ISSUES 01/04/21 01/04/22  [name], MD  ferrous sulfate 325 (65 FE) MG tablet Take 325 mg by mouth daily.    [provider]  gabapentin (NEURONTIN) 300 MG capsule TAKE 1 CAPSULE BY MOUTH IN THE MORNING AND LUNCH, AND 2 AT BEDTIME 10/19/20 10/19/21  [name], MD  glucose blood [facility] VERIO) test strip USE TO TEST BLOOD SUGAR THREE TIMES DAILY 05/23/21 05/23/22  [name], MD  hydrOXYzine (ATARAX/VISTARIL) 25 MG tablet TAKE 1 TABLET (25 MG TOTAL) BY MOUTH 3 (THREE) TIMES DAILY AS NEEDED FOR ITCHING (RASH). 06/06/21   Brunetta Genera, MD  ibuprofen (ADVIL) 800 MG tablet TAKE 1 TABLET BY MOUTH 3 TIMES DAILY 06/14/20 06/14/21  Lovena Neighbours, DMD  Insulin Lispro Prot & Lispro (HUMALOG MIX 75/25 KWIKPEN) (75-25) 100 UNIT/ML Kwikpen Inject 6 Units into the skin  in the morning and at bedtime. 04/07/21   [name], MD  Insulin Pen Needle 32G X 4 MM MISC USE AS DIRECTED TWICE DAILY WITH INSULIN 06/30/20 06/30/21  [name], MD  ixazomib citrate (NINLARO) 3 MG capsule Take 1 capsule (3 mg) by mouth weekly, 3 weeks on, 1 week off, repeat every 4 weeks. Take on an empty stomach 1hr before or 2hr after meals. 06/24/19   Brunetta Genera, MD  lidocaine (LIDODERM) 5 %  09/16/19   [provider]  lidocaine (LIDODERM) 5 % PLACE 1 PATCH ONTO SKIN. REMOVE AFTER 12 HOURS EXTERNAL ONCE A DAY 30 DAY(S) 08/31/20 08/31/21     Miconazole Nitrate (ATHLETES FOOT POWDER SPRAY) 2 % AERP Spray between toes and under toes once daily. 08/20/20   Marzetta Board, DPM  Multiple Vitamin (MULTIVITAMIN WITH MINERALS) TABS tablet Take 1 tablet by mouth daily. 11/12/16   Lorella Nimrod, MD  OneTouch Delica Lancets 38B MISC Use to test blood sugar TID. E11.2 12/23/19   [name], MD  pravastatin (PRAVACHOL) 20 MG tablet TAKE 1 TABLET (20 MG TOTAL) BY MOUTH DAILY. 05/23/21 05/23/22  [name], MD  tadalafil (CIALIS) 20 MG tablet Take 20 mg by mouth as needed. 12/10/19   [provider]  tiZANidine (ZANAFLEX) 2 MG tablet Take 1 tablet (2 mg total) by mouth at bedtime as needed for muscle spasms. 04/01/21   [name], MD  vitamin B-12 (CYANOCOBALAMIN) 100 MCG tablet Take 100 mcg by mouth daily.    [provider]  Vitamins/Minerals TABS Take by mouth. 11/12/16   [provider]     Family History  Problem Relation Age of Onset   Kidney disease Mother    Cancer Neg Hx    Colon cancer Neg Hx    Colon polyps Neg Hx    Esophageal cancer Neg Hx    Rectal cancer Neg Hx    Stomach cancer Neg Hx     Social History   Socioeconomic History   Marital status: Single    Spouse name: Not on file   Number of children: Not on file   Years of education: Not on file   Highest education level: Not on file  Occupational History   Not on file  Tobacco Use   Smoking status: Every Day    Packs/day: 0.50    Years: 50.00    Pack years: 25.00    Types: Cigarettes   Smokeless tobacco: Never   Tobacco comments:    1 ppwk-4-5 a day   Vaping Use   Vaping Use: Never used  Substance and Sexual Activity   Alcohol use: Yes    Alcohol/week: 1.0 standard drink    Types: 1 Cans of beer per week    Comment: 1 every day-quit couple weeks ago   Drug use: No   Sexual activity: Not Currently  Other Topics Concern   Not on file  Social History Narrative   Not on file   Social Determinants of  Health   Financial Resource Strain: Not on file  Food Insecurity: Not on file  Transportation Needs: Not on file  Physical Activity: Not on file  Stress: Not on file  Social Connections: Not on file    Review of Systems: A 12 point ROS discussed and pertinent positives are indicated in the HPI above.  All other systems are negative.  Review of Systems  Constitutional:  Negative for appetite change and fatigue.  Respiratory:  Negative for cough and shortness of breath.   Cardiovascular:  Negative for chest pain and leg swelling.  Gastrointestinal:  Negative for diarrhea, nausea and vomiting.  Neurological:  Negative for dizziness and headaches.   Vital Signs: BP 137/83   Pulse 76   Temp 98.4 F (36.9 C) (Oral)   Resp 16   SpO2 99%   Physical Exam Constitutional:      General: He is not in acute distress.    Appearance: He is not ill-appearing.  HENT:     Mouth/Throat:     Mouth: Mucous membranes are moist.     Pharynx: Oropharynx is clear.  Cardiovascular:     Rate and Rhythm: Normal rate and regular rhythm.     Pulses: Normal pulses.     Heart sounds: Normal heart sounds.  Pulmonary:     Effort: Pulmonary effort is normal.     Breath sounds: Normal breath sounds.  Abdominal:     General: Bowel sounds are normal.     Palpations: Abdomen is soft.     Tenderness: There is no abdominal tenderness.  Musculoskeletal:     Right lower leg: No edema.     Left lower leg: No edema.  Skin:    General: Skin is warm and dry.  Neurological:     Mental Status: He is alert and oriented to person, place, and time.    Imaging: NM PET Image Restage (PS) Whole Body  Result Date: 05/21/2021 CLINICAL DATA:  Subsequent treatment strategy for multiple myeloma. EXAM: NUCLEAR MEDICINE PET WHOLE BODY TECHNIQUE: 8.2 mCi F-18 FDG was injected intravenously. Full-ring PET imaging was performed from the head to foot after the radiotracer. CT data was obtained and used for attenuation  correction and anatomic localization. Fasting blood glucose: 157 mg/dl COMPARISON:  03/09/2017. FINDINGS: Mediastinal blood pool activity: SUV max 3.1 HEAD/NECK: No abnormal hypermetabolism. Incidental CT findings: Mucosal thickening in the sphenoid sinuses. CHEST: No hypermetabolic mediastinal, hilar or axillary lymph nodes. No hypermetabolic pulmonary nodules. Incidental CT findings: Minimal atherosclerotic calcification of the aorta and aortic valve. Heart size normal. No pericardial or pleural effusion. Image quality in the lungs is degraded by respiratory motion and expiratory phase imaging. ABDOMEN/PELVIS: No abnormal hypermetabolism in the liver, adrenal glands, spleen or pancreas. No hypermetabolic lymph nodes. Incidental CT findings: Liver is unremarkable. Mild intrahepatic biliary ductal dilatation. Parenchymal and possible ductal pancreatic calcifications. Pancreatic duct is minimally dilated, 4 mm. Spleen and right adrenal gland are unremarkable. There may be a 9 mm low-density nodule in the left adrenal gland. Kidneys, stomach and bowel are otherwise grossly unremarkable. Atherosclerotic calcification of the aorta. Bladder wall thickening. SKELETON: No abnormal hypermetabolism. Incidental CT findings: Degenerative changes in the spine. EXTREMITIES: No abnormal hypermetabolism. Incidental CT findings: None. IMPRESSION: 1. No abnormal hypermetabolism in the head/neck, chest, abdomen or pelvis. 2. Calcifications in the pancreas and likely within the duct, with slight ductal dilatation. Findings may be due to chronic calcific pancreatitis. CT abdomen without and with contrast (pancreatic protocol) could be performed in further evaluation, as clinically indicated as malignancy cannot be definitively excluded. 3. Probable small left adrenal adenoma. 4. Bladder wall thickening. 5.  Aortic atherosclerosis (ICD10-I70.0). Electronically Signed   By: Lorin Picket M.D.   On: 05/21/2021 08:05     Labs:  CBC: Recent Labs    11/16/20 0857 03/04/21 1037 04/26/21 1032 06/13/21 0826  WBC 5.9 6.4 5.9 6.1  HGB 13.6 12.6* 12.2* 12.6*  HCT 39.5 35.4* 34.9* 36.4*  PLT 200 197 169 192    COAGS: No results for input(s): INR, APTT in the last 8760 hours.  BMP: Recent Labs    11/16/20 0857 03/04/21 1037 04/26/21 1032 06/13/21 0826  NA 139 138 139 143  K 3.9 3.8 3.6 3.7  CL 103 106 104 107  CO2 '25 23 23 25  ' GLUCOSE 192* 201* 228* 147*  BUN '17 17 14 15  ' CALCIUM 9.7 9.4 9.5 9.4  CREATININE 1.51* 1.50* 1.51* 1.48*  GFRNONAA 49* 49* 48* 50*    LIVER FUNCTION TESTS: Recent Labs    11/16/20 0857 03/04/21 1037 04/26/21 1032 06/13/21 0826  BILITOT 0.9 0.8 1.1 0.9  AST '30 22 22 31  ' ALT 32 26 22 35  ALKPHOS 71 101 69 71  PROT 7.7 7.0 7.4 7.7  ALBUMIN 4.3 3.6 3.9 4.1    TUMOR MARKERS: No results for input(s): AFPTM, CEA, CA199, CHROMGRNA in the last 8760 hours.  Assessment and Plan:  History of multiple myeloma; concern for recurrence: Robert Sparks, 75 year old male, presents today to the [place] [facility] department for an image-guided bone marrow biopsy with aspiration.  Risks and benefits of bone marrow biopsy with aspiration were discussed with the patient and/or patient's family including, but not limited to bleeding, infection, damage to adjacent structures or low yield requiring additional tests.  All of the questions were answered and there is agreement to proceed. He has been NPO.   Consent signed and in chart.  Thank you for this interesting consult.  I greatly enjoyed meeting Robert Sparks and look forward to participating in their care.  A copy of this report was sent to the requesting provider on this date.  Electronically Signed: Soyla Dryer, AGACNP-BC [phone] 06/13/2021, 10:17 AM   I spent a total of  30 Minutes   in face to face in clinical consultation, greater than 50% of which was counseling/coordinating  care for bone marrow biopsy with aspiration.

## 2021-06-13 ENCOUNTER — Inpatient Hospital Stay: Payer: Medicare Other | Attending: Hematology

## 2021-06-13 ENCOUNTER — Other Ambulatory Visit: Payer: Self-pay

## 2021-06-13 ENCOUNTER — Ambulatory Visit (HOSPITAL_COMMUNITY)
Admission: RE | Admit: 2021-06-13 | Discharge: 2021-06-13 | Disposition: A | Discharge status: Home / Self Care | Payer: Medicare Other | Source: Ambulatory Visit | Attending: Hematology | Admitting: Hematology

## 2021-06-13 ENCOUNTER — Encounter (HOSPITAL_COMMUNITY): Payer: Self-pay

## 2021-06-13 DIAGNOSIS — D649 Anemia, unspecified: Secondary | ICD-10-CM | POA: Diagnosis not present

## 2021-06-13 DIAGNOSIS — E118 Type 2 diabetes mellitus with unspecified complications: Secondary | ICD-10-CM | POA: Insufficient documentation

## 2021-06-13 DIAGNOSIS — D721 Eosinophilia, unspecified: Secondary | ICD-10-CM | POA: Diagnosis not present

## 2021-06-13 DIAGNOSIS — N189 Chronic kidney disease, unspecified: Secondary | ICD-10-CM | POA: Insufficient documentation

## 2021-06-13 DIAGNOSIS — Z79899 Other long term (current) drug therapy: Secondary | ICD-10-CM | POA: Insufficient documentation

## 2021-06-13 DIAGNOSIS — C9002 Multiple myeloma in relapse: Secondary | ICD-10-CM

## 2021-06-13 DIAGNOSIS — I129 Hypertensive chronic kidney disease with stage 1 through stage 4 chronic kidney disease, or unspecified chronic kidney disease: Secondary | ICD-10-CM | POA: Insufficient documentation

## 2021-06-13 DIAGNOSIS — C9000 Multiple myeloma not having achieved remission: Secondary | ICD-10-CM | POA: Diagnosis not present

## 2021-06-13 DIAGNOSIS — E1140 Type 2 diabetes mellitus with diabetic neuropathy, unspecified: Secondary | ICD-10-CM | POA: Insufficient documentation

## 2021-06-13 LAB — CMP (CANCER CENTER ONLY)
ALT: 35 U/L (ref 0–44)
AST: 31 U/L (ref 15–41)
Albumin: 4.1 g/dL (ref 3.5–5.0)
Alkaline Phosphatase: 71 U/L (ref 38–126)
Anion gap: 11 (ref 5–15)
BUN: 15 mg/dL (ref 8–23)
CO2: 25 mmol/L (ref 22–32)
Calcium: 9.4 mg/dL (ref 8.9–10.3)
Chloride: 107 mmol/L (ref 98–111)
Creatinine: 1.48 mg/dL — ABNORMAL HIGH (ref 0.61–1.24)
GFR, Estimated: 50 mL/min — ABNORMAL LOW (ref >60)
Glucose, Bld: 147 mg/dL — ABNORMAL HIGH (ref 70–99)
Potassium: 3.7 mmol/L (ref 3.5–5.1)
Sodium: 143 mmol/L (ref 135–145)
Total Bilirubin: 0.9 mg/dL (ref 0.3–1.2)
Total Protein: 7.7 g/dL (ref 6.5–8.1)

## 2021-06-13 LAB — CBC WITH DIFFERENTIAL/PLATELET
Abs Immature Granulocytes: 0.01 10*3/uL (ref 0.00–0.07)
Basophils Absolute: 0 10*3/uL (ref 0.0–0.1)
Basophils Relative: 1 %
Eosinophils Absolute: 0.9 10*3/uL — ABNORMAL HIGH (ref 0.0–0.5)
Eosinophils Relative: 14 %
HCT: 36.4 % — ABNORMAL LOW (ref 39.0–52.0)
Hemoglobin: 12.6 g/dL — ABNORMAL LOW (ref 13.0–17.0)
Immature Granulocytes: 0 %
Lymphocytes Relative: 20 %
Lymphs Abs: 1.2 10*3/uL (ref 0.7–4.0)
MCH: 34.3 pg — ABNORMAL HIGH (ref 26.0–34.0)
MCHC: 34.6 g/dL (ref 30.0–36.0)
MCV: 99.2 fL (ref 80.0–100.0)
Monocytes Absolute: 0.4 10*3/uL (ref 0.1–1.0)
Monocytes Relative: 7 %
Neutro Abs: 3.6 10*3/uL (ref 1.7–7.7)
Neutrophils Relative %: 58 %
Platelets: 192 10*3/uL (ref 150–400)
RBC: 3.67 MIL/uL — ABNORMAL LOW (ref 4.22–5.81)
RDW: 12.8 % (ref 11.5–15.5)
WBC: 6.1 10*3/uL (ref 4.0–10.5)
nRBC: 0 % (ref 0.0–0.2)

## 2021-06-13 LAB — GLUCOSE, CAPILLARY: Glucose-Capillary: 134 mg/dL — ABNORMAL HIGH (ref 70–99)

## 2021-06-13 MED ORDER — FENTANYL CITRATE (PF) 100 MCG/2ML IJ SOLN
INTRAMUSCULAR | Status: AC | PRN
  Administered 2021-06-13 (×2): 50 ug via INTRAVENOUS

## 2021-06-13 MED ORDER — FLUMAZENIL 0.5 MG/5ML IV SOLN
INTRAVENOUS | Status: AC
  Filled 2021-06-13: qty 5

## 2021-06-13 MED ORDER — LIDOCAINE HCL (PF) 1 % IJ SOLN
INTRAMUSCULAR | Status: AC | PRN
  Administered 2021-06-13: 10 mL via SUBCUTANEOUS

## 2021-06-13 MED ORDER — MIDAZOLAM HCL 2 MG/2ML IJ SOLN
INTRAMUSCULAR | Status: AC | PRN
  Administered 2021-06-13: .5 mg via INTRAVENOUS
  Administered 2021-06-13 (×2): 1 mg via INTRAVENOUS

## 2021-06-13 MED ORDER — FENTANYL CITRATE (PF) 100 MCG/2ML IJ SOLN
INTRAMUSCULAR | Status: AC
  Filled 2021-06-13: qty 4

## 2021-06-13 MED ORDER — SODIUM CHLORIDE 0.9 % IV SOLN: INTRAVENOUS | Status: DC

## 2021-06-13 MED ORDER — MIDAZOLAM HCL 2 MG/2ML IJ SOLN
INTRAMUSCULAR | Status: AC
  Filled 2021-06-13: qty 4

## 2021-06-13 MED ORDER — NALOXONE HCL 0.4 MG/ML IJ SOLN
INTRAMUSCULAR | Status: AC
  Filled 2021-06-13: qty 1

## 2021-06-13 NOTE — Sedation Documentation (Signed)
Bandaid at biopsy site.

## 2021-06-13 NOTE — Procedures (Signed)
Interventional Radiology Procedure:   Indications: Multiple myeloma, evaluate for progression  Procedure: CT guided bone marrow biopsy  Findings: 2 aspirates and 1 core from right ilium  Complications: None     EBL: Minimal, less than 10 ml  Plan: Discharge to home in one hour.   Soumya Colson R. Anselm Pancoast, MD  Pager: [phone]

## 2021-06-13 NOTE — Discharge Instructions (Addendum)
Bone Marrow Aspiration and Bone Marrow Biopsy, Adult, Care After This sheet gives you information about how to care for yourself after your procedure. Your health care provider may also give you more specific instructions. If you have problems or questions, contact your health care provider. May remove dressing or bandaid and shower tomorrow.  Keep site clean and dry. Replace with clean dressing or bandaid as necessary.  What can I expect after the procedure? After the procedure, it is common to have: Mild pain and tenderness. Swelling. Bruising. Follow these instructions at home: Puncture site care Follow instructions from your health care provider about how to take care of the puncture site. Make sure you: Wash your hands with soap and water before and after you change your bandage (dressing). If soap and water are not available, use hand sanitizer. Change your dressing as told by your health care provider. Check your puncture site every day for signs of infection. Check for: More redness, swelling, or pain. Fluid or blood. Warmth. Pus or a bad smell. Activity Return to your normal activities as told by your health care provider. Ask your health care provider what activities are safe for you. Do not lift anything that is heavier than 10 lb (4.5 kg), or the limit that you are told, until your health care provider says that it is safe. Do not drive for 24 hours if you were given a sedative during your procedure. General instructions Take over-the-counter and prescription medicines only as told by your health care provider. Do not take baths, swim, or use a hot tub until your health care provider approves. Ask your health care provider if you may take showers. You may only be allowed to take sponge baths. If directed, put ice on the affected area. To do this: Put ice in a plastic bag. Place a towel between your skin and the bag. Leave the ice on for 20 minutes, 2-3 times a day. Keep all  follow-up visits as told by your health care provider. This is important. Contact a health care provider if: Your pain is not controlled with medicine. You have a fever. You have more redness, swelling, or pain around the puncture site. You have fluid or blood coming from the puncture site. Your puncture site feels warm to the touch. You have pus or a bad smell coming from the puncture site. Summary After the procedure, it is common to have mild pain, tenderness, swelling, and bruising. Follow instructions from your health care provider about how to take care of the puncture site and what activities are safe for you. Take over-the-counter and prescription medicines only as told by your health care provider. Contact a health care provider if you have any signs of infection, such as fluid or blood coming from the puncture site. This information is not intended to replace advice given to you by your health care provider. Make sure you discuss any questions you have with your health care provider. Document Revised: 11/12/2018 Document Reviewed: 11/12/2018 Elsevier Patient Education  [place].     Moderate Conscious Sedation, Adult, Care After This sheet gives you information about how to care for yourself after your procedure. Your health care provider may also give you more specific instructions. If you have problems or questions, contact your health care provider. What can I expect after the procedure? After the procedure, it is common to have: Sleepiness for several hours. Impaired judgment for several hours. Difficulty with balance. Vomiting if you eat too soon.  Follow these instructions at home: For the time period you were told by your health care provider:    Rest. Do not participate in activities where you could fall or become injured. Do not drive or use machinery. Do not drink alcohol. Do not take sleeping pills or medicines that cause drowsiness. Do not make  important decisions or sign legal documents. Do not take care of children on your own. Eating and drinking Follow the diet recommended by your health care provider. Drink enough fluid to keep your urine pale yellow. If you vomit: Drink water, juice, or soup when you can drink without vomiting. Make sure you have little or no nausea before eating solid foods. General instructions Take over-the-counter and prescription medicines only as told by your health care provider. Have a responsible adult stay with you for the time you are told. It is important to have someone help care for you until you are awake and alert. Do not smoke. Keep all follow-up visits as told by your health care provider. This is important. Contact a health care provider if: You are still sleepy or having trouble with balance after 24 hours. You feel light-headed. You keep feeling nauseous or you keep vomiting. You develop a rash. You have a fever. You have redness or swelling around the IV site. Get help right away if: You have trouble breathing. You have new-onset confusion at home. Summary After the procedure, it is common to feel sleepy, have impaired judgment, or feel nauseous if you eat too soon. Rest after you get home. Know the things you should not do after the procedure. Follow the diet recommended by your health care provider and drink enough fluid to keep your urine pale yellow. Get help right away if you have trouble breathing or new-onset confusion at home. This information is not intended to replace advice given to you by your health care provider. Make sure you discuss any questions you have with your health care provider. Document Revised: 10/24/2019 Document Reviewed: 05/22/2019 Elsevier Patient Education  2022 [employer].

## 2021-06-14 ENCOUNTER — Other Ambulatory Visit: Payer: Self-pay

## 2021-06-14 DIAGNOSIS — N183 Chronic kidney disease, stage 3 unspecified: Secondary | ICD-10-CM | POA: Diagnosis not present

## 2021-06-14 LAB — MISC LABCORP TEST (SEND OUT): Labcorp test code: 123540

## 2021-06-14 LAB — KAPPA/LAMBDA LIGHT CHAINS
Kappa free light chain: 46.9 mg/L — ABNORMAL HIGH (ref 3.3–19.4)
Kappa, lambda light chain ratio: 0.18 — ABNORMAL LOW (ref 0.26–1.65)
Lambda free light chains: 263.3 mg/L — ABNORMAL HIGH (ref 5.7–26.3)

## 2021-06-15 LAB — SURGICAL PATHOLOGY

## 2021-06-16 ENCOUNTER — Other Ambulatory Visit: Payer: Self-pay

## 2021-06-16 LAB — MULTIPLE MYELOMA PANEL, SERUM
Albumin SerPl Elph-Mcnc: 3.9 g/dL (ref 2.9–4.4)
Albumin/Glob SerPl: 1.3 (ref 0.7–1.7)
Alpha 1: 0.2 g/dL (ref 0.0–0.4)
Alpha2 Glob SerPl Elph-Mcnc: 0.9 g/dL (ref 0.4–1.0)
B-Globulin SerPl Elph-Mcnc: 1.3 g/dL (ref 0.7–1.3)
Gamma Glob SerPl Elph-Mcnc: 0.9 g/dL (ref 0.4–1.8)
Globulin, Total: 3.2 g/dL (ref 2.2–3.9)
IgA: 759 mg/dL — ABNORMAL HIGH (ref 61–437)
IgG (Immunoglobin G), Serum: 875 mg/dL (ref 603–1613)
IgM (Immunoglobulin M), Srm: 82 mg/dL (ref 15–143)
M Protein SerPl Elph-Mcnc: 0.7 g/dL — ABNORMAL HIGH
Total Protein ELP: 7.1 g/dL (ref 6.0–8.5)

## 2021-06-17 ENCOUNTER — Inpatient Hospital Stay (HOSPITAL_BASED_OUTPATIENT_CLINIC_OR_DEPARTMENT_OTHER): Payer: Medicare Other | Admitting: Hematology

## 2021-06-17 ENCOUNTER — Other Ambulatory Visit: Payer: Self-pay

## 2021-06-17 VITALS — BP 132/76 | HR 84 | Temp 97.5°F | Resp 18 | Wt 164.8 lb

## 2021-06-17 DIAGNOSIS — E1140 Type 2 diabetes mellitus with diabetic neuropathy, unspecified: Secondary | ICD-10-CM | POA: Diagnosis not present

## 2021-06-17 DIAGNOSIS — Z7189 Other specified counseling: Secondary | ICD-10-CM | POA: Diagnosis not present

## 2021-06-17 DIAGNOSIS — C9002 Multiple myeloma in relapse: Secondary | ICD-10-CM | POA: Diagnosis not present

## 2021-06-17 DIAGNOSIS — I129 Hypertensive chronic kidney disease with stage 1 through stage 4 chronic kidney disease, or unspecified chronic kidney disease: Secondary | ICD-10-CM | POA: Diagnosis not present

## 2021-06-17 DIAGNOSIS — N189 Chronic kidney disease, unspecified: Secondary | ICD-10-CM | POA: Diagnosis not present

## 2021-06-20 ENCOUNTER — Telehealth: Payer: Self-pay | Admitting: Hematology

## 2021-06-20 ENCOUNTER — Other Ambulatory Visit: Payer: Self-pay

## 2021-06-20 NOTE — Telephone Encounter (Signed)
Scheduled follow-up appointments per 12/9 los. Patient is aware. Mailed calendar.

## 2021-06-22 ENCOUNTER — Encounter (HOSPITAL_COMMUNITY): Payer: Self-pay | Admitting: Hematology

## 2021-06-23 ENCOUNTER — Encounter (HOSPITAL_COMMUNITY): Payer: Self-pay | Admitting: Hematology

## 2021-06-23 DIAGNOSIS — E1122 Type 2 diabetes mellitus with diabetic chronic kidney disease: Secondary | ICD-10-CM | POA: Diagnosis not present

## 2021-06-23 DIAGNOSIS — N182 Chronic kidney disease, stage 2 (mild): Secondary | ICD-10-CM | POA: Diagnosis not present

## 2021-06-23 DIAGNOSIS — I10 Essential (primary) hypertension: Secondary | ICD-10-CM | POA: Diagnosis not present

## 2021-06-23 DIAGNOSIS — D631 Anemia in chronic kidney disease: Secondary | ICD-10-CM | POA: Diagnosis not present

## 2021-06-23 DIAGNOSIS — N2581 Secondary hyperparathyroidism of renal origin: Secondary | ICD-10-CM | POA: Diagnosis not present

## 2021-06-23 NOTE — Progress Notes (Addendum)
HEMATOLOGY/ONCOLOGY CLINIC NOTE  Date of Service: .06/17/2021   Patient Care Team: [name], MD as PCP - General (Internal Medicine) [name], MD (Inactive) as Consulting Physician (Hematology and Oncology)  CHIEF COMPLAINTS/PURPOSE OF CONSULTATION:  Follow-up for continued management of multiple myeloma which is now relapsed. Discussion of bone marrow biopsy and PET CT scan results and treatment planning for relapsed myeloma  Oncologic History:  75 y.o. male with diagnosis of IgA lambda active multiple myeloma, ISS Stage I. Active disease diagnosed based on presence of anemia, kidney insufficiency, and paraproteinemia with significant predominance of lambda light chains as well as significant elevation of IgA. Bone marrow biopsy confirmed presence of atypical monoclonal plasma cell process in the bone marrow comprising at least 7% of the cellularity. Based on the findings, patient was started on treatment with lenalidomide, bortezomib, and low-dose dexamethasone based on the anticipated tolerance by the patient. Lenalidomide was started at 10 mg daily based on creatinine clearance of 42, dexamethasone dose was reduced to 20 mg weekly based on patient's age. Treatment was complicated by rapid development of cutaneous rash attributed to lenalidomide, inaddition to decreased renal function and now patient has persistent severe anemia. Side-effects were attributed to Lenalidomide and lenalidomide was discontinued with subsequent recovery.  Patient has been receiving bortezomib weekly with low-dose dexamethasone in 4-day cycles.   Patient has completed induction systemic therapy for his disease with repeat bone marrow biopsy confirming complete response including no evidence of minimal residual disease by cytogenetics or FISH.  HISTORY OF PRESENTING ILLNESS:   Robert Sparks is a wonderful 75 y.o. male who has been referred to [place] by my colleague Dr Grace Isaac for  evaluation and management of Multiple Myeloma. The pt reports that he is doing well overall.   The pt has been previously followed by my colleague Dr Grace Isaac. The pt was treated with Revlimid, Velcade, and dexamethasone and was subsequently switched to Velcade and Dexamethasone after a Revlimid intolerance.   The pt reports some knee aches and skin soreness in his legs which is worse at night. He notes that he has had diabetic-related peripheral neuropathy. The pt has not seen [facility] yet for a discussion of a BM transplant. He continues taking Acyclovir and notes no difficulty taking maintenance Velcade.   He adds that he takes Gabapentin and sweats.  He notes that his neuropathy is currently stable and denies it worsening.   He notes that a pack of cigarettes lasts him for about 3 days.   Most recent lab results (12/14/17) of CBC and CMP  is as follows: all values are WNL except for RBC at 3.08, HGB at 10.1, HCT at 31.0, MCV at 100.6, Eosinophils Abs at 1.4k, Potassium at 3.4, Glucose at 156, Creatinine at 1.60. LDH 12/14/17 is 141 Beta 2 microglobulin 12/14/17 is 2.4  On review of systems, pt reports knee pain, night time LE skin soreness, peripheral neuropathy, eating well, strong appetite, intermittent sweating and denies fevers, chills, back pains, abdominal pains, and any other symptoms.   [name] DM type 2, HTN, CKD. On Social Hx the pt reports smoking a pack of cigarettes every 3 days.   [name] is here for follow-up of his relapsed multiple myeloma and for discussion of his PET CT scan, bone marrow biopsy and treatment planning for relapsed multiple myeloma. His last visit with [place] was on 05/24/2021.  Patient notes no acute  new concerns.  No localizable bone pains.  Stable but persistent chronic neuropathy.  His PET CT scan that was done on 05/20/2021 showed no evidence of hypermetabolic bone mets or extraosseous evidence of  multiple myeloma.  His labs from 06/13/2021 show hemoglobin of 12.6, WBC count of 6.1k platelets of 192k CMP stable chronic kidney disease with a creatinine of 1.48 Myeloma panel shows an M spike of 0.7 grams per deciliter IFE shows IgA monoclonal protein with lambda light chain specificity. Increase lambda free light chains to 263.  Bone marrow biopsy done on 06/13/2021 shows hypercellular bone marrow for age with plasma cell neoplasm.  Increased plasma cells represent 8% of all the cells in the aspirate.  The plasma cells display lambda light chain restriction consistent with plasma cell neoplasm. Myeloma FISH panel on the bone marrow showed duplication of 1 q. and detection of atypical t (4:14) both of which are high risk mutations.  I discussed the bone marrow findings and labs in details with the patient and the fact that this suggest that his multiple myeloma has relapsed.  We discussed different treatment options and he is agreeable with proceeding with next line treatment with daratumumab and dexamethasone.    MEDICAL HISTORY:  Past Medical History:  Diagnosis Date   Anemia    Arthritis    shoulders,feet    Cataract    per eye dr -has appt 5-11   CKD (chronic kidney disease), stage III (HCC)    Elevated PSA    History of ketoacidosis    03-29-2015    History of sepsis    07-25-2013  non-compliant w/ medication   Hyperlipidemia    Hypertension    Nocturia    Peripheral neuropathy    Prostate cancer ([place])    Type 2 diabetes mellitus with insulin therapy [facility])     SURGICAL HISTORY: Past Surgical History:  Procedure Laterality Date   CIRCUMCISION  1990's   PROSTATE BIOPSY N/A 12/21/2015   Procedure: BIOPSY TRANSRECTAL ULTRASONIC PROSTATE (TUBP);  Surgeon: Festus Aloe, MD;  [facility]  Service: Urology;  Laterality: N/A;    SOCIAL HISTORY: Social History   Socioeconomic History   Marital status: Single    Spouse name: Not on file    Number of children: Not on file   Years of education: Not on file   Highest education level: Not on file  Occupational History   Not on file  Tobacco Use   Smoking status: Every Day    Packs/day: 0.50    Years: 50.00    Pack years: 25.00    Types: Cigarettes   Smokeless tobacco: Never   Tobacco comments:    1 ppwk-4-5 a day   Vaping Use   Vaping Use: Never used  Substance and Sexual Activity   Alcohol use: Yes    Alcohol/week: 1.0 standard drink    Types: 1 Cans of beer per week    Comment: 1 every day-quit couple weeks ago   Drug use: No   Sexual activity: Not Currently  Other Topics Concern   Not on file  Social History Narrative   Not on file   Social Determinants of Health   Financial Resource Strain: Not on file  Food Insecurity: Not on file  Transportation Needs: Not on file  Physical Activity: Not on file  Stress: Not on file  Social Connections: Not on file  Intimate Partner Violence: Not on file    FAMILY HISTORY: Family History  Problem  Relation Age of Onset   Kidney disease Mother    Cancer Neg Hx    Colon cancer Neg Hx    Colon polyps Neg Hx    Esophageal cancer Neg Hx    Rectal cancer Neg Hx    Stomach cancer Neg Hx     ALLERGIES:  is allergic to other.  MEDICATIONS:  Current Outpatient Medications  Medication Sig Dispense Refill   acyclovir (ZOVIRAX) 400 MG tablet TAKE 1 TABLET (400 MG TOTAL) BY MOUTH 2 (TWO) TIMES DAILY. 60 tablet 5   amLODipine (NORVASC) 10 MG tablet TAKE 1 TABLET (10 MG TOTAL) BY MOUTH DAILY. 30 tablet 1   aspirin EC 81 MG tablet Take 1 tablet (81 mg total) by mouth daily. 90 tablet 3   b complex vitamins capsule Take 1 capsule by mouth daily. 30 capsule 5   Blood Glucose Monitoring Suppl (ONETOUCH VERIO) w/Device KIT Use to test blood sugar TID. E11.2 1 kit 0   buPROPion (WELLBUTRIN SR) 150 MG 12 hr tablet      Continuous Blood Gluc Sensor (FREESTYLE LIBRE SENSOR SYSTEM) MISC Change sensor Q 2 wks 2 each 12   diclofenac  Sodium (VOLTAREN) 1 % GEL Apply 4 g topically 4 (four) times daily. 150 g 5   donepezil (ARICEPT) 5 MG tablet TAKE 1 TABLET (5 MG TOTAL) BY MOUTH AT BEDTIME. FOR MEMORY ISSUES 30 tablet 2   ferrous sulfate 325 (65 FE) MG tablet Take 325 mg by mouth daily.     gabapentin (NEURONTIN) 300 MG capsule TAKE 1 CAPSULE BY MOUTH IN THE MORNING AND LUNCH, AND 2 AT BEDTIME 120 capsule 6   glucose blood (ONETOUCH VERIO) test strip USE TO TEST BLOOD SUGAR THREE TIMES DAILY 100 strip 2   hydrOXYzine (ATARAX/VISTARIL) 25 MG tablet TAKE 1 TABLET (25 MG TOTAL) BY MOUTH 3 (THREE) TIMES DAILY AS NEEDED FOR ITCHING (RASH). 30 tablet 0   Insulin Lispro Prot & Lispro (HUMALOG MIX 75/25 KWIKPEN) (75-25) 100 UNIT/ML Kwikpen Inject 6 Units into the skin in the morning and at bedtime. 15 mL 2   Insulin Pen Needle 32G X 4 MM MISC USE AS DIRECTED TWICE DAILY WITH INSULIN 100 each 6   ixazomib citrate (NINLARO) 3 MG capsule Take 1 capsule (3 mg) by mouth weekly, 3 weeks on, 1 week off, repeat every 4 weeks. Take on an empty stomach 1hr before or 2hr after meals. 3 capsule 3   lidocaine (LIDODERM) 5 %      Miconazole Nitrate (ATHLETES FOOT POWDER SPRAY) 2 % AERP Spray between toes and under toes once daily. 130 g 4   Multiple Vitamin (MULTIVITAMIN WITH MINERALS) TABS tablet Take 1 tablet by mouth daily. 60 tablet 2   OneTouch Delica Lancets 63Z MISC Use to test blood sugar TID. E11.2 100 each 2   pravastatin (PRAVACHOL) 20 MG tablet TAKE 1 TABLET (20 MG TOTAL) BY MOUTH DAILY. 30 tablet 0   tadalafil (CIALIS) 20 MG tablet Take 20 mg by mouth as needed.     tiZANidine (ZANAFLEX) 2 MG tablet Take 1 tablet (2 mg total) by mouth at bedtime as needed for muscle spasms. 30 tablet 1   vitamin B-12 (CYANOCOBALAMIN) 100 MCG tablet Take 100 mcg by mouth daily.     Vitamins/Minerals TABS Take by mouth.     No current facility-administered medications for this visit.    .10 Point review of Systems was done is negative except as noted  above.  PHYSICAL EXAMINATION: ECOG PERFORMANCE  STATUS: 1 - Symptomatic but completely ambulatory Exam was given in a chair  .BP 132/76    Pulse 84    Temp (!) 97.5 F (36.4 C)    Resp 18    Wt 164 lb 12.8 oz (74.8 kg)    SpO2 100%    BMI 25.81 kg/m    GENERAL:alert, in no acute distress and comfortable SKIN: no acute rashes, no significant lesions EYES: conjunctiva are pink and non-injected, sclera anicteric OROPHARYNX: MMM, no exudates, no oropharyngeal erythema or ulceration NECK: supple, no JVD LYMPH:  no palpable lymphadenopathy in the cervical, axillary or inguinal regions LUNGS: clear to auscultation b/l with normal respiratory effort HEART: regular rate & rhythm ABDOMEN:  normoactive bowel sounds , non tender, not distended. Extremity: no pedal edema PSYCH: alert & oriented x 3 with fluent speech NEURO: no focal motor/sensory deficits    LABORATORY DATA:  I have reviewed the data as listed  . CBC Latest Ref Rng & Units 06/13/2021 04/26/2021 03/04/2021  WBC 4.0 - 10.5 K/uL 6.1 5.9 6.4  Hemoglobin 13.0 - 17.0 g/dL 12.6(L) 12.2(L) 12.6(L)  Hematocrit 39.0 - 52.0 % 36.4(L) 34.9(L) 35.4(L)  Platelets 150 - 400 K/uL 192 169 197    . CMP Latest Ref Rng & Units 06/13/2021 04/26/2021 03/04/2021  Glucose 70 - 99 mg/dL 147(H) 228(H) 201(H)  BUN 8 - 23 mg/dL '15 14 17  ' Creatinine 0.61 - 1.24 mg/dL 1.48(H) 1.51(H) 1.50(H)  Sodium 135 - 145 mmol/L 143 139 138  Potassium 3.5 - 5.1 mmol/L 3.7 3.6 3.8  Chloride 98 - 111 mmol/L 107 104 106  CO2 22 - 32 mmol/L '25 23 23  ' Calcium 8.9 - 10.3 mg/dL 9.4 9.5 9.4  Total Protein 6.5 - 8.1 g/dL 7.7 7.4 7.0  Total Bilirubin 0.3 - 1.2 mg/dL 0.9 1.1 0.8  Alkaline Phos 38 - 126 U/L 71 69 101  AST 15 - 41 U/L '31 22 22  ' ALT 0 - 44 U/L 35 22 26     08/29/17 BM Bx:    08/29/17 Cytogenetics:   03/13/17 Cytogenetics:         PATHOLOGY Surgical Pathology  CASE: [id number]  PATIENT: Robert Sparks  Bone Marrow Report       Clinical History: Multiple myeloma in relapse ([place]) ,(BH)      DIAGNOSIS:   BONE MARROW, ASPIRATE, CLOT, CORE:  -Hypercellular bone marrow for age with plasma cell neoplasm  -See comment   PERIPHERAL BLOOD:  -Mild normocytic-normochromic anemia  -Eosinophilia   COMMENT:   The bone marrow is hypercellular for age with increased number of plasma  cells representing 8% of all cells in the aspirate associated with  numerous small clusters in the clot and biopsy sections. The plasma  cells display lambda light chain restriction consistent with plasma cell  neoplasm.  The background shows trilineage hematopoiesis with generally  nonspecific myeloid changes, likely secondary in nature in this setting.  Correlation with cytogenetic and FISH studies is recommended.      RADIOGRAPHIC STUDIES: I have personally reviewed the radiological images as listed and agreed with the findings in the report. CT Biopsy  Result Date: 06/13/2021 INDICATION: 75 year old with history of multiple myeloma. Evaluate for disease progression. EXAM: CT GUIDED BONE MARROW ASPIRATES AND BIOPSY Physician: Stephan Minister. Anselm Pancoast, MD MEDICATIONS: None. ANESTHESIA/SEDATION: Moderate (conscious) sedation was employed during this procedure. A total of Versed 2.27m and fentanyl 100 mcg was administered intravenously at the order of the provider performing the procedure. Total intra-service moderate  sedation time: 11 minutes. Patient's level of consciousness and vital signs were monitored continuously by radiology nurse throughout the procedure under the supervision of the provider performing the procedure. COMPLICATIONS: None immediate. PROCEDURE: The procedure was explained to the patient. The risks and benefits of the procedure were discussed and the patient's questions were addressed. Informed consent was obtained from the patient. The patient was placed prone on CT table. Images of the pelvis were obtained. The right side  of back was prepped and draped in sterile fashion. The skin and right posterior ilium were anesthetized with 1% lidocaine. 11 gauge bone needle was directed into the right ilium with CT guidance. Two aspirates and one core biopsy were obtained. Bandage placed over the puncture site. IMPRESSION: CT guided bone marrow aspiration and core biopsy. Electronically Signed   By: Markus Daft M.D.   On: 06/13/2021 13:53   CT BONE MARROW BIOPSY & ASPIRATION  Result Date: 06/13/2021 INDICATION: 75 year old with history of multiple myeloma. Evaluate for disease progression. EXAM: CT GUIDED BONE MARROW ASPIRATES AND BIOPSY Physician: Stephan Minister. Anselm Pancoast, MD MEDICATIONS: None. ANESTHESIA/SEDATION: Moderate (conscious) sedation was employed during this procedure. A total of Versed 2.32m and fentanyl 100 mcg was administered intravenously at the order of the provider performing the procedure. Total intra-service moderate sedation time: 11 minutes. Patient's level of consciousness and vital signs were monitored continuously by radiology nurse throughout the procedure under the supervision of the provider performing the procedure. COMPLICATIONS: None immediate. PROCEDURE: The procedure was explained to the patient. The risks and benefits of the procedure were discussed and the patient's questions were addressed. Informed consent was obtained from the patient. The patient was placed prone on CT table. Images of the pelvis were obtained. The right side of back was prepped and draped in sterile fashion. The skin and right posterior ilium were anesthetized with 1% lidocaine. 11 gauge bone needle was directed into the right ilium with CT guidance. Two aspirates and one core biopsy were obtained. Bandage placed over the puncture site. IMPRESSION: CT guided bone marrow aspiration and core biopsy. Electronically Signed   By: AMarkus DaftM.D.   On: 06/13/2021 13:53    ASSESSMENT & PLAN:   75y.o. male with   1. IgA Lambda Multiple Myeloma ISS  Stage I    Active disease was previously diagnosed based on presence of anemia, kidney insufficiency, and paraproteinemia with significant predominance of lambda light chains as well as significant elevation of IgA. Patient is status post patient was treated with induction bortezomib Revlimid dexamethasone.  Had issues with skin rashes with Revlimid and this was discontinued.  Completed bortezomib dexamethasone induction and achieved complete remission. Was on maintenance Velcade 2 weeks which was then switched to maintenance Ninlaro. Patient had issues with worsening neuropathy which was a combination of Velcade and his diabetes.  Discontinued on Ninlaro and Ninlaro was discontinued as well after maintenance of more than 2 years.  2.  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 Bone marrow biopsy shows 8% lambda restricted plasma cells M spike progressively increasing to 0.7 g/dL PET CT scan with no hypermetabolic osseous lesions  3.  Hypertension 4.  Diabetes type 2 5 .chronic kidney disease due to hypertension and diabetes. 6.  Peripheral neuropathy related to diabetes and previous myeloma treatments. PLAN:  -Discussed his lab results from 06/13/2021  which show hemoglobin of 12.6, WBC count of 6.1k platelets of  192k CMP stable chronic kidney disease with a creatinine of 1.48 Myeloma panel shows an M spike of 0.7 grams per deciliter IFE shows IgA monoclonal protein with lambda light chain specificity. Increase lambda free light chains to 263.  Bone marrow biopsy done on 06/13/2021 shows hypercellular bone marrow for age with plasma cell neoplasm.  Increased plasma cells represent 8% of all the cells in the aspirate.  The plasma cells display lambda light chain restriction consistent with plasma cell neoplasm. Myeloma FISH panel on the bone marrow showed duplication of 1 q. and detection of atypical t  (4:14) both of which are high risk mutations.  I discussed the bone marrow findings and labs in details with the patient and the fact that this suggest that his multiple myeloma has relapsed.  We discussed different treatment options and he is agreeable with proceeding with next line treatment with daratumumab and dexamethasone. Will plan to add 3rd medication based on initial tolerance to treatment.  -Continue vitamin B complex and vitamin D  -He was recommended to follow-up with his primary care physician to have an optimal plan in place for management of his diabetes with the use of steroids.   FOLLOW UP: -Please schedule to start weekly daratumumab from 07/12/2021 with labs x 4 weeks -MD visit with cycle 1 day 8 for toxicity check  . The total time spent in the appointment was 35 minutes and more than 50% was on counseling and direct patient cares.   All of the patient's questions were answered with apparent satisfaction. The patient knows to call the clinic with any problems, questions or concerns.   Sullivan Lone MD [place] AAHIVMS [facility] Hematology/Oncology Physician

## 2021-06-24 ENCOUNTER — Telehealth: Payer: Self-pay | Admitting: Internal Medicine

## 2021-06-24 ENCOUNTER — Other Ambulatory Visit: Payer: Self-pay

## 2021-06-24 ENCOUNTER — Encounter: Payer: Self-pay | Admitting: Hematology

## 2021-06-24 DIAGNOSIS — C9002 Multiple myeloma in relapse: Secondary | ICD-10-CM | POA: Insufficient documentation

## 2021-06-24 DIAGNOSIS — Z7189 Other specified counseling: Secondary | ICD-10-CM | POA: Insufficient documentation

## 2021-06-24 MED ORDER — PROCHLORPERAZINE MALEATE 10 MG PO TABS
10.0000 mg | ORAL_TABLET | Freq: Four times a day (QID) | ORAL | 1 refills | Status: DC | PRN
  Filled 2021-06-24: qty 30, 8d supply, fill #0

## 2021-06-24 MED ORDER — ONDANSETRON HCL 8 MG PO TABS
8.0000 mg | ORAL_TABLET | Freq: Two times a day (BID) | ORAL | 1 refills | Status: DC | PRN
  Filled 2021-06-24: qty 30, 15d supply, fill #0

## 2021-06-24 MED ORDER — DEXAMETHASONE 4 MG PO TABS
20.0000 mg | ORAL_TABLET | Freq: Every day | ORAL | 11 refills | Status: DC
  Filled 2021-06-24: qty 20, 4d supply, fill #0

## 2021-06-24 MED ORDER — ACYCLOVIR 400 MG PO TABS
400.0000 mg | ORAL_TABLET | Freq: Two times a day (BID) | ORAL | 11 refills | Status: DC
  Filled 2021-06-24 – 2021-11-22 (×2): qty 60, 30d supply, fill #0
  Filled 2022-02-16: qty 60, 30d supply, fill #1
  Filled 2022-05-02: qty 60, 30d supply, fill #2

## 2021-06-24 NOTE — Telephone Encounter (Signed)
Pt is due for a follow up. Please contact pt and schedule

## 2021-06-24 NOTE — Telephone Encounter (Signed)
Copied from [place] [id number]. Topic: Appointment Scheduling - Scheduling Inquiry for Clinic >> Jun 23, 2021 11:38 AM Tessa Lerner A wrote: Reason for CRM: The patient would like to know if they need to be seen for a follow up   The patient declined to schedule at the time of call with agent but would like to know if their PCP would like them to come in   Please contact further when available

## 2021-06-24 NOTE — Addendum Note (Signed)
Addended by: Sullivan Lone on: 06/24/2021 01:52 PM   Modules accepted: Orders

## 2021-06-24 NOTE — Progress Notes (Signed)
DISCONTINUE ON PATHWAY REGIMEN - Multiple Myeloma and Other Plasma Cell Dyscrasias     A cycle is every 21 days:     Bortezomib      Lenalidomide      Dexamethasone   **Always confirm dose/schedule in your pharmacy ordering system**  REASON: Disease Progression PRIOR TREATMENT: [id number]: VRd (Bortezomib 1.3 mg/m2 Subcut D1, 4, 8, 11 + Lenalidomide 25 mg + Dexamethasone 20 mg) q21 Days x 4-6 Cycles Maximum Prior to St[employer] TREATMENT RESPONSE: Complete Response (CR)  START ON PATHWAY REGIMEN - Multiple Myeloma and Other Plasma Cell Dyscrasias     Cycles 1 and 2: A cycle is every 28 days:     Pomalidomide      Dexamethasone      Daratumumab    Cycles 3 through 6: A cycle is every 28 days:     Pomalidomide      Dexamethasone      Dexamethasone      Daratumumab    Cycles 7 and beyond: A cycle is every 28 days:     Pomalidomide      Dexamethasone      Dexamethasone      Daratumumab   **Always confirm dose/schedule in your pharmacy ordering system**  Patient Characteristics: Multiple Myeloma, Relapsed / Refractory, Second through Fourth Lines of Therapy, Fit or Candidate for Triplet Therapy, Lenalidomide-Refractory or Lenalidomide-based Regimen Not Preferred, Candidate for Anti-CD38 Antibody Disease Classification: Multiple Myeloma R-ISS Staging: I Therapeutic Status: Relapsed Line of Therapy: Second Line Anti-CD38 Antibody Candidacy: Candidate for Anti-CD38 Antibody Lenalidomide-based Regimen Preference/Candidacy: Lenalidomide-based Regimen Not Preferred Intent of Therapy: Non-Curative / Palliative Intent, Discussed with Patient

## 2021-07-06 ENCOUNTER — Other Ambulatory Visit: Payer: Self-pay | Admitting: Internal Medicine

## 2021-07-06 ENCOUNTER — Encounter: Payer: Self-pay | Admitting: Internal Medicine

## 2021-07-06 ENCOUNTER — Other Ambulatory Visit: Payer: Self-pay

## 2021-07-06 MED ORDER — BD PEN NEEDLE NANO U/F 32G X 4 MM MISC
6 refills | Status: DC
  Filled 2021-07-06 – 2021-09-05 (×2): qty 100, 50d supply, fill #0
  Filled 2021-11-09: qty 100, 50d supply, fill #1
  Filled 2022-01-25: qty 100, 50d supply, fill #2
  Filled 2022-03-27: qty 100, 50d supply, fill #3
  Filled 2022-05-15: qty 100, 50d supply, fill #4

## 2021-07-06 NOTE — Telephone Encounter (Signed)
Requested Prescriptions  Pending Prescriptions Disp Refills   Insulin Pen Needle (BD PEN NEEDLE NANO U/F) 32G X 4 MM MISC 100 each 6    Sig: USE AS DIRECTED TWICE DAILY WITH INSULIN     Endocrinology: Diabetes - Testing Supplies Passed - 07/06/2021  9:03 AM      Passed - Valid encounter within last 12 months    Recent Outpatient Visits          6 months ago Type 2 diabetes mellitus with peripheral neuropathy [facility])   [place] Karle Plumber B, MD   10 months ago Type 2 diabetes mellitus with peripheral neuropathy [facility])   [place] [name], MD   1 year ago Medicare annual wellness visit, initial   [place], [place] J, NP   1 year ago Type 2 diabetes mellitus with peripheral neuropathy [facility])   [place], Deborah B, MD   1 year ago Controlled type 2 diabetes mellitus with stage 3 chronic kidney disease, with long-term current use of insulin [facility])   [place], MD      Future Appointments            In 1 month Wynetta Emery, Dalbert Batman, MD [place]

## 2021-07-06 NOTE — Progress Notes (Signed)
Pharmacist Chemotherapy Monitoring - Initial Assessment    Anticipated start date: 07/14/21   The following has been reviewed per standard work regarding the patient's treatment regimen: The patient's diagnosis, treatment plan and drug doses, and organ/hematologic function Lab orders and baseline tests specific to treatment regimen  The treatment plan start date, drug sequencing, and pre-medications Prior authorization status  Patient's documented medication list, including drug-drug interaction screen and prescriptions for anti-emetics and supportive care specific to the treatment regimen The drug concentrations, fluid compatibility, administration routes, and timing of the medications to be used The patient's access for treatment and lifetime cumulative dose history, if applicable  The patient's medication allergies and previous infusion related reactions, if applicable   Changes made to treatment plan:  N/A  Follow up needed:  N/A   Philomena Course, RPH, 07/06/2021  1:19 PM

## 2021-07-06 NOTE — Progress Notes (Signed)
Note received from patient's nephrologist Dr. Posey Pronto.  Patient was seen 06/23/2021. Creatinine 1.37/GFR 52 H/H 12.8/37.7, WBC of 5.2 Intact PTH 45 Urinalysis: 3+ protein.

## 2021-07-07 ENCOUNTER — Other Ambulatory Visit: Payer: Self-pay

## 2021-07-08 NOTE — Progress Notes (Signed)
Patient with history of myeloma and diabetes mellitus on steroids to monitor for worsening hyperglycemia.

## 2021-07-10 ENCOUNTER — Encounter: Payer: Self-pay | Admitting: Hematology

## 2021-07-13 ENCOUNTER — Inpatient Hospital Stay: Payer: Medicare HMO | Attending: Hematology

## 2021-07-13 ENCOUNTER — Other Ambulatory Visit: Payer: Self-pay

## 2021-07-13 DIAGNOSIS — Z7189 Other specified counseling: Secondary | ICD-10-CM

## 2021-07-13 DIAGNOSIS — E1122 Type 2 diabetes mellitus with diabetic chronic kidney disease: Secondary | ICD-10-CM | POA: Insufficient documentation

## 2021-07-13 DIAGNOSIS — C9002 Multiple myeloma in relapse: Secondary | ICD-10-CM | POA: Insufficient documentation

## 2021-07-13 DIAGNOSIS — I129 Hypertensive chronic kidney disease with stage 1 through stage 4 chronic kidney disease, or unspecified chronic kidney disease: Secondary | ICD-10-CM | POA: Insufficient documentation

## 2021-07-13 DIAGNOSIS — N189 Chronic kidney disease, unspecified: Secondary | ICD-10-CM | POA: Insufficient documentation

## 2021-07-13 DIAGNOSIS — Z5112 Encounter for antineoplastic immunotherapy: Secondary | ICD-10-CM | POA: Diagnosis not present

## 2021-07-13 LAB — CMP (CANCER CENTER ONLY)
ALT: 28 U/L (ref 0–44)
AST: 25 U/L (ref 15–41)
Albumin: 4.1 g/dL (ref 3.5–5.0)
Alkaline Phosphatase: 51 U/L (ref 38–126)
Anion gap: 8 (ref 5–15)
BUN: 20 mg/dL (ref 8–23)
CO2: 27 mmol/L (ref 22–32)
Calcium: 9.8 mg/dL (ref 8.9–10.3)
Chloride: 103 mmol/L (ref 98–111)
Creatinine: 1.48 mg/dL — ABNORMAL HIGH (ref 0.61–1.24)
GFR, Estimated: 50 mL/min — ABNORMAL LOW (ref >60)
Glucose, Bld: 170 mg/dL — ABNORMAL HIGH (ref 70–99)
Potassium: 3.7 mmol/L (ref 3.5–5.1)
Sodium: 138 mmol/L (ref 135–145)
Total Bilirubin: 0.7 mg/dL (ref 0.3–1.2)
Total Protein: 7.5 g/dL (ref 6.5–8.1)

## 2021-07-13 LAB — CBC WITH DIFFERENTIAL/PLATELET
Abs Immature Granulocytes: 0.01 10*3/uL (ref 0.00–0.07)
Basophils Absolute: 0 10*3/uL (ref 0.0–0.1)
Basophils Relative: 1 %
Eosinophils Absolute: 0.8 10*3/uL — ABNORMAL HIGH (ref 0.0–0.5)
Eosinophils Relative: 14 %
HCT: 35.4 % — ABNORMAL LOW (ref 39.0–52.0)
Hemoglobin: 12.3 g/dL — ABNORMAL LOW (ref 13.0–17.0)
Immature Granulocytes: 0 %
Lymphocytes Relative: 22 %
Lymphs Abs: 1.3 10*3/uL (ref 0.7–4.0)
MCH: 34.6 pg — ABNORMAL HIGH (ref 26.0–34.0)
MCHC: 34.7 g/dL (ref 30.0–36.0)
MCV: 99.4 fL (ref 80.0–100.0)
Monocytes Absolute: 0.4 10*3/uL (ref 0.1–1.0)
Monocytes Relative: 8 %
Neutro Abs: 3.1 10*3/uL (ref 1.7–7.7)
Neutrophils Relative %: 55 %
Platelets: 173 10*3/uL (ref 150–400)
RBC: 3.56 MIL/uL — ABNORMAL LOW (ref 4.22–5.81)
RDW: 13.2 % (ref 11.5–15.5)
WBC: 5.6 10*3/uL (ref 4.0–10.5)
nRBC: 0 % (ref 0.0–0.2)

## 2021-07-13 LAB — TYPE AND SCREEN
ABO/RH(D): O POS
Antibody Screen: NEGATIVE

## 2021-07-13 LAB — PRETREATMENT RBC PHENOTYPE

## 2021-07-13 MED FILL — Dexamethasone Sodium Phosphate Inj 100 MG/10ML: INTRAMUSCULAR | Qty: 2 | Status: AC

## 2021-07-14 ENCOUNTER — Inpatient Hospital Stay: Payer: Medicare HMO

## 2021-07-14 VITALS — BP 145/73 | HR 83 | Temp 98.0°F | Resp 17 | Wt 163.4 lb

## 2021-07-14 DIAGNOSIS — C9002 Multiple myeloma in relapse: Secondary | ICD-10-CM

## 2021-07-14 DIAGNOSIS — Z7189 Other specified counseling: Secondary | ICD-10-CM

## 2021-07-14 DIAGNOSIS — Z5112 Encounter for antineoplastic immunotherapy: Secondary | ICD-10-CM | POA: Diagnosis not present

## 2021-07-14 DIAGNOSIS — I129 Hypertensive chronic kidney disease with stage 1 through stage 4 chronic kidney disease, or unspecified chronic kidney disease: Secondary | ICD-10-CM | POA: Diagnosis not present

## 2021-07-14 DIAGNOSIS — E1122 Type 2 diabetes mellitus with diabetic chronic kidney disease: Secondary | ICD-10-CM | POA: Diagnosis not present

## 2021-07-14 DIAGNOSIS — N189 Chronic kidney disease, unspecified: Secondary | ICD-10-CM | POA: Diagnosis not present

## 2021-07-14 LAB — KAPPA/LAMBDA LIGHT CHAINS
Kappa free light chain: 46.9 mg/L — ABNORMAL HIGH (ref 3.3–19.4)
Kappa, lambda light chain ratio: 0.17 — ABNORMAL LOW (ref 0.26–1.65)
Lambda free light chains: 273.8 mg/L — ABNORMAL HIGH (ref 5.7–26.3)

## 2021-07-14 MED ORDER — ACETAMINOPHEN 325 MG PO TABS
650.0000 mg | ORAL_TABLET | Freq: Once | ORAL | Status: AC
  Administered 2021-07-14: 650 mg via ORAL
  Filled 2021-07-14: qty 2

## 2021-07-14 MED ORDER — SODIUM CHLORIDE 0.9 % IV SOLN: Freq: Once | INTRAVENOUS | Status: AC

## 2021-07-14 MED ORDER — MONTELUKAST SODIUM 10 MG PO TABS
10.0000 mg | ORAL_TABLET | Freq: Once | ORAL | Status: AC
  Administered 2021-07-14: 10 mg via ORAL
  Filled 2021-07-14: qty 1

## 2021-07-14 MED ORDER — SODIUM CHLORIDE 0.9 % IV SOLN
16.0000 mg/kg | Freq: Once | INTRAVENOUS | Status: AC
  Administered 2021-07-14: 1200 mg via INTRAVENOUS
  Filled 2021-07-14: qty 60

## 2021-07-14 MED ORDER — SODIUM CHLORIDE 0.9 % IV SOLN
20.0000 mg | Freq: Once | INTRAVENOUS | Status: AC
  Administered 2021-07-14: 20 mg via INTRAVENOUS
  Filled 2021-07-14: qty 20

## 2021-07-14 MED ORDER — FAMOTIDINE 20 MG IN NS 100 ML IVPB
20.0000 mg | Freq: Once | INTRAVENOUS | Status: AC
  Administered 2021-07-14: 20 mg via INTRAVENOUS
  Filled 2021-07-14: qty 100

## 2021-07-14 MED ORDER — DIPHENHYDRAMINE HCL 25 MG PO CAPS
50.0000 mg | ORAL_CAPSULE | Freq: Once | ORAL | Status: AC
  Administered 2021-07-14: 50 mg via ORAL
  Filled 2021-07-14: qty 2

## 2021-07-14 NOTE — Patient Instructions (Signed)
[facility]  Discharge Instructions: Thank you for choosing [facility] to provide your oncology and hematology care.   If you have a lab appointment with the [facility], please go directly to the [facility] and check in at the registration area.   Wear comfortable clothing and clothing appropriate for easy access to any Portacath or PICC line.   We strive to give you quality time with your provider. You may need to reschedule your appointment if you arrive late (15 or more minutes).  Arriving late affects you and other patients whose appointments are after yours.  Also, if you miss three or more appointments without notifying the office, you may be dismissed from the clinic at the providers discretion.      For prescription refill requests, have your pharmacy contact our office and allow 72 hours for refills to be completed.    Today you received the following chemotherapy and/or immunotherapy agents darzalex      To help prevent nausea and vomiting after your treatment, we encourage you to take your nausea medication as directed.  BELOW ARE SYMPTOMS THAT SHOULD BE REPORTED IMMEDIATELY: *FEVER GREATER THAN 100.4 F (38 C) OR HIGHER *CHILLS OR SWEATING *NAUSEA AND VOMITING THAT IS NOT CONTROLLED WITH YOUR NAUSEA MEDICATION *UNUSUAL SHORTNESS OF BREATH *UNUSUAL BRUISING OR BLEEDING *URINARY PROBLEMS (pain or burning when urinating, or frequent urination) *BOWEL PROBLEMS (unusual diarrhea, constipation, pain near the anus) TENDERNESS IN MOUTH AND THROAT WITH OR WITHOUT PRESENCE OF ULCERS (sore throat, sores in mouth, or a toothache) UNUSUAL RASH, SWELLING OR PAIN  UNUSUAL VAGINAL DISCHARGE OR ITCHING   Items with * indicate a potential emergency and should be followed up as soon as possible or go to the Emergency Department if any problems should occur.  Please show the CHEMOTHERAPY ALERT CARD or IMMUNOTHERAPY ALERT CARD at check-in to  the Emergency Department and triage nurse.  Should you have questions after your visit or need to cancel or reschedule your appointment, please contact [facility]  Dept: [phone]  and follow the prompts.  Office hours are 8:00 a.m. to 4:30 p.m. Monday - Friday. Please note that voicemails left after 4:00 p.m. may not be returned until the following business day.  We are closed weekends and major holidays. You have access to a nurse at all times for urgent questions. Please call the main number to the clinic Dept: [phone] and follow the prompts.   For any non-urgent questions, you may also contact your provider using MyChart. We now offer e-Visits for anyone 48 and older to request care online for non-urgent symptoms. For details visit [email or web address].   Also download the MyChart app! Go to the app store, search "MyChart", open the app, select [facility], and log in with your MyChart username and password.  Due to Covid, a mask is required upon entering the hospital/clinic. If you do not have a mask, one will be given to you upon arrival. For doctor visits, patients may have 1 support person aged 66 or older with them. For treatment visits, patients cannot have anyone with them due to current Covid guidelines and our immunocompromised population.   Daratumumab injection What is this medication? DARATUMUMAB (dar a toom ue mab) is a monoclonal antibody. It is used to treat multiple myeloma. This medicine may be used for other purposes; ask your health care provider or pharmacist if you have questions. COMMON BRAND NAME(S): DARZALEX What should I  tell my care team before I take this medication? They need to know if you have any of these conditions: hereditary fructose intolerance infection (especially a virus infection such as chickenpox, herpes, or hepatitis B virus) lung or breathing disease (asthma, COPD) an unusual or allergic reaction to  daratumumab, sorbitol, other medicines, foods, dyes, or preservatives pregnant or trying to get pregnant breast-feeding How should I use this medication? This medicine is for infusion into a vein. It is given by a health care professional in a hospital or clinic setting. Talk to your pediatrician regarding the use of this medicine in children. Special care may be needed. Overdosage: If you think you have taken too much of this medicine contact a poison control center or emergency room at once. NOTE: This medicine is only for you. Do not share this medicine with others. What if I miss a dose? Keep appointments for follow-up doses as directed. It is important not to miss your dose. Call your doctor or health care professional if you are unable to keep an appointment. What may interact with this medication? Interactions have not been studied. This list may not describe all possible interactions. Give your health care provider a list of all the medicines, herbs, non-prescription drugs, or dietary supplements you use. Also tell them if you smoke, drink alcohol, or use illegal drugs. Some items may interact with your medicine. What should I watch for while using this medication? Your condition will be monitored carefully while you are receiving this medicine. This medicine can cause serious allergic reactions. To reduce your risk, your health care provider may give you other medicine to take before receiving this one. Be sure to follow the directions from your health care provider. This medicine can affect the results of blood tests to match your blood type. These changes can last for up to 6 months after the final dose. Your healthcare provider will do blood tests to match your blood type before you start treatment. Tell all of your healthcare providers that you are being treated with this medicine before receiving a blood transfusion. This medicine can affect the results of some tests used to determine  treatment response; extra tests may be needed to evaluate response. Do not become pregnant while taking this medicine or for 3 months after stopping it. Women should inform their health care provider if they wish to become pregnant or think they might be pregnant. There is a potential for serious side effects to an unborn child. Talk to your health care provider for more information. Do not breast-feed an infant while taking this medicine. What side effects may I notice from receiving this medication? Side effects that you should report to your care team as soon as possible: Allergic reactions--skin rash, itching or hives, swelling of the face, lips, or tongue Blurred vision Infection--fever, chills, cough, sore throat, pain or trouble passing urine Infusion reactions--dizziness, fast heartbeat, feeling faint or lightheaded, falls, headache, increase in blood pressure, nausea, vomiting, or wheezing or trouble breathing with loud or whistling sounds Unusual bleeding or bruising Side effects that usually do not require medical attention (report to your care team if they continue or are bothersome): Constipation Diarrhea Pain, tingling, numbness in the hands or feet Swelling of the ankles, feet, hands Tiredness This list may not describe all possible side effects. Call your doctor for medical advice about side effects. You may report side effects to FDA at 1-800-FDA-1088. Where should I keep my medication? This drug is given in a  hospital or clinic and will not be stored at home. NOTE: This sheet is a summary. It may not cover all possible information. If you have questions about this medicine, talk to your doctor, pharmacist, or health care provider.  2022 [employer]vier/Gold Standard (2020-12-02 00:00:00)

## 2021-07-18 ENCOUNTER — Other Ambulatory Visit: Payer: Self-pay | Admitting: Pharmacist

## 2021-07-18 ENCOUNTER — Other Ambulatory Visit: Payer: Self-pay | Admitting: Internal Medicine

## 2021-07-18 ENCOUNTER — Other Ambulatory Visit: Payer: Self-pay

## 2021-07-18 ENCOUNTER — Encounter: Payer: Self-pay | Admitting: Hematology

## 2021-07-18 DIAGNOSIS — E785 Hyperlipidemia, unspecified: Secondary | ICD-10-CM

## 2021-07-18 DIAGNOSIS — E1169 Type 2 diabetes mellitus with other specified complication: Secondary | ICD-10-CM

## 2021-07-18 LAB — MULTIPLE MYELOMA PANEL, SERUM
Albumin SerPl Elph-Mcnc: 3.9 g/dL (ref 2.9–4.4)
Albumin/Glob SerPl: 1.3 (ref 0.7–1.7)
Alpha 1: 0.2 g/dL (ref 0.0–0.4)
Alpha2 Glob SerPl Elph-Mcnc: 0.8 g/dL (ref 0.4–1.0)
B-Globulin SerPl Elph-Mcnc: 1.3 g/dL (ref 0.7–1.3)
Gamma Glob SerPl Elph-Mcnc: 0.8 g/dL (ref 0.4–1.8)
Globulin, Total: 3.1 g/dL (ref 2.2–3.9)
IgA: 766 mg/dL — ABNORMAL HIGH (ref 61–437)
IgG (Immunoglobin G), Serum: 798 mg/dL (ref 603–1613)
IgM (Immunoglobulin M), Srm: 73 mg/dL (ref 15–143)
M Protein SerPl Elph-Mcnc: 0.7 g/dL — ABNORMAL HIGH
Total Protein ELP: 7 g/dL (ref 6.0–8.5)

## 2021-07-18 MED ORDER — ACCU-CHEK SOFTCLIX LANCETS MISC
3 refills | Status: DC
  Filled 2021-07-18: qty 100, 30d supply, fill #0

## 2021-07-18 MED ORDER — ACCU-CHEK GUIDE VI STRP
ORAL_STRIP | 2 refills | Status: DC
  Filled 2021-07-18: qty 100, 30d supply, fill #0
  Filled 2021-08-09 – 2021-08-10 (×2): qty 100, 30d supply, fill #1
  Filled 2021-09-14: qty 100, 30d supply, fill #2

## 2021-07-18 MED ORDER — GABAPENTIN 300 MG PO CAPS
ORAL_CAPSULE | ORAL | 6 refills | Status: DC
  Filled 2021-07-18: qty 120, 30d supply, fill #0
  Filled 2021-08-22: qty 120, 30d supply, fill #1
  Filled 2021-10-04: qty 120, 30d supply, fill #2
  Filled 2021-11-09: qty 120, 30d supply, fill #3
  Filled 2021-12-16: qty 120, 30d supply, fill #4
  Filled 2022-01-25: qty 120, 30d supply, fill #5
  Filled 2022-03-02: qty 120, 30d supply, fill #6

## 2021-07-18 MED ORDER — PRAVASTATIN SODIUM 20 MG PO TABS
ORAL_TABLET | Freq: Every day | ORAL | 0 refills | Status: DC
  Filled 2021-07-18: qty 30, 30d supply, fill #0

## 2021-07-18 MED ORDER — ACCU-CHEK GUIDE W/DEVICE KIT
PACK | 0 refills | Status: DC
  Filled 2021-07-18: qty 1, 30d supply, fill #0

## 2021-07-19 ENCOUNTER — Other Ambulatory Visit: Payer: Self-pay

## 2021-07-21 ENCOUNTER — Other Ambulatory Visit: Payer: Self-pay

## 2021-07-21 ENCOUNTER — Inpatient Hospital Stay: Payer: Medicare HMO

## 2021-07-21 ENCOUNTER — Other Ambulatory Visit: Payer: Self-pay | Admitting: Internal Medicine

## 2021-07-21 ENCOUNTER — Inpatient Hospital Stay (HOSPITAL_BASED_OUTPATIENT_CLINIC_OR_DEPARTMENT_OTHER): Payer: Medicare HMO | Admitting: Hematology

## 2021-07-21 VITALS — BP 140/69 | HR 73 | Temp 97.9°F | Resp 20 | Wt 164.6 lb

## 2021-07-21 VITALS — BP 134/80 | HR 83 | Temp 98.6°F | Resp 17

## 2021-07-21 DIAGNOSIS — E1122 Type 2 diabetes mellitus with diabetic chronic kidney disease: Secondary | ICD-10-CM | POA: Diagnosis not present

## 2021-07-21 DIAGNOSIS — C9002 Multiple myeloma in relapse: Secondary | ICD-10-CM

## 2021-07-21 DIAGNOSIS — Z5111 Encounter for antineoplastic chemotherapy: Secondary | ICD-10-CM

## 2021-07-21 DIAGNOSIS — C9001 Multiple myeloma in remission: Secondary | ICD-10-CM

## 2021-07-21 DIAGNOSIS — Z7189 Other specified counseling: Secondary | ICD-10-CM

## 2021-07-21 DIAGNOSIS — Z5112 Encounter for antineoplastic immunotherapy: Secondary | ICD-10-CM | POA: Diagnosis not present

## 2021-07-21 DIAGNOSIS — I129 Hypertensive chronic kidney disease with stage 1 through stage 4 chronic kidney disease, or unspecified chronic kidney disease: Secondary | ICD-10-CM | POA: Diagnosis not present

## 2021-07-21 DIAGNOSIS — N189 Chronic kidney disease, unspecified: Secondary | ICD-10-CM | POA: Diagnosis not present

## 2021-07-21 DIAGNOSIS — I10 Essential (primary) hypertension: Secondary | ICD-10-CM

## 2021-07-21 LAB — CBC WITH DIFFERENTIAL (CANCER CENTER ONLY)
Abs Immature Granulocytes: 0.01 10*3/uL (ref 0.00–0.07)
Basophils Absolute: 0 10*3/uL (ref 0.0–0.1)
Basophils Relative: 1 %
Eosinophils Absolute: 0.7 10*3/uL — ABNORMAL HIGH (ref 0.0–0.5)
Eosinophils Relative: 11 %
HCT: 30.9 % — ABNORMAL LOW (ref 39.0–52.0)
Hemoglobin: 11.1 g/dL — ABNORMAL LOW (ref 13.0–17.0)
Immature Granulocytes: 0 %
Lymphocytes Relative: 21 %
Lymphs Abs: 1.2 10*3/uL (ref 0.7–4.0)
MCH: 35.4 pg — ABNORMAL HIGH (ref 26.0–34.0)
MCHC: 35.9 g/dL (ref 30.0–36.0)
MCV: 98.4 fL (ref 80.0–100.0)
Monocytes Absolute: 0.4 10*3/uL (ref 0.1–1.0)
Monocytes Relative: 7 %
Neutro Abs: 3.5 10*3/uL (ref 1.7–7.7)
Neutrophils Relative %: 60 %
Platelet Count: 209 10*3/uL (ref 150–400)
RBC: 3.14 MIL/uL — ABNORMAL LOW (ref 4.22–5.81)
RDW: 13.2 % (ref 11.5–15.5)
WBC Count: 5.8 10*3/uL (ref 4.0–10.5)
nRBC: 0 % (ref 0.0–0.2)

## 2021-07-21 LAB — CMP (CANCER CENTER ONLY)
ALT: 22 U/L (ref 0–44)
AST: 19 U/L (ref 15–41)
Albumin: 4 g/dL (ref 3.5–5.0)
Alkaline Phosphatase: 73 U/L (ref 38–126)
Anion gap: 8 (ref 5–15)
BUN: 15 mg/dL (ref 8–23)
CO2: 26 mmol/L (ref 22–32)
Calcium: 9 mg/dL (ref 8.9–10.3)
Chloride: 106 mmol/L (ref 98–111)
Creatinine: 1.3 mg/dL — ABNORMAL HIGH (ref 0.61–1.24)
GFR, Estimated: 58 mL/min — ABNORMAL LOW (ref >60)
Glucose, Bld: 127 mg/dL — ABNORMAL HIGH (ref 70–99)
Potassium: 3.6 mmol/L (ref 3.5–5.1)
Sodium: 140 mmol/L (ref 135–145)
Total Bilirubin: 0.8 mg/dL (ref 0.3–1.2)
Total Protein: 6.9 g/dL (ref 6.5–8.1)

## 2021-07-21 MED ORDER — SODIUM CHLORIDE 0.9 % IV SOLN
20.0000 mg | Freq: Once | INTRAVENOUS | Status: AC
  Administered 2021-07-21: 20 mg via INTRAVENOUS
  Filled 2021-07-21: qty 20

## 2021-07-21 MED ORDER — FAMOTIDINE 20 MG IN NS 100 ML IVPB
20.0000 mg | Freq: Once | INTRAVENOUS | Status: AC
  Administered 2021-07-21: 20 mg via INTRAVENOUS
  Filled 2021-07-21: qty 100

## 2021-07-21 MED ORDER — ACETAMINOPHEN 325 MG PO TABS
650.0000 mg | ORAL_TABLET | Freq: Once | ORAL | Status: AC
  Administered 2021-07-21: 650 mg via ORAL
  Filled 2021-07-21: qty 2

## 2021-07-21 MED ORDER — SODIUM CHLORIDE 0.9 % IV SOLN: Freq: Once | INTRAVENOUS | Status: AC

## 2021-07-21 MED ORDER — MONTELUKAST SODIUM 10 MG PO TABS
10.0000 mg | ORAL_TABLET | Freq: Once | ORAL | Status: AC
  Administered 2021-07-21: 10 mg via ORAL
  Filled 2021-07-21: qty 1

## 2021-07-21 MED ORDER — SODIUM CHLORIDE 0.9 % IV SOLN
16.0000 mg/kg | Freq: Once | INTRAVENOUS | Status: AC
  Administered 2021-07-21: 1200 mg via INTRAVENOUS
  Filled 2021-07-21: qty 60

## 2021-07-21 MED ORDER — AMLODIPINE BESYLATE 10 MG PO TABS
ORAL_TABLET | Freq: Every day | ORAL | 0 refills | Status: DC
  Filled 2021-07-21: qty 30, 30d supply, fill #0

## 2021-07-21 MED ORDER — DIPHENHYDRAMINE HCL 25 MG PO CAPS
50.0000 mg | ORAL_CAPSULE | Freq: Once | ORAL | Status: AC
  Administered 2021-07-21: 50 mg via ORAL
  Filled 2021-07-21: qty 2

## 2021-07-21 NOTE — Telephone Encounter (Signed)
Requested medication (s) are due for refill today:   Yes  Requested medication (s) are on the active medication list:   Yes  Future visit scheduled:   Yes on 08/30/2021 with [name]   Last ordered: 05/10/2021 #30, 1 refill  Returned for provider to review for refills prior to appt. Since courtesy refill was given in Nov.   Requested Prescriptions  Pending Prescriptions Disp Refills   amLODipine (NORVASC) 10 MG tablet 30 tablet 1    Sig: TAKE 1 TABLET (10 MG TOTAL) BY MOUTH DAILY.     Cardiovascular:  Calcium Channel Blockers Failed - 07/21/2021  9:57 AM      Failed - Last BP in normal range    BP Readings from Last 1 Encounters:  07/21/21 140/69          Failed - Valid encounter within last 6 months    Recent Outpatient Visits           6 months ago Type 2 diabetes mellitus with peripheral neuropathy [facility])   [place] Karle Plumber B, MD   10 months ago Type 2 diabetes mellitus with peripheral neuropathy [facility])   [place] [name], MD   1 year ago Medicare annual wellness visit, initial   [place], [place] J, NP   1 year ago Type 2 diabetes mellitus with peripheral neuropathy [facility])   [place], Deborah B, MD   1 year ago Controlled type 2 diabetes mellitus with stage 3 chronic kidney disease, with long-term current use of insulin [facility])   [place], MD       Future Appointments             In 1 month Wynetta Emery, Dalbert Batman, MD [place]

## 2021-07-21 NOTE — Patient Instructions (Signed)
[facility]  Discharge Instructions: °Thank you for choosing [facility] to provide your oncology and hematology care.  ° °If you have a lab appointment with the Cancer Center, please go directly to the Cancer Center and check in at the registration area. °  °Wear comfortable clothing and clothing appropriate for easy access to any Portacath or PICC line.  ° °We strive to give you quality time with your provider. You may need to reschedule your appointment if you arrive late (15 or more minutes).  Arriving late affects you and other patients whose appointments are after yours.  Also, if you miss three or more appointments without notifying the office, you may be dismissed from the clinic at the provider’s discretion.    °  °For prescription refill requests, have your pharmacy contact our office and allow 72 hours for refills to be completed.   ° °Today you received the following chemotherapy and/or immunotherapy agents: Daratumumab.     °  °To help prevent nausea and vomiting after your treatment, we encourage you to take your nausea medication as directed. ° °BELOW ARE SYMPTOMS THAT SHOULD BE REPORTED IMMEDIATELY: °*FEVER GREATER THAN 100.4 F (38 °C) OR HIGHER °*CHILLS OR SWEATING °*NAUSEA AND VOMITING THAT IS NOT CONTROLLED WITH YOUR NAUSEA MEDICATION °*UNUSUAL SHORTNESS OF BREATH °*UNUSUAL BRUISING OR BLEEDING °*URINARY PROBLEMS (pain or burning when urinating, or frequent urination) °*BOWEL PROBLEMS (unusual diarrhea, constipation, pain near the anus) °TENDERNESS IN MOUTH AND THROAT WITH OR WITHOUT PRESENCE OF ULCERS (sore throat, sores in mouth, or a toothache) °UNUSUAL RASH, SWELLING OR PAIN  °UNUSUAL VAGINAL DISCHARGE OR ITCHING  ° °Items with * indicate a potential emergency and should be followed up as soon as possible or go to the Emergency Department if any problems should occur. ° °Please show the CHEMOTHERAPY ALERT CARD or IMMUNOTHERAPY ALERT CARD at check-in  to the Emergency Department and triage nurse. ° °Should you have questions after your visit or need to cancel or reschedule your appointment, please contact [facility]  Dept: [phone]  and follow the prompts.  Office hours are 8:00 a.m. to 4:30 p.m. Monday - Friday. Please note that voicemails left after 4:00 p.m. may not be returned until the following business day.  We are closed weekends and major holidays. You have access to a nurse at all times for urgent questions. Please call the main number to the clinic Dept: [phone] and follow the prompts. ° ° °For any non-urgent questions, you may also contact your provider using MyChart. We now offer e-Visits for anyone 18 and older to request care online for non-urgent symptoms. For details visit [email or web address]. °  °Also download the MyChart app! Go to the app store, search "MyChart", open the app, select , and log in with your MyChart username and password. ° °Due to Covid, a mask is required upon entering the hospital/clinic. If you do not have a mask, one will be given to you upon arrival. For doctor visits, patients may have 1 support person aged 18 or older with them. For treatment visits, patients cannot have anyone with them due to current Covid guidelines and our immunocompromised population.  ° °

## 2021-07-25 LAB — KAPPA/LAMBDA LIGHT CHAINS
Kappa free light chain: 31.8 mg/L — ABNORMAL HIGH (ref 3.3–19.4)
Kappa, lambda light chain ratio: 0.21 — ABNORMAL LOW (ref 0.26–1.65)
Lambda free light chains: 154.9 mg/L — ABNORMAL HIGH (ref 5.7–26.3)

## 2021-07-26 ENCOUNTER — Encounter: Payer: Self-pay | Admitting: Hematology

## 2021-07-27 ENCOUNTER — Encounter: Payer: Self-pay | Admitting: Hematology

## 2021-07-27 MED FILL — Dexamethasone Sodium Phosphate Inj 100 MG/10ML: INTRAMUSCULAR | Qty: 2 | Status: AC

## 2021-07-27 NOTE — Progress Notes (Addendum)
HEMATOLOGY/ONCOLOGY CLINIC NOTE  Date of Service: .07/21/2021   Patient Care Team: [name], MD as PCP - General (Internal Medicine) [name], MD (Inactive) as Consulting Physician (Hematology and Oncology)  CHIEF COMPLAINTS/PURPOSE OF CONSULTATION:  Follow-up for toxicity check for daratumumab and continued management of multiple myeloma.  Oncologic History:  76 y.o. male with diagnosis of IgA lambda active multiple myeloma, ISS Stage I. Active disease diagnosed based on presence of anemia, kidney insufficiency, and paraproteinemia with significant predominance of lambda light chains as well as significant elevation of IgA. Bone marrow biopsy confirmed presence of atypical monoclonal plasma cell process in the bone marrow comprising at least 7% of the cellularity. Based on the findings, patient was started on treatment with lenalidomide, bortezomib, and low-dose dexamethasone based on the anticipated tolerance by the patient. Lenalidomide was started at 10 mg daily based on creatinine clearance of 42, dexamethasone dose was reduced to 20 mg weekly based on patient's age. Treatment was complicated by rapid development of cutaneous rash attributed to lenalidomide, inaddition to decreased renal function and now patient has persistent severe anemia. Side-effects were attributed to Lenalidomide and lenalidomide was discontinued with subsequent recovery.  Patient has been receiving bortezomib weekly with low-dose dexamethasone in 4-day cycles.   Patient has completed induction systemic therapy for his disease with repeat bone marrow biopsy confirming complete response including no evidence of minimal residual disease by cytogenetics or FISH.  HISTORY OF PRESENTING ILLNESS:  Please see previous note for details of initial presentation.  [name] is here for follow-up prior to cycle 1 day 8 of daratumumab for toxicity check. Patient notes he  tolerated his first dose of daratumumab without any overt toxicities. No skin rashes. No fevers chills night sweats unexpected weight loss. No new bone pains. No hypersensitivity reactions.  Labs done today show CBC stable with a hemoglobin of 11.1, WBC count of 5.8k and platelets of 243k CMP stable potassium of 3.3 creatinine 1.24   MEDICAL HISTORY:  Past Medical History:  Diagnosis Date   Anemia    Arthritis    shoulders,feet    Cataract    per eye dr -has appt 5-11   CKD (chronic kidney disease), stage III (HCC)    Elevated PSA    History of ketoacidosis    03-29-2015    History of sepsis    07-25-2013  non-compliant w/ medication   Hyperlipidemia    Hypertension    Nocturia    Peripheral neuropathy    Prostate cancer ([place])    Type 2 diabetes mellitus with insulin therapy [facility])     SURGICAL HISTORY: Past Surgical History:  Procedure Laterality Date   CIRCUMCISION  1990's   PROSTATE BIOPSY N/A 12/21/2015   Procedure: BIOPSY TRANSRECTAL ULTRASONIC PROSTATE (TUBP);  Surgeon: Festus Aloe, MD;  Location: [facility];  Service: Urology;  Laterality: N/A;    SOCIAL HISTORY: Social History   Socioeconomic History   Marital status: Single    Spouse name: Not on file   Number of children: Not on file   Years of education: Not on file   Highest education level: Not on file  Occupational History   Not on file  Tobacco Use   Smoking status: Every Day    Packs/day: 0.50    Years: 50.00    Pack years: 25.00    Types: Cigarettes   Smokeless tobacco: Never   Tobacco comments:    1 ppwk-4-5  a day   Vaping Use   Vaping Use: Never used  Substance and Sexual Activity   Alcohol use: Yes    Alcohol/week: 1.0 standard drink    Types: 1 Cans of beer per week    Comment: 1 every day-quit couple weeks ago   Drug use: No   Sexual activity: Not Currently  Other Topics Concern   Not on file  Social History Narrative   Not on file   Social Determinants  of Health   Financial Resource Strain: Not on file  Food Insecurity: Not on file  Transportation Needs: Not on file  Physical Activity: Not on file  Stress: Not on file  Social Connections: Not on file  Intimate Partner Violence: Not on file    FAMILY HISTORY: Family History  Problem Relation Age of Onset   Kidney disease Mother    Cancer Neg Hx    Colon cancer Neg Hx    Colon polyps Neg Hx    Esophageal cancer Neg Hx    Rectal cancer Neg Hx    Stomach cancer Neg Hx     ALLERGIES:  is allergic to other.  MEDICATIONS:  Current Outpatient Medications  Medication Sig Dispense Refill   Accu-Chek Softclix Lancets lancets Use to test blood sugar 3 times daily. 100 each 3   acyclovir (ZOVIRAX) 400 MG tablet TAKE 1 TABLET (400 MG TOTAL) BY MOUTH 2 (TWO) TIMES DAILY. 60 tablet 5   acyclovir (ZOVIRAX) 400 MG tablet Take 1 tablet (400 mg total) by mouth 2 (two) times daily. 60 tablet 11   amLODipine (NORVASC) 10 MG tablet TAKE 1 TABLET (10 MG TOTAL) BY MOUTH DAILY. 30 tablet 0   aspirin EC 81 MG tablet Take 1 tablet (81 mg total) by mouth daily. 90 tablet 3   b complex vitamins capsule Take 1 capsule by mouth daily. 30 capsule 5   Blood Glucose Monitoring Suppl (ACCU-CHEK GUIDE) w/Device KIT Use to test blood sugar three times daily 1 kit 0   buPROPion (WELLBUTRIN SR) 150 MG 12 hr tablet      Continuous Blood Gluc Sensor (FREESTYLE LIBRE SENSOR SYSTEM) MISC Change sensor Q 2 wks 2 each 12   dexamethasone (DECADRON) 4 MG tablet Take 5 tablets (20 mg total) by mouth daily. Take the day after daratumumab. Take with breakfast 20 tablet 11   diclofenac Sodium (VOLTAREN) 1 % GEL Apply 4 g topically 4 (four) times daily. 150 g 5   donepezil (ARICEPT) 5 MG tablet TAKE 1 TABLET (5 MG TOTAL) BY MOUTH AT BEDTIME. FOR MEMORY ISSUES 30 tablet 2   ferrous sulfate 325 (65 FE) MG tablet Take 325 mg by mouth daily.     gabapentin (NEURONTIN) 300 MG capsule TAKE 1 CAPSULE BY MOUTH IN THE MORNING AND  LUNCH, AND 2 AT BEDTIME 120 capsule 6   glucose blood (ACCU-CHEK GUIDE) test strip Use to test blood sugar 3 times daily. 100 each 2   hydrOXYzine (ATARAX/VISTARIL) 25 MG tablet TAKE 1 TABLET (25 MG TOTAL) BY MOUTH 3 (THREE) TIMES DAILY AS NEEDED FOR ITCHING (RASH). 30 tablet 0   Insulin Lispro Prot & Lispro (HUMALOG MIX 75/25 KWIKPEN) (75-25) 100 UNIT/ML Kwikpen Inject 6 Units into the skin in the morning and at bedtime. 15 mL 2   Insulin Pen Needle (BD PEN NEEDLE NANO U/F) 32G X 4 MM MISC USE AS DIRECTED TWICE DAILY WITH INSULIN 100 each 6   ixazomib citrate (NINLARO) 3 MG capsule Take 1 capsule (3  mg) by mouth weekly, 3 weeks on, 1 week off, repeat every 4 weeks. Take on an empty stomach 1hr before or 2hr after meals. 3 capsule 3   lidocaine (LIDODERM) 5 %      Miconazole Nitrate (ATHLETES FOOT POWDER SPRAY) 2 % AERP Spray between toes and under toes once daily. 130 g 4   Multiple Vitamin (MULTIVITAMIN WITH MINERALS) TABS tablet Take 1 tablet by mouth daily. 60 tablet 2   ondansetron (ZOFRAN) 8 MG tablet Take 1 tablet (8 mg total) by mouth 2 (two) times daily as needed (Nausea or vomiting). 30 tablet 1   pravastatin (PRAVACHOL) 20 MG tablet TAKE 1 TABLET (20 MG TOTAL) BY MOUTH DAILY. 30 tablet 0   prochlorperazine (COMPAZINE) 10 MG tablet Take 1 tablet (10 mg total) by mouth every 6 (six) hours as needed (Nausea or vomiting). 30 tablet 1   tadalafil (CIALIS) 20 MG tablet Take 20 mg by mouth as needed.     tiZANidine (ZANAFLEX) 2 MG tablet Take 1 tablet (2 mg total) by mouth at bedtime as needed for muscle spasms. 30 tablet 1   vitamin B-12 (CYANOCOBALAMIN) 100 MCG tablet Take 100 mcg by mouth daily.     Vitamins/Minerals TABS Take by mouth.     No current facility-administered medications for this visit.    .10 Point review of Systems was done is negative except as noted above.   PHYSICAL EXAMINATION: ECOG PERFORMANCE STATUS: 1 - Symptomatic but completely ambulatory Exam was given in a  chair  .BP 140/69    Pulse 73    Temp 97.9 F (36.6 C)    Resp 20    Wt 164 lb 9.6 oz (74.7 kg)    SpO2 100%    BMI 25.78 kg/m   . GENERAL:alert, in no acute distress and comfortable SKIN: no acute rashes, no significant lesions EYES: conjunctiva are pink and non-injected, sclera anicteric OROPHARYNX: MMM, no exudates, no oropharyngeal erythema or ulceration NECK: supple, no JVD LYMPH:  no palpable lymphadenopathy in the cervical, axillary or inguinal regions LUNGS: clear to auscultation b/l with normal respiratory effort HEART: regular rate & rhythm ABDOMEN:  normoactive bowel sounds , non tender, not distended. Extremity: no pedal edema PSYCH: alert & oriented x 3 with fluent speech NEURO: no focal motor/sensory deficits     LABORATORY DATA:  I have reviewed the data as listed  . CBC Latest Ref Rng & Units 07/28/2021 07/21/2021 07/13/2021  WBC 4.0 - 10.5 K/uL 5.8 5.8 5.6  Hemoglobin 13.0 - 17.0 g/dL 11.1(L) 11.1(L) 12.3(L)  Hematocrit 39.0 - 52.0 % 32.2(L) 30.9(L) 35.4(L)  Platelets 150 - 400 K/uL 243 209 173    . CMP Latest Ref Rng & Units 07/28/2021 07/21/2021 07/13/2021  Glucose 70 - 99 mg/dL 159(H) 127(H) 170(H)  BUN 8 - 23 mg/dL '14 15 20  ' Creatinine 0.61 - 1.24 mg/dL 1.24 1.30(H) 1.48(H)  Sodium 135 - 145 mmol/L 139 140 138  Potassium 3.5 - 5.1 mmol/L 3.3(L) 3.6 3.7  Chloride 98 - 111 mmol/L 106 106 103  CO2 22 - 32 mmol/L '24 26 27  ' Calcium 8.9 - 10.3 mg/dL 9.6 9.0 9.8  Total Protein 6.5 - 8.1 g/dL 6.7 6.9 7.5  Total Bilirubin 0.3 - 1.2 mg/dL 0.6 0.8 0.7  Alkaline Phos 38 - 126 U/L 63 73 51  AST 15 - 41 U/L '20 19 25  ' ALT 0 - 44 U/L '26 22 28     ' 08/29/17 BM Bx:    08/29/17  Cytogenetics:   03/13/17 Cytogenetics:        PATHOLOGY Surgical Pathology  CASE: [id number]  PATIENT: Robert Sparks  Bone Marrow Report      Clinical History: Multiple myeloma in relapse ([place]) ,([facility])      DIAGNOSIS:   BONE MARROW, ASPIRATE, CLOT, CORE:  -Hypercellular  bone marrow for age with plasma cell neoplasm  -See comment   PERIPHERAL BLOOD:  -Mild normocytic-normochromic anemia  -Eosinophilia   COMMENT:   The bone marrow is hypercellular for age with increased number of plasma  cells representing 8% of all cells in the aspirate associated with  numerous small clusters in the clot and biopsy sections. The plasma  cells display lambda light chain restriction consistent with plasma cell  neoplasm.  The background shows trilineage hematopoiesis with generally  nonspecific myeloid changes, likely secondary in nature in this setting.  Correlation with cytogenetic and FISH studies is recommended.      RADIOGRAPHIC STUDIES: I have personally reviewed the radiological images as listed and agreed with the findings in the report. No results found.  ASSESSMENT & PLAN:   76 y.o. male with   1. IgA Lambda Multiple Myeloma ISS Stage I    Active disease was previously diagnosed based on presence of anemia, kidney insufficiency, and paraproteinemia with significant predominance of lambda light chains as well as significant elevation of IgA. Patient is status post patient was treated with induction bortezomib Revlimid dexamethasone.  Had issues with skin rashes with Revlimid and this was discontinued.  Completed bortezomib dexamethasone induction and achieved complete remission. Was on maintenance Velcade 2 weeks which was then switched to maintenance Ninlaro. Patient had issues with worsening neuropathy which was a combination of Velcade and his diabetes.  Ninlaro was discontinued as well after maintenance of more than 2 years.  2.  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 Bone marrow biopsy shows 8% lambda restricted plasma cells M spike progressively increasing to 0.7 g/dL PET CT scan with no hypermetabolic osseous lesions  3.  Hypertension 4.  Diabetes  type 2 5 .chronic kidney disease due to hypertension and diabetes. 6.  Peripheral neuropathy related to diabetes and previous myeloma treatments. PLAN:  -Patient's labs today were reviewed CBC and CMP stable -Patient reports no notable toxicities from cycle 1 day 1 of daratumumab. -Patient appropriate to proceed with cycle 1 day 8 of daratumumab. -We will evaluate response in 1-2 cycles to determine if we need to add additional medications. -Please schedule cycle 2 of daratumumab weekly with labs.  MD visit with cycle 2-day 8 FOLLOW UP: -Please schedule cycle 2 of daratumumab weekly with labs.  MD visit with cycle 2-day 8  All of the patient's questions were answered with apparent satisfaction. The patient knows to call the clinic with any problems, questions or concerns.   Sullivan Lone MD [place] [facility] Hematology/Oncology Physician

## 2021-07-28 ENCOUNTER — Inpatient Hospital Stay: Payer: Medicare HMO

## 2021-07-28 ENCOUNTER — Other Ambulatory Visit: Payer: Self-pay

## 2021-07-28 VITALS — BP 132/73 | HR 73 | Temp 98.6°F | Resp 18 | Wt 166.5 lb

## 2021-07-28 DIAGNOSIS — C9002 Multiple myeloma in relapse: Secondary | ICD-10-CM

## 2021-07-28 DIAGNOSIS — C9001 Multiple myeloma in remission: Secondary | ICD-10-CM

## 2021-07-28 DIAGNOSIS — I129 Hypertensive chronic kidney disease with stage 1 through stage 4 chronic kidney disease, or unspecified chronic kidney disease: Secondary | ICD-10-CM | POA: Diagnosis not present

## 2021-07-28 DIAGNOSIS — N189 Chronic kidney disease, unspecified: Secondary | ICD-10-CM | POA: Diagnosis not present

## 2021-07-28 DIAGNOSIS — E1122 Type 2 diabetes mellitus with diabetic chronic kidney disease: Secondary | ICD-10-CM | POA: Diagnosis not present

## 2021-07-28 DIAGNOSIS — Z5112 Encounter for antineoplastic immunotherapy: Secondary | ICD-10-CM | POA: Diagnosis not present

## 2021-07-28 DIAGNOSIS — Z7189 Other specified counseling: Secondary | ICD-10-CM

## 2021-07-28 LAB — MULTIPLE MYELOMA PANEL, SERUM
Albumin SerPl Elph-Mcnc: 4.6 g/dL — ABNORMAL HIGH (ref 2.9–4.4)
Albumin/Glob SerPl: 1.3 (ref 0.7–1.7)
Alpha 1: 0.2 g/dL (ref 0.0–0.4)
Alpha2 Glob SerPl Elph-Mcnc: 1 g/dL (ref 0.4–1.0)
B-Globulin SerPl Elph-Mcnc: 1.5 g/dL — ABNORMAL HIGH (ref 0.7–1.3)
Gamma Glob SerPl Elph-Mcnc: 0.9 g/dL (ref 0.4–1.8)
Globulin, Total: 3.7 g/dL (ref 2.2–3.9)
IgA: 586 mg/dL — ABNORMAL HIGH (ref 61–437)
IgG (Immunoglobin G), Serum: 715 mg/dL (ref 603–1613)
IgM (Immunoglobulin M), Srm: 62 mg/dL (ref 15–143)
M Protein SerPl Elph-Mcnc: 0.7 g/dL — ABNORMAL HIGH
Total Protein ELP: 8.3 g/dL (ref 6.0–8.5)

## 2021-07-28 LAB — CBC WITH DIFFERENTIAL (CANCER CENTER ONLY)
Abs Immature Granulocytes: 0.03 10*3/uL (ref 0.00–0.07)
Basophils Absolute: 0 10*3/uL (ref 0.0–0.1)
Basophils Relative: 1 %
Eosinophils Absolute: 0.4 10*3/uL (ref 0.0–0.5)
Eosinophils Relative: 7 %
HCT: 32.2 % — ABNORMAL LOW (ref 39.0–52.0)
Hemoglobin: 11.1 g/dL — ABNORMAL LOW (ref 13.0–17.0)
Immature Granulocytes: 1 %
Lymphocytes Relative: 17 %
Lymphs Abs: 1 10*3/uL (ref 0.7–4.0)
MCH: 33.6 pg (ref 26.0–34.0)
MCHC: 34.5 g/dL (ref 30.0–36.0)
MCV: 97.6 fL (ref 80.0–100.0)
Monocytes Absolute: 0.4 10*3/uL (ref 0.1–1.0)
Monocytes Relative: 6 %
Neutro Abs: 4 10*3/uL (ref 1.7–7.7)
Neutrophils Relative %: 68 %
Platelet Count: 243 10*3/uL (ref 150–400)
RBC: 3.3 MIL/uL — ABNORMAL LOW (ref 4.22–5.81)
RDW: 12.9 % (ref 11.5–15.5)
WBC Count: 5.8 10*3/uL (ref 4.0–10.5)
nRBC: 0 % (ref 0.0–0.2)

## 2021-07-28 LAB — CMP (CANCER CENTER ONLY)
ALT: 26 U/L (ref 0–44)
AST: 20 U/L (ref 15–41)
Albumin: 3.9 g/dL (ref 3.5–5.0)
Alkaline Phosphatase: 63 U/L (ref 38–126)
Anion gap: 9 (ref 5–15)
BUN: 14 mg/dL (ref 8–23)
CO2: 24 mmol/L (ref 22–32)
Calcium: 9.6 mg/dL (ref 8.9–10.3)
Chloride: 106 mmol/L (ref 98–111)
Creatinine: 1.24 mg/dL (ref 0.61–1.24)
GFR, Estimated: >60 mL/min (ref >60)
Glucose, Bld: 159 mg/dL — ABNORMAL HIGH (ref 70–99)
Potassium: 3.3 mmol/L — ABNORMAL LOW (ref 3.5–5.1)
Sodium: 139 mmol/L (ref 135–145)
Total Bilirubin: 0.6 mg/dL (ref 0.3–1.2)
Total Protein: 6.7 g/dL (ref 6.5–8.1)

## 2021-07-28 MED ORDER — SODIUM CHLORIDE 0.9 % IV SOLN
16.0000 mg/kg | Freq: Once | INTRAVENOUS | Status: AC
  Administered 2021-07-28: 1200 mg via INTRAVENOUS
  Filled 2021-07-28: qty 60

## 2021-07-28 MED ORDER — SODIUM CHLORIDE 0.9 % IV SOLN
20.0000 mg | Freq: Once | INTRAVENOUS | Status: AC
  Administered 2021-07-28: 20 mg via INTRAVENOUS
  Filled 2021-07-28: qty 20

## 2021-07-28 MED ORDER — MONTELUKAST SODIUM 10 MG PO TABS
10.0000 mg | ORAL_TABLET | Freq: Once | ORAL | Status: AC
  Administered 2021-07-28: 10 mg via ORAL
  Filled 2021-07-28: qty 1

## 2021-07-28 MED ORDER — SODIUM CHLORIDE 0.9 % IV SOLN: Freq: Once | INTRAVENOUS | Status: AC

## 2021-07-28 MED ORDER — FAMOTIDINE 20 MG IN NS 100 ML IVPB
20.0000 mg | Freq: Once | INTRAVENOUS | Status: AC
  Administered 2021-07-28: 20 mg via INTRAVENOUS
  Filled 2021-07-28: qty 100

## 2021-07-28 MED ORDER — DIPHENHYDRAMINE HCL 25 MG PO CAPS
50.0000 mg | ORAL_CAPSULE | Freq: Once | ORAL | Status: AC
  Administered 2021-07-28: 50 mg via ORAL
  Filled 2021-07-28: qty 2

## 2021-07-28 MED ORDER — ACETAMINOPHEN 325 MG PO TABS
650.0000 mg | ORAL_TABLET | Freq: Once | ORAL | Status: AC
  Administered 2021-07-28: 650 mg via ORAL
  Filled 2021-07-28: qty 2

## 2021-07-28 NOTE — Patient Instructions (Signed)
[facility] MEDICAL ONCOLOGY  Discharge Instructions: °Thank you for choosing [facility] to provide your oncology and hematology care.  ° °If you have a lab appointment with the Cancer Center, please go directly to the Cancer Center and check in at the registration area. °  °Wear comfortable clothing and clothing appropriate for easy access to any Portacath or PICC line.  ° °We strive to give you quality time with your provider. You may need to reschedule your appointment if you arrive late (15 or more minutes).  Arriving late affects you and other patients whose appointments are after yours.  Also, if you miss three or more appointments without notifying the office, you may be dismissed from the clinic at the provider’s discretion.    °  °For prescription refill requests, have your pharmacy contact our office and allow 72 hours for refills to be completed.   ° °Today you received the following chemotherapy and/or immunotherapy agents: Daratumumab.     °  °To help prevent nausea and vomiting after your treatment, we encourage you to take your nausea medication as directed. ° °BELOW ARE SYMPTOMS THAT SHOULD BE REPORTED IMMEDIATELY: °*FEVER GREATER THAN 100.4 F (38 °C) OR HIGHER °*CHILLS OR SWEATING °*NAUSEA AND VOMITING THAT IS NOT CONTROLLED WITH YOUR NAUSEA MEDICATION °*UNUSUAL SHORTNESS OF BREATH °*UNUSUAL BRUISING OR BLEEDING °*URINARY PROBLEMS (pain or burning when urinating, or frequent urination) °*BOWEL PROBLEMS (unusual diarrhea, constipation, pain near the anus) °TENDERNESS IN MOUTH AND THROAT WITH OR WITHOUT PRESENCE OF ULCERS (sore throat, sores in mouth, or a toothache) °UNUSUAL RASH, SWELLING OR PAIN  °UNUSUAL VAGINAL DISCHARGE OR ITCHING  ° °Items with * indicate a potential emergency and should be followed up as soon as possible or go to the Emergency Department if any problems should occur. ° °Please show the CHEMOTHERAPY ALERT CARD or IMMUNOTHERAPY ALERT CARD at check-in  to the Emergency Department and triage nurse. ° °Should you have questions after your visit or need to cancel or reschedule your appointment, please contact [facility]ICAL [facility]COL[facility]  Dept: [phone]  and follow the prompts.  Office hours are 8:00 a.m. to 4:30 p.m. Monday - Friday. Please note that voicemails left after 4:00 p.m. may not be returned until the following business day.  We are closed weekends and major holidays. You have access to a nurse at all times for urgent questions. Please call the main number to the clinic Dept: [phone] and follow the prompts. ° ° °For any non-urgent questions, you may also contact your provider using MyChart. We now offer e-Visits for anyone 18 and older to request care online for non-urgent symptoms. For details visit [email or web address]. °  °Also download the MyChart app! Go to the app store, search "MyChart", open the app, select [facility], and log in with your MyChart username and password. ° °Due to Covid, a mask is required upon entering the hospital/clinic. If you do not have a mask, one will be given to you upon arrival. For doctor visits, patients may have 1 support person aged 18 or older with them. For treatment visits, patients cannot have anyone with them due to current Covid guidelines and our immunocompromised population.  ° °

## 2021-07-29 LAB — KAPPA/LAMBDA LIGHT CHAINS
Kappa free light chain: 33.4 mg/L — ABNORMAL HIGH (ref 3.3–19.4)
Kappa, lambda light chain ratio: 0.25 — ABNORMAL LOW (ref 0.26–1.65)
Lambda free light chains: 132.2 mg/L — ABNORMAL HIGH (ref 5.7–26.3)

## 2021-08-01 LAB — MULTIPLE MYELOMA PANEL, SERUM
Albumin SerPl Elph-Mcnc: 3.3 g/dL (ref 2.9–4.4)
Albumin/Glob SerPl: 1.3 (ref 0.7–1.7)
Alpha 1: 0.2 g/dL (ref 0.0–0.4)
Alpha2 Glob SerPl Elph-Mcnc: 0.8 g/dL (ref 0.4–1.0)
B-Globulin SerPl Elph-Mcnc: 1 g/dL (ref 0.7–1.3)
Gamma Glob SerPl Elph-Mcnc: 0.8 g/dL (ref 0.4–1.8)
Globulin, Total: 2.7 g/dL (ref 2.2–3.9)
IgA: 494 mg/dL — ABNORMAL HIGH (ref 61–437)
IgG (Immunoglobin G), Serum: 651 mg/dL (ref 603–1613)
IgM (Immunoglobulin M), Srm: 53 mg/dL (ref 15–143)
M Protein SerPl Elph-Mcnc: 0.4 g/dL — ABNORMAL HIGH
Total Protein ELP: 6 g/dL (ref 6.0–8.5)

## 2021-08-02 ENCOUNTER — Other Ambulatory Visit: Payer: Self-pay

## 2021-08-02 ENCOUNTER — Ambulatory Visit (INDEPENDENT_AMBULATORY_CARE_PROVIDER_SITE_OTHER): Payer: Medicare HMO | Admitting: Podiatry

## 2021-08-02 DIAGNOSIS — L84 Corns and callosities: Secondary | ICD-10-CM

## 2021-08-02 DIAGNOSIS — B351 Tinea unguium: Secondary | ICD-10-CM | POA: Diagnosis not present

## 2021-08-02 DIAGNOSIS — Q828 Other specified congenital malformations of skin: Secondary | ICD-10-CM

## 2021-08-02 DIAGNOSIS — M79675 Pain in left toe(s): Secondary | ICD-10-CM | POA: Diagnosis not present

## 2021-08-02 DIAGNOSIS — E1142 Type 2 diabetes mellitus with diabetic polyneuropathy: Secondary | ICD-10-CM

## 2021-08-02 DIAGNOSIS — M2012 Hallux valgus (acquired), left foot: Secondary | ICD-10-CM | POA: Diagnosis not present

## 2021-08-02 DIAGNOSIS — M2011 Hallux valgus (acquired), right foot: Secondary | ICD-10-CM | POA: Diagnosis not present

## 2021-08-02 DIAGNOSIS — M79674 Pain in right toe(s): Secondary | ICD-10-CM | POA: Diagnosis not present

## 2021-08-02 DIAGNOSIS — E119 Type 2 diabetes mellitus without complications: Secondary | ICD-10-CM

## 2021-08-02 NOTE — Progress Notes (Signed)
ANNUAL DIABETIC FOOT EXAM  Subjective: Robert Sparks presents today for for annual diabetic foot examination.  Patient relates 20 year h/o diabetes.  Patient denies any h/o foot wounds.  Patient relates occasional symptoms of foot numbness.  Patient has been diagnosed with neuropathy and it is managed with gabapentin.  Patient's blood sugar was 111 mg/dl today.  Risk factors: diabetes, diabetic neuropathy, CKD, hyperlipidemia, HTN.  [name], MD is patient's PCP. Last visit was 01/04/2021.  Past Medical History:  Diagnosis Date   Anemia    Arthritis    shoulders,feet    Cataract    per eye dr -has appt 5-11   CKD (chronic kidney disease), stage III (HCC)    Elevated PSA    History of ketoacidosis    03-29-2015    History of sepsis    07-25-2013  non-compliant w/ medication   Hyperlipidemia    Hypertension    Nocturia    Peripheral neuropathy    Prostate cancer ([place])    Type 2 diabetes mellitus with insulin therapy [facility])    Patient Active Problem List   Diagnosis Date Noted   Multiple myeloma in relapse ([place]) 06/24/2021   Counseling regarding advance care planning and goals of care 06/24/2021   Microalbuminuria 04/27/2019   Memory changes 04/25/2019   Hyperlipidemia associated with type 2 diabetes mellitus ([place]) 04/25/2019   Dementia associated with other underlying disease without behavioral disturbance ([place]) 03/14/2019   Chronic bilateral low back pain without sciatica 03/14/2019   Spinal stenosis, lumbar region with neurogenic claudication 03/14/2019   Myofascial muscle pain 03/14/2019   History of prostate cancer 12/20/2018   Neuropathy of right hand 12/20/2018   HCAP (healthcare-associated pneumonia) 05/02/2018   Cough    Primary osteoarthritis of right knee 01/07/2018   Diabetic polyneuropathy associated with type 2 diabetes mellitus ([place]) 01/07/2018   Vitamin B12 deficiency 09/04/2017   Pre-ulcerative corn or callous 04/12/2017   Cataract  of both eyes 04/12/2017   Tobacco dependence 04/12/2017   Hypokalemia 03/23/2017   Multiple myeloma in remission ([place]) 02/23/2017   Oral leukoplakia 02/09/2017   Lightheadedness 11/10/2016   Hyperlipidemia 06/21/2016   CKD (chronic kidney disease) stage 3, GFR 30-59 ml/min (HCC) 02/23/2016   Malignant neoplasm of prostate ([place]) 02/21/2016   Controlled type 2 diabetes mellitus with stage 3 chronic kidney disease, with long-term current use of insulin ([place]) 03/29/2015   HTN (hypertension) 07/28/2013   Anemia, chronic disease 07/26/2013   Sepsis ([place]) 07/25/2013   ETOH abuse 07/25/2013   Past Surgical History:  Procedure Laterality Date   CIRCUMCISION  1990's   PROSTATE BIOPSY N/A 12/21/2015   Procedure: BIOPSY TRANSRECTAL ULTRASONIC PROSTATE (TUBP);  Surgeon: Festus Aloe, MD;  Location: [facility];  Service: Urology;  Laterality: N/A;   Current Outpatient Medications on File Prior to Visit  Medication Sig Dispense Refill   Accu-Chek Softclix Lancets lancets Use to test blood sugar 3 times daily. 100 each 3   acyclovir (ZOVIRAX) 400 MG tablet TAKE 1 TABLET (400 MG TOTAL) BY MOUTH 2 (TWO) TIMES DAILY. 60 tablet 5   acyclovir (ZOVIRAX) 400 MG tablet Take 1 tablet (400 mg total) by mouth 2 (two) times daily. 60 tablet 11   amLODipine (NORVASC) 10 MG tablet TAKE 1 TABLET (10 MG TOTAL) BY MOUTH DAILY. 30 tablet 0   aspirin EC 81 MG tablet Take 1 tablet (81 mg total) by mouth daily. 90 tablet 3   b complex vitamins capsule Take 1 capsule  by mouth daily. 30 capsule 5   Blood Glucose Monitoring Suppl (ACCU-CHEK GUIDE) w/Device KIT Use to test blood sugar three times daily 1 kit 0   buPROPion (WELLBUTRIN SR) 150 MG 12 hr tablet      Continuous Blood Gluc Sensor (FREESTYLE LIBRE SENSOR SYSTEM) MISC Change sensor Q 2 wks 2 each 12   dexamethasone (DECADRON) 4 MG tablet Take 5 tablets (20 mg total) by mouth daily. Take the day after daratumumab. Take with breakfast 20 tablet 11    diclofenac Sodium (VOLTAREN) 1 % GEL Apply 4 g topically 4 (four) times daily. 150 g 5   donepezil (ARICEPT) 5 MG tablet TAKE 1 TABLET (5 MG TOTAL) BY MOUTH AT BEDTIME. FOR MEMORY ISSUES 30 tablet 2   ferrous sulfate 325 (65 FE) MG tablet Take 325 mg by mouth daily.     gabapentin (NEURONTIN) 300 MG capsule TAKE 1 CAPSULE BY MOUTH IN THE MORNING AND LUNCH, AND 2 AT BEDTIME 120 capsule 6   glucose blood (ACCU-CHEK GUIDE) test strip Use to test blood sugar 3 times daily. 100 each 2   hydrOXYzine (ATARAX/VISTARIL) 25 MG tablet TAKE 1 TABLET (25 MG TOTAL) BY MOUTH 3 (THREE) TIMES DAILY AS NEEDED FOR ITCHING (RASH). 30 tablet 0   Insulin Lispro Prot & Lispro (HUMALOG MIX 75/25 KWIKPEN) (75-25) 100 UNIT/ML Kwikpen Inject 6 Units into the skin in the morning and at bedtime. 15 mL 2   Insulin Pen Needle (BD PEN NEEDLE NANO U/F) 32G X 4 MM MISC USE AS DIRECTED TWICE DAILY WITH INSULIN 100 each 6   ixazomib citrate (NINLARO) 3 MG capsule Take 1 capsule (3 mg) by mouth weekly, 3 weeks on, 1 week off, repeat every 4 weeks. Take on an empty stomach 1hr before or 2hr after meals. 3 capsule 3   lidocaine (LIDODERM) 5 %      Miconazole Nitrate (ATHLETES FOOT POWDER SPRAY) 2 % AERP Spray between toes and under toes once daily. 130 g 4   Multiple Vitamin (MULTIVITAMIN WITH MINERALS) TABS tablet Take 1 tablet by mouth daily. 60 tablet 2   ondansetron (ZOFRAN) 8 MG tablet Take 1 tablet (8 mg total) by mouth 2 (two) times daily as needed (Nausea or vomiting). 30 tablet 1   pravastatin (PRAVACHOL) 20 MG tablet TAKE 1 TABLET (20 MG TOTAL) BY MOUTH DAILY. 30 tablet 0   prochlorperazine (COMPAZINE) 10 MG tablet Take 1 tablet (10 mg total) by mouth every 6 (six) hours as needed (Nausea or vomiting). 30 tablet 1   tadalafil (CIALIS) 20 MG tablet Take 20 mg by mouth as needed.     tiZANidine (ZANAFLEX) 2 MG tablet Take 1 tablet (2 mg total) by mouth at bedtime as needed for muscle spasms. 30 tablet 1   vitamin B-12  (CYANOCOBALAMIN) 100 MCG tablet Take 100 mcg by mouth daily.     Vitamins/Minerals TABS Take by mouth.     No current facility-administered medications on file prior to visit.    Allergies  Allergen Reactions   Other Other (See Comments)    Nicoderm CQ = Bad Dreams Nicotine gum = Bad Dreams    Social History   Occupational History   Not on file  Tobacco Use   Smoking status: Every Day    Packs/day: 0.50    Years: 50.00    Pack years: 25.00    Types: Cigarettes   Smokeless tobacco: Never   Tobacco comments:    1 ppwk-4-5 a day   Vaping  Use   Vaping Use: Never used  Substance and Sexual Activity   Alcohol use: Yes    Alcohol/week: 1.0 standard drink    Types: 1 Cans of beer per week    Comment: 1 every day-quit couple weeks ago   Drug use: No   Sexual activity: Not Currently   Family History  Problem Relation Age of Onset   Kidney disease Mother    Cancer Neg Hx    Colon cancer Neg Hx    Colon polyps Neg Hx    Esophageal cancer Neg Hx    Rectal cancer Neg Hx    Stomach cancer Neg Hx    Immunization History  Administered Date(s) Administered   Influenza, High Dose Seasonal PF 04/08/2018, 04/17/2019   Influenza,inj,Quad PF,6+ Mos 02/23/2016   Influenza-Unspecified 04/28/2020   PFIZER(Purple Top)SARS-COV-2 Vaccination 09/05/2019, 10/01/2019   Pneumococcal Conjugate-13 03/27/2018, 06/20/2018   Pneumococcal Polysaccharide-23 02/23/2016   Tdap 06/21/2016     Review of Systems: Negative except as noted in the HPI.   Objective: There were no vitals filed for this visit.  Javaun Dimperio Trusty is a pleasant 76 y.o. male in NAD. AAO X 3.  Vascular Examination: CFT immediate b/l LE. Palpable DP/PT pulses b/l LE. Digital hair present b/l. Skin temperature gradient WNL b/l. No pain with calf compression b/l. No edema noted b/l. No cyanosis or clubbing noted b/l LE.  Dermatological Examination: Pedal skin warm and supple b/l.  No open wounds b/l. No interdigital  macerations. Toenails 1-5 b/l elongated, thickened, discolored with subungual debris. +Tenderness with dorsal palpation of nailplates. Hyperkeratotic lesion(s) noted 1st metatarsal head left foot.  Porokeratotic lesion(s) noted R hallux.  Musculoskeletal Examination: Normal muscle strength 5/5 to all lower extremity muscle groups bilaterally. HAV with bunion deformity noted b/l LE.[name] No pain, crepitus or joint limitation noted with ROM b/l LE.  Patient ambulates independently without assistive aids.  Footwear Assessment: Does the patient wear appropriate shoes? Yes. Does the patient need inserts/orthotics? No.  Neurological Examination: Protective sensation diminished with 10g monofilament b/l. Vibratory sensation intact b/l.  Hemoglobin A1C Latest Ref Rng & Units 01/04/2021  HGBA1C 0.0 - 7.0 % 7.2(A)  Some recent data might be hidden   Assessment: 1. Pain due to onychomycosis of toenails of both feet   2. Callus   3. Porokeratosis   4. Hallux valgus, acquired, bilateral   5. Diabetic peripheral neuropathy associated with type 2 diabetes mellitus ([place])   6. Encounter for diabetic foot exam ([place])     High Risk  Patient has one or more of the following: Loss of protective sensation Absent pedal pulses Severe Foot deformity History of foot ulcer  Plan: -Diabetic foot examination performed today. -Continue foot and shoe inspections daily. Monitor blood glucose per PCP/Endocrinologist's recommendations. -Mycotic toenails 1-5 bilaterally were debrided in length and girth with sterile nail nippers and dremel without incident. -Callus(es) 1st metatarsal head left foot pared utilizing sterile scalpel blade without complication or incident. Total number debrided =1. -Painful porokeratotic lesion(s) R hallux pared and enucleated with sterile scalpel blade without incident. Total number of lesions debrided=1. -Patient/POA to call should there be question/concern in the interim.  Return in  about 3 months (around 10/31/2021).  Marzetta Board, DPM

## 2021-08-04 ENCOUNTER — Other Ambulatory Visit: Payer: Self-pay

## 2021-08-04 ENCOUNTER — Inpatient Hospital Stay: Payer: Medicare HMO

## 2021-08-04 VITALS — BP 133/69 | HR 74 | Temp 98.7°F | Resp 18 | Wt 166.5 lb

## 2021-08-04 DIAGNOSIS — E1122 Type 2 diabetes mellitus with diabetic chronic kidney disease: Secondary | ICD-10-CM | POA: Diagnosis not present

## 2021-08-04 DIAGNOSIS — C9001 Multiple myeloma in remission: Secondary | ICD-10-CM

## 2021-08-04 DIAGNOSIS — I129 Hypertensive chronic kidney disease with stage 1 through stage 4 chronic kidney disease, or unspecified chronic kidney disease: Secondary | ICD-10-CM | POA: Diagnosis not present

## 2021-08-04 DIAGNOSIS — N189 Chronic kidney disease, unspecified: Secondary | ICD-10-CM | POA: Diagnosis not present

## 2021-08-04 DIAGNOSIS — Z5112 Encounter for antineoplastic immunotherapy: Secondary | ICD-10-CM | POA: Diagnosis not present

## 2021-08-04 DIAGNOSIS — C9002 Multiple myeloma in relapse: Secondary | ICD-10-CM

## 2021-08-04 DIAGNOSIS — Z7189 Other specified counseling: Secondary | ICD-10-CM

## 2021-08-04 LAB — CBC WITH DIFFERENTIAL (CANCER CENTER ONLY)
Abs Immature Granulocytes: 0.02 10*3/uL (ref 0.00–0.07)
Basophils Absolute: 0 10*3/uL (ref 0.0–0.1)
Basophils Relative: 0 %
Eosinophils Absolute: 0.3 10*3/uL (ref 0.0–0.5)
Eosinophils Relative: 5 %
HCT: 31.7 % — ABNORMAL LOW (ref 39.0–52.0)
Hemoglobin: 10.9 g/dL — ABNORMAL LOW (ref 13.0–17.0)
Immature Granulocytes: 0 %
Lymphocytes Relative: 16 %
Lymphs Abs: 1 10*3/uL (ref 0.7–4.0)
MCH: 34.2 pg — ABNORMAL HIGH (ref 26.0–34.0)
MCHC: 34.4 g/dL (ref 30.0–36.0)
MCV: 99.4 fL (ref 80.0–100.0)
Monocytes Absolute: 0.5 10*3/uL (ref 0.1–1.0)
Monocytes Relative: 8 %
Neutro Abs: 4.3 10*3/uL (ref 1.7–7.7)
Neutrophils Relative %: 71 %
Platelet Count: 190 10*3/uL (ref 150–400)
RBC: 3.19 MIL/uL — ABNORMAL LOW (ref 4.22–5.81)
RDW: 13.4 % (ref 11.5–15.5)
WBC Count: 6.1 10*3/uL (ref 4.0–10.5)
nRBC: 0 % (ref 0.0–0.2)

## 2021-08-04 LAB — CMP (CANCER CENTER ONLY)
ALT: 32 U/L (ref 0–44)
AST: 23 U/L (ref 15–41)
Albumin: 3.8 g/dL (ref 3.5–5.0)
Alkaline Phosphatase: 62 U/L (ref 38–126)
Anion gap: 6 (ref 5–15)
BUN: 13 mg/dL (ref 8–23)
CO2: 28 mmol/L (ref 22–32)
Calcium: 9.1 mg/dL (ref 8.9–10.3)
Chloride: 103 mmol/L (ref 98–111)
Creatinine: 1.26 mg/dL — ABNORMAL HIGH (ref 0.61–1.24)
GFR, Estimated: >60 mL/min (ref >60)
Glucose, Bld: 137 mg/dL — ABNORMAL HIGH (ref 70–99)
Potassium: 3.9 mmol/L (ref 3.5–5.1)
Sodium: 137 mmol/L (ref 135–145)
Total Bilirubin: 0.7 mg/dL (ref 0.3–1.2)
Total Protein: 6.7 g/dL (ref 6.5–8.1)

## 2021-08-04 MED ORDER — DIPHENHYDRAMINE HCL 25 MG PO CAPS
50.0000 mg | ORAL_CAPSULE | Freq: Once | ORAL | Status: AC
  Administered 2021-08-04: 50 mg via ORAL
  Filled 2021-08-04: qty 2

## 2021-08-04 MED ORDER — SODIUM CHLORIDE 0.9 % IV SOLN: Freq: Once | INTRAVENOUS | Status: AC

## 2021-08-04 MED ORDER — ACETAMINOPHEN 325 MG PO TABS
650.0000 mg | ORAL_TABLET | Freq: Once | ORAL | Status: AC
  Administered 2021-08-04: 650 mg via ORAL
  Filled 2021-08-04: qty 2

## 2021-08-04 MED ORDER — SODIUM CHLORIDE 0.9 % IV SOLN
16.0000 mg/kg | Freq: Once | INTRAVENOUS | Status: AC
  Administered 2021-08-04: 1200 mg via INTRAVENOUS
  Filled 2021-08-04: qty 60

## 2021-08-04 MED ORDER — SODIUM CHLORIDE 0.9 % IV SOLN
20.0000 mg | Freq: Once | INTRAVENOUS | Status: AC
  Administered 2021-08-04: 20 mg via INTRAVENOUS
  Filled 2021-08-04: qty 20

## 2021-08-04 MED ORDER — FAMOTIDINE 20 MG IN NS 100 ML IVPB
20.0000 mg | Freq: Once | INTRAVENOUS | Status: AC
  Administered 2021-08-04: 20 mg via INTRAVENOUS
  Filled 2021-08-04: qty 100

## 2021-08-04 NOTE — Patient Instructions (Signed)
[facility]COL[facility]  Discharge Instructions: °Thank you for choosing [facility] to provide your oncology and hematology care.  ° °If you have a lab appointment with the Cancer Center, please go directly to the Cancer Center and check in at the registration area. °  °Wear comfortable clothing and clothing appropriate for easy access to any Portacath or PICC line.  ° °We strive to give you quality time with your provider. You may need to reschedule your appointment if you arrive late (15 or more minutes).  Arriving late affects you and other patients whose appointments are after yours.  Also, if you miss three or more appointments without notifying the office, you may be dismissed from the clinic at the provider’s discretion.    °  °For prescription refill requests, have your pharmacy contact our office and allow 72 hours for refills to be completed.   ° °Today you received the following chemotherapy and/or immunotherapy agents: Darzalex  °  °To help prevent nausea and vomiting after your treatment, we encourage you to take your nausea medication as directed. ° °BELOW ARE SYMPTOMS THAT SHOULD BE REPORTED IMMEDIATELY: °*FEVER GREATER THAN 100.4 F (38 °C) OR HIGHER °*CHILLS OR SWEATING °*NAUSEA AND VOMITING THAT IS NOT CONTROLLED WITH YOUR NAUSEA MEDICATION °*UNUSUAL SHORTNESS OF BREATH °*UNUSUAL BRUISING OR BLEEDING °*URINARY PROBLEMS (pain or burning when urinating, or frequent urination) °*BOWEL PROBLEMS (unusual diarrhea, constipation, pain near the anus) °TENDERNESS IN MOUTH AND THROAT WITH OR WITHOUT PRESENCE OF ULCERS (sore throat, sores in mouth, or a toothache) °UNUSUAL RASH, SWELLING OR PAIN  °UNUSUAL VAGINAL DISCHARGE OR ITCHING  ° °Items with * indicate a potential emergency and should be followed up as soon as possible or go to the Emergency Department if any problems should occur. ° °Please show the CHEMOTHERAPY ALERT CARD or IMMUNOTHERAPY ALERT CARD at check-in to the  Emergency Department and triage nurse. ° °Should you have questions after your visit or need to cancel or reschedule your appointment, please contact [facility]  Dept: [phone]  and follow the prompts.  Office hours are 8:00 a.m. to 4:30 p.m. Monday - Friday. Please note that voicemails left after 4:00 p.m. may not be returned until the following business day.  We are closed weekends and major holidays. You have access to a nurse at all times for urgent questions. Please call the main number to the clinic Dept: [phone] and follow the prompts. ° ° °For any non-urgent questions, you may also contact your provider using MyChart. We now offer e-Visits for anyone 18 and older to request care online for non-urgent symptoms. For details visit [email or web address]. °  °Also download the MyChart app! Go to the app store, search "MyChart", open the app, select [place], and log in with your MyChart username and password. ° °Due to Covid, a mask is required upon entering the hospital/clinic. If you do not have a mask, one will be given to you upon arrival. For doctor visits, patients may have 1 support person aged 18 or older with them. For treatment visits, patients cannot have anyone with them due to current Covid guidelines and our immunocompromised population.  ° °

## 2021-08-05 LAB — KAPPA/LAMBDA LIGHT CHAINS
Kappa free light chain: 34 mg/L — ABNORMAL HIGH (ref 3.3–19.4)
Kappa, lambda light chain ratio: 0.25 — ABNORMAL LOW (ref 0.26–1.65)
Lambda free light chains: 136.3 mg/L — ABNORMAL HIGH (ref 5.7–26.3)

## 2021-08-09 ENCOUNTER — Other Ambulatory Visit: Payer: Self-pay

## 2021-08-09 LAB — MULTIPLE MYELOMA PANEL, SERUM
Albumin SerPl Elph-Mcnc: 3.7 g/dL (ref 2.9–4.4)
Albumin/Glob SerPl: 1.7 (ref 0.7–1.7)
Alpha 1: 0.2 g/dL (ref 0.0–0.4)
Alpha2 Glob SerPl Elph-Mcnc: 0.7 g/dL (ref 0.4–1.0)
B-Globulin SerPl Elph-Mcnc: 1 g/dL (ref 0.7–1.3)
Gamma Glob SerPl Elph-Mcnc: 0.4 g/dL (ref 0.4–1.8)
Globulin, Total: 2.3 g/dL (ref 2.2–3.9)
IgA: 415 mg/dL (ref 61–437)
IgG (Immunoglobin G), Serum: 606 mg/dL (ref 603–1613)
IgM (Immunoglobulin M), Srm: 43 mg/dL (ref 15–143)
M Protein SerPl Elph-Mcnc: 0.5 g/dL — ABNORMAL HIGH
Total Protein ELP: 6 g/dL (ref 6.0–8.5)

## 2021-08-10 ENCOUNTER — Other Ambulatory Visit: Payer: Self-pay

## 2021-08-10 ENCOUNTER — Other Ambulatory Visit: Payer: Self-pay | Admitting: *Deleted

## 2021-08-10 DIAGNOSIS — C9002 Multiple myeloma in relapse: Secondary | ICD-10-CM

## 2021-08-11 ENCOUNTER — Inpatient Hospital Stay: Payer: Medicare HMO

## 2021-08-11 ENCOUNTER — Other Ambulatory Visit: Payer: Self-pay

## 2021-08-11 ENCOUNTER — Inpatient Hospital Stay: Payer: Medicare HMO | Attending: Hematology

## 2021-08-11 ENCOUNTER — Other Ambulatory Visit: Payer: Self-pay | Admitting: Hematology

## 2021-08-11 ENCOUNTER — Other Ambulatory Visit: Payer: Self-pay | Admitting: Hematology and Oncology

## 2021-08-11 VITALS — BP 150/75 | HR 74 | Temp 98.2°F | Resp 18 | Wt 167.2 lb

## 2021-08-11 DIAGNOSIS — C9002 Multiple myeloma in relapse: Secondary | ICD-10-CM

## 2021-08-11 DIAGNOSIS — N189 Chronic kidney disease, unspecified: Secondary | ICD-10-CM | POA: Diagnosis not present

## 2021-08-11 DIAGNOSIS — G620 Drug-induced polyneuropathy: Secondary | ICD-10-CM | POA: Diagnosis not present

## 2021-08-11 DIAGNOSIS — E1122 Type 2 diabetes mellitus with diabetic chronic kidney disease: Secondary | ICD-10-CM | POA: Diagnosis not present

## 2021-08-11 DIAGNOSIS — Z5112 Encounter for antineoplastic immunotherapy: Secondary | ICD-10-CM | POA: Diagnosis not present

## 2021-08-11 DIAGNOSIS — I129 Hypertensive chronic kidney disease with stage 1 through stage 4 chronic kidney disease, or unspecified chronic kidney disease: Secondary | ICD-10-CM | POA: Diagnosis not present

## 2021-08-11 DIAGNOSIS — E1142 Type 2 diabetes mellitus with diabetic polyneuropathy: Secondary | ICD-10-CM | POA: Diagnosis not present

## 2021-08-11 DIAGNOSIS — T451X5A Adverse effect of antineoplastic and immunosuppressive drugs, initial encounter: Secondary | ICD-10-CM | POA: Insufficient documentation

## 2021-08-11 DIAGNOSIS — Z7189 Other specified counseling: Secondary | ICD-10-CM

## 2021-08-11 LAB — CBC WITH DIFFERENTIAL (CANCER CENTER ONLY)
Abs Immature Granulocytes: 0.02 10*3/uL (ref 0.00–0.07)
Basophils Absolute: 0 10*3/uL (ref 0.0–0.1)
Basophils Relative: 0 %
Eosinophils Absolute: 0.2 10*3/uL (ref 0.0–0.5)
Eosinophils Relative: 4 %
HCT: 32.2 % — ABNORMAL LOW (ref 39.0–52.0)
Hemoglobin: 11.2 g/dL — ABNORMAL LOW (ref 13.0–17.0)
Immature Granulocytes: 0 %
Lymphocytes Relative: 20 %
Lymphs Abs: 1 10*3/uL (ref 0.7–4.0)
MCH: 34.3 pg — ABNORMAL HIGH (ref 26.0–34.0)
MCHC: 34.8 g/dL (ref 30.0–36.0)
MCV: 98.5 fL (ref 80.0–100.0)
Monocytes Absolute: 0.4 10*3/uL (ref 0.1–1.0)
Monocytes Relative: 8 %
Neutro Abs: 3.2 10*3/uL (ref 1.7–7.7)
Neutrophils Relative %: 68 %
Platelet Count: 182 10*3/uL (ref 150–400)
RBC: 3.27 MIL/uL — ABNORMAL LOW (ref 4.22–5.81)
RDW: 13.1 % (ref 11.5–15.5)
WBC Count: 4.8 10*3/uL (ref 4.0–10.5)
nRBC: 0 % (ref 0.0–0.2)

## 2021-08-11 LAB — CMP (CANCER CENTER ONLY)
ALT: 22 U/L (ref 0–44)
AST: 16 U/L (ref 15–41)
Albumin: 3.7 g/dL (ref 3.5–5.0)
Alkaline Phosphatase: 98 U/L (ref 38–126)
Anion gap: 7 (ref 5–15)
BUN: 19 mg/dL (ref 8–23)
CO2: 25 mmol/L (ref 22–32)
Calcium: 8.8 mg/dL — ABNORMAL LOW (ref 8.9–10.3)
Chloride: 104 mmol/L (ref 98–111)
Creatinine: 1.33 mg/dL — ABNORMAL HIGH (ref 0.61–1.24)
GFR, Estimated: 56 mL/min — ABNORMAL LOW (ref >60)
Glucose, Bld: 236 mg/dL — ABNORMAL HIGH (ref 70–99)
Potassium: 3.3 mmol/L — ABNORMAL LOW (ref 3.5–5.1)
Sodium: 136 mmol/L (ref 135–145)
Total Bilirubin: 0.7 mg/dL (ref 0.3–1.2)
Total Protein: 6.5 g/dL (ref 6.5–8.1)

## 2021-08-11 MED ORDER — SODIUM CHLORIDE 0.9 % IV SOLN
20.0000 mg | Freq: Once | INTRAVENOUS | Status: AC
  Administered 2021-08-11: 20 mg via INTRAVENOUS
  Filled 2021-08-11: qty 20

## 2021-08-11 MED ORDER — DIPHENHYDRAMINE HCL 25 MG PO CAPS
50.0000 mg | ORAL_CAPSULE | Freq: Once | ORAL | Status: AC
  Administered 2021-08-11: 50 mg via ORAL
  Filled 2021-08-11: qty 2

## 2021-08-11 MED ORDER — FAMOTIDINE IN NACL 20-0.9 MG/50ML-% IV SOLN
20.0000 mg | Freq: Once | INTRAVENOUS | Status: AC
  Administered 2021-08-11: 20 mg via INTRAVENOUS
  Filled 2021-08-11: qty 50

## 2021-08-11 MED ORDER — SODIUM CHLORIDE 0.9 % IV SOLN
16.0000 mg/kg | Freq: Once | INTRAVENOUS | Status: AC
  Administered 2021-08-11: 1200 mg via INTRAVENOUS
  Filled 2021-08-11: qty 60

## 2021-08-11 MED ORDER — ACETAMINOPHEN 325 MG PO TABS
650.0000 mg | ORAL_TABLET | Freq: Once | ORAL | Status: AC
  Administered 2021-08-11: 650 mg via ORAL
  Filled 2021-08-11: qty 2

## 2021-08-11 MED ORDER — SODIUM CHLORIDE 0.9 % IV SOLN: Freq: Once | INTRAVENOUS | Status: AC

## 2021-08-11 NOTE — Patient Instructions (Signed)
[facility]COL[facility]  Discharge Instructions: °Thank you for choosing [facility] to provide your oncology and hematology care.  ° °If you have a lab appointment with the Cancer Center, please go directly to the Cancer Center and check in at the registration area. °  °Wear comfortable clothing and clothing appropriate for easy access to any Portacath or PICC line.  ° °We strive to give you quality time with your provider. You may need to reschedule your appointment if you arrive late (15 or more minutes).  Arriving late affects you and other patients whose appointments are after yours.  Also, if you miss three or more appointments without notifying the office, you may be dismissed from the clinic at the provider’s discretion.    °  °For prescription refill requests, have your pharmacy contact our office and allow 72 hours for refills to be completed.   ° °Today you received the following chemotherapy and/or immunotherapy agents: Daratumumab.     °  °To help prevent nausea and vomiting after your treatment, we encourage you to take your nausea medication as directed. ° °BELOW ARE SYMPTOMS THAT SHOULD BE REPORTED IMMEDIATELY: °*FEVER GREATER THAN 100.4 F (38 °C) OR HIGHER °*CHILLS OR SWEATING °*NAUSEA AND VOMITING THAT IS NOT CONTROLLED WITH YOUR NAUSEA MEDICATION °*UNUSUAL SHORTNESS OF BREATH °*UNUSUAL BRUISING OR BLEEDING °*URINARY PROBLEMS (pain or burning when urinating, or frequent urination) °*BOWEL PROBLEMS (unusual diarrhea, constipation, pain near the anus) °TENDERNESS IN MOUTH AND THROAT WITH OR WITHOUT PRESENCE OF ULCERS (sore throat, sores in mouth, or a toothache) °UNUSUAL RASH, SWELLING OR PAIN  °UNUSUAL VAGINAL DISCHARGE OR ITCHING  ° °Items with * indicate a potential emergency and should be followed up as soon as possible or go to the Emergency Department if any problems should occur. ° °Please show the CHEMOTHERAPY ALERT CARD or IMMUNOTHERAPY ALERT CARD at check-in  to the Emergency Department and triage nurse. ° °Should you have questions after your visit or need to cancel or reschedule your appointment, please contact [facility]  Dept: [phone]  and follow the prompts.  Office hours are 8:00 a.m. to 4:30 p.m. Monday - Friday. Please note that voicemails left after 4:00 p.m. may not be returned until the following business day.  We are closed weekends and major holidays. You have access to a nurse at all times for urgent questions. Please call the main number to the clinic Dept: [phone] and follow the prompts. ° ° °For any non-urgent questions, you may also contact your provider using MyChart. We now offer e-Visits for anyone 18 and older to request care online for non-urgent symptoms. For details visit [email or web address]. °  °Also download the MyChart app! Go to the app store, search "MyChart", open the app, select [place], and log in with your MyChart username and password. ° °Due to Covid, a mask is required upon entering the hospital/clinic. If you do not have a mask, one will be given to you upon arrival. For doctor visits, patients may have 1 support person aged 18 or older with them. For treatment visits, patients cannot have anyone with them due to current Covid guidelines and our immunocompromised population.  ° °

## 2021-08-15 ENCOUNTER — Encounter: Payer: Self-pay | Admitting: Hematology

## 2021-08-15 ENCOUNTER — Other Ambulatory Visit: Payer: Self-pay

## 2021-08-16 ENCOUNTER — Other Ambulatory Visit: Payer: Self-pay

## 2021-08-18 ENCOUNTER — Other Ambulatory Visit: Payer: Self-pay | Admitting: *Deleted

## 2021-08-18 DIAGNOSIS — C9002 Multiple myeloma in relapse: Secondary | ICD-10-CM

## 2021-08-18 MED FILL — Dexamethasone Sodium Phosphate Inj 100 MG/10ML: INTRAMUSCULAR | Qty: 2 | Status: AC

## 2021-08-19 ENCOUNTER — Inpatient Hospital Stay: Payer: Medicare HMO

## 2021-08-19 ENCOUNTER — Inpatient Hospital Stay (HOSPITAL_BASED_OUTPATIENT_CLINIC_OR_DEPARTMENT_OTHER): Payer: Medicare HMO | Admitting: Hematology

## 2021-08-19 ENCOUNTER — Other Ambulatory Visit: Payer: Self-pay

## 2021-08-19 VITALS — BP 128/70 | HR 88 | Temp 97.3°F | Resp 18 | Wt 161.1 lb

## 2021-08-19 VITALS — BP 132/75 | HR 79 | Temp 98.2°F | Resp 17

## 2021-08-19 DIAGNOSIS — G620 Drug-induced polyneuropathy: Secondary | ICD-10-CM | POA: Diagnosis not present

## 2021-08-19 DIAGNOSIS — T451X5A Adverse effect of antineoplastic and immunosuppressive drugs, initial encounter: Secondary | ICD-10-CM | POA: Diagnosis not present

## 2021-08-19 DIAGNOSIS — Z5112 Encounter for antineoplastic immunotherapy: Secondary | ICD-10-CM | POA: Diagnosis not present

## 2021-08-19 DIAGNOSIS — Z7189 Other specified counseling: Secondary | ICD-10-CM

## 2021-08-19 DIAGNOSIS — Z5111 Encounter for antineoplastic chemotherapy: Secondary | ICD-10-CM

## 2021-08-19 DIAGNOSIS — E1142 Type 2 diabetes mellitus with diabetic polyneuropathy: Secondary | ICD-10-CM | POA: Diagnosis not present

## 2021-08-19 DIAGNOSIS — N189 Chronic kidney disease, unspecified: Secondary | ICD-10-CM | POA: Diagnosis not present

## 2021-08-19 DIAGNOSIS — I129 Hypertensive chronic kidney disease with stage 1 through stage 4 chronic kidney disease, or unspecified chronic kidney disease: Secondary | ICD-10-CM | POA: Diagnosis not present

## 2021-08-19 DIAGNOSIS — E1122 Type 2 diabetes mellitus with diabetic chronic kidney disease: Secondary | ICD-10-CM | POA: Diagnosis not present

## 2021-08-19 DIAGNOSIS — C9002 Multiple myeloma in relapse: Secondary | ICD-10-CM | POA: Diagnosis not present

## 2021-08-19 LAB — CBC WITH DIFFERENTIAL (CANCER CENTER ONLY)
Abs Immature Granulocytes: 0.02 10*3/uL (ref 0.00–0.07)
Basophils Absolute: 0 10*3/uL (ref 0.0–0.1)
Basophils Relative: 0 %
Eosinophils Absolute: 0.1 10*3/uL (ref 0.0–0.5)
Eosinophils Relative: 3 %
HCT: 35 % — ABNORMAL LOW (ref 39.0–52.0)
Hemoglobin: 12.3 g/dL — ABNORMAL LOW (ref 13.0–17.0)
Immature Granulocytes: 0 %
Lymphocytes Relative: 15 %
Lymphs Abs: 0.8 10*3/uL (ref 0.7–4.0)
MCH: 34.3 pg — ABNORMAL HIGH (ref 26.0–34.0)
MCHC: 35.1 g/dL (ref 30.0–36.0)
MCV: 97.5 fL (ref 80.0–100.0)
Monocytes Absolute: 0.5 10*3/uL (ref 0.1–1.0)
Monocytes Relative: 8 %
Neutro Abs: 4.2 10*3/uL (ref 1.7–7.7)
Neutrophils Relative %: 74 %
Platelet Count: 208 10*3/uL (ref 150–400)
RBC: 3.59 MIL/uL — ABNORMAL LOW (ref 4.22–5.81)
RDW: 13.5 % (ref 11.5–15.5)
WBC Count: 5.7 10*3/uL (ref 4.0–10.5)
nRBC: 0 % (ref 0.0–0.2)

## 2021-08-19 LAB — CMP (CANCER CENTER ONLY)
ALT: 37 U/L (ref 0–44)
AST: 37 U/L (ref 15–41)
Albumin: 4.1 g/dL (ref 3.5–5.0)
Alkaline Phosphatase: 82 U/L (ref 38–126)
Anion gap: 10 (ref 5–15)
BUN: 12 mg/dL (ref 8–23)
CO2: 24 mmol/L (ref 22–32)
Calcium: 9.4 mg/dL (ref 8.9–10.3)
Chloride: 103 mmol/L (ref 98–111)
Creatinine: 1.3 mg/dL — ABNORMAL HIGH (ref 0.61–1.24)
GFR, Estimated: 58 mL/min — ABNORMAL LOW (ref >60)
Glucose, Bld: 147 mg/dL — ABNORMAL HIGH (ref 70–99)
Potassium: 3.6 mmol/L (ref 3.5–5.1)
Sodium: 137 mmol/L (ref 135–145)
Total Bilirubin: 0.9 mg/dL (ref 0.3–1.2)
Total Protein: 6.9 g/dL (ref 6.5–8.1)

## 2021-08-19 MED ORDER — DIPHENHYDRAMINE HCL 25 MG PO CAPS
50.0000 mg | ORAL_CAPSULE | Freq: Once | ORAL | Status: AC
  Administered 2021-08-19: 50 mg via ORAL
  Filled 2021-08-19: qty 2

## 2021-08-19 MED ORDER — ACETAMINOPHEN 325 MG PO TABS
650.0000 mg | ORAL_TABLET | Freq: Once | ORAL | Status: AC
  Administered 2021-08-19: 650 mg via ORAL
  Filled 2021-08-19: qty 2

## 2021-08-19 MED ORDER — SODIUM CHLORIDE 0.9 % IV SOLN
20.0000 mg | Freq: Once | INTRAVENOUS | Status: AC
  Administered 2021-08-19: 20 mg via INTRAVENOUS
  Filled 2021-08-19: qty 20

## 2021-08-19 MED ORDER — FAMOTIDINE IN NACL 20-0.9 MG/50ML-% IV SOLN
20.0000 mg | Freq: Once | INTRAVENOUS | Status: AC
  Administered 2021-08-19: 20 mg via INTRAVENOUS
  Filled 2021-08-19: qty 50

## 2021-08-19 MED ORDER — SODIUM CHLORIDE 0.9 % IV SOLN: Freq: Once | INTRAVENOUS | Status: AC

## 2021-08-19 MED ORDER — SODIUM CHLORIDE 0.9 % IV SOLN
16.0000 mg/kg | Freq: Once | INTRAVENOUS | Status: AC
  Administered 2021-08-19: 1200 mg via INTRAVENOUS
  Filled 2021-08-19: qty 60

## 2021-08-19 NOTE — Patient Instructions (Signed)
[facility]  Discharge Instructions: °Thank you for choosing [facility] to provide your oncology and hematology care.  ° °If you have a lab appointment with the Cancer Center, please go directly to the Cancer Center and check in at the registration area. °  °Wear comfortable clothing and clothing appropriate for easy access to any Portacath or PICC line.  ° °We strive to give you quality time with your provider. You may need to reschedule your appointment if you arrive late (15 or more minutes).  Arriving late affects you and other patients whose appointments are after yours.  Also, if you miss three or more appointments without notifying the office, you may be dismissed from the clinic at the provider’s discretion.    °  °For prescription refill requests, have your pharmacy contact our office and allow 72 hours for refills to be completed.   ° °Today you received the following chemotherapy and/or immunotherapy agents: Darzalex  °  °To help prevent nausea and vomiting after your treatment, we encourage you to take your nausea medication as directed. ° °BELOW ARE SYMPTOMS THAT SHOULD BE REPORTED IMMEDIATELY: °*FEVER GREATER THAN 100.4 F (38 °C) OR HIGHER °*CHILLS OR SWEATING °*NAUSEA AND VOMITING THAT IS NOT CONTROLLED WITH YOUR NAUSEA MEDICATION °*UNUSUAL SHORTNESS OF BREATH °*UNUSUAL BRUISING OR BLEEDING °*URINARY PROBLEMS (pain or burning when urinating, or frequent urination) °*BOWEL PROBLEMS (unusual diarrhea, constipation, pain near the anus) °TENDERNESS IN MOUTH AND THROAT WITH OR WITHOUT PRESENCE OF ULCERS (sore throat, sores in mouth, or a toothache) °UNUSUAL RASH, SWELLING OR PAIN  °UNUSUAL VAGINAL DISCHARGE OR ITCHING  ° °Items with * indicate a potential emergency and should be followed up as soon as possible or go to the Emergency Department if any problems should occur. ° °Please show the CHEMOTHERAPY ALERT CARD or IMMUNOTHERAPY ALERT CARD at check-in to the  Emergency Department and triage nurse. ° °Should you have questions after your visit or need to cancel or reschedule your appointment, please contact [facility]  Dept: [phone]  and follow the prompts.  Office hours are 8:00 a.m. to 4:30 p.m. Monday - Friday. Please note that voicemails left after 4:00 p.m. may not be returned until the following business day.  We are closed weekends and major holidays. You have access to a nurse at all times for urgent questions. Please call the main number to the clinic Dept: [phone] and follow the prompts. ° ° °For any non-urgent questions, you may also contact your provider using MyChart. We now offer e-Visits for anyone 18 and older to request care online for non-urgent symptoms. For details visit [email or web address]. °  °Also download the MyChart app! Go to the app store, search "MyChart", open the app, select , and log in with your MyChart username and password. ° °Due to Covid, a mask is required upon entering the hospital/clinic. If you do not have a mask, one will be given to you upon arrival. For doctor visits, patients may have 1 support person aged 18 or older with them. For treatment visits, patients cannot have anyone with them due to current Covid guidelines and our immunocompromised population.  ° °

## 2021-08-22 ENCOUNTER — Telehealth: Payer: Self-pay | Admitting: Hematology

## 2021-08-22 ENCOUNTER — Other Ambulatory Visit: Payer: Self-pay | Admitting: Internal Medicine

## 2021-08-22 ENCOUNTER — Other Ambulatory Visit: Payer: Self-pay

## 2021-08-22 DIAGNOSIS — E1169 Type 2 diabetes mellitus with other specified complication: Secondary | ICD-10-CM

## 2021-08-22 DIAGNOSIS — I10 Essential (primary) hypertension: Secondary | ICD-10-CM

## 2021-08-22 NOTE — Telephone Encounter (Signed)
Scheduled follow-up appointments per 2/10 los. Patient is aware. 

## 2021-08-23 ENCOUNTER — Other Ambulatory Visit: Payer: Self-pay

## 2021-08-23 ENCOUNTER — Other Ambulatory Visit: Payer: Self-pay | Admitting: Internal Medicine

## 2021-08-23 DIAGNOSIS — E1169 Type 2 diabetes mellitus with other specified complication: Secondary | ICD-10-CM

## 2021-08-23 DIAGNOSIS — E785 Hyperlipidemia, unspecified: Secondary | ICD-10-CM

## 2021-08-23 MED ORDER — AMLODIPINE BESYLATE 10 MG PO TABS
ORAL_TABLET | Freq: Every day | ORAL | 0 refills | Status: DC
  Filled 2021-08-23: qty 30, 30d supply, fill #0

## 2021-08-23 MED ORDER — PRAVASTATIN SODIUM 20 MG PO TABS
ORAL_TABLET | Freq: Every day | ORAL | 0 refills | Status: DC
  Filled 2021-08-23: qty 7, 7d supply, fill #0

## 2021-08-23 NOTE — Telephone Encounter (Signed)
Requested medication (s) are due for refill today: yes  Requested medication (s) are on the active medication list: yes  Last refill:  07/18/21 #30 with 0 RF  Future visit scheduled: 08/29/21  Notes to clinic: Failed protocol of labs within 360 days,  (lipids from 04/2019) has upcoming appt, please assess.   Requested Prescriptions  Pending Prescriptions Disp Refills   pravastatin (PRAVACHOL) 20 MG tablet 30 tablet 0    Sig: TAKE 1 TABLET (20 MG TOTAL) BY MOUTH DAILY.     Cardiovascular:  Antilipid - Statins Failed - 08/22/2021  8:00 AM      Failed - Lipid Panel in normal range within the last 12 months    Cholesterol, Total  Date Value Ref Range Status  04/25/2019 184 100 - 199 mg/dL Final   LDL Chol Calc (NIH)  Date Value Ref Range Status  04/25/2019 76 0 - 99 mg/dL Final   HDL  Date Value Ref Range Status  04/25/2019 88 >39 mg/dL Final   Triglycerides  Date Value Ref Range Status  04/25/2019 114 0 - 149 mg/dL Final         Passed - Patient is not pregnant      Passed - Valid encounter within last 12 months    Recent Outpatient Visits           7 months ago Type 2 diabetes mellitus with peripheral neuropathy ([place])   [place], Neoma Laming B, MD   11 months ago Type 2 diabetes mellitus with peripheral neuropathy [facility])   [place] [name], MD   1 year ago Medicare annual wellness visit, initial   [place], [place] J, NP   1 year ago Type 2 diabetes mellitus with peripheral neuropathy [facility])   [place] Karle Plumber B, MD   1 year ago Controlled type 2 diabetes mellitus with stage 3 chronic kidney disease, with long-term current use of insulin ([place])   [place], MD       Future Appointments             In 1 week [name], MD [place]            Signed Prescriptions Disp Refills   amLODipine (NORVASC) 10 MG tablet 30 tablet 0    Sig: TAKE 1 TABLET (10 MG TOTAL) BY MOUTH DAILY.     Cardiovascular: Calcium Channel Blockers 2 Failed - 08/22/2021  8:00 AM      Failed - Valid encounter within last 6 months    Recent Outpatient Visits           7 months ago Type 2 diabetes mellitus with peripheral neuropathy [facility])   [place] Karle Plumber B, MD   11 months ago Type 2 diabetes mellitus with peripheral neuropathy [facility])   [facility] [name], MD   1 year ago Medicare annual wellness visit, initial   [place], [place] J, NP   1 year ago Type 2 diabetes mellitus with peripheral neuropathy [facility])   [place] [name], MD   1 year ago Controlled type 2 diabetes mellitus with stage 3 chronic kidney disease, with long-term current use of insulin ([place])   [place]  [place], MD       Future Appointments             In 1 week [name], MD [place] BP in normal range    BP Readings from Last 1 Encounters:  08/19/21 132/75          Passed - Last Heart Rate in normal range    Pulse Readings from Last 1 Encounters:  08/19/21 79

## 2021-08-23 NOTE — Telephone Encounter (Signed)
Requested Prescriptions  Pending Prescriptions Disp Refills   pravastatin (PRAVACHOL) 20 MG tablet 30 tablet 0    Sig: TAKE 1 TABLET (20 MG TOTAL) BY MOUTH DAILY.     Cardiovascular:  Antilipid - Statins Failed - 08/22/2021  8:00 AM      Failed - Lipid Panel in normal range within the last 12 months    Cholesterol, Total  Date Value Ref Range Status  04/25/2019 184 100 - 199 mg/dL Final   LDL Chol Calc (NIH)  Date Value Ref Range Status  04/25/2019 76 0 - 99 mg/dL Final   HDL  Date Value Ref Range Status  04/25/2019 88 >39 mg/dL Final   Triglycerides  Date Value Ref Range Status  04/25/2019 114 0 - 149 mg/dL Final         Passed - Patient is not pregnant      Passed - Valid encounter within last 12 months    Recent Outpatient Visits          7 months ago Type 2 diabetes mellitus with peripheral neuropathy ([place])   [place], Neoma Laming B, MD   11 months ago Type 2 diabetes mellitus with peripheral neuropathy [facility])   [place] [name], MD   1 year ago Medicare annual wellness visit, initial   [place], [place] J, NP   1 year ago Type 2 diabetes mellitus with peripheral neuropathy [facility])   [place] Karle Plumber B, MD   1 year ago Controlled type 2 diabetes mellitus with stage 3 chronic kidney disease, with long-term current use of insulin ([place])   [place], MD      Future Appointments            In 1 week [name], MD [place]            amLODipine (NORVASC) 10 MG tablet 30 tablet 0    Sig: TAKE 1 TABLET (10 MG TOTAL) BY MOUTH DAILY.     Cardiovascular: Calcium Channel Blockers 2 Failed - 08/22/2021  8:00 AM      Failed - Valid encounter within last 6 months    Recent Outpatient Visits          7 months ago Type 2  diabetes mellitus with peripheral neuropathy [facility])   [place] Karle Plumber B, MD   11 months ago Type 2 diabetes mellitus with peripheral neuropathy [facility])   [place] [name], MD   1 year ago Medicare annual wellness visit, initial   [place], [place] J, NP   1 year ago Type 2 diabetes mellitus with peripheral neuropathy [facility])   [place], Deborah B, MD   1 year ago Controlled type 2 diabetes mellitus with stage 3 chronic kidney disease, with long-term current use of insulin [facility])   [place], MD      Future Appointments            In 1 week [name], MD [place] BP in normal range    BP Readings from Last 1 Encounters:  08/19/21 132/75         Passed - Last Heart Rate in normal range    Pulse Readings from Last 1 Encounters:  08/19/21 79

## 2021-08-23 NOTE — Telephone Encounter (Signed)
Requested medication (s) are due for refill today: yes  Requested medication (s) are on the active medication list: yes  Last refill:  07/18/21 #30 with 0 RF  Future visit scheduled: 08/29/21  Notes to clinic:  Failed protocol of labs within 12 months, (lipids from 04/2019)  has upcoming appt, please assess. Requested Prescriptions  Pending Prescriptions Disp Refills   pravastatin (PRAVACHOL) 20 MG tablet 30 tablet 0    Sig: TAKE 1 TABLET (20 MG TOTAL) BY MOUTH DAILY.     Cardiovascular:  Antilipid - Statins Failed - 08/22/2021  8:00 AM      Failed - Lipid Panel in normal range within the last 12 months    Cholesterol, Total  Date Value Ref Range Status  04/25/2019 184 100 - 199 mg/dL Final   LDL Chol Calc (NIH)  Date Value Ref Range Status  04/25/2019 76 0 - 99 mg/dL Final   HDL  Date Value Ref Range Status  04/25/2019 88 >39 mg/dL Final   Triglycerides  Date Value Ref Range Status  04/25/2019 114 0 - 149 mg/dL Final         Passed - Patient is not pregnant      Passed - Valid encounter within last 12 months    Recent Outpatient Visits           7 months ago Type 2 diabetes mellitus with peripheral neuropathy ([place])   [place], Neoma Laming B, MD   11 months ago Type 2 diabetes mellitus with peripheral neuropathy [facility]   [place] [name], MD   1 year ago Medicare annual wellness visit, initial   [place], [place] J, NP   1 year ago Type 2 diabetes mellitus with peripheral neuropathy [facility])   [place] Karle Plumber B, MD   1 year ago Controlled type 2 diabetes mellitus with stage 3 chronic kidney disease, with long-term current use of insulin ([place])   [place], MD       Future Appointments             In 1 week [name], MD [place]            Signed Prescriptions Disp Refills   amLODipine (NORVASC) 10 MG tablet 30 tablet 0    Sig: TAKE 1 TABLET (10 MG TOTAL) BY MOUTH DAILY.     Cardiovascular: Calcium Channel Blockers 2 Failed - 08/22/2021  8:00 AM      Failed - Valid encounter within last 6 months    Recent Outpatient Visits           7 months ago Type 2 diabetes mellitus with peripheral neuropathy [facility])   [place] Karle Plumber B, MD   11 months ago Type 2 diabetes mellitus with peripheral neuropathy [facility])   [place] [name], MD   1 year ago Medicare annual wellness visit, initial   [place], [place] J, NP   1 year ago Type 2 diabetes mellitus with peripheral neuropathy [facility])   [place] [name], MD   1 year ago Controlled type 2 diabetes mellitus with stage 3 chronic kidney disease, with long-term current use of insulin ([place])   [place], MD       Future Appointments             In 1 week [name], MD [place] BP in normal range    BP Readings from Last 1 Encounters:  08/19/21 132/75          Passed - Last Heart Rate in normal range    Pulse Readings from Last 1 Encounters:  08/19/21 79

## 2021-08-23 NOTE — Telephone Encounter (Signed)
Appointment scheduled 2/20 Requested Prescriptions  Pending Prescriptions Disp Refills   pravastatin (PRAVACHOL) 20 MG tablet 30 tablet 0    Sig: TAKE 1 TABLET (20 MG TOTAL) BY MOUTH DAILY.     Cardiovascular:  Antilipid - Statins Failed - 08/23/2021 12:24 PM      Failed - Lipid Panel in normal range within the last 12 months    Cholesterol, Total  Date Value Ref Range Status  04/25/2019 184 100 - 199 mg/dL Final   LDL Chol Calc (NIH)  Date Value Ref Range Status  04/25/2019 76 0 - 99 mg/dL Final   HDL  Date Value Ref Range Status  04/25/2019 88 >39 mg/dL Final   Triglycerides  Date Value Ref Range Status  04/25/2019 114 0 - 149 mg/dL Final         Passed - Patient is not pregnant      Passed - Valid encounter within last 12 months    Recent Outpatient Visits          7 months ago Type 2 diabetes mellitus with peripheral neuropathy ([place])   [place], Neoma Laming B, MD   11 months ago Type 2 diabetes mellitus with peripheral neuropathy [facility])   [place] [name], MD   1 year ago Medicare annual wellness visit, initial   [place], [place] J, NP   1 year ago Type 2 diabetes mellitus with peripheral neuropathy [facility])   [place] Karle Plumber B, MD   1 year ago Controlled type 2 diabetes mellitus with stage 3 chronic kidney disease, with long-term current use of insulin [facility])   [place], MD      Future Appointments            In 1 week [name], MD [place]

## 2021-08-24 ENCOUNTER — Other Ambulatory Visit: Payer: Self-pay

## 2021-08-24 DIAGNOSIS — C9002 Multiple myeloma in relapse: Secondary | ICD-10-CM

## 2021-08-24 MED FILL — Dexamethasone Sodium Phosphate Inj 100 MG/10ML: INTRAMUSCULAR | Qty: 2 | Status: AC

## 2021-08-25 ENCOUNTER — Encounter: Payer: Self-pay | Admitting: Hematology

## 2021-08-25 ENCOUNTER — Other Ambulatory Visit: Payer: Self-pay

## 2021-08-25 ENCOUNTER — Inpatient Hospital Stay: Payer: Medicare HMO

## 2021-08-25 VITALS — BP 131/75 | HR 77 | Temp 98.5°F | Resp 18 | Wt 163.0 lb

## 2021-08-25 DIAGNOSIS — E1142 Type 2 diabetes mellitus with diabetic polyneuropathy: Secondary | ICD-10-CM | POA: Diagnosis not present

## 2021-08-25 DIAGNOSIS — C9002 Multiple myeloma in relapse: Secondary | ICD-10-CM

## 2021-08-25 DIAGNOSIS — Z7189 Other specified counseling: Secondary | ICD-10-CM

## 2021-08-25 DIAGNOSIS — Z5112 Encounter for antineoplastic immunotherapy: Secondary | ICD-10-CM | POA: Diagnosis not present

## 2021-08-25 DIAGNOSIS — G620 Drug-induced polyneuropathy: Secondary | ICD-10-CM | POA: Diagnosis not present

## 2021-08-25 DIAGNOSIS — I129 Hypertensive chronic kidney disease with stage 1 through stage 4 chronic kidney disease, or unspecified chronic kidney disease: Secondary | ICD-10-CM | POA: Diagnosis not present

## 2021-08-25 DIAGNOSIS — N189 Chronic kidney disease, unspecified: Secondary | ICD-10-CM | POA: Diagnosis not present

## 2021-08-25 DIAGNOSIS — T451X5A Adverse effect of antineoplastic and immunosuppressive drugs, initial encounter: Secondary | ICD-10-CM | POA: Diagnosis not present

## 2021-08-25 DIAGNOSIS — E1122 Type 2 diabetes mellitus with diabetic chronic kidney disease: Secondary | ICD-10-CM | POA: Diagnosis not present

## 2021-08-25 LAB — CBC WITH DIFFERENTIAL (CANCER CENTER ONLY)
Abs Immature Granulocytes: 0.02 10*3/uL (ref 0.00–0.07)
Basophils Absolute: 0 10*3/uL (ref 0.0–0.1)
Basophils Relative: 0 %
Eosinophils Absolute: 0.1 10*3/uL (ref 0.0–0.5)
Eosinophils Relative: 3 %
HCT: 34.5 % — ABNORMAL LOW (ref 39.0–52.0)
Hemoglobin: 12.1 g/dL — ABNORMAL LOW (ref 13.0–17.0)
Immature Granulocytes: 0 %
Lymphocytes Relative: 20 %
Lymphs Abs: 1 10*3/uL (ref 0.7–4.0)
MCH: 34.6 pg — ABNORMAL HIGH (ref 26.0–34.0)
MCHC: 35.1 g/dL (ref 30.0–36.0)
MCV: 98.6 fL (ref 80.0–100.0)
Monocytes Absolute: 0.5 10*3/uL (ref 0.1–1.0)
Monocytes Relative: 10 %
Neutro Abs: 3.3 10*3/uL (ref 1.7–7.7)
Neutrophils Relative %: 67 %
Platelet Count: 191 10*3/uL (ref 150–400)
RBC: 3.5 MIL/uL — ABNORMAL LOW (ref 4.22–5.81)
RDW: 13.8 % (ref 11.5–15.5)
WBC Count: 4.9 10*3/uL (ref 4.0–10.5)
nRBC: 0 % (ref 0.0–0.2)

## 2021-08-25 LAB — CMP (CANCER CENTER ONLY)
ALT: 30 U/L (ref 0–44)
AST: 22 U/L (ref 15–41)
Albumin: 3.9 g/dL (ref 3.5–5.0)
Alkaline Phosphatase: 84 U/L (ref 38–126)
Anion gap: 7 (ref 5–15)
BUN: 13 mg/dL (ref 8–23)
CO2: 27 mmol/L (ref 22–32)
Calcium: 9.7 mg/dL (ref 8.9–10.3)
Chloride: 106 mmol/L (ref 98–111)
Creatinine: 1.26 mg/dL — ABNORMAL HIGH (ref 0.61–1.24)
GFR, Estimated: >60 mL/min (ref >60)
Glucose, Bld: 108 mg/dL — ABNORMAL HIGH (ref 70–99)
Potassium: 3.5 mmol/L (ref 3.5–5.1)
Sodium: 140 mmol/L (ref 135–145)
Total Bilirubin: 0.8 mg/dL (ref 0.3–1.2)
Total Protein: 6.6 g/dL (ref 6.5–8.1)

## 2021-08-25 MED ORDER — FAMOTIDINE IN NACL 20-0.9 MG/50ML-% IV SOLN
20.0000 mg | Freq: Once | INTRAVENOUS | Status: AC
  Administered 2021-08-25: 20 mg via INTRAVENOUS
  Filled 2021-08-25: qty 50

## 2021-08-25 MED ORDER — SODIUM CHLORIDE 0.9 % IV SOLN
16.0000 mg/kg | Freq: Once | INTRAVENOUS | Status: AC
  Administered 2021-08-25: 1200 mg via INTRAVENOUS
  Filled 2021-08-25: qty 60

## 2021-08-25 MED ORDER — SODIUM CHLORIDE 0.9 % IV SOLN
20.0000 mg | Freq: Once | INTRAVENOUS | Status: AC
  Administered 2021-08-25: 20 mg via INTRAVENOUS
  Filled 2021-08-25: qty 20

## 2021-08-25 MED ORDER — ACETAMINOPHEN 325 MG PO TABS
650.0000 mg | ORAL_TABLET | Freq: Once | ORAL | Status: AC
  Administered 2021-08-25: 650 mg via ORAL
  Filled 2021-08-25: qty 2

## 2021-08-25 MED ORDER — DIPHENHYDRAMINE HCL 25 MG PO CAPS
50.0000 mg | ORAL_CAPSULE | Freq: Once | ORAL | Status: AC
  Administered 2021-08-25: 50 mg via ORAL
  Filled 2021-08-25: qty 2

## 2021-08-25 MED ORDER — SODIUM CHLORIDE 0.9 % IV SOLN: Freq: Once | INTRAVENOUS | Status: AC

## 2021-08-25 NOTE — Progress Notes (Addendum)
HEMATOLOGY/ONCOLOGY CLINIC NOTE  Date of Service: .08/19/2021   Patient Care Team: [name], MD as PCP - General (Internal Medicine) [name], MD (Inactive) as Consulting Physician (Hematology and Oncology)  CHIEF COMPLAINTS/PURPOSE OF CONSULTATION:  Patient is here for continued evaluation and management of his relapsed multiple myeloma.  Oncologic History:  76 y.o. male with diagnosis of IgA lambda active multiple myeloma, ISS Stage I. Active disease diagnosed based on presence of anemia, kidney insufficiency, and paraproteinemia with significant predominance of lambda light chains as well as significant elevation of IgA. Bone marrow biopsy confirmed presence of atypical monoclonal plasma cell process in the bone marrow comprising at least 7% of the cellularity. Based on the findings, patient was started on treatment with lenalidomide, bortezomib, and low-dose dexamethasone based on the anticipated tolerance by the patient. Lenalidomide was started at 10 mg daily based on creatinine clearance of 42, dexamethasone dose was reduced to 20 mg weekly based on patient's age. Treatment was complicated by rapid development of cutaneous rash attributed to lenalidomide, inaddition to decreased renal function and now patient has persistent severe anemia. Side-effects were attributed to Lenalidomide and lenalidomide was discontinued with subsequent recovery.  Patient has been receiving bortezomib weekly with low-dose dexamethasone in 4-day cycles.   Patient has completed induction systemic therapy for his disease with repeat bone marrow biopsy confirming complete response including no evidence of minimal residual disease by cytogenetics or FISH.  HISTORY OF PRESENTING ILLNESS:  Please see previous note for details of initial presentation.  INTERVAL HISTORY:   [name] is here for continued evaluation and management of his relapsed multiple myeloma prior to cycle 2-day  8 of daratumumab immunotherapy. He notes some grade 1 fatigue. No hypersensitivity reactions. No fevers no chills no night sweats.  No new bone pains. No other reported significant toxicities.  Labs today shows CBC with a hemoglobin of 12.3 with normal WBC count and platelets CMP stable except blood sugar of 277.  MEDICAL HISTORY:  Past Medical History:  Diagnosis Date   Anemia    Arthritis    shoulders,feet    Cataract    per eye dr -has appt 5-11   CKD (chronic kidney disease), stage III (HCC)    Elevated PSA    History of ketoacidosis    03-29-2015    History of sepsis    07-25-2013  non-compliant w/ medication   Hyperlipidemia    Hypertension    Nocturia    Peripheral neuropathy    Prostate cancer ([place])    Type 2 diabetes mellitus with insulin therapy [facility])     SURGICAL HISTORY: Past Surgical History:  Procedure Laterality Date   CIRCUMCISION  1990's   PROSTATE BIOPSY N/A 12/21/2015   Procedure: BIOPSY TRANSRECTAL ULTRASONIC PROSTATE (TUBP);  Surgeon: Festus Aloe, MD;  [facility];  Service: Urology;  Laterality: N/A;    SOCIAL HISTORY: Social History   Socioeconomic History   Marital status: Single    Spouse name: Not on file   Number of children: Not on file   Years of education: Not on file   Highest education level: Not on file  Occupational History   Not on file  Tobacco Use   Smoking status: Every Day    Packs/day: 0.50    Years: 50.00    Pack years: 25.00    Types: Cigarettes   Smokeless tobacco: Never   Tobacco comments:    1 ppwk-4-5 a day  Vaping Use   Vaping Use: Never used  Substance and Sexual Activity   Alcohol use: Yes    Alcohol/week: 1.0 standard drink    Types: 1 Cans of beer per week    Comment: 1 every day-quit couple weeks ago   Drug use: No   Sexual activity: Not Currently  Other Topics Concern   Not on file  Social History Narrative   Not on file   Social Determinants of Health    Financial Resource Strain: Not on file  Food Insecurity: Not on file  Transportation Needs: Not on file  Physical Activity: Not on file  Stress: Not on file  Social Connections: Not on file  Intimate Partner Violence: Not on file    FAMILY HISTORY: Family History  Problem Relation Age of Onset   Kidney disease Mother    Cancer Neg Hx    Colon cancer Neg Hx    Colon polyps Neg Hx    Esophageal cancer Neg Hx    Rectal cancer Neg Hx    Stomach cancer Neg Hx     ALLERGIES:  is allergic to other.  MEDICATIONS:  Current Outpatient Medications  Medication Sig Dispense Refill   Accu-Chek Softclix Lancets lancets Use to test blood sugar 3 times daily. 100 each 3   acyclovir (ZOVIRAX) 400 MG tablet TAKE 1 TABLET (400 MG TOTAL) BY MOUTH 2 (TWO) TIMES DAILY. 60 tablet 5   acyclovir (ZOVIRAX) 400 MG tablet Take 1 tablet (400 mg total) by mouth 2 (two) times daily. 60 tablet 11   amLODipine (NORVASC) 10 MG tablet TAKE 1 TABLET (10 MG TOTAL) BY MOUTH DAILY. 90 tablet 3   aspirin EC 81 MG tablet Take 1 tablet (81 mg total) by mouth daily. 90 tablet 3   b complex vitamins capsule Take 1 capsule by mouth daily. 30 capsule 5   Blood Glucose Monitoring Suppl (ACCU-CHEK GUIDE) w/Device KIT Use to test blood sugar three times daily 1 kit 0   buPROPion (WELLBUTRIN SR) 150 MG 12 hr tablet      Continuous Blood Gluc Receiver (FREESTYLE LIBRE READER) DEVI Use to check blood sugar 3x daily. E11.42 1 each 0   Continuous Blood Gluc Sensor (FREESTYLE LIBRE SENSOR SYSTEM) MISC Use to check blood sugar 3x daily. Change sensor Q 2 wks. E11.42 2 each 12   dexamethasone (DECADRON) 4 MG tablet Take 5 tablets (20 mg total) by mouth daily. Take the day after daratumumab. Take with breakfast 20 tablet 11   diclofenac Sodium (VOLTAREN) 1 % GEL Apply 4 g topically 4 (four) times daily. 150 g 5   donepezil (ARICEPT) 5 MG tablet TAKE 1 TABLET (5 MG TOTAL) BY MOUTH AT BEDTIME. FOR MEMORY ISSUES 30 tablet 5   ferrous  sulfate 325 (65 FE) MG tablet Take 325 mg by mouth daily.     gabapentin (NEURONTIN) 300 MG capsule TAKE 1 CAPSULE BY MOUTH IN THE MORNING AND LUNCH, AND 2 AT BEDTIME 120 capsule 6   glucose blood (ACCU-CHEK GUIDE) test strip Use to test blood sugar 3 times daily. 100 each 2   hydrOXYzine (ATARAX/VISTARIL) 25 MG tablet TAKE 1 TABLET (25 MG TOTAL) BY MOUTH 3 (THREE) TIMES DAILY AS NEEDED FOR ITCHING (RASH). 30 tablet 0   Insulin Lispro Prot & Lispro (HUMALOG MIX 75/25 KWIKPEN) (75-25) 100 UNIT/ML Kwikpen Take 6 units subcu twice a day with meals.  For 3 days after you take dexamethasone, increase the dose to 9 units twice a day with  meals. 15 mL 2   Insulin Pen Needle (BD PEN NEEDLE NANO U/F) 32G X 4 MM MISC USE AS DIRECTED TWICE DAILY WITH INSULIN 100 each 6   ixazomib citrate (NINLARO) 3 MG capsule Take 1 capsule (3 mg) by mouth weekly, 3 weeks on, 1 week off, repeat every 4 weeks. Take on an empty stomach 1hr before or 2hr after meals. 3 capsule 3   lidocaine (LIDODERM) 5 %      lisinopril (ZESTRIL) 2.5 MG tablet Take 1 tablet (2.5 mg total) by mouth daily. 30 tablet 4   Miconazole Nitrate (ATHLETES FOOT POWDER SPRAY) 2 % AERP Spray between toes and under toes once daily. 130 g 4   Multiple Vitamin (MULTIVITAMIN WITH MINERALS) TABS tablet Take 1 tablet by mouth daily. 60 tablet 2   ondansetron (ZOFRAN) 8 MG tablet Take 1 tablet (8 mg total) by mouth 2 (two) times daily as needed (Nausea or vomiting). 30 tablet 1   pravastatin (PRAVACHOL) 20 MG tablet TAKE 1 TABLET (20 MG TOTAL) BY MOUTH DAILY. 90 tablet 2   prochlorperazine (COMPAZINE) 10 MG tablet Take 1 tablet (10 mg total) by mouth every 6 (six) hours as needed (Nausea or vomiting). 30 tablet 1   tadalafil (CIALIS) 20 MG tablet Take 20 mg by mouth as needed.     tiZANidine (ZANAFLEX) 2 MG tablet Take 1 tablet (2 mg total) by mouth at bedtime as needed for muscle spasms. 30 tablet 1   vitamin B-12 (CYANOCOBALAMIN) 100 MCG tablet Take 100 mcg by  mouth daily.     Vitamins/Minerals TABS Take by mouth.     No current facility-administered medications for this visit.    10 Point review of Systems was done is negative except as noted above.   PHYSICAL EXAMINATION: ECOG PERFORMANCE STATUS: 1 - Symptomatic but completely ambulatory Exam was given in a chair  .BP 128/70    Pulse 88    Temp (!) 97.3 F (36.3 C)    Resp 18    Wt 161 lb 1.6 oz (73.1 kg)    SpO2 100%    BMI 25.23 kg/m   NAD GENERAL:alert, in no acute distress and comfortable SKIN: no acute rashes, no significant lesions EYES: conjunctiva are pink and non-injected, sclera anicteric OROPHARYNX: MMM, no exudates, no oropharyngeal erythema or ulceration NECK: supple, no JVD LYMPH:  no palpable lymphadenopathy in the cervical, axillary or inguinal regions LUNGS: clear to auscultation b/l with normal respiratory effort HEART: regular rate & rhythm ABDOMEN:  normoactive bowel sounds , non tender, not distended. Extremity: no pedal edema PSYCH: alert & oriented x 3 with fluent speech NEURO: no focal motor/sensory deficits    LABORATORY DATA:  I have reviewed the data as listed  . CBC Latest Ref Rng & Units 08/19/2021  WBC 4.0 - 10.5 K/uL 5.7  Hemoglobin 13.0 - 17.0 g/dL 12.3(L)  Hematocrit 39.0 - 52.0 % 35.0(L)  Platelets 150 - 400 K/uL 208    . CMP Latest Ref Rng & Units 08/19/2021  Glucose 70 - 99 mg/dL 147(H)  BUN 8 - 23 mg/dL 12  Creatinine 0.61 - 1.24 mg/dL 1.30(H)  Sodium 135 - 145 mmol/L 137  Potassium 3.5 - 5.1 mmol/L 3.6  Chloride 98 - 111 mmol/L 103  CO2 22 - 32 mmol/L 24  Calcium 8.9 - 10.3 mg/dL 9.4  Total Protein 6.5 - 8.1 g/dL 6.9  Total Bilirubin 0.3 - 1.2 mg/dL 0.9  Alkaline Phos 38 - 126 U/L 82  AST 15 -  41 U/L 37  ALT 0 - 44 U/L 37     08/29/17 BM Bx:    08/29/17 Cytogenetics:   03/13/17 Cytogenetics:        PATHOLOGY Surgical Pathology  CASE: [id number]  PATIENT: Robert Sparks  Bone Marrow Report      Clinical  History: Multiple myeloma in relapse ([place]) ,(BH)      DIAGNOSIS:   BONE MARROW, ASPIRATE, CLOT, CORE:  -Hypercellular bone marrow for age with plasma cell neoplasm  -See comment   PERIPHERAL BLOOD:  -Mild normocytic-normochromic anemia  -Eosinophilia   COMMENT:   The bone marrow is hypercellular for age with increased number of plasma  cells representing 8% of all cells in the aspirate associated with  numerous small clusters in the clot and biopsy sections. The plasma  cells display lambda light chain restriction consistent with plasma cell  neoplasm.  The background shows trilineage hematopoiesis with generally  nonspecific myeloid changes, likely secondary in nature in this setting.  Correlation with cytogenetic and FISH studies is recommended.      RADIOGRAPHIC STUDIES: I have personally reviewed the radiological images as listed and agreed with the findings in the report. No results found.  ASSESSMENT & PLAN:   76 y.o. male with   1. IgA Lambda Multiple Myeloma ISS Stage I    Active disease was previously diagnosed based on presence of anemia, kidney insufficiency, and paraproteinemia with significant predominance of lambda light chains as well as significant elevation of IgA. Patient is status post patient was treated with induction bortezomib Revlimid dexamethasone.  Had issues with skin rashes with Revlimid and this was discontinued.  Completed bortezomib dexamethasone induction and achieved complete remission. Was on maintenance Velcade 2 weeks which was then switched to maintenance Ninlaro. Patient had issues with worsening neuropathy which was a combination of Velcade and his diabetes.  Ninlaro was discontinued as well after maintenance of more than 2 years.  2.  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 Bone marrow biopsy shows 8% lambda restricted plasma cells M spike  progressively increasing to 0.7 g/dL PET CT scan with no hypermetabolic osseous lesions  3.  Hypertension 4.  Diabetes type 2 5 .chronic kidney disease due to hypertension and diabetes. 6.  Peripheral neuropathy related to diabetes and previous myeloma treatments. PLAN:  -Patient's labs from today were reviewed with stable CBC CMP within normal limits except blood sugar of 277 -Patient reports no other toxicities to the daratumumab/dexamethasone regimen at this time. -We will evaluate treatment response after 2-3 cycles to determine if we need to add Revlimid -Patient with daratumumab orders were reviewed and signed.  We will continue same supportive medications. -Discussed that patient should follow-up with his primary care physician for optimization of his diabetes management in the context of needing to use steroids.   FOLLOW UP: Please schedule cycle 3 and cycle 4 of daratumumab as per orders. Labs with each treatment MD visit in 3 weeks with cycle 3 D1  The total time spent in the appointment was 32 minutes*.  All of the patient's questions were answered with apparent satisfaction. The patient knows to call the clinic with any problems, questions or concerns.   Sullivan Lone MD MS AAHIVMS [facility] Hematology/Oncology Physician [facility]  .*Total Encounter Time as defined by the Centers for Medicare and Medicaid Services includes, in addition to the face-to-face time of a patient  visit (documented in the note above) non-face-to-face time: obtaining and reviewing outside history, ordering and reviewing medications, tests or procedures, care coordination (communications with other health care professionals or caregivers) and documentation in the medical record.

## 2021-08-25 NOTE — Patient Instructions (Signed)
[facility] MEDICAL [facility]COLOGY  Discharge Instructions: °Thank you for choosing [facility] to provide your oncology and hematology care.  ° °If you have a lab appointment with the Cancer Center, please go directly to the Cancer Center and check in at the registration area. °  °Wear comfortable clothing and clothing appropriate for easy access to any Portacath or PICC line.  ° °We strive to give you quality time with your provider. You may need to reschedule your appointment if you arrive late (15 or more minutes).  Arriving late affects you and other patients whose appointments are after yours.  Also, if you miss three or more appointments without notifying the office, you may be dismissed from the clinic at the provider’s discretion.    °  °For prescription refill requests, have your pharmacy contact our office and allow 72 hours for refills to be completed.   ° °Today you received the following chemotherapy and/or immunotherapy agents: Daratumumab.     °  °To help prevent nausea and vomiting after your treatment, we encourage you to take your nausea medication as directed. ° °BELOW ARE SYMPTOMS THAT SHOULD BE REPORTED IMMEDIATELY: °*FEVER GREATER THAN 100.4 F (38 °C) OR HIGHER °*CHILLS OR SWEATING °*NAUSEA AND VOMITING THAT IS NOT CONTROLLED WITH YOUR NAUSEA MEDICATION °*UNUSUAL SHORTNESS OF BREATH °*UNUSUAL BRUISING OR BLEEDING °*URINARY PROBLEMS (pain or burning when urinating, or frequent urination) °*BOWEL PROBLEMS (unusual diarrhea, constipation, pain near the anus) °TENDERNESS IN MOUTH AND THROAT WITH OR WITHOUT PRESENCE OF ULCERS (sore throat, sores in mouth, or a toothache) °UNUSUAL RASH, SWELLING OR PAIN  °UNUSUAL VAGINAL DISCHARGE OR ITCHING  ° °Items with * indicate a potential emergency and should be followed up as soon as possible or go to the Emergency Department if any problems should occur. ° °Please show the CHEMOTHERAPY ALERT CARD or IMMUNOTHERAPY ALERT CARD at check-in  to the Emergency Department and triage nurse. ° °Should you have questions after your visit or need to cancel or reschedule your appointment, please contact [facility]  Dept: [phone]  and follow the prompts.  Office hours are 8:00 a.m. to 4:30 p.m. Monday - Friday. Please note that voicemails left after 4:00 p.m. may not be returned until the following business day.  We are closed weekends and major holidays. You have access to a nurse at all times for urgent questions. Please call the main number to the clinic Dept: [phone] and follow the prompts. ° ° °For any non-urgent questions, you may also contact your provider using MyChart. We now offer e-Visits for anyone 18 and older to request care online for non-urgent symptoms. For details visit [email or web address]. °  °Also download the MyChart app! Go to the app store, search "MyChart", open the app, select [place], and log in with your MyChart username and password. ° °Due to Covid, a mask is required upon entering the hospital/clinic. If you do not have a mask, one will be given to you upon arrival. For doctor visits, patients may have 1 support person aged 18 or older with them. For treatment visits, patients cannot have anyone with them due to current Covid guidelines and our immunocompromised population.  ° °

## 2021-08-25 NOTE — Addendum Note (Signed)
Addended by: Rafael Bihari on: 08/25/2021 09:09 AM   Modules accepted: Orders

## 2021-08-29 ENCOUNTER — Other Ambulatory Visit: Payer: Self-pay

## 2021-08-29 ENCOUNTER — Ambulatory Visit: Payer: Medicare HMO | Attending: Internal Medicine | Admitting: Internal Medicine

## 2021-08-29 ENCOUNTER — Encounter: Payer: Self-pay | Admitting: Internal Medicine

## 2021-08-29 ENCOUNTER — Other Ambulatory Visit: Payer: Self-pay | Admitting: Pharmacist

## 2021-08-29 VITALS — BP 130/78 | HR 70 | Resp 16 | Wt 170.0 lb

## 2021-08-29 DIAGNOSIS — N1831 Chronic kidney disease, stage 3a: Secondary | ICD-10-CM | POA: Diagnosis not present

## 2021-08-29 DIAGNOSIS — R413 Other amnesia: Secondary | ICD-10-CM | POA: Diagnosis not present

## 2021-08-29 DIAGNOSIS — E1169 Type 2 diabetes mellitus with other specified complication: Secondary | ICD-10-CM

## 2021-08-29 DIAGNOSIS — E663 Overweight: Secondary | ICD-10-CM

## 2021-08-29 DIAGNOSIS — E785 Hyperlipidemia, unspecified: Secondary | ICD-10-CM

## 2021-08-29 DIAGNOSIS — E1142 Type 2 diabetes mellitus with diabetic polyneuropathy: Secondary | ICD-10-CM

## 2021-08-29 DIAGNOSIS — C9002 Multiple myeloma in relapse: Secondary | ICD-10-CM | POA: Diagnosis not present

## 2021-08-29 DIAGNOSIS — F17210 Nicotine dependence, cigarettes, uncomplicated: Secondary | ICD-10-CM | POA: Diagnosis not present

## 2021-08-29 DIAGNOSIS — Z6826 Body mass index (BMI) 26.0-26.9, adult: Secondary | ICD-10-CM

## 2021-08-29 DIAGNOSIS — I10 Essential (primary) hypertension: Secondary | ICD-10-CM

## 2021-08-29 DIAGNOSIS — F17200 Nicotine dependence, unspecified, uncomplicated: Secondary | ICD-10-CM

## 2021-08-29 LAB — GLUCOSE, POCT (MANUAL RESULT ENTRY): POC Glucose: 187 mg/dl — AB (ref 70–99)

## 2021-08-29 LAB — POCT GLYCOSYLATED HEMOGLOBIN (HGB A1C): HbA1c, POC (controlled diabetic range): 8 % — AB (ref 0.0–7.0)

## 2021-08-29 MED ORDER — FREESTYLE LIBRE SENSOR SYSTEM MISC
12 refills | Status: DC
  Filled 2021-08-29: qty 2, fill #0

## 2021-08-29 MED ORDER — DONEPEZIL HCL 5 MG PO TABS
ORAL_TABLET | ORAL | 5 refills | Status: DC
  Filled 2021-08-29: qty 30, 30d supply, fill #0
  Filled 2021-08-29: qty 30, fill #0

## 2021-08-29 MED ORDER — AMLODIPINE BESYLATE 10 MG PO TABS
ORAL_TABLET | Freq: Every day | ORAL | 3 refills | Status: DC
  Filled 2021-08-29 (×2): qty 90, fill #0
  Filled 2021-09-26: qty 30, 30d supply, fill #0
  Filled 2021-10-27: qty 90, 90d supply, fill #1
  Filled 2022-02-23: qty 90, 90d supply, fill #2
  Filled 2022-05-29: qty 90, 90d supply, fill #3

## 2021-08-29 MED ORDER — FREESTYLE LIBRE READER DEVI
0 refills | Status: DC
  Filled 2021-08-29: qty 1, fill #0

## 2021-08-29 MED ORDER — PRAVASTATIN SODIUM 20 MG PO TABS
ORAL_TABLET | Freq: Every day | ORAL | 2 refills | Status: DC
  Filled 2021-08-29: qty 90, fill #0
  Filled 2021-08-29: qty 90, 90d supply, fill #0
  Filled 2021-12-12: qty 90, 90d supply, fill #1
  Filled 2022-05-02: qty 90, 90d supply, fill #2

## 2021-08-29 MED ORDER — FREESTYLE LIBRE SENSOR SYSTEM MISC: 12 refills | Status: DC

## 2021-08-29 MED ORDER — INSULIN LISPRO PROT & LISPRO (75-25 MIX) 100 UNIT/ML KWIKPEN
PEN_INJECTOR | SUBCUTANEOUS | 2 refills | Status: DC
  Filled 2021-08-29 (×2): qty 15, fill #0
  Filled 2021-10-05 – 2021-10-13 (×2): qty 9, 73d supply, fill #0

## 2021-08-29 MED ORDER — FREESTYLE LIBRE READER DEVI
1.0000 | Freq: Once | 0 refills | Status: DC
  Filled 2021-08-29: qty 1, fill #0
  Filled 2021-08-29: qty 1, 1d supply, fill #0

## 2021-08-29 MED ORDER — FREESTYLE LIBRE READER DEVI: 0 refills | Status: DC

## 2021-08-29 MED ORDER — FREESTYLE LIBRE SENSOR SYSTEM MISC
12 refills | Status: DC
  Filled 2021-08-29: qty 2, 28d supply, fill #0
  Filled 2021-08-29: qty 2, fill #0

## 2021-08-29 NOTE — Progress Notes (Addendum)
Patient ID: Robert Sparks, male    DOB: [birth year]  MRN: [id number]  CC: Diabetes and Hypertension   Subjective: Robert Sparks is a 76 y.o. male who presents for chronic ds management His concerns today include:  Patient with history of HTN, DM 2 with polyneuropathy, HL, CKD stage 3, tob dependence, EtOH abuse, prostate CA, OA knee, vit B 12 def, spinal stenosis, and multiple myeloma in remission, memory changes MMSE 24/30 2021).    Pt with newly relapsed MM Rec infusion once a wk with Daratumumab.  Takes 20 mg of dexamethasone the day after his infusions. Reports checking BS 3-4x/day.  Does not have log with him.  Range 150-180.  In mornings range in 130s Reports good appetite.  Tries to stay away from things that elev blood sugars.  He has some noted weight gain. Taking Humalog 75/25 6 units BID No low BS episodes.  His A1c has increased since last visit in June. Libre device prescribed on last visit but pt states he was not aware rxn sent to pharmacy Due for eye exam with Dr. Schuyler Amor Results for orders placed or performed in visit on 08/29/21  Microalbumin / creatinine urine ratio  Result Value Ref Range   Creatinine, Urine 76.6 Not Estab. mg/dL   Microalbumin, Urine 1,027.5 Not Estab. ug/mL   Microalb/Creat Ratio 1,341 (H) 0 - 29 mg/g creat  POCT glucose (manual entry)  Result Value Ref Range   POC Glucose 187 (A) 70 - 99 mg/dl  POCT glycosylated hemoglobin (Hb A1C)  Result Value Ref Range   Hemoglobin A1C     HbA1c POC (<> result, manual entry)     HbA1c, POC (prediabetic range)     HbA1c, POC (controlled diabetic range) 8.0 (A) 0.0 - 7.0 %   HTN: on Norvasc and taking consistently.  He took it already for the morning.  He feels he can do better with limiting salt in the foods.  Denies any chest pains or shortness of breath.  HL:  on Pravastatin and taking consistently.  Memory Issues:  feels memory is not as bad as it was before.  Not taking Aricept consistently.   Last filled 12/2020.  Requests refill today.  Tob dep:  Still smoking 1/2 pk a day.  Not ready to quit.  Patches caused bad dreams  CKD 3:  last eGFR range 56-60.  He last saw Dr. Posey Pronto 06/23/2021.  GFR at that time was 52.  Urinalysis showed 3+ protein.   Patient Active Problem List   Diagnosis Date Noted   Macroalbuminuric diabetic nephropathy ([place]) 08/31/2021   Multiple myeloma in relapse ([place]) 06/24/2021   Counseling regarding advance care planning and goals of care 06/24/2021   Memory changes 04/25/2019   Hyperlipidemia associated with type 2 diabetes mellitus ([place]) 04/25/2019   Dementia associated with other underlying disease without behavioral disturbance ([place]) 03/14/2019   Chronic bilateral low back pain without sciatica 03/14/2019   Spinal stenosis, lumbar region with neurogenic claudication 03/14/2019   History of prostate cancer 12/20/2018   Neuropathy of right hand 12/20/2018   Primary osteoarthritis of right knee 01/07/2018   Diabetic polyneuropathy associated with type 2 diabetes mellitus ([place]) 01/07/2018   Vitamin B12 deficiency 09/04/2017   Cataract of both eyes 04/12/2017   Tobacco dependence 04/12/2017   Multiple myeloma in remission ([place]) 02/23/2017   Hyperlipidemia 06/21/2016   CKD (chronic kidney disease) stage 3, GFR 30-59 ml/min (HCC) 02/23/2016   Malignant neoplasm of prostate ([place]) 02/21/2016  Controlled type 2 diabetes mellitus with stage 3 chronic kidney disease, with long-term current use of insulin ([place]) 03/29/2015   HTN (hypertension) 07/28/2013   Anemia, chronic disease 07/26/2013   ETOH abuse 07/25/2013     Current Outpatient Medications on File Prior to Visit  Medication Sig Dispense Refill   Accu-Chek Softclix Lancets lancets Use to test blood sugar 3 times daily. 100 each 3   acyclovir (ZOVIRAX) 400 MG tablet TAKE 1 TABLET (400 MG TOTAL) BY MOUTH 2 (TWO) TIMES DAILY. 60 tablet 5   acyclovir (ZOVIRAX) 400 MG tablet Take 1 tablet (400 mg total)  by mouth 2 (two) times daily. 60 tablet 11   aspirin EC 81 MG tablet Take 1 tablet (81 mg total) by mouth daily. 90 tablet 3   b complex vitamins capsule Take 1 capsule by mouth daily. 30 capsule 5   Blood Glucose Monitoring Suppl (ACCU-CHEK GUIDE) w/Device KIT Use to test blood sugar three times daily 1 kit 0   buPROPion (WELLBUTRIN SR) 150 MG 12 hr tablet      dexamethasone (DECADRON) 4 MG tablet Take 5 tablets (20 mg total) by mouth daily. Take the day after daratumumab. Take with breakfast 20 tablet 11   diclofenac Sodium (VOLTAREN) 1 % GEL Apply 4 g topically 4 (four) times daily. 150 g 5   ferrous sulfate 325 (65 FE) MG tablet Take 325 mg by mouth daily.     gabapentin (NEURONTIN) 300 MG capsule TAKE 1 CAPSULE BY MOUTH IN THE MORNING AND LUNCH, AND 2 AT BEDTIME 120 capsule 6   glucose blood (ACCU-CHEK GUIDE) test strip Use to test blood sugar 3 times daily. 100 each 2   hydrOXYzine (ATARAX/VISTARIL) 25 MG tablet TAKE 1 TABLET (25 MG TOTAL) BY MOUTH 3 (THREE) TIMES DAILY AS NEEDED FOR ITCHING (RASH). 30 tablet 0   Insulin Pen Needle (BD PEN NEEDLE NANO U/F) 32G X 4 MM MISC USE AS DIRECTED TWICE DAILY WITH INSULIN 100 each 6   ixazomib citrate (NINLARO) 3 MG capsule Take 1 capsule (3 mg) by mouth weekly, 3 weeks on, 1 week off, repeat every 4 weeks. Take on an empty stomach 1hr before or 2hr after meals. 3 capsule 3   lidocaine (LIDODERM) 5 %      Miconazole Nitrate (ATHLETES FOOT POWDER SPRAY) 2 % AERP Spray between toes and under toes once daily. 130 g 4   Multiple Vitamin (MULTIVITAMIN WITH MINERALS) TABS tablet Take 1 tablet by mouth daily. 60 tablet 2   ondansetron (ZOFRAN) 8 MG tablet Take 1 tablet (8 mg total) by mouth 2 (two) times daily as needed (Nausea or vomiting). 30 tablet 1   prochlorperazine (COMPAZINE) 10 MG tablet Take 1 tablet (10 mg total) by mouth every 6 (six) hours as needed (Nausea or vomiting). 30 tablet 1   tadalafil (CIALIS) 20 MG tablet Take 20 mg by mouth as needed.      tiZANidine (ZANAFLEX) 2 MG tablet Take 1 tablet (2 mg total) by mouth at bedtime as needed for muscle spasms. 30 tablet 1   vitamin B-12 (CYANOCOBALAMIN) 100 MCG tablet Take 100 mcg by mouth daily.     Vitamins/Minerals TABS Take by mouth.     No current facility-administered medications on file prior to visit.    Allergies  Allergen Reactions   Other Other (See Comments)    Nicoderm CQ = Bad Dreams Nicotine gum = Bad Dreams     Social History   Socioeconomic History   Marital  status: Single    Spouse name: Not on file   Number of children: Not on file   Years of education: Not on file   Highest education level: Not on file  Occupational History   Not on file  Tobacco Use   Smoking status: Every Day    Packs/day: 0.50    Years: 50.00    Pack years: 25.00    Types: Cigarettes   Smokeless tobacco: Never   Tobacco comments:    1 ppwk-4-5 a day   Vaping Use   Vaping Use: Never used  Substance and Sexual Activity   Alcohol use: Yes    Alcohol/week: 1.0 standard drink    Types: 1 Cans of beer per week    Comment: 1 every day-quit couple weeks ago   Drug use: No   Sexual activity: Not Currently  Other Topics Concern   Not on file  Social History Narrative   Not on file   Social Determinants of Health   Financial Resource Strain: Not on file  Food Insecurity: Not on file  Transportation Needs: Not on file  Physical Activity: Not on file  Stress: Not on file  Social Connections: Not on file  Intimate Partner Violence: Not on file    Family History  Problem Relation Age of Onset   Kidney disease Mother    Cancer Neg Hx    Colon cancer Neg Hx    Colon polyps Neg Hx    Esophageal cancer Neg Hx    Rectal cancer Neg Hx    Stomach cancer Neg Hx     Past Surgical History:  Procedure Laterality Date   CIRCUMCISION  1990's   PROSTATE BIOPSY N/A 12/21/2015   Procedure: BIOPSY TRANSRECTAL ULTRASONIC PROSTATE (TUBP);  Surgeon: Festus Aloe, MD;  Location:  [facility];  Service: Urology;  Laterality: N/A;    ROS: Review of Systems Negative except as stated above  PHYSICAL EXAM: BP 130/78    Pulse 70    Resp 16    Wt 170 lb (77.1 kg)    SpO2 99%    BMI 26.63 kg/m   Wt Readings from Last 3 Encounters:  08/29/21 170 lb (77.1 kg)  08/25/21 163 lb (73.9 kg)  08/19/21 161 lb 1.6 oz (73.1 kg)    Physical Exam General appearance - alert, well appearing, pleasant elderly African-American male and in no distress Mental status - normal mood, behavior, speech, dress, motor activity, and thought processes Mouth - mucous membranes moist, pharynx normal without lesions Neck - supple, no significant adenopathy Chest - clear to auscultation, no wheezes, rales or rhonchi, symmetric air entry Heart - normal rate, regular rhythm, normal S1, S2, no murmurs, rubs, clicks or gallops Extremities - peripheral pulses normal, no pedal edema, no clubbing or cyanosis  CMP Latest Ref Rng & Units 08/25/2021 08/19/2021 08/11/2021  Glucose 70 - 99 mg/dL 108(H) 147(H) 236(H)  BUN 8 - 23 mg/dL '13 12 19  ' Creatinine 0.61 - 1.24 mg/dL 1.26(H) 1.30(H) 1.33(H)  Sodium 135 - 145 mmol/L 140 137 136  Potassium 3.5 - 5.1 mmol/L 3.5 3.6 3.3(L)  Chloride 98 - 111 mmol/L 106 103 104  CO2 22 - 32 mmol/L '27 24 25  ' Calcium 8.9 - 10.3 mg/dL 9.7 9.4 8.8(L)  Total Protein 6.5 - 8.1 g/dL 6.6 6.9 6.5  Total Bilirubin 0.3 - 1.2 mg/dL 0.8 0.9 0.7  Alkaline Phos 38 - 126 U/L 84 82 98  AST 15 - 41 U/L 22 37  16  ALT 0 - 44 U/L 30 37 22   Lipid Panel     Component Value Date/Time   CHOL 184 04/25/2019 0939   TRIG 114 04/25/2019 0939   HDL 88 04/25/2019 0939   CHOLHDL 2.1 04/25/2019 0939   CHOLHDL 3.6 06/21/2016 1047   VLDL 50 (H) 06/21/2016 1047   LDLCALC 76 04/25/2019 0939    CBC    Component Value Date/Time   WBC 4.9 08/25/2021 0729   WBC 5.6 07/13/2021 0741   RBC 3.50 (L) 08/25/2021 0729   HGB 12.1 (L) 08/25/2021 0729   HGB 12.2 (L) 12/06/2017 0920   HGB  10.1 (L) 07/13/2017 0938   HCT 34.5 (L) 08/25/2021 0729   HCT 37.1 (L) 12/06/2017 0920   HCT 30.6 (L) 07/13/2017 0938   PLT 191 08/25/2021 0729   PLT 250 12/06/2017 0920   MCV 98.6 08/25/2021 0729   MCV 98 (H) 12/06/2017 0920   MCV 96.2 07/13/2017 0938   MCH 34.6 (H) 08/25/2021 0729   MCHC 35.1 08/25/2021 0729   RDW 13.8 08/25/2021 0729   RDW 14.1 12/06/2017 0920   RDW 14.4 07/13/2017 0938   LYMPHSABS 1.0 08/25/2021 0729   LYMPHSABS 0.8 (L) 07/13/2017 0938   MONOABS 0.5 08/25/2021 0729   MONOABS 0.4 07/13/2017 0938   EOSABS 0.1 08/25/2021 0729   EOSABS 0.5 07/13/2017 0938   EOSABS 0.8 (H) 11/21/2016 1416   BASOSABS 0.0 08/25/2021 0729   BASOSABS 0.0 07/13/2017 0938    ASSESSMENT AND PLAN:  1. Type 2 diabetes mellitus with peripheral neuropathy (HCC) Not at goal.  Weekly dexamethasone may be playing a role. I recommend that for 3 days after he takes dexamethasone he should increase his insulin to 9 units twice a day.  After that return to 6 units twice a day with meals.  I have sent the prescription again for the [facility] device.  He will stop at the pharmacy to pick it up.  If he needs help in setting it up, I told him he can return to [place] and I will have the clinical pharmacist show him how to do it. - POCT glucose (manual entry) - POCT glycosylated hemoglobin (Hb A1C) - Microalbumin / creatinine urine ratio - Continuous Blood Gluc Sensor (FREESTYLE LIBRE SENSOR SYSTEM) MISC; Change sensor Q 2 wks  Dispense: 2 each; Refill: 12 - Continuous Blood Gluc Receiver (FREESTYLE LIBRE READER) DEVI; 1 Device by Does not apply route once for 1 dose.  Dispense: 1 each; Refill: 0 - Ambulatory referral to Ophthalmology - Insulin Lispro Prot & Lispro (HUMALOG MIX 75/25 KWIKPEN) (75-25) 100 UNIT/ML Kwikpen; Take 6 units subcu twice a day with meals.  For 3 days after you take dexamethasone, increase the dose to 9 units twice a day with meals.  Dispense: 15 mL; Refill: 2  2. Essential  hypertension At goal.  Continue amlodipine - amLODipine (NORVASC) 10 MG tablet; TAKE 1 TABLET (10 MG TOTAL) BY MOUTH DAILY.  Dispense: 90 tablet; Refill: 3  3. Memory changes - donepezil (ARICEPT) 5 MG tablet; TAKE 1 TABLET (5 MG TOTAL) BY MOUTH AT BEDTIME. FOR MEMORY ISSUES  Dispense: 30 tablet; Refill: 5  4. Stage 3a chronic kidney disease (HCC) Stable.  Continue to monitor  5. Tobacco dependence Strongly advised to quit.  Patient not ready to give a trial of quitting.  6. Overweight (BMI 25.0-29.9) He has had some noted weight gain.  This is most likely due to dietary indiscretions plus having to take dexamethasone  every week postinfusion.  7. Hyperlipidemia associated with type 2 diabetes mellitus (HCC) - pravastatin (PRAVACHOL) 20 MG tablet; TAKE 1 TABLET (20 MG TOTAL) BY MOUTH DAILY.  Dispense: 90 tablet; Refill: 2  8.  Multiple myeloma in relapse -Actively being treated by his oncologist Dr. Irene Limbo  Patient was given the opportunity to ask questions.  Patient verbalized understanding of the plan and was able to repeat key elements of the plan.   Orders Placed This Encounter  Procedures   Microalbumin / creatinine urine ratio   Ambulatory referral to Ophthalmology   POCT glucose (manual entry)   POCT glycosylated hemoglobin (Hb A1C)     Requested Prescriptions   Signed Prescriptions Disp Refills   Insulin Lispro Prot & Lispro (HUMALOG MIX 75/25 KWIKPEN) (75-25) 100 UNIT/ML Kwikpen 15 mL 2    Sig: Take 6 units subcu twice a day with meals.  For 3 days after you take dexamethasone, increase the dose to 9 units twice a day with meals.   pravastatin (PRAVACHOL) 20 MG tablet 90 tablet 2    Sig: TAKE 1 TABLET (20 MG TOTAL) BY MOUTH DAILY.   amLODipine (NORVASC) 10 MG tablet 90 tablet 3    Sig: TAKE 1 TABLET (10 MG TOTAL) BY MOUTH DAILY.   donepezil (ARICEPT) 5 MG tablet 30 tablet 5    Sig: TAKE 1 TABLET (5 MG TOTAL) BY MOUTH AT BEDTIME. FOR MEMORY ISSUES    Return in 4  months (on 12/27/2021) for 6 weeks with Lurena Joiner for [employer] .  Karle Plumber, MD, FACP

## 2021-08-29 NOTE — Patient Instructions (Signed)
Continue insulin 6 units twice a day with meals.  For 3 days after you take the dexamethasone, you should increase the insulin to 9 units twice a day with meals.[name]

## 2021-08-30 ENCOUNTER — Ambulatory Visit: Payer: Medicare Other | Admitting: Internal Medicine

## 2021-08-30 LAB — MICROALBUMIN / CREATININE URINE RATIO
Creatinine, Urine: 76.6 mg/dL
Microalb/Creat Ratio: 1341 mg/g creat — ABNORMAL HIGH (ref 0–29)
Microalbumin, Urine: 1027.5 ug/mL

## 2021-08-31 ENCOUNTER — Encounter: Payer: Self-pay | Admitting: Internal Medicine

## 2021-08-31 ENCOUNTER — Other Ambulatory Visit: Payer: Self-pay

## 2021-08-31 ENCOUNTER — Other Ambulatory Visit: Payer: Self-pay | Admitting: Internal Medicine

## 2021-08-31 DIAGNOSIS — C9002 Multiple myeloma in relapse: Secondary | ICD-10-CM

## 2021-08-31 DIAGNOSIS — E1121 Type 2 diabetes mellitus with diabetic nephropathy: Secondary | ICD-10-CM | POA: Insufficient documentation

## 2021-08-31 MED ORDER — LISINOPRIL 2.5 MG PO TABS
2.5000 mg | ORAL_TABLET | Freq: Every day | ORAL | 4 refills | Status: DC
  Filled 2021-08-31: qty 30, 30d supply, fill #0
  Filled 2021-10-17: qty 30, 30d supply, fill #1
  Filled 2021-12-06: qty 30, 30d supply, fill #2
  Filled 2022-02-16: qty 30, 30d supply, fill #3
  Filled 2022-03-27: qty 30, 30d supply, fill #4

## 2021-08-31 MED FILL — Dexamethasone Sodium Phosphate Inj 100 MG/10ML: INTRAMUSCULAR | Qty: 2 | Status: AC

## 2021-08-31 NOTE — Progress Notes (Signed)
Let patient know that he has significant increase in protein in his urine compared to last year.  This is likely due to diabetes and relapse of multiple myeloma.  I recommend starting low-dose of the medication called lisinopril to help with this.  Watch out for any swelling of the tongue or lips with this medication.  If it occurs, stop the medication and be seen. Rxn sent to our pharmacy.   I will forward a copy of the lab to his nephrologist Dr. Posey Pronto. Please print copy of lab and sent to Dr. Posey Pronto with [facility].

## 2021-09-01 ENCOUNTER — Telehealth: Payer: Self-pay

## 2021-09-01 ENCOUNTER — Inpatient Hospital Stay: Payer: Medicare HMO

## 2021-09-01 ENCOUNTER — Other Ambulatory Visit: Payer: Self-pay

## 2021-09-01 VITALS — BP 139/73 | HR 76 | Temp 97.9°F | Resp 18 | Ht 67.0 in | Wt 165.8 lb

## 2021-09-01 DIAGNOSIS — N189 Chronic kidney disease, unspecified: Secondary | ICD-10-CM | POA: Diagnosis not present

## 2021-09-01 DIAGNOSIS — T451X5A Adverse effect of antineoplastic and immunosuppressive drugs, initial encounter: Secondary | ICD-10-CM | POA: Diagnosis not present

## 2021-09-01 DIAGNOSIS — C9002 Multiple myeloma in relapse: Secondary | ICD-10-CM

## 2021-09-01 DIAGNOSIS — E1122 Type 2 diabetes mellitus with diabetic chronic kidney disease: Secondary | ICD-10-CM | POA: Diagnosis not present

## 2021-09-01 DIAGNOSIS — Z7189 Other specified counseling: Secondary | ICD-10-CM

## 2021-09-01 DIAGNOSIS — G620 Drug-induced polyneuropathy: Secondary | ICD-10-CM | POA: Diagnosis not present

## 2021-09-01 DIAGNOSIS — I129 Hypertensive chronic kidney disease with stage 1 through stage 4 chronic kidney disease, or unspecified chronic kidney disease: Secondary | ICD-10-CM | POA: Diagnosis not present

## 2021-09-01 DIAGNOSIS — E1142 Type 2 diabetes mellitus with diabetic polyneuropathy: Secondary | ICD-10-CM | POA: Diagnosis not present

## 2021-09-01 DIAGNOSIS — Z5112 Encounter for antineoplastic immunotherapy: Secondary | ICD-10-CM | POA: Diagnosis not present

## 2021-09-01 LAB — CBC WITH DIFFERENTIAL (CANCER CENTER ONLY)
Abs Immature Granulocytes: 0.01 10*3/uL (ref 0.00–0.07)
Basophils Absolute: 0 10*3/uL (ref 0.0–0.1)
Basophils Relative: 0 %
Eosinophils Absolute: 0.2 10*3/uL (ref 0.0–0.5)
Eosinophils Relative: 3 %
HCT: 33.6 % — ABNORMAL LOW (ref 39.0–52.0)
Hemoglobin: 11.6 g/dL — ABNORMAL LOW (ref 13.0–17.0)
Immature Granulocytes: 0 %
Lymphocytes Relative: 22 %
Lymphs Abs: 1.1 10*3/uL (ref 0.7–4.0)
MCH: 34 pg (ref 26.0–34.0)
MCHC: 34.5 g/dL (ref 30.0–36.0)
MCV: 98.5 fL (ref 80.0–100.0)
Monocytes Absolute: 0.4 10*3/uL (ref 0.1–1.0)
Monocytes Relative: 9 %
Neutro Abs: 3.4 10*3/uL (ref 1.7–7.7)
Neutrophils Relative %: 66 %
Platelet Count: 185 10*3/uL (ref 150–400)
RBC: 3.41 MIL/uL — ABNORMAL LOW (ref 4.22–5.81)
RDW: 13.5 % (ref 11.5–15.5)
WBC Count: 5.2 10*3/uL (ref 4.0–10.5)
nRBC: 0 % (ref 0.0–0.2)

## 2021-09-01 LAB — CMP (CANCER CENTER ONLY)
ALT: 27 U/L (ref 0–44)
AST: 20 U/L (ref 15–41)
Albumin: 3.7 g/dL (ref 3.5–5.0)
Alkaline Phosphatase: 67 U/L (ref 38–126)
Anion gap: 7 (ref 5–15)
BUN: 13 mg/dL (ref 8–23)
CO2: 27 mmol/L (ref 22–32)
Calcium: 9 mg/dL (ref 8.9–10.3)
Chloride: 102 mmol/L (ref 98–111)
Creatinine: 1.22 mg/dL (ref 0.61–1.24)
GFR, Estimated: >60 mL/min (ref >60)
Glucose, Bld: 277 mg/dL — ABNORMAL HIGH (ref 70–99)
Potassium: 3.6 mmol/L (ref 3.5–5.1)
Sodium: 136 mmol/L (ref 135–145)
Total Bilirubin: 0.8 mg/dL (ref 0.3–1.2)
Total Protein: 6.2 g/dL — ABNORMAL LOW (ref 6.5–8.1)

## 2021-09-01 MED ORDER — DIPHENHYDRAMINE HCL 25 MG PO CAPS
50.0000 mg | ORAL_CAPSULE | Freq: Once | ORAL | Status: AC
  Administered 2021-09-01: 50 mg via ORAL
  Filled 2021-09-01: qty 2

## 2021-09-01 MED ORDER — ACETAMINOPHEN 325 MG PO TABS
650.0000 mg | ORAL_TABLET | Freq: Once | ORAL | Status: AC
  Administered 2021-09-01: 650 mg via ORAL
  Filled 2021-09-01: qty 2

## 2021-09-01 MED ORDER — FAMOTIDINE IN NACL 20-0.9 MG/50ML-% IV SOLN
20.0000 mg | Freq: Once | INTRAVENOUS | Status: AC
  Administered 2021-09-01: 20 mg via INTRAVENOUS
  Filled 2021-09-01: qty 50

## 2021-09-01 MED ORDER — SODIUM CHLORIDE 0.9 % IV SOLN
16.0000 mg/kg | Freq: Once | INTRAVENOUS | Status: AC
  Administered 2021-09-01: 1200 mg via INTRAVENOUS
  Filled 2021-09-01: qty 60

## 2021-09-01 MED ORDER — SODIUM CHLORIDE 0.9 % IV SOLN
20.0000 mg | Freq: Once | INTRAVENOUS | Status: AC
  Administered 2021-09-01: 20 mg via INTRAVENOUS
  Filled 2021-09-01: qty 20

## 2021-09-01 MED ORDER — SODIUM CHLORIDE 0.9 % IV SOLN: Freq: Once | INTRAVENOUS | Status: AC

## 2021-09-01 NOTE — Patient Instructions (Signed)
[facility]  Discharge Instructions: °Thank you for choosing [facility] to provide your oncology and hematology care.  ° °If you have a lab appointment with the Cancer Center, please go directly to the Cancer Center and check in at the registration area. °  °Wear comfortable clothing and clothing appropriate for easy access to any Portacath or PICC line.  ° °We strive to give you quality time with your provider. You may need to reschedule your appointment if you arrive late (15 or more minutes).  Arriving late affects you and other patients whose appointments are after yours.  Also, if you miss three or more appointments without notifying the office, you may be dismissed from the clinic at the provider’s discretion.    °  °For prescription refill requests, have your pharmacy contact our office and allow 72 hours for refills to be completed.   ° °Today you received the following chemotherapy and/or immunotherapy agents: Daratumumab.     °  °To help prevent nausea and vomiting after your treatment, we encourage you to take your nausea medication as directed. ° °BELOW ARE SYMPTOMS THAT SHOULD BE REPORTED IMMEDIATELY: °*FEVER GREATER THAN 100.4 F (38 °C) OR HIGHER °*CHILLS OR SWEATING °*NAUSEA AND VOMITING THAT IS NOT CONTROLLED WITH YOUR NAUSEA MEDICATION °*UNUSUAL SHORTNESS OF BREATH °*UNUSUAL BRUISING OR BLEEDING °*URINARY PROBLEMS (pain or burning when urinating, or frequent urination) °*BOWEL PROBLEMS (unusual diarrhea, constipation, pain near the anus) °TENDERNESS IN MOUTH AND THROAT WITH OR WITHOUT PRESENCE OF ULCERS (sore throat, sores in mouth, or a toothache) °UNUSUAL RASH, SWELLING OR PAIN  °UNUSUAL VAGINAL DISCHARGE OR ITCHING  ° °Items with * indicate a potential emergency and should be followed up as soon as possible or go to the Emergency Department if any problems should occur. ° °Please show the CHEMOTHERAPY ALERT CARD or IMMUNOTHERAPY ALERT CARD at check-in  to the Emergency Department and triage nurse. ° °Should you have questions after your visit or need to cancel or reschedule your appointment, please contact [facility]  Dept: [phone]  and follow the prompts.  Office hours are 8:00 a.m. to 4:30 p.m. Monday - Friday. Please note that voicemails left after 4:00 p.m. may not be returned until the following business day.  We are closed weekends and major holidays. You have access to a nurse at all times for urgent questions. Please call the main number to the clinic Dept: [phone] and follow the prompts. ° ° °For any non-urgent questions, you may also contact your provider using MyChart. We now offer e-Visits for anyone 18 and older to request care online for non-urgent symptoms. For details visit [email or web address]. °  °Also download the MyChart app! Go to the app store, search "MyChart", open the app, select , and log in with your MyChart username and password. ° °Due to Covid, a mask is required upon entering the hospital/clinic. If you do not have a mask, one will be given to you upon arrival. For doctor visits, patients may have 1 support person aged 18 or older with them. For treatment visits, patients cannot have anyone with them due to current Covid guidelines and our immunocompromised population.  ° °

## 2021-09-01 NOTE — Telephone Encounter (Signed)
Contacted pt to go over lab results pt didn't answer lvm   Faxed results to Dr. Posey Pronto office   Sent a CRM and forward labs to [facility] to give pt labs when they call back

## 2021-09-05 ENCOUNTER — Other Ambulatory Visit: Payer: Self-pay

## 2021-09-05 DIAGNOSIS — C9002 Multiple myeloma in relapse: Secondary | ICD-10-CM

## 2021-09-06 ENCOUNTER — Other Ambulatory Visit: Payer: Self-pay

## 2021-09-06 ENCOUNTER — Other Ambulatory Visit: Payer: Medicare HMO

## 2021-09-06 ENCOUNTER — Inpatient Hospital Stay (HOSPITAL_BASED_OUTPATIENT_CLINIC_OR_DEPARTMENT_OTHER): Payer: Medicare HMO | Admitting: Hematology

## 2021-09-06 ENCOUNTER — Ambulatory Visit: Payer: Medicare HMO | Admitting: Hematology

## 2021-09-06 ENCOUNTER — Inpatient Hospital Stay: Payer: Medicare HMO

## 2021-09-06 ENCOUNTER — Other Ambulatory Visit: Payer: Self-pay | Admitting: Internal Medicine

## 2021-09-06 VITALS — BP 140/68 | HR 78 | Temp 97.7°F | Resp 18 | Ht 67.0 in | Wt 165.8 lb

## 2021-09-06 DIAGNOSIS — E1122 Type 2 diabetes mellitus with diabetic chronic kidney disease: Secondary | ICD-10-CM | POA: Diagnosis not present

## 2021-09-06 DIAGNOSIS — N189 Chronic kidney disease, unspecified: Secondary | ICD-10-CM | POA: Diagnosis not present

## 2021-09-06 DIAGNOSIS — C9002 Multiple myeloma in relapse: Secondary | ICD-10-CM | POA: Diagnosis not present

## 2021-09-06 DIAGNOSIS — T451X5A Adverse effect of antineoplastic and immunosuppressive drugs, initial encounter: Secondary | ICD-10-CM | POA: Diagnosis not present

## 2021-09-06 DIAGNOSIS — Z5111 Encounter for antineoplastic chemotherapy: Secondary | ICD-10-CM

## 2021-09-06 DIAGNOSIS — T451X5D Adverse effect of antineoplastic and immunosuppressive drugs, subsequent encounter: Secondary | ICD-10-CM | POA: Diagnosis not present

## 2021-09-06 DIAGNOSIS — E1142 Type 2 diabetes mellitus with diabetic polyneuropathy: Secondary | ICD-10-CM

## 2021-09-06 DIAGNOSIS — I129 Hypertensive chronic kidney disease with stage 1 through stage 4 chronic kidney disease, or unspecified chronic kidney disease: Secondary | ICD-10-CM

## 2021-09-06 DIAGNOSIS — Z5112 Encounter for antineoplastic immunotherapy: Secondary | ICD-10-CM | POA: Diagnosis not present

## 2021-09-06 DIAGNOSIS — G620 Drug-induced polyneuropathy: Secondary | ICD-10-CM | POA: Diagnosis not present

## 2021-09-06 LAB — CBC WITH DIFFERENTIAL (CANCER CENTER ONLY)
Abs Immature Granulocytes: 0.01 10*3/uL (ref 0.00–0.07)
Basophils Absolute: 0 10*3/uL (ref 0.0–0.1)
Basophils Relative: 0 %
Eosinophils Absolute: 0.1 10*3/uL (ref 0.0–0.5)
Eosinophils Relative: 2 %
HCT: 36.4 % — ABNORMAL LOW (ref 39.0–52.0)
Hemoglobin: 12.7 g/dL — ABNORMAL LOW (ref 13.0–17.0)
Immature Granulocytes: 0 %
Lymphocytes Relative: 20 %
Lymphs Abs: 1.2 10*3/uL (ref 0.7–4.0)
MCH: 34.9 pg — ABNORMAL HIGH (ref 26.0–34.0)
MCHC: 34.9 g/dL (ref 30.0–36.0)
MCV: 100 fL (ref 80.0–100.0)
Monocytes Absolute: 0.4 10*3/uL (ref 0.1–1.0)
Monocytes Relative: 7 %
Neutro Abs: 4.2 10*3/uL (ref 1.7–7.7)
Neutrophils Relative %: 71 %
Platelet Count: 193 10*3/uL (ref 150–400)
RBC: 3.64 MIL/uL — ABNORMAL LOW (ref 4.22–5.81)
RDW: 13.9 % (ref 11.5–15.5)
WBC Count: 6 10*3/uL (ref 4.0–10.5)
nRBC: 0 % (ref 0.0–0.2)

## 2021-09-06 LAB — CMP (CANCER CENTER ONLY)
ALT: 29 U/L (ref 0–44)
AST: 21 U/L (ref 15–41)
Albumin: 4.3 g/dL (ref 3.5–5.0)
Alkaline Phosphatase: 75 U/L (ref 38–126)
Anion gap: 10 (ref 5–15)
BUN: 13 mg/dL (ref 8–23)
CO2: 26 mmol/L (ref 22–32)
Calcium: 9.5 mg/dL (ref 8.9–10.3)
Chloride: 104 mmol/L (ref 98–111)
Creatinine: 1.22 mg/dL (ref 0.61–1.24)
GFR, Estimated: >60 mL/min (ref >60)
Glucose, Bld: 140 mg/dL — ABNORMAL HIGH (ref 70–99)
Potassium: 3.2 mmol/L — ABNORMAL LOW (ref 3.5–5.1)
Sodium: 140 mmol/L (ref 135–145)
Total Bilirubin: 1.1 mg/dL (ref 0.3–1.2)
Total Protein: 7.3 g/dL (ref 6.5–8.1)

## 2021-09-06 NOTE — Progress Notes (Incomplete)
HEMATOLOGY/ONCOLOGY CLINIC NOTE  Date of Service: .08/19/2021   Patient Care Team: Robert Pier, MD as PCP - General (Internal Medicine) [name], MD (Inactive) as Consulting Physician (Hematology and Oncology)  CHIEF COMPLAINTS/PURPOSE OF CONSULTATION:  Patient is here for continued evaluation and management of his relapsed multiple myeloma.  Oncologic History:  76 y.o. male with diagnosis of IgA lambda active multiple myeloma, ISS Stage I. Active disease diagnosed based on presence of anemia, kidney insufficiency, and paraproteinemia with significant predominance of lambda light chains as well as significant elevation of IgA. Bone marrow biopsy confirmed presence of atypical monoclonal plasma cell process in the bone marrow comprising at least 7% of the cellularity. Based on the findings, patient was started on treatment with lenalidomide, bortezomib, and low-dose dexamethasone based on the anticipated tolerance by the patient. Lenalidomide was started at 10 mg daily based on creatinine clearance of 42, dexamethasone dose was reduced to 20 mg weekly based on patient's age. Treatment was complicated by rapid development of cutaneous rash attributed to lenalidomide, inaddition to decreased renal function and now patient has persistent severe anemia. Side-effects were attributed to Lenalidomide and lenalidomide was discontinued with subsequent recovery.  Patient has been receiving bortezomib weekly with low-dose dexamethasone in 4-day cycles.   Patient has completed induction systemic therapy for his disease with repeat bone marrow biopsy confirming complete response including no evidence of minimal residual disease by cytogenetics or FISH.  HISTORY OF PRESENTING ILLNESS:  Please see previous note for details of initial presentation.  [name] is here for continued evaluation and management of his relapsed multiple myeloma prior to cycle 2-day  8 of daratumumab immunotherapy. He denies any new symptoms and says he is eating well. He does express having some night sweats, but says this been a consistent issue throughout his life and is not something he is concerned with.*** He reports no new bone pains. He also reports minor bowel disturbances and bloating.  Labs today shows CBC with a hemoglobin of 12.7 with normal WBC count and platelets CMP stable except blood sugar of 140 and is being managed by Robert Pier, MD  MEDICAL HISTORY:  Past Medical History:  Diagnosis Date   Anemia    Arthritis    shoulders,feet    Cataract    per eye dr -has appt 5-11   CKD (chronic kidney disease), stage III (HCC)    Elevated PSA    History of ketoacidosis    03-29-2015    History of sepsis    07-25-2013  non-compliant w/ medication   Hyperlipidemia    Hypertension    Nocturia    Peripheral neuropathy    Prostate cancer ([place])    Type 2 diabetes mellitus with insulin therapy [facility])     SURGICAL HISTORY: Past Surgical History:  Procedure Laterality Date   CIRCUMCISION  1990's   PROSTATE BIOPSY N/A 12/21/2015   Procedure: BIOPSY TRANSRECTAL ULTRASONIC PROSTATE (TUBP);  Surgeon: Festus Aloe, MD;  Location: [facility];  Service: Urology;  Laterality: N/A;    SOCIAL HISTORY: Social History   Socioeconomic History   Marital status: Single    Spouse name: Not on file   Number of children: Not on file   Years of education: Not on file   Highest education level: Not on file  Occupational History   Not on file  Tobacco Use   Smoking status: Every Day    Packs/day: 0.50  Years: 50.00    Pack years: 25.00    Types: Cigarettes   Smokeless tobacco: Never   Tobacco comments:    1 ppwk-4-5 a day   Vaping Use   Vaping Use: Never used  Substance and Sexual Activity   Alcohol use: Yes    Alcohol/week: 1.0 standard drink    Types: 1 Cans of beer per week    Comment: 1 every day-quit couple weeks ago   Drug  use: No   Sexual activity: Not Currently  Other Topics Concern   Not on file  Social History Narrative   Not on file   Social Determinants of Health   Financial Resource Strain: Not on file  Food Insecurity: Not on file  Transportation Needs: Not on file  Physical Activity: Not on file  Stress: Not on file  Social Connections: Not on file  Intimate Partner Violence: Not on file    FAMILY HISTORY: Family History  Problem Relation Age of Onset   Kidney disease Mother    Cancer Neg Hx    Colon cancer Neg Hx    Colon polyps Neg Hx    Esophageal cancer Neg Hx    Rectal cancer Neg Hx    Stomach cancer Neg Hx     ALLERGIES:  is allergic to other.  MEDICATIONS:  Current Outpatient Medications  Medication Sig Dispense Refill   Accu-Chek Softclix Lancets lancets Use to test blood sugar 3 times daily. 100 each 3   acyclovir (ZOVIRAX) 400 MG tablet TAKE 1 TABLET (400 MG TOTAL) BY MOUTH 2 (TWO) TIMES DAILY. 60 tablet 5   acyclovir (ZOVIRAX) 400 MG tablet Take 1 tablet (400 mg total) by mouth 2 (two) times daily. 60 tablet 11   amLODipine (NORVASC) 10 MG tablet TAKE 1 TABLET (10 MG TOTAL) BY MOUTH DAILY. 90 tablet 3   aspirin EC 81 MG tablet Take 1 tablet (81 mg total) by mouth daily. 90 tablet 3   b complex vitamins capsule Take 1 capsule by mouth daily. 30 capsule 5   Blood Glucose Monitoring Suppl (ACCU-CHEK GUIDE) w/Device KIT Use to test blood sugar three times daily 1 kit 0   buPROPion (WELLBUTRIN SR) 150 MG 12 hr tablet      Continuous Blood Gluc Receiver (FREESTYLE LIBRE READER) DEVI Use to check blood sugar 3x daily. E11.42 1 each 0   Continuous Blood Gluc Sensor (FREESTYLE LIBRE SENSOR SYSTEM) MISC Use to check blood sugar 3x daily. Change sensor Q 2 wks. E11.42 2 each 12   dexamethasone (DECADRON) 4 MG tablet Take 5 tablets (20 mg total) by mouth daily. Take the day after daratumumab. Take with breakfast 20 tablet 11   diclofenac Sodium (VOLTAREN) 1 % GEL Apply 4 g  topically 4 (four) times daily. 150 g 5   donepezil (ARICEPT) 5 MG tablet TAKE 1 TABLET (5 MG TOTAL) BY MOUTH AT BEDTIME. FOR MEMORY ISSUES 30 tablet 5   ferrous sulfate 325 (65 FE) MG tablet Take 325 mg by mouth daily.     gabapentin (NEURONTIN) 300 MG capsule TAKE 1 CAPSULE BY MOUTH IN THE MORNING AND LUNCH, AND 2 AT BEDTIME 120 capsule 6   glucose blood (ACCU-CHEK GUIDE) test strip Use to test blood sugar 3 times daily. 100 each 2   hydrOXYzine (ATARAX/VISTARIL) 25 MG tablet TAKE 1 TABLET (25 MG TOTAL) BY MOUTH 3 (THREE) TIMES DAILY AS NEEDED FOR ITCHING (RASH). 30 tablet 0   Insulin Lispro Prot & Lispro (HUMALOG MIX 75/25 KWIKPEN) (  75-25) 100 UNIT/ML Kwikpen Take 6 units subcu twice a day with meals.  For 3 days after you take dexamethasone, increase the dose to 9 units twice a day with meals. 15 mL 2   Insulin Pen Needle (BD PEN NEEDLE NANO U/F) 32G X 4 MM MISC USE AS DIRECTED TWICE DAILY WITH INSULIN 100 each 6   ixazomib citrate (NINLARO) 3 MG capsule Take 1 capsule (3 mg) by mouth weekly, 3 weeks on, 1 week off, repeat every 4 weeks. Take on an empty stomach 1hr before or 2hr after meals. 3 capsule 3   lidocaine (LIDODERM) 5 %      lisinopril (ZESTRIL) 2.5 MG tablet Take 1 tablet (2.5 mg total) by mouth daily. 30 tablet 4   Miconazole Nitrate (ATHLETES FOOT POWDER SPRAY) 2 % AERP Spray between toes and under toes once daily. 130 g 4   Multiple Vitamin (MULTIVITAMIN WITH MINERALS) TABS tablet Take 1 tablet by mouth daily. 60 tablet 2   ondansetron (ZOFRAN) 8 MG tablet Take 1 tablet (8 mg total) by mouth 2 (two) times daily as needed (Nausea or vomiting). 30 tablet 1   pravastatin (PRAVACHOL) 20 MG tablet TAKE 1 TABLET (20 MG TOTAL) BY MOUTH DAILY. 90 tablet 2   prochlorperazine (COMPAZINE) 10 MG tablet Take 1 tablet (10 mg total) by mouth every 6 (six) hours as needed (Nausea or vomiting). 30 tablet 1   tadalafil (CIALIS) 20 MG tablet Take 20 mg by mouth as needed.     tiZANidine (ZANAFLEX) 2  MG tablet Take 1 tablet (2 mg total) by mouth at bedtime as needed for muscle spasms. 30 tablet 1   vitamin B-12 (CYANOCOBALAMIN) 100 MCG tablet Take 100 mcg by mouth daily.     Vitamins/Minerals TABS Take by mouth.     No current facility-administered medications for this visit.    10 Point review of Systems was done is negative except as noted above.   PHYSICAL EXAMINATION: *** GENERAL:alert, in no acute distress and comfortable SKIN: no acute rashes, no significant lesions EYES: conjunctiva are pink and non-injected, sclera anicteric OROPHARYNX: MMM, no exudates, no oropharyngeal erythema or ulceration NECK: supple, no JVD LYMPH:  no palpable lymphadenopathy in the cervical, axillary or inguinal regions LUNGS: clear to auscultation b/l with normal respiratory effort HEART: regular rate & rhythm ABDOMEN:  normoactive bowel sounds , non tender, not distended. Extremity: no pedal edema PSYCH: alert & oriented x 3 with fluent speech NEURO: no focal motor/sensory deficits  LABORATORY DATA:  I have reviewed the data as listed  . CBC Latest Ref Rng & Units 08/19/2021  WBC 4.0 - 10.5 K/uL 5.7  Hemoglobin 13.0 - 17.0 g/dL 12.3(L)  Hematocrit 39.0 - 52.0 % 35.0(L)  Platelets 150 - 400 K/uL 208    . CMP Latest Ref Rng & Units 08/19/2021  Glucose 70 - 99 mg/dL 147(H)  BUN 8 - 23 mg/dL 12  Creatinine 0.61 - 1.24 mg/dL 1.30(H)  Sodium 135 - 145 mmol/L 137  Potassium 3.5 - 5.1 mmol/L 3.6  Chloride 98 - 111 mmol/L 103  CO2 22 - 32 mmol/L 24  Calcium 8.9 - 10.3 mg/dL 9.4  Total Protein 6.5 - 8.1 g/dL 6.9  Total Bilirubin 0.3 - 1.2 mg/dL 0.9  Alkaline Phos 38 - 126 U/L 82  AST 15 - 41 U/L 37  ALT 0 - 44 U/L 37     08/29/17 BM Bx:    08/29/17 Cytogenetics:   03/13/17 Cytogenetics:  PATHOLOGY Surgical Pathology  CASE: [id number]  PATIENT: Robert Sparks  Bone Marrow Report      Clinical History: Multiple myeloma in relapse ([place]) ,([facility])       DIAGNOSIS:   BONE MARROW, ASPIRATE, CLOT, CORE:  -Hypercellular bone marrow for age with plasma cell neoplasm  -See comment   PERIPHERAL BLOOD:  -Mild normocytic-normochromic anemia  -Eosinophilia   COMMENT:   The bone marrow is hypercellular for age with increased number of plasma  cells representing 8% of all cells in the aspirate associated with  numerous small clusters in the clot and biopsy sections. The plasma  cells display lambda light chain restriction consistent with plasma cell  neoplasm.  The background shows trilineage hematopoiesis with generally  nonspecific myeloid changes, likely secondary in nature in this setting.  Correlation with cytogenetic and FISH studies is recommended.      RADIOGRAPHIC STUDIES: I have personally reviewed the radiological images as listed and agreed with the findings in the report. No results found.  ASSESSMENT & PLAN:   76 y.o. male with   1. IgA Lambda Multiple Myeloma ISS Stage I    Active disease was previously diagnosed based on presence of anemia, kidney insufficiency, and paraproteinemia with significant predominance of lambda light chains as well as significant elevation of IgA. Patient is status post patient was treated with induction bortezomib Revlimid dexamethasone.  Had issues with skin rashes with Revlimid and this was discontinued.  Completed bortezomib dexamethasone induction and achieved complete remission. Was on maintenance Velcade 2 weeks which was then switched to maintenance Ninlaro. Patient had issues with worsening neuropathy which was a combination of Velcade and his diabetes.  Ninlaro was discontinued as well after maintenance of more than 2 years.  2.  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 Bone marrow biopsy shows 8% lambda restricted plasma cells M spike progressively increasing to 0.7 g/dL PET CT  scan with no hypermetabolic osseous lesions  3.  Hypertension 4.  Diabetes type 2 5 .chronic kidney disease due to hypertension and diabetes. 6.  Peripheral neuropathy related to diabetes and previous myeloma treatments. PLAN:  -Patient's labs from today were reviewed with stable CBC CMP within normal limits except blood sugar of 240 -Patient reports no other toxicities to the daratumumab/dexamethasone regimen at this time. -We will evaluate treatment response after 2-3 cycles to determine if we need to add Revlimid -Patient with daratumumab orders were reviewed and signed.  We will continue same supportive medications. -Discussed that patient should follow-up with his primary care physician for optimization of his diabetes management in the context of needing to use steroids.   FOLLOW UP: Please schedule remaining cycle 3 and cycle 4 of daratumumab every 2 weeks for the next 4 doses. Port flush and labs with each treatment. MD visit in 4 weeks with cycle 4-day 1  The total time spent in the appointment was 32 minutes*.  All of the patient's questions were answered with apparent satisfaction. The patient knows to call the clinic with any problems, questions or concerns.   Sullivan Lone MD MS AAHIVMS [facility] Hematology/Oncology Physician [facility]  .*Total Encounter Time as defined by the Centers for Medicare and Medicaid Services includes, in addition to the face-to-face time of a patient visit (documented in the note above) non-face-to-face time: obtaining and reviewing outside history, ordering and reviewing medications, tests or procedures, care coordination (communications with  other health care professionals or caregivers) and documentation in the medical record.

## 2021-09-07 ENCOUNTER — Other Ambulatory Visit: Payer: Self-pay

## 2021-09-07 ENCOUNTER — Telehealth: Payer: Self-pay | Admitting: Hematology

## 2021-09-07 LAB — KAPPA/LAMBDA LIGHT CHAINS
Kappa free light chain: 31 mg/L — ABNORMAL HIGH (ref 3.3–19.4)
Kappa, lambda light chain ratio: 0.69 (ref 0.26–1.65)
Lambda free light chains: 45 mg/L — ABNORMAL HIGH (ref 5.7–26.3)

## 2021-09-07 MED ORDER — TIZANIDINE HCL 2 MG PO TABS
2.0000 mg | ORAL_TABLET | Freq: Every evening | ORAL | 1 refills | Status: DC | PRN
  Filled 2021-09-07: qty 30, 30d supply, fill #0

## 2021-09-07 MED FILL — Dexamethasone Sodium Phosphate Inj 100 MG/10ML: INTRAMUSCULAR | Qty: 2 | Status: AC

## 2021-09-07 NOTE — Telephone Encounter (Signed)
Requested medication (s) are due for refill today:   Provider to review ? ?Requested medication (s) are on the active medication list:   Yes ? ?Future visit scheduled:   Yes ? ? ?Last ordered: 04/01/2021 #30, 1 refill ? ?Returned because it's a non delegated refill  ? ?Requested Prescriptions  ?Pending Prescriptions Disp Refills  ? tiZANidine (ZANAFLEX) 2 MG tablet 30 tablet 1  ?  Sig: Take 1 tablet (2 mg total) by mouth at bedtime as needed for muscle spasms.  ?  ? Not Delegated - Cardiovascular:  Alpha-2 Agonists - tizanidine Failed - 09/06/2021 12:58 PM  ?  ?  Failed - This refill cannot be delegated  ?  ?  Passed - Valid encounter within last 6 months  ?  Recent Outpatient Visits   ? ?      ? 1 week ago Type 2 diabetes mellitus with peripheral neuropathy ([place])  ? [place] Karle Plumber B, MD  ? 8 months ago Type 2 diabetes mellitus with peripheral neuropathy ([place])  ? [place] Karle Plumber B, MD  ? 1 year ago Type 2 diabetes mellitus with peripheral neuropathy ([place])  ? [place] [name], MD  ? 1 year ago Medicare annual wellness visit, initial  ? [place], [place] J, NP  ? 1 year ago Type 2 diabetes mellitus with peripheral neuropathy ([place])  ? [place] [name], MD  ? ?  ?  ?Future Appointments   ? ?        ? In 3 months [name], MD [place]  ? ?  ? ?  ?  ?  ? ?

## 2021-09-07 NOTE — Telephone Encounter (Signed)
Left message with rescheduled upcoming appointment per 2/28 los. ?

## 2021-09-08 ENCOUNTER — Other Ambulatory Visit: Payer: Self-pay

## 2021-09-08 ENCOUNTER — Inpatient Hospital Stay: Payer: Medicare HMO | Attending: Hematology

## 2021-09-08 VITALS — BP 140/70 | HR 70 | Temp 98.0°F | Resp 17 | Wt 167.5 lb

## 2021-09-08 DIAGNOSIS — Z5112 Encounter for antineoplastic immunotherapy: Secondary | ICD-10-CM | POA: Insufficient documentation

## 2021-09-08 DIAGNOSIS — N189 Chronic kidney disease, unspecified: Secondary | ICD-10-CM | POA: Insufficient documentation

## 2021-09-08 DIAGNOSIS — T451X5D Adverse effect of antineoplastic and immunosuppressive drugs, subsequent encounter: Secondary | ICD-10-CM | POA: Diagnosis not present

## 2021-09-08 DIAGNOSIS — E1122 Type 2 diabetes mellitus with diabetic chronic kidney disease: Secondary | ICD-10-CM | POA: Insufficient documentation

## 2021-09-08 DIAGNOSIS — E1142 Type 2 diabetes mellitus with diabetic polyneuropathy: Secondary | ICD-10-CM | POA: Insufficient documentation

## 2021-09-08 DIAGNOSIS — C9002 Multiple myeloma in relapse: Secondary | ICD-10-CM | POA: Diagnosis not present

## 2021-09-08 DIAGNOSIS — G620 Drug-induced polyneuropathy: Secondary | ICD-10-CM | POA: Diagnosis not present

## 2021-09-08 DIAGNOSIS — Z7189 Other specified counseling: Secondary | ICD-10-CM

## 2021-09-08 DIAGNOSIS — I129 Hypertensive chronic kidney disease with stage 1 through stage 4 chronic kidney disease, or unspecified chronic kidney disease: Secondary | ICD-10-CM | POA: Diagnosis not present

## 2021-09-08 LAB — MULTIPLE MYELOMA PANEL, SERUM
Albumin SerPl Elph-Mcnc: 3.8 g/dL (ref 2.9–4.4)
Albumin/Glob SerPl: 1.5 (ref 0.7–1.7)
Alpha 1: 0.2 g/dL (ref 0.0–0.4)
Alpha2 Glob SerPl Elph-Mcnc: 0.9 g/dL (ref 0.4–1.0)
B-Globulin SerPl Elph-Mcnc: 0.9 g/dL (ref 0.7–1.3)
Gamma Glob SerPl Elph-Mcnc: 0.6 g/dL (ref 0.4–1.8)
Globulin, Total: 2.7 g/dL (ref 2.2–3.9)
IgA: 288 mg/dL (ref 61–437)
IgG (Immunoglobin G), Serum: 590 mg/dL — ABNORMAL LOW (ref 603–1613)
IgM (Immunoglobulin M), Srm: 38 mg/dL (ref 15–143)
M Protein SerPl Elph-Mcnc: 0.2 g/dL — ABNORMAL HIGH
Total Protein ELP: 6.5 g/dL (ref 6.0–8.5)

## 2021-09-08 MED ORDER — SODIUM CHLORIDE 0.9 % IV SOLN: Freq: Once | INTRAVENOUS | Status: AC

## 2021-09-08 MED ORDER — SODIUM CHLORIDE 0.9 % IV SOLN
20.0000 mg | Freq: Once | INTRAVENOUS | Status: AC
  Administered 2021-09-08: 20 mg via INTRAVENOUS
  Filled 2021-09-08: qty 20

## 2021-09-08 MED ORDER — DIPHENHYDRAMINE HCL 25 MG PO CAPS
50.0000 mg | ORAL_CAPSULE | Freq: Once | ORAL | Status: AC
  Administered 2021-09-08: 50 mg via ORAL
  Filled 2021-09-08: qty 2

## 2021-09-08 MED ORDER — SODIUM CHLORIDE 0.9 % IV SOLN
16.0000 mg/kg | Freq: Once | INTRAVENOUS | Status: AC
  Administered 2021-09-08: 1200 mg via INTRAVENOUS
  Filled 2021-09-08: qty 60

## 2021-09-08 MED ORDER — FAMOTIDINE IN NACL 20-0.9 MG/50ML-% IV SOLN
20.0000 mg | Freq: Once | INTRAVENOUS | Status: AC
  Administered 2021-09-08: 20 mg via INTRAVENOUS
  Filled 2021-09-08: qty 50

## 2021-09-08 MED ORDER — ACETAMINOPHEN 325 MG PO TABS
650.0000 mg | ORAL_TABLET | Freq: Once | ORAL | Status: AC
  Administered 2021-09-08: 650 mg via ORAL
  Filled 2021-09-08: qty 2

## 2021-09-08 NOTE — Patient Instructions (Signed)
[facility] MEDICAL ONCOLOGY  Discharge Instructions: °Thank you for choosing [facility] to provide your oncology and hematology care.  ° °If you have a lab appointment with the Cancer Center, please go directly to the Cancer Center and check in at the registration area. °  °Wear comfortable clothing and clothing appropriate for easy access to any Portacath or PICC line.  ° °We strive to give you quality time with your provider. You may need to reschedule your appointment if you arrive late (15 or more minutes).  Arriving late affects you and other patients whose appointments are after yours.  Also, if you miss three or more appointments without notifying the office, you may be dismissed from the clinic at the provider’s discretion.    °  °For prescription refill requests, have your pharmacy contact our office and allow 72 hours for refills to be completed.   ° °Today you received the following chemotherapy and/or immunotherapy agents: Daratumumab.     °  °To help prevent nausea and vomiting after your treatment, we encourage you to take your nausea medication as directed. ° °BELOW ARE SYMPTOMS THAT SHOULD BE REPORTED IMMEDIATELY: °*FEVER GREATER THAN 100.4 F (38 °C) OR HIGHER °*CHILLS OR SWEATING °*NAUSEA AND VOMITING THAT IS NOT CONTROLLED WITH YOUR NAUSEA MEDICATION °*UNUSUAL SHORTNESS OF BREATH °*UNUSUAL BRUISING OR BLEEDING °*URINARY PROBLEMS (pain or burning when urinating, or frequent urination) °*BOWEL PROBLEMS (unusual diarrhea, constipation, pain near the anus) °TENDERNESS IN MOUTH AND THROAT WITH OR WITHOUT PRESENCE OF ULCERS (sore throat, sores in mouth, or a toothache) °UNUSUAL RASH, SWELLING OR PAIN  °UNUSUAL VAGINAL DISCHARGE OR ITCHING  ° °Items with * indicate a potential emergency and should be followed up as soon as possible or go to the Emergency Department if any problems should occur. ° °Please show the CHEMOTHERAPY ALERT CARD or IMMUNOTHERAPY ALERT CARD at check-in  to the Emergency Department and triage nurse. ° °Should you have questions after your visit or need to cancel or reschedule your appointment, please contact [facility] MEDICAL [facility]COL[facility]  Dept: [phone]  and follow the prompts.  Office hours are 8:00 a.m. to 4:30 p.m. Monday - Friday. Please note that voicemails left after 4:00 p.m. may not be returned until the following business day.  We are closed weekends and major holidays. You have access to a nurse at all times for urgent questions. Please call the main number to the clinic Dept: [phone] and follow the prompts. ° ° °For any non-urgent questions, you may also contact your provider using MyChart. We now offer e-Visits for anyone 18 and older to request care online for non-urgent symptoms. For details visit [email or web address]. °  °Also download the MyChart app! Go to the app store, search "MyChart", open the app, select [facility], and log in with your MyChart username and password. ° °Due to Covid, a mask is required upon entering the hospital/clinic. If you do not have a mask, one will be given to you upon arrival. For doctor visits, patients may have 1 support person aged 18 or older with them. For treatment visits, patients cannot have anyone with them due to current Covid guidelines and our immunocompromised population.  ° °

## 2021-09-08 NOTE — Progress Notes (Signed)
HEMATOLOGY/ONCOLOGY CLINIC NOTE  Date of Service: 09/06/2021   Patient Care Team: [name], MD as PCP - General (Internal Medicine) [name], MD (Inactive) as Consulting Physician (Hematology and Oncology)  CHIEF COMPLAINTS/PURPOSE OF CONSULTATION:  Patient is here for continued evaluation and management of his relapsed multiple myeloma.  Oncologic History:  76 y.o. male with diagnosis of IgA lambda active multiple myeloma, ISS Stage I. Active disease diagnosed based on presence of anemia, kidney insufficiency, and paraproteinemia with significant predominance of lambda light chains as well as significant elevation of IgA. Bone marrow biopsy confirmed presence of atypical monoclonal plasma cell process in the bone marrow comprising at least 7% of the cellularity. Based on the findings, patient was started on treatment with lenalidomide, bortezomib, and low-dose dexamethasone based on the anticipated tolerance by the patient. Lenalidomide was started at 10 mg daily based on creatinine clearance of 42, dexamethasone dose was reduced to 20 mg weekly based on patient's age. Treatment was complicated by rapid development of cutaneous rash attributed to lenalidomide, inaddition to decreased renal function and now patient has persistent severe anemia. Side-effects were attributed to Lenalidomide and lenalidomide was discontinued with subsequent recovery.  Patient has been receiving bortezomib weekly with low-dose dexamethasone in 4-day cycles.   Patient has completed induction systemic therapy for his disease with repeat bone marrow biopsy confirming complete response including no evidence of minimal residual disease by cytogenetics or FISH.  HISTORY OF PRESENTING ILLNESS:  Please see previous note for details of initial presentation.  [name] is here for continued evaluation and management of his relapsed multiple myeloma prior to cycle 3-day 1  of daratumumab immunotherapy. He denies any new symptoms and says he is eating well. He does express having some night sweats, but says this been a consistent issue throughout his life and is not something he is concerned with. He reports no new bone pains. He also reports minor bowel disturbances and bloating.  Labs today shows CBC with a hemoglobin of 12.7 with normal WBC count and platelets CMP stable except blood sugar of 140 and is being managed by [name], MD Multiple Myeloma panel showed M spike down to 0.2 g/dL serum kappa lambda free light chain ratio within normal limits.   MEDICAL HISTORY:  Past Medical History:  Diagnosis Date   Anemia    Arthritis    shoulders,feet    Cataract    per eye dr -has appt 5-11   CKD (chronic kidney disease), stage III (HCC)    Elevated PSA    History of ketoacidosis    03-29-2015    History of sepsis    07-25-2013  non-compliant w/ medication   Hyperlipidemia    Hypertension    Nocturia    Peripheral neuropathy    Prostate cancer ([place])    Type 2 diabetes mellitus with insulin therapy [facility])     SURGICAL HISTORY: Past Surgical History:  Procedure Laterality Date   CIRCUMCISION  1990's   PROSTATE BIOPSY N/A 12/21/2015   Procedure: BIOPSY TRANSRECTAL ULTRASONIC PROSTATE (TUBP);  Surgeon: Festus Aloe, MD;  Location[facility];  Service: Urology;  Laterality: N/A;    SOCIAL HISTORY: Social History   Socioeconomic History   Marital status: Single    Spouse name: Not on file   Number of children: Not on file   Years of education: Not on file   Highest education level: Not on file  Occupational  History   Not on file  Tobacco Use   Smoking status: Every Day    Packs/day: 0.50    Years: 50.00    Pack years: 25.00    Types: Cigarettes   Smokeless tobacco: Never   Tobacco comments:    1 ppwk-4-5 a day   Vaping Use   Vaping Use: Never used  Substance and Sexual Activity   Alcohol use: Yes     Alcohol/week: 1.0 standard drink    Types: 1 Cans of beer per week    Comment: 1 every day-quit couple weeks ago   Drug use: No   Sexual activity: Not Currently  Other Topics Concern   Not on file  Social History Narrative   Not on file   Social Determinants of Health   Financial Resource Strain: Not on file  Food Insecurity: Not on file  Transportation Needs: Not on file  Physical Activity: Not on file  Stress: Not on file  Social Connections: Not on file  Intimate Partner Violence: Not on file    FAMILY HISTORY: Family History  Problem Relation Age of Onset   Kidney disease Mother    Cancer Neg Hx    Colon cancer Neg Hx    Colon polyps Neg Hx    Esophageal cancer Neg Hx    Rectal cancer Neg Hx    Stomach cancer Neg Hx     ALLERGIES:  is allergic to other.  MEDICATIONS:  Current Outpatient Medications  Medication Sig Dispense Refill   Accu-Chek Softclix Lancets lancets Use to test blood sugar 3 times daily. 100 each 3   acyclovir (ZOVIRAX) 400 MG tablet TAKE 1 TABLET (400 MG TOTAL) BY MOUTH 2 (TWO) TIMES DAILY. 60 tablet 5   acyclovir (ZOVIRAX) 400 MG tablet Take 1 tablet (400 mg total) by mouth 2 (two) times daily. 60 tablet 11   amLODipine (NORVASC) 10 MG tablet TAKE 1 TABLET (10 MG TOTAL) BY MOUTH DAILY. 90 tablet 3   aspirin EC 81 MG tablet Take 1 tablet (81 mg total) by mouth daily. 90 tablet 3   b complex vitamins capsule Take 1 capsule by mouth daily. 30 capsule 5   Blood Glucose Monitoring Suppl (ACCU-CHEK GUIDE) w/Device KIT Use to test blood sugar three times daily 1 kit 0   buPROPion (WELLBUTRIN SR) 150 MG 12 hr tablet      Continuous Blood Gluc Receiver (FREESTYLE LIBRE READER) DEVI Use to check blood sugar 3x daily. E11.42 1 each 0   Continuous Blood Gluc Sensor (FREESTYLE LIBRE SENSOR SYSTEM) MISC Use to check blood sugar 3x daily. Change sensor Q 2 wks. E11.42 2 each 12   dexamethasone (DECADRON) 4 MG tablet Take 5 tablets (20 mg total) by mouth daily.  Take the day after daratumumab. Take with breakfast 20 tablet 11   diclofenac Sodium (VOLTAREN) 1 % GEL Apply 4 g topically 4 (four) times daily. 150 g 5   donepezil (ARICEPT) 5 MG tablet TAKE 1 TABLET (5 MG TOTAL) BY MOUTH AT BEDTIME. FOR MEMORY ISSUES 30 tablet 5   ferrous sulfate 325 (65 FE) MG tablet Take 325 mg by mouth daily.     gabapentin (NEURONTIN) 300 MG capsule TAKE 1 CAPSULE BY MOUTH IN THE MORNING AND LUNCH, AND 2 AT BEDTIME 120 capsule 6   glucose blood (ACCU-CHEK GUIDE) test strip Use to test blood sugar 3 times daily. 100 each 2   hydrOXYzine (ATARAX/VISTARIL) 25 MG tablet TAKE 1 TABLET (25 MG TOTAL) BY MOUTH  3 (THREE) TIMES DAILY AS NEEDED FOR ITCHING (RASH). 30 tablet 0   Insulin Lispro Prot & Lispro (HUMALOG MIX 75/25 KWIKPEN) (75-25) 100 UNIT/ML Kwikpen Take 6 units subcu twice a day with meals.  For 3 days after you take dexamethasone, increase the dose to 9 units twice a day with meals. 15 mL 2   Insulin Pen Needle (BD PEN NEEDLE NANO U/F) 32G X 4 MM MISC USE AS DIRECTED TWICE DAILY WITH INSULIN 100 each 6   lidocaine (LIDODERM) 5 %      lisinopril (ZESTRIL) 2.5 MG tablet Take 1 tablet (2.5 mg total) by mouth daily. 30 tablet 4   Miconazole Nitrate (ATHLETES FOOT POWDER SPRAY) 2 % AERP Spray between toes and under toes once daily. 130 g 4   Multiple Vitamin (MULTIVITAMIN WITH MINERALS) TABS tablet Take 1 tablet by mouth daily. 60 tablet 2   ondansetron (ZOFRAN) 8 MG tablet Take 1 tablet (8 mg total) by mouth 2 (two) times daily as needed (Nausea or vomiting). 30 tablet 1   pravastatin (PRAVACHOL) 20 MG tablet TAKE 1 TABLET (20 MG TOTAL) BY MOUTH DAILY. 90 tablet 2   prochlorperazine (COMPAZINE) 10 MG tablet Take 1 tablet (10 mg total) by mouth every 6 (six) hours as needed (Nausea or vomiting). 30 tablet 1   tadalafil (CIALIS) 20 MG tablet Take 20 mg by mouth as needed.     tiZANidine (ZANAFLEX) 2 MG tablet Take 1 tablet (2 mg total) by mouth at bedtime as needed for muscle  spasms. 30 tablet 1   vitamin B-12 (CYANOCOBALAMIN) 100 MCG tablet Take 100 mcg by mouth daily.     Vitamins/Minerals TABS Take by mouth.     No current facility-administered medications for this visit.    10 Point review of Systems was done is negative except as noted above.   PHYSICAL EXAMINATION: .BP 140/68 (BP Location: Left Arm, Patient Position: Sitting)    Pulse 78    Temp 97.7 F (36.5 C) (Temporal)    Resp 18    Ht _0  (1.702 m)    Wt 165 lb 12.8 oz (75.2 kg)    SpO2 100%    BMI 25.97 kg/m   GENERAL:alert, in no acute distress and comfortable SKIN: no acute rashes, no significant lesions EYES: conjunctiva are pink and non-injected, sclera anicteric NECK: supple, no JVD LYMPH:  no palpable lymphadenopathy in the cervical, axillary or inguinal regions LUNGS: clear to auscultation b/l with normal respiratory effort HEART: regular rate & rhythm ABDOMEN:  normoactive bowel sounds , non tender, not distended. Extremity: no pedal edema PSYCH: alert & oriented x 3 with fluent speech NEURO: no focal motor/sensory deficits  LABORATORY DATA:  I have reviewed the data as listed  . CBC Latest Ref Rng & Units 09/06/2021 09/01/2021 08/25/2021  WBC 4.0 - 10.5 K/uL 6.0 5.2 4.9  Hemoglobin 13.0 - 17.0 g/dL 12.7(L) 11.6(L) 12.1(L)  Hematocrit 39.0 - 52.0 % 36.4(L) 33.6(L) 34.5(L)  Platelets 150 - 400 K/uL 193 185 191   CMP Latest Ref Rng & Units 09/06/2021 09/01/2021 08/25/2021  Glucose 70 - 99 mg/dL 140(H) 277(H) 108(H)  BUN 8 - 23 mg/dL _1 Creatinine 0.61 - 1.24 mg/dL 1.22 1.22 1.26(H)  Sodium 135 - 145 mmol/L 140 136 140  Potassium 3.5 - 5.1 mmol/L 3.2(L) 3.6 3.5  Chloride 98 - 111 mmol/L 104 102 106  CO2 22 - 32 mmol/L _2 Calcium 8.9 - 10.3 mg/dL 9.5 9.0 9.7  Total Protein 6.5 - 8.1 g/dL 7.3 6.2(L) 6.6  Total Bilirubin 0.3 - 1.2 mg/dL 1.1 0.8 0.8  Alkaline Phos 38 - 126 U/L 75 67 84  AST 15 - 41 U/L _0 ALT 0 - 44 U/L _1 08/29/17 BM  Bx:    08/29/17 Cytogenetics:   03/13/17 Cytogenetics:        PATHOLOGY Surgical Pathology  CASE: [id number]  PATIENT: Carthel Scadden  Bone Marrow Report      Clinical History: Multiple myeloma in relapse ([place]) ,([facility])      DIAGNOSIS:   BONE MARROW, ASPIRATE, CLOT, CORE:  -Hypercellular bone marrow for age with plasma cell neoplasm  -See comment   PERIPHERAL BLOOD:  -Mild normocytic-normochromic anemia  -Eosinophilia   COMMENT:   The bone marrow is hypercellular for age with increased number of plasma  cells representing 8% of all cells in the aspirate associated with  numerous small clusters in the clot and biopsy sections. The plasma  cells display lambda light chain restriction consistent with plasma cell  neoplasm.  The background shows trilineage hematopoiesis with generally  nonspecific myeloid changes, likely secondary in nature in this setting.  Correlation with cytogenetic and FISH studies is recommended.      RADIOGRAPHIC STUDIES: I have personally reviewed the radiological images as listed and agreed with the findings in the report. No results found.  ASSESSMENT & PLAN:   76 y.o. male with   1.  Relapsed IgA Lambda Multiple Myeloma ISS Stage I    Active disease was previously diagnosed based on presence of anemia, kidney insufficiency, and paraproteinemia with significant predominance of lambda light chains as well as significant elevation of IgA. Patient is status post patient was treated with induction bortezomib Revlimid dexamethasone.  Had issues with skin rashes with Revlimid and this was discontinued.  Completed bortezomib dexamethasone induction and achieved complete remission. Was on maintenance Velcade 2 weeks which was then switched to maintenance Ninlaro. Patient had issues with worsening neuropathy which was a combination of Velcade and his diabetes.  Ninlaro was discontinued as well after maintenance of more than 2 years.  2.   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 Bone marrow biopsy shows 8% lambda restricted plasma cells M spike progressively increasing to 0.7 g/dL PET CT scan with no hypermetabolic osseous lesions  3.  Hypertension 4.  Diabetes type 2 5 .chronic kidney disease due to hypertension and diabetes. 6.  Peripheral neuropathy related to diabetes and previous myeloma treatments.  PLAN:  -Patient's labs from today were reviewed with stable CBC with a hemoglobin up to 12.7 normal WBC count and platelets  CMP within normal limits except blood sugar of 140 9 potassium of 3.2   Kappa lambda free light chain ratio now normalized -Myeloma M protein down to 0.2 g/dL -Patient reports no other toxicities to the daratumumab/dexamethasone regimen at this time. -We will evaluate treatment response after 2-3 cycles to determine if we need to add Revlimid -Patient with daratumumab orders were reviewed and signed.  We will continue same supportive medications. -Discussed that patient should follow-up with his primary care physician for optimization of his diabetes management in the context of needing to use steroids.   FOLLOW UP: Please schedule remaining cycle 3 and cycle 4 of daratumumab every 2 weeks for the next 4 doses. Port flush and labs with each  treatment. MD visit in 4 weeks with cycle 4-day 1  The total time spent in the appointment was 30 minutes*.  All of the patient's questions were answered with apparent satisfaction. The patient knows to call the clinic with any problems, questions or concerns.   Sullivan Lone MD MS AAHIVMS [facility] Hematology/Oncology Physician [facility]  .*Total Encounter Time as defined by the Centers for Medicare and Medicaid Services includes, in addition to the face-to-face time of a patient visit (documented in the note above) non-face-to-face time: obtaining  and reviewing outside history, ordering and reviewing medications, tests or procedures, care coordination (communications with other health care professionals or caregivers) and documentation in the medical record.   I, Melene Muller, am acting as scribe for Sullivan Lone MD.   .I have reviewed the above documentation for accuracy and completeness, and I agree with the above. Brunetta Genera MD

## 2021-09-09 ENCOUNTER — Ambulatory Visit: Payer: Medicare HMO

## 2021-09-12 ENCOUNTER — Encounter: Payer: Self-pay | Admitting: Hematology

## 2021-09-14 ENCOUNTER — Other Ambulatory Visit: Payer: Self-pay

## 2021-09-21 ENCOUNTER — Other Ambulatory Visit: Payer: Self-pay | Admitting: *Deleted

## 2021-09-21 DIAGNOSIS — C9002 Multiple myeloma in relapse: Secondary | ICD-10-CM

## 2021-09-21 MED FILL — Dexamethasone Sodium Phosphate Inj 100 MG/10ML: INTRAMUSCULAR | Qty: 2 | Status: AC

## 2021-09-22 ENCOUNTER — Ambulatory Visit: Payer: Medicare HMO

## 2021-09-22 ENCOUNTER — Inpatient Hospital Stay: Payer: Medicare HMO

## 2021-09-22 ENCOUNTER — Other Ambulatory Visit: Payer: Self-pay

## 2021-09-22 ENCOUNTER — Other Ambulatory Visit: Payer: Medicare HMO

## 2021-09-22 VITALS — BP 148/74 | HR 78 | Temp 98.1°F | Resp 18 | Wt 164.0 lb

## 2021-09-22 DIAGNOSIS — E1142 Type 2 diabetes mellitus with diabetic polyneuropathy: Secondary | ICD-10-CM | POA: Diagnosis not present

## 2021-09-22 DIAGNOSIS — T451X5D Adverse effect of antineoplastic and immunosuppressive drugs, subsequent encounter: Secondary | ICD-10-CM | POA: Diagnosis not present

## 2021-09-22 DIAGNOSIS — G620 Drug-induced polyneuropathy: Secondary | ICD-10-CM | POA: Diagnosis not present

## 2021-09-22 DIAGNOSIS — I129 Hypertensive chronic kidney disease with stage 1 through stage 4 chronic kidney disease, or unspecified chronic kidney disease: Secondary | ICD-10-CM | POA: Diagnosis not present

## 2021-09-22 DIAGNOSIS — Z5112 Encounter for antineoplastic immunotherapy: Secondary | ICD-10-CM | POA: Diagnosis not present

## 2021-09-22 DIAGNOSIS — N189 Chronic kidney disease, unspecified: Secondary | ICD-10-CM | POA: Diagnosis not present

## 2021-09-22 DIAGNOSIS — E1122 Type 2 diabetes mellitus with diabetic chronic kidney disease: Secondary | ICD-10-CM | POA: Diagnosis not present

## 2021-09-22 DIAGNOSIS — C9002 Multiple myeloma in relapse: Secondary | ICD-10-CM | POA: Diagnosis not present

## 2021-09-22 LAB — CBC WITH DIFFERENTIAL (CANCER CENTER ONLY)
Abs Immature Granulocytes: 0.01 10*3/uL (ref 0.00–0.07)
Basophils Absolute: 0 10*3/uL (ref 0.0–0.1)
Basophils Relative: 0 %
Eosinophils Absolute: 0.2 10*3/uL (ref 0.0–0.5)
Eosinophils Relative: 3 %
HCT: 35.3 % — ABNORMAL LOW (ref 39.0–52.0)
Hemoglobin: 12.3 g/dL — ABNORMAL LOW (ref 13.0–17.0)
Immature Granulocytes: 0 %
Lymphocytes Relative: 22 %
Lymphs Abs: 1.2 10*3/uL (ref 0.7–4.0)
MCH: 35.1 pg — ABNORMAL HIGH (ref 26.0–34.0)
MCHC: 34.8 g/dL (ref 30.0–36.0)
MCV: 100.9 fL — ABNORMAL HIGH (ref 80.0–100.0)
Monocytes Absolute: 0.4 10*3/uL (ref 0.1–1.0)
Monocytes Relative: 8 %
Neutro Abs: 3.5 10*3/uL (ref 1.7–7.7)
Neutrophils Relative %: 67 %
Platelet Count: 185 10*3/uL (ref 150–400)
RBC: 3.5 MIL/uL — ABNORMAL LOW (ref 4.22–5.81)
RDW: 13.2 % (ref 11.5–15.5)
WBC Count: 5.4 10*3/uL (ref 4.0–10.5)
nRBC: 0 % (ref 0.0–0.2)

## 2021-09-22 LAB — CMP (CANCER CENTER ONLY)
ALT: 38 U/L (ref 0–44)
AST: 25 U/L (ref 15–41)
Albumin: 4 g/dL (ref 3.5–5.0)
Alkaline Phosphatase: 64 U/L (ref 38–126)
Anion gap: 7 (ref 5–15)
BUN: 13 mg/dL (ref 8–23)
CO2: 27 mmol/L (ref 22–32)
Calcium: 9.4 mg/dL (ref 8.9–10.3)
Chloride: 104 mmol/L (ref 98–111)
Creatinine: 1.29 mg/dL — ABNORMAL HIGH (ref 0.61–1.24)
GFR, Estimated: 59 mL/min — ABNORMAL LOW (ref >60)
Glucose, Bld: 199 mg/dL — ABNORMAL HIGH (ref 70–99)
Potassium: 3.4 mmol/L — ABNORMAL LOW (ref 3.5–5.1)
Sodium: 138 mmol/L (ref 135–145)
Total Bilirubin: 1 mg/dL (ref 0.3–1.2)
Total Protein: 6.6 g/dL (ref 6.5–8.1)

## 2021-09-22 MED ORDER — DIPHENHYDRAMINE HCL 25 MG PO CAPS
50.0000 mg | ORAL_CAPSULE | Freq: Once | ORAL | Status: AC
  Administered 2021-09-22: 50 mg via ORAL
  Filled 2021-09-22: qty 2

## 2021-09-22 MED ORDER — FAMOTIDINE IN NACL 20-0.9 MG/50ML-% IV SOLN
20.0000 mg | Freq: Once | INTRAVENOUS | Status: AC
  Administered 2021-09-22: 20 mg via INTRAVENOUS
  Filled 2021-09-22: qty 50

## 2021-09-22 MED ORDER — ACETAMINOPHEN 325 MG PO TABS
650.0000 mg | ORAL_TABLET | Freq: Once | ORAL | Status: AC
  Administered 2021-09-22: 650 mg via ORAL
  Filled 2021-09-22: qty 2

## 2021-09-22 MED ORDER — SODIUM CHLORIDE 0.9 % IV SOLN
16.0000 mg/kg | Freq: Once | INTRAVENOUS | Status: AC
  Administered 2021-09-22: 1200 mg via INTRAVENOUS
  Filled 2021-09-22: qty 60

## 2021-09-22 MED ORDER — SODIUM CHLORIDE 0.9 % IV SOLN
20.0000 mg | Freq: Once | INTRAVENOUS | Status: AC
  Administered 2021-09-22: 20 mg via INTRAVENOUS
  Filled 2021-09-22: qty 20

## 2021-09-22 MED ORDER — SODIUM CHLORIDE 0.9 % IV SOLN: Freq: Once | INTRAVENOUS | Status: AC

## 2021-09-22 NOTE — Patient Instructions (Signed)
Robert Sparks  Discharge Instructions: ?Thank you for choosing [facility] to provide your oncology and hematology care.  ? ?If you have a lab appointment with the [facility], please go directly to the [facility] and check in at the registration area. ?  ?Wear comfortable clothing and clothing appropriate for easy access to any Portacath or PICC line.  ? ?We strive to give you quality time with your provider. You may need to reschedule your appointment if you arrive late (15 or more minutes).  Arriving late affects you and other patients whose appointments are after yours.  Also, if you miss three or more appointments without notifying the office, you may be dismissed from the clinic at the provider?s discretion.    ?  ?For prescription refill requests, have your pharmacy contact our office and allow 72 hours for refills to be completed.   ? ?Today you received the following chemotherapy and/or immunotherapy agent: Daratumumab (Darzalex) ?  ?To help prevent nausea and vomiting after your treatment, we encourage you to take your nausea medication as directed. ? ?BELOW ARE SYMPTOMS THAT SHOULD BE REPORTED IMMEDIATELY: ?*FEVER GREATER THAN 100.4 F (38 ?C) OR HIGHER ?*CHILLS OR SWEATING ?*NAUSEA AND VOMITING THAT IS NOT CONTROLLED WITH YOUR NAUSEA MEDICATION ?*UNUSUAL SHORTNESS OF BREATH ?*UNUSUAL BRUISING OR BLEEDING ?*URINARY PROBLEMS (pain or burning when urinating, or frequent urination) ?*BOWEL PROBLEMS (unusual diarrhea, constipation, pain near the anus) ?TENDERNESS IN MOUTH AND THROAT WITH OR WITHOUT PRESENCE OF ULCERS (sore throat, sores in mouth, or a toothache) ?UNUSUAL RASH, SWELLING OR PAIN  ?UNUSUAL VAGINAL DISCHARGE OR ITCHING  ? ?Items with * indicate a potential emergency and should be followed up as soon as possible or go to the Emergency Department if any problems should occur. ? ?Please show the CHEMOTHERAPY ALERT CARD or IMMUNOTHERAPY ALERT CARD at  check-in to the Emergency Department and triage nurse. ? ?Should you have questions after your visit or need to cancel or reschedule your appointment, please contact [facility]  Dept: [phone]  and follow the prompts.  Office hours are 8:00 a.m. to 4:30 p.m. Monday - Friday. Please note that voicemails left after 4:00 p.m. may not be returned until the following business day.  We are closed weekends and major holidays. You have access to a nurse at all times for urgent questions. Please call the main number to the clinic Dept: [phone] and follow the prompts. ? ? ?For any non-urgent questions, you may also contact your provider using MyChart. We now offer e-Visits for anyone 26 and older to request care online for non-urgent symptoms. For details visit [email or web address]. ?  ?Also download the MyChart app! Go to the app store, search "MyChart", open the app, select [facility], and log in with your MyChart username and password. ? ?Due to Covid, a mask is required upon entering the hospital/clinic. If you do not have a mask, one will be given to you upon arrival. For doctor visits, patients may have 1 support person aged 68 or older with them. For treatment visits, patients cannot have anyone with them due to current Covid guidelines and our immunocompromised population.  ? ?

## 2021-09-26 ENCOUNTER — Other Ambulatory Visit: Payer: Self-pay

## 2021-09-27 ENCOUNTER — Other Ambulatory Visit: Payer: Self-pay

## 2021-10-04 ENCOUNTER — Other Ambulatory Visit: Payer: Self-pay | Admitting: *Deleted

## 2021-10-04 ENCOUNTER — Other Ambulatory Visit: Payer: Self-pay

## 2021-10-04 DIAGNOSIS — C9002 Multiple myeloma in relapse: Secondary | ICD-10-CM

## 2021-10-05 ENCOUNTER — Other Ambulatory Visit: Payer: Self-pay

## 2021-10-06 ENCOUNTER — Other Ambulatory Visit: Payer: Medicare HMO

## 2021-10-06 ENCOUNTER — Ambulatory Visit: Payer: Medicare HMO

## 2021-10-06 ENCOUNTER — Inpatient Hospital Stay (HOSPITAL_BASED_OUTPATIENT_CLINIC_OR_DEPARTMENT_OTHER): Payer: Medicare HMO | Admitting: Hematology

## 2021-10-06 ENCOUNTER — Other Ambulatory Visit: Payer: Self-pay

## 2021-10-06 ENCOUNTER — Inpatient Hospital Stay: Payer: Medicare HMO

## 2021-10-06 VITALS — BP 139/71 | HR 74 | Temp 97.3°F | Resp 18 | Wt 165.5 lb

## 2021-10-06 VITALS — BP 150/87 | HR 76 | Resp 17

## 2021-10-06 DIAGNOSIS — E1140 Type 2 diabetes mellitus with diabetic neuropathy, unspecified: Secondary | ICD-10-CM

## 2021-10-06 DIAGNOSIS — Z5111 Encounter for antineoplastic chemotherapy: Secondary | ICD-10-CM | POA: Diagnosis not present

## 2021-10-06 DIAGNOSIS — N189 Chronic kidney disease, unspecified: Secondary | ICD-10-CM

## 2021-10-06 DIAGNOSIS — E1122 Type 2 diabetes mellitus with diabetic chronic kidney disease: Secondary | ICD-10-CM | POA: Diagnosis not present

## 2021-10-06 DIAGNOSIS — Z7189 Other specified counseling: Secondary | ICD-10-CM

## 2021-10-06 DIAGNOSIS — G620 Drug-induced polyneuropathy: Secondary | ICD-10-CM | POA: Diagnosis not present

## 2021-10-06 DIAGNOSIS — C9002 Multiple myeloma in relapse: Secondary | ICD-10-CM

## 2021-10-06 DIAGNOSIS — I129 Hypertensive chronic kidney disease with stage 1 through stage 4 chronic kidney disease, or unspecified chronic kidney disease: Secondary | ICD-10-CM | POA: Diagnosis not present

## 2021-10-06 DIAGNOSIS — T451X5D Adverse effect of antineoplastic and immunosuppressive drugs, subsequent encounter: Secondary | ICD-10-CM | POA: Diagnosis not present

## 2021-10-06 DIAGNOSIS — Z5112 Encounter for antineoplastic immunotherapy: Secondary | ICD-10-CM | POA: Diagnosis not present

## 2021-10-06 DIAGNOSIS — E1142 Type 2 diabetes mellitus with diabetic polyneuropathy: Secondary | ICD-10-CM | POA: Diagnosis not present

## 2021-10-06 LAB — CBC WITH DIFFERENTIAL (CANCER CENTER ONLY)
Abs Immature Granulocytes: 0.01 10*3/uL (ref 0.00–0.07)
Basophils Absolute: 0 10*3/uL (ref 0.0–0.1)
Basophils Relative: 1 %
Eosinophils Absolute: 0.2 10*3/uL (ref 0.0–0.5)
Eosinophils Relative: 4 %
HCT: 35 % — ABNORMAL LOW (ref 39.0–52.0)
Hemoglobin: 12.8 g/dL — ABNORMAL LOW (ref 13.0–17.0)
Immature Granulocytes: 0 %
Lymphocytes Relative: 21 %
Lymphs Abs: 1.2 10*3/uL (ref 0.7–4.0)
MCH: 36.5 pg — ABNORMAL HIGH (ref 26.0–34.0)
MCHC: 36.6 g/dL — ABNORMAL HIGH (ref 30.0–36.0)
MCV: 99.7 fL (ref 80.0–100.0)
Monocytes Absolute: 0.4 10*3/uL (ref 0.1–1.0)
Monocytes Relative: 7 %
Neutro Abs: 3.9 10*3/uL (ref 1.7–7.7)
Neutrophils Relative %: 67 %
Platelet Count: 218 10*3/uL (ref 150–400)
RBC: 3.51 MIL/uL — ABNORMAL LOW (ref 4.22–5.81)
RDW: 13.2 % (ref 11.5–15.5)
WBC Count: 5.8 10*3/uL (ref 4.0–10.5)
nRBC: 0 % (ref 0.0–0.2)

## 2021-10-06 LAB — CMP (CANCER CENTER ONLY)
ALT: 24 U/L (ref 0–44)
AST: 21 U/L (ref 15–41)
Albumin: 3.9 g/dL (ref 3.5–5.0)
Alkaline Phosphatase: 69 U/L (ref 38–126)
Anion gap: 8 (ref 5–15)
BUN: 12 mg/dL (ref 8–23)
CO2: 25 mmol/L (ref 22–32)
Calcium: 9.4 mg/dL (ref 8.9–10.3)
Chloride: 107 mmol/L (ref 98–111)
Creatinine: 1.33 mg/dL — ABNORMAL HIGH (ref 0.61–1.24)
GFR, Estimated: 56 mL/min — ABNORMAL LOW (ref >60)
Glucose, Bld: 153 mg/dL — ABNORMAL HIGH (ref 70–99)
Potassium: 3.6 mmol/L (ref 3.5–5.1)
Sodium: 140 mmol/L (ref 135–145)
Total Bilirubin: 0.8 mg/dL (ref 0.3–1.2)
Total Protein: 6.4 g/dL — ABNORMAL LOW (ref 6.5–8.1)

## 2021-10-06 MED ORDER — SODIUM CHLORIDE 0.9 % IV SOLN
20.0000 mg | Freq: Once | INTRAVENOUS | Status: AC
  Administered 2021-10-06: 20 mg via INTRAVENOUS
  Filled 2021-10-06: qty 20

## 2021-10-06 MED ORDER — FAMOTIDINE IN NACL 20-0.9 MG/50ML-% IV SOLN
20.0000 mg | Freq: Once | INTRAVENOUS | Status: AC
  Administered 2021-10-06: 20 mg via INTRAVENOUS

## 2021-10-06 MED ORDER — SODIUM CHLORIDE 0.9 % IV SOLN
16.0000 mg/kg | Freq: Once | INTRAVENOUS | Status: AC
  Administered 2021-10-06: 1200 mg via INTRAVENOUS
  Filled 2021-10-06: qty 60

## 2021-10-06 MED ORDER — DEXAMETHASONE 4 MG PO TABS
ORAL_TABLET | ORAL | 11 refills | Status: DC
  Filled 2021-10-06: qty 20, 4d supply, fill #0
  Filled 2022-03-09: qty 20, 4d supply, fill #1
  Filled 2022-07-11: qty 20, 4d supply, fill #2
  Filled 2022-09-25: qty 20, 4d supply, fill #3

## 2021-10-06 MED ORDER — ACETAMINOPHEN 325 MG PO TABS
650.0000 mg | ORAL_TABLET | Freq: Once | ORAL | Status: AC
  Administered 2021-10-06: 650 mg via ORAL

## 2021-10-06 MED ORDER — SODIUM CHLORIDE 0.9 % IV SOLN: Freq: Once | INTRAVENOUS | Status: AC

## 2021-10-06 MED ORDER — DIPHENHYDRAMINE HCL 25 MG PO CAPS
50.0000 mg | ORAL_CAPSULE | Freq: Once | ORAL | Status: AC
  Administered 2021-10-06: 50 mg via ORAL

## 2021-10-06 NOTE — Progress Notes (Signed)
? ? ?HEMATOLOGY/ONCOLOGY CLINIC NOTE ? ?Date of Service: 10/06/2021 ? ? ?Patient Care Team: ?[name], MD as PCP - General (Internal Medicine) ?[name], MD (Inactive) as Consulting Physician (Hematology and Oncology) ? ?CHIEF COMPLAINTS/PURPOSE OF CONSULTATION:  ?Patient is here for continued evaluation and management of his relapsed multiple myeloma. ? ?Oncologic History:  ?76 y.o. male with diagnosis of IgA lambda active multiple myeloma, ISS Stage I. Active disease diagnosed based on presence of anemia, kidney insufficiency, and paraproteinemia with significant predominance of lambda light chains as well as significant elevation of IgA. Bone marrow biopsy confirmed presence of atypical monoclonal plasma cell process in the bone marrow comprising at least 7% of the cellularity. Based on the findings, patient was started on treatment with lenalidomide, bortezomib, and low-dose dexamethasone based on the anticipated tolerance by the patient. Lenalidomide was started at 10 mg daily based on creatinine clearance of 42, dexamethasone dose was reduced to 20 mg weekly based on patient's age. Treatment was complicated by rapid development of cutaneous rash attributed to lenalidomide, inaddition to decreased renal function and now patient has persistent severe anemia. Side-effects were attributed to Lenalidomide and lenalidomide was discontinued with subsequent recovery.  Patient has been receiving bortezomib weekly with low-dose dexamethasone in 4-day cycles. ?  ?Patient has completed induction systemic therapy for his disease with repeat bone marrow biopsy confirming complete response including no evidence of minimal residual disease by cytogenetics or FISH. ? ?HISTORY OF PRESENTING ILLNESS:  ?Please see previous note for details of initial presentation. ? ?INTERVAL HISTORY:  ?[name] is a 76 y.o. male here for continued evaluation and management of his relapsed multiple myeloma prior to  cycle 3-day 1 of daratumumab immunotherapy.  ? ?He reports that he is experiencing some grade 1 diarrhea from the Daratumumab. He further notes no other significant toxicities with Daratumumab. ? ?He says that PCP found something in urine. We discussed the reasoning for taking Lisinopril. ? ?Labs today shows CBC with a hemoglobin of 12.8 with normal WBC count and platelets ?CMP stable except blood sugar of 153 and is being managed by [name], MD ?Multiple Myeloma panel showed M spike 0.4g/dL ?serum kappa lambda free light chain ratio within normal limits. ? ? ?MEDICAL HISTORY:  ?Past Medical History:  ?Diagnosis Date  ? Anemia   ? Arthritis   ? shoulders,feet   ? Cataract   ? per eye dr -has appt 5-11  ? CKD (chronic kidney disease), stage III ([place])   ? Elevated PSA   ? History of ketoacidosis   ? 03-29-2015   ? History of sepsis   ? 07-25-2013  non-compliant w/ medication  ? Hyperlipidemia   ? Hypertension   ? Nocturia   ? Peripheral neuropathy   ? Prostate cancer ([place])   ? Type 2 diabetes mellitus with insulin therapy ([place])   ? ? ?SURGICAL HISTORY: ?Past Surgical History:  ?Procedure Laterality Date  ? CIRCUMCISION  1990's  ? PROSTATE BIOPSY N/A 12/21/2015  ? Procedure: BIOPSY TRANSRECTAL ULTRASONIC PROSTATE (TUBP);  Surgeon: Festus Aloe, MD;  Location: [facility];  Service: Urology;  Laterality: N/A;  ? ? ?SOCIAL HISTORY: ?Social History  ? ?Socioeconomic History  ? Marital status: Single  ?  Spouse name: Not on file  ? Number of children: Not on file  ? Years of education: Not on file  ? Highest education level: Not on file  ?Occupational History  ? Not on file  ?Tobacco Use  ?  Smoking status: Every Day  ?  Packs/day: 0.50  ?  Years: 50.00  ?  Pack years: 25.00  ?  Types: Cigarettes  ? Smokeless tobacco: Never  ? Tobacco comments:  ?  1 ppwk-4-5 a day   ?Vaping Use  ? Vaping Use: Never used  ?Substance and Sexual Activity  ? Alcohol use: Yes  ?  Alcohol/week: 1.0 standard drink  ?   Types: 1 Cans of beer per week  ?  Comment: 1 every day-quit couple weeks ago  ? Drug use: No  ? Sexual activity: Not Currently  ?Other Topics Concern  ? Not on file  ?Social History Narrative  ? Not on file  ? ?Social Determinants of Health  ? ?Financial Resource Strain: Not on file  ?Food Insecurity: Not on file  ?Transportation Needs: Not on file  ?Physical Activity: Not on file  ?Stress: Not on file  ?Social Connections: Not on file  ?Intimate Partner Violence: Not on file  ? ? ?FAMILY HISTORY: ?Family History  ?Problem Relation Age of Onset  ? Kidney disease Mother   ? Cancer Neg Hx   ? Colon cancer Neg Hx   ? Colon polyps Neg Hx   ? Esophageal cancer Neg Hx   ? Rectal cancer Neg Hx   ? Stomach cancer Neg Hx   ? ? ?ALLERGIES:  is allergic to other. ? ?MEDICATIONS:  ?Current Outpatient Medications  ?Medication Sig Dispense Refill  ? Accu-Chek Softclix Lancets lancets Use to test blood sugar 3 times daily. 100 each 3  ? acyclovir (ZOVIRAX) 400 MG tablet TAKE 1 TABLET (400 MG TOTAL) BY MOUTH 2 (TWO) TIMES DAILY. 60 tablet 5  ? acyclovir (ZOVIRAX) 400 MG tablet Take 1 tablet (400 mg total) by mouth 2 (two) times daily. 60 tablet 11  ? amLODipine (NORVASC) 10 MG tablet TAKE 1 TABLET (10 MG TOTAL) BY MOUTH DAILY. 90 tablet 3  ? aspirin EC 81 MG tablet Take 1 tablet (81 mg total) by mouth daily. 90 tablet 3  ? b complex vitamins capsule Take 1 capsule by mouth daily. 30 capsule 5  ? Blood Glucose Monitoring Suppl (ACCU-CHEK GUIDE) w/Device KIT Use to test blood sugar three times daily 1 kit 0  ? buPROPion (WELLBUTRIN SR) 150 MG 12 hr tablet     ? Continuous Blood Gluc Receiver (FREESTYLE LIBRE READER) DEVI Use to check blood sugar 3x daily. E11.42 1 each 0  ? Continuous Blood Gluc Sensor (FREESTYLE LIBRE SENSOR SYSTEM) MISC Use to check blood sugar 3x daily. Change sensor Q 2 wks. E11.42 2 each 12  ? dexamethasone (DECADRON) 4 MG tablet Take 5 tablets (20 mg total) by mouth daily. Take the day after daratumumab. Take  with breakfast 20 tablet 11  ? diclofenac Sodium (VOLTAREN) 1 % GEL Apply 4 g topically 4 (four) times daily. 150 g 5  ? donepezil (ARICEPT) 5 MG tablet TAKE 1 TABLET (5 MG TOTAL) BY MOUTH AT BEDTIME. FOR MEMORY ISSUES 30 tablet 5  ? ferrous sulfate 325 (65 FE) MG tablet Take 325 mg by mouth daily.    ? gabapentin (NEURONTIN) 300 MG capsule TAKE 1 CAPSULE BY MOUTH IN THE MORNING AND LUNCH, AND 2 AT BEDTIME 120 capsule 6  ? glucose blood (ACCU-CHEK GUIDE) test strip Use to test blood sugar 3 times daily. 100 each 2  ? hydrOXYzine (ATARAX/VISTARIL) 25 MG tablet TAKE 1 TABLET (25 MG TOTAL) BY MOUTH 3 (THREE) TIMES DAILY AS NEEDED FOR ITCHING (RASH). 30 tablet  0  ? Insulin Lispro Prot & Lispro (HUMALOG MIX 75/25 KWIKPEN) (75-25) 100 UNIT/ML Kwikpen Take 6 units subcu twice a day with meals.  For 3 days after you take dexamethasone, increase the dose to 9 units twice a day with meals. 15 mL 2  ? Insulin Pen Needle (BD PEN NEEDLE NANO U/F) 32G X 4 MM MISC USE AS DIRECTED TWICE DAILY WITH INSULIN 100 each 6  ? lidocaine (LIDODERM) 5 %     ? lisinopril (ZESTRIL) 2.5 MG tablet Take 1 tablet (2.5 mg total) by mouth daily. 30 tablet 4  ? Miconazole Nitrate (ATHLETES FOOT POWDER SPRAY) 2 % AERP Spray between toes and under toes once daily. 130 g 4  ? Multiple Vitamin (MULTIVITAMIN WITH MINERALS) TABS tablet Take 1 tablet by mouth daily. 60 tablet 2  ? ondansetron (ZOFRAN) 8 MG tablet Take 1 tablet (8 mg total) by mouth 2 (two) times daily as needed (Nausea or vomiting). 30 tablet 1  ? pravastatin (PRAVACHOL) 20 MG tablet TAKE 1 TABLET (20 MG TOTAL) BY MOUTH DAILY. 90 tablet 2  ? prochlorperazine (COMPAZINE) 10 MG tablet Take 1 tablet (10 mg total) by mouth every 6 (six) hours as needed (Nausea or vomiting). 30 tablet 1  ? tadalafil (CIALIS) 20 MG tablet Take 20 mg by mouth as needed.    ? tiZANidine (ZANAFLEX) 2 MG tablet Take 1 tablet (2 mg total) by mouth at bedtime as needed for muscle spasms. 30 tablet 1  ? vitamin B-12  (CYANOCOBALAMIN) 100 MCG tablet Take 100 mcg by mouth daily.    ? Vitamins/Minerals TABS Take by mouth.    ? ?No current facility-administered medications for this visit.  ? ? ?10 Point review of Systems

## 2021-10-06 NOTE — Patient Instructions (Signed)
[facility]COLOG[facility]  Discharge Instructions: °Thank you for choosing [facility] to provide your oncology and hematology care.  ° °If you have a lab appointment with the Cancer Center, please go directly to the Cancer Center and check in at the registration area. °  °Wear comfortable clothing and clothing appropriate for easy access to any Portacath or PICC line.  ° °We strive to give you quality time with your provider. You may need to reschedule your appointment if you arrive late (15 or more minutes).  Arriving late affects you and other patients whose appointments are after yours.  Also, if you miss three or more appointments without notifying the office, you may be dismissed from the clinic at the provider’s discretion.    °  °For prescription refill requests, have your pharmacy contact our office and allow 72 hours for refills to be completed.   ° °Today you received the following chemotherapy and/or immunotherapy agents: Darzalex  °  °To help prevent nausea and vomiting after your treatment, we encourage you to take your nausea medication as directed. ° °BELOW ARE SYMPTOMS THAT SHOULD BE REPORTED IMMEDIATELY: °*FEVER GREATER THAN 100.4 F (38 °C) OR HIGHER °*CHILLS OR SWEATING °*NAUSEA AND VOMITING THAT IS NOT CONTROLLED WITH YOUR NAUSEA MEDICATION °*UNUSUAL SHORTNESS OF BREATH °*UNUSUAL BRUISING OR BLEEDING °*URINARY PROBLEMS (pain or burning when urinating, or frequent urination) °*BOWEL PROBLEMS (unusual diarrhea, constipation, pain near the anus) °TENDERNESS IN MOUTH AND THROAT WITH OR WITHOUT PRESENCE OF ULCERS (sore throat, sores in mouth, or a toothache) °UNUSUAL RASH, SWELLING OR PAIN  °UNUSUAL VAGINAL DISCHARGE OR ITCHING  ° °Items with * indicate a potential emergency and should be followed up as soon as possible or go to the Emergency Department if any problems should occur. ° °Please show the CHEMOTHERAPY ALERT CARD or IMMUNOTHERAPY ALERT CARD at check-in to the  Emergency Department and triage nurse. ° °Should you have questions after your visit or need to cancel or reschedule your appointment, please contact [facility]  Dept: [phone]  and follow the prompts.  Office hours are 8:00 a.m. to 4:30 p.m. Monday - Friday. Please note that voicemails left after 4:00 p.m. may not be returned until the following business day.  We are closed weekends and major holidays. You have access to a nurse at all times for urgent questions. Please call the main number to the clinic Dept: [phone] and follow the prompts. ° ° °For any non-urgent questions, you may also contact your provider using MyChart. We now offer e-Visits for anyone 18 and older to request care online for non-urgent symptoms. For details visit [email or web address]. °  °Also download the MyChart app! Go to the app store, search "MyChart", open the app, select , and log in with your MyChart username and password. ° °Due to Covid, a mask is required upon entering the hospital/clinic. If you do not have a mask, one will be given to you upon arrival. For doctor visits, patients may have 1 support person aged 18 or older with them. For treatment visits, patients cannot have anyone with them due to current Covid guidelines and our immunocompromised population.  ° °

## 2021-10-07 ENCOUNTER — Other Ambulatory Visit: Payer: Self-pay

## 2021-10-07 LAB — KAPPA/LAMBDA LIGHT CHAINS
Kappa free light chain: 30.2 mg/L — ABNORMAL HIGH (ref 3.3–19.4)
Kappa, lambda light chain ratio: 0.65 (ref 0.26–1.65)
Lambda free light chains: 46.3 mg/L — ABNORMAL HIGH (ref 5.7–26.3)

## 2021-10-10 ENCOUNTER — Ambulatory Visit: Payer: Medicare HMO | Attending: Internal Medicine | Admitting: Pharmacist

## 2021-10-10 ENCOUNTER — Encounter: Payer: Self-pay | Admitting: Pharmacist

## 2021-10-10 VITALS — BP 133/72 | HR 76 | Temp 98.6°F | Ht 67.0 in | Wt 167.6 lb

## 2021-10-10 DIAGNOSIS — Z0000 Encounter for general adult medical examination without abnormal findings: Secondary | ICD-10-CM

## 2021-10-10 LAB — MULTIPLE MYELOMA PANEL, SERUM
Albumin SerPl Elph-Mcnc: 3.7 g/dL (ref 2.9–4.4)
Albumin/Glob SerPl: 1.8 — ABNORMAL HIGH (ref 0.7–1.7)
Alpha 1: 0.2 g/dL (ref 0.0–0.4)
Alpha2 Glob SerPl Elph-Mcnc: 0.7 g/dL (ref 0.4–1.0)
B-Globulin SerPl Elph-Mcnc: 0.8 g/dL (ref 0.7–1.3)
Gamma Glob SerPl Elph-Mcnc: 0.4 g/dL (ref 0.4–1.8)
Globulin, Total: 2.1 g/dL — ABNORMAL LOW (ref 2.2–3.9)
IgA: 176 mg/dL (ref 61–437)
IgG (Immunoglobin G), Serum: 507 mg/dL — ABNORMAL LOW (ref 603–1613)
IgM (Immunoglobulin M), Srm: 40 mg/dL (ref 15–143)
M Protein SerPl Elph-Mcnc: 0.4 g/dL — ABNORMAL HIGH
Total Protein ELP: 5.8 g/dL — ABNORMAL LOW (ref 6.0–8.5)

## 2021-10-10 NOTE — Progress Notes (Signed)
? ?Subjective:  ? Robert Sparks is a 76 y.o. male who presents for Medicare Annual/Subsequent preventive examination. ? ?   ?Objective:  ?  ?Today's Vitals  ? 10/10/21 5784 10/10/21 0948  ?BP: 133/72   ?Pulse: 76   ?Temp: 98.6 ?F (37 ?C)   ?Weight: 167 lb 9.6 oz (76 kg)   ?Height: _0  (1.702 m)   ?PainSc: 0-No pain 0-No pain  ? ?Body mass index is 26.25 kg/m?. ? ? ?  10/10/2021  ?  9:59 AM 10/06/2021  ? 11:13 AM 08/19/2021  ?  2:17 PM 07/21/2021  ? 11:50 AM 06/13/2021  ?  9:43 AM 05/25/2020  ?  8:48 AM 08/18/2019  ?  3:10 PM  ?Advanced Directives  ?Does Patient Have a Medical Advance Directive? Yes _1  No  ?Type of Scientist, forensic Power of Attorney        ?Does patient want to make changes to medical advance directive? No - Patient declined        ?Copy of [place] in Chart? No - copy requested        ?Would patient like information on creating a medical advance directive? No - Patient declined No - Patient declined No - Patient declined No - Patient declined  No - Patient declined No - Patient declined  ? ? ?Current Medications (verified) ?Outpatient Encounter Medications as of 10/10/2021  ?Medication Sig  ? Accu-Chek Softclix Lancets lancets Use to test blood sugar 3 times daily.  ? acyclovir (ZOVIRAX) 400 MG tablet Take 1 tablet (400 mg total) by mouth 2 (two) times daily.  ? amLODipine (NORVASC) 10 MG tablet TAKE 1 TABLET (10 MG TOTAL) BY MOUTH DAILY.  ? b complex vitamins capsule Take 1 capsule by mouth daily.  ? Blood Glucose Monitoring Suppl (ACCU-CHEK GUIDE) w/Device KIT Use to test blood sugar three times daily  ? Continuous Blood Gluc Receiver (FREESTYLE LIBRE READER) DEVI Use to check blood sugar 3x daily. E11.42  ? Continuous Blood Gluc Sensor (FREESTYLE LIBRE SENSOR SYSTEM) MISC Use to check blood sugar 3x daily. Change sensor Q 2 wks. E11.42  ? dexamethasone (DECADRON) 4 MG tablet 63m (5 tabs) Take the day after each dose of daratumumab. Take with breakfast  ?  diclofenac Sodium (VOLTAREN) 1 % GEL Apply 4 g topically 4 (four) times daily.  ? donepezil (ARICEPT) 5 MG tablet TAKE 1 TABLET (5 MG TOTAL) BY MOUTH AT BEDTIME. FOR MEMORY ISSUES  ? ferrous sulfate 325 (65 FE) MG tablet Take 325 mg by mouth daily.  ? gabapentin (NEURONTIN) 300 MG capsule TAKE 1 CAPSULE BY MOUTH IN THE MORNING AND LUNCH, AND 2 AT BEDTIME  ? glucose blood (ACCU-CHEK GUIDE) test strip Use to test blood sugar 3 times daily.  ? hydrOXYzine (ATARAX/VISTARIL) 25 MG tablet TAKE 1 TABLET (25 MG TOTAL) BY MOUTH 3 (THREE) TIMES DAILY AS NEEDED FOR ITCHING (RASH).  ? Insulin Lispro Prot & Lispro (HUMALOG MIX 75/25 KWIKPEN) (75-25) 100 UNIT/ML Kwikpen Take 6 units subcu twice a day with meals.  For 3 days after you take dexamethasone, increase the dose to 9 units twice a day with meals.  ? Insulin Pen Needle (BD PEN NEEDLE NANO U/F) 32G X 4 MM MISC USE AS DIRECTED TWICE DAILY WITH INSULIN  ? lidocaine (LIDODERM) 5 %   ? lisinopril (ZESTRIL) 2.5 MG tablet Take 1 tablet (2.5 mg total) by mouth daily.  ? Miconazole Nitrate (ATHLETES FOOT POWDER SPRAY) 2 %  AERP Spray between toes and under toes once daily.  ? Multiple Vitamin (MULTIVITAMIN WITH MINERALS) TABS tablet Take 1 tablet by mouth daily.  ? ondansetron (ZOFRAN) 8 MG tablet Take 1 tablet (8 mg total) by mouth 2 (two) times daily as needed (Nausea or vomiting).  ? pravastatin (PRAVACHOL) 20 MG tablet TAKE 1 TABLET (20 MG TOTAL) BY MOUTH DAILY.  ? prochlorperazine (COMPAZINE) 10 MG tablet Take 1 tablet (10 mg total) by mouth every 6 (six) hours as needed (Nausea or vomiting).  ? tadalafil (CIALIS) 20 MG tablet Take 20 mg by mouth as needed.  ? tiZANidine (ZANAFLEX) 2 MG tablet Take 1 tablet (2 mg total) by mouth at bedtime as needed for muscle spasms.  ? vitamin B-12 (CYANOCOBALAMIN) 100 MCG tablet Take 100 mcg by mouth daily.  ? [DISCONTINUED] acyclovir (ZOVIRAX) 400 MG tablet TAKE 1 TABLET (400 MG TOTAL) BY MOUTH 2 (TWO) TIMES DAILY.  ? [DISCONTINUED]  aspirin EC 81 MG tablet Take 1 tablet (81 mg total) by mouth daily.  ? [DISCONTINUED] buPROPion (WELLBUTRIN SR) 150 MG 12 hr tablet  (Patient not taking: Reported on 10/10/2021)  ? [DISCONTINUED] Vitamins/Minerals TABS Take by mouth.  ? ?No facility-administered encounter medications on file as of 10/10/2021.  ? ? ?Allergies (verified) ?Other  ? ?History: ?Past Medical History:  ?Diagnosis Date  ? Anemia   ? Arthritis   ? shoulders,feet   ? Cataract   ? per eye dr -has appt 5-11  ? CKD (chronic kidney disease), stage III ([place])   ? Elevated PSA   ? History of ketoacidosis   ? 03-29-2015   ? History of sepsis   ? 07-25-2013  non-compliant w/ medication  ? Hyperlipidemia   ? Hypertension   ? Nocturia   ? Peripheral neuropathy   ? Prostate cancer ([place])   ? Type 2 diabetes mellitus with insulin therapy ([place])   ? ?Past Surgical History:  ?Procedure Laterality Date  ? CIRCUMCISION  1990's  ? PROSTATE BIOPSY N/A 12/21/2015  ? Procedure: BIOPSY TRANSRECTAL ULTRASONIC PROSTATE (TUBP);  Surgeon: Festus Aloe, MD;  Location: [facility];  Service: Urology;  Laterality: N/A;  ? ?Family History  ?Problem Relation Age of Onset  ? Kidney disease Mother   ? Cancer Neg Hx   ? Colon cancer Neg Hx   ? Colon polyps Neg Hx   ? Esophageal cancer Neg Hx   ? Rectal cancer Neg Hx   ? Stomach cancer Neg Hx   ? ?Social History  ? ?Socioeconomic History  ? Marital status: Single  ?  Spouse name: Not on file  ? Number of children: Not on file  ? Years of education: Not on file  ? Highest education level: Not on file  ?Occupational History  ? Not on file  ?Tobacco Use  ? Smoking status: Every Day  ?  Packs/day: 0.50  ?  Years: 50.00  ?  Pack years: 25.00  ?  Types: Cigarettes  ? Smokeless tobacco: Never  ? Tobacco comments:  ?  1 ppwk-4-5 a day   ?Vaping Use  ? Vaping Use: Never used  ?Substance and Sexual Activity  ? Alcohol use: Yes  ?  Alcohol/week: 1.0 standard drink  ?  Types: 1 Cans of beer per week  ?  Comment: 1 every  day-quit couple weeks ago  ? Drug use: No  ? Sexual activity: Not Currently  ?Other Topics Concern  ? Not on file  ?Social History Narrative  ? Not  on file  ? ?Social Determinants of Health  ? ?Financial Resource Strain: Not on file  ?Food Insecurity: Not on file  ?Transportation Needs: Not on file  ?Physical Activity: Not on file  ?Stress: Not on file  ?Social Connections: Not on file  ? ? ?Tobacco Counseling ?Ready to quit: Not Answered ?Counseling given: Not Answered ?Tobacco comments: 1 ppwk-4-5 a day  ? ? ?Clinical Intake: ? ?Pre-visit preparation completed: No ? ?Pain : No/denies pain ?Pain Score: 0-No pain ? ?  ? ?Nutritional Risks: None ?Diabetes: Yes ?CBG done?: No ?CBG resulted in Enter/ Edit results?: No ?Did pt. bring in CBG monitor from home?: No ? ?How often do you need to have someone help you when you read instructions, pamphlets, or other written materials from your doctor or pharmacy?: 1 - Never ?What is the last grade level you completed in school?: 10th ? ?Diabetic? Yes ? ?Interpreter Needed?: No ? ?Information entered by :: [facility] ? ? ?Activities of Daily Living ? ?  10/10/2021  ? 10:01 AM 06/13/2021  ?  9:32 AM  ?In your present state of health, do you have any difficulty performing the following activities:  ?Hearing? 0 0  ?Vision? 1 0  ?Comment Sees Dr. Katy Fitch   ?Difficulty concentrating or making decisions? 1 0  ?Comment Takes Aricept   ?Walking or climbing stairs? 0 0  ?Dressing or bathing? 0 0  ?Doing errands, shopping? 0   ?Preparing Food and eating ? N   ?Using the Toilet? N   ?In the past six months, have you accidently leaked urine? N   ?Do you have problems with loss of bowel control? N   ?Managing your Medications? N   ?Managing your Finances? N   ?Housekeeping or managing your Housekeeping? N   ? ? ?Patient Care Team: ?[name], MD as PCP - General (Internal Medicine) ?[name], MD (Inactive) as Consulting Physician (Hematology and Oncology) ? ?Indicate any recent  Medical Services you may have received from other than [facility] providers in the past year (date may be approximate). ? ?   ?Assessment:  ? This is a routine wellness examination for [facility]. ? ?Hearing/Vision screen ?No re

## 2021-10-11 ENCOUNTER — Telehealth: Payer: Self-pay | Admitting: Hematology

## 2021-10-11 NOTE — Telephone Encounter (Signed)
Unable to leave message with follow-up appointments per 3/30 los. Mailed calendar. ?

## 2021-10-12 ENCOUNTER — Other Ambulatory Visit: Payer: Self-pay | Admitting: Internal Medicine

## 2021-10-12 ENCOUNTER — Other Ambulatory Visit: Payer: Self-pay

## 2021-10-12 MED ORDER — ACCU-CHEK GUIDE VI STRP
ORAL_STRIP | 2 refills | Status: DC
  Filled 2021-10-12: qty 100, 33d supply, fill #0
  Filled 2021-11-02 – 2021-11-09 (×2): qty 100, 33d supply, fill #1
  Filled 2021-12-02 – 2021-12-07 (×3): qty 100, 33d supply, fill #2

## 2021-10-13 ENCOUNTER — Other Ambulatory Visit: Payer: Self-pay

## 2021-10-13 ENCOUNTER — Telehealth: Payer: Self-pay

## 2021-10-13 ENCOUNTER — Other Ambulatory Visit: Payer: Self-pay | Admitting: Pharmacist

## 2021-10-13 ENCOUNTER — Encounter: Payer: Self-pay | Admitting: Hematology

## 2021-10-13 DIAGNOSIS — E1142 Type 2 diabetes mellitus with diabetic polyneuropathy: Secondary | ICD-10-CM

## 2021-10-13 MED ORDER — NOVOLOG MIX 70/30 FLEXPEN (70-30) 100 UNIT/ML ~~LOC~~ SUPN
PEN_INJECTOR | SUBCUTANEOUS | 11 refills | Status: DC
  Filled 2021-10-13: qty 15, 84d supply, fill #0
  Filled 2021-12-13: qty 15, 75d supply, fill #0

## 2021-10-13 MED ORDER — NOVOLOG MIX 70/30 FLEXPEN (70-30) 100 UNIT/ML ~~LOC~~ SUPN
PEN_INJECTOR | SUBCUTANEOUS | 2 refills | Status: DC
  Filled 2021-10-13: qty 9, 75d supply, fill #0

## 2021-10-13 NOTE — Telephone Encounter (Signed)
Insurance prefers Novolog Mix 70/30 or Novolin 70/30.  If appropriate, can his Humalog Mix 75/25 be changed to the Novolog Mix 70/30? ?

## 2021-10-14 ENCOUNTER — Other Ambulatory Visit: Payer: Self-pay | Admitting: Hematology

## 2021-10-17 ENCOUNTER — Other Ambulatory Visit: Payer: Self-pay

## 2021-10-19 ENCOUNTER — Other Ambulatory Visit: Payer: Self-pay

## 2021-10-19 DIAGNOSIS — C9002 Multiple myeloma in relapse: Secondary | ICD-10-CM

## 2021-10-20 ENCOUNTER — Inpatient Hospital Stay: Payer: Medicare HMO | Attending: Hematology

## 2021-10-20 ENCOUNTER — Ambulatory Visit: Payer: Medicare HMO

## 2021-10-20 ENCOUNTER — Inpatient Hospital Stay: Payer: Medicare HMO

## 2021-10-20 ENCOUNTER — Other Ambulatory Visit: Payer: Self-pay

## 2021-10-20 ENCOUNTER — Other Ambulatory Visit: Payer: Medicare HMO

## 2021-10-20 VITALS — BP 144/76 | HR 83 | Temp 98.1°F | Resp 17 | Wt 167.5 lb

## 2021-10-20 DIAGNOSIS — Z5112 Encounter for antineoplastic immunotherapy: Secondary | ICD-10-CM | POA: Insufficient documentation

## 2021-10-20 DIAGNOSIS — Z7189 Other specified counseling: Secondary | ICD-10-CM

## 2021-10-20 DIAGNOSIS — C9002 Multiple myeloma in relapse: Secondary | ICD-10-CM | POA: Diagnosis not present

## 2021-10-20 LAB — CBC WITH DIFFERENTIAL (CANCER CENTER ONLY)
Abs Immature Granulocytes: 0.02 10*3/uL (ref 0.00–0.07)
Basophils Absolute: 0 10*3/uL (ref 0.0–0.1)
Basophils Relative: 1 %
Eosinophils Absolute: 0.3 10*3/uL (ref 0.0–0.5)
Eosinophils Relative: 6 %
HCT: 38.5 % — ABNORMAL LOW (ref 39.0–52.0)
Hemoglobin: 13.3 g/dL (ref 13.0–17.0)
Immature Granulocytes: 0 %
Lymphocytes Relative: 21 %
Lymphs Abs: 1.3 10*3/uL (ref 0.7–4.0)
MCH: 34.8 pg — ABNORMAL HIGH (ref 26.0–34.0)
MCHC: 34.5 g/dL (ref 30.0–36.0)
MCV: 100.8 fL — ABNORMAL HIGH (ref 80.0–100.0)
Monocytes Absolute: 0.4 10*3/uL (ref 0.1–1.0)
Monocytes Relative: 6 %
Neutro Abs: 4.1 10*3/uL (ref 1.7–7.7)
Neutrophils Relative %: 66 %
Platelet Count: 217 10*3/uL (ref 150–400)
RBC: 3.82 MIL/uL — ABNORMAL LOW (ref 4.22–5.81)
RDW: 13 % (ref 11.5–15.5)
WBC Count: 6.2 10*3/uL (ref 4.0–10.5)
nRBC: 0 % (ref 0.0–0.2)

## 2021-10-20 LAB — CMP (CANCER CENTER ONLY)
ALT: 27 U/L (ref 0–44)
AST: 23 U/L (ref 15–41)
Albumin: 4.1 g/dL (ref 3.5–5.0)
Alkaline Phosphatase: 64 U/L (ref 38–126)
Anion gap: 9 (ref 5–15)
BUN: 12 mg/dL (ref 8–23)
CO2: 24 mmol/L (ref 22–32)
Calcium: 9.1 mg/dL (ref 8.9–10.3)
Chloride: 105 mmol/L (ref 98–111)
Creatinine: 1.2 mg/dL (ref 0.61–1.24)
GFR, Estimated: >60 mL/min (ref >60)
Glucose, Bld: 166 mg/dL — ABNORMAL HIGH (ref 70–99)
Potassium: 3.6 mmol/L (ref 3.5–5.1)
Sodium: 138 mmol/L (ref 135–145)
Total Bilirubin: 0.8 mg/dL (ref 0.3–1.2)
Total Protein: 6.7 g/dL (ref 6.5–8.1)

## 2021-10-20 MED ORDER — FAMOTIDINE IN NACL 20-0.9 MG/50ML-% IV SOLN
20.0000 mg | Freq: Once | INTRAVENOUS | Status: AC
  Administered 2021-10-20: 20 mg via INTRAVENOUS
  Filled 2021-10-20: qty 50

## 2021-10-20 MED ORDER — SODIUM CHLORIDE 0.9 % IV SOLN: Freq: Once | INTRAVENOUS | Status: AC

## 2021-10-20 MED ORDER — ACETAMINOPHEN 325 MG PO TABS
650.0000 mg | ORAL_TABLET | Freq: Once | ORAL | Status: AC
  Administered 2021-10-20: 650 mg via ORAL
  Filled 2021-10-20: qty 2

## 2021-10-20 MED ORDER — SODIUM CHLORIDE 0.9 % IV SOLN
20.0000 mg | Freq: Once | INTRAVENOUS | Status: AC
  Administered 2021-10-20: 20 mg via INTRAVENOUS
  Filled 2021-10-20: qty 20

## 2021-10-20 MED ORDER — DIPHENHYDRAMINE HCL 25 MG PO CAPS
50.0000 mg | ORAL_CAPSULE | Freq: Once | ORAL | Status: AC
  Administered 2021-10-20: 50 mg via ORAL
  Filled 2021-10-20: qty 2

## 2021-10-20 MED ORDER — SODIUM CHLORIDE 0.9 % IV SOLN
16.0000 mg/kg | Freq: Once | INTRAVENOUS | Status: AC
  Administered 2021-10-20: 1200 mg via INTRAVENOUS
  Filled 2021-10-20: qty 60

## 2021-10-20 NOTE — Patient Instructions (Signed)
[facility]  Discharge Instructions: °Thank you for choosing [facility] to provide your oncology and hematology care.  ° °If you have a lab appointment with the Cancer Center, please go directly to the Cancer Center and check in at the registration area. °  °Wear comfortable clothing and clothing appropriate for easy access to any Portacath or PICC line.  ° °We strive to give you quality time with your provider. You may need to reschedule your appointment if you arrive late (15 or more minutes).  Arriving late affects you and other patients whose appointments are after yours.  Also, if you miss three or more appointments without notifying the office, you may be dismissed from the clinic at the provider’s discretion.    °  °For prescription refill requests, have your pharmacy contact our office and allow 72 hours for refills to be completed.   ° °Today you received the following chemotherapy and/or immunotherapy agents: Darzalex  °  °To help prevent nausea and vomiting after your treatment, we encourage you to take your nausea medication as directed. ° °BELOW ARE SYMPTOMS THAT SHOULD BE REPORTED IMMEDIATELY: °*FEVER GREATER THAN 100.4 F (38 °C) OR HIGHER °*CHILLS OR SWEATING °*NAUSEA AND VOMITING THAT IS NOT CONTROLLED WITH YOUR NAUSEA MEDICATION °*UNUSUAL SHORTNESS OF BREATH °*UNUSUAL BRUISING OR BLEEDING °*URINARY PROBLEMS (pain or burning when urinating, or frequent urination) °*BOWEL PROBLEMS (unusual diarrhea, constipation, pain near the anus) °TENDERNESS IN MOUTH AND THROAT WITH OR WITHOUT PRESENCE OF ULCERS (sore throat, sores in mouth, or a toothache) °UNUSUAL RASH, SWELLING OR PAIN  °UNUSUAL VAGINAL DISCHARGE OR ITCHING  ° °Items with * indicate a potential emergency and should be followed up as soon as possible or go to the Emergency Department if any problems should occur. ° °Please show the CHEMOTHERAPY ALERT CARD or IMMUNOTHERAPY ALERT CARD at check-in to the  Emergency Department and triage nurse. ° °Should you have questions after your visit or need to cancel or reschedule your appointment, please contact [facility]  Dept: [phone]  and follow the prompts.  Office hours are 8:00 a.m. to 4:30 p.m. Monday - Friday. Please note that voicemails left after 4:00 p.m. may not be returned until the following business day.  We are closed weekends and major holidays. You have access to a nurse at all times for urgent questions. Please call the main number to the clinic Dept: [phone] and follow the prompts. ° ° °For any non-urgent questions, you may also contact your provider using MyChart. We now offer e-Visits for anyone 18 and older to request care online for non-urgent symptoms. For details visit [email or web address]. °  °Also download the MyChart app! Go to the app store, search "MyChart", open the app, select [facility], and log in with your MyChart username and password. ° °Due to Covid, a mask is required upon entering the hospital/clinic. If you do not have a mask, one will be given to you upon arrival. For doctor visits, patients may have 1 support person aged 18 or older with them. For treatment visits, patients cannot have anyone with them due to current Covid guidelines and our immunocompromised population.  ° °

## 2021-10-27 ENCOUNTER — Other Ambulatory Visit: Payer: Self-pay

## 2021-10-31 ENCOUNTER — Other Ambulatory Visit: Payer: Self-pay

## 2021-11-01 ENCOUNTER — Other Ambulatory Visit: Payer: Self-pay

## 2021-11-01 ENCOUNTER — Ambulatory Visit (INDEPENDENT_AMBULATORY_CARE_PROVIDER_SITE_OTHER): Payer: Medicare HMO | Admitting: Podiatry

## 2021-11-01 DIAGNOSIS — M79674 Pain in right toe(s): Secondary | ICD-10-CM | POA: Diagnosis not present

## 2021-11-01 DIAGNOSIS — M79675 Pain in left toe(s): Secondary | ICD-10-CM | POA: Diagnosis not present

## 2021-11-01 DIAGNOSIS — E1142 Type 2 diabetes mellitus with diabetic polyneuropathy: Secondary | ICD-10-CM

## 2021-11-01 DIAGNOSIS — C9002 Multiple myeloma in relapse: Secondary | ICD-10-CM

## 2021-11-01 DIAGNOSIS — Q828 Other specified congenital malformations of skin: Secondary | ICD-10-CM

## 2021-11-01 DIAGNOSIS — L84 Corns and callosities: Secondary | ICD-10-CM | POA: Diagnosis not present

## 2021-11-01 DIAGNOSIS — B351 Tinea unguium: Secondary | ICD-10-CM | POA: Diagnosis not present

## 2021-11-01 NOTE — Progress Notes (Signed)
?  Subjective:  ?Patient ID: Robert Sparks, male    DOB: 1945/10/21,  MRN: [id number] ? ?Robert Sparks presents to clinic today for at risk foot care with history of diabetic neuropathy and painful porokeratotic lesion(s) right great toe, callus left foot and painful mycotic toenails that limit ambulation. Painful toenails interfere with ambulation. Aggravating factors include wearing enclosed shoe gear. Pain is relieved with periodic professional debridement. Painful porokeratotic lesions are aggravated when weightbearing with and without shoegear. Pain is relieved with periodic professional debridement. ? ?Patient states blood glucose was 160 mg/dl today.  Patient unaware of last HgA1c. ? ?New problem(s): None.  ? ?PCP is [name], MD , and last visit was August 29, 2021. ? ?Allergies  ?Allergen Reactions  ? Other Other (See Comments)  ?  Nicoderm CQ = Bad Dreams ?Nicotine gum = Bad Dreams   ? ? ?Review of Systems: Negative except as noted in the HPI. ? ?Objective: No changes noted in today's physical examination. ?[name] is a pleasant 76 y.o. male in NAD. AAO X 3. ? ?Vascular Examination: ?CFT immediate b/l LE. Palpable DP/PT pulses b/l LE. Digital hair present b/l. Skin temperature gradient WNL b/l. No pain with calf compression b/l. No edema noted b/l. No cyanosis or clubbing noted b/l LE. ? ?Dermatological Examination: ?Pedal skin warm and supple b/l.  No open wounds b/l. No interdigital macerations. Toenails 1-5 b/l elongated, thickened, discolored with subungual debris. +Tenderness with dorsal palpation of nailplates. Hyperkeratotic lesion(s) noted 1st metatarsal head left foot.  Porokeratotic lesion(s) noted R hallux. No erythema, no edema, no drainage, no fluctuance. ? ?Musculoskeletal Examination: ?Normal muscle strength 5/5 to all lower extremity muscle groups bilaterally. HAV with bunion deformity noted b/l LE.[name] No pain, crepitus or joint limitation noted with ROM b/l LE.   Patient ambulates independently without assistive aids. ? ?Neurological Examination: ?Protective sensation diminished with 10g monofilament b/l. Vibratory sensation intact b/l. ? ? ?  Latest Ref Rng & Units 08/29/2021  ? 10:43 AM 01/04/2021  ?  9:04 AM  ?Hemoglobin A1C  ?Hemoglobin-A1c 0.0 - 7.0 % 8.0   7.2    ? ?Assessment/Plan: ?1. Pain due to onychomycosis of toenails of both feet   ?2. Callus   ?3. Porokeratosis   ?4. Diabetic peripheral neuropathy associated with type 2 diabetes mellitus ([place])   ?  ?-Examined patient. ?-Patient to continue soft, supportive shoe gear daily. ?-Toenails 1-5 b/l were debrided in length and girth with sterile nail nippers and dremel without iatrogenic bleeding.  ?-Callus(es) 1st metatarsal head left foot pared utilizing sterile scalpel blade without incident. Total number debrided =1. ?-Painful porokeratotic lesion(s) right great toe pared and enucleated with sterile scalpel blade. Pinpoint bleeding addressed with Lumicain Hemostatic Solution. Triple antibiotic ointment and band-aid applied. Patient instructed to apply TAO to right great toe once daily for 3 days. Total number of lesions debrided=1. ?-Patient/POA to call should there be question/concern in the interim.  ? ?No follow-ups on file. ? ?Marzetta Board, DPM  ?

## 2021-11-02 ENCOUNTER — Other Ambulatory Visit: Payer: Self-pay

## 2021-11-02 DIAGNOSIS — Z95828 Presence of other vascular implants and grafts: Secondary | ICD-10-CM | POA: Insufficient documentation

## 2021-11-03 ENCOUNTER — Other Ambulatory Visit: Payer: Self-pay

## 2021-11-03 ENCOUNTER — Inpatient Hospital Stay: Payer: Medicare HMO

## 2021-11-03 VITALS — BP 128/70 | HR 78 | Temp 98.0°F | Resp 17 | Wt 164.8 lb

## 2021-11-03 DIAGNOSIS — Z5112 Encounter for antineoplastic immunotherapy: Secondary | ICD-10-CM | POA: Diagnosis not present

## 2021-11-03 DIAGNOSIS — Z7189 Other specified counseling: Secondary | ICD-10-CM

## 2021-11-03 DIAGNOSIS — C9002 Multiple myeloma in relapse: Secondary | ICD-10-CM

## 2021-11-03 LAB — CMP (CANCER CENTER ONLY)
ALT: 40 U/L (ref 0–44)
AST: 27 U/L (ref 15–41)
Albumin: 4 g/dL (ref 3.5–5.0)
Alkaline Phosphatase: 59 U/L (ref 38–126)
Anion gap: 8 (ref 5–15)
BUN: 16 mg/dL (ref 8–23)
CO2: 25 mmol/L (ref 22–32)
Calcium: 9.6 mg/dL (ref 8.9–10.3)
Chloride: 101 mmol/L (ref 98–111)
Creatinine: 1.27 mg/dL — ABNORMAL HIGH (ref 0.61–1.24)
GFR, Estimated: 60 mL/min — ABNORMAL LOW (ref >60)
Glucose, Bld: 223 mg/dL — ABNORMAL HIGH (ref 70–99)
Potassium: 3.8 mmol/L (ref 3.5–5.1)
Sodium: 134 mmol/L — ABNORMAL LOW (ref 135–145)
Total Bilirubin: 0.7 mg/dL (ref 0.3–1.2)
Total Protein: 6.4 g/dL — ABNORMAL LOW (ref 6.5–8.1)

## 2021-11-03 LAB — CBC WITH DIFFERENTIAL (CANCER CENTER ONLY)
Abs Immature Granulocytes: 0.01 10*3/uL (ref 0.00–0.07)
Basophils Absolute: 0 10*3/uL (ref 0.0–0.1)
Basophils Relative: 0 %
Eosinophils Absolute: 0.3 10*3/uL (ref 0.0–0.5)
Eosinophils Relative: 6 %
HCT: 35.5 % — ABNORMAL LOW (ref 39.0–52.0)
Hemoglobin: 12.5 g/dL — ABNORMAL LOW (ref 13.0–17.0)
Immature Granulocytes: 0 %
Lymphocytes Relative: 22 %
Lymphs Abs: 1.2 10*3/uL (ref 0.7–4.0)
MCH: 35.4 pg — ABNORMAL HIGH (ref 26.0–34.0)
MCHC: 35.2 g/dL (ref 30.0–36.0)
MCV: 100.6 fL — ABNORMAL HIGH (ref 80.0–100.0)
Monocytes Absolute: 0.4 10*3/uL (ref 0.1–1.0)
Monocytes Relative: 7 %
Neutro Abs: 3.5 10*3/uL (ref 1.7–7.7)
Neutrophils Relative %: 65 %
Platelet Count: 187 10*3/uL (ref 150–400)
RBC: 3.53 MIL/uL — ABNORMAL LOW (ref 4.22–5.81)
RDW: 12.9 % (ref 11.5–15.5)
WBC Count: 5.5 10*3/uL (ref 4.0–10.5)
nRBC: 0 % (ref 0.0–0.2)

## 2021-11-03 MED ORDER — SODIUM CHLORIDE 0.9 % IV SOLN
20.0000 mg | Freq: Once | INTRAVENOUS | Status: AC
  Administered 2021-11-03: 20 mg via INTRAVENOUS
  Filled 2021-11-03: qty 20

## 2021-11-03 MED ORDER — FAMOTIDINE IN NACL 20-0.9 MG/50ML-% IV SOLN
20.0000 mg | Freq: Once | INTRAVENOUS | Status: AC
  Administered 2021-11-03: 20 mg via INTRAVENOUS
  Filled 2021-11-03: qty 50

## 2021-11-03 MED ORDER — ACETAMINOPHEN 325 MG PO TABS
650.0000 mg | ORAL_TABLET | Freq: Once | ORAL | Status: AC
  Administered 2021-11-03: 650 mg via ORAL
  Filled 2021-11-03: qty 2

## 2021-11-03 MED ORDER — SODIUM CHLORIDE 0.9 % IV SOLN: Freq: Once | INTRAVENOUS | Status: AC

## 2021-11-03 MED ORDER — SODIUM CHLORIDE 0.9 % IV SOLN
16.0000 mg/kg | Freq: Once | INTRAVENOUS | Status: AC
  Administered 2021-11-03: 1200 mg via INTRAVENOUS
  Filled 2021-11-03: qty 60

## 2021-11-03 MED ORDER — DIPHENHYDRAMINE HCL 25 MG PO CAPS
50.0000 mg | ORAL_CAPSULE | Freq: Once | ORAL | Status: AC
  Administered 2021-11-03: 50 mg via ORAL
  Filled 2021-11-03: qty 2

## 2021-11-03 NOTE — Patient Instructions (Signed)
[facility]  Discharge Instructions: °Thank you for choosing [facility] to provide your oncology and hematology care.  ° °If you have a lab appointment with the Cancer Center, please go directly to the Cancer Center and check in at the registration area. °  °Wear comfortable clothing and clothing appropriate for easy access to any Portacath or PICC line.  ° °We strive to give you quality time with your provider. You may need to reschedule your appointment if you arrive late (15 or more minutes).  Arriving late affects you and other patients whose appointments are after yours.  Also, if you miss three or more appointments without notifying the office, you may be dismissed from the clinic at the provider’s discretion.    °  °For prescription refill requests, have your pharmacy contact our office and allow 72 hours for refills to be completed.   ° °Today you received the following chemotherapy and/or immunotherapy agents: Darzalex  °  °To help prevent nausea and vomiting after your treatment, we encourage you to take your nausea medication as directed. ° °BELOW ARE SYMPTOMS THAT SHOULD BE REPORTED IMMEDIATELY: °*FEVER GREATER THAN 100.4 F (38 °C) OR HIGHER °*CHILLS OR SWEATING °*NAUSEA AND VOMITING THAT IS NOT CONTROLLED WITH YOUR NAUSEA MEDICATION °*UNUSUAL SHORTNESS OF BREATH °*UNUSUAL BRUISING OR BLEEDING °*URINARY PROBLEMS (pain or burning when urinating, or frequent urination) °*BOWEL PROBLEMS (unusual diarrhea, constipation, pain near the anus) °TENDERNESS IN MOUTH AND THROAT WITH OR WITHOUT PRESENCE OF ULCERS (sore throat, sores in mouth, or a toothache) °UNUSUAL RASH, SWELLING OR PAIN  °UNUSUAL VAGINAL DISCHARGE OR ITCHING  ° °Items with * indicate a potential emergency and should be followed up as soon as possible or go to the Emergency Department if any problems should occur. ° °Please show the CHEMOTHERAPY ALERT CARD or IMMUNOTHERAPY ALERT CARD at check-in to the  Emergency Department and triage nurse. ° °Should you have questions after your visit or need to cancel or reschedule your appointment, please contact [facility]  Dept: [phone]  and follow the prompts.  Office hours are 8:00 a.m. to 4:30 p.m. Monday - Friday. Please note that voicemails left after 4:00 p.m. may not be returned until the following business day.  We are closed weekends and major holidays. You have access to a nurse at all times for urgent questions. Please call the main number to the clinic Dept: [phone] and follow the prompts. ° ° °For any non-urgent questions, you may also contact your provider using MyChart. We now offer e-Visits for anyone 18 and older to request care online for non-urgent symptoms. For details visit [email or web address]. °  °Also download the MyChart app! Go to the app store, search "MyChart", open the app, select Home, and log in with your MyChart username and password. ° °Due to Covid, a mask is required upon entering the hospital/clinic. If you do not have a mask, one will be given to you upon arrival. For doctor visits, patients may have 1 support person aged 18 or older with them. For treatment visits, patients cannot have anyone with them due to current Covid guidelines and our immunocompromised population.  ° °

## 2021-11-04 LAB — KAPPA/LAMBDA LIGHT CHAINS
Kappa free light chain: 33.4 mg/L — ABNORMAL HIGH (ref 3.3–19.4)
Kappa, lambda light chain ratio: 0.68 (ref 0.26–1.65)
Lambda free light chains: 49.1 mg/L — ABNORMAL HIGH (ref 5.7–26.3)

## 2021-11-07 LAB — MULTIPLE MYELOMA PANEL, SERUM
Albumin SerPl Elph-Mcnc: 3.8 g/dL (ref 2.9–4.4)
Albumin/Glob SerPl: 1.7 (ref 0.7–1.7)
Alpha 1: 0.2 g/dL (ref 0.0–0.4)
Alpha2 Glob SerPl Elph-Mcnc: 0.8 g/dL (ref 0.4–1.0)
B-Globulin SerPl Elph-Mcnc: 0.9 g/dL (ref 0.7–1.3)
Gamma Glob SerPl Elph-Mcnc: 0.4 g/dL (ref 0.4–1.8)
Globulin, Total: 2.3 g/dL (ref 2.2–3.9)
IgA: 168 mg/dL (ref 61–437)
IgG (Immunoglobin G), Serum: 499 mg/dL — ABNORMAL LOW (ref 603–1613)
IgM (Immunoglobulin M), Srm: 37 mg/dL (ref 15–143)
M Protein SerPl Elph-Mcnc: 0.2 g/dL — ABNORMAL HIGH
Total Protein ELP: 6.1 g/dL (ref 6.0–8.5)

## 2021-11-09 ENCOUNTER — Other Ambulatory Visit: Payer: Self-pay

## 2021-11-15 ENCOUNTER — Other Ambulatory Visit: Payer: Self-pay

## 2021-11-15 DIAGNOSIS — C9002 Multiple myeloma in relapse: Secondary | ICD-10-CM

## 2021-11-17 ENCOUNTER — Inpatient Hospital Stay (HOSPITAL_BASED_OUTPATIENT_CLINIC_OR_DEPARTMENT_OTHER): Payer: Medicare HMO | Admitting: Hematology

## 2021-11-17 ENCOUNTER — Inpatient Hospital Stay: Payer: Medicare HMO

## 2021-11-17 ENCOUNTER — Other Ambulatory Visit: Payer: Self-pay

## 2021-11-17 ENCOUNTER — Inpatient Hospital Stay: Payer: Medicare HMO | Attending: Hematology

## 2021-11-17 VITALS — BP 137/79 | HR 77 | Resp 17

## 2021-11-17 VITALS — BP 116/71 | HR 86 | Temp 97.7°F | Resp 18 | Ht 67.0 in | Wt 163.7 lb

## 2021-11-17 DIAGNOSIS — E1122 Type 2 diabetes mellitus with diabetic chronic kidney disease: Secondary | ICD-10-CM | POA: Insufficient documentation

## 2021-11-17 DIAGNOSIS — C9002 Multiple myeloma in relapse: Secondary | ICD-10-CM | POA: Insufficient documentation

## 2021-11-17 DIAGNOSIS — N189 Chronic kidney disease, unspecified: Secondary | ICD-10-CM | POA: Insufficient documentation

## 2021-11-17 DIAGNOSIS — Z5112 Encounter for antineoplastic immunotherapy: Secondary | ICD-10-CM | POA: Diagnosis not present

## 2021-11-17 DIAGNOSIS — G620 Drug-induced polyneuropathy: Secondary | ICD-10-CM | POA: Insufficient documentation

## 2021-11-17 DIAGNOSIS — Z7189 Other specified counseling: Secondary | ICD-10-CM

## 2021-11-17 DIAGNOSIS — I129 Hypertensive chronic kidney disease with stage 1 through stage 4 chronic kidney disease, or unspecified chronic kidney disease: Secondary | ICD-10-CM | POA: Insufficient documentation

## 2021-11-17 LAB — CBC WITH DIFFERENTIAL (CANCER CENTER ONLY)
Abs Immature Granulocytes: 0.01 10*3/uL (ref 0.00–0.07)
Basophils Absolute: 0 10*3/uL (ref 0.0–0.1)
Basophils Relative: 1 %
Eosinophils Absolute: 0.4 10*3/uL (ref 0.0–0.5)
Eosinophils Relative: 6 %
HCT: 36.8 % — ABNORMAL LOW (ref 39.0–52.0)
Hemoglobin: 13.2 g/dL (ref 13.0–17.0)
Immature Granulocytes: 0 %
Lymphocytes Relative: 17 %
Lymphs Abs: 1 10*3/uL (ref 0.7–4.0)
MCH: 35.4 pg — ABNORMAL HIGH (ref 26.0–34.0)
MCHC: 35.9 g/dL (ref 30.0–36.0)
MCV: 98.7 fL (ref 80.0–100.0)
Monocytes Absolute: 0.4 10*3/uL (ref 0.1–1.0)
Monocytes Relative: 8 %
Neutro Abs: 4 10*3/uL (ref 1.7–7.7)
Neutrophils Relative %: 68 %
Platelet Count: 196 10*3/uL (ref 150–400)
RBC: 3.73 MIL/uL — ABNORMAL LOW (ref 4.22–5.81)
RDW: 12.5 % (ref 11.5–15.5)
WBC Count: 5.9 10*3/uL (ref 4.0–10.5)
nRBC: 0 % (ref 0.0–0.2)

## 2021-11-17 LAB — CMP (CANCER CENTER ONLY)
ALT: 41 U/L (ref 0–44)
AST: 28 U/L (ref 15–41)
Albumin: 4.2 g/dL (ref 3.5–5.0)
Alkaline Phosphatase: 61 U/L (ref 38–126)
Anion gap: 10 (ref 5–15)
BUN: 19 mg/dL (ref 8–23)
CO2: 23 mmol/L (ref 22–32)
Calcium: 9.1 mg/dL (ref 8.9–10.3)
Chloride: 102 mmol/L (ref 98–111)
Creatinine: 1.4 mg/dL — ABNORMAL HIGH (ref 0.61–1.24)
GFR, Estimated: 53 mL/min — ABNORMAL LOW (ref >60)
Glucose, Bld: 193 mg/dL — ABNORMAL HIGH (ref 70–99)
Potassium: 3.8 mmol/L (ref 3.5–5.1)
Sodium: 135 mmol/L (ref 135–145)
Total Bilirubin: 1.2 mg/dL (ref 0.3–1.2)
Total Protein: 7 g/dL (ref 6.5–8.1)

## 2021-11-17 MED ORDER — DIPHENHYDRAMINE HCL 25 MG PO CAPS
50.0000 mg | ORAL_CAPSULE | Freq: Once | ORAL | Status: AC
  Administered 2021-11-17: 50 mg via ORAL
  Filled 2021-11-17: qty 2

## 2021-11-17 MED ORDER — ACETAMINOPHEN 325 MG PO TABS
650.0000 mg | ORAL_TABLET | Freq: Once | ORAL | Status: AC
  Administered 2021-11-17: 650 mg via ORAL
  Filled 2021-11-17: qty 2

## 2021-11-17 MED ORDER — SODIUM CHLORIDE 0.9 % IV SOLN: Freq: Once | INTRAVENOUS | Status: AC

## 2021-11-17 MED ORDER — FAMOTIDINE IN NACL 20-0.9 MG/50ML-% IV SOLN
20.0000 mg | Freq: Once | INTRAVENOUS | Status: AC
  Administered 2021-11-17: 20 mg via INTRAVENOUS
  Filled 2021-11-17: qty 50

## 2021-11-17 MED ORDER — SODIUM CHLORIDE 0.9 % IV SOLN
16.0000 mg/kg | Freq: Once | INTRAVENOUS | Status: AC
  Administered 2021-11-17: 1200 mg via INTRAVENOUS
  Filled 2021-11-17: qty 60

## 2021-11-17 MED ORDER — SODIUM CHLORIDE 0.9 % IV SOLN
20.0000 mg | Freq: Once | INTRAVENOUS | Status: AC
  Administered 2021-11-17: 20 mg via INTRAVENOUS
  Filled 2021-11-17: qty 20

## 2021-11-17 NOTE — Patient Instructions (Signed)
[facility]  Discharge Instructions: °Thank you for choosing [facility] to provide your oncology and hematology care.  ° °If you have a lab appointment with the Cancer Center, please go directly to the Cancer Center and check in at the registration area. °  °Wear comfortable clothing and clothing appropriate for easy access to any Portacath or PICC line.  ° °We strive to give you quality time with your provider. You may need to reschedule your appointment if you arrive late (15 or more minutes).  Arriving late affects you and other patients whose appointments are after yours.  Also, if you miss three or more appointments without notifying the office, you may be dismissed from the clinic at the provider’s discretion.    °  °For prescription refill requests, have your pharmacy contact our office and allow 72 hours for refills to be completed.   ° °Today you received the following chemotherapy and/or immunotherapy agents: Daratumumab.     °  °To help prevent nausea and vomiting after your treatment, we encourage you to take your nausea medication as directed. ° °BELOW ARE SYMPTOMS THAT SHOULD BE REPORTED IMMEDIATELY: °*FEVER GREATER THAN 100.4 F (38 °C) OR HIGHER °*CHILLS OR SWEATING °*NAUSEA AND VOMITING THAT IS NOT CONTROLLED WITH YOUR NAUSEA MEDICATION °*UNUSUAL SHORTNESS OF BREATH °*UNUSUAL BRUISING OR BLEEDING °*URINARY PROBLEMS (pain or burning when urinating, or frequent urination) °*BOWEL PROBLEMS (unusual diarrhea, constipation, pain near the anus) °TENDERNESS IN MOUTH AND THROAT WITH OR WITHOUT PRESENCE OF ULCERS (sore throat, sores in mouth, or a toothache) °UNUSUAL RASH, SWELLING OR PAIN  °UNUSUAL VAGINAL DISCHARGE OR ITCHING  ° °Items with * indicate a potential emergency and should be followed up as soon as possible or go to the Emergency Department if any problems should occur. ° °Please show the CHEMOTHERAPY ALERT CARD or IMMUNOTHERAPY ALERT CARD at check-in  to the Emergency Department and triage nurse. ° °Should you have questions after your visit or need to cancel or reschedule your appointment, please contact [facility]  Dept: [phone]  and follow the prompts.  Office hours are 8:00 a.m. to 4:30 p.m. Monday - Friday. Please note that voicemails left after 4:00 p.m. may not be returned until the following business day.  We are closed weekends and major holidays. You have access to a nurse at all times for urgent questions. Please call the main number to the clinic Dept: [phone] and follow the prompts. ° ° °For any non-urgent questions, you may also contact your provider using MyChart. We now offer e-Visits for anyone 18 and older to request care online for non-urgent symptoms. For details visit [email or web address]. °  °Also download the MyChart app! Go to the app store, search "MyChart", open the app, select [facility], and log in with your MyChart username and password. ° °Due to Covid, a mask is required upon entering the hospital/clinic. If you do not have a mask, one will be given to you upon arrival. For doctor visits, patients may have 1 support person aged 18 or older with them. For treatment visits, patients cannot have anyone with them due to current Covid guidelines and our immunocompromised population.  ° °

## 2021-11-22 ENCOUNTER — Other Ambulatory Visit: Payer: Self-pay

## 2021-11-23 ENCOUNTER — Encounter: Payer: Self-pay | Admitting: Hematology

## 2021-11-23 ENCOUNTER — Other Ambulatory Visit: Payer: Self-pay

## 2021-11-23 NOTE — Progress Notes (Signed)
? ? ?HEMATOLOGY/ONCOLOGY CLINIC NOTE ? ?Date of Service:.11/17/2021 ? ? ? ?Patient Care Team: ?[name], MD as PCP - General (Internal Medicine) ?[name], MD (Inactive) as Consulting Physician (Hematology and Oncology) ? ?CHIEF COMPLAINTS/PURPOSE OF CONSULTATION:  ?Follow-up for continued evaluation and management of relapsed multiple myeloma ? ?Oncologic History:  ?76 y.o. male with diagnosis of IgA lambda active multiple myeloma, ISS Stage I. Active disease diagnosed based on presence of anemia, kidney insufficiency, and paraproteinemia with significant predominance of lambda light chains as well as significant elevation of IgA. Bone marrow biopsy confirmed presence of atypical monoclonal plasma cell process in the bone marrow comprising at least 7% of the cellularity. Based on the findings, patient was started on treatment with lenalidomide, bortezomib, and low-dose dexamethasone based on the anticipated tolerance by the patient. Lenalidomide was started at 10 mg daily based on creatinine clearance of 42, dexamethasone dose was reduced to 20 mg weekly based on patient's age. Treatment was complicated by rapid development of cutaneous rash attributed to lenalidomide, inaddition to decreased renal function and now patient has persistent severe anemia. Side-effects were attributed to Lenalidomide and lenalidomide was discontinued with subsequent recovery.  Patient has been receiving bortezomib weekly with low-dose dexamethasone in 4-day cycles. ?  ?Patient has completed induction systemic therapy for his disease with repeat bone marrow biopsy confirming complete response including no evidence of minimal residual disease by cytogenetics or FISH. ? ?HISTORY OF PRESENTING ILLNESS:  ?Please see previous note for details of initial presentation. ? ?INTERVAL HISTORY:  ? ?Robert Sparks is a 76 y.o. male is here for continued evaluation and management of his relapsed multiple myeloma and next dose  of daratumumab. ?He notes no acute new symptoms.  Neuropathy is much better. ?Does not report any significant toxicities from the daratumumab treatment. ?Good p.o. intake.  No new focal pain or other symptoms. ?Labs done today were reviewed in detail with her. ? ? ?MEDICAL HISTORY:  ?Past Medical History:  ?Diagnosis Date  ? Anemia   ? Arthritis   ? shoulders,feet   ? Cataract   ? per eye dr -has appt 5-11  ? CKD (chronic kidney disease), stage III ([place])   ? Elevated PSA   ? History of ketoacidosis   ? 03-29-2015   ? History of sepsis   ? 07-25-2013  non-compliant w/ medication  ? Hyperlipidemia   ? Hypertension   ? Nocturia   ? Peripheral neuropathy   ? Prostate cancer ([place])   ? Type 2 diabetes mellitus with insulin therapy ([place])   ? ? ?SURGICAL HISTORY: ?Past Surgical History:  ?Procedure Laterality Date  ? CIRCUMCISION  1990's  ? PROSTATE BIOPSY N/A 12/21/2015  ? Procedure: BIOPSY TRANSRECTAL ULTRASONIC PROSTATE (TUBP);  Surgeon: Festus Aloe, MD;  Location: [facility];  Service: Urology;  Laterality: N/A;  ? ? ?SOCIAL HISTORY: ?Social History  ? ?Socioeconomic History  ? Marital status: Single  ?  Spouse name: Not on file  ? Number of children: Not on file  ? Years of education: Not on file  ? Highest education level: Not on file  ?Occupational History  ? Not on file  ?Tobacco Use  ? Smoking status: Every Day  ?  Packs/day: 0.50  ?  Years: 50.00  ?  Pack years: 25.00  ?  Types: Cigarettes  ? Smokeless tobacco: Never  ? Tobacco comments:  ?  1 ppwk-4-5 a day   ?Vaping Use  ? Vaping Use: Never used  ?  Substance and Sexual Activity  ? Alcohol use: Yes  ?  Alcohol/week: 1.0 standard drink  ?  Types: 1 Cans of beer per week  ?  Comment: 1 every day-quit couple weeks ago  ? Drug use: No  ? Sexual activity: Not Currently  ?Other Topics Concern  ? Not on file  ?Social History Narrative  ? Not on file  ? ?Social Determinants of Health  ? ?Financial Resource Strain: Not on file  ?Food Insecurity: Not on  file  ?Transportation Needs: Not on file  ?Physical Activity: Not on file  ?Stress: Not on file  ?Social Connections: Not on file  ?Intimate Partner Violence: Not on file  ? ? ?FAMILY HISTORY: ?Family History  ?Problem Relation Age of Onset  ? Kidney disease Mother   ? Cancer Neg Hx   ? Colon cancer Neg Hx   ? Colon polyps Neg Hx   ? Esophageal cancer Neg Hx   ? Rectal cancer Neg Hx   ? Stomach cancer Neg Hx   ? ? ?ALLERGIES:  is allergic to other. ? ?MEDICATIONS:  ?Current Outpatient Medications  ?Medication Sig Dispense Refill  ? Accu-Chek Softclix Lancets lancets Use to test blood sugar 3 times daily. 100 each 3  ? acyclovir (ZOVIRAX) 400 MG tablet Take 1 tablet (400 mg total) by mouth 2 (two) times daily. 60 tablet 11  ? amLODipine (NORVASC) 10 MG tablet TAKE 1 TABLET (10 MG TOTAL) BY MOUTH DAILY. 90 tablet 3  ? b complex vitamins capsule Take 1 capsule by mouth daily. 30 capsule 5  ? Blood Glucose Monitoring Suppl (ACCU-CHEK GUIDE) w/Device KIT Use to test blood sugar three times daily 1 kit 0  ? Continuous Blood Gluc Receiver (FREESTYLE LIBRE READER) DEVI Use to check blood sugar 3x daily. E11.42 1 each 0  ? Continuous Blood Gluc Sensor (FREESTYLE LIBRE SENSOR SYSTEM) MISC Use to check blood sugar 3x daily. Change sensor Q 2 wks. E11.42 2 each 12  ? dexamethasone (DECADRON) 4 MG tablet 86m (5 tabs) Take the day after each dose of daratumumab. Take with breakfast 20 tablet 11  ? diclofenac Sodium (VOLTAREN) 1 % GEL Apply 4 g topically 4 (four) times daily. 150 g 5  ? donepezil (ARICEPT) 5 MG tablet TAKE 1 TABLET (5 MG TOTAL) BY MOUTH AT BEDTIME. FOR MEMORY ISSUES 30 tablet 5  ? ferrous sulfate 325 (65 FE) MG tablet Take 325 mg by mouth daily.    ? gabapentin (NEURONTIN) 300 MG capsule TAKE 1 CAPSULE BY MOUTH IN THE MORNING AND LUNCH, AND 2 AT BEDTIME 120 capsule 6  ? glucose blood (ACCU-CHEK GUIDE) test strip Use to test blood sugar 3 times daily. 100 each 2  ? hydrOXYzine (ATARAX/VISTARIL) 25 MG tablet TAKE 1  TABLET (25 MG TOTAL) BY MOUTH 3 (THREE) TIMES DAILY AS NEEDED FOR ITCHING (RASH). 30 tablet 0  ? insulin aspart protamine - aspart (NOVOLOG MIX 70/30 FLEXPEN) (70-30) 100 UNIT/ML FlexPen Inject 6 units twice a day with meals.  For 3 days after taking Dexamethazone, increase to 9 units twice a day into the skin. 15 mL 11  ? Insulin Pen Needle (BD PEN NEEDLE NANO U/F) 32G X 4 MM MISC USE AS DIRECTED TWICE DAILY WITH INSULIN 100 each 6  ? lidocaine (LIDODERM) 5 %     ? lisinopril (ZESTRIL) 2.5 MG tablet Take 1 tablet (2.5 mg total) by mouth daily. 30 tablet 4  ? Miconazole Nitrate (ATHLETES FOOT POWDER SPRAY) 2 % AERP Spray  between toes and under toes once daily. 130 g 4  ? Multiple Vitamin (MULTIVITAMIN WITH MINERALS) TABS tablet Take 1 tablet by mouth daily. 60 tablet 2  ? ondansetron (ZOFRAN) 8 MG tablet Take 1 tablet (8 mg total) by mouth 2 (two) times daily as needed (Nausea or vomiting). 30 tablet 1  ? pravastatin (PRAVACHOL) 20 MG tablet TAKE 1 TABLET (20 MG TOTAL) BY MOUTH DAILY. 90 tablet 2  ? prochlorperazine (COMPAZINE) 10 MG tablet Take 1 tablet (10 mg total) by mouth every 6 (six) hours as needed (Nausea or vomiting). 30 tablet 1  ? tadalafil (CIALIS) 20 MG tablet Take 20 mg by mouth as needed.    ? tiZANidine (ZANAFLEX) 2 MG tablet Take 1 tablet (2 mg total) by mouth at bedtime as needed for muscle spasms. 30 tablet 1  ? vitamin B-12 (CYANOCOBALAMIN) 100 MCG tablet Take 100 mcg by mouth daily.    ? ?No current facility-administered medications for this visit.  ? ? ?ROS ?10 Point review of Systems was done is negative except as noted above. ? ? ?PHYSICAL EXAMINATION: ?.BP 116/71 (BP Location: Left Arm, Patient Position: Sitting)   Pulse 86   Temp 97.7 ?F (36.5 ?C) (Temporal)   Resp 18   Ht '5\' 7"'  (1.702 m)   Wt 163 lb 11.2 oz (74.3 kg)   SpO2 100%   BMI 25.64 kg/m?  ?NAD ?GENERAL:alert, in no acute distress and comfortable ?SKIN: no acute rashes, no significant lesions ?EYES: conjunctiva are pink and  non-injected, sclera anicteric ?OROPHARYNX: MMM, no exudates, no oropharyngeal erythema or ulceration ?NECK: supple, no JVD ?LYMPH:  no palpable lymphadenopathy in the cervical, axillary or inguinal regio

## 2021-11-30 ENCOUNTER — Other Ambulatory Visit: Payer: Self-pay

## 2021-11-30 DIAGNOSIS — C9002 Multiple myeloma in relapse: Secondary | ICD-10-CM

## 2021-11-30 MED FILL — Dexamethasone Sodium Phosphate Inj 100 MG/10ML: INTRAMUSCULAR | Qty: 2 | Status: AC

## 2021-12-01 ENCOUNTER — Inpatient Hospital Stay: Payer: Medicare HMO

## 2021-12-01 VITALS — BP 134/72 | HR 81 | Temp 98.0°F | Resp 17 | Wt 164.8 lb

## 2021-12-01 DIAGNOSIS — Z5112 Encounter for antineoplastic immunotherapy: Secondary | ICD-10-CM | POA: Diagnosis not present

## 2021-12-01 DIAGNOSIS — E1122 Type 2 diabetes mellitus with diabetic chronic kidney disease: Secondary | ICD-10-CM | POA: Diagnosis not present

## 2021-12-01 DIAGNOSIS — Z7189 Other specified counseling: Secondary | ICD-10-CM

## 2021-12-01 DIAGNOSIS — C9002 Multiple myeloma in relapse: Secondary | ICD-10-CM

## 2021-12-01 DIAGNOSIS — G620 Drug-induced polyneuropathy: Secondary | ICD-10-CM | POA: Diagnosis not present

## 2021-12-01 DIAGNOSIS — I129 Hypertensive chronic kidney disease with stage 1 through stage 4 chronic kidney disease, or unspecified chronic kidney disease: Secondary | ICD-10-CM | POA: Diagnosis not present

## 2021-12-01 DIAGNOSIS — N189 Chronic kidney disease, unspecified: Secondary | ICD-10-CM | POA: Diagnosis not present

## 2021-12-01 LAB — CBC WITH DIFFERENTIAL (CANCER CENTER ONLY)
Abs Immature Granulocytes: 0.01 10*3/uL (ref 0.00–0.07)
Basophils Absolute: 0 10*3/uL (ref 0.0–0.1)
Basophils Relative: 1 %
Eosinophils Absolute: 0.4 10*3/uL (ref 0.0–0.5)
Eosinophils Relative: 6 %
HCT: 37.7 % — ABNORMAL LOW (ref 39.0–52.0)
Hemoglobin: 13.3 g/dL (ref 13.0–17.0)
Immature Granulocytes: 0 %
Lymphocytes Relative: 22 %
Lymphs Abs: 1.2 10*3/uL (ref 0.7–4.0)
MCH: 35.6 pg — ABNORMAL HIGH (ref 26.0–34.0)
MCHC: 35.3 g/dL (ref 30.0–36.0)
MCV: 100.8 fL — ABNORMAL HIGH (ref 80.0–100.0)
Monocytes Absolute: 0.3 10*3/uL (ref 0.1–1.0)
Monocytes Relative: 6 %
Neutro Abs: 3.6 10*3/uL (ref 1.7–7.7)
Neutrophils Relative %: 65 %
Platelet Count: 182 10*3/uL (ref 150–400)
RBC: 3.74 MIL/uL — ABNORMAL LOW (ref 4.22–5.81)
RDW: 12.8 % (ref 11.5–15.5)
WBC Count: 5.5 10*3/uL (ref 4.0–10.5)
nRBC: 0 % (ref 0.0–0.2)

## 2021-12-01 LAB — CMP (CANCER CENTER ONLY)
ALT: 44 U/L (ref 0–44)
AST: 28 U/L (ref 15–41)
Albumin: 4.2 g/dL (ref 3.5–5.0)
Alkaline Phosphatase: 60 U/L (ref 38–126)
Anion gap: 9 (ref 5–15)
BUN: 17 mg/dL (ref 8–23)
CO2: 24 mmol/L (ref 22–32)
Calcium: 9.3 mg/dL (ref 8.9–10.3)
Chloride: 105 mmol/L (ref 98–111)
Creatinine: 1.38 mg/dL — ABNORMAL HIGH (ref 0.61–1.24)
GFR, Estimated: 54 mL/min — ABNORMAL LOW (ref >60)
Glucose, Bld: 118 mg/dL — ABNORMAL HIGH (ref 70–99)
Potassium: 3.5 mmol/L (ref 3.5–5.1)
Sodium: 138 mmol/L (ref 135–145)
Total Bilirubin: 0.9 mg/dL (ref 0.3–1.2)
Total Protein: 6.6 g/dL (ref 6.5–8.1)

## 2021-12-01 MED ORDER — SODIUM CHLORIDE 0.9 % IV SOLN
16.0000 mg/kg | Freq: Once | INTRAVENOUS | Status: AC
  Administered 2021-12-01: 1200 mg via INTRAVENOUS
  Filled 2021-12-01: qty 60

## 2021-12-01 MED ORDER — ACETAMINOPHEN 325 MG PO TABS
650.0000 mg | ORAL_TABLET | Freq: Once | ORAL | Status: AC
  Administered 2021-12-01: 650 mg via ORAL
  Filled 2021-12-01: qty 2

## 2021-12-01 MED ORDER — FAMOTIDINE IN NACL 20-0.9 MG/50ML-% IV SOLN
20.0000 mg | Freq: Once | INTRAVENOUS | Status: AC
  Administered 2021-12-01: 20 mg via INTRAVENOUS
  Filled 2021-12-01: qty 50

## 2021-12-01 MED ORDER — SODIUM CHLORIDE 0.9 % IV SOLN: Freq: Once | INTRAVENOUS | Status: AC

## 2021-12-01 MED ORDER — DIPHENHYDRAMINE HCL 25 MG PO CAPS
50.0000 mg | ORAL_CAPSULE | Freq: Once | ORAL | Status: AC
  Administered 2021-12-01: 50 mg via ORAL
  Filled 2021-12-01: qty 2

## 2021-12-01 MED ORDER — SODIUM CHLORIDE 0.9 % IV SOLN
20.0000 mg | Freq: Once | INTRAVENOUS | Status: AC
  Administered 2021-12-01: 20 mg via INTRAVENOUS
  Filled 2021-12-01: qty 20

## 2021-12-01 NOTE — Patient Instructions (Signed)
[facility]  Discharge Instructions: °Thank you for choosing [facility] to provide your oncology and hematology care.  ° °If you have a lab appointment with the Cancer Center, please go directly to the Cancer Center and check in at the registration area. °  °Wear comfortable clothing and clothing appropriate for easy access to any Portacath or PICC line.  ° °We strive to give you quality time with your provider. You may need to reschedule your appointment if you arrive late (15 or more minutes).  Arriving late affects you and other patients whose appointments are after yours.  Also, if you miss three or more appointments without notifying the office, you may be dismissed from the clinic at the provider’s discretion.    °  °For prescription refill requests, have your pharmacy contact our office and allow 72 hours for refills to be completed.   ° °Today you received the following chemotherapy and/or immunotherapy agents: Darzalex  °  °To help prevent nausea and vomiting after your treatment, we encourage you to take your nausea medication as directed. ° °BELOW ARE SYMPTOMS THAT SHOULD BE REPORTED IMMEDIATELY: °*FEVER GREATER THAN 100.4 F (38 °C) OR HIGHER °*CHILLS OR SWEATING °*NAUSEA AND VOMITING THAT IS NOT CONTROLLED WITH YOUR NAUSEA MEDICATION °*UNUSUAL SHORTNESS OF BREATH °*UNUSUAL BRUISING OR BLEEDING °*URINARY PROBLEMS (pain or burning when urinating, or frequent urination) °*BOWEL PROBLEMS (unusual diarrhea, constipation, pain near the anus) °TENDERNESS IN MOUTH AND THROAT WITH OR WITHOUT PRESENCE OF ULCERS (sore throat, sores in mouth, or a toothache) °UNUSUAL RASH, SWELLING OR PAIN  °UNUSUAL VAGINAL DISCHARGE OR ITCHING  ° °Items with * indicate a potential emergency and should be followed up as soon as possible or go to the Emergency Department if any problems should occur. ° °Please show the CHEMOTHERAPY ALERT CARD or IMMUNOTHERAPY ALERT CARD at check-in to the  Emergency Department and triage nurse. ° °Should you have questions after your visit or need to cancel or reschedule your appointment, please contact [facility]  Dept: [phone]  and follow the prompts.  Office hours are 8:00 a.m. to 4:30 p.m. Monday - Friday. Please note that voicemails left after 4:00 p.m. may not be returned until the following business day.  We are closed weekends and major holidays. You have access to a nurse at all times for urgent questions. Please call the main number to the clinic Dept: [phone] and follow the prompts. ° ° °For any non-urgent questions, you may also contact your provider using MyChart. We now offer e-Visits for anyone 18 and older to request care online for non-urgent symptoms. For details visit [email or web address]. °  °Also download the MyChart app! Go to the app store, search "MyChart", open the app, select [facility], and log in with your MyChart username and password. ° °Due to Covid, a mask is required upon entering the hospital/clinic. If you do not have a mask, one will be given to you upon arrival. For doctor visits, patients may have 1 support person aged 18 or older with them. For treatment visits, patients cannot have anyone with them due to current Covid guidelines and our immunocompromised population.  ° °

## 2021-12-02 ENCOUNTER — Other Ambulatory Visit: Payer: Self-pay

## 2021-12-02 LAB — KAPPA/LAMBDA LIGHT CHAINS
Kappa free light chain: 33.2 mg/L — ABNORMAL HIGH (ref 3.3–19.4)
Kappa, lambda light chain ratio: 0.82 (ref 0.26–1.65)
Lambda free light chains: 40.6 mg/L — ABNORMAL HIGH (ref 5.7–26.3)

## 2021-12-06 ENCOUNTER — Other Ambulatory Visit: Payer: Self-pay

## 2021-12-06 LAB — MULTIPLE MYELOMA PANEL, SERUM
Albumin SerPl Elph-Mcnc: 4.1 g/dL (ref 2.9–4.4)
Albumin/Glob SerPl: 1.8 — ABNORMAL HIGH (ref 0.7–1.7)
Alpha 1: 0.2 g/dL (ref 0.0–0.4)
Alpha2 Glob SerPl Elph-Mcnc: 0.8 g/dL (ref 0.4–1.0)
B-Globulin SerPl Elph-Mcnc: 0.9 g/dL (ref 0.7–1.3)
Gamma Glob SerPl Elph-Mcnc: 0.4 g/dL (ref 0.4–1.8)
Globulin, Total: 2.3 g/dL (ref 2.2–3.9)
IgA: 153 mg/dL (ref 61–437)
IgG (Immunoglobin G), Serum: 509 mg/dL — ABNORMAL LOW (ref 603–1613)
IgM (Immunoglobulin M), Srm: 36 mg/dL (ref 15–143)
M Protein SerPl Elph-Mcnc: 0.1 g/dL — ABNORMAL HIGH
Total Protein ELP: 6.4 g/dL (ref 6.0–8.5)

## 2021-12-07 ENCOUNTER — Other Ambulatory Visit: Payer: Self-pay

## 2021-12-12 ENCOUNTER — Other Ambulatory Visit: Payer: Self-pay

## 2021-12-13 ENCOUNTER — Other Ambulatory Visit: Payer: Self-pay

## 2021-12-13 DIAGNOSIS — C9002 Multiple myeloma in relapse: Secondary | ICD-10-CM

## 2021-12-14 ENCOUNTER — Other Ambulatory Visit: Payer: Self-pay

## 2021-12-14 MED FILL — Dexamethasone Sodium Phosphate Inj 100 MG/10ML: INTRAMUSCULAR | Qty: 2 | Status: AC

## 2021-12-15 ENCOUNTER — Inpatient Hospital Stay: Payer: Medicare HMO | Attending: Hematology

## 2021-12-15 ENCOUNTER — Inpatient Hospital Stay: Payer: Medicare HMO

## 2021-12-15 ENCOUNTER — Other Ambulatory Visit: Payer: Self-pay

## 2021-12-15 VITALS — BP 127/79 | HR 83 | Temp 98.2°F | Resp 16 | Wt 164.5 lb

## 2021-12-15 DIAGNOSIS — C9002 Multiple myeloma in relapse: Secondary | ICD-10-CM

## 2021-12-15 DIAGNOSIS — Z5112 Encounter for antineoplastic immunotherapy: Secondary | ICD-10-CM | POA: Diagnosis not present

## 2021-12-15 DIAGNOSIS — E1140 Type 2 diabetes mellitus with diabetic neuropathy, unspecified: Secondary | ICD-10-CM | POA: Insufficient documentation

## 2021-12-15 DIAGNOSIS — E1122 Type 2 diabetes mellitus with diabetic chronic kidney disease: Secondary | ICD-10-CM | POA: Diagnosis not present

## 2021-12-15 DIAGNOSIS — I129 Hypertensive chronic kidney disease with stage 1 through stage 4 chronic kidney disease, or unspecified chronic kidney disease: Secondary | ICD-10-CM | POA: Insufficient documentation

## 2021-12-15 DIAGNOSIS — N189 Chronic kidney disease, unspecified: Secondary | ICD-10-CM | POA: Diagnosis not present

## 2021-12-15 DIAGNOSIS — Z7189 Other specified counseling: Secondary | ICD-10-CM

## 2021-12-15 LAB — CBC WITH DIFFERENTIAL (CANCER CENTER ONLY)
Abs Immature Granulocytes: 0.02 10*3/uL (ref 0.00–0.07)
Basophils Absolute: 0 10*3/uL (ref 0.0–0.1)
Basophils Relative: 1 %
Eosinophils Absolute: 0.4 10*3/uL (ref 0.0–0.5)
Eosinophils Relative: 8 %
HCT: 36 % — ABNORMAL LOW (ref 39.0–52.0)
Hemoglobin: 12.7 g/dL — ABNORMAL LOW (ref 13.0–17.0)
Immature Granulocytes: 0 %
Lymphocytes Relative: 23 %
Lymphs Abs: 1.2 10*3/uL (ref 0.7–4.0)
MCH: 35.4 pg — ABNORMAL HIGH (ref 26.0–34.0)
MCHC: 35.3 g/dL (ref 30.0–36.0)
MCV: 100.3 fL — ABNORMAL HIGH (ref 80.0–100.0)
Monocytes Absolute: 0.4 10*3/uL (ref 0.1–1.0)
Monocytes Relative: 8 %
Neutro Abs: 3.4 10*3/uL (ref 1.7–7.7)
Neutrophils Relative %: 60 %
Platelet Count: 203 10*3/uL (ref 150–400)
RBC: 3.59 MIL/uL — ABNORMAL LOW (ref 4.22–5.81)
RDW: 12.6 % (ref 11.5–15.5)
WBC Count: 5.5 10*3/uL (ref 4.0–10.5)
nRBC: 0 % (ref 0.0–0.2)

## 2021-12-15 LAB — CMP (CANCER CENTER ONLY)
ALT: 44 U/L (ref 0–44)
AST: 27 U/L (ref 15–41)
Albumin: 4.1 g/dL (ref 3.5–5.0)
Alkaline Phosphatase: 69 U/L (ref 38–126)
Anion gap: 8 (ref 5–15)
BUN: 17 mg/dL (ref 8–23)
CO2: 22 mmol/L (ref 22–32)
Calcium: 9.8 mg/dL (ref 8.9–10.3)
Chloride: 107 mmol/L (ref 98–111)
Creatinine: 1.28 mg/dL — ABNORMAL HIGH (ref 0.61–1.24)
GFR, Estimated: 59 mL/min — ABNORMAL LOW (ref >60)
Glucose, Bld: 131 mg/dL — ABNORMAL HIGH (ref 70–99)
Potassium: 3.6 mmol/L (ref 3.5–5.1)
Sodium: 137 mmol/L (ref 135–145)
Total Bilirubin: 0.8 mg/dL (ref 0.3–1.2)
Total Protein: 6.5 g/dL (ref 6.5–8.1)

## 2021-12-15 MED ORDER — DIPHENHYDRAMINE HCL 25 MG PO CAPS
50.0000 mg | ORAL_CAPSULE | Freq: Once | ORAL | Status: AC
  Administered 2021-12-15: 50 mg via ORAL
  Filled 2021-12-15: qty 2

## 2021-12-15 MED ORDER — SODIUM CHLORIDE 0.9 % IV SOLN: Freq: Once | INTRAVENOUS | Status: AC

## 2021-12-15 MED ORDER — SODIUM CHLORIDE 0.9 % IV SOLN
20.0000 mg | Freq: Once | INTRAVENOUS | Status: AC
  Administered 2021-12-15: 20 mg via INTRAVENOUS
  Filled 2021-12-15: qty 20

## 2021-12-15 MED ORDER — FAMOTIDINE IN NACL 20-0.9 MG/50ML-% IV SOLN
20.0000 mg | Freq: Once | INTRAVENOUS | Status: AC
  Administered 2021-12-15: 20 mg via INTRAVENOUS
  Filled 2021-12-15: qty 50

## 2021-12-15 MED ORDER — ACETAMINOPHEN 325 MG PO TABS
650.0000 mg | ORAL_TABLET | Freq: Once | ORAL | Status: AC
  Administered 2021-12-15: 650 mg via ORAL
  Filled 2021-12-15: qty 2

## 2021-12-15 MED ORDER — SODIUM CHLORIDE 0.9 % IV SOLN
16.0000 mg/kg | Freq: Once | INTRAVENOUS | Status: AC
  Administered 2021-12-15: 1200 mg via INTRAVENOUS
  Filled 2021-12-15: qty 60

## 2021-12-15 NOTE — Patient Instructions (Signed)
[facility] MEDICAL ONCOLOGY  Discharge Instructions: °Thank you for choosing [facility] to provide your oncology and hematology care.  ° °If you have a lab appointment with the Cancer Center, please go directly to the Cancer Center and check in at the registration area. °  °Wear comfortable clothing and clothing appropriate for easy access to any Portacath or PICC line.  ° °We strive to give you quality time with your provider. You may need to reschedule your appointment if you arrive late (15 or more minutes).  Arriving late affects you and other patients whose appointments are after yours.  Also, if you miss three or more appointments without notifying the office, you may be dismissed from the clinic at the provider’s discretion.    °  °For prescription refill requests, have your pharmacy contact our office and allow 72 hours for refills to be completed.   ° °Today you received the following chemotherapy and/or immunotherapy agents: Darzalex  °  °To help prevent nausea and vomiting after your treatment, we encourage you to take your nausea medication as directed. ° °BELOW ARE SYMPTOMS THAT SHOULD BE REPORTED IMMEDIATELY: °*FEVER GREATER THAN 100.4 F (38 °C) OR HIGHER °*CHILLS OR SWEATING °*NAUSEA AND VOMITING THAT IS NOT CONTROLLED WITH YOUR NAUSEA MEDICATION °*UNUSUAL SHORTNESS OF BREATH °*UNUSUAL BRUISING OR BLEEDING °*URINARY PROBLEMS (pain or burning when urinating, or frequent urination) °*BOWEL PROBLEMS (unusual diarrhea, constipation, pain near the anus) °TENDERNESS IN MOUTH AND THROAT WITH OR WITHOUT PRESENCE OF ULCERS (sore throat, sores in mouth, or a toothache) °UNUSUAL RASH, SWELLING OR PAIN  °UNUSUAL VAGINAL DISCHARGE OR ITCHING  ° °Items with * indicate a potential emergency and should be followed up as soon as possible or go to the Emergency Department if any problems should occur. ° °Please show the CHEMOTHERAPY ALERT CARD or IMMUNOTHERAPY ALERT CARD at check-in to the  Emergency Department and triage nurse. ° °Should you have questions after your visit or need to cancel or reschedule your appointment, please contact [facility]  Dept: [phone]  and follow the prompts.  Office hours are 8:00 a.m. to 4:30 p.m. Monday - Friday. Please note that voicemails left after 4:00 p.m. may not be returned until the following business day.  We are closed weekends and major holidays. You have access to a nurse at all times for urgent questions. Please call the main number to the clinic Dept: [phone] and follow the prompts. ° ° °For any non-urgent questions, you may also contact your provider using MyChart. We now offer e-Visits for anyone 18 and older to request care online for non-urgent symptoms. For details visit [email or web address]. °  °Also download the MyChart app! Go to the app store, search "MyChart", open the app, select [facility], and log in with your MyChart username and password. ° °Due to Covid, a mask is required upon entering the hospital/clinic. If you do not have a mask, one will be given to you upon arrival. For doctor visits, patients may have 1 support person aged 18 or older with them. For treatment visits, patients cannot have anyone with them due to current Covid guidelines and our immunocompromised population.  ° °

## 2021-12-16 ENCOUNTER — Other Ambulatory Visit: Payer: Self-pay

## 2021-12-28 DIAGNOSIS — E1122 Type 2 diabetes mellitus with diabetic chronic kidney disease: Secondary | ICD-10-CM | POA: Diagnosis not present

## 2021-12-28 DIAGNOSIS — N182 Chronic kidney disease, stage 2 (mild): Secondary | ICD-10-CM | POA: Diagnosis not present

## 2021-12-28 DIAGNOSIS — I10 Essential (primary) hypertension: Secondary | ICD-10-CM | POA: Diagnosis not present

## 2021-12-28 DIAGNOSIS — D631 Anemia in chronic kidney disease: Secondary | ICD-10-CM | POA: Diagnosis not present

## 2021-12-28 DIAGNOSIS — C9000 Multiple myeloma not having achieved remission: Secondary | ICD-10-CM | POA: Diagnosis not present

## 2021-12-28 DIAGNOSIS — N2581 Secondary hyperparathyroidism of renal origin: Secondary | ICD-10-CM | POA: Diagnosis not present

## 2021-12-29 ENCOUNTER — Inpatient Hospital Stay: Payer: Medicare HMO

## 2021-12-29 ENCOUNTER — Other Ambulatory Visit: Payer: Self-pay

## 2021-12-29 DIAGNOSIS — C9002 Multiple myeloma in relapse: Secondary | ICD-10-CM

## 2021-12-30 ENCOUNTER — Inpatient Hospital Stay: Payer: Medicare HMO

## 2021-12-30 ENCOUNTER — Other Ambulatory Visit: Payer: Self-pay

## 2021-12-30 ENCOUNTER — Inpatient Hospital Stay (HOSPITAL_BASED_OUTPATIENT_CLINIC_OR_DEPARTMENT_OTHER): Payer: Medicare HMO | Admitting: Hematology

## 2021-12-30 VITALS — BP 135/69 | HR 81 | Temp 97.6°F | Resp 18

## 2021-12-30 VITALS — BP 131/68 | HR 81 | Temp 97.5°F | Resp 18 | Wt 161.8 lb

## 2021-12-30 DIAGNOSIS — Z5111 Encounter for antineoplastic chemotherapy: Secondary | ICD-10-CM

## 2021-12-30 DIAGNOSIS — N189 Chronic kidney disease, unspecified: Secondary | ICD-10-CM | POA: Diagnosis not present

## 2021-12-30 DIAGNOSIS — C9002 Multiple myeloma in relapse: Secondary | ICD-10-CM | POA: Diagnosis not present

## 2021-12-30 DIAGNOSIS — Z5112 Encounter for antineoplastic immunotherapy: Secondary | ICD-10-CM | POA: Diagnosis not present

## 2021-12-30 DIAGNOSIS — I129 Hypertensive chronic kidney disease with stage 1 through stage 4 chronic kidney disease, or unspecified chronic kidney disease: Secondary | ICD-10-CM | POA: Diagnosis not present

## 2021-12-30 DIAGNOSIS — Z7189 Other specified counseling: Secondary | ICD-10-CM

## 2021-12-30 DIAGNOSIS — E1140 Type 2 diabetes mellitus with diabetic neuropathy, unspecified: Secondary | ICD-10-CM | POA: Diagnosis not present

## 2021-12-30 DIAGNOSIS — E1122 Type 2 diabetes mellitus with diabetic chronic kidney disease: Secondary | ICD-10-CM | POA: Diagnosis not present

## 2021-12-30 LAB — CMP (CANCER CENTER ONLY)
ALT: 24 U/L (ref 0–44)
AST: 20 U/L (ref 15–41)
Albumin: 4.3 g/dL (ref 3.5–5.0)
Alkaline Phosphatase: 58 U/L (ref 38–126)
Anion gap: 7 (ref 5–15)
BUN: 13 mg/dL (ref 8–23)
CO2: 24 mmol/L (ref 22–32)
Calcium: 9.6 mg/dL (ref 8.9–10.3)
Chloride: 107 mmol/L (ref 98–111)
Creatinine: 1.3 mg/dL — ABNORMAL HIGH (ref 0.61–1.24)
GFR, Estimated: 58 mL/min — ABNORMAL LOW (ref >60)
Glucose, Bld: 190 mg/dL — ABNORMAL HIGH (ref 70–99)
Potassium: 3.8 mmol/L (ref 3.5–5.1)
Sodium: 138 mmol/L (ref 135–145)
Total Bilirubin: 0.8 mg/dL (ref 0.3–1.2)
Total Protein: 6.7 g/dL (ref 6.5–8.1)

## 2021-12-30 LAB — CBC WITH DIFFERENTIAL (CANCER CENTER ONLY)
Abs Immature Granulocytes: 0.02 10*3/uL (ref 0.00–0.07)
Basophils Absolute: 0 10*3/uL (ref 0.0–0.1)
Basophils Relative: 1 %
Eosinophils Absolute: 0.5 10*3/uL (ref 0.0–0.5)
Eosinophils Relative: 9 %
HCT: 35.7 % — ABNORMAL LOW (ref 39.0–52.0)
Hemoglobin: 12.8 g/dL — ABNORMAL LOW (ref 13.0–17.0)
Immature Granulocytes: 0 %
Lymphocytes Relative: 20 %
Lymphs Abs: 1.1 10*3/uL (ref 0.7–4.0)
MCH: 36.6 pg — ABNORMAL HIGH (ref 26.0–34.0)
MCHC: 35.9 g/dL (ref 30.0–36.0)
MCV: 102 fL — ABNORMAL HIGH (ref 80.0–100.0)
Monocytes Absolute: 0.4 10*3/uL (ref 0.1–1.0)
Monocytes Relative: 7 %
Neutro Abs: 3.5 10*3/uL (ref 1.7–7.7)
Neutrophils Relative %: 63 %
Platelet Count: 185 10*3/uL (ref 150–400)
RBC: 3.5 MIL/uL — ABNORMAL LOW (ref 4.22–5.81)
RDW: 13.2 % (ref 11.5–15.5)
WBC Count: 5.6 10*3/uL (ref 4.0–10.5)
nRBC: 0 % (ref 0.0–0.2)

## 2021-12-30 MED ORDER — DIPHENHYDRAMINE HCL 25 MG PO CAPS
50.0000 mg | ORAL_CAPSULE | Freq: Once | ORAL | Status: AC
  Administered 2021-12-30: 50 mg via ORAL
  Filled 2021-12-30: qty 2

## 2021-12-30 MED ORDER — ACETAMINOPHEN 325 MG PO TABS
650.0000 mg | ORAL_TABLET | Freq: Once | ORAL | Status: AC
  Administered 2021-12-30: 650 mg via ORAL
  Filled 2021-12-30: qty 2

## 2021-12-30 MED ORDER — SODIUM CHLORIDE 0.9 % IV SOLN
16.0000 mg/kg | Freq: Once | INTRAVENOUS | Status: AC
  Administered 2021-12-30: 1200 mg via INTRAVENOUS
  Filled 2021-12-30: qty 60

## 2021-12-30 MED ORDER — FAMOTIDINE IN NACL 20-0.9 MG/50ML-% IV SOLN
20.0000 mg | Freq: Once | INTRAVENOUS | Status: AC
  Administered 2021-12-30: 20 mg via INTRAVENOUS
  Filled 2021-12-30: qty 50

## 2021-12-30 MED ORDER — SODIUM CHLORIDE 0.9 % IV SOLN: Freq: Once | INTRAVENOUS | Status: AC

## 2021-12-30 MED ORDER — SODIUM CHLORIDE 0.9 % IV SOLN
20.0000 mg | Freq: Once | INTRAVENOUS | Status: AC
  Administered 2021-12-30: 20 mg via INTRAVENOUS
  Filled 2021-12-30: qty 20

## 2021-12-30 NOTE — Progress Notes (Addendum)
HEMATOLOGY/ONCOLOGY CLINIC NOTE  Date of Service: 12/30/2021  Patient Care Team: [name], MD as PCP - General (Internal Medicine) [name], MD (Inactive) as Consulting Physician (Hematology and Oncology)  CHIEF COMPLAINTS/PURPOSE OF CONSULTATION:  Follow-up for continued evaluation and management of relapsed multiple myeloma  Oncologic History:  76 y.o. male with diagnosis of IgA lambda active multiple myeloma, ISS Stage I. Active disease diagnosed based on presence of anemia, kidney insufficiency, and paraproteinemia with significant predominance of lambda light chains as well as significant elevation of IgA. Bone marrow biopsy confirmed presence of atypical monoclonal plasma cell process in the bone marrow comprising at least 7% of the cellularity. Based on the findings, patient was started on treatment with lenalidomide, bortezomib, and low-dose dexamethasone based on the anticipated tolerance by the patient. Lenalidomide was started at 10 mg daily based on creatinine clearance of 42, dexamethasone dose was reduced to 20 mg weekly based on patient's age. Treatment was complicated by rapid development of cutaneous rash attributed to lenalidomide, inaddition to decreased renal function and now patient has persistent severe anemia. Side-effects were attributed to Lenalidomide and lenalidomide was discontinued with subsequent recovery.  Patient has been receiving bortezomib weekly with low-dose dexamethasone in 4-day cycles.   Patient has completed induction systemic therapy for his disease with repeat bone marrow biopsy confirming complete response including no evidence of minimal residual disease by cytogenetics or FISH.  HISTORY OF PRESENTING ILLNESS:  Please see previous note for details of initial presentation.  INTERVAL HISTORY:   [name] is a 76 y.o. male is here for continued evaluation and management of his relapsed multiple myeloma and next dose of  daratumumab. He reports He is doing well with no new symptoms or concerns.  He reports decreased range of motion in his right shoulder but also notes that both of his shoulders ache. He endorses Tylenol for pain management. We discussed a potential referral for physical therapy which he has denied at this time.   He notes he is eating well.  He notes he has been having some balance issues. He was advised to use a walking cane or stick.  Does not report any significant toxicities from the daratumumab treatment.  Good p.o. intake.  No new bone bones. No new infection issues. No new focal pain or any other new or acute focal symptoms at this time.  Labs done today were reviewed in detail with her.  MEDICAL HISTORY:  Past Medical History:  Diagnosis Date   Anemia    Arthritis    shoulders,feet    Cataract    per eye dr -has appt 5-11   CKD (chronic kidney disease), stage III (HCC)    Elevated PSA    History of ketoacidosis    03-29-2015    History of sepsis    07-25-2013  non-compliant w/ medication   Hyperlipidemia    Hypertension    Nocturia    Peripheral neuropathy    Prostate cancer ([place])    Type 2 diabetes mellitus with insulin therapy [facility])     SURGICAL HISTORY: Past Surgical History:  Procedure Laterality Date   CIRCUMCISION  1990's   PROSTATE BIOPSY N/A 12/21/2015   Procedure: BIOPSY TRANSRECTAL ULTRASONIC PROSTATE (TUBP);  Surgeon: Festus Aloe, MD;  Location: [facility];  Service: Urology;  Laterality: N/A;    SOCIAL HISTORY: Social History   Socioeconomic History   Marital status: Single    Spouse name: Not on file  Number of children: Not on file   Years of education: Not on file   Highest education level: Not on file  Occupational History   Not on file  Tobacco Use   Smoking status: Every Day    Packs/day: 0.50    Years: 50.00    Total pack years: 25.00    Types: Cigarettes   Smokeless tobacco: Never   Tobacco comments:    1  ppwk-4-5 a day   Vaping Use   Vaping Use: Never used  Substance and Sexual Activity   Alcohol use: Yes    Alcohol/week: 1.0 standard drink of alcohol    Types: 1 Cans of beer per week    Comment: 1 every day-quit couple weeks ago   Drug use: No   Sexual activity: Not Currently  Other Topics Concern   Not on file  Social History Narrative   Not on file   Social Determinants of Health   Financial Resource Strain: Not on file  Food Insecurity: Not on file  Transportation Needs: Not on file  Physical Activity: Not on file  Stress: Not on file  Social Connections: Not on file  Intimate Partner Violence: Not on file    FAMILY HISTORY: Family History  Problem Relation Age of Onset   Kidney disease Mother    Cancer Neg Hx    Colon cancer Neg Hx    Colon polyps Neg Hx    Esophageal cancer Neg Hx    Rectal cancer Neg Hx    Stomach cancer Neg Hx     ALLERGIES:  is allergic to other.  MEDICATIONS:  Current Outpatient Medications  Medication Sig Dispense Refill   Accu-Chek Softclix Lancets lancets Use to test blood sugar 3 times daily. 100 each 3   acyclovir (ZOVIRAX) 400 MG tablet Take 1 tablet (400 mg total) by mouth 2 (two) times daily. 60 tablet 11   amLODipine (NORVASC) 10 MG tablet TAKE 1 TABLET (10 MG TOTAL) BY MOUTH DAILY. 90 tablet 3   b complex vitamins capsule Take 1 capsule by mouth daily. 30 capsule 5   Blood Glucose Monitoring Suppl (ACCU-CHEK GUIDE) w/Device KIT Use to test blood sugar three times daily 1 kit 0   Continuous Blood Gluc Receiver (FREESTYLE LIBRE READER) DEVI Use to check blood sugar 3x daily. E11.42 1 each 0   Continuous Blood Gluc Sensor (FREESTYLE LIBRE SENSOR SYSTEM) MISC Use to check blood sugar 3x daily. Change sensor Q 2 wks. E11.42 2 each 12   dexamethasone (DECADRON) 4 MG tablet 22m (5 tabs) Take the day after each dose of daratumumab. Take with breakfast 20 tablet 11   diclofenac Sodium (VOLTAREN) 1 % GEL Apply 4 g topically 4 (four) times  daily. 150 g 5   donepezil (ARICEPT) 5 MG tablet TAKE 1 TABLET (5 MG TOTAL) BY MOUTH AT BEDTIME. FOR MEMORY ISSUES 30 tablet 5   ferrous sulfate 325 (65 FE) MG tablet Take 325 mg by mouth daily.     gabapentin (NEURONTIN) 300 MG capsule TAKE 1 CAPSULE BY MOUTH IN THE MORNING AND LUNCH, AND 2 AT BEDTIME 120 capsule 6   glucose blood (ACCU-CHEK GUIDE) test strip Use to test blood sugar 3 times daily. 100 each 2   hydrOXYzine (ATARAX/VISTARIL) 25 MG tablet TAKE 1 TABLET (25 MG TOTAL) BY MOUTH 3 (THREE) TIMES DAILY AS NEEDED FOR ITCHING (RASH). 30 tablet 0   insulin aspart protamine - aspart (NOVOLOG MIX 70/30 FLEXPEN) (70-30) 100 UNIT/ML FlexPen Inject 6 units  twice a day with meals.  For 3 days after taking Dexamethazone, increase to 9 units twice a day into the skin. 15 mL 11   Insulin Pen Needle (BD PEN NEEDLE NANO U/F) 32G X 4 MM MISC USE AS DIRECTED TWICE DAILY WITH INSULIN 100 each 6   lidocaine (LIDODERM) 5 %      lisinopril (ZESTRIL) 2.5 MG tablet Take 1 tablet (2.5 mg total) by mouth daily. 30 tablet 4   Miconazole Nitrate (ATHLETES FOOT POWDER SPRAY) 2 % AERP Spray between toes and under toes once daily. 130 g 4   Multiple Vitamin (MULTIVITAMIN WITH MINERALS) TABS tablet Take 1 tablet by mouth daily. 60 tablet 2   ondansetron (ZOFRAN) 8 MG tablet Take 1 tablet (8 mg total) by mouth 2 (two) times daily as needed (Nausea or vomiting). 30 tablet 1   pravastatin (PRAVACHOL) 20 MG tablet TAKE 1 TABLET (20 MG TOTAL) BY MOUTH DAILY. 90 tablet 2   prochlorperazine (COMPAZINE) 10 MG tablet Take 1 tablet (10 mg total) by mouth every 6 (six) hours as needed (Nausea or vomiting). 30 tablet 1   tadalafil (CIALIS) 20 MG tablet Take 20 mg by mouth as needed.     tiZANidine (ZANAFLEX) 2 MG tablet Take 1 tablet (2 mg total) by mouth at bedtime as needed for muscle spasms. 30 tablet 1   vitamin B-12 (CYANOCOBALAMIN) 100 MCG tablet Take 100 mcg by mouth daily.     No current facility-administered medications  for this visit.    ROS 10 Point review of Systems was done is negative except as noted above.   PHYSICAL EXAMINATION: .There were no vitals taken for this visit. NAD GENERAL:alert, in no acute distress and comfortable SKIN: no acute rashes, no significant lesions EYES: conjunctiva are pink and non-injected, sclera anicteric NECK: supple, no JVD LYMPH:  no palpable lymphadenopathy in the cervical, axillary or inguinal regions LUNGS: clear to auscultation b/l with normal respiratory effort HEART: regular rate & rhythm ABDOMEN:  normoactive bowel sounds , non tender, not distended. Extremity: no pedal edema PSYCH: alert & oriented x 3 with fluent speech NEURO: no focal motor/sensory deficits  LABORATORY DATA:  I have reviewed the data as listed  .    Latest Ref Rng & Units 12/15/2021    7:56 AM 12/01/2021    7:43 AM 11/17/2021    8:22 AM  CBC  WBC 4.0 - 10.5 K/uL 5.5  5.5  5.9   Hemoglobin 13.0 - 17.0 g/dL 12.7  13.3  13.2   Hematocrit 39.0 - 52.0 % 36.0  37.7  36.8   Platelets 150 - 400 K/uL 203  182  196       Latest Ref Rng & Units 12/15/2021    7:56 AM 12/01/2021    7:43 AM 11/17/2021    8:22 AM  CMP  Glucose 70 - 99 mg/dL 131  118  193   BUN 8 - 23 mg/dL '17  17  19   ' Creatinine 0.61 - 1.24 mg/dL 1.28  1.38  1.40   Sodium 135 - 145 mmol/L 137  138  135   Potassium 3.5 - 5.1 mmol/L 3.6  3.5  3.8   Chloride 98 - 111 mmol/L 107  105  102   CO2 22 - 32 mmol/L '22  24  23   ' Calcium 8.9 - 10.3 mg/dL 9.8  9.3  9.1   Total Protein 6.5 - 8.1 g/dL 6.5  6.6  7.0   Total Bilirubin 0.3 -  1.2 mg/dL 0.8  0.9  1.2   Alkaline Phos 38 - 126 U/L 69  60  61   AST 15 - 41 U/L '27  28  28   ' ALT 0 - 44 U/L 44  44  41      08/29/17 BM Bx:    08/29/17 Cytogenetics:   03/13/17 Cytogenetics:        PATHOLOGY Surgical Pathology  CASE: [id number]  PATIENT: Robert Sparks  Bone Marrow Report      Clinical History: Multiple myeloma in relapse ([place]) ,([facility])      DIAGNOSIS:    BONE MARROW, ASPIRATE, CLOT, CORE:  -Hypercellular bone marrow for age with plasma cell neoplasm  -See comment   PERIPHERAL BLOOD:  -Mild normocytic-normochromic anemia  -Eosinophilia   COMMENT:   The bone marrow is hypercellular for age with increased number of plasma  cells representing 8% of all cells in the aspirate associated with  numerous small clusters in the clot and biopsy sections. The plasma  cells display lambda light chain restriction consistent with plasma cell  neoplasm.  The background shows trilineage hematopoiesis with generally  nonspecific myeloid changes, likely secondary in nature in this setting.  Correlation with cytogenetic and FISH studies is recommended.      RADIOGRAPHIC STUDIES: I have personally reviewed the radiological images as listed and agreed with the findings in the report. No results found.  ASSESSMENT & PLAN:   76 y.o. male with   1.  Relapsed IgA Lambda Multiple Myeloma ISS Stage I    Active disease was previously diagnosed based on presence of anemia, kidney insufficiency, and paraproteinemia with significant predominance of lambda light chains as well as significant elevation of IgA. Patient is status post patient was treated with induction bortezomib Revlimid dexamethasone.  Had issues with skin rashes with Revlimid and this was discontinued.  Completed bortezomib dexamethasone induction and achieved complete remission. Was on maintenance Velcade 2 weeks which was then switched to maintenance Ninlaro. Patient had issues with worsening neuropathy which was a combination of Velcade and his diabetes.  Ninlaro was discontinued as well after maintenance of more than 2 years.  2.  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 Bone marrow biopsy shows 8% lambda restricted plasma cells PET CT scan with no hypermetabolic osseous lesions  3.   Hypertension 4.  Diabetes type 2 5 .chronic kidney disease due to hypertension and diabetes. 6.  Peripheral neuropathy related to diabetes and previous myeloma treatments.  Plan -Patient's labs from today were discussed in detail with him and are stable. CBC and CMP stable. Patient's myeloma panel from 12/01/2021 shows improvement in M protein down to 0.1 g/dL and normal light chain ratio. Myeloma labs from today show a bump in his M protein to 0.5 g/dL with an additional spike of 0.2 g/dL of IgG kappa Kappa lambda light chain ratio still remains normal. -No notable toxicities from the patient's current daratumumab/dexamethasone regimen. -In light of increasing M protein we shall increase the daratumumab back to every 2 weeks with same supportive medications. -We will also plan to add pomalidomide at 3 mg 21 days on 1 week off. (Patient previously had rash to Revlimid needing discontinuation) -We will need to be on aspirin 81 mg p.o. daily for VTE prophylaxis Continue acyclovir for VZV prophylaxis - He was advised to use a walking cane or stick for added balance support.  FOLLOW UP: We  will need to schedule next 6 cycles of daratumumab every 2 weeks with port flush and labs MD visit in 6 weeks   The total time spent in the appointment was 30 minutes*.  All of the patient's questions were answered with apparent satisfaction. The patient knows to call the clinic with any problems, questions or concerns.   Sullivan Lone MD MS AAHIVMS [facility] Hematology/Oncology Physician [facility]  .*Total Encounter Time as defined by the Centers for Medicare and Medicaid Services includes, in addition to the face-to-face time of a patient visit (documented in the note above) non-face-to-face time: obtaining and reviewing outside history, ordering and reviewing medications, tests or procedures, care coordination (communications with other health care professionals or caregivers) and  documentation in the medical record.  I, Melene Muller, am acting as scribe for Dr. Sullivan Lone, MD. .I have reviewed the above documentation for accuracy and completeness, and I agree with the above. Brunetta Genera MD

## 2022-01-02 ENCOUNTER — Telehealth: Payer: Self-pay | Admitting: Hematology

## 2022-01-02 ENCOUNTER — Encounter: Payer: Self-pay | Admitting: Hematology

## 2022-01-02 ENCOUNTER — Other Ambulatory Visit: Payer: Self-pay

## 2022-01-02 ENCOUNTER — Encounter: Payer: Self-pay | Admitting: Internal Medicine

## 2022-01-02 ENCOUNTER — Ambulatory Visit: Payer: Medicare HMO | Attending: Internal Medicine | Admitting: Internal Medicine

## 2022-01-02 VITALS — BP 119/78 | HR 75 | Temp 98.2°F | Resp 16 | Wt 159.0 lb

## 2022-01-02 DIAGNOSIS — N1831 Chronic kidney disease, stage 3a: Secondary | ICD-10-CM | POA: Diagnosis not present

## 2022-01-02 DIAGNOSIS — E1169 Type 2 diabetes mellitus with other specified complication: Secondary | ICD-10-CM

## 2022-01-02 DIAGNOSIS — G8929 Other chronic pain: Secondary | ICD-10-CM

## 2022-01-02 DIAGNOSIS — M25511 Pain in right shoulder: Secondary | ICD-10-CM | POA: Diagnosis not present

## 2022-01-02 DIAGNOSIS — C9000 Multiple myeloma not having achieved remission: Secondary | ICD-10-CM

## 2022-01-02 DIAGNOSIS — I10 Essential (primary) hypertension: Secondary | ICD-10-CM

## 2022-01-02 DIAGNOSIS — F17200 Nicotine dependence, unspecified, uncomplicated: Secondary | ICD-10-CM | POA: Diagnosis not present

## 2022-01-02 DIAGNOSIS — E1142 Type 2 diabetes mellitus with diabetic polyneuropathy: Secondary | ICD-10-CM

## 2022-01-02 DIAGNOSIS — E785 Hyperlipidemia, unspecified: Secondary | ICD-10-CM | POA: Diagnosis not present

## 2022-01-02 LAB — KAPPA/LAMBDA LIGHT CHAINS
Kappa free light chain: 32.3 mg/L — ABNORMAL HIGH (ref 3.3–19.4)
Kappa, lambda light chain ratio: 0.68 (ref 0.26–1.65)
Lambda free light chains: 47.4 mg/L — ABNORMAL HIGH (ref 5.7–26.3)

## 2022-01-02 LAB — POCT GLYCOSYLATED HEMOGLOBIN (HGB A1C): HbA1c, POC (controlled diabetic range): 8.4 % — AB (ref 0.0–7.0)

## 2022-01-02 LAB — GLUCOSE, POCT (MANUAL RESULT ENTRY): POC Glucose: 110 mg/dl — AB (ref 70–99)

## 2022-01-02 MED ORDER — FREESTYLE LIBRE SENSOR SYSTEM MISC
12 refills | Status: DC
  Filled 2022-01-02: qty 2, 28d supply, fill #0

## 2022-01-02 MED ORDER — FREESTYLE LIBRE READER DEVI
0 refills | Status: DC
  Filled 2022-01-02: qty 1, 30d supply, fill #0

## 2022-01-02 NOTE — Progress Notes (Signed)
Patient ID: Robert Sparks, male    DOB: 09/17/1945  MRN: [id number]  CC: Diabetes   Subjective: Robert Sparks is a 76 y.o. male who presents for chronic ds management His concerns today include:  Patient with history of HTN, DM 2 with polyneuropathy, HL, CKD stage 3, tob dependence, EtOH abuse, prostate CA, OA knee, vit B 12 def, spinal stenosis, and multiple myeloma in remission, memory changes MMSE 24/30 2021).    Still get treatment with Daratumumab/Dexa once a mth for treatment of relapse multiple myeloma.  DM  Results for orders placed or performed in visit on 01/02/22  Glucose (CBG)  Result Value Ref Range   POC Glucose 110 (A) 70 - 99 mg/dl  HgB W0J  Result Value Ref Range   Hemoglobin A1C     HbA1c POC (<> result, manual entry)     HbA1c, POC (prediabetic range)     HbA1c, POC (controlled diabetic range) 8.4 (A) 0.0 - 7.0 %  A1c has increased since last visit.  We had him increase Humalog 70/30 from 6 units twice a day to 9 units twice a day for 3 to 4 days after he takes dexamethasone.  He has been doing that.  Prescribe libre device.  Patient states that he checked with [facility] several times but they were out of stock so he never got it.  He has been using his glucometer. Checking BS 3x/day.  Did not bring log with him. Reports readings up to 200 when he takes the Dexamethasone Has appt with Dr. Dione Booze next mth.  C/o pain RT shoulder with certain movements x several mths. No injury.   No pain when laying down at nights  HTN:  BP a little low today.  Saw nephrology last wk and BP was low then too. GFR stable in the 50s. No dizziness or CP. On Lisinopril 2.5 mg and Norvasc 10 mg which he took already today.  Limits salt in the foods  HL: compliant with Pravachol.  Tob dep:  still smoking 6-7 a day.  Not ready to check Patient Active Problem List   Diagnosis Date Noted   Port-A-Cath in place 11/02/2021   Macroalbuminuric diabetic nephropathy (HCC) 08/31/2021    Multiple myeloma in relapse (HCC) 06/24/2021   Counseling regarding advance care planning and goals of care 06/24/2021   Memory changes 04/25/2019   Hyperlipidemia associated with type 2 diabetes mellitus (HCC) 04/25/2019   Dementia associated with other underlying disease without behavioral disturbance (HCC) 03/14/2019   Chronic bilateral low back pain without sciatica 03/14/2019   Spinal stenosis, lumbar region with neurogenic claudication 03/14/2019   History of prostate cancer 12/20/2018   Neuropathy of right hand 12/20/2018   Primary osteoarthritis of right knee 01/07/2018   Diabetic polyneuropathy associated with type 2 diabetes mellitus (HCC) 01/07/2018   Vitamin B12 deficiency 09/04/2017   Cataract of both eyes 04/12/2017   Tobacco dependence 04/12/2017   Multiple myeloma in remission (HCC) 02/23/2017   Hyperlipidemia 06/21/2016   CKD (chronic kidney disease) stage 3, GFR 30-59 ml/min (HCC) 02/23/2016   Malignant neoplasm of prostate (HCC) 02/21/2016   Controlled type 2 diabetes mellitus with stage 3 chronic kidney disease, with long-term current use of insulin (HCC) 03/29/2015   HTN (hypertension) 07/28/2013   Anemia, chronic disease 07/26/2013   ETOH abuse 07/25/2013     Current Outpatient Medications on File Prior to Visit  Medication Sig Dispense Refill   Accu-Chek Softclix Lancets lancets Use to test blood sugar  3 times daily. 100 each 3   acyclovir (ZOVIRAX) 400 MG tablet Take 1 tablet (400 mg total) by mouth 2 (two) times daily. 60 tablet 11   amLODipine (NORVASC) 10 MG tablet TAKE 1 TABLET (10 MG TOTAL) BY MOUTH DAILY. 90 tablet 3   b complex vitamins capsule Take 1 capsule by mouth daily. 30 capsule 5   Blood Glucose Monitoring Suppl (ACCU-CHEK GUIDE) w/Device KIT Use to test blood sugar three times daily 1 kit 0   dexamethasone (DECADRON) 4 MG tablet 20mg  (5 tabs) Take the day after each dose of daratumumab. Take with breakfast 20 tablet 11   diclofenac Sodium  (VOLTAREN) 1 % GEL Apply 4 g topically 4 (four) times daily. 150 g 5   donepezil (ARICEPT) 5 MG tablet TAKE 1 TABLET (5 MG TOTAL) BY MOUTH AT BEDTIME. FOR MEMORY ISSUES 30 tablet 5   ferrous sulfate 325 (65 FE) MG tablet Take 325 mg by mouth daily.     gabapentin (NEURONTIN) 300 MG capsule TAKE 1 CAPSULE BY MOUTH IN THE MORNING AND LUNCH, AND 2 AT BEDTIME 120 capsule 6   glucose blood (ACCU-CHEK GUIDE) test strip Use to test blood sugar 3 times daily. 100 each 2   hydrOXYzine (ATARAX/VISTARIL) 25 MG tablet TAKE 1 TABLET (25 MG TOTAL) BY MOUTH 3 (THREE) TIMES DAILY AS NEEDED FOR ITCHING (RASH). 30 tablet 0   insulin aspart protamine - aspart (NOVOLOG MIX 70/30 FLEXPEN) (70-30) 100 UNIT/ML FlexPen Inject 6 units twice a day with meals.  For 3 days after taking Dexamethazone, increase to 9 units twice a day into the skin. 15 mL 11   Insulin Pen Needle (BD PEN NEEDLE NANO U/F) 32G X 4 MM MISC USE AS DIRECTED TWICE DAILY WITH INSULIN 100 each 6   lidocaine (LIDODERM) 5 %      lisinopril (ZESTRIL) 2.5 MG tablet Take 1 tablet (2.5 mg total) by mouth daily. 30 tablet 4   Miconazole Nitrate (ATHLETES FOOT POWDER SPRAY) 2 % AERP Spray between toes and under toes once daily. 130 g 4   Multiple Vitamin (MULTIVITAMIN WITH MINERALS) TABS tablet Take 1 tablet by mouth daily. 60 tablet 2   ondansetron (ZOFRAN) 8 MG tablet Take 1 tablet (8 mg total) by mouth 2 (two) times daily as needed (Nausea or vomiting). 30 tablet 1   pravastatin (PRAVACHOL) 20 MG tablet TAKE 1 TABLET (20 MG TOTAL) BY MOUTH DAILY. 90 tablet 2   prochlorperazine (COMPAZINE) 10 MG tablet Take 1 tablet (10 mg total) by mouth every 6 (six) hours as needed (Nausea or vomiting). 30 tablet 1   tadalafil (CIALIS) 20 MG tablet Take 20 mg by mouth as needed.     tiZANidine (ZANAFLEX) 2 MG tablet Take 1 tablet (2 mg total) by mouth at bedtime as needed for muscle spasms. 30 tablet 1   vitamin B-12 (CYANOCOBALAMIN) 100 MCG tablet Take 100 mcg by mouth  daily.     No current facility-administered medications on file prior to visit.    Allergies  Allergen Reactions   Other Other (See Comments)    Nicoderm CQ = Bad Dreams Nicotine gum = Bad Dreams     Social History   Socioeconomic History   Marital status: Single    Spouse name: Not on file   Number of children: Not on file   Years of education: Not on file   Highest education level: Not on file  Occupational History   Not on file  Tobacco Use  Smoking status: Every Day    Packs/day: 0.50    Years: 50.00    Total pack years: 25.00    Types: Cigarettes   Smokeless tobacco: Never   Tobacco comments:    1 ppwk-4-5 a day   Vaping Use   Vaping Use: Never used  Substance and Sexual Activity   Alcohol use: Yes    Alcohol/week: 1.0 standard drink of alcohol    Types: 1 Cans of beer per week    Comment: 1 every day-quit couple weeks ago   Drug use: No   Sexual activity: Not Currently  Other Topics Concern   Not on file  Social History Narrative   Not on file   Social Determinants of Health   Financial Resource Strain: Not on file  Food Insecurity: Not on file  Transportation Needs: Not on file  Physical Activity: Not on file  Stress: Not on file  Social Connections: Not on file  Intimate Partner Violence: Not on file    Family History  Problem Relation Age of Onset   Kidney disease Mother    Cancer Neg Hx    Colon cancer Neg Hx    Colon polyps Neg Hx    Esophageal cancer Neg Hx    Rectal cancer Neg Hx    Stomach cancer Neg Hx     Past Surgical History:  Procedure Laterality Date   CIRCUMCISION  1990's   PROSTATE BIOPSY N/A 12/21/2015   Procedure: BIOPSY TRANSRECTAL ULTRASONIC PROSTATE (TUBP);  Surgeon: Jerilee Field, MD;  Location: [facility];  Service: Urology;  Laterality: N/A;    ROS: Review of Systems Negative except as stated above  PHYSICAL EXAM: BP 119/78   Pulse 75   Temp 98.2 F (36.8 C) (Oral)   Resp 16   Wt 159  lb (72.1 kg)   SpO2 97%   BMI 24.90 kg/m   Physical Exam   General appearance - alert, well appearing, and in no distress Mental status - normal mood, behavior, speech, dress, motor activity, and thought processes Neck - supple, no significant adenopathy Chest - clear to auscultation, no wheezes, rales or rhonchi, symmetric air entry Heart - normal rate, regular rhythm, normal S1, S2, no murmurs, rubs, clicks or gallops Extremities - peripheral pulses normal, no pedal edema, no clubbing or cyanosis MSK: Right shoulder-no point tenderness.  He has decreased range of motion with backward movement and elevation    Latest Ref Rng & Units 12/30/2021    8:53 AM 12/15/2021    7:56 AM 12/01/2021    7:43 AM  CMP  Glucose 70 - 99 mg/dL 829  562  130   BUN 8 - 23 mg/dL 13  17  17    Creatinine 0.61 - 1.24 mg/dL 8.65  7.84  6.96   Sodium 135 - 145 mmol/L 138  137  138   Potassium 3.5 - 5.1 mmol/L 3.8  3.6  3.5   Chloride 98 - 111 mmol/L 107  107  105   CO2 22 - 32 mmol/L 24  22  24    Calcium 8.9 - 10.3 mg/dL 9.6  9.8  9.3   Total Protein 6.5 - 8.1 g/dL 6.7  6.5  6.6   Total Bilirubin 0.3 - 1.2 mg/dL 0.8  0.8  0.9   Alkaline Phos 38 - 126 U/L 58  69  60   AST 15 - 41 U/L 20  27  28    ALT 0 - 44 U/L 24  44  44    Lipid Panel     Component Value Date/Time   CHOL 184 04/25/2019 0939   TRIG 114 04/25/2019 0939   HDL 88 04/25/2019 0939   CHOLHDL 2.1 04/25/2019 0939   CHOLHDL 3.6 06/21/2016 1047   VLDL 50 (H) 06/21/2016 1047   LDLCALC 76 04/25/2019 0939    CBC    Component Value Date/Time   WBC 5.6 12/30/2021 0853   WBC 5.6 07/13/2021 0741   RBC 3.50 (L) 12/30/2021 0853   HGB 12.8 (L) 12/30/2021 0853   HGB 12.2 (L) 12/06/2017 0920   HGB 10.1 (L) 07/13/2017 0938   HCT 35.7 (L) 12/30/2021 0853   HCT 37.1 (L) 12/06/2017 0920   HCT 30.6 (L) 07/13/2017 0938   PLT 185 12/30/2021 0853   PLT 250 12/06/2017 0920   MCV 102.0 (H) 12/30/2021 0853   MCV 98 (H) 12/06/2017 0920   MCV 96.2  07/13/2017 0938   MCH 36.6 (H) 12/30/2021 0853   MCHC 35.9 12/30/2021 0853   RDW 13.2 12/30/2021 0853   RDW 14.1 12/06/2017 0920   RDW 14.4 07/13/2017 0938   LYMPHSABS 1.1 12/30/2021 0853   LYMPHSABS 0.8 (L) 07/13/2017 0938   MONOABS 0.4 12/30/2021 0853   MONOABS 0.4 07/13/2017 0938   EOSABS 0.5 12/30/2021 0853   EOSABS 0.5 07/13/2017 0938   EOSABS 0.8 (H) 11/21/2016 1416   BASOSABS 0.0 12/30/2021 0853   BASOSABS 0.0 07/13/2017 0938    ASSESSMENT AND PLAN:  1. Type 2 diabetes mellitus with peripheral neuropathy (HCC) Not at goal associated with high dose steroid use once a mth with chemo. Not enough date to make further adjustment in insulin Will send rxn to our pharmacy to see if we have the [place] device for him.  F/u with clinical pharm in 3 wks with his Libre device or his glucometer with readings if his insurance does not cover the former - Glucose (CBG) - HgB A1c - Continuous Blood Gluc Receiver (FREESTYLE LIBRE READER) DEVI; Use to check blood sugar 3x daily. E11.42  Dispense: 1 each; Refill: 0 - Continuous Blood Gluc Sensor (FREESTYLE LIBRE SENSOR SYSTEM) MISC; Use to check blood sugar 3 times daily. Change sensor every 2 weeks.  Dispense: 2 each; Refill: 12  2. Essential hypertension At goal Repeat BP check normal and not low  3. Stage 3a chronic kidney disease (HCC) stable  4. Tobacco dependence Continue to advise smoking cessation.  Not ready to quit  5. Hyperlipidemia associated with type 2 diabetes mellitus (HCC) Cont Pravachol  6. Chronic right shoulder pain Likley sub-acromial bursitis - Ambulatory referral to Orthopedic Surgery  7. Multiple myeloma not having achieved remission (HCC) Managemed by Dr. Candise Che.    Patient was given the opportunity to ask questions.  Patient verbalized understanding of the plan and was able to repeat key elements of the plan.   This documentation was completed using Paediatric nurse.  Any transcriptional  errors are unintentional.  Orders Placed This Encounter  Procedures   Ambulatory referral to Orthopedic Surgery   Glucose (CBG)   HgB A1c     Requested Prescriptions   Signed Prescriptions Disp Refills   Continuous Blood Gluc Receiver (FREESTYLE LIBRE READER) DEVI 1 each 0    Sig: Use to check blood sugar 3x daily. E11.42   Continuous Blood Gluc Sensor (FREESTYLE LIBRE SENSOR SYSTEM) MISC 2 each 12    Sig: Use to check blood sugar 3x daily. Change sensor Q 2 wks. E11.42  Return in about 4 months (around 05/04/2022) for Appt with [facility] in 3 wks for BS check.  Jonah Blue, MD, FACP

## 2022-01-02 NOTE — Progress Notes (Signed)
F/u DM and HTN Would like a dexcom device

## 2022-01-03 ENCOUNTER — Other Ambulatory Visit: Payer: Self-pay

## 2022-01-04 ENCOUNTER — Other Ambulatory Visit: Payer: Self-pay

## 2022-01-04 LAB — MULTIPLE MYELOMA PANEL, SERUM
Albumin SerPl Elph-Mcnc: 3.8 g/dL (ref 2.9–4.4)
Albumin/Glob SerPl: 1.6 (ref 0.7–1.7)
Alpha 1: 0.2 g/dL (ref 0.0–0.4)
Alpha2 Glob SerPl Elph-Mcnc: 0.8 g/dL (ref 0.4–1.0)
B-Globulin SerPl Elph-Mcnc: 0.9 g/dL (ref 0.7–1.3)
Gamma Glob SerPl Elph-Mcnc: 0.5 g/dL (ref 0.4–1.8)
Globulin, Total: 2.4 g/dL (ref 2.2–3.9)
IgA: 165 mg/dL (ref 61–437)
IgG (Immunoglobin G), Serum: 535 mg/dL — ABNORMAL LOW (ref 603–1613)
IgM (Immunoglobulin M), Srm: 40 mg/dL (ref 15–143)
M Protein SerPl Elph-Mcnc: 0.5 g/dL — ABNORMAL HIGH
Total Protein ELP: 6.2 g/dL (ref 6.0–8.5)

## 2022-01-05 ENCOUNTER — Other Ambulatory Visit (HOSPITAL_COMMUNITY): Payer: Self-pay

## 2022-01-05 ENCOUNTER — Encounter: Payer: Self-pay | Admitting: Hematology

## 2022-01-05 ENCOUNTER — Telehealth: Payer: Self-pay | Admitting: Pharmacy Technician

## 2022-01-05 ENCOUNTER — Telehealth: Payer: Self-pay | Admitting: Pharmacist

## 2022-01-05 ENCOUNTER — Other Ambulatory Visit: Payer: Self-pay

## 2022-01-05 ENCOUNTER — Other Ambulatory Visit: Payer: Self-pay | Admitting: Internal Medicine

## 2022-01-05 DIAGNOSIS — C9002 Multiple myeloma in relapse: Secondary | ICD-10-CM

## 2022-01-05 MED ORDER — ACCU-CHEK GUIDE VI STRP
ORAL_STRIP | 2 refills | Status: DC
  Filled 2022-01-05: qty 100, 33d supply, fill #0
  Filled 2022-01-30: qty 100, 33d supply, fill #1
  Filled 2022-02-20: qty 100, 33d supply, fill #2
  Filled 2022-02-24: qty 100, 25d supply, fill #2

## 2022-01-05 MED ORDER — POMALIDOMIDE 2 MG PO CAPS
2.0000 mg | ORAL_CAPSULE | Freq: Every day | ORAL | 1 refills | Status: DC
  Filled 2022-01-05: qty 21, 21d supply, fill #0

## 2022-01-05 NOTE — Telephone Encounter (Signed)
Oral Oncology Patient Advocate Encounter  Prior Authorization for Pomalyst has been approved.    PA# [id number] Effective dates: 01/05/2022 through 07/04/2022  Patients co-pay is $0.    [name], CPhT-Adv Pharmacy Patient [place] Patient Advocate Team Direct Number: [phone]  Fax: [phone]

## 2022-01-05 NOTE — Telephone Encounter (Signed)
Oral Oncology Patient Advocate Encounter   Received notification that prior authorization for Pomalyst is required.   PA submitted on 01/05/2022 [facility] Status is pending     [name], CPhT-Adv Pharmacy Patient Advocate Specialist [place] Patient Advocate Team Direct Number: [phone]  Fax: [phone]

## 2022-01-05 NOTE — Telephone Encounter (Signed)
Oral Oncology Pharmacist Encounter  Received new prescription for Pomalyst (pomalidomide) for the treatment of R/R multiple myeloma in conjunction with daratumumab and dexamethasone, planned duration until disease progression or unacceptable drug toxicity.  CMP and CBC w/ Diff from 12/30/21 assessed, noted pt with Scr of 1.30 mg/dL (CrCl ~51.6 mL/min). Prescription dose and frequency assessed for appropriateness. Dosing per MD.   Current medication list in Epic reviewed, no relevant/significant DDIs with Pomalyst identified.  Evaluated chart and no patient barriers to medication adherence noted.   Once [employer] authorization is obtained prescription will need to be e-scribed to [facility].   Oral Oncology Clinic will continue to follow for insurance authorization, copayment issues, initial counseling and start date.  Leron Croak, PharmD, BCPS Hematology/Oncology Clinical Pharmacist [facility] [phone] 01/05/2022 11:58 AM

## 2022-01-06 ENCOUNTER — Encounter: Payer: Self-pay | Admitting: Physician Assistant

## 2022-01-06 ENCOUNTER — Ambulatory Visit (INDEPENDENT_AMBULATORY_CARE_PROVIDER_SITE_OTHER): Payer: Medicare HMO

## 2022-01-06 ENCOUNTER — Ambulatory Visit (INDEPENDENT_AMBULATORY_CARE_PROVIDER_SITE_OTHER): Payer: Medicare HMO | Admitting: Physician Assistant

## 2022-01-06 DIAGNOSIS — M25511 Pain in right shoulder: Secondary | ICD-10-CM | POA: Diagnosis not present

## 2022-01-06 DIAGNOSIS — G8929 Other chronic pain: Secondary | ICD-10-CM

## 2022-01-06 MED ORDER — BUPIVACAINE HCL 0.25 % IJ SOLN
2.0000 mL | INTRAMUSCULAR | Status: AC | PRN
  Administered 2022-01-06: 2 mL via INTRA_ARTICULAR

## 2022-01-06 MED ORDER — LIDOCAINE HCL 1 % IJ SOLN
2.0000 mL | INTRAMUSCULAR | Status: AC | PRN
  Administered 2022-01-06: 2 mL

## 2022-01-06 MED ORDER — METHYLPREDNISOLONE ACETATE 40 MG/ML IJ SUSP
80.0000 mg | INTRAMUSCULAR | Status: AC | PRN
  Administered 2022-01-06: 80 mg via INTRA_ARTICULAR

## 2022-01-06 NOTE — Progress Notes (Signed)
Office Visit Note   Patient: Robert Sparks           Date of Birth: 1945/10/05           MRN: [id number] Visit Date: 01/06/2022              Requested by: [name], MD [facility] [place],  [place] [address] PCP: [name], MD  Chief Complaint  Patient presents with  . Right Shoulder - Pain      HPI: Patient is a pleasant 76 year old gentleman who presents today with a month history of right shoulder pain.  He denies any injuries.  He denies any fever or chills.  He does have a history of multiple myeloma although he is currently in remission and getting treatments once a month per his report.  He also is a diabetic with a hemoglobin A1c of 8.  He says he has a history of arthritis.  Assessment & Plan: Visit Diagnoses:  1. Chronic right shoulder pain     Plan: He has quite a bit of AC arthritis.  Motion is good no signs of adhesive capsulitis.  I discussed with him possibly an injection today as well as physical therapy.  He would like to first just try the injection.  Cautioned him about a transient rise in his blood sugars and check it carefully follow-up in 3 weeks but can skip this appointment if he is doing well  Follow-Up Instructions: No follow-ups on file.   Ortho Exam  Patient is alert, oriented, no adenopathy, well-dressed, normal affect, normal respiratory effort. Examination of his right shoulder no redness no cellulitis in his shoulder he is actually has good full forward elevation he has internal rotation behind his back mild impingement findings mild positive empty can test and positive speeds test strength is 5 out of 5 in all planes  Imaging: XR Shoulder Right  Result Date: 01/06/2022 Multiple projections of the right shoulder taken today no acute fractures or osseous changes.  Does have advanced degenerative changes of the [facility] joint with inferior hypertrophic bone and sclerotic changes humerus is well reduced in the glenoid  No  images are attached to the encounter.  Labs: Lab Results  Component Value Date   HGBA1C 8.4 (A) 01/02/2022   HGBA1C 8.0 (A) 08/29/2021   HGBA1C 7.2 (A) 01/04/2021   LABURIC 6.1 03/16/2017   LABURIC 6.7 10/02/2016   REPTSTATUS 05/04/2018 FINAL 05/02/2018   CULT  05/02/2018    NO GROWTH Performed at [facility], [place] [address] [place], [place] [address]      Lab Results  Component Value Date   ALBUMIN 4.3 12/30/2021   ALBUMIN 4.1 12/15/2021   ALBUMIN 4.2 12/01/2021    Lab Results  Component Value Date   MG 2.1 05/04/2018   MG 2.5 (H) 01/09/2018   MG 2.4 11/02/2017   Lab Results  Component Value Date   VD25OH 24.52 (L) 02/18/2020   VD25OH 28 (L) 06/21/2016    No results found for: "PREALBUMIN"    Latest Ref Rng & Units 12/30/2021    8:53 AM 12/15/2021    7:56 AM 12/01/2021    7:43 AM  CBC EXTENDED  WBC 4.0 - 10.5 K/uL 5.6  5.5  5.5   RBC 4.22 - 5.81 MIL/uL 3.50  3.59  3.74   Hemoglobin 13.0 - 17.0 g/dL 12.8  12.7  13.3   HCT 39.0 - 52.0 % 35.7  36.0  37.7   Platelets 150 - 400 K/uL 185  203  182   NEUT# 1.7 - 7.7 K/uL 3.5  3.4  3.6   Lymph# 0.7 - 4.0 K/uL 1.1  1.2  1.2      There is no height or weight on file to calculate BMI.  Orders:  Orders Placed This Encounter  Procedures  . XR Shoulder Right   No orders of the defined types were placed in this encounter.    Procedures: Large Joint Inj: R subacromial bursa on 01/06/2022 2:01 PM Indications: diagnostic evaluation and pain Details: 25 G 1.5 in needle, posterior approach  Arthrogram: No  Medications: 2 mL lidocaine 1 %; 80 mg methylPREDNISolone acetate 40 MG/ML; 2 mL bupivacaine 0.25 % Outcome: tolerated well, no immediate complications Procedure, treatment alternatives, risks and benefits explained, specific risks discussed. Consent was given by the patient.     Clinical Data: No additional findings.  ROS:  All other systems negative, except as noted in the HPI. Review of  Systems  Objective: Vital Signs: There were no vitals taken for this visit.  Specialty Comments:  No specialty comments available.  PMFS History: Patient Active Problem List   Diagnosis Date Noted  . Port-A-Cath in place 11/02/2021  . Macroalbuminuric diabetic nephropathy ([place]) 08/31/2021  . Multiple myeloma in relapse ([place]) 06/24/2021  . Counseling regarding advance care planning and goals of care 06/24/2021  . Memory changes 04/25/2019  . Hyperlipidemia associated with type 2 diabetes mellitus ([place]) 04/25/2019  . Dementia associated with other underlying disease without behavioral disturbance ([place]) 03/14/2019  . Chronic bilateral low back pain without sciatica 03/14/2019  . Spinal stenosis, lumbar region with neurogenic claudication 03/14/2019  . History of prostate cancer 12/20/2018  . Neuropathy of right hand 12/20/2018  . Primary osteoarthritis of right knee 01/07/2018  . Diabetic polyneuropathy associated with type 2 diabetes mellitus ([place]) 01/07/2018  . Vitamin B12 deficiency 09/04/2017  . Cataract of both eyes 04/12/2017  . Tobacco dependence 04/12/2017  . Multiple myeloma in remission ([place]) 02/23/2017  . Hyperlipidemia 06/21/2016  . CKD (chronic kidney disease) stage 3, GFR 30-59 ml/min (HCC) 02/23/2016  . Malignant neoplasm of prostate ([place]) 02/21/2016  . Controlled type 2 diabetes mellitus with stage 3 chronic kidney disease, with long-term current use of insulin ([place]) 03/29/2015  . HTN (hypertension) 07/28/2013  . Anemia, chronic disease 07/26/2013  . ETOH abuse 07/25/2013   Past Medical History:  Diagnosis Date  . Anemia   . Arthritis    shoulders,feet   . Cataract    per eye dr -has appt 5-11  . CKD (chronic kidney disease), stage III ([place])   . Elevated PSA   . History of ketoacidosis    03-29-2015   . History of sepsis    07-25-2013  non-compliant w/ medication  . Hyperlipidemia   . Hypertension   . Nocturia   . Peripheral neuropathy   . Prostate  cancer ([place])   . Type 2 diabetes mellitus with insulin therapy ([place])     Family History  Problem Relation Age of Onset  . Kidney disease Mother   . Cancer Neg Hx   . Colon cancer Neg Hx   . Colon polyps Neg Hx   . Esophageal cancer Neg Hx   . Rectal cancer Neg Hx   . Stomach cancer Neg Hx     Past Surgical History:  Procedure Laterality Date  . CIRCUMCISION  1990's  . PROSTATE BIOPSY N/A 12/21/2015   Procedure: BIOPSY  TRANSRECTAL ULTRASONIC PROSTATE (TUBP);  Surgeon: Festus Aloe, MD;  Location: [facility];  Service: Urology;  Laterality: N/A;   Social History   Occupational History  . Not on file  Tobacco Use  . Smoking status: Every Day    Packs/day: 0.50    Years: 50.00    Total pack years: 25.00    Types: Cigarettes  . Smokeless tobacco: Never  . Tobacco comments:    1 ppwk-4-5 a day   Vaping Use  . Vaping Use: Never used  Substance and Sexual Activity  . Alcohol use: Yes    Alcohol/week: 1.0 standard drink of alcohol    Types: 1 Cans of beer per week    Comment: 1 every day-quit couple weeks ago  . Drug use: No  . Sexual activity: Not Currently

## 2022-01-19 ENCOUNTER — Encounter: Payer: Self-pay | Admitting: Hematology

## 2022-01-25 ENCOUNTER — Other Ambulatory Visit: Payer: Self-pay

## 2022-01-25 ENCOUNTER — Encounter: Payer: Self-pay | Admitting: Hematology

## 2022-01-25 DIAGNOSIS — C9002 Multiple myeloma in relapse: Secondary | ICD-10-CM

## 2022-01-26 ENCOUNTER — Other Ambulatory Visit: Payer: Self-pay

## 2022-01-26 ENCOUNTER — Inpatient Hospital Stay: Payer: Medicare Other | Attending: Hematology

## 2022-01-26 ENCOUNTER — Inpatient Hospital Stay: Payer: Medicare Other

## 2022-01-26 VITALS — BP 127/74 | HR 74 | Temp 98.1°F | Resp 16 | Wt 158.5 lb

## 2022-01-26 DIAGNOSIS — Z7189 Other specified counseling: Secondary | ICD-10-CM

## 2022-01-26 DIAGNOSIS — C9002 Multiple myeloma in relapse: Secondary | ICD-10-CM | POA: Insufficient documentation

## 2022-01-26 DIAGNOSIS — Z5112 Encounter for antineoplastic immunotherapy: Secondary | ICD-10-CM | POA: Diagnosis not present

## 2022-01-26 LAB — CMP (CANCER CENTER ONLY)
ALT: 23 U/L (ref 0–44)
AST: 22 U/L (ref 15–41)
Albumin: 4 g/dL (ref 3.5–5.0)
Alkaline Phosphatase: 60 U/L (ref 38–126)
Anion gap: 7 (ref 5–15)
BUN: 17 mg/dL (ref 8–23)
CO2: 21 mmol/L — ABNORMAL LOW (ref 22–32)
Calcium: 9 mg/dL (ref 8.9–10.3)
Chloride: 107 mmol/L (ref 98–111)
Creatinine: 1.17 mg/dL (ref 0.61–1.24)
GFR, Estimated: >60 mL/min (ref >60)
Glucose, Bld: 171 mg/dL — ABNORMAL HIGH (ref 70–99)
Potassium: 3.8 mmol/L (ref 3.5–5.1)
Sodium: 135 mmol/L (ref 135–145)
Total Bilirubin: 0.6 mg/dL (ref 0.3–1.2)
Total Protein: 6.6 g/dL (ref 6.5–8.1)

## 2022-01-26 LAB — CBC WITH DIFFERENTIAL (CANCER CENTER ONLY)
Abs Immature Granulocytes: 0.01 10*3/uL (ref 0.00–0.07)
Basophils Absolute: 0 10*3/uL (ref 0.0–0.1)
Basophils Relative: 1 %
Eosinophils Absolute: 0.2 10*3/uL (ref 0.0–0.5)
Eosinophils Relative: 4 %
HCT: 33.9 % — ABNORMAL LOW (ref 39.0–52.0)
Hemoglobin: 11.8 g/dL — ABNORMAL LOW (ref 13.0–17.0)
Immature Granulocytes: 0 %
Lymphocytes Relative: 20 %
Lymphs Abs: 1 10*3/uL (ref 0.7–4.0)
MCH: 34.9 pg — ABNORMAL HIGH (ref 26.0–34.0)
MCHC: 34.8 g/dL (ref 30.0–36.0)
MCV: 100.3 fL — ABNORMAL HIGH (ref 80.0–100.0)
Monocytes Absolute: 0.5 10*3/uL (ref 0.1–1.0)
Monocytes Relative: 10 %
Neutro Abs: 3.2 10*3/uL (ref 1.7–7.7)
Neutrophils Relative %: 65 %
Platelet Count: 211 10*3/uL (ref 150–400)
RBC: 3.38 MIL/uL — ABNORMAL LOW (ref 4.22–5.81)
RDW: 12.3 % (ref 11.5–15.5)
WBC Count: 4.9 10*3/uL (ref 4.0–10.5)
nRBC: 0 % (ref 0.0–0.2)

## 2022-01-26 MED ORDER — SODIUM CHLORIDE 0.9 % IV SOLN
16.0000 mg/kg | Freq: Once | INTRAVENOUS | Status: AC
  Administered 2022-01-26: 1200 mg via INTRAVENOUS
  Filled 2022-01-26: qty 60

## 2022-01-26 MED ORDER — DIPHENHYDRAMINE HCL 25 MG PO CAPS
50.0000 mg | ORAL_CAPSULE | Freq: Once | ORAL | Status: AC
  Administered 2022-01-26: 50 mg via ORAL
  Filled 2022-01-26: qty 2

## 2022-01-26 MED ORDER — FAMOTIDINE IN NACL 20-0.9 MG/50ML-% IV SOLN
20.0000 mg | Freq: Once | INTRAVENOUS | Status: AC
  Administered 2022-01-26: 20 mg via INTRAVENOUS
  Filled 2022-01-26: qty 50

## 2022-01-26 MED ORDER — SODIUM CHLORIDE 0.9 % IV SOLN: Freq: Once | INTRAVENOUS | Status: AC

## 2022-01-26 MED ORDER — SODIUM CHLORIDE 0.9 % IV SOLN
20.0000 mg | Freq: Once | INTRAVENOUS | Status: AC
  Administered 2022-01-26: 20 mg via INTRAVENOUS
  Filled 2022-01-26: qty 20

## 2022-01-26 MED ORDER — ACETAMINOPHEN 325 MG PO TABS
650.0000 mg | ORAL_TABLET | Freq: Once | ORAL | Status: AC
  Administered 2022-01-26: 650 mg via ORAL
  Filled 2022-01-26: qty 2

## 2022-01-26 NOTE — Patient Instructions (Signed)
[facility]  Discharge Instructions: Thank you for choosing [facility] to provide your oncology and hematology care.   If you have a lab appointment with the [facility], please go directly to the [facility] and check in at the registration area.   Wear comfortable clothing and clothing appropriate for easy access to any Portacath or PICC line.   We strive to give you quality time with your provider. You may need to reschedule your appointment if you arrive late (15 or more minutes).  Arriving late affects you and other patients whose appointments are after yours.  Also, if you miss three or more appointments without notifying the office, you may be dismissed from the clinic at the provider's discretion.      For prescription refill requests, have your pharmacy contact our office and allow 72 hours for refills to be completed.    Today you received the following chemotherapy and/or immunotherapy agent: Darzalex      To help prevent nausea and vomiting after your treatment, we encourage you to take your nausea medication as directed.  BELOW ARE SYMPTOMS THAT SHOULD BE REPORTED IMMEDIATELY: *FEVER GREATER THAN 100.4 F (38 C) OR HIGHER *CHILLS OR SWEATING *NAUSEA AND VOMITING THAT IS NOT CONTROLLED WITH YOUR NAUSEA MEDICATION *UNUSUAL SHORTNESS OF BREATH *UNUSUAL BRUISING OR BLEEDING *URINARY PROBLEMS (pain or burning when urinating, or frequent urination) *BOWEL PROBLEMS (unusual diarrhea, constipation, pain near the anus) TENDERNESS IN MOUTH AND THROAT WITH OR WITHOUT PRESENCE OF ULCERS (sore throat, sores in mouth, or a toothache) UNUSUAL RASH, SWELLING OR PAIN  UNUSUAL VAGINAL DISCHARGE OR ITCHING   Items with * indicate a potential emergency and should be followed up as soon as possible or go to the Emergency Department if any problems should occur.  Please show the CHEMOTHERAPY ALERT CARD or IMMUNOTHERAPY ALERT CARD at check-in to  the Emergency Department and triage nurse.  Should you have questions after your visit or need to cancel or reschedule your appointment, please contact [facility]  Dept: [phone]  and follow the prompts.  Office hours are 8:00 a.m. to 4:30 p.m. Monday - Friday. Please note that voicemails left after 4:00 p.m. may not be returned until the following business day.  We are closed weekends and major holidays. You have access to a nurse at all times for urgent questions. Please call the main number to the clinic Dept: [phone] and follow the prompts.   For any non-urgent questions, you may also contact your provider using MyChart. We now offer e-Visits for anyone 34 and older to request care online for non-urgent symptoms. For details visit [email or web address].   Also download the MyChart app! Go to the app store, search "MyChart", open the app, select Outlook, and log in with your MyChart username and password.  Masks are optional in the cancer centers. If you would like for your care team to wear a mask while they are taking care of you, please let them know. For doctor visits, patients may have with them one support person who is at least 74 years old. At this time, visitors are not allowed in the infusion area.

## 2022-01-27 ENCOUNTER — Encounter: Payer: Self-pay | Admitting: Hematology

## 2022-01-27 ENCOUNTER — Ambulatory Visit: Payer: Medicare HMO | Admitting: Physician Assistant

## 2022-01-27 ENCOUNTER — Other Ambulatory Visit: Payer: Self-pay

## 2022-01-27 LAB — KAPPA/LAMBDA LIGHT CHAINS
Kappa free light chain: 34.5 mg/L — ABNORMAL HIGH (ref 3.3–19.4)
Kappa, lambda light chain ratio: 0.48 (ref 0.26–1.65)
Lambda free light chains: 72.1 mg/L — ABNORMAL HIGH (ref 5.7–26.3)

## 2022-01-30 ENCOUNTER — Other Ambulatory Visit: Payer: Self-pay

## 2022-01-31 ENCOUNTER — Other Ambulatory Visit: Payer: Self-pay

## 2022-01-31 DIAGNOSIS — Z794 Long term (current) use of insulin: Secondary | ICD-10-CM | POA: Diagnosis not present

## 2022-01-31 DIAGNOSIS — E119 Type 2 diabetes mellitus without complications: Secondary | ICD-10-CM | POA: Diagnosis not present

## 2022-01-31 DIAGNOSIS — H25813 Combined forms of age-related cataract, bilateral: Secondary | ICD-10-CM | POA: Diagnosis not present

## 2022-01-31 DIAGNOSIS — H40013 Open angle with borderline findings, low risk, bilateral: Secondary | ICD-10-CM | POA: Diagnosis not present

## 2022-01-31 LAB — MULTIPLE MYELOMA PANEL, SERUM
Albumin SerPl Elph-Mcnc: 3.5 g/dL (ref 2.9–4.4)
Albumin/Glob SerPl: 1.4 (ref 0.7–1.7)
Alpha 1: 0.2 g/dL (ref 0.0–0.4)
Alpha2 Glob SerPl Elph-Mcnc: 0.9 g/dL (ref 0.4–1.0)
B-Globulin SerPl Elph-Mcnc: 0.9 g/dL (ref 0.7–1.3)
Gamma Glob SerPl Elph-Mcnc: 0.5 g/dL (ref 0.4–1.8)
Globulin, Total: 2.6 g/dL (ref 2.2–3.9)
IgA: 182 mg/dL (ref 61–437)
IgG (Immunoglobin G), Serum: 456 mg/dL — ABNORMAL LOW (ref 603–1613)
IgM (Immunoglobulin M), Srm: 38 mg/dL (ref 15–143)
Total Protein ELP: 6.1 g/dL (ref 6.0–8.5)

## 2022-01-31 LAB — HM DIABETES EYE EXAM

## 2022-02-03 ENCOUNTER — Ambulatory Visit (INDEPENDENT_AMBULATORY_CARE_PROVIDER_SITE_OTHER): Payer: Medicare Other | Admitting: Podiatry

## 2022-02-03 ENCOUNTER — Encounter: Payer: Self-pay | Admitting: Podiatry

## 2022-02-03 DIAGNOSIS — M79674 Pain in right toe(s): Secondary | ICD-10-CM | POA: Diagnosis not present

## 2022-02-03 DIAGNOSIS — M79675 Pain in left toe(s): Secondary | ICD-10-CM | POA: Diagnosis not present

## 2022-02-03 DIAGNOSIS — E1142 Type 2 diabetes mellitus with diabetic polyneuropathy: Secondary | ICD-10-CM

## 2022-02-03 DIAGNOSIS — Q828 Other specified congenital malformations of skin: Secondary | ICD-10-CM

## 2022-02-03 DIAGNOSIS — L84 Corns and callosities: Secondary | ICD-10-CM | POA: Diagnosis not present

## 2022-02-03 DIAGNOSIS — B351 Tinea unguium: Secondary | ICD-10-CM | POA: Diagnosis not present

## 2022-02-04 ENCOUNTER — Other Ambulatory Visit: Payer: Self-pay

## 2022-02-07 ENCOUNTER — Other Ambulatory Visit: Payer: Self-pay

## 2022-02-07 NOTE — Progress Notes (Signed)
  Subjective:  Patient ID: Robert Sparks, male    DOB: Oct 02, 1945,  MRN: [id number]  Robert Sparks presents to clinic today for at risk foot care with history of diabetic neuropathy and callus(es) 1st metatarsal head left lower extremity, porokeratotic lesion(s) right great toe, and painful mycotic nails. Painful toenails interfere with ambulation. Aggravating factors include wearing enclosed shoe gear. Pain is relieved with periodic professional debridement. Painful callus(es) and porokeratotic lesion(s) are aggravated when weightbearing with and without shoegear. Pain is relieved with periodic professional debridement.  Last A1c was 8.4%.  New problem(s): None.   PCP is [name], MD , and last visit was  January 02, 2022  Allergies  Allergen Reactions   Other Other (See Comments)    Nicoderm CQ = Bad Dreams Nicotine gum = Bad Dreams     Review of Systems: Negative except as noted in the HPI.  Objective: No changes noted in today's physical examination. [name] is a pleasant 75 y.o. male in NAD. AAO X 3.  Vascular Examination: CFT immediate b/l LE. Palpable DP/PT pulses b/l LE. Digital hair present b/l. Skin temperature gradient WNL b/l. No pain with calf compression b/l. No edema noted b/l. No cyanosis or clubbing noted b/l LE.  Dermatological Examination: Pedal skin warm and supple b/l.  No open wounds b/l. No interdigital macerations. Toenails 1-5 b/l elongated, thickened, discolored with subungual debris. +Tenderness with dorsal palpation of nailplates. Hyperkeratotic lesion(s) noted 1st metatarsal head left foot.  Porokeratotic lesion(s) noted R hallux. No erythema, no edema, no drainage, no fluctuance.  Musculoskeletal Examination: Normal muscle strength 5/5 to all lower extremity muscle groups bilaterally. HAV with bunion deformity noted b/l LE.[name] No pain, crepitus or joint limitation noted with ROM b/l LE.  Patient ambulates independently without  assistive aids.  Neurological Examination: Protective sensation diminished with 10g monofilament b/l. Vibratory sensation intact b/l.    Latest Ref Rng & Units 01/02/2022   11:34 AM 08/29/2021   10:43 AM  Hemoglobin A1C  Hemoglobin-A1c 0.0 - 7.0 % 8.4  8.0    Assessment/Plan: 1. Pain due to onychomycosis of toenails of both feet   2. Callus   3. Porokeratosis   4. Diabetic peripheral neuropathy associated with type 2 diabetes mellitus ([place])      -Examined patient. -Patient to continue soft, supportive shoe gear daily. -Mycotic toenails 1-5 bilaterally were debrided in length and girth with sterile nail nippers and dremel without incident. -Callus(es) 1st metatarsal head left lower extremity pared utilizing sterile scalpel blade without complication or incident. Total number debrided =1. -Porokeratotic lesion(s) plantar IPJ of right great toe pared and enucleated with sterile scalpel blade without incident. Total number of lesions debrided=1. -Patient/POA to call should there be question/concern in the interim.   Return in about 3 months (around 05/06/2022).  Marzetta Board, DPM

## 2022-02-10 ENCOUNTER — Encounter: Payer: Self-pay | Admitting: Pharmacist

## 2022-02-10 ENCOUNTER — Ambulatory Visit: Payer: Medicare Other | Attending: Internal Medicine | Admitting: Pharmacist

## 2022-02-10 ENCOUNTER — Other Ambulatory Visit: Payer: Self-pay

## 2022-02-10 VITALS — BP 129/70 | HR 76

## 2022-02-10 DIAGNOSIS — I10 Essential (primary) hypertension: Secondary | ICD-10-CM | POA: Diagnosis not present

## 2022-02-10 DIAGNOSIS — E1142 Type 2 diabetes mellitus with diabetic polyneuropathy: Secondary | ICD-10-CM

## 2022-02-10 MED ORDER — NOVOLOG MIX 70/30 FLEXPEN (70-30) 100 UNIT/ML ~~LOC~~ SUPN
PEN_INJECTOR | SUBCUTANEOUS | 11 refills | Status: DC
  Filled 2022-02-10: qty 15, 75d supply, fill #0

## 2022-02-10 NOTE — Progress Notes (Signed)
S:     No chief complaint on file.  Robert Sparks is a 76 y.o. male who presents for diabetes evaluation, education, and management. PMH is significant for HTN, DM 2 with polyneuropathy, HL, CKD stage 3, tob dependence, EtOH abuse, prostate CA, OA knee, vit B 12 def, spinal stenosis, and multiple myeloma in remission, memory changes MMSE 24/30 2021).   Patient was referred and last seen by Primary Care Provider, [name], on 01/02/2022.   Today, patient arrives in good spirits and presents without any assistance.   Patient reports Diabetes was diagnosed is longstanding. Of note, he is on Decadron for chemo support. This has caused some recent varying with his blood sugars. He is here today to review recent CBG trends at home. Unfortunately, he never did get the [place] device approved and he does not have his home glucometer with him today.  Family/Social History:  -Fhx: CKD -Tobacco: current 0.5 PPD smoker -Alcohol: none reported   Current diabetes medications include: Novolog 70/30 6u BID (will use 9u BID after dexamethasone for ~3 days)  Patient reports adherence to taking all medications as prescribed.   Insurance coverage: [employer]  Patient denies hypoglycemic events.  Reported home fasting blood sugars: 110s - 130s.  Reported 2 hour post-meal/random blood sugars: 150s - low 200s.  Patient denies nocturia (nighttime urination).  Patient reports neuropathy (nerve pain). Patient denies visual changes. Patient reports self foot exams.   Patient reported dietary habits:  -Compliant with salt restriction  -Denies drinking excessive caffeine  -Tries to remain adherent to a diabetic diet.   Patient-reported exercise habits: walks occasionally but no formal exercise regimen   O:   ROS  Physical Exam  7 day average blood glucose: no meter with him today.  No libre with him today.    Lab Results  Component Value Date   HGBA1C 8.4 (A) 01/02/2022    There were no vitals filed for this visit.  Lipid Panel     Component Value Date/Time   CHOL 184 04/25/2019 0939   TRIG 114 04/25/2019 0939   HDL 88 04/25/2019 0939   CHOLHDL 2.1 04/25/2019 0939   CHOLHDL 3.6 06/21/2016 1047   VLDL 50 (H) 06/21/2016 1047   LDLCALC 76 04/25/2019 0939    Clinical Atherosclerotic Cardiovascular Disease (ASCVD): No  The 10-year ASCVD risk score (Arnett DK, et al., 2019) is: 46.3%   Values used to calculate the score:     Age: 15 years     Sex: Male     Is Non-Hispanic African American: Yes     Diabetic: Yes     Tobacco smoker: Yes     Systolic Blood Pressure: 841 mmHg     Is BP treated: Yes     HDL Cholesterol: 88 mg/dL     Total Cholesterol: 184 mg/dL   A/P: Diabetes longstanding currently uncontrolled based on A1c and home glucose trends. Patient is able to verbalize appropriate hypoglycemia management plan. Medication adherence appears appropriate.  -Increased dose of Novolog 70/30 insulin to 10u BID.  -Patient educated on purpose, proper use, and potential adverse effects of Novolog.  -Extensively discussed pathophysiology of diabetes, recommended lifestyle interventions, dietary effects on blood sugar control.  -Counseled on s/sx of and management of hypoglycemia.  -Next A1c anticipated 03/2022.   Hypertension longstanding currently at goal. Blood pressure goal of <130/80 mmHg. Medication adherence reported. -Continued current regimen.  Written patient instructions provided. Patient verbalized understanding of treatment plan. Total time  in face to face counseling 30 minutes.    Follow-up:  Pharmacist in 1 month.  Benard Halsted, PharmD, [facility], [facility] [phone]

## 2022-02-16 ENCOUNTER — Other Ambulatory Visit: Payer: Self-pay

## 2022-02-20 ENCOUNTER — Other Ambulatory Visit: Payer: Self-pay

## 2022-02-22 ENCOUNTER — Other Ambulatory Visit: Payer: Self-pay

## 2022-02-23 ENCOUNTER — Other Ambulatory Visit: Payer: Self-pay | Admitting: *Deleted

## 2022-02-23 ENCOUNTER — Inpatient Hospital Stay: Payer: Medicare Other

## 2022-02-23 ENCOUNTER — Other Ambulatory Visit: Payer: Medicare HMO

## 2022-02-23 ENCOUNTER — Ambulatory Visit: Payer: Medicare HMO

## 2022-02-23 ENCOUNTER — Other Ambulatory Visit: Payer: Self-pay

## 2022-02-23 DIAGNOSIS — C9002 Multiple myeloma in relapse: Secondary | ICD-10-CM

## 2022-02-24 ENCOUNTER — Other Ambulatory Visit: Payer: Self-pay

## 2022-02-24 ENCOUNTER — Inpatient Hospital Stay: Payer: Medicare Other

## 2022-02-24 ENCOUNTER — Inpatient Hospital Stay: Payer: Medicare Other | Attending: Hematology

## 2022-02-24 ENCOUNTER — Inpatient Hospital Stay (HOSPITAL_BASED_OUTPATIENT_CLINIC_OR_DEPARTMENT_OTHER): Payer: Medicare Other | Admitting: Hematology

## 2022-02-24 ENCOUNTER — Other Ambulatory Visit: Payer: Self-pay | Admitting: Hematology

## 2022-02-24 VITALS — BP 145/73 | HR 78 | Temp 98.1°F | Resp 20

## 2022-02-24 VITALS — BP 131/71 | HR 71 | Temp 97.2°F | Resp 18 | Wt 158.5 lb

## 2022-02-24 DIAGNOSIS — I129 Hypertensive chronic kidney disease with stage 1 through stage 4 chronic kidney disease, or unspecified chronic kidney disease: Secondary | ICD-10-CM | POA: Diagnosis not present

## 2022-02-24 DIAGNOSIS — N189 Chronic kidney disease, unspecified: Secondary | ICD-10-CM | POA: Insufficient documentation

## 2022-02-24 DIAGNOSIS — D631 Anemia in chronic kidney disease: Secondary | ICD-10-CM | POA: Insufficient documentation

## 2022-02-24 DIAGNOSIS — C9002 Multiple myeloma in relapse: Secondary | ICD-10-CM

## 2022-02-24 DIAGNOSIS — Z5112 Encounter for antineoplastic immunotherapy: Secondary | ICD-10-CM | POA: Diagnosis not present

## 2022-02-24 DIAGNOSIS — E1122 Type 2 diabetes mellitus with diabetic chronic kidney disease: Secondary | ICD-10-CM | POA: Diagnosis not present

## 2022-02-24 DIAGNOSIS — Z7189 Other specified counseling: Secondary | ICD-10-CM

## 2022-02-24 DIAGNOSIS — Z5111 Encounter for antineoplastic chemotherapy: Secondary | ICD-10-CM

## 2022-02-24 LAB — CMP (CANCER CENTER ONLY)
ALT: 30 U/L (ref 0–44)
AST: 26 U/L (ref 15–41)
Albumin: 4.5 g/dL (ref 3.5–5.0)
Alkaline Phosphatase: 66 U/L (ref 38–126)
Anion gap: 5 (ref 5–15)
BUN: 18 mg/dL (ref 8–23)
CO2: 23 mmol/L (ref 22–32)
Calcium: 9.4 mg/dL (ref 8.9–10.3)
Chloride: 112 mmol/L — ABNORMAL HIGH (ref 98–111)
Creatinine: 1.1 mg/dL (ref 0.61–1.24)
GFR, Estimated: >60 mL/min (ref >60)
Glucose, Bld: 143 mg/dL — ABNORMAL HIGH (ref 70–99)
Potassium: 3.5 mmol/L (ref 3.5–5.1)
Sodium: 140 mmol/L (ref 135–145)
Total Bilirubin: 0.6 mg/dL (ref 0.3–1.2)
Total Protein: 6.9 g/dL (ref 6.5–8.1)

## 2022-02-24 LAB — CBC WITH DIFFERENTIAL (CANCER CENTER ONLY)
Abs Immature Granulocytes: 0.01 10*3/uL (ref 0.00–0.07)
Basophils Absolute: 0 10*3/uL (ref 0.0–0.1)
Basophils Relative: 1 %
Eosinophils Absolute: 0.3 10*3/uL (ref 0.0–0.5)
Eosinophils Relative: 7 %
HCT: 34.4 % — ABNORMAL LOW (ref 39.0–52.0)
Hemoglobin: 12 g/dL — ABNORMAL LOW (ref 13.0–17.0)
Immature Granulocytes: 0 %
Lymphocytes Relative: 27 %
Lymphs Abs: 1 10*3/uL (ref 0.7–4.0)
MCH: 34.3 pg — ABNORMAL HIGH (ref 26.0–34.0)
MCHC: 34.9 g/dL (ref 30.0–36.0)
MCV: 98.3 fL (ref 80.0–100.0)
Monocytes Absolute: 0.4 10*3/uL (ref 0.1–1.0)
Monocytes Relative: 11 %
Neutro Abs: 2 10*3/uL (ref 1.7–7.7)
Neutrophils Relative %: 54 %
Platelet Count: 204 10*3/uL (ref 150–400)
RBC: 3.5 MIL/uL — ABNORMAL LOW (ref 4.22–5.81)
RDW: 12.8 % (ref 11.5–15.5)
WBC Count: 3.7 10*3/uL — ABNORMAL LOW (ref 4.0–10.5)
nRBC: 0 % (ref 0.0–0.2)

## 2022-02-24 MED ORDER — SODIUM CHLORIDE 0.9 % IV SOLN
20.0000 mg | Freq: Once | INTRAVENOUS | Status: AC
  Administered 2022-02-24: 20 mg via INTRAVENOUS
  Filled 2022-02-24: qty 20

## 2022-02-24 MED ORDER — FAMOTIDINE IN NACL 20-0.9 MG/50ML-% IV SOLN
20.0000 mg | Freq: Once | INTRAVENOUS | Status: AC
  Administered 2022-02-24: 20 mg via INTRAVENOUS
  Filled 2022-02-24: qty 50

## 2022-02-24 MED ORDER — ACETAMINOPHEN 325 MG PO TABS
650.0000 mg | ORAL_TABLET | Freq: Once | ORAL | Status: AC
  Administered 2022-02-24: 650 mg via ORAL
  Filled 2022-02-24: qty 2

## 2022-02-24 MED ORDER — POLYETHYLENE GLYCOL 3350 17 G PO PACK
17.0000 g | PACK | Freq: Every day | ORAL | 2 refills | Status: DC
  Filled 2022-02-24: qty 30, 30d supply, fill #0

## 2022-02-24 MED ORDER — SODIUM CHLORIDE 0.9 % IV SOLN
16.0000 mg/kg | Freq: Once | INTRAVENOUS | Status: AC
  Administered 2022-02-24: 1200 mg via INTRAVENOUS
  Filled 2022-02-24: qty 60

## 2022-02-24 MED ORDER — DIPHENHYDRAMINE HCL 25 MG PO CAPS
50.0000 mg | ORAL_CAPSULE | Freq: Once | ORAL | Status: AC
  Administered 2022-02-24: 50 mg via ORAL
  Filled 2022-02-24: qty 2

## 2022-02-24 MED ORDER — POMALIDOMIDE 2 MG PO CAPS: 2.0000 mg | ORAL_CAPSULE | Freq: Every day | ORAL | 1 refills | Status: DC

## 2022-02-24 MED ORDER — SODIUM CHLORIDE 0.9 % IV SOLN: Freq: Once | INTRAVENOUS | Status: AC

## 2022-02-24 NOTE — Patient Instructions (Signed)
[facility]  Discharge Instructions: Thank you for choosing [facility] to provide your oncology and hematology care.   If you have a lab appointment with the [facility], please go directly to the [facility] and check in at the registration area.   Wear comfortable clothing and clothing appropriate for easy access to any Portacath or PICC line.   We strive to give you quality time with your provider. You may need to reschedule your appointment if you arrive late (15 or more minutes).  Arriving late affects you and other patients whose appointments are after yours.  Also, if you miss three or more appointments without notifying the office, you may be dismissed from the clinic at the provider's discretion.      For prescription refill requests, have your pharmacy contact our office and allow 72 hours for refills to be completed.    Today you received the following chemotherapy and/or immunotherapy agent: Daratumumab Soundra [facility])   To help prevent nausea and vomiting after your treatment, we encourage you to take your nausea medication as directed.  BELOW ARE SYMPTOMS THAT SHOULD BE REPORTED IMMEDIATELY: *FEVER GREATER THAN 100.4 F (38 C) OR HIGHER *CHILLS OR SWEATING *NAUSEA AND VOMITING THAT IS NOT CONTROLLED WITH YOUR NAUSEA MEDICATION *UNUSUAL SHORTNESS OF BREATH *UNUSUAL BRUISING OR BLEEDING *URINARY PROBLEMS (pain or burning when urinating, or frequent urination) *BOWEL PROBLEMS (unusual diarrhea, constipation, pain near the anus) TENDERNESS IN MOUTH AND THROAT WITH OR WITHOUT PRESENCE OF ULCERS (sore throat, sores in mouth, or a toothache) UNUSUAL RASH, SWELLING OR PAIN  UNUSUAL VAGINAL DISCHARGE OR ITCHING   Items with * indicate a potential emergency and should be followed up as soon as possible or go to the Emergency Department if any problems should occur.  Please show the CHEMOTHERAPY ALERT CARD or IMMUNOTHERAPY ALERT CARD at  check-in to the Emergency Department and triage nurse.  Should you have questions after your visit or need to cancel or reschedule your appointment, please contact [facility]  Dept: [phone]  and follow the prompts.  Office hours are 8:00 a.m. to 4:30 p.m. Monday - Friday. Please note that voicemails left after 4:00 p.m. may not be returned until the following business day.  We are closed weekends and major holidays. You have access to a nurse at all times for urgent questions. Please call the main number to the clinic Dept: [phone] and follow the prompts.   For any non-urgent questions, you may also contact your provider using MyChart. We now offer e-Visits for anyone 58 and older to request care online for non-urgent symptoms. For details visit [email or web address].   Also download the MyChart app! Go to the app store, search "MyChart", open the app, select [facility], and log in with your MyChart username and password.  Masks are optional in the cancer centers. If you would like for your care team to wear a mask while they are taking care of you, please let them know. You may have one support person who is at least 74 years old accompany you for your appointments. Daratumumab Injection What is this medication? DARATUMUMAB (dar a toom ue mab) treats multiple myeloma, a type of bone marrow cancer. It works by helping your immune system slow or stop the spread of cancer cells. It is a monoclonal antibody. This medicine may be used for other purposes; ask your health care provider or pharmacist if you have questions. COMMON BRAND NAME(S): DARZALEX  What should I tell my care team before I take this medication? They need to know if you have any of these conditions: Hereditary fructose intolerance Infection, such as chickenpox, herpes, hepatitis B virus Lung or breathing disease, such as asthma, COPD An unusual or allergic reaction to daratumumab, sorbitol,  other medications, foods, dyes, or preservatives Pregnant or trying to get pregnant Breast-feeding How should I use this medication? This medication is injected into a vein. It is given by your care team in a hospital or clinic setting. Talk to your care team about the use of this medication in children. Special care may be needed. Overdosage: If you think you have taken too much of this medicine contact a poison control center or emergency room at once. NOTE: This medicine is only for you. Do not share this medicine with others. What if I miss a dose? Keep appointments for follow-up doses. It is important not to miss your dose. Call your care team if you are unable to keep an appointment. What may interact with this medication? Interactions have not been studied. This list may not describe all possible interactions. Give your health care provider a list of all the medicines, herbs, non-prescription drugs, or dietary supplements you use. Also tell them if you smoke, drink alcohol, or use illegal drugs. Some items may interact with your medicine. What should I watch for while using this medication? Your condition will be monitored carefully while you are receiving this medication. This medication can cause serious allergic reactions. To reduce your risk, your care team may give you other medication to take before receiving this one. Be sure to follow the directions from your care team. This medication can affect the results of blood tests to match your blood type. These changes can last for up to 6 months after the final dose. Your care team will do blood tests to match your blood type before you start treatment. Tell all of your care team that you are being treated with this medication before receiving a blood transfusion. This medication can affect the results of some tests used to determine treatment response; extra tests may be needed to evaluate response. Talk to your care team if you wish to  become pregnant or think you are pregnant. This medication can cause serious birth defects if taken during pregnancy and for 3 months after the last dose. A reliable form of contraception is recommended while taking this medication and for 3 months after the last dose. Talk to your care team about effective forms of contraception. Do not breast-feed while taking this medication. What side effects may I notice from receiving this medication? Side effects that you should report to your care team as soon as possible: Allergic reactions--skin rash, itching, hives, swelling of the face, lips, tongue, or throat Infection--fever, chills, cough, sore throat, wounds that don't heal, pain or trouble when passing urine, general feeling of discomfort or being unwell Infusion reactions--chest pain, shortness of breath or trouble breathing, feeling faint or lightheaded Unusual bruising or bleeding Side effects that usually do not require medical attention (report to your care team if they continue or are bothersome): Constipation Diarrhea Fatigue Nausea Pain, tingling, or numbness in the hands or feet Swelling of the ankles, hands, or feet This list may not describe all possible side effects. Call your doctor for medical advice about side effects. You may report side effects to FDA at 1-800-FDA-1088. Where should I keep my medication? This medication is given in a hospital  or clinic. It will not be stored at home. NOTE: This sheet is a summary. It may not cover all possible information. If you have questions about this medicine, talk to your doctor, pharmacist, or health care provider.  2023 Elsevier/Gold Standard (2014-05-25 00:00:00)

## 2022-02-27 LAB — KAPPA/LAMBDA LIGHT CHAINS
Kappa free light chain: 28.1 mg/L — ABNORMAL HIGH (ref 3.3–19.4)
Kappa, lambda light chain ratio: 0.53 (ref 0.26–1.65)
Lambda free light chains: 53 mg/L — ABNORMAL HIGH (ref 5.7–26.3)

## 2022-03-01 ENCOUNTER — Telehealth: Payer: Self-pay | Admitting: Internal Medicine

## 2022-03-01 ENCOUNTER — Telehealth: Payer: Self-pay | Admitting: Hematology

## 2022-03-01 ENCOUNTER — Other Ambulatory Visit: Payer: Self-pay

## 2022-03-01 MED ORDER — INSULIN LISPRO PROT & LISPRO (75-25 MIX) 100 UNIT/ML KWIKPEN
PEN_INJECTOR | SUBCUTANEOUS | 11 refills | Status: DC
  Filled 2022-03-01: qty 15, 107d supply, fill #0

## 2022-03-01 NOTE — Telephone Encounter (Signed)
Phone call placed to patient this afternoon.  Patient informed that his insurance will no longer cover for NovoLog 70/30 mix insulin.  Patient states he did receive a letter informing him of that as well.  Advised that we change to Humalog 75/25 mix.  He is currently taking 10 units twice a day of NovoLog 70/30.  Advised that once he starts the new insulin which will be Humalog 75/25 mix, he should take only 7 units twice a day.  Patient expressed understanding.  Updated prescription sent to our pharmacy.

## 2022-03-01 NOTE — Telephone Encounter (Signed)
Scheduled follow-up appointments per 8/18 los. Patient is aware. Mailed calendar.

## 2022-03-01 NOTE — Telephone Encounter (Signed)
Oral Chemotherapy Pharmacist Encounter  I spoke with patient for overview of: Pomalyst (pomalidomide) for the treatment of R/R multiple myeloma in conjunction with daratumumab and dexamethasone, planned duration until disease progression or unacceptable toxicity.   Counseled patient on administration, dosing, side effects, monitoring, drug-food interactions, safe handling, storage, and disposal.  Patient will take Pomalyst 30m capsules, 1 capsule by mouth once daily, without regard to food, with a full glass of water.  Pomalyst will be given 21 days on, 7 days off, repeat every 28 days.  Pomalyst start date: 03/02/22  Adverse effects of Pomalyst include but are not limited to: nausea, constipation, diarrhea, abdominal pain, rash, fatigue, drug fever, peripheral edema, and decreased blood counts.   Patient continues to take acyclovir and receives dexamethasone in clinic prescriptions. Patient counseled on importance of continuing daily aspirin 868mfor VTE prophylaxis.  Reviewed with patient importance of keeping a medication schedule and plan for any missed doses. No barriers to medication adherence identified.  Medication reconciliation performed and medication/allergy list updated.   Insurance authorization for [employer] been obtained. Pomalyst prescription is being dispensed from [facility] it is a limited distribution medication. This was delivered to patient's home on 8/23/2 AM.  All questions answered.  Robert Sparks voiced understanding and appreciation.   Medication education handout placed in mail for patient. Patient knows to call the office with questions or concerns. Oral Chemotherapy Clinic phone number provided to patient.   Robert CroakPharmD, BCPS, BCOP Hematology/Oncology Clinical Pharmacist [phone]/23/2023 11:50 AM

## 2022-03-02 ENCOUNTER — Encounter: Payer: Self-pay | Admitting: Hematology

## 2022-03-02 ENCOUNTER — Other Ambulatory Visit: Payer: Self-pay

## 2022-03-02 LAB — MULTIPLE MYELOMA PANEL, SERUM
Albumin SerPl Elph-Mcnc: 4.1 g/dL (ref 2.9–4.4)
Albumin/Glob SerPl: 1.8 — ABNORMAL HIGH (ref 0.7–1.7)
Alpha 1: 0.2 g/dL (ref 0.0–0.4)
Alpha2 Glob SerPl Elph-Mcnc: 0.8 g/dL (ref 0.4–1.0)
B-Globulin SerPl Elph-Mcnc: 1 g/dL (ref 0.7–1.3)
Gamma Glob SerPl Elph-Mcnc: 0.4 g/dL (ref 0.4–1.8)
Globulin, Total: 2.4 g/dL (ref 2.2–3.9)
IgA: 179 mg/dL (ref 61–437)
IgG (Immunoglobin G), Serum: 484 mg/dL — ABNORMAL LOW (ref 603–1613)
IgM (Immunoglobulin M), Srm: 38 mg/dL (ref 15–143)
M Protein SerPl Elph-Mcnc: 0.4 g/dL — ABNORMAL HIGH
Total Protein ELP: 6.5 g/dL (ref 6.0–8.5)

## 2022-03-02 NOTE — Progress Notes (Signed)
HEMATOLOGY/ONCOLOGY CLINIC NOTE  Date of Service: 02/24/2022   Patient Care Team: [name], MD as PCP - General (Internal Medicine)  CHIEF COMPLAINTS/PURPOSE OF CONSULTATION:  Follow-up for continued evaluation and management of relapsed multiple myeloma  Oncologic History:  76 y.o. male with diagnosis of IgA lambda active multiple myeloma, ISS Stage I. Active disease diagnosed based on presence of anemia, kidney insufficiency, and paraproteinemia with significant predominance of lambda light chains as well as significant elevation of IgA. Bone marrow biopsy confirmed presence of atypical monoclonal plasma cell process in the bone marrow comprising at least 7% of the cellularity. Based on the findings, patient was started on treatment with lenalidomide, bortezomib, and low-dose dexamethasone based on the anticipated tolerance by the patient. Lenalidomide was started at 10 mg daily based on creatinine clearance of 42, dexamethasone dose was reduced to 20 mg weekly based on patient's age. Treatment was complicated by rapid development of cutaneous rash attributed to lenalidomide, inaddition to decreased renal function and now patient has persistent severe anemia. Side-effects were attributed to Lenalidomide and lenalidomide was discontinued with subsequent recovery.  Patient has been receiving bortezomib weekly with low-dose dexamethasone in 4-day cycles.   Patient has completed induction systemic therapy for his disease with repeat bone marrow biopsy confirming complete response including no evidence of minimal residual disease by cytogenetics or FISH.  HISTORY OF PRESENTING ILLNESS:  Please see previous note for details of initial presentation.  INTERVAL HISTORY:   Mr Maisen Klingler Coltrin is a 76 y.o. male is here for continued evaluation and management of his relapsed multiple myeloma. He notes no acute new symptoms.  He is awaiting delivery of his pomalidomide so he can start  this. No new focal bone pains.  No fevers no chills no night sweats.  No infection issues. Unchanged lower extremity neuropathy. No acute new toxicities from his daratumumab. Done today were discussed in detail with him.  MEDICAL HISTORY:  Past Medical History:  Diagnosis Date   Anemia    Arthritis    shoulders,feet    Cataract    per eye dr -has appt 5-11   CKD (chronic kidney disease), stage III (HCC)    Elevated PSA    History of ketoacidosis    03-29-2015    History of sepsis    07-25-2013  non-compliant w/ medication   Hyperlipidemia    Hypertension    Nocturia    Peripheral neuropathy    Prostate cancer ([place])    Type 2 diabetes mellitus with insulin therapy [facility])     SURGICAL HISTORY: Past Surgical History:  Procedure Laterality Date   CIRCUMCISION  1990's   PROSTATE BIOPSY N/A 12/21/2015   Procedure: BIOPSY TRANSRECTAL ULTRASONIC PROSTATE (TUBP);  Surgeon: Festus Aloe, MD;  Location: [facility];  Service: Urology;  Laterality: N/A;    SOCIAL HISTORY: Social History   Socioeconomic History   Marital status: Single    Spouse name: Not on file   Number of children: Not on file   Years of education: Not on file   Highest education level: Not on file  Occupational History   Not on file  Tobacco Use   Smoking status: Every Day    Packs/day: 0.50    Years: 50.00    Total pack years: 25.00    Types: Cigarettes   Smokeless tobacco: Never   Tobacco comments:    1 ppwk-4-5 a day   Vaping Use   Vaping Use: Never used  Substance  and Sexual Activity   Alcohol use: Yes    Alcohol/week: 1.0 standard drink of alcohol    Types: 1 Cans of beer per week    Comment: 1 every day-quit couple weeks ago   Drug use: No   Sexual activity: Not Currently  Other Topics Concern   Not on file  Social History Narrative   Not on file   Social Determinants of Health   Financial Resource Strain: Not on file  Food Insecurity: Not on file  Transportation  Needs: Not on file  Physical Activity: Not on file  Stress: Not on file  Social Connections: Not on file  Intimate Partner Violence: Not on file    FAMILY HISTORY: Family History  Problem Relation Age of Onset   Kidney disease Mother    Cancer Neg Hx    Colon cancer Neg Hx    Colon polyps Neg Hx    Esophageal cancer Neg Hx    Rectal cancer Neg Hx    Stomach cancer Neg Hx     ALLERGIES:  is allergic to other.  MEDICATIONS:  Current Outpatient Medications  Medication Sig Dispense Refill   Accu-Chek Softclix Lancets lancets Use to test blood sugar 3 times daily. 100 each 3   acyclovir (ZOVIRAX) 400 MG tablet Take 1 tablet (400 mg total) by mouth 2 (two) times daily. 60 tablet 11   amLODipine (NORVASC) 10 MG tablet TAKE 1 TABLET (10 MG TOTAL) BY MOUTH DAILY. 90 tablet 3   aspirin EC 81 MG tablet Take 81 mg by mouth daily. Swallow whole.     b complex vitamins capsule Take 1 capsule by mouth daily. 30 capsule 5   Blood Glucose Monitoring Suppl (ACCU-CHEK GUIDE) w/Device KIT Use to test blood sugar three times daily 1 kit 0   Continuous Blood Gluc Receiver (FREESTYLE LIBRE READER) DEVI Use to check blood sugar 3x daily. E11.42 1 each 0   Continuous Blood Gluc Sensor (FREESTYLE LIBRE SENSOR SYSTEM) MISC Use to check blood sugar 3 times daily. Change sensor every 2 weeks. 2 each 12   dexamethasone (DECADRON) 4 MG tablet 33m (5 tabs) Take the day after each dose of daratumumab. Take with breakfast 20 tablet 11   diclofenac Sodium (VOLTAREN) 1 % GEL Apply 4 g topically 4 (four) times daily. 150 g 5   donepezil (ARICEPT) 5 MG tablet TAKE 1 TABLET (5 MG TOTAL) BY MOUTH AT BEDTIME. FOR MEMORY ISSUES 30 tablet 5   ferrous sulfate 325 (65 FE) MG tablet Take 325 mg by mouth daily.     gabapentin (NEURONTIN) 300 MG capsule TAKE 1 CAPSULE BY MOUTH IN THE MORNING AND LUNCH, AND 2 AT BEDTIME 120 capsule 6   glucose blood (ACCU-CHEK GUIDE) test strip Use to test blood sugar 3 times daily. 100 each 2    hydrOXYzine (ATARAX/VISTARIL) 25 MG tablet TAKE 1 TABLET (25 MG TOTAL) BY MOUTH 3 (THREE) TIMES DAILY AS NEEDED FOR ITCHING (RASH). 30 tablet 0   Insulin Lispro Prot & Lispro (HUMALOG MIX 75/25 KWIKPEN) (75-25) 100 UNIT/ML Kwikpen inject 7 units with breakfast and dinner. 15 mL 11   Insulin Pen Needle (BD PEN NEEDLE NANO U/F) 32G X 4 MM MISC USE AS DIRECTED TWICE DAILY WITH INSULIN 100 each 6   lisinopril (ZESTRIL) 2.5 MG tablet Take 1 tablet (2.5 mg total) by mouth daily. 30 tablet 4   Miconazole Nitrate (ATHLETES FOOT POWDER SPRAY) 2 % AERP Spray between toes and under toes once daily. 130 g  4   Multiple Vitamin (MULTIVITAMIN WITH MINERALS) TABS tablet Take 1 tablet by mouth daily. 60 tablet 2   ondansetron (ZOFRAN) 8 MG tablet Take 1 tablet (8 mg total) by mouth 2 (two) times daily as needed (Nausea or vomiting). 30 tablet 1   polyethylene glycol (MIRALAX) 17 g packet Take 17 g by mouth daily. 30 each 2   pomalidomide (POMALYST) 2 MG capsule Take 1 capsule (2 mg total) by mouth daily. 3 weeks on 1 week off 21 capsule 1   pravastatin (PRAVACHOL) 20 MG tablet TAKE 1 TABLET (20 MG TOTAL) BY MOUTH DAILY. 90 tablet 2   prochlorperazine (COMPAZINE) 10 MG tablet Take 1 tablet (10 mg total) by mouth every 6 (six) hours as needed (Nausea or vomiting). 30 tablet 1   tadalafil (CIALIS) 20 MG tablet Take 20 mg by mouth as needed.     tiZANidine (ZANAFLEX) 2 MG tablet Take 1 tablet (2 mg total) by mouth at bedtime as needed for muscle spasms. 30 tablet 1   vitamin B-12 (CYANOCOBALAMIN) 100 MCG tablet Take 100 mcg by mouth daily.     No current facility-administered medications for this visit.    ROS 10 Point review of Systems was done is negative except as noted above.   PHYSICAL EXAMINATION: .BP 131/71   Pulse 71   Temp (!) 97.2 F (36.2 C)   Resp 18   Wt 158 lb 8 oz (71.9 kg)   SpO2 100%   BMI 24.82 kg/m  NAD GENERAL:alert, in no acute distress and comfortable SKIN: no acute rashes, no  significant lesions EYES: conjunctiva are pink and non-injected, sclera anicteric OROPHARYNX: MMM, no exudates, no oropharyngeal erythema or ulceration NECK: supple, no JVD LYMPH:  no palpable lymphadenopathy in the cervical, axillary or inguinal regions LUNGS: clear to auscultation b/l with normal respiratory effort HEART: regular rate & rhythm ABDOMEN:  normoactive bowel sounds , non tender, not distended. Extremity: no pedal edema PSYCH: alert & oriented x 3 with fluent speech NEURO: no focal motor/sensory deficits  LABORATORY DATA:  I have reviewed the data as listed  .    Latest Ref Rng & Units 02/24/2022    8:45 AM 01/26/2022    8:56 AM 12/30/2021    8:53 AM  CBC  WBC 4.0 - 10.5 K/uL 3.7  4.9  5.6   Hemoglobin 13.0 - 17.0 g/dL 12.0  11.8  12.8   Hematocrit 39.0 - 52.0 % 34.4  33.9  35.7   Platelets 150 - 400 K/uL 204  211  185       Latest Ref Rng & Units 02/24/2022    8:45 AM 01/26/2022    8:56 AM 12/30/2021    8:53 AM  CMP  Glucose 70 - 99 mg/dL 143  171  190   BUN 8 - 23 mg/dL '18  17  13   ' Creatinine 0.61 - 1.24 mg/dL 1.10  1.17  1.30   Sodium 135 - 145 mmol/L 140  135  138   Potassium 3.5 - 5.1 mmol/L 3.5  3.8  3.8   Chloride 98 - 111 mmol/L 112  107  107   CO2 22 - 32 mmol/L '23  21  24   ' Calcium 8.9 - 10.3 mg/dL 9.4  9.0  9.6   Total Protein 6.5 - 8.1 g/dL 6.9  6.6  6.7   Total Bilirubin 0.3 - 1.2 mg/dL 0.6  0.6  0.8   Alkaline Phos 38 - 126 U/L 66  60  58   AST 15 - 41 U/L '26  22  20   ' ALT 0 - 44 U/L '30  23  24      ' 08/29/17 BM Bx:    08/29/17 Cytogenetics:   03/13/17 Cytogenetics:        PATHOLOGY Surgical Pathology  CASE: [id number]  PATIENT: Termaine Schnitker  Bone Marrow Report      Clinical History: Multiple myeloma in relapse ([place]) ,([facility])      DIAGNOSIS:   BONE MARROW, ASPIRATE, CLOT, CORE:  -Hypercellular bone marrow for age with plasma cell neoplasm  -See comment   PERIPHERAL BLOOD:  -Mild normocytic-normochromic anemia   -Eosinophilia   COMMENT:   The bone marrow is hypercellular for age with increased number of plasma  cells representing 8% of all cells in the aspirate associated with  numerous small clusters in the clot and biopsy sections. The plasma  cells display lambda light chain restriction consistent with plasma cell  neoplasm.  The background shows trilineage hematopoiesis with generally  nonspecific myeloid changes, likely secondary in nature in this setting.  Correlation with cytogenetic and FISH studies is recommended.      RADIOGRAPHIC STUDIES: I have personally reviewed the radiological images as listed and agreed with the findings in the report. No results found.  ASSESSMENT & PLAN:   76 y.o. male with   1.  Relapsed IgA Lambda Multiple Myeloma ISS Stage I    Active disease was previously diagnosed based on presence of anemia, kidney insufficiency, and paraproteinemia with significant predominance of lambda light chains as well as significant elevation of IgA. Patient is status post patient was treated with induction bortezomib Revlimid dexamethasone.  Had issues with skin rashes with Revlimid and this was discontinued.  Completed bortezomib dexamethasone induction and achieved complete remission. Was on maintenance Velcade 2 weeks which was then switched to maintenance Ninlaro. Patient had issues with worsening neuropathy which was a combination of Velcade and his diabetes.  Ninlaro was discontinued as well after maintenance of more than 2 years.  2.  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 Bone marrow biopsy shows 8% lambda restricted plasma cells PET CT scan with no hypermetabolic osseous lesions  3.  Hypertension 4.  Diabetes type 2 5 .chronic kidney disease due to hypertension and diabetes. 6.  Peripheral neuropathy related to diabetes and previous myeloma treatments.  Plan -Labs  done today show stable CBC CMP . Myeloma panel from today is currently pending. Previous myeloma panel had shown a bump in his M protein to 0.5 g/dL and then also had an unquantifiable M protein due to coagulation with the beta fraction. -Due to concerns with progression he is going to start pomalidomide 2 mg daily 3 weeks on 1 week off. -No new focal symptoms related to multiple myeloma. -No new toxicities related to his daratumumab. -We will continue daratumumab with same supportive medications. -He was again reminded to start aspirin 81 mg p.o. daily for VTE prophylaxis -Continue acyclovir for VZV prophylaxis. -Given his neuropathy he was recommended to use a walking stick or cane for additional balance support to reduce the risk of falls.  FOLLOW UP: We will need to schedule next 6 cycles of daratumumab every 2 weeks with port flush and labs MD visit in 6 weeks   The total time spent in the appointment was 25 minutes*.  All of the patient's questions were answered with apparent satisfaction.  The patient knows to call the clinic with any problems, questions or concerns.   Sullivan Lone MD MS AAHIVMS [facility] Hematology/Oncology Physician [facility]  .*Total Encounter Time as defined by the Centers for Medicare and Medicaid Services includes, in addition to the face-to-face time of a patient visit (documented in the note above) non-face-to-face time: obtaining and reviewing outside history, ordering and reviewing medications, tests or procedures, care coordination (communications with other health care professionals or caregivers) and documentation in the medical record.

## 2022-03-08 ENCOUNTER — Other Ambulatory Visit: Payer: Self-pay

## 2022-03-08 DIAGNOSIS — C9002 Multiple myeloma in relapse: Secondary | ICD-10-CM

## 2022-03-09 ENCOUNTER — Other Ambulatory Visit: Payer: Self-pay

## 2022-03-09 ENCOUNTER — Other Ambulatory Visit: Payer: Self-pay | Admitting: Internal Medicine

## 2022-03-09 MED FILL — Dexamethasone Sodium Phosphate Inj 100 MG/10ML: INTRAMUSCULAR | Qty: 2 | Status: AC

## 2022-03-10 ENCOUNTER — Inpatient Hospital Stay: Payer: Medicare Other

## 2022-03-10 ENCOUNTER — Inpatient Hospital Stay: Payer: Medicare Other | Attending: Hematology

## 2022-03-10 ENCOUNTER — Other Ambulatory Visit: Payer: Self-pay

## 2022-03-10 ENCOUNTER — Other Ambulatory Visit: Payer: Self-pay | Admitting: Hematology

## 2022-03-10 VITALS — BP 141/74 | HR 79 | Temp 97.8°F | Resp 18 | Wt 160.8 lb

## 2022-03-10 DIAGNOSIS — C9002 Multiple myeloma in relapse: Secondary | ICD-10-CM | POA: Insufficient documentation

## 2022-03-10 DIAGNOSIS — Z5112 Encounter for antineoplastic immunotherapy: Secondary | ICD-10-CM | POA: Diagnosis not present

## 2022-03-10 DIAGNOSIS — Z7189 Other specified counseling: Secondary | ICD-10-CM

## 2022-03-10 LAB — CBC WITH DIFFERENTIAL (CANCER CENTER ONLY)
Abs Immature Granulocytes: 0.01 10*3/uL (ref 0.00–0.07)
Basophils Absolute: 0 10*3/uL (ref 0.0–0.1)
Basophils Relative: 1 %
Eosinophils Absolute: 0.6 10*3/uL — ABNORMAL HIGH (ref 0.0–0.5)
Eosinophils Relative: 13 %
HCT: 32.2 % — ABNORMAL LOW (ref 39.0–52.0)
Hemoglobin: 11.2 g/dL — ABNORMAL LOW (ref 13.0–17.0)
Immature Granulocytes: 0 %
Lymphocytes Relative: 18 %
Lymphs Abs: 0.8 10*3/uL (ref 0.7–4.0)
MCH: 33.9 pg (ref 26.0–34.0)
MCHC: 34.8 g/dL (ref 30.0–36.0)
MCV: 97.6 fL (ref 80.0–100.0)
Monocytes Absolute: 0.3 10*3/uL (ref 0.1–1.0)
Monocytes Relative: 7 %
Neutro Abs: 2.7 10*3/uL (ref 1.7–7.7)
Neutrophils Relative %: 61 %
Platelet Count: 220 10*3/uL (ref 150–400)
RBC: 3.3 MIL/uL — ABNORMAL LOW (ref 4.22–5.81)
RDW: 13.2 % (ref 11.5–15.5)
WBC Count: 4.4 10*3/uL (ref 4.0–10.5)
nRBC: 0 % (ref 0.0–0.2)

## 2022-03-10 LAB — CMP (CANCER CENTER ONLY)
ALT: 27 U/L (ref 0–44)
AST: 19 U/L (ref 15–41)
Albumin: 3.9 g/dL (ref 3.5–5.0)
Alkaline Phosphatase: 67 U/L (ref 38–126)
Anion gap: 5 (ref 5–15)
BUN: 15 mg/dL (ref 8–23)
CO2: 23 mmol/L (ref 22–32)
Calcium: 9.3 mg/dL (ref 8.9–10.3)
Chloride: 108 mmol/L (ref 98–111)
Creatinine: 1.23 mg/dL (ref 0.61–1.24)
GFR, Estimated: >60 mL/min (ref >60)
Glucose, Bld: 220 mg/dL — ABNORMAL HIGH (ref 70–99)
Potassium: 3.6 mmol/L (ref 3.5–5.1)
Sodium: 136 mmol/L (ref 135–145)
Total Bilirubin: 0.5 mg/dL (ref 0.3–1.2)
Total Protein: 6.3 g/dL — ABNORMAL LOW (ref 6.5–8.1)

## 2022-03-10 MED ORDER — ACETAMINOPHEN 325 MG PO TABS
650.0000 mg | ORAL_TABLET | Freq: Once | ORAL | Status: AC
  Administered 2022-03-10: 650 mg via ORAL
  Filled 2022-03-10: qty 2

## 2022-03-10 MED ORDER — SODIUM CHLORIDE 0.9 % IV SOLN
16.0000 mg/kg | Freq: Once | INTRAVENOUS | Status: AC
  Administered 2022-03-10: 1200 mg via INTRAVENOUS
  Filled 2022-03-10: qty 60

## 2022-03-10 MED ORDER — SODIUM CHLORIDE 0.9 % IV SOLN: Freq: Once | INTRAVENOUS | Status: AC

## 2022-03-10 MED ORDER — SODIUM CHLORIDE 0.9 % IV SOLN
20.0000 mg | Freq: Once | INTRAVENOUS | Status: AC
  Administered 2022-03-10: 20 mg via INTRAVENOUS
  Filled 2022-03-10: qty 20

## 2022-03-10 MED ORDER — FAMOTIDINE IN NACL 20-0.9 MG/50ML-% IV SOLN
20.0000 mg | Freq: Once | INTRAVENOUS | Status: AC
  Administered 2022-03-10: 20 mg via INTRAVENOUS
  Filled 2022-03-10: qty 50

## 2022-03-10 MED ORDER — DIPHENHYDRAMINE HCL 25 MG PO CAPS
50.0000 mg | ORAL_CAPSULE | Freq: Once | ORAL | Status: AC
  Administered 2022-03-10: 50 mg via ORAL
  Filled 2022-03-10: qty 2

## 2022-03-10 NOTE — Progress Notes (Signed)
Pt stated he experienced generalized rash & itching while taking pomalyst.  Pt had been taking drug x5 days when rash occurred.  Pt stopped taking drug at that point.  No complaints today.  Dr Irene Limbo aware, will discuss treatment options with next clinic visit.  Will continue daratumumab today.  No additional orders.

## 2022-03-10 NOTE — Patient Instructions (Signed)
[place] ONCOLOGY  Discharge Instructions: Thank you for choosing [facility] to provide your oncology and hematology care.   If you have a lab appointment with the [facility], please go directly to the [facility] and check in at the registration area.   Wear comfortable clothing and clothing appropriate for easy access to any Portacath or PICC line.   We strive to give you quality time with your provider. You may need to reschedule your appointment if you arrive late (15 or more minutes).  Arriving late affects you and other patients whose appointments are after yours.  Also, if you miss three or more appointments without notifying the office, you may be dismissed from the clinic at the provider's discretion.      For prescription refill requests, have your pharmacy contact our office and allow 72 hours for refills to be completed.    Today you received the following chemotherapy and/or immunotherapy agent: Darzalex      To help prevent nausea and vomiting after your treatment, we encourage you to take your nausea medication as directed.  BELOW ARE SYMPTOMS THAT SHOULD BE REPORTED IMMEDIATELY: *FEVER GREATER THAN 100.4 F (38 C) OR HIGHER *CHILLS OR SWEATING *NAUSEA AND VOMITING THAT IS NOT CONTROLLED WITH YOUR NAUSEA MEDICATION *UNUSUAL SHORTNESS OF BREATH *UNUSUAL BRUISING OR BLEEDING *URINARY PROBLEMS (pain or burning when urinating, or frequent urination) *BOWEL PROBLEMS (unusual diarrhea, constipation, pain near the anus) TENDERNESS IN MOUTH AND THROAT WITH OR WITHOUT PRESENCE OF ULCERS (sore throat, sores in mouth, or a toothache) UNUSUAL RASH, SWELLING OR PAIN  UNUSUAL VAGINAL DISCHARGE OR ITCHING   Items with * indicate a potential emergency and should be followed up as soon as possible or go to the Emergency Department if any problems should occur.  Please show the CHEMOTHERAPY ALERT CARD or IMMUNOTHERAPY ALERT CARD at check-in to  the Emergency Department and triage nurse.  Should you have questions after your visit or need to cancel or reschedule your appointment, please contact [facility]  Dept: [phone]  and follow the prompts.  Office hours are 8:00 a.m. to 4:30 p.m. Monday - Friday. Please note that voicemails left after 4:00 p.m. may not be returned until the following business day.  We are closed weekends and major holidays. You have access to a nurse at all times for urgent questions. Please call the main number to the clinic Dept: [phone] and follow the prompts.   For any non-urgent questions, you may also contact your provider using MyChart. We now offer e-Visits for anyone 74 and older to request care online for non-urgent symptoms. For details visit [email or web address].   Also download the MyChart app! Go to the app store, search "MyChart", open the app, select [facility], and log in with your MyChart username and password.  Masks are optional in the cancer centers. If you would like for your care team to wear a mask while they are taking care of you, please let them know. For doctor visits, patients may have with them one support person who is at least 74 years old. At this time, visitors are not allowed in the infusion area.

## 2022-03-10 NOTE — Telephone Encounter (Signed)
Requested medication (s) are due for refill today: yes  Requested medication (s) are on the active medication list: yes  Last refill:  06/06/2021 #30 with 0 RF  Future visit scheduled: 05/08/22, seen 01/02/22  Notes to clinic:  Prescriber not in this practice, please assess.      Requested Prescriptions  Pending Prescriptions Disp Refills   hydrOXYzine (ATARAX) 25 MG tablet 30 tablet 0    Sig: TAKE 1 TABLET (25 MG TOTAL) BY MOUTH 3 (THREE) TIMES DAILY AS NEEDED FOR ITCHING (RASH).     Ear, Nose, and Throat:  Antihistamines 2 Passed - 03/09/2022  8:09 AM      Passed - Cr in normal range and within 360 days    Creatinine  Date Value Ref Range Status  02/24/2022 1.10 0.61 - 1.24 mg/dL Final  07/13/2017 1.7 (H) 0.7 - 1.3 mg/dL Final   Creatinine, POC  Date Value Ref Range Status  11/21/2016 100 mg/dL Final   Creatinine, Urine  Date Value Ref Range Status  07/25/2013 39.31 mg/dL Final         Passed - Valid encounter within last 12 months    Recent Outpatient Visits           4 weeks ago Type 2 diabetes mellitus with peripheral neuropathy [facility])   [place], Jarome Matin, RPH-CPP   2 months ago Type 2 diabetes mellitus with peripheral neuropathy [facility])   [place] Karle Plumber B, MD   6 months ago Type 2 diabetes mellitus with peripheral neuropathy [facility])   [place], Deborah B, MD   1 year ago Type 2 diabetes mellitus with peripheral neuropathy [facility])   [place], Deborah B, MD   1 year ago Type 2 diabetes mellitus with peripheral neuropathy [facility])   [place], MD       Future Appointments             In 6 days Daisy Blossom, Jarome Matin, [place]   In 1 month Wynetta Emery, Dalbert Batman, MD [place]

## 2022-03-14 ENCOUNTER — Other Ambulatory Visit: Payer: Self-pay

## 2022-03-14 ENCOUNTER — Telehealth: Payer: Self-pay | Admitting: Internal Medicine

## 2022-03-14 ENCOUNTER — Other Ambulatory Visit: Payer: Self-pay | Admitting: Hematology

## 2022-03-14 MED ORDER — HYDROXYZINE HCL 25 MG PO TABS
25.0000 mg | ORAL_TABLET | Freq: Three times a day (TID) | ORAL | 0 refills | Status: DC | PRN
  Filled 2022-03-14: qty 30, 10d supply, fill #0

## 2022-03-14 NOTE — Telephone Encounter (Signed)
R/s per 9/5 secure chat, pt daughter confirmed appt

## 2022-03-15 LAB — MULTIPLE MYELOMA PANEL, SERUM
Albumin SerPl Elph-Mcnc: 3.3 g/dL (ref 2.9–4.4)
Albumin/Glob SerPl: 1.4 (ref 0.7–1.7)
Alpha 1: 0.2 g/dL (ref 0.0–0.4)
Alpha2 Glob SerPl Elph-Mcnc: 0.8 g/dL (ref 0.4–1.0)
B-Globulin SerPl Elph-Mcnc: 0.9 g/dL (ref 0.7–1.3)
Gamma Glob SerPl Elph-Mcnc: 0.5 g/dL (ref 0.4–1.8)
Globulin, Total: 2.4 g/dL (ref 2.2–3.9)
IgA: 157 mg/dL (ref 61–437)
IgG (Immunoglobin G), Serum: 461 mg/dL — ABNORMAL LOW (ref 603–1613)
IgM (Immunoglobulin M), Srm: 32 mg/dL (ref 15–143)
Total Protein ELP: 5.7 g/dL — ABNORMAL LOW (ref 6.0–8.5)

## 2022-03-16 ENCOUNTER — Ambulatory Visit: Payer: Medicare Other | Attending: Internal Medicine | Admitting: Pharmacist

## 2022-03-16 ENCOUNTER — Other Ambulatory Visit: Payer: Self-pay

## 2022-03-16 ENCOUNTER — Other Ambulatory Visit: Payer: Self-pay | Admitting: Internal Medicine

## 2022-03-16 DIAGNOSIS — E1142 Type 2 diabetes mellitus with diabetic polyneuropathy: Secondary | ICD-10-CM

## 2022-03-16 LAB — KAPPA/LAMBDA LIGHT CHAINS
Kappa free light chain: 37.7 mg/L — ABNORMAL HIGH (ref 3.3–19.4)
Kappa, lambda light chain ratio: 0.76 (ref 0.26–1.65)
Lambda free light chains: 49.3 mg/L — ABNORMAL HIGH (ref 5.7–26.3)

## 2022-03-16 MED ORDER — ACCU-CHEK GUIDE VI STRP
ORAL_STRIP | 2 refills | Status: DC
Start: 2022-03-16 — End: 2022-05-08
  Filled 2022-03-16: qty 100, 33d supply, fill #0
  Filled 2022-04-18: qty 100, 33d supply, fill #1

## 2022-03-16 NOTE — Progress Notes (Signed)
S:     No chief complaint on file.  Robert Sparks is a 76 y.o. male who presents for diabetes evaluation, education, and management.  PMH is significant for HTN, T2DM with polyneuropathy, HL, CKD stage 3, prostate CA, multiple myeloma in remission.  Patient was referred and last seen by Primary Care Provider, [name], on 01/02/2022.   At last visit, Novolog 70/30 10U BID was changed to Humalog 75/25 7U BID due to insurance coverage.   Today, patient arrives in good spirits and presents without any assistance.   Patient reports Diabetes is longstanding.   Family/Social History:  -Fhx: CKD -Tobacco: current 0.5 PPD smoker -Alcohol: none reported  Current diabetes medications include: Humalog 75/25 - takes 7u BID  Patient reports adherence to taking all medications as prescribed.   Patient reports hypoglycemic events. Patient reports having a couple of isolated readings in the 60s when he wakes up in the morning.    Reported home fasting blood sugars: usually low 100s. He admits that he will see low 200s only when he eats a large meal the night before.  Reported 2 hour post-meal/random blood sugars: 116-120.  Patient denies nocturia (nighttime urination).  Patient reports neuropathy (nerve pain). Patient denies visual changes. Patient reports self foot exams.   Patient reported dietary habits:  -Tries to be adherent with diabetic diet.  Patient-reported exercise habits:  -Patient doesn't have an exercising regimen but tries to have occasional walks about 3 days a week.   O:  Lab Results  Component Value Date   HGBA1C 8.4 (A) 01/02/2022   There were no vitals filed for this visit.  Lipid Panel     Component Value Date/Time   CHOL 184 04/25/2019 0939   TRIG 114 04/25/2019 0939   HDL 88 04/25/2019 0939   CHOLHDL 2.1 04/25/2019 0939   CHOLHDL 3.6 06/21/2016 1047   VLDL 50 (H) 06/21/2016 1047   LDLCALC 76 04/25/2019 0939   Clinical Atherosclerotic  Cardiovascular Disease (ASCVD): No  The 10-year ASCVD risk score (Arnett DK, et al., 2019) is: 54.5%   Values used to calculate the score:     Age: 54 years     Sex: Male     Is Non-Hispanic African American: Yes     Diabetic: Yes     Tobacco smoker: Yes     Systolic Blood Pressure: 494 mmHg     Is BP treated: Yes     HDL Cholesterol: 88 mg/dL     Total Cholesterol: 184 mg/dL   A/P: Diabetes longstanding currently uncontrolled based on A1c. Patient is able to verbalize appropriate hypoglycemia management plan. Medication adherence appears appropriate.  -Continued Humalog 75/25 7 units twice daily. Instructed patient to reduce dose to 6 units twice daily if hypoglycemia persists. -Extensively discussed pathophysiology of diabetes, recommended lifestyle interventions, dietary effects on blood sugar control.  -Counseled on s/sx of and management of hypoglycemia.  -Next A1c anticipated 04/2022.   Written patient instructions provided. Patient verbalized understanding of treatment plan.  Total time in face to face counseling 30 minutes.    Follow-up:  -Follow up with PCP in October.   Patient seen with: Deirdre Evener, PharmD Candidate [employer]  Class of 2025    Pharmacist:  Benard Halsted, PharmD, [place], [place] [phone]

## 2022-03-21 ENCOUNTER — Other Ambulatory Visit: Payer: Self-pay

## 2022-03-21 DIAGNOSIS — C9002 Multiple myeloma in relapse: Secondary | ICD-10-CM

## 2022-03-21 MED ORDER — POMALIDOMIDE 2 MG PO CAPS: 2.0000 mg | ORAL_CAPSULE | Freq: Every day | ORAL | 1 refills | Status: DC

## 2022-03-22 ENCOUNTER — Ambulatory Visit: Payer: Self-pay | Admitting: Licensed Clinical Social Worker

## 2022-03-22 ENCOUNTER — Other Ambulatory Visit: Payer: Self-pay

## 2022-03-22 DIAGNOSIS — C9002 Multiple myeloma in relapse: Secondary | ICD-10-CM

## 2022-03-22 MED FILL — Dexamethasone Sodium Phosphate Inj 100 MG/10ML: INTRAMUSCULAR | Qty: 2 | Status: AC

## 2022-03-22 NOTE — Patient Outreach (Signed)
  Care Coordination   03/22/2022 Name: Robert Sparks MRN: [id number] DOB: Apr 09, 1946   Care Coordination Outreach Attempts:  An unsuccessful telephone outreach was attempted today to offer the patient information about available care coordination services as a benefit of their health plan.   Follow Up Plan:  Additional outreach attempts will be made to offer the patient care coordination information and services.   Encounter Outcome:  No Answer  Care Coordination Interventions Activated:  No   Care Coordination Interventions:  No, not indicated    Christa See, MSW, [email or web address] Phone [phone] 4:36 PM

## 2022-03-23 ENCOUNTER — Inpatient Hospital Stay: Payer: Medicare Other

## 2022-03-23 ENCOUNTER — Other Ambulatory Visit: Payer: Self-pay | Admitting: Hematology

## 2022-03-23 VITALS — BP 126/65 | HR 71 | Temp 98.3°F | Resp 18

## 2022-03-23 DIAGNOSIS — Z5112 Encounter for antineoplastic immunotherapy: Secondary | ICD-10-CM | POA: Diagnosis not present

## 2022-03-23 DIAGNOSIS — Z7189 Other specified counseling: Secondary | ICD-10-CM

## 2022-03-23 DIAGNOSIS — C9002 Multiple myeloma in relapse: Secondary | ICD-10-CM | POA: Diagnosis not present

## 2022-03-23 LAB — CBC WITH DIFFERENTIAL (CANCER CENTER ONLY)
Abs Immature Granulocytes: 0.02 10*3/uL (ref 0.00–0.07)
Basophils Absolute: 0 10*3/uL (ref 0.0–0.1)
Basophils Relative: 1 %
Eosinophils Absolute: 0.3 10*3/uL (ref 0.0–0.5)
Eosinophils Relative: 7 %
HCT: 32.4 % — ABNORMAL LOW (ref 39.0–52.0)
Hemoglobin: 11.2 g/dL — ABNORMAL LOW (ref 13.0–17.0)
Immature Granulocytes: 0 %
Lymphocytes Relative: 23 %
Lymphs Abs: 1.2 10*3/uL (ref 0.7–4.0)
MCH: 34.7 pg — ABNORMAL HIGH (ref 26.0–34.0)
MCHC: 34.6 g/dL (ref 30.0–36.0)
MCV: 100.3 fL — ABNORMAL HIGH (ref 80.0–100.0)
Monocytes Absolute: 0.5 10*3/uL (ref 0.1–1.0)
Monocytes Relative: 9 %
Neutro Abs: 3 10*3/uL (ref 1.7–7.7)
Neutrophils Relative %: 60 %
Platelet Count: 179 10*3/uL (ref 150–400)
RBC: 3.23 MIL/uL — ABNORMAL LOW (ref 4.22–5.81)
RDW: 14.3 % (ref 11.5–15.5)
WBC Count: 5 10*3/uL (ref 4.0–10.5)
nRBC: 0 % (ref 0.0–0.2)

## 2022-03-23 LAB — CMP (CANCER CENTER ONLY)
ALT: 18 U/L (ref 0–44)
AST: 16 U/L (ref 15–41)
Albumin: 3.9 g/dL (ref 3.5–5.0)
Alkaline Phosphatase: 84 U/L (ref 38–126)
Anion gap: 6 (ref 5–15)
BUN: 15 mg/dL (ref 8–23)
CO2: 24 mmol/L (ref 22–32)
Calcium: 8.9 mg/dL (ref 8.9–10.3)
Chloride: 110 mmol/L (ref 98–111)
Creatinine: 1.28 mg/dL — ABNORMAL HIGH (ref 0.61–1.24)
GFR, Estimated: 59 mL/min — ABNORMAL LOW (ref >60)
Glucose, Bld: 170 mg/dL — ABNORMAL HIGH (ref 70–99)
Potassium: 3.8 mmol/L (ref 3.5–5.1)
Sodium: 140 mmol/L (ref 135–145)
Total Bilirubin: 0.5 mg/dL (ref 0.3–1.2)
Total Protein: 5.8 g/dL — ABNORMAL LOW (ref 6.5–8.1)

## 2022-03-23 MED ORDER — FAMOTIDINE IN NACL 20-0.9 MG/50ML-% IV SOLN
20.0000 mg | Freq: Once | INTRAVENOUS | Status: AC
  Administered 2022-03-23: 20 mg via INTRAVENOUS
  Filled 2022-03-23: qty 50

## 2022-03-23 MED ORDER — SODIUM CHLORIDE 0.9 % IV SOLN
16.0000 mg/kg | Freq: Once | INTRAVENOUS | Status: AC
  Administered 2022-03-23: 1200 mg via INTRAVENOUS
  Filled 2022-03-23: qty 60

## 2022-03-23 MED ORDER — DIPHENHYDRAMINE HCL 25 MG PO CAPS
50.0000 mg | ORAL_CAPSULE | Freq: Once | ORAL | Status: AC
  Administered 2022-03-23: 50 mg via ORAL
  Filled 2022-03-23: qty 2

## 2022-03-23 MED ORDER — SODIUM CHLORIDE 0.9 % IV SOLN
20.0000 mg | Freq: Once | INTRAVENOUS | Status: AC
  Administered 2022-03-23: 20 mg via INTRAVENOUS
  Filled 2022-03-23: qty 20

## 2022-03-23 MED ORDER — ACETAMINOPHEN 325 MG PO TABS
650.0000 mg | ORAL_TABLET | Freq: Once | ORAL | Status: AC
  Administered 2022-03-23: 650 mg via ORAL
  Filled 2022-03-23: qty 2

## 2022-03-23 MED ORDER — SODIUM CHLORIDE 0.9 % IV SOLN: Freq: Once | INTRAVENOUS | Status: AC

## 2022-03-23 NOTE — Patient Instructions (Signed)
[facility] MEDICAL ONCOLOGY  Discharge Instructions: Thank you for choosing [facility] to provide your oncology and hematology care.   If you have a lab appointment with the Cancer Center, please go directly to the Cancer Center and check in at the registration area.   Wear comfortable clothing and clothing appropriate for easy access to any Portacath or PICC line.   We strive to give you quality time with your provider. You may need to reschedule your appointment if you arrive late (15 or more minutes).  Arriving late affects you and other patients whose appointments are after yours.  Also, if you miss three or more appointments without notifying the office, you may be dismissed from the clinic at the provider's discretion.      For prescription refill requests, have your pharmacy contact our office and allow 72 hours for refills to be completed.    Today you received the following chemotherapy and/or immunotherapy agents darzalex      To help prevent nausea and vomiting after your treatment, we encourage you to take your nausea medication as directed.  BELOW ARE SYMPTOMS THAT SHOULD BE REPORTED IMMEDIATELY: *FEVER GREATER THAN 100.4 F (38 C) OR HIGHER *CHILLS OR SWEATING *NAUSEA AND VOMITING THAT IS NOT CONTROLLED WITH YOUR NAUSEA MEDICATION *UNUSUAL SHORTNESS OF BREATH *UNUSUAL BRUISING OR BLEEDING *URINARY PROBLEMS (pain or burning when urinating, or frequent urination) *BOWEL PROBLEMS (unusual diarrhea, constipation, pain near the anus) TENDERNESS IN MOUTH AND THROAT WITH OR WITHOUT PRESENCE OF ULCERS (sore throat, sores in mouth, or a toothache) UNUSUAL RASH, SWELLING OR PAIN  UNUSUAL VAGINAL DISCHARGE OR ITCHING   Items with * indicate a potential emergency and should be followed up as soon as possible or go to the Emergency Department if any problems should occur.  Please show the CHEMOTHERAPY ALERT CARD or IMMUNOTHERAPY ALERT CARD at check-in to  the Emergency Department and triage nurse.  Should you have questions after your visit or need to cancel or reschedule your appointment, please contact [facility]COL[facility]  Dept: [phone]  and follow the prompts.  Office hours are 8:00 a.m. to 4:30 p.m. Monday - Friday. Please note that voicemails left after 4:00 p.m. may not be returned until the following business day.  We are closed weekends and major holidays. You have access to a nurse at all times for urgent questions. Please call the main number to the clinic Dept: [phone] and follow the prompts.   For any non-urgent questions, you may also contact your provider using MyChart. We now offer e-Visits for anyone 18 and older to request care online for non-urgent symptoms. For details visit [email or web address].   Also download the MyChart app! Go to the app store, search "MyChart", open the app, select [facility], and log in with your MyChart username and password.  Masks are optional in the cancer centers. If you would like for your care team to wear a mask while they are taking care of you, please let them know. You may have one support person who is at least 74 years old accompany you for your appointments. 

## 2022-03-27 ENCOUNTER — Other Ambulatory Visit: Payer: Self-pay

## 2022-03-29 ENCOUNTER — Other Ambulatory Visit: Payer: Self-pay

## 2022-04-04 ENCOUNTER — Other Ambulatory Visit: Payer: Self-pay

## 2022-04-04 DIAGNOSIS — C9002 Multiple myeloma in relapse: Secondary | ICD-10-CM

## 2022-04-04 MED FILL — Dexamethasone Sodium Phosphate Inj 100 MG/10ML: INTRAMUSCULAR | Qty: 2 | Status: AC

## 2022-04-05 ENCOUNTER — Inpatient Hospital Stay: Payer: Medicare Other

## 2022-04-05 ENCOUNTER — Other Ambulatory Visit: Payer: Self-pay

## 2022-04-05 ENCOUNTER — Inpatient Hospital Stay (HOSPITAL_BASED_OUTPATIENT_CLINIC_OR_DEPARTMENT_OTHER): Payer: Medicare Other | Admitting: Hematology

## 2022-04-05 VITALS — BP 139/73 | HR 75 | Temp 97.5°F | Resp 16 | Ht 67.0 in | Wt 162.2 lb

## 2022-04-05 VITALS — BP 140/70 | HR 80

## 2022-04-05 DIAGNOSIS — Z5111 Encounter for antineoplastic chemotherapy: Secondary | ICD-10-CM

## 2022-04-05 DIAGNOSIS — C9002 Multiple myeloma in relapse: Secondary | ICD-10-CM | POA: Diagnosis not present

## 2022-04-05 DIAGNOSIS — Z7189 Other specified counseling: Secondary | ICD-10-CM

## 2022-04-05 DIAGNOSIS — Z5112 Encounter for antineoplastic immunotherapy: Secondary | ICD-10-CM | POA: Diagnosis not present

## 2022-04-05 LAB — CBC WITH DIFFERENTIAL (CANCER CENTER ONLY)
Abs Immature Granulocytes: 0.01 10*3/uL (ref 0.00–0.07)
Basophils Absolute: 0 10*3/uL (ref 0.0–0.1)
Basophils Relative: 1 %
Eosinophils Absolute: 0.3 10*3/uL (ref 0.0–0.5)
Eosinophils Relative: 5 %
HCT: 34.8 % — ABNORMAL LOW (ref 39.0–52.0)
Hemoglobin: 12.1 g/dL — ABNORMAL LOW (ref 13.0–17.0)
Immature Granulocytes: 0 %
Lymphocytes Relative: 19 %
Lymphs Abs: 1.1 10*3/uL (ref 0.7–4.0)
MCH: 34.9 pg — ABNORMAL HIGH (ref 26.0–34.0)
MCHC: 34.8 g/dL (ref 30.0–36.0)
MCV: 100.3 fL — ABNORMAL HIGH (ref 80.0–100.0)
Monocytes Absolute: 0.4 10*3/uL (ref 0.1–1.0)
Monocytes Relative: 6 %
Neutro Abs: 4.1 10*3/uL (ref 1.7–7.7)
Neutrophils Relative %: 69 %
Platelet Count: 210 10*3/uL (ref 150–400)
RBC: 3.47 MIL/uL — ABNORMAL LOW (ref 4.22–5.81)
RDW: 14.5 % (ref 11.5–15.5)
WBC Count: 5.9 10*3/uL (ref 4.0–10.5)
nRBC: 0 % (ref 0.0–0.2)

## 2022-04-05 LAB — CMP (CANCER CENTER ONLY)
ALT: 18 U/L (ref 0–44)
AST: 17 U/L (ref 15–41)
Albumin: 4.1 g/dL (ref 3.5–5.0)
Alkaline Phosphatase: 82 U/L (ref 38–126)
Anion gap: 6 (ref 5–15)
BUN: 13 mg/dL (ref 8–23)
CO2: 27 mmol/L (ref 22–32)
Calcium: 9.2 mg/dL (ref 8.9–10.3)
Chloride: 107 mmol/L (ref 98–111)
Creatinine: 1.34 mg/dL — ABNORMAL HIGH (ref 0.61–1.24)
GFR, Estimated: 56 mL/min — ABNORMAL LOW (ref >60)
Glucose, Bld: 101 mg/dL — ABNORMAL HIGH (ref 70–99)
Potassium: 3.4 mmol/L — ABNORMAL LOW (ref 3.5–5.1)
Sodium: 140 mmol/L (ref 135–145)
Total Bilirubin: 0.7 mg/dL (ref 0.3–1.2)
Total Protein: 6.6 g/dL (ref 6.5–8.1)

## 2022-04-05 MED ORDER — SODIUM CHLORIDE 0.9 % IV SOLN: Freq: Once | INTRAVENOUS | Status: AC

## 2022-04-05 MED ORDER — ACETAMINOPHEN 325 MG PO TABS
650.0000 mg | ORAL_TABLET | Freq: Once | ORAL | Status: AC
  Administered 2022-04-05: 650 mg via ORAL
  Filled 2022-04-05: qty 2

## 2022-04-05 MED ORDER — FAMOTIDINE IN NACL 20-0.9 MG/50ML-% IV SOLN
20.0000 mg | Freq: Once | INTRAVENOUS | Status: AC
  Administered 2022-04-05: 20 mg via INTRAVENOUS
  Filled 2022-04-05: qty 50

## 2022-04-05 MED ORDER — SODIUM CHLORIDE 0.9 % IV SOLN
20.0000 mg | Freq: Once | INTRAVENOUS | Status: AC
  Administered 2022-04-05: 20 mg via INTRAVENOUS
  Filled 2022-04-05: qty 20

## 2022-04-05 MED ORDER — SODIUM CHLORIDE 0.9 % IV SOLN
16.0000 mg/kg | Freq: Once | INTRAVENOUS | Status: AC
  Administered 2022-04-05: 1200 mg via INTRAVENOUS
  Filled 2022-04-05: qty 60

## 2022-04-05 MED ORDER — DIPHENHYDRAMINE HCL 25 MG PO CAPS
50.0000 mg | ORAL_CAPSULE | Freq: Once | ORAL | Status: AC
  Administered 2022-04-05: 50 mg via ORAL
  Filled 2022-04-05: qty 2

## 2022-04-05 NOTE — Patient Instructions (Signed)
[facility]ICAL [facility]COLOGY  Discharge Instructions: Thank you for choosing [facility] to provide your oncology and hematology care.   If you have a lab appointment with the Cancer Center, please go directly to the Cancer Center and check in at the registration area.   Wear comfortable clothing and clothing appropriate for easy access to any Portacath or PICC line.   We strive to give you quality time with your provider. You may need to reschedule your appointment if you arrive late (15 or more minutes).  Arriving late affects you and other patients whose appointments are after yours.  Also, if you miss three or more appointments without notifying the office, you may be dismissed from the clinic at the provider's discretion.      For prescription refill requests, have your pharmacy contact our office and allow 72 hours for refills to be completed.    Today you received the following chemotherapy and/or immunotherapy agents: daratumumab      To help prevent nausea and vomiting after your treatment, we encourage you to take your nausea medication as directed.  BELOW ARE SYMPTOMS THAT SHOULD BE REPORTED IMMEDIATELY: *FEVER GREATER THAN 100.4 F (38 C) OR HIGHER *CHILLS OR SWEATING *NAUSEA AND VOMITING THAT IS NOT CONTROLLED WITH YOUR NAUSEA MEDICATION *UNUSUAL SHORTNESS OF BREATH *UNUSUAL BRUISING OR BLEEDING *URINARY PROBLEMS (pain or burning when urinating, or frequent urination) *BOWEL PROBLEMS (unusual diarrhea, constipation, pain near the anus) TENDERNESS IN MOUTH AND THROAT WITH OR WITHOUT PRESENCE OF ULCERS (sore throat, sores in mouth, or a toothache) UNUSUAL RASH, SWELLING OR PAIN  UNUSUAL VAGINAL DISCHARGE OR ITCHING   Items with * indicate a potential emergency and should be followed up as soon as possible or go to the Emergency Department if any problems should occur.  Please show the CHEMOTHERAPY ALERT CARD or IMMUNOTHERAPY ALERT CARD at check-in  to the Emergency Department and triage nurse.  Should you have questions after your visit or need to cancel or reschedule your appointment, please contact [facility]COL[facility]  Dept: [phone]  and follow the prompts.  Office hours are 8:00 a.m. to 4:30 p.m. Monday - Friday. Please note that voicemails left after 4:00 p.m. may not be returned until the following business day.  We are closed weekends and major holidays. You have access to a nurse at all times for urgent questions. Please call the main number to the clinic Dept: [phone] and follow the prompts.   For any non-urgent questions, you may also contact your provider using MyChart. We now offer e-Visits for anyone 18 and older to request care online for non-urgent symptoms. For details visit [email or web address].   Also download the MyChart app! Go to the app store, search "MyChart", open the app, select [place], and log in with your MyChart username and password.  Masks are optional in the cancer centers. If you would like for your care team to wear a mask while they are taking care of you, please let them know. You may have one support person who is at least 74 years old accompany you for your appointments. 

## 2022-04-06 ENCOUNTER — Other Ambulatory Visit: Payer: Self-pay

## 2022-04-06 LAB — KAPPA/LAMBDA LIGHT CHAINS
Kappa free light chain: 30.8 mg/L — ABNORMAL HIGH (ref 3.3–19.4)
Kappa, lambda light chain ratio: 0.85 (ref 0.26–1.65)
Lambda free light chains: 36.1 mg/L — ABNORMAL HIGH (ref 5.7–26.3)

## 2022-04-10 ENCOUNTER — Other Ambulatory Visit: Payer: Self-pay

## 2022-04-10 DIAGNOSIS — C9002 Multiple myeloma in relapse: Secondary | ICD-10-CM

## 2022-04-10 LAB — MULTIPLE MYELOMA PANEL, SERUM
Albumin SerPl Elph-Mcnc: 3.7 g/dL (ref 2.9–4.4)
Albumin/Glob SerPl: 1.7 (ref 0.7–1.7)
Alpha 1: 0.2 g/dL (ref 0.0–0.4)
Alpha2 Glob SerPl Elph-Mcnc: 0.8 g/dL (ref 0.4–1.0)
B-Globulin SerPl Elph-Mcnc: 0.9 g/dL (ref 0.7–1.3)
Gamma Glob SerPl Elph-Mcnc: 0.4 g/dL (ref 0.4–1.8)
Globulin, Total: 2.3 g/dL (ref 2.2–3.9)
IgA: 137 mg/dL (ref 61–437)
IgG (Immunoglobin G), Serum: 471 mg/dL — ABNORMAL LOW (ref 603–1613)
IgM (Immunoglobulin M), Srm: 29 mg/dL (ref 15–143)
Total Protein ELP: 6 g/dL (ref 6.0–8.5)

## 2022-04-10 MED ORDER — PREDNISONE 10 MG PO TABS
10.0000 mg | ORAL_TABLET | Freq: Every day | ORAL | 0 refills | Status: DC
  Filled 2022-04-10: qty 21, 21d supply, fill #0

## 2022-04-10 NOTE — Telephone Encounter (Signed)
Contacted pt and instructed pt to take Pepcid 20 mg , Claritin 10 mg  daily. Also informed pt prednisone script had been sent in to his pharmacy. Pt to take these every am while taking pomalyst. Pt acknowledged and verbalized understanding.

## 2022-04-10 NOTE — Progress Notes (Signed)
HEMATOLOGY/ONCOLOGY CLINIC NOTE  Date of Service: 04/05/2022   Patient Care Team: [name], MD as PCP - General (Internal Medicine)  CHIEF COMPLAINTS/PURPOSE OF CONSULTATION:  Follow-up for continued evaluation and management of relapsed multiple myeloma  Oncologic History:  76 y.o. male with diagnosis of IgA lambda active multiple myeloma, ISS Stage I. Active disease diagnosed based on presence of anemia, kidney insufficiency, and paraproteinemia with significant predominance of lambda light chains as well as significant elevation of IgA. Bone marrow biopsy confirmed presence of atypical monoclonal plasma cell process in the bone marrow comprising at least 7% of the cellularity. Based on the findings, patient was started on treatment with lenalidomide, bortezomib, and low-dose dexamethasone based on the anticipated tolerance by the patient. Lenalidomide was started at 10 mg daily based on creatinine clearance of 42, dexamethasone dose was reduced to 20 mg weekly based on patient's age. Treatment was complicated by rapid development of cutaneous rash attributed to lenalidomide, inaddition to decreased renal function and now patient has persistent severe anemia. Side-effects were attributed to Lenalidomide and lenalidomide was discontinued with subsequent recovery.  Patient has been receiving bortezomib weekly with low-dose dexamethasone in 4-day cycles.   Patient has completed induction systemic therapy for his disease with repeat bone marrow biopsy confirming complete response including no evidence of minimal residual disease by cytogenetics or FISH.  HISTORY OF PRESENTING ILLNESS:  Please see previous note for details of initial presentation.  INTERVAL HISTORY:  Mr Tag Wurtz Reif is a 76 y.o. male is here for continued evaluation and management of his relapsed multiple myeloma. He reports He is doing well with no new symptoms or concerns.  He notes he has started his  Pomalidomide had a stop in a few days due to grade 1 macular rash. Rash resolved rapidly. No other systemic reactrions. He is willing to retry the pomalidomide with steroids and antihistamines on board.  No fever, chills, or night sweats. No new infection issues. No new focal bone pains. No other new or acute focal symptoms.  Unchanged lower extremity neuropathy. No acute new toxicities from his daratumumab. Labs done today were discussed in detail with him.  MEDICAL HISTORY:  Past Medical History:  Diagnosis Date   Anemia    Arthritis    shoulders,feet    Cataract    per eye dr -has appt 5-11   CKD (chronic kidney disease), stage III (HCC)    Elevated PSA    History of ketoacidosis    03-29-2015    History of sepsis    07-25-2013  non-compliant w/ medication   Hyperlipidemia    Hypertension    Nocturia    Peripheral neuropathy    Prostate cancer ([place])    Type 2 diabetes mellitus with insulin therapy [facility])     SURGICAL HISTORY: Past Surgical History:  Procedure Laterality Date   CIRCUMCISION  1990's   PROSTATE BIOPSY N/A 12/21/2015   Procedure: BIOPSY TRANSRECTAL ULTRASONIC PROSTATE (TUBP);  Surgeon: Festus Aloe, MD;  Location: [facility];  Service: Urology;  Laterality: N/A;    SOCIAL HISTORY: Social History   Socioeconomic History   Marital status: Single    Spouse name: Not on file   Number of children: Not on file   Years of education: Not on file   Highest education level: Not on file  Occupational History   Not on file  Tobacco Use   Smoking status: Every Day    Packs/day: 0.50    Years:  50.00    Total pack years: 25.00    Types: Cigarettes   Smokeless tobacco: Never   Tobacco comments:    1 ppwk-4-5 a day   Vaping Use   Vaping Use: Never used  Substance and Sexual Activity   Alcohol use: Yes    Alcohol/week: 1.0 standard drink of alcohol    Types: 1 Cans of beer per week    Comment: 1 every day-quit couple weeks ago   Drug  use: No   Sexual activity: Not Currently  Other Topics Concern   Not on file  Social History Narrative   Not on file   Social Determinants of Health   Financial Resource Strain: Not on file  Food Insecurity: Not on file  Transportation Needs: Not on file  Physical Activity: Not on file  Stress: Not on file  Social Connections: Not on file  Intimate Partner Violence: Not on file    FAMILY HISTORY: Family History  Problem Relation Age of Onset   Kidney disease Mother    Cancer Neg Hx    Colon cancer Neg Hx    Colon polyps Neg Hx    Esophageal cancer Neg Hx    Rectal cancer Neg Hx    Stomach cancer Neg Hx     ALLERGIES:  is allergic to other.  MEDICATIONS:  Current Outpatient Medications  Medication Sig Dispense Refill   Accu-Chek Softclix Lancets lancets Use to test blood sugar 3 times daily. 100 each 3   acyclovir (ZOVIRAX) 400 MG tablet Take 1 tablet (400 mg total) by mouth 2 (two) times daily. 60 tablet 11   amLODipine (NORVASC) 10 MG tablet TAKE 1 TABLET (10 MG TOTAL) BY MOUTH DAILY. 90 tablet 3   aspirin EC 81 MG tablet Take 81 mg by mouth daily. Swallow whole.     b complex vitamins capsule Take 1 capsule by mouth daily. 30 capsule 5   Blood Glucose Monitoring Suppl (ACCU-CHEK GUIDE) w/Device KIT Use to test blood sugar three times daily 1 kit 0   Continuous Blood Gluc Receiver (FREESTYLE LIBRE READER) DEVI Use to check blood sugar 3x daily. E11.42 1 each 0   Continuous Blood Gluc Sensor (FREESTYLE LIBRE SENSOR SYSTEM) MISC Use to check blood sugar 3 times daily. Change sensor every 2 weeks. 2 each 12   dexamethasone (DECADRON) 4 MG tablet 35m (5 tabs) Take the day after each dose of daratumumab. Take with breakfast 20 tablet 11   diclofenac Sodium (VOLTAREN) 1 % GEL Apply 4 g topically 4 (four) times daily. 150 g 5   donepezil (ARICEPT) 5 MG tablet TAKE 1 TABLET (5 MG TOTAL) BY MOUTH AT BEDTIME. FOR MEMORY ISSUES 30 tablet 5   ferrous sulfate 325 (65 FE) MG tablet  Take 325 mg by mouth daily.     gabapentin (NEURONTIN) 300 MG capsule TAKE 1 CAPSULE BY MOUTH IN THE MORNING AND LUNCH, AND 2 AT BEDTIME 120 capsule 6   glucose blood (ACCU-CHEK GUIDE) test strip Use to test blood sugar 3 times daily. 100 each 2   hydrOXYzine (ATARAX) 25 MG tablet TAKE 1 TABLET (25 MG TOTAL) BY MOUTH 3 (THREE) TIMES DAILY AS NEEDED FOR ITCHING (RASH). 30 tablet 0   Insulin Lispro Prot & Lispro (HUMALOG MIX 75/25 KWIKPEN) (75-25) 100 UNIT/ML Kwikpen inject 7 units with breakfast and dinner. 15 mL 11   Insulin Pen Needle (BD PEN NEEDLE NANO U/F) 32G X 4 MM MISC USE AS DIRECTED TWICE DAILY WITH INSULIN 100 each  6   lisinopril (ZESTRIL) 2.5 MG tablet Take 1 tablet (2.5 mg total) by mouth daily. 30 tablet 4   Miconazole Nitrate (ATHLETES FOOT POWDER SPRAY) 2 % AERP Spray between toes and under toes once daily. 130 g 4   Multiple Vitamin (MULTIVITAMIN WITH MINERALS) TABS tablet Take 1 tablet by mouth daily. 60 tablet 2   ondansetron (ZOFRAN) 8 MG tablet Take 1 tablet (8 mg total) by mouth 2 (two) times daily as needed (Nausea or vomiting). 30 tablet 1   polyethylene glycol (MIRALAX) 17 g packet Take 17 g by mouth daily. 30 each 2   pomalidomide (POMALYST) 2 MG capsule Take 1 capsule (2 mg total) by mouth daily. 3 weeks on 1 week off 21 capsule 1   pravastatin (PRAVACHOL) 20 MG tablet TAKE 1 TABLET (20 MG TOTAL) BY MOUTH DAILY. 90 tablet 2   predniSONE (DELTASONE) 10 MG tablet Take 1 tablet (10 mg total) by mouth daily with breakfast for 21 days. 21 tablet 0   prochlorperazine (COMPAZINE) 10 MG tablet Take 1 tablet (10 mg total) by mouth every 6 (six) hours as needed (Nausea or vomiting). 30 tablet 1   tadalafil (CIALIS) 20 MG tablet Take 20 mg by mouth as needed.     tiZANidine (ZANAFLEX) 2 MG tablet Take 1 tablet (2 mg total) by mouth at bedtime as needed for muscle spasms. 30 tablet 1   vitamin B-12 (CYANOCOBALAMIN) 100 MCG tablet Take 100 mcg by mouth daily.     No current  facility-administered medications for this visit.    ROS 10 Point review of Systems was done is negative except as noted above.   PHYSICAL EXAMINATION: .BP 139/73 (BP Location: Left Arm, Patient Position: Sitting)   Pulse 75   Temp (!) 97.5 F (36.4 C) (Temporal)   Resp 16   Ht '5\' 7"'  (1.702 m)   Wt 162 lb 3.2 oz (73.6 kg)   SpO2 99%   BMI 25.40 kg/m  NAD GENERAL:alert, in no acute distress and comfortable SKIN: no acute rashes, no significant lesions EYES: conjunctiva are pink and non-injected, sclera anicteric NECK: supple, no JVD LYMPH:  no palpable lymphadenopathy in the cervical, axillary or inguinal regions LUNGS: clear to auscultation b/l with normal respiratory effort HEART: regular rate & rhythm ABDOMEN:  normoactive bowel sounds , non tender, not distended. Extremity: no pedal edema PSYCH: alert & oriented x 3 with fluent speech NEURO: no focal motor/sensory deficits  LABORATORY DATA:  I have reviewed the data as listed  .    Latest Ref Rng & Units 04/05/2022   10:20 AM 03/23/2022   11:59 AM 03/10/2022   11:46 AM  CBC  WBC 4.0 - 10.5 K/uL 5.9  5.0  4.4   Hemoglobin 13.0 - 17.0 g/dL 12.1  11.2  11.2   Hematocrit 39.0 - 52.0 % 34.8  32.4  32.2   Platelets 150 - 400 K/uL 210  179  220       Latest Ref Rng & Units 04/05/2022   10:20 AM 03/23/2022   11:59 AM 03/10/2022   11:46 AM  CMP  Glucose 70 - 99 mg/dL 101  170  220   BUN 8 - 23 mg/dL '13  15  15   ' Creatinine 0.61 - 1.24 mg/dL 1.34  1.28  1.23   Sodium 135 - 145 mmol/L 140  140  136   Potassium 3.5 - 5.1 mmol/L 3.4  3.8  3.6   Chloride 98 - 111 mmol/L 107  110  108   CO2 22 - 32 mmol/L '27  24  23   ' Calcium 8.9 - 10.3 mg/dL 9.2  8.9  9.3   Total Protein 6.5 - 8.1 g/dL 6.6  5.8  6.3   Total Bilirubin 0.3 - 1.2 mg/dL 0.7  0.5  0.5   Alkaline Phos 38 - 126 U/L 82  84  67   AST 15 - 41 U/L '17  16  19   ' ALT 0 - 44 U/L '18  18  27      ' 08/29/17 BM Bx:    08/29/17 Cytogenetics:   03/13/17  Cytogenetics:        PATHOLOGY Surgical Pathology  CASE: [id number]  PATIENT: Robert Sparks  Bone Marrow Report      Clinical History: Multiple myeloma in relapse ([place]) ,([facility])      DIAGNOSIS:   BONE MARROW, ASPIRATE, CLOT, CORE:  -Hypercellular bone marrow for age with plasma cell neoplasm  -See comment   PERIPHERAL BLOOD:  -Mild normocytic-normochromic anemia  -Eosinophilia   COMMENT:   The bone marrow is hypercellular for age with increased number of plasma  cells representing 8% of all cells in the aspirate associated with  numerous small clusters in the clot and biopsy sections. The plasma  cells display lambda light chain restriction consistent with plasma cell  neoplasm.  The background shows trilineage hematopoiesis with generally  nonspecific myeloid changes, likely secondary in nature in this setting.  Correlation with cytogenetic and FISH studies is recommended.      RADIOGRAPHIC STUDIES: I have personally reviewed the radiological images as listed and agreed with the findings in the report. No results found.  ASSESSMENT & PLAN:   76 y.o. male with   1.  Relapsed IgA Lambda Multiple Myeloma ISS Stage I    Active disease was previously diagnosed based on presence of anemia, kidney insufficiency, and paraproteinemia with significant predominance of lambda light chains as well as significant elevation of IgA. Patient is status post patient was treated with induction bortezomib Revlimid dexamethasone.  Had issues with skin rashes with Revlimid and this was discontinued.  Completed bortezomib dexamethasone induction and achieved complete remission. Was on maintenance Velcade 2 weeks which was then switched to maintenance Ninlaro. Patient had issues with worsening neuropathy which was a combination of Velcade and his diabetes.  Ninlaro was discontinued as well after maintenance of more than 2 years.  2.  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 Bone marrow biopsy shows 8% lambda restricted plasma cells PET CT scan with no hypermetabolic osseous lesions  3.  Hypertension 4.  Diabetes type 2 5 .chronic kidney disease due to hypertension and diabetes. 6.  Peripheral neuropathy related to diabetes and previous myeloma treatments.  Plan -Labs done today show stable CBC CMP . Myeloma panel from today shows . K/L light chains labs shows KFLC is 30.8 mg/L and LFLC is 36.1 mg/L. Previous myeloma panel had shown a bump in his M protein to 0.5 g/dL and then also had an unquantifiable M protein due to coagulation with the beta fraction.  -Dugrade 1 rash from pomalidomide 2 mg daily 3 weeks on 1 week off, currently off with resolution of rash. Will restart Pomalidomide with antihistamines and Prednisone 21m po daily on board. -No new focal symptoms related to multiple myeloma. -No new toxicities related to his daratumumab. -We will continue daratumumab with same supportive medications. -  He was again reminded to start aspirin 81 mg p.o. daily for VTE prophylaxis -Continue acyclovir for VZV prophylaxis. -Given his neuropathy he was recommended to use a walking stick or cane for additional balance support to reduce the risk of falls.  FOLLOW UP: As per orders   The total time spent in the appointment was 30 minutes*.  All of the patient's questions were answered with apparent satisfaction. The patient knows to call the clinic with any problems, questions or concerns.   Sullivan Lone MD MS AAHIVMS [facility] Hematology/Oncology Physician [facility]  .*Total Encounter Time as defined by the Centers for Medicare and Medicaid Services includes, in addition to the face-to-face time of a patient visit (documented in the note above) non-face-to-face time: obtaining and reviewing outside history, ordering and reviewing medications, tests or procedures,  care coordination (communications with other health care professionals or caregivers) and documentation in the medical record.  I, Melene Muller, am acting as scribe for Dr. Sullivan Lone, MD.  .I have reviewed the above documentation for accuracy and completeness, and I agree with the above. Brunetta Genera MD

## 2022-04-11 ENCOUNTER — Other Ambulatory Visit: Payer: Self-pay | Admitting: Internal Medicine

## 2022-04-11 ENCOUNTER — Encounter: Payer: Self-pay | Admitting: Hematology

## 2022-04-11 ENCOUNTER — Other Ambulatory Visit: Payer: Self-pay

## 2022-04-11 MED ORDER — GABAPENTIN 300 MG PO CAPS
ORAL_CAPSULE | ORAL | 6 refills | Status: DC
  Filled 2022-04-11: qty 120, 30d supply, fill #0
  Filled 2022-05-16: qty 120, 30d supply, fill #1
  Filled 2022-06-20: qty 120, 30d supply, fill #2
  Filled 2022-07-24 (×2): qty 120, 30d supply, fill #3
  Filled 2022-08-31: qty 120, 30d supply, fill #4
  Filled 2022-10-09: qty 120, 30d supply, fill #5

## 2022-04-11 NOTE — Telephone Encounter (Signed)
Requested Prescriptions  Pending Prescriptions Disp Refills  . gabapentin (NEURONTIN) 300 MG capsule 120 capsule 6    Sig: TAKE 1 CAPSULE BY MOUTH IN THE MORNING AND LUNCH, AND 2 AT BEDTIME     Neurology: Anticonvulsants - gabapentin Failed - 04/11/2022  9:12 AM      Failed - Cr in normal range and within 360 days    Creatinine  Date Value Ref Range Status  04/05/2022 1.34 (H) 0.61 - 1.24 mg/dL Final  07/13/2017 1.7 (H) 0.7 - 1.3 mg/dL Final   Creatinine, POC  Date Value Ref Range Status  11/21/2016 100 mg/dL Final   Creatinine, Urine  Date Value Ref Range Status  07/25/2013 39.31 mg/dL Final         Passed - Completed PHQ-2 or PHQ-9 in the last 360 days      Passed - Valid encounter within last 12 months    Recent Outpatient Visits          3 weeks ago Type 2 diabetes mellitus with peripheral neuropathy [facility])   [place], Jarome Matin, RPH-CPP   2 months ago Type 2 diabetes mellitus with peripheral neuropathy [facility])   [place], Jarome Matin, RPH-CPP   3 months ago Type 2 diabetes mellitus with peripheral neuropathy [facility])   [place] Karle Plumber B, MD   7 months ago Type 2 diabetes mellitus with peripheral neuropathy [facility])   [place], Deborah B, MD   1 year ago Type 2 diabetes mellitus with peripheral neuropathy [facility])   [place], MD      Future Appointments            In 3 weeks Wynetta Emery Dalbert Batman, MD [place]

## 2022-04-12 ENCOUNTER — Other Ambulatory Visit: Payer: Self-pay

## 2022-04-18 ENCOUNTER — Other Ambulatory Visit: Payer: Self-pay

## 2022-04-19 ENCOUNTER — Other Ambulatory Visit: Payer: Self-pay

## 2022-04-19 DIAGNOSIS — C9002 Multiple myeloma in relapse: Secondary | ICD-10-CM

## 2022-04-20 ENCOUNTER — Other Ambulatory Visit: Payer: Self-pay

## 2022-04-20 ENCOUNTER — Inpatient Hospital Stay: Payer: Medicare Other | Attending: Hematology

## 2022-04-20 ENCOUNTER — Inpatient Hospital Stay: Payer: Medicare Other

## 2022-04-20 VITALS — BP 155/79 | HR 74 | Temp 97.6°F | Resp 20 | Wt 163.0 lb

## 2022-04-20 DIAGNOSIS — Z7189 Other specified counseling: Secondary | ICD-10-CM

## 2022-04-20 DIAGNOSIS — C9002 Multiple myeloma in relapse: Secondary | ICD-10-CM | POA: Insufficient documentation

## 2022-04-20 DIAGNOSIS — Z5112 Encounter for antineoplastic immunotherapy: Secondary | ICD-10-CM | POA: Diagnosis not present

## 2022-04-20 LAB — CMP (CANCER CENTER ONLY)
ALT: 21 U/L (ref 0–44)
AST: 17 U/L (ref 15–41)
Albumin: 4 g/dL (ref 3.5–5.0)
Alkaline Phosphatase: 94 U/L (ref 38–126)
Anion gap: 7 (ref 5–15)
BUN: 16 mg/dL (ref 8–23)
CO2: 25 mmol/L (ref 22–32)
Calcium: 8.9 mg/dL (ref 8.9–10.3)
Chloride: 104 mmol/L (ref 98–111)
Creatinine: 1.14 mg/dL (ref 0.61–1.24)
GFR, Estimated: >60 mL/min (ref >60)
Glucose, Bld: 287 mg/dL — ABNORMAL HIGH (ref 70–99)
Potassium: 3.3 mmol/L — ABNORMAL LOW (ref 3.5–5.1)
Sodium: 136 mmol/L (ref 135–145)
Total Bilirubin: 0.7 mg/dL (ref 0.3–1.2)
Total Protein: 6.4 g/dL — ABNORMAL LOW (ref 6.5–8.1)

## 2022-04-20 LAB — CBC WITH DIFFERENTIAL (CANCER CENTER ONLY)
Abs Immature Granulocytes: 0.03 10*3/uL (ref 0.00–0.07)
Basophils Absolute: 0 10*3/uL (ref 0.0–0.1)
Basophils Relative: 0 %
Eosinophils Absolute: 0.1 10*3/uL (ref 0.0–0.5)
Eosinophils Relative: 1 %
HCT: 33.2 % — ABNORMAL LOW (ref 39.0–52.0)
Hemoglobin: 11.9 g/dL — ABNORMAL LOW (ref 13.0–17.0)
Immature Granulocytes: 0 %
Lymphocytes Relative: 7 %
Lymphs Abs: 0.5 10*3/uL — ABNORMAL LOW (ref 0.7–4.0)
MCH: 35 pg — ABNORMAL HIGH (ref 26.0–34.0)
MCHC: 35.8 g/dL (ref 30.0–36.0)
MCV: 97.6 fL (ref 80.0–100.0)
Monocytes Absolute: 0.4 10*3/uL (ref 0.1–1.0)
Monocytes Relative: 5 %
Neutro Abs: 6.4 10*3/uL (ref 1.7–7.7)
Neutrophils Relative %: 87 %
Platelet Count: 202 10*3/uL (ref 150–400)
RBC: 3.4 MIL/uL — ABNORMAL LOW (ref 4.22–5.81)
RDW: 14 % (ref 11.5–15.5)
WBC Count: 7.4 10*3/uL (ref 4.0–10.5)
nRBC: 0 % (ref 0.0–0.2)

## 2022-04-20 MED ORDER — SODIUM CHLORIDE 0.9 % IV SOLN: Freq: Once | INTRAVENOUS | Status: AC

## 2022-04-20 MED ORDER — DIPHENHYDRAMINE HCL 25 MG PO CAPS
50.0000 mg | ORAL_CAPSULE | Freq: Once | ORAL | Status: AC
  Administered 2022-04-20: 50 mg via ORAL
  Filled 2022-04-20: qty 2

## 2022-04-20 MED ORDER — ACETAMINOPHEN 325 MG PO TABS
650.0000 mg | ORAL_TABLET | Freq: Once | ORAL | Status: AC
  Administered 2022-04-20: 650 mg via ORAL
  Filled 2022-04-20: qty 2

## 2022-04-20 MED ORDER — SODIUM CHLORIDE 0.9 % IV SOLN
20.0000 mg | Freq: Once | INTRAVENOUS | Status: AC
  Administered 2022-04-20: 20 mg via INTRAVENOUS
  Filled 2022-04-20: qty 20

## 2022-04-20 MED ORDER — SODIUM CHLORIDE 0.9 % IV SOLN
16.0000 mg/kg | Freq: Once | INTRAVENOUS | Status: AC
  Administered 2022-04-20: 1200 mg via INTRAVENOUS
  Filled 2022-04-20: qty 60

## 2022-04-20 MED ORDER — FAMOTIDINE IN NACL 20-0.9 MG/50ML-% IV SOLN
20.0000 mg | Freq: Once | INTRAVENOUS | Status: AC
  Administered 2022-04-20: 20 mg via INTRAVENOUS
  Filled 2022-04-20: qty 50

## 2022-04-20 NOTE — Patient Instructions (Signed)
[facility] MEDICAL [facility]COLOGY  Discharge Instructions: Thank you for choosing [facility] to provide your oncology and hematology care.   If you have a lab appointment with the Cancer Center, please go directly to the Cancer Center and check in at the registration area.   Wear comfortable clothing and clothing appropriate for easy access to any Portacath or PICC line.   We strive to give you quality time with your provider. You may need to reschedule your appointment if you arrive late (15 or more minutes).  Arriving late affects you and other patients whose appointments are after yours.  Also, if you miss three or more appointments without notifying the office, you may be dismissed from the clinic at the provider's discretion.      For prescription refill requests, have your pharmacy contact our office and allow 72 hours for refills to be completed.    Today you received the following chemotherapy and/or immunotherapy agents: daratumumab      To help prevent nausea and vomiting after your treatment, we encourage you to take your nausea medication as directed.  BELOW ARE SYMPTOMS THAT SHOULD BE REPORTED IMMEDIATELY: *FEVER GREATER THAN 100.4 F (38 C) OR HIGHER *CHILLS OR SWEATING *NAUSEA AND VOMITING THAT IS NOT CONTROLLED WITH YOUR NAUSEA MEDICATION *UNUSUAL SHORTNESS OF BREATH *UNUSUAL BRUISING OR BLEEDING *URINARY PROBLEMS (pain or burning when urinating, or frequent urination) *BOWEL PROBLEMS (unusual diarrhea, constipation, pain near the anus) TENDERNESS IN MOUTH AND THROAT WITH OR WITHOUT PRESENCE OF ULCERS (sore throat, sores in mouth, or a toothache) UNUSUAL RASH, SWELLING OR PAIN  UNUSUAL VAGINAL DISCHARGE OR ITCHING   Items with * indicate a potential emergency and should be followed up as soon as possible or go to the Emergency Department if any problems should occur.  Please show the CHEMOTHERAPY ALERT CARD or IMMUNOTHERAPY ALERT CARD at check-in  to the Emergency Department and triage nurse.  Should you have questions after your visit or need to cancel or reschedule your appointment, please contact [facility]  Dept: [phone]  and follow the prompts.  Office hours are 8:00 a.m. to 4:30 p.m. Monday - Friday. Please note that voicemails left after 4:00 p.m. may not be returned until the following business day.  We are closed weekends and major holidays. You have access to a nurse at all times for urgent questions. Please call the main number to the clinic Dept: [phone] and follow the prompts.   For any non-urgent questions, you may also contact your provider using MyChart. We now offer e-Visits for anyone 18 and older to request care online for non-urgent symptoms. For details visit [email or web address].   Also download the MyChart app! Go to the app store, search "MyChart", open the app, select , and log in with your MyChart username and password.  Masks are optional in the cancer centers. If you would like for your care team to wear a mask while they are taking care of you, please let them know. You may have one support person who is at least 74 years old accompany you for your appointments. 

## 2022-04-21 ENCOUNTER — Other Ambulatory Visit: Payer: Self-pay

## 2022-04-21 DIAGNOSIS — C9002 Multiple myeloma in relapse: Secondary | ICD-10-CM

## 2022-04-21 MED ORDER — POMALIDOMIDE 2 MG PO CAPS: 2.0000 mg | ORAL_CAPSULE | Freq: Every day | ORAL | 1 refills | Status: DC

## 2022-04-27 ENCOUNTER — Other Ambulatory Visit: Payer: Self-pay | Admitting: Hematology

## 2022-04-27 ENCOUNTER — Other Ambulatory Visit: Payer: Self-pay

## 2022-04-27 DIAGNOSIS — C9002 Multiple myeloma in relapse: Secondary | ICD-10-CM

## 2022-04-27 MED ORDER — POMALIDOMIDE 2 MG PO CAPS: 2.0000 mg | ORAL_CAPSULE | Freq: Every day | ORAL | 1 refills | Status: DC

## 2022-05-02 ENCOUNTER — Other Ambulatory Visit: Payer: Self-pay | Admitting: Hematology

## 2022-05-02 ENCOUNTER — Other Ambulatory Visit: Payer: Self-pay

## 2022-05-02 DIAGNOSIS — C9002 Multiple myeloma in relapse: Secondary | ICD-10-CM

## 2022-05-02 MED ORDER — PREDNISONE 10 MG PO TABS
10.0000 mg | ORAL_TABLET | Freq: Every day | ORAL | 0 refills | Status: DC
  Filled 2022-05-02: qty 21, 21d supply, fill #0

## 2022-05-03 ENCOUNTER — Other Ambulatory Visit: Payer: Self-pay

## 2022-05-08 ENCOUNTER — Encounter: Payer: Self-pay | Admitting: Internal Medicine

## 2022-05-08 ENCOUNTER — Other Ambulatory Visit: Payer: Self-pay | Admitting: Hematology

## 2022-05-08 ENCOUNTER — Telehealth: Payer: Self-pay

## 2022-05-08 ENCOUNTER — Other Ambulatory Visit: Payer: Self-pay

## 2022-05-08 ENCOUNTER — Ambulatory Visit: Payer: Medicare Other | Attending: Internal Medicine | Admitting: Internal Medicine

## 2022-05-08 VITALS — BP 140/78 | HR 75 | Wt 169.6 lb

## 2022-05-08 DIAGNOSIS — Z23 Encounter for immunization: Secondary | ICD-10-CM

## 2022-05-08 DIAGNOSIS — E1142 Type 2 diabetes mellitus with diabetic polyneuropathy: Secondary | ICD-10-CM | POA: Diagnosis not present

## 2022-05-08 DIAGNOSIS — Z7189 Other specified counseling: Secondary | ICD-10-CM

## 2022-05-08 DIAGNOSIS — T464X5A Adverse effect of angiotensin-converting-enzyme inhibitors, initial encounter: Secondary | ICD-10-CM | POA: Diagnosis not present

## 2022-05-08 DIAGNOSIS — R058 Other specified cough: Secondary | ICD-10-CM

## 2022-05-08 DIAGNOSIS — F17210 Nicotine dependence, cigarettes, uncomplicated: Secondary | ICD-10-CM | POA: Diagnosis not present

## 2022-05-08 DIAGNOSIS — E1169 Type 2 diabetes mellitus with other specified complication: Secondary | ICD-10-CM | POA: Diagnosis not present

## 2022-05-08 DIAGNOSIS — I152 Hypertension secondary to endocrine disorders: Secondary | ICD-10-CM

## 2022-05-08 DIAGNOSIS — C9002 Multiple myeloma in relapse: Secondary | ICD-10-CM | POA: Diagnosis not present

## 2022-05-08 DIAGNOSIS — E785 Hyperlipidemia, unspecified: Secondary | ICD-10-CM | POA: Diagnosis not present

## 2022-05-08 DIAGNOSIS — F17200 Nicotine dependence, unspecified, uncomplicated: Secondary | ICD-10-CM

## 2022-05-08 DIAGNOSIS — E1159 Type 2 diabetes mellitus with other circulatory complications: Secondary | ICD-10-CM

## 2022-05-08 LAB — POCT GLYCOSYLATED HEMOGLOBIN (HGB A1C): HbA1c, POC (controlled diabetic range): 7.9 % — AB (ref 0.0–7.0)

## 2022-05-08 LAB — GLUCOSE, POCT (MANUAL RESULT ENTRY): POC Glucose: 96 mg/dl (ref 70–99)

## 2022-05-08 MED ORDER — VALSARTAN 40 MG PO TABS
40.0000 mg | ORAL_TABLET | Freq: Every day | ORAL | 3 refills | Status: DC
  Filled 2022-05-08: qty 90, 90d supply, fill #0
  Filled 2022-08-15: qty 90, 90d supply, fill #1
  Filled 2022-11-14: qty 90, 90d supply, fill #2
  Filled 2023-03-01 (×2): qty 60, 60d supply, fill #3

## 2022-05-08 MED ORDER — ACCU-CHEK GUIDE VI STRP
ORAL_STRIP | 2 refills | Status: DC
  Filled 2022-05-08: qty 100, 25d supply, fill #0
  Filled 2022-05-31: qty 100, 33d supply, fill #1
  Filled 2022-06-22 – 2022-06-26 (×2): qty 100, 33d supply, fill #2

## 2022-05-08 MED ORDER — INSULIN LISPRO PROT & LISPRO (75-25 MIX) 100 UNIT/ML KWIKPEN
PEN_INJECTOR | SUBCUTANEOUS | 11 refills | Status: DC
  Filled 2022-05-08: qty 15, 93d supply, fill #0
  Filled 2022-07-17: qty 15, 90d supply, fill #0

## 2022-05-08 NOTE — Progress Notes (Signed)
Patient ID: Robert Sparks, male    DOB: August 27, 1945  MRN: [id number]  CC: Medication Refill and Diabetes   Subjective: Robert Sparks is a 76 y.o. male who presents for chronic ds management His concerns today include:  Patient with history of HTN, DM 2 with polyneuropathy, HL, CKD stage 3, tob dependence, EtOH abuse, prostate CA, OA knee, vit B 12 def, spinal stenosis, and multiple myeloma in remission, memory changes MMSE 24/30 2021).    DM: A1C 7.9 Results for orders placed or performed in visit on 05/08/22  POCT glucose (manual entry)  Result Value Ref Range   POC Glucose 96 70 - 99 mg/dl  POCT glycosylated hemoglobin (Hb A1C)  Result Value Ref Range   Hemoglobin A1C     HbA1c POC (<> result, manual entry)     HbA1c, POC (prediabetic range)     HbA1c, POC (controlled diabetic range) 7.9 (A) 0.0 - 7.0 %   *Note: Due to a large number of results and/or encounters for the requested time period, some results have not been displayed. A complete set of results can be found in Results Review.  Seen by clinical pharmacist since last visit with me.  NovoLog 70/30 had to be changed to Humalog 75/25 based on his insurance coverage.  Currently on 7 units twice a day. Takes 9 units BID when on Prednisone for his MM treatment Did not get Elenor Legato due to cost Blood sugar range: checks BS 3-4 x/day.  Does not have log or his meter.  Gives range 90s in a.m, before lunch in 120s and bedtime 150s Hypoglycemic episodes: occasional BS 70s.  He can tell when BS low.  Travels with hard candy in pocket Eating habits:  "I just eat.  I just eat too much."  Thinks due to Prednisone.  Started on 10 mg 05/02/2022 for 21 days.    HTN: Reports compliance with lisinopril 2.5 mg and Norvasc 10 mg daily. Took meds already for this a.m Limits salt in the foods, Checks BP daily.  Reports SBP 130-147/does not recall DBP Reports cough with lisinopril.  HL: Reports compliance with Pravachol.  Tob dep: On last  visit with me, he has had 6 to 7 cigarettes/day. "I know I should quit" but not ready to.   Qualifies for lung cancer screening but had PET scan 05/2021 that revealed no suspicious lung lesions.  CKD 3 yea: Most recent creatinine 1.14, and GFR greater than 60.  GFR range has been 56-60.  Multiple myeloma IgA lambda type: Continues to follow with his hematologist Dr. Irene Limbo.  On Pomalyst.  Med makes him drowsy and dizzy.  Feels off balance at times.  Also gets daratumumab infusion 2x/mth. Has some mild stable anemia.  HM:  had flu shot and COVID booster at [facility] at [place] a few wks ago.  Due for shingles vaccine.  On Zovirax for VZV prophylaxis.   Patient Active Problem List   Diagnosis Date Noted   Pain in right shoulder 01/06/2022   Port-A-Cath in place 11/02/2021   Macroalbuminuric diabetic nephropathy ([place]) 08/31/2021   Multiple myeloma in relapse ([place]) 06/24/2021   Counseling regarding advance care planning and goals of care 06/24/2021   Memory changes 04/25/2019   Hyperlipidemia associated with type 2 diabetes mellitus ([place]) 04/25/2019   Dementia associated with other underlying disease without behavioral disturbance ([place]) 03/14/2019   Chronic bilateral low back pain without sciatica 03/14/2019   Spinal stenosis, lumbar region with neurogenic claudication 03/14/2019  History of prostate cancer 12/20/2018   Neuropathy of right hand 12/20/2018   Primary osteoarthritis of right knee 01/07/2018   Diabetic polyneuropathy associated with type 2 diabetes mellitus ([place]) 01/07/2018   Vitamin B12 deficiency 09/04/2017   Cataract of both eyes 04/12/2017   Tobacco dependence 04/12/2017   Multiple myeloma in remission ([place]) 02/23/2017   Hyperlipidemia 06/21/2016   CKD (chronic kidney disease) stage 3, GFR 30-59 ml/min (HCC) 02/23/2016   Malignant neoplasm of prostate ([place]) 02/21/2016   Controlled type 2 diabetes mellitus with stage 3 chronic kidney disease, with long-term  current use of insulin ([place]) 03/29/2015   HTN (hypertension) 07/28/2013   Anemia, chronic disease 07/26/2013   ETOH abuse 07/25/2013     Current Outpatient Medications on File Prior to Visit  Medication Sig Dispense Refill   Accu-Chek Softclix Lancets lancets Use to test blood sugar 3 times daily. 100 each 3   acyclovir (ZOVIRAX) 400 MG tablet Take 1 tablet (400 mg total) by mouth 2 (two) times daily. 60 tablet 11   amLODipine (NORVASC) 10 MG tablet TAKE 1 TABLET (10 MG TOTAL) BY MOUTH DAILY. 90 tablet 3   aspirin EC 81 MG tablet Take 81 mg by mouth daily. Swallow whole.     b complex vitamins capsule Take 1 capsule by mouth daily. 30 capsule 5   Blood Glucose Monitoring Suppl (ACCU-CHEK GUIDE) w/Device KIT Use to test blood sugar three times daily 1 kit 0   Continuous Blood Gluc Receiver (FREESTYLE LIBRE READER) DEVI Use to check blood sugar 3x daily. E11.42 1 each 0   Continuous Blood Gluc Sensor (FREESTYLE LIBRE SENSOR SYSTEM) MISC Use to check blood sugar 3 times daily. Change sensor every 2 weeks. 2 each 12   dexamethasone (DECADRON) 4 MG tablet 37m (5 tabs) Take the day after each dose of daratumumab. Take with breakfast 20 tablet 11   diclofenac Sodium (VOLTAREN) 1 % GEL Apply 4 g topically 4 (four) times daily. 150 g 5   donepezil (ARICEPT) 5 MG tablet TAKE 1 TABLET (5 MG TOTAL) BY MOUTH AT BEDTIME. FOR MEMORY ISSUES 30 tablet 5   ferrous sulfate 325 (65 FE) MG tablet Take 325 mg by mouth daily.     gabapentin (NEURONTIN) 300 MG capsule TAKE 1 CAPSULE BY MOUTH IN THE MORNING AND LUNCH AND 2 AT BEDTIME 120 capsule 6   hydrOXYzine (ATARAX) 25 MG tablet TAKE 1 TABLET (25 MG TOTAL) BY MOUTH 3 (THREE) TIMES DAILY AS NEEDED FOR ITCHING (RASH). 30 tablet 0   Insulin Pen Needle (BD PEN NEEDLE NANO U/F) 32G X 4 MM MISC USE AS DIRECTED TWICE DAILY WITH INSULIN 100 each 6   Miconazole Nitrate (ATHLETES FOOT POWDER SPRAY) 2 % AERP Spray between toes and under toes once daily. 130 g 4   Multiple  Vitamin (MULTIVITAMIN WITH MINERALS) TABS tablet Take 1 tablet by mouth daily. 60 tablet 2   ondansetron (ZOFRAN) 8 MG tablet Take 1 tablet (8 mg total) by mouth 2 (two) times daily as needed (Nausea or vomiting). 30 tablet 1   polyethylene glycol (MIRALAX) 17 g packet Take 17 g by mouth daily. 30 each 2   pomalidomide (POMALYST) 2 MG capsule Take 1 capsule (2 mg total) by mouth daily. 3 weeks on 1 week off 21 capsule 1   pomalidomide (POMALYST) 2 MG capsule Take 1 capsule (2 mg total) by mouth daily. 3 weeks on 1 week off 21 capsule 1   pravastatin (PRAVACHOL) 20 MG tablet TAKE 1  TABLET (20 MG TOTAL) BY MOUTH DAILY. 90 tablet 2   predniSONE (DELTASONE) 10 MG tablet Take 1 tablet (10 mg total) by mouth daily with breakfast for 21 days. 21 tablet 0   prochlorperazine (COMPAZINE) 10 MG tablet Take 1 tablet (10 mg total) by mouth every 6 (six) hours as needed (Nausea or vomiting). 30 tablet 1   tadalafil (CIALIS) 20 MG tablet Take 20 mg by mouth as needed.     tiZANidine (ZANAFLEX) 2 MG tablet Take 1 tablet (2 mg total) by mouth at bedtime as needed for muscle spasms. 30 tablet 1   vitamin B-12 (CYANOCOBALAMIN) 100 MCG tablet Take 100 mcg by mouth daily.     No current facility-administered medications on file prior to visit.    Allergies  Allergen Reactions   Other Other (See Comments)    Nicoderm CQ = Bad Dreams Nicotine gum = Bad Dreams     Social History   Socioeconomic History   Marital status: Single    Spouse name: Not on file   Number of children: Not on file   Years of education: Not on file   Highest education level: Not on file  Occupational History   Not on file  Tobacco Use   Smoking status: Every Day    Packs/day: 0.50    Years: 50.00    Total pack years: 25.00    Types: Cigarettes   Smokeless tobacco: Never   Tobacco comments:    1 ppwk-4-5 a day   Vaping Use   Vaping Use: Never used  Substance and Sexual Activity   Alcohol use: Yes    Alcohol/week: 1.0  standard drink of alcohol    Types: 1 Cans of beer per week    Comment: 1 every day-quit couple weeks ago   Drug use: No   Sexual activity: Not Currently  Other Topics Concern   Not on file  Social History Narrative   Not on file   Social Determinants of Health   Financial Resource Strain: Not on file  Food Insecurity: Not on file  Transportation Needs: Not on file  Physical Activity: Not on file  Stress: Not on file  Social Connections: Not on file  Intimate Partner Violence: Not on file    Family History  Problem Relation Age of Onset   Kidney disease Mother    Cancer Neg Hx    Colon cancer Neg Hx    Colon polyps Neg Hx    Esophageal cancer Neg Hx    Rectal cancer Neg Hx    Stomach cancer Neg Hx     Past Surgical History:  Procedure Laterality Date   CIRCUMCISION  1990's   PROSTATE BIOPSY N/A 12/21/2015   Procedure: BIOPSY TRANSRECTAL ULTRASONIC PROSTATE (TUBP);  Surgeon: Festus Aloe, MD;  Location: [facility];  Service: Urology;  Laterality: N/A;    ROS: Review of Systems Negative except as stated above  PHYSICAL EXAM: BP (!) 140/78   Pulse 75   Wt 169 lb 9.6 oz (76.9 kg)   SpO2 98%   BMI 26.56 kg/m   Wt Readings from Last 3 Encounters:  05/08/22 169 lb 9.6 oz (76.9 kg)  04/20/22 163 lb (73.9 kg)  04/05/22 162 lb 3.2 oz (73.6 kg)    Physical Exam   General appearance - alert, well appearing, pleasant elderly African-American male and in no distress.  Clothing is clean. Mental status - normal mood, behavior, speech, dress, motor activity, and thought processes Neck -  supple, no significant adenopathy Chest - clear to auscultation, no wheezes, rales or rhonchi, symmetric air entry Heart - normal rate, regular rhythm, normal S1, S2, no murmurs, rubs, clicks or gallops Musculoskeletal -ambulates unassisted.  Gait is slow with low foot to floor clearance Extremities -no lower extremity edema.     Latest Ref Rng & Units 04/20/2022    11:55 AM 04/05/2022   10:20 AM 03/23/2022   11:59 AM  CMP  Glucose 70 - 99 mg/dL 287  101  170   BUN 8 - 23 mg/dL _0 Creatinine 0.61 - 1.24 mg/dL 1.14  1.34  1.28   Sodium 135 - 145 mmol/L 136  140  140   Potassium 3.5 - 5.1 mmol/L 3.3  3.4  3.8   Chloride 98 - 111 mmol/L 104  107  110   CO2 22 - 32 mmol/L _1 Calcium 8.9 - 10.3 mg/dL 8.9  9.2  8.9   Total Protein 6.5 - 8.1 g/dL 6.4  6.6  5.8   Total Bilirubin 0.3 - 1.2 mg/dL 0.7  0.7  0.5   Alkaline Phos 38 - 126 U/L 94  82  84   AST 15 - 41 U/L _2 ALT 0 - 44 U/L _3 Lipid Panel     Component Value Date/Time   CHOL 184 04/25/2019 0939   TRIG 114 04/25/2019 0939   HDL 88 04/25/2019 0939   CHOLHDL 2.1 04/25/2019 0939   CHOLHDL 3.6 06/21/2016 1047   VLDL 50 (H) 06/21/2016 1047   LDLCALC 76 04/25/2019 0939    CBC    Component Value Date/Time   WBC 7.4 04/20/2022 1155   WBC 5.6 07/13/2021 0741   RBC 3.40 (L) 04/20/2022 1155   HGB 11.9 (L) 04/20/2022 1155   HGB 12.2 (L) 12/06/2017 0920   HGB 10.1 (L) 07/13/2017 0938   HCT 33.2 (L) 04/20/2022 1155   HCT 37.1 (L) 12/06/2017 0920   HCT 30.6 (L) 07/13/2017 0938   PLT 202 04/20/2022 1155   PLT 250 12/06/2017 0920   MCV 97.6 04/20/2022 1155   MCV 98 (H) 12/06/2017 0920   MCV 96.2 07/13/2017 0938   MCH 35.0 (H) 04/20/2022 1155   MCHC 35.8 04/20/2022 1155   RDW 14.0 04/20/2022 1155   RDW 14.1 12/06/2017 0920   RDW 14.4 07/13/2017 0938   LYMPHSABS 0.5 (L) 04/20/2022 1155   LYMPHSABS 0.8 (L) 07/13/2017 0938   MONOABS 0.4 04/20/2022 1155   MONOABS 0.4 07/13/2017 0938   EOSABS 0.1 04/20/2022 1155   EOSABS 0.5 07/13/2017 0938   EOSABS 0.8 (H) 11/21/2016 1416   BASOSABS 0.0 04/20/2022 1155   BASOSABS 0.0 07/13/2017 0938    ASSESSMENT AND PLAN:  1. Type 2 diabetes mellitus with peripheral neuropathy (HCC) Not at goal.  Likely partially due to him being on prednisone.  Advised him to increase Humalog 75/25 to 8 units twice a day.   Continue to monitor blood sugars.  Follow-up with clinical pharmacist in about 1 month with blood sugar readings. - POCT glucose (manual entry) - POCT glycosylated hemoglobin (Hb A1C) - glucose blood (ACCU-CHEK GUIDE) test strip; Use to test blood sugar 3 times daily.  Dispense: 100 each; Refill: 2 - Insulin Lispro Prot & Lispro (HUMALOG MIX 75/25 KWIKPEN) (75-25) 100 UNIT/ML Kwikpen; inject 8 units with breakfast and dinner.  Dispense: 15 mL; Refill: 11  2. Hypertension associated with type 2 diabetes mellitus ([place]) Not at goal.  Patient reports cough with lisinopril.  Changed to Diovan.  Continue amlodipine 10 mg daily.  After being on the Diovan for 1 week, I request that he returns to the lab to have chemistry done - valsartan (DIOVAN) 40 MG tablet; Take 1 tablet (40 mg total) by mouth daily. STOP LISINOPRIL  Dispense: 90 tablet; Refill: 3  3. Hyperlipidemia associated with type 2 diabetes mellitus (HCC) Continue Pravachol  4. Multiple myeloma in relapse ([place]) Followed by oncology  5. Tobacco dependence Strongly advised to quit.  Patient not ready to give a trial of quitting.  6. Need for shingles vaccine Will ask oncologist if okay for him to receive the Shingrix vaccine.  If it is okay, we will have him come as a nurse only visit to get the first shot.  7. Cough due to ACE inhibitor Change lisinopril to Diovan   Patient was given the opportunity to ask questions.  Patient verbalized understanding of the plan and was able to repeat key elements of the plan.   This documentation was completed using Radio producer.  Any transcriptional errors are unintentional.  Orders Placed This Encounter  Procedures   Basic Metabolic Panel   POCT glucose (manual entry)   POCT glycosylated hemoglobin (Hb A1C)     Requested Prescriptions   Signed Prescriptions Disp Refills   valsartan (DIOVAN) 40 MG tablet 90 tablet 3    Sig: Take 1 tablet (40 mg total) by mouth  daily. STOP LISINOPRIL   glucose blood (ACCU-CHEK GUIDE) test strip 100 each 2    Sig: Use to test blood sugar 3 times daily.   Insulin Lispro Prot & Lispro (HUMALOG MIX 75/25 KWIKPEN) (75-25) 100 UNIT/ML Kwikpen 15 mL 11    Sig: inject 8 units subcutaneously with breakfast and dinner.    Return in about 4 months (around 09/07/2022) for Appt with [facility] in 2 wks for BS check.  Karle Plumber, MD, FACP

## 2022-05-08 NOTE — Patient Instructions (Signed)
Stop lisinopril 2.5 mg because of cough.  We will put you on a different blood pressure medicine called Diovan 40 mg daily.  After you have been on this medication for 1 week, please return to the lab to have your kidney function rechecked.  Please make sure that you are drinking adequate fluids during the day.  Your A1c today is 7.9.  Increase most likely due to you being on prednisone.  Recommend increasing your insulin to 8 units twice a day with meals.  Please bring your blood sugar readings with you on your next visit in 1 month.  I recommend using your cane.

## 2022-05-08 NOTE — Telephone Encounter (Signed)
Labs were released in error, patient did not get labs drawn today. He will be back in one week  to get BMP drawn, patient is aware

## 2022-05-09 ENCOUNTER — Other Ambulatory Visit: Payer: Self-pay

## 2022-05-15 ENCOUNTER — Other Ambulatory Visit: Payer: Self-pay

## 2022-05-16 ENCOUNTER — Ambulatory Visit: Payer: Medicare Other | Attending: Internal Medicine

## 2022-05-16 ENCOUNTER — Other Ambulatory Visit: Payer: Self-pay

## 2022-05-16 DIAGNOSIS — E1159 Type 2 diabetes mellitus with other circulatory complications: Secondary | ICD-10-CM | POA: Diagnosis not present

## 2022-05-16 DIAGNOSIS — I152 Hypertension secondary to endocrine disorders: Secondary | ICD-10-CM | POA: Diagnosis not present

## 2022-05-16 MED FILL — Dexamethasone Sodium Phosphate Inj 100 MG/10ML: INTRAMUSCULAR | Qty: 2 | Status: AC

## 2022-05-17 ENCOUNTER — Inpatient Hospital Stay: Payer: Medicare Other | Attending: Hematology

## 2022-05-17 ENCOUNTER — Inpatient Hospital Stay (HOSPITAL_BASED_OUTPATIENT_CLINIC_OR_DEPARTMENT_OTHER): Payer: Medicare Other | Admitting: Hematology

## 2022-05-17 ENCOUNTER — Ambulatory Visit: Payer: Medicare Other | Admitting: Podiatry

## 2022-05-17 ENCOUNTER — Encounter: Payer: Self-pay | Admitting: Hematology

## 2022-05-17 ENCOUNTER — Inpatient Hospital Stay: Payer: Medicare Other

## 2022-05-17 DIAGNOSIS — Z5112 Encounter for antineoplastic immunotherapy: Secondary | ICD-10-CM | POA: Insufficient documentation

## 2022-05-17 DIAGNOSIS — Z79899 Other long term (current) drug therapy: Secondary | ICD-10-CM | POA: Insufficient documentation

## 2022-05-17 DIAGNOSIS — I129 Hypertensive chronic kidney disease with stage 1 through stage 4 chronic kidney disease, or unspecified chronic kidney disease: Secondary | ICD-10-CM | POA: Insufficient documentation

## 2022-05-17 DIAGNOSIS — E1142 Type 2 diabetes mellitus with diabetic polyneuropathy: Secondary | ICD-10-CM | POA: Diagnosis not present

## 2022-05-17 DIAGNOSIS — N183 Chronic kidney disease, stage 3 unspecified: Secondary | ICD-10-CM | POA: Insufficient documentation

## 2022-05-17 DIAGNOSIS — Z7189 Other specified counseling: Secondary | ICD-10-CM

## 2022-05-17 DIAGNOSIS — D721 Eosinophilia, unspecified: Secondary | ICD-10-CM | POA: Diagnosis not present

## 2022-05-17 DIAGNOSIS — C9002 Multiple myeloma in relapse: Secondary | ICD-10-CM | POA: Insufficient documentation

## 2022-05-17 DIAGNOSIS — E1122 Type 2 diabetes mellitus with diabetic chronic kidney disease: Secondary | ICD-10-CM | POA: Diagnosis not present

## 2022-05-17 DIAGNOSIS — D649 Anemia, unspecified: Secondary | ICD-10-CM | POA: Insufficient documentation

## 2022-05-17 LAB — CBC WITH DIFFERENTIAL (CANCER CENTER ONLY)
Abs Immature Granulocytes: 0.04 10*3/uL (ref 0.00–0.07)
Basophils Absolute: 0.1 10*3/uL (ref 0.0–0.1)
Basophils Relative: 1 %
Eosinophils Absolute: 0.1 10*3/uL (ref 0.0–0.5)
Eosinophils Relative: 1 %
HCT: 32.2 % — ABNORMAL LOW (ref 39.0–52.0)
Hemoglobin: 11 g/dL — ABNORMAL LOW (ref 13.0–17.0)
Immature Granulocytes: 0 %
Lymphocytes Relative: 6 %
Lymphs Abs: 0.6 10*3/uL — ABNORMAL LOW (ref 0.7–4.0)
MCH: 34.1 pg — ABNORMAL HIGH (ref 26.0–34.0)
MCHC: 34.2 g/dL (ref 30.0–36.0)
MCV: 99.7 fL (ref 80.0–100.0)
Monocytes Absolute: 0.9 10*3/uL (ref 0.1–1.0)
Monocytes Relative: 10 %
Neutro Abs: 7.6 10*3/uL (ref 1.7–7.7)
Neutrophils Relative %: 82 %
Platelet Count: 233 10*3/uL (ref 150–400)
RBC: 3.23 MIL/uL — ABNORMAL LOW (ref 4.22–5.81)
RDW: 13.5 % (ref 11.5–15.5)
WBC Count: 9.3 10*3/uL (ref 4.0–10.5)
nRBC: 0 % (ref 0.0–0.2)

## 2022-05-17 LAB — COMPREHENSIVE METABOLIC PANEL
ALT: 32 U/L (ref 0–44)
AST: 21 U/L (ref 15–41)
Albumin: 3.8 g/dL (ref 3.5–5.0)
Alkaline Phosphatase: 77 U/L (ref 38–126)
Anion gap: 7 (ref 5–15)
BUN: 23 mg/dL (ref 8–23)
CO2: 26 mmol/L (ref 22–32)
Calcium: 9.1 mg/dL (ref 8.9–10.3)
Chloride: 105 mmol/L (ref 98–111)
Creatinine, Ser: 1.35 mg/dL — ABNORMAL HIGH (ref 0.61–1.24)
GFR, Estimated: 55 mL/min — ABNORMAL LOW (ref >60)
Glucose, Bld: 165 mg/dL — ABNORMAL HIGH (ref 70–99)
Potassium: 4.5 mmol/L (ref 3.5–5.1)
Sodium: 138 mmol/L (ref 135–145)
Total Bilirubin: 0.5 mg/dL (ref 0.3–1.2)
Total Protein: 6.5 g/dL (ref 6.5–8.1)

## 2022-05-17 LAB — BASIC METABOLIC PANEL
BUN/Creatinine Ratio: 15 (ref 10–24)
BUN: 19 mg/dL (ref 8–27)
CO2: 20 mmol/L (ref 20–29)
Calcium: 9.3 mg/dL (ref 8.6–10.2)
Chloride: 103 mmol/L (ref 96–106)
Creatinine, Ser: 1.27 mg/dL (ref 0.76–1.27)
Glucose: 197 mg/dL — ABNORMAL HIGH (ref 70–99)
Potassium: 4.3 mmol/L (ref 3.5–5.2)
Sodium: 141 mmol/L (ref 134–144)
eGFR: 59 mL/min/{1.73_m2} — ABNORMAL LOW (ref >59)

## 2022-05-17 MED ORDER — SODIUM CHLORIDE 0.9 % IV SOLN: Freq: Once | INTRAVENOUS | Status: AC

## 2022-05-17 MED ORDER — FAMOTIDINE IN NACL 20-0.9 MG/50ML-% IV SOLN
20.0000 mg | Freq: Once | INTRAVENOUS | Status: AC
  Administered 2022-05-17: 20 mg via INTRAVENOUS

## 2022-05-17 MED ORDER — SODIUM CHLORIDE 0.9 % IV SOLN
20.0000 mg | Freq: Once | INTRAVENOUS | Status: AC
  Administered 2022-05-17: 20 mg via INTRAVENOUS
  Filled 2022-05-17: qty 20

## 2022-05-17 MED ORDER — DIPHENHYDRAMINE HCL 25 MG PO CAPS
50.0000 mg | ORAL_CAPSULE | Freq: Once | ORAL | Status: AC
  Administered 2022-05-17: 50 mg via ORAL

## 2022-05-17 MED ORDER — ACETAMINOPHEN 325 MG PO TABS
650.0000 mg | ORAL_TABLET | Freq: Once | ORAL | Status: AC
  Administered 2022-05-17: 650 mg via ORAL

## 2022-05-17 MED ORDER — SODIUM CHLORIDE 0.9 % IV SOLN
16.0000 mg/kg | Freq: Once | INTRAVENOUS | Status: AC
  Administered 2022-05-17: 1200 mg via INTRAVENOUS
  Filled 2022-05-17: qty 60

## 2022-05-17 NOTE — Patient Instructions (Signed)
[facility] ONCOLOGY  Discharge Instructions: Thank you for choosing [facility] to provide your oncology and hematology care.   If you have a lab appointment with the [facility], please go directly to the [facility] and check in at the registration area.   Wear comfortable clothing and clothing appropriate for easy access to any Portacath or PICC line.   We strive to give you quality time with your provider. You may need to reschedule your appointment if you arrive late (15 or more minutes).  Arriving late affects you and other patients whose appointments are after yours.  Also, if you miss three or more appointments without notifying the office, you may be dismissed from the clinic at the provider's discretion.      For prescription refill requests, have your pharmacy contact our office and allow 72 hours for refills to be completed.    Today you received the following chemotherapy and/or immunotherapy agents: daratumumab      To help prevent nausea and vomiting after your treatment, we encourage you to take your nausea medication as directed.  BELOW ARE SYMPTOMS THAT SHOULD BE REPORTED IMMEDIATELY: *FEVER GREATER THAN 100.4 F (38 C) OR HIGHER *CHILLS OR SWEATING *NAUSEA AND VOMITING THAT IS NOT CONTROLLED WITH YOUR NAUSEA MEDICATION *UNUSUAL SHORTNESS OF BREATH *UNUSUAL BRUISING OR BLEEDING *URINARY PROBLEMS (pain or burning when urinating, or frequent urination) *BOWEL PROBLEMS (unusual diarrhea, constipation, pain near the anus) TENDERNESS IN MOUTH AND THROAT WITH OR WITHOUT PRESENCE OF ULCERS (sore throat, sores in mouth, or a toothache) UNUSUAL RASH, SWELLING OR PAIN  UNUSUAL VAGINAL DISCHARGE OR ITCHING   Items with * indicate a potential emergency and should be followed up as soon as possible or go to the Emergency Department if any problems should occur.  Please show the CHEMOTHERAPY ALERT CARD or IMMUNOTHERAPY ALERT CARD at check-in  to the Emergency Department and triage nurse.  Should you have questions after your visit or need to cancel or reschedule your appointment, please contact [facility]  Dept: [phone]  and follow the prompts.  Office hours are 8:00 a.m. to 4:30 p.m. Monday - Friday. Please note that voicemails left after 4:00 p.m. may not be returned until the following business day.  We are closed weekends and major holidays. You have access to a nurse at all times for urgent questions. Please call the main number to the clinic Dept: [phone] and follow the prompts.   For any non-urgent questions, you may also contact your provider using MyChart. We now offer e-Visits for anyone 110 and older to request care online for non-urgent symptoms. For details visit [email or web address].   Also download the MyChart app! Go to the app store, search "MyChart", open the app, select [facility], and log in with your MyChart username and password.  Masks are optional in the cancer centers. If you would like for your care team to wear a mask while they are taking care of you, please let them know. You may have one support person who is at least 74 years old accompany you for your appointments.

## 2022-05-17 NOTE — Progress Notes (Addendum)
HEMATOLOGY/ONCOLOGY CLINIC NOTE  Date of Service: 05/17/22    Patient Care Team: [name], MD as PCP - General (Internal Medicine)  CHIEF COMPLAINTS/PURPOSE OF CONSULTATION:  Follow-up for continued evaluation and management of relapsed multiple myeloma  Oncologic History:  76 y.o. male with diagnosis of IgA lambda active multiple myeloma, ISS Stage I. Active disease diagnosed based on presence of anemia, kidney insufficiency, and paraproteinemia with significant predominance of lambda light chains as well as significant elevation of IgA. Bone marrow biopsy confirmed presence of atypical monoclonal plasma cell process in the bone marrow comprising at least 7% of the cellularity. Based on the findings, patient was started on treatment with lenalidomide, bortezomib, and low-dose dexamethasone based on the anticipated tolerance by the patient. Lenalidomide was started at 10 mg daily based on creatinine clearance of 42, dexamethasone dose was reduced to 20 mg weekly based on patient's age. Treatment was complicated by rapid development of cutaneous rash attributed to lenalidomide, inaddition to decreased renal function and now patient has persistent severe anemia. Side-effects were attributed to Lenalidomide and lenalidomide was discontinued with subsequent recovery.  Patient has been receiving bortezomib weekly with low-dose dexamethasone in 4-day cycles.   Patient has completed induction systemic therapy for his disease with repeat bone marrow biopsy confirming complete response including no evidence of minimal residual disease by cytogenetics or FISH.  HISTORY OF PRESENTING ILLNESS:  Please see previous note for details of initial presentation.  [name] is a 76 y.o. male is here for continued evaluation and management of his relapsed multiple myeloma. He is here today for his chemotherapy.   Patient was last seen by me on 04/05/2022. He was  tolerating Daratumumab without any issues. He had started Pomalidomide and had grade 1 macular rash.   He reports he has been doing well without any new medical concerns since our last visit. He denies any skin rash or any other issues from Pomalidomide. He complains of drowsiness when he first started Pomalidomide. He does not take hydroxyzine for his skin rash.   He notes that his neuropathy is the same since our last visit. He is currently taking Gabapentin 300 mg and it is helping with his symptoms.   He denies infection, fever, chill, bone pain, back pain, loss of appetite, low energy, and abdominal pain.   He reports he wants to travel home to [facility], [place] in December and was wondering if he can hold chemotherapy for it.    MEDICAL HISTORY:  Past Medical History:  Diagnosis Date   Anemia    Arthritis    shoulders,feet    Cataract    per eye dr -has appt 5-11   CKD (chronic kidney disease), stage III (HCC)    Elevated PSA    History of ketoacidosis    03-29-2015    History of sepsis    07-25-2013  non-compliant w/ medication   Hyperlipidemia    Hypertension    Nocturia    Peripheral neuropathy    Prostate cancer ([place])    Type 2 diabetes mellitus with insulin therapy [facility])     SURGICAL HISTORY: Past Surgical History:  Procedure Laterality Date   CIRCUMCISION  1990's   PROSTATE BIOPSY N/A 12/21/2015   Procedure: BIOPSY TRANSRECTAL ULTRASONIC PROSTATE (TUBP);  Surgeon: Festus Aloe, MD;  Location: [facility];  Service: Urology;  Laterality: N/A;    SOCIAL HISTORY: Social History   Socioeconomic History   Marital  status: Single    Spouse name: Not on file   Number of children: Not on file   Years of education: Not on file   Highest education level: Not on file  Occupational History   Not on file  Tobacco Use   Smoking status: Every Day    Packs/day: 0.50    Years: 50.00    Total pack years: 25.00    Types: Cigarettes   Smokeless tobacco:  Never   Tobacco comments:    1 ppwk-4-5 a day   Vaping Use   Vaping Use: Never used  Substance and Sexual Activity   Alcohol use: Yes    Alcohol/week: 1.0 standard drink of alcohol    Types: 1 Cans of beer per week    Comment: 1 every day-quit couple weeks ago   Drug use: No   Sexual activity: Not Currently  Other Topics Concern   Not on file  Social History Narrative   Not on file   Social Determinants of Health   Financial Resource Strain: Not on file  Food Insecurity: Not on file  Transportation Needs: Not on file  Physical Activity: Not on file  Stress: Not on file  Social Connections: Not on file  Intimate Partner Violence: Not on file    FAMILY HISTORY: Family History  Problem Relation Age of Onset   Kidney disease Mother    Cancer Neg Hx    Colon cancer Neg Hx    Colon polyps Neg Hx    Esophageal cancer Neg Hx    Rectal cancer Neg Hx    Stomach cancer Neg Hx     ALLERGIES:  is allergic to other and lisinopril.  MEDICATIONS:  Current Outpatient Medications  Medication Sig Dispense Refill   Accu-Chek Softclix Lancets lancets Use to test blood sugar 3 times daily. 100 each 3   acyclovir (ZOVIRAX) 400 MG tablet Take 1 tablet (400 mg total) by mouth 2 (two) times daily. 60 tablet 11   amLODipine (NORVASC) 10 MG tablet TAKE 1 TABLET (10 MG TOTAL) BY MOUTH DAILY. 90 tablet 3   aspirin EC 81 MG tablet Take 81 mg by mouth daily. Swallow whole.     b complex vitamins capsule Take 1 capsule by mouth daily. 30 capsule 5   Blood Glucose Monitoring Suppl (ACCU-CHEK GUIDE) w/Device KIT Use to test blood sugar three times daily 1 kit 0   Continuous Blood Gluc Receiver (FREESTYLE LIBRE READER) DEVI Use to check blood sugar 3x daily. E11.42 1 each 0   Continuous Blood Gluc Sensor (FREESTYLE LIBRE SENSOR SYSTEM) MISC Use to check blood sugar 3 times daily. Change sensor every 2 weeks. 2 each 12   dexamethasone (DECADRON) 4 MG tablet 56m (5 tabs) Take the day after each dose  of daratumumab. Take with breakfast 20 tablet 11   diclofenac Sodium (VOLTAREN) 1 % GEL Apply 4 g topically 4 (four) times daily. 150 g 5   donepezil (ARICEPT) 5 MG tablet TAKE 1 TABLET (5 MG TOTAL) BY MOUTH AT BEDTIME. FOR MEMORY ISSUES 30 tablet 5   ferrous sulfate 325 (65 FE) MG tablet Take 325 mg by mouth daily.     gabapentin (NEURONTIN) 300 MG capsule TAKE 1 CAPSULE BY MOUTH IN THE MORNING AND LUNCH AND 2 AT BEDTIME 120 capsule 6   glucose blood (ACCU-CHEK GUIDE) test strip Use to test blood sugar 3 times daily. 100 each 2   hydrOXYzine (ATARAX) 25 MG tablet TAKE 1 TABLET (25 MG TOTAL) BY  MOUTH 3 (THREE) TIMES DAILY AS NEEDED FOR ITCHING (RASH). 30 tablet 0   Insulin Lispro Prot & Lispro (HUMALOG MIX 75/25 KWIKPEN) (75-25) 100 UNIT/ML Kwikpen inject 8 units subcutaneously with breakfast and dinner. 15 mL 11   Insulin Pen Needle (BD PEN NEEDLE NANO U/F) 32G X 4 MM MISC USE AS DIRECTED TWICE DAILY WITH INSULIN 100 each 6   Miconazole Nitrate (ATHLETES FOOT POWDER SPRAY) 2 % AERP Spray between toes and under toes once daily. 130 g 4   Multiple Vitamin (MULTIVITAMIN WITH MINERALS) TABS tablet Take 1 tablet by mouth daily. 60 tablet 2   ondansetron (ZOFRAN) 8 MG tablet Take 1 tablet (8 mg total) by mouth 2 (two) times daily as needed (Nausea or vomiting). 30 tablet 1   polyethylene glycol (MIRALAX) 17 g packet Take 17 g by mouth daily. 30 each 2   pomalidomide (POMALYST) 2 MG capsule Take 1 capsule (2 mg total) by mouth daily. 3 weeks on 1 week off 21 capsule 1   pomalidomide (POMALYST) 2 MG capsule Take 1 capsule (2 mg total) by mouth daily. 3 weeks on 1 week off 21 capsule 1   pravastatin (PRAVACHOL) 20 MG tablet TAKE 1 TABLET (20 MG TOTAL) BY MOUTH DAILY. 90 tablet 2   predniSONE (DELTASONE) 10 MG tablet Take 1 tablet (10 mg total) by mouth daily with breakfast for 21 days. 21 tablet 0   prochlorperazine (COMPAZINE) 10 MG tablet Take 1 tablet (10 mg total) by mouth every 6 (six) hours as needed  (Nausea or vomiting). 30 tablet 1   tadalafil (CIALIS) 20 MG tablet Take 20 mg by mouth as needed.     tiZANidine (ZANAFLEX) 2 MG tablet Take 1 tablet (2 mg total) by mouth at bedtime as needed for muscle spasms. 30 tablet 1   valsartan (DIOVAN) 40 MG tablet Take 1 tablet (40 mg total) by mouth daily. STOP LISINOPRIL 90 tablet 3   vitamin B-12 (CYANOCOBALAMIN) 100 MCG tablet Take 100 mcg by mouth daily.     No current facility-administered medications for this visit.    ROS .10 Point review of Systems was done is negative except as noted above.  PHYSICAL EXAMINATION: .BP (!) 146/66 (BP Location: Left Arm, Patient Position: Sitting)   Pulse 84   Temp (!) 97.3 F (36.3 C) (Temporal)   Resp 18   Wt 169 lb 11.2 oz (77 kg)   SpO2 96%   BMI 26.58 kg/m  . GENERAL:alert, in no acute distress and comfortable SKIN: no acute rashes, no significant lesions EYES: conjunctiva are pink and non-injected, sclera anicteric OROPHARYNX: MMM, no exudates, no oropharyngeal erythema or ulceration NECK: supple, no JVD LYMPH:  no palpable lymphadenopathy in the cervical, axillary or inguinal regions LUNGS: clear to auscultation b/l with normal respiratory effort HEART: regular rate & rhythm ABDOMEN:  normoactive bowel sounds , non tender, not distended. Extremity: no pedal edema PSYCH: alert & oriented x 3 with fluent speech NEURO: no focal motor/sensory deficits   LABORATORY DATA:  I have reviewed the data as listed  .    Latest Ref Rng & Units 05/17/2022   10:38 AM 04/20/2022   11:55 AM 04/05/2022   10:20 AM  CBC  WBC 4.0 - 10.5 K/uL 9.3  7.4  5.9   Hemoglobin 13.0 - 17.0 g/dL 11.0  11.9  12.1   Hematocrit 39.0 - 52.0 % 32.2  33.2  34.8   Platelets 150 - 400 K/uL 233  202  210  Latest Ref Rng & Units 05/17/2022   10:38 AM 05/16/2022    9:40 AM 04/20/2022   11:55 AM  CMP  Glucose 70 - 99 mg/dL 165  197  287   BUN 8 - 23 mg/dL _0 Creatinine 0.61 - 1.24 mg/dL 1.35  1.27   1.14   Sodium 135 - 145 mmol/L 138  141  136   Potassium 3.5 - 5.1 mmol/L 4.5  4.3  3.3   Chloride 98 - 111 mmol/L 105  103  104   CO2 22 - 32 mmol/L _1 Calcium 8.9 - 10.3 mg/dL 9.1  9.3  8.9   Total Protein 6.5 - 8.1 g/dL 6.5   6.4   Total Bilirubin 0.3 - 1.2 mg/dL 0.5   0.7   Alkaline Phos 38 - 126 U/L 77   94   AST 15 - 41 U/L 21   17   ALT 0 - 44 U/L 32   21      08/29/17 BM Bx:    08/29/17 Cytogenetics:   03/13/17 Cytogenetics:        PATHOLOGY Surgical Pathology  CASE: [id number]  PATIENT: Kenichi Mazzola  Bone Marrow Report      Clinical History: Multiple myeloma in relapse ([place]) ,([facility])      DIAGNOSIS:   BONE MARROW, ASPIRATE, CLOT, CORE:  -Hypercellular bone marrow for age with plasma cell neoplasm  -See comment   PERIPHERAL BLOOD:  -Mild normocytic-normochromic anemia  -Eosinophilia   COMMENT:   The bone marrow is hypercellular for age with increased number of plasma  cells representing 8% of all cells in the aspirate associated with  numerous small clusters in the clot and biopsy sections. The plasma  cells display lambda light chain restriction consistent with plasma cell  neoplasm.  The background shows trilineage hematopoiesis with generally  nonspecific myeloid changes, likely secondary in nature in this setting.  Correlation with cytogenetic and FISH studies is recommended.      RADIOGRAPHIC STUDIES: I have personally reviewed the radiological images as listed and agreed with the findings in the report. No results found.  ASSESSMENT & PLAN:   76 y.o. male with   1.  Relapsed IgA Lambda Multiple Myeloma ISS Stage I    Active disease was previously diagnosed based on presence of anemia, kidney insufficiency, and paraproteinemia with significant predominance of lambda light chains as well as significant elevation of IgA. Patient is status post patient was treated with induction bortezomib Revlimid dexamethasone.  Had  issues with skin rashes with Revlimid and this was discontinued.  Completed bortezomib dexamethasone induction and achieved complete remission. Was on maintenance Velcade 2 weeks which was then switched to maintenance Ninlaro. Patient had issues with worsening neuropathy which was a combination of Velcade and his diabetes.  Ninlaro was discontinued as well after maintenance of more than 2 years.  2.  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 Bone marrow biopsy shows 8% lambda restricted plasma cells PET CT scan with no hypermetabolic osseous lesions  3.  Hypertension 4.  Diabetes type 2 5 .chronic kidney disease due to hypertension and diabetes. 6.  Peripheral neuropathy related to diabetes and previous myeloma treatments.  Plan: -Discussed labs from today, 05/17/2022. Labs showed stable CBC and CMP. Myeloma panel with noM spike IFE +ve for IgA monoclonal protein. -Patient denies skin rash from  Pomalidomide. Will stop Prednisone.  -No new focal symptoms related to multiple myeloma. -No new toxicities related to his daratumumab OR Pomalidomide. -We will continue daratumumab with same supportive medications.  Followup  PER INTEGRATED SCHEDULING  The total time spent in the appointment was 20 minutes* .  All of the patient's questions were answered with apparent satisfaction. The patient knows to call the clinic with any problems, questions or concerns.   Zettie Cooley, am acting as a scribe for Sullivan Lone, MD.  Sullivan Lone MD [place] AAHIVMS [facility] Hematology/Oncology Physician [facility]  .*Total Encounter Time as defined by the Centers for Medicare and Medicaid Services includes, in addition to the face-to-face time of a patient visit (documented in the note above) non-face-to-face time: obtaining and reviewing outside history, ordering and reviewing medications, tests or procedures,  care coordination (communications with other health care professionals or caregivers) and documentation in the medical record.

## 2022-05-22 LAB — MULTIPLE MYELOMA PANEL, SERUM
Albumin SerPl Elph-Mcnc: 3.7 g/dL (ref 2.9–4.4)
Albumin/Glob SerPl: 1.7 (ref 0.7–1.7)
Alpha 1: 0.3 g/dL (ref 0.0–0.4)
Alpha2 Glob SerPl Elph-Mcnc: 0.8 g/dL (ref 0.4–1.0)
B-Globulin SerPl Elph-Mcnc: 0.8 g/dL (ref 0.7–1.3)
Gamma Glob SerPl Elph-Mcnc: 0.4 g/dL (ref 0.4–1.8)
Globulin, Total: 2.2 g/dL (ref 2.2–3.9)
IgA: 121 mg/dL (ref 61–437)
IgG (Immunoglobin G), Serum: 436 mg/dL — ABNORMAL LOW (ref 603–1613)
IgM (Immunoglobulin M), Srm: 32 mg/dL (ref 15–143)
Total Protein ELP: 5.9 g/dL — ABNORMAL LOW (ref 6.0–8.5)

## 2022-05-23 ENCOUNTER — Encounter: Payer: Self-pay | Admitting: Hematology

## 2022-05-24 ENCOUNTER — Other Ambulatory Visit: Payer: Self-pay

## 2022-05-24 DIAGNOSIS — C9002 Multiple myeloma in relapse: Secondary | ICD-10-CM

## 2022-05-24 MED ORDER — POMALIDOMIDE 2 MG PO CAPS: 2.0000 mg | ORAL_CAPSULE | Freq: Every day | ORAL | 1 refills | Status: DC

## 2022-05-25 ENCOUNTER — Other Ambulatory Visit: Payer: Self-pay | Admitting: Hematology

## 2022-05-25 ENCOUNTER — Other Ambulatory Visit: Payer: Self-pay

## 2022-05-25 DIAGNOSIS — C9002 Multiple myeloma in relapse: Secondary | ICD-10-CM

## 2022-05-25 MED ORDER — PREDNISONE 10 MG PO TABS
10.0000 mg | ORAL_TABLET | Freq: Every day | ORAL | 0 refills | Status: DC
  Filled 2022-05-25: qty 21, 21d supply, fill #0

## 2022-05-26 ENCOUNTER — Ambulatory Visit (INDEPENDENT_AMBULATORY_CARE_PROVIDER_SITE_OTHER): Payer: Medicare Other | Admitting: Podiatry

## 2022-05-26 ENCOUNTER — Encounter: Payer: Self-pay | Admitting: Podiatry

## 2022-05-26 DIAGNOSIS — Q828 Other specified congenital malformations of skin: Secondary | ICD-10-CM | POA: Diagnosis not present

## 2022-05-26 DIAGNOSIS — B351 Tinea unguium: Secondary | ICD-10-CM

## 2022-05-26 DIAGNOSIS — E1142 Type 2 diabetes mellitus with diabetic polyneuropathy: Secondary | ICD-10-CM

## 2022-05-26 DIAGNOSIS — M79675 Pain in left toe(s): Secondary | ICD-10-CM

## 2022-05-26 DIAGNOSIS — M79674 Pain in right toe(s): Secondary | ICD-10-CM

## 2022-05-26 DIAGNOSIS — L84 Corns and callosities: Secondary | ICD-10-CM | POA: Diagnosis not present

## 2022-05-27 ENCOUNTER — Other Ambulatory Visit: Payer: Self-pay

## 2022-05-29 ENCOUNTER — Other Ambulatory Visit: Payer: Self-pay

## 2022-05-29 MED FILL — Dexamethasone Sodium Phosphate Inj 100 MG/10ML: INTRAMUSCULAR | Qty: 2 | Status: AC

## 2022-05-30 ENCOUNTER — Inpatient Hospital Stay: Payer: Medicare Other

## 2022-05-30 ENCOUNTER — Inpatient Hospital Stay (HOSPITAL_BASED_OUTPATIENT_CLINIC_OR_DEPARTMENT_OTHER): Payer: Medicare Other | Admitting: Hematology

## 2022-05-30 ENCOUNTER — Other Ambulatory Visit: Payer: Self-pay

## 2022-05-30 VITALS — BP 131/71 | HR 73 | Temp 98.0°F | Resp 17

## 2022-05-30 VITALS — BP 138/63 | HR 80 | Temp 97.7°F | Resp 15 | Wt 169.4 lb

## 2022-05-30 DIAGNOSIS — Z5112 Encounter for antineoplastic immunotherapy: Secondary | ICD-10-CM | POA: Diagnosis not present

## 2022-05-30 DIAGNOSIS — Z7189 Other specified counseling: Secondary | ICD-10-CM

## 2022-05-30 DIAGNOSIS — C9002 Multiple myeloma in relapse: Secondary | ICD-10-CM

## 2022-05-30 DIAGNOSIS — D649 Anemia, unspecified: Secondary | ICD-10-CM | POA: Diagnosis not present

## 2022-05-30 DIAGNOSIS — E1122 Type 2 diabetes mellitus with diabetic chronic kidney disease: Secondary | ICD-10-CM | POA: Diagnosis not present

## 2022-05-30 DIAGNOSIS — E1142 Type 2 diabetes mellitus with diabetic polyneuropathy: Secondary | ICD-10-CM | POA: Diagnosis not present

## 2022-05-30 DIAGNOSIS — I129 Hypertensive chronic kidney disease with stage 1 through stage 4 chronic kidney disease, or unspecified chronic kidney disease: Secondary | ICD-10-CM | POA: Diagnosis not present

## 2022-05-30 DIAGNOSIS — D721 Eosinophilia, unspecified: Secondary | ICD-10-CM | POA: Diagnosis not present

## 2022-05-30 DIAGNOSIS — N183 Chronic kidney disease, stage 3 unspecified: Secondary | ICD-10-CM | POA: Diagnosis not present

## 2022-05-30 DIAGNOSIS — Z79899 Other long term (current) drug therapy: Secondary | ICD-10-CM | POA: Diagnosis not present

## 2022-05-30 LAB — COMPREHENSIVE METABOLIC PANEL
ALT: 26 U/L (ref 0–44)
AST: 22 U/L (ref 15–41)
Albumin: 4.1 g/dL (ref 3.5–5.0)
Alkaline Phosphatase: 111 U/L (ref 38–126)
Anion gap: 9 (ref 5–15)
BUN: 20 mg/dL (ref 8–23)
CO2: 20 mmol/L — ABNORMAL LOW (ref 22–32)
Calcium: 9.3 mg/dL (ref 8.9–10.3)
Chloride: 108 mmol/L (ref 98–111)
Creatinine, Ser: 1.36 mg/dL — ABNORMAL HIGH (ref 0.61–1.24)
GFR, Estimated: 55 mL/min — ABNORMAL LOW (ref >60)
Glucose, Bld: 143 mg/dL — ABNORMAL HIGH (ref 70–99)
Potassium: 3.9 mmol/L (ref 3.5–5.1)
Sodium: 137 mmol/L (ref 135–145)
Total Bilirubin: 0.6 mg/dL (ref 0.3–1.2)
Total Protein: 6.5 g/dL (ref 6.5–8.1)

## 2022-05-30 LAB — CBC WITH DIFFERENTIAL (CANCER CENTER ONLY)
Abs Immature Granulocytes: 0.02 10*3/uL (ref 0.00–0.07)
Basophils Absolute: 0.1 10*3/uL (ref 0.0–0.1)
Basophils Relative: 2 %
Eosinophils Absolute: 0.1 10*3/uL (ref 0.0–0.5)
Eosinophils Relative: 2 %
HCT: 32.7 % — ABNORMAL LOW (ref 39.0–52.0)
Hemoglobin: 11.3 g/dL — ABNORMAL LOW (ref 13.0–17.0)
Immature Granulocytes: 1 %
Lymphocytes Relative: 19 %
Lymphs Abs: 0.8 10*3/uL (ref 0.7–4.0)
MCH: 34.7 pg — ABNORMAL HIGH (ref 26.0–34.0)
MCHC: 34.6 g/dL (ref 30.0–36.0)
MCV: 100.3 fL — ABNORMAL HIGH (ref 80.0–100.0)
Monocytes Absolute: 0.3 10*3/uL (ref 0.1–1.0)
Monocytes Relative: 7 %
Neutro Abs: 2.9 10*3/uL (ref 1.7–7.7)
Neutrophils Relative %: 69 %
Platelet Count: 177 10*3/uL (ref 150–400)
RBC: 3.26 MIL/uL — ABNORMAL LOW (ref 4.22–5.81)
RDW: 13.5 % (ref 11.5–15.5)
WBC Count: 4.2 10*3/uL (ref 4.0–10.5)
nRBC: 0 % (ref 0.0–0.2)

## 2022-05-30 MED ORDER — SODIUM CHLORIDE 0.9 % IV SOLN
16.0000 mg/kg | Freq: Once | INTRAVENOUS | Status: AC
  Administered 2022-05-30: 1200 mg via INTRAVENOUS
  Filled 2022-05-30: qty 60

## 2022-05-30 MED ORDER — FAMOTIDINE IN NACL 20-0.9 MG/50ML-% IV SOLN
20.0000 mg | Freq: Once | INTRAVENOUS | Status: AC
  Administered 2022-05-30: 20 mg via INTRAVENOUS
  Filled 2022-05-30: qty 50

## 2022-05-30 MED ORDER — HEPARIN SOD (PORK) LOCK FLUSH 100 UNIT/ML IV SOLN: 500.0000 [IU] | Freq: Once | INTRAVENOUS | Status: DC | PRN

## 2022-05-30 MED ORDER — DIPHENHYDRAMINE HCL 25 MG PO CAPS
50.0000 mg | ORAL_CAPSULE | Freq: Once | ORAL | Status: AC
  Administered 2022-05-30: 50 mg via ORAL
  Filled 2022-05-30: qty 2

## 2022-05-30 MED ORDER — SODIUM CHLORIDE 0.9 % IV SOLN
20.0000 mg | Freq: Once | INTRAVENOUS | Status: AC
  Administered 2022-05-30: 20 mg via INTRAVENOUS
  Filled 2022-05-30: qty 20

## 2022-05-30 MED ORDER — SODIUM CHLORIDE 0.9% FLUSH: 10.0000 mL | INTRAVENOUS | Status: DC | PRN

## 2022-05-30 MED ORDER — ACETAMINOPHEN 325 MG PO TABS
650.0000 mg | ORAL_TABLET | Freq: Once | ORAL | Status: AC
  Administered 2022-05-30: 650 mg via ORAL
  Filled 2022-05-30: qty 2

## 2022-05-30 MED ORDER — SODIUM CHLORIDE 0.9 % IV SOLN: Freq: Once | INTRAVENOUS | Status: AC

## 2022-05-30 NOTE — Progress Notes (Signed)
HEMATOLOGY/ONCOLOGY CLINIC NOTE  Date of Service: 05/30/22    Patient Care Team: [name], MD as PCP - General (Internal Medicine)  CHIEF COMPLAINTS/PURPOSE OF CONSULTATION:  Follow-up for continued evaluation and management of relapsed multiple myeloma  Oncologic History:  76 y.o. male with diagnosis of IgA lambda active multiple myeloma, ISS Stage I. Active disease diagnosed based on presence of anemia, kidney insufficiency, and paraproteinemia with significant predominance of lambda light chains as well as significant elevation of IgA. Bone marrow biopsy confirmed presence of atypical monoclonal plasma cell process in the bone marrow comprising at least 7% of the cellularity. Based on the findings, patient was started on treatment with lenalidomide, bortezomib, and low-dose dexamethasone based on the anticipated tolerance by the patient. Lenalidomide was started at 10 mg daily based on creatinine clearance of 42, dexamethasone dose was reduced to 20 mg weekly based on patient's age. Treatment was complicated by rapid development of cutaneous rash attributed to lenalidomide, inaddition to decreased renal function and now patient has persistent severe anemia. Side-effects were attributed to Lenalidomide and lenalidomide was discontinued with subsequent recovery.  Patient has been receiving bortezomib weekly with low-dose dexamethasone in 4-day cycles.   Patient has completed induction systemic therapy for his disease with repeat bone marrow biopsy confirming complete response including no evidence of minimal residual disease by cytogenetics or FISH.  HISTORY OF PRESENTING ILLNESS:  Please see previous note for details of initial presentation.  [name] is a 76 y.o. male is here for continued evaluation and management of his relapsed multiple myeloma. He is here today for cycle 1 day 15 of Daratumumab.   Patient was last seen by me on 05/17/2022  and was doing well without any concerns. He was tolerating Daratumumab and Pomalidomide without any issues.   Patient reports he has been doing well without any new medical concerns since our last visit. He is tolerating his chemotherapy without any major side effects. However, he complains of occasional grade 1 diarrhea.   He reports he occasionally feels dizzy while taking Pomalidomide. He notes he has not been drinking water as much as he needs to.   Patient complains of occasional insomnia, but it is not related to chemotherapy or other medications.   He denies fever, chills, back pain, abdomina pain, and leg swelling during today's visit.   He has received his influenza vaccine, COVID-19 Booster, RSV vaccine, Shingles vaccines, and Pneumonia vaccine.   MEDICAL HISTORY:  Past Medical History:  Diagnosis Date   Anemia    Arthritis    shoulders,feet    Cataract    per eye dr -has appt 5-11   CKD (chronic kidney disease), stage III (HCC)    Elevated PSA    History of ketoacidosis    03-29-2015    History of sepsis    07-25-2013  non-compliant w/ medication   Hyperlipidemia    Hypertension    Nocturia    Peripheral neuropathy    Prostate cancer ([place])    Type 2 diabetes mellitus with insulin therapy [facility])     SURGICAL HISTORY: Past Surgical History:  Procedure Laterality Date   CIRCUMCISION  1990's   PROSTATE BIOPSY N/A 12/21/2015   Procedure: BIOPSY TRANSRECTAL ULTRASONIC PROSTATE (TUBP);  Surgeon: Festus Aloe, MD;  Location: [facility];  Service: Urology;  Laterality: N/A;    SOCIAL HISTORY: Social History   Socioeconomic History   Marital status: Single    Spouse  name: Not on file   Number of children: Not on file   Years of education: Not on file   Highest education level: Not on file  Occupational History   Not on file  Tobacco Use   Smoking status: Every Day    Packs/day: 0.50    Years: 50.00    Total pack years: 25.00    Types:  Cigarettes   Smokeless tobacco: Never   Tobacco comments:    1 ppwk-4-5 a day   Vaping Use   Vaping Use: Never used  Substance and Sexual Activity   Alcohol use: Yes    Alcohol/week: 1.0 standard drink of alcohol    Types: 1 Cans of beer per week    Comment: 1 every day-quit couple weeks ago   Drug use: No   Sexual activity: Not Currently  Other Topics Concern   Not on file  Social History Narrative   Not on file   Social Determinants of Health   Financial Resource Strain: Not on file  Food Insecurity: Not on file  Transportation Needs: Not on file  Physical Activity: Not on file  Stress: Not on file  Social Connections: Not on file  Intimate Partner Violence: Not on file    FAMILY HISTORY: Family History  Problem Relation Age of Onset   Kidney disease Mother    Cancer Neg Hx    Colon cancer Neg Hx    Colon polyps Neg Hx    Esophageal cancer Neg Hx    Rectal cancer Neg Hx    Stomach cancer Neg Hx     ALLERGIES:  is allergic to other and lisinopril.  MEDICATIONS:  Current Outpatient Medications  Medication Sig Dispense Refill   Accu-Chek Softclix Lancets lancets Use to test blood sugar 3 times daily. 100 each 3   acyclovir (ZOVIRAX) 400 MG tablet Take 1 tablet (400 mg total) by mouth 2 (two) times daily. 60 tablet 11   amLODipine (NORVASC) 10 MG tablet TAKE 1 TABLET (10 MG TOTAL) BY MOUTH DAILY. 90 tablet 3   aspirin EC 81 MG tablet Take 81 mg by mouth daily. Swallow whole.     b complex vitamins capsule Take 1 capsule by mouth daily. 30 capsule 5   Blood Glucose Monitoring Suppl (ACCU-CHEK GUIDE) w/Device KIT Use to test blood sugar three times daily 1 kit 0   Continuous Blood Gluc Receiver (FREESTYLE LIBRE READER) DEVI Use to check blood sugar 3x daily. E11.42 1 each 0   Continuous Blood Gluc Sensor (FREESTYLE LIBRE SENSOR SYSTEM) MISC Use to check blood sugar 3 times daily. Change sensor every 2 weeks. 2 each 12   dexamethasone (DECADRON) 4 MG tablet 33m (5  tabs) Take the day after each dose of daratumumab. Take with breakfast 20 tablet 11   diclofenac Sodium (VOLTAREN) 1 % GEL Apply 4 g topically 4 (four) times daily. 150 g 5   donepezil (ARICEPT) 5 MG tablet TAKE 1 TABLET (5 MG TOTAL) BY MOUTH AT BEDTIME. FOR MEMORY ISSUES 30 tablet 5   ferrous sulfate 325 (65 FE) MG tablet Take 325 mg by mouth daily.     gabapentin (NEURONTIN) 300 MG capsule TAKE 1 CAPSULE BY MOUTH IN THE MORNING AND LUNCH AND 2 AT BEDTIME 120 capsule 6   glucose blood (ACCU-CHEK GUIDE) test strip Use to test blood sugar 3 times daily. 100 each 2   hydrOXYzine (ATARAX) 25 MG tablet TAKE 1 TABLET (25 MG TOTAL) BY MOUTH 3 (THREE) TIMES DAILY AS  NEEDED FOR ITCHING (RASH). 30 tablet 0   Insulin Lispro Prot & Lispro (HUMALOG MIX 75/25 KWIKPEN) (75-25) 100 UNIT/ML Kwikpen inject 8 units subcutaneously with breakfast and dinner. 15 mL 11   Insulin Pen Needle (BD PEN NEEDLE NANO U/F) 32G X 4 MM MISC USE AS DIRECTED TWICE DAILY WITH INSULIN 100 each 6   Miconazole Nitrate (ATHLETES FOOT POWDER SPRAY) 2 % AERP Spray between toes and under toes once daily. 130 g 4   Multiple Vitamin (MULTIVITAMIN WITH MINERALS) TABS tablet Take 1 tablet by mouth daily. 60 tablet 2   ondansetron (ZOFRAN) 8 MG tablet Take 1 tablet (8 mg total) by mouth 2 (two) times daily as needed (Nausea or vomiting). 30 tablet 1   polyethylene glycol (MIRALAX) 17 g packet Take 17 g by mouth daily. 30 each 2   pomalidomide (POMALYST) 2 MG capsule Take 1 capsule (2 mg total) by mouth daily. 3 weeks on 1 week off 21 capsule 1   pomalidomide (POMALYST) 2 MG capsule Take 1 capsule (2 mg total) by mouth daily. 3 weeks on 1 week off 21 capsule 1   pravastatin (PRAVACHOL) 20 MG tablet TAKE 1 TABLET (20 MG TOTAL) BY MOUTH DAILY. 90 tablet 2   predniSONE (DELTASONE) 10 MG tablet Take 1 tablet (10 mg total) by mouth daily with breakfast for 21 days. 21 tablet 0   prochlorperazine (COMPAZINE) 10 MG tablet Take 1 tablet (10 mg total) by  mouth every 6 (six) hours as needed (Nausea or vomiting). 30 tablet 1   tadalafil (CIALIS) 20 MG tablet Take 20 mg by mouth as needed.     tiZANidine (ZANAFLEX) 2 MG tablet Take 1 tablet (2 mg total) by mouth at bedtime as needed for muscle spasms. 30 tablet 1   valsartan (DIOVAN) 40 MG tablet Take 1 tablet (40 mg total) by mouth daily. STOP LISINOPRIL 90 tablet 3   vitamin B-12 (CYANOCOBALAMIN) 100 MCG tablet Take 100 mcg by mouth daily.     No current facility-administered medications for this visit.    ROS .10 Point review of Systems was done is negative except as noted above.  PHYSICAL EXAMINATION: .BP 138/63 (BP Location: Left Arm, Patient Position: Sitting)   Pulse 80   Temp 97.7 F (36.5 C) (Temporal)   Resp 15   Wt 169 lb 6.4 oz (76.8 kg)   SpO2 98%   BMI 26.53 kg/m  . GENERAL:alert, in no acute distress and comfortable SKIN: no acute rashes, no significant lesions EYES: conjunctiva are pink and non-injected, sclera anicteric OROPHARYNX: MMM, no exudates, no oropharyngeal erythema or ulceration NECK: supple, no JVD LYMPH:  no palpable lymphadenopathy in the cervical, axillary or inguinal regions LUNGS: clear to auscultation b/l with normal respiratory effort HEART: regular rate & rhythm ABDOMEN:  normoactive bowel sounds , non tender, not distended. Extremity: no pedal edema PSYCH: alert & oriented x 3 with fluent speech NEURO: no focal motor/sensory deficits   LABORATORY DATA:  I have reviewed the data as listed  .    Latest Ref Rng & Units 05/30/2022    9:21 AM 05/17/2022   10:38 AM 04/20/2022   11:55 AM  CBC  WBC 4.0 - 10.5 K/uL 4.2  9.3  7.4   Hemoglobin 13.0 - 17.0 g/dL 11.3  11.0  11.9   Hematocrit 39.0 - 52.0 % 32.7  32.2  33.2   Platelets 150 - 400 K/uL 177  233  202       Latest Ref Rng &  Units 05/30/2022    9:21 AM 05/17/2022   10:38 AM 05/16/2022    9:40 AM  CMP  Glucose 70 - 99 mg/dL 143  165  197   BUN 8 - 23 mg/dL _0 Creatinine  0.61 - 1.24 mg/dL 1.36  1.35  1.27   Sodium 135 - 145 mmol/L 137  138  141   Potassium 3.5 - 5.1 mmol/L 3.9  4.5  4.3   Chloride 98 - 111 mmol/L 108  105  103   CO2 22 - 32 mmol/L _1 Calcium 8.9 - 10.3 mg/dL 9.3  9.1  9.3   Total Protein 6.5 - 8.1 g/dL 6.5  6.5    Total Bilirubin 0.3 - 1.2 mg/dL 0.6  0.5    Alkaline Phos 38 - 126 U/L 111  77    AST 15 - 41 U/L 22  21    ALT 0 - 44 U/L 26  32       08/29/17 BM Bx:    08/29/17 Cytogenetics:   03/13/17 Cytogenetics:        PATHOLOGY Surgical Pathology  CASE: [id number]  PATIENT: Robert Sparks  Bone Marrow Report      Clinical History: Multiple myeloma in relapse ([place]) ,([facility])      DIAGNOSIS:   BONE MARROW, ASPIRATE, CLOT, CORE:  -Hypercellular bone marrow for age with plasma cell neoplasm  -See comment   PERIPHERAL BLOOD:  -Mild normocytic-normochromic anemia  -Eosinophilia   COMMENT:   The bone marrow is hypercellular for age with increased number of plasma  cells representing 8% of all cells in the aspirate associated with  numerous small clusters in the clot and biopsy sections. The plasma  cells display lambda light chain restriction consistent with plasma cell  neoplasm.  The background shows trilineage hematopoiesis with generally  nonspecific myeloid changes, likely secondary in nature in this setting.  Correlation with cytogenetic and FISH studies is recommended.      RADIOGRAPHIC STUDIES: I have personally reviewed the radiological images as listed and agreed with the findings in the report. No results found.  ASSESSMENT & PLAN:   76 y.o. male with   1.  Relapsed IgA Lambda Multiple Myeloma ISS Stage I    Active disease was previously diagnosed based on presence of anemia, kidney insufficiency, and paraproteinemia with significant predominance of lambda light chains as well as significant elevation of IgA. Patient is status post patient was treated with induction bortezomib  Revlimid dexamethasone.  Had issues with skin rashes with Revlimid and this was discontinued.  Completed bortezomib dexamethasone induction and achieved complete remission. Was on maintenance Velcade 2 weeks which was then switched to maintenance Ninlaro. Patient had issues with worsening neuropathy which was a combination of Velcade and his diabetes.  Ninlaro was discontinued as well after maintenance of more than 2 years.  2.  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 Bone marrow biopsy shows 8% lambda restricted plasma cells PET CT scan with no hypermetabolic osseous lesions  3.  Hypertension 4.  Diabetes type 2 5 .chronic kidney disease due to hypertension and diabetes. 6.  Peripheral neuropathy related to diabetes and previous myeloma treatments.  Plan: -Discussed labs from today, 05/30/2022. Labs showed stable CBC and CMP. -Myeloma panel with no M spike IFE +ve for IgA monoclonal protein.  -No new focal symptoms related to  multiple myeloma. -No new toxicities related to his daratumumab OR Pomalidomide. -We will continue daratumumab with same supportive medications.  Follow up: Per integrated scheduling  The total time spent in the appointment was 30 minutes* .  All of the patient's questions were answered with apparent satisfaction. The patient knows to call the clinic with any problems, questions or concerns.   Sullivan Lone MD MS AAHIVMS [facility] Hematology/Oncology Physician [facility]  .*Total Encounter Time as defined by the Centers for Medicare and Medicaid Services includes, in addition to the face-to-face time of a patient visit (documented in the note above) non-face-to-face time: obtaining and reviewing outside history, ordering and reviewing medications, tests or procedures, care coordination (communications with other health care professionals or caregivers) and  documentation in the medical record.   Zettie Cooley, am acting as a Education administrator for Sullivan Lone, MD.

## 2022-05-30 NOTE — Patient Instructions (Signed)
[facility] ONCOLOGY  Discharge Instructions: Thank you for choosing [facility] to provide your oncology and hematology care.   If you have a lab appointment with the [facility], please go directly to the [facility] and check in at the registration area.   Wear comfortable clothing and clothing appropriate for easy access to any Portacath or PICC line.   We strive to give you quality time with your provider. You may need to reschedule your appointment if you arrive late (15 or more minutes).  Arriving late affects you and other patients whose appointments are after yours.  Also, if you miss three or more appointments without notifying the office, you may be dismissed from the clinic at the provider's discretion.      For prescription refill requests, have your pharmacy contact our office and allow 72 hours for refills to be completed.    Today you received the following chemotherapy and/or immunotherapy agents: daratumumab      To help prevent nausea and vomiting after your treatment, we encourage you to take your nausea medication as directed.  BELOW ARE SYMPTOMS THAT SHOULD BE REPORTED IMMEDIATELY: *FEVER GREATER THAN 100.4 F (38 C) OR HIGHER *CHILLS OR SWEATING *NAUSEA AND VOMITING THAT IS NOT CONTROLLED WITH YOUR NAUSEA MEDICATION *UNUSUAL SHORTNESS OF BREATH *UNUSUAL BRUISING OR BLEEDING *URINARY PROBLEMS (pain or burning when urinating, or frequent urination) *BOWEL PROBLEMS (unusual diarrhea, constipation, pain near the anus) TENDERNESS IN MOUTH AND THROAT WITH OR WITHOUT PRESENCE OF ULCERS (sore throat, sores in mouth, or a toothache) UNUSUAL RASH, SWELLING OR PAIN  UNUSUAL VAGINAL DISCHARGE OR ITCHING   Items with * indicate a potential emergency and should be followed up as soon as possible or go to the Emergency Department if any problems should occur.  Please show the CHEMOTHERAPY ALERT CARD or IMMUNOTHERAPY ALERT CARD at check-in  to the Emergency Department and triage nurse.  Should you have questions after your visit or need to cancel or reschedule your appointment, please contact [facility]  Dept: [phone]  and follow the prompts.  Office hours are 8:00 a.m. to 4:30 p.m. Monday - Friday. Please note that voicemails left after 4:00 p.m. may not be returned until the following business day.  We are closed weekends and major holidays. You have access to a nurse at all times for urgent questions. Please call the main number to the clinic Dept: [phone] and follow the prompts.   For any non-urgent questions, you may also contact your provider using MyChart. We now offer e-Visits for anyone 60 and older to request care online for non-urgent symptoms. For details visit [email or web address].   Also download the MyChart app! Go to the app store, search "MyChart", open the app, select [facility], and log in with your MyChart username and password.  Masks are optional in the cancer centers. If you would like for your care team to wear a mask while they are taking care of you, please let them know. You may have one support Jacie Tristan who is at least 74 years old accompany you for your appointments.

## 2022-05-31 ENCOUNTER — Other Ambulatory Visit: Payer: Self-pay

## 2022-05-31 NOTE — Progress Notes (Signed)
  Subjective:  Patient ID: Robert Sparks, male    DOB: [birth year],  MRN: [id number]  [name] presents to clinic today for at risk foot care with history of diabetic neuropathy and callus(es) left lower extremity, porokeratotic lesion(s) right lower extremity, and painful mycotic nails. Painful toenails interfere with ambulation. Aggravating factors include wearing enclosed shoe gear. Pain is relieved with periodic professional debridement. Painful callus(es) and porokeratotic lesion(s) are aggravated when weightbearing with and without shoegear. Pain is relieved with periodic professional debridement.  Chief Complaint  Patient presents with   Nail Problem    Diabetic foot care [id number] A1C-Do not remember   PCP-Deborah Wynetta Emery PCP VST-Early Nov.2023     New problem(s): None.   PCP is [name], MD.  Allergies  Allergen Reactions   Other Other (See Comments)    Nicoderm CQ = Bad Dreams Nicotine gum = Bad Dreams    Lisinopril Cough    Review of Systems: Negative except as noted in the HPI.  Objective: No changes noted in today's physical examination.  Robert Sparks is a pleasant 76 y.o. male WD, WN in NAD. AAO x 3.  Vascular Examination: CFT immediate b/l LE. Palpable DP/PT pulses b/l LE. Digital hair present b/l. Skin temperature gradient WNL b/l. No pain with calf compression b/l. No edema noted b/l. No cyanosis or clubbing noted b/l LE.  Dermatological Examination: Pedal skin warm and supple b/l.  No open wounds b/l. No interdigital macerations. Toenails 1-5 b/l elongated, thickened, discolored with subungual debris. +Tenderness with dorsal palpation of nailplates.   Hyperkeratotic lesion(s) noted 1st metatarsal head left foot.    Porokeratotic lesion(s) noted R hallux. No erythema, no edema, no drainage, no fluctuance.  Musculoskeletal Examination: Normal muscle strength 5/5 to all lower extremity muscle groups bilaterally. HAV with bunion deformity  noted b/l LE.[name] No pain, crepitus or joint limitation noted with ROM b/l LE.  Patient ambulates independently without assistive aids.  Neurological Examination: Protective sensation diminished with 10g monofilament b/l. Vibratory sensation intact b/l.  Assessment/Plan: 1. Pain due to onychomycosis of toenails of both feet   2. Callus   3. Porokeratosis   4. Diabetic peripheral neuropathy associated with type 2 diabetes mellitus (HCC)     No orders of the defined types were placed in this encounter.   -Consent given for treatment as described below: -Patient to continue soft, supportive shoe gear daily. -Toenails 1-5 b/l were debrided in length and girth with sterile nail nippers and dremel without iatrogenic bleeding.  -Callus(es) 1st metatarsal head left lower extremity pared utilizing sterile scalpel blade without complication or incident. Total number debrided =1. -Porokeratotic lesion(s) right great toe pared and enucleated with sterile currette without incident. Total number of lesions debrided=1. -Patient/POA to call should there be question/concern in the interim.   Return in about 3 months (around 08/26/2022).  Marzetta Board, DPM

## 2022-06-05 ENCOUNTER — Telehealth: Payer: Self-pay | Admitting: Internal Medicine

## 2022-06-05 DIAGNOSIS — N182 Chronic kidney disease, stage 2 (mild): Secondary | ICD-10-CM | POA: Diagnosis not present

## 2022-06-05 DIAGNOSIS — E559 Vitamin D deficiency, unspecified: Secondary | ICD-10-CM | POA: Diagnosis not present

## 2022-06-05 NOTE — Telephone Encounter (Signed)
Call placed to patient on number provided and VM was left informing patient to return phone call.  CRM created

## 2022-06-05 NOTE — Telephone Encounter (Signed)
Patient would like [place] to call [phone] for lab results.

## 2022-06-05 NOTE — Telephone Encounter (Signed)
Pt is calling for lab results. Transferred to NT no notes in chart to advise the patient. Directed to send message to practice. Please advise with the patient [phone]

## 2022-06-06 ENCOUNTER — Encounter: Payer: Self-pay | Admitting: Hematology

## 2022-06-06 ENCOUNTER — Ambulatory Visit: Payer: Self-pay | Admitting: *Deleted

## 2022-06-06 NOTE — Telephone Encounter (Signed)
Attempt to call patient. Voicemail not set up.  No labs have been ordered from [name]  recently. Needs to f/u with his Oncologist.

## 2022-06-06 NOTE — Telephone Encounter (Signed)
Pt called to discuss lab results / NT stated there were no notes on results and advised to forward to practice / please advise

## 2022-06-06 NOTE — Telephone Encounter (Signed)
Reason for Disposition  Health Information question, no triage required and triager able to answer question  Answer Assessment - Initial Assessment Questions 1. REASON FOR CALL or QUESTION: "What is your reason for calling today?" or "How can I best help you?" or "What question do you have that I can help answer?"     Pt called into [facility] for his lab results.   Come to find out it was his oncologist that ordered the labs and he needed to call them.   Pt. Thanked me for my help.  Protocols used: Information Only Call - No Triage-A-AH

## 2022-06-13 ENCOUNTER — Ambulatory Visit: Payer: Medicare Other | Attending: Internal Medicine | Admitting: Pharmacist

## 2022-06-13 ENCOUNTER — Encounter: Payer: Self-pay | Admitting: Pharmacist

## 2022-06-13 VITALS — BP 113/67 | HR 80

## 2022-06-13 DIAGNOSIS — E1142 Type 2 diabetes mellitus with diabetic polyneuropathy: Secondary | ICD-10-CM

## 2022-06-13 MED FILL — Dexamethasone Sodium Phosphate Inj 100 MG/10ML: INTRAMUSCULAR | Qty: 2 | Status: AC

## 2022-06-13 NOTE — Progress Notes (Signed)
    S:     No chief complaint on file.  Robert Sparks is a 76 y.o. male who presents for diabetes evaluation, education, and management.  PMH is significant for HTN, T2DM with polyneuropathy, HL, CKD stage 3, prostate CA, multiple myeloma in remission.  Patient was referred and last seen by Primary Care Provider, [name], on 05/08/2022.   At last visit, Humalog 75/25 dose was increased to 8U BID given prednisone use. Today, patient arrives in good spirits and presents without any assistance.   Patient reports Diabetes is longstanding.   Family/Social History:  -Fhx: CKD -Tobacco: current 0.5 PPD smoker -Alcohol: none reported  Current diabetes medications include: Humalog 75/25 - takes 7u BID  Patient reports adherence to taking all medications as prescribed.   Patient denies hypoglycemic events. Patient reports having a couple of isolated readings in the 60s but this occurs with prolonged periods between meals.  Reported home fasting blood sugars: usually 110s-130s. Sometimes will be in the 80s. Reported 2 hour post-meal/random blood sugars: 120s-130s  Patient denies nocturia (nighttime urination).  Patient reports neuropathy (nerve pain). Patient denies visual changes. Patient reports self foot exams.   Patient reported dietary habits:  -Tries to be adherent with diabetic diet.  Patient-reported exercise habits:  -Patient doesn't have an exercising regimen but tries to have occasional walks about 3 days a week.   O:  Lab Results  Component Value Date   HGBA1C 7.9 (A) 05/08/2022   There were no vitals filed for this visit.  Lipid Panel     Component Value Date/Time   CHOL 184 04/25/2019 0939   TRIG 114 04/25/2019 0939   HDL 88 04/25/2019 0939   CHOLHDL 2.1 04/25/2019 0939   CHOLHDL 3.6 06/21/2016 1047   VLDL 50 (H) 06/21/2016 1047   LDLCALC 76 04/25/2019 0939   Clinical Atherosclerotic Cardiovascular Disease (ASCVD): No  The ASCVD Risk score (Arnett  DK, et al., 2019) failed to calculate for the following reasons:   Cannot find a previous HDL lab   Cannot find a previous total cholesterol lab   A/P: Diabetes longstanding currently above goal based on A1c of 7.9%. Patient is able to verbalize appropriate hypoglycemia management plan. Medication adherence appears appropriate.  -Continued Humalog 75/25 8 units twice daily. Instructed patient to reduce dose to 6 units twice daily if hypoglycemia becomes more frequent. -Extensively discussed pathophysiology of diabetes, recommended lifestyle interventions, dietary effects on blood sugar control.  -Counseled on s/sx of and management of hypoglycemia.  -Next A1c anticipated 07/2021.   Written patient instructions provided. Patient verbalized understanding of treatment plan.  Total time in face to face counseling 20 minutes.    Follow-up:  -Follow up with PCP.   Pharmacist:  Benard Halsted, PharmD, [place], [place] [phone]

## 2022-06-14 ENCOUNTER — Inpatient Hospital Stay (HOSPITAL_BASED_OUTPATIENT_CLINIC_OR_DEPARTMENT_OTHER): Payer: Medicare Other | Admitting: Hematology

## 2022-06-14 ENCOUNTER — Other Ambulatory Visit: Payer: Self-pay

## 2022-06-14 ENCOUNTER — Inpatient Hospital Stay: Payer: Medicare Other

## 2022-06-14 ENCOUNTER — Inpatient Hospital Stay: Payer: Medicare Other | Attending: Hematology

## 2022-06-14 VITALS — BP 136/69 | HR 79 | Temp 97.9°F | Resp 18

## 2022-06-14 VITALS — BP 128/71 | HR 88 | Temp 97.9°F | Resp 20 | Ht 67.0 in | Wt 167.5 lb

## 2022-06-14 DIAGNOSIS — C9002 Multiple myeloma in relapse: Secondary | ICD-10-CM

## 2022-06-14 DIAGNOSIS — E1140 Type 2 diabetes mellitus with diabetic neuropathy, unspecified: Secondary | ICD-10-CM | POA: Diagnosis not present

## 2022-06-14 DIAGNOSIS — Z7189 Other specified counseling: Secondary | ICD-10-CM

## 2022-06-14 DIAGNOSIS — N189 Chronic kidney disease, unspecified: Secondary | ICD-10-CM | POA: Diagnosis not present

## 2022-06-14 DIAGNOSIS — E1122 Type 2 diabetes mellitus with diabetic chronic kidney disease: Secondary | ICD-10-CM | POA: Insufficient documentation

## 2022-06-14 DIAGNOSIS — Z5112 Encounter for antineoplastic immunotherapy: Secondary | ICD-10-CM | POA: Diagnosis not present

## 2022-06-14 DIAGNOSIS — I129 Hypertensive chronic kidney disease with stage 1 through stage 4 chronic kidney disease, or unspecified chronic kidney disease: Secondary | ICD-10-CM | POA: Insufficient documentation

## 2022-06-14 LAB — CBC WITH DIFFERENTIAL (CANCER CENTER ONLY)
Abs Immature Granulocytes: 0.02 10*3/uL (ref 0.00–0.07)
Basophils Absolute: 0 10*3/uL (ref 0.0–0.1)
Basophils Relative: 0 %
Eosinophils Absolute: 0.1 10*3/uL (ref 0.0–0.5)
Eosinophils Relative: 2 %
HCT: 32.7 % — ABNORMAL LOW (ref 39.0–52.0)
Hemoglobin: 11.1 g/dL — ABNORMAL LOW (ref 13.0–17.0)
Immature Granulocytes: 0 %
Lymphocytes Relative: 9 %
Lymphs Abs: 0.6 10*3/uL — ABNORMAL LOW (ref 0.7–4.0)
MCH: 34 pg (ref 26.0–34.0)
MCHC: 33.9 g/dL (ref 30.0–36.0)
MCV: 100.3 fL — ABNORMAL HIGH (ref 80.0–100.0)
Monocytes Absolute: 0.2 10*3/uL (ref 0.1–1.0)
Monocytes Relative: 3 %
Neutro Abs: 5.8 10*3/uL (ref 1.7–7.7)
Neutrophils Relative %: 86 %
Platelet Count: 193 10*3/uL (ref 150–400)
RBC: 3.26 MIL/uL — ABNORMAL LOW (ref 4.22–5.81)
RDW: 14.5 % (ref 11.5–15.5)
WBC Count: 6.8 10*3/uL (ref 4.0–10.5)
nRBC: 0 % (ref 0.0–0.2)

## 2022-06-14 LAB — COMPREHENSIVE METABOLIC PANEL
ALT: 35 U/L (ref 0–44)
AST: 21 U/L (ref 15–41)
Albumin: 4 g/dL (ref 3.5–5.0)
Alkaline Phosphatase: 78 U/L (ref 38–126)
Anion gap: 10 (ref 5–15)
BUN: 22 mg/dL (ref 8–23)
CO2: 22 mmol/L (ref 22–32)
Calcium: 9.5 mg/dL (ref 8.9–10.3)
Chloride: 106 mmol/L (ref 98–111)
Creatinine, Ser: 1.47 mg/dL — ABNORMAL HIGH (ref 0.61–1.24)
GFR, Estimated: 50 mL/min — ABNORMAL LOW (ref >60)
Glucose, Bld: 213 mg/dL — ABNORMAL HIGH (ref 70–99)
Potassium: 3.7 mmol/L (ref 3.5–5.1)
Sodium: 138 mmol/L (ref 135–145)
Total Bilirubin: 0.7 mg/dL (ref 0.3–1.2)
Total Protein: 6.4 g/dL — ABNORMAL LOW (ref 6.5–8.1)

## 2022-06-14 MED ORDER — ACETAMINOPHEN 325 MG PO TABS
650.0000 mg | ORAL_TABLET | Freq: Once | ORAL | Status: AC
  Administered 2022-06-14: 650 mg via ORAL
  Filled 2022-06-14: qty 2

## 2022-06-14 MED ORDER — SODIUM CHLORIDE 0.9 % IV SOLN: Freq: Once | INTRAVENOUS | Status: AC

## 2022-06-14 MED ORDER — SODIUM CHLORIDE 0.9 % IV SOLN
20.0000 mg | Freq: Once | INTRAVENOUS | Status: AC
  Administered 2022-06-14: 20 mg via INTRAVENOUS
  Filled 2022-06-14: qty 20

## 2022-06-14 MED ORDER — DIPHENHYDRAMINE HCL 25 MG PO CAPS
50.0000 mg | ORAL_CAPSULE | Freq: Once | ORAL | Status: AC
  Administered 2022-06-14: 50 mg via ORAL
  Filled 2022-06-14: qty 2

## 2022-06-14 MED ORDER — FAMOTIDINE IN NACL 20-0.9 MG/50ML-% IV SOLN
20.0000 mg | Freq: Once | INTRAVENOUS | Status: AC
  Administered 2022-06-14: 20 mg via INTRAVENOUS
  Filled 2022-06-14: qty 50

## 2022-06-14 MED ORDER — SODIUM CHLORIDE 0.9 % IV SOLN
16.0000 mg/kg | Freq: Once | INTRAVENOUS | Status: AC
  Administered 2022-06-14: 1200 mg via INTRAVENOUS
  Filled 2022-06-14: qty 60

## 2022-06-14 NOTE — Progress Notes (Signed)
Patient seen by MD today  Vitals are within treatment parameters.  Labs reviewed: and are within treatment parameters."Dr Irene Limbo is aware of lab results/ he has reviewed them.   Per physician team, patient is ready for treatment and there are NO modifications to the treatment plan.

## 2022-06-14 NOTE — Progress Notes (Signed)
HEMATOLOGY/ONCOLOGY CLINIC NOTE  Date of Service: 06/14/22    Patient Care Team: [name], MD as PCP - General (Internal Medicine)  CHIEF COMPLAINTS/PURPOSE OF CONSULTATION:  Follow-up for continued evaluation and management of relapsed multiple myeloma  Oncologic History:  76 y.o. male with diagnosis of IgA lambda active multiple myeloma, ISS Stage I. Active disease diagnosed based on presence of anemia, kidney insufficiency, and paraproteinemia with significant predominance of lambda light chains as well as significant elevation of IgA. Bone marrow biopsy confirmed presence of atypical monoclonal plasma cell process in the bone marrow comprising at least 7% of the cellularity. Based on the findings, patient was started on treatment with lenalidomide, bortezomib, and low-dose dexamethasone based on the anticipated tolerance by the patient. Lenalidomide was started at 10 mg daily based on creatinine clearance of 42, dexamethasone dose was reduced to 20 mg weekly based on patient's age. Treatment was complicated by rapid development of cutaneous rash attributed to lenalidomide, inaddition to decreased renal function and now patient has persistent severe anemia. Side-effects were attributed to Lenalidomide and lenalidomide was discontinued with subsequent recovery.  Patient has been receiving bortezomib weekly with low-dose dexamethasone in 4-day cycles.   Patient has completed induction systemic therapy for his disease with repeat bone marrow biopsy confirming complete response including no evidence of minimal residual disease by cytogenetics or FISH.  HISTORY OF PRESENTING ILLNESS:  Please see previous note for details of initial presentation.  INTERVAL HISTORY:   [name] is a 76 y.o. male is here for continued evaluation and management of his relapsed multiple myeloma. He is here today for cycle 2 day 1 of Daratumumab.   Patient was last seen by me on 05/30/2022  and complained of occasional insomnia. He also complained of grade 1 diarrhea and dizziness due to Daratumumab and Pomalidomide.  Patient reports he has been doing well overall since our last visit. He complains of mild neck pain/discomfort since our last visit. He denies of any toxicities with his treatment. He does complain of dizziness due to Pomalidomide.   Patient is interested in traveling to his hometown, [place], [place], middle of December to 07/04/2022. He would like to move his next treatment from 06/28/2022 to 07/05/2022.  He denies loss of appetite, fever, chills, abdominal pain, back pain, or leg swelling.    MEDICAL HISTORY:  Past Medical History:  Diagnosis Date   Anemia    Arthritis    shoulders,feet    Cataract    per eye dr -has appt 5-11   CKD (chronic kidney disease), stage III (HCC)    Elevated PSA    History of ketoacidosis    03-29-2015    History of sepsis    07-25-2013  non-compliant w/ medication   Hyperlipidemia    Hypertension    Nocturia    Peripheral neuropathy    Prostate cancer ([facility])    Type 2 diabetes mellitus with insulin therapy [facility])     SURGICAL HISTORY: Past Surgical History:  Procedure Laterality Date   CIRCUMCISION  1990's   PROSTATE BIOPSY N/A 12/21/2015   Procedure: BIOPSY TRANSRECTAL ULTRASONIC PROSTATE (TUBP);  Surgeon: Festus Aloe, MD;  Location: [facility];  Service: Urology;  Laterality: N/A;    SOCIAL HISTORY: Social History   Socioeconomic History   Marital status: Single    Spouse name: Not on file   Number of children: Not on file   Years of education: Not on file   Highest  education level: Not on file  Occupational History   Not on file  Tobacco Use   Smoking status: Every Day    Packs/day: 0.50    Years: 50.00    Total pack years: 25.00    Types: Cigarettes   Smokeless tobacco: Never   Tobacco comments:    1 ppwk-4-5 a day   Vaping Use   Vaping Use: Never used  Substance and Sexual  Activity   Alcohol use: Yes    Alcohol/week: 1.0 standard drink of alcohol    Types: 1 Cans of beer per week    Comment: 1 every day-quit couple weeks ago   Drug use: No   Sexual activity: Not Currently  Other Topics Concern   Not on file  Social History Narrative   Not on file   Social Determinants of Health   Financial Resource Strain: Not on file  Food Insecurity: Not on file  Transportation Needs: Not on file  Physical Activity: Not on file  Stress: Not on file  Social Connections: Not on file  Intimate Partner Violence: Not on file    FAMILY HISTORY: Family History  Problem Relation Age of Onset   Kidney disease Mother    Cancer Neg Hx    Colon cancer Neg Hx    Colon polyps Neg Hx    Esophageal cancer Neg Hx    Rectal cancer Neg Hx    Stomach cancer Neg Hx     ALLERGIES:  is allergic to other and lisinopril.  MEDICATIONS:  Current Outpatient Medications  Medication Sig Dispense Refill   Accu-Chek Softclix Lancets lancets Use to test blood sugar 3 times daily. 100 each 3   acyclovir (ZOVIRAX) 400 MG tablet Take 1 tablet (400 mg total) by mouth 2 (two) times daily. 60 tablet 11   amLODipine (NORVASC) 10 MG tablet TAKE 1 TABLET (10 MG TOTAL) BY MOUTH DAILY. 90 tablet 3   aspirin EC 81 MG tablet Take 81 mg by mouth daily. Swallow whole.     b complex vitamins capsule Take 1 capsule by mouth daily. 30 capsule 5   Blood Glucose Monitoring Suppl (ACCU-CHEK GUIDE) w/Device KIT Use to test blood sugar three times daily 1 kit 0   Continuous Blood Gluc Receiver (FREESTYLE LIBRE READER) DEVI Use to check blood sugar 3x daily. E11.42 1 each 0   Continuous Blood Gluc Sensor (FREESTYLE LIBRE SENSOR SYSTEM) MISC Use to check blood sugar 3 times daily. Change sensor every 2 weeks. 2 each 12   dexamethasone (DECADRON) 4 MG tablet 49m (5 tabs) Take the day after each dose of daratumumab. Take with breakfast 20 tablet 11   diclofenac Sodium (VOLTAREN) 1 % GEL Apply 4 g topically 4  (four) times daily. 150 g 5   donepezil (ARICEPT) 5 MG tablet TAKE 1 TABLET (5 MG TOTAL) BY MOUTH AT BEDTIME. FOR MEMORY ISSUES 30 tablet 5   ferrous sulfate 325 (65 FE) MG tablet Take 325 mg by mouth daily.     gabapentin (NEURONTIN) 300 MG capsule TAKE 1 CAPSULE BY MOUTH IN THE MORNING AND LUNCH AND 2 AT BEDTIME 120 capsule 6   glucose blood (ACCU-CHEK GUIDE) test strip Use to test blood sugar 3 times daily. 100 each 2   hydrOXYzine (ATARAX) 25 MG tablet TAKE 1 TABLET (25 MG TOTAL) BY MOUTH 3 (THREE) TIMES DAILY AS NEEDED FOR ITCHING (RASH). 30 tablet 0   Insulin Lispro Prot & Lispro (HUMALOG MIX 75/25 KWIKPEN) (75-25) 100 UNIT/ML Kwikpen inject  8 units subcutaneously with breakfast and dinner. 15 mL 11   Insulin Pen Needle (BD PEN NEEDLE NANO U/F) 32G X 4 MM MISC USE AS DIRECTED TWICE DAILY WITH INSULIN 100 each 6   Miconazole Nitrate (ATHLETES FOOT POWDER SPRAY) 2 % AERP Spray between toes and under toes once daily. 130 g 4   Multiple Vitamin (MULTIVITAMIN WITH MINERALS) TABS tablet Take 1 tablet by mouth daily. 60 tablet 2   ondansetron (ZOFRAN) 8 MG tablet Take 1 tablet (8 mg total) by mouth 2 (two) times daily as needed (Nausea or vomiting). 30 tablet 1   polyethylene glycol (MIRALAX) 17 g packet Take 17 g by mouth daily. 30 each 2   pomalidomide (POMALYST) 2 MG capsule Take 1 capsule (2 mg total) by mouth daily. 3 weeks on 1 week off 21 capsule 1   pomalidomide (POMALYST) 2 MG capsule Take 1 capsule (2 mg total) by mouth daily. 3 weeks on 1 week off 21 capsule 1   pravastatin (PRAVACHOL) 20 MG tablet TAKE 1 TABLET (20 MG TOTAL) BY MOUTH DAILY. 90 tablet 2   predniSONE (DELTASONE) 10 MG tablet Take 1 tablet (10 mg total) by mouth daily with breakfast for 21 days. 21 tablet 0   prochlorperazine (COMPAZINE) 10 MG tablet Take 1 tablet (10 mg total) by mouth every 6 (six) hours as needed (Nausea or vomiting). 30 tablet 1   tadalafil (CIALIS) 20 MG tablet Take 20 mg by mouth as needed.      tiZANidine (ZANAFLEX) 2 MG tablet Take 1 tablet (2 mg total) by mouth at bedtime as needed for muscle spasms. 30 tablet 1   valsartan (DIOVAN) 40 MG tablet Take 1 tablet (40 mg total) by mouth daily. STOP LISINOPRIL 90 tablet 3   vitamin B-12 (CYANOCOBALAMIN) 100 MCG tablet Take 100 mcg by mouth daily.     No current facility-administered medications for this visit.    ROS .10 Point review of Systems was done is negative except as noted above.  PHYSICAL EXAMINATION: .BP 128/71 (BP Location: Left Arm, Patient Position: Sitting)   Pulse 88   Temp 97.9 F (36.6 C) (Temporal)   Resp 20   Ht 5' 7" (1.702 m)   Wt 167 lb 8 oz (76 kg)   SpO2 97%   BMI 26.23 kg/m  . GENERAL:alert, in no acute distress and comfortable SKIN: no acute rashes, no significant lesions EYES: conjunctiva are pink and non-injected, sclera anicteric OROPHARYNX: MMM, no exudates, no oropharyngeal erythema or ulceration NECK: supple, no JVD LYMPH:  no palpable lymphadenopathy in the cervical, axillary or inguinal regions LUNGS: clear to auscultation b/l with normal respiratory effort HEART: regular rate & rhythm ABDOMEN:  normoactive bowel sounds , non tender, not distended. Extremity: no pedal edema PSYCH: alert & oriented x 3 with fluent speech NEURO: no focal motor/sensory deficits   LABORATORY DATA:  I have reviewed the data as listed  .    Latest Ref Rng & Units 06/14/2022   10:09 AM 05/30/2022    9:21 AM 05/17/2022   10:38 AM  CBC  WBC 4.0 - 10.5 K/uL 6.8  4.2  9.3   Hemoglobin 13.0 - 17.0 g/dL 11.1  11.3  11.0   Hematocrit 39.0 - 52.0 % 32.7  32.7  32.2   Platelets 150 - 400 K/uL 193  177  233       Latest Ref Rng & Units 06/14/2022   10:09 AM 05/30/2022    9:21 AM 05/17/2022  10:38 AM  CMP  Glucose 70 - 99 mg/dL 213  143  165   BUN 8 - 23 mg/dL _0 Creatinine 0.61 - 1.24 mg/dL 1.47  1.36  1.35   Sodium 135 - 145 mmol/L 138  137  138   Potassium 3.5 - 5.1 mmol/L 3.7  3.9  4.5    Chloride 98 - 111 mmol/L 106  108  105   CO2 22 - 32 mmol/L _1 Calcium 8.9 - 10.3 mg/dL 9.5  9.3  9.1   Total Protein 6.5 - 8.1 g/dL 6.4  6.5  6.5   Total Bilirubin 0.3 - 1.2 mg/dL 0.7  0.6  0.5   Alkaline Phos 38 - 126 U/L 78  111  77   AST 15 - 41 U/L _2 ALT 0 - 44 U/L 35  26  32      08/29/17 BM Bx:    08/29/17 Cytogenetics:   03/13/17 Cytogenetics:        PATHOLOGY Surgical Pathology  CASE: [id number]  PATIENT: Robert Sparks  Bone Marrow Report      Clinical History: Multiple myeloma in relapse ([facility]) ,([facility])      DIAGNOSIS:   BONE MARROW, ASPIRATE, CLOT, CORE:  -Hypercellular bone marrow for age with plasma cell neoplasm  -See comment   PERIPHERAL BLOOD:  -Mild normocytic-normochromic anemia  -Eosinophilia   COMMENT:   The bone marrow is hypercellular for age with increased number of plasma  cells representing 8% of all cells in the aspirate associated with  numerous small clusters in the clot and biopsy sections. The plasma  cells display lambda light chain restriction consistent with plasma cell  neoplasm.  The background shows trilineage hematopoiesis with generally  nonspecific myeloid changes, likely secondary in nature in this setting.  Correlation with cytogenetic and FISH studies is recommended.      RADIOGRAPHIC STUDIES: I have personally reviewed the radiological images as listed and agreed with the findings in the report. No results found.  ASSESSMENT & PLAN:   76 y.o. male with   1.  Relapsed IgA Lambda Multiple Myeloma ISS Stage I    Active disease was previously diagnosed based on presence of anemia, kidney insufficiency, and paraproteinemia with significant predominance of lambda light chains as well as significant elevation of IgA. Patient is status post patient was treated with induction bortezomib Revlimid dexamethasone.  Had issues with skin rashes with Revlimid and this was discontinued.  Completed  bortezomib dexamethasone induction and achieved complete remission. Was on maintenance Velcade 2 weeks which was then switched to maintenance Ninlaro. Patient had issues with worsening neuropathy which was a combination of Velcade and his diabetes.  Ninlaro was discontinued as well after maintenance of more than 2 years.  2.  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 Bone marrow biopsy shows 8% lambda restricted plasma cells PET CT scan with no hypermetabolic osseous lesions  3.  Hypertension 4.  Diabetes type 2 5 .chronic kidney disease due to hypertension and diabetes. 6.  Peripheral neuropathy related to diabetes and previous myeloma treatments.  Plan: -Discussed labs from today, 06/14/2022. CBC shows WBC of 6.8 K, hemoglobin of 11/1 K, hematocrit of 32.7 K, and platelets of 193. CMP showed glucose of 213, creatinine of 1.4. -Myeloma panel with no M spike IFE +ve for IgA  monoclonal protein.  -No new focal symptoms related to multiple myeloma. -We will continue daratumumab with same supportive medications. -Move his next chemotherapy treatment from 06/28/2022 to 07/05/2022 because he will be traveling to [facility], [place] until 07/04/2022. -No toxicities with Daratumumab, but mild dizziness with Pomalidomide.  Follow up: Per integrated scheduling The total time spent in the appointment was 30 minutes* .  All of the patient's questions were answered with apparent satisfaction. The patient knows to call the clinic with any problems, questions or concerns.   Sullivan Lone MD MS AAHIVMS [facility] Hematology/Oncology Physician [facility]  .*Total Encounter Time as defined by the Centers for Medicare and Medicaid Services includes, in addition to the face-to-face time of a patient visit (documented in the note above) non-face-to-face time: obtaining and reviewing outside history, ordering and  reviewing medications, tests or procedures, care coordination (communications with other health care professionals or caregivers) and documentation in the medical record.   I, Param Shah,am acting as a scribe for Sullivan Lone, MD.  .I have reviewed the above documentation for accuracy and completeness, and I agree with the above. Brunetta Genera MD

## 2022-06-14 NOTE — Patient Instructions (Signed)
[facility] ONCOLOGY  Discharge Instructions: Thank you for choosing [facility] to provide your oncology and hematology care.   If you have a lab appointment with the [facility], please go directly to the [facility] and check in at the registration area.   Wear comfortable clothing and clothing appropriate for easy access to any Portacath or PICC line.   We strive to give you quality time with your provider. You may need to reschedule your appointment if you arrive late (15 or more minutes).  Arriving late affects you and other patients whose appointments are after yours.  Also, if you miss three or more appointments without notifying the office, you may be dismissed from the clinic at the provider's discretion.      For prescription refill requests, have your pharmacy contact our office and allow 72 hours for refills to be completed.    Today you received the following chemotherapy and/or immunotherapy agents: Daratumumab      To help prevent nausea and vomiting after your treatment, we encourage you to take your nausea medication as directed.  BELOW ARE SYMPTOMS THAT SHOULD BE REPORTED IMMEDIATELY: *FEVER GREATER THAN 100.4 F (38 C) OR HIGHER *CHILLS OR SWEATING *NAUSEA AND VOMITING THAT IS NOT CONTROLLED WITH YOUR NAUSEA MEDICATION *UNUSUAL SHORTNESS OF BREATH *UNUSUAL BRUISING OR BLEEDING *URINARY PROBLEMS (pain or burning when urinating, or frequent urination) *BOWEL PROBLEMS (unusual diarrhea, constipation, pain near the anus) TENDERNESS IN MOUTH AND THROAT WITH OR WITHOUT PRESENCE OF ULCERS (sore throat, sores in mouth, or a toothache) UNUSUAL RASH, SWELLING OR PAIN  UNUSUAL VAGINAL DISCHARGE OR ITCHING   Items with * indicate a potential emergency and should be followed up as soon as possible or go to the Emergency Department if any problems should occur.  Please show the CHEMOTHERAPY ALERT CARD or IMMUNOTHERAPY ALERT CARD at check-in  to the Emergency Department and triage nurse.  Should you have questions after your visit or need to cancel or reschedule your appointment, please contact [facility]  Dept: [phone]  and follow the prompts.  Office hours are 8:00 a.m. to 4:30 p.m. Monday - Friday. Please note that voicemails left after 4:00 p.m. may not be returned until the following business day.  We are closed weekends and major holidays. You have access to a nurse at all times for urgent questions. Please call the main number to the clinic Dept: [phone] and follow the prompts.   For any non-urgent questions, you may also contact your provider using MyChart. We now offer e-Visits for anyone 35 and older to request care online for non-urgent symptoms. For details visit [email or web address].   Also download the MyChart app! Go to the app store, search "MyChart", open the app, select [facility], and log in with your MyChart username and password.  Masks are optional in the cancer centers. If you would like for your care team to wear a mask while they are taking care of you, please let them know. You may have one support person who is at least 74 years old accompany you for your appointments.

## 2022-06-19 ENCOUNTER — Other Ambulatory Visit: Payer: Self-pay

## 2022-06-19 ENCOUNTER — Other Ambulatory Visit: Payer: Self-pay | Admitting: Hematology

## 2022-06-19 DIAGNOSIS — C9002 Multiple myeloma in relapse: Secondary | ICD-10-CM

## 2022-06-19 LAB — MULTIPLE MYELOMA PANEL, SERUM
Albumin SerPl Elph-Mcnc: 3.4 g/dL (ref 2.9–4.4)
Albumin/Glob SerPl: 1.4 (ref 0.7–1.7)
Alpha 1: 0.2 g/dL (ref 0.0–0.4)
Alpha2 Glob SerPl Elph-Mcnc: 0.9 g/dL (ref 0.4–1.0)
B-Globulin SerPl Elph-Mcnc: 0.9 g/dL (ref 0.7–1.3)
Gamma Glob SerPl Elph-Mcnc: 0.5 g/dL (ref 0.4–1.8)
Globulin, Total: 2.5 g/dL (ref 2.2–3.9)
IgA: 116 mg/dL (ref 61–437)
IgG (Immunoglobin G), Serum: 482 mg/dL — ABNORMAL LOW (ref 603–1613)
IgM (Immunoglobulin M), Srm: 47 mg/dL (ref 15–143)
Total Protein ELP: 5.9 g/dL — ABNORMAL LOW (ref 6.0–8.5)

## 2022-06-19 MED ORDER — PREDNISONE 10 MG PO TABS
10.0000 mg | ORAL_TABLET | Freq: Every day | ORAL | 0 refills | Status: DC
  Filled 2022-06-19: qty 21, 21d supply, fill #0

## 2022-06-20 ENCOUNTER — Other Ambulatory Visit: Payer: Self-pay

## 2022-06-20 ENCOUNTER — Encounter: Payer: Self-pay | Admitting: Hematology

## 2022-06-21 ENCOUNTER — Other Ambulatory Visit: Payer: Self-pay

## 2022-06-22 ENCOUNTER — Other Ambulatory Visit: Payer: Self-pay

## 2022-06-26 ENCOUNTER — Other Ambulatory Visit: Payer: Self-pay

## 2022-06-26 ENCOUNTER — Other Ambulatory Visit: Payer: Self-pay | Admitting: *Deleted

## 2022-06-26 DIAGNOSIS — C9002 Multiple myeloma in relapse: Secondary | ICD-10-CM

## 2022-06-26 MED ORDER — POMALIDOMIDE 2 MG PO CAPS: 2.0000 mg | ORAL_CAPSULE | Freq: Every day | ORAL | 1 refills | Status: DC

## 2022-06-28 ENCOUNTER — Other Ambulatory Visit: Payer: Medicare Other

## 2022-06-28 ENCOUNTER — Ambulatory Visit: Payer: Medicare Other

## 2022-07-04 ENCOUNTER — Other Ambulatory Visit: Payer: Self-pay

## 2022-07-05 ENCOUNTER — Ambulatory Visit: Payer: Medicare Other

## 2022-07-05 ENCOUNTER — Other Ambulatory Visit: Payer: Medicare Other

## 2022-07-05 ENCOUNTER — Other Ambulatory Visit: Payer: Self-pay

## 2022-07-06 MED FILL — Dexamethasone Sodium Phosphate Inj 100 MG/10ML: INTRAMUSCULAR | Qty: 2 | Status: AC

## 2022-07-07 ENCOUNTER — Inpatient Hospital Stay: Payer: Medicare Other

## 2022-07-07 ENCOUNTER — Other Ambulatory Visit: Payer: Self-pay

## 2022-07-07 VITALS — BP 140/73 | HR 77 | Temp 98.2°F | Resp 18 | Wt 174.2 lb

## 2022-07-07 DIAGNOSIS — E1140 Type 2 diabetes mellitus with diabetic neuropathy, unspecified: Secondary | ICD-10-CM | POA: Diagnosis not present

## 2022-07-07 DIAGNOSIS — Z7189 Other specified counseling: Secondary | ICD-10-CM

## 2022-07-07 DIAGNOSIS — C9002 Multiple myeloma in relapse: Secondary | ICD-10-CM | POA: Diagnosis not present

## 2022-07-07 DIAGNOSIS — Z5112 Encounter for antineoplastic immunotherapy: Secondary | ICD-10-CM | POA: Diagnosis not present

## 2022-07-07 DIAGNOSIS — N189 Chronic kidney disease, unspecified: Secondary | ICD-10-CM | POA: Diagnosis not present

## 2022-07-07 DIAGNOSIS — E1122 Type 2 diabetes mellitus with diabetic chronic kidney disease: Secondary | ICD-10-CM | POA: Diagnosis not present

## 2022-07-07 DIAGNOSIS — I129 Hypertensive chronic kidney disease with stage 1 through stage 4 chronic kidney disease, or unspecified chronic kidney disease: Secondary | ICD-10-CM | POA: Diagnosis not present

## 2022-07-07 LAB — CBC WITH DIFFERENTIAL (CANCER CENTER ONLY)
Abs Immature Granulocytes: 0.01 10*3/uL (ref 0.00–0.07)
Basophils Absolute: 0.1 10*3/uL (ref 0.0–0.1)
Basophils Relative: 2 %
Eosinophils Absolute: 0.1 10*3/uL (ref 0.0–0.5)
Eosinophils Relative: 2 %
HCT: 32.9 % — ABNORMAL LOW (ref 39.0–52.0)
Hemoglobin: 11.4 g/dL — ABNORMAL LOW (ref 13.0–17.0)
Immature Granulocytes: 0 %
Lymphocytes Relative: 21 %
Lymphs Abs: 1 10*3/uL (ref 0.7–4.0)
MCH: 34.8 pg — ABNORMAL HIGH (ref 26.0–34.0)
MCHC: 34.7 g/dL (ref 30.0–36.0)
MCV: 100.3 fL — ABNORMAL HIGH (ref 80.0–100.0)
Monocytes Absolute: 0.5 10*3/uL (ref 0.1–1.0)
Monocytes Relative: 12 %
Neutro Abs: 3 10*3/uL (ref 1.7–7.7)
Neutrophils Relative %: 63 %
Platelet Count: 180 10*3/uL (ref 150–400)
RBC: 3.28 MIL/uL — ABNORMAL LOW (ref 4.22–5.81)
RDW: 14.2 % (ref 11.5–15.5)
WBC Count: 4.7 10*3/uL (ref 4.0–10.5)
nRBC: 0 % (ref 0.0–0.2)

## 2022-07-07 LAB — COMPREHENSIVE METABOLIC PANEL
ALT: 53 U/L — ABNORMAL HIGH (ref 0–44)
AST: 24 U/L (ref 15–41)
Albumin: 4.1 g/dL (ref 3.5–5.0)
Alkaline Phosphatase: 65 U/L (ref 38–126)
Anion gap: 7 (ref 5–15)
BUN: 21 mg/dL (ref 8–23)
CO2: 25 mmol/L (ref 22–32)
Calcium: 9.1 mg/dL (ref 8.9–10.3)
Chloride: 107 mmol/L (ref 98–111)
Creatinine, Ser: 1.23 mg/dL (ref 0.61–1.24)
GFR, Estimated: >60 mL/min (ref >60)
Glucose, Bld: 184 mg/dL — ABNORMAL HIGH (ref 70–99)
Potassium: 3.9 mmol/L (ref 3.5–5.1)
Sodium: 139 mmol/L (ref 135–145)
Total Bilirubin: 0.5 mg/dL (ref 0.3–1.2)
Total Protein: 6.6 g/dL (ref 6.5–8.1)

## 2022-07-07 MED ORDER — SODIUM CHLORIDE 0.9 % IV SOLN
16.0000 mg/kg | Freq: Once | INTRAVENOUS | Status: AC
  Administered 2022-07-07: 1200 mg via INTRAVENOUS
  Filled 2022-07-07: qty 60

## 2022-07-07 MED ORDER — FAMOTIDINE IN NACL 20-0.9 MG/50ML-% IV SOLN
20.0000 mg | Freq: Once | INTRAVENOUS | Status: AC
  Administered 2022-07-07: 20 mg via INTRAVENOUS
  Filled 2022-07-07: qty 50

## 2022-07-07 MED ORDER — SODIUM CHLORIDE 0.9 % IV SOLN
20.0000 mg | Freq: Once | INTRAVENOUS | Status: AC
  Administered 2022-07-07: 20 mg via INTRAVENOUS
  Filled 2022-07-07: qty 20

## 2022-07-07 MED ORDER — ACETAMINOPHEN 325 MG PO TABS
650.0000 mg | ORAL_TABLET | Freq: Once | ORAL | Status: AC
  Administered 2022-07-07: 650 mg via ORAL
  Filled 2022-07-07: qty 2

## 2022-07-07 MED ORDER — SODIUM CHLORIDE 0.9 % IV SOLN: Freq: Once | INTRAVENOUS | Status: AC

## 2022-07-07 MED ORDER — DIPHENHYDRAMINE HCL 25 MG PO CAPS
50.0000 mg | ORAL_CAPSULE | Freq: Once | ORAL | Status: AC
  Administered 2022-07-07: 50 mg via ORAL
  Filled 2022-07-07: qty 2

## 2022-07-07 NOTE — Patient Instructions (Signed)
[facility] ONCOLOGY  Discharge Instructions: Thank you for choosing [facility] to provide your oncology and hematology care.   If you have a lab appointment with the [facility], please go directly to the [facility] and check in at the registration area.   Wear comfortable clothing and clothing appropriate for easy access to any Portacath or PICC line.   We strive to give you quality time with your provider. You may need to reschedule your appointment if you arrive late (15 or more minutes).  Arriving late affects you and other patients whose appointments are after yours.  Also, if you miss three or more appointments without notifying the office, you may be dismissed from the clinic at the provider's discretion.      For prescription refill requests, have your pharmacy contact our office and allow 72 hours for refills to be completed.    Today you received the following chemotherapy and/or immunotherapy agents: Daratumumab      To help prevent nausea and vomiting after your treatment, we encourage you to take your nausea medication as directed.  BELOW ARE SYMPTOMS THAT SHOULD BE REPORTED IMMEDIATELY: *FEVER GREATER THAN 100.4 F (38 C) OR HIGHER *CHILLS OR SWEATING *NAUSEA AND VOMITING THAT IS NOT CONTROLLED WITH YOUR NAUSEA MEDICATION *UNUSUAL SHORTNESS OF BREATH *UNUSUAL BRUISING OR BLEEDING *URINARY PROBLEMS (pain or burning when urinating, or frequent urination) *BOWEL PROBLEMS (unusual diarrhea, constipation, pain near the anus) TENDERNESS IN MOUTH AND THROAT WITH OR WITHOUT PRESENCE OF ULCERS (sore throat, sores in mouth, or a toothache) UNUSUAL RASH, SWELLING OR PAIN  UNUSUAL VAGINAL DISCHARGE OR ITCHING   Items with * indicate a potential emergency and should be followed up as soon as possible or go to the Emergency Department if any problems should occur.  Please show the CHEMOTHERAPY ALERT CARD or IMMUNOTHERAPY ALERT CARD at check-in  to the Emergency Department and triage nurse.  Should you have questions after your visit or need to cancel or reschedule your appointment, please contact [facility]  Dept: [phone]  and follow the prompts.  Office hours are 8:00 a.m. to 4:30 p.m. Monday - Friday. Please note that voicemails left after 4:00 p.m. may not be returned until the following business day.  We are closed weekends and major holidays. You have access to a nurse at all times for urgent questions. Please call the main number to the clinic Dept: [phone] and follow the prompts.   For any non-urgent questions, you may also contact your provider using MyChart. We now offer e-Visits for anyone 36 and older to request care online for non-urgent symptoms. For details visit [email or web address].   Also download the MyChart app! Go to the app store, search "MyChart", open the app, select [facility], and log in with your MyChart username and password.  Masks are optional in the cancer centers. If you would like for your care team to wear a mask while they are taking care of you, please let them know. You may have one support person who is at least 74 years old accompany you for your appointments.

## 2022-07-11 ENCOUNTER — Other Ambulatory Visit: Payer: Self-pay | Admitting: Internal Medicine

## 2022-07-11 ENCOUNTER — Other Ambulatory Visit: Payer: Self-pay

## 2022-07-11 ENCOUNTER — Other Ambulatory Visit: Payer: Self-pay | Admitting: Hematology

## 2022-07-11 DIAGNOSIS — Z7189 Other specified counseling: Secondary | ICD-10-CM

## 2022-07-11 DIAGNOSIS — C9002 Multiple myeloma in relapse: Secondary | ICD-10-CM

## 2022-07-11 MED ORDER — PREDNISONE 10 MG PO TABS
10.0000 mg | ORAL_TABLET | Freq: Every day | ORAL | 0 refills | Status: AC
  Filled 2022-07-11: qty 21, 21d supply, fill #0

## 2022-07-11 MED ORDER — TECHLITE PEN NEEDLES 32G X 4 MM MISC
1 refills | Status: DC
  Filled 2022-07-11: qty 100, 50d supply, fill #0
  Filled 2022-09-07: qty 100, 50d supply, fill #1

## 2022-07-11 MED ORDER — ACYCLOVIR 400 MG PO TABS
400.0000 mg | ORAL_TABLET | Freq: Two times a day (BID) | ORAL | 11 refills | Status: DC
  Filled 2022-07-11: qty 60, 30d supply, fill #0
  Filled 2022-08-31: qty 60, 30d supply, fill #1
  Filled 2022-11-14: qty 60, 30d supply, fill #2
  Filled 2023-01-29: qty 60, 30d supply, fill #3
  Filled 2023-05-22: qty 60, 30d supply, fill #4

## 2022-07-12 ENCOUNTER — Other Ambulatory Visit: Payer: Medicare Other

## 2022-07-12 ENCOUNTER — Ambulatory Visit: Payer: Medicare Other

## 2022-07-12 ENCOUNTER — Ambulatory Visit: Payer: Medicare Other | Admitting: Hematology

## 2022-07-15 ENCOUNTER — Encounter: Payer: Self-pay | Admitting: Hematology

## 2022-07-17 ENCOUNTER — Other Ambulatory Visit: Payer: Self-pay

## 2022-07-18 ENCOUNTER — Telehealth: Payer: Self-pay | Admitting: Hematology

## 2022-07-18 MED FILL — Dexamethasone Sodium Phosphate Inj 100 MG/10ML: INTRAMUSCULAR | Qty: 2 | Status: AC

## 2022-07-18 NOTE — Telephone Encounter (Signed)
Called patient per 1/9 high priority in basket. R/s patient due to illness. Patient notified.

## 2022-07-19 ENCOUNTER — Ambulatory Visit: Payer: Medicare Other | Admitting: Hematology

## 2022-07-19 ENCOUNTER — Ambulatory Visit: Payer: Medicare Other

## 2022-07-19 ENCOUNTER — Other Ambulatory Visit: Payer: Medicare Other

## 2022-07-20 ENCOUNTER — Other Ambulatory Visit: Payer: Self-pay | Admitting: Internal Medicine

## 2022-07-20 ENCOUNTER — Other Ambulatory Visit: Payer: Self-pay

## 2022-07-20 DIAGNOSIS — C9002 Multiple myeloma in relapse: Secondary | ICD-10-CM

## 2022-07-20 DIAGNOSIS — E1142 Type 2 diabetes mellitus with diabetic polyneuropathy: Secondary | ICD-10-CM

## 2022-07-20 MED ORDER — POMALIDOMIDE 2 MG PO CAPS: 2.0000 mg | ORAL_CAPSULE | Freq: Every day | ORAL | 1 refills | Status: DC

## 2022-07-20 MED ORDER — ACCU-CHEK GUIDE VI STRP
ORAL_STRIP | 2 refills | Status: DC
  Filled 2022-07-20 – 2022-07-21 (×3): qty 100, 33d supply, fill #0
  Filled 2022-08-15: qty 100, 33d supply, fill #1
  Filled 2022-09-06 – 2022-09-11 (×4): qty 100, 33d supply, fill #2

## 2022-07-21 ENCOUNTER — Other Ambulatory Visit: Payer: Self-pay

## 2022-07-24 ENCOUNTER — Other Ambulatory Visit: Payer: Self-pay

## 2022-07-24 ENCOUNTER — Other Ambulatory Visit (HOSPITAL_BASED_OUTPATIENT_CLINIC_OR_DEPARTMENT_OTHER): Payer: Self-pay

## 2022-07-25 ENCOUNTER — Other Ambulatory Visit: Payer: Self-pay

## 2022-07-26 ENCOUNTER — Other Ambulatory Visit: Payer: Medicare Other

## 2022-07-26 ENCOUNTER — Ambulatory Visit: Payer: Medicare Other

## 2022-07-26 ENCOUNTER — Other Ambulatory Visit: Payer: Self-pay

## 2022-07-26 ENCOUNTER — Ambulatory Visit: Payer: Self-pay

## 2022-08-01 MED FILL — Dexamethasone Sodium Phosphate Inj 100 MG/10ML: INTRAMUSCULAR | Qty: 2 | Status: AC

## 2022-08-02 ENCOUNTER — Other Ambulatory Visit: Payer: Self-pay | Admitting: Internal Medicine

## 2022-08-02 ENCOUNTER — Other Ambulatory Visit: Payer: Self-pay | Admitting: Hematology

## 2022-08-02 ENCOUNTER — Other Ambulatory Visit: Payer: Self-pay

## 2022-08-02 ENCOUNTER — Inpatient Hospital Stay: Payer: 59

## 2022-08-02 ENCOUNTER — Ambulatory Visit: Payer: Medicare Other

## 2022-08-02 ENCOUNTER — Other Ambulatory Visit: Payer: Medicare Other

## 2022-08-02 ENCOUNTER — Inpatient Hospital Stay: Payer: 59 | Attending: Hematology | Admitting: Hematology

## 2022-08-02 VITALS — BP 144/68 | HR 75 | Temp 97.5°F | Resp 16 | Ht 67.0 in | Wt 176.6 lb

## 2022-08-02 VITALS — BP 143/66 | HR 68 | Resp 16

## 2022-08-02 DIAGNOSIS — E1122 Type 2 diabetes mellitus with diabetic chronic kidney disease: Secondary | ICD-10-CM | POA: Diagnosis not present

## 2022-08-02 DIAGNOSIS — Z7189 Other specified counseling: Secondary | ICD-10-CM

## 2022-08-02 DIAGNOSIS — I129 Hypertensive chronic kidney disease with stage 1 through stage 4 chronic kidney disease, or unspecified chronic kidney disease: Secondary | ICD-10-CM | POA: Insufficient documentation

## 2022-08-02 DIAGNOSIS — C9002 Multiple myeloma in relapse: Secondary | ICD-10-CM

## 2022-08-02 DIAGNOSIS — N189 Chronic kidney disease, unspecified: Secondary | ICD-10-CM | POA: Insufficient documentation

## 2022-08-02 DIAGNOSIS — Z5112 Encounter for antineoplastic immunotherapy: Secondary | ICD-10-CM | POA: Diagnosis not present

## 2022-08-02 DIAGNOSIS — G620 Drug-induced polyneuropathy: Secondary | ICD-10-CM | POA: Diagnosis not present

## 2022-08-02 DIAGNOSIS — E1169 Type 2 diabetes mellitus with other specified complication: Secondary | ICD-10-CM

## 2022-08-02 DIAGNOSIS — Z79899 Other long term (current) drug therapy: Secondary | ICD-10-CM | POA: Insufficient documentation

## 2022-08-02 LAB — CBC WITH DIFFERENTIAL (CANCER CENTER ONLY)
Abs Immature Granulocytes: 0.02 10*3/uL (ref 0.00–0.07)
Basophils Absolute: 0.1 10*3/uL (ref 0.0–0.1)
Basophils Relative: 2 %
Eosinophils Absolute: 0 10*3/uL (ref 0.0–0.5)
Eosinophils Relative: 1 %
HCT: 31.2 % — ABNORMAL LOW (ref 39.0–52.0)
Hemoglobin: 10.6 g/dL — ABNORMAL LOW (ref 13.0–17.0)
Immature Granulocytes: 0 %
Lymphocytes Relative: 17 %
Lymphs Abs: 0.9 10*3/uL (ref 0.7–4.0)
MCH: 33.8 pg (ref 26.0–34.0)
MCHC: 34 g/dL (ref 30.0–36.0)
MCV: 99.4 fL (ref 80.0–100.0)
Monocytes Absolute: 0.4 10*3/uL (ref 0.1–1.0)
Monocytes Relative: 8 %
Neutro Abs: 3.9 10*3/uL (ref 1.7–7.7)
Neutrophils Relative %: 72 %
Platelet Count: 224 10*3/uL (ref 150–400)
RBC: 3.14 MIL/uL — ABNORMAL LOW (ref 4.22–5.81)
RDW: 14.5 % (ref 11.5–15.5)
WBC Count: 5.4 10*3/uL (ref 4.0–10.5)
nRBC: 0 % (ref 0.0–0.2)

## 2022-08-02 LAB — COMPREHENSIVE METABOLIC PANEL
ALT: 59 U/L — ABNORMAL HIGH (ref 0–44)
AST: 27 U/L (ref 15–41)
Albumin: 3.8 g/dL (ref 3.5–5.0)
Alkaline Phosphatase: 64 U/L (ref 38–126)
Anion gap: 7 (ref 5–15)
BUN: 16 mg/dL (ref 8–23)
CO2: 23 mmol/L (ref 22–32)
Calcium: 9.2 mg/dL (ref 8.9–10.3)
Chloride: 107 mmol/L (ref 98–111)
Creatinine, Ser: 1.32 mg/dL — ABNORMAL HIGH (ref 0.61–1.24)
GFR, Estimated: 57 mL/min — ABNORMAL LOW (ref >60)
Glucose, Bld: 135 mg/dL — ABNORMAL HIGH (ref 70–99)
Potassium: 4.5 mmol/L (ref 3.5–5.1)
Sodium: 137 mmol/L (ref 135–145)
Total Bilirubin: 0.5 mg/dL (ref 0.3–1.2)
Total Protein: 6.6 g/dL (ref 6.5–8.1)

## 2022-08-02 MED ORDER — SODIUM CHLORIDE 0.9 % IV SOLN
16.0000 mg/kg | Freq: Once | INTRAVENOUS | Status: AC
  Administered 2022-08-02: 1200 mg via INTRAVENOUS
  Filled 2022-08-02: qty 60

## 2022-08-02 MED ORDER — FAMOTIDINE IN NACL 20-0.9 MG/50ML-% IV SOLN
20.0000 mg | Freq: Once | INTRAVENOUS | Status: AC
  Administered 2022-08-02: 20 mg via INTRAVENOUS
  Filled 2022-08-02: qty 50

## 2022-08-02 MED ORDER — ACETAMINOPHEN 325 MG PO TABS
650.0000 mg | ORAL_TABLET | Freq: Once | ORAL | Status: AC
  Administered 2022-08-02: 650 mg via ORAL
  Filled 2022-08-02: qty 2

## 2022-08-02 MED ORDER — DIPHENHYDRAMINE HCL 25 MG PO CAPS
50.0000 mg | ORAL_CAPSULE | Freq: Once | ORAL | Status: AC
  Administered 2022-08-02: 50 mg via ORAL
  Filled 2022-08-02: qty 2

## 2022-08-02 MED ORDER — SODIUM CHLORIDE 0.9 % IV SOLN
20.0000 mg | Freq: Once | INTRAVENOUS | Status: AC
  Administered 2022-08-02: 20 mg via INTRAVENOUS
  Filled 2022-08-02: qty 20

## 2022-08-02 MED ORDER — PRAVASTATIN SODIUM 20 MG PO TABS
20.0000 mg | ORAL_TABLET | Freq: Every day | ORAL | 0 refills | Status: DC
  Filled 2022-08-02: qty 90, 90d supply, fill #0

## 2022-08-02 MED ORDER — SODIUM CHLORIDE 0.9 % IV SOLN: Freq: Once | INTRAVENOUS | Status: AC

## 2022-08-02 NOTE — Progress Notes (Signed)
Patient seen by MD today  Vitals are within treatment parameters.  Labs reviewed: and are within treatment parameters." Dr Irene Limbo aware Cr 1.32  Per physician team, patient is ready for treatment and there are NO modifications to the treatment plan.

## 2022-08-02 NOTE — Telephone Encounter (Signed)
Requested medication (s) are due for refill today: yes  Requested medication (s) are on the active medication list: yes  Last refill:  08/29/21 #90 2 refills  Future visit scheduled: yes in 2 months   Notes to clinic:  protocol failed last labs 04/25/19. Do you want to refill ?     Requested Prescriptions  Pending Prescriptions Disp Refills   pravastatin (PRAVACHOL) 20 MG tablet 90 tablet 2    Sig: TAKE 1 TABLET (20 MG TOTAL) BY MOUTH DAILY.     Cardiovascular:  Antilipid - Statins Failed - 08/02/2022  7:34 AM      Failed - Lipid Panel in normal range within the last 12 months    Cholesterol, Total  Date Value Ref Range Status  04/25/2019 184 100 - 199 mg/dL Final   LDL Chol Calc (NIH)  Date Value Ref Range Status  04/25/2019 76 0 - 99 mg/dL Final   HDL  Date Value Ref Range Status  04/25/2019 88 >39 mg/dL Final   Triglycerides  Date Value Ref Range Status  04/25/2019 114 0 - 149 mg/dL Final         Passed - Patient is not pregnant      Passed - Valid encounter within last 12 months    Recent Outpatient Visits           1 month ago Type 2 diabetes mellitus with peripheral neuropathy [facility])   [place], Brimson L, RPH-CPP   2 months ago Type 2 diabetes mellitus with peripheral neuropathy [facility])   [place] Karle Plumber B, MD   4 months ago Type 2 diabetes mellitus with peripheral neuropathy [facility])   [place], [place] L, RPH-CPP   5 months ago Type 2 diabetes mellitus with peripheral neuropathy [facility])   [place], Kerby L, RPH-CPP   7 months ago Type 2 diabetes mellitus with peripheral neuropathy [facility])   [place], MD       Future Appointments             In 1 month Wynetta Emery, Dalbert Batman, MD [place]

## 2022-08-02 NOTE — Progress Notes (Signed)
HEMATOLOGY/ONCOLOGY CLINIC NOTE  Date of Service: 08/02/22   Patient Care Team: [name], MD as PCP - General (Internal Medicine)  CHIEF COMPLAINTS/PURPOSE OF CONSULTATION:  Follow-up for continued evaluation and management of relapsed multiple myeloma  Oncologic History:  77 y.o. male with diagnosis of IgA lambda active multiple myeloma, ISS Stage I. Active disease diagnosed based on presence of anemia, kidney insufficiency, and paraproteinemia with significant predominance of lambda light chains as well as significant elevation of IgA. Bone marrow biopsy confirmed presence of atypical monoclonal plasma cell process in the bone marrow comprising at least 7% of the cellularity. Based on the findings, patient was started on treatment with lenalidomide, bortezomib, and low-dose dexamethasone based on the anticipated tolerance by the patient. Lenalidomide was started at 10 mg daily based on creatinine clearance of 42, dexamethasone dose was reduced to 20 mg weekly based on patient's age. Treatment was complicated by rapid development of cutaneous rash attributed to lenalidomide, inaddition to decreased renal function and now patient has persistent severe anemia. Side-effects were attributed to Lenalidomide and lenalidomide was discontinued with subsequent recovery.  Patient has been receiving bortezomib weekly with low-dose dexamethasone in 4-day cycles.   Patient has completed induction systemic therapy for his disease with repeat bone marrow biopsy confirming complete response including no evidence of minimal residual disease by cytogenetics or FISH.  HISTORY OF PRESENTING ILLNESS:  Please see previous note for details of initial presentation.  [name] is a 77 y.o. male is here for continued evaluation and management of his relapsed multiple myeloma.   Patient was last seen by me on 06/14/2022 and he complained of mild neck pain/discomfort and  mild dizziness due to Pomalidomide.  Patient report he has been doing well overall without any new medical concerns since our last visit. He reports of occasional diarrhea and constipation. He denies fever, chills, night sweats, shortness of breath, appetite loss, abdominal pain chest pain, or leg swelling. However, he does complain of back and hip pain with movement. He takes Tylenol for his back pain, which helps relieve his symptom.   MEDICAL HISTORY:  Past Medical History:  Diagnosis Date   Anemia    Arthritis    shoulders,feet    Cataract    per eye dr -has appt 5-11   CKD (chronic kidney disease), stage III (HCC)    Elevated PSA    History of ketoacidosis    03-29-2015    History of sepsis    07-25-2013  non-compliant w/ medication   Hyperlipidemia    Hypertension    Nocturia    Peripheral neuropathy    Prostate cancer ([place])    Type 2 diabetes mellitus with insulin therapy [facility])     SURGICAL HISTORY: Past Surgical History:  Procedure Laterality Date   CIRCUMCISION  1990's   PROSTATE BIOPSY N/A 12/21/2015   Procedure: BIOPSY TRANSRECTAL ULTRASONIC PROSTATE (TUBP);  Surgeon: Festus Aloe, MD;  Location: [facility];  Service: Urology;  Laterality: N/A;    SOCIAL HISTORY: Social History   Socioeconomic History   Marital status: Single    Spouse name: Not on file   Number of children: Not on file   Years of education: Not on file   Highest education level: Not on file  Occupational History   Not on file  Tobacco Use   Smoking status: Every Day    Packs/day: 0.50    Years: 50.00    Total pack  years: 25.00    Types: Cigarettes   Smokeless tobacco: Never   Tobacco comments:    1 ppwk-4-5 a day   Vaping Use   Vaping Use: Never used  Substance and Sexual Activity   Alcohol use: Yes    Alcohol/week: 1.0 standard drink of alcohol    Types: 1 Cans of beer per week    Comment: 1 every day-quit couple weeks ago   Drug use: No   Sexual activity:  Not Currently  Other Topics Concern   Not on file  Social History Narrative   Not on file   Social Determinants of Health   Financial Resource Strain: Not on file  Food Insecurity: Not on file  Transportation Needs: Not on file  Physical Activity: Not on file  Stress: Not on file  Social Connections: Not on file  Intimate Partner Violence: Not on file    FAMILY HISTORY: Family History  Problem Relation Age of Onset   Kidney disease Mother    Cancer Neg Hx    Colon cancer Neg Hx    Colon polyps Neg Hx    Esophageal cancer Neg Hx    Rectal cancer Neg Hx    Stomach cancer Neg Hx     ALLERGIES:  is allergic to other and lisinopril.  MEDICATIONS:  Current Outpatient Medications  Medication Sig Dispense Refill   Accu-Chek Softclix Lancets lancets Use to test blood sugar 3 times daily. 100 each 3   acyclovir (ZOVIRAX) 400 MG tablet Take 1 tablet (400 mg total) by mouth 2 (two) times daily. 60 tablet 11   amLODipine (NORVASC) 10 MG tablet TAKE 1 TABLET (10 MG TOTAL) BY MOUTH DAILY. 90 tablet 3   aspirin EC 81 MG tablet Take 81 mg by mouth daily. Swallow whole.     b complex vitamins capsule Take 1 capsule by mouth daily. 30 capsule 5   Blood Glucose Monitoring Suppl (ACCU-CHEK GUIDE) w/Device KIT Use to test blood sugar three times daily 1 kit 0   Continuous Blood Gluc Receiver (FREESTYLE LIBRE READER) DEVI Use to check blood sugar 3x daily. E11.42 1 each 0   Continuous Blood Gluc Sensor (FREESTYLE LIBRE SENSOR SYSTEM) MISC Use to check blood sugar 3 times daily. Change sensor every 2 weeks. 2 each 12   dexamethasone (DECADRON) 4 MG tablet '20mg'$  (5 tabs) Take the day after each dose of daratumumab. Take with breakfast 20 tablet 11   diclofenac Sodium (VOLTAREN) 1 % GEL Apply 4 g topically 4 (four) times daily. 150 g 5   donepezil (ARICEPT) 5 MG tablet TAKE 1 TABLET (5 MG TOTAL) BY MOUTH AT BEDTIME. FOR MEMORY ISSUES 30 tablet 5   ferrous sulfate 325 (65 FE) MG tablet Take 325 mg  by mouth daily.     gabapentin (NEURONTIN) 300 MG capsule TAKE 1 CAPSULE BY MOUTH IN THE MORNING AND LUNCH AND 2 AT BEDTIME 120 capsule 6   glucose blood (ACCU-CHEK GUIDE) test strip Use to test blood sugar 3 times daily. 100 each 2   hydrOXYzine (ATARAX) 25 MG tablet TAKE 1 TABLET (25 MG TOTAL) BY MOUTH 3 (THREE) TIMES DAILY AS NEEDED FOR ITCHING (RASH). 30 tablet 0   Insulin Lispro Prot & Lispro (HUMALOG MIX 75/25 KWIKPEN) (75-25) 100 UNIT/ML Kwikpen inject 8 units subcutaneously with breakfast and dinner. 15 mL 11   Insulin Pen Needle (TECHLITE PEN NEEDLES) 32G X 4 MM MISC USE AS DIRECTED TWICE DAILY WITH INSULIN 100 each 1   Miconazole Nitrate (  ATHLETES FOOT POWDER SPRAY) 2 % AERP Spray between toes and under toes once daily. 130 g 4   Multiple Vitamin (MULTIVITAMIN WITH MINERALS) TABS tablet Take 1 tablet by mouth daily. 60 tablet 2   ondansetron (ZOFRAN) 8 MG tablet Take 1 tablet (8 mg total) by mouth 2 (two) times daily as needed (Nausea or vomiting). 30 tablet 1   polyethylene glycol (MIRALAX) 17 g packet Take 17 g by mouth daily. 30 each 2   pomalidomide (POMALYST) 2 MG capsule Take 1 capsule (2 mg total) by mouth daily. 3 weeks on 1 week off 21 capsule 1   pravastatin (PRAVACHOL) 20 MG tablet TAKE 1 TABLET (20 MG TOTAL) BY MOUTH DAILY. 90 tablet 2   prochlorperazine (COMPAZINE) 10 MG tablet Take 1 tablet (10 mg total) by mouth every 6 (six) hours as needed (Nausea or vomiting). 30 tablet 1   tadalafil (CIALIS) 20 MG tablet Take 20 mg by mouth as needed.     tiZANidine (ZANAFLEX) 2 MG tablet Take 1 tablet (2 mg total) by mouth at bedtime as needed for muscle spasms. 30 tablet 1   valsartan (DIOVAN) 40 MG tablet Take 1 tablet (40 mg total) by mouth daily. STOP LISINOPRIL 90 tablet 3   vitamin B-12 (CYANOCOBALAMIN) 100 MCG tablet Take 100 mcg by mouth daily.     No current facility-administered medications for this visit.    ROS .10 Point review of Systems was done is negative except as  noted above.  PHYSICAL EXAMINATION: .BP (!) 144/68 (BP Location: Left Arm, Patient Position: Sitting)   Pulse 75   Temp (!) 97.5 F (36.4 C) (Temporal)   Resp 16   Ht '5\' 7"'$  (1.702 m)   Wt 176 lb 9.6 oz (80.1 kg)   SpO2 100%   BMI 27.66 kg/m  . GENERAL:alert, in no acute distress and comfortable SKIN: no acute rashes, no significant lesions EYES: conjunctiva are pink and non-injected, sclera anicteric OROPHARYNX: MMM, no exudates, no oropharyngeal erythema or ulceration NECK: supple, no JVD LYMPH:  no palpable lymphadenopathy in the cervical, axillary or inguinal regions LUNGS: clear to auscultation b/l with normal respiratory effort HEART: regular rate & rhythm ABDOMEN:  normoactive bowel sounds , non tender, not distended. Extremity: no pedal edema PSYCH: alert & oriented x 3 with fluent speech NEURO: no focal motor/sensory deficits   LABORATORY DATA:  I have reviewed the data as listed  .    Latest Ref Rng & Units 08/02/2022   11:01 AM 07/07/2022   10:21 AM 06/14/2022   10:09 AM  CBC  WBC 4.0 - 10.5 K/uL 5.4  4.7  6.8   Hemoglobin 13.0 - 17.0 g/dL 10.6  11.4  11.1   Hematocrit 39.0 - 52.0 % 31.2  32.9  32.7   Platelets 150 - 400 K/uL 224  180  193       Latest Ref Rng & Units 08/02/2022   11:01 AM 07/07/2022   10:21 AM 06/14/2022   10:09 AM  CMP  Glucose 70 - 99 mg/dL 135  184  213   BUN 8 - 23 mg/dL '16  21  22   '$ Creatinine 0.61 - 1.24 mg/dL 1.32  1.23  1.47   Sodium 135 - 145 mmol/L 137  139  138   Potassium 3.5 - 5.1 mmol/L 4.5  3.9  3.7   Chloride 98 - 111 mmol/L 107  107  106   CO2 22 - 32 mmol/L 23  25  22  Calcium 8.9 - 10.3 mg/dL 9.2  9.1  9.5   Total Protein 6.5 - 8.1 g/dL 6.6  6.6  6.4   Total Bilirubin 0.3 - 1.2 mg/dL 0.5  0.5  0.7   Alkaline Phos 38 - 126 U/L 64  65  78   AST 15 - 41 U/L '27  24  21   '$ ALT 0 - 44 U/L 59  53  35      08/29/17 BM Bx:    08/29/17 Cytogenetics:   03/13/17 Cytogenetics:        PATHOLOGY Surgical Pathology   CASE: [id number]  PATIENT: Juergen Regan  Bone Marrow Report      Clinical History: Multiple myeloma in relapse ([place]) ,([facility])      DIAGNOSIS:   BONE MARROW, ASPIRATE, CLOT, CORE:  -Hypercellular bone marrow for age with plasma cell neoplasm  -See comment   PERIPHERAL BLOOD:  -Mild normocytic-normochromic anemia  -Eosinophilia   COMMENT:   The bone marrow is hypercellular for age with increased number of plasma  cells representing 8% of all cells in the aspirate associated with  numerous small clusters in the clot and biopsy sections. The plasma  cells display lambda light chain restriction consistent with plasma cell  neoplasm.  The background shows trilineage hematopoiesis with generally  nonspecific myeloid changes, likely secondary in nature in this setting.  Correlation with cytogenetic and FISH studies is recommended.      RADIOGRAPHIC STUDIES: I have personally reviewed the radiological images as listed and agreed with the findings in the report. No results found.  ASSESSMENT & PLAN:   77 y.o. male with   1.  Relapsed IgA Lambda Multiple Myeloma ISS Stage I    Active disease was previously diagnosed based on presence of anemia, kidney insufficiency, and paraproteinemia with significant predominance of lambda light chains as well as significant elevation of IgA. Patient is status post patient was treated with induction bortezomib Revlimid dexamethasone.  Had issues with skin rashes with Revlimid and this was discontinued.  Completed bortezomib dexamethasone induction and achieved complete remission. Was on maintenance Velcade 2 weeks which was then switched to maintenance Ninlaro. Patient had issues with worsening neuropathy which was a combination of Velcade and his diabetes.  Ninlaro was discontinued as well after maintenance of more than 2 years.  2.  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 Bone marrow biopsy shows 8% lambda restricted plasma cells PET CT scan with no hypermetabolic osseous lesions  3.  Hypertension 4.  Diabetes type 2 5 .chronic kidney disease due to hypertension and diabetes. 6.  Peripheral neuropathy related to diabetes and previous myeloma treatments.  Plan: -Discussed lab results from today, 08/02/2022, with the patient. CBC shows hemoglobin of 10.6 and hematocrit of 31.2. CMP shows creatinine of 1.32 and ALT of 59. -Patient is tolerating his treatment well without any toxicities.   -No new focal symptoms related to multiple myeloma. -We will continue treatment without any dose modifications. Treatment orders reviewed and placed. -No toxicities with Daratumumab and Pomalidomide.   Follow up: Per integrated scheduling  The total time spent in the appointment was 30 minutes* .  All of the patient's questions were answered with apparent satisfaction. The patient knows to call the clinic with any problems, questions or concerns.   Sullivan Lone MD MS AAHIVMS [facility] Hematology/Oncology Physician [facility]  .*Total Encounter Time as defined by  the Centers for Medicare and Medicaid Services includes, in addition to the face-to-face time of a patient visit (documented in the note above) non-face-to-face time: obtaining and reviewing outside history, ordering and reviewing medications, tests or procedures, care coordination (communications with other health care professionals or caregivers) and documentation in the medical record.   I, Cleda Mccreedy, am acting as a Education administrator for Sullivan Lone, MD. .I have reviewed the above documentation for accuracy and completeness, and I agree with the above. Brunetta Genera MD

## 2022-08-02 NOTE — Patient Instructions (Signed)
[place]  Discharge Instructions: Thank you for choosing [facility] to provide your oncology and hematology care.   If you have a lab appointment with the [facility], please go directly to the [facility] and check in at the registration area.   Wear comfortable clothing and clothing appropriate for easy access to any Portacath or PICC line.   We strive to give you quality time with your provider. You may need to reschedule your appointment if you arrive late (15 or more minutes).  Arriving late affects you and other patients whose appointments are after yours.  Also, if you miss three or more appointments without notifying the office, you may be dismissed from the clinic at the provider's discretion.      For prescription refill requests, have your pharmacy contact our office and allow 72 hours for refills to be completed.    Today you received the following chemotherapy and/or immunotherapy agents: Daratumumab      To help prevent nausea and vomiting after your treatment, we encourage you to take your nausea medication as directed.  BELOW ARE SYMPTOMS THAT SHOULD BE REPORTED IMMEDIATELY: *FEVER GREATER THAN 100.4 F (38 C) OR HIGHER *CHILLS OR SWEATING *NAUSEA AND VOMITING THAT IS NOT CONTROLLED WITH YOUR NAUSEA MEDICATION *UNUSUAL SHORTNESS OF BREATH *UNUSUAL BRUISING OR BLEEDING *URINARY PROBLEMS (pain or burning when urinating, or frequent urination) *BOWEL PROBLEMS (unusual diarrhea, constipation, pain near the anus) TENDERNESS IN MOUTH AND THROAT WITH OR WITHOUT PRESENCE OF ULCERS (sore throat, sores in mouth, or a toothache) UNUSUAL RASH, SWELLING OR PAIN  UNUSUAL VAGINAL DISCHARGE OR ITCHING   Items with * indicate a potential emergency and should be followed up as soon as possible or go to the Emergency Department if any problems should occur.  Please show the CHEMOTHERAPY ALERT CARD or IMMUNOTHERAPY ALERT CARD at  check-in to the Emergency Department and triage nurse.  Should you have questions after your visit or need to cancel or reschedule your appointment, please contact [facility]  Dept: [phone]  and follow the prompts.  Office hours are 8:00 a.m. to 4:30 p.m. Monday - Friday. Please note that voicemails left after 4:00 p.m. may not be returned until the following business day.  We are closed weekends and major holidays. You have access to a nurse at all times for urgent questions. Please call the main number to the clinic Dept: [phone] and follow the prompts.   For any non-urgent questions, you may also contact your provider using MyChart. We now offer e-Visits for anyone 55 and older to request care online for non-urgent symptoms. For details visit [email or web address].   Also download the MyChart app! Go to the app store, search "MyChart", open the app, select , and log in with your MyChart username and password.  Masks are optional in the cancer centers. If you would like for your care team to wear a mask while they are taking care of you, please let them know. You may have one support person who is at least 75 years old accompany you for your appointments.

## 2022-08-03 ENCOUNTER — Other Ambulatory Visit: Payer: Self-pay

## 2022-08-04 ENCOUNTER — Other Ambulatory Visit: Payer: Self-pay

## 2022-08-04 ENCOUNTER — Encounter: Payer: Self-pay | Admitting: Hematology

## 2022-08-07 ENCOUNTER — Other Ambulatory Visit: Payer: Self-pay

## 2022-08-08 ENCOUNTER — Encounter: Payer: Self-pay | Admitting: Hematology

## 2022-08-08 LAB — MULTIPLE MYELOMA PANEL, SERUM
Albumin SerPl Elph-Mcnc: 3.8 g/dL (ref 2.9–4.4)
Albumin/Glob SerPl: 1.7 (ref 0.7–1.7)
Alpha 1: 0.2 g/dL (ref 0.0–0.4)
Alpha2 Glob SerPl Elph-Mcnc: 0.8 g/dL (ref 0.4–1.0)
B-Globulin SerPl Elph-Mcnc: 0.7 g/dL (ref 0.7–1.3)
Gamma Glob SerPl Elph-Mcnc: 0.6 g/dL (ref 0.4–1.8)
Globulin, Total: 2.3 g/dL (ref 2.2–3.9)
IgA: 117 mg/dL (ref 61–437)
IgG (Immunoglobin G), Serum: 667 mg/dL (ref 603–1613)
IgM (Immunoglobulin M), Srm: 34 mg/dL (ref 15–143)
Total Protein ELP: 6.1 g/dL (ref 6.0–8.5)

## 2022-08-09 ENCOUNTER — Ambulatory Visit: Payer: Self-pay

## 2022-08-09 ENCOUNTER — Other Ambulatory Visit: Payer: Self-pay

## 2022-08-09 ENCOUNTER — Ambulatory Visit: Payer: Medicare Other | Admitting: Hematology

## 2022-08-09 ENCOUNTER — Ambulatory Visit: Payer: Medicare Other

## 2022-08-09 ENCOUNTER — Other Ambulatory Visit: Payer: Medicare Other

## 2022-08-15 ENCOUNTER — Other Ambulatory Visit: Payer: Self-pay

## 2022-08-15 MED FILL — Dexamethasone Sodium Phosphate Inj 100 MG/10ML: INTRAMUSCULAR | Qty: 2 | Status: AC

## 2022-08-16 ENCOUNTER — Inpatient Hospital Stay: Payer: 59 | Attending: Hematology

## 2022-08-16 ENCOUNTER — Ambulatory Visit: Payer: Medicare Other | Admitting: Hematology

## 2022-08-16 ENCOUNTER — Other Ambulatory Visit: Payer: Self-pay

## 2022-08-16 ENCOUNTER — Other Ambulatory Visit: Payer: Medicare Other

## 2022-08-16 ENCOUNTER — Ambulatory Visit: Payer: Medicare Other

## 2022-08-16 ENCOUNTER — Inpatient Hospital Stay: Payer: 59

## 2022-08-16 VITALS — BP 134/67 | HR 76 | Temp 98.1°F | Resp 18 | Wt 175.2 lb

## 2022-08-16 DIAGNOSIS — C9002 Multiple myeloma in relapse: Secondary | ICD-10-CM | POA: Insufficient documentation

## 2022-08-16 DIAGNOSIS — Z5112 Encounter for antineoplastic immunotherapy: Secondary | ICD-10-CM | POA: Insufficient documentation

## 2022-08-16 DIAGNOSIS — Z7189 Other specified counseling: Secondary | ICD-10-CM

## 2022-08-16 LAB — COMPREHENSIVE METABOLIC PANEL
ALT: 18 U/L (ref 0–44)
AST: 15 U/L (ref 15–41)
Albumin: 3.6 g/dL (ref 3.5–5.0)
Alkaline Phosphatase: 88 U/L (ref 38–126)
Anion gap: 6 (ref 5–15)
BUN: 16 mg/dL (ref 8–23)
CO2: 22 mmol/L (ref 22–32)
Calcium: 8.9 mg/dL (ref 8.9–10.3)
Chloride: 106 mmol/L (ref 98–111)
Creatinine, Ser: 1.4 mg/dL — ABNORMAL HIGH (ref 0.61–1.24)
GFR, Estimated: 53 mL/min — ABNORMAL LOW (ref >60)
Glucose, Bld: 206 mg/dL — ABNORMAL HIGH (ref 70–99)
Potassium: 4 mmol/L (ref 3.5–5.1)
Sodium: 134 mmol/L — ABNORMAL LOW (ref 135–145)
Total Bilirubin: 0.4 mg/dL (ref 0.3–1.2)
Total Protein: 5.9 g/dL — ABNORMAL LOW (ref 6.5–8.1)

## 2022-08-16 LAB — CBC WITH DIFFERENTIAL (CANCER CENTER ONLY)
Abs Immature Granulocytes: 0.02 10*3/uL (ref 0.00–0.07)
Basophils Absolute: 0.1 10*3/uL (ref 0.0–0.1)
Basophils Relative: 2 %
Eosinophils Absolute: 0.3 10*3/uL (ref 0.0–0.5)
Eosinophils Relative: 6 %
HCT: 30.1 % — ABNORMAL LOW (ref 39.0–52.0)
Hemoglobin: 10.4 g/dL — ABNORMAL LOW (ref 13.0–17.0)
Immature Granulocytes: 1 %
Lymphocytes Relative: 20 %
Lymphs Abs: 0.8 10*3/uL (ref 0.7–4.0)
MCH: 34.1 pg — ABNORMAL HIGH (ref 26.0–34.0)
MCHC: 34.6 g/dL (ref 30.0–36.0)
MCV: 98.7 fL (ref 80.0–100.0)
Monocytes Absolute: 0.4 10*3/uL (ref 0.1–1.0)
Monocytes Relative: 11 %
Neutro Abs: 2.5 10*3/uL (ref 1.7–7.7)
Neutrophils Relative %: 60 %
Platelet Count: 247 10*3/uL (ref 150–400)
RBC: 3.05 MIL/uL — ABNORMAL LOW (ref 4.22–5.81)
RDW: 14.5 % (ref 11.5–15.5)
WBC Count: 4.1 10*3/uL (ref 4.0–10.5)
nRBC: 0 % (ref 0.0–0.2)

## 2022-08-16 MED ORDER — SODIUM CHLORIDE 0.9 % IV SOLN
20.0000 mg | Freq: Once | INTRAVENOUS | Status: AC
  Administered 2022-08-16: 20 mg via INTRAVENOUS
  Filled 2022-08-16: qty 20

## 2022-08-16 MED ORDER — SODIUM CHLORIDE 0.9 % IV SOLN
16.0000 mg/kg | Freq: Once | INTRAVENOUS | Status: AC
  Administered 2022-08-16: 1200 mg via INTRAVENOUS
  Filled 2022-08-16: qty 60

## 2022-08-16 MED ORDER — SODIUM CHLORIDE 0.9 % IV SOLN: Freq: Once | INTRAVENOUS | Status: AC

## 2022-08-16 MED ORDER — DIPHENHYDRAMINE HCL 25 MG PO CAPS
50.0000 mg | ORAL_CAPSULE | Freq: Once | ORAL | Status: AC
  Administered 2022-08-16: 50 mg via ORAL
  Filled 2022-08-16: qty 2

## 2022-08-16 MED ORDER — FAMOTIDINE IN NACL 20-0.9 MG/50ML-% IV SOLN
20.0000 mg | Freq: Once | INTRAVENOUS | Status: AC
  Administered 2022-08-16: 20 mg via INTRAVENOUS
  Filled 2022-08-16: qty 50

## 2022-08-16 MED ORDER — ACETAMINOPHEN 325 MG PO TABS
650.0000 mg | ORAL_TABLET | Freq: Once | ORAL | Status: AC
  Administered 2022-08-16: 650 mg via ORAL
  Filled 2022-08-16: qty 2

## 2022-08-16 NOTE — Patient Instructions (Signed)
[facility]  Discharge Instructions: Thank you for choosing [facility] to provide your oncology and hematology care.   If you have a lab appointment with the Cancer Center, please go directly to the Cancer Center and check in at the registration area.   Wear comfortable clothing and clothing appropriate for easy access to any Portacath or PICC line.   We strive to give you quality time with your provider. You may need to reschedule your appointment if you arrive late (15 or more minutes).  Arriving late affects you and other patients whose appointments are after yours.  Also, if you miss three or more appointments without notifying the office, you may be dismissed from the clinic at the provider's discretion.      For prescription refill requests, have your pharmacy contact our office and allow 72 hours for refills to be completed.    Today you received the following chemotherapy and/or immunotherapy agents: Darzalex    To help prevent nausea and vomiting after your treatment, we encourage you to take your nausea medication as directed.  BELOW ARE SYMPTOMS THAT SHOULD BE REPORTED IMMEDIATELY: *FEVER GREATER THAN 100.4 F (38 C) OR HIGHER *CHILLS OR SWEATING *NAUSEA AND VOMITING THAT IS NOT CONTROLLED WITH YOUR NAUSEA MEDICATION *UNUSUAL SHORTNESS OF BREATH *UNUSUAL BRUISING OR BLEEDING *URINARY PROBLEMS (pain or burning when urinating, or frequent urination) *BOWEL PROBLEMS (unusual diarrhea, constipation, pain near the anus) TENDERNESS IN MOUTH AND THROAT WITH OR WITHOUT PRESENCE OF ULCERS (sore throat, sores in mouth, or a toothache) UNUSUAL RASH, SWELLING OR PAIN  UNUSUAL VAGINAL DISCHARGE OR ITCHING   Items with * indicate a potential emergency and should be followed up as soon as possible or go to the Emergency Department if any problems should occur.  Please show the CHEMOTHERAPY ALERT CARD or IMMUNOTHERAPY ALERT CARD at check-in  to the Emergency Department and triage nurse.  Should you have questions after your visit or need to cancel or reschedule your appointment, please contact [facility]  Dept: [phone]  and follow the prompts.  Office hours are 8:00 a.m. to 4:30 p.m. Monday - Friday. Please note that voicemails left after 4:00 p.m. may not be returned until the following business day.  We are closed weekends and major holidays. You have access to a nurse at all times for urgent questions. Please call the main number to the clinic Dept: [phone] and follow the prompts.   For any non-urgent questions, you may also contact your provider using MyChart. We now offer e-Visits for anyone 18 and older to request care online for non-urgent symptoms. For details visit [email or web address].   Also download the MyChart app! Go to the app store, search "MyChart", open the app, select [place], and log in with your MyChart username and password.   

## 2022-08-18 ENCOUNTER — Other Ambulatory Visit: Payer: Self-pay

## 2022-08-18 DIAGNOSIS — C9002 Multiple myeloma in relapse: Secondary | ICD-10-CM

## 2022-08-18 MED ORDER — POMALIDOMIDE 2 MG PO CAPS: 2.0000 mg | ORAL_CAPSULE | Freq: Every day | ORAL | 1 refills | Status: DC

## 2022-08-20 ENCOUNTER — Other Ambulatory Visit: Payer: Self-pay

## 2022-08-23 ENCOUNTER — Ambulatory Visit: Payer: Self-pay

## 2022-08-23 ENCOUNTER — Ambulatory Visit: Payer: Medicare Other

## 2022-08-23 ENCOUNTER — Other Ambulatory Visit: Payer: Medicare Other

## 2022-08-23 ENCOUNTER — Other Ambulatory Visit: Payer: Self-pay

## 2022-08-23 LAB — MULTIPLE MYELOMA PANEL, SERUM
Albumin SerPl Elph-Mcnc: 3.3 g/dL (ref 2.9–4.4)
Albumin/Glob SerPl: 1.4 (ref 0.7–1.7)
Alpha 1: 0.3 g/dL (ref 0.0–0.4)
Alpha2 Glob SerPl Elph-Mcnc: 0.8 g/dL (ref 0.4–1.0)
B-Globulin SerPl Elph-Mcnc: 0.8 g/dL (ref 0.7–1.3)
Gamma Glob SerPl Elph-Mcnc: 0.6 g/dL (ref 0.4–1.8)
Globulin, Total: 2.4 g/dL (ref 2.2–3.9)
IgA: 125 mg/dL (ref 61–437)
IgG (Immunoglobin G), Serum: 516 mg/dL — ABNORMAL LOW (ref 603–1613)
IgM (Immunoglobulin M), Srm: 28 mg/dL (ref 15–143)
Total Protein ELP: 5.7 g/dL — ABNORMAL LOW (ref 6.0–8.5)

## 2022-08-25 ENCOUNTER — Ambulatory Visit (INDEPENDENT_AMBULATORY_CARE_PROVIDER_SITE_OTHER): Payer: 59

## 2022-08-25 ENCOUNTER — Ambulatory Visit (INDEPENDENT_AMBULATORY_CARE_PROVIDER_SITE_OTHER): Payer: 59 | Admitting: Orthopedic Surgery

## 2022-08-25 VITALS — BP 139/69 | HR 86 | Ht 67.0 in | Wt 175.0 lb

## 2022-08-25 DIAGNOSIS — M542 Cervicalgia: Secondary | ICD-10-CM | POA: Diagnosis not present

## 2022-08-25 DIAGNOSIS — G8929 Other chronic pain: Secondary | ICD-10-CM

## 2022-08-25 DIAGNOSIS — N182 Chronic kidney disease, stage 2 (mild): Secondary | ICD-10-CM | POA: Diagnosis not present

## 2022-08-25 DIAGNOSIS — M5442 Lumbago with sciatica, left side: Secondary | ICD-10-CM | POA: Diagnosis not present

## 2022-08-25 DIAGNOSIS — E559 Vitamin D deficiency, unspecified: Secondary | ICD-10-CM | POA: Diagnosis not present

## 2022-08-25 NOTE — Progress Notes (Signed)
Orthopedic Spine Surgery Office Note  Assessment: Patient is a 77 y.o. male with 2 issues.  He has chronic neck pain with no radicular symptoms.  He has no symptoms of myelopathy.  He does have right hand numbness and paresthesias that seems consistent with carpal tunnel syndrome.  His other issue is chronic low back pain that radiates into the left posterior and lateral aspect of the hip.    Plan: -Explained that initially conservative treatment is tried as a significant number of patients may experience relief with these treatment modalities. Discussed that the conservative treatments include:  -activity modification  -physical therapy  -over the counter pain medications  -medrol dosepak  -cervical steroid injections -Patient has tried NSAIDs and Tylenol -Recommended physical therapy and Voltaren gel over the affected area.  Can continue with over-the-counter medications -Would need to be nicotine free and his A1c will have to be 7.5 or less if surgery is ever considered as a treatment option -Will order an MRI of his lumbar spine at next visit if he does not get improvement with PT -Patient should return to office in 6 weeks, x-rays at next visit: none   Patient expressed understanding of the plan and all questions were answered to the patient's satisfaction.   ___________________________________________________________________________   History:  Patient is a 76 y.o. male who presents today for cervical and lumbar spine.  Patient has had over 1 year of neck pain.  He feels it in the lower aspect of his neck.  It is worse with activity and improves with rest.  It does not go away with rest.  There is no trauma or injury that brought on the pain.  It does not radiate into either upper extremity.  He has had chronic imbalance issues and uses a cane.  He has had no recent changes.  He has had no issues with fine motor skills in his hands.  He does get numbness and paresthesias into his  right hand especially at night.  He says if he shakes it out in the morning it goes away.  No other numbness or paresthesias in his upper extremities.  In regards to his lower back, he has had pain for several years.  It is gotten progressively worse with time.  He feels it starts in his lower back and radiates into his left posterior and lateral hip.  It does not radiate distal to that location.  He does not have any radiating pain distal to the hip.  He does not have any radiating pain on the right side.  There is no trauma or injury that brought on this pain.  He denies paresthesias and numbness.   Weakness: denies Difficulty with fine motor skills (e.g., buttoning shirts, handwriting): denies Symptoms of imbalance: yes, chronic and no recent changes Paresthesias and numbness: yes into right hand. No other numbness or paresthesias Bowel or bladder incontinence: denies Saddle anesthesia: denies  Treatments tried: NSAIDs and Tylenol  Review of systems: Denies fevers and chills, night sweats, unexplained weight loss, history of cancer, pain that wakes them at night  Past medical history: Bowel syndrome Osteoarthritis Chronic kidney disease Hypertension HLD DM2 (last A1c was 7.9 on 05/08/2022)  Allergies: Lisinopril  Past surgical history:  Prostate biopsy   Social history: Reports use of nicotine product (smoking, vaping, patches, smokeless) Alcohol use: denies Denies recreational drug use  Physical Exam:  BMI of 27.4  General: no acute distress, appears stated age Neurologic: alert, answering questions appropriately, following commands Respiratory:  unlabored breathing on room air, symmetric chest rise Psychiatric: appropriate affect, normal cadence to speech   MSK (spine):  -Strength exam      Left  Right Grip strength                5/5  5/5 Interosseus   5/5   5/5 Wrist extension  5/5  5/5 Wrist flexion   5/5  5/5 Elbow flexion   5/5  5/5 Deltoid     5/5  5/5  EHL    5/5  5/5 TA    5/5  5/5 GSC    5/5  5/5 Knee extension  5/5  5/5 Hip flexion   5/5  5/5  -Sensory exam    Sensation intact to light touch in L3-S1 nerve distributions of bilateral lower extremities  Sensation intact to light touch in C5-T1 nerve distributions of bilateral upper extremities  -Brachioradialis DTR: 2/4 on the left, 2/4 on the right -Biceps DTR: 2/4 on the left, 2/4 on the right -Achilles DTR: 2/4 on the left, 2/4 on the right -Patellar tendon DTR: 1/4 on the left, 1/4 on the right  -Spurling: Negative bilaterally -Hoffman sign: Negative bilaterally -Clonus: No beats bilaterally -Interosseous wasting: None seen -Grip and release test: Negative -Romberg: Negative -Gait: Normal -Imbalance with tandem gait: Yes  Left shoulder exam: Decreased range of motion but no pain through range of motion, negative drop arm sign, negative Jobe test, no weakness with external rotation with arm at side, negative belly press Right shoulder exam: Decreased range of motion but no pain through range of motion, negative drop arm sign, negative Jobe test, no weakness with external rotation with arm at side, negative belly press  Tinel's at wrist: Positive on the right, negative on the left Phalen's at wrist: Negative bilaterally Durkan's: Negative bilaterally  Tinel's at elbow: Negative bilaterally  Straight leg raise: negative bilaterally Left hip exam: No pain through range of motion, negative Stinchfield, negative FABER Right hip exam: No pain through range of motion, negative Stinchfield, negative FABER  Imaging: XR of the cervical spine from 08/25/2022 was independently reviewed and interpreted, showing disc height loss at C4/5, C5/6, C6/7 with anterior osteophyte formation at those levels (most significant at C4/5 and C5/6).  Loss of cervical lordosis.  No fracture or dislocation seen.  No evidence of instability on flexion/extension views.  XR of the lumbar  spine from 08/25/2022 was independently reviewed and interpreted, showing joint space narrowing in the bilateral hips with subchondral sclerosis noted on the right hip.  Disc height loss at L3/4 and L4/5.  No evidence of instability on flexion/extension views.  No fracture or dislocation seen.   Patient name: Robert Sparks Patient MRN: [id number] Date of visit: 08/25/22

## 2022-08-28 ENCOUNTER — Other Ambulatory Visit: Payer: Self-pay

## 2022-08-28 DIAGNOSIS — C9002 Multiple myeloma in relapse: Secondary | ICD-10-CM

## 2022-08-29 ENCOUNTER — Other Ambulatory Visit: Payer: Self-pay

## 2022-08-30 ENCOUNTER — Inpatient Hospital Stay: Payer: 59

## 2022-08-30 ENCOUNTER — Other Ambulatory Visit: Payer: Medicare Other

## 2022-08-30 ENCOUNTER — Other Ambulatory Visit: Payer: Self-pay

## 2022-08-30 ENCOUNTER — Ambulatory Visit: Payer: Medicare Other

## 2022-08-30 ENCOUNTER — Inpatient Hospital Stay (HOSPITAL_BASED_OUTPATIENT_CLINIC_OR_DEPARTMENT_OTHER): Payer: 59 | Admitting: Hematology

## 2022-08-30 VITALS — BP 140/65 | HR 78 | Temp 97.5°F | Resp 18 | Wt 175.4 lb

## 2022-08-30 VITALS — BP 131/72 | HR 75 | Temp 97.9°F | Resp 18

## 2022-08-30 DIAGNOSIS — Z7189 Other specified counseling: Secondary | ICD-10-CM

## 2022-08-30 DIAGNOSIS — Z5111 Encounter for antineoplastic chemotherapy: Secondary | ICD-10-CM

## 2022-08-30 DIAGNOSIS — C9002 Multiple myeloma in relapse: Secondary | ICD-10-CM

## 2022-08-30 DIAGNOSIS — Z5112 Encounter for antineoplastic immunotherapy: Secondary | ICD-10-CM | POA: Diagnosis not present

## 2022-08-30 LAB — CBC WITH DIFFERENTIAL (CANCER CENTER ONLY)
Abs Immature Granulocytes: 0.01 10*3/uL (ref 0.00–0.07)
Basophils Absolute: 0.1 10*3/uL (ref 0.0–0.1)
Basophils Relative: 2 %
Eosinophils Absolute: 0.2 10*3/uL (ref 0.0–0.5)
Eosinophils Relative: 6 %
HCT: 29.8 % — ABNORMAL LOW (ref 39.0–52.0)
Hemoglobin: 10 g/dL — ABNORMAL LOW (ref 13.0–17.0)
Immature Granulocytes: 0 %
Lymphocytes Relative: 33 %
Lymphs Abs: 1.3 10*3/uL (ref 0.7–4.0)
MCH: 33.1 pg (ref 26.0–34.0)
MCHC: 33.6 g/dL (ref 30.0–36.0)
MCV: 98.7 fL (ref 80.0–100.0)
Monocytes Absolute: 0.4 10*3/uL (ref 0.1–1.0)
Monocytes Relative: 10 %
Neutro Abs: 1.9 10*3/uL (ref 1.7–7.7)
Neutrophils Relative %: 49 %
Platelet Count: 205 10*3/uL (ref 150–400)
RBC: 3.02 MIL/uL — ABNORMAL LOW (ref 4.22–5.81)
RDW: 15.2 % (ref 11.5–15.5)
WBC Count: 3.9 10*3/uL — ABNORMAL LOW (ref 4.0–10.5)
nRBC: 0 % (ref 0.0–0.2)

## 2022-08-30 LAB — COMPREHENSIVE METABOLIC PANEL
ALT: 26 U/L (ref 0–44)
AST: 20 U/L (ref 15–41)
Albumin: 3.7 g/dL (ref 3.5–5.0)
Alkaline Phosphatase: 67 U/L (ref 38–126)
Anion gap: 7 (ref 5–15)
BUN: 13 mg/dL (ref 8–23)
CO2: 24 mmol/L (ref 22–32)
Calcium: 8.6 mg/dL — ABNORMAL LOW (ref 8.9–10.3)
Chloride: 107 mmol/L (ref 98–111)
Creatinine, Ser: 1.41 mg/dL — ABNORMAL HIGH (ref 0.61–1.24)
GFR, Estimated: 52 mL/min — ABNORMAL LOW (ref >60)
Glucose, Bld: 274 mg/dL — ABNORMAL HIGH (ref 70–99)
Potassium: 3.5 mmol/L (ref 3.5–5.1)
Sodium: 138 mmol/L (ref 135–145)
Total Bilirubin: 0.5 mg/dL (ref 0.3–1.2)
Total Protein: 6.1 g/dL — ABNORMAL LOW (ref 6.5–8.1)

## 2022-08-30 MED ORDER — DIPHENHYDRAMINE HCL 25 MG PO CAPS
50.0000 mg | ORAL_CAPSULE | Freq: Once | ORAL | Status: AC
  Administered 2022-08-30: 50 mg via ORAL
  Filled 2022-08-30: qty 2

## 2022-08-30 MED ORDER — SODIUM CHLORIDE 0.9 % IV SOLN: Freq: Once | INTRAVENOUS | Status: AC

## 2022-08-30 MED ORDER — SODIUM CHLORIDE 0.9 % IV SOLN
20.0000 mg | Freq: Once | INTRAVENOUS | Status: AC
  Administered 2022-08-30: 20 mg via INTRAVENOUS
  Filled 2022-08-30: qty 20

## 2022-08-30 MED ORDER — FAMOTIDINE IN NACL 20-0.9 MG/50ML-% IV SOLN
20.0000 mg | Freq: Once | INTRAVENOUS | Status: AC
  Administered 2022-08-30: 20 mg via INTRAVENOUS
  Filled 2022-08-30: qty 50

## 2022-08-30 MED ORDER — ACETAMINOPHEN 325 MG PO TABS
650.0000 mg | ORAL_TABLET | Freq: Once | ORAL | Status: AC
  Administered 2022-08-30: 650 mg via ORAL
  Filled 2022-08-30: qty 2

## 2022-08-30 MED ORDER — SODIUM CHLORIDE 0.9 % IV SOLN
16.0000 mg/kg | Freq: Once | INTRAVENOUS | Status: AC
  Administered 2022-08-30: 1200 mg via INTRAVENOUS
  Filled 2022-08-30: qty 60

## 2022-08-30 NOTE — Progress Notes (Signed)
HEMATOLOGY/ONCOLOGY CLINIC NOTE  Date of Service: 08/30/22   Patient Care Team: [name], MD as PCP - General (Internal Medicine)  CHIEF COMPLAINTS/PURPOSE OF CONSULTATION:  Follow-up for continued evaluation and management of relapsed multiple myeloma  Oncologic History:  77 y.o. male with diagnosis of IgA lambda active multiple myeloma, ISS Stage I. Active disease diagnosed based on presence of anemia, kidney insufficiency, and paraproteinemia with significant predominance of lambda light chains as well as significant elevation of IgA. Bone marrow biopsy confirmed presence of atypical monoclonal plasma cell process in the bone marrow comprising at least 7% of the cellularity. Based on the findings, patient was started on treatment with lenalidomide, bortezomib, and low-dose dexamethasone based on the anticipated tolerance by the patient. Lenalidomide was started at 10 mg daily based on creatinine clearance of 42, dexamethasone dose was reduced to 20 mg weekly based on patient's age. Treatment was complicated by rapid development of cutaneous rash attributed to lenalidomide, inaddition to decreased renal function and now patient has persistent severe anemia. Side-effects were attributed to Lenalidomide and lenalidomide was discontinued with subsequent recovery.  Patient has been receiving bortezomib weekly with low-dose dexamethasone in 4-day cycles.   Patient has completed induction systemic therapy for his disease with repeat bone marrow biopsy confirming complete response including no evidence of minimal residual disease by cytogenetics or FISH.  HISTORY OF PRESENTING ILLNESS:  Please see previous note for details of initial presentation.  INTERVAL HISTORY:   [name] is a 77 y.o. male is here for continued evaluation and management of his relapsed multiple myeloma.   Patient was last seen by me on 08/02/2022 and he complained of occasional diarrhea,  constipation, back and hip pain with movement.   Patient reports he has been doing well overall without any new medical concerns since our last visit. He does complain of occasional cough, occasional diarrhea, and occasional bilateral hand and bilateral leg neuropathy. He notes that his blood sugar levels have been controlled. He also complains of lower back pain with movement and is going to start physical therapy soon.   He denies fever, chills, night sweats, infection issues, abnormal bowel movement, chest pain, back pain, bone pain, abdominal pain, or leg swelling.   He has received his influenza vaccine, COVID-19 Booster, and RSV vaccine.   MEDICAL HISTORY:  Past Medical History:  Diagnosis Date   Anemia    Arthritis    shoulders,feet    Cataract    per eye dr -has appt 5-11   CKD (chronic kidney disease), stage III (HCC)    Elevated PSA    History of ketoacidosis    03-29-2015    History of sepsis    07-25-2013  non-compliant w/ medication   Hyperlipidemia    Hypertension    Nocturia    Peripheral neuropathy    Prostate cancer ([place])    Type 2 diabetes mellitus with insulin therapy [facility])     SURGICAL HISTORY: Past Surgical History:  Procedure Laterality Date   CIRCUMCISION  1990's   PROSTATE BIOPSY N/A 12/21/2015   Procedure: BIOPSY TRANSRECTAL ULTRASONIC PROSTATE (TUBP);  Surgeon: Festus Aloe, MD;  Location: [facility];  Service: Urology;  Laterality: N/A;    SOCIAL HISTORY: Social History   Socioeconomic History   Marital status: Single    Spouse name: Not on file   Number of children: Not on file   Years of education: Not on file   Highest education level: Not on  file  Occupational History   Not on file  Tobacco Use   Smoking status: Every Day    Packs/day: 0.50    Years: 50.00    Total pack years: 25.00    Types: Cigarettes   Smokeless tobacco: Never   Tobacco comments:    1 ppwk-4-5 a day   Vaping Use   Vaping Use: Never used   Substance and Sexual Activity   Alcohol use: Yes    Alcohol/week: 1.0 standard drink of alcohol    Types: 1 Cans of beer per week    Comment: 1 every day-quit couple weeks ago   Drug use: No   Sexual activity: Not Currently  Other Topics Concern   Not on file  Social History Narrative   Not on file   Social Determinants of Health   Financial Resource Strain: Not on file  Food Insecurity: Not on file  Transportation Needs: Not on file  Physical Activity: Not on file  Stress: Not on file  Social Connections: Not on file  Intimate Partner Violence: Not on file    FAMILY HISTORY: Family History  Problem Relation Age of Onset   Kidney disease Mother    Cancer Neg Hx    Colon cancer Neg Hx    Colon polyps Neg Hx    Esophageal cancer Neg Hx    Rectal cancer Neg Hx    Stomach cancer Neg Hx     ALLERGIES:  is allergic to other and lisinopril.  MEDICATIONS:  Current Outpatient Medications  Medication Sig Dispense Refill   Accu-Chek Softclix Lancets lancets Use to test blood sugar 3 times daily. 100 each 3   acyclovir (ZOVIRAX) 400 MG tablet Take 1 tablet (400 mg total) by mouth 2 (two) times daily. 60 tablet 11   amLODipine (NORVASC) 10 MG tablet TAKE 1 TABLET (10 MG TOTAL) BY MOUTH DAILY. 90 tablet 3   aspirin EC 81 MG tablet Take 81 mg by mouth daily. Swallow whole.     b complex vitamins capsule Take 1 capsule by mouth daily. 30 capsule 5   Blood Glucose Monitoring Suppl (ACCU-CHEK GUIDE) w/Device KIT Use to test blood sugar three times daily 1 kit 0   Continuous Blood Gluc Receiver (FREESTYLE LIBRE READER) DEVI Use to check blood sugar 3x daily. E11.42 1 each 0   Continuous Blood Gluc Sensor (FREESTYLE LIBRE SENSOR SYSTEM) MISC Use to check blood sugar 3 times daily. Change sensor every 2 weeks. 2 each 12   dexamethasone (DECADRON) 4 MG tablet '20mg'$  (5 tabs) Take the day after each dose of daratumumab. Take with breakfast 20 tablet 11   diclofenac Sodium (VOLTAREN) 1 % GEL  Apply 4 g topically 4 (four) times daily. 150 g 5   donepezil (ARICEPT) 5 MG tablet TAKE 1 TABLET (5 MG TOTAL) BY MOUTH AT BEDTIME. FOR MEMORY ISSUES 30 tablet 5   ferrous sulfate 325 (65 FE) MG tablet Take 325 mg by mouth daily.     gabapentin (NEURONTIN) 300 MG capsule TAKE 1 CAPSULE BY MOUTH IN THE MORNING AND LUNCH AND 2 AT BEDTIME 120 capsule 6   glucose blood (ACCU-CHEK GUIDE) test strip Use to test blood sugar 3 times daily. 100 each 2   hydrOXYzine (ATARAX) 25 MG tablet TAKE 1 TABLET (25 MG TOTAL) BY MOUTH 3 (THREE) TIMES DAILY AS NEEDED FOR ITCHING (RASH). 30 tablet 0   Insulin Lispro Prot & Lispro (HUMALOG MIX 75/25 KWIKPEN) (75-25) 100 UNIT/ML Kwikpen inject 8 units subcutaneously with  breakfast and dinner. 15 mL 11   Insulin Pen Needle (TECHLITE PEN NEEDLES) 32G X 4 MM MISC USE AS DIRECTED TWICE DAILY WITH INSULIN 100 each 1   Miconazole Nitrate (ATHLETES FOOT POWDER SPRAY) 2 % AERP Spray between toes and under toes once daily. 130 g 4   Multiple Vitamin (MULTIVITAMIN WITH MINERALS) TABS tablet Take 1 tablet by mouth daily. 60 tablet 2   ondansetron (ZOFRAN) 8 MG tablet Take 1 tablet (8 mg total) by mouth 2 (two) times daily as needed (Nausea or vomiting). 30 tablet 1   polyethylene glycol (MIRALAX) 17 g packet Take 17 g by mouth daily. 30 each 2   pomalidomide (POMALYST) 2 MG capsule Take 1 capsule (2 mg total) by mouth daily. 3 weeks on 1 week off 21 capsule 1   pravastatin (PRAVACHOL) 20 MG tablet Take 1 tablet (20 mg total) by mouth daily. 90 tablet 0   prochlorperazine (COMPAZINE) 10 MG tablet Take 1 tablet (10 mg total) by mouth every 6 (six) hours as needed (Nausea or vomiting). 30 tablet 1   tadalafil (CIALIS) 20 MG tablet Take 20 mg by mouth as needed.     tiZANidine (ZANAFLEX) 2 MG tablet Take 1 tablet (2 mg total) by mouth at bedtime as needed for muscle spasms. 30 tablet 1   valsartan (DIOVAN) 40 MG tablet Take 1 tablet (40 mg total) by mouth daily. STOP LISINOPRIL 90 tablet  3   vitamin B-12 (CYANOCOBALAMIN) 100 MCG tablet Take 100 mcg by mouth daily.     No current facility-administered medications for this visit.    ROS .10 Point review of Systems was done is negative except as noted above.  PHYSICAL EXAMINATION: .BP (!) 140/65   Pulse 78   Temp (!) 97.5 F (36.4 C)   Resp 18   Wt 175 lb 6.4 oz (79.6 kg)   SpO2 100%   BMI 27.47 kg/m  . GENERAL:alert, in no acute distress and comfortable SKIN: no acute rashes, no significant lesions EYES: conjunctiva are pink and non-injected, sclera anicteric OROPHARYNX: MMM, no exudates, no oropharyngeal erythema or ulceration NECK: supple, no JVD LYMPH:  no palpable lymphadenopathy in the cervical, axillary or inguinal regions LUNGS: clear to auscultation b/l with normal respiratory effort HEART: regular rate & rhythm ABDOMEN:  normoactive bowel sounds , non tender, not distended. Extremity: no pedal edema PSYCH: alert & oriented x 3 with fluent speech NEURO: no focal motor/sensory deficits   LABORATORY DATA:  I have reviewed the data as listed  .    Latest Ref Rng & Units 08/16/2022    8:54 AM 08/02/2022   11:01 AM 07/07/2022   10:21 AM  CBC  WBC 4.0 - 10.5 K/uL 4.1  5.4  4.7   Hemoglobin 13.0 - 17.0 g/dL 10.4  10.6  11.4   Hematocrit 39.0 - 52.0 % 30.1  31.2  32.9   Platelets 150 - 400 K/uL 247  224  180       Latest Ref Rng & Units 08/16/2022    8:54 AM 08/02/2022   11:01 AM 07/07/2022   10:21 AM  CMP  Glucose 70 - 99 mg/dL 206  135  184   BUN 8 - 23 mg/dL '16  16  21   '$ Creatinine 0.61 - 1.24 mg/dL 1.40  1.32  1.23   Sodium 135 - 145 mmol/L 134  137  139   Potassium 3.5 - 5.1 mmol/L 4.0  4.5  3.9   Chloride 98 -  111 mmol/L 106  107  107   CO2 22 - 32 mmol/L '22  23  25   '$ Calcium 8.9 - 10.3 mg/dL 8.9  9.2  9.1   Total Protein 6.5 - 8.1 g/dL 5.9  6.6  6.6   Total Bilirubin 0.3 - 1.2 mg/dL 0.4  0.5  0.5   Alkaline Phos 38 - 126 U/L 88  64  65   AST 15 - 41 U/L '15  27  24   '$ ALT 0 - 44 U/L 18   59  53      08/29/17 BM Bx:    08/29/17 Cytogenetics:   03/13/17 Cytogenetics:        PATHOLOGY Surgical Pathology  CASE: [id number]  PATIENT: Robert Sparks  Bone Marrow Report      Clinical History: Multiple myeloma in relapse ([place]) ,([facility])      DIAGNOSIS:   BONE MARROW, ASPIRATE, CLOT, CORE:  -Hypercellular bone marrow for age with plasma cell neoplasm  -See comment   PERIPHERAL BLOOD:  -Mild normocytic-normochromic anemia  -Eosinophilia   COMMENT:   The bone marrow is hypercellular for age with increased number of plasma  cells representing 8% of all cells in the aspirate associated with  numerous small clusters in the clot and biopsy sections. The plasma  cells display lambda light chain restriction consistent with plasma cell  neoplasm.  The background shows trilineage hematopoiesis with generally  nonspecific myeloid changes, likely secondary in nature in this setting.  Correlation with cytogenetic and FISH studies is recommended.      RADIOGRAPHIC STUDIES: I have personally reviewed the radiological images as listed and agreed with the findings in the report. No results found.  ASSESSMENT & PLAN:   77 y.o. male with   1.  Relapsed IgA Lambda Multiple Myeloma ISS Stage I    Active disease was previously diagnosed based on presence of anemia, kidney insufficiency, and paraproteinemia with significant predominance of lambda light chains as well as significant elevation of IgA. Patient is status post patient was treated with induction bortezomib Revlimid dexamethasone.  Had issues with skin rashes with Revlimid and this was discontinued.  Completed bortezomib dexamethasone induction and achieved complete remission. Was on maintenance Velcade 2 weeks which was then switched to maintenance Ninlaro. Patient had issues with worsening neuropathy which was a combination of Velcade and his diabetes.  Ninlaro was discontinued as well after maintenance of  more than 2 years.  2.  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 Bone marrow biopsy shows 8% lambda restricted plasma cells PET CT scan with no hypermetabolic osseous lesions  3.  Hypertension 4.  Diabetes type 2 5 .chronic kidney disease due to hypertension and diabetes. 6.  Peripheral neuropathy related to diabetes and previous myeloma treatments.  PLAN: -Discussed lab results from today, 08/30/2022, with the patient. CBC shows slightly decreased hemoglobin of 10.0 and hematocrit of 29.8. Patient is slightly anemic. CMP shows elevated glucose of 274 and slightly elevated creatinine of 1.41. Last myeloma panel showed no M-spike.  -Recommended Shingrex vaccine.   -Patient is tolerating his treatment well without any toxicities.   -No new focal symptoms related to multiple myeloma. -We will continue treatment without any dose modifications. Treatment orders reviewed and placed. -No toxicities with Daratumumab and Pomalidomide.  FOLLOW-UP: Per integrated scheduling  The total time spent in the appointment was 30 minutes* .  All of the patient's questions  were answered with apparent satisfaction. The patient knows to call the clinic with any problems, questions or concerns.   Sullivan Lone MD MS AAHIVMS [facility] Hematology/Oncology Physician [facility]  .*Total Encounter Time as defined by the Centers for Medicare and Medicaid Services includes, in addition to the face-to-face time of a patient visit (documented in the note above) non-face-to-face time: obtaining and reviewing outside history, ordering and reviewing medications, tests or procedures, care coordination (communications with other health care professionals or caregivers) and documentation in the medical record.   I, Cleda Mccreedy, am acting as a Education administrator for Sullivan Lone, MD.  .I have reviewed the above documentation for  accuracy and completeness, and I agree with the above. Brunetta Genera MD

## 2022-08-30 NOTE — Progress Notes (Signed)
Patient seen by MD today  Vitals are within treatment parameters.  Labs reviewed: and are within treatment parameters./ All labs were resulted and reviewed by Dr Irene Limbo  Per physician team, patient is ready for treatment and there are NO modifications to the treatment plan.

## 2022-08-30 NOTE — Patient Instructions (Signed)
[facility]  Discharge Instructions: Thank you for choosing [facility] to provide your oncology and hematology care.   If you have a lab appointment with the [facility], please go directly to the [facility] and check in at the registration area.   Wear comfortable clothing and clothing appropriate for easy access to any Portacath or PICC line.   We strive to give you quality time with your provider. You may need to reschedule your appointment if you arrive late (15 or more minutes).  Arriving late affects you and other patients whose appointments are after yours.  Also, if you miss three or more appointments without notifying the office, you may be dismissed from the clinic at the provider's discretion.      For prescription refill requests, have your pharmacy contact our office and allow 72 hours for refills to be completed.    Today you received the following chemotherapy and/or immunotherapy agents: daratumumab (darzalex)      To help prevent nausea and vomiting after your treatment, we encourage you to take your nausea medication as directed.  BELOW ARE SYMPTOMS THAT SHOULD BE REPORTED IMMEDIATELY: *FEVER GREATER THAN 100.4 F (38 C) OR HIGHER *CHILLS OR SWEATING *NAUSEA AND VOMITING THAT IS NOT CONTROLLED WITH YOUR NAUSEA MEDICATION *UNUSUAL SHORTNESS OF BREATH *UNUSUAL BRUISING OR BLEEDING *URINARY PROBLEMS (pain or burning when urinating, or frequent urination) *BOWEL PROBLEMS (unusual diarrhea, constipation, pain near the anus) TENDERNESS IN MOUTH AND THROAT WITH OR WITHOUT PRESENCE OF ULCERS (sore throat, sores in mouth, or a toothache) UNUSUAL RASH, SWELLING OR PAIN  UNUSUAL VAGINAL DISCHARGE OR ITCHING   Items with * indicate a potential emergency and should be followed up as soon as possible or go to the Emergency Department if any problems should occur.  Please show the CHEMOTHERAPY ALERT CARD or IMMUNOTHERAPY ALERT  CARD at check-in to the Emergency Department and triage nurse.  Should you have questions after your visit or need to cancel or reschedule your appointment, please contact [facility]  Dept: [phone]  and follow the prompts.  Office hours are 8:00 a.m. to 4:30 p.m. Monday - Friday. Please note that voicemails left after 4:00 p.m. may not be returned until the following business day.  We are closed weekends and major holidays. You have access to a nurse at all times for urgent questions. Please call the main number to the clinic Dept: [phone] and follow the prompts.   For any non-urgent questions, you may also contact your provider using MyChart. We now offer e-Visits for anyone 80 and older to request care online for non-urgent symptoms. For details visit [email or web address].   Also download the MyChart app! Go to the app store, search "MyChart", open the app, select [facility], and log in with your MyChart username and password.

## 2022-08-31 ENCOUNTER — Other Ambulatory Visit: Payer: Self-pay

## 2022-08-31 DIAGNOSIS — I129 Hypertensive chronic kidney disease with stage 1 through stage 4 chronic kidney disease, or unspecified chronic kidney disease: Secondary | ICD-10-CM | POA: Diagnosis not present

## 2022-08-31 DIAGNOSIS — N1831 Chronic kidney disease, stage 3a: Secondary | ICD-10-CM | POA: Diagnosis not present

## 2022-08-31 DIAGNOSIS — N2581 Secondary hyperparathyroidism of renal origin: Secondary | ICD-10-CM | POA: Diagnosis not present

## 2022-08-31 DIAGNOSIS — E1122 Type 2 diabetes mellitus with diabetic chronic kidney disease: Secondary | ICD-10-CM | POA: Diagnosis not present

## 2022-08-31 DIAGNOSIS — D631 Anemia in chronic kidney disease: Secondary | ICD-10-CM | POA: Diagnosis not present

## 2022-08-31 MED ORDER — FUROSEMIDE 20 MG PO TABS
20.0000 mg | ORAL_TABLET | Freq: Every day | ORAL | 0 refills | Status: DC
  Filled 2022-08-31: qty 30, 30d supply, fill #0

## 2022-09-01 ENCOUNTER — Other Ambulatory Visit: Payer: Self-pay

## 2022-09-05 ENCOUNTER — Encounter: Payer: Self-pay | Admitting: Hematology

## 2022-09-05 ENCOUNTER — Other Ambulatory Visit: Payer: Self-pay

## 2022-09-05 ENCOUNTER — Other Ambulatory Visit: Payer: Self-pay | Admitting: Internal Medicine

## 2022-09-05 ENCOUNTER — Telehealth: Payer: Self-pay | Admitting: Internal Medicine

## 2022-09-05 DIAGNOSIS — I10 Essential (primary) hypertension: Secondary | ICD-10-CM

## 2022-09-05 MED ORDER — AMLODIPINE BESYLATE 10 MG PO TABS
ORAL_TABLET | Freq: Every day | ORAL | 0 refills | Status: DC
  Filled 2022-09-05 – 2022-09-06 (×2): qty 90, 90d supply, fill #0

## 2022-09-05 NOTE — Telephone Encounter (Signed)
Requested Prescriptions  Pending Prescriptions Disp Refills   amLODipine (NORVASC) 10 MG tablet 90 tablet 0    Sig: TAKE 1 TABLET (10 MG TOTAL) BY MOUTH DAILY.     Cardiovascular: Calcium Channel Blockers 2 Passed - 09/05/2022 12:40 PM      Passed - Last BP in normal range    BP Readings from Last 1 Encounters:  08/30/22 131/72         Passed - Last Heart Rate in normal range    Pulse Readings from Last 1 Encounters:  08/30/22 75         Passed - Valid encounter within last 6 months    Recent Outpatient Visits           2 months ago Type 2 diabetes mellitus with peripheral neuropathy [facility])   [place], [place] L, RPH-CPP   4 months ago Type 2 diabetes mellitus with peripheral neuropathy [facility])   [place] Karle Plumber B, MD   5 months ago Type 2 diabetes mellitus with peripheral neuropathy [facility])   [place], [place] L, RPH-CPP   6 months ago Type 2 diabetes mellitus with peripheral neuropathy [facility])   [place], [place] L, RPH-CPP   8 months ago Type 2 diabetes mellitus with peripheral neuropathy [facility])   [place], MD       Future Appointments             In 2 days [name], MD [place]   In 1 month Laurance Flatten Venia Carbon, MD [place]

## 2022-09-05 NOTE — Telephone Encounter (Signed)
Copied from [place] [phone]. Topic: General - Other >> Sep 05, 2022 12:48 PM Dominique A wrote: Reason for CRM: Pt is calling to see if he could have his PCP give him a call back.

## 2022-09-06 ENCOUNTER — Telehealth: Payer: Self-pay | Admitting: Hematology

## 2022-09-06 ENCOUNTER — Other Ambulatory Visit: Payer: Self-pay

## 2022-09-06 ENCOUNTER — Ambulatory Visit: Payer: Self-pay

## 2022-09-06 NOTE — Telephone Encounter (Signed)
Called & spoke to the patient. Verified name & DOB. Robert Sparks stated that he was able to resolve his question & no longer requires assistance.

## 2022-09-06 NOTE — Telephone Encounter (Signed)
Called patient per 2/28 secure chat about rescheduling 3/6 appointment for resource maintenance. Patient rescheduled and notified.

## 2022-09-07 ENCOUNTER — Other Ambulatory Visit: Payer: Self-pay

## 2022-09-07 ENCOUNTER — Ambulatory Visit: Payer: 59 | Attending: Internal Medicine | Admitting: Internal Medicine

## 2022-09-07 ENCOUNTER — Encounter: Payer: Self-pay | Admitting: Internal Medicine

## 2022-09-07 VITALS — BP 129/72 | HR 68 | Temp 98.3°F | Ht 67.0 in | Wt 174.0 lb

## 2022-09-07 DIAGNOSIS — L602 Onychogryphosis: Secondary | ICD-10-CM | POA: Diagnosis not present

## 2022-09-07 DIAGNOSIS — Z23 Encounter for immunization: Secondary | ICD-10-CM

## 2022-09-07 DIAGNOSIS — F17200 Nicotine dependence, unspecified, uncomplicated: Secondary | ICD-10-CM

## 2022-09-07 DIAGNOSIS — E1159 Type 2 diabetes mellitus with other circulatory complications: Secondary | ICD-10-CM

## 2022-09-07 DIAGNOSIS — G620 Drug-induced polyneuropathy: Secondary | ICD-10-CM

## 2022-09-07 DIAGNOSIS — E1142 Type 2 diabetes mellitus with diabetic polyneuropathy: Secondary | ICD-10-CM | POA: Diagnosis not present

## 2022-09-07 DIAGNOSIS — E785 Hyperlipidemia, unspecified: Secondary | ICD-10-CM

## 2022-09-07 DIAGNOSIS — E1169 Type 2 diabetes mellitus with other specified complication: Secondary | ICD-10-CM | POA: Diagnosis not present

## 2022-09-07 DIAGNOSIS — I152 Hypertension secondary to endocrine disorders: Secondary | ICD-10-CM

## 2022-09-07 DIAGNOSIS — N1831 Chronic kidney disease, stage 3a: Secondary | ICD-10-CM | POA: Diagnosis not present

## 2022-09-07 DIAGNOSIS — E663 Overweight: Secondary | ICD-10-CM

## 2022-09-07 DIAGNOSIS — C9002 Multiple myeloma in relapse: Secondary | ICD-10-CM | POA: Diagnosis not present

## 2022-09-07 DIAGNOSIS — L84 Corns and callosities: Secondary | ICD-10-CM | POA: Diagnosis not present

## 2022-09-07 LAB — POCT GLYCOSYLATED HEMOGLOBIN (HGB A1C): HbA1c, POC (controlled diabetic range): 7.9 % — AB (ref 0.0–7.0)

## 2022-09-07 LAB — GLUCOSE, POCT (MANUAL RESULT ENTRY): POC Glucose: 162 mg/dl — AB (ref 70–99)

## 2022-09-07 MED ORDER — ZOSTER VAC RECOMB ADJUVANTED 50 MCG/0.5ML IM SUSR
0.5000 mL | Freq: Once | INTRAMUSCULAR | 1 refills | Status: AC
  Filled 2022-09-07: qty 1, 1d supply, fill #0

## 2022-09-07 NOTE — Progress Notes (Signed)
Patient ID: Robert Sparks, male    DOB: 03/05/1946  MRN: [id number]  CC: Diabetes (Dm f/u. Med refill. Neoma Laming receive flu vax. Needs shingles vax prescription )   Subjective: Robert Sparks is a 77 y.o. male who presents for chronic ds management His concerns today include:  Patient with history of HTN, DM 2 with polyneuropathy, HL, CKD stage 3, tob dependence, EtOH abuse, prostate CA, OA knee, vit B 12 def, spinal stenosis, and multiple myeloma in remission, memory changes MMSE 25/30 2023), CTS RT hand/EMG 12/2018.     Multiple myeloma IgA lambda type: Continues to follow with his hematologist Dr. Irene Limbo for relapsed disease.  Currently being treated.  Has anemia associated with disease but stable.  DM: Results for orders placed or performed in visit on 09/07/22  POCT glucose (manual entry)  Result Value Ref Range   POC Glucose 162 (A) 70 - 99 mg/dl  POCT glycosylated hemoglobin (Hb A1C)  Result Value Ref Range   Hemoglobin A1C     HbA1c POC (<> result, manual entry)     HbA1c, POC (prediabetic range)     HbA1c, POC (controlled diabetic range) 7.9 (A) 0.0 - 7.0 %   *Note: Due to a large number of results and/or encounters for the requested time period, some results have not been displayed. A complete set of results can be found in Results Review.  A1C has not changed compared to 4 mths ago Currently on Humalog 75/25  7 units BID; increases to 9 units for 3 days after taking Dexamethasone 20 mg Q 2 wks with Daratumumab infusion for MM. BS get close to 300s. Checks BS 3-4 times a day.  Does not have log or glucometer.  Range 110-160.  No lows Reports CGM cost too much. Gained 5 lbs since last visit.  Tries to stay away from sweets and salt Some numbness in RT hand at times that wakes him from sleep.  Has known CTS In regards to his blood pressure: We had changed lisinopril to Diovan 40 mg on last visit.  Also on Norvasc 10 mg daily.  Compliant with medications. Taking and  tolerating Pravachol He continues to smoke. 1pk last 3 days History of CKD 3a with GFR was stable in the 50s. Saw nephrologist 2-3 days ago.  Reports things stable.  Given some Lasix 20 mg for LE edema.  Swelling has decreased  He has been seeing orthopedics Dr. Ileene Rubens for neck pain and lower back pain with left-sided sciatica.  Referred for physical therapy.  PT will start in March.  Pt has been using Tylenol and Voltaren Gel.  Both help.  HM: Due for urine microalbumin, foot exam and shingles vaccine.   Patient Active Problem List   Diagnosis Date Noted   Drug-induced polyneuropathy ([place]) 09/07/2022   Pain in right shoulder 01/06/2022   Port-A-Cath in place 11/02/2021   Macroalbuminuric diabetic nephropathy ([place]) 08/31/2021   Multiple myeloma in relapse ([place]) 06/24/2021   Counseling regarding advance care planning and goals of care 06/24/2021   Memory changes 04/25/2019   Hyperlipidemia associated with type 2 diabetes mellitus ([place]) 04/25/2019   Chronic bilateral low back pain without sciatica 03/14/2019   Spinal stenosis, lumbar region with neurogenic claudication 03/14/2019   History of prostate cancer 12/20/2018   Neuropathy of right hand 12/20/2018   Primary osteoarthritis of right knee 01/07/2018   Diabetic polyneuropathy associated with type 2 diabetes mellitus ([place]) 01/07/2018   Vitamin B12 deficiency 09/04/2017  Cataract of both eyes 04/12/2017   Tobacco dependence 04/12/2017   Multiple myeloma in remission ([place]) 02/23/2017   Hyperlipidemia 06/21/2016   CKD (chronic kidney disease) stage 3, GFR 30-59 ml/min (HCC) 02/23/2016   Malignant neoplasm of prostate ([place]) 02/21/2016   Controlled type 2 diabetes mellitus with stage 3 chronic kidney disease, with long-term current use of insulin ([place]) 03/29/2015   HTN (hypertension) 07/28/2013   Anemia, chronic disease 07/26/2013   ETOH abuse 07/25/2013     Current Outpatient Medications on File Prior to Visit  Medication  Sig Dispense Refill   Accu-Chek Softclix Lancets lancets Use to test blood sugar 3 times daily. 100 each 3   acyclovir (ZOVIRAX) 400 MG tablet Take 1 tablet (400 mg total) by mouth 2 (two) times daily. 60 tablet 11   amLODipine (NORVASC) 10 MG tablet TAKE 1 TABLET (10 MG TOTAL) BY MOUTH DAILY. 90 tablet 0   aspirin EC 81 MG tablet Take 81 mg by mouth daily. Swallow whole.     b complex vitamins capsule Take 1 capsule by mouth daily. 30 capsule 5   Blood Glucose Monitoring Suppl (ACCU-CHEK GUIDE) w/Device KIT Use to test blood sugar three times daily 1 kit 0   Continuous Blood Gluc Receiver (FREESTYLE LIBRE READER) DEVI Use to check blood sugar 3x daily. E11.42 1 each 0   Continuous Blood Gluc Sensor (FREESTYLE LIBRE SENSOR SYSTEM) MISC Use to check blood sugar 3 times daily. Change sensor every 2 weeks. 2 each 12   dexamethasone (DECADRON) 4 MG tablet '20mg'$  (5 tabs) Take the day after each dose of daratumumab. Take with breakfast 20 tablet 11   diclofenac Sodium (VOLTAREN) 1 % GEL Apply 4 g topically 4 (four) times daily. 150 g 5   ferrous sulfate 325 (65 FE) MG tablet Take 325 mg by mouth daily.     furosemide (LASIX) 20 MG tablet Take 1 tablet (20 mg total) by mouth daily in the morning for 7 days (until 09/08/22). Save remaining tablets. 30 tablet 0   gabapentin (NEURONTIN) 300 MG capsule TAKE 1 CAPSULE BY MOUTH IN THE MORNING AND LUNCH AND 2 AT BEDTIME 120 capsule 6   glucose blood (ACCU-CHEK GUIDE) test strip Use to test blood sugar 3 times daily. 100 each 2   hydrOXYzine (ATARAX) 25 MG tablet TAKE 1 TABLET (25 MG TOTAL) BY MOUTH 3 (THREE) TIMES DAILY AS NEEDED FOR ITCHING (RASH). 30 tablet 0   Insulin Lispro Prot & Lispro (HUMALOG MIX 75/25 KWIKPEN) (75-25) 100 UNIT/ML Kwikpen inject 8 units subcutaneously with breakfast and dinner. 15 mL 11   Insulin Pen Needle (TECHLITE PEN NEEDLES) 32G X 4 MM MISC USE AS DIRECTED TWICE DAILY WITH INSULIN 100 each 1   Miconazole Nitrate (ATHLETES FOOT POWDER  SPRAY) 2 % AERP Spray between toes and under toes once daily. 130 g 4   Multiple Vitamin (MULTIVITAMIN WITH MINERALS) TABS tablet Take 1 tablet by mouth daily. 60 tablet 2   ondansetron (ZOFRAN) 8 MG tablet Take 1 tablet (8 mg total) by mouth 2 (two) times daily as needed (Nausea or vomiting). 30 tablet 1   polyethylene glycol (MIRALAX) 17 g packet Take 17 g by mouth daily. 30 each 2   pomalidomide (POMALYST) 2 MG capsule Take 1 capsule (2 mg total) by mouth daily. 3 weeks on 1 week off 21 capsule 1   pravastatin (PRAVACHOL) 20 MG tablet Take 1 tablet (20 mg total) by mouth daily. 90 tablet 0   prochlorperazine (COMPAZINE) 10  MG tablet Take 1 tablet (10 mg total) by mouth every 6 (six) hours as needed (Nausea or vomiting). 30 tablet 1   tadalafil (CIALIS) 20 MG tablet Take 20 mg by mouth as needed.     tiZANidine (ZANAFLEX) 2 MG tablet Take 1 tablet (2 mg total) by mouth at bedtime as needed for muscle spasms. 30 tablet 1   valsartan (DIOVAN) 40 MG tablet Take 1 tablet (40 mg total) by mouth daily. STOP LISINOPRIL 90 tablet 3   vitamin B-12 (CYANOCOBALAMIN) 100 MCG tablet Take 100 mcg by mouth daily.     donepezil (ARICEPT) 5 MG tablet TAKE 1 TABLET (5 MG TOTAL) BY MOUTH AT BEDTIME. FOR MEMORY ISSUES 30 tablet 5   No current facility-administered medications on file prior to visit.    Allergies  Allergen Reactions   Other Other (See Comments)    Nicoderm CQ = Bad Dreams Nicotine gum = Bad Dreams    Lisinopril Cough    Social History   Socioeconomic History   Marital status: Single    Spouse name: Not on file   Number of children: Not on file   Years of education: Not on file   Highest education level: Not on file  Occupational History   Not on file  Tobacco Use   Smoking status: Every Day    Packs/day: 0.50    Years: 50.00    Total pack years: 25.00    Types: Cigarettes   Smokeless tobacco: Never   Tobacco comments:    1 ppwk-4-5 a day   Vaping Use   Vaping Use: Never used   Substance and Sexual Activity   Alcohol use: Yes    Alcohol/week: 1.0 standard drink of alcohol    Types: 1 Cans of beer per week    Comment: 1 every day-quit couple weeks ago   Drug use: No   Sexual activity: Not Currently  Other Topics Concern   Not on file  Social History Narrative   Not on file   Social Determinants of Health   Financial Resource Strain: Not on file  Food Insecurity: Not on file  Transportation Needs: Not on file  Physical Activity: Not on file  Stress: Not on file  Social Connections: Not on file  Intimate Partner Violence: Not on file    Family History  Problem Relation Age of Onset   Kidney disease Mother    Cancer Neg Hx    Colon cancer Neg Hx    Colon polyps Neg Hx    Esophageal cancer Neg Hx    Rectal cancer Neg Hx    Stomach cancer Neg Hx     Past Surgical History:  Procedure Laterality Date   CIRCUMCISION  1990's   PROSTATE BIOPSY N/A 12/21/2015   Procedure: BIOPSY TRANSRECTAL ULTRASONIC PROSTATE (TUBP);  Surgeon: Festus Aloe, MD;  Location: [facility];  Service: Urology;  Laterality: N/A;    ROS: Review of Systems Negative except as stated above  PHYSICAL EXAM: BP 129/72 (BP Location: Left Arm, Patient Position: Sitting, Cuff Size: Normal)   Pulse 68   Temp 98.3 F (36.8 C) (Oral)   Ht '5\' 7"'$  (1.702 m)   Wt 174 lb (78.9 kg)   SpO2 99%   BMI 27.25 kg/m   Wt Readings from Last 3 Encounters:  09/07/22 174 lb (78.9 kg)  08/30/22 175 lb 6.4 oz (79.6 kg)  08/25/22 175 lb (79.4 kg)    Physical Exam   General appearance - alert,  well appearing, elderly African-American male and in no distress Mental status - normal mood, behavior, speech, dress, motor activity, and thought processes Neck - supple, no significant adenopathy Chest - clear to auscultation, no wheezes, rales or rhonchi, symmetric air entry Heart - normal rate, regular rhythm, normal S1, S2, no murmurs, rubs, clicks or gallops Extremities  -trace lower extremity edema Diabetic Foot Exam - Simple   Simple Foot Form Diabetic Foot exam was performed with the following findings: Yes 09/07/2022  9:56 AM  Visual Inspection See comments: Yes Sensation Testing See comments: Yes Pulse Check Posterior Tibialis and Dorsalis pulse intact bilaterally: Yes Comments Skin on the sole of the feet dry and peeling.  Moderate sized callus on plantar surface of the right foot between the first and second toes.  Small callus medially to the left big toe.  Decreased sensation on leap exam on the plantar surface of both feet        10/10/2021   10:04 AM 05/25/2020    9:08 AM 04/25/2019   11:35 AM  MMSE - Mini Mental State Exam  Orientation to time '4 5 5  '$ Orientation to Place '5 5 5  '$ Registration '3 2 3  '$ Attention/ Calculation 3 0 0  Recall 2 3 0  Language- name 2 objects '2 2 2  '$ Language- repeat '1 1 1  '$ Language- follow 3 step command '3 3 3  '$ Language- read & follow direction '1 1 1  '$ Write a sentence 0 1 1  Copy design '1 1 1  '$ Total score '25 24 22       '$ Latest Ref Rng & Units 08/30/2022    9:16 AM 08/16/2022    8:54 AM 08/02/2022   11:01 AM  CMP  Glucose 70 - 99 mg/dL 274  206  135   BUN 8 - 23 mg/dL '13  16  16   '$ Creatinine 0.61 - 1.24 mg/dL 1.41  1.40  1.32   Sodium 135 - 145 mmol/L 138  134  137   Potassium 3.5 - 5.1 mmol/L 3.5  4.0  4.5   Chloride 98 - 111 mmol/L 107  106  107   CO2 22 - 32 mmol/L '24  22  23   '$ Calcium 8.9 - 10.3 mg/dL 8.6  8.9  9.2   Total Protein 6.5 - 8.1 g/dL 6.1  5.9  6.6   Total Bilirubin 0.3 - 1.2 mg/dL 0.5  0.4  0.5   Alkaline Phos 38 - 126 U/L 67  88  64   AST 15 - 41 U/L '20  15  27   '$ ALT 0 - 44 U/L 26  18  59    Lipid Panel     Component Value Date/Time   CHOL 184 04/25/2019 0939   TRIG 114 04/25/2019 0939   HDL 88 04/25/2019 0939   CHOLHDL 2.1 04/25/2019 0939   CHOLHDL 3.6 06/21/2016 1047   VLDL 50 (H) 06/21/2016 1047   LDLCALC 76 04/25/2019 0939    CBC    Component Value Date/Time   WBC  3.9 (L) 08/30/2022 0916   WBC 5.6 07/13/2021 0741   RBC 3.02 (L) 08/30/2022 0916   HGB 10.0 (L) 08/30/2022 0916   HGB 12.2 (L) 12/06/2017 0920   HGB 10.1 (L) 07/13/2017 0938   HCT 29.8 (L) 08/30/2022 0916   HCT 37.1 (L) 12/06/2017 0920   HCT 30.6 (L) 07/13/2017 0938   PLT 205 08/30/2022 0916   PLT 250 12/06/2017 0920   MCV 98.7  08/30/2022 0916   MCV 98 (H) 12/06/2017 0920   MCV 96.2 07/13/2017 0938   MCH 33.1 08/30/2022 0916   MCHC 33.6 08/30/2022 0916   RDW 15.2 08/30/2022 0916   RDW 14.1 12/06/2017 0920   RDW 14.4 07/13/2017 0938   LYMPHSABS 1.3 08/30/2022 0916   LYMPHSABS 0.8 (L) 07/13/2017 0938   MONOABS 0.4 08/30/2022 0916   MONOABS 0.4 07/13/2017 0938   EOSABS 0.2 08/30/2022 0916   EOSABS 0.5 07/13/2017 0938   EOSABS 0.8 (H) 11/21/2016 1416   BASOSABS 0.1 08/30/2022 0916   BASOSABS 0.0 07/13/2017 0938    ASSESSMENT AND PLAN: 1. Type 2 diabetes mellitus with peripheral neuropathy (HCC) Not at goal but A1c stable compared to last visit. Increase Humalog 75/25 to 8 units twice a day.  Continue 9 units for 3 days after use of dexamethasone which he takes twice a month prior to infusion for MM Continue healthy eating habits.  Encouraged him to move is much as he can. - POCT glucose (manual entry) - POCT glycosylated hemoglobin (Hb A1C) - Microalbumin / creatinine urine ratio  2. Hypertension associated with type 2 diabetes mellitus (HCC) At goal.  Continue Norvasc 10 mg daily and Diovan 40 mg daily.  3. Hyperlipidemia associated with type 2 diabetes mellitus (HCC) Continue Pravachol - Lipid panel  4. Multiple myeloma in relapse [facility]) Followed by and being managed by Dr. Irene Limbo  5. Tobacco dependence Strongly advised to quit.  Eligible for lung cancer screening.  Will address on his next Medicare wellness visit  6. Stage 3a chronic kidney disease (HCC) Stable.  Avoid oral NSAIDs  7. Overweight (BMI 25.0-29.9) Encourage healthy eating habits.  8. Pre-ulcerative  corn or callous 9. Overgrown toenails Patient has upcoming appointment with podiatry next month  10. Need for shingles vaccine Agreeable to receiving first Shingrix vaccine today.  Advised that the vaccine can cause some soreness, swelling and redness at the injection site - Zoster Vaccine Adjuvanted [facility]) injection; Inject 0.5 mLs into the muscle once for 1 dose.  Dispense: 1 each; Refill: 1  11. Drug-induced polyneuropathy ([place]) From previous treatments that he received for multiple myeloma.  Also has neuropathy associated with diabetes.    Patient was given the opportunity to ask questions.  Patient verbalized understanding of the plan and was able to repeat key elements of the plan.   This documentation was completed using Radio producer.  Any transcriptional errors are unintentional.  Orders Placed This Encounter  Procedures   Lipid panel   Microalbumin / creatinine urine ratio   POCT glucose (manual entry)   POCT glycosylated hemoglobin (Hb A1C)     Requested Prescriptions   Signed Prescriptions Disp Refills   Zoster Vaccine Adjuvanted [facility]) injection 1 each 1    Sig: Inject 0.5 mLs into the muscle once for 1 dose.    Return in about 4 months (around 01/06/2023).  Karle Plumber, MD, FACP

## 2022-09-07 NOTE — Patient Instructions (Signed)
Increase your insulin to 8 units twice a day.  I will send a message to our clinical pharmacist to see if we can get the continuous glucose monitor approved for you.  Keep upcoming appointment with the podiatrist.

## 2022-09-08 ENCOUNTER — Other Ambulatory Visit: Payer: Self-pay

## 2022-09-08 ENCOUNTER — Encounter: Payer: Self-pay | Admitting: Hematology

## 2022-09-09 LAB — MICROALBUMIN / CREATININE URINE RATIO
Creatinine, Urine: 68.5 mg/dL
Microalb/Creat Ratio: 404 mg/g creat — ABNORMAL HIGH (ref 0–29)
Microalbumin, Urine: 276.4 ug/mL

## 2022-09-09 LAB — LIPID PANEL
Chol/HDL Ratio: 2.1 ratio (ref 0.0–5.0)
Cholesterol, Total: 199 mg/dL (ref 100–199)
HDL: 96 mg/dL (ref >39)
LDL Chol Calc (NIH): 83 mg/dL (ref 0–99)
Triglycerides: 116 mg/dL (ref 0–149)
VLDL Cholesterol Cal: 20 mg/dL (ref 5–40)

## 2022-09-11 ENCOUNTER — Other Ambulatory Visit: Payer: Self-pay

## 2022-09-12 MED FILL — Dexamethasone Sodium Phosphate Inj 100 MG/10ML: INTRAMUSCULAR | Qty: 2 | Status: AC

## 2022-09-13 ENCOUNTER — Inpatient Hospital Stay: Payer: 59 | Attending: Hematology

## 2022-09-13 ENCOUNTER — Other Ambulatory Visit: Payer: Self-pay

## 2022-09-13 ENCOUNTER — Ambulatory Visit: Payer: Self-pay

## 2022-09-13 ENCOUNTER — Inpatient Hospital Stay: Payer: 59

## 2022-09-13 VITALS — BP 128/67 | HR 78 | Temp 98.0°F | Resp 16

## 2022-09-13 DIAGNOSIS — D649 Anemia, unspecified: Secondary | ICD-10-CM | POA: Diagnosis not present

## 2022-09-13 DIAGNOSIS — N189 Chronic kidney disease, unspecified: Secondary | ICD-10-CM | POA: Diagnosis not present

## 2022-09-13 DIAGNOSIS — E1122 Type 2 diabetes mellitus with diabetic chronic kidney disease: Secondary | ICD-10-CM | POA: Diagnosis not present

## 2022-09-13 DIAGNOSIS — C9002 Multiple myeloma in relapse: Secondary | ICD-10-CM | POA: Insufficient documentation

## 2022-09-13 DIAGNOSIS — I129 Hypertensive chronic kidney disease with stage 1 through stage 4 chronic kidney disease, or unspecified chronic kidney disease: Secondary | ICD-10-CM | POA: Diagnosis not present

## 2022-09-13 DIAGNOSIS — Z7189 Other specified counseling: Secondary | ICD-10-CM

## 2022-09-13 DIAGNOSIS — Z5112 Encounter for antineoplastic immunotherapy: Secondary | ICD-10-CM | POA: Diagnosis not present

## 2022-09-13 LAB — CBC WITH DIFFERENTIAL (CANCER CENTER ONLY)
Abs Immature Granulocytes: 0.02 10*3/uL (ref 0.00–0.07)
Basophils Absolute: 0.1 10*3/uL (ref 0.0–0.1)
Basophils Relative: 2 %
Eosinophils Absolute: 0.3 10*3/uL (ref 0.0–0.5)
Eosinophils Relative: 9 %
HCT: 35.4 % — ABNORMAL LOW (ref 39.0–52.0)
Hemoglobin: 12 g/dL — ABNORMAL LOW (ref 13.0–17.0)
Immature Granulocytes: 1 %
Lymphocytes Relative: 25 %
Lymphs Abs: 0.9 10*3/uL (ref 0.7–4.0)
MCH: 33.7 pg (ref 26.0–34.0)
MCHC: 33.9 g/dL (ref 30.0–36.0)
MCV: 99.4 fL (ref 80.0–100.0)
Monocytes Absolute: 0.3 10*3/uL (ref 0.1–1.0)
Monocytes Relative: 7 %
Neutro Abs: 2 10*3/uL (ref 1.7–7.7)
Neutrophils Relative %: 56 %
Platelet Count: 228 10*3/uL (ref 150–400)
RBC: 3.56 MIL/uL — ABNORMAL LOW (ref 4.22–5.81)
RDW: 15.3 % (ref 11.5–15.5)
WBC Count: 3.6 10*3/uL — ABNORMAL LOW (ref 4.0–10.5)
nRBC: 0 % (ref 0.0–0.2)

## 2022-09-13 LAB — COMPREHENSIVE METABOLIC PANEL
ALT: 25 U/L (ref 0–44)
AST: 19 U/L (ref 15–41)
Albumin: 4.3 g/dL (ref 3.5–5.0)
Alkaline Phosphatase: 81 U/L (ref 38–126)
Anion gap: 10 (ref 5–15)
BUN: 20 mg/dL (ref 8–23)
CO2: 24 mmol/L (ref 22–32)
Calcium: 9.7 mg/dL (ref 8.9–10.3)
Chloride: 102 mmol/L (ref 98–111)
Creatinine, Ser: 1.95 mg/dL — ABNORMAL HIGH (ref 0.61–1.24)
GFR, Estimated: 35 mL/min — ABNORMAL LOW (ref >60)
Glucose, Bld: 201 mg/dL — ABNORMAL HIGH (ref 70–99)
Potassium: 3.7 mmol/L (ref 3.5–5.1)
Sodium: 136 mmol/L (ref 135–145)
Total Bilirubin: 0.9 mg/dL (ref 0.3–1.2)
Total Protein: 7 g/dL (ref 6.5–8.1)

## 2022-09-13 MED ORDER — SODIUM CHLORIDE 0.9 % IV SOLN
20.0000 mg | Freq: Once | INTRAVENOUS | Status: AC
  Administered 2022-09-13: 20 mg via INTRAVENOUS
  Filled 2022-09-13: qty 20

## 2022-09-13 MED ORDER — FAMOTIDINE IN NACL 20-0.9 MG/50ML-% IV SOLN
20.0000 mg | Freq: Once | INTRAVENOUS | Status: AC
  Administered 2022-09-13: 20 mg via INTRAVENOUS
  Filled 2022-09-13: qty 50

## 2022-09-13 MED ORDER — DIPHENHYDRAMINE HCL 25 MG PO CAPS
50.0000 mg | ORAL_CAPSULE | Freq: Once | ORAL | Status: AC
  Administered 2022-09-13: 50 mg via ORAL
  Filled 2022-09-13: qty 2

## 2022-09-13 MED ORDER — ACETAMINOPHEN 325 MG PO TABS
650.0000 mg | ORAL_TABLET | Freq: Once | ORAL | Status: AC
  Administered 2022-09-13: 650 mg via ORAL
  Filled 2022-09-13: qty 2

## 2022-09-13 MED ORDER — SODIUM CHLORIDE 0.9 % IV SOLN
16.0000 mg/kg | Freq: Once | INTRAVENOUS | Status: AC
  Administered 2022-09-13: 1200 mg via INTRAVENOUS
  Filled 2022-09-13: qty 60

## 2022-09-13 MED ORDER — SODIUM CHLORIDE 0.9 % IV SOLN: Freq: Once | INTRAVENOUS | Status: AC

## 2022-09-13 NOTE — Patient Instructions (Signed)
[facility]  Discharge Instructions: Thank you for choosing [facility] to provide your oncology and hematology care.   If you have a lab appointment with the [facility], please go directly to the [facility] and check in at the registration area.   Wear comfortable clothing and clothing appropriate for easy access to any Portacath or PICC line.   We strive to give you quality time with your provider. You may need to reschedule your appointment if you arrive late (15 or more minutes).  Arriving late affects you and other patients whose appointments are after yours.  Also, if you miss three or more appointments without notifying the office, you may be dismissed from the clinic at the provider's discretion.      For prescription refill requests, have your pharmacy contact our office and allow 72 hours for refills to be completed.    Today you received the following chemotherapy and/or immunotherapy agents Darzalex   To help prevent nausea and vomiting after your treatment, we encourage you to take your nausea medication as directed.  BELOW ARE SYMPTOMS THAT SHOULD BE REPORTED IMMEDIATELY: *FEVER GREATER THAN 100.4 F (38 C) OR HIGHER *CHILLS OR SWEATING *NAUSEA AND VOMITING THAT IS NOT CONTROLLED WITH YOUR NAUSEA MEDICATION *UNUSUAL SHORTNESS OF BREATH *UNUSUAL BRUISING OR BLEEDING *URINARY PROBLEMS (pain or burning when urinating, or frequent urination) *BOWEL PROBLEMS (unusual diarrhea, constipation, pain near the anus) TENDERNESS IN MOUTH AND THROAT WITH OR WITHOUT PRESENCE OF ULCERS (sore throat, sores in mouth, or a toothache) UNUSUAL RASH, SWELLING OR PAIN  UNUSUAL VAGINAL DISCHARGE OR ITCHING   Items with * indicate a potential emergency and should be followed up as soon as possible or go to the Emergency Department if any problems should occur.  Please show the CHEMOTHERAPY ALERT CARD or IMMUNOTHERAPY ALERT CARD at check-in  to the Emergency Department and triage nurse.  Should you have questions after your visit or need to cancel or reschedule your appointment, please contact [facility]  Dept: [phone]  and follow the prompts.  Office hours are 8:00 a.m. to 4:30 p.m. Monday - Friday. Please note that voicemails left after 4:00 p.m. may not be returned until the following business day.  We are closed weekends and major holidays. You have access to a nurse at all times for urgent questions. Please call the main number to the clinic Dept: [phone] and follow the prompts.   For any non-urgent questions, you may also contact your provider using MyChart. We now offer e-Visits for anyone 24 and older to request care online for non-urgent symptoms. For details visit [email or web address].   Also download the MyChart app! Go to the app store, search "MyChart", open the app, select [facility], and log in with your MyChart username and password.

## 2022-09-13 NOTE — Progress Notes (Signed)
Per Dr. Irene Limbo, [place] to treat with creatinine 1.95.

## 2022-09-14 ENCOUNTER — Other Ambulatory Visit: Payer: Self-pay | Admitting: Pharmacist

## 2022-09-14 ENCOUNTER — Other Ambulatory Visit: Payer: Self-pay

## 2022-09-14 DIAGNOSIS — E1142 Type 2 diabetes mellitus with diabetic polyneuropathy: Secondary | ICD-10-CM

## 2022-09-14 MED ORDER — ACCU-CHEK SOFTCLIX LANCETS MISC
3 refills | Status: AC
  Filled 2022-09-14: qty 100, 33d supply, fill #0
  Filled 2023-07-30: qty 100, 33d supply, fill #1

## 2022-09-14 MED ORDER — ACCU-CHEK GUIDE VI STRP
ORAL_STRIP | 2 refills | Status: DC
  Filled 2022-09-14: qty 100, fill #0
  Filled 2022-10-05 – 2022-10-06 (×2): qty 100, 33d supply, fill #0
  Filled 2022-10-30 – 2022-10-31 (×2): qty 100, 33d supply, fill #1
  Filled 2022-11-20 – 2022-11-27 (×3): qty 100, 33d supply, fill #2

## 2022-09-14 MED ORDER — ACCU-CHEK GUIDE W/DEVICE KIT
PACK | 0 refills | Status: AC
  Filled 2022-09-14: qty 1, 90d supply, fill #0

## 2022-09-15 ENCOUNTER — Other Ambulatory Visit: Payer: Self-pay

## 2022-09-15 ENCOUNTER — Ambulatory Visit: Payer: Medicare Other | Admitting: Podiatry

## 2022-09-15 ENCOUNTER — Telehealth: Payer: Self-pay | Admitting: Licensed Clinical Social Worker

## 2022-09-15 NOTE — Patient Instructions (Signed)
Visit Information  Thank you for taking time to visit with me today. Please don't hesitate to contact me if I can be of assistance to you.   Please call the care guide team at [phone] if you need to cancel or reschedule your appointment.   If you are experiencing a Mental Health or [place] or need someone to talk to, please call the Suicide and Crisis Lifeline: 988 call 911   The patient verbalized understanding of instructions, educational materials, and care plan provided today and DECLINED offer to receive copy of patient instructions, educational materials, and care plan.   No further follow up required: Patient is not interested in care coordination services, at this time  Christa See, MSW, [email or web address] Phone [phone] 4:24 PM

## 2022-09-15 NOTE — Patient Outreach (Signed)
  Care Coordination   Initial Visit Note   09/15/2022 Name: Parris Cudworth Boudoin MRN: [id number] DOB: 02-Mar-1946  Erian Rosengren Abruzzo is a 77 y.o. year old male who sees [name], MD for primary care. I spoke with  Chinita Pester [place] by phone today.  What matters to the patients health and wellness today?  Care Coordination     SDOH assessments and interventions completed:  Yes  SDOH Interventions Today    Flowsheet Row Most Recent Value  SDOH Interventions   Housing Interventions Intervention Not Indicated  Transportation Interventions Intervention Not Indicated        Care Coordination Interventions:  Yes, provided  Interventions Today    Flowsheet Row Most Recent Value  Chronic Disease   Chronic disease during today's visit Hypertension (HTN), Diabetes, Chronic Kidney Disease/End Stage Renal Disease (ESRD)  General Interventions   General Interventions Discussed/Reviewed General Interventions Discussed, Communication with  [LCSW introduced self and explained role. Informed pt of care coordination services, pt declined services at this time. LCSW reviewed upcoming appts]  Communication with Pharmacists  [LCSW messaged [facility] Pharmacist informing him of pt's need to refill insulin. LCSW encouraged him to contact pharmacy Monday, 09/18/22]       Follow up plan: No further intervention required.   Encounter Outcome:  Pt. Visit Completed   Christa See, MSW, [email or web address] Phone [phone] 4:24 PM

## 2022-09-18 ENCOUNTER — Telehealth: Payer: Self-pay | Admitting: Internal Medicine

## 2022-09-18 ENCOUNTER — Other Ambulatory Visit: Payer: Self-pay

## 2022-09-18 DIAGNOSIS — E1142 Type 2 diabetes mellitus with diabetic polyneuropathy: Secondary | ICD-10-CM

## 2022-09-18 MED ORDER — INSULIN LISPRO PROT & LISPRO (75-25 MIX) 100 UNIT/ML KWIKPEN
PEN_INJECTOR | SUBCUTANEOUS | 11 refills | Status: DC
  Filled 2022-09-18: qty 15, 75d supply, fill #0
  Filled 2022-09-18: qty 15, fill #0
  Filled 2022-11-20: qty 15, 75d supply, fill #1
  Filled 2023-03-06: qty 15, 75d supply, fill #2

## 2022-09-18 NOTE — Telephone Encounter (Signed)
-----   Message from Tresa Endo, RPH-CPP sent at 09/18/2022  8:55 AM EDT ----- Call placed to patient. His reported hx is accurate. He will sometimes take 9-10 units BID instead of 7u BID for up to 3 days after his cancer treatment. He gets supportive dexamethasone and this increases his BG and insulin requirement.  [name],   Can we get a new script with 10u BID in the sig? This would help prevent insurance issues with "early fills".  ----- Message ----- From: Rickey Barbara, CPhT Sent: 09/18/2022   8:33 AM EDT To: Tresa Endo, RPH-CPP  Patient states he will be out of insulin within the week. Humalog-we have 8 units twice daily. He says he sometimes takes more because of his cancer treatments.He picked up a 90 day supply on 07/17/22 so with current directions he should be good until April.

## 2022-09-19 ENCOUNTER — Other Ambulatory Visit: Payer: Self-pay

## 2022-09-19 DIAGNOSIS — C9002 Multiple myeloma in relapse: Secondary | ICD-10-CM

## 2022-09-19 DIAGNOSIS — Z7189 Other specified counseling: Secondary | ICD-10-CM

## 2022-09-19 MED ORDER — POMALIDOMIDE 2 MG PO CAPS: 2.0000 mg | ORAL_CAPSULE | Freq: Every day | ORAL | 1 refills | Status: DC

## 2022-09-25 ENCOUNTER — Other Ambulatory Visit: Payer: Self-pay

## 2022-09-26 MED FILL — Dexamethasone Sodium Phosphate Inj 100 MG/10ML: INTRAMUSCULAR | Qty: 2 | Status: AC

## 2022-09-27 ENCOUNTER — Inpatient Hospital Stay: Payer: 59

## 2022-09-27 ENCOUNTER — Other Ambulatory Visit: Payer: Self-pay

## 2022-09-27 ENCOUNTER — Inpatient Hospital Stay (HOSPITAL_BASED_OUTPATIENT_CLINIC_OR_DEPARTMENT_OTHER): Payer: 59 | Admitting: Physician Assistant

## 2022-09-27 VITALS — BP 125/63 | HR 69 | Temp 98.0°F | Resp 14

## 2022-09-27 VITALS — BP 134/63 | HR 77 | Temp 97.6°F | Resp 16 | Wt 175.9 lb

## 2022-09-27 DIAGNOSIS — Z5112 Encounter for antineoplastic immunotherapy: Secondary | ICD-10-CM | POA: Diagnosis not present

## 2022-09-27 DIAGNOSIS — E1122 Type 2 diabetes mellitus with diabetic chronic kidney disease: Secondary | ICD-10-CM | POA: Diagnosis not present

## 2022-09-27 DIAGNOSIS — D649 Anemia, unspecified: Secondary | ICD-10-CM | POA: Diagnosis not present

## 2022-09-27 DIAGNOSIS — I129 Hypertensive chronic kidney disease with stage 1 through stage 4 chronic kidney disease, or unspecified chronic kidney disease: Secondary | ICD-10-CM | POA: Diagnosis not present

## 2022-09-27 DIAGNOSIS — N189 Chronic kidney disease, unspecified: Secondary | ICD-10-CM | POA: Diagnosis not present

## 2022-09-27 DIAGNOSIS — Z7189 Other specified counseling: Secondary | ICD-10-CM

## 2022-09-27 DIAGNOSIS — C9002 Multiple myeloma in relapse: Secondary | ICD-10-CM | POA: Diagnosis not present

## 2022-09-27 LAB — COMPREHENSIVE METABOLIC PANEL
ALT: 28 U/L (ref 0–44)
AST: 20 U/L (ref 15–41)
Albumin: 3.8 g/dL (ref 3.5–5.0)
Alkaline Phosphatase: 77 U/L (ref 38–126)
Anion gap: 7 (ref 5–15)
BUN: 12 mg/dL (ref 8–23)
CO2: 23 mmol/L (ref 22–32)
Calcium: 9.1 mg/dL (ref 8.9–10.3)
Chloride: 107 mmol/L (ref 98–111)
Creatinine, Ser: 1.32 mg/dL — ABNORMAL HIGH (ref 0.61–1.24)
GFR, Estimated: 57 mL/min — ABNORMAL LOW (ref >60)
Glucose, Bld: 179 mg/dL — ABNORMAL HIGH (ref 70–99)
Potassium: 3.9 mmol/L (ref 3.5–5.1)
Sodium: 137 mmol/L (ref 135–145)
Total Bilirubin: 0.5 mg/dL (ref 0.3–1.2)
Total Protein: 6 g/dL — ABNORMAL LOW (ref 6.5–8.1)

## 2022-09-27 LAB — CBC WITH DIFFERENTIAL (CANCER CENTER ONLY)
Abs Immature Granulocytes: 0.01 10*3/uL (ref 0.00–0.07)
Basophils Absolute: 0.1 10*3/uL (ref 0.0–0.1)
Basophils Relative: 2 %
Eosinophils Absolute: 0.4 10*3/uL (ref 0.0–0.5)
Eosinophils Relative: 12 %
HCT: 29.4 % — ABNORMAL LOW (ref 39.0–52.0)
Hemoglobin: 10.1 g/dL — ABNORMAL LOW (ref 13.0–17.0)
Immature Granulocytes: 0 %
Lymphocytes Relative: 26 %
Lymphs Abs: 0.9 10*3/uL (ref 0.7–4.0)
MCH: 34.5 pg — ABNORMAL HIGH (ref 26.0–34.0)
MCHC: 34.4 g/dL (ref 30.0–36.0)
MCV: 100.3 fL — ABNORMAL HIGH (ref 80.0–100.0)
Monocytes Absolute: 0.3 10*3/uL (ref 0.1–1.0)
Monocytes Relative: 10 %
Neutro Abs: 1.6 10*3/uL — ABNORMAL LOW (ref 1.7–7.7)
Neutrophils Relative %: 50 %
Platelet Count: 206 10*3/uL (ref 150–400)
RBC: 2.93 MIL/uL — ABNORMAL LOW (ref 4.22–5.81)
RDW: 15.7 % — ABNORMAL HIGH (ref 11.5–15.5)
WBC Count: 3.2 10*3/uL — ABNORMAL LOW (ref 4.0–10.5)
nRBC: 0 % (ref 0.0–0.2)

## 2022-09-27 MED ORDER — SODIUM CHLORIDE 0.9 % IV SOLN
20.0000 mg | Freq: Once | INTRAVENOUS | Status: AC
  Administered 2022-09-27: 20 mg via INTRAVENOUS
  Filled 2022-09-27: qty 20

## 2022-09-27 MED ORDER — DIPHENHYDRAMINE HCL 25 MG PO CAPS
50.0000 mg | ORAL_CAPSULE | Freq: Once | ORAL | Status: AC
  Administered 2022-09-27: 50 mg via ORAL
  Filled 2022-09-27: qty 2

## 2022-09-27 MED ORDER — SODIUM CHLORIDE 0.9 % IV SOLN
16.0000 mg/kg | Freq: Once | INTRAVENOUS | Status: AC
  Administered 2022-09-27: 1200 mg via INTRAVENOUS
  Filled 2022-09-27: qty 60

## 2022-09-27 MED ORDER — FAMOTIDINE IN NACL 20-0.9 MG/50ML-% IV SOLN
20.0000 mg | Freq: Once | INTRAVENOUS | Status: AC
  Administered 2022-09-27: 20 mg via INTRAVENOUS
  Filled 2022-09-27: qty 50

## 2022-09-27 MED ORDER — SODIUM CHLORIDE 0.9 % IV SOLN: Freq: Once | INTRAVENOUS | Status: AC

## 2022-09-27 MED ORDER — ACETAMINOPHEN 325 MG PO TABS
650.0000 mg | ORAL_TABLET | Freq: Once | ORAL | Status: AC
  Administered 2022-09-27: 650 mg via ORAL
  Filled 2022-09-27: qty 2

## 2022-09-27 NOTE — Progress Notes (Signed)
HEMATOLOGY/ONCOLOGY CLINIC NOTE  Date of Service: 09/27/22   Patient Care Team: [name], MD as PCP - General (Internal Medicine)  CHIEF COMPLAINTS/PURPOSE OF CONSULTATION:  Follow-up for continued evaluation and management of relapsed multiple myeloma  INTERVAL HISTORY:   [name] is a 77 y.o. male is here for continued evaluation and management of his relapsed multiple myeloma. He was last seen by Dr.  Limbo on 08/30/2022.   [name] reports that he is tolerating treatment without any new or concerning symptoms. He reports stable energy and appetite. He is able to complete his ADLS on his own. He denies nausea, vomiting or abdominal pain. He does have occasional episodes of constipation. He denies easy bruising or signs of active bleeding. HE continues to have chronic neck and low back pain. He is under the care of orthopedic surgery and is planning to start physical therapy on 10/05/2022. He continues to have occasional neuropathy involving his fingers and toes. He denies any interference with grip or balance. He denies fevers, chills,sweats, shortness of breath, chest pain or cough. He has no other complaints.   MEDICAL HISTORY:  Past Medical History:  Diagnosis Date   Anemia    Arthritis    shoulders,feet    Cataract    per eye dr -has appt 5-11   CKD (chronic kidney disease), stage III (HCC)    Elevated PSA    History of ketoacidosis    03-29-2015    History of sepsis    07-25-2013  non-compliant w/ medication   Hyperlipidemia    Hypertension    Nocturia    Peripheral neuropathy    Prostate cancer ([place])    Type 2 diabetes mellitus with insulin therapy [facility])     SURGICAL HISTORY: Past Surgical History:  Procedure Laterality Date   CIRCUMCISION  1990's   PROSTATE BIOPSY N/A 12/21/2015   Procedure: BIOPSY TRANSRECTAL ULTRASONIC PROSTATE (TUBP);  Surgeon: Festus Aloe, MD;  Location: [facility];  Service: Urology;  Laterality:  N/A;    SOCIAL HISTORY: Social History   Socioeconomic History   Marital status: Single    Spouse name: Not on file   Number of children: Not on file   Years of education: Not on file   Highest education level: Not on file  Occupational History   Not on file  Tobacco Use   Smoking status: Every Day    Packs/day: 0.50    Years: 50.00    Additional pack years: 0.00    Total pack years: 25.00    Types: Cigarettes   Smokeless tobacco: Never   Tobacco comments:    1 ppwk-4-5 a day   Vaping Use   Vaping Use: Never used  Substance and Sexual Activity   Alcohol use: Yes    Alcohol/week: 1.0 standard drink of alcohol    Types: 1 Cans of beer per week    Comment: 1 every day-quit couple weeks ago   Drug use: No   Sexual activity: Not Currently  Other Topics Concern   Not on file  Social History Narrative   Not on file   Social Determinants of Health   Financial Resource Strain: Not on file  Food Insecurity: No Food Insecurity (09/15/2022)   Hunger Vital Sign    Worried About Running Out of Food in the Last Year: Never true    Ran Out of Food in the Last Year: Never true  Transportation Needs: No Transportation Needs (09/15/2022)  PRAPARE - Hydrologist (Medical): No    Lack of Transportation (Non-Medical): No  Physical Activity: Not on file  Stress: Not on file  Social Connections: Not on file  Intimate Partner Violence: Not on file    FAMILY HISTORY: Family History  Problem Relation Age of Onset   Kidney disease Mother    Cancer Neg Hx    Colon cancer Neg Hx    Colon polyps Neg Hx    Esophageal cancer Neg Hx    Rectal cancer Neg Hx    Stomach cancer Neg Hx     ALLERGIES:  is allergic to other and lisinopril.  MEDICATIONS:  Current Outpatient Medications  Medication Sig Dispense Refill   Accu-Chek Softclix Lancets lancets Use to test blood sugar 3 times daily. 100 each 3   acyclovir (ZOVIRAX) 400 MG tablet Take 1 tablet (400 mg  total) by mouth 2 (two) times daily. 60 tablet 11   amLODipine (NORVASC) 10 MG tablet TAKE 1 TABLET (10 MG TOTAL) BY MOUTH DAILY. 90 tablet 0   aspirin EC 81 MG tablet Take 81 mg by mouth daily. Swallow whole.     b complex vitamins capsule Take 1 capsule by mouth daily. 30 capsule 5   Blood Glucose Monitoring Suppl (ACCU-CHEK GUIDE) w/Device KIT Use to test blood sugar three times daily 1 kit 0   Continuous Blood Gluc Receiver (FREESTYLE LIBRE READER) DEVI Use to check blood sugar 3x daily. E11.42 1 each 0   Continuous Blood Gluc Sensor (FREESTYLE LIBRE SENSOR SYSTEM) MISC Use to check blood sugar 3 times daily. Change sensor every 2 weeks. 2 each 12   dexamethasone (DECADRON) 4 MG tablet 20mg  (5 tabs) Take the day after each dose of daratumumab. Take with breakfast 20 tablet 11   diclofenac Sodium (VOLTAREN) 1 % GEL Apply 4 g topically 4 (four) times daily. 150 g 5   donepezil (ARICEPT) 5 MG tablet TAKE 1 TABLET (5 MG TOTAL) BY MOUTH AT BEDTIME. FOR MEMORY ISSUES 30 tablet 5   ferrous sulfate 325 (65 FE) MG tablet Take 325 mg by mouth daily.     furosemide (LASIX) 20 MG tablet Take 1 tablet (20 mg total) by mouth daily in the morning for 7 days (until 09/08/22). Save remaining tablets. 30 tablet 0   gabapentin (NEURONTIN) 300 MG capsule TAKE 1 CAPSULE BY MOUTH IN THE MORNING AND LUNCH AND 2 AT BEDTIME 120 capsule 6   glucose blood (ACCU-CHEK GUIDE) test strip Use to test blood sugar 3 times daily. 100 each 2   hydrOXYzine (ATARAX) 25 MG tablet TAKE 1 TABLET (25 MG TOTAL) BY MOUTH 3 (THREE) TIMES DAILY AS NEEDED FOR ITCHING (RASH). 30 tablet 0   Insulin Lispro Prot & Lispro (HUMALOG MIX 75/25 KWIKPEN) (75-25) 100 UNIT/ML Kwikpen Inject 8 units subcutaneously with breakfast and dinner. Increase to 10 units for 3-5 days twice a month after dexamethasone. 15 mL 11   Insulin Pen Needle (TECHLITE PEN NEEDLES) 32G X 4 MM MISC USE AS DIRECTED TWICE DAILY WITH INSULIN 100 each 1   Miconazole Nitrate  (ATHLETES FOOT POWDER SPRAY) 2 % AERP Spray between toes and under toes once daily. 130 g 4   Multiple Vitamin (MULTIVITAMIN WITH MINERALS) TABS tablet Take 1 tablet by mouth daily. 60 tablet 2   ondansetron (ZOFRAN) 8 MG tablet Take 1 tablet (8 mg total) by mouth 2 (two) times daily as needed (Nausea or vomiting). 30 tablet 1   polyethylene  glycol (MIRALAX) 17 g packet Take 17 g by mouth daily. 30 each 2   pomalidomide (POMALYST) 2 MG capsule Take 1 capsule (2 mg total) by mouth daily. 3 weeks on 1 week off 21 capsule 1   pravastatin (PRAVACHOL) 20 MG tablet Take 1 tablet (20 mg total) by mouth daily. 90 tablet 0   prochlorperazine (COMPAZINE) 10 MG tablet Take 1 tablet (10 mg total) by mouth every 6 (six) hours as needed (Nausea or vomiting). 30 tablet 1   tadalafil (CIALIS) 20 MG tablet Take 20 mg by mouth as needed.     tiZANidine (ZANAFLEX) 2 MG tablet Take 1 tablet (2 mg total) by mouth at bedtime as needed for muscle spasms. 30 tablet 1   valsartan (DIOVAN) 40 MG tablet Take 1 tablet (40 mg total) by mouth daily. STOP LISINOPRIL 90 tablet 3   vitamin B-12 (CYANOCOBALAMIN) 100 MCG tablet Take 100 mcg by mouth daily.     No current facility-administered medications for this visit.    ROS 10 Point review of Systems was done is negative except as noted above.  PHYSICAL EXAMINATION: .BP 134/63 (BP Location: Left Arm, Patient Position: Sitting)   Pulse 77   Temp 97.6 F (36.4 C) (Temporal)   Resp 16   Wt 175 lb 14.4 oz (79.8 kg)   SpO2 100%   BMI 27.55 kg/m  . GENERAL:alert, in no acute distress and comfortable SKIN: no acute rashes, no significant lesions EYES: conjunctiva are pink and non-injected, sclera anicteric OROPHARYNX: MMM, no exudates, no oropharyngeal erythema or ulceration LYMPH:  no palpable lymphadenopathy in the cervical or supraclavicular regions LUNGS: clear to auscultation b/l with normal respiratory effort HEART: regular rate & rhythm Extremity: no pedal  edema PSYCH: alert & oriented x 3 with fluent speech NEURO: no focal motor/sensory deficits   LABORATORY DATA:  I have reviewed the data as listed  .    Latest Ref Rng & Units 09/27/2022    9:20 AM 09/13/2022    7:24 AM 08/30/2022    9:16 AM  CBC  WBC 4.0 - 10.5 K/uL 3.2  3.6  3.9   Hemoglobin 13.0 - 17.0 g/dL 10.1  12.0  10.0   Hematocrit 39.0 - 52.0 % 29.4  35.4  29.8   Platelets 150 - 400 K/uL 206  228  205       Latest Ref Rng & Units 09/27/2022    9:20 AM 09/13/2022    7:24 AM 08/30/2022    9:16 AM  CMP  Glucose 70 - 99 mg/dL 179  201  274   BUN 8 - 23 mg/dL 12  20  13    Creatinine 0.61 - 1.24 mg/dL 1.32  1.95  1.41   Sodium 135 - 145 mmol/L 137  136  138   Potassium 3.5 - 5.1 mmol/L 3.9  3.7  3.5   Chloride 98 - 111 mmol/L 107  102  107   CO2 22 - 32 mmol/L 23  24  24    Calcium 8.9 - 10.3 mg/dL 9.1  9.7  8.6   Total Protein 6.5 - 8.1 g/dL 6.0  7.0  6.1   Total Bilirubin 0.3 - 1.2 mg/dL 0.5  0.9  0.5   Alkaline Phos 38 - 126 U/L 77  81  67   AST 15 - 41 U/L 20  19  20    ALT 0 - 44 U/L 28  25  26       08/29/17 BM Bx:  08/29/17 Cytogenetics:   03/13/17 Cytogenetics:        PATHOLOGY Surgical Pathology  CASE: [id number]  PATIENT: Gabrial Uno  Bone Marrow Report      Clinical History: Multiple myeloma in relapse ([place]) ,([facility])      DIAGNOSIS:   BONE MARROW, ASPIRATE, CLOT, CORE:  -Hypercellular bone marrow for age with plasma cell neoplasm  -See comment   PERIPHERAL BLOOD:  -Mild normocytic-normochromic anemia  -Eosinophilia   COMMENT:   The bone marrow is hypercellular for age with increased number of plasma  cells representing 8% of all cells in the aspirate associated with  numerous small clusters in the clot and biopsy sections. The plasma  cells display lambda light chain restriction consistent with plasma cell  neoplasm.  The background shows trilineage hematopoiesis with generally  nonspecific myeloid changes, likely secondary in  nature in this setting.  Correlation with cytogenetic and FISH studies is recommended.      RADIOGRAPHIC STUDIES: I have personally reviewed the radiological images as listed and agreed with the findings in the report. No results found.  ASSESSMENT & PLAN:   77 y.o. male with   1.  Relapsed IgA Lambda Multiple Myeloma ISS Stage I  -Active disease was previously diagnosed based on presence of anemia, kidney insufficiency, and paraproteinemia with significant predominance of lambda light chains as well as significant elevation of IgA. -Patient was treated with induction bortezomib Revlimid dexamethasone.  Had issues with skin rashes with Revlimid and this was discontinued.  Completed bortezomib dexamethasone induction and achieved complete remission. Was on maintenance Velcade 2 weeks which was then switched to maintenance Ninlaro. -Patient had issues with worsening neuropathy which was a combination of Velcade and his diabetes.  Ninlaro was discontinued as well after maintenance of more than 2 years. -Found to have relapse myeloma with bone marrow biopsy from 06/13/2021 showed 8% lambda restricted plasma cells. It is high risk IgA lambda multiple myeloma with duplication of 1 q. and atypical t(4;14) which are both high risk mutations. -PET CT scan from 05/20/2021 showed no hypermetabolic osseous lesions -Started Daratumumab on 07/14/2021.  -Added Pomalyst on 03/02/2022 due to concern for disease progress with increase in M protein to 0.5 on 12/30/21.   3.  Hypertension 4.  Diabetes type 2 5 .chronic kidney disease due to hypertension and diabetes. 6.  Peripheral neuropathy related to diabetes and previous myeloma treatments.  PLAN: -Due for Daratumumab today while on Pomalyst.  -Labs from today were reviewed and adequate for treatment. WBC 3.2, Hgb 10.1, MCV 100.3, Plt 206. Creatinine improved to 1.32, calcium levels normal. LFTs normal -Most recent myeloma labs from 08/16/2021 shows that M  protein was not detected. -We will continue treatment without any dose modifications.   FOLLOW-UP: Per integrated scheduling   All of the patient's questions were answered with apparent satisfaction. The patient knows to call the clinic with any problems, questions or concerns.  I have spent a total of 30 minutes minutes of face-to-face and non-face-to-face time, preparing to see the patient,  performing a medically appropriate examination, counseling and educating the patient, ordering tests/procedures, documenting clinical information in the electronic health recordand care coordination.   Dede Query PA-C Dept of Hematology and [place] at [facility] Phone: [phone]

## 2022-09-27 NOTE — Patient Instructions (Signed)
[facility]  Discharge Instructions: Thank you for choosing [facility] to provide your oncology and hematology care.   If you have a lab appointment with the Cancer Center, please go directly to the Cancer Center and check in at the registration area.   Wear comfortable clothing and clothing appropriate for easy access to any Portacath or PICC line.   We strive to give you quality time with your provider. You may need to reschedule your appointment if you arrive late (15 or more minutes).  Arriving late affects you and other patients whose appointments are after yours.  Also, if you miss three or more appointments without notifying the office, you may be dismissed from the clinic at the provider's discretion.      For prescription refill requests, have your pharmacy contact our office and allow 72 hours for refills to be completed.    Today you received the following chemotherapy and/or immunotherapy agents: Darzalex    To help prevent nausea and vomiting after your treatment, we encourage you to take your nausea medication as directed.  BELOW ARE SYMPTOMS THAT SHOULD BE REPORTED IMMEDIATELY: *FEVER GREATER THAN 100.4 F (38 C) OR HIGHER *CHILLS OR SWEATING *NAUSEA AND VOMITING THAT IS NOT CONTROLLED WITH YOUR NAUSEA MEDICATION *UNUSUAL SHORTNESS OF BREATH *UNUSUAL BRUISING OR BLEEDING *URINARY PROBLEMS (pain or burning when urinating, or frequent urination) *BOWEL PROBLEMS (unusual diarrhea, constipation, pain near the anus) TENDERNESS IN MOUTH AND THROAT WITH OR WITHOUT PRESENCE OF ULCERS (sore throat, sores in mouth, or a toothache) UNUSUAL RASH, SWELLING OR PAIN  UNUSUAL VAGINAL DISCHARGE OR ITCHING   Items with * indicate a potential emergency and should be followed up as soon as possible or go to the Emergency Department if any problems should occur.  Please show the CHEMOTHERAPY ALERT CARD or IMMUNOTHERAPY ALERT CARD at check-in  to the Emergency Department and triage nurse.  Should you have questions after your visit or need to cancel or reschedule your appointment, please contact [facility]  Dept: [phone]  and follow the prompts.  Office hours are 8:00 a.m. to 4:30 p.m. Monday - Friday. Please note that voicemails left after 4:00 p.m. may not be returned until the following business day.  We are closed weekends and major holidays. You have access to a nurse at all times for urgent questions. Please call the main number to the clinic Dept: [phone] and follow the prompts.   For any non-urgent questions, you may also contact your provider using MyChart. We now offer e-Visits for anyone 18 and older to request care online for non-urgent symptoms. For details visit [email or web address].   Also download the MyChart app! Go to the app store, search "MyChart", open the app, select [facility], and log in with your MyChart username and password.   

## 2022-10-03 LAB — MULTIPLE MYELOMA PANEL, SERUM
Albumin SerPl Elph-Mcnc: 3.5 g/dL (ref 2.9–4.4)
Albumin/Glob SerPl: 1.6 (ref 0.7–1.7)
Alpha 1: 0.2 g/dL (ref 0.0–0.4)
Alpha2 Glob SerPl Elph-Mcnc: 0.7 g/dL (ref 0.4–1.0)
B-Globulin SerPl Elph-Mcnc: 0.8 g/dL (ref 0.7–1.3)
Gamma Glob SerPl Elph-Mcnc: 0.5 g/dL (ref 0.4–1.8)
Globulin, Total: 2.2 g/dL (ref 2.2–3.9)
IgA: 107 mg/dL (ref 61–437)
IgG (Immunoglobin G), Serum: 497 mg/dL — ABNORMAL LOW (ref 603–1613)
IgM (Immunoglobulin M), Srm: 31 mg/dL (ref 15–143)
Total Protein ELP: 5.7 g/dL — ABNORMAL LOW (ref 6.0–8.5)

## 2022-10-04 ENCOUNTER — Other Ambulatory Visit: Payer: Self-pay

## 2022-10-04 ENCOUNTER — Ambulatory Visit (INDEPENDENT_AMBULATORY_CARE_PROVIDER_SITE_OTHER): Payer: 59 | Admitting: Podiatrist

## 2022-10-04 DIAGNOSIS — L84 Corns and callosities: Secondary | ICD-10-CM | POA: Diagnosis not present

## 2022-10-04 DIAGNOSIS — M79674 Pain in right toe(s): Secondary | ICD-10-CM | POA: Diagnosis not present

## 2022-10-04 DIAGNOSIS — M79675 Pain in left toe(s): Secondary | ICD-10-CM

## 2022-10-04 DIAGNOSIS — B351 Tinea unguium: Secondary | ICD-10-CM

## 2022-10-04 DIAGNOSIS — E1142 Type 2 diabetes mellitus with diabetic polyneuropathy: Secondary | ICD-10-CM

## 2022-10-04 MED ORDER — KETOCONAZOLE 2 % EX CREA
TOPICAL_CREAM | CUTANEOUS | 2 refills | Status: DC
  Filled 2022-10-04: qty 60, 30d supply, fill #0
  Filled 2023-03-07: qty 60, 30d supply, fill #1

## 2022-10-04 NOTE — Progress Notes (Signed)
  Subjective:  Patient ID: Robert Sparks, male    DOB: [birth year],  MRN: [id number]  Robert Sparks presents to clinic today for at risk foot care with history of diabetic neuropathy and callus(es) left lower extremity, porokeratotic lesion(s) right lower extremity, and painful mycotic nails. Painful toenails interfere with ambulation. Aggravating factors include wearing enclosed shoe gear. Pain is relieved with periodic professional debridement. Painful callus(es) and porokeratotic lesion(s) are aggravated when weightbearing with and without shoegear. Pain is relieved with periodic professional debridement.  Chief Complaint  Patient presents with   Nail Problem    DFC BS-did not check today A1C- patient forgot PCP-Deborah Wynetta Emery PCP VST- 2or 3 weeks ago   New problem(s): None.   PCP is [name], MD.  Allergies  Allergen Reactions   Other Other (See Comments)    Nicoderm CQ = Bad Dreams Nicotine gum = Bad Dreams    Lisinopril Cough    Review of Systems: Negative except as noted in the HPI.  Objective: No changes noted in today's physical examination.  Robert Sparks is a pleasant 77 y.o. male WD, WN in NAD. AAO x 3.  Vascular Examination: CFT immediate b/l LE. Palpable DP/PT pulses b/l LE. Digital hair present b/l. Skin temperature gradient WNL b/l. No pain with calf compression b/l. No edema noted b/l. No cyanosis or clubbing noted b/l LE.  Dermatological Examination: Pedal skin warm and supple b/l.  No open wounds b/l. No interdigital macerations. Toenails 1-5 b/l elongated, thickened, discolored with subungual debris. +Tenderness with dorsal palpation of nailplates.   Hyperkeratotic lesion(s) noted 1st metatarsal head left foot.    Porokeratotic lesion(s) noted R hallux. No erythema, no edema, no drainage, no fluctuance.  Musculoskeletal Examination: Normal muscle strength 5/5 to all lower extremity muscle groups bilaterally. HAV with bunion deformity  noted b/l LE.[name] No pain, crepitus or joint limitation noted with ROM b/l LE.  Patient ambulates independently without assistive aids.  Neurological Examination: Protective sensation diminished with 10g monofilament b/l. Vibratory sensation intact b/l.  Assessment/Plan:   ICD-10-CM   1. Pain due to onychomycosis of toenails of both feet  B35.1    M79.675    M79.674     2. Diabetic peripheral neuropathy associated with type 2 diabetes mellitus (HCC)  E11.42     3. Callus  L84         -Consent given for treatment as described below: -Patient to continue soft, supportive shoe gear daily. -Toenails 1-5 b/l were debrided in length and girth with sterile nail nippers and dremel without iatrogenic bleeding.  -Callus(es) 1st metatarsal head left lower extremity pared utilizing sterile scalpel blade without complication or incident. Total number debrided =1. -Porokeratotic lesion(s) right great toe pared and enucleated with sterile currette without incident. Total number of lesions debrided=1. -Patient/POA to call should there be question/concern in the interim.    Bronson Ing, DPM

## 2022-10-05 ENCOUNTER — Ambulatory Visit (INDEPENDENT_AMBULATORY_CARE_PROVIDER_SITE_OTHER): Payer: 59 | Admitting: Orthopedic Surgery

## 2022-10-05 ENCOUNTER — Other Ambulatory Visit: Payer: Self-pay

## 2022-10-05 DIAGNOSIS — M48062 Spinal stenosis, lumbar region with neurogenic claudication: Secondary | ICD-10-CM

## 2022-10-05 DIAGNOSIS — M542 Cervicalgia: Secondary | ICD-10-CM | POA: Diagnosis not present

## 2022-10-05 NOTE — Progress Notes (Signed)
Orthopedic Spine Surgery Office Note  Assessment: Patient is a 77 y.o. male with chronic axial neck pain with no radicular myelopathic symptoms.  Also, has low back and buttock pain that is only present when ambulating improves with sitting.  Possible neurogenic claudication   Plan: -Explained that initially conservative treatment is tried as a significant number of patients may experience relief with these treatment modalities. Discussed that the conservative treatments include:  -activity modification  -physical therapy  -over the counter pain medications  -medrol dosepak  -steroid injections -Patient has tried NSAIDs, Tylenol, Voltaren gel, PT -Recommended MRI of the lumbar spine to evaluate for lumbar stenosis -Referred him to pain management for his chronic neck pain with no radicular or myelopathic symptoms -Would need an A1c of 7.5 or less and be nicotine free before elective surgery would be considered as a treatment option -Patient should return to office in 5 weeks, x-rays at next visit: None   Patient expressed understanding of the plan and all questions were answered to the patient's satisfaction.   __________________________________________________________________________  History: Patient is a 77 y.o. male who has been previously seen in the office for symptoms of neck pain and low back pain.  Today, he comes in with continued neck pain.  Pain is felt in the midline of the neck.  There is no trauma to that brought the pain.  It does not radiate into either upper extremity.  He has not had any issues with hand dexterity.  He has not noticed any trouble with imbalance.  The numbness and tingling he complained of last time in his right hand is similar.  He is still able to shake it out and it is not bothering him that much.  No other numbness or paresthesias in the upper extremities.  He also wanted talk about his low back.  He has low back pain that radiates into the bilateral  paraspinal muscles and the buttock. It used to be mostly on the let side but now he feels it bilaterally.  He states he has this pain only when he is upright or walking.  He said he can walk for 2 to 3 minutes before it starts.  As soon as he sits down, it gets better.  He has not noticed any pain radiating past the buttock on either side.  Denies paresthesia numbness in the lower extremities.  Previous treatments:   Physical Exam:  General: no acute distress, appears stated age Neurologic: alert, answering questions appropriately, following commands Respiratory: unlabored breathing on room air, symmetric chest rise Psychiatric: appropriate affect, normal cadence to speech   MSK (spine):   -Strength exam                                                   Left                  Right Grip strength                5/5                  5/5 Interosseus                  5/5                  5/5 Wrist extension  5/5                  5/5 Wrist flexion                 5/5                  5/5 Elbow flexion                5/5                  5/5 Deltoid                          5/5                  5/5   EHL                              5/5                  5/5 TA                                 5/5                  5/5 GSC                             5/5                  5/5 Knee extension            5/5                  5/5 Hip flexion                    5/5                  5/5   -Sensory exam                           Sensation intact to light touch in L3-S1 nerve distributions of bilateral lower extremities             Sensation intact to light touch in C5-T1 nerve distributions of bilateral upper extremities   -Brachioradialis DTR: 2/4 on the left, 2/4 on the right -Biceps DTR: 2/4 on the left, 2/4 on the right -Achilles DTR: 2/4 on the left, 2/4 on the right -Patellar tendon DTR: 1/4 on the left, 1/4 on the right   -Spurling: Negative bilaterally -Hoffman sign: Negative  bilaterally -Clonus: No beats bilaterally -Interosseous wasting: None seen -Grip and release test: Negative -Romberg: Negative -Gait: Normal -Imbalance with tandem gait: Yes  Left shoulder exam: Decreased range of motion but no pain through range of motion, negative drop arm sign, negative Jobe test, no weakness with external rotation with arm at side, negative belly press Right shoulder exam: Decreased range of motion but no pain through range of motion, negative drop arm sign, negative Jobe test, no weakness with external rotation with arm at side, negative belly press  -Right hip exam: No pain through range of motion, negative Stinchfield, negative FABER -Left hip exam: No pain through range of motion, negative Stinchfield, negative FABER -Palpable DP pulses  bilaterally  Imaging: XR of the cervical spine from 08/25/2022 was previously independently reviewed and interpreted, showing disc height loss at C4/5, C5/6, C6/7 with anterior osteophyte formation at those levels (most significant at C4/5 and C5/6).  Loss of cervical lordosis.  No fracture or dislocation seen.  No evidence of instability on flexion/extension views.   XR of the lumbar spine from 08/25/2022 was previously independently reviewed and interpreted, showing joint space narrowing in the bilateral hips with subchondral sclerosis noted on the right hip.  Disc height loss at L3/4 and L4/5.  No evidence of instability on flexion/extension views.  No fracture or dislocation seen.   Patient name: Robert Sparks Patient MRN: [id number] Date of visit: 10/05/22

## 2022-10-06 ENCOUNTER — Other Ambulatory Visit: Payer: Self-pay

## 2022-10-09 ENCOUNTER — Other Ambulatory Visit: Payer: Self-pay

## 2022-10-10 ENCOUNTER — Other Ambulatory Visit: Payer: Self-pay

## 2022-10-10 MED FILL — Dexamethasone Sodium Phosphate Inj 100 MG/10ML: INTRAMUSCULAR | Qty: 2 | Status: AC

## 2022-10-11 ENCOUNTER — Inpatient Hospital Stay: Payer: 59

## 2022-10-11 ENCOUNTER — Other Ambulatory Visit: Payer: Self-pay

## 2022-10-11 ENCOUNTER — Inpatient Hospital Stay: Payer: 59 | Attending: Hematology

## 2022-10-11 VITALS — BP 138/69 | HR 75 | Temp 98.4°F | Resp 18 | Wt 173.0 lb

## 2022-10-11 DIAGNOSIS — Z5112 Encounter for antineoplastic immunotherapy: Secondary | ICD-10-CM | POA: Diagnosis not present

## 2022-10-11 DIAGNOSIS — Z7189 Other specified counseling: Secondary | ICD-10-CM

## 2022-10-11 DIAGNOSIS — C9002 Multiple myeloma in relapse: Secondary | ICD-10-CM | POA: Insufficient documentation

## 2022-10-11 LAB — COMPREHENSIVE METABOLIC PANEL
ALT: 30 U/L (ref 0–44)
AST: 21 U/L (ref 15–41)
Albumin: 3.9 g/dL (ref 3.5–5.0)
Alkaline Phosphatase: 94 U/L (ref 38–126)
Anion gap: 6 (ref 5–15)
BUN: 13 mg/dL (ref 8–23)
CO2: 25 mmol/L (ref 22–32)
Calcium: 9.5 mg/dL (ref 8.9–10.3)
Chloride: 107 mmol/L (ref 98–111)
Creatinine, Ser: 1.52 mg/dL — ABNORMAL HIGH (ref 0.61–1.24)
GFR, Estimated: 48 mL/min — ABNORMAL LOW (ref >60)
Glucose, Bld: 173 mg/dL — ABNORMAL HIGH (ref 70–99)
Potassium: 4 mmol/L (ref 3.5–5.1)
Sodium: 138 mmol/L (ref 135–145)
Total Bilirubin: 0.6 mg/dL (ref 0.3–1.2)
Total Protein: 6.5 g/dL (ref 6.5–8.1)

## 2022-10-11 LAB — CBC WITH DIFFERENTIAL (CANCER CENTER ONLY)
Abs Immature Granulocytes: 0.02 10*3/uL (ref 0.00–0.07)
Basophils Absolute: 0.1 10*3/uL (ref 0.0–0.1)
Basophils Relative: 2 %
Eosinophils Absolute: 0.3 10*3/uL (ref 0.0–0.5)
Eosinophils Relative: 8 %
HCT: 29.9 % — ABNORMAL LOW (ref 39.0–52.0)
Hemoglobin: 10.5 g/dL — ABNORMAL LOW (ref 13.0–17.0)
Immature Granulocytes: 1 %
Lymphocytes Relative: 23 %
Lymphs Abs: 0.9 10*3/uL (ref 0.7–4.0)
MCH: 34.9 pg — ABNORMAL HIGH (ref 26.0–34.0)
MCHC: 35.1 g/dL (ref 30.0–36.0)
MCV: 99.3 fL (ref 80.0–100.0)
Monocytes Absolute: 0.3 10*3/uL (ref 0.1–1.0)
Monocytes Relative: 6 %
Neutro Abs: 2.4 10*3/uL (ref 1.7–7.7)
Neutrophils Relative %: 60 %
Platelet Count: 240 10*3/uL (ref 150–400)
RBC: 3.01 MIL/uL — ABNORMAL LOW (ref 4.22–5.81)
RDW: 15.2 % (ref 11.5–15.5)
WBC Count: 3.9 10*3/uL — ABNORMAL LOW (ref 4.0–10.5)
nRBC: 0 % (ref 0.0–0.2)

## 2022-10-11 MED ORDER — SODIUM CHLORIDE 0.9 % IV SOLN
20.0000 mg | Freq: Once | INTRAVENOUS | Status: AC
  Administered 2022-10-11: 20 mg via INTRAVENOUS
  Filled 2022-10-11: qty 20

## 2022-10-11 MED ORDER — ACETAMINOPHEN 325 MG PO TABS
650.0000 mg | ORAL_TABLET | Freq: Once | ORAL | Status: AC
  Administered 2022-10-11: 650 mg via ORAL
  Filled 2022-10-11: qty 2

## 2022-10-11 MED ORDER — FAMOTIDINE IN NACL 20-0.9 MG/50ML-% IV SOLN
20.0000 mg | Freq: Once | INTRAVENOUS | Status: AC
  Administered 2022-10-11: 20 mg via INTRAVENOUS
  Filled 2022-10-11: qty 50

## 2022-10-11 MED ORDER — SODIUM CHLORIDE 0.9 % IV SOLN: Freq: Once | INTRAVENOUS | Status: AC

## 2022-10-11 MED ORDER — SODIUM CHLORIDE 0.9 % IV SOLN
16.0000 mg/kg | Freq: Once | INTRAVENOUS | Status: AC
  Administered 2022-10-11: 1200 mg via INTRAVENOUS
  Filled 2022-10-11: qty 60

## 2022-10-11 MED ORDER — DIPHENHYDRAMINE HCL 25 MG PO CAPS
50.0000 mg | ORAL_CAPSULE | Freq: Once | ORAL | Status: AC
  Administered 2022-10-11: 50 mg via ORAL
  Filled 2022-10-11: qty 2

## 2022-10-11 NOTE — Patient Instructions (Signed)
[facility]  Discharge Instructions: Thank you for choosing [facility] to provide your oncology and hematology care.   If you have a lab appointment with the Cancer Center, please go directly to the Cancer Center and check in at the registration area.   Wear comfortable clothing and clothing appropriate for easy access to any Portacath or PICC line.   We strive to give you quality time with your provider. You may need to reschedule your appointment if you arrive late (15 or more minutes).  Arriving late affects you and other patients whose appointments are after yours.  Also, if you miss three or more appointments without notifying the office, you may be dismissed from the clinic at the provider's discretion.      For prescription refill requests, have your pharmacy contact our office and allow 72 hours for refills to be completed.    Today you received the following chemotherapy and/or immunotherapy agents darzalex      To help prevent nausea and vomiting after your treatment, we encourage you to take your nausea medication as directed.  BELOW ARE SYMPTOMS THAT SHOULD BE REPORTED IMMEDIATELY: *FEVER GREATER THAN 100.4 F (38 C) OR HIGHER *CHILLS OR SWEATING *NAUSEA AND VOMITING THAT IS NOT CONTROLLED WITH YOUR NAUSEA MEDICATION *UNUSUAL SHORTNESS OF BREATH *UNUSUAL BRUISING OR BLEEDING *URINARY PROBLEMS (pain or burning when urinating, or frequent urination) *BOWEL PROBLEMS (unusual diarrhea, constipation, pain near the anus) TENDERNESS IN MOUTH AND THROAT WITH OR WITHOUT PRESENCE OF ULCERS (sore throat, sores in mouth, or a toothache) UNUSUAL RASH, SWELLING OR PAIN  UNUSUAL VAGINAL DISCHARGE OR ITCHING   Items with * indicate a potential emergency and should be followed up as soon as possible or go to the Emergency Department if any problems should occur.  Please show the CHEMOTHERAPY ALERT CARD or IMMUNOTHERAPY ALERT CARD at  check-in to the Emergency Department and triage nurse.  Should you have questions after your visit or need to cancel or reschedule your appointment, please contact [facility]  Dept: [phone]  and follow the prompts.  Office hours are 8:00 a.m. to 4:30 p.m. Monday - Friday. Please note that voicemails left after 4:00 p.m. may not be returned until the following business day.  We are closed weekends and major holidays. You have access to a nurse at all times for urgent questions. Please call the main number to the clinic Dept: [phone] and follow the prompts.   For any non-urgent questions, you may also contact your provider using MyChart. We now offer e-Visits for anyone 18 and older to request care online for non-urgent symptoms. For details visit [email or web address].   Also download the MyChart app! Go to the app store, search "MyChart", open the app, select [facility], and log in with your MyChart username and password.   

## 2022-10-19 ENCOUNTER — Other Ambulatory Visit: Payer: Self-pay

## 2022-10-19 ENCOUNTER — Ambulatory Visit
Admission: RE | Admit: 2022-10-19 | Discharge: 2022-10-19 | Disposition: A | Discharge status: Home / Self Care | Payer: 59 | Source: Ambulatory Visit | Attending: Orthopedic Surgery | Admitting: Orthopedic Surgery

## 2022-10-19 DIAGNOSIS — M48062 Spinal stenosis, lumbar region with neurogenic claudication: Secondary | ICD-10-CM

## 2022-10-19 DIAGNOSIS — M48061 Spinal stenosis, lumbar region without neurogenic claudication: Secondary | ICD-10-CM | POA: Diagnosis not present

## 2022-10-19 DIAGNOSIS — M545 Low back pain, unspecified: Secondary | ICD-10-CM | POA: Diagnosis not present

## 2022-10-19 DIAGNOSIS — C9002 Multiple myeloma in relapse: Secondary | ICD-10-CM

## 2022-10-19 MED ORDER — POMALIDOMIDE 2 MG PO CAPS
2.0000 mg | ORAL_CAPSULE | Freq: Every day | ORAL | 1 refills | Status: DC
Start: 2022-10-19 — End: 2022-11-21

## 2022-10-24 MED FILL — Dexamethasone Sodium Phosphate Inj 100 MG/10ML: INTRAMUSCULAR | Qty: 2 | Status: AC

## 2022-10-25 ENCOUNTER — Inpatient Hospital Stay (HOSPITAL_BASED_OUTPATIENT_CLINIC_OR_DEPARTMENT_OTHER): Payer: 59 | Admitting: Hematology

## 2022-10-25 ENCOUNTER — Inpatient Hospital Stay: Payer: 59

## 2022-10-25 ENCOUNTER — Other Ambulatory Visit: Payer: Self-pay

## 2022-10-25 VITALS — BP 130/61 | HR 80 | Temp 97.5°F | Resp 16 | Ht 67.0 in | Wt 175.9 lb

## 2022-10-25 VITALS — BP 131/61 | HR 71 | Resp 16

## 2022-10-25 DIAGNOSIS — C9002 Multiple myeloma in relapse: Secondary | ICD-10-CM

## 2022-10-25 DIAGNOSIS — Z7189 Other specified counseling: Secondary | ICD-10-CM | POA: Diagnosis not present

## 2022-10-25 DIAGNOSIS — Z5112 Encounter for antineoplastic immunotherapy: Secondary | ICD-10-CM | POA: Diagnosis not present

## 2022-10-25 LAB — COMPREHENSIVE METABOLIC PANEL
ALT: 25 U/L (ref 0–44)
AST: 19 U/L (ref 15–41)
Albumin: 4 g/dL (ref 3.5–5.0)
Alkaline Phosphatase: 80 U/L (ref 38–126)
Anion gap: 6 (ref 5–15)
BUN: 18 mg/dL (ref 8–23)
CO2: 24 mmol/L (ref 22–32)
Calcium: 9.6 mg/dL (ref 8.9–10.3)
Chloride: 107 mmol/L (ref 98–111)
Creatinine, Ser: 1.54 mg/dL — ABNORMAL HIGH (ref 0.61–1.24)
GFR, Estimated: 47 mL/min — ABNORMAL LOW (ref >60)
Glucose, Bld: 161 mg/dL — ABNORMAL HIGH (ref 70–99)
Potassium: 4.1 mmol/L (ref 3.5–5.1)
Sodium: 137 mmol/L (ref 135–145)
Total Bilirubin: 0.5 mg/dL (ref 0.3–1.2)
Total Protein: 6.6 g/dL (ref 6.5–8.1)

## 2022-10-25 LAB — CBC WITH DIFFERENTIAL (CANCER CENTER ONLY)
Abs Immature Granulocytes: 0.01 10*3/uL (ref 0.00–0.07)
Basophils Absolute: 0.1 10*3/uL (ref 0.0–0.1)
Basophils Relative: 1 %
Eosinophils Absolute: 0.3 10*3/uL (ref 0.0–0.5)
Eosinophils Relative: 7 %
HCT: 30.5 % — ABNORMAL LOW (ref 39.0–52.0)
Hemoglobin: 10.2 g/dL — ABNORMAL LOW (ref 13.0–17.0)
Immature Granulocytes: 0 %
Lymphocytes Relative: 30 %
Lymphs Abs: 1.3 10*3/uL (ref 0.7–4.0)
MCH: 33.2 pg (ref 26.0–34.0)
MCHC: 33.4 g/dL (ref 30.0–36.0)
MCV: 99.3 fL (ref 80.0–100.0)
Monocytes Absolute: 0.6 10*3/uL (ref 0.1–1.0)
Monocytes Relative: 15 %
Neutro Abs: 1.9 10*3/uL (ref 1.7–7.7)
Neutrophils Relative %: 47 %
Platelet Count: 207 10*3/uL (ref 150–400)
RBC: 3.07 MIL/uL — ABNORMAL LOW (ref 4.22–5.81)
RDW: 14.6 % (ref 11.5–15.5)
WBC Count: 4.1 10*3/uL (ref 4.0–10.5)
nRBC: 0 % (ref 0.0–0.2)

## 2022-10-25 MED ORDER — SODIUM CHLORIDE 0.9 % IV SOLN
20.0000 mg | Freq: Once | INTRAVENOUS | Status: AC
  Administered 2022-10-25: 20 mg via INTRAVENOUS
  Filled 2022-10-25: qty 20

## 2022-10-25 MED ORDER — DIPHENHYDRAMINE HCL 25 MG PO CAPS
50.0000 mg | ORAL_CAPSULE | Freq: Once | ORAL | Status: AC
  Administered 2022-10-25: 50 mg via ORAL
  Filled 2022-10-25: qty 2

## 2022-10-25 MED ORDER — SODIUM CHLORIDE 0.9 % IV SOLN
16.0000 mg/kg | Freq: Once | INTRAVENOUS | Status: AC
  Administered 2022-10-25: 1200 mg via INTRAVENOUS
  Filled 2022-10-25: qty 60

## 2022-10-25 MED ORDER — FAMOTIDINE IN NACL 20-0.9 MG/50ML-% IV SOLN
20.0000 mg | Freq: Once | INTRAVENOUS | Status: AC
  Administered 2022-10-25: 20 mg via INTRAVENOUS
  Filled 2022-10-25: qty 50

## 2022-10-25 MED ORDER — ACETAMINOPHEN 325 MG PO TABS
650.0000 mg | ORAL_TABLET | Freq: Once | ORAL | Status: AC
  Administered 2022-10-25: 650 mg via ORAL
  Filled 2022-10-25: qty 2

## 2022-10-25 MED ORDER — SODIUM CHLORIDE 0.9 % IV SOLN: Freq: Once | INTRAVENOUS | Status: AC

## 2022-10-25 NOTE — Progress Notes (Signed)
HEMATOLOGY/ONCOLOGY CLINIC NOTE  Date of Service: 10/25/22   Patient Care Team: Marcine Matar, MD as PCP - General (Internal Medicine)  CHIEF COMPLAINTS/PURPOSE OF CONSULTATION:  Follow-up for continued evaluation and management of relapsed multiple myeloma  Oncologic History:  77 y.o. male with diagnosis of IgA lambda active multiple myeloma, ISS Stage I. Active disease diagnosed based on presence of anemia, kidney insufficiency, and paraproteinemia with significant predominance of lambda light chains as well as significant elevation of IgA. Bone marrow biopsy confirmed presence of atypical monoclonal plasma cell process in the bone marrow comprising at least 7% of the cellularity. Based on the findings, patient was started on treatment with lenalidomide, bortezomib, and low-dose dexamethasone based on the anticipated tolerance by the patient. Lenalidomide was started at 10 mg daily based on creatinine clearance of 42, dexamethasone dose was reduced to 20 mg weekly based on patient's age. Treatment was complicated by rapid development of cutaneous rash attributed to lenalidomide, inaddition to decreased renal function and now patient has persistent severe anemia. Side-effects were attributed to Lenalidomide and lenalidomide was discontinued with subsequent recovery.  Patient has been receiving bortezomib weekly with low-dose dexamethasone in 4-day cycles.   Patient has completed induction systemic therapy for his disease with repeat bone marrow biopsy confirming complete response including no evidence of minimal residual disease by cytogenetics or FISH.  HISTORY OF PRESENTING ILLNESS:  Please see previous note for details of initial presentation.  INTERVAL HISTORY:   [name] is a 77 y.o. male is here for continued evaluation and management of his relapsed multiple myeloma.   Patient was last seen by me on 08/30/2022 and complained of occasional cough, occasional  diarrhea, and occasional bilateral hand and bilateral leg neuropathy. He also complained of lower back pain with movement.   Today, he reports that he has been tolerating his treatment and Daratumumab infusions well with no issues. He denies any new rashes, bone pain, or lower abdominal pain. He does complain of constipation sometimes and diarrhea once in a while. Patient does consume plenty of water daily.  Patient continues to have back pain along the waist line. Patient does not lift heavy objects and notes that the pain is not present until he moves around. He notes that the pain does sometimes radiate to his legs. Patient denies any infection issues or new dental issues.  MEDICAL HISTORY:  Past Medical History:  Diagnosis Date   Anemia    Arthritis    shoulders,feet    Cataract    per eye dr -has appt 5-11   CKD (chronic kidney disease), stage III (HCC)    Elevated PSA    History of ketoacidosis    03-29-2015    History of sepsis    07-25-2013  non-compliant w/ medication   Hyperlipidemia    Hypertension    Nocturia    Peripheral neuropathy    Prostate cancer (HCC)    Type 2 diabetes mellitus with insulin therapy [facility])     SURGICAL HISTORY: Past Surgical History:  Procedure Laterality Date   CIRCUMCISION  1990's   PROSTATE BIOPSY N/A 12/21/2015   Procedure: BIOPSY TRANSRECTAL ULTRASONIC PROSTATE (TUBP);  Surgeon: Jerilee Field, MD;  Location: [facility];  Service: Urology;  Laterality: N/A;    SOCIAL HISTORY: Social History   Socioeconomic History   Marital status: Single    Spouse name: Not on file   Number of children: Not on file   Years of education: Not  on file   Highest education level: Not on file  Occupational History   Not on file  Tobacco Use   Smoking status: Every Day    Packs/day: 0.50    Years: 50.00    Additional pack years: 0.00    Total pack years: 25.00    Types: Cigarettes   Smokeless tobacco: Never   Tobacco comments:     1 ppwk-4-5 a day   Vaping Use   Vaping Use: Never used  Substance and Sexual Activity   Alcohol use: Yes    Alcohol/week: 1.0 standard drink of alcohol    Types: 1 Cans of beer per week    Comment: 1 every day-quit couple weeks ago   Drug use: No   Sexual activity: Not Currently  Other Topics Concern   Not on file  Social History Narrative   Not on file   Social Determinants of Health   Financial Resource Strain: Not on file  Food Insecurity: No Food Insecurity (09/15/2022)   Hunger Vital Sign    Worried About Running Out of Food in the Last Year: Never true    Ran Out of Food in the Last Year: Never true  Transportation Needs: No Transportation Needs (09/15/2022)   PRAPARE - Administrator, Civil Service (Medical): No    Lack of Transportation (Non-Medical): No  Physical Activity: Not on file  Stress: Not on file  Social Connections: Not on file  Intimate Partner Violence: Not on file    FAMILY HISTORY: Family History  Problem Relation Age of Onset   Kidney disease Mother    Cancer Neg Hx    Colon cancer Neg Hx    Colon polyps Neg Hx    Esophageal cancer Neg Hx    Rectal cancer Neg Hx    Stomach cancer Neg Hx     ALLERGIES:  is allergic to other and lisinopril.  MEDICATIONS:  Current Outpatient Medications  Medication Sig Dispense Refill   Accu-Chek Softclix Lancets lancets Use to test blood sugar 3 times daily. 100 each 3   acyclovir (ZOVIRAX) 400 MG tablet Take 1 tablet (400 mg total) by mouth 2 (two) times daily. 60 tablet 11   amLODipine (NORVASC) 10 MG tablet TAKE 1 TABLET (10 MG TOTAL) BY MOUTH DAILY. 90 tablet 0   aspirin EC 81 MG tablet Take 81 mg by mouth daily. Swallow whole.     b complex vitamins capsule Take 1 capsule by mouth daily. 30 capsule 5   Blood Glucose Monitoring Suppl (ACCU-CHEK GUIDE) w/Device KIT Use to test blood sugar three times daily 1 kit 0   Continuous Blood Gluc Receiver (FREESTYLE LIBRE READER) DEVI Use to check  blood sugar 3x daily. E11.42 1 each 0   Continuous Blood Gluc Sensor (FREESTYLE LIBRE SENSOR SYSTEM) MISC Use to check blood sugar 3 times daily. Change sensor every 2 weeks. 2 each 12   dexamethasone (DECADRON) 4 MG tablet 20mg  (5 tabs) Take the day after each dose of daratumumab. Take with breakfast 20 tablet 11   diclofenac Sodium (VOLTAREN) 1 % GEL Apply 4 g topically 4 (four) times daily. 150 g 5   donepezil (ARICEPT) 5 MG tablet TAKE 1 TABLET (5 MG TOTAL) BY MOUTH AT BEDTIME. FOR MEMORY ISSUES 30 tablet 5   ferrous sulfate 325 (65 FE) MG tablet Take 325 mg by mouth daily.     furosemide (LASIX) 20 MG tablet Take 1 tablet (20 mg total) by mouth daily in the  morning for 7 days (until 09/08/22). Save remaining tablets. 30 tablet 0   gabapentin (NEURONTIN) 300 MG capsule TAKE 1 CAPSULE BY MOUTH IN THE MORNING AND LUNCH AND 2 AT BEDTIME 120 capsule 6   glucose blood (ACCU-CHEK GUIDE) test strip Use to test blood sugar 3 times daily. 100 each 2   hydrOXYzine (ATARAX) 25 MG tablet TAKE 1 TABLET (25 MG TOTAL) BY MOUTH 3 (THREE) TIMES DAILY AS NEEDED FOR ITCHING (RASH). 30 tablet 0   Insulin Lispro Prot & Lispro (HUMALOG MIX 75/25 KWIKPEN) (75-25) 100 UNIT/ML Kwikpen Inject 8 units subcutaneously with breakfast and dinner. Increase to 10 units for 3-5 days twice a month after dexamethasone. 15 mL 11   Insulin Pen Needle (TECHLITE PEN NEEDLES) 32G X 4 MM MISC USE AS DIRECTED TWICE DAILY WITH INSULIN 100 each 1   ketoconazole (NIZORAL) 2 % cream Apply to skin of feet twice daily 60 g 2   Miconazole Nitrate (ATHLETES FOOT POWDER SPRAY) 2 % AERP Spray between toes and under toes once daily. 130 g 4   Multiple Vitamin (MULTIVITAMIN WITH MINERALS) TABS tablet Take 1 tablet by mouth daily. 60 tablet 2   ondansetron (ZOFRAN) 8 MG tablet Take 1 tablet (8 mg total) by mouth 2 (two) times daily as needed (Nausea or vomiting). 30 tablet 1   polyethylene glycol (MIRALAX) 17 g packet Take 17 g by mouth daily. 30 each 2    pomalidomide (POMALYST) 2 MG capsule Take 1 capsule (2 mg total) by mouth daily. 3 weeks on 1 week off 21 capsule 1   pravastatin (PRAVACHOL) 20 MG tablet Take 1 tablet (20 mg total) by mouth daily. 90 tablet 0   prochlorperazine (COMPAZINE) 10 MG tablet Take 1 tablet (10 mg total) by mouth every 6 (six) hours as needed (Nausea or vomiting). 30 tablet 1   tadalafil (CIALIS) 20 MG tablet Take 20 mg by mouth as needed.     tiZANidine (ZANAFLEX) 2 MG tablet Take 1 tablet (2 mg total) by mouth at bedtime as needed for muscle spasms. 30 tablet 1   valsartan (DIOVAN) 40 MG tablet Take 1 tablet (40 mg total) by mouth daily. STOP LISINOPRIL 90 tablet 3   vitamin B-12 (CYANOCOBALAMIN) 100 MCG tablet Take 100 mcg by mouth daily.     No current facility-administered medications for this visit.    REVIEW OF SYSTEMS:  10 Point review of Systems was done is negative except as noted above.   PHYSICAL EXAMINATION: .BP 130/61 (BP Location: Left Arm, Patient Position: Sitting)   Pulse 80   Temp (!) 97.5 F (36.4 C) (Temporal)   Resp 16   Ht 5\' 7"  (1.702 m)   Wt 175 lb 14.4 oz (79.8 kg)   SpO2 99%   BMI 27.55 kg/m   GENERAL:alert, in no acute distress and comfortable SKIN: no acute rashes, no significant lesions EYES: conjunctiva are pink and non-injected, sclera anicteric OROPHARYNX: MMM, no exudates, no oropharyngeal erythema or ulceration NECK: supple, no JVD LYMPH:  no palpable lymphadenopathy in the cervical, axillary or inguinal regions LUNGS: clear to auscultation b/l with normal respiratory effort HEART: regular rate & rhythm ABDOMEN:  normoactive bowel sounds , non tender, not distended. Extremity: no pedal edema PSYCH: alert & oriented x 3 with fluent speech NEURO: no focal motor/sensory deficits   LABORATORY DATA:  I have reviewed the data as listed  .    Latest Ref Rng & Units 10/25/2022   10:10 AM 10/11/2022  9:06 AM 09/27/2022    9:20 AM  CBC  WBC 4.0 - 10.5 K/uL 4.1   3.9  3.2   Hemoglobin 13.0 - 17.0 g/dL 40.9  81.1  91.4   Hematocrit 39.0 - 52.0 % 30.5  29.9  29.4   Platelets 150 - 400 K/uL 207  240  206       Latest Ref Rng & Units 10/25/2022   10:10 AM 10/11/2022    9:06 AM 09/27/2022    9:20 AM  CMP  Glucose 70 - 99 mg/dL 782  956  213   BUN 8 - 23 mg/dL Creatinine 0.61 - 1.24 mg/dL 0.86  5.78  4.69   Sodium 135 - 145 mmol/L 137  138  137   Potassium 3.5 - 5.1 mmol/L 4.1  4.0  3.9   Chloride 98 - 111 mmol/L 107  107  107   CO2 22 - 32 mmol/L Calcium 8.9 - 10.3 mg/dL 9.6  9.5  9.1   Total Protein 6.5 - 8.1 g/dL 6.6  6.5  6.0   Total Bilirubin 0.3 - 1.2 mg/dL 0.5  0.6  0.5   Alkaline Phos 38 - 126 U/L 80  94  77   AST 15 - 41 U/L ALT 0 - 44 U/L 08/29/17 BM Bx:    08/29/17 Cytogenetics:   03/13/17 Cytogenetics:        PATHOLOGY Surgical Pathology  CASE: [id number]  PATIENT: Robert Sparks  Bone Marrow Report      Clinical History: Multiple myeloma in relapse (HCC) ,(BH)      DIAGNOSIS:   BONE MARROW, ASPIRATE, CLOT, CORE:  -Hypercellular bone marrow for age with plasma cell neoplasm  -See comment   PERIPHERAL BLOOD:  -Mild normocytic-normochromic anemia  -Eosinophilia   COMMENT:   The bone marrow is hypercellular for age with increased number of plasma  cells representing 8% of all cells in the aspirate associated with  numerous small clusters in the clot and biopsy sections. The plasma  cells display lambda light chain restriction consistent with plasma cell  neoplasm.  The background shows trilineage hematopoiesis with generally  nonspecific myeloid changes, likely secondary in nature in this setting.  Correlation with cytogenetic and FISH studies is recommended.      RADIOGRAPHIC STUDIES: I have personally reviewed the radiological images as listed and agreed with the findings in the report. MR Lumbar Spine w/o contrast  Result Date:  10/22/2022 CLINICAL DATA:  Low back pain radiating to the left hip. EXAM: MRI LUMBAR SPINE WITHOUT CONTRAST TECHNIQUE: Multiplanar, multisequence MR imaging of the lumbar spine was performed. No intravenous contrast was administered. COMPARISON:  01/06/2019 FINDINGS: Segmentation:  Standard. Alignment:  Physiologic. Vertebrae: No acute fracture, evidence of discitis, or aggressive bone lesion. Conus medullaris and cauda equina: Conus extends to the L2 level. Conus and cauda equina appear normal. Paraspinal and other soft tissues: No acute paraspinal abnormality. Disc levels: Disc spaces: Degenerative disease with disc height loss at L3-4. Disc T12-L1: No significant disc bulge. No neural foraminal stenosis. No central canal stenosis. L1-L2: No significant disc bulge. Mild bilateral facet arthropathy. No foraminal or central canal stenosis. L2-L3: Mild broad-based disc bulge. Mild bilateral facet arthropathy with bilateral facet effusions. No foraminal or central canal stenosis. L3-L4: Broad-based disc bulge. Mild bilateral facet arthropathy. Bilateral lateral recess  stenosis. No spinal stenosis. Mild left foraminal stenosis. No right foraminal stenosis. L4-L5: Broad-based disc bulge. Moderate bilateral facet arthropathy with a left facet effusion and bilateral ligamentum flavum infolding 10 x 8 x 18 mm cyst along the dorsal midline of the thecal sac between the ligamentum flavum with mass effect on the thecal sac. Mild resultant spinal stenosis. Mild bilateral foraminal stenosis. L5-S1: Minimal broad-based disc bulge. Mild bilateral facet arthropathy with bilateral small facet effusions. Mild bilateral foraminal stenosis. No spinal stenosis. IMPRESSION: 1. At L4-5 there is a broad-based disc bulge. Moderate bilateral facet arthropathy with a left facet effusion and bilateral ligamentum flavum infolding 10 x 8 x 18 mm cyst along the dorsal midline of the thecal sac between the ligamentum flavum with mass effect on  the thecal sac. Mild resultant spinal stenosis. Mild bilateral foraminal stenosis. 2. At L3-4 there is a broad-based disc bulge. Mild bilateral facet arthropathy. Bilateral lateral recess stenosis. Mild left foraminal stenosis. 3. At L5-S1 there is a minimal broad-based disc bulge. Mild bilateral facet arthropathy with bilateral small facet effusions. Mild bilateral foraminal stenosis. 4. No acute osseous injury of the lumbar spine. Electronically Signed   By: Elige Ko M.D.   On: 10/22/2022 07:38    ASSESSMENT & PLAN:   77 y.o. male with   1.  Relapsed IgA Lambda Multiple Myeloma ISS Stage I    Active disease was previously diagnosed based on presence of anemia, kidney insufficiency, and paraproteinemia with significant predominance of lambda light chains as well as significant elevation of IgA. Patient is status post patient was treated with induction bortezomib Revlimid dexamethasone.  Had issues with skin rashes with Revlimid and this was discontinued.  Completed bortezomib dexamethasone induction and achieved complete remission. Was on maintenance Velcade 2 weeks which was then switched to maintenance Ninlaro. Patient had issues with worsening neuropathy which was a combination of Velcade and his diabetes.  Ninlaro was discontinued as well after maintenance of more than 2 years.  2.  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 Bone marrow biopsy shows 8% lambda restricted plasma cells PET CT scan with no hypermetabolic osseous lesions  3.  Hypertension 4.  Diabetes type 2 5 .chronic kidney disease due to hypertension and diabetes. 6.  Peripheral neuropathy related to diabetes and previous myeloma treatments.  PLAN:  -Discussed lab results on 10/25/2022 with patient in detail. CBC showed WBC of 4.1K, hemoglobin of 10.2, and platelets of 207K. -CBC reveals stable mild anemia -CMP stable with stable  chronic kidney disease -09/27/2022 myeloma lab showed that patient continues to be in remission -myeloma lab today pending -Discussed option of physical therapy to improve back pain -no sign of cancer in the back based on 10/19/2022 MRI results -answered patient's questions regarding treatment -Recommended patient to regularly stay active and continue to drink plenty of water daily -Patient is tolerating his treatment well without any toxicities.   -No new focal symptoms related to multiple myeloma. -We will continue treatment without any dose modifications.  -No toxicities with Daratumumab and Pomalidomide.  FOLLOW-UP: Per integrated scheduling  The total time spent in the appointment was 30 minutes* .  All of the patient's questions were answered with apparent satisfaction. The patient knows to call the clinic with any problems, questions or concerns.   Wyvonnia Lora MD MS AAHIVMS [facility] Hematology/Oncology Physician [facility]  .*Total Encounter Time as defined by the  Centers for Medicare and Medicaid Services includes, in addition to the face-to-face time of a patient visit (documented in the note above) non-face-to-face time: obtaining and reviewing outside history, ordering and reviewing medications, tests or procedures, care coordination (communications with other health care professionals or caregivers) and documentation in the medical record.    I,Mitra Faeizi,acting as a Neurosurgeon for Wyvonnia Lora, MD.,have documented all relevant documentation on the behalf of Wyvonnia Lora, MD,as directed by  Wyvonnia Lora, MD while in the presence of Wyvonnia Lora, MD.  .I have reviewed the above documentation for accuracy and completeness, and I agree with the above. Johney Maine MD

## 2022-10-25 NOTE — Progress Notes (Signed)
Patient seen by Dr. Addison Naegeli are within treatment parameters.  Labs reviewed: and are not all within treatment parameters. Dr Candise Che aware CR: 1.54  Per physician team, patient is ready for treatment and there are NO modifications to the treatment plan.

## 2022-10-25 NOTE — Patient Instructions (Signed)
[facility]  Discharge Instructions: Thank you for choosing [facility] to provide your oncology and hematology care.   If you have a lab appointment with the Cancer Center, please go directly to the Cancer Center and check in at the registration area.   Wear comfortable clothing and clothing appropriate for easy access to any Portacath or PICC line.   We strive to give you quality time with your provider. You may need to reschedule your appointment if you arrive late (15 or more minutes).  Arriving late affects you and other patients whose appointments are after yours.  Also, if you miss three or more appointments without notifying the office, you may be dismissed from the clinic at the provider's discretion.      For prescription refill requests, have your pharmacy contact our office and allow 72 hours for refills to be completed.    Today you received the following chemotherapy and/or immunotherapy agents darzalex      To help prevent nausea and vomiting after your treatment, we encourage you to take your nausea medication as directed.  BELOW ARE SYMPTOMS THAT SHOULD BE REPORTED IMMEDIATELY: *FEVER GREATER THAN 100.4 F (38 C) OR HIGHER *CHILLS OR SWEATING *NAUSEA AND VOMITING THAT IS NOT CONTROLLED WITH YOUR NAUSEA MEDICATION *UNUSUAL SHORTNESS OF BREATH *UNUSUAL BRUISING OR BLEEDING *URINARY PROBLEMS (pain or burning when urinating, or frequent urination) *BOWEL PROBLEMS (unusual diarrhea, constipation, pain near the anus) TENDERNESS IN MOUTH AND THROAT WITH OR WITHOUT PRESENCE OF ULCERS (sore throat, sores in mouth, or a toothache) UNUSUAL RASH, SWELLING OR PAIN  UNUSUAL VAGINAL DISCHARGE OR ITCHING   Items with * indicate a potential emergency and should be followed up as soon as possible or go to the Emergency Department if any problems should occur.  Please show the CHEMOTHERAPY ALERT CARD or IMMUNOTHERAPY ALERT CARD at  check-in to the Emergency Department and triage nurse.  Should you have questions after your visit or need to cancel or reschedule your appointment, please contact [facility]  Dept: [phone]  and follow the prompts.  Office hours are 8:00 a.m. to 4:30 p.m. Monday - Friday. Please note that voicemails left after 4:00 p.m. may not be returned until the following business day.  We are closed weekends and major holidays. You have access to a nurse at all times for urgent questions. Please call the main number to the clinic Dept: [phone] and follow the prompts.   For any non-urgent questions, you may also contact your provider using MyChart. We now offer e-Visits for anyone 18 and older to request care online for non-urgent symptoms. For details visit [email or web address].   Also download the MyChart app! Go to the app store, search "MyChart", open the app, select [facility], and log in with your MyChart username and password.   

## 2022-10-28 ENCOUNTER — Other Ambulatory Visit: Payer: 59

## 2022-10-29 LAB — MULTIPLE MYELOMA PANEL, SERUM
Albumin SerPl Elph-Mcnc: 3.5 g/dL (ref 2.9–4.4)
Albumin/Glob SerPl: 1.5 (ref 0.7–1.7)
Alpha 1: 0.2 g/dL (ref 0.0–0.4)
Alpha2 Glob SerPl Elph-Mcnc: 0.8 g/dL (ref 0.4–1.0)
B-Globulin SerPl Elph-Mcnc: 0.9 g/dL (ref 0.7–1.3)
Gamma Glob SerPl Elph-Mcnc: 0.4 g/dL (ref 0.4–1.8)
Globulin, Total: 2.4 g/dL (ref 2.2–3.9)
IgA: 104 mg/dL (ref 61–437)
IgG (Immunoglobin G), Serum: 528 mg/dL — ABNORMAL LOW (ref 603–1613)
IgM (Immunoglobulin M), Srm: 36 mg/dL (ref 15–143)
M Protein SerPl Elph-Mcnc: 0.1 g/dL — ABNORMAL HIGH
Total Protein ELP: 5.9 g/dL — ABNORMAL LOW (ref 6.0–8.5)

## 2022-10-30 ENCOUNTER — Other Ambulatory Visit: Payer: Self-pay

## 2022-10-31 ENCOUNTER — Other Ambulatory Visit: Payer: Self-pay | Admitting: Internal Medicine

## 2022-10-31 ENCOUNTER — Other Ambulatory Visit: Payer: Self-pay

## 2022-10-31 ENCOUNTER — Encounter: Payer: Self-pay | Admitting: Hematology

## 2022-10-31 MED ORDER — TECHLITE PEN NEEDLES 32G X 4 MM MISC
0 refills | Status: DC
  Filled 2022-10-31: qty 100, 50d supply, fill #0

## 2022-10-31 NOTE — Telephone Encounter (Signed)
Requested Prescriptions  Pending Prescriptions Disp Refills   Insulin Pen Needle (TECHLITE PEN NEEDLES) 32G X 4 MM MISC 100 each 0    Sig: USE AS DIRECTED TWICE DAILY WITH INSULIN     Endocrinology: Diabetes - Testing Supplies Passed - 10/31/2022 10:27 AM      Passed - Valid encounter within last 12 months    Recent Outpatient Visits           1 month ago Type 2 diabetes mellitus with peripheral neuropathy [facility])   [place] [facility] Jonah Blue B, MD   4 months ago Type 2 diabetes mellitus with peripheral neuropathy [facility])   [place] [facility] [place], [place] L, RPH-CPP   5 months ago Type 2 diabetes mellitus with peripheral neuropathy [facility])   [place] [facility] Jonah Blue B, MD   7 months ago Type 2 diabetes mellitus with peripheral neuropathy [facility])   [place] [facility] [place], [place] L, RPH-CPP   8 months ago Type 2 diabetes mellitus with peripheral neuropathy [facility])   [facility] Drucilla Chalet, RPH-CPP       Future Appointments             In 1 week London Sheer, MD [facility]   In 2 months Marcine Matar, MD [facility]

## 2022-11-01 ENCOUNTER — Other Ambulatory Visit: Payer: Self-pay

## 2022-11-02 ENCOUNTER — Other Ambulatory Visit: Payer: Self-pay

## 2022-11-03 ENCOUNTER — Ambulatory Visit: Payer: 59 | Attending: Internal Medicine

## 2022-11-03 DIAGNOSIS — Z0000 Encounter for general adult medical examination without abnormal findings: Secondary | ICD-10-CM | POA: Diagnosis not present

## 2022-11-03 NOTE — Progress Notes (Signed)
Subjective:   Davaun Quintela Castner is a 77 y.o. male who presents for Medicare Annual/Subsequent preventive examination.  Review of Systems    connected with  Mr.Bencivenga on 11/03/22   at  0959 am by telephone and verified that I am speaking with the correct person using two identifiers. I discussed the limitations, risks, security and privacy concerns of performing an evaluation and management service by telephone and the availability of in person appointments. I also discussed with the patient that there may be a patient responsible charge related to this service. The patient expressed understanding and agreed to proceed.  Patient location:  Home  My Location: community health and wellness  Persons on the telephone call:   Myself (Shevette Bess )and Mr.Regino        Objective:    There were no vitals filed for this visit. There is no height or weight on file to calculate BMI.     11/03/2022   10:08 AM 03/10/2022   12:50 PM 11/17/2021   11:00 AM 10/10/2021    9:59 AM 10/06/2021   11:13 AM 08/19/2021    2:17 PM 07/21/2021   11:50 AM  Advanced Directives  Does Patient Have a Medical Advance Directive? No No No Yes No No No  Type of Advance Directive    Healthcare Power of Attorney     Does patient want to make changes to medical advance directive?    No - Patient declined     Copy of Healthcare Power of Attorney in Chart?    No - copy requested     Would patient like information on creating a medical advance directive? No - Patient declined No - Patient declined No - Patient declined No - Patient declined No - Patient declined No - Patient declined No - Patient declined    Current Medications (verified) Outpatient Encounter Medications as of 11/03/2022  Medication Sig   Accu-Chek Softclix Lancets lancets Use to test blood sugar 3 times daily.   acyclovir (ZOVIRAX) 400 MG tablet Take 1 tablet (400 mg total) by mouth 2 (two) times daily.   amLODipine (NORVASC) 10 MG tablet TAKE 1 TABLET (10 MG  TOTAL) BY MOUTH DAILY.   aspirin EC 81 MG tablet Take 81 mg by mouth daily. Swallow whole.   b complex vitamins capsule Take 1 capsule by mouth daily.   Blood Glucose Monitoring Suppl (ACCU-CHEK GUIDE) w/Device KIT Use to test blood sugar three times daily   Continuous Blood Gluc Receiver (FREESTYLE LIBRE READER) DEVI Use to check blood sugar 3x daily. E11.42   Continuous Blood Gluc Sensor (FREESTYLE LIBRE SENSOR SYSTEM) MISC Use to check blood sugar 3 times daily. Change sensor every 2 weeks.   dexamethasone (DECADRON) 4 MG tablet 20mg  (5 tabs) Take the day after each dose of daratumumab. Take with breakfast   diclofenac Sodium (VOLTAREN) 1 % GEL Apply 4 g topically 4 (four) times daily.   ferrous sulfate 325 (65 FE) MG tablet Take 325 mg by mouth daily.   furosemide (LASIX) 20 MG tablet Take 1 tablet (20 mg total) by mouth daily in the morning for 7 days (until 09/08/22). Save remaining tablets.   gabapentin (NEURONTIN) 300 MG capsule TAKE 1 CAPSULE BY MOUTH IN THE MORNING AND LUNCH AND 2 AT BEDTIME   glucose blood (ACCU-CHEK GUIDE) test strip Use to test blood sugar 3 times daily.   hydrOXYzine (ATARAX) 25 MG tablet TAKE 1 TABLET (25 MG TOTAL) BY MOUTH 3 (THREE) TIMES DAILY AS  NEEDED FOR ITCHING (RASH).   Insulin Lispro Prot & Lispro (HUMALOG MIX 75/25 KWIKPEN) (75-25) 100 UNIT/ML Kwikpen Inject 8 units subcutaneously with breakfast and dinner. Increase to 10 units for 3-5 days twice a month after dexamethasone.   Insulin Pen Needle (TECHLITE PEN NEEDLES) 32G X 4 MM MISC USE AS DIRECTED TWICE DAILY WITH INSULIN   Multiple Vitamin (MULTIVITAMIN WITH MINERALS) TABS tablet Take 1 tablet by mouth daily.   polyethylene glycol (MIRALAX) 17 g packet Take 17 g by mouth daily.   pravastatin (PRAVACHOL) 20 MG tablet Take 1 tablet (20 mg total) by mouth daily.   valsartan (DIOVAN) 40 MG tablet Take 1 tablet (40 mg total) by mouth daily. STOP LISINOPRIL   vitamin B-12 (CYANOCOBALAMIN) 100 MCG tablet Take 100  mcg by mouth daily.   donepezil (ARICEPT) 5 MG tablet TAKE 1 TABLET (5 MG TOTAL) BY MOUTH AT BEDTIME. FOR MEMORY ISSUES   ketoconazole (NIZORAL) 2 % cream Apply to skin of feet twice daily   Miconazole Nitrate (ATHLETES FOOT POWDER SPRAY) 2 % AERP Spray between toes and under toes once daily. (Patient not taking: Reported on 11/03/2022)   ondansetron (ZOFRAN) 8 MG tablet Take 1 tablet (8 mg total) by mouth 2 (two) times daily as needed (Nausea or vomiting). (Patient not taking: Reported on 11/03/2022)   pomalidomide (POMALYST) 2 MG capsule Take 1 capsule (2 mg total) by mouth daily. 3 weeks on 1 week off   prochlorperazine (COMPAZINE) 10 MG tablet Take 1 tablet (10 mg total) by mouth every 6 (six) hours as needed (Nausea or vomiting). (Patient not taking: Reported on 11/03/2022)   tadalafil (CIALIS) 20 MG tablet Take 20 mg by mouth as needed.   tiZANidine (ZANAFLEX) 2 MG tablet Take 1 tablet (2 mg total) by mouth at bedtime as needed for muscle spasms. (Patient not taking: Reported on 11/03/2022)   No facility-administered encounter medications on file as of 11/03/2022.    Allergies (verified) Other and Lisinopril   History: Past Medical History:  Diagnosis Date   Anemia    Arthritis    shoulders,feet    Cataract    per eye dr -has appt 5-11   CKD (chronic kidney disease), stage III (HCC)    Elevated PSA    History of ketoacidosis    03-29-2015    History of sepsis    07-25-2013  non-compliant w/ medication   Hyperlipidemia    Hypertension    Nocturia    Peripheral neuropathy    Prostate cancer (HCC)    Type 2 diabetes mellitus with insulin therapy [facility])    Past Surgical History:  Procedure Laterality Date   CIRCUMCISION  1990's   PROSTATE BIOPSY N/A 12/21/2015   Procedure: BIOPSY TRANSRECTAL ULTRASONIC PROSTATE (TUBP);  Surgeon: Jerilee Field, MD;  Location: [facility];  Service: Urology;  Laterality: N/A;   Family History  Problem Relation Age of Onset    Kidney disease Mother    Cancer Neg Hx    Colon cancer Neg Hx    Colon polyps Neg Hx    Esophageal cancer Neg Hx    Rectal cancer Neg Hx    Stomach cancer Neg Hx    Social History   Socioeconomic History   Marital status: Single    Spouse name: Not on file   Number of children: Not on file   Years of education: Not on file   Highest education level: Not on file  Occupational History   Not on file  Tobacco Use   Smoking status: Every Day    Packs/day: 0.50    Years: 50.00    Additional pack years: 0.00    Total pack years: 25.00    Types: Cigarettes   Smokeless tobacco: Never   Tobacco comments:    1 ppwk-4-5 a day   Vaping Use   Vaping Use: Never used  Substance and Sexual Activity   Alcohol use: Yes    Alcohol/week: 1.0 standard drink of alcohol    Types: 1 Cans of beer per week    Comment: 1 every day-quit couple weeks ago   Drug use: No   Sexual activity: Not Currently  Other Topics Concern   Not on file  Social History Narrative   Not on file   Social Determinants of Health   Financial Resource Strain: Low Risk  (11/03/2022)   Overall Financial Resource Strain (CARDIA)    Difficulty of Paying Living Expenses: Not hard at all  Food Insecurity: No Food Insecurity (09/15/2022)   Hunger Vital Sign    Worried About Running Out of Food in the Last Year: Never true    Ran Out of Food in the Last Year: Never true  Transportation Needs: No Transportation Needs (09/15/2022)   PRAPARE - Administrator, Civil Service (Medical): No    Lack of Transportation (Non-Medical): No  Physical Activity: Insufficiently Active (11/03/2022)   Exercise Vital Sign    Days of Exercise per Week: 1 day    Minutes of Exercise per Session: 20 min  Stress: No Stress Concern Present (11/03/2022)   [employer] of Occupational Health - Occupational Stress Questionnaire    Feeling of Stress : Not at all  Social Connections: Socially Isolated (11/03/2022)   Social Connection and  Isolation Panel [NHANES]    Frequency of Communication with Friends and Family: Once a week    Frequency of Social Gatherings with Friends and Family: Once a week    Attends Religious Services: 1 to 4 times per year    Active Member of [employer] or Organizations: No    Attends Engineer, structural: Never    Marital Status: Never married    Tobacco Counseling Ready to quit: Not Answered Counseling given: Not Answered Tobacco comments: 1 ppwk-4-5 a day    Clinical Intake:     Pain : No/denies pain     Diabetes: Yes     Diabetic?Nutrition Risk Assessment:  Has the patient had any N/V/D within the last 2 months?     Does the patient have any non-healing wounds?  No  Has the patient had any unintentional weight loss or weight gain?  No   Diabetes:  Is the patient diabetic?  Yes  If diabetic, was a CBG obtained today?  No  Did the patient bring in their glucometer from home?  No  How often do you monitor your CBG's? 3-4 times a day .   Financial Strains and Diabetes Management:  Are you having any financial strains with the device, your supplies or your medication? No .  Does the patient want to be seen by Chronic Care Management for management of their diabetes?  No  Would the patient like to be referred to a Nutritionist or for Diabetic Management?  No   Diabetic Exams:  Diabetic Eye Exam: Completed 01/31/22 Diabetic Foot Exam: Completed 09/07/22           Activities of Daily Living    11/03/2022   10:08 AM  In your present state of health, do you have any difficulty performing the following activities:  Hearing? 0  Vision? 0  Difficulty concentrating or making decisions? 1  Walking or climbing stairs? 1  Dressing or bathing? 0  Doing errands, shopping? 0  Preparing Food and eating ? N  Using the Toilet? N  In the past six months, have you accidently leaked urine? N  Do you have problems with loss of bowel control? N  Managing your Medications? N   Managing your Finances? N  Housekeeping or managing your Housekeeping? N    Patient Care Team: Marcine Matar, MD as PCP - General (Internal Medicine)  Indicate any recent Medical Services you may have received from other than [facility] providers in the past year (date may be approximate).     Assessment:   This is a routine wellness examination for [facility].  Hearing/Vision screen No results found.  Dietary issues and exercise activities discussed:     Goals Addressed   None   Depression Screen    09/07/2022    9:16 AM 05/08/2022    9:20 AM 01/02/2022   11:06 AM 10/10/2021   10:01 AM 08/29/2021   10:41 AM 01/04/2021    9:00 AM 05/04/2020    5:18 PM  PHQ 2/9 Scores  PHQ - 2 Score 0 0 0 0 3 1 0  PHQ- 9 Score 1  3  4       Fall Risk    11/03/2022   10:08 AM 09/07/2022    9:05 AM 05/08/2022    9:20 AM 10/10/2021   10:01 AM 08/29/2021   10:41 AM  Fall Risk   Falls in the past year? 0 1 0 0 0  Number falls in past yr: 0 0 0 0 0  Injury with Fall? 0 0 0 0 0  Risk for fall due to : No Fall Risks No Fall Risks No Fall Risks No Fall Risks No Fall Risks  Follow up    Falls evaluation completed;Education provided;Falls prevention discussed     FALL RISK PREVENTION PERTAINING TO THE HOME:  Any stairs in or around the home? Yes  If so, are there any without handrails? Yes  Home free of loose throw rugs in walkways, pet beds, electrical cords, etc? No  Adequate lighting in your home to reduce risk of falls? Yes   ASSISTIVE DEVICES UTILIZED TO PREVENT FALLS:  Life alert? No  Use of a cane, walker or w/c? Yes  Grab bars in the bathroom? Yes  Shower chair or bench in shower? Yes  Elevated toilet seat or a handicapped toilet? No   TIMED UP AND GO:  Was the test performed? No .  Length of time to ambulate 10 feet:  sec.   Gait slow and steady with assistive device  Cognitive Function:    11/03/2022   10:10 AM 10/10/2021   10:04 AM 05/25/2020    9:08 AM 04/25/2019   11:35  AM  MMSE - Mini Mental State Exam  Orientation to time 5 4 5 5   Orientation to Place 5 5 5 5   Registration 3 3 2 3   Attention/ Calculation 5 3 0 0  Recall 3 2 3  0  Language- name 2 objects 2 2 2 2   Language- repeat 1 1 1 1   Language- follow 3 step command 3 3 3 3   Language- read & follow direction 1 1 1 1   Write a sentence 1 0 1 1  Copy design  1 1 1 1   Total score 30 25 24 22         11/03/2022   10:11 AM  6CIT Screen  What Year? 0 points  What month? 0 points  What time? 0 points  Count back from 20 0 points  Months in reverse 0 points  Repeat phrase 0 points  Total Score 0 points    Immunizations Immunization History  Administered Date(s) Administered   Fluad Quad(high Dose 65+) 04/21/2022   Influenza, High Dose Seasonal PF 04/08/2018, 04/17/2019   Influenza,inj,Quad PF,6+ Mos 02/23/2016   Influenza-Unspecified 04/28/2020   PFIZER(Purple Top)SARS-COV-2 Vaccination 09/05/2019, 10/01/2019   Pfizer Covid-19 Vaccine Bivalent Booster 74yrs & up 04/14/2020, 10/22/2020, 04/21/2022   Pneumococcal Conjugate-13 03/27/2018, 06/20/2018   Pneumococcal Polysaccharide-23 02/23/2016   Tdap 06/21/2016   Zoster Recombinat (Shingrix) 09/07/2022    TDAP status: Up to date  Flu Vaccine status: Up to date  Pneumococcal vaccine status: Up to date  Covid-19 vaccine status: Information provided on how to obtain vaccines.   Qualifies for Shingles Vaccine? Yes   Zostavax completed No   Shingrix Completed?: No.    Education has been provided regarding the importance of this vaccine. Patient has been advised to call insurance company to determine out of pocket expense if they have not yet received this vaccine. Advised may also receive vaccine at local pharmacy or Health Dept. Verbalized acceptance and understanding.  Screening Tests Health Maintenance  Topic Date Due   Lung Cancer Screening  Never done   COVID-19 Vaccine (6 - 2023-24 season) 06/16/2022   Zoster Vaccines- Shingrix (2  of 2) 11/02/2022   OPHTHALMOLOGY EXAM  02/01/2023   INFLUENZA VACCINE  02/08/2023   HEMOGLOBIN A1C  03/08/2023   Diabetic kidney evaluation - Urine ACR  09/07/2023   FOOT EXAM  09/07/2023   Diabetic kidney evaluation - eGFR measurement  10/25/2023   Medicare Annual Wellness (AWV)  11/03/2023   DTaP/Tdap/Td (2 - Td or Tdap) 06/21/2026   COLONOSCOPY (Pts 45-80yrs Insurance coverage will need to be confirmed)  11/29/2026   Pneumonia Vaccine 73+ Years old  Completed   Hepatitis C Screening  Completed   HPV VACCINES  Aged Out    Health Maintenance  Health Maintenance Due  Topic Date Due   Lung Cancer Screening  Never done   COVID-19 Vaccine (6 - 2023-24 season) 06/16/2022   Zoster Vaccines- Shingrix (2 of 2) 11/02/2022    Colorectal cancer screening: Type of screening: Colonoscopy. Completed 11/28/16. Repeat every 10 years  Lung Cancer Screening: (Low Dose CT Chest recommended if Age 42-80 years, 30 pack-year currently smoking OR have quit w/in 15years.) does qualify.   Lung Cancer Screening Referral: Patient declined scan   Additional Screening:  Hepatitis C Screening: does qualify; Completed 06/21/16  Vision Screening: Recommended annual ophthalmology exams for early detection of glaucoma and other disorders of the eye. Is the patient up to date with their annual eye exam?  Yes  Who is the provider or what is the name of the office in which the patient attends annual eye exams? [facility]  If pt is not established with a provider, would they like to be referred to a provider to establish care? No .   Dental Screening: Recommended annual dental exams for proper oral hygiene  Community Resource Referral / Chronic Care Management: CRR required this visit?  No   CCM required this visit?  No      Plan:     I have  personally reviewed and noted the following in the patient's chart:   Medical and social history Use of alcohol, tobacco or illicit drugs   Current medications and supplements including opioid prescriptions. Patient is not currently taking opioid prescriptions. Functional ability and status Nutritional status Physical activity Advanced directives List of other physicians Hospitalizations, surgeries, and ER visits in previous 12 months Vitals Screenings to include cognitive, depression, and falls Referrals and appointments  In addition, I have reviewed and discussed with patient certain preventive protocols, quality metrics, and best practice recommendations. A written personalized care plan for preventive services as well as general preventive health recommendations were provided to patient.     Philippa Chester, CMA   11/03/2022   Nurse Notes:

## 2022-11-03 NOTE — Patient Instructions (Signed)
Follow up with Dr.Johnson as planned.

## 2022-11-06 ENCOUNTER — Other Ambulatory Visit: Payer: Self-pay | Admitting: Internal Medicine

## 2022-11-06 ENCOUNTER — Other Ambulatory Visit: Payer: Self-pay

## 2022-11-06 DIAGNOSIS — E1169 Type 2 diabetes mellitus with other specified complication: Secondary | ICD-10-CM

## 2022-11-06 MED ORDER — PRAVASTATIN SODIUM 20 MG PO TABS
20.0000 mg | ORAL_TABLET | Freq: Every day | ORAL | 0 refills | Status: DC
Start: 2022-11-06 — End: 2023-01-09
  Filled 2022-11-06: qty 90, 90d supply, fill #0

## 2022-11-07 ENCOUNTER — Other Ambulatory Visit: Payer: Self-pay

## 2022-11-07 MED FILL — Dexamethasone Sodium Phosphate Inj 100 MG/10ML: INTRAMUSCULAR | Qty: 2 | Status: AC

## 2022-11-08 ENCOUNTER — Inpatient Hospital Stay: Payer: 59 | Admitting: Hematology

## 2022-11-08 ENCOUNTER — Inpatient Hospital Stay: Payer: 59

## 2022-11-08 ENCOUNTER — Other Ambulatory Visit: Payer: Self-pay

## 2022-11-08 ENCOUNTER — Inpatient Hospital Stay: Payer: 59 | Attending: Hematology

## 2022-11-08 VITALS — BP 129/61 | HR 76 | Temp 98.0°F | Resp 18 | Wt 174.8 lb

## 2022-11-08 DIAGNOSIS — Z7189 Other specified counseling: Secondary | ICD-10-CM

## 2022-11-08 DIAGNOSIS — C9002 Multiple myeloma in relapse: Secondary | ICD-10-CM | POA: Diagnosis not present

## 2022-11-08 DIAGNOSIS — Z5112 Encounter for antineoplastic immunotherapy: Secondary | ICD-10-CM | POA: Insufficient documentation

## 2022-11-08 LAB — COMPREHENSIVE METABOLIC PANEL
ALT: 27 U/L (ref 0–44)
AST: 21 U/L (ref 15–41)
Albumin: 4 g/dL (ref 3.5–5.0)
Alkaline Phosphatase: 62 U/L (ref 38–126)
Anion gap: 6 (ref 5–15)
BUN: 17 mg/dL (ref 8–23)
CO2: 23 mmol/L (ref 22–32)
Calcium: 9.2 mg/dL (ref 8.9–10.3)
Chloride: 108 mmol/L (ref 98–111)
Creatinine, Ser: 1.4 mg/dL — ABNORMAL HIGH (ref 0.61–1.24)
GFR, Estimated: 53 mL/min — ABNORMAL LOW (ref >60)
Glucose, Bld: 148 mg/dL — ABNORMAL HIGH (ref 70–99)
Potassium: 3.9 mmol/L (ref 3.5–5.1)
Sodium: 137 mmol/L (ref 135–145)
Total Bilirubin: 0.5 mg/dL (ref 0.3–1.2)
Total Protein: 6.4 g/dL — ABNORMAL LOW (ref 6.5–8.1)

## 2022-11-08 LAB — CBC WITH DIFFERENTIAL (CANCER CENTER ONLY)
Abs Immature Granulocytes: 0.05 10*3/uL (ref 0.00–0.07)
Basophils Absolute: 0.1 10*3/uL (ref 0.0–0.1)
Basophils Relative: 2 %
Eosinophils Absolute: 0.2 10*3/uL (ref 0.0–0.5)
Eosinophils Relative: 4 %
HCT: 29.7 % — ABNORMAL LOW (ref 39.0–52.0)
Hemoglobin: 10.1 g/dL — ABNORMAL LOW (ref 13.0–17.0)
Immature Granulocytes: 1 %
Lymphocytes Relative: 27 %
Lymphs Abs: 1.2 10*3/uL (ref 0.7–4.0)
MCH: 34 pg (ref 26.0–34.0)
MCHC: 34 g/dL (ref 30.0–36.0)
MCV: 100 fL (ref 80.0–100.0)
Monocytes Absolute: 0.5 10*3/uL (ref 0.1–1.0)
Monocytes Relative: 12 %
Neutro Abs: 2.4 10*3/uL (ref 1.7–7.7)
Neutrophils Relative %: 54 %
Platelet Count: 284 10*3/uL (ref 150–400)
RBC: 2.97 MIL/uL — ABNORMAL LOW (ref 4.22–5.81)
RDW: 15 % (ref 11.5–15.5)
WBC Count: 4.5 10*3/uL (ref 4.0–10.5)
nRBC: 0 % (ref 0.0–0.2)

## 2022-11-08 MED ORDER — ACETAMINOPHEN 325 MG PO TABS
650.0000 mg | ORAL_TABLET | Freq: Once | ORAL | Status: AC
  Administered 2022-11-08: 650 mg via ORAL
  Filled 2022-11-08: qty 2

## 2022-11-08 MED ORDER — DIPHENHYDRAMINE HCL 25 MG PO CAPS
50.0000 mg | ORAL_CAPSULE | Freq: Once | ORAL | Status: AC
  Administered 2022-11-08: 50 mg via ORAL
  Filled 2022-11-08: qty 2

## 2022-11-08 MED ORDER — FAMOTIDINE 20 MG IN NS 100 ML IVPB
20.0000 mg | Freq: Once | INTRAVENOUS | Status: AC
  Administered 2022-11-08: 20 mg via INTRAVENOUS
  Filled 2022-11-08: qty 100

## 2022-11-08 MED ORDER — SODIUM CHLORIDE 0.9 % IV SOLN
16.0000 mg/kg | Freq: Once | INTRAVENOUS | Status: AC
  Administered 2022-11-08: 1200 mg via INTRAVENOUS
  Filled 2022-11-08: qty 60

## 2022-11-08 MED ORDER — SODIUM CHLORIDE 0.9 % IV SOLN: Freq: Once | INTRAVENOUS | Status: AC

## 2022-11-08 NOTE — Patient Instructions (Signed)
[facility] [facility]  Discharge Instructions: Thank you for choosing [facility] to provide your oncology and hematology care.   If you have a lab appointment with the Cancer Center, please go directly to the Cancer Center and check in at the registration area.   Wear comfortable clothing and clothing appropriate for easy access to any Portacath or PICC line.   We strive to give you quality time with your provider. You may need to reschedule your appointment if you arrive late (15 or more minutes).  Arriving late affects you and other patients whose appointments are after yours.  Also, if you miss three or more appointments without notifying the office, you may be dismissed from the clinic at the provider's discretion.      For prescription refill requests, have your pharmacy contact our office and allow 72 hours for refills to be completed.    Today you received the following chemotherapy and/or immunotherapy agents: Daratumumab.       To help prevent nausea and vomiting after your treatment, we encourage you to take your nausea medication as directed.  BELOW ARE SYMPTOMS THAT SHOULD BE REPORTED IMMEDIATELY: *FEVER GREATER THAN 100.4 F (38 C) OR HIGHER *CHILLS OR SWEATING *NAUSEA AND VOMITING THAT IS NOT CONTROLLED WITH YOUR NAUSEA MEDICATION *UNUSUAL SHORTNESS OF BREATH *UNUSUAL BRUISING OR BLEEDING *URINARY PROBLEMS (pain or burning when urinating, or frequent urination) *BOWEL PROBLEMS (unusual diarrhea, constipation, pain near the anus) TENDERNESS IN MOUTH AND THROAT WITH OR WITHOUT PRESENCE OF ULCERS (sore throat, sores in mouth, or a toothache) UNUSUAL RASH, SWELLING OR PAIN  UNUSUAL VAGINAL DISCHARGE OR ITCHING   Items with * indicate a potential emergency and should be followed up as soon as possible or go to the Emergency Department if any problems should occur.  Please show the CHEMOTHERAPY ALERT CARD or IMMUNOTHERAPY ALERT CARD at  check-in to the Emergency Department and triage nurse.  Should you have questions after your visit or need to cancel or reschedule your appointment, please contact [place] [facility]  Dept: [phone]  and follow the prompts.  Office hours are 8:00 a.m. to 4:30 p.m. Monday - Friday. Please note that voicemails left after 4:00 p.m. may not be returned until the following business day.  We are closed weekends and major holidays. You have access to a nurse at all times for urgent questions. Please call the main number to the clinic Dept: [phone] and follow the prompts.   For any non-urgent questions, you may also contact your provider using MyChart. We now offer e-Visits for anyone 18 and older to request care online for non-urgent symptoms. For details visit [email or web address].   Also download the MyChart app! Go to the app store, search "MyChart", open the app, select , and log in with your MyChart username and password.   

## 2022-11-08 NOTE — Progress Notes (Signed)
Patient took dexamethasone "around 6 o'clock" this morning.  Per Dr. Candise Che, OK to hold ordered IV dexamethasone.

## 2022-11-09 ENCOUNTER — Other Ambulatory Visit: Payer: Self-pay

## 2022-11-09 ENCOUNTER — Ambulatory Visit (INDEPENDENT_AMBULATORY_CARE_PROVIDER_SITE_OTHER): Payer: 59 | Admitting: Orthopedic Surgery

## 2022-11-09 DIAGNOSIS — M48062 Spinal stenosis, lumbar region with neurogenic claudication: Secondary | ICD-10-CM

## 2022-11-09 MED ORDER — PREGABALIN 75 MG PO CAPS
75.0000 mg | ORAL_CAPSULE | Freq: Two times a day (BID) | ORAL | 0 refills | Status: DC
  Filled 2022-11-09: qty 84, 42d supply, fill #0

## 2022-11-09 NOTE — Progress Notes (Signed)
Orthopedic Spine Surgery Office Note  Assessment: Patient is a 77 y.o. male with low back pain that radiates into his buttock and hips when upright. Resolves when sitting. MRI shows central stenosis with a facet cyst at L4/5.  Symptoms consistent with neurogenic claudication   Plan: -Discussed his options at this point included an injection or decompression surgery.  I explained that he would not be a candidate for decompression surgery since his A1c is 7.9 and he is still using nicotine.  So, I discussed an injection.  He was not interested in that and was wondering if there are any other medications he could try.  I told him the only other medication he could try would be Lyrica.  He is already on gabapentin so I told him to discontinue that while he is taking the Lyrica.  A Lyrica prescription was provided to him today. -Would need an A1C of 7.5 or less and to be nicotine free prior to any elective spine surgery -Patient should return to office in 8 weeks, x-rays at next visit: none   Patient expressed understanding of the plan and all questions were answered to the patient's satisfaction.   ___________________________________________________________________________  History: Patient is a 77 y.o. male who has been previously seen in the office for symptoms consistent with neurogenic claudication.  He is having back pain that radiates into his bilateral buttock and the lateral aspect of his hips.  He feels this activity when he is upright and walking.  He also feels that if he is standing for too long.  He can only walk a couple of minutes before the pain starts.  As soon as he sits down, the pain resolves.  He does not have any pain radiating past the mid thigh.  No paresthesias or numbness in the lower extremities.  No bowel or bladder incontinence.  Denies saddle anesthesia.  Previous treatments: NSAIDs, Tylenol, Voltaren gel, PT, gabapentin  Physical Exam:  BMI of 27.4  General: no acute  distress, appears stated age Neurologic: alert, answering questions appropriately, following commands Respiratory: unlabored breathing on room air, symmetric chest rise Psychiatric: appropriate affect, normal cadence to speech   MSK (spine):  -Strength exam      Left  Right EHL    5/5  5/5 TA    5/5  5/5 GSC    5/5  5/5 Knee extension  5/5  5/5 Hip flexion   5/5  5/5  -Sensory exam    Sensation intact to light touch in L3-S1 nerve distributions of bilateral lower extremities  -Achilles DTR: 2/4 on the left, 2/4 on the right -Patellar tendon DTR: 1/4 on the left, 1/4 on the right  -Straight leg raise: negative bilaterally -Femoral nerve stretch test: negative bilaterally -Clonus: no beats bilaterally  -Right hip exam: No pain through range of motion, negative Stinchfield, negative FABER -Left hip exam: No pain through range of motion, negative Stinchfield, negative FABER -Palpable DP pulses bilaterally  Imaging: XR of the lumbar spine from 08/25/2022 was previously independently reviewed and interpreted, showing joint space narrowing in the bilateral hips with subchondral sclerosis noted on the right hip.  Disc height loss at L3/4 and L4/5.  No evidence of instability on flexion/extension views.  No fracture or dislocation seen.   MRI of the lumbar spine from 10/19/2022 was independently reviewed and interpreted, showing lumbar synovial cyst at L4/5 that extends caudally behind the L5 body. Central stenosis at L4/5.    Patient name: Robert Sparks Patient MRN:  [id number] Date of visit: 11/09/22

## 2022-11-14 ENCOUNTER — Other Ambulatory Visit: Payer: Self-pay

## 2022-11-15 ENCOUNTER — Encounter: Payer: Self-pay | Admitting: Hematology

## 2022-11-20 ENCOUNTER — Other Ambulatory Visit: Payer: Self-pay

## 2022-11-21 ENCOUNTER — Other Ambulatory Visit: Payer: Self-pay | Admitting: *Deleted

## 2022-11-21 DIAGNOSIS — C9002 Multiple myeloma in relapse: Secondary | ICD-10-CM

## 2022-11-21 MED ORDER — POMALIDOMIDE 2 MG PO CAPS
2.0000 mg | ORAL_CAPSULE | Freq: Every day | ORAL | 0 refills | Status: DC
Start: 2022-11-21 — End: 2022-12-19

## 2022-11-21 MED FILL — Dexamethasone Sodium Phosphate Inj 100 MG/10ML: INTRAMUSCULAR | Qty: 2 | Status: AC

## 2022-11-22 ENCOUNTER — Other Ambulatory Visit: Payer: Self-pay

## 2022-11-22 ENCOUNTER — Inpatient Hospital Stay: Payer: 59

## 2022-11-22 VITALS — BP 130/71 | HR 71 | Temp 98.0°F | Resp 18 | Wt 171.1 lb

## 2022-11-22 DIAGNOSIS — C9002 Multiple myeloma in relapse: Secondary | ICD-10-CM

## 2022-11-22 DIAGNOSIS — Z5112 Encounter for antineoplastic immunotherapy: Secondary | ICD-10-CM | POA: Diagnosis not present

## 2022-11-22 DIAGNOSIS — Z7189 Other specified counseling: Secondary | ICD-10-CM

## 2022-11-22 LAB — CBC WITH DIFFERENTIAL (CANCER CENTER ONLY)
Abs Immature Granulocytes: 0.01 10*3/uL (ref 0.00–0.07)
Basophils Absolute: 0.1 10*3/uL (ref 0.0–0.1)
Basophils Relative: 2 %
Eosinophils Absolute: 0.3 10*3/uL (ref 0.0–0.5)
Eosinophils Relative: 10 %
HCT: 28.7 % — ABNORMAL LOW (ref 39.0–52.0)
Hemoglobin: 9.7 g/dL — ABNORMAL LOW (ref 13.0–17.0)
Immature Granulocytes: 0 %
Lymphocytes Relative: 33 %
Lymphs Abs: 1.1 10*3/uL (ref 0.7–4.0)
MCH: 33.6 pg (ref 26.0–34.0)
MCHC: 33.8 g/dL (ref 30.0–36.0)
MCV: 99.3 fL (ref 80.0–100.0)
Monocytes Absolute: 0.4 10*3/uL (ref 0.1–1.0)
Monocytes Relative: 13 %
Neutro Abs: 1.5 10*3/uL — ABNORMAL LOW (ref 1.7–7.7)
Neutrophils Relative %: 42 %
Platelet Count: 182 10*3/uL (ref 150–400)
RBC: 2.89 MIL/uL — ABNORMAL LOW (ref 4.22–5.81)
RDW: 14.7 % (ref 11.5–15.5)
WBC Count: 3.5 10*3/uL — ABNORMAL LOW (ref 4.0–10.5)
nRBC: 0 % (ref 0.0–0.2)

## 2022-11-22 LAB — COMPREHENSIVE METABOLIC PANEL
ALT: 24 U/L (ref 0–44)
AST: 19 U/L (ref 15–41)
Albumin: 4.1 g/dL (ref 3.5–5.0)
Alkaline Phosphatase: 70 U/L (ref 38–126)
Anion gap: 6 (ref 5–15)
BUN: 19 mg/dL (ref 8–23)
CO2: 23 mmol/L (ref 22–32)
Calcium: 9.4 mg/dL (ref 8.9–10.3)
Chloride: 109 mmol/L (ref 98–111)
Creatinine, Ser: 1.74 mg/dL — ABNORMAL HIGH (ref 0.61–1.24)
GFR, Estimated: 41 mL/min — ABNORMAL LOW (ref >60)
Glucose, Bld: 164 mg/dL — ABNORMAL HIGH (ref 70–99)
Potassium: 3.8 mmol/L (ref 3.5–5.1)
Sodium: 138 mmol/L (ref 135–145)
Total Bilirubin: 0.5 mg/dL (ref 0.3–1.2)
Total Protein: 6.1 g/dL — ABNORMAL LOW (ref 6.5–8.1)

## 2022-11-22 MED ORDER — SODIUM CHLORIDE 0.9 % IV SOLN
16.0000 mg/kg | Freq: Once | INTRAVENOUS | Status: AC
  Administered 2022-11-22: 1200 mg via INTRAVENOUS
  Filled 2022-11-22: qty 60

## 2022-11-22 MED ORDER — ACETAMINOPHEN 325 MG PO TABS
650.0000 mg | ORAL_TABLET | Freq: Once | ORAL | Status: AC
  Administered 2022-11-22: 650 mg via ORAL
  Filled 2022-11-22: qty 2

## 2022-11-22 MED ORDER — FAMOTIDINE 20 MG IN NS 100 ML IVPB
20.0000 mg | Freq: Once | INTRAVENOUS | Status: AC
  Administered 2022-11-22: 20 mg via INTRAVENOUS
  Filled 2022-11-22: qty 100

## 2022-11-22 MED ORDER — SODIUM CHLORIDE 0.9 % IV SOLN: Freq: Once | INTRAVENOUS | Status: AC

## 2022-11-22 MED ORDER — DIPHENHYDRAMINE HCL 25 MG PO CAPS
50.0000 mg | ORAL_CAPSULE | Freq: Once | ORAL | Status: AC
  Administered 2022-11-22: 50 mg via ORAL
  Filled 2022-11-22: qty 2

## 2022-11-22 NOTE — Patient Instructions (Signed)
[facility]  Discharge Instructions: Thank you for choosing [facility] to provide your oncology and hematology care.   If you have a lab appointment with the Cancer Center, please go directly to the Cancer Center and check in at the registration area.   Wear comfortable clothing and clothing appropriate for easy access to any Portacath or PICC line.   We strive to give you quality time with your provider. You may need to reschedule your appointment if you arrive late (15 or more minutes).  Arriving late affects you and other patients whose appointments are after yours.  Also, if you miss three or more appointments without notifying the office, you may be dismissed from the clinic at the provider's discretion.      For prescription refill requests, have your pharmacy contact our office and allow 72 hours for refills to be completed.    Today you received the following chemotherapy and/or immunotherapy agents: Daratumumab.       To help prevent nausea and vomiting after your treatment, we encourage you to take your nausea medication as directed.  BELOW ARE SYMPTOMS THAT SHOULD BE REPORTED IMMEDIATELY: *FEVER GREATER THAN 100.4 F (38 C) OR HIGHER *CHILLS OR SWEATING *NAUSEA AND VOMITING THAT IS NOT CONTROLLED WITH YOUR NAUSEA MEDICATION *UNUSUAL SHORTNESS OF BREATH *UNUSUAL BRUISING OR BLEEDING *URINARY PROBLEMS (pain or burning when urinating, or frequent urination) *BOWEL PROBLEMS (unusual diarrhea, constipation, pain near the anus) TENDERNESS IN MOUTH AND THROAT WITH OR WITHOUT PRESENCE OF ULCERS (sore throat, sores in mouth, or a toothache) UNUSUAL RASH, SWELLING OR PAIN  UNUSUAL VAGINAL DISCHARGE OR ITCHING   Items with * indicate a potential emergency and should be followed up as soon as possible or go to the Emergency Department if any problems should occur.  Please show the CHEMOTHERAPY ALERT CARD or IMMUNOTHERAPY ALERT CARD at  check-in to the Emergency Department and triage nurse.  Should you have questions after your visit or need to cancel or reschedule your appointment, please contact [facility]  Dept: [phone]  and follow the prompts.  Office hours are 8:00 a.m. to 4:30 p.m. Monday - Friday. Please note that voicemails left after 4:00 p.m. may not be returned until the following business day.  We are closed weekends and major holidays. You have access to a nurse at all times for urgent questions. Please call the main number to the clinic Dept: [phone] and follow the prompts.   For any non-urgent questions, you may also contact your provider using MyChart. We now offer e-Visits for anyone 18 and older to request care online for non-urgent symptoms. For details visit [email or web address].   Also download the MyChart app! Go to the app store, search "MyChart", open the app, select , and log in with your MyChart username and password.   

## 2022-11-22 NOTE — Progress Notes (Signed)
Patient took his decadron at 0700 this morning.

## 2022-11-27 ENCOUNTER — Other Ambulatory Visit: Payer: Self-pay

## 2022-11-30 LAB — MULTIPLE MYELOMA PANEL, SERUM
Albumin SerPl Elph-Mcnc: 3.6 g/dL (ref 2.9–4.4)
Albumin/Glob SerPl: 1.6 (ref 0.7–1.7)
Alpha 1: 0.2 g/dL (ref 0.0–0.4)
Alpha2 Glob SerPl Elph-Mcnc: 0.9 g/dL (ref 0.4–1.0)
B-Globulin SerPl Elph-Mcnc: 0.9 g/dL (ref 0.7–1.3)
Gamma Glob SerPl Elph-Mcnc: 0.5 g/dL (ref 0.4–1.8)
Globulin, Total: 2.4 g/dL (ref 2.2–3.9)
IgA: 93 mg/dL (ref 61–437)
IgG (Immunoglobin G), Serum: 532 mg/dL — ABNORMAL LOW (ref 603–1613)
IgM (Immunoglobulin M), Srm: 32 mg/dL (ref 15–143)
M Protein SerPl Elph-Mcnc: 0.2 g/dL — ABNORMAL HIGH
Total Protein ELP: 6 g/dL (ref 6.0–8.5)

## 2022-12-02 ENCOUNTER — Encounter (HOSPITAL_COMMUNITY): Payer: Self-pay | Admitting: Emergency Medicine

## 2022-12-02 ENCOUNTER — Ambulatory Visit (HOSPITAL_COMMUNITY)
Admission: EM | Admit: 2022-12-02 | Discharge: 2022-12-02 | Disposition: A | Discharge status: Home / Self Care | Payer: 59 | Attending: Internal Medicine | Admitting: Internal Medicine

## 2022-12-02 ENCOUNTER — Other Ambulatory Visit: Payer: Self-pay

## 2022-12-02 DIAGNOSIS — M25511 Pain in right shoulder: Secondary | ICD-10-CM | POA: Diagnosis not present

## 2022-12-02 MED ORDER — TIZANIDINE HCL 2 MG PO CAPS: 2.0000 mg | ORAL_CAPSULE | Freq: Three times a day (TID) | ORAL | 0 refills | Status: DC

## 2022-12-02 MED ORDER — ACETAMINOPHEN 500 MG PO TABS: 1000.0000 mg | ORAL_TABLET | Freq: Four times a day (QID) | ORAL | 0 refills | Status: AC | PRN

## 2022-12-02 NOTE — Discharge Instructions (Signed)
Your pain is likely due to a muscle strain which will improve on its own with time.   - Take Tylenol 1000 mg every 6 hours as needed for aches and pains. - You may also take the prescribed muscle relaxer as directed as needed for muscle aches/spasm.  Do not take this medication and drive or drink alcohol as it can make you sleepy.  Mainly use this medicine at nighttime as needed. - Apply heat 20 minutes on then 20 minutes off and perform gentle range of motion exercises to the area of greatest pain to prevent muscle stiffness and provide further pain relief.   Red flag symptoms to watch out for are numbness/tingling to the legs, weakness, loss of bowel/bladder control, and/or worsening pain that does not respond well to medicines. Follow-up with your primary care provider or return to urgent care if your symptoms do not improve in the next 3 to 4 days with medications and interventions recommended today. If your symptoms are severe (red flag), please go to the emergency room.  I hope you feel better!

## 2022-12-02 NOTE — ED Provider Notes (Signed)
[facility]    CSN: [id number] Arrival date & time: 12/02/22  1255      History   Chief Complaint Chief Complaint  Patient presents with   Shoulder Pain    Pt c/o right shoulder pain for the past 4 days, denies any fall or injury.    HPI Robert Sparks is a 77 y.o. male.   Patient presents to urgent care for evaluation of atraumatic right shoulder pain that started approximately 4 to 5 days ago.  Patient reports history of arthritis to the right shoulder but denies previous injury to the right shoulder.  Pain is localized around the muscles and joint of the right shoulder and does not radiate.  Moving the right arm at the shoulder joint makes pain worse, rest improves pain and he does not experience any pain when he is relaxing his right arm at his body.  No numbness or tingling distally to injury, neck pain, shortness of breath, dizziness, recent cough, abdominal pain, rash to overlying skin of the right shoulder, or right elbow pain.  Past medical history significant for chronic kidney disease stage III, type 2 diabetes on insulin, arthritis to the right shoulder, prostate cancer, multiple myeloma, and anemia of chronic disease.  He has been taking ibuprofen to help with pain and states this helps a little bit.    Shoulder Pain   Past Medical History:  Diagnosis Date   Anemia    Arthritis    shoulders,feet    Cataract    per eye dr -has appt 5-11   CKD (chronic kidney disease), stage III (HCC)    Elevated PSA    History of ketoacidosis    03-29-2015    History of sepsis    07-25-2013  non-compliant w/ medication   Hyperlipidemia    Hypertension    Nocturia    Peripheral neuropathy    Prostate cancer (HCC)    Type 2 diabetes mellitus with insulin therapy [facility])     Patient Active Problem List   Diagnosis Date Noted   Drug-induced polyneuropathy (HCC) 09/07/2022   Pain in right shoulder 01/06/2022   Port-A-Cath in place 11/02/2021   Macroalbuminuric  diabetic nephropathy (HCC) 08/31/2021   Multiple myeloma in relapse (HCC) 06/24/2021   Counseling regarding advance care planning and goals of care 06/24/2021   Memory changes 04/25/2019   Hyperlipidemia associated with type 2 diabetes mellitus (HCC) 04/25/2019   Chronic bilateral low back pain without sciatica 03/14/2019   Spinal stenosis, lumbar region with neurogenic claudication 03/14/2019   History of prostate cancer 12/20/2018   Neuropathy of right hand 12/20/2018   Primary osteoarthritis of right knee 01/07/2018   Diabetic polyneuropathy associated with type 2 diabetes mellitus (HCC) 01/07/2018   Vitamin B12 deficiency 09/04/2017   Cataract of both eyes 04/12/2017   Tobacco dependence 04/12/2017   Multiple myeloma in remission (HCC) 02/23/2017   Hyperlipidemia 06/21/2016   CKD (chronic kidney disease) stage 3, GFR 30-59 ml/min (HCC) 02/23/2016   Malignant neoplasm of prostate (HCC) 02/21/2016   Controlled type 2 diabetes mellitus with stage 3 chronic kidney disease, with long-term current use of insulin (HCC) 03/29/2015   HTN (hypertension) 07/28/2013   Anemia, chronic disease 07/26/2013   ETOH abuse 07/25/2013    Past Surgical History:  Procedure Laterality Date   CIRCUMCISION  1990's   PROSTATE BIOPSY N/A 12/21/2015   Procedure: BIOPSY TRANSRECTAL ULTRASONIC PROSTATE (TUBP);  Surgeon: Jerilee Field, MD;  Location: [facility];  Service: Urology;  Laterality: N/A;       Home Medications    Prior to Admission medications   Medication Sig Start Date End Date Taking? Authorizing Provider  acetaminophen (TYLENOL) 500 MG tablet Take 2 tablets (1,000 mg total) by mouth every 6 (six) hours as needed. 12/02/22  Yes Carlisle Beers, FNP  tizanidine (ZANAFLEX) 2 MG capsule Take 1 capsule (2 mg total) by mouth 3 (three) times daily. 12/02/22  Yes Carlisle Beers, FNP  Accu-Chek Softclix Lancets lancets Use to test blood sugar 3 times daily. 09/14/22    Marcine Matar, MD  acyclovir (ZOVIRAX) 400 MG tablet Take 1 tablet (400 mg total) by mouth 2 (two) times daily. 07/11/22   Johney Maine, MD  amLODipine (NORVASC) 10 MG tablet TAKE 1 TABLET (10 MG TOTAL) BY MOUTH DAILY. 09/05/22   Marcine Matar, MD  aspirin EC 81 MG tablet Take 81 mg by mouth daily. Swallow whole.    [provider]  b complex vitamins capsule Take 1 capsule by mouth daily. 07/16/18   Johney Maine, MD  Blood Glucose Monitoring Suppl (ACCU-CHEK GUIDE) w/Device KIT Use to test blood sugar three times daily 09/14/22   Marcine Matar, MD  Continuous Blood Gluc Receiver (FREESTYLE LIBRE READER) DEVI Use to check blood sugar 3x daily. E11.42 01/02/22   Marcine Matar, MD  Continuous Blood Gluc Sensor (FREESTYLE LIBRE SENSOR SYSTEM) MISC Use to check blood sugar 3 times daily. Change sensor every 2 weeks. 01/02/22   Marcine Matar, MD  dexamethasone (DECADRON) 4 MG tablet 20mg  (5 tabs) Take the day after each dose of daratumumab. Take with breakfast 10/06/21   Johney Maine, MD  diclofenac Sodium (VOLTAREN) 1 % GEL Apply 4 g topically 4 (four) times daily. 06/13/19   Lovorn, Aundra Millet, MD  donepezil (ARICEPT) 5 MG tablet TAKE 1 TABLET (5 MG TOTAL) BY MOUTH AT BEDTIME. FOR MEMORY ISSUES 08/29/21 08/29/22  Marcine Matar, MD  ferrous sulfate 325 (65 FE) MG tablet Take 325 mg by mouth daily.    [provider]  furosemide (LASIX) 20 MG tablet Take 1 tablet (20 mg total) by mouth daily in the morning for 7 days (until 09/08/22). Save remaining tablets. 08/31/22   Zetta Bills, MD  glucose blood (ACCU-CHEK GUIDE) test strip Use to test blood sugar 3 times daily. 09/14/22   Marcine Matar, MD  hydrOXYzine (ATARAX) 25 MG tablet TAKE 1 TABLET (25 MG TOTAL) BY MOUTH 3 (THREE) TIMES DAILY AS NEEDED FOR ITCHING (RASH). 03/14/22   Johney Maine, MD  Insulin Lispro Prot & Lispro (HUMALOG MIX 75/25 KWIKPEN) (75-25) 100 UNIT/ML Kwikpen Inject 8 units  subcutaneously with breakfast and dinner. Increase to 10 units for 3-5 days twice a month after dexamethasone. 09/18/22   Marcine Matar, MD  Insulin Pen Needle (TECHLITE PEN NEEDLES) 32G X 4 MM MISC USE AS DIRECTED TWICE DAILY WITH INSULIN 10/31/22   Marcine Matar, MD  ketoconazole (NIZORAL) 2 % cream Apply to skin of feet twice daily 10/04/22   Delories Heinz, DPM  Miconazole Nitrate (ATHLETES FOOT POWDER SPRAY) 2 % AERP Spray between toes and under toes once daily. Patient not taking: Reported on 11/03/2022 08/20/20   Freddie Breech, DPM  Multiple Vitamin (MULTIVITAMIN WITH MINERALS) TABS tablet Take 1 tablet by mouth daily. 11/12/16   Arnetha Courser, MD  ondansetron (ZOFRAN) 8 MG tablet Take 1 tablet (8 mg total) by mouth 2 (two) times daily as  needed (Nausea or vomiting). Patient not taking: Reported on 11/03/2022 06/24/21   Johney Maine, MD  polyethylene glycol (MIRALAX) 17 g packet Take 17 g by mouth daily. 02/24/22   Johney Maine, MD  pomalidomide (POMALYST) 2 MG capsule Take 1 capsule (2 mg total) by mouth daily. 3 weeks on 1 week off 11/21/22   Johney Maine, MD  pravastatin (PRAVACHOL) 20 MG tablet Take 1 tablet (20 mg total) by mouth daily. 11/06/22   Marcine Matar, MD  pregabalin (LYRICA) 75 MG capsule Take 1 capsule (75 mg total) by mouth 2 (two) times daily. 11/09/22 12/22/22  London Sheer, MD  prochlorperazine (COMPAZINE) 10 MG tablet Take 1 tablet (10 mg total) by mouth every 6 (six) hours as needed (Nausea or vomiting). Patient not taking: Reported on 11/03/2022 06/24/21   Johney Maine, MD  tadalafil (CIALIS) 20 MG tablet Take 20 mg by mouth as needed. 12/10/19   [provider]  valsartan (DIOVAN) 40 MG tablet Take 1 tablet (40 mg total) by mouth daily. STOP LISINOPRIL 05/08/22   Marcine Matar, MD  vitamin B-12 (CYANOCOBALAMIN) 100 MCG tablet Take 100 mcg by mouth daily.    [provider]    Family History Family  History  Problem Relation Age of Onset   Kidney disease Mother    Cancer Neg Hx    Colon cancer Neg Hx    Colon polyps Neg Hx    Esophageal cancer Neg Hx    Rectal cancer Neg Hx    Stomach cancer Neg Hx     Social History Social History   Tobacco Use   Smoking status: Every Day    Packs/day: 0.50    Years: 50.00    Additional pack years: 0.00    Total pack years: 25.00    Types: Cigarettes   Smokeless tobacco: Never   Tobacco comments:    1 ppwk-4-5 a day   Vaping Use   Vaping Use: Never used  Substance Use Topics   Alcohol use: Yes    Alcohol/week: 1.0 standard drink of alcohol    Types: 1 Cans of beer per week    Comment: 1 every day-quit couple weeks ago   Drug use: No     Allergies   Other and Lisinopril   Review of Systems Review of Systems Per HPI  Physical Exam Triage Vital Signs ED Triage Vitals [12/02/22 1318]  Enc Vitals Group     BP (!) 145/67     Pulse Rate 87     Resp 16     Temp 98 F (36.7 C)     Temp Source Oral     SpO2 96 %     Weight 171 lb 15.3 oz (78 kg)     Height 5\' 7"  (1.702 m)     Head Circumference      Peak Flow      Pain Score 10     Pain Loc      Pain Edu?      Excl. in GC?    No data found.  Updated Vital Signs BP (!) 145/67 (BP Location: Right Arm)   Pulse 87   Temp 98 F (36.7 C) (Oral)   Resp 16   Ht 5\' 7"  (1.702 m)   Wt 171 lb 15.3 oz (78 kg)   SpO2 96%   BMI 26.93 kg/m   Visual Acuity Right Eye Distance:   Left Eye Distance:   Bilateral Distance:  Right Eye Near:   Left Eye Near:    Bilateral Near:     Physical Exam Vitals and nursing note reviewed.  Constitutional:      Appearance: He is not ill-appearing or toxic-appearing.  HENT:     Head: Normocephalic and atraumatic.     Right Ear: Hearing and external ear normal.     Left Ear: Hearing and external ear normal.     Nose: Nose normal.     Mouth/Throat:     Lips: Pink.     Mouth: Mucous membranes are moist. No injury.     Tongue:  No lesions. Tongue does not deviate from midline.     Palate: No mass and lesions.     Pharynx: Oropharynx is clear. Uvula midline. No pharyngeal swelling, oropharyngeal exudate, posterior oropharyngeal erythema or uvula swelling.     Tonsils: No tonsillar exudate or tonsillar abscesses.  Eyes:     General: Lids are normal. Vision grossly intact. Gaze aligned appropriately.     Extraocular Movements: Extraocular movements intact.     Conjunctiva/sclera: Conjunctivae normal.  Cardiovascular:     Rate and Rhythm: Normal rate and regular rhythm.     Heart sounds: Normal heart sounds, S1 normal and S2 normal.  Pulmonary:     Effort: Pulmonary effort is normal. No respiratory distress.     Breath sounds: Normal breath sounds and air entry.  Musculoskeletal:     Right shoulder: Tenderness (Diffuse tenderness to the musculature of the right shoulder.) and bony tenderness present. No swelling, deformity, effusion, laceration or crepitus. Decreased range of motion (Decreased range of motion with active and passive range of motion secondary to pain-able to abduct right upper extremity at the shoulder joint to 90 degrees with forward and lateral flexion). Normal strength (5/5 grip strength to bilateral upper extremities). Normal pulse (+2 right brachial pulse).     Left shoulder: Normal.     Cervical back: Normal range of motion and neck supple.  Skin:    General: Skin is warm and dry.     Capillary Refill: Capillary refill takes less than 2 seconds.     Findings: No rash.  Neurological:     General: No focal deficit present.     Mental Status: He is alert and oriented to person, place, and time. Mental status is at baseline.     Cranial Nerves: No dysarthria or facial asymmetry.  Psychiatric:        Mood and Affect: Mood normal.        Speech: Speech normal.        Behavior: Behavior normal.        Thought Content: Thought content normal.        Judgment: Judgment normal.      UC Treatments  / Results  Labs (all labs ordered are listed, but only abnormal results are displayed) Labs Reviewed - No data to display  EKG   Radiology No results found.  Procedures Procedures (including critical care time)  Medications Ordered in UC Medications - No data to display  Initial Impression / Assessment and Plan / UC Course  I have reviewed the triage vital signs and the nursing notes.  Pertinent labs & imaging results that were available during my care of the patient were reviewed by me and considered in my medical decision making (see chart for details).   1. Acute pain of the right shoulder Presentation is consistent with acute muscle strain of the right shoulder that will likely  resolve with rest, fluids, as needed use of zanaflex and muscle relaxer, heat, and gentle range of motion exercises. May take tylenol 1,000mg  every 6 hours and zanaflex 2mg  muscle relaxer every 8 hours as needed for muscle spasm. Drowsiness precautions regarding muscle relaxer use discussed. Heat and gentle ROM exercises discussed. Deferred imaging today based on stable musculoskeletal exam findings and hemodynamically stable vital signs. Walking referral given to orthopedic provider should symptoms fail to improve in the next 1-2 weeks.   Discussed physical exam and available lab work findings in clinic with patient.  Counseled patient regarding appropriate use of medications and potential side effects for all medications recommended or prescribed today. Discussed red flag signs and symptoms of worsening condition,when to call the PCP office, return to urgent care, and when to seek higher level of care in the emergency department. Patient verbalizes understanding and agreement with plan. All questions answered. Patient discharged in stable condition.    Final Clinical Impressions(s) / UC Diagnoses   Final diagnoses:  Acute pain of right shoulder     Discharge Instructions      Your pain is likely  due to a muscle strain which will improve on its own with time.   - Take Tylenol 1000 mg every 6 hours as needed for aches and pains. - You may also take the prescribed muscle relaxer as directed as needed for muscle aches/spasm.  Do not take this medication and drive or drink alcohol as it can make you sleepy.  Mainly use this medicine at nighttime as needed. - Apply heat 20 minutes on then 20 minutes off and perform gentle range of motion exercises to the area of greatest pain to prevent muscle stiffness and provide further pain relief.   Red flag symptoms to watch out for are numbness/tingling to the legs, weakness, loss of bowel/bladder control, and/or worsening pain that does not respond well to medicines. Follow-up with your primary care provider or return to urgent care if your symptoms do not improve in the next 3 to 4 days with medications and interventions recommended today. If your symptoms are severe (red flag), please go to the emergency room.  I hope you feel better!      ED Prescriptions     Medication Sig Dispense Auth. Provider   tizanidine (ZANAFLEX) 2 MG capsule Take 1 capsule (2 mg total) by mouth 3 (three) times daily. 10 capsule Carlisle Beers, FNP   acetaminophen (TYLENOL) 500 MG tablet Take 2 tablets (1,000 mg total) by mouth every 6 (six) hours as needed. 30 tablet Carlisle Beers, FNP      PDMP not reviewed this encounter.   Carlisle Beers, [place] 12/03/22 [phone]

## 2022-12-02 NOTE — ED Triage Notes (Signed)
Pt c/o right shoulder pain for the past 4 days, denies any fall or injury.

## 2022-12-05 MED FILL — Dexamethasone Sodium Phosphate Inj 100 MG/10ML: INTRAMUSCULAR | Qty: 2 | Status: AC

## 2022-12-06 ENCOUNTER — Inpatient Hospital Stay: Payer: 59

## 2022-12-06 ENCOUNTER — Inpatient Hospital Stay (HOSPITAL_BASED_OUTPATIENT_CLINIC_OR_DEPARTMENT_OTHER): Payer: 59 | Admitting: Hematology

## 2022-12-06 ENCOUNTER — Other Ambulatory Visit: Payer: Self-pay

## 2022-12-06 VITALS — BP 134/66 | HR 79 | Temp 98.3°F | Resp 18 | Wt 170.8 lb

## 2022-12-06 DIAGNOSIS — Z7189 Other specified counseling: Secondary | ICD-10-CM

## 2022-12-06 DIAGNOSIS — M542 Cervicalgia: Secondary | ICD-10-CM | POA: Diagnosis not present

## 2022-12-06 DIAGNOSIS — Z5111 Encounter for antineoplastic chemotherapy: Secondary | ICD-10-CM | POA: Diagnosis not present

## 2022-12-06 DIAGNOSIS — R29898 Other symptoms and signs involving the musculoskeletal system: Secondary | ICD-10-CM

## 2022-12-06 DIAGNOSIS — C9002 Multiple myeloma in relapse: Secondary | ICD-10-CM

## 2022-12-06 DIAGNOSIS — Z5112 Encounter for antineoplastic immunotherapy: Secondary | ICD-10-CM | POA: Diagnosis not present

## 2022-12-06 LAB — CBC WITH DIFFERENTIAL (CANCER CENTER ONLY)
Abs Immature Granulocytes: 0.01 10*3/uL (ref 0.00–0.07)
Basophils Absolute: 0.1 10*3/uL (ref 0.0–0.1)
Basophils Relative: 1 %
Eosinophils Absolute: 0.1 10*3/uL (ref 0.0–0.5)
Eosinophils Relative: 4 %
HCT: 29.6 % — ABNORMAL LOW (ref 39.0–52.0)
Hemoglobin: 10.1 g/dL — ABNORMAL LOW (ref 13.0–17.0)
Immature Granulocytes: 0 %
Lymphocytes Relative: 32 %
Lymphs Abs: 1.2 10*3/uL (ref 0.7–4.0)
MCH: 33.3 pg (ref 26.0–34.0)
MCHC: 34.1 g/dL (ref 30.0–36.0)
MCV: 97.7 fL (ref 80.0–100.0)
Monocytes Absolute: 0.8 10*3/uL (ref 0.1–1.0)
Monocytes Relative: 21 %
Neutro Abs: 1.5 10*3/uL — ABNORMAL LOW (ref 1.7–7.7)
Neutrophils Relative %: 42 %
Platelet Count: 268 10*3/uL (ref 150–400)
RBC: 3.03 MIL/uL — ABNORMAL LOW (ref 4.22–5.81)
RDW: 14.6 % (ref 11.5–15.5)
WBC Count: 3.6 10*3/uL — ABNORMAL LOW (ref 4.0–10.5)
nRBC: 0 % (ref 0.0–0.2)

## 2022-12-06 LAB — COMPREHENSIVE METABOLIC PANEL
ALT: 19 U/L (ref 0–44)
AST: 18 U/L (ref 15–41)
Albumin: 4.1 g/dL (ref 3.5–5.0)
Alkaline Phosphatase: 46 U/L (ref 38–126)
Anion gap: 8 (ref 5–15)
BUN: 16 mg/dL (ref 8–23)
CO2: 24 mmol/L (ref 22–32)
Calcium: 9.9 mg/dL (ref 8.9–10.3)
Chloride: 107 mmol/L (ref 98–111)
Creatinine, Ser: 1.41 mg/dL — ABNORMAL HIGH (ref 0.61–1.24)
GFR, Estimated: 52 mL/min — ABNORMAL LOW (ref >60)
Glucose, Bld: 185 mg/dL — ABNORMAL HIGH (ref 70–99)
Potassium: 3.4 mmol/L — ABNORMAL LOW (ref 3.5–5.1)
Sodium: 139 mmol/L (ref 135–145)
Total Bilirubin: 0.7 mg/dL (ref 0.3–1.2)
Total Protein: 6.5 g/dL (ref 6.5–8.1)

## 2022-12-06 MED ORDER — DIPHENHYDRAMINE HCL 25 MG PO CAPS
50.0000 mg | ORAL_CAPSULE | Freq: Once | ORAL | Status: AC
  Administered 2022-12-06: 50 mg via ORAL
  Filled 2022-12-06: qty 2

## 2022-12-06 MED ORDER — ACETAMINOPHEN 325 MG PO TABS
650.0000 mg | ORAL_TABLET | Freq: Once | ORAL | Status: AC
  Administered 2022-12-06: 650 mg via ORAL
  Filled 2022-12-06: qty 2

## 2022-12-06 MED ORDER — FAMOTIDINE IN NACL 20-0.9 MG/50ML-% IV SOLN
20.0000 mg | Freq: Once | INTRAVENOUS | Status: AC
  Administered 2022-12-06: 20 mg via INTRAVENOUS
  Filled 2022-12-06: qty 50

## 2022-12-06 MED ORDER — SODIUM CHLORIDE 0.9 % IV SOLN: Freq: Once | INTRAVENOUS | Status: AC

## 2022-12-06 MED ORDER — SODIUM CHLORIDE 0.9 % IV SOLN
16.0000 mg/kg | Freq: Once | INTRAVENOUS | Status: AC
  Administered 2022-12-06: 1200 mg via INTRAVENOUS
  Filled 2022-12-06: qty 60

## 2022-12-06 NOTE — Progress Notes (Signed)
Pt states he took decadron at home prior to arriving for infusion.  

## 2022-12-06 NOTE — Patient Instructions (Signed)
[name]  Discharge Instructions: Thank you for choosing [facility] to provide your oncology and hematology care.   If you have a lab appointment with the Cancer Center, please go directly to the Cancer Center and check in at the registration area.   Wear comfortable clothing and clothing appropriate for easy access to any Portacath or PICC line.   We strive to give you quality time with your provider. You may need to reschedule your appointment if you arrive late (15 or more minutes).  Arriving late affects you and other patients whose appointments are after yours.  Also, if you miss three or more appointments without notifying the office, you may be dismissed from the clinic at the provider's discretion.      For prescription refill requests, have your pharmacy contact our office and allow 72 hours for refills to be completed.    Today you received the following chemotherapy and/or immunotherapy agents: Daratumumab.       To help prevent nausea and vomiting after your treatment, we encourage you to take your nausea medication as directed.  BELOW ARE SYMPTOMS THAT SHOULD BE REPORTED IMMEDIATELY: *FEVER GREATER THAN 100.4 F (38 C) OR HIGHER *CHILLS OR SWEATING *NAUSEA AND VOMITING THAT IS NOT CONTROLLED WITH YOUR NAUSEA MEDICATION *UNUSUAL SHORTNESS OF BREATH *UNUSUAL BRUISING OR BLEEDING *URINARY PROBLEMS (pain or burning when urinating, or frequent urination) *BOWEL PROBLEMS (unusual diarrhea, constipation, pain near the anus) TENDERNESS IN MOUTH AND THROAT WITH OR WITHOUT PRESENCE OF ULCERS (sore throat, sores in mouth, or a toothache) UNUSUAL RASH, SWELLING OR PAIN  UNUSUAL VAGINAL DISCHARGE OR ITCHING   Items with * indicate a potential emergency and should be followed up as soon as possible or go to the Emergency Department if any problems should occur.  Please show the CHEMOTHERAPY ALERT CARD or IMMUNOTHERAPY ALERT CARD at  check-in to the Emergency Department and triage nurse.  Should you have questions after your visit or need to cancel or reschedule your appointment, please contact [facility]  Dept: [phone]  and follow the prompts.  Office hours are 8:00 a.m. to 4:30 p.m. Monday - Friday. Please note that voicemails left after 4:00 p.m. may not be returned until the following business day.  We are closed weekends and major holidays. You have access to a nurse at all times for urgent questions. Please call the main number to the clinic Dept: [phone] and follow the prompts.   For any non-urgent questions, you may also contact your provider using MyChart. We now offer e-Visits for anyone 18 and older to request care online for non-urgent symptoms. For details visit [email or web address].   Also download the MyChart app! Go to the app store, search "MyChart", open the app, select [facility], and log in with your MyChart username and password.   

## 2022-12-06 NOTE — Progress Notes (Signed)
HEMATOLOGY/ONCOLOGY CLINIC NOTE  Date of Service: 12/06/22   Patient Care Team: Marcine Matar, MD as PCP - General (Internal Medicine)  CHIEF COMPLAINTS/PURPOSE OF CONSULTATION:  Follow-up for continued evaluation and management of relapsed multiple myeloma  Oncologic History:  77 y.o. male with diagnosis of IgA lambda active multiple myeloma, ISS Stage I. Active disease diagnosed based on presence of anemia, kidney insufficiency, and paraproteinemia with significant predominance of lambda light chains as well as significant elevation of IgA. Bone marrow biopsy confirmed presence of atypical monoclonal plasma cell process in the bone marrow comprising at least 7% of the cellularity. Based on the findings, patient was started on treatment with lenalidomide, bortezomib, and low-dose dexamethasone based on the anticipated tolerance by the patient. Lenalidomide was started at 10 mg daily based on creatinine clearance of 42, dexamethasone dose was reduced to 20 mg weekly based on patient's age. Treatment was complicated by rapid development of cutaneous rash attributed to lenalidomide, inaddition to decreased renal function and now patient has persistent severe anemia. Side-effects were attributed to Lenalidomide and lenalidomide was discontinued with subsequent recovery.  Patient has been receiving bortezomib weekly with low-dose dexamethasone in 4-day cycles.   Patient has completed induction systemic therapy for his disease with repeat bone marrow biopsy confirming complete response including no evidence of minimal residual disease by cytogenetics or FISH.  HISTORY OF PRESENTING ILLNESS:  Please see previous note for details of initial presentation.  INTERVAL HISTORY:   [name] is a 77 y.o. male is here for continued evaluation and management of his relapsed multiple myeloma. Patient was last seen by me on 11/08/2022.  Today, he complains of sudden worsened right shoulder  pain. He also complains of mild generalized neck pain which worsened a couple weeks ago. He reports that his pain radiates down his right arm. He denies any radiating pain to left arm. He had difficulty lifting right arm and notes pain with aid lifting the arm. He denies any fall or injuries to the shoulder. He denies any heavy lifting/pulling/pushing. Patient presented to urgent care and was told to take muscle spasms tablets.   He reports tightness in his back and hip. He denies any fever, chill, new bone pain, abdominal pain, or leg swelling. He sometimes has night sweats. He has normal p.o. intake and hydration.  MEDICAL HISTORY:  Past Medical History:  Diagnosis Date   Anemia    Arthritis    shoulders,feet    Cataract    per eye dr -has appt 5-11   CKD (chronic kidney disease), stage III (HCC)    Elevated PSA    History of ketoacidosis    03-29-2015    History of sepsis    07-25-2013  non-compliant w/ medication   Hyperlipidemia    Hypertension    Nocturia    Peripheral neuropathy    Prostate cancer (HCC)    Type 2 diabetes mellitus with insulin therapy [facility])     SURGICAL HISTORY: Past Surgical History:  Procedure Laterality Date   CIRCUMCISION  1990's   PROSTATE BIOPSY N/A 12/21/2015   Procedure: BIOPSY TRANSRECTAL ULTRASONIC PROSTATE (TUBP);  Surgeon: Jerilee Field, MD;  Location: [facility];  Service: Urology;  Laterality: N/A;    SOCIAL HISTORY: Social History   Socioeconomic History   Marital status: Single    Spouse name: Not on file   Number of children: Not on file   Years of education: Not on file   Highest education  level: Not on file  Occupational History   Not on file  Tobacco Use   Smoking status: Every Day    Packs/day: 0.50    Years: 50.00    Additional pack years: 0.00    Total pack years: 25.00    Types: Cigarettes   Smokeless tobacco: Never   Tobacco comments:    1 ppwk-4-5 a day   Vaping Use   Vaping Use: Never used   Substance and Sexual Activity   Alcohol use: Yes    Alcohol/week: 1.0 standard drink of alcohol    Types: 1 Cans of beer per week    Comment: 1 every day-quit couple weeks ago   Drug use: No   Sexual activity: Not Currently  Other Topics Concern   Not on file  Social History Narrative   Not on file   Social Determinants of Health   Financial Resource Strain: Low Risk  (11/03/2022)   Overall Financial Resource Strain (CARDIA)    Difficulty of Paying Living Expenses: Not hard at all  Food Insecurity: No Food Insecurity (09/15/2022)   Hunger Vital Sign    Worried About Running Out of Food in the Last Year: Never true    Ran Out of Food in the Last Year: Never true  Transportation Needs: No Transportation Needs (09/15/2022)   PRAPARE - Administrator, Civil Service (Medical): No    Lack of Transportation (Non-Medical): No  Physical Activity: Insufficiently Active (11/03/2022)   Exercise Vital Sign    Days of Exercise per Week: 1 day    Minutes of Exercise per Session: 20 min  Stress: No Stress Concern Present (11/03/2022)   [employer] of Occupational Health - Occupational Stress Questionnaire    Feeling of Stress : Not at all  Social Connections: Socially Isolated (11/03/2022)   Social Connection and Isolation Panel [NHANES]    Frequency of Communication with Friends and Family: Once a week    Frequency of Social Gatherings with Friends and Family: Once a week    Attends Religious Services: 1 to 4 times per year    Active Member of [employer] or Organizations: No    Attends Engineer, structural: Never    Marital Status: Never married  Catering manager Violence: Not on file    FAMILY HISTORY: Family History  Problem Relation Age of Onset   Kidney disease Mother    Cancer Neg Hx    Colon cancer Neg Hx    Colon polyps Neg Hx    Esophageal cancer Neg Hx    Rectal cancer Neg Hx    Stomach cancer Neg Hx     ALLERGIES:  is allergic to other and  lisinopril.  MEDICATIONS:  Current Outpatient Medications  Medication Sig Dispense Refill   Accu-Chek Softclix Lancets lancets Use to test blood sugar 3 times daily. 100 each 3   acetaminophen (TYLENOL) 500 MG tablet Take 2 tablets (1,000 mg total) by mouth every 6 (six) hours as needed. 30 tablet 0   acyclovir (ZOVIRAX) 400 MG tablet Take 1 tablet (400 mg total) by mouth 2 (two) times daily. 60 tablet 11   amLODipine (NORVASC) 10 MG tablet TAKE 1 TABLET (10 MG TOTAL) BY MOUTH DAILY. 90 tablet 0   aspirin EC 81 MG tablet Take 81 mg by mouth daily. Swallow whole.     b complex vitamins capsule Take 1 capsule by mouth daily. 30 capsule 5   Blood Glucose Monitoring Suppl (ACCU-CHEK GUIDE) w/Device KIT Use  to test blood sugar three times daily 1 kit 0   Continuous Blood Gluc Receiver (FREESTYLE LIBRE READER) DEVI Use to check blood sugar 3x daily. E11.42 1 each 0   Continuous Blood Gluc Sensor (FREESTYLE LIBRE SENSOR SYSTEM) MISC Use to check blood sugar 3 times daily. Change sensor every 2 weeks. 2 each 12   dexamethasone (DECADRON) 4 MG tablet 20mg  (5 tabs) Take the day after each dose of daratumumab. Take with breakfast 20 tablet 11   diclofenac Sodium (VOLTAREN) 1 % GEL Apply 4 g topically 4 (four) times daily. 150 g 5   donepezil (ARICEPT) 5 MG tablet TAKE 1 TABLET (5 MG TOTAL) BY MOUTH AT BEDTIME. FOR MEMORY ISSUES 30 tablet 5   ferrous sulfate 325 (65 FE) MG tablet Take 325 mg by mouth daily.     furosemide (LASIX) 20 MG tablet Take 1 tablet (20 mg total) by mouth daily in the morning for 7 days (until 09/08/22). Save remaining tablets. 30 tablet 0   glucose blood (ACCU-CHEK GUIDE) test strip Use to test blood sugar 3 times daily. 100 each 2   hydrOXYzine (ATARAX) 25 MG tablet TAKE 1 TABLET (25 MG TOTAL) BY MOUTH 3 (THREE) TIMES DAILY AS NEEDED FOR ITCHING (RASH). 30 tablet 0   Insulin Lispro Prot & Lispro (HUMALOG MIX 75/25 KWIKPEN) (75-25) 100 UNIT/ML Kwikpen Inject 8 units subcutaneously  with breakfast and dinner. Increase to 10 units for 3-5 days twice a month after dexamethasone. 15 mL 11   Insulin Pen Needle (TECHLITE PEN NEEDLES) 32G X 4 MM MISC USE AS DIRECTED TWICE DAILY WITH INSULIN 100 each 0   ketoconazole (NIZORAL) 2 % cream Apply to skin of feet twice daily 60 g 2   Miconazole Nitrate (ATHLETES FOOT POWDER SPRAY) 2 % AERP Spray between toes and under toes once daily. (Patient not taking: Reported on 11/03/2022) 130 g 4   Multiple Vitamin (MULTIVITAMIN WITH MINERALS) TABS tablet Take 1 tablet by mouth daily. 60 tablet 2   ondansetron (ZOFRAN) 8 MG tablet Take 1 tablet (8 mg total) by mouth 2 (two) times daily as needed (Nausea or vomiting). (Patient not taking: Reported on 11/03/2022) 30 tablet 1   polyethylene glycol (MIRALAX) 17 g packet Take 17 g by mouth daily. 30 each 2   pomalidomide (POMALYST) 2 MG capsule Take 1 capsule (2 mg total) by mouth daily. 3 weeks on 1 week off 21 capsule 0   pravastatin (PRAVACHOL) 20 MG tablet Take 1 tablet (20 mg total) by mouth daily. 90 tablet 0   pregabalin (LYRICA) 75 MG capsule Take 1 capsule (75 mg total) by mouth 2 (two) times daily. 84 capsule 0   prochlorperazine (COMPAZINE) 10 MG tablet Take 1 tablet (10 mg total) by mouth every 6 (six) hours as needed (Nausea or vomiting). (Patient not taking: Reported on 11/03/2022) 30 tablet 1   tadalafil (CIALIS) 20 MG tablet Take 20 mg by mouth as needed.     tizanidine (ZANAFLEX) 2 MG capsule Take 1 capsule (2 mg total) by mouth 3 (three) times daily. 10 capsule 0   valsartan (DIOVAN) 40 MG tablet Take 1 tablet (40 mg total) by mouth daily. STOP LISINOPRIL 90 tablet 3   vitamin B-12 (CYANOCOBALAMIN) 100 MCG tablet Take 100 mcg by mouth daily.     No current facility-administered medications for this visit.    REVIEW OF SYSTEMS:  10 Point review of Systems was done is negative except as noted above.   PHYSICAL EXAMINATION: VSS  GENERAL:alert, in no acute distress and  comfortable SKIN: no acute rashes, no significant lesions EYES: conjunctiva are pink and non-injected, sclera anicteric OROPHARYNX: MMM, no exudates, no oropharyngeal erythema or ulceration NECK: supple, no JVD LYMPH:  no palpable lymphadenopathy in the cervical, axillary or inguinal regions LUNGS: clear to auscultation b/l with normal respiratory effort HEART: regular rate & rhythm ABDOMEN:  normoactive bowel sounds , non tender, not distended. Extremity: no pedal edema PSYCH: alert & oriented x 3 with fluent speech NEURO: no focal motor/sensory deficits   LABORATORY DATA:  I have reviewed the data as listed  .    Latest Ref Rng & Units 12/06/2022    8:16 AM 11/22/2022    9:31 AM 11/08/2022    9:26 AM  CBC  WBC 4.0 - 10.5 K/uL 3.6  3.5  4.5   Hemoglobin 13.0 - 17.0 g/dL 16.1  9.7  09.6   Hematocrit 39.0 - 52.0 % 29.6  28.7  29.7   Platelets 150 - 400 K/uL 268  182  284       Latest Ref Rng & Units 12/06/2022    8:16 AM 11/22/2022    9:31 AM 11/08/2022    9:26 AM  CMP  Glucose 70 - 99 mg/dL 045  409  811   BUN 8 - 23 mg/dL 16  19  17    Creatinine 0.61 - 1.24 mg/dL 9.14  7.82  9.56   Sodium 135 - 145 mmol/L 139  138  137   Potassium 3.5 - 5.1 mmol/L 3.4  3.8  3.9   Chloride 98 - 111 mmol/L 107  109  108   CO2 22 - 32 mmol/L 24  23  23    Calcium 8.9 - 10.3 mg/dL 9.9  9.4  9.2   Total Protein 6.5 - 8.1 g/dL 6.5  6.1  6.4   Total Bilirubin 0.3 - 1.2 mg/dL 0.7  0.5  0.5   Alkaline Phos 38 - 126 U/L 46  70  62   AST 15 - 41 U/L 18  19  21    ALT 0 - 44 U/L 19  24  27       08/29/17 BM Bx:    08/29/17 Cytogenetics:   03/13/17 Cytogenetics:        PATHOLOGY Surgical Pathology  CASE: [id number]  PATIENT: Robert Sparks  Bone Marrow Report      Clinical History: Multiple myeloma in relapse (HCC) ,(BH)      DIAGNOSIS:   BONE MARROW, ASPIRATE, CLOT, CORE:  -Hypercellular bone marrow for age with plasma cell neoplasm  -See comment   PERIPHERAL BLOOD:   -Mild normocytic-normochromic anemia  -Eosinophilia   COMMENT:   The bone marrow is hypercellular for age with increased number of plasma  cells representing 8% of all cells in the aspirate associated with  numerous small clusters in the clot and biopsy sections. The plasma  cells display lambda light chain restriction consistent with plasma cell  neoplasm.  The background shows trilineage hematopoiesis with generally  nonspecific myeloid changes, likely secondary in nature in this setting.  Correlation with cytogenetic and FISH studies is recommended.      RADIOGRAPHIC STUDIES: I have personally reviewed the radiological images as listed and agreed with the findings in the report. No results found.  ASSESSMENT & PLAN:   77 y.o. male with   1.  Relapsed IgA Lambda Multiple Myeloma ISS Stage I    Active disease was previously diagnosed based on presence of anemia,  kidney insufficiency, and paraproteinemia with significant predominance of lambda light chains as well as significant elevation of IgA. Patient is status post patient was treated with induction bortezomib Revlimid dexamethasone.  Had issues with skin rashes with Revlimid and this was discontinued.  Completed bortezomib dexamethasone induction and achieved complete remission. Was on maintenance Velcade 2 weeks which was then switched to maintenance Ninlaro. Patient had issues with worsening neuropathy which was a combination of Velcade and his diabetes.  Ninlaro was discontinued as well after maintenance of more than 2 years.  2.  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 Bone marrow biopsy shows 8% lambda restricted plasma cells PET CT scan with no hypermetabolic osseous lesions  3.  Hypertension 4.  Diabetes type 2 5 .chronic kidney disease due to hypertension and diabetes. 6.  Peripheral neuropathy related to diabetes and previous  myeloma treatments.  PLAN:  -Discussed lab results on 12/06/2022 in detail with patient. CBC showed WBC of 3.6K, hemoglobin of 10.1, and platelets of 268K. -mild stable anemia -CMP shows kidney levels improving -discussed result of 10/19/2022 MRI Lumbar spine which showed herniated disc -discussed concern of pinched nerve in neck with pain radiating down the arm -patient is scheduled to be seen by orthopedics on December 09 2022 -will order MRI of neck/shoulder -Patient is tolerating his treatment well without any toxicities.   -No new focal symptoms related to multiple myeloma. -We will continue treatment DPd without any dose modifications.  -no progression of myeloma based on labs -no new symptoms with IV medication  -No toxicities with Daratumumab and Pomalidomide.  FOLLOW-UP: MRI C spine and MRI rt shoulder in 2-3 days Phone visit in 1 week with Dr Candise Che Continue Dara per integrated scheduling  The total time spent in the appointment was 31 minutes* .  All of the patient's questions were answered with apparent satisfaction. The patient knows to call the clinic with any problems, questions or concerns.   Wyvonnia Lora MD MS AAHIVMS [facility] Hematology/Oncology Physician [facility]  .*Total Encounter Time as defined by the Centers for Medicare and Medicaid Services includes, in addition to the face-to-face time of a patient visit (documented in the note above) non-face-to-face time: obtaining and reviewing outside history, ordering and reviewing medications, tests or procedures, care coordination (communications with other health care professionals or caregivers) and documentation in the medical record.    I,Mitra Faeizi,acting as a Neurosurgeon for Wyvonnia Lora, MD.,have documented all relevant documentation on the behalf of Wyvonnia Lora, MD,as directed by  Wyvonnia Lora, MD while in the presence of Wyvonnia Lora, MD.  .I have reviewed the above documentation for accuracy and  completeness, and I agree with the above. Johney Maine MD

## 2022-12-08 ENCOUNTER — Other Ambulatory Visit: Payer: Self-pay

## 2022-12-08 ENCOUNTER — Ambulatory Visit (HOSPITAL_COMMUNITY)
Admission: RE | Admit: 2022-12-08 | Discharge: 2022-12-08 | Disposition: A | Discharge status: Home / Self Care | Payer: 59 | Source: Ambulatory Visit | Attending: Hematology | Admitting: Hematology

## 2022-12-08 ENCOUNTER — Other Ambulatory Visit: Payer: Self-pay | Admitting: Internal Medicine

## 2022-12-08 DIAGNOSIS — R29898 Other symptoms and signs involving the musculoskeletal system: Secondary | ICD-10-CM

## 2022-12-08 DIAGNOSIS — I10 Essential (primary) hypertension: Secondary | ICD-10-CM

## 2022-12-08 DIAGNOSIS — M75121 Complete rotator cuff tear or rupture of right shoulder, not specified as traumatic: Secondary | ICD-10-CM | POA: Diagnosis not present

## 2022-12-08 DIAGNOSIS — M542 Cervicalgia: Secondary | ICD-10-CM | POA: Diagnosis not present

## 2022-12-08 MED ORDER — AMLODIPINE BESYLATE 10 MG PO TABS
10.0000 mg | ORAL_TABLET | Freq: Every day | ORAL | 0 refills | Status: DC
Start: 2022-12-08 — End: 2023-01-09
  Filled 2022-12-08: qty 90, 90d supply, fill #0

## 2022-12-10 ENCOUNTER — Ambulatory Visit (HOSPITAL_COMMUNITY): Admission: RE | Admit: 2022-12-10 | Payer: 59 | Source: Ambulatory Visit

## 2022-12-11 ENCOUNTER — Ambulatory Visit (INDEPENDENT_AMBULATORY_CARE_PROVIDER_SITE_OTHER): Payer: 59 | Admitting: Podiatry

## 2022-12-11 ENCOUNTER — Other Ambulatory Visit: Payer: Self-pay

## 2022-12-11 ENCOUNTER — Other Ambulatory Visit (HOSPITAL_BASED_OUTPATIENT_CLINIC_OR_DEPARTMENT_OTHER): Payer: Self-pay

## 2022-12-11 ENCOUNTER — Encounter: Payer: Self-pay | Admitting: Podiatry

## 2022-12-11 DIAGNOSIS — B351 Tinea unguium: Secondary | ICD-10-CM

## 2022-12-11 DIAGNOSIS — M79674 Pain in right toe(s): Secondary | ICD-10-CM

## 2022-12-11 DIAGNOSIS — M79675 Pain in left toe(s): Secondary | ICD-10-CM

## 2022-12-11 DIAGNOSIS — E1142 Type 2 diabetes mellitus with diabetic polyneuropathy: Secondary | ICD-10-CM

## 2022-12-11 DIAGNOSIS — L84 Corns and callosities: Secondary | ICD-10-CM | POA: Diagnosis not present

## 2022-12-11 NOTE — Progress Notes (Signed)
This patient presents to the office with chief complaint of long thick painful nails.  Patient says the nails are painful walking and wearing shoes.  This patient is unable to self treat.  This patient is unable to trim his nails since he is unable to reach his nails.  She presents to the office for preventative foot care services.  General Appearance  Alert, conversant and in no acute stress.  Vascular  Dorsalis pedis and posterior tibial  pulses are palpable  bilaterally.  Capillary return is within normal limits  bilaterally. Temperature is within normal limits  bilaterally.  Neurologic  Senn-Weinstein monofilament wire test within normal limits  bilaterally. Muscle power within normal limits bilaterally.  Nails Thick disfigured discolored nails with subungual debris  from hallux to fifth toes bilaterally. No evidence of bacterial infection or drainage bilaterally.  Orthopedic  No limitations of motion  feet .  No crepitus or effusions noted.  No bony pathology or digital deformities noted.  Skin  normotropic skin with no porokeratosis noted bilaterally.  No signs of infections or ulcers noted.   Callus plantar right hallux.  Onychomycosis  Nails  B/L.  Pain in right toes  Pain in left toes  Callus right hallux.  Debridement of nails both feet followed trimming the nails with dremel tool.  Debride callus with # 15 blade and dremel tool.  RTC 3 months.   Helane Gunther DPM

## 2022-12-12 ENCOUNTER — Encounter: Payer: Self-pay | Admitting: Hematology

## 2022-12-14 ENCOUNTER — Ambulatory Visit: Payer: 59 | Admitting: Podiatry

## 2022-12-14 ENCOUNTER — Telehealth: Payer: Self-pay | Admitting: Hematology

## 2022-12-15 ENCOUNTER — Ambulatory Visit: Payer: 59 | Admitting: Podiatry

## 2022-12-18 MED FILL — Dexamethasone Sodium Phosphate Inj 100 MG/10ML: INTRAMUSCULAR | Qty: 2 | Status: AC

## 2022-12-19 ENCOUNTER — Ambulatory Visit: Payer: 59 | Admitting: Hematology

## 2022-12-19 ENCOUNTER — Other Ambulatory Visit: Payer: 59

## 2022-12-19 ENCOUNTER — Inpatient Hospital Stay: Payer: 59 | Attending: Hematology

## 2022-12-19 ENCOUNTER — Ambulatory Visit: Payer: 59

## 2022-12-19 ENCOUNTER — Other Ambulatory Visit: Payer: Self-pay

## 2022-12-19 ENCOUNTER — Inpatient Hospital Stay: Payer: 59

## 2022-12-19 VITALS — BP 127/72 | HR 77 | Temp 97.9°F | Resp 18 | Wt 168.5 lb

## 2022-12-19 DIAGNOSIS — Z5112 Encounter for antineoplastic immunotherapy: Secondary | ICD-10-CM | POA: Insufficient documentation

## 2022-12-19 DIAGNOSIS — C9002 Multiple myeloma in relapse: Secondary | ICD-10-CM

## 2022-12-19 DIAGNOSIS — Z7189 Other specified counseling: Secondary | ICD-10-CM

## 2022-12-19 LAB — CBC WITH DIFFERENTIAL (CANCER CENTER ONLY)
Abs Immature Granulocytes: 0.02 10*3/uL (ref 0.00–0.07)
Basophils Absolute: 0.1 10*3/uL (ref 0.0–0.1)
Basophils Relative: 2 %
Eosinophils Absolute: 0.3 10*3/uL (ref 0.0–0.5)
Eosinophils Relative: 6 %
HCT: 28.5 % — ABNORMAL LOW (ref 39.0–52.0)
Hemoglobin: 9.7 g/dL — ABNORMAL LOW (ref 13.0–17.0)
Immature Granulocytes: 1 %
Lymphocytes Relative: 31 %
Lymphs Abs: 1.2 10*3/uL (ref 0.7–4.0)
MCH: 33 pg (ref 26.0–34.0)
MCHC: 34 g/dL (ref 30.0–36.0)
MCV: 96.9 fL (ref 80.0–100.0)
Monocytes Absolute: 0.6 10*3/uL (ref 0.1–1.0)
Monocytes Relative: 14 %
Neutro Abs: 1.9 10*3/uL (ref 1.7–7.7)
Neutrophils Relative %: 46 %
Platelet Count: 253 10*3/uL (ref 150–400)
RBC: 2.94 MIL/uL — ABNORMAL LOW (ref 4.22–5.81)
RDW: 14.6 % (ref 11.5–15.5)
WBC Count: 4 10*3/uL (ref 4.0–10.5)
nRBC: 0 % (ref 0.0–0.2)

## 2022-12-19 LAB — COMPREHENSIVE METABOLIC PANEL
ALT: 13 U/L (ref 0–44)
AST: 15 U/L (ref 15–41)
Albumin: 4 g/dL (ref 3.5–5.0)
Alkaline Phosphatase: 59 U/L (ref 38–126)
Anion gap: 8 (ref 5–15)
BUN: 16 mg/dL (ref 8–23)
CO2: 22 mmol/L (ref 22–32)
Calcium: 9.5 mg/dL (ref 8.9–10.3)
Chloride: 110 mmol/L (ref 98–111)
Creatinine, Ser: 1.58 mg/dL — ABNORMAL HIGH (ref 0.61–1.24)
GFR, Estimated: 46 mL/min — ABNORMAL LOW (ref >60)
Glucose, Bld: 185 mg/dL — ABNORMAL HIGH (ref 70–99)
Potassium: 3.5 mmol/L (ref 3.5–5.1)
Sodium: 140 mmol/L (ref 135–145)
Total Bilirubin: 0.6 mg/dL (ref 0.3–1.2)
Total Protein: 6.3 g/dL — ABNORMAL LOW (ref 6.5–8.1)

## 2022-12-19 MED ORDER — FAMOTIDINE IN NACL 20-0.9 MG/50ML-% IV SOLN
20.0000 mg | Freq: Once | INTRAVENOUS | Status: AC
  Administered 2022-12-19: 20 mg via INTRAVENOUS
  Filled 2022-12-19: qty 50

## 2022-12-19 MED ORDER — DIPHENHYDRAMINE HCL 25 MG PO CAPS
50.0000 mg | ORAL_CAPSULE | Freq: Once | ORAL | Status: AC
  Administered 2022-12-19: 50 mg via ORAL
  Filled 2022-12-19: qty 2

## 2022-12-19 MED ORDER — ACETAMINOPHEN 325 MG PO TABS
650.0000 mg | ORAL_TABLET | Freq: Once | ORAL | Status: AC
  Administered 2022-12-19: 650 mg via ORAL
  Filled 2022-12-19: qty 2

## 2022-12-19 MED ORDER — SODIUM CHLORIDE 0.9 % IV SOLN: Freq: Once | INTRAVENOUS | Status: AC

## 2022-12-19 MED ORDER — SODIUM CHLORIDE 0.9 % IV SOLN
16.0000 mg/kg | Freq: Once | INTRAVENOUS | Status: AC
  Administered 2022-12-19: 1200 mg via INTRAVENOUS
  Filled 2022-12-19: qty 60

## 2022-12-19 MED ORDER — POMALIDOMIDE 2 MG PO CAPS
2.0000 mg | ORAL_CAPSULE | Freq: Every day | ORAL | 0 refills | Status: DC
Start: 2022-12-19 — End: 2023-01-17

## 2022-12-19 NOTE — Progress Notes (Signed)
Pt reports taking PO dexamethasone at home at approximately 7am.

## 2022-12-19 NOTE — Patient Instructions (Signed)
[facility]  Discharge Instructions: Thank you for choosing [facility] to provide your oncology and hematology care.   If you have a lab appointment with the Cancer Center, please go directly to the Cancer Center and check in at the registration area.   Wear comfortable clothing and clothing appropriate for easy access to any Portacath or PICC line.   We strive to give you quality time with your provider. You may need to reschedule your appointment if you arrive late (15 or more minutes).  Arriving late affects you and other patients whose appointments are after yours.  Also, if you miss three or more appointments without notifying the office, you may be dismissed from the clinic at the provider's discretion.      For prescription refill requests, have your pharmacy contact our office and allow 72 hours for refills to be completed.    Today you received the following chemotherapy and/or immunotherapy agents: Darzalex    To help prevent nausea and vomiting after your treatment, we encourage you to take your nausea medication as directed.  BELOW ARE SYMPTOMS THAT SHOULD BE REPORTED IMMEDIATELY: *FEVER GREATER THAN 100.4 F (38 C) OR HIGHER *CHILLS OR SWEATING *NAUSEA AND VOMITING THAT IS NOT CONTROLLED WITH YOUR NAUSEA MEDICATION *UNUSUAL SHORTNESS OF BREATH *UNUSUAL BRUISING OR BLEEDING *URINARY PROBLEMS (pain or burning when urinating, or frequent urination) *BOWEL PROBLEMS (unusual diarrhea, constipation, pain near the anus) TENDERNESS IN MOUTH AND THROAT WITH OR WITHOUT PRESENCE OF ULCERS (sore throat, sores in mouth, or a toothache) UNUSUAL RASH, SWELLING OR PAIN  UNUSUAL VAGINAL DISCHARGE OR ITCHING   Items with * indicate a potential emergency and should be followed up as soon as possible or go to the Emergency Department if any problems should occur.  Please show the CHEMOTHERAPY ALERT CARD or IMMUNOTHERAPY ALERT CARD at check-in  to the Emergency Department and triage nurse.  Should you have questions after your visit or need to cancel or reschedule your appointment, please contact [facility]  Dept: [phone]  and follow the prompts.  Office hours are 8:00 a.m. to 4:30 p.m. Monday - Friday. Please note that voicemails left after 4:00 p.m. may not be returned until the following business day.  We are closed weekends and major holidays. You have access to a nurse at all times for urgent questions. Please call the main number to the clinic Dept: [phone] and follow the prompts.   For any non-urgent questions, you may also contact your provider using MyChart. We now offer e-Visits for anyone 18 and older to request care online for non-urgent symptoms. For details visit [email or web address].   Also download the MyChart app! Go to the app store, search "MyChart", open the app, select [place], and log in with your MyChart username and password.   

## 2022-12-22 ENCOUNTER — Other Ambulatory Visit: Payer: Self-pay

## 2022-12-22 ENCOUNTER — Other Ambulatory Visit: Payer: Self-pay | Admitting: Internal Medicine

## 2022-12-25 ENCOUNTER — Other Ambulatory Visit: Payer: Self-pay

## 2022-12-25 ENCOUNTER — Other Ambulatory Visit: Payer: Self-pay | Admitting: Internal Medicine

## 2022-12-25 DIAGNOSIS — E1142 Type 2 diabetes mellitus with diabetic polyneuropathy: Secondary | ICD-10-CM

## 2022-12-25 LAB — MULTIPLE MYELOMA PANEL, SERUM
Albumin SerPl Elph-Mcnc: 3.5 g/dL (ref 2.9–4.4)
Albumin/Glob SerPl: 1.6 (ref 0.7–1.7)
Alpha 1: 0.2 g/dL (ref 0.0–0.4)
Alpha2 Glob SerPl Elph-Mcnc: 0.8 g/dL (ref 0.4–1.0)
B-Globulin SerPl Elph-Mcnc: 0.8 g/dL (ref 0.7–1.3)
Gamma Glob SerPl Elph-Mcnc: 0.4 g/dL (ref 0.4–1.8)
Globulin, Total: 2.2 g/dL (ref 2.2–3.9)
IgA: 93 mg/dL (ref 61–437)
IgG (Immunoglobin G), Serum: 554 mg/dL — ABNORMAL LOW (ref 603–1613)
IgM (Immunoglobulin M), Srm: 26 mg/dL (ref 15–143)
Total Protein ELP: 5.7 g/dL — ABNORMAL LOW (ref 6.0–8.5)

## 2022-12-25 MED ORDER — ACCU-CHEK GUIDE VI STRP
ORAL_STRIP | 0 refills | Status: AC
Start: 2022-12-25 — End: ?
  Filled 2022-12-25: qty 100, 33d supply, fill #0

## 2022-12-26 ENCOUNTER — Other Ambulatory Visit: Payer: Self-pay

## 2022-12-26 DIAGNOSIS — N189 Chronic kidney disease, unspecified: Secondary | ICD-10-CM | POA: Diagnosis not present

## 2022-12-26 DIAGNOSIS — N1831 Chronic kidney disease, stage 3a: Secondary | ICD-10-CM | POA: Diagnosis not present

## 2022-12-27 ENCOUNTER — Encounter: Payer: Self-pay | Admitting: Hematology

## 2022-12-27 ENCOUNTER — Other Ambulatory Visit: Payer: Self-pay

## 2022-12-27 NOTE — Progress Notes (Signed)
This encounter was created in error - please disregard.

## 2023-01-01 DIAGNOSIS — I129 Hypertensive chronic kidney disease with stage 1 through stage 4 chronic kidney disease, or unspecified chronic kidney disease: Secondary | ICD-10-CM | POA: Diagnosis not present

## 2023-01-01 DIAGNOSIS — N2581 Secondary hyperparathyroidism of renal origin: Secondary | ICD-10-CM | POA: Diagnosis not present

## 2023-01-01 DIAGNOSIS — N1831 Chronic kidney disease, stage 3a: Secondary | ICD-10-CM | POA: Diagnosis not present

## 2023-01-01 DIAGNOSIS — E1122 Type 2 diabetes mellitus with diabetic chronic kidney disease: Secondary | ICD-10-CM | POA: Diagnosis not present

## 2023-01-01 DIAGNOSIS — C9000 Multiple myeloma not having achieved remission: Secondary | ICD-10-CM | POA: Diagnosis not present

## 2023-01-01 DIAGNOSIS — D631 Anemia in chronic kidney disease: Secondary | ICD-10-CM | POA: Diagnosis not present

## 2023-01-02 MED FILL — Dexamethasone Sodium Phosphate Inj 100 MG/10ML: INTRAMUSCULAR | Qty: 2 | Status: AC

## 2023-01-03 ENCOUNTER — Inpatient Hospital Stay: Payer: 59

## 2023-01-03 ENCOUNTER — Other Ambulatory Visit: Payer: Self-pay

## 2023-01-03 ENCOUNTER — Other Ambulatory Visit: Payer: 59

## 2023-01-03 ENCOUNTER — Inpatient Hospital Stay (HOSPITAL_BASED_OUTPATIENT_CLINIC_OR_DEPARTMENT_OTHER): Payer: 59 | Admitting: Hematology

## 2023-01-03 ENCOUNTER — Ambulatory Visit: Payer: 59

## 2023-01-03 VITALS — BP 129/61 | HR 71 | Temp 98.0°F | Resp 17

## 2023-01-03 VITALS — BP 116/64 | HR 66 | Temp 98.2°F | Resp 18 | Wt 167.3 lb

## 2023-01-03 DIAGNOSIS — Z7189 Other specified counseling: Secondary | ICD-10-CM

## 2023-01-03 DIAGNOSIS — Z5112 Encounter for antineoplastic immunotherapy: Secondary | ICD-10-CM | POA: Diagnosis not present

## 2023-01-03 DIAGNOSIS — C9002 Multiple myeloma in relapse: Secondary | ICD-10-CM

## 2023-01-03 LAB — COMPREHENSIVE METABOLIC PANEL
ALT: 12 U/L (ref 0–44)
AST: 16 U/L (ref 15–41)
Albumin: 3.9 g/dL (ref 3.5–5.0)
Alkaline Phosphatase: 50 U/L (ref 38–126)
Anion gap: 9 (ref 5–15)
BUN: 13 mg/dL (ref 8–23)
CO2: 24 mmol/L (ref 22–32)
Calcium: 9.5 mg/dL (ref 8.9–10.3)
Chloride: 107 mmol/L (ref 98–111)
Creatinine, Ser: 1.28 mg/dL — ABNORMAL HIGH (ref 0.61–1.24)
GFR, Estimated: 59 mL/min — ABNORMAL LOW (ref >60)
Glucose, Bld: 193 mg/dL — ABNORMAL HIGH (ref 70–99)
Potassium: 3.1 mmol/L — ABNORMAL LOW (ref 3.5–5.1)
Sodium: 140 mmol/L (ref 135–145)
Total Bilirubin: 0.6 mg/dL (ref 0.3–1.2)
Total Protein: 6.5 g/dL (ref 6.5–8.1)

## 2023-01-03 LAB — CBC WITH DIFFERENTIAL (CANCER CENTER ONLY)
Abs Immature Granulocytes: 0.01 10*3/uL (ref 0.00–0.07)
Basophils Absolute: 0.1 10*3/uL (ref 0.0–0.1)
Basophils Relative: 2 %
Eosinophils Absolute: 0.2 10*3/uL (ref 0.0–0.5)
Eosinophils Relative: 6 %
HCT: 29.1 % — ABNORMAL LOW (ref 39.0–52.0)
Hemoglobin: 9.9 g/dL — ABNORMAL LOW (ref 13.0–17.0)
Immature Granulocytes: 0 %
Lymphocytes Relative: 34 %
Lymphs Abs: 1.2 10*3/uL (ref 0.7–4.0)
MCH: 33.1 pg (ref 26.0–34.0)
MCHC: 34 g/dL (ref 30.0–36.0)
MCV: 97.3 fL (ref 80.0–100.0)
Monocytes Absolute: 0.6 10*3/uL (ref 0.1–1.0)
Monocytes Relative: 17 %
Neutro Abs: 1.4 10*3/uL — ABNORMAL LOW (ref 1.7–7.7)
Neutrophils Relative %: 41 %
Platelet Count: 262 10*3/uL (ref 150–400)
RBC: 2.99 MIL/uL — ABNORMAL LOW (ref 4.22–5.81)
RDW: 15.1 % (ref 11.5–15.5)
WBC Count: 3.6 10*3/uL — ABNORMAL LOW (ref 4.0–10.5)
nRBC: 0 % (ref 0.0–0.2)

## 2023-01-03 MED ORDER — FAMOTIDINE IN NACL 20-0.9 MG/50ML-% IV SOLN
20.0000 mg | Freq: Once | INTRAVENOUS | Status: AC
  Administered 2023-01-03: 20 mg via INTRAVENOUS
  Filled 2023-01-03: qty 50

## 2023-01-03 MED ORDER — POTASSIUM CHLORIDE CRYS ER 20 MEQ PO TBCR
20.0000 meq | EXTENDED_RELEASE_TABLET | Freq: Every day | ORAL | 0 refills | Status: DC
  Filled 2023-01-03: qty 15, 15d supply, fill #0

## 2023-01-03 MED ORDER — SODIUM CHLORIDE 0.9 % IV SOLN
16.0000 mg/kg | Freq: Once | INTRAVENOUS | Status: AC
  Administered 2023-01-03: 1200 mg via INTRAVENOUS
  Filled 2023-01-03: qty 60

## 2023-01-03 MED ORDER — SODIUM CHLORIDE 0.9 % IV SOLN: Freq: Once | INTRAVENOUS | Status: AC

## 2023-01-03 MED ORDER — DIPHENHYDRAMINE HCL 25 MG PO CAPS
50.0000 mg | ORAL_CAPSULE | Freq: Once | ORAL | Status: AC
  Administered 2023-01-03: 50 mg via ORAL
  Filled 2023-01-03: qty 2

## 2023-01-03 MED ORDER — ACETAMINOPHEN 325 MG PO TABS
650.0000 mg | ORAL_TABLET | Freq: Once | ORAL | Status: AC
  Administered 2023-01-03: 650 mg via ORAL
  Filled 2023-01-03: qty 2

## 2023-01-03 NOTE — Progress Notes (Signed)
Patient states he took his dexamethasone at home today.

## 2023-01-03 NOTE — Progress Notes (Signed)
HEMATOLOGY/ONCOLOGY CLINIC NOTE  Date of Service: 01/03/23   Patient Care Team: Marcine Matar, MD as PCP - General (Internal Medicine)  CHIEF COMPLAINTS/PURPOSE OF CONSULTATION:  Follow-up for continued evaluation and management of relapsed multiple myeloma  Oncologic History:  77 y.o. male with diagnosis of IgA lambda active multiple myeloma, ISS Stage I. Active disease diagnosed based on presence of anemia, kidney insufficiency, and paraproteinemia with significant predominance of lambda light chains as well as significant elevation of IgA. Bone marrow biopsy confirmed presence of atypical monoclonal plasma cell process in the bone marrow comprising at least 7% of the cellularity. Based on the findings, patient was started on treatment with lenalidomide, bortezomib, and low-dose dexamethasone based on the anticipated tolerance by the patient. Lenalidomide was started at 10 mg daily based on creatinine clearance of 42, dexamethasone dose was reduced to 20 mg weekly based on patient's age. Treatment was complicated by rapid development of cutaneous rash attributed to lenalidomide, inaddition to decreased renal function and now patient has persistent severe anemia. Side-effects were attributed to Lenalidomide and lenalidomide was discontinued with subsequent recovery.  Patient has been receiving bortezomib weekly with low-dose dexamethasone in 4-day cycles.   Patient has completed induction systemic therapy for his disease with repeat bone marrow biopsy confirming complete response including no evidence of minimal residual disease by cytogenetics or FISH.  HISTORY OF PRESENTING ILLNESS:  Please see previous note for details of initial presentation.  [name] is a 77 y.o. male is here for continued evaluation and management of his relapsed multiple myeloma. Patient was last seen by me on 12/06/2022 and complained of worsened right shoulder pain radiating to  right arm, mild generalized neck pain, tightness in the back and hip, and night sweats.   Today, he complains of shoulder, neck, and back pain. His shoulder pain has been present for a while. Patient's ability to lift his right arm is limited. He denies any recent falls. He reports arthritis in his cervical spine.   He is tolerating his Daratumumab and Pomalidomide treatment well with no major toxicity issues. He denies any swallowing issues. He denies any diarrhea and does drink water regularly.   MEDICAL HISTORY:  Past Medical History:  Diagnosis Date   Anemia    Arthritis    shoulders,feet    Cataract    per eye dr -has appt 5-11   CKD (chronic kidney disease), stage III (HCC)    Elevated PSA    History of ketoacidosis    03-29-2015    History of sepsis    07-25-2013  non-compliant w/ medication   Hyperlipidemia    Hypertension    Nocturia    Peripheral neuropathy    Prostate cancer (HCC)    Type 2 diabetes mellitus with insulin therapy [facility])     SURGICAL HISTORY: Past Surgical History:  Procedure Laterality Date   CIRCUMCISION  1990's   PROSTATE BIOPSY N/A 12/21/2015   Procedure: BIOPSY TRANSRECTAL ULTRASONIC PROSTATE (TUBP);  Surgeon: Jerilee Field, MD;  Location: [facility];  Service: Urology;  Laterality: N/A;    SOCIAL HISTORY: Social History   Socioeconomic History   Marital status: Single    Spouse name: Not on file   Number of children: Not on file   Years of education: Not on file   Highest education level: Not on file  Occupational History   Not on file  Tobacco Use   Smoking status: Every Day  Packs/day: 0.50    Years: 50.00    Additional pack years: 0.00    Total pack years: 25.00    Types: Cigarettes   Smokeless tobacco: Never   Tobacco comments:    1 ppwk-4-5 a day   Vaping Use   Vaping Use: Never used  Substance and Sexual Activity   Alcohol use: Yes    Alcohol/week: 1.0 standard drink of alcohol    Types: 1 Cans of  beer per week    Comment: 1 every day-quit couple weeks ago   Drug use: No   Sexual activity: Not Currently  Other Topics Concern   Not on file  Social History Narrative   Not on file   Social Determinants of Health   Financial Resource Strain: Low Risk  (11/03/2022)   Overall Financial Resource Strain (CARDIA)    Difficulty of Paying Living Expenses: Not hard at all  Food Insecurity: No Food Insecurity (09/15/2022)   Hunger Vital Sign    Worried About Running Out of Food in the Last Year: Never true    Ran Out of Food in the Last Year: Never true  Transportation Needs: No Transportation Needs (09/15/2022)   PRAPARE - Administrator, Civil Service (Medical): No    Lack of Transportation (Non-Medical): No  Physical Activity: Insufficiently Active (11/03/2022)   Exercise Vital Sign    Days of Exercise per Week: 1 day    Minutes of Exercise per Session: 20 min  Stress: No Stress Concern Present (11/03/2022)   [employer] of Occupational Health - Occupational Stress Questionnaire    Feeling of Stress : Not at all  Social Connections: Socially Isolated (11/03/2022)   Social Connection and Isolation Panel [NHANES]    Frequency of Communication with Friends and Family: Once a week    Frequency of Social Gatherings with Friends and Family: Once a week    Attends Religious Services: 1 to 4 times per year    Active Member of [employer] or Organizations: No    Attends Engineer, structural: Never    Marital Status: Never married  Catering manager Violence: Not on file    FAMILY HISTORY: Family History  Problem Relation Age of Onset   Kidney disease Mother    Cancer Neg Hx    Colon cancer Neg Hx    Colon polyps Neg Hx    Esophageal cancer Neg Hx    Rectal cancer Neg Hx    Stomach cancer Neg Hx     ALLERGIES:  is allergic to other and lisinopril.  MEDICATIONS:  Current Outpatient Medications  Medication Sig Dispense Refill   Accu-Chek Softclix Lancets lancets  Use to test blood sugar 3 times daily. 100 each 3   acetaminophen (TYLENOL) 500 MG tablet Take 2 tablets (1,000 mg total) by mouth every 6 (six) hours as needed. 30 tablet 0   acyclovir (ZOVIRAX) 400 MG tablet Take 1 tablet (400 mg total) by mouth 2 (two) times daily. 60 tablet 11   amLODipine (NORVASC) 10 MG tablet TAKE 1 TABLET (10 MG TOTAL) BY MOUTH DAILY. 90 tablet 0   aspirin EC 81 MG tablet Take 81 mg by mouth daily. Swallow whole.     b complex vitamins capsule Take 1 capsule by mouth daily. 30 capsule 5   Blood Glucose Monitoring Suppl (ACCU-CHEK GUIDE) w/Device KIT Use to test blood sugar three times daily 1 kit 0   Continuous Blood Gluc Receiver (FREESTYLE LIBRE READER) DEVI Use to check blood  sugar 3x daily. E11.42 1 each 0   Continuous Blood Gluc Sensor (FREESTYLE LIBRE SENSOR SYSTEM) MISC Use to check blood sugar 3 times daily. Change sensor every 2 weeks. 2 each 12   dexamethasone (DECADRON) 4 MG tablet 20mg  (5 tabs) Take the day after each dose of daratumumab. Take with breakfast 20 tablet 11   diclofenac Sodium (VOLTAREN) 1 % GEL Apply 4 g topically 4 (four) times daily. 150 g 5   donepezil (ARICEPT) 5 MG tablet TAKE 1 TABLET (5 MG TOTAL) BY MOUTH AT BEDTIME. FOR MEMORY ISSUES 30 tablet 5   ferrous sulfate 325 (65 FE) MG tablet Take 325 mg by mouth daily.     furosemide (LASIX) 20 MG tablet Take 1 tablet (20 mg total) by mouth daily in the morning for 7 days (until 09/08/22). Save remaining tablets. 30 tablet 0   glucose blood (ACCU-CHEK GUIDE) test strip Use to test blood sugar 3 times daily. 100 each 2   hydrOXYzine (ATARAX) 25 MG tablet TAKE 1 TABLET (25 MG TOTAL) BY MOUTH 3 (THREE) TIMES DAILY AS NEEDED FOR ITCHING (RASH). 30 tablet 0   Insulin Lispro Prot & Lispro (HUMALOG MIX 75/25 KWIKPEN) (75-25) 100 UNIT/ML Kwikpen Inject 8 units subcutaneously with breakfast and dinner. Increase to 10 units for 3-5 days twice a month after dexamethasone. 15 mL 11   Insulin Pen Needle  (TECHLITE PEN NEEDLES) 32G X 4 MM MISC USE AS DIRECTED TWICE DAILY WITH INSULIN 100 each 0   ketoconazole (NIZORAL) 2 % cream Apply to skin of feet twice daily 60 g 2   Miconazole Nitrate (ATHLETES FOOT POWDER SPRAY) 2 % AERP Spray between toes and under toes once daily. (Patient not taking: Reported on 11/03/2022) 130 g 4   Multiple Vitamin (MULTIVITAMIN WITH MINERALS) TABS tablet Take 1 tablet by mouth daily. 60 tablet 2   ondansetron (ZOFRAN) 8 MG tablet Take 1 tablet (8 mg total) by mouth 2 (two) times daily as needed (Nausea or vomiting). (Patient not taking: Reported on 11/03/2022) 30 tablet 1   polyethylene glycol (MIRALAX) 17 g packet Take 17 g by mouth daily. 30 each 2   pomalidomide (POMALYST) 2 MG capsule Take 1 capsule (2 mg total) by mouth daily. 3 weeks on 1 week off 21 capsule 0   pravastatin (PRAVACHOL) 20 MG tablet Take 1 tablet (20 mg total) by mouth daily. 90 tablet 0   pregabalin (LYRICA) 75 MG capsule Take 1 capsule (75 mg total) by mouth 2 (two) times daily. 84 capsule 0   prochlorperazine (COMPAZINE) 10 MG tablet Take 1 tablet (10 mg total) by mouth every 6 (six) hours as needed (Nausea or vomiting). (Patient not taking: Reported on 11/03/2022) 30 tablet 1   tadalafil (CIALIS) 20 MG tablet Take 20 mg by mouth as needed.     tizanidine (ZANAFLEX) 2 MG capsule Take 1 capsule (2 mg total) by mouth 3 (three) times daily. 10 capsule 0   valsartan (DIOVAN) 40 MG tablet Take 1 tablet (40 mg total) by mouth daily. STOP LISINOPRIL 90 tablet 3   vitamin B-12 (CYANOCOBALAMIN) 100 MCG tablet Take 100 mcg by mouth daily.     No current facility-administered medications for this visit.    REVIEW OF SYSTEMS:  10 Point review of Systems was done is negative except as noted above.   PHYSICAL EXAMINATION: .BP 116/64   Pulse 66   Temp 98.2 F (36.8 C)   Resp 18   Wt 167 lb 4.8 oz (75.9  kg)   SpO2 100%   BMI 26.20 kg/m  GENERAL:alert, in no acute distress and comfortable SKIN: no  acute rashes, no significant lesions EYES: conjunctiva are pink and non-injected, sclera anicteric OROPHARYNX: MMM, no exudates, no oropharyngeal erythema or ulceration NECK: supple, no JVD LYMPH:  no palpable lymphadenopathy in the cervical, axillary or inguinal regions LUNGS: clear to auscultation b/l with normal respiratory effort HEART: regular rate & rhythm ABDOMEN:  normoactive bowel sounds , non tender, not distended. Extremity: no pedal edema PSYCH: alert & oriented x 3 with fluent speech NEURO: no focal motor/sensory deficits   LABORATORY DATA:  I have reviewed the data as listed  .    Latest Ref Rng & Units 01/03/2023    9:17 AM 12/19/2022    7:57 AM 12/06/2022    8:16 AM  CBC  WBC 4.0 - 10.5 K/uL 3.6  4.0  3.6   Hemoglobin 13.0 - 17.0 g/dL 9.9  9.7  40.9   Hematocrit 39.0 - 52.0 % 29.1  28.5  29.6   Platelets 150 - 400 K/uL 262  253  268       Latest Ref Rng & Units 01/03/2023    9:17 AM 12/19/2022    7:57 AM 12/06/2022    8:16 AM  CMP  Glucose 70 - 99 mg/dL 811  914  782   BUN 8 - 23 mg/dL 13  16  16    Creatinine 0.61 - 1.24 mg/dL 9.56  2.13  0.86   Sodium 135 - 145 mmol/L 140  140  139   Potassium 3.5 - 5.1 mmol/L 3.1  3.5  3.4   Chloride 98 - 111 mmol/L 107  110  107   CO2 22 - 32 mmol/L 24  22  24    Calcium 8.9 - 10.3 mg/dL 9.5  9.5  9.9   Total Protein 6.5 - 8.1 g/dL 6.5  6.3  6.5   Total Bilirubin 0.3 - 1.2 mg/dL 0.6  0.6  0.7   Alkaline Phos 38 - 126 U/L 50  59  46   AST 15 - 41 U/L 16  15  18    ALT 0 - 44 U/L 12  13  19       08/29/17 BM Bx:    08/29/17 Cytogenetics:   03/13/17 Cytogenetics:        PATHOLOGY Surgical Pathology  CASE: [id number]  PATIENT: Robert Sparks  Bone Marrow Report      Clinical History: Multiple myeloma in relapse (HCC) ,(BH)      DIAGNOSIS:   BONE MARROW, ASPIRATE, CLOT, CORE:  -Hypercellular bone marrow for age with plasma cell neoplasm  -See comment   PERIPHERAL BLOOD:  -Mild  normocytic-normochromic anemia  -Eosinophilia   COMMENT:   The bone marrow is hypercellular for age with increased number of plasma  cells representing 8% of all cells in the aspirate associated with  numerous small clusters in the clot and biopsy sections. The plasma  cells display lambda light chain restriction consistent with plasma cell  neoplasm.  The background shows trilineage hematopoiesis with generally  nonspecific myeloid changes, likely secondary in nature in this setting.  Correlation with cytogenetic and FISH studies is recommended.      RADIOGRAPHIC STUDIES: I have personally reviewed the radiological images as listed and agreed with the findings in the report. No results found.  ASSESSMENT & PLAN:   77 y.o. male with   1.  Relapsed IgA Lambda Multiple Myeloma ISS Stage I  Active disease was previously diagnosed based on presence of anemia, kidney insufficiency, and paraproteinemia with significant predominance of lambda light chains as well as significant elevation of IgA. Patient is status post patient was treated with induction bortezomib Revlimid dexamethasone.  Had issues with skin rashes with Revlimid and this was discontinued.  Completed bortezomib dexamethasone induction and achieved complete remission. Was on maintenance Velcade 2 weeks which was then switched to maintenance Ninlaro. Patient had issues with worsening neuropathy which was a combination of Velcade and his diabetes.  Ninlaro was discontinued as well after maintenance of more than 2 years.  2.  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 Bone marrow biopsy shows 8% lambda restricted plasma cells PET CT scan with no hypermetabolic osseous lesions  3.  Hypertension 4.  Diabetes type 2 5 .chronic kidney disease due to hypertension and diabetes. 6.  Peripheral neuropathy related to diabetes and previous myeloma  treatments.  PLAN:  -Discussed results of 12/08/2022 MRI which showed:  1. Complete tear of the supraspinatus tendon with 2.2 cm of retraction. 2. Severe tendinosis of the infraspinatus tendon. 3. Complete tear of the proximal long head of the biceps tendon. -Discussed lab results on 01/03/2023 in detail with patient. CBC showed WBC of 3.6K, hemoglobin of 9.9, and platelets of 262K. -stable anemia -last myelom lab from 12/19/2022 showed undetectable M protein -CMP showed hypokalemia with potassium level of 3.1 and improved kidney function  -low potassium levels likely caused by Pomalidomide and Lasix  -will start potassium replacement to optimize levels -advised patient to increase potassium intake in the diet but to ensure not to consume excess amounts due to CKD.  -no progression of myeloma based on labs. -No toxicities with Daratumumab and Pomalidomide.  -No new focal symptoms related to multiple myeloma. -We will continue treatment DPd without any dose modifications.  -continue to drink plenty of water to keep kidneys flushed -advised patient to follow with orthopedics to discuss shoulder and neck pain -would not recommend patient to lift heavy objects at this time.   FOLLOW-UP: Per integrated scheduling.  The total time spent in the appointment was 32 minutes* .  All of the patient's questions were answered with apparent satisfaction. The patient knows to call the clinic with any problems, questions or concerns.   Wyvonnia Lora MD MS AAHIVMS [facility] Hematology/Oncology Physician [facility]  .*Total Encounter Time as defined by the Centers for Medicare and Medicaid Services includes, in addition to the face-to-face time of a patient visit (documented in the note above) non-face-to-face time: obtaining and reviewing outside history, ordering and reviewing medications, tests or procedures, care coordination (communications with other health care professionals or  caregivers) and documentation in the medical record.    I,Mitra Faeizi,acting as a Neurosurgeon for Wyvonnia Lora, MD.,have documented all relevant documentation on the behalf of Wyvonnia Lora, MD,as directed by  Wyvonnia Lora, MD while in the presence of Wyvonnia Lora, MD.  .I have reviewed the above documentation for accuracy and completeness, and I agree with the above. Johney Maine MD

## 2023-01-03 NOTE — Patient Instructions (Signed)
[facility]  Discharge Instructions: Thank you for choosing [facility] to provide your oncology and hematology care.   If you have a lab appointment with the Cancer Center, please go directly to the Cancer Center and check in at the registration area.   Wear comfortable clothing and clothing appropriate for easy access to any Portacath or PICC line.   We strive to give you quality time with your provider. You may need to reschedule your appointment if you arrive late (15 or more minutes).  Arriving late affects you and other patients whose appointments are after yours.  Also, if you miss three or more appointments without notifying the office, you may be dismissed from the clinic at the provider's discretion.      For prescription refill requests, have your pharmacy contact our office and allow 72 hours for refills to be completed.    Today you received the following chemotherapy and/or immunotherapy agents: Darzalex    To help prevent nausea and vomiting after your treatment, we encourage you to take your nausea medication as directed.  BELOW ARE SYMPTOMS THAT SHOULD BE REPORTED IMMEDIATELY: *FEVER GREATER THAN 100.4 F (38 C) OR HIGHER *CHILLS OR SWEATING *NAUSEA AND VOMITING THAT IS NOT CONTROLLED WITH YOUR NAUSEA MEDICATION *UNUSUAL SHORTNESS OF BREATH *UNUSUAL BRUISING OR BLEEDING *URINARY PROBLEMS (pain or burning when urinating, or frequent urination) *BOWEL PROBLEMS (unusual diarrhea, constipation, pain near the anus) TENDERNESS IN MOUTH AND THROAT WITH OR WITHOUT PRESENCE OF ULCERS (sore throat, sores in mouth, or a toothache) UNUSUAL RASH, SWELLING OR PAIN  UNUSUAL VAGINAL DISCHARGE OR ITCHING   Items with * indicate a potential emergency and should be followed up as soon as possible or go to the Emergency Department if any problems should occur.  Please show the CHEMOTHERAPY ALERT CARD or IMMUNOTHERAPY ALERT CARD at check-in  to the Emergency Department and triage nurse.  Should you have questions after your visit or need to cancel or reschedule your appointment, please contact [facility]  Dept: [phone]  and follow the prompts.  Office hours are 8:00 a.m. to 4:30 p.m. Monday - Friday. Please note that voicemails left after 4:00 p.m. may not be returned until the following business day.  We are closed weekends and major holidays. You have access to a nurse at all times for urgent questions. Please call the main number to the clinic Dept: [phone] and follow the prompts.   For any non-urgent questions, you may also contact your provider using MyChart. We now offer e-Visits for anyone 18 and older to request care online for non-urgent symptoms. For details visit [email or web address].   Also download the MyChart app! Go to the app store, search "MyChart", open the app, select [facility], and log in with your MyChart username and password.   

## 2023-01-03 NOTE — Progress Notes (Signed)
Patient seen by Dr. Addison Naegeli are within treatment parameters.  Labs reviewed: and are not all within treatment parameters. Dr Candise Che aware  ANC:  1.4, CR: 1.28  Per physician team, patient is ready for treatment and there are NO modifications to the treatment plan.

## 2023-01-04 ENCOUNTER — Other Ambulatory Visit: Payer: Self-pay

## 2023-01-09 ENCOUNTER — Encounter: Payer: Self-pay | Admitting: Hematology

## 2023-01-09 ENCOUNTER — Other Ambulatory Visit: Payer: Self-pay

## 2023-01-09 ENCOUNTER — Encounter: Payer: Self-pay | Admitting: Internal Medicine

## 2023-01-09 ENCOUNTER — Ambulatory Visit: Payer: 59 | Attending: Internal Medicine | Admitting: Internal Medicine

## 2023-01-09 ENCOUNTER — Telehealth: Payer: Self-pay | Admitting: Orthopedic Surgery

## 2023-01-09 VITALS — BP 130/66 | HR 69 | Temp 98.1°F | Ht 67.0 in | Wt 171.0 lb

## 2023-01-09 DIAGNOSIS — N1831 Chronic kidney disease, stage 3a: Secondary | ICD-10-CM

## 2023-01-09 DIAGNOSIS — Z794 Long term (current) use of insulin: Secondary | ICD-10-CM

## 2023-01-09 DIAGNOSIS — E1142 Type 2 diabetes mellitus with diabetic polyneuropathy: Secondary | ICD-10-CM

## 2023-01-09 DIAGNOSIS — E785 Hyperlipidemia, unspecified: Secondary | ICD-10-CM | POA: Diagnosis not present

## 2023-01-09 DIAGNOSIS — M75101 Unspecified rotator cuff tear or rupture of right shoulder, not specified as traumatic: Secondary | ICD-10-CM

## 2023-01-09 DIAGNOSIS — F17210 Nicotine dependence, cigarettes, uncomplicated: Secondary | ICD-10-CM | POA: Diagnosis not present

## 2023-01-09 DIAGNOSIS — E1159 Type 2 diabetes mellitus with other circulatory complications: Secondary | ICD-10-CM | POA: Diagnosis not present

## 2023-01-09 DIAGNOSIS — M12811 Other specific arthropathies, not elsewhere classified, right shoulder: Secondary | ICD-10-CM

## 2023-01-09 DIAGNOSIS — E1169 Type 2 diabetes mellitus with other specified complication: Secondary | ICD-10-CM

## 2023-01-09 DIAGNOSIS — Z23 Encounter for immunization: Secondary | ICD-10-CM

## 2023-01-09 DIAGNOSIS — M48061 Spinal stenosis, lumbar region without neurogenic claudication: Secondary | ICD-10-CM

## 2023-01-09 DIAGNOSIS — I152 Hypertension secondary to endocrine disorders: Secondary | ICD-10-CM | POA: Diagnosis not present

## 2023-01-09 LAB — POCT GLYCOSYLATED HEMOGLOBIN (HGB A1C): HbA1c, POC (controlled diabetic range): 6.3 % (ref 0.0–7.0)

## 2023-01-09 LAB — GLUCOSE, POCT (MANUAL RESULT ENTRY): POC Glucose: 195 mg/dl — AB (ref 70–99)

## 2023-01-09 MED ORDER — AMLODIPINE BESYLATE 10 MG PO TABS
10.0000 mg | ORAL_TABLET | Freq: Every day | ORAL | 1 refills | Status: DC
  Filled 2023-01-09 – 2023-03-29 (×2): qty 90, 90d supply, fill #0
  Filled 2023-08-20: qty 90, 90d supply, fill #1

## 2023-01-09 MED ORDER — ZOSTER VAC RECOMB ADJUVANTED 50 MCG/0.5ML IM SUSR
0.5000 mL | Freq: Once | INTRAMUSCULAR | 0 refills | Status: AC
Start: 2023-01-09 — End: 2023-01-10
  Filled 2023-01-09: qty 0.5, 1d supply, fill #0

## 2023-01-09 MED ORDER — GABAPENTIN 300 MG PO CAPS
ORAL_CAPSULE | ORAL | 11 refills | Status: DC
Start: 2023-01-09 — End: 2024-02-19
  Filled 2023-01-09: qty 120, 30d supply, fill #0
  Filled 2023-02-20: qty 120, 30d supply, fill #1
  Filled 2023-04-02: qty 120, 30d supply, fill #2
  Filled 2023-05-07: qty 120, 30d supply, fill #3
  Filled 2023-06-12: qty 120, 30d supply, fill #4
  Filled 2023-07-23: qty 120, 30d supply, fill #5
  Filled 2023-08-27: qty 120, 30d supply, fill #6
  Filled 2023-09-27: qty 120, 30d supply, fill #7
  Filled 2023-11-19: qty 120, 30d supply, fill #8
  Filled 2024-01-03: qty 120, 30d supply, fill #9

## 2023-01-09 MED ORDER — PRAVASTATIN SODIUM 20 MG PO TABS
20.0000 mg | ORAL_TABLET | Freq: Every day | ORAL | 1 refills | Status: DC
Start: 2023-01-09 — End: 2023-10-19
  Filled 2023-01-09 – 2023-02-20 (×2): qty 90, 90d supply, fill #0
  Filled 2023-06-04: qty 90, 90d supply, fill #1

## 2023-01-09 NOTE — Patient Instructions (Signed)

## 2023-01-09 NOTE — Telephone Encounter (Signed)
Mailed reminder letter to patient today 

## 2023-01-09 NOTE — Progress Notes (Signed)
Patient ID: Antwain Dam Scribner, male    DOB: [birth year]  MRN: [id number]  CC: Diabetes (DM f/u. Med refills. Valentino Hue to [facility]. )   Subjective: Marce Swanner is a 77 y.o. male who presents for chronic ds management His concerns today include:  Patient with history of HTN, DM 2 with polyneuropathy, HL, CKD stage 3, tob dependence, EtOH abuse, prostate CA, OA knee, vit B 12 def, spinal stenosis, and multiple myeloma in remission, memory changes MMSE 25/30 2023), CTS RT hand/EMG 12/2018.     C/o pain RT shoulder, lower back and sometimes neck Had recent MRI of neck, RT shoulder and lumbar spine.  MRI of the C-spine did not show any spinal stenosis or nerve compression.  Did have some osteophytes at C4-6 6 which indented on the posterior hypopharynx and esophagus MRI of the right shoulder showed torn supraspinatus, severe tendinosis of the infraspinatus tendon and complete tear of the proximal head of the biceps tendon. -MRI of the lumbar spine showed broad disc bulges at L3-L5 along with a 1 x 1.8 cm cyst along the dorsal midline of the thecal sac between the ligamentum flavum with mass effect on the thecal sac and mild resultant spinal stenosis.  Had some foraminal stenosis at L3-4 and L5-S1.  Saw Dr.Moore 11/09/22 for his lower back.  Deemed not to be surgical candidate due to being current smoker and A1C at the time above goal. Changed to Lyrica 75 mg BID from Gabapentin.  -Patient does not find the Lyrica helpful and would like to go back on gabapentin.  He has been taking Exer strength Tylenol 2 tablets every 6 hours. -Reports pain in his back mainly with walking and when doing household activities.  No pain with sitting or lying down.  Pain does not radiate down the legs.  No numbness in his legs.  Has chronic numbness in his feet.  No loss of bowel or bladder function.   -Right shoulder hurts intermittently mainly with certain movements like attempted elevation above the head or and moving his  arm backward.   -  DM Results for orders placed or performed in visit on 01/09/23  POCT glucose (manual entry)  Result Value Ref Range   POC Glucose 195 (A) 70 - 99 mg/dl  POCT glycosylated hemoglobin (Hb A1C)  Result Value Ref Range   Hemoglobin A1C     HbA1c POC (<> result, manual entry)     HbA1c, POC (prediabetic range)     HbA1c, POC (controlled diabetic range) 6.3 0.0 - 7.0 %   *Note: Due to a large number of results and/or encounters for the requested time period, some results have not been displayed. A complete set of results can be found in Results Review.  Taking Humalog 75/25 insulin 8 units BID.  Increases to 10 units when he has to take dexamethasone associated with treatment for his myeloma. Checking BS 2-3x/day.  Gives range 115-170.  Few lows in the 60s.   Did feel it Eating okay.  Thinks he may be eating wrong stuff and would like some information about healthy eating habits.  HTN: Compliant with taking Norvasc 10 mg daily and Diovan 40 mg daily. Checking BP daily.  Reports good readings.  Tries to limit salt in foods No CP, SOB, dizziness or headache  Tob:  1 pk last 3 days.  Wants to quit but finding too difficult  HL: taking the Pravachol as precribed  CKD 3a: saw neph 2 wks ago.  GFR range 40-50s.    Patient Active Problem List   Diagnosis Date Noted   Drug-induced polyneuropathy (HCC) 09/07/2022   Pain in right shoulder 01/06/2022   Port-A-Cath in place 11/02/2021   Macroalbuminuric diabetic nephropathy (HCC) 08/31/2021   Multiple myeloma in relapse (HCC) 06/24/2021   Counseling regarding advance care planning and goals of care 06/24/2021   Memory changes 04/25/2019   Hyperlipidemia associated with type 2 diabetes mellitus (HCC) 04/25/2019   Chronic bilateral low back pain without sciatica 03/14/2019   Spinal stenosis, lumbar region with neurogenic claudication 03/14/2019   History of prostate cancer 12/20/2018   Neuropathy of right hand 12/20/2018    Primary osteoarthritis of right knee 01/07/2018   Diabetic polyneuropathy associated with type 2 diabetes mellitus (HCC) 01/07/2018   Vitamin B12 deficiency 09/04/2017   Cataract of both eyes 04/12/2017   Tobacco dependence 04/12/2017   Multiple myeloma in remission (HCC) 02/23/2017   Hyperlipidemia 06/21/2016   CKD (chronic kidney disease) stage 3, GFR 30-59 ml/min (HCC) 02/23/2016   Malignant neoplasm of prostate (HCC) 02/21/2016   Controlled type 2 diabetes mellitus with stage 3 chronic kidney disease, with long-term current use of insulin (HCC) 03/29/2015   HTN (hypertension) 07/28/2013   Anemia, chronic disease 07/26/2013   ETOH abuse 07/25/2013     Current Outpatient Medications on File Prior to Visit  Medication Sig Dispense Refill   Accu-Chek Softclix Lancets lancets Use to test blood sugar 3 times daily. 100 each 3   acetaminophen (TYLENOL) 500 MG tablet Take 2 tablets (1,000 mg total) by mouth every 6 (six) hours as needed. 30 tablet 0   acyclovir (ZOVIRAX) 400 MG tablet Take 1 tablet (400 mg total) by mouth 2 (two) times daily. 60 tablet 11   aspirin EC 81 MG tablet Take 81 mg by mouth daily. Swallow whole.     b complex vitamins capsule Take 1 capsule by mouth daily. 30 capsule 5   Blood Glucose Monitoring Suppl (ACCU-CHEK GUIDE) w/Device KIT Use to test blood sugar three times daily 1 kit 0   Continuous Blood Gluc Receiver (FREESTYLE LIBRE READER) DEVI Use to check blood sugar 3x daily. E11.42 1 each 0   Continuous Blood Gluc Sensor (FREESTYLE LIBRE SENSOR SYSTEM) MISC Use to check blood sugar 3 times daily. Change sensor every 2 weeks. 2 each 12   dexamethasone (DECADRON) 4 MG tablet 20mg  (5 tabs) Take the day after each dose of daratumumab. Take with breakfast 20 tablet 11   diclofenac Sodium (VOLTAREN) 1 % GEL Apply 4 g topically 4 (four) times daily. 150 g 5   ferrous sulfate 325 (65 FE) MG tablet Take 325 mg by mouth daily.     furosemide (LASIX) 20 MG tablet Take 1  tablet (20 mg total) by mouth daily in the morning for 7 days (until 09/08/22). Save remaining tablets. 30 tablet 0   glucose blood (ACCU-CHEK GUIDE) test strip Use to test blood sugar 3 times daily. 100 each 0   hydrOXYzine (ATARAX) 25 MG tablet TAKE 1 TABLET (25 MG TOTAL) BY MOUTH 3 (THREE) TIMES DAILY AS NEEDED FOR ITCHING (RASH). 30 tablet 0   Insulin Lispro Prot & Lispro (HUMALOG MIX 75/25 KWIKPEN) (75-25) 100 UNIT/ML Kwikpen Inject 8 units subcutaneously with breakfast and dinner. Increase to 10 units for 3-5 days twice a month after dexamethasone. 15 mL 11   Insulin Pen Needle (TECHLITE PEN NEEDLES) 32G X 4 MM MISC USE AS DIRECTED TWICE DAILY WITH INSULIN 100 each 0  ketoconazole (NIZORAL) 2 % cream Apply to skin of feet twice daily 60 g 2   Miconazole Nitrate (ATHLETES FOOT POWDER SPRAY) 2 % AERP Spray between toes and under toes once daily. 130 g 4   Multiple Vitamin (MULTIVITAMIN WITH MINERALS) TABS tablet Take 1 tablet by mouth daily. 60 tablet 2   ondansetron (ZOFRAN) 8 MG tablet Take 1 tablet (8 mg total) by mouth 2 (two) times daily as needed (Nausea or vomiting). 30 tablet 1   polyethylene glycol (MIRALAX) 17 g packet Take 17 g by mouth daily. 30 each 2   pomalidomide (POMALYST) 2 MG capsule Take 1 capsule (2 mg total) by mouth daily. 3 weeks on 1 week off 21 capsule 0   potassium chloride SA (KLOR-CON M) 20 MEQ tablet Take 1 tablet (20 mEq total) by mouth daily. 15 tablet 0   prochlorperazine (COMPAZINE) 10 MG tablet Take 1 tablet (10 mg total) by mouth every 6 (six) hours as needed (Nausea or vomiting). 30 tablet 1   tadalafil (CIALIS) 20 MG tablet Take 20 mg by mouth as needed.     tizanidine (ZANAFLEX) 2 MG capsule Take 1 capsule (2 mg total) by mouth 3 (three) times daily. 10 capsule 0   valsartan (DIOVAN) 40 MG tablet Take 1 tablet (40 mg total) by mouth daily. STOP LISINOPRIL 90 tablet 3   vitamin B-12 (CYANOCOBALAMIN) 100 MCG tablet Take 100 mcg by mouth daily.     donepezil  (ARICEPT) 5 MG tablet TAKE 1 TABLET (5 MG TOTAL) BY MOUTH AT BEDTIME. FOR MEMORY ISSUES 30 tablet 5   No current facility-administered medications on file prior to visit.    Allergies  Allergen Reactions   Other Other (See Comments)    Nicoderm CQ = Bad Dreams Nicotine gum = Bad Dreams    Lisinopril Cough    Social History   Socioeconomic History   Marital status: Single    Spouse name: Not on file   Number of children: Not on file   Years of education: Not on file   Highest education level: Not on file  Occupational History   Not on file  Tobacco Use   Smoking status: Every Day    Packs/day: 0.50    Years: 50.00    Additional pack years: 0.00    Total pack years: 25.00    Types: Cigarettes   Smokeless tobacco: Never   Tobacco comments:    1 ppwk-4-5 a day   Vaping Use   Vaping Use: Never used  Substance and Sexual Activity   Alcohol use: Yes    Alcohol/week: 1.0 standard drink of alcohol    Types: 1 Cans of beer per week    Comment: 1 every day-quit couple weeks ago   Drug use: No   Sexual activity: Not Currently  Other Topics Concern   Not on file  Social History Narrative   Not on file   Social Determinants of Health   Financial Resource Strain: Low Risk  (11/03/2022)   Overall Financial Resource Strain (CARDIA)    Difficulty of Paying Living Expenses: Not hard at all  Food Insecurity: No Food Insecurity (09/15/2022)   Hunger Vital Sign    Worried About Running Out of Food in the Last Year: Never true    Ran Out of Food in the Last Year: Never true  Transportation Needs: No Transportation Needs (09/15/2022)   PRAPARE - Administrator, Civil Service (Medical): No    Lack of Transportation (  Non-Medical): No  Physical Activity: Insufficiently Active (11/03/2022)   Exercise Vital Sign    Days of Exercise per Week: 1 day    Minutes of Exercise per Session: 20 min  Stress: No Stress Concern Present (11/03/2022)   [employer] of Occupational  Health - Occupational Stress Questionnaire    Feeling of Stress : Not at all  Social Connections: Socially Isolated (11/03/2022)   Social Connection and Isolation Panel [NHANES]    Frequency of Communication with Friends and Family: Once a week    Frequency of Social Gatherings with Friends and Family: Once a week    Attends Religious Services: 1 to 4 times per year    Active Member of [employer] or Organizations: No    Attends Banker Meetings: Never    Marital Status: Never married  Catering manager Violence: Not on file    Family History  Problem Relation Age of Onset   Kidney disease Mother    Cancer Neg Hx    Colon cancer Neg Hx    Colon polyps Neg Hx    Esophageal cancer Neg Hx    Rectal cancer Neg Hx    Stomach cancer Neg Hx     Past Surgical History:  Procedure Laterality Date   CIRCUMCISION  1990's   PROSTATE BIOPSY N/A 12/21/2015   Procedure: BIOPSY TRANSRECTAL ULTRASONIC PROSTATE (TUBP);  Surgeon: Jerilee Field, MD;  Location[facility];  Service: Urology;  Laterality: N/A;    ROS: Review of Systems Negative except as stated above  PHYSICAL EXAM: BP 130/66 (BP Location: Left Arm, Patient Position: Sitting, Cuff Size: Normal)   Pulse 69   Temp 98.1 F (36.7 C) (Oral)   Ht 5\' 7"  (1.702 m)   Wt 171 lb (77.6 kg)   SpO2 99%   BMI 26.78 kg/m   Physical Exam   General appearance - alert, well appearing, elderly African-American male and in no distress Mental status - normal mood, behavior, speech, dress, motor activity, and thought processes Chest - clear to auscultation, no wheezes, rales or rhonchi, symmetric air entry Heart - normal rate, regular rhythm, normal S1, S2, no murmurs, rubs, clicks or gallops Musculoskeletal -right shoulder: No tenderness on palpation of the glenohumeral joint.  He is able to elevate the arm to about 90 degrees.  Difficulty with internal and external rotation. No tenderness on palpation of the lumbar  spine or surrounding paraspinal muscles.  Straight leg raise negative.  He ambulates unassisted. Extremities - trace LE edema BL     Latest Ref Rng & Units 01/03/2023    9:17 AM 12/19/2022    7:57 AM 12/06/2022    8:16 AM  CMP  Glucose 70 - 99 mg/dL 161  096  045   BUN 8 - 23 mg/dL 13  16  16    Creatinine 0.61 - 1.24 mg/dL 4.09  8.11  9.14   Sodium 135 - 145 mmol/L 140  140  139   Potassium 3.5 - 5.1 mmol/L 3.1  3.5  3.4   Chloride 98 - 111 mmol/L 107  110  107   CO2 22 - 32 mmol/L 24  22  24    Calcium 8.9 - 10.3 mg/dL 9.5  9.5  9.9   Total Protein 6.5 - 8.1 g/dL 6.5  6.3  6.5   Total Bilirubin 0.3 - 1.2 mg/dL 0.6  0.6  0.7   Alkaline Phos 38 - 126 U/L 50  59  46   AST 15 -  41 U/L 16  15  18    ALT 0 - 44 U/L 12  13  19     Lipid Panel     Component Value Date/Time   CHOL 199 09/07/2022 1008   TRIG 116 09/07/2022 1008   HDL 96 09/07/2022 1008   CHOLHDL 2.1 09/07/2022 1008   CHOLHDL 3.6 06/21/2016 1047   VLDL 50 (H) 06/21/2016 1047   LDLCALC 83 09/07/2022 1008    CBC    Component Value Date/Time   WBC 3.6 (L) 01/03/2023 0917   WBC 5.6 07/13/2021 0741   RBC 2.99 (L) 01/03/2023 0917   HGB 9.9 (L) 01/03/2023 0917   HGB 12.2 (L) 12/06/2017 0920   HGB 10.1 (L) 07/13/2017 0938   HCT 29.1 (L) 01/03/2023 0917   HCT 37.1 (L) 12/06/2017 0920   HCT 30.6 (L) 07/13/2017 0938   PLT 262 01/03/2023 0917   PLT 250 12/06/2017 0920   MCV 97.3 01/03/2023 0917   MCV 98 (H) 12/06/2017 0920   MCV 96.2 07/13/2017 0938   MCH 33.1 01/03/2023 0917   MCHC 34.0 01/03/2023 0917   RDW 15.1 01/03/2023 0917   RDW 14.1 12/06/2017 0920   RDW 14.4 07/13/2017 0938   LYMPHSABS 1.2 01/03/2023 0917   LYMPHSABS 0.8 (L) 07/13/2017 0938   MONOABS 0.6 01/03/2023 0917   MONOABS 0.4 07/13/2017 0938   EOSABS 0.2 01/03/2023 0917   EOSABS 0.5 07/13/2017 0938   EOSABS 0.8 (H) 11/21/2016 1416   BASOSABS 0.1 01/03/2023 0917   BASOSABS 0.0 07/13/2017 0938    ASSESSMENT AND PLAN: 1. Type 2 diabetes mellitus  with peripheral neuropathy (HCC) At goal.  He will continue current dose of NovoLog 75/25 insulin of 8 units twice a day and increase to 10 units 1 on dexamethasone.  Discussed on encourage healthy eating habits. Patient wanted to go back to taking gabapentin for his neuropathy symptoms as he felt it worked better for him.  We stopped the Lyrica and placed him back on the gabapentin at his previous dose. - POCT glucose (manual entry) - POCT glycosylated hemoglobin (Hb A1C) - gabapentin (NEURONTIN) 300 MG capsule; Take 1 capsule (300 mg total) by mouth every morning AND 1 capsule (300 mg total) daily at 12 noon AND 2 capsules (600 mg total) at bedtime. Stop Lyrica.  Dispense: 120 capsule; Refill: 11  2. Hypertension associated with type 2 diabetes mellitus (HCC) At goal.  Continue Norvasc and Diovan - amLODipine (NORVASC) 10 MG tablet; Take 1 tablet (10 mg total) by mouth daily.  Dispense: 90 tablet; Refill: 1  3. Hyperlipidemia associated with type 2 diabetes mellitus (HCC) - pravastatin (PRAVACHOL) 20 MG tablet; Take 1 tablet (20 mg total) by mouth daily.  Dispense: 90 tablet; Refill: 1  4. Rotator cuff tear arthropathy of right shoulder - Ambulatory referral to Pain Clinic - Ambulatory referral to Orthopedics  5. Spinal stenosis of lumbar region without neurogenic claudication - Ambulatory referral to Pain Clinic - Ambulatory referral to Orthopedics  6. Stage 3a chronic kidney disease (HCC) Stable.  Followed by nephrology.  7. Need for shingles vaccine Patient agreeable to receiving second Shingrix vaccine.  Prescription given for him to take to the pharmacy. - Zoster Vaccine Adjuvanted [facility]) injection; Inject 0.5 mLs into the muscle once for 1 dose.  Dispense: 0.5 mL; Refill: 0     Patient was given the opportunity to ask questions.  Patient verbalized understanding of the plan and was able to repeat key elements of the plan.  This documentation was completed using Dietitian.  Any transcriptional errors are unintentional.  Orders Placed This Encounter  Procedures   Ambulatory referral to Pain Clinic   Ambulatory referral to Orthopedics   POCT glucose (manual entry)   POCT glycosylated hemoglobin (Hb A1C)     Requested Prescriptions   Signed Prescriptions Disp Refills   amLODipine (NORVASC) 10 MG tablet 90 tablet 1    Sig: Take 1 tablet (10 mg total) by mouth daily.   pravastatin (PRAVACHOL) 20 MG tablet 90 tablet 1    Sig: Take 1 tablet (20 mg total) by mouth daily.   gabapentin (NEURONTIN) 300 MG capsule 120 capsule 11    Sig: Take 1 capsule (300 mg total) by mouth every morning AND 1 capsule (300 mg total) daily at 12 noon AND 2 capsules (600 mg total) at bedtime. Stop Lyrica.   Zoster Vaccine Adjuvanted [facility]) injection 0.5 mL 0    Sig: Inject 0.5 mLs into the muscle once for 1 dose.    Return in about 4 months (around 05/12/2023).  Jonah Blue, MD, FACP

## 2023-01-10 ENCOUNTER — Other Ambulatory Visit: Payer: Self-pay

## 2023-01-16 ENCOUNTER — Encounter: Payer: Self-pay | Admitting: Physical Medicine and Rehabilitation

## 2023-01-16 MED FILL — Dexamethasone Sodium Phosphate Inj 100 MG/10ML: INTRAMUSCULAR | Qty: 2 | Status: AC

## 2023-01-17 ENCOUNTER — Other Ambulatory Visit: Payer: Self-pay

## 2023-01-17 ENCOUNTER — Inpatient Hospital Stay: Payer: 59 | Attending: Hematology

## 2023-01-17 ENCOUNTER — Inpatient Hospital Stay: Payer: 59

## 2023-01-17 VITALS — BP 143/64 | HR 66 | Temp 98.5°F | Resp 18

## 2023-01-17 DIAGNOSIS — D721 Eosinophilia, unspecified: Secondary | ICD-10-CM | POA: Insufficient documentation

## 2023-01-17 DIAGNOSIS — I129 Hypertensive chronic kidney disease with stage 1 through stage 4 chronic kidney disease, or unspecified chronic kidney disease: Secondary | ICD-10-CM | POA: Diagnosis not present

## 2023-01-17 DIAGNOSIS — C9002 Multiple myeloma in relapse: Secondary | ICD-10-CM | POA: Diagnosis not present

## 2023-01-17 DIAGNOSIS — E1122 Type 2 diabetes mellitus with diabetic chronic kidney disease: Secondary | ICD-10-CM | POA: Diagnosis not present

## 2023-01-17 DIAGNOSIS — G620 Drug-induced polyneuropathy: Secondary | ICD-10-CM | POA: Insufficient documentation

## 2023-01-17 DIAGNOSIS — D649 Anemia, unspecified: Secondary | ICD-10-CM | POA: Diagnosis not present

## 2023-01-17 DIAGNOSIS — Z7189 Other specified counseling: Secondary | ICD-10-CM

## 2023-01-17 DIAGNOSIS — Z5112 Encounter for antineoplastic immunotherapy: Secondary | ICD-10-CM | POA: Diagnosis not present

## 2023-01-17 DIAGNOSIS — N183 Chronic kidney disease, stage 3 unspecified: Secondary | ICD-10-CM | POA: Diagnosis not present

## 2023-01-17 LAB — COMPREHENSIVE METABOLIC PANEL
ALT: 15 U/L (ref 0–44)
AST: 19 U/L (ref 15–41)
Albumin: 4 g/dL (ref 3.5–5.0)
Alkaline Phosphatase: 50 U/L (ref 38–126)
Anion gap: 10 (ref 5–15)
BUN: 12 mg/dL (ref 8–23)
CO2: 24 mmol/L (ref 22–32)
Calcium: 9.6 mg/dL (ref 8.9–10.3)
Chloride: 107 mmol/L (ref 98–111)
Creatinine, Ser: 1.35 mg/dL — ABNORMAL HIGH (ref 0.61–1.24)
GFR, Estimated: 55 mL/min — ABNORMAL LOW (ref >60)
Glucose, Bld: 159 mg/dL — ABNORMAL HIGH (ref 70–99)
Potassium: 3.6 mmol/L (ref 3.5–5.1)
Sodium: 141 mmol/L (ref 135–145)
Total Bilirubin: 0.8 mg/dL (ref 0.3–1.2)
Total Protein: 6.7 g/dL (ref 6.5–8.1)

## 2023-01-17 LAB — CBC WITH DIFFERENTIAL (CANCER CENTER ONLY)
Abs Immature Granulocytes: 0.02 10*3/uL (ref 0.00–0.07)
Basophils Absolute: 0.1 10*3/uL (ref 0.0–0.1)
Basophils Relative: 3 %
Eosinophils Absolute: 0.3 10*3/uL (ref 0.0–0.5)
Eosinophils Relative: 7 %
HCT: 31.6 % — ABNORMAL LOW (ref 39.0–52.0)
Hemoglobin: 10.8 g/dL — ABNORMAL LOW (ref 13.0–17.0)
Immature Granulocytes: 1 %
Lymphocytes Relative: 30 %
Lymphs Abs: 1.2 10*3/uL (ref 0.7–4.0)
MCH: 33.3 pg (ref 26.0–34.0)
MCHC: 34.2 g/dL (ref 30.0–36.0)
MCV: 97.5 fL (ref 80.0–100.0)
Monocytes Absolute: 0.6 10*3/uL (ref 0.1–1.0)
Monocytes Relative: 15 %
Neutro Abs: 1.8 10*3/uL (ref 1.7–7.7)
Neutrophils Relative %: 44 %
Platelet Count: 214 10*3/uL (ref 150–400)
RBC: 3.24 MIL/uL — ABNORMAL LOW (ref 4.22–5.81)
RDW: 14.8 % (ref 11.5–15.5)
WBC Count: 4 10*3/uL (ref 4.0–10.5)
nRBC: 0 % (ref 0.0–0.2)

## 2023-01-17 MED ORDER — FAMOTIDINE IN NACL 20-0.9 MG/50ML-% IV SOLN
20.0000 mg | Freq: Once | INTRAVENOUS | Status: AC
  Administered 2023-01-17: 20 mg via INTRAVENOUS
  Filled 2023-01-17: qty 50

## 2023-01-17 MED ORDER — DIPHENHYDRAMINE HCL 25 MG PO CAPS
50.0000 mg | ORAL_CAPSULE | Freq: Once | ORAL | Status: AC
  Administered 2023-01-17: 50 mg via ORAL
  Filled 2023-01-17: qty 2

## 2023-01-17 MED ORDER — SODIUM CHLORIDE 0.9 % IV SOLN
16.0000 mg/kg | Freq: Once | INTRAVENOUS | Status: AC
  Administered 2023-01-17: 1200 mg via INTRAVENOUS
  Filled 2023-01-17: qty 60

## 2023-01-17 MED ORDER — POMALIDOMIDE 2 MG PO CAPS
2.0000 mg | ORAL_CAPSULE | Freq: Every day | ORAL | 0 refills | Status: DC
Start: 2023-01-17 — End: 2023-02-13

## 2023-01-17 MED ORDER — ACETAMINOPHEN 325 MG PO TABS
650.0000 mg | ORAL_TABLET | Freq: Once | ORAL | Status: AC
  Administered 2023-01-17: 650 mg via ORAL
  Filled 2023-01-17: qty 2

## 2023-01-17 MED ORDER — SODIUM CHLORIDE 0.9 % IV SOLN: Freq: Once | INTRAVENOUS | Status: AC

## 2023-01-17 MED ORDER — SODIUM CHLORIDE 0.9 % IV SOLN
20.0000 mg | Freq: Once | INTRAVENOUS | Status: AC
  Administered 2023-01-17: 20 mg via INTRAVENOUS
  Filled 2023-01-17: qty 20

## 2023-01-17 NOTE — Patient Instructions (Signed)
[facility]  Discharge Instructions: Thank you for choosing [facility] to provide your oncology and hematology care.   If you have a lab appointment with the Cancer Center, please go directly to the Cancer Center and check in at the registration area.   Wear comfortable clothing and clothing appropriate for easy access to any Portacath or PICC line.   We strive to give you quality time with your provider. You may need to reschedule your appointment if you arrive late (15 or more minutes).  Arriving late affects you and other patients whose appointments are after yours.  Also, if you miss three or more appointments without notifying the office, you may be dismissed from the clinic at the provider's discretion.      For prescription refill requests, have your pharmacy contact our office and allow 72 hours for refills to be completed.    Today you received the following chemotherapy and/or immunotherapy agents darzalex      To help prevent nausea and vomiting after your treatment, we encourage you to take your nausea medication as directed.  BELOW ARE SYMPTOMS THAT SHOULD BE REPORTED IMMEDIATELY: *FEVER GREATER THAN 100.4 F (38 C) OR HIGHER *CHILLS OR SWEATING *NAUSEA AND VOMITING THAT IS NOT CONTROLLED WITH YOUR NAUSEA MEDICATION *UNUSUAL SHORTNESS OF BREATH *UNUSUAL BRUISING OR BLEEDING *URINARY PROBLEMS (pain or burning when urinating, or frequent urination) *BOWEL PROBLEMS (unusual diarrhea, constipation, pain near the anus) TENDERNESS IN MOUTH AND THROAT WITH OR WITHOUT PRESENCE OF ULCERS (sore throat, sores in mouth, or a toothache) UNUSUAL RASH, SWELLING OR PAIN  UNUSUAL VAGINAL DISCHARGE OR ITCHING   Items with * indicate a potential emergency and should be followed up as soon as possible or go to the Emergency Department if any problems should occur.  Please show the CHEMOTHERAPY ALERT CARD or IMMUNOTHERAPY ALERT CARD at  check-in to the Emergency Department and triage nurse.  Should you have questions after your visit or need to cancel or reschedule your appointment, please contact [facility]  Dept: [phone]  and follow the prompts.  Office hours are 8:00 a.m. to 4:30 p.m. Monday - Friday. Please note that voicemails left after 4:00 p.m. may not be returned until the following business day.  We are closed weekends and major holidays. You have access to a nurse at all times for urgent questions. Please call the main number to the clinic Dept: [phone] and follow the prompts.   For any non-urgent questions, you may also contact your provider using MyChart. We now offer e-Visits for anyone 18 and older to request care online for non-urgent symptoms. For details visit [email or web address].   Also download the MyChart app! Go to the app store, search "MyChart", open the app, select [place], and log in with your MyChart username and password.   

## 2023-01-18 ENCOUNTER — Other Ambulatory Visit: Payer: Self-pay

## 2023-01-18 ENCOUNTER — Other Ambulatory Visit: Payer: Self-pay | Admitting: Internal Medicine

## 2023-01-18 DIAGNOSIS — E1142 Type 2 diabetes mellitus with diabetic polyneuropathy: Secondary | ICD-10-CM

## 2023-01-18 MED ORDER — ACCU-CHEK GUIDE VI STRP
ORAL_STRIP | 2 refills | Status: DC
Start: 2023-01-18 — End: 2023-04-18
  Filled 2023-01-18: qty 100, 33d supply, fill #0
  Filled 2023-02-15: qty 100, 33d supply, fill #1
  Filled 2023-03-26: qty 100, 33d supply, fill #2

## 2023-01-19 ENCOUNTER — Other Ambulatory Visit: Payer: Self-pay

## 2023-01-22 LAB — MULTIPLE MYELOMA PANEL, SERUM
Albumin SerPl Elph-Mcnc: 4 g/dL (ref 2.9–4.4)
Albumin/Glob SerPl: 1.7 (ref 0.7–1.7)
Alpha 1: 0.2 g/dL (ref 0.0–0.4)
Alpha2 Glob SerPl Elph-Mcnc: 0.8 g/dL (ref 0.4–1.0)
B-Globulin SerPl Elph-Mcnc: 0.8 g/dL (ref 0.7–1.3)
Gamma Glob SerPl Elph-Mcnc: 0.6 g/dL (ref 0.4–1.8)
Globulin, Total: 2.5 g/dL (ref 2.2–3.9)
IgA: 107 mg/dL (ref 61–437)
IgG (Immunoglobin G), Serum: 664 mg/dL (ref 603–1613)
IgM (Immunoglobulin M), Srm: 27 mg/dL (ref 15–143)
M Protein SerPl Elph-Mcnc: 0.2 g/dL — ABNORMAL HIGH
Total Protein ELP: 6.5 g/dL (ref 6.0–8.5)

## 2023-01-29 ENCOUNTER — Other Ambulatory Visit: Payer: Self-pay

## 2023-01-29 MED FILL — Dexamethasone Sodium Phosphate Inj 100 MG/10ML: INTRAMUSCULAR | Qty: 2 | Status: AC

## 2023-01-30 ENCOUNTER — Other Ambulatory Visit: Payer: Self-pay

## 2023-01-30 ENCOUNTER — Inpatient Hospital Stay (HOSPITAL_BASED_OUTPATIENT_CLINIC_OR_DEPARTMENT_OTHER): Payer: 59 | Admitting: Physician Assistant

## 2023-01-30 ENCOUNTER — Inpatient Hospital Stay: Payer: 59

## 2023-01-30 VITALS — BP 129/64 | HR 70 | Temp 98.7°F | Resp 18 | Ht 67.0 in | Wt 167.0 lb

## 2023-01-30 DIAGNOSIS — D721 Eosinophilia, unspecified: Secondary | ICD-10-CM | POA: Diagnosis not present

## 2023-01-30 DIAGNOSIS — Z7189 Other specified counseling: Secondary | ICD-10-CM

## 2023-01-30 DIAGNOSIS — G620 Drug-induced polyneuropathy: Secondary | ICD-10-CM | POA: Diagnosis not present

## 2023-01-30 DIAGNOSIS — C9002 Multiple myeloma in relapse: Secondary | ICD-10-CM

## 2023-01-30 DIAGNOSIS — N183 Chronic kidney disease, stage 3 unspecified: Secondary | ICD-10-CM | POA: Diagnosis not present

## 2023-01-30 DIAGNOSIS — D649 Anemia, unspecified: Secondary | ICD-10-CM | POA: Diagnosis not present

## 2023-01-30 DIAGNOSIS — I129 Hypertensive chronic kidney disease with stage 1 through stage 4 chronic kidney disease, or unspecified chronic kidney disease: Secondary | ICD-10-CM | POA: Diagnosis not present

## 2023-01-30 DIAGNOSIS — Z5112 Encounter for antineoplastic immunotherapy: Secondary | ICD-10-CM | POA: Diagnosis not present

## 2023-01-30 DIAGNOSIS — E1122 Type 2 diabetes mellitus with diabetic chronic kidney disease: Secondary | ICD-10-CM | POA: Diagnosis not present

## 2023-01-30 LAB — COMPREHENSIVE METABOLIC PANEL
ALT: 15 U/L (ref 0–44)
AST: 16 U/L (ref 15–41)
Albumin: 4.1 g/dL (ref 3.5–5.0)
Alkaline Phosphatase: 40 U/L (ref 38–126)
Anion gap: 8 (ref 5–15)
BUN: 11 mg/dL (ref 8–23)
CO2: 21 mmol/L — ABNORMAL LOW (ref 22–32)
Calcium: 9.5 mg/dL (ref 8.9–10.3)
Chloride: 107 mmol/L (ref 98–111)
Creatinine, Ser: 1.34 mg/dL — ABNORMAL HIGH (ref 0.61–1.24)
GFR, Estimated: 56 mL/min — ABNORMAL LOW (ref >60)
Glucose, Bld: 219 mg/dL — ABNORMAL HIGH (ref 70–99)
Potassium: 4.1 mmol/L (ref 3.5–5.1)
Sodium: 136 mmol/L (ref 135–145)
Total Bilirubin: 0.5 mg/dL (ref 0.3–1.2)
Total Protein: 6.1 g/dL — ABNORMAL LOW (ref 6.5–8.1)

## 2023-01-30 LAB — CBC WITH DIFFERENTIAL (CANCER CENTER ONLY)
Abs Immature Granulocytes: 0 10*3/uL (ref 0.00–0.07)
Basophils Absolute: 0.1 10*3/uL (ref 0.0–0.1)
Basophils Relative: 2 %
Eosinophils Absolute: 0.2 10*3/uL (ref 0.0–0.5)
Eosinophils Relative: 4 %
HCT: 30.8 % — ABNORMAL LOW (ref 39.0–52.0)
Hemoglobin: 10.4 g/dL — ABNORMAL LOW (ref 13.0–17.0)
Immature Granulocytes: 0 %
Lymphocytes Relative: 31 %
Lymphs Abs: 1.7 10*3/uL (ref 0.7–4.0)
MCH: 33.2 pg (ref 26.0–34.0)
MCHC: 33.8 g/dL (ref 30.0–36.0)
MCV: 98.4 fL (ref 80.0–100.0)
Monocytes Absolute: 0.6 10*3/uL (ref 0.1–1.0)
Monocytes Relative: 11 %
Neutro Abs: 2.9 10*3/uL (ref 1.7–7.7)
Neutrophils Relative %: 52 %
Platelet Count: 261 10*3/uL (ref 150–400)
RBC: 3.13 MIL/uL — ABNORMAL LOW (ref 4.22–5.81)
RDW: 15.1 % (ref 11.5–15.5)
WBC Count: 5.4 10*3/uL (ref 4.0–10.5)
nRBC: 0 % (ref 0.0–0.2)

## 2023-01-30 MED ORDER — ACETAMINOPHEN 325 MG PO TABS
650.0000 mg | ORAL_TABLET | Freq: Once | ORAL | Status: AC
  Administered 2023-01-30: 650 mg via ORAL
  Filled 2023-01-30: qty 2

## 2023-01-30 MED ORDER — FAMOTIDINE IN NACL 20-0.9 MG/50ML-% IV SOLN
20.0000 mg | Freq: Once | INTRAVENOUS | Status: AC
  Administered 2023-01-30: 20 mg via INTRAVENOUS
  Filled 2023-01-30: qty 50

## 2023-01-30 MED ORDER — SODIUM CHLORIDE 0.9 % IV SOLN
20.0000 mg | Freq: Once | INTRAVENOUS | Status: DC
  Filled 2023-01-30: qty 2

## 2023-01-30 MED ORDER — SODIUM CHLORIDE 0.9 % IV SOLN: Freq: Once | INTRAVENOUS | Status: AC

## 2023-01-30 MED ORDER — SODIUM CHLORIDE 0.9 % IV SOLN
16.0000 mg/kg | Freq: Once | INTRAVENOUS | Status: AC
  Administered 2023-01-30: 1200 mg via INTRAVENOUS
  Filled 2023-01-30: qty 60

## 2023-01-30 MED ORDER — HEPARIN SOD (PORK) LOCK FLUSH 100 UNIT/ML IV SOLN: 500.0000 [IU] | Freq: Once | INTRAVENOUS | Status: DC | PRN

## 2023-01-30 MED ORDER — SODIUM CHLORIDE 0.9% FLUSH: 10.0000 mL | INTRAVENOUS | Status: DC | PRN

## 2023-01-30 MED ORDER — DIPHENHYDRAMINE HCL 25 MG PO CAPS
50.0000 mg | ORAL_CAPSULE | Freq: Once | ORAL | Status: AC
  Administered 2023-01-30: 50 mg via ORAL
  Filled 2023-01-30: qty 2

## 2023-01-30 NOTE — Patient Instructions (Signed)
[facility]  Discharge Instructions: Thank you for choosing [facility] to provide your oncology and hematology care.   If you have a lab appointment with the Cancer Center, please go directly to the Cancer Center and check in at the registration area.   Wear comfortable clothing and clothing appropriate for easy access to any Portacath or PICC line.   We strive to give you quality time with your provider. You may need to reschedule your appointment if you arrive late (15 or more minutes).  Arriving late affects you and other patients whose appointments are after yours.  Also, if you miss three or more appointments without notifying the office, you may be dismissed from the clinic at the provider's discretion.      For prescription refill requests, have your pharmacy contact our office and allow 72 hours for refills to be completed.    Today you received the following chemotherapy and/or immunotherapy agents: Darzalex    To help prevent nausea and vomiting after your treatment, we encourage you to take your nausea medication as directed.  BELOW ARE SYMPTOMS THAT SHOULD BE REPORTED IMMEDIATELY: *FEVER GREATER THAN 100.4 F (38 C) OR HIGHER *CHILLS OR SWEATING *NAUSEA AND VOMITING THAT IS NOT CONTROLLED WITH YOUR NAUSEA MEDICATION *UNUSUAL SHORTNESS OF BREATH *UNUSUAL BRUISING OR BLEEDING *URINARY PROBLEMS (pain or burning when urinating, or frequent urination) *BOWEL PROBLEMS (unusual diarrhea, constipation, pain near the anus) TENDERNESS IN MOUTH AND THROAT WITH OR WITHOUT PRESENCE OF ULCERS (sore throat, sores in mouth, or a toothache) UNUSUAL RASH, SWELLING OR PAIN  UNUSUAL VAGINAL DISCHARGE OR ITCHING   Items with * indicate a potential emergency and should be followed up as soon as possible or go to the Emergency Department if any problems should occur.  Please show the CHEMOTHERAPY ALERT CARD or IMMUNOTHERAPY ALERT CARD at check-in  to the Emergency Department and triage nurse.  Should you have questions after your visit or need to cancel or reschedule your appointment, please contact [facility]  Dept: [phone]  and follow the prompts.  Office hours are 8:00 a.m. to 4:30 p.m. Monday - Friday. Please note that voicemails left after 4:00 p.m. may not be returned until the following business day.  We are closed weekends and major holidays. You have access to a nurse at all times for urgent questions. Please call the main number to the clinic Dept: [phone] and follow the prompts.   For any non-urgent questions, you may also contact your provider using MyChart. We now offer e-Visits for anyone 18 and older to request care online for non-urgent symptoms. For details visit [email or web address].   Also download the MyChart app! Go to the app store, search "MyChart", open the app, select [place], and log in with your MyChart username and password.   

## 2023-01-31 ENCOUNTER — Ambulatory Visit: Payer: 59

## 2023-01-31 ENCOUNTER — Ambulatory Visit: Payer: 59 | Admitting: Hematology

## 2023-01-31 ENCOUNTER — Other Ambulatory Visit: Payer: 59

## 2023-02-01 ENCOUNTER — Other Ambulatory Visit: Payer: Self-pay

## 2023-02-01 ENCOUNTER — Encounter: Payer: Self-pay | Admitting: Hematology

## 2023-02-01 NOTE — Progress Notes (Signed)
HEMATOLOGY/ONCOLOGY CLINIC NOTE  Date of Service: 01/30/23  Patient Care Team: Robert Matar, MD as PCP - General (Internal Medicine)  CHIEF COMPLAINTS/PURPOSE OF CONSULTATION:  Multiple myeloma  INTERVAL HISTORY:   [name] is a 77 y.o. male is here for continued evaluation and management of his relapsed multiple myeloma. Patient was last seen by Dr. Candise Sparks on 01/03/2023.  [name] reports that he is tolerating daratumumab and pomalyst treatment without any new or concerning symptoms.  He reports his energy levels are fairly stable and he is able to complete his baseline ADLs on his own.  He denies any appetite changes or weight loss.  He denies nausea, vomiting or abdominal pain.  He reports having chronic constipation with a bowel every other day.  He denies easy bruising or signs of active bleeding.  Patient does have persistent neuropathy in his feet that is unchanged from prior.  He denies fevers, chills, sweats, shortness of breath, chest pain or cough.  He has no other complaints.   MEDICAL HISTORY:  Past Medical History:  Diagnosis Date   Anemia    Arthritis    shoulders,feet    Cataract    per eye dr -has appt 5-11   CKD (chronic kidney disease), stage III (HCC)    Elevated PSA    History of ketoacidosis    03-29-2015    History of sepsis    07-25-2013  non-compliant w/ medication   Hyperlipidemia    Hypertension    Nocturia    Peripheral neuropathy    Prostate cancer (HCC)    Type 2 diabetes mellitus with insulin therapy [facility])     SURGICAL HISTORY: Past Surgical History:  Procedure Laterality Date   CIRCUMCISION  1990's   PROSTATE BIOPSY N/A 12/21/2015   Procedure: BIOPSY TRANSRECTAL ULTRASONIC PROSTATE (TUBP);  Surgeon: Jerilee Field, MD;  Location: [facility];  Service: Urology;  Laterality: N/A;    SOCIAL HISTORY: Social History   Socioeconomic History   Marital status: Single    Spouse name: Not on file    Number of children: Not on file   Years of education: Not on file   Highest education level: Not on file  Occupational History   Not on file  Tobacco Use   Smoking status: Every Day    Current packs/day: 0.50    Average packs/day: 0.5 packs/day for 50.0 years (25.0 ttl pk-yrs)    Types: Cigarettes   Smokeless tobacco: Never   Tobacco comments:    1 ppwk-4-5 a day   Vaping Use   Vaping status: Never Used  Substance and Sexual Activity   Alcohol use: Yes    Alcohol/week: 1.0 standard drink of alcohol    Types: 1 Cans of beer per week    Comment: 1 every day-quit couple weeks ago   Drug use: No   Sexual activity: Not Currently  Other Topics Concern   Not on file  Social History Narrative   Not on file   Social Determinants of Health   Financial Resource Strain: Low Risk  (11/03/2022)   Overall Financial Resource Strain (CARDIA)    Difficulty of Paying Living Expenses: Not hard at all  Food Insecurity: No Food Insecurity (09/15/2022)   Hunger Vital Sign    Worried About Running Out of Food in the Last Year: Never true    Ran Out of Food in the Last Year: Never true  Transportation Needs: No Transportation Needs (09/15/2022)  PRAPARE - Administrator, Civil Service (Medical): No    Lack of Transportation (Non-Medical): No  Physical Activity: Insufficiently Active (11/03/2022)   Exercise Vital Sign    Days of Exercise per Week: 1 day    Minutes of Exercise per Session: 20 min  Stress: No Stress Concern Present (11/03/2022)   [employer] of Occupational Health - Occupational Stress Questionnaire    Feeling of Stress : Not at all  Social Connections: Socially Isolated (11/03/2022)   Social Connection and Isolation Panel [NHANES]    Frequency of Communication with Friends and Family: Once a week    Frequency of Social Gatherings with Friends and Family: Once a week    Attends Religious Services: 1 to 4 times per year    Active Member of [employer] or Organizations: No     Attends Engineer, structural: Never    Marital Status: Never married  Catering manager Violence: Not on file    FAMILY HISTORY: Family History  Problem Relation Age of Onset   Kidney disease Mother    Cancer Neg Hx    Colon cancer Neg Hx    Colon polyps Neg Hx    Esophageal cancer Neg Hx    Rectal cancer Neg Hx    Stomach cancer Neg Hx     ALLERGIES:  is allergic to other and lisinopril.  MEDICATIONS:  Current Outpatient Medications  Medication Sig Dispense Refill   Accu-Chek Softclix Lancets lancets Use to test blood sugar 3 times daily. 100 each 3   acetaminophen (TYLENOL) 500 MG tablet Take 2 tablets (1,000 mg total) by mouth every 6 (six) hours as needed. 30 tablet 0   acyclovir (ZOVIRAX) 400 MG tablet Take 1 tablet (400 mg total) by mouth 2 (two) times daily. 60 tablet 11   amLODipine (NORVASC) 10 MG tablet Take 1 tablet (10 mg total) by mouth daily. 90 tablet 1   aspirin EC 81 MG tablet Take 81 mg by mouth daily. Swallow whole.     b complex vitamins capsule Take 1 capsule by mouth daily. 30 capsule 5   Blood Glucose Monitoring Suppl (ACCU-CHEK GUIDE) w/Device KIT Use to test blood sugar three times daily 1 kit 0   Continuous Blood Gluc Receiver (FREESTYLE LIBRE READER) DEVI Use to check blood sugar 3x daily. E11.42 1 each 0   Continuous Blood Gluc Sensor (FREESTYLE LIBRE SENSOR SYSTEM) MISC Use to check blood sugar 3 times daily. Change sensor every 2 weeks. 2 each 12   dexamethasone (DECADRON) 4 MG tablet 20mg  (5 tabs) Take the day after each dose of daratumumab. Take with breakfast 20 tablet 11   diclofenac Sodium (VOLTAREN) 1 % GEL Apply 4 g topically 4 (four) times daily. 150 g 5   ferrous sulfate 325 (65 FE) MG tablet Take 325 mg by mouth daily.     furosemide (LASIX) 20 MG tablet Take 1 tablet (20 mg total) by mouth daily in the morning for 7 days (until 09/08/22). Save remaining tablets. 30 tablet 0   gabapentin (NEURONTIN) 300 MG capsule Take 1 capsule  (300 mg total) by mouth every morning AND 1 capsule (300 mg total) daily at 12 noon AND 2 capsules (600 mg total) at bedtime. Stop Lyrica. 120 capsule 11   glucose blood (ACCU-CHEK GUIDE) test strip Use to test blood sugar 3 times daily. 100 each 2   hydrOXYzine (ATARAX) 25 MG tablet TAKE 1 TABLET (25 MG TOTAL) BY MOUTH 3 (THREE) TIMES DAILY AS  NEEDED FOR ITCHING (RASH). 30 tablet 0   Insulin Lispro Prot & Lispro (HUMALOG MIX 75/25 KWIKPEN) (75-25) 100 UNIT/ML Kwikpen Inject 8 units subcutaneously with breakfast and dinner. Increase to 10 units for 3-5 days twice a month after dexamethasone. 15 mL 11   Insulin Pen Needle (TECHLITE PEN NEEDLES) 32G X 4 MM MISC USE AS DIRECTED TWICE DAILY WITH INSULIN 100 each 0   ketoconazole (NIZORAL) 2 % cream Apply to skin of feet twice daily 60 g 2   Miconazole Nitrate (ATHLETES FOOT POWDER SPRAY) 2 % AERP Spray between toes and under toes once daily. 130 g 4   Multiple Vitamin (MULTIVITAMIN WITH MINERALS) TABS tablet Take 1 tablet by mouth daily. 60 tablet 2   ondansetron (ZOFRAN) 8 MG tablet Take 1 tablet (8 mg total) by mouth 2 (two) times daily as needed (Nausea or vomiting). 30 tablet 1   polyethylene glycol (MIRALAX) 17 g packet Take 17 g by mouth daily. 30 each 2   pomalidomide (POMALYST) 2 MG capsule Take 1 capsule (2 mg total) by mouth daily. 3 weeks on 1 week off 21 capsule 0   potassium chloride SA (KLOR-CON M) 20 MEQ tablet Take 1 tablet (20 mEq total) by mouth daily. 15 tablet 0   pravastatin (PRAVACHOL) 20 MG tablet Take 1 tablet (20 mg total) by mouth daily. 90 tablet 1   prochlorperazine (COMPAZINE) 10 MG tablet Take 1 tablet (10 mg total) by mouth every 6 (six) hours as needed (Nausea or vomiting). 30 tablet 1   tadalafil (CIALIS) 20 MG tablet Take 20 mg by mouth as needed.     tizanidine (ZANAFLEX) 2 MG capsule Take 1 capsule (2 mg total) by mouth 3 (three) times daily. 10 capsule 0   valsartan (DIOVAN) 40 MG tablet Take 1 tablet (40 mg total) by  mouth daily. STOP LISINOPRIL 90 tablet 3   vitamin B-12 (CYANOCOBALAMIN) 100 MCG tablet Take 100 mcg by mouth daily.     donepezil (ARICEPT) 5 MG tablet TAKE 1 TABLET (5 MG TOTAL) BY MOUTH AT BEDTIME. FOR MEMORY ISSUES 30 tablet 5   No current facility-administered medications for this visit.    REVIEW OF SYSTEMS:  10 Point review of Systems was done is negative except as noted above.   PHYSICAL EXAMINATION: .BP 129/64 (BP Location: Right Arm, Patient Position: Sitting)   Pulse 70   Temp 98.7 F (37.1 C) (Oral)   Resp 18   Ht 5\' 7"  (1.702 m)   Wt 167 lb (75.8 kg)   SpO2 100%   BMI 26.16 kg/m  GENERAL:alert, in no acute distress and comfortable SKIN: no acute rashes, no significant lesions EYES: conjunctiva are pink and non-injected, sclera anicteric LUNGS: clear to auscultation b/l with normal respiratory effort HEART: regular rate & rhythm Extremity: no pedal edema PSYCH: alert & oriented x 3 with fluent speech NEURO: no focal motor/sensory deficits   LABORATORY DATA:  I have reviewed the data as listed  .    Latest Ref Rng & Units 01/30/2023   10:34 AM 01/17/2023    8:00 AM 01/03/2023    9:17 AM  CBC  WBC 4.0 - 10.5 K/uL 5.4  4.0  3.6   Hemoglobin 13.0 - 17.0 g/dL 25.3  66.4  9.9   Hematocrit 39.0 - 52.0 % 30.8  31.6  29.1   Platelets 150 - 400 K/uL 261  214  262       Latest Ref Rng & Units 01/30/2023   10:34  AM 01/17/2023    8:00 AM 01/03/2023    9:17 AM  CMP  Glucose 70 - 99 mg/dL 161  096  045   BUN 8 - 23 mg/dL 11  12  13    Creatinine 0.61 - 1.24 mg/dL 4.09  8.11  9.14   Sodium 135 - 145 mmol/L 136  141  140   Potassium 3.5 - 5.1 mmol/L 4.1  3.6  3.1   Chloride 98 - 111 mmol/L 107  107  107   CO2 22 - 32 mmol/L 21  24  24    Calcium 8.9 - 10.3 mg/dL 9.5  9.6  9.5   Total Protein 6.5 - 8.1 g/dL 6.1  6.7  6.5   Total Bilirubin 0.3 - 1.2 mg/dL 0.5  0.8  0.6   Alkaline Phos 38 - 126 U/L 40  50  50   AST 15 - 41 U/L 16  19  16    ALT 0 - 44 U/L 15  15  12        08/29/17 BM Bx:    08/29/17 Cytogenetics:   03/13/17 Cytogenetics:        PATHOLOGY Surgical Pathology  CASE: [id number]  PATIENT: Robert Sparks  Bone Marrow Report      Clinical History: Multiple myeloma in relapse (HCC) ,(BH)      DIAGNOSIS:   BONE MARROW, ASPIRATE, CLOT, CORE:  -Hypercellular bone marrow for age with plasma cell neoplasm  -See comment   PERIPHERAL BLOOD:  -Mild normocytic-normochromic anemia  -Eosinophilia   COMMENT:   The bone marrow is hypercellular for age with increased number of plasma  cells representing 8% of all cells in the aspirate associated with  numerous small clusters in the clot and biopsy sections. The plasma  cells display lambda light chain restriction consistent with plasma cell  neoplasm.  The background shows trilineage hematopoiesis with generally  nonspecific myeloid changes, likely secondary in nature in this setting.  Correlation with cytogenetic and FISH studies is recommended.      RADIOGRAPHIC STUDIES: I have personally reviewed the radiological images as listed and agreed with the findings in the report. No results found.  ASSESSMENT & PLAN:  Robert Sparks is a 77 y.o. who presents for continued management of multiple myeloma.   1.  Relapsed IgA Lambda Multiple Myeloma ISS Stage I  -Originally diagnosed based on presence of anemia, kidney insufficiency, and paraproteinemia with significant predominance of lambda light chains as well as significant elevation of IgA. -Received induction Velcade/Revlimid/Dexamethasone.  Had issues with skin rashes with Revlimid and this was discontinued.  Completed bortezomib dexamethasone induction and achieved complete remission. -Transitioned to maintenance Velcade 2 weeks which was then switched to maintenance Ninlaro.Patient had issues with worsening neuropathy which was a combination of Velcade and his diabetes.  Ninlaro was discontinued as well after  maintenance of more than 2 years.  2.  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 Bone marrow biopsy shows 8% lambda restricted plasma cells PET CT scan with no hypermetabolic osseous lesions  3.  Hypertension 4.  Diabetes type 2 5 .chronic kidney disease due to hypertension and diabetes. 6.  Peripheral neuropathy related to diabetes and previous myeloma treatments.  PLAN: -Due for Cycle 9, Day 15 of daratumumab.  -Labs today reviewed and require no intervention. WBC 5.4, Hgb 10.4, Plt 261. Creatinine is stable at 1.34. LFTs normal -Most recent myeloma labs from 01/17/2023  showed overall stable M protein 0.2 g/dL. -Proceed with daratumumab infusion today. Continue on pomalyst. No dose modifications.   FOLLOW-UP: Per integrated scheduling. Biweekly daratumumab treatment with labs and follow up every 4 weeks.   All of the patient's questions were answered with apparent satisfaction. The patient knows to call the clinic with any problems, questions or concerns.  I have spent a total of 30 minutes minutes of face-to-face and non-face-to-face time, preparing to see the patient, performing a medically appropriate examination, counseling and educating the patient, documenting clinical information in the electronic health record, and care coordination.   Georga Kaufmann PA-C Dept of Hematology and Oncology [facility] Phone: [phone]

## 2023-02-07 ENCOUNTER — Ambulatory Visit (INDEPENDENT_AMBULATORY_CARE_PROVIDER_SITE_OTHER): Payer: 59 | Admitting: Orthopedic Surgery

## 2023-02-07 DIAGNOSIS — M75121 Complete rotator cuff tear or rupture of right shoulder, not specified as traumatic: Secondary | ICD-10-CM

## 2023-02-07 DIAGNOSIS — M48062 Spinal stenosis, lumbar region with neurogenic claudication: Secondary | ICD-10-CM

## 2023-02-07 NOTE — Progress Notes (Addendum)
Orthopedic Spine Surgery Office Note  Assessment: Patient is a 77 y.o. male with 2 issues:  1) right shoulder pain consistent with a torn and retracted supraspinatus tendon 2) low back pain that radiates into the bilateral buttock and lateral aspect of the hips consistent with neurogenic claudication, Has a large facet cyst causing stenosis at L4/5.    Plan: -Patient has tried NSAIDs, Tylenol, Voltaren gel, PT, gabapentin, lyrica -I talked about surgical consultation with my partner Dr. August Saucer for his torn and retracted supraspinatus tendon.  He was not interested in that at this time.  I did tell him that if he waits to long and muscle undergoes fatty atrophy, it may not be reparable. He said he would call if he wants to pursue surgical treatment.  -For his lumbar spine, we talked about diagnostic/therapeutic injection.  He was not interested in this treatment either.  He said he is going to continue with gabapentin -His A1c is now under 7.5, but he would still need to be nicotine free prior to any elective spine surgery -Patient should return to office on an as-needed basis   Patient expressed understanding of the plan and all questions were answered to the patient's satisfaction.   __________________________________________________________________________  History: Patient is a 77 y.o. male who has been previously seen in the office for his lumbar spine.  He comes in today to talk about his his right shoulder pain and his lumbar spine.  He is still having pain in his low back that radiates into the bilateral buttock and the lateral aspect of the hips.  He says he only has the pain when he is standing or walking for more than a couple minutes.  He notes pain completely resolves if he sits down.  He does not have any pain when seated.  Denies paresthesias or numbness in the lower extremities.  No saddle anesthesia.  No bowel or bladder incontinence.  He felt that the Lyrica was not as helpful as  gabapentin and switched himself back to gabapentin.  He also wanted talk about his right shoulder today.  He said he has had several months of right shoulder pain.  There is no trauma or injury that preceded the onset of pain.  He notices the pain is worse when he is moving his shoulder especially with anything overhead.  He does not have pain in the shoulder of his arms at rest.  He does not have any pain radiating past the shoulder.  He has not noticed any weakness in the right arm.  Denies paresthesia numbness in the right upper extremity.  Previous treatments: NSAIDs, Tylenol, Voltaren gel, PT, gabapentin, lyrica   Physical Exam:  General: no acute distress, appears stated age Neurologic: alert, answering questions appropriately, following commands Respiratory: unlabored breathing on room air, symmetric chest rise Psychiatric: appropriate affect, normal cadence to speech   MSK (spine):  -Strength exam      Left  Right Grip strength                5/5  5/5 Interosseus   5/5   5/5 Wrist extension  5/5  5/5 Wrist flexion   5/5  5/5 Elbow flexion   5/5  5/5 Deltoid    5/5  4/5  EHL    5/5  5/5 TA    5/5  5/5 GSC    5/5  5/5 Knee extension  5/5  5/5 Hip flexion   5/5  5/5  -Sensory exam  Sensation intact to light touch in L3-S1 nerve distributions of bilateral lower extremities  Sensation intact to light touch in C5-T1 nerve distributions of bilateral upper extremities  -Brachioradialis DTR: 2/4 on the left, 2/4 on the right -Biceps DTR: 2/4 on the left, 2/4 on the right -Achilles DTR: 2/4 on the left, 2/4 on the right -Patellar tendon DTR: 1/4 on the left, 1/4 on the right  -Spurling: Negative bilaterally -Hoffman sign: Negative bilaterally -Clonus: No beats bilaterally -Interosseous wasting: None seen -Grip and release test: Negative  Left shoulder exam: No pain through range of motion Right shoulder exam: Pain with external rotation past 60 degrees, pain with  internal rotation past 70 degrees, weakness with Jobe test when compared to contralateral side, has pain with Jobe test, decreased length with external rotation with arm at side when compared to contralateral side, negative belly press  Imaging: XR of the cervical spine from 08/25/2022 was independently reviewed and interpreted, showing disc height loss with anterior osteophyte formation at C4/5, C5/6, and C6/7.  No evidence of instability on flexion/extension views.  No fracture or dislocation seen.  MRI of the cervical spine from 12/08/2022 was independently reviewed and interpreted, showing no significant foraminal stenosis.  No central stenosis.  No T2 cord signal change seen.  DDD at C4/5, C5/6.  XR of the lumbar spine from 08/25/2022 was previously independently reviewed and interpreted, showing joint space narrowing in the bilateral hips with subchondral sclerosis noted on the right hip.  Disc height loss at L3/4 and L4/5.  No evidence of instability on flexion/extension views.  No fracture or dislocation seen.    MRI of the lumbar spine from 10/19/2022 was previously independently reviewed and interpreted, showing lumbar synovial cyst at L4/5 that extends caudally behind the L5 body. Central stenosis at L4/5.    Patient name: Robert Sparks Patient MRN: [id number] Date of visit: 02/07/23

## 2023-02-13 ENCOUNTER — Other Ambulatory Visit: Payer: Self-pay

## 2023-02-13 DIAGNOSIS — C9002 Multiple myeloma in relapse: Secondary | ICD-10-CM

## 2023-02-13 MED ORDER — POMALIDOMIDE 2 MG PO CAPS: 2.0000 mg | ORAL_CAPSULE | Freq: Every day | ORAL | 0 refills | Status: DC

## 2023-02-13 MED FILL — Dexamethasone Sodium Phosphate Inj 100 MG/10ML: INTRAMUSCULAR | Qty: 2 | Status: AC

## 2023-02-14 ENCOUNTER — Inpatient Hospital Stay: Payer: 59

## 2023-02-15 ENCOUNTER — Other Ambulatory Visit: Payer: Self-pay

## 2023-02-16 ENCOUNTER — Other Ambulatory Visit: Payer: Self-pay

## 2023-02-19 ENCOUNTER — Ambulatory Visit (INDEPENDENT_AMBULATORY_CARE_PROVIDER_SITE_OTHER): Payer: 59 | Admitting: Podiatry

## 2023-02-19 ENCOUNTER — Encounter: Payer: Self-pay | Admitting: Podiatry

## 2023-02-19 DIAGNOSIS — M79675 Pain in left toe(s): Secondary | ICD-10-CM | POA: Diagnosis not present

## 2023-02-19 DIAGNOSIS — E1142 Type 2 diabetes mellitus with diabetic polyneuropathy: Secondary | ICD-10-CM

## 2023-02-19 DIAGNOSIS — M79674 Pain in right toe(s): Secondary | ICD-10-CM | POA: Diagnosis not present

## 2023-02-19 DIAGNOSIS — L84 Corns and callosities: Secondary | ICD-10-CM | POA: Diagnosis not present

## 2023-02-19 DIAGNOSIS — B351 Tinea unguium: Secondary | ICD-10-CM | POA: Diagnosis not present

## 2023-02-19 NOTE — Progress Notes (Signed)
This patient presents to the office with chief complaint of long thick painful nails.  Patient says the nails are painful walking and wearing shoes.  This patient is unable to self treat.  This patient is unable to trim his nails since he is unable to reach his nails.  She presents to the office for preventative foot care services.  General Appearance  Alert, conversant and in no acute stress.  Vascular  Dorsalis pedis and posterior tibial  pulses are palpable  bilaterally.  Capillary return is within normal limits  bilaterally. Temperature is within normal limits  bilaterally.  Neurologic  Senn-Weinstein monofilament wire test within normal limits  bilaterally. Muscle power within normal limits bilaterally.  Nails Thick disfigured discolored nails with subungual debris  from hallux to fifth toes bilaterally. No evidence of bacterial infection or drainage bilaterally.  Orthopedic  No limitations of motion  feet .  No crepitus or effusions noted.  No bony pathology or digital deformities noted.  Skin  normotropic skin with no porokeratosis noted bilaterally.  No signs of infections or ulcers noted.   Callus plantar right hallux.  Onychomycosis  Nails  B/L.  Pain in right toes  Pain in left toes  Callus right hallux.  Debridement of nails both feet followed trimming the nails with dremel tool.  Debride callus with # 15 blade and dremel tool.  RTC 10 weeks    Helane Gunther DPM

## 2023-02-20 ENCOUNTER — Other Ambulatory Visit: Payer: Self-pay

## 2023-02-27 MED FILL — Dexamethasone Sodium Phosphate Inj 100 MG/10ML: INTRAMUSCULAR | Qty: 2 | Status: AC

## 2023-02-28 ENCOUNTER — Other Ambulatory Visit: Payer: Self-pay

## 2023-02-28 ENCOUNTER — Ambulatory Visit: Payer: 59 | Admitting: Podiatry

## 2023-02-28 ENCOUNTER — Inpatient Hospital Stay (HOSPITAL_BASED_OUTPATIENT_CLINIC_OR_DEPARTMENT_OTHER): Payer: 59 | Admitting: Hematology

## 2023-02-28 ENCOUNTER — Inpatient Hospital Stay: Payer: 59 | Attending: Hematology

## 2023-02-28 ENCOUNTER — Inpatient Hospital Stay: Payer: 59

## 2023-02-28 VITALS — BP 131/64 | HR 73 | Temp 97.7°F

## 2023-02-28 VITALS — BP 132/61 | HR 72 | Temp 98.4°F | Resp 20 | Ht 66.0 in | Wt 166.8 lb

## 2023-02-28 DIAGNOSIS — C9002 Multiple myeloma in relapse: Secondary | ICD-10-CM | POA: Diagnosis not present

## 2023-02-28 DIAGNOSIS — Z5111 Encounter for antineoplastic chemotherapy: Secondary | ICD-10-CM | POA: Diagnosis not present

## 2023-02-28 DIAGNOSIS — Z7189 Other specified counseling: Secondary | ICD-10-CM

## 2023-02-28 DIAGNOSIS — Z5112 Encounter for antineoplastic immunotherapy: Secondary | ICD-10-CM | POA: Insufficient documentation

## 2023-02-28 LAB — COMPREHENSIVE METABOLIC PANEL
ALT: 14 U/L (ref 0–44)
AST: 15 U/L (ref 15–41)
Albumin: 4 g/dL (ref 3.5–5.0)
Alkaline Phosphatase: 62 U/L (ref 38–126)
Anion gap: 6 (ref 5–15)
BUN: 15 mg/dL (ref 8–23)
CO2: 25 mmol/L (ref 22–32)
Calcium: 9.3 mg/dL (ref 8.9–10.3)
Chloride: 106 mmol/L (ref 98–111)
Creatinine, Ser: 1.6 mg/dL — ABNORMAL HIGH (ref 0.61–1.24)
GFR, Estimated: 45 mL/min — ABNORMAL LOW (ref >60)
Glucose, Bld: 238 mg/dL — ABNORMAL HIGH (ref 70–99)
Potassium: 3.9 mmol/L (ref 3.5–5.1)
Sodium: 137 mmol/L (ref 135–145)
Total Bilirubin: 0.5 mg/dL (ref 0.3–1.2)
Total Protein: 6.6 g/dL (ref 6.5–8.1)

## 2023-02-28 LAB — CBC WITH DIFFERENTIAL (CANCER CENTER ONLY)
Abs Immature Granulocytes: 0.02 10*3/uL (ref 0.00–0.07)
Basophils Absolute: 0.1 10*3/uL (ref 0.0–0.1)
Basophils Relative: 1 %
Eosinophils Absolute: 0.1 10*3/uL (ref 0.0–0.5)
Eosinophils Relative: 3 %
HCT: 29.8 % — ABNORMAL LOW (ref 39.0–52.0)
Hemoglobin: 10.2 g/dL — ABNORMAL LOW (ref 13.0–17.0)
Immature Granulocytes: 1 %
Lymphocytes Relative: 36 %
Lymphs Abs: 1.4 10*3/uL (ref 0.7–4.0)
MCH: 33.2 pg (ref 26.0–34.0)
MCHC: 34.2 g/dL (ref 30.0–36.0)
MCV: 97.1 fL (ref 80.0–100.0)
Monocytes Absolute: 0.6 10*3/uL (ref 0.1–1.0)
Monocytes Relative: 15 %
Neutro Abs: 1.7 10*3/uL (ref 1.7–7.7)
Neutrophils Relative %: 44 %
Platelet Count: 232 10*3/uL (ref 150–400)
RBC: 3.07 MIL/uL — ABNORMAL LOW (ref 4.22–5.81)
RDW: 14.6 % (ref 11.5–15.5)
WBC Count: 3.8 10*3/uL — ABNORMAL LOW (ref 4.0–10.5)
nRBC: 0 % (ref 0.0–0.2)

## 2023-02-28 MED ORDER — ACETAMINOPHEN 325 MG PO TABS
650.0000 mg | ORAL_TABLET | Freq: Once | ORAL | Status: AC
  Administered 2023-02-28: 650 mg via ORAL
  Filled 2023-02-28: qty 2

## 2023-02-28 MED ORDER — SODIUM CHLORIDE 0.9 % IV SOLN
20.0000 mg | Freq: Once | INTRAVENOUS | Status: AC
  Administered 2023-02-28: 20 mg via INTRAVENOUS
  Filled 2023-02-28: qty 20

## 2023-02-28 MED ORDER — FAMOTIDINE IN NACL 20-0.9 MG/50ML-% IV SOLN
20.0000 mg | Freq: Once | INTRAVENOUS | Status: AC
  Administered 2023-02-28: 20 mg via INTRAVENOUS
  Filled 2023-02-28: qty 50

## 2023-02-28 MED ORDER — DIPHENHYDRAMINE HCL 25 MG PO CAPS
50.0000 mg | ORAL_CAPSULE | Freq: Once | ORAL | Status: AC
  Administered 2023-02-28: 50 mg via ORAL
  Filled 2023-02-28: qty 2

## 2023-02-28 MED ORDER — SODIUM CHLORIDE 0.9 % IV SOLN
16.0000 mg/kg | Freq: Once | INTRAVENOUS | Status: AC
  Administered 2023-02-28: 1200 mg via INTRAVENOUS
  Filled 2023-02-28: qty 60

## 2023-02-28 MED ORDER — SODIUM CHLORIDE 0.9 % IV SOLN: Freq: Once | INTRAVENOUS | Status: AC

## 2023-02-28 MED ORDER — POTASSIUM CHLORIDE CRYS ER 20 MEQ PO TBCR
20.0000 meq | EXTENDED_RELEASE_TABLET | Freq: Every day | ORAL | 1 refills | Status: DC
  Filled 2023-02-28: qty 30, 30d supply, fill #0
  Filled 2023-08-27: qty 30, 30d supply, fill #1

## 2023-02-28 NOTE — Progress Notes (Signed)
HEMATOLOGY/ONCOLOGY CLINIC NOTE  Date of Service: 02/28/23   Patient Care Team: Marcine Matar, MD as PCP - General (Internal Medicine)  CHIEF COMPLAINTS/PURPOSE OF CONSULTATION:  Follow-up for continued evaluation and management of relapsed multiple myeloma  Oncologic History:  77 y.o. male with diagnosis of IgA lambda active multiple myeloma, ISS Stage I. Active disease diagnosed based on presence of anemia, kidney insufficiency, and paraproteinemia with significant predominance of lambda light chains as well as significant elevation of IgA. Bone marrow biopsy confirmed presence of atypical monoclonal plasma cell process in the bone marrow comprising at least 7% of the cellularity. Based on the findings, patient was started on treatment with lenalidomide, bortezomib, and low-dose dexamethasone based on the anticipated tolerance by the patient. Lenalidomide was started at 10 mg daily based on creatinine clearance of 42, dexamethasone dose was reduced to 20 mg weekly based on patient's age. Treatment was complicated by rapid development of cutaneous rash attributed to lenalidomide, inaddition to decreased renal function and now patient has persistent severe anemia. Side-effects were attributed to Lenalidomide and lenalidomide was discontinued with subsequent recovery.  Patient has been receiving bortezomib weekly with low-dose dexamethasone in 4-day cycles.   Patient has completed induction systemic therapy for his disease with repeat bone marrow biopsy confirming complete response including no evidence of minimal residual disease by cytogenetics or FISH.  HISTORY OF PRESENTING ILLNESS:  Please see previous note for details of initial presentation.  [name] is a 77 y.o. male is here for continued evaluation and management of his relapsed multiple myeloma. Patient was last seen by me on 01/03/2023 and complained of shoulder, neck, and back pain. He also  reported limited ability to lift right upper extremity and arthritis in his cervical spine.   Patient was most recently seen by Thayil, PA on 01/30/2023 and reported chronic constipation and stable persistent neuropathy in feet.   Today, he reports that he is doing well overall. He complains of neck, shoulder, and back pain, especially when walking. Patient received an MRI which showed a couple tendon tears in his right shoulder as well as pinched nerves in the cervical spine with no signs of myeloma. He reports that his shoulder pain has been stable.   Patient has been tolerating Pomalidomide well with no toxicities. Patient has normal energy levels and p.o. intake, and his weight has been stable. He denies any fever, chills, night sweats, abdominal pain or infection issues.   Patient does sometimes endorse night sweats, which has been stable. His neuropathy has also been stable. Patient complains of dizziness sometimes.   MEDICAL HISTORY:  Past Medical History:  Diagnosis Date   Anemia    Arthritis    shoulders,feet    Cataract    per eye dr -has appt 5-11   CKD (chronic kidney disease), stage III (HCC)    Elevated PSA    History of ketoacidosis    03-29-2015    History of sepsis    07-25-2013  non-compliant w/ medication   Hyperlipidemia    Hypertension    Nocturia    Peripheral neuropathy    Prostate cancer (HCC)    Type 2 diabetes mellitus with insulin therapy [facility])     SURGICAL HISTORY: Past Surgical History:  Procedure Laterality Date   CIRCUMCISION  1990's   PROSTATE BIOPSY N/A 12/21/2015   Procedure: BIOPSY TRANSRECTAL ULTRASONIC PROSTATE (TUBP);  Surgeon: Jerilee Field, MD;  Location: [facility];  Service:  Urology;  Laterality: N/A;    SOCIAL HISTORY: Social History   Socioeconomic History   Marital status: Single    Spouse name: Not on file   Number of children: Not on file   Years of education: Not on file   Highest education level: Not on  file  Occupational History   Not on file  Tobacco Use   Smoking status: Every Day    Current packs/day: 0.50    Average packs/day: 0.5 packs/day for 50.0 years (25.0 ttl pk-yrs)    Types: Cigarettes   Smokeless tobacco: Never   Tobacco comments:    1 ppwk-4-5 a day   Vaping Use   Vaping status: Never Used  Substance and Sexual Activity   Alcohol use: Yes    Alcohol/week: 1.0 standard drink of alcohol    Types: 1 Cans of beer per week    Comment: 1 every day-quit couple weeks ago   Drug use: No   Sexual activity: Not Currently  Other Topics Concern   Not on file  Social History Narrative   Not on file   Social Determinants of Health   Financial Resource Strain: Low Risk  (11/03/2022)   Overall Financial Resource Strain (CARDIA)    Difficulty of Paying Living Expenses: Not hard at all  Food Insecurity: No Food Insecurity (09/15/2022)   Hunger Vital Sign    Worried About Running Out of Food in the Last Year: Never true    Ran Out of Food in the Last Year: Never true  Transportation Needs: No Transportation Needs (09/15/2022)   PRAPARE - Administrator, Civil Service (Medical): No    Lack of Transportation (Non-Medical): No  Physical Activity: Insufficiently Active (11/03/2022)   Exercise Vital Sign    Days of Exercise per Week: 1 day    Minutes of Exercise per Session: 20 min  Stress: No Stress Concern Present (11/03/2022)   [employer] of Occupational Health - Occupational Stress Questionnaire    Feeling of Stress : Not at all  Social Connections: Socially Isolated (11/03/2022)   Social Connection and Isolation Panel [NHANES]    Frequency of Communication with Friends and Family: Once a week    Frequency of Social Gatherings with Friends and Family: Once a week    Attends Religious Services: 1 to 4 times per year    Active Member of [employer] or Organizations: No    Attends Engineer, structural: Never    Marital Status: 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  MEDICATIONS:  Current Outpatient Medications  Medication Sig Dispense Refill   Accu-Chek Softclix Lancets lancets Use to test blood sugar 3 times daily. 100 each 3   acetaminophen (TYLENOL) 500 MG tablet Take 2 tablets (1,000 mg total) by mouth every 6 (six) hours as needed. 30 tablet 0   acyclovir (ZOVIRAX) 400 MG tablet Take 1 tablet (400 mg total) by mouth 2 (two) times daily. 60 tablet 11   amLODipine (NORVASC) 10 MG tablet Take 1 tablet (10 mg total) by mouth daily. 90 tablet 1  aspirin EC 81 MG tablet Take 81 mg by mouth daily. Swallow whole.     b complex vitamins capsule Take 1 capsule by mouth daily. 30 capsule 5   Blood Glucose Monitoring Suppl (ACCU-CHEK GUIDE) w/Device KIT Use to test blood sugar three times daily 1 kit 0   Continuous Blood Gluc Receiver (FREESTYLE LIBRE READER) DEVI Use to check blood sugar 3x daily. E11.42 1 each 0   Continuous Blood Gluc Sensor (FREESTYLE LIBRE SENSOR SYSTEM) MISC Use to check blood sugar 3 times daily. Change sensor every 2 weeks. 2 each 12   dexamethasone (DECADRON) 4 MG tablet 20mg  (5 tabs) Take the day after each dose of daratumumab. Take with breakfast 20 tablet 11   diclofenac Sodium (VOLTAREN) 1 % GEL Apply 4 g topically 4 (four) times daily. 150 g 5   donepezil (ARICEPT) 5 MG tablet TAKE 1 TABLET (5 MG TOTAL) BY MOUTH AT BEDTIME. FOR MEMORY ISSUES 30 tablet 5   ferrous sulfate 325 (65 FE) MG tablet Take 325 mg by mouth daily.     furosemide (LASIX) 20 MG tablet Take 1 tablet (20 mg total) by mouth daily in the morning for 7 days (until 09/08/22). Save remaining tablets. 30 tablet 0   gabapentin (NEURONTIN) 300 MG capsule Take 1  capsule (300 mg total) by mouth every morning AND 1 capsule (300 mg total) daily at 12 noon AND 2 capsules (600 mg total) at bedtime. Stop Lyrica. 120 capsule 11   glucose blood (ACCU-CHEK GUIDE) test strip Use to test blood sugar 3 times daily. 100 each 2   hydrOXYzine (ATARAX) 25 MG tablet TAKE 1 TABLET (25 MG TOTAL) BY MOUTH 3 (THREE) TIMES DAILY AS NEEDED FOR ITCHING (RASH). 30 tablet 0   Insulin Lispro Prot & Lispro (HUMALOG MIX 75/25 KWIKPEN) (75-25) 100 UNIT/ML Kwikpen Inject 8 units subcutaneously with breakfast and dinner. Increase to 10 units for 3-5 days twice a month after dexamethasone. 15 mL 11   Insulin Pen Needle (TECHLITE PEN NEEDLES) 32G X 4 MM MISC USE AS DIRECTED TWICE DAILY WITH INSULIN 100 each 0   ketoconazole (NIZORAL) 2 % cream Apply to skin of feet twice daily 60 g 2   Miconazole Nitrate (ATHLETES FOOT POWDER SPRAY) 2 % AERP Spray between toes and under toes once daily. 130 g 4   Multiple Vitamin (MULTIVITAMIN WITH MINERALS) TABS tablet Take 1 tablet by mouth daily. 60 tablet 2   ondansetron (ZOFRAN) 8 MG tablet Take 1 tablet (8 mg total) by mouth 2 (two) times daily as needed (Nausea or vomiting). 30 tablet 1   polyethylene glycol (MIRALAX) 17 g packet Take 17 g by mouth daily. 30 each 2   pomalidomide (POMALYST) 2 MG capsule Take 1 capsule (2 mg total) by mouth daily. 3 weeks on 1 week off 21 capsule 0   potassium chloride SA (KLOR-CON M) 20 MEQ tablet Take 1 tablet (20 mEq total) by mouth daily. 15 tablet 0   pravastatin (PRAVACHOL) 20 MG tablet Take 1 tablet (20 mg total) by mouth daily. 90 tablet 1   prochlorperazine (COMPAZINE) 10 MG tablet Take 1 tablet (10 mg total) by mouth every 6 (six) hours as needed (Nausea or vomiting). 30 tablet 1   tadalafil (CIALIS) 20 MG tablet Take 20 mg by mouth as needed.     tizanidine (ZANAFLEX) 2 MG capsule Take 1 capsule (2 mg total) by mouth 3 (three) times daily. 10 capsule 0   valsartan (DIOVAN) 40 MG tablet Take  1 tablet (40 mg  total) by mouth daily. STOP LISINOPRIL 90 tablet 3   vitamin B-12 (CYANOCOBALAMIN) 100 MCG tablet Take 100 mcg by mouth daily.     No current facility-administered medications for this visit.    REVIEW OF SYSTEMS:  10 Point review of Systems was done is negative except as noted above.   PHYSICAL EXAMINATION: .BP 132/61 (BP Location: Right Arm, Patient Position: Sitting)   Pulse 72   Temp 98.4 F (36.9 C) (Oral)   Resp 20   Ht 5\' 6"  (1.676 m)   Wt 166 lb 12.8 oz (75.7 kg)   SpO2 100%   BMI 26.92 kg/m  GENERAL:alert, in no acute distress and comfortable SKIN: no acute rashes, no significant lesions EYES: conjunctiva are pink and non-injected, sclera anicteric OROPHARYNX: MMM, no exudates, no oropharyngeal erythema or ulceration NECK: supple, no JVD LYMPH:  no palpable lymphadenopathy in the cervical, axillary or inguinal regions LUNGS: clear to auscultation b/l with normal respiratory effort HEART: regular rate & rhythm ABDOMEN:  normoactive bowel sounds , non tender, not distended. Extremity: no pedal edema PSYCH: alert & oriented x 3 with fluent speech NEURO: no focal motor/sensory deficits   LABORATORY DATA:  I have reviewed the data as listed  .    Latest Ref Rng & Units 02/28/2023   10:45 AM 01/30/2023   10:34 AM 01/17/2023    8:00 AM  CBC  WBC 4.0 - 10.5 K/uL 3.8  5.4  4.0   Hemoglobin 13.0 - 17.0 g/dL 16.1  09.6  04.5   Hematocrit 39.0 - 52.0 % 29.8  30.8  31.6   Platelets 150 - 400 K/uL 232  261  214       Latest Ref Rng & Units 02/28/2023   10:45 AM 01/30/2023   10:34 AM 01/17/2023    8:00 AM  CMP  Glucose 70 - 99 mg/dL 409  811  914   BUN 8 - 23 mg/dL 15  11  12    Creatinine 0.61 - 1.24 mg/dL 7.82  9.56  2.13   Sodium 135 - 145 mmol/L 137  136  141   Potassium 3.5 - 5.1 mmol/L 3.9  4.1  3.6   Chloride 98 - 111 mmol/L 106  107  107   CO2 22 - 32 mmol/L 25  21  24    Calcium 8.9 - 10.3 mg/dL 9.3  9.5  9.6   Total Protein 6.5 - 8.1 g/dL 6.6  6.1  6.7    Total Bilirubin 0.3 - 1.2 mg/dL 0.5  0.5  0.8   Alkaline Phos 38 - 126 U/L 62  40  50   AST 15 - 41 U/L 15  16  19    ALT 0 - 44 U/L 14  15  15       08/29/17 BM Bx:    08/29/17 Cytogenetics:   03/13/17 Cytogenetics:        PATHOLOGY Surgical Pathology  CASE: [id number]  PATIENT: Robert Sparks  Bone Marrow Report      Clinical History: Multiple myeloma in relapse (HCC) ,(BH)      DIAGNOSIS:   BONE MARROW, ASPIRATE, CLOT, CORE:  -Hypercellular bone marrow for age with plasma cell neoplasm  -See comment   PERIPHERAL BLOOD:  -Mild normocytic-normochromic anemia  -Eosinophilia   COMMENT:   The bone marrow is hypercellular for age with increased number of plasma  cells representing 8% of all cells in the aspirate associated with  numerous small clusters in the  clot and biopsy sections. The plasma  cells display lambda light chain restriction consistent with plasma cell  neoplasm.  The background shows trilineage hematopoiesis with generally  nonspecific myeloid changes, likely secondary in nature in this setting.  Correlation with cytogenetic and FISH studies is recommended.      RADIOGRAPHIC STUDIES: I have personally reviewed the radiological images as listed and agreed with the findings in the report. No results found.  ASSESSMENT & PLAN:   77 y.o. male with   1.  Relapsed IgA Lambda Multiple Myeloma ISS Stage I    Active disease was previously diagnosed based on presence of anemia, kidney insufficiency, and paraproteinemia with significant predominance of lambda light chains as well as significant elevation of IgA. Patient is status post patient was treated with induction bortezomib Revlimid dexamethasone.  Had issues with skin rashes with Revlimid and this was discontinued.  Completed bortezomib dexamethasone induction and achieved complete remission. Was on maintenance Velcade 2 weeks which was then switched to maintenance Ninlaro. Patient had  issues with worsening neuropathy which was a combination of Velcade and his diabetes.  Ninlaro was discontinued as well after maintenance of more than 2 years.  2.  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 Bone marrow biopsy shows 8% lambda restricted plasma cells PET CT scan with no hypermetabolic osseous lesions  3.  Hypertension 4.  Diabetes type 2 5 .chronic kidney disease due to hypertension and diabetes. 6.  Peripheral neuropathy related to diabetes and previous myeloma treatments.  PLAN:  -Discussed lab results on 02/28/2023 in detail with patient. CBC showed WBC of 3.8K, hemoglobin of 10.2, and platelets of 232K. -last myeloma panel two months ago showed that M protein was undetectable. Myeloma lab from 1 month ago showed M protein level of 0.2, which is likely due to Daratumumab treatment.  -continue pomalidomide current dose -discussed option to switch IV daratumumab to Daratumumab faspro every 2 weeks or once a month, which paient is agreeable with -will proceed with cycle 10 of IV Daratumumab today at current dose -will switch to subcutaneous version of monthly Daratumumab in 2 weeks -no progression of myeloma based on labs. -No toxicities with Daratumumab and Pomalidomide.  -No new focal symptoms related to multiple myeloma. -continue to drink plenty of water to keep kidneys flushed -would not recommend patient to lift heavy objects at this time.  -would not recommend major surgery to manage neck/shoulder/back pain at this time -advised patient to maintain a good sleeping position and to use a comfortable pillow at night to manage pain -answered all of patient's questions in detail  -recommend patient to move legs around after sitting for extended periods prior to standing and to regularly use compression socks  -advised patient to connect with PCP to refill Valsartan if  needed  FOLLOW-UP: Switched IV Daratumumab to [place] Daratumumab faspro every 2 weeks -- plz schedule next 4 treatments with labs 'MD visit in 4 weeks  The total time spent in the appointment was 30 minutes* .  All of the patient's questions were answered with apparent satisfaction. The patient knows to call the clinic with any problems, questions or concerns.   Wyvonnia Lora MD MS AAHIVMS [facility] Hematology/Oncology Physician [facility]  .*Total Encounter Time as defined by the Centers for Medicare and Medicaid Services includes, in addition to the face-to-face time of a patient visit (documented in the note above) non-face-to-face time:  obtaining and reviewing outside history, ordering and reviewing medications, tests or procedures, care coordination (communications with other health care professionals or caregivers) and documentation in the medical record.    I,Mitra Faeizi,acting as a Neurosurgeon for Wyvonnia Lora, MD.,have documented all relevant documentation on the behalf of Wyvonnia Lora, MD,as directed by  Wyvonnia Lora, MD while in the presence of Wyvonnia Lora, MD.  .I have reviewed the above documentation for accuracy and completeness, and I agree with the above. Johney Maine MD

## 2023-02-28 NOTE — Progress Notes (Signed)
Error in charting.

## 2023-02-28 NOTE — Patient Instructions (Signed)
[facility]  Discharge Instructions: Thank you for choosing [facility] to provide your oncology and hematology care.   If you have a lab appointment with the Cancer Center, please go directly to the Cancer Center and check in at the registration area.   Wear comfortable clothing and clothing appropriate for easy access to any Portacath or PICC line.   We strive to give you quality time with your provider. You may need to reschedule your appointment if you arrive late (15 or more minutes).  Arriving late affects you and other patients whose appointments are after yours.  Also, if you miss three or more appointments without notifying the office, you may be dismissed from the clinic at the provider's discretion.      For prescription refill requests, have your pharmacy contact our office and allow 72 hours for refills to be completed.    Today you received the following chemotherapy and/or immunotherapy agents: Daratumumab.       To help prevent nausea and vomiting after your treatment, we encourage you to take your nausea medication as directed.  BELOW ARE SYMPTOMS THAT SHOULD BE REPORTED IMMEDIATELY: *FEVER GREATER THAN 100.4 F (38 C) OR HIGHER *CHILLS OR SWEATING *NAUSEA AND VOMITING THAT IS NOT CONTROLLED WITH YOUR NAUSEA MEDICATION *UNUSUAL SHORTNESS OF BREATH *UNUSUAL BRUISING OR BLEEDING *URINARY PROBLEMS (pain or burning when urinating, or frequent urination) *BOWEL PROBLEMS (unusual diarrhea, constipation, pain near the anus) TENDERNESS IN MOUTH AND THROAT WITH OR WITHOUT PRESENCE OF ULCERS (sore throat, sores in mouth, or a toothache) UNUSUAL RASH, SWELLING OR PAIN  UNUSUAL VAGINAL DISCHARGE OR ITCHING   Items with * indicate a potential emergency and should be followed up as soon as possible or go to the Emergency Department if any problems should occur.  Please show the CHEMOTHERAPY ALERT CARD or IMMUNOTHERAPY ALERT CARD at  check-in to the Emergency Department and triage nurse.  Should you have questions after your visit or need to cancel or reschedule your appointment, please contact [facility]  Dept: [phone]  and follow the prompts.  Office hours are 8:00 a.m. to 4:30 p.m. Monday - Friday. Please note that voicemails left after 4:00 p.m. may not be returned until the following business day.  We are closed weekends and major holidays. You have access to a nurse at all times for urgent questions. Please call the main number to the clinic Dept: [phone] and follow the prompts.   For any non-urgent questions, you may also contact your provider using MyChart. We now offer e-Visits for anyone 18 and older to request care online for non-urgent symptoms. For details visit [email or web address].   Also download the MyChart app! Go to the app store, search "MyChart", open the app, select [facility], and log in with your MyChart username and password.   

## 2023-03-01 ENCOUNTER — Other Ambulatory Visit: Payer: Self-pay

## 2023-03-02 ENCOUNTER — Ambulatory Visit: Payer: 59 | Admitting: Podiatry

## 2023-03-05 ENCOUNTER — Other Ambulatory Visit: Payer: Self-pay

## 2023-03-05 ENCOUNTER — Other Ambulatory Visit: Payer: Self-pay | Admitting: Internal Medicine

## 2023-03-05 LAB — MULTIPLE MYELOMA PANEL, SERUM
Albumin SerPl Elph-Mcnc: 3.4 g/dL (ref 2.9–4.4)
Albumin/Glob SerPl: 1.5 (ref 0.7–1.7)
Alpha 1: 0.2 g/dL (ref 0.0–0.4)
Alpha2 Glob SerPl Elph-Mcnc: 0.8 g/dL (ref 0.4–1.0)
B-Globulin SerPl Elph-Mcnc: 0.8 g/dL (ref 0.7–1.3)
Gamma Glob SerPl Elph-Mcnc: 0.6 g/dL (ref 0.4–1.8)
Globulin, Total: 2.3 g/dL (ref 2.2–3.9)
IgA: 91 mg/dL (ref 61–437)
IgG (Immunoglobin G), Serum: 626 mg/dL (ref 603–1613)
IgM (Immunoglobulin M), Srm: 28 mg/dL (ref 15–143)
M Protein SerPl Elph-Mcnc: 0.1 g/dL — ABNORMAL HIGH
Total Protein ELP: 5.7 g/dL — ABNORMAL LOW (ref 6.0–8.5)

## 2023-03-05 MED ORDER — TECHLITE PEN NEEDLES 32G X 4 MM MISC
1.0000 | Freq: Two times a day (BID) | 1 refills | Status: DC
  Filled 2023-03-05: qty 100, 50d supply, fill #0
  Filled 2023-05-14: qty 100, 50d supply, fill #1

## 2023-03-06 ENCOUNTER — Encounter: Payer: Self-pay | Admitting: Hematology

## 2023-03-06 ENCOUNTER — Other Ambulatory Visit: Payer: Self-pay

## 2023-03-07 ENCOUNTER — Other Ambulatory Visit: Payer: Self-pay

## 2023-03-09 ENCOUNTER — Encounter: Payer: Self-pay | Admitting: Physical Medicine and Rehabilitation

## 2023-03-09 ENCOUNTER — Encounter: Payer: 59 | Attending: Physical Medicine and Rehabilitation | Admitting: Physical Medicine and Rehabilitation

## 2023-03-09 ENCOUNTER — Other Ambulatory Visit: Payer: Self-pay

## 2023-03-09 VITALS — BP 115/72 | HR 82 | Ht 66.0 in | Wt 165.8 lb

## 2023-03-09 DIAGNOSIS — E1142 Type 2 diabetes mellitus with diabetic polyneuropathy: Secondary | ICD-10-CM

## 2023-03-09 DIAGNOSIS — M25511 Pain in right shoulder: Secondary | ICD-10-CM | POA: Diagnosis not present

## 2023-03-09 DIAGNOSIS — Z794 Long term (current) use of insulin: Secondary | ICD-10-CM

## 2023-03-09 DIAGNOSIS — N1831 Chronic kidney disease, stage 3a: Secondary | ICD-10-CM

## 2023-03-09 DIAGNOSIS — M48062 Spinal stenosis, lumbar region with neurogenic claudication: Secondary | ICD-10-CM

## 2023-03-09 DIAGNOSIS — G8929 Other chronic pain: Secondary | ICD-10-CM | POA: Diagnosis not present

## 2023-03-09 MED ORDER — LIDOCAINE 5 % EX PTCH
2.0000 | MEDICATED_PATCH | CUTANEOUS | 0 refills | Status: DC
  Filled 2023-03-09: qty 30, 15d supply, fill #0

## 2023-03-09 MED ORDER — TIZANIDINE HCL 2 MG PO CAPS
2.0000 mg | ORAL_CAPSULE | Freq: Three times a day (TID) | ORAL | 3 refills | Status: DC | PRN
  Filled 2023-03-09: qty 60, 20d supply, fill #0

## 2023-03-09 MED ORDER — TIZANIDINE HCL 4 MG PO TABS
2.0000 mg | ORAL_TABLET | Freq: Three times a day (TID) | ORAL | 3 refills | Status: DC | PRN
Start: 2023-03-09 — End: 2023-07-20
  Filled 2023-03-09: qty 60, 20d supply, fill #0
  Filled 2023-05-01 (×2): qty 60, 20d supply, fill #1
  Filled 2023-06-08 (×2): qty 60, 20d supply, fill #2
  Filled 2023-07-10: qty 60, 20d supply, fill #3

## 2023-03-09 NOTE — Addendum Note (Signed)
Addended by: Genice Rouge on: 03/09/2023 01:47 PM   Modules accepted: Orders

## 2023-03-09 NOTE — Patient Instructions (Signed)
Patient is a 77 year old male with hx of R shoulder arthroscopy due to pain, and multiple myeloma as well as DM- As1c <7.5 per pt-  and lumbar stenosis with neurogenic claudication. Also has CKD 3B  Here for new patient evaluation for chronic pain.   Pt needs to go back to Ortho for R shoulder injection vs Shoulder surgery.    2.  Cannot get surgery on back until stops smoking- says will try to quit.   3. Suggest nicorette gum to try and stop smoking.  Shouldn't be as hard to quit as if you were smoking 1-2 packs per day- so please try.    4. Educated patient that he has compression on the Spinal cord- and that's why is hurts when walks short distances- and make it hurt real bad- meds won't fix the fact needs surgery.    5. Willing to try and increase gabapentin-  However had last Cr of 1.60- so cannot increase gabapentin. Currently takes 300 mg 4x/day (300 mg in AM and afternoon and 600 mg nightly). Since kidneys are unhappy, would cause him to get confused to increase gabapentin.   6. Drink more water and avoid Anti-inflammatories- says drinks 60+ oz/day. Takes tylenol not Advil or Aleve. So don't have an easy way to improve CKD.   7. Call Ortho about back and shoulder injection- Asking about low back/Spinal injection- Ortho recommended one.  Explained the risk is rare to have anything to occur, but risk isn't 0%- but would be appropriate to do until can get spinal surgery.   8. Not interested in PT- physical therapy. Doesn't want to do even for balance. Said did for pain in past and didn't help.    9.  Demonstrated how to do- Try to get exercise ball from [employer]- and blow it up- until your knees at 90 degrees- sit on it instead of watching TV- to help balance- if gets bored, can lift 1 foot then the other off the floor and stay on ball- and then can lean over a little to 1 side then the other- at least 30 minutes/day. At least 5 days/week to help back pain and balance. Doesn't work if  doesn't do it.   10. Demonstrated exercises- Also stand at counter and stand on toes then heels- and go back and forth at counter in kitchen for ~ 3-5 minutes/day.    11. Try to get Lidoderm patches- for neck and back pain- has allodynia- increased pain with light touch- 2 patches at a time- - 12 hrs on; then have to take off for 12 hours. Will send in for insurance coverage.   12. Will send Tizanidine 2 mg 3x/day as needed for muscle spasms- # 60- with 3 refills- for muscle tightness.  Can make sleepy.    13. F/U in 3months- f/u on pain- in back, neck and R shoulder and poor balance.

## 2023-03-09 NOTE — Progress Notes (Signed)
Subjective:    Patient ID: Robert Sparks, male    DOB: 02/17/1946, 77 y.o.   MRN: [id number]  HPI Patient is a 77 year old male with hx of R shoulder arthroscopy due to pain, and multiple myeloma as well as DM <7.5 last time and lumbar stenosis with neurogenic claudication.   Here for new patient evaluation for chronic pain.   R shoulder, neck and  across back/low back band and L lateral hip-  giving him trouble.  Asking about shot in L hip.   Bothers him when walks- and gets tired when walks "too far" or when makes bed up in the morning.     Pt had been on Lyrica, but now on Gabapentin 300 mg in AM , afternoon and 600 mg at bedtime.    Has RTC injury of R shoulder-  Torn supraspinatus tendon- retracted- needs to have shoulder surgery.   Had injection of R shoulder lasted 6 months by Ortho.  Did >6 months ago- but just worn off.   MRI of neck done 12/08/2022 IMPRESSION: 1. No spinal canal stenosis. Mild left neural foraminal narrowing at C4-C5 and C5-C6. 2. Prominent anterior osteophytes at C4-C6, which indent on the posterior aspect of the hypopharynx and esophagus, with trace prevertebral edema.   Lumbar MRI 10/19/2022 IMPRESSION: 1. At L4-5 there is a broad-based disc bulge. Moderate bilateral facet arthropathy with a left facet effusion and bilateral ligamentum flavum infolding 10 x 8 x 18 mm cyst along the dorsal midline of the thecal sac between the ligamentum flavum with mass effect on the thecal sac. Mild resultant spinal stenosis. Mild bilateral foraminal stenosis. 2. At L3-4 there is a broad-based disc bulge. Mild bilateral facet arthropathy. Bilateral lateral recess stenosis. Mild left foraminal stenosis. 3. At L5-S1 there is a minimal broad-based disc bulge. Mild bilateral facet arthropathy with bilateral small facet effusions. Mild bilateral foraminal stenosis. 4. No acute osseous injury of the lumbar spine.      Asking about Spinal injection- Ortho  recommended one.  Explained the risk is rare to have anything to occur, but risk isn't 0%   Also has neuropathy in feet- and mainly sore- and dry in between toes.  Feet aren't burning.  Just sore on the bottom.   Also c/o balance being off- feels like will fall, but hasn't fallen, even without cane today, but uses sometimes.    Also has "crick in neck". Been a few moths.  Posterior neck "all the way around".  Aches sometimes- feels a little tight.   Still smoking- 1/3 ppd- but told cannot get spinal surgery until nicotine free.    Patches for smoking cessation made him have bad dreams.    Pt said, Not sure why they sent him here!   Pain Inventory Average Pain 5 Pain Right Now 0 My pain is intermittent  In the last 24 hours, has pain interfered with the following? General activity 4 Relation with others 4 Enjoyment of life 4 What TIME of day is your pain at its worst? evening Sleep (in general) Fair  Pain is worse with: walking, standing, and some activites Pain improves with: rest and medication Relief from Meds: 4  walk without assistance ability to climb steps?  yes do you drive?  yes  retired  bowel control problems numbness tingling dizziness  Any changes since last visit?  no  Any changes since last visit?  no    Family History  Problem Relation Age of Onset  Kidney disease Mother    Cancer Neg Hx    Colon cancer Neg Hx    Colon polyps Neg Hx    Esophageal cancer Neg Hx    Rectal cancer Neg Hx    Stomach cancer Neg Hx    Social History   Socioeconomic History   Marital status: Single    Spouse name: Not on file   Number of children: Not on file   Years of education: Not on file   Highest education level: Not on file  Occupational History   Not on file  Tobacco Use   Smoking status: Every Day    Current packs/day: 0.50    Average packs/day: 0.5 packs/day for 50.0 years (25.0 ttl pk-yrs)    Types: Cigarettes   Smokeless tobacco:  Never   Tobacco comments:    1 ppwk-4-5 a day   Vaping Use   Vaping status: Never Used  Substance and Sexual Activity   Alcohol use: Yes    Alcohol/week: 1.0 standard drink of alcohol    Types: 1 Cans of beer per week    Comment: 1 every day-quit couple weeks ago   Drug use: No   Sexual activity: Not Currently  Other Topics Concern   Not on file  Social History Narrative   Not on file   Social Determinants of Health   Financial Resource Strain: Low Risk  (11/03/2022)   Overall Financial Resource Strain (CARDIA)    Difficulty of Paying Living Expenses: Not hard at all  Food Insecurity: No Food Insecurity (09/15/2022)   Hunger Vital Sign    Worried About Running Out of Food in the Last Year: Never true    Ran Out of Food in the Last Year: Never true  Transportation Needs: No Transportation Needs (09/15/2022)   PRAPARE - Administrator, Civil Service (Medical): No    Lack of Transportation (Non-Medical): No  Physical Activity: Insufficiently Active (11/03/2022)   Exercise Vital Sign    Days of Exercise per Week: 1 day    Minutes of Exercise per Session: 20 min  Stress: No Stress Concern Present (11/03/2022)   [employer] of Occupational Health - Occupational Stress Questionnaire    Feeling of Stress : Not at all  Social Connections: Socially Isolated (11/03/2022)   Social Connection and Isolation Panel [NHANES]    Frequency of Communication with Friends and Family: Once a week    Frequency of Social Gatherings with Friends and Family: Once a week    Attends Religious Services: 1 to 4 times per year    Active Member of Clubs or Organizations: No    Attends Banker Meetings: Never    Marital Status: Never married   Past Surgical History:  Procedure Laterality Date   CIRCUMCISION  1990's   PROSTATE BIOPSY N/A 12/21/2015   Procedure: BIOPSY TRANSRECTAL ULTRASONIC PROSTATE (TUBP);  Surgeon: Jerilee Field, MD;  Location: [facility];   Service: Urology;  Laterality: N/A;   Past Medical History:  Diagnosis Date   Anemia    Arthritis    shoulders,feet    Cataract    per eye dr -has appt 5-11   CKD (chronic kidney disease), stage III (HCC)    Elevated PSA    History of ketoacidosis    03-29-2015    History of sepsis    07-25-2013  non-compliant w/ medication   Hyperlipidemia    Hypertension    Nocturia    Peripheral neuropathy  Prostate cancer (HCC)    Type 2 diabetes mellitus with insulin therapy (HCC)    BP 115/72   Pulse 82   Ht 5\' 6"  (1.676 m)   Wt 165 lb 12.8 oz (75.2 kg)   SpO2 98%   BMI 26.76 kg/m   Opioid Risk Score:   Fall Risk Score:  `1  Depression screen [facility] 2/9     03/09/2023   12:47 PM 01/09/2023   10:27 AM 09/07/2022    9:16 AM 05/08/2022    9:20 AM 01/02/2022   11:06 AM 10/10/2021   10:01 AM 08/29/2021   10:41 AM  Depression screen PHQ 2/9  Decreased Interest 0 0 0 0 0 0 3  Down, Depressed, Hopeless 0 0 0 0 0 0 0  PHQ - 2 Score 0 0 0 0 0 0 3  Altered sleeping 2 0 1  0  1  Tired, decreased energy 1 0 0  0  0  Change in appetite 0 0 0  0  0  Feeling bad or failure about yourself  0 0 0  3  0  Trouble concentrating 0 0 0  0  0  Moving slowly or fidgety/restless 0 0 0  0  0  Suicidal thoughts 0 0 0  0  0  PHQ-9 Score 3 0 1  3  4     Review of Systems  Constitutional: Negative.   HENT: Negative.    Eyes: Negative.   Respiratory: Negative.    Cardiovascular: Negative.   Gastrointestinal:  Positive for constipation and diarrhea.  Genitourinary: Negative.   Musculoskeletal:  Positive for back pain.       Shoulder pain  Skin: Negative.   Allergic/Immunologic: Negative.   Neurological:  Positive for dizziness and numbness.       Tingling  Hematological: Negative.   Psychiatric/Behavioral: Negative.    All other systems reviewed and are negative.      Objective:   Physical Exam   Awake, alert, slightly delayed responses, vague, NAD Slumped over lumbar flexion noted but  notes no pain right now  MS: 5/5 in Ue's and LE's except R shoulder deltoid 4+/5 TTP in band across low back- L4/5 level TTP with tight musculature in L buttock round piriformis Not TTP lateral hip- not trochanteric bursa Tight trigger points in scalenes, upper traps, supraspinatus, pecs, rhomboids and upper traps R>L    Neuro: Decreased in feet B/L- to light touch, but intact from ankles to legs B/L  Decreased balance when stands up- esp when puts feet together-  and almost fell over when closed eyes.      Assessment & Plan:   Patient is a 77 year old male with hx of R shoulder arthroscopy due to pain, and multiple myeloma as well as DM- As1c <7.5 per pt-  and lumbar stenosis with neurogenic claudication. Also has CKD 3B  Here for new patient evaluation for chronic pain.   Pt needs to go back to Ortho for R shoulder injection vs Shoulder surgery.    2.  Cannot get surgery on back until stops smoking- says will try to quit.   3. Suggest nicorette gum to try and stop smoking.  Shouldn't be as hard to quit as if you were smoking 1-2 packs per day- so please try.    4. Educated patient that he has compression on the Spinal cord- and that's why is hurts when walks short distances- and make it hurt real bad- meds won't fix the  fact needs surgery.    5. Willing to try and increase gabapentin-  However had last Cr of 1.60- so cannot increase gabapentin. Currently takes 300 mg 4x/day (300 mg in AM and afternoon and 600 mg nightly). Since kidneys are unhappy, would cause him to get confused to increase gabapentin.   6. Drink more water and avoid Anti-inflammatories- says drinks 60+ oz/day. Takes tylenol not Advil or Aleve. So don't have an easy way to improve CKD.   7. Call Ortho about back and shoulder injection- Asking about low back/Spinal injection- Ortho recommended one.  Explained the risk is rare to have anything to occur, but risk isn't 0%- but would be appropriate to do until  can get spinal surgery.   8. Not interested in PT- physical therapy. Doesn't want to do even for balance. Said did for pain in past and didn't help.    9.  Demonstrated how to do- Try to get exercise ball from [employer]- and blow it up- until your knees at 90 degrees- sit on it instead of watching TV- to help balance- if gets bored, can lift 1 foot then the other off the floor and stay on ball- and then can lean over a little to 1 side then the other- at least 30 minutes/day. At least 5 days/week to help back pain and balance. Doesn't work if doesn't do it.   10. Demonstrated exercises- Also stand at counter and stand on toes then heels- and go back and forth at counter in kitchen for ~ 3-5 minutes/day.    11. Try to get Lidoderm patches- for neck and back pain- has allodynia- increased pain with light touch- 2 patches at a time- - 12 hrs on; then have to take off for 12 hours. Will send in for insurance coverage.   12. Will send Tizanidine 2 mg 3x/day as needed for muscle spasms- # 60- with 3 refills- for muscle tightness.  Can make sleepy.    13. F/U in 3months- f/u on pain- in back, neck and R shoulder and poor balance.   I spent a total of 48   minutes on total care today- >50% coordination of care- due to prolonged visit on exercises, chronic pain, and how to address- as detailed above

## 2023-03-10 ENCOUNTER — Other Ambulatory Visit: Payer: Self-pay

## 2023-03-14 ENCOUNTER — Inpatient Hospital Stay: Payer: 59

## 2023-03-14 ENCOUNTER — Other Ambulatory Visit: Payer: Self-pay

## 2023-03-15 ENCOUNTER — Inpatient Hospital Stay: Payer: 59

## 2023-03-15 ENCOUNTER — Inpatient Hospital Stay: Payer: 59 | Attending: Hematology

## 2023-03-15 VITALS — BP 122/71 | HR 72 | Temp 98.3°F | Resp 14 | Wt 165.2 lb

## 2023-03-15 DIAGNOSIS — G620 Drug-induced polyneuropathy: Secondary | ICD-10-CM | POA: Diagnosis not present

## 2023-03-15 DIAGNOSIS — C9002 Multiple myeloma in relapse: Secondary | ICD-10-CM

## 2023-03-15 DIAGNOSIS — T451X5A Adverse effect of antineoplastic and immunosuppressive drugs, initial encounter: Secondary | ICD-10-CM | POA: Diagnosis not present

## 2023-03-15 DIAGNOSIS — I129 Hypertensive chronic kidney disease with stage 1 through stage 4 chronic kidney disease, or unspecified chronic kidney disease: Secondary | ICD-10-CM | POA: Diagnosis not present

## 2023-03-15 DIAGNOSIS — Z5112 Encounter for antineoplastic immunotherapy: Secondary | ICD-10-CM | POA: Diagnosis not present

## 2023-03-15 DIAGNOSIS — Z7189 Other specified counseling: Secondary | ICD-10-CM

## 2023-03-15 DIAGNOSIS — E1122 Type 2 diabetes mellitus with diabetic chronic kidney disease: Secondary | ICD-10-CM | POA: Insufficient documentation

## 2023-03-15 LAB — CBC WITH DIFFERENTIAL (CANCER CENTER ONLY)
Abs Immature Granulocytes: 0.01 10*3/uL (ref 0.00–0.07)
Basophils Absolute: 0.1 10*3/uL (ref 0.0–0.1)
Basophils Relative: 1 %
Eosinophils Absolute: 0.2 10*3/uL (ref 0.0–0.5)
Eosinophils Relative: 4 %
HCT: 29.8 % — ABNORMAL LOW (ref 39.0–52.0)
Hemoglobin: 10.5 g/dL — ABNORMAL LOW (ref 13.0–17.0)
Immature Granulocytes: 0 %
Lymphocytes Relative: 22 %
Lymphs Abs: 1 10*3/uL (ref 0.7–4.0)
MCH: 34.7 pg — ABNORMAL HIGH (ref 26.0–34.0)
MCHC: 35.2 g/dL (ref 30.0–36.0)
MCV: 98.3 fL (ref 80.0–100.0)
Monocytes Absolute: 0.3 10*3/uL (ref 0.1–1.0)
Monocytes Relative: 5 %
Neutro Abs: 3.2 10*3/uL (ref 1.7–7.7)
Neutrophils Relative %: 68 %
Platelet Count: 220 10*3/uL (ref 150–400)
RBC: 3.03 MIL/uL — ABNORMAL LOW (ref 4.22–5.81)
RDW: 15.2 % (ref 11.5–15.5)
WBC Count: 4.8 10*3/uL (ref 4.0–10.5)
nRBC: 0 % (ref 0.0–0.2)

## 2023-03-15 LAB — CMP (CANCER CENTER ONLY)
ALT: 18 U/L (ref 0–44)
AST: 15 U/L (ref 15–41)
Albumin: 3.9 g/dL (ref 3.5–5.0)
Alkaline Phosphatase: 56 U/L (ref 38–126)
Anion gap: 7 (ref 5–15)
BUN: 17 mg/dL (ref 8–23)
CO2: 25 mmol/L (ref 22–32)
Calcium: 9.2 mg/dL (ref 8.9–10.3)
Chloride: 106 mmol/L (ref 98–111)
Creatinine: 1.41 mg/dL — ABNORMAL HIGH (ref 0.61–1.24)
GFR, Estimated: 52 mL/min — ABNORMAL LOW (ref >60)
Glucose, Bld: 236 mg/dL — ABNORMAL HIGH (ref 70–99)
Potassium: 4.1 mmol/L (ref 3.5–5.1)
Sodium: 138 mmol/L (ref 135–145)
Total Bilirubin: 0.7 mg/dL (ref 0.3–1.2)
Total Protein: 6.1 g/dL — ABNORMAL LOW (ref 6.5–8.1)

## 2023-03-15 MED ORDER — DARATUMUMAB-HYALURONIDASE-FIHJ 1800-30000 MG-UT/15ML ~~LOC~~ SOLN
1800.0000 mg | Freq: Once | SUBCUTANEOUS | Status: AC
  Administered 2023-03-15: 1800 mg via SUBCUTANEOUS
  Filled 2023-03-15: qty 15

## 2023-03-15 MED ORDER — FAMOTIDINE 20 MG PO TABS
20.0000 mg | ORAL_TABLET | Freq: Once | ORAL | Status: AC
  Administered 2023-03-15: 20 mg via ORAL
  Filled 2023-03-15: qty 1

## 2023-03-15 MED ORDER — DIPHENHYDRAMINE HCL 25 MG PO CAPS
50.0000 mg | ORAL_CAPSULE | Freq: Once | ORAL | Status: AC
  Administered 2023-03-15: 50 mg via ORAL
  Filled 2023-03-15: qty 2

## 2023-03-15 MED ORDER — ACETAMINOPHEN 325 MG PO TABS
650.0000 mg | ORAL_TABLET | Freq: Once | ORAL | Status: AC
  Administered 2023-03-15: 650 mg via ORAL
  Filled 2023-03-15: qty 2

## 2023-03-15 MED ORDER — DEXAMETHASONE 4 MG PO TABS
20.0000 mg | ORAL_TABLET | Freq: Once | ORAL | Status: AC
  Administered 2023-03-15: 20 mg via ORAL
  Filled 2023-03-15: qty 5

## 2023-03-15 NOTE — Patient Instructions (Signed)
[facility]  Discharge Instructions: Thank you for choosing [facility] to provide your oncology and hematology care.   If you have a lab appointment with the Cancer Center, please go directly to the Cancer Center and check in at the registration area.   Wear comfortable clothing and clothing appropriate for easy access to any Portacath or PICC line.   We strive to give you quality time with your provider. You may need to reschedule your appointment if you arrive late (15 or more minutes).  Arriving late affects you and other patients whose appointments are after yours.  Also, if you miss three or more appointments without notifying the office, you may be dismissed from the clinic at the provider's discretion.      For prescription refill requests, have your pharmacy contact our office and allow 72 hours for refills to be completed.    Today you received the following chemotherapy and/or immunotherapy agents Daratumumab-Hyaluronidase (Darzalex Faspro)     To help prevent nausea and vomiting after your treatment, we encourage you to take your nausea medication as directed.  BELOW ARE SYMPTOMS THAT SHOULD BE REPORTED IMMEDIATELY: *FEVER GREATER THAN 100.4 F (38 C) OR HIGHER *CHILLS OR SWEATING *NAUSEA AND VOMITING THAT IS NOT CONTROLLED WITH YOUR NAUSEA MEDICATION *UNUSUAL SHORTNESS OF BREATH *UNUSUAL BRUISING OR BLEEDING *URINARY PROBLEMS (pain or burning when urinating, or frequent urination) *BOWEL PROBLEMS (unusual diarrhea, constipation, pain near the anus) TENDERNESS IN MOUTH AND THROAT WITH OR WITHOUT PRESENCE OF ULCERS (sore throat, sores in mouth, or a toothache) UNUSUAL RASH, SWELLING OR PAIN  UNUSUAL VAGINAL DISCHARGE OR ITCHING   Items with * indicate a potential emergency and should be followed up as soon as possible or go to the Emergency Department if any problems should occur.  Please show the CHEMOTHERAPY ALERT CARD or  IMMUNOTHERAPY ALERT CARD at check-in to the Emergency Department and triage nurse.  Should you have questions after your visit or need to cancel or reschedule your appointment, please contact [facility]  Dept: [phone]  and follow the prompts.  Office hours are 8:00 a.m. to 4:30 p.m. Monday - Friday. Please note that voicemails left after 4:00 p.m. may not be returned until the following business day.  We are closed weekends and major holidays. You have access to a nurse at all times for urgent questions. Please call the main number to the clinic Dept: [phone] and follow the prompts.   For any non-urgent questions, you may also contact your provider using MyChart. We now offer e-Visits for anyone 34 and older to request care online for non-urgent symptoms. For details visit [email or web address].   Also download the MyChart app! Go to the app store, search "MyChart", open the app, select [facility], and log in with your MyChart username and password.

## 2023-03-15 NOTE — Progress Notes (Signed)
Pt observed for 2 hours post first time Darzalex Faspro injection. Pt tolerated Tx well w/out incident. VSS at discharge.  Ambulatory w/ cane to lobby.

## 2023-03-16 ENCOUNTER — Other Ambulatory Visit: Payer: Self-pay

## 2023-03-16 DIAGNOSIS — C9002 Multiple myeloma in relapse: Secondary | ICD-10-CM

## 2023-03-16 MED ORDER — POMALIDOMIDE 2 MG PO CAPS: 2.0000 mg | ORAL_CAPSULE | Freq: Every day | ORAL | 0 refills | Status: DC

## 2023-03-26 ENCOUNTER — Other Ambulatory Visit: Payer: Self-pay

## 2023-03-28 ENCOUNTER — Inpatient Hospital Stay: Payer: 59

## 2023-03-28 ENCOUNTER — Inpatient Hospital Stay (HOSPITAL_BASED_OUTPATIENT_CLINIC_OR_DEPARTMENT_OTHER): Payer: 59 | Admitting: Physician Assistant

## 2023-03-28 ENCOUNTER — Ambulatory Visit: Payer: 59

## 2023-03-28 ENCOUNTER — Ambulatory Visit: Payer: 59 | Admitting: Hematology

## 2023-03-28 VITALS — BP 141/62 | HR 70 | Temp 97.7°F | Resp 20 | Wt 166.9 lb

## 2023-03-28 VITALS — BP 156/68 | HR 68 | Temp 98.2°F | Resp 16

## 2023-03-28 DIAGNOSIS — Z5112 Encounter for antineoplastic immunotherapy: Secondary | ICD-10-CM | POA: Diagnosis not present

## 2023-03-28 DIAGNOSIS — Z7189 Other specified counseling: Secondary | ICD-10-CM

## 2023-03-28 DIAGNOSIS — C9002 Multiple myeloma in relapse: Secondary | ICD-10-CM

## 2023-03-28 DIAGNOSIS — G620 Drug-induced polyneuropathy: Secondary | ICD-10-CM | POA: Diagnosis not present

## 2023-03-28 DIAGNOSIS — I129 Hypertensive chronic kidney disease with stage 1 through stage 4 chronic kidney disease, or unspecified chronic kidney disease: Secondary | ICD-10-CM | POA: Diagnosis not present

## 2023-03-28 DIAGNOSIS — E1122 Type 2 diabetes mellitus with diabetic chronic kidney disease: Secondary | ICD-10-CM | POA: Diagnosis not present

## 2023-03-28 DIAGNOSIS — T451X5A Adverse effect of antineoplastic and immunosuppressive drugs, initial encounter: Secondary | ICD-10-CM | POA: Diagnosis not present

## 2023-03-28 LAB — CMP (CANCER CENTER ONLY)
ALT: 14 U/L (ref 0–44)
AST: 12 U/L — ABNORMAL LOW (ref 15–41)
Albumin: 4 g/dL (ref 3.5–5.0)
Alkaline Phosphatase: 62 U/L (ref 38–126)
Anion gap: 5 (ref 5–15)
BUN: 20 mg/dL (ref 8–23)
CO2: 27 mmol/L (ref 22–32)
Calcium: 9.1 mg/dL (ref 8.9–10.3)
Chloride: 105 mmol/L (ref 98–111)
Creatinine: 1.57 mg/dL — ABNORMAL HIGH (ref 0.61–1.24)
GFR, Estimated: 46 mL/min — ABNORMAL LOW (ref >60)
Glucose, Bld: 227 mg/dL — ABNORMAL HIGH (ref 70–99)
Potassium: 4.3 mmol/L (ref 3.5–5.1)
Sodium: 137 mmol/L (ref 135–145)
Total Bilirubin: 0.8 mg/dL (ref 0.3–1.2)
Total Protein: 6.4 g/dL — ABNORMAL LOW (ref 6.5–8.1)

## 2023-03-28 LAB — CBC WITH DIFFERENTIAL (CANCER CENTER ONLY)
Abs Immature Granulocytes: 0 10*3/uL (ref 0.00–0.07)
Basophils Absolute: 0 10*3/uL (ref 0.0–0.1)
Basophils Relative: 1 %
Eosinophils Absolute: 0.1 10*3/uL (ref 0.0–0.5)
Eosinophils Relative: 2 %
HCT: 28.5 % — ABNORMAL LOW (ref 39.0–52.0)
Hemoglobin: 10.1 g/dL — ABNORMAL LOW (ref 13.0–17.0)
Immature Granulocytes: 0 %
Lymphocytes Relative: 29 %
Lymphs Abs: 1.1 10*3/uL (ref 0.7–4.0)
MCH: 34.8 pg — ABNORMAL HIGH (ref 26.0–34.0)
MCHC: 35.4 g/dL (ref 30.0–36.0)
MCV: 98.3 fL (ref 80.0–100.0)
Monocytes Absolute: 0.6 10*3/uL (ref 0.1–1.0)
Monocytes Relative: 15 %
Neutro Abs: 2.1 10*3/uL (ref 1.7–7.7)
Neutrophils Relative %: 53 %
Platelet Count: 173 10*3/uL (ref 150–400)
RBC: 2.9 MIL/uL — ABNORMAL LOW (ref 4.22–5.81)
RDW: 14.7 % (ref 11.5–15.5)
WBC Count: 4 10*3/uL (ref 4.0–10.5)
nRBC: 0 % (ref 0.0–0.2)

## 2023-03-28 MED ORDER — DIPHENHYDRAMINE HCL 25 MG PO CAPS
50.0000 mg | ORAL_CAPSULE | Freq: Once | ORAL | Status: AC
  Administered 2023-03-28: 50 mg via ORAL
  Filled 2023-03-28: qty 2

## 2023-03-28 MED ORDER — DARATUMUMAB-HYALURONIDASE-FIHJ 1800-30000 MG-UT/15ML ~~LOC~~ SOLN
1800.0000 mg | Freq: Once | SUBCUTANEOUS | Status: AC
  Administered 2023-03-28: 1800 mg via SUBCUTANEOUS
  Filled 2023-03-28: qty 15

## 2023-03-28 MED ORDER — ACETAMINOPHEN 325 MG PO TABS
650.0000 mg | ORAL_TABLET | Freq: Once | ORAL | Status: AC
  Administered 2023-03-28: 650 mg via ORAL
  Filled 2023-03-28: qty 2

## 2023-03-28 MED ORDER — FAMOTIDINE 20 MG PO TABS
20.0000 mg | ORAL_TABLET | Freq: Once | ORAL | Status: AC
  Administered 2023-03-28: 20 mg via ORAL
  Filled 2023-03-28: qty 1

## 2023-03-28 MED ORDER — DEXAMETHASONE 4 MG PO TABS
20.0000 mg | ORAL_TABLET | Freq: Once | ORAL | Status: AC
  Administered 2023-03-28: 20 mg via ORAL
  Filled 2023-03-28: qty 5

## 2023-03-28 NOTE — Progress Notes (Signed)
HEMATOLOGY/ONCOLOGY CLINIC NOTE  Date of Service: 03/28/23  Patient Care Team: Marcine Matar, MD as PCP - General (Internal Medicine)  CHIEF COMPLAINTS/PURPOSE OF CONSULTATION:  Multiple myeloma  CURRENT TREATMENT: Dara/Pomalyst/Dex  INTERVAL HISTORY:   Robert Robert Sparks is a 77 y.o. male is here for continued evaluation and management of his relapsed multiple myeloma. Patient was last seen by Dr. Candise Che on 02/28/2023.  Robert. Sparks reports that he is tolerating daratumumab and pomalyst treatment without any new or concerning symptoms. His energy levels and appetite are overall stable.  He denies nausea, vomiting or abdominal pain.  His bowel habits are overall stable with occasional episodes of constipation versus diarrhea. He does have some dizziness when he goes from sitting to standing.  He denies easy bruising or signs of active bleeding.  Patient does have persistent neuropathy in his feet that is unchanged from prior.  He denies fevers, chills, sweats, shortness of breath, chest pain or cough.  He has no other complaints.   MEDICAL HISTORY:  Past Medical History:  Diagnosis Date   Anemia    Arthritis    shoulders,feet    Cataract    per eye dr -has appt 5-11   CKD (chronic kidney disease), stage III (HCC)    Elevated PSA    History of ketoacidosis    03-29-2015    History of sepsis    07-25-2013  non-compliant w/ medication   Hyperlipidemia    Hypertension    Nocturia    Peripheral neuropathy    Prostate cancer (HCC)    Type 2 diabetes mellitus with insulin therapy [facility])     SURGICAL HISTORY: Past Surgical History:  Procedure Laterality Date   CIRCUMCISION  1990's   PROSTATE BIOPSY N/A 12/21/2015   Procedure: BIOPSY TRANSRECTAL ULTRASONIC PROSTATE (TUBP);  Surgeon: Jerilee Field, MD;  Location: [facility];  Service: Urology;  Laterality: N/A;    SOCIAL HISTORY: Social History   Socioeconomic History   Marital status: Single     Spouse name: Not on file   Number of children: Not on file   Years of education: Not on file   Highest education level: Not on file  Occupational History   Not on file  Tobacco Use   Smoking status: Every Day    Current packs/day: 0.50    Average packs/day: 0.5 packs/day for 50.0 years (25.0 ttl pk-yrs)    Types: Cigarettes   Smokeless tobacco: Never   Tobacco comments:    1 ppwk-4-5 a day   Vaping Use   Vaping status: Never Used  Substance and Sexual Activity   Alcohol use: Yes    Alcohol/week: 1.0 standard drink of alcohol    Types: 1 Cans of beer per week    Comment: 1 every day-quit couple weeks ago   Drug use: No   Sexual activity: Not Currently  Other Topics Concern   Not on file  Social History Narrative   Not on file   Social Determinants of Health   Financial Resource Strain: Low Risk  (11/03/2022)   Overall Financial Resource Strain (CARDIA)    Difficulty of Paying Living Expenses: Not hard at all  Food Insecurity: No Food Insecurity (09/15/2022)   Hunger Vital Sign    Worried About Running Out of Food in the Last Year: Never true    Ran Out of Food in the Last Year: Never true  Transportation Needs: No Transportation Needs (09/15/2022)   PRAPARE - Transportation  Lack of Transportation (Medical): No    Lack of Transportation (Non-Medical): No  Physical Activity: Insufficiently Active (11/03/2022)   Exercise Vital Sign    Days of Exercise per Week: 1 day    Minutes of Exercise per Session: 20 min  Stress: No Stress Concern Present (11/03/2022)   [employer] of Occupational Health - Occupational Stress Questionnaire    Feeling of Stress : Not at all  Social Connections: Socially Isolated (11/03/2022)   Social Connection and Isolation Panel [NHANES]    Frequency of Communication with Friends and Family: Once a week    Frequency of Social Gatherings with Friends and Family: Once a week    Attends Religious Services: 1 to 4 times per year    Active Member of  [employer] or Organizations: No    Attends Engineer, structural: Never    Marital Status: Never married  Catering manager Violence: Not on file    FAMILY HISTORY: Family History  Problem Relation Age of Onset   Kidney disease Mother    Cancer Neg Hx    Colon cancer Neg Hx    Colon polyps Neg Hx    Esophageal cancer Neg Hx    Rectal cancer Neg Hx    Stomach cancer Neg Hx     ALLERGIES:  is allergic to other and lisinopril.  MEDICATIONS:  Current Outpatient Medications  Medication Sig Dispense Refill   Accu-Chek Softclix Lancets lancets Use to test blood sugar 3 times daily. 100 each 3   acetaminophen (TYLENOL) 500 MG tablet Take 2 tablets (1,000 mg total) by mouth every 6 (six) hours as needed. 30 tablet 0   acyclovir (ZOVIRAX) 400 MG tablet Take 1 tablet (400 mg total) by mouth 2 (two) times daily. 60 tablet 11   amLODipine (NORVASC) 10 MG tablet Take 1 tablet (10 mg total) by mouth daily. 90 tablet 1   aspirin EC 81 MG tablet Take 81 mg by mouth daily. Swallow whole.     b complex vitamins capsule Take 1 capsule by mouth daily. 30 capsule 5   Blood Glucose Monitoring Suppl (ACCU-CHEK GUIDE) w/Device KIT Use to test blood sugar three times daily 1 kit 0   Continuous Blood Gluc Receiver (FREESTYLE LIBRE READER) DEVI Use to check blood sugar 3x daily. E11.42 1 each 0   Continuous Blood Gluc Sensor (FREESTYLE LIBRE SENSOR SYSTEM) MISC Use to check blood sugar 3 times daily. Change sensor every 2 weeks. 2 each 12   dexamethasone (DECADRON) 4 MG tablet 20mg  (5 tabs) Take the day after each dose of daratumumab. Take with breakfast 20 tablet 11   diclofenac Sodium (VOLTAREN) 1 % GEL Apply 4 g topically 4 (four) times daily. 150 g 5   donepezil (ARICEPT) 5 MG tablet TAKE 1 TABLET (5 MG TOTAL) BY MOUTH AT BEDTIME. FOR MEMORY ISSUES 30 tablet 5   ferrous sulfate 325 (65 FE) MG tablet Take 325 mg by mouth daily.     furosemide (LASIX) 20 MG tablet Take 1 tablet (20 mg total) by mouth  daily in the morning for 7 days (until 09/08/22). Save remaining tablets. (Patient not taking: Reported on 03/09/2023) 30 tablet 0   gabapentin (NEURONTIN) 300 MG capsule Take 1 capsule (300 mg total) by mouth every morning AND 1 capsule (300 mg total) daily at 12 noon AND 2 capsules (600 mg total) at bedtime. Stop Lyrica. 120 capsule 11   glucose blood (ACCU-CHEK GUIDE) test strip Use to test blood sugar 3 times daily.  100 each 2   hydrOXYzine (ATARAX) 25 MG tablet TAKE 1 TABLET (25 MG TOTAL) BY MOUTH 3 (THREE) TIMES DAILY AS NEEDED FOR ITCHING (RASH). 30 tablet 0   Insulin Lispro Prot & Lispro (HUMALOG MIX 75/25 KWIKPEN) (75-25) 100 UNIT/ML Kwikpen Inject 8 units subcutaneously with breakfast and dinner. Increase to 10 units for 3-5 days twice a month after dexamethasone. 15 mL 11   Insulin Pen Needle (TECHLITE PEN NEEDLES) 32G X 4 MM MISC USE AS DIRECTED TWICE DAILY WITH INSULIN 100 each 1   ketoconazole (NIZORAL) 2 % cream Apply to skin of feet twice daily 60 g 2   lidocaine (LIDODERM) 5 % Place 2 patches onto the skin daily. 12 hours on; 12 hours off!!! Can put on neck or back for pain- Remove & Discard patch within 12 hours or as directed by MD 30 patch 0   Miconazole Nitrate (ATHLETES FOOT POWDER SPRAY) 2 % AERP Spray between toes and under toes once daily. 130 g 4   Multiple Vitamin (MULTIVITAMIN WITH MINERALS) TABS tablet Take 1 tablet by mouth daily. 60 tablet 2   ondansetron (ZOFRAN) 8 MG tablet Take 1 tablet (8 mg total) by mouth 2 (two) times daily as needed (Nausea or vomiting). (Patient not taking: Reported on 03/09/2023) 30 tablet 1   polyethylene glycol (MIRALAX) 17 g packet Take 17 g by mouth daily. 30 each 2   pomalidomide (POMALYST) 2 MG capsule Take 1 capsule (2 mg total) by mouth daily. 3 weeks on 1 week off 21 capsule 0   potassium chloride SA (KLOR-CON M) 20 MEQ tablet Take 1 tablet (20 mEq total) by mouth daily. 30 tablet 1   pravastatin (PRAVACHOL) 20 MG tablet Take 1 tablet (20  mg total) by mouth daily. 90 tablet 1   prochlorperazine (COMPAZINE) 10 MG tablet Take 1 tablet (10 mg total) by mouth every 6 (six) hours as needed (Nausea or vomiting). 30 tablet 1   tadalafil (CIALIS) 20 MG tablet Take 20 mg by mouth as needed.     tiZANidine (ZANAFLEX) 4 MG tablet Take 0.5-1 tablets (2-4 mg total) by mouth every 8 (eight) hours as needed for muscle spasms. 60 tablet 3   valsartan (DIOVAN) 40 MG tablet Take 1 tablet (40 mg total) by mouth daily. STOP LISINOPRIL 90 tablet 3   vitamin B-12 (CYANOCOBALAMIN) 100 MCG tablet Take 100 mcg by mouth daily.     No current facility-administered medications for this visit.    REVIEW OF SYSTEMS:  10 Point review of Systems was done is negative except as noted above.   PHYSICAL EXAMINATION: .BP (!) 141/62   Pulse 70   Temp 97.7 F (36.5 C)   Resp 20   Wt 166 lb 14.4 oz (75.7 kg)   SpO2 100%   BMI 26.94 kg/m  GENERAL:alert, in no acute distress and comfortable SKIN: no acute rashes, no significant lesions EYES: conjunctiva are pink and non-injected, sclera anicteric LUNGS: clear to auscultation b/l with normal respiratory effort HEART: regular rate & rhythm Extremity: no pedal edema PSYCH: alert & oriented x 3 with fluent speech NEURO: no focal motor/sensory deficits   LABORATORY DATA:  I have reviewed the data as listed  .    Latest Ref Rng & Units 03/28/2023   11:14 AM 03/15/2023   11:56 AM 02/28/2023   10:45 AM  CBC  WBC 4.0 - 10.5 K/uL 4.0  4.8  3.8   Hemoglobin 13.0 - 17.0 g/dL 18.8  41.6  60.6  Hematocrit 39.0 - 52.0 % 28.5  29.8  29.8   Platelets 150 - 400 K/uL 173  220  232       Latest Ref Rng & Units 03/15/2023   11:56 AM 02/28/2023   10:45 AM 01/30/2023   10:34 AM  CMP  Glucose 70 - 99 mg/dL 425  956  387   BUN 8 - 23 mg/dL 17  15  11    Creatinine 0.61 - 1.24 mg/dL 5.64  3.32  9.51   Sodium 135 - 145 mmol/L 138  137  136   Potassium 3.5 - 5.1 mmol/L 4.1  3.9  4.1   Chloride 98 - 111 mmol/L 106  106   107   CO2 22 - 32 mmol/L 25  25  21    Calcium 8.9 - 10.3 mg/dL 9.2  9.3  9.5   Total Protein 6.5 - 8.1 g/dL 6.1  6.6  6.1   Total Bilirubin 0.3 - 1.2 mg/dL 0.7  0.5  0.5   Alkaline Phos 38 - 126 U/L 56  62  40   AST 15 - 41 U/L 15  15  16    ALT 0 - 44 U/L 18  14  15       08/29/17 BM Bx:    08/29/17 Cytogenetics:   03/13/17 Cytogenetics:        PATHOLOGY Surgical Pathology  CASE: [id number]  PATIENT: Robert Sparks  Bone Marrow Report      Clinical History: Multiple myeloma in relapse (HCC) ,(BH)      DIAGNOSIS:   BONE MARROW, ASPIRATE, CLOT, CORE:  -Hypercellular bone marrow for age with plasma cell neoplasm  -See comment   PERIPHERAL BLOOD:  -Mild normocytic-normochromic anemia  -Eosinophilia   COMMENT:   The bone marrow is hypercellular for age with increased number of plasma  cells representing 8% of all cells in the aspirate associated with  numerous small clusters in the clot and biopsy sections. The plasma  cells display lambda light chain restriction consistent with plasma cell  neoplasm.  The background shows trilineage hematopoiesis with generally  nonspecific myeloid changes, likely secondary in nature in this setting.  Correlation with cytogenetic and FISH studies is recommended.      RADIOGRAPHIC STUDIES: I have personally reviewed the radiological images as listed and agreed with the findings in the report. No results found.  ASSESSMENT & PLAN:  Robert Sparks is a 77 y.o. who presents for continued management of multiple myeloma.   1.  Relapsed IgA Lambda Multiple Myeloma ISS Stage I  -Originally diagnosed based on presence of anemia, kidney insufficiency, and paraproteinemia with significant predominance of lambda light chains as well as significant elevation of IgA. -Received induction Velcade/Revlimid/Dexamethasone.  Had issues with skin rashes with Revlimid and this was discontinued.  Completed bortezomib dexamethasone induction  and achieved complete remission. -Transitioned to maintenance Velcade 2 weeks which was then switched to maintenance Ninlaro.Patient had issues with worsening neuropathy which was a combination of Velcade and his diabetes.  Ninlaro was discontinued as well after maintenance of more than 2 years.  2.  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 Bone marrow biopsy shows 8% lambda restricted plasma cells PET CT scan with no hypermetabolic osseous lesions  3.  Hypertension 4.  Diabetes type 2 5 .chronic kidney disease due to hypertension and diabetes. 6.  Peripheral neuropathy related to diabetes and previous myeloma treatments.  PLAN: -  Due for darazalex SQ injection today. -Labs today reviewed and require no intervention. WBC 4.0, Hgb 10.1, Plt 173. Creatinine stable at 1.57. Calcium normal. LFTs in range.  -Most recent myeloma labs from 02/28/2023 showed overall stable M protein 0.1 g/dL. -Proceed with treatment today. Continue on pomalyst. No dose modifications.   FOLLOW-UP: Per integrated scheduling. Biweekly daratumumab treatment with labs and follow up every 4 weeks.   All of the patient's questions were answered with apparent satisfaction. The patient knows to call the clinic with any problems, questions or concerns.  I have spent a total of 30 minutes minutes of face-to-face and non-face-to-face time, preparing to see the patient, performing a medically appropriate examination, counseling and educating the patient, documenting clinical information in the electronic health record, and care coordination.   Georga Kaufmann PA-C Dept of Hematology and Oncology [facility] Phone: [phone]

## 2023-03-28 NOTE — Progress Notes (Signed)
Pt observed for 60 minutes post second Darzalex Faspro injection. Pt tolerated Tx well w/out incident. VSS at discharge.  Ambulatory w/ cane to lobby.

## 2023-03-28 NOTE — Patient Instructions (Signed)
[place] [facility]  Discharge Instructions: Thank you for choosing [facility] to provide your oncology and hematology care.   If you have a lab appointment with the Cancer Center, please go directly to the Cancer Center and check in at the registration area.   Wear comfortable clothing and clothing appropriate for easy access to any Portacath or PICC line.   We strive to give you quality time with your provider. You may need to reschedule your appointment if you arrive late (15 or more minutes).  Arriving late affects you and other patients whose appointments are after yours.  Also, if you miss three or more appointments without notifying the office, you may be dismissed from the clinic at the provider's discretion.      For prescription refill requests, have your pharmacy contact our office and allow 72 hours for refills to be completed.    Today you received the following chemotherapy and/or immunotherapy agents Daratumumab-Hyaluronidase (Darzalex Faspro)     To help prevent nausea and vomiting after your treatment, we encourage you to take your nausea medication as directed.  BELOW ARE SYMPTOMS THAT SHOULD BE REPORTED IMMEDIATELY: *FEVER GREATER THAN 100.4 F (38 C) OR HIGHER *CHILLS OR SWEATING *NAUSEA AND VOMITING THAT IS NOT CONTROLLED WITH YOUR NAUSEA MEDICATION *UNUSUAL SHORTNESS OF BREATH *UNUSUAL BRUISING OR BLEEDING *URINARY PROBLEMS (pain or burning when urinating, or frequent urination) *BOWEL PROBLEMS (unusual diarrhea, constipation, pain near the anus) TENDERNESS IN MOUTH AND THROAT WITH OR WITHOUT PRESENCE OF ULCERS (sore throat, sores in mouth, or a toothache) UNUSUAL RASH, SWELLING OR PAIN  UNUSUAL VAGINAL DISCHARGE OR ITCHING   Items with * indicate a potential emergency and should be followed up as soon as possible or go to the Emergency Department if any problems should occur.  Please show the CHEMOTHERAPY ALERT CARD or  IMMUNOTHERAPY ALERT CARD at check-in to the Emergency Department and triage nurse.  Should you have questions after your visit or need to cancel or reschedule your appointment, please contact [place] [facility]  Dept: [phone]  and follow the prompts.  Office hours are 8:00 a.m. to 4:30 p.m. Monday - Friday. Please note that voicemails left after 4:00 p.m. may not be returned until the following business day.  We are closed weekends and major holidays. You have access to a nurse at all times for urgent questions. Please call the main number to the clinic Dept: [phone] and follow the prompts.   For any non-urgent questions, you may also contact your provider using MyChart. We now offer e-Visits for anyone 57 and older to request care online for non-urgent symptoms. For details visit [email or web address].   Also download the MyChart app! Go to the app store, search "MyChart", open the app, select [facility], and log in with your MyChart username and password.

## 2023-03-29 ENCOUNTER — Other Ambulatory Visit: Payer: Self-pay

## 2023-03-30 ENCOUNTER — Other Ambulatory Visit: Payer: Self-pay

## 2023-04-02 ENCOUNTER — Other Ambulatory Visit: Payer: Self-pay

## 2023-04-09 DIAGNOSIS — N1831 Chronic kidney disease, stage 3a: Secondary | ICD-10-CM | POA: Diagnosis not present

## 2023-04-11 ENCOUNTER — Inpatient Hospital Stay: Payer: 59

## 2023-04-11 ENCOUNTER — Inpatient Hospital Stay: Payer: 59 | Attending: Hematology

## 2023-04-11 VITALS — BP 135/68 | HR 69 | Resp 16 | Wt 167.0 lb

## 2023-04-11 DIAGNOSIS — Z5112 Encounter for antineoplastic immunotherapy: Secondary | ICD-10-CM | POA: Diagnosis not present

## 2023-04-11 DIAGNOSIS — Z7189 Other specified counseling: Secondary | ICD-10-CM

## 2023-04-11 DIAGNOSIS — R768 Other specified abnormal immunological findings in serum: Secondary | ICD-10-CM | POA: Insufficient documentation

## 2023-04-11 DIAGNOSIS — R197 Diarrhea, unspecified: Secondary | ICD-10-CM | POA: Insufficient documentation

## 2023-04-11 DIAGNOSIS — C9002 Multiple myeloma in relapse: Secondary | ICD-10-CM | POA: Diagnosis not present

## 2023-04-11 DIAGNOSIS — E1122 Type 2 diabetes mellitus with diabetic chronic kidney disease: Secondary | ICD-10-CM | POA: Insufficient documentation

## 2023-04-11 DIAGNOSIS — D892 Hypergammaglobulinemia, unspecified: Secondary | ICD-10-CM | POA: Insufficient documentation

## 2023-04-11 DIAGNOSIS — E1142 Type 2 diabetes mellitus with diabetic polyneuropathy: Secondary | ICD-10-CM | POA: Insufficient documentation

## 2023-04-11 DIAGNOSIS — I129 Hypertensive chronic kidney disease with stage 1 through stage 4 chronic kidney disease, or unspecified chronic kidney disease: Secondary | ICD-10-CM | POA: Diagnosis not present

## 2023-04-11 DIAGNOSIS — N183 Chronic kidney disease, stage 3 unspecified: Secondary | ICD-10-CM | POA: Insufficient documentation

## 2023-04-11 DIAGNOSIS — D649 Anemia, unspecified: Secondary | ICD-10-CM | POA: Insufficient documentation

## 2023-04-11 LAB — CBC WITH DIFFERENTIAL (CANCER CENTER ONLY)
Abs Immature Granulocytes: 0.02 10*3/uL (ref 0.00–0.07)
Basophils Absolute: 0.1 10*3/uL (ref 0.0–0.1)
Basophils Relative: 1 %
Eosinophils Absolute: 0.2 10*3/uL (ref 0.0–0.5)
Eosinophils Relative: 5 %
HCT: 28.7 % — ABNORMAL LOW (ref 39.0–52.0)
Hemoglobin: 9.6 g/dL — ABNORMAL LOW (ref 13.0–17.0)
Immature Granulocytes: 1 %
Lymphocytes Relative: 20 %
Lymphs Abs: 0.7 10*3/uL (ref 0.7–4.0)
MCH: 33.3 pg (ref 26.0–34.0)
MCHC: 33.4 g/dL (ref 30.0–36.0)
MCV: 99.7 fL (ref 80.0–100.0)
Monocytes Absolute: 0.3 10*3/uL (ref 0.1–1.0)
Monocytes Relative: 7 %
Neutro Abs: 2.5 10*3/uL (ref 1.7–7.7)
Neutrophils Relative %: 66 %
Platelet Count: 282 10*3/uL (ref 150–400)
RBC: 2.88 MIL/uL — ABNORMAL LOW (ref 4.22–5.81)
RDW: 14.4 % (ref 11.5–15.5)
WBC Count: 3.7 10*3/uL — ABNORMAL LOW (ref 4.0–10.5)
nRBC: 0 % (ref 0.0–0.2)

## 2023-04-11 LAB — CMP (CANCER CENTER ONLY)
ALT: 16 U/L (ref 0–44)
AST: 16 U/L (ref 15–41)
Albumin: 3.9 g/dL (ref 3.5–5.0)
Alkaline Phosphatase: 64 U/L (ref 38–126)
Anion gap: 8 (ref 5–15)
BUN: 14 mg/dL (ref 8–23)
CO2: 25 mmol/L (ref 22–32)
Calcium: 9.6 mg/dL (ref 8.9–10.3)
Chloride: 105 mmol/L (ref 98–111)
Creatinine: 1.39 mg/dL — ABNORMAL HIGH (ref 0.61–1.24)
GFR, Estimated: 53 mL/min — ABNORMAL LOW (ref >60)
Glucose, Bld: 199 mg/dL — ABNORMAL HIGH (ref 70–99)
Potassium: 3.6 mmol/L (ref 3.5–5.1)
Sodium: 138 mmol/L (ref 135–145)
Total Bilirubin: 0.8 mg/dL (ref 0.3–1.2)
Total Protein: 6.8 g/dL (ref 6.5–8.1)

## 2023-04-11 MED ORDER — DEXAMETHASONE 4 MG PO TABS
20.0000 mg | ORAL_TABLET | Freq: Once | ORAL | Status: AC
  Administered 2023-04-11: 20 mg via ORAL
  Filled 2023-04-11: qty 5

## 2023-04-11 MED ORDER — FAMOTIDINE 20 MG PO TABS
20.0000 mg | ORAL_TABLET | Freq: Once | ORAL | Status: AC
  Administered 2023-04-11: 20 mg via ORAL
  Filled 2023-04-11: qty 1

## 2023-04-11 MED ORDER — DARATUMUMAB-HYALURONIDASE-FIHJ 1800-30000 MG-UT/15ML ~~LOC~~ SOLN
1800.0000 mg | Freq: Once | SUBCUTANEOUS | Status: AC
  Administered 2023-04-11: 1800 mg via SUBCUTANEOUS
  Filled 2023-04-11: qty 15

## 2023-04-11 MED ORDER — ACETAMINOPHEN 325 MG PO TABS
650.0000 mg | ORAL_TABLET | Freq: Once | ORAL | Status: AC
  Administered 2023-04-11: 650 mg via ORAL
  Filled 2023-04-11: qty 2

## 2023-04-11 MED ORDER — DIPHENHYDRAMINE HCL 25 MG PO CAPS
50.0000 mg | ORAL_CAPSULE | Freq: Once | ORAL | Status: AC
  Administered 2023-04-11: 50 mg via ORAL
  Filled 2023-04-11: qty 2

## 2023-04-11 NOTE — Patient Instructions (Signed)
[facility]  Discharge Instructions: Thank you for choosing [facility] to provide your oncology and hematology care.   If you have a lab appointment with the Cancer Center, please go directly to the Cancer Center and check in at the registration area.   Wear comfortable clothing and clothing appropriate for easy access to any Portacath or PICC line.   We strive to give you quality time with your provider. You may need to reschedule your appointment if you arrive late (15 or more minutes).  Arriving late affects you and other patients whose appointments are after yours.  Also, if you miss three or more appointments without notifying the office, you may be dismissed from the clinic at the provider's discretion.      For prescription refill requests, have your pharmacy contact our office and allow 72 hours for refills to be completed.    Today you received the following chemotherapy and/or immunotherapy agents Daratumumab-Hyaluronidase (Darzalex Faspro)     To help prevent nausea and vomiting after your treatment, we encourage you to take your nausea medication as directed.  BELOW ARE SYMPTOMS THAT SHOULD BE REPORTED IMMEDIATELY: *FEVER GREATER THAN 100.4 F (38 C) OR HIGHER *CHILLS OR SWEATING *NAUSEA AND VOMITING THAT IS NOT CONTROLLED WITH YOUR NAUSEA MEDICATION *UNUSUAL SHORTNESS OF BREATH *UNUSUAL BRUISING OR BLEEDING *URINARY PROBLEMS (pain or burning when urinating, or frequent urination) *BOWEL PROBLEMS (unusual diarrhea, constipation, pain near the anus) TENDERNESS IN MOUTH AND THROAT WITH OR WITHOUT PRESENCE OF ULCERS (sore throat, sores in mouth, or a toothache) UNUSUAL RASH, SWELLING OR PAIN  UNUSUAL VAGINAL DISCHARGE OR ITCHING   Items with * indicate a potential emergency and should be followed up as soon as possible or go to the Emergency Department if any problems should occur.  Please show the CHEMOTHERAPY ALERT CARD or  IMMUNOTHERAPY ALERT CARD at check-in to the Emergency Department and triage nurse.  Should you have questions after your visit or need to cancel or reschedule your appointment, please contact [facility]  Dept: [phone]  and follow the prompts.  Office hours are 8:00 a.m. to 4:30 p.m. Monday - Friday. Please note that voicemails left after 4:00 p.m. may not be returned until the following business day.  We are closed weekends and major holidays. You have access to a nurse at all times for urgent questions. Please call the main number to the clinic Dept: [phone] and follow the prompts.   For any non-urgent questions, you may also contact your provider using MyChart. We now offer e-Visits for anyone 34 and older to request care online for non-urgent symptoms. For details visit [email or web address].   Also download the MyChart app! Go to the app store, search "MyChart", open the app, select [facility], and log in with your MyChart username and password.

## 2023-04-13 LAB — KAPPA/LAMBDA LIGHT CHAINS
Kappa free light chain: 54.5 mg/L — ABNORMAL HIGH (ref 3.3–19.4)
Kappa, lambda light chain ratio: 1.65 (ref 0.26–1.65)
Lambda free light chains: 33.1 mg/L — ABNORMAL HIGH (ref 5.7–26.3)

## 2023-04-15 LAB — MULTIPLE MYELOMA PANEL, SERUM
Albumin SerPl Elph-Mcnc: 3.6 g/dL (ref 2.9–4.4)
Albumin/Glob SerPl: 1.6 (ref 0.7–1.7)
Alpha 1: 0.3 g/dL (ref 0.0–0.4)
Alpha2 Glob SerPl Elph-Mcnc: 0.8 g/dL (ref 0.4–1.0)
B-Globulin SerPl Elph-Mcnc: 0.8 g/dL (ref 0.7–1.3)
Gamma Glob SerPl Elph-Mcnc: 0.5 g/dL (ref 0.4–1.8)
Globulin, Total: 2.4 g/dL (ref 2.2–3.9)
IgA: 116 mg/dL (ref 61–437)
IgG (Immunoglobin G), Serum: 606 mg/dL (ref 603–1613)
IgM (Immunoglobulin M), Srm: 34 mg/dL (ref 15–143)
M Protein SerPl Elph-Mcnc: 0.2 g/dL — ABNORMAL HIGH
Total Protein ELP: 6 g/dL (ref 6.0–8.5)

## 2023-04-18 ENCOUNTER — Other Ambulatory Visit: Payer: Self-pay | Admitting: Internal Medicine

## 2023-04-18 ENCOUNTER — Other Ambulatory Visit: Payer: Self-pay

## 2023-04-18 DIAGNOSIS — E1142 Type 2 diabetes mellitus with diabetic polyneuropathy: Secondary | ICD-10-CM

## 2023-04-18 MED ORDER — ACCU-CHEK GUIDE VI STRP
ORAL_STRIP | 2 refills | Status: DC
  Filled 2023-04-18: qty 100, fill #0
  Filled 2023-04-20: qty 100, 33d supply, fill #0

## 2023-04-20 ENCOUNTER — Other Ambulatory Visit: Payer: Self-pay

## 2023-04-20 DIAGNOSIS — C9002 Multiple myeloma in relapse: Secondary | ICD-10-CM

## 2023-04-20 MED ORDER — POMALIDOMIDE 2 MG PO CAPS: 2.0000 mg | ORAL_CAPSULE | Freq: Every day | ORAL | 0 refills | Status: DC

## 2023-04-25 ENCOUNTER — Inpatient Hospital Stay: Payer: 59

## 2023-04-25 ENCOUNTER — Inpatient Hospital Stay: Payer: 59 | Admitting: Hematology

## 2023-04-25 VITALS — BP 136/61 | HR 73 | Temp 97.7°F | Resp 18 | Wt 168.6 lb

## 2023-04-25 DIAGNOSIS — D892 Hypergammaglobulinemia, unspecified: Secondary | ICD-10-CM | POA: Diagnosis not present

## 2023-04-25 DIAGNOSIS — I129 Hypertensive chronic kidney disease with stage 1 through stage 4 chronic kidney disease, or unspecified chronic kidney disease: Secondary | ICD-10-CM | POA: Diagnosis not present

## 2023-04-25 DIAGNOSIS — E1122 Type 2 diabetes mellitus with diabetic chronic kidney disease: Secondary | ICD-10-CM | POA: Diagnosis not present

## 2023-04-25 DIAGNOSIS — D649 Anemia, unspecified: Secondary | ICD-10-CM | POA: Diagnosis not present

## 2023-04-25 DIAGNOSIS — Z7189 Other specified counseling: Secondary | ICD-10-CM

## 2023-04-25 DIAGNOSIS — Z5112 Encounter for antineoplastic immunotherapy: Secondary | ICD-10-CM | POA: Diagnosis not present

## 2023-04-25 DIAGNOSIS — R197 Diarrhea, unspecified: Secondary | ICD-10-CM | POA: Diagnosis not present

## 2023-04-25 DIAGNOSIS — R768 Other specified abnormal immunological findings in serum: Secondary | ICD-10-CM | POA: Diagnosis not present

## 2023-04-25 DIAGNOSIS — C9002 Multiple myeloma in relapse: Secondary | ICD-10-CM

## 2023-04-25 DIAGNOSIS — E1142 Type 2 diabetes mellitus with diabetic polyneuropathy: Secondary | ICD-10-CM | POA: Diagnosis not present

## 2023-04-25 DIAGNOSIS — N183 Chronic kidney disease, stage 3 unspecified: Secondary | ICD-10-CM | POA: Diagnosis not present

## 2023-04-25 LAB — CBC WITH DIFFERENTIAL (CANCER CENTER ONLY)
Abs Immature Granulocytes: 0.01 10*3/uL (ref 0.00–0.07)
Basophils Absolute: 0.1 10*3/uL (ref 0.0–0.1)
Basophils Relative: 2 %
Eosinophils Absolute: 0.2 10*3/uL (ref 0.0–0.5)
Eosinophils Relative: 7 %
HCT: 28 % — ABNORMAL LOW (ref 39.0–52.0)
Hemoglobin: 9.5 g/dL — ABNORMAL LOW (ref 13.0–17.0)
Immature Granulocytes: 0 %
Lymphocytes Relative: 30 %
Lymphs Abs: 1.1 10*3/uL (ref 0.7–4.0)
MCH: 34.1 pg — ABNORMAL HIGH (ref 26.0–34.0)
MCHC: 33.9 g/dL (ref 30.0–36.0)
MCV: 100.4 fL — ABNORMAL HIGH (ref 80.0–100.0)
Monocytes Absolute: 0.5 10*3/uL (ref 0.1–1.0)
Monocytes Relative: 15 %
Neutro Abs: 1.7 10*3/uL (ref 1.7–7.7)
Neutrophils Relative %: 46 %
Platelet Count: 205 10*3/uL (ref 150–400)
RBC: 2.79 MIL/uL — ABNORMAL LOW (ref 4.22–5.81)
RDW: 15.2 % (ref 11.5–15.5)
WBC Count: 3.6 10*3/uL — ABNORMAL LOW (ref 4.0–10.5)
nRBC: 0 % (ref 0.0–0.2)

## 2023-04-25 MED ORDER — DIPHENHYDRAMINE HCL 25 MG PO CAPS
50.0000 mg | ORAL_CAPSULE | Freq: Once | ORAL | Status: AC
  Administered 2023-04-25: 50 mg via ORAL
  Filled 2023-04-25: qty 2

## 2023-04-25 MED ORDER — DARATUMUMAB-HYALURONIDASE-FIHJ 1800-30000 MG-UT/15ML ~~LOC~~ SOLN
1800.0000 mg | Freq: Once | SUBCUTANEOUS | Status: AC
  Administered 2023-04-25: 1800 mg via SUBCUTANEOUS
  Filled 2023-04-25: qty 15

## 2023-04-25 MED ORDER — FAMOTIDINE 20 MG PO TABS
20.0000 mg | ORAL_TABLET | Freq: Once | ORAL | Status: AC
  Administered 2023-04-25: 20 mg via ORAL
  Filled 2023-04-25: qty 1

## 2023-04-25 MED ORDER — DEXAMETHASONE 4 MG PO TABS
20.0000 mg | ORAL_TABLET | Freq: Once | ORAL | Status: AC
  Administered 2023-04-25: 20 mg via ORAL
  Filled 2023-04-25: qty 5

## 2023-04-25 MED ORDER — ACETAMINOPHEN 325 MG PO TABS
650.0000 mg | ORAL_TABLET | Freq: Once | ORAL | Status: AC
  Administered 2023-04-25: 650 mg via ORAL
  Filled 2023-04-25: qty 2

## 2023-04-25 NOTE — Progress Notes (Signed)
HEMATOLOGY/ONCOLOGY CLINIC NOTE  Date of Service: 04/25/23   Patient Care Team: Marcine Matar, MD as PCP - General (Internal Medicine)  CHIEF COMPLAINTS/PURPOSE OF CONSULTATION:  Follow-up for continued evaluation and management of relapsed multiple myeloma  Oncologic History:  77 y.o. male with diagnosis of IgA lambda active multiple myeloma, ISS Stage I. Active disease diagnosed based on presence of anemia, kidney insufficiency, and paraproteinemia with significant predominance of lambda light chains as well as significant elevation of IgA. Bone marrow biopsy confirmed presence of atypical monoclonal plasma cell process in the bone marrow comprising at least 7% of the cellularity. Based on the findings, patient was started on treatment with lenalidomide, bortezomib, and low-dose dexamethasone based on the anticipated tolerance by the patient. Lenalidomide was started at 10 mg daily based on creatinine clearance of 42, dexamethasone dose was reduced to 20 mg weekly based on patient's age. Treatment was complicated by rapid development of cutaneous rash attributed to lenalidomide, inaddition to decreased renal function and now patient has persistent severe anemia. Side-effects were attributed to Lenalidomide and lenalidomide was discontinued with subsequent recovery.  Patient has been receiving bortezomib weekly with low-dose dexamethasone in 4-day cycles.   Patient has completed induction systemic therapy for his disease with repeat bone marrow biopsy confirming complete response including no evidence of minimal residual disease by cytogenetics or FISH.  HISTORY OF PRESENTING ILLNESS:  Please see previous note for details of initial presentation.  [name] is a 77 y.o. male is here for continued evaluation and management of his relapsed multiple myeloma. Patient was last seen by me on 02/28/2023 and complained of neck, shoulder, and back pain, especially  when walking. He also reported stable night sweats, dizziness, and stable neuropathy.   Patient was most recently seen by Thayil, PA on 03/28/2023 and reported occasional episodes of constipation versus diarrhea. He also reported dizziness, persistent stable neuropathy in his feet.  Today, he reports that he has been doing well overall since his last clinical visit. Patient has been tolerating Pomalidomide well with no toxicities. He denies any new bone pain, abdominal pain, new leg swelling, SOB, chest pain, or infection issues.   He reports having a loss of balance sometimes and does use a cane sometimes. Patient reports mild tingling/numbness which improves. Patient continues to have arthritis in his right knee with intermittent discomfort. He denies any redness or swelling.   He does endorse diarrhea 1-2 times a day.  Patient denies any new changes in his medications. He is UTD with his flu, COVID-19, pneumonia, and shingles vaccines.   MEDICAL HISTORY:  Past Medical History:  Diagnosis Date   Anemia    Arthritis    shoulders,feet    Cataract    per eye dr -has appt 5-11   CKD (chronic kidney disease), stage III (HCC)    Elevated PSA    History of ketoacidosis    03-29-2015    History of sepsis    07-25-2013  non-compliant w/ medication   Hyperlipidemia    Hypertension    Nocturia    Peripheral neuropathy    Prostate cancer (HCC)    Type 2 diabetes mellitus with insulin therapy [facility])     SURGICAL HISTORY: Past Surgical History:  Procedure Laterality Date   CIRCUMCISION  1990's   PROSTATE BIOPSY N/A 12/21/2015   Procedure: BIOPSY TRANSRECTAL ULTRASONIC PROSTATE (TUBP);  Surgeon: Jerilee Field, MD;  Location: [facility];  Service: Urology;  Laterality: N/A;    SOCIAL HISTORY: Social History   Socioeconomic History   Marital status: Single    Spouse name: Not on file   Number of children: Not on file   Years of education: Not on file   Highest  education level: Not on file  Occupational History   Not on file  Tobacco Use   Smoking status: Every Day    Current packs/day: 0.50    Average packs/day: 0.5 packs/day for 50.0 years (25.0 ttl pk-yrs)    Types: Cigarettes   Smokeless tobacco: Never   Tobacco comments:    1 ppwk-4-5 a day   Vaping Use   Vaping status: Never Used  Substance and Sexual Activity   Alcohol use: Yes    Alcohol/week: 1.0 standard drink of alcohol    Types: 1 Cans of beer per week    Comment: 1 every day-quit couple weeks ago   Drug use: No   Sexual activity: Not Currently  Other Topics Concern   Not on file  Social History Narrative   Not on file   Social Determinants of Health   Financial Resource Strain: Low Risk  (11/03/2022)   Overall Financial Resource Strain (CARDIA)    Difficulty of Paying Living Expenses: Not hard at all  Food Insecurity: No Food Insecurity (09/15/2022)   Hunger Vital Sign    Worried About Running Out of Food in the Last Year: Never true    Ran Out of Food in the Last Year: Never true  Transportation Needs: No Transportation Needs (09/15/2022)   PRAPARE - Administrator, Civil Service (Medical): No    Lack of Transportation (Non-Medical): No  Physical Activity: Insufficiently Active (11/03/2022)   Exercise Vital Sign    Days of Exercise per Week: 1 day    Minutes of Exercise per Session: 20 min  Stress: No Stress Concern Present (11/03/2022)   [employer] of Occupational Health - Occupational Stress Questionnaire    Feeling of Stress : Not at all  Social Connections: Socially Isolated (11/03/2022)   Social Connection and Isolation Panel [NHANES]    Frequency of Communication with Friends and Family: Once a week    Frequency of Social Gatherings with Friends and Family: Once a week    Attends Religious Services: 1 to 4 times per year    Active Member of [employer] or Organizations: No    Attends Engineer, structural: Never    Marital Status: Never  married  Catering manager Violence: Not on file    FAMILY HISTORY: Family History  Problem Relation Age of Onset   Kidney disease Mother    Cancer Neg Hx    Colon cancer Neg Hx    Colon polyps Neg Hx    Esophageal cancer Neg Hx    Rectal cancer Neg Hx    Stomach cancer Neg Hx     ALLERGIES:  is allergic to other and lisinopril.  MEDICATIONS:  Current Outpatient Medications  Medication Sig Dispense Refill   Accu-Chek Softclix Lancets lancets Use to test blood sugar 3 times daily. 100 each 3   acetaminophen (TYLENOL) 500 MG tablet Take 2 tablets (1,000 mg total) by mouth every 6 (six) hours as needed. 30 tablet 0   acyclovir (ZOVIRAX) 400 MG tablet Take 1 tablet (400 mg total) by mouth 2 (two) times daily. 60 tablet 11   amLODipine (NORVASC) 10 MG tablet Take 1 tablet (10 mg total) by mouth daily. 90 tablet 1  aspirin EC 81 MG tablet Take 81 mg by mouth daily. Swallow whole.     b complex vitamins capsule Take 1 capsule by mouth daily. 30 capsule 5   Blood Glucose Monitoring Suppl (ACCU-CHEK GUIDE) w/Device KIT Use to test blood sugar three times daily 1 kit 0   Continuous Blood Gluc Receiver (FREESTYLE LIBRE READER) DEVI Use to check blood sugar 3x daily. E11.42 1 each 0   Continuous Blood Gluc Sensor (FREESTYLE LIBRE SENSOR SYSTEM) MISC Use to check blood sugar 3 times daily. Change sensor every 2 weeks. 2 each 12   dexamethasone (DECADRON) 4 MG tablet 20mg  (5 tabs) Take the day after each dose of daratumumab. Take with breakfast 20 tablet 11   diclofenac Sodium (VOLTAREN) 1 % GEL Apply 4 g topically 4 (four) times daily. 150 g 5   donepezil (ARICEPT) 5 MG tablet TAKE 1 TABLET (5 MG TOTAL) BY MOUTH AT BEDTIME. FOR MEMORY ISSUES 30 tablet 5   ferrous sulfate 325 (65 FE) MG tablet Take 325 mg by mouth daily.     furosemide (LASIX) 20 MG tablet Take 1 tablet (20 mg total) by mouth daily in the morning for 7 days (until 09/08/22). Save remaining tablets. (Patient not taking: Reported on  03/09/2023) 30 tablet 0   gabapentin (NEURONTIN) 300 MG capsule Take 1 capsule (300 mg total) by mouth every morning AND 1 capsule (300 mg total) daily at 12 noon AND 2 capsules (600 mg total) at bedtime. Stop Lyrica. 120 capsule 11   glucose blood (ACCU-CHEK GUIDE) test strip Use to test blood sugar 3 times daily. 100 each 2   hydrOXYzine (ATARAX) 25 MG tablet TAKE 1 TABLET (25 MG TOTAL) BY MOUTH 3 (THREE) TIMES DAILY AS NEEDED FOR ITCHING (RASH). 30 tablet 0   Insulin Lispro Prot & Lispro (HUMALOG MIX 75/25 KWIKPEN) (75-25) 100 UNIT/ML Kwikpen Inject 8 units subcutaneously with breakfast and dinner. Increase to 10 units for 3-5 days twice a month after dexamethasone. 15 mL 11   Insulin Pen Needle (TECHLITE PEN NEEDLES) 32G X 4 MM MISC USE AS DIRECTED TWICE DAILY WITH INSULIN 100 each 1   ketoconazole (NIZORAL) 2 % cream Apply to skin of feet twice daily 60 g 2   lidocaine (LIDODERM) 5 % Place 2 patches onto the skin daily. 12 hours on; 12 hours off!!! Can put on neck or back for pain- Remove & Discard patch within 12 hours or as directed by MD 30 patch 0   Miconazole Nitrate (ATHLETES FOOT POWDER SPRAY) 2 % AERP Spray between toes and under toes once daily. 130 g 4   Multiple Vitamin (MULTIVITAMIN WITH MINERALS) TABS tablet Take 1 tablet by mouth daily. 60 tablet 2   ondansetron (ZOFRAN) 8 MG tablet Take 1 tablet (8 mg total) by mouth 2 (two) times daily as needed (Nausea or vomiting). (Patient not taking: Reported on 03/09/2023) 30 tablet 1   polyethylene glycol (MIRALAX) 17 g packet Take 17 g by mouth daily. 30 each 2   pomalidomide (POMALYST) 2 MG capsule Take 1 capsule (2 mg total) by mouth daily. 3 weeks on 1 week off 21 capsule 0   potassium chloride SA (KLOR-CON M) 20 MEQ tablet Take 1 tablet (20 mEq total) by mouth daily. 30 tablet 1   pravastatin (PRAVACHOL) 20 MG tablet Take 1 tablet (20 mg total) by mouth daily. 90 tablet 1   prochlorperazine (COMPAZINE) 10 MG tablet Take 1 tablet (10 mg  total) by mouth every 6 (six)  hours as needed (Nausea or vomiting). 30 tablet 1   tadalafil (CIALIS) 20 MG tablet Take 20 mg by mouth as needed.     tiZANidine (ZANAFLEX) 4 MG tablet Take 0.5-1 tablets (2-4 mg total) by mouth every 8 (eight) hours as needed for muscle spasms. 60 tablet 3   valsartan (DIOVAN) 40 MG tablet Take 1 tablet (40 mg total) by mouth daily. STOP LISINOPRIL 90 tablet 3   vitamin B-12 (CYANOCOBALAMIN) 100 MCG tablet Take 100 mcg by mouth daily.     No current facility-administered medications for this visit.    REVIEW OF SYSTEMS:  10 Point review of Systems was done is negative except as noted above.   PHYSICAL EXAMINATION: .BP 136/61   Pulse 73   Temp 97.7 F (36.5 C)   Resp 18   Wt 168 lb 9.6 oz (76.5 kg)   SpO2 100%   BMI 27.21 kg/m   GENERAL:alert, in no acute distress and comfortable SKIN: no acute rashes, no significant lesions EYES: conjunctiva are pink and non-injected, sclera anicteric OROPHARYNX: MMM, no exudates, no oropharyngeal erythema or ulceration NECK: supple, no JVD LYMPH:  no palpable lymphadenopathy in the cervical, axillary or inguinal regions LUNGS: clear to auscultation b/l with normal respiratory effort HEART: regular rate & rhythm ABDOMEN:  normoactive bowel sounds , non tender, not distended. Extremity: no pedal edema PSYCH: alert & oriented x 3 with fluent speech NEURO: no focal motor/sensory deficits   LABORATORY DATA:  I have reviewed the data as listed  .    Latest Ref Rng & Units 04/25/2023    9:23 AM 04/11/2023    8:53 AM 03/28/2023   11:14 AM  CBC  WBC 4.0 - 10.5 K/uL 3.6  3.7  4.0   Hemoglobin 13.0 - 17.0 g/dL 9.5  9.6  73.2   Hematocrit 39.0 - 52.0 % 28.0  28.7  28.5   Platelets 150 - 400 K/uL 205  282  173       Latest Ref Rng & Units 04/11/2023    8:53 AM 03/28/2023   11:51 AM 03/15/2023   11:56 AM  CMP  Glucose 70 - 99 mg/dL 202  542  706   BUN 8 - 23 mg/dL 14  20  17    Creatinine 0.61 - 1.24 mg/dL 2.37   6.28  3.15   Sodium 135 - 145 mmol/L 138  137  138   Potassium 3.5 - 5.1 mmol/L 3.6  4.3  4.1   Chloride 98 - 111 mmol/L 105  105  106   CO2 22 - 32 mmol/L 25  27  25    Calcium 8.9 - 10.3 mg/dL 9.6  9.1  9.2   Total Protein 6.5 - 8.1 g/dL 6.8  6.4  6.1   Total Bilirubin 0.3 - 1.2 mg/dL 0.8  0.8  0.7   Alkaline Phos 38 - 126 U/L 64  62  56   AST 15 - 41 U/L 16  12  15    ALT 0 - 44 U/L 16  14  18       08/29/17 BM Bx:    08/29/17 Cytogenetics:   03/13/17 Cytogenetics:        PATHOLOGY Surgical Pathology  CASE: [id number]  PATIENT: Robert Sparks  Bone Marrow Report      Clinical History: Multiple myeloma in relapse (HCC) ,(BH)      DIAGNOSIS:   BONE MARROW, ASPIRATE, CLOT, CORE:  -Hypercellular bone marrow for age with plasma cell neoplasm  -See  comment   PERIPHERAL BLOOD:  -Mild normocytic-normochromic anemia  -Eosinophilia   COMMENT:   The bone marrow is hypercellular for age with increased number of plasma  cells representing 8% of all cells in the aspirate associated with  numerous small clusters in the clot and biopsy sections. The plasma  cells display lambda light chain restriction consistent with plasma cell  neoplasm.  The background shows trilineage hematopoiesis with generally  nonspecific myeloid changes, likely secondary in nature in this setting.  Correlation with cytogenetic and FISH studies is recommended.      RADIOGRAPHIC STUDIES: I have personally reviewed the radiological images as listed and agreed with the findings in the report. No results found.  ASSESSMENT & PLAN:   78 y.o. male with   1.  Relapsed IgA Lambda Multiple Myeloma ISS Stage I    Active disease was previously diagnosed based on presence of anemia, kidney insufficiency, and paraproteinemia with significant predominance of lambda light chains as well as significant elevation of IgA. Patient is status post patient was treated with induction bortezomib Revlimid  dexamethasone.  Had issues with skin rashes with Revlimid and this was discontinued.  Completed bortezomib dexamethasone induction and achieved complete remission. Was on maintenance Velcade 2 weeks which was then switched to maintenance Ninlaro. Patient had issues with worsening neuropathy which was a combination of Velcade and his diabetes.  Ninlaro was discontinued as well after maintenance of more than 2 years.  2.  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 Bone marrow biopsy shows 8% lambda restricted plasma cells PET CT scan with no hypermetabolic osseous lesions  3.  Hypertension 4.  Diabetes type 2 5 .chronic kidney disease due to hypertension and diabetes. 6.  Peripheral neuropathy related to diabetes and previous myeloma treatments.  PLAN:  -Discussed lab results on 04/25/23 in detail with patient. CBC stable, showed WBC of 3.6K, hemoglobin of 9.5, and platelets of 205K. -mild anemia, no other major changes -Patient has stable CKD. Her kidney numbers and calcium levels are stable -last myeloma panel from two weeks ago showed stable M protein level of 0.2 g/dL, which is igG kappa -no clinical sign or evidence of myeloma progression at this time  -patient has no toxicities with Daratumumab and Pomalidomide.  -continue current daratumumab/pomalidomide/dexamethasone treatment -continue daratumumab every two weeks -recommend patient to use a cane regularly to prevent any falls  FOLLOW-UP: Per integrated scheduling  The total time spent in the appointment was 30 minutes* .  All of the patient's questions were answered with apparent satisfaction. The patient knows to call the clinic with any problems, questions or concerns.   Wyvonnia Lora MD MS AAHIVMS [facility] Hematology/Oncology Physician [facility]  .*Total Encounter Time as defined by the Centers for Medicare and Medicaid  Services includes, in addition to the face-to-face time of a patient visit (documented in the note above) non-face-to-face time: obtaining and reviewing outside history, ordering and reviewing medications, tests or procedures, care coordination (communications with other health care professionals or caregivers) and documentation in the medical record.    I,Mitra Faeizi,acting as a Neurosurgeon for Wyvonnia Lora, MD.,have documented all relevant documentation on the behalf of Wyvonnia Lora, MD,as directed by  Wyvonnia Lora, MD while in the presence of Wyvonnia Lora, MD.  .I have reviewed the above documentation for accuracy and completeness, and I agree with the above. Johney Maine MD

## 2023-04-25 NOTE — Progress Notes (Signed)
Patient seen by Dr. Addison Naegeli are within treatment parameters.  Labs reviewed: and are within treatment parameters.  Per physician team, patient is ready for treatment and there are NO modifications to the treatment plan.

## 2023-04-25 NOTE — Patient Instructions (Signed)
[place] [facility]  Discharge Instructions: Thank you for choosing [facility] to provide your oncology and hematology care.   If you have a lab appointment with the Cancer Center, please go directly to the Cancer Center and check in at the registration area.   Wear comfortable clothing and clothing appropriate for easy access to any Portacath or PICC line.   We strive to give you quality time with your provider. You may need to reschedule your appointment if you arrive late (15 or more minutes).  Arriving late affects you and other patients whose appointments are after yours.  Also, if you miss three or more appointments without notifying the office, you may be dismissed from the clinic at the provider's discretion.      For prescription refill requests, have your pharmacy contact our office and allow 72 hours for refills to be completed.    Today you received the following chemotherapy and/or immunotherapy agents Darzalex Faspro      To help prevent nausea and vomiting after your treatment, we encourage you to take your nausea medication as directed.  BELOW ARE SYMPTOMS THAT SHOULD BE REPORTED IMMEDIATELY: *FEVER GREATER THAN 100.4 F (38 C) OR HIGHER *CHILLS OR SWEATING *NAUSEA AND VOMITING THAT IS NOT CONTROLLED WITH YOUR NAUSEA MEDICATION *UNUSUAL SHORTNESS OF BREATH *UNUSUAL BRUISING OR BLEEDING *URINARY PROBLEMS (pain or burning when urinating, or frequent urination) *BOWEL PROBLEMS (unusual diarrhea, constipation, pain near the anus) TENDERNESS IN MOUTH AND THROAT WITH OR WITHOUT PRESENCE OF ULCERS (sore throat, sores in mouth, or a toothache) UNUSUAL RASH, SWELLING OR PAIN  UNUSUAL VAGINAL DISCHARGE OR ITCHING   Items with * indicate a potential emergency and should be followed up as soon as possible or go to the Emergency Department if any problems should occur.  Please show the CHEMOTHERAPY ALERT CARD or IMMUNOTHERAPY ALERT CARD at  check-in to the Emergency Department and triage nurse.  Should you have questions after your visit or need to cancel or reschedule your appointment, please contact [place] [facility]  Dept: [phone]  and follow the prompts.  Office hours are 8:00 a.m. to 4:30 p.m. Monday - Friday. Please note that voicemails left after 4:00 p.m. may not be returned until the following business day.  We are closed weekends and major holidays. You have access to a nurse at all times for urgent questions. Please call the main number to the clinic Dept: [phone] and follow the prompts.   For any non-urgent questions, you may also contact your provider using MyChart. We now offer e-Visits for anyone 60 and older to request care online for non-urgent symptoms. For details visit [email or web address].   Also download the MyChart app! Go to the app store, search "MyChart", open the app, select [place], and log in with your MyChart username and password.

## 2023-04-28 ENCOUNTER — Other Ambulatory Visit: Payer: Self-pay

## 2023-04-30 ENCOUNTER — Ambulatory Visit (INDEPENDENT_AMBULATORY_CARE_PROVIDER_SITE_OTHER): Payer: 59 | Admitting: Podiatry

## 2023-04-30 ENCOUNTER — Encounter: Payer: Self-pay | Admitting: Podiatry

## 2023-04-30 DIAGNOSIS — M79674 Pain in right toe(s): Secondary | ICD-10-CM | POA: Diagnosis not present

## 2023-04-30 DIAGNOSIS — E1142 Type 2 diabetes mellitus with diabetic polyneuropathy: Secondary | ICD-10-CM

## 2023-04-30 DIAGNOSIS — Q828 Other specified congenital malformations of skin: Secondary | ICD-10-CM | POA: Diagnosis not present

## 2023-04-30 DIAGNOSIS — M79675 Pain in left toe(s): Secondary | ICD-10-CM

## 2023-04-30 DIAGNOSIS — B351 Tinea unguium: Secondary | ICD-10-CM

## 2023-04-30 NOTE — Progress Notes (Signed)
This patient presents to the office with chief complaint of long thick painful nails.  Patient says the nails are painful walking and wearing shoes.  This patient is unable to self treat.  This patient is unable to trim his nails since he is unable to reach his nails.  She presents to the office for preventative foot care services.  General Appearance  Alert, conversant and in no acute stress.  Vascular  Dorsalis pedis and posterior tibial  pulses are palpable  bilaterally.  Capillary return is within normal limits  bilaterally. Temperature is within normal limits  bilaterally.  Neurologic  Senn-Weinstein monofilament wire test within normal limits  bilaterally. Muscle power within normal limits bilaterally.  Nails Thick disfigured discolored nails with subungual debris  from hallux to fifth toes bilaterally. No evidence of bacterial infection or drainage bilaterally.  Orthopedic  No limitations of motion  feet .  No crepitus or effusions noted.  No bony pathology or digital deformities noted.  Skin  normotropic skin with no porokeratosis noted bilaterally.  No signs of infections or ulcers noted.   Callus plantar right hallux.  Onychomycosis  Nails  B/L.  Pain in right toes  Pain in left toes  Callus right hallux.  Debridement of nails both feet followed trimming the nails with dremel tool.  Debride callus with # 15 blade and dremel tool.  RTC 10 weeks    Helane Gunther DPM

## 2023-05-01 ENCOUNTER — Encounter: Payer: Self-pay | Admitting: Hematology

## 2023-05-01 ENCOUNTER — Other Ambulatory Visit: Payer: Self-pay

## 2023-05-01 ENCOUNTER — Other Ambulatory Visit (HOSPITAL_BASED_OUTPATIENT_CLINIC_OR_DEPARTMENT_OTHER): Payer: Self-pay

## 2023-05-07 ENCOUNTER — Other Ambulatory Visit: Payer: Self-pay

## 2023-05-09 ENCOUNTER — Inpatient Hospital Stay: Payer: 59

## 2023-05-09 ENCOUNTER — Other Ambulatory Visit: Payer: 59

## 2023-05-09 ENCOUNTER — Ambulatory Visit: Payer: 59 | Admitting: Hematology

## 2023-05-09 ENCOUNTER — Ambulatory Visit: Payer: 59

## 2023-05-09 VITALS — BP 132/62 | HR 64 | Temp 97.7°F | Resp 18 | Wt 167.4 lb

## 2023-05-09 DIAGNOSIS — C9002 Multiple myeloma in relapse: Secondary | ICD-10-CM

## 2023-05-09 DIAGNOSIS — D649 Anemia, unspecified: Secondary | ICD-10-CM | POA: Diagnosis not present

## 2023-05-09 DIAGNOSIS — I129 Hypertensive chronic kidney disease with stage 1 through stage 4 chronic kidney disease, or unspecified chronic kidney disease: Secondary | ICD-10-CM | POA: Diagnosis not present

## 2023-05-09 DIAGNOSIS — Z7189 Other specified counseling: Secondary | ICD-10-CM

## 2023-05-09 DIAGNOSIS — D892 Hypergammaglobulinemia, unspecified: Secondary | ICD-10-CM | POA: Diagnosis not present

## 2023-05-09 DIAGNOSIS — R768 Other specified abnormal immunological findings in serum: Secondary | ICD-10-CM | POA: Diagnosis not present

## 2023-05-09 DIAGNOSIS — R197 Diarrhea, unspecified: Secondary | ICD-10-CM | POA: Diagnosis not present

## 2023-05-09 DIAGNOSIS — E1122 Type 2 diabetes mellitus with diabetic chronic kidney disease: Secondary | ICD-10-CM | POA: Diagnosis not present

## 2023-05-09 DIAGNOSIS — Z5112 Encounter for antineoplastic immunotherapy: Secondary | ICD-10-CM | POA: Diagnosis not present

## 2023-05-09 DIAGNOSIS — E1142 Type 2 diabetes mellitus with diabetic polyneuropathy: Secondary | ICD-10-CM | POA: Diagnosis not present

## 2023-05-09 DIAGNOSIS — N183 Chronic kidney disease, stage 3 unspecified: Secondary | ICD-10-CM | POA: Diagnosis not present

## 2023-05-09 LAB — CBC WITH DIFFERENTIAL (CANCER CENTER ONLY)
Abs Immature Granulocytes: 0.01 10*3/uL (ref 0.00–0.07)
Basophils Absolute: 0.1 10*3/uL (ref 0.0–0.1)
Basophils Relative: 2 %
Eosinophils Absolute: 0.2 10*3/uL (ref 0.0–0.5)
Eosinophils Relative: 5 %
HCT: 28.5 % — ABNORMAL LOW (ref 39.0–52.0)
Hemoglobin: 9.6 g/dL — ABNORMAL LOW (ref 13.0–17.0)
Immature Granulocytes: 0 %
Lymphocytes Relative: 26 %
Lymphs Abs: 0.9 10*3/uL (ref 0.7–4.0)
MCH: 34 pg (ref 26.0–34.0)
MCHC: 33.7 g/dL (ref 30.0–36.0)
MCV: 101.1 fL — ABNORMAL HIGH (ref 80.0–100.0)
Monocytes Absolute: 0.5 10*3/uL (ref 0.1–1.0)
Monocytes Relative: 15 %
Neutro Abs: 1.9 10*3/uL (ref 1.7–7.7)
Neutrophils Relative %: 52 %
Platelet Count: 214 10*3/uL (ref 150–400)
RBC: 2.82 MIL/uL — ABNORMAL LOW (ref 4.22–5.81)
RDW: 15.5 % (ref 11.5–15.5)
WBC Count: 3.6 10*3/uL — ABNORMAL LOW (ref 4.0–10.5)
nRBC: 0 % (ref 0.0–0.2)

## 2023-05-09 LAB — CMP (CANCER CENTER ONLY)
ALT: 14 U/L (ref 0–44)
AST: 12 U/L — ABNORMAL LOW (ref 15–41)
Albumin: 3.9 g/dL (ref 3.5–5.0)
Alkaline Phosphatase: 52 U/L (ref 38–126)
Anion gap: 6 (ref 5–15)
BUN: 11 mg/dL (ref 8–23)
CO2: 24 mmol/L (ref 22–32)
Calcium: 9 mg/dL (ref 8.9–10.3)
Chloride: 107 mmol/L (ref 98–111)
Creatinine: 1.28 mg/dL — ABNORMAL HIGH (ref 0.61–1.24)
GFR, Estimated: 58 mL/min — ABNORMAL LOW (ref >60)
Glucose, Bld: 225 mg/dL — ABNORMAL HIGH (ref 70–99)
Potassium: 3.8 mmol/L (ref 3.5–5.1)
Sodium: 137 mmol/L (ref 135–145)
Total Bilirubin: 0.7 mg/dL (ref 0.3–1.2)
Total Protein: 6.3 g/dL — ABNORMAL LOW (ref 6.5–8.1)

## 2023-05-09 MED ORDER — DEXAMETHASONE 4 MG PO TABS
20.0000 mg | ORAL_TABLET | Freq: Once | ORAL | Status: AC
  Administered 2023-05-09: 20 mg via ORAL
  Filled 2023-05-09: qty 5

## 2023-05-09 MED ORDER — DARATUMUMAB-HYALURONIDASE-FIHJ 1800-30000 MG-UT/15ML ~~LOC~~ SOLN
1800.0000 mg | Freq: Once | SUBCUTANEOUS | Status: AC
  Administered 2023-05-09: 1800 mg via SUBCUTANEOUS
  Filled 2023-05-09: qty 15

## 2023-05-09 MED ORDER — DIPHENHYDRAMINE HCL 25 MG PO CAPS
50.0000 mg | ORAL_CAPSULE | Freq: Once | ORAL | Status: AC
  Administered 2023-05-09: 50 mg via ORAL
  Filled 2023-05-09: qty 2

## 2023-05-09 MED ORDER — ACETAMINOPHEN 325 MG PO TABS
650.0000 mg | ORAL_TABLET | Freq: Once | ORAL | Status: AC
  Administered 2023-05-09: 650 mg via ORAL
  Filled 2023-05-09: qty 2

## 2023-05-09 MED ORDER — FAMOTIDINE 20 MG PO TABS
20.0000 mg | ORAL_TABLET | Freq: Once | ORAL | Status: AC
  Administered 2023-05-09: 20 mg via ORAL
  Filled 2023-05-09: qty 1

## 2023-05-09 NOTE — Patient Instructions (Signed)
[place] [facility]  Discharge Instructions: Thank you for choosing [facility] to provide your oncology and hematology care.   If you have a lab appointment with the Cancer Center, please go directly to the Cancer Center and check in at the registration area.   Wear comfortable clothing and clothing appropriate for easy access to any Portacath or PICC line.   We strive to give you quality time with your provider. You may need to reschedule your appointment if you arrive late (15 or more minutes).  Arriving late affects you and other patients whose appointments are after yours.  Also, if you miss three or more appointments without notifying the office, you may be dismissed from the clinic at the provider's discretion.      For prescription refill requests, have your pharmacy contact our office and allow 72 hours for refills to be completed.    Today you received the following chemotherapy and/or immunotherapy agents: Dara Faspro.    To help prevent nausea and vomiting after your treatment, we encourage you to take your nausea medication as directed.  BELOW ARE SYMPTOMS THAT SHOULD BE REPORTED IMMEDIATELY: *FEVER GREATER THAN 100.4 F (38 C) OR HIGHER *CHILLS OR SWEATING *NAUSEA AND VOMITING THAT IS NOT CONTROLLED WITH YOUR NAUSEA MEDICATION *UNUSUAL SHORTNESS OF BREATH *UNUSUAL BRUISING OR BLEEDING *URINARY PROBLEMS (pain or burning when urinating, or frequent urination) *BOWEL PROBLEMS (unusual diarrhea, constipation, pain near the anus) TENDERNESS IN MOUTH AND THROAT WITH OR WITHOUT PRESENCE OF ULCERS (sore throat, sores in mouth, or a toothache) UNUSUAL RASH, SWELLING OR PAIN  UNUSUAL VAGINAL DISCHARGE OR ITCHING   Items with * indicate a potential emergency and should be followed up as soon as possible or go to the Emergency Department if any problems should occur.  Please show the CHEMOTHERAPY ALERT CARD or IMMUNOTHERAPY ALERT CARD at  check-in to the Emergency Department and triage nurse.  Should you have questions after your visit or need to cancel or reschedule your appointment, please contact [place] [facility]  Dept: [phone]  and follow the prompts.  Office hours are 8:00 a.m. to 4:30 p.m. Monday - Friday. Please note that voicemails left after 4:00 p.m. may not be returned until the following business day.  We are closed weekends and major holidays. You have access to a nurse at all times for urgent questions. Please call the main number to the clinic Dept: [phone] and follow the prompts.   For any non-urgent questions, you may also contact your provider using MyChart. We now offer e-Visits for anyone 75 and older to request care online for non-urgent symptoms. For details visit [email or web address].   Also download the MyChart app! Go to the app store, search "MyChart", open the app, select [place], and log in with your MyChart username and password.

## 2023-05-10 LAB — KAPPA/LAMBDA LIGHT CHAINS
Kappa free light chain: 47 mg/L — ABNORMAL HIGH (ref 3.3–19.4)
Kappa, lambda light chain ratio: 2.13 — ABNORMAL HIGH (ref 0.26–1.65)
Lambda free light chains: 22.1 mg/L (ref 5.7–26.3)

## 2023-05-11 ENCOUNTER — Encounter: Payer: Self-pay | Admitting: Internal Medicine

## 2023-05-11 ENCOUNTER — Ambulatory Visit: Payer: 59 | Attending: Internal Medicine | Admitting: Internal Medicine

## 2023-05-11 ENCOUNTER — Other Ambulatory Visit (HOSPITAL_BASED_OUTPATIENT_CLINIC_OR_DEPARTMENT_OTHER): Payer: Self-pay

## 2023-05-11 ENCOUNTER — Other Ambulatory Visit: Payer: Self-pay

## 2023-05-11 VITALS — BP 124/66 | HR 65 | Temp 97.7°F | Ht 66.0 in | Wt 167.0 lb

## 2023-05-11 DIAGNOSIS — M48061 Spinal stenosis, lumbar region without neurogenic claudication: Secondary | ICD-10-CM | POA: Diagnosis not present

## 2023-05-11 DIAGNOSIS — M12811 Other specific arthropathies, not elsewhere classified, right shoulder: Secondary | ICD-10-CM

## 2023-05-11 DIAGNOSIS — E1159 Type 2 diabetes mellitus with other circulatory complications: Secondary | ICD-10-CM | POA: Diagnosis not present

## 2023-05-11 DIAGNOSIS — E785 Hyperlipidemia, unspecified: Secondary | ICD-10-CM | POA: Diagnosis not present

## 2023-05-11 DIAGNOSIS — M75101 Unspecified rotator cuff tear or rupture of right shoulder, not specified as traumatic: Secondary | ICD-10-CM

## 2023-05-11 DIAGNOSIS — N1831 Chronic kidney disease, stage 3a: Secondary | ICD-10-CM | POA: Diagnosis not present

## 2023-05-11 DIAGNOSIS — Z23 Encounter for immunization: Secondary | ICD-10-CM | POA: Diagnosis not present

## 2023-05-11 DIAGNOSIS — E1142 Type 2 diabetes mellitus with diabetic polyneuropathy: Secondary | ICD-10-CM

## 2023-05-11 DIAGNOSIS — C9002 Multiple myeloma in relapse: Secondary | ICD-10-CM

## 2023-05-11 DIAGNOSIS — I152 Hypertension secondary to endocrine disorders: Secondary | ICD-10-CM | POA: Diagnosis not present

## 2023-05-11 DIAGNOSIS — E1169 Type 2 diabetes mellitus with other specified complication: Secondary | ICD-10-CM

## 2023-05-11 DIAGNOSIS — Z794 Long term (current) use of insulin: Secondary | ICD-10-CM | POA: Diagnosis not present

## 2023-05-11 DIAGNOSIS — F17200 Nicotine dependence, unspecified, uncomplicated: Secondary | ICD-10-CM

## 2023-05-11 LAB — POCT GLYCOSYLATED HEMOGLOBIN (HGB A1C): HbA1c, POC (controlled diabetic range): 7.5 % — AB (ref 0.0–7.0)

## 2023-05-11 LAB — GLUCOSE, POCT (MANUAL RESULT ENTRY): POC Glucose: 259 mg/dL — AB (ref 70–99)

## 2023-05-11 MED ORDER — INSULIN LISPRO PROT & LISPRO (75-25 MIX) 100 UNIT/ML KWIKPEN
PEN_INJECTOR | SUBCUTANEOUS | 11 refills | Status: DC
  Filled 2023-05-11: qty 15, fill #0

## 2023-05-11 MED ORDER — INSULIN LISPRO PROT & LISPRO (75-25 MIX) 100 UNIT/ML KWIKPEN
PEN_INJECTOR | SUBCUTANEOUS | 11 refills | Status: DC
  Filled 2023-05-11: qty 15, fill #0
  Filled 2023-05-29 (×2): qty 15, 75d supply, fill #0
  Filled 2023-08-09: qty 15, 75d supply, fill #1
  Filled 2023-11-19: qty 15, 75d supply, fill #2
  Filled 2024-02-29: qty 15, 75d supply, fill #3

## 2023-05-11 MED ORDER — COVID-19 MRNA VAC-TRIS(PFIZER) 30 MCG/0.3ML IM SUSY
0.3000 mL | PREFILLED_SYRINGE | Freq: Once | INTRAMUSCULAR | 0 refills | Status: AC
  Filled 2023-05-11: qty 0.3, 1d supply, fill #0

## 2023-05-11 MED ORDER — VALSARTAN 40 MG PO TABS
40.0000 mg | ORAL_TABLET | Freq: Every day | ORAL | 3 refills | Status: DC
  Filled 2023-05-11: qty 90, 90d supply, fill #0
  Filled 2023-05-22: qty 30, 30d supply, fill #0
  Filled 2023-06-25: qty 30, 30d supply, fill #1
  Filled 2023-08-20: qty 30, 30d supply, fill #2

## 2023-05-11 MED ORDER — ACCU-CHEK GUIDE VI STRP
ORAL_STRIP | 2 refills | Status: DC
  Filled 2023-05-11: qty 100, fill #0
  Filled 2023-05-16: qty 100, 33d supply, fill #0

## 2023-05-11 NOTE — Progress Notes (Signed)
Patient ID: Robert Sparks, male    DOB: Apr 13, 1946  MRN: [id number]  CC: Diabetes (DM f/u. Med refill. /No questions / concerns/No to flu vax.)   Subjective: Robert Sparks is a 77 y.o. male who presents for chronic ds management. His concerns today include:  Patient with history of HTN, DM 2 with polyneuropathy, HL, CKD stage 3, tob dependence, EtOH abuse, prostate CA, OA knee, vit B 12 def, spinal stenosis, and multiple myeloma in remission, memory changes MMSE 25/30 2023), CTS RT hand/EMG 12/2018.      DM: Results for orders placed or performed in visit on 05/11/23  POCT glycosylated hemoglobin (Hb A1C)  Result Value Ref Range   Hemoglobin A1C     HbA1c POC (<> result, manual entry)     HbA1c, POC (prediabetic range)     HbA1c, POC (controlled diabetic range) 7.5 (A) 0.0 - 7.0 %  POCT glucose (manual entry)  Result Value Ref Range   POC Glucose 259 (A) 70 - 99 mg/dl   *Note: Due to a large number of results and/or encounters for the requested time period, some results have not been displayed. A complete set of results can be found in Results Review.  Last A1C was 6.3. Taking Humalog 75/25 insulin 8 units BID.  Increases to 10 units (reports he increases to 12) for 3-5 days when he has to take dexamethasone associated with treatment for his multiple myeloma. Decadron 4 mg twice a mth with his MM treatments. -out of test stripes.  Checks BS 3-5 times a day.  Reports insurance did not pay for CGM.   -Reports good appetite; not to much sweets.    HTN:  taking Norvasc 10 and Diovan 40 mg  No CP or LE edema. HL:  taking and tolerating Pravachol  RT shoulder pain/LBP: Referred to pain management on last visit.  He saw PMR Dr. Alice Sparks who started him on lidocaine patch and tizanidine.  Patient states he is tolerating these medications.  He also takes Tylenol every 8 hours as needed.  He also followed up with orthopedics Dr. Christell Sparks who recommended surgery on his right shoulder and lower  back but patient has declined.  He states he prefers to deal with it with medication.  CKD 3a:  has appt 05/29/2023 with his nephrologist.  GFR lately in the 50s Tob : Still smoking.  1/pk last 3 days.  Not ready to quit completely. Patient Active Problem List   Diagnosis Date Noted   Drug-induced polyneuropathy (HCC) 09/07/2022   Pain in right shoulder 01/06/2022   Port-A-Cath in place 11/02/2021   Macroalbuminuric diabetic nephropathy (HCC) 08/31/2021   Multiple myeloma in relapse (HCC) 06/24/2021   Counseling regarding advance care planning and goals of care 06/24/2021   Memory changes 04/25/2019   Hyperlipidemia associated with type 2 diabetes mellitus (HCC) 04/25/2019   Chronic bilateral low back pain without sciatica 03/14/2019   Spinal stenosis, lumbar region with neurogenic claudication 03/14/2019   History of prostate cancer 12/20/2018   Neuropathy of right hand 12/20/2018   Primary osteoarthritis of right knee 01/07/2018   Diabetic polyneuropathy associated with type 2 diabetes mellitus (HCC) 01/07/2018   Vitamin B12 deficiency 09/04/2017   Cataract of both eyes 04/12/2017   Tobacco dependence 04/12/2017   Multiple myeloma in remission (HCC) 02/23/2017   Hyperlipidemia 06/21/2016   CKD (chronic kidney disease) stage 3, GFR 30-59 ml/min (HCC) 02/23/2016   Malignant neoplasm of prostate (HCC) 02/21/2016   Controlled type 2  diabetes mellitus with stage 3 chronic kidney disease, with long-term current use of insulin (HCC) 03/29/2015   HTN (hypertension) 07/28/2013   Anemia, chronic disease 07/26/2013   ETOH abuse 07/25/2013     Current Outpatient Medications on File Prior to Visit  Medication Sig Dispense Refill   Accu-Chek Softclix Lancets lancets Use to test blood sugar 3 times daily. 100 each 3   acetaminophen (TYLENOL) 500 MG tablet Take 2 tablets (1,000 mg total) by mouth every 6 (six) hours as needed. 30 tablet 0   acyclovir (ZOVIRAX) 400 MG tablet Take 1 tablet (400  mg total) by mouth 2 (two) times daily. 60 tablet 11   amLODipine (NORVASC) 10 MG tablet Take 1 tablet (10 mg total) by mouth daily. 90 tablet 1   aspirin EC 81 MG tablet Take 81 mg by mouth daily. Swallow whole.     b complex vitamins capsule Take 1 capsule by mouth daily. 30 capsule 5   Blood Glucose Monitoring Suppl (ACCU-CHEK GUIDE) w/Device KIT Use to test blood sugar three times daily 1 kit 0   Continuous Blood Gluc Receiver (FREESTYLE LIBRE READER) DEVI Use to check blood sugar 3x daily. E11.42 1 each 0   Continuous Blood Gluc Sensor (FREESTYLE LIBRE SENSOR SYSTEM) MISC Use to check blood sugar 3 times daily. Change sensor every 2 weeks. 2 each 12   dexamethasone (DECADRON) 4 MG tablet 20mg  (5 tabs) Take the day after each dose of daratumumab. Take with breakfast 20 tablet 11   diclofenac Sodium (VOLTAREN) 1 % GEL Apply 4 g topically 4 (four) times daily. 150 g 5   ferrous sulfate 325 (65 FE) MG tablet Take 325 mg by mouth daily.     furosemide (LASIX) 20 MG tablet Take 1 tablet (20 mg total) by mouth daily in the morning for 7 days (until 09/08/22). Save remaining tablets. 30 tablet 0   gabapentin (NEURONTIN) 300 MG capsule Take 1 capsule (300 mg total) by mouth every morning AND 1 capsule (300 mg total) daily at 12 noon AND 2 capsules (600 mg total) at bedtime. Stop Lyrica. 120 capsule 11   hydrOXYzine (ATARAX) 25 MG tablet TAKE 1 TABLET (25 MG TOTAL) BY MOUTH 3 (THREE) TIMES DAILY AS NEEDED FOR ITCHING (RASH). 30 tablet 0   Insulin Pen Needle (TECHLITE PEN NEEDLES) 32G X 4 MM MISC USE AS DIRECTED TWICE DAILY WITH INSULIN 100 each 1   ketoconazole (NIZORAL) 2 % cream Apply to skin of feet twice daily 60 g 2   lidocaine (LIDODERM) 5 % Place 2 patches onto the skin daily. 12 hours on; 12 hours off!!! Can put on neck or back for pain- Remove & Discard patch within 12 hours or as directed by MD 30 patch 0   Miconazole Nitrate (ATHLETES FOOT POWDER SPRAY) 2 % AERP Spray between toes and under toes  once daily. 130 g 4   Multiple Vitamin (MULTIVITAMIN WITH MINERALS) TABS tablet Take 1 tablet by mouth daily. 60 tablet 2   ondansetron (ZOFRAN) 8 MG tablet Take 1 tablet (8 mg total) by mouth 2 (two) times daily as needed (Nausea or vomiting). 30 tablet 1   polyethylene glycol (MIRALAX) 17 g packet Take 17 g by mouth daily. 30 each 2   pomalidomide (POMALYST) 2 MG capsule Take 1 capsule (2 mg total) by mouth daily. 3 weeks on 1 week off 21 capsule 0   potassium chloride SA (KLOR-CON M) 20 MEQ tablet Take 1 tablet (20 mEq total) by mouth  daily. 30 tablet 1   pravastatin (PRAVACHOL) 20 MG tablet Take 1 tablet (20 mg total) by mouth daily. 90 tablet 1   prochlorperazine (COMPAZINE) 10 MG tablet Take 1 tablet (10 mg total) by mouth every 6 (six) hours as needed (Nausea or vomiting). 30 tablet 1   tadalafil (CIALIS) 20 MG tablet Take 20 mg by mouth as needed.     tiZANidine (ZANAFLEX) 4 MG tablet Take 0.5-1 tablets (2-4 mg total) by mouth every 8 (eight) hours as needed for muscle spasms. 60 tablet 3   vitamin B-12 (CYANOCOBALAMIN) 100 MCG tablet Take 100 mcg by mouth daily.     donepezil (ARICEPT) 5 MG tablet TAKE 1 TABLET (5 MG TOTAL) BY MOUTH AT BEDTIME. FOR MEMORY ISSUES 30 tablet 5   No current facility-administered medications on file prior to visit.    Allergies  Allergen Reactions   Other Other (See Comments)    Nicoderm CQ = Bad Dreams Nicotine gum = Bad Dreams    Lisinopril Cough    Social History   Socioeconomic History   Marital status: Single    Spouse name: Not on file   Number of children: Not on file   Years of education: Not on file   Highest education level: Not on file  Occupational History   Not on file  Tobacco Use   Smoking status: Every Day    Current packs/day: 0.50    Average packs/day: 0.5 packs/day for 50.0 years (25.0 ttl pk-yrs)    Types: Cigarettes   Smokeless tobacco: Never   Tobacco comments:    1 ppwk-4-5 a day   Vaping Use   Vaping status:  Never Used  Substance and Sexual Activity   Alcohol use: Yes    Alcohol/week: 1.0 standard drink of alcohol    Types: 1 Cans of beer per week    Comment: 1 every day-quit couple weeks ago   Drug use: No   Sexual activity: Not Currently  Other Topics Concern   Not on file  Social History Narrative   Not on file   Social Determinants of Health   Financial Resource Strain: Low Risk  (05/11/2023)   Overall Financial Resource Strain (CARDIA)    Difficulty of Paying Living Expenses: Not hard at all  Food Insecurity: No Food Insecurity (05/11/2023)   Hunger Vital Sign    Worried About Running Out of Food in the Last Year: Never true    Ran Out of Food in the Last Year: Never true  Transportation Needs: No Transportation Needs (05/11/2023)   PRAPARE - Administrator, Civil Service (Medical): No    Lack of Transportation (Non-Medical): No  Physical Activity: Inactive (05/11/2023)   Exercise Vital Sign    Days of Exercise per Week: 0 days    Minutes of Exercise per Session: 0 min  Stress: No Stress Concern Present (05/11/2023)   [employer] of Occupational Health - Occupational Stress Questionnaire    Feeling of Stress : Not at all  Social Connections: Moderately Isolated (05/11/2023)   Social Connection and Isolation Panel [NHANES]    Frequency of Communication with Friends and Family: Three times a week    Frequency of Social Gatherings with Friends and Family: Twice a week    Attends Religious Services: Never    Database administrator or Organizations: Yes    Attends Engineer, structural: More than 4 times per year    Marital Status: Never married  Intimate Partner Violence:  Not At Risk (05/11/2023)   Humiliation, Afraid, Rape, and Kick questionnaire    Fear of Current or Ex-Partner: No    Emotionally Abused: No    Physically Abused: No    Sexually Abused: No    Family History  Problem Relation Age of Onset   Kidney disease Mother    Cancer Neg Hx     Colon cancer Neg Hx    Colon polyps Neg Hx    Esophageal cancer Neg Hx    Rectal cancer Neg Hx    Stomach cancer Neg Hx     Past Surgical History:  Procedure Laterality Date   CIRCUMCISION  1990's   PROSTATE BIOPSY N/A 12/21/2015   Procedure: BIOPSY TRANSRECTAL ULTRASONIC PROSTATE (TUBP);  Surgeon: Jerilee Field, MD;  Location: [facility];  Service: Urology;  Laterality: N/A;    ROS: Review of Systems Negative except as stated above  PHYSICAL EXAM: BP 124/66 (BP Location: Left Arm, Patient Position: Sitting, Cuff Size: Normal)   Pulse 65   Temp 97.7 F (36.5 C) (Oral)   Ht 5\' 6"  (1.676 m)   Wt 167 lb (75.8 kg)   SpO2 100%   BMI 26.95 kg/m   Wt Readings from Last 3 Encounters:  05/11/23 167 lb (75.8 kg)  05/09/23 167 lb 6.4 oz (75.9 kg)  04/25/23 168 lb 9.6 oz (76.5 kg)    Physical Exam   General appearance - alert, pleasant elderly African-American male well appearing, and in no distress Mental status - normal mood, behavior, speech, dress, motor activity, and thought processes Neck - supple, no significant adenopathy Chest - clear to auscultation, no wheezes, rales or rhonchi, symmetric air entry Heart - normal rate, regular rhythm, normal S1, S2, no murmurs, rubs, clicks or gallops Extremities -trace lower extremity edema.     Latest Ref Rng & Units 05/09/2023    8:54 AM 04/11/2023    8:53 AM 03/28/2023   11:51 AM  CMP  Glucose 70 - 99 mg/dL 161  096  045   BUN 8 - 23 mg/dL 11  14  20    Creatinine 0.61 - 1.24 mg/dL 4.09  8.11  9.14   Sodium 135 - 145 mmol/L 137  138  137   Potassium 3.5 - 5.1 mmol/L 3.8  3.6  4.3   Chloride 98 - 111 mmol/L 107  105  105   CO2 22 - 32 mmol/L 24  25  27    Calcium 8.9 - 10.3 mg/dL 9.0  9.6  9.1   Total Protein 6.5 - 8.1 g/dL 6.3  6.8  6.4   Total Bilirubin 0.3 - 1.2 mg/dL 0.7  0.8  0.8   Alkaline Phos 38 - 126 U/L 52  64  62   AST 15 - 41 U/L 12  16  12    ALT 0 - 44 U/L 14  16  14     Lipid Panel      Component Value Date/Time   CHOL 199 09/07/2022 1008   TRIG 116 09/07/2022 1008   HDL 96 09/07/2022 1008   CHOLHDL 2.1 09/07/2022 1008   CHOLHDL 3.6 06/21/2016 1047   VLDL 50 (H) 06/21/2016 1047   LDLCALC 83 09/07/2022 1008    CBC    Component Value Date/Time   WBC 3.6 (L) 05/09/2023 0854   WBC 5.6 07/13/2021 0741   RBC 2.82 (L) 05/09/2023 0854   HGB 9.6 (L) 05/09/2023 0854   HGB 12.2 (L) 12/06/2017 0920   HGB 10.1 (L)  07/13/2017 0938   HCT 28.5 (L) 05/09/2023 0854   HCT 37.1 (L) 12/06/2017 0920   HCT 30.6 (L) 07/13/2017 0938   PLT 214 05/09/2023 0854   PLT 250 12/06/2017 0920   MCV 101.1 (H) 05/09/2023 0854   MCV 98 (H) 12/06/2017 0920   MCV 96.2 07/13/2017 0938   MCH 34.0 05/09/2023 0854   MCHC 33.7 05/09/2023 0854   RDW 15.5 05/09/2023 0854   RDW 14.1 12/06/2017 0920   RDW 14.4 07/13/2017 0938   LYMPHSABS 0.9 05/09/2023 0854   LYMPHSABS 0.8 (L) 07/13/2017 0938   MONOABS 0.5 05/09/2023 0854   MONOABS 0.4 07/13/2017 0938   EOSABS 0.2 05/09/2023 0854   EOSABS 0.5 07/13/2017 0938   EOSABS 0.8 (H) 11/21/2016 1416   BASOSABS 0.1 05/09/2023 0854   BASOSABS 0.0 07/13/2017 0938    ASSESSMENT AND PLAN: 1. Type 2 diabetes mellitus with peripheral neuropathy (HCC) A1c has increased above goal since last visit.  He receives Decadron about twice a month and reports that blood sugars are elevated for several days after taking it.  We will have him increase his Humalog mix to 9 units twice a day and 12 units for 3 to 5 days twice a month after dose of Decadron.  Patient expressed understanding. - POCT glycosylated hemoglobin (Hb A1C) - POCT glucose (manual entry) - glucose blood (ACCU-CHEK GUIDE) test strip; Use to test blood sugar 3 times daily.  Dispense: 100 each; Refill: 2 - Ambulatory referral to Ophthalmology - Insulin Lispro Prot & Lispro (HUMALOG MIX 75/25 KWIKPEN) (75-25) 100 UNIT/ML Kwikpen; Inject 9 units subcutaneously with breakfast and dinner. Increase to 12 units  for 3-5 days twice a month after dexamethasone/decadron.  Dispense: 15 mL; Refill: 11  2. Insulin long-term use (HCC) See #1 above  3. Hypertension associated with type 2 diabetes mellitus (HCC) At goal.  Continue amlodipine 10 mg daily and Diovan 40 mg daily. - valsartan (DIOVAN) 40 MG tablet; Take 1 tablet (40 mg total) by mouth daily. STOP LISINOPRIL  Dispense: 90 tablet; Refill: 3  4. Hyperlipidemia associated with type 2 diabetes mellitus (HCC) Continue pravastatin 20 mg daily  5. Rotator cuff tear arthropathy of right shoulder Patient coping with pain using Tylenol and tizanidine.  He is not interested in surgical option.  6. Tobacco dependence Strongly advised to quit.  7. Multiple myeloma in relapse [facility]) Still being treated for relapsed through his oncologist.  8. Spinal stenosis of lumbar region without neurogenic claudication Saw PMR.  Lidocaine patches prescribed which he states he is using.  He declines surgical option.  9. Stage 3a chronic kidney disease (HCC) We will continue to monitor; not on NSAIDs.  10. Encounter for immunization - Flu Vaccine Trivalent High Dose (Fluad)  Plans to get COVID-19 booster from our pharmacy downstairs.  Patient was given the opportunity to ask questions.  Patient verbalized understanding of the plan and was able to repeat key elements of the plan.   This documentation was completed using Paediatric nurse.  Any transcriptional errors are unintentional.  Orders Placed This Encounter  Procedures   Flu Vaccine Trivalent High Dose (Fluad)   Ambulatory referral to Ophthalmology   POCT glycosylated hemoglobin (Hb A1C)   POCT glucose (manual entry)     Requested Prescriptions   Signed Prescriptions Disp Refills   valsartan (DIOVAN) 40 MG tablet 90 tablet 3    Sig: Take 1 tablet (40 mg total) by mouth daily. STOP LISINOPRIL   glucose blood (ACCU-CHEK GUIDE)  test strip 100 each 2    Sig: Use to test blood  sugar 3 times daily.   Insulin Lispro Prot & Lispro (HUMALOG MIX 75/25 KWIKPEN) (75-25) 100 UNIT/ML Kwikpen 15 mL 11    Sig: Inject 9 units subcutaneously with breakfast and dinner. Increase to 12 units for 3-5 days twice a month after dexamethasone/decadron.    Return in about 4 months (around 09/08/2023).  Jonah Blue, MD, FACP

## 2023-05-11 NOTE — Patient Instructions (Signed)
Increase your insulin to 9 units twice a day with meals.  Take 12 units for 3-5 days after receiving Decadron.

## 2023-05-13 ENCOUNTER — Other Ambulatory Visit: Payer: Self-pay

## 2023-05-14 ENCOUNTER — Other Ambulatory Visit: Payer: Self-pay

## 2023-05-15 LAB — MULTIPLE MYELOMA PANEL, SERUM
Albumin SerPl Elph-Mcnc: 3.3 g/dL (ref 2.9–4.4)
Albumin/Glob SerPl: 1.4 (ref 0.7–1.7)
Alpha 1: 0.3 g/dL (ref 0.0–0.4)
Alpha2 Glob SerPl Elph-Mcnc: 0.8 g/dL (ref 0.4–1.0)
B-Globulin SerPl Elph-Mcnc: 0.8 g/dL (ref 0.7–1.3)
Gamma Glob SerPl Elph-Mcnc: 0.5 g/dL (ref 0.4–1.8)
Globulin, Total: 2.4 g/dL (ref 2.2–3.9)
IgA: 94 mg/dL (ref 61–437)
IgG (Immunoglobin G), Serum: 544 mg/dL — ABNORMAL LOW (ref 603–1613)
IgM (Immunoglobulin M), Srm: 25 mg/dL (ref 15–143)
M Protein SerPl Elph-Mcnc: 0.2 g/dL — ABNORMAL HIGH
Total Protein ELP: 5.7 g/dL — ABNORMAL LOW (ref 6.0–8.5)

## 2023-05-16 ENCOUNTER — Other Ambulatory Visit: Payer: Self-pay

## 2023-05-17 ENCOUNTER — Other Ambulatory Visit: Payer: Self-pay

## 2023-05-17 DIAGNOSIS — C9002 Multiple myeloma in relapse: Secondary | ICD-10-CM

## 2023-05-17 MED ORDER — POMALIDOMIDE 2 MG PO CAPS: 2.0000 mg | ORAL_CAPSULE | Freq: Every day | ORAL | 0 refills | Status: DC

## 2023-05-22 ENCOUNTER — Other Ambulatory Visit: Payer: Self-pay

## 2023-05-22 ENCOUNTER — Other Ambulatory Visit: Payer: Self-pay | Admitting: Physical Medicine and Rehabilitation

## 2023-05-22 MED ORDER — LIDOCAINE 5 % EX PTCH
2.0000 | MEDICATED_PATCH | CUTANEOUS | 5 refills | Status: DC
  Filled 2023-05-22: qty 60, 30d supply, fill #0

## 2023-05-23 ENCOUNTER — Inpatient Hospital Stay: Payer: 59 | Attending: Hematology

## 2023-05-23 ENCOUNTER — Other Ambulatory Visit: Payer: Self-pay

## 2023-05-23 ENCOUNTER — Inpatient Hospital Stay: Payer: 59

## 2023-05-23 VITALS — BP 127/65 | HR 67 | Temp 97.7°F | Resp 18 | Wt 167.4 lb

## 2023-05-23 DIAGNOSIS — Z5112 Encounter for antineoplastic immunotherapy: Secondary | ICD-10-CM | POA: Insufficient documentation

## 2023-05-23 DIAGNOSIS — Z7189 Other specified counseling: Secondary | ICD-10-CM

## 2023-05-23 DIAGNOSIS — C9002 Multiple myeloma in relapse: Secondary | ICD-10-CM | POA: Diagnosis not present

## 2023-05-23 LAB — CBC WITH DIFFERENTIAL (CANCER CENTER ONLY)
Abs Immature Granulocytes: 0.02 10*3/uL (ref 0.00–0.07)
Basophils Absolute: 0.1 10*3/uL (ref 0.0–0.1)
Basophils Relative: 2 %
Eosinophils Absolute: 0.2 10*3/uL (ref 0.0–0.5)
Eosinophils Relative: 5 %
HCT: 27.7 % — ABNORMAL LOW (ref 39.0–52.0)
Hemoglobin: 9.5 g/dL — ABNORMAL LOW (ref 13.0–17.0)
Immature Granulocytes: 1 %
Lymphocytes Relative: 29 %
Lymphs Abs: 1.2 10*3/uL (ref 0.7–4.0)
MCH: 33.8 pg (ref 26.0–34.0)
MCHC: 34.3 g/dL (ref 30.0–36.0)
MCV: 98.6 fL (ref 80.0–100.0)
Monocytes Absolute: 0.5 10*3/uL (ref 0.1–1.0)
Monocytes Relative: 12 %
Neutro Abs: 2.1 10*3/uL (ref 1.7–7.7)
Neutrophils Relative %: 51 %
Platelet Count: 184 10*3/uL (ref 150–400)
RBC: 2.81 MIL/uL — ABNORMAL LOW (ref 4.22–5.81)
RDW: 15.1 % (ref 11.5–15.5)
WBC Count: 4 10*3/uL (ref 4.0–10.5)
nRBC: 0 % (ref 0.0–0.2)

## 2023-05-23 LAB — CMP (CANCER CENTER ONLY)
ALT: 14 U/L (ref 0–44)
AST: 15 U/L (ref 15–41)
Albumin: 3.7 g/dL (ref 3.5–5.0)
Alkaline Phosphatase: 61 U/L (ref 38–126)
Anion gap: 5 (ref 5–15)
BUN: 9 mg/dL (ref 8–23)
CO2: 27 mmol/L (ref 22–32)
Calcium: 9.4 mg/dL (ref 8.9–10.3)
Chloride: 106 mmol/L (ref 98–111)
Creatinine: 1.34 mg/dL — ABNORMAL HIGH (ref 0.61–1.24)
GFR, Estimated: 55 mL/min — ABNORMAL LOW (ref >60)
Glucose, Bld: 232 mg/dL — ABNORMAL HIGH (ref 70–99)
Potassium: 3.9 mmol/L (ref 3.5–5.1)
Sodium: 138 mmol/L (ref 135–145)
Total Bilirubin: 0.6 mg/dL (ref <1.2)
Total Protein: 6.2 g/dL — ABNORMAL LOW (ref 6.5–8.1)

## 2023-05-23 MED ORDER — ACETAMINOPHEN 325 MG PO TABS
650.0000 mg | ORAL_TABLET | Freq: Once | ORAL | Status: AC
  Administered 2023-05-23: 650 mg via ORAL
  Filled 2023-05-23: qty 2

## 2023-05-23 MED ORDER — DIPHENHYDRAMINE HCL 25 MG PO CAPS
50.0000 mg | ORAL_CAPSULE | Freq: Once | ORAL | Status: AC
  Administered 2023-05-23: 50 mg via ORAL
  Filled 2023-05-23: qty 2

## 2023-05-23 MED ORDER — DARATUMUMAB-HYALURONIDASE-FIHJ 1800-30000 MG-UT/15ML ~~LOC~~ SOLN
1800.0000 mg | Freq: Once | SUBCUTANEOUS | Status: AC
  Administered 2023-05-23: 1800 mg via SUBCUTANEOUS
  Filled 2023-05-23: qty 15

## 2023-05-23 MED ORDER — FAMOTIDINE 20 MG PO TABS
20.0000 mg | ORAL_TABLET | Freq: Once | ORAL | Status: AC
  Administered 2023-05-23: 20 mg via ORAL
  Filled 2023-05-23: qty 1

## 2023-05-23 MED ORDER — DEXAMETHASONE 4 MG PO TABS
20.0000 mg | ORAL_TABLET | Freq: Once | ORAL | Status: AC
  Administered 2023-05-23: 20 mg via ORAL
  Filled 2023-05-23: qty 5

## 2023-05-23 NOTE — Patient Instructions (Signed)

## 2023-05-28 ENCOUNTER — Other Ambulatory Visit: Payer: Self-pay | Admitting: Internal Medicine

## 2023-05-28 ENCOUNTER — Other Ambulatory Visit: Payer: Self-pay

## 2023-05-29 ENCOUNTER — Other Ambulatory Visit: Payer: Self-pay

## 2023-05-29 DIAGNOSIS — N1831 Chronic kidney disease, stage 3a: Secondary | ICD-10-CM | POA: Diagnosis not present

## 2023-05-29 DIAGNOSIS — N2581 Secondary hyperparathyroidism of renal origin: Secondary | ICD-10-CM | POA: Diagnosis not present

## 2023-05-29 DIAGNOSIS — E1122 Type 2 diabetes mellitus with diabetic chronic kidney disease: Secondary | ICD-10-CM | POA: Diagnosis not present

## 2023-05-29 DIAGNOSIS — C9000 Multiple myeloma not having achieved remission: Secondary | ICD-10-CM | POA: Diagnosis not present

## 2023-05-29 DIAGNOSIS — I129 Hypertensive chronic kidney disease with stage 1 through stage 4 chronic kidney disease, or unspecified chronic kidney disease: Secondary | ICD-10-CM | POA: Diagnosis not present

## 2023-05-29 MED ORDER — INSUPEN PEN NEEDLES 32G X 4 MM MISC
1.0000 | Freq: Two times a day (BID) | 1 refills | Status: DC
  Filled 2023-05-29 – 2023-07-23 (×2): qty 100, 50d supply, fill #0
  Filled 2023-10-05: qty 100, 50d supply, fill #1

## 2023-05-29 NOTE — Telephone Encounter (Signed)
Requested Prescriptions  Pending Prescriptions Disp Refills   Insulin Pen Needle (INSUPEN PEN NEEDLES) 32G X 4 MM MISC 100 each 1    Sig: USE AS DIRECTED TWICE DAILY WITH INSULIN     Endocrinology: Diabetes - Testing Supplies Passed - 05/28/2023 11:55 AM      Passed - Valid encounter within last 12 months    Recent Outpatient Visits           2 weeks ago Type 2 diabetes mellitus with peripheral neuropathy (HCC)   [place] [facility] [place]. [facility] Jonah Blue B, MD   4 months ago Type 2 diabetes mellitus with peripheral neuropathy [facility])   [place] [facility] Of [place]. [facility] Jonah Blue B, MD   8 months ago Type 2 diabetes mellitus with peripheral neuropathy [facility])   [place] [facility] - A Dept Of [place]. [facility] Jonah Blue B, MD   11 months ago Type 2 diabetes mellitus with peripheral neuropathy [facility])   [place] [facility] - A Dept Of [place]. [facility] Yehuda Savannah L, RPH-CPP   1 year ago Type 2 diabetes mellitus with peripheral neuropathy [facility])   [place] [facility] - A Dept Of [place]. [facility] Marcine Matar, MD       Future Appointments             In 3 months Laural Benes Binnie Rail, MD [facility] - A Dept Of Eligha Bridegroom. [facility]

## 2023-05-30 ENCOUNTER — Other Ambulatory Visit: Payer: Self-pay

## 2023-06-04 ENCOUNTER — Other Ambulatory Visit: Payer: Self-pay

## 2023-06-06 ENCOUNTER — Other Ambulatory Visit: Payer: Self-pay

## 2023-06-06 ENCOUNTER — Inpatient Hospital Stay: Payer: 59

## 2023-06-06 ENCOUNTER — Encounter: Payer: 59 | Admitting: Physical Medicine and Rehabilitation

## 2023-06-06 VITALS — BP 128/68 | HR 68 | Temp 98.2°F | Resp 18 | Wt 167.4 lb

## 2023-06-06 DIAGNOSIS — C9002 Multiple myeloma in relapse: Secondary | ICD-10-CM | POA: Diagnosis not present

## 2023-06-06 DIAGNOSIS — Z5112 Encounter for antineoplastic immunotherapy: Secondary | ICD-10-CM | POA: Diagnosis not present

## 2023-06-06 DIAGNOSIS — Z7189 Other specified counseling: Secondary | ICD-10-CM

## 2023-06-06 LAB — CBC WITH DIFFERENTIAL (CANCER CENTER ONLY)
Abs Immature Granulocytes: 0.03 10*3/uL (ref 0.00–0.07)
Basophils Absolute: 0.1 10*3/uL (ref 0.0–0.1)
Basophils Relative: 1 %
Eosinophils Absolute: 0.1 10*3/uL (ref 0.0–0.5)
Eosinophils Relative: 3 %
HCT: 29.8 % — ABNORMAL LOW (ref 39.0–52.0)
Hemoglobin: 10 g/dL — ABNORMAL LOW (ref 13.0–17.0)
Immature Granulocytes: 1 %
Lymphocytes Relative: 25 %
Lymphs Abs: 1.1 10*3/uL (ref 0.7–4.0)
MCH: 33.4 pg (ref 26.0–34.0)
MCHC: 33.6 g/dL (ref 30.0–36.0)
MCV: 99.7 fL (ref 80.0–100.0)
Monocytes Absolute: 0.4 10*3/uL (ref 0.1–1.0)
Monocytes Relative: 10 %
Neutro Abs: 2.5 10*3/uL (ref 1.7–7.7)
Neutrophils Relative %: 60 %
Platelet Count: 269 10*3/uL (ref 150–400)
RBC: 2.99 MIL/uL — ABNORMAL LOW (ref 4.22–5.81)
RDW: 15.4 % (ref 11.5–15.5)
WBC Count: 4.3 10*3/uL (ref 4.0–10.5)
nRBC: 0 % (ref 0.0–0.2)

## 2023-06-06 LAB — CMP (CANCER CENTER ONLY)
ALT: 16 U/L (ref 0–44)
AST: 15 U/L (ref 15–41)
Albumin: 4.2 g/dL (ref 3.5–5.0)
Alkaline Phosphatase: 74 U/L (ref 38–126)
Anion gap: 7 (ref 5–15)
BUN: 18 mg/dL (ref 8–23)
CO2: 25 mmol/L (ref 22–32)
Calcium: 10.3 mg/dL (ref 8.9–10.3)
Chloride: 106 mmol/L (ref 98–111)
Creatinine: 1.52 mg/dL — ABNORMAL HIGH (ref 0.61–1.24)
GFR, Estimated: 47 mL/min — ABNORMAL LOW (ref >60)
Glucose, Bld: 134 mg/dL — ABNORMAL HIGH (ref 70–99)
Potassium: 3.8 mmol/L (ref 3.5–5.1)
Sodium: 138 mmol/L (ref 135–145)
Total Bilirubin: 0.6 mg/dL (ref <1.2)
Total Protein: 7 g/dL (ref 6.5–8.1)

## 2023-06-06 MED ORDER — DARATUMUMAB-HYALURONIDASE-FIHJ 1800-30000 MG-UT/15ML ~~LOC~~ SOLN
1800.0000 mg | Freq: Once | SUBCUTANEOUS | Status: AC
Start: 2023-06-06 — End: 2023-06-06
  Administered 2023-06-06: 1800 mg via SUBCUTANEOUS
  Filled 2023-06-06: qty 15

## 2023-06-06 MED ORDER — DEXAMETHASONE 4 MG PO TABS
20.0000 mg | ORAL_TABLET | Freq: Once | ORAL | Status: AC
  Administered 2023-06-06: 20 mg via ORAL
  Filled 2023-06-06: qty 5

## 2023-06-06 MED ORDER — ACETAMINOPHEN 325 MG PO TABS
650.0000 mg | ORAL_TABLET | Freq: Once | ORAL | Status: AC
  Administered 2023-06-06: 650 mg via ORAL
  Filled 2023-06-06: qty 2

## 2023-06-06 MED ORDER — FAMOTIDINE 20 MG PO TABS
20.0000 mg | ORAL_TABLET | Freq: Once | ORAL | Status: AC
  Administered 2023-06-06: 20 mg via ORAL
  Filled 2023-06-06: qty 1

## 2023-06-06 MED ORDER — DIPHENHYDRAMINE HCL 25 MG PO CAPS
50.0000 mg | ORAL_CAPSULE | Freq: Once | ORAL | Status: AC
  Administered 2023-06-06: 50 mg via ORAL
  Filled 2023-06-06: qty 2

## 2023-06-06 NOTE — Patient Instructions (Signed)

## 2023-06-07 LAB — KAPPA/LAMBDA LIGHT CHAINS
Kappa free light chain: 51.4 mg/L — ABNORMAL HIGH (ref 3.3–19.4)
Kappa, lambda light chain ratio: 1.93 — ABNORMAL HIGH (ref 0.26–1.65)
Lambda free light chains: 26.7 mg/L — ABNORMAL HIGH (ref 5.7–26.3)

## 2023-06-08 ENCOUNTER — Other Ambulatory Visit: Payer: Self-pay | Admitting: Internal Medicine

## 2023-06-08 ENCOUNTER — Other Ambulatory Visit: Payer: Self-pay

## 2023-06-08 DIAGNOSIS — E1142 Type 2 diabetes mellitus with diabetic polyneuropathy: Secondary | ICD-10-CM

## 2023-06-08 MED ORDER — GLUCOSE BLOOD VI STRP
ORAL_STRIP | 2 refills | Status: DC
  Filled 2023-06-08: qty 100, 33d supply, fill #0
  Filled 2023-07-05: qty 100, 33d supply, fill #1
  Filled 2023-07-25 – 2023-07-30 (×4): qty 100, 33d supply, fill #2

## 2023-06-12 ENCOUNTER — Other Ambulatory Visit: Payer: Self-pay

## 2023-06-13 ENCOUNTER — Other Ambulatory Visit: Payer: Self-pay

## 2023-06-13 DIAGNOSIS — C9002 Multiple myeloma in relapse: Secondary | ICD-10-CM

## 2023-06-13 MED ORDER — POMALIDOMIDE 2 MG PO CAPS: 2.0000 mg | ORAL_CAPSULE | Freq: Every day | ORAL | 0 refills | Status: DC

## 2023-06-14 LAB — MULTIPLE MYELOMA PANEL, SERUM
Albumin SerPl Elph-Mcnc: 3.9 g/dL (ref 2.9–4.4)
Albumin/Glob SerPl: 1.7 (ref 0.7–1.7)
Alpha 1: 0.2 g/dL (ref 0.0–0.4)
Alpha2 Glob SerPl Elph-Mcnc: 0.8 g/dL (ref 0.4–1.0)
B-Globulin SerPl Elph-Mcnc: 0.9 g/dL (ref 0.7–1.3)
Gamma Glob SerPl Elph-Mcnc: 0.5 g/dL (ref 0.4–1.8)
Globulin, Total: 2.4 g/dL (ref 2.2–3.9)
IgA: 100 mg/dL (ref 61–437)
IgG (Immunoglobin G), Serum: 636 mg/dL (ref 603–1613)
IgM (Immunoglobulin M), Srm: 31 mg/dL (ref 15–143)
M Protein SerPl Elph-Mcnc: 0.2 g/dL — ABNORMAL HIGH
Total Protein ELP: 6.3 g/dL (ref 6.0–8.5)

## 2023-06-20 ENCOUNTER — Inpatient Hospital Stay (HOSPITAL_BASED_OUTPATIENT_CLINIC_OR_DEPARTMENT_OTHER): Payer: 59 | Admitting: Hematology

## 2023-06-20 ENCOUNTER — Other Ambulatory Visit: Payer: Self-pay

## 2023-06-20 ENCOUNTER — Inpatient Hospital Stay: Payer: 59 | Attending: Hematology

## 2023-06-20 ENCOUNTER — Inpatient Hospital Stay: Payer: 59

## 2023-06-20 VITALS — BP 99/52 | HR 66 | Temp 97.7°F | Resp 18 | Wt 162.9 lb

## 2023-06-20 DIAGNOSIS — C9002 Multiple myeloma in relapse: Secondary | ICD-10-CM | POA: Diagnosis not present

## 2023-06-20 DIAGNOSIS — Z5111 Encounter for antineoplastic chemotherapy: Secondary | ICD-10-CM

## 2023-06-20 DIAGNOSIS — Z7189 Other specified counseling: Secondary | ICD-10-CM

## 2023-06-20 DIAGNOSIS — Z5112 Encounter for antineoplastic immunotherapy: Secondary | ICD-10-CM | POA: Diagnosis not present

## 2023-06-20 LAB — CBC WITH DIFFERENTIAL (CANCER CENTER ONLY)
Abs Immature Granulocytes: 0.01 10*3/uL (ref 0.00–0.07)
Basophils Absolute: 0.1 10*3/uL (ref 0.0–0.1)
Basophils Relative: 2 %
Eosinophils Absolute: 0.3 10*3/uL (ref 0.0–0.5)
Eosinophils Relative: 8 %
HCT: 29.3 % — ABNORMAL LOW (ref 39.0–52.0)
Hemoglobin: 9.8 g/dL — ABNORMAL LOW (ref 13.0–17.0)
Immature Granulocytes: 0 %
Lymphocytes Relative: 25 %
Lymphs Abs: 1 10*3/uL (ref 0.7–4.0)
MCH: 33.1 pg (ref 26.0–34.0)
MCHC: 33.4 g/dL (ref 30.0–36.0)
MCV: 99 fL (ref 80.0–100.0)
Monocytes Absolute: 0.6 10*3/uL (ref 0.1–1.0)
Monocytes Relative: 14 %
Neutro Abs: 1.9 10*3/uL (ref 1.7–7.7)
Neutrophils Relative %: 51 %
Platelet Count: 182 10*3/uL (ref 150–400)
RBC: 2.96 MIL/uL — ABNORMAL LOW (ref 4.22–5.81)
RDW: 15.3 % (ref 11.5–15.5)
WBC Count: 3.8 10*3/uL — ABNORMAL LOW (ref 4.0–10.5)
nRBC: 0 % (ref 0.0–0.2)

## 2023-06-20 LAB — CMP (CANCER CENTER ONLY)
ALT: 17 U/L (ref 0–44)
AST: 17 U/L (ref 15–41)
Albumin: 3.8 g/dL (ref 3.5–5.0)
Alkaline Phosphatase: 77 U/L (ref 38–126)
Anion gap: 11 (ref 5–15)
BUN: 29 mg/dL — ABNORMAL HIGH (ref 8–23)
CO2: 22 mmol/L (ref 22–32)
Calcium: 9.1 mg/dL (ref 8.9–10.3)
Chloride: 104 mmol/L (ref 98–111)
Creatinine: 1.78 mg/dL — ABNORMAL HIGH (ref 0.61–1.24)
GFR, Estimated: 39 mL/min — ABNORMAL LOW (ref >60)
Glucose, Bld: 183 mg/dL — ABNORMAL HIGH (ref 70–99)
Potassium: 4.3 mmol/L (ref 3.5–5.1)
Sodium: 137 mmol/L (ref 135–145)
Total Bilirubin: 0.8 mg/dL (ref <1.2)
Total Protein: 6.1 g/dL — ABNORMAL LOW (ref 6.5–8.1)

## 2023-06-20 MED ORDER — DEXAMETHASONE 4 MG PO TABS
20.0000 mg | ORAL_TABLET | Freq: Once | ORAL | Status: AC
  Administered 2023-06-20: 20 mg via ORAL
  Filled 2023-06-20: qty 5

## 2023-06-20 MED ORDER — DIPHENHYDRAMINE HCL 25 MG PO CAPS
50.0000 mg | ORAL_CAPSULE | Freq: Once | ORAL | Status: AC
  Administered 2023-06-20: 50 mg via ORAL
  Filled 2023-06-20: qty 2

## 2023-06-20 MED ORDER — ACETAMINOPHEN 325 MG PO TABS
650.0000 mg | ORAL_TABLET | Freq: Once | ORAL | Status: AC
  Administered 2023-06-20: 650 mg via ORAL
  Filled 2023-06-20: qty 2

## 2023-06-20 MED ORDER — FAMOTIDINE 20 MG PO TABS
20.0000 mg | ORAL_TABLET | Freq: Once | ORAL | Status: AC
  Administered 2023-06-20: 20 mg via ORAL
  Filled 2023-06-20: qty 1

## 2023-06-20 MED ORDER — DARATUMUMAB-HYALURONIDASE-FIHJ 1800-30000 MG-UT/15ML ~~LOC~~ SOLN
1800.0000 mg | Freq: Once | SUBCUTANEOUS | Status: AC
  Administered 2023-06-20: 1800 mg via SUBCUTANEOUS
  Filled 2023-06-20: qty 15

## 2023-06-20 NOTE — Progress Notes (Signed)
HEMATOLOGY/ONCOLOGY CLINIC NOTE  Date of Service: 06/20/23   Patient Care Team: Marcine Matar, MD as PCP - General (Internal Medicine)  CHIEF COMPLAINTS/PURPOSE OF CONSULTATION:  Follow-up for continued evaluation and management of relapsed multiple myeloma  Oncologic History:  77 y.o. male with diagnosis of IgA lambda active multiple myeloma, ISS Stage I. Active disease diagnosed based on presence of anemia, kidney insufficiency, and paraproteinemia with significant predominance of lambda light chains as well as significant elevation of IgA. Bone marrow biopsy confirmed presence of atypical monoclonal plasma cell process in the bone marrow comprising at least 7% of the cellularity. Based on the findings, patient was started on treatment with lenalidomide, bortezomib, and low-dose dexamethasone based on the anticipated tolerance by the patient. Lenalidomide was started at 10 mg daily based on creatinine clearance of 42, dexamethasone dose was reduced to 20 mg weekly based on patient's age. Treatment was complicated by rapid development of cutaneous rash attributed to lenalidomide, inaddition to decreased renal function and now patient has persistent severe anemia. Side-effects were attributed to Lenalidomide and lenalidomide was discontinued with subsequent recovery.  Patient has been receiving bortezomib weekly with low-dose dexamethasone in 4-day cycles.   Patient has completed induction systemic therapy for his disease with repeat bone marrow biopsy confirming complete response including no evidence of minimal residual disease by cytogenetics or FISH.  HISTORY OF PRESENTING ILLNESS:  Please see previous note for details of initial presentation.  [name] is a 77 y.o. male is here for continued evaluation and management of his relapsed multiple myeloma. Patient was last seen by me on 04/25/2023 and reported loss of balance sometimes, mild  tingling/numbness, arthritis in right knee with intermittent discomfort, and diarrhea 1-2 times a day.   Today, he presents in a wheelchair. Patient complains of dizziness sometimes when standing, which he attributes to his blood pressure medication. Patient reports that he does take amlodipine and valsartan.   He does have a blood pressure machine at home, and reports that his systolic reading in the mornings is generally 132. His blood pressure in clinic today is 99/52. He reports that he generally drinks 3-4 16-ounce  bottles a day.   Patient has been tolerating Daratumumab and Pomalidomide well with no toxicities. Patient denies any infection issues, fever, chills, SOB, chest pain, new leg swelling, or new abdominal pain. He reports stable arthritis pains, but denies any new bone pains.   He reports constant night sweats. Patient also reports soreness in his right knee.   MEDICAL HISTORY:  Past Medical History:  Diagnosis Date   Anemia    Arthritis    shoulders,feet    Cataract    per eye dr -has appt 5-11   CKD (chronic kidney disease), stage III (HCC)    Elevated PSA    History of ketoacidosis    03-29-2015    History of sepsis    07-25-2013  non-compliant w/ medication   Hyperlipidemia    Hypertension    Nocturia    Peripheral neuropathy    Prostate cancer (HCC)    Type 2 diabetes mellitus with insulin therapy [facility])     SURGICAL HISTORY: Past Surgical History:  Procedure Laterality Date   CIRCUMCISION  1990's   PROSTATE BIOPSY N/A 12/21/2015   Procedure: BIOPSY TRANSRECTAL ULTRASONIC PROSTATE (TUBP);  Surgeon: Jerilee Field, MD;  Location: [facility];  Service: Urology;  Laterality: N/A;    SOCIAL HISTORY: Social History  Socioeconomic History   Marital status: Single    Spouse name: Not on file   Number of children: Not on file   Years of education: Not on file   Highest education level: Not on file  Occupational History   Not on file   Tobacco Use   Smoking status: Every Day    Current packs/day: 0.50    Average packs/day: 0.5 packs/day for 50.0 years (25.0 ttl pk-yrs)    Types: Cigarettes   Smokeless tobacco: Never   Tobacco comments:    1 ppwk-4-5 a day   Vaping Use   Vaping status: Never Used  Substance and Sexual Activity   Alcohol use: Yes    Alcohol/week: 1.0 standard drink of alcohol    Types: 1 Cans of beer per week    Comment: 1 every day-quit couple weeks ago   Drug use: No   Sexual activity: Not Currently  Other Topics Concern   Not on file  Social History Narrative   Not on file   Social Determinants of Health   Financial Resource Strain: Low Risk  (05/11/2023)   Overall Financial Resource Strain (CARDIA)    Difficulty of Paying Living Expenses: Not hard at all  Food Insecurity: No Food Insecurity (05/11/2023)   Hunger Vital Sign    Worried About Running Out of Food in the Last Year: Never true    Ran Out of Food in the Last Year: Never true  Transportation Needs: No Transportation Needs (05/11/2023)   PRAPARE - Administrator, Civil Service (Medical): No    Lack of Transportation (Non-Medical): No  Physical Activity: Inactive (05/11/2023)   Exercise Vital Sign    Days of Exercise per Week: 0 days    Minutes of Exercise per Session: 0 min  Stress: No Stress Concern Present (05/11/2023)   [employer] of Occupational Health - Occupational Stress Questionnaire    Feeling of Stress : Not at all  Social Connections: Moderately Isolated (05/11/2023)   Social Connection and Isolation Panel [NHANES]    Frequency of Communication with Friends and Family: Three times a week    Frequency of Social Gatherings with Friends and Family: Twice a week    Attends Religious Services: Never    Database administrator or Organizations: Yes    Attends Engineer, structural: More than 4 times per year    Marital Status: Never married  Intimate Partner Violence: Not At Risk (05/11/2023)    Humiliation, Afraid, Rape, and Kick questionnaire    Fear of Current or Ex-Partner: No    Emotionally Abused: No    Physically Abused: No    Sexually Abused: No    FAMILY HISTORY: Family History  Problem Relation Age of Onset   Kidney disease Mother    Cancer Neg Hx    Colon cancer Neg Hx    Colon polyps Neg Hx    Esophageal cancer Neg Hx    Rectal cancer Neg Hx    Stomach cancer Neg Hx     ALLERGIES:  is allergic to other and lisinopril.  MEDICATIONS:  Current Outpatient Medications  Medication Sig Dispense Refill   Accu-Chek Softclix Lancets lancets Use to test blood sugar 3 times daily. 100 each 3   acetaminophen (TYLENOL) 500 MG tablet Take 2 tablets (1,000 mg total) by mouth every 6 (six) hours as needed. 30 tablet 0   acyclovir (ZOVIRAX) 400 MG tablet Take 1 tablet (400 mg total) by mouth 2 (two) times  daily. 60 tablet 11   amLODipine (NORVASC) 10 MG tablet Take 1 tablet (10 mg total) by mouth daily. 90 tablet 1   aspirin EC 81 MG tablet Take 81 mg by mouth daily. Swallow whole.     b complex vitamins capsule Take 1 capsule by mouth daily. 30 capsule 5   Blood Glucose Monitoring Suppl (ACCU-CHEK GUIDE) w/Device KIT Use to test blood sugar three times daily 1 kit 0   Continuous Blood Gluc Receiver (FREESTYLE LIBRE READER) DEVI Use to check blood sugar 3x daily. E11.42 1 each 0   Continuous Blood Gluc Sensor (FREESTYLE LIBRE SENSOR SYSTEM) MISC Use to check blood sugar 3 times daily. Change sensor every 2 weeks. 2 each 12   dexamethasone (DECADRON) 4 MG tablet 20mg  (5 tabs) Take the day after each dose of daratumumab. Take with breakfast 20 tablet 11   diclofenac Sodium (VOLTAREN) 1 % GEL Apply 4 g topically 4 (four) times daily. 150 g 5   donepezil (ARICEPT) 5 MG tablet TAKE 1 TABLET (5 MG TOTAL) BY MOUTH AT BEDTIME. FOR MEMORY ISSUES 30 tablet 5   ferrous sulfate 325 (65 FE) MG tablet Take 325 mg by mouth daily.     furosemide (LASIX) 20 MG tablet Take 1 tablet (20 mg  total) by mouth daily in the morning for 7 days (until 09/08/22). Save remaining tablets. 30 tablet 0   gabapentin (NEURONTIN) 300 MG capsule Take 1 capsule (300 mg total) by mouth every morning AND 1 capsule (300 mg total) daily at 12 noon AND 2 capsules (600 mg total) at bedtime. Stop Lyrica. 120 capsule 11   glucose blood test strip Use to test blood sugar 3 times daily. 100 each 2   hydrOXYzine (ATARAX) 25 MG tablet TAKE 1 TABLET (25 MG TOTAL) BY MOUTH 3 (THREE) TIMES DAILY AS NEEDED FOR ITCHING (RASH). 30 tablet 0   Insulin Lispro Prot & Lispro (HUMALOG MIX 75/25 KWIKPEN) (75-25) 100 UNIT/ML Kwikpen Inject 9 units subcutaneously with breakfast and dinner. Increase to 12 units for 3-5 days twice a month after dexamethasone/decadron. 15 mL 11   Insulin Pen Needle (INSUPEN PEN NEEDLES) 32G X 4 MM MISC USE AS DIRECTED TWICE DAILY WITH INSULIN 100 each 1   ketoconazole (NIZORAL) 2 % cream Apply to skin of feet twice daily 60 g 2   lidocaine (LIDODERM) 5 % Place 2 patches onto the skin daily. 12 hours on; 12 hours off!!! Can put on neck or back for pain- Remove & Discard patch within 12 hours or as directed by MD 60 patch 5   Miconazole Nitrate (ATHLETES FOOT POWDER SPRAY) 2 % AERP Spray between toes and under toes once daily. 130 g 4   Multiple Vitamin (MULTIVITAMIN WITH MINERALS) TABS tablet Take 1 tablet by mouth daily. 60 tablet 2   ondansetron (ZOFRAN) 8 MG tablet Take 1 tablet (8 mg total) by mouth 2 (two) times daily as needed (Nausea or vomiting). 30 tablet 1   polyethylene glycol (MIRALAX) 17 g packet Take 17 g by mouth daily. 30 each 2   pomalidomide (POMALYST) 2 MG capsule Take 1 capsule (2 mg total) by mouth daily. 3 weeks on 1 week off 21 capsule 0   potassium chloride SA (KLOR-CON M) 20 MEQ tablet Take 1 tablet (20 mEq total) by mouth daily. 30 tablet 1   pravastatin (PRAVACHOL) 20 MG tablet Take 1 tablet (20 mg total) by mouth daily. 90 tablet 1   prochlorperazine (COMPAZINE) 10 MG tablet  Take 1 tablet (10 mg total) by mouth every 6 (six) hours as needed (Nausea or vomiting). 30 tablet 1   tadalafil (CIALIS) 20 MG tablet Take 20 mg by mouth as needed.     tiZANidine (ZANAFLEX) 4 MG tablet Take 0.5-1 tablets (2-4 mg total) by mouth every 8 (eight) hours as needed for muscle spasms. 60 tablet 3   valsartan (DIOVAN) 40 MG tablet Take 1 tablet (40 mg total) by mouth daily. STOP LISINOPRIL 90 tablet 3   vitamin B-12 (CYANOCOBALAMIN) 100 MCG tablet Take 100 mcg by mouth daily.     No current facility-administered medications for this visit.    REVIEW OF SYSTEMS:  10 Point review of Systems was done is negative except as noted above.   PHYSICAL EXAMINATION: .BP (!) 99/52   Pulse 66   Temp 97.7 F (36.5 C)   Resp 18   Wt 162 lb 14.4 oz (73.9 kg)   SpO2 100%   BMI 26.29 kg/m   GENERAL:alert, in no acute distress and comfortable SKIN: no acute rashes, no significant lesions EYES: conjunctiva are pink and non-injected, sclera anicteric OROPHARYNX: MMM, no exudates, no oropharyngeal erythema or ulceration NECK: supple, no JVD LYMPH:  no palpable lymphadenopathy in the cervical, axillary or inguinal regions LUNGS: clear to auscultation b/l with normal respiratory effort HEART: regular rate & rhythm ABDOMEN:  normoactive bowel sounds , non tender, not distended. Extremity: no pedal edema PSYCH: alert & oriented x 3 with fluent speech NEURO: no focal motor/sensory deficits   LABORATORY DATA:  I have reviewed the data as listed  .    Latest Ref Rng & Units 06/20/2023    8:19 AM 06/06/2023   12:45 PM 05/23/2023    9:42 AM  CBC  WBC 4.0 - 10.5 K/uL 3.8  4.3  4.0   Hemoglobin 13.0 - 17.0 g/dL 9.8  47.4  9.5   Hematocrit 39.0 - 52.0 % 29.3  29.8  27.7   Platelets 150 - 400 K/uL 182  269  184       Latest Ref Rng & Units 06/20/2023    8:19 AM 06/06/2023   12:45 PM 05/23/2023    9:42 AM  CMP  Glucose 70 - 99 mg/dL 259  563  875   BUN 8 - 23 mg/dL 29  18  9     Creatinine 0.61 - 1.24 mg/dL 6.43  3.29  5.18   Sodium 135 - 145 mmol/L 137  138  138   Potassium 3.5 - 5.1 mmol/L 4.3  3.8  3.9   Chloride 98 - 111 mmol/L 104  106  106   CO2 22 - 32 mmol/L 22  25  27    Calcium 8.9 - 10.3 mg/dL 9.1  84.1  9.4   Total Protein 6.5 - 8.1 g/dL 6.1  7.0  6.2   Total Bilirubin <1.2 mg/dL 0.8  0.6  0.6   Alkaline Phos 38 - 126 U/L 77  74  61   AST 15 - 41 U/L 17  15  15    ALT 0 - 44 U/L 17  16  14       08/29/17 BM Bx:    08/29/17 Cytogenetics:   03/13/17 Cytogenetics:        PATHOLOGY Surgical Pathology  CASE: [id number]  PATIENT: Londen Strub  Bone Marrow Report      Clinical History: Multiple myeloma in relapse (HCC) ,(BH)      DIAGNOSIS:   BONE MARROW, ASPIRATE, CLOT, CORE:  -  Hypercellular bone marrow for age with plasma cell neoplasm  -See comment   PERIPHERAL BLOOD:  -Mild normocytic-normochromic anemia  -Eosinophilia   COMMENT:   The bone marrow is hypercellular for age with increased number of plasma  cells representing 8% of all cells in the aspirate associated with  numerous small clusters in the clot and biopsy sections. The plasma  cells display lambda light chain restriction consistent with plasma cell  neoplasm.  The background shows trilineage hematopoiesis with generally  nonspecific myeloid changes, likely secondary in nature in this setting.  Correlation with cytogenetic and FISH studies is recommended.      RADIOGRAPHIC STUDIES: I have personally reviewed the radiological images as listed and agreed with the findings in the report. No results found.  ASSESSMENT & PLAN:   77 y.o. male with   1.  Relapsed IgA Lambda Multiple Myeloma ISS Stage I    Active disease was previously diagnosed based on presence of anemia, kidney insufficiency, and paraproteinemia with significant predominance of lambda light chains as well as significant elevation of IgA. Patient is status post patient was treated with  induction bortezomib Revlimid dexamethasone.  Had issues with skin rashes with Revlimid and this was discontinued.  Completed bortezomib dexamethasone induction and achieved complete remission. Was on maintenance Velcade 2 weeks which was then switched to maintenance Ninlaro. Patient had issues with worsening neuropathy which was a combination of Velcade and his diabetes.  Ninlaro was discontinued as well after maintenance of more than 2 years.  2.  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 Bone marrow biopsy shows 8% lambda restricted plasma cells PET CT scan with no hypermetabolic osseous lesions  3.  Hypertension 4.  Diabetes type 2 5 .chronic kidney disease due to hypertension and diabetes. 6.  Peripheral neuropathy related to diabetes and previous myeloma treatments.  PLAN:  -Discussed lab results on 06/20/23 in detail with patient. CBC stable, showed WBC of 3.8K, hemoglobin stable at 9.8, and platelets of 182K. -CMP does show signs of CKD and suggests some mild dehydration -myeloma panel from two weeks ago showed stable M protein level of 0.2  g/dL, possibly from daratumumab treatment. -no clinical sign or evidence of myeloma progression at this time  -patient has no toxicities with Daratumumab and Pomalidomide.  -continue current daratumumab/pomalidomide/dexamethasone treatment -continue daratumumab every two weeks -will cut amlodipine to half a tablet (5 MG) a day; advised patient to inform his PCP of this change and to adjust blood pressure medications if needed -educated patient that even if he is slightly dehydrated, valsartan is known to drop blood pressure quite a bit.  -educated patient that certain medications including amlodipine and valsartan can cause dizziness.  -recommend patient to drink 4-5 16-ounce bottles of water daily to improve dizziness -recommend patient to stay  well-hydrated -answered all of patient's questions in detail  FOLLOW-UP: Continue daratumumab pomalidomide dexamethasone as per integrated scheduling. Labs with each treatment MD visit in 4 weeks  The total time spent in the appointment was 30 minutes* .  All of the patient's questions were answered with apparent satisfaction. The patient knows to call the clinic with any problems, questions or concerns.   Wyvonnia Lora MD MS AAHIVMS [facility] Hematology/Oncology Physician [facility]  .*Total Encounter Time as defined by the Centers for Medicare and Medicaid Services includes, in addition to the face-to-face time of a patient visit (documented in the  note above) non-face-to-face time: obtaining and reviewing outside history, ordering and reviewing medications, tests or procedures, care coordination (communications with other health care professionals or caregivers) and documentation in the medical record.    I,Mitra Faeizi,acting as a Neurosurgeon for Wyvonnia Lora, MD.,have documented all relevant documentation on the behalf of Wyvonnia Lora, MD,as directed by  Wyvonnia Lora, MD while in the presence of Wyvonnia Lora, MD.  .I have reviewed the above documentation for accuracy and completeness, and I agree with the above. Johney Maine MD

## 2023-06-20 NOTE — Progress Notes (Signed)
Patient seen by Dr. Addison Naegeli are not all within treatment parameters. BP 99/52 Dr Candise Che aware , pt slightly dizzy this am and has happened before. Per Dr Candise Che PCP needs to evaluate pt's BP meds. This RN will contact PCP.   Pt encouraged to drink more water and eat something salty.   Labs reviewed: and are not all within treatment parameters. Dr Candise Che aware CR: 1.78, BUN 29,   Per physician team, patient is ready for treatment and there are NO modifications to the treatment plan.

## 2023-06-20 NOTE — Patient Instructions (Signed)
 [facility] - A DEPT OF [facility]  Discharge Instructions: Thank you for choosing [facility] to provide your oncology and hematology care.   If you have a lab appointment with the Cancer Center, please go directly to the Cancer Center and check in at the registration area.   Wear comfortable clothing and clothing appropriate for easy access to any Portacath or PICC line.   We strive to give you quality time with your provider. You may need to reschedule your appointment if you arrive late (15 or more minutes).  Arriving late affects you and other patients whose appointments are after yours.  Also, if you miss three or more appointments without notifying the office, you may be dismissed from the clinic at the provider's discretion.      For prescription refill requests, have your pharmacy contact our office and allow 72 hours for refills to be completed.    Today you received the following chemotherapy and/or immunotherapy agents: daratumumab-hyaluronidase-fihj      To help prevent nausea and vomiting after your treatment, we encourage you to take your nausea medication as directed.  BELOW ARE SYMPTOMS THAT SHOULD BE REPORTED IMMEDIATELY: *FEVER GREATER THAN 100.4 F (38 C) OR HIGHER *CHILLS OR SWEATING *NAUSEA AND VOMITING THAT IS NOT CONTROLLED WITH YOUR NAUSEA MEDICATION *UNUSUAL SHORTNESS OF BREATH *UNUSUAL BRUISING OR BLEEDING *URINARY PROBLEMS (pain or burning when urinating, or frequent urination) *BOWEL PROBLEMS (unusual diarrhea, constipation, pain near the anus) TENDERNESS IN MOUTH AND THROAT WITH OR WITHOUT PRESENCE OF ULCERS (sore throat, sores in mouth, or a toothache) UNUSUAL RASH, SWELLING OR PAIN  UNUSUAL VAGINAL DISCHARGE OR ITCHING   Items with * indicate a potential emergency and should be followed up as soon as possible or go to the Emergency Department if any problems should occur.  Please show the CHEMOTHERAPY ALERT  CARD or IMMUNOTHERAPY ALERT CARD at check-in to the Emergency Department and triage nurse.  Should you have questions after your visit or need to cancel or reschedule your appointment, please contact [facility] - A DEPT OF [facility]  Dept: [phone]  and follow the prompts.  Office hours are 8:00 a.m. to 4:30 p.m. Monday - Friday. Please note that voicemails left after 4:00 p.m. may not be returned until the following business day.  We are closed weekends and major holidays. You have access to a nurse at all times for urgent questions. Please call the main number to the clinic Dept: [phone] and follow the prompts.   For any non-urgent questions, you may also contact your provider using MyChart. We now offer e-Visits for anyone 13 and older to request care online for non-urgent symptoms. For details visit [email or web address].   Also download the MyChart app! Go to the app store, search "MyChart", open the app, select [place], and log in with your MyChart username and password.

## 2023-06-25 ENCOUNTER — Other Ambulatory Visit: Payer: Self-pay

## 2023-06-26 ENCOUNTER — Encounter: Payer: Self-pay | Admitting: Hematology

## 2023-07-05 ENCOUNTER — Inpatient Hospital Stay: Payer: 59

## 2023-07-05 ENCOUNTER — Other Ambulatory Visit: Payer: Self-pay

## 2023-07-05 VITALS — BP 161/77 | HR 68 | Temp 98.0°F | Resp 18 | Wt 165.8 lb

## 2023-07-05 DIAGNOSIS — C9002 Multiple myeloma in relapse: Secondary | ICD-10-CM

## 2023-07-05 DIAGNOSIS — Z7189 Other specified counseling: Secondary | ICD-10-CM

## 2023-07-05 DIAGNOSIS — Z5112 Encounter for antineoplastic immunotherapy: Secondary | ICD-10-CM | POA: Diagnosis not present

## 2023-07-05 LAB — CBC WITH DIFFERENTIAL (CANCER CENTER ONLY)
Abs Immature Granulocytes: 0.02 10*3/uL (ref 0.00–0.07)
Basophils Absolute: 0.1 10*3/uL (ref 0.0–0.1)
Basophils Relative: 2 %
Eosinophils Absolute: 0.1 10*3/uL (ref 0.0–0.5)
Eosinophils Relative: 3 %
HCT: 29.3 % — ABNORMAL LOW (ref 39.0–52.0)
Hemoglobin: 9.9 g/dL — ABNORMAL LOW (ref 13.0–17.0)
Immature Granulocytes: 1 %
Lymphocytes Relative: 32 %
Lymphs Abs: 1.3 10*3/uL (ref 0.7–4.0)
MCH: 33.7 pg (ref 26.0–34.0)
MCHC: 33.8 g/dL (ref 30.0–36.0)
MCV: 99.7 fL (ref 80.0–100.0)
Monocytes Absolute: 0.5 10*3/uL (ref 0.1–1.0)
Monocytes Relative: 13 %
Neutro Abs: 2 10*3/uL (ref 1.7–7.7)
Neutrophils Relative %: 49 %
Platelet Count: 253 10*3/uL (ref 150–400)
RBC: 2.94 MIL/uL — ABNORMAL LOW (ref 4.22–5.81)
RDW: 16.1 % — ABNORMAL HIGH (ref 11.5–15.5)
WBC Count: 4.1 10*3/uL (ref 4.0–10.5)
nRBC: 0 % (ref 0.0–0.2)

## 2023-07-05 LAB — CMP (CANCER CENTER ONLY)
ALT: 20 U/L (ref 0–44)
AST: 15 U/L (ref 15–41)
Albumin: 3.9 g/dL (ref 3.5–5.0)
Alkaline Phosphatase: 58 U/L (ref 38–126)
Anion gap: 5 (ref 5–15)
BUN: 15 mg/dL (ref 8–23)
CO2: 27 mmol/L (ref 22–32)
Calcium: 9.6 mg/dL (ref 8.9–10.3)
Chloride: 108 mmol/L (ref 98–111)
Creatinine: 1.43 mg/dL — ABNORMAL HIGH (ref 0.61–1.24)
GFR, Estimated: 51 mL/min — ABNORMAL LOW (ref >60)
Glucose, Bld: 193 mg/dL — ABNORMAL HIGH (ref 70–99)
Potassium: 3.8 mmol/L (ref 3.5–5.1)
Sodium: 140 mmol/L (ref 135–145)
Total Bilirubin: 0.5 mg/dL (ref <1.2)
Total Protein: 6.4 g/dL — ABNORMAL LOW (ref 6.5–8.1)

## 2023-07-05 MED ORDER — DARATUMUMAB-HYALURONIDASE-FIHJ 1800-30000 MG-UT/15ML ~~LOC~~ SOLN
1800.0000 mg | Freq: Once | SUBCUTANEOUS | Status: AC
  Administered 2023-07-05: 1800 mg via SUBCUTANEOUS
  Filled 2023-07-05: qty 15

## 2023-07-05 MED ORDER — DEXAMETHASONE 4 MG PO TABS
20.0000 mg | ORAL_TABLET | Freq: Once | ORAL | Status: AC
Start: 2023-07-05 — End: 2023-07-05
  Administered 2023-07-05: 20 mg via ORAL
  Filled 2023-07-05: qty 5

## 2023-07-05 MED ORDER — FAMOTIDINE 20 MG PO TABS
20.0000 mg | ORAL_TABLET | Freq: Once | ORAL | Status: AC
Start: 2023-07-05 — End: 2023-07-05
  Administered 2023-07-05: 20 mg via ORAL
  Filled 2023-07-05: qty 1

## 2023-07-05 MED ORDER — DIPHENHYDRAMINE HCL 25 MG PO CAPS
50.0000 mg | ORAL_CAPSULE | Freq: Once | ORAL | Status: AC
  Administered 2023-07-05: 50 mg via ORAL
  Filled 2023-07-05: qty 2

## 2023-07-05 MED ORDER — ACETAMINOPHEN 325 MG PO TABS
650.0000 mg | ORAL_TABLET | Freq: Once | ORAL | Status: AC
  Administered 2023-07-05: 650 mg via ORAL
  Filled 2023-07-05: qty 2

## 2023-07-05 NOTE — Patient Instructions (Signed)

## 2023-07-06 LAB — KAPPA/LAMBDA LIGHT CHAINS
Kappa free light chain: 46 mg/L — ABNORMAL HIGH (ref 3.3–19.4)
Kappa, lambda light chain ratio: 2.54 — ABNORMAL HIGH (ref 0.26–1.65)
Lambda free light chains: 18.1 mg/L (ref 5.7–26.3)

## 2023-07-09 ENCOUNTER — Ambulatory Visit (INDEPENDENT_AMBULATORY_CARE_PROVIDER_SITE_OTHER): Payer: 59 | Admitting: Podiatry

## 2023-07-09 ENCOUNTER — Encounter: Payer: Self-pay | Admitting: Podiatry

## 2023-07-09 DIAGNOSIS — M2012 Hallux valgus (acquired), left foot: Secondary | ICD-10-CM

## 2023-07-09 DIAGNOSIS — L84 Corns and callosities: Secondary | ICD-10-CM

## 2023-07-09 DIAGNOSIS — E1142 Type 2 diabetes mellitus with diabetic polyneuropathy: Secondary | ICD-10-CM

## 2023-07-09 DIAGNOSIS — M79674 Pain in right toe(s): Secondary | ICD-10-CM | POA: Diagnosis not present

## 2023-07-09 DIAGNOSIS — B351 Tinea unguium: Secondary | ICD-10-CM | POA: Diagnosis not present

## 2023-07-09 DIAGNOSIS — M2011 Hallux valgus (acquired), right foot: Secondary | ICD-10-CM

## 2023-07-09 DIAGNOSIS — M79675 Pain in left toe(s): Secondary | ICD-10-CM | POA: Diagnosis not present

## 2023-07-09 NOTE — Progress Notes (Signed)
This patient presents to the office with chief complaint of long thick painful nails.  Patient says the nails are painful walking and wearing shoes.  This patient is unable to self treat.  This patient is unable to trim his nails since he is unable to reach his nails.  She presents to the office for preventative foot care services.  General Appearance  Alert, conversant and in no acute stress.  Vascular  Dorsalis pedis and posterior tibial  pulses are palpable  bilaterally.  Capillary return is within normal limits  bilaterally. Temperature is within normal limits  bilaterally.  Neurologic  Senn-Weinstein monofilament wire test within normal limits  bilaterally. Muscle power within normal limits bilaterally.  Nails Thick disfigured discolored nails with subungual debris  from hallux to fifth toes bilaterally. No evidence of bacterial infection or drainage bilaterally.  Orthopedic  No limitations of motion  feet .  No crepitus or effusions noted.  No bony pathology or digital deformities noted.  Skin  normotropic skin with no porokeratosis noted bilaterally.  No signs of infections or ulcers noted.   Callus plantar right hallux.  Onychomycosis  Nails  B/L.  Pain in right toes  Pain in left toes  Callus right hallux.  Debridement of nails both feet followed trimming the nails with dremel tool.  Debride callus with # 15 blade and dremel tool.  RTC 10 weeks    Helane Gunther DPM

## 2023-07-10 ENCOUNTER — Other Ambulatory Visit: Payer: Self-pay

## 2023-07-12 ENCOUNTER — Other Ambulatory Visit: Payer: Self-pay

## 2023-07-15 LAB — MULTIPLE MYELOMA PANEL, SERUM
Albumin SerPl Elph-Mcnc: 3.5 g/dL (ref 2.9–4.4)
Albumin/Glob SerPl: 1.6 (ref 0.7–1.7)
Alpha 1: 0.2 g/dL (ref 0.0–0.4)
Alpha2 Glob SerPl Elph-Mcnc: 0.8 g/dL (ref 0.4–1.0)
B-Globulin SerPl Elph-Mcnc: 0.8 g/dL (ref 0.7–1.3)
Gamma Glob SerPl Elph-Mcnc: 0.5 g/dL (ref 0.4–1.8)
Globulin, Total: 2.3 g/dL (ref 2.2–3.9)
IgA: 114 mg/dL (ref 61–437)
IgG (Immunoglobin G), Serum: 603 mg/dL (ref 603–1613)
IgM (Immunoglobulin M), Srm: 29 mg/dL (ref 15–143)
M Protein SerPl Elph-Mcnc: 0.1 g/dL — ABNORMAL HIGH
Total Protein ELP: 5.8 g/dL — ABNORMAL LOW (ref 6.0–8.5)

## 2023-07-16 ENCOUNTER — Other Ambulatory Visit: Payer: Self-pay

## 2023-07-16 DIAGNOSIS — C9002 Multiple myeloma in relapse: Secondary | ICD-10-CM

## 2023-07-16 MED ORDER — POMALIDOMIDE 2 MG PO CAPS: 2.0000 mg | ORAL_CAPSULE | Freq: Every day | ORAL | 0 refills | Status: DC

## 2023-07-18 ENCOUNTER — Inpatient Hospital Stay: Payer: 59

## 2023-07-18 ENCOUNTER — Inpatient Hospital Stay: Payer: 59 | Attending: Hematology

## 2023-07-18 ENCOUNTER — Inpatient Hospital Stay (HOSPITAL_BASED_OUTPATIENT_CLINIC_OR_DEPARTMENT_OTHER): Payer: 59 | Admitting: Hematology

## 2023-07-18 VITALS — BP 140/59 | HR 67 | Temp 97.7°F | Resp 18 | Ht 66.0 in | Wt 164.6 lb

## 2023-07-18 DIAGNOSIS — D892 Hypergammaglobulinemia, unspecified: Secondary | ICD-10-CM | POA: Diagnosis not present

## 2023-07-18 DIAGNOSIS — Z7189 Other specified counseling: Secondary | ICD-10-CM

## 2023-07-18 DIAGNOSIS — C9002 Multiple myeloma in relapse: Secondary | ICD-10-CM

## 2023-07-18 DIAGNOSIS — I129 Hypertensive chronic kidney disease with stage 1 through stage 4 chronic kidney disease, or unspecified chronic kidney disease: Secondary | ICD-10-CM | POA: Diagnosis not present

## 2023-07-18 DIAGNOSIS — Z5112 Encounter for antineoplastic immunotherapy: Secondary | ICD-10-CM | POA: Insufficient documentation

## 2023-07-18 DIAGNOSIS — Z5111 Encounter for antineoplastic chemotherapy: Secondary | ICD-10-CM | POA: Diagnosis not present

## 2023-07-18 DIAGNOSIS — E1122 Type 2 diabetes mellitus with diabetic chronic kidney disease: Secondary | ICD-10-CM | POA: Diagnosis not present

## 2023-07-18 DIAGNOSIS — N183 Chronic kidney disease, stage 3 unspecified: Secondary | ICD-10-CM | POA: Diagnosis not present

## 2023-07-18 DIAGNOSIS — D649 Anemia, unspecified: Secondary | ICD-10-CM | POA: Insufficient documentation

## 2023-07-18 LAB — CMP (CANCER CENTER ONLY)
ALT: 17 U/L (ref 0–44)
AST: 15 U/L (ref 15–41)
Albumin: 3.8 g/dL (ref 3.5–5.0)
Alkaline Phosphatase: 74 U/L (ref 38–126)
Anion gap: 8 (ref 5–15)
BUN: 17 mg/dL (ref 8–23)
CO2: 25 mmol/L (ref 22–32)
Calcium: 9.5 mg/dL (ref 8.9–10.3)
Chloride: 108 mmol/L (ref 98–111)
Creatinine: 1.62 mg/dL — ABNORMAL HIGH (ref 0.61–1.24)
GFR, Estimated: 44 mL/min — ABNORMAL LOW (ref >60)
Glucose, Bld: 197 mg/dL — ABNORMAL HIGH (ref 70–99)
Potassium: 3.9 mmol/L (ref 3.5–5.1)
Sodium: 141 mmol/L (ref 135–145)
Total Bilirubin: 0.7 mg/dL (ref 0.0–1.2)
Total Protein: 6.2 g/dL — ABNORMAL LOW (ref 6.5–8.1)

## 2023-07-18 LAB — CBC WITH DIFFERENTIAL (CANCER CENTER ONLY)
Abs Immature Granulocytes: 0.01 10*3/uL (ref 0.00–0.07)
Basophils Absolute: 0.1 10*3/uL (ref 0.0–0.1)
Basophils Relative: 2 %
Eosinophils Absolute: 0.2 10*3/uL (ref 0.0–0.5)
Eosinophils Relative: 5 %
HCT: 30 % — ABNORMAL LOW (ref 39.0–52.0)
Hemoglobin: 10.1 g/dL — ABNORMAL LOW (ref 13.0–17.0)
Immature Granulocytes: 0 %
Lymphocytes Relative: 32 %
Lymphs Abs: 1.4 10*3/uL (ref 0.7–4.0)
MCH: 33.1 pg (ref 26.0–34.0)
MCHC: 33.7 g/dL (ref 30.0–36.0)
MCV: 98.4 fL (ref 80.0–100.0)
Monocytes Absolute: 0.6 10*3/uL (ref 0.1–1.0)
Monocytes Relative: 13 %
Neutro Abs: 2.1 10*3/uL (ref 1.7–7.7)
Neutrophils Relative %: 48 %
Platelet Count: 228 10*3/uL (ref 150–400)
RBC: 3.05 MIL/uL — ABNORMAL LOW (ref 4.22–5.81)
RDW: 15.6 % — ABNORMAL HIGH (ref 11.5–15.5)
WBC Count: 4.4 10*3/uL (ref 4.0–10.5)
nRBC: 0 % (ref 0.0–0.2)

## 2023-07-18 MED ORDER — DEXAMETHASONE 4 MG PO TABS
20.0000 mg | ORAL_TABLET | Freq: Once | ORAL | Status: AC
  Administered 2023-07-18: 20 mg via ORAL
  Filled 2023-07-18: qty 5

## 2023-07-18 MED ORDER — DIPHENHYDRAMINE HCL 25 MG PO CAPS
50.0000 mg | ORAL_CAPSULE | Freq: Once | ORAL | Status: AC
  Administered 2023-07-18: 50 mg via ORAL
  Filled 2023-07-18: qty 2

## 2023-07-18 MED ORDER — DARATUMUMAB-HYALURONIDASE-FIHJ 1800-30000 MG-UT/15ML ~~LOC~~ SOLN
1800.0000 mg | Freq: Once | SUBCUTANEOUS | Status: AC
  Administered 2023-07-18: 1800 mg via SUBCUTANEOUS
  Filled 2023-07-18: qty 15

## 2023-07-18 MED ORDER — ACETAMINOPHEN 325 MG PO TABS
650.0000 mg | ORAL_TABLET | Freq: Once | ORAL | Status: AC
  Administered 2023-07-18: 650 mg via ORAL
  Filled 2023-07-18: qty 2

## 2023-07-18 MED ORDER — FAMOTIDINE 20 MG PO TABS
20.0000 mg | ORAL_TABLET | Freq: Once | ORAL | Status: AC
Start: 2023-07-18 — End: 2023-07-18
  Administered 2023-07-18: 20 mg via ORAL
  Filled 2023-07-18: qty 1

## 2023-07-18 NOTE — Patient Instructions (Signed)
 [facility] - A DEPT OF [facility]  Discharge Instructions: Thank you for choosing [facility] to provide your oncology and hematology care.   If you have a lab appointment with the Cancer Center, please go directly to the Cancer Center and check in at the registration area.   Wear comfortable clothing and clothing appropriate for easy access to any Portacath or PICC line.   We strive to give you quality time with your provider. You may need to reschedule your appointment if you arrive late (15 or more minutes).  Arriving late affects you and other patients whose appointments are after yours.  Also, if you miss three or more appointments without notifying the office, you may be dismissed from the clinic at the provider's discretion.      For prescription refill requests, have your pharmacy contact our office and allow 72 hours for refills to be completed.    Today you received the following chemotherapy and/or immunotherapy agents: Dara Faspro.       To help prevent nausea and vomiting after your treatment, we encourage you to take your nausea medication as directed.  BELOW ARE SYMPTOMS THAT SHOULD BE REPORTED IMMEDIATELY: *FEVER GREATER THAN 100.4 F (38 C) OR HIGHER *CHILLS OR SWEATING *NAUSEA AND VOMITING THAT IS NOT CONTROLLED WITH YOUR NAUSEA MEDICATION *UNUSUAL SHORTNESS OF BREATH *UNUSUAL BRUISING OR BLEEDING *URINARY PROBLEMS (pain or burning when urinating, or frequent urination) *BOWEL PROBLEMS (unusual diarrhea, constipation, pain near the anus) TENDERNESS IN MOUTH AND THROAT WITH OR WITHOUT PRESENCE OF ULCERS (sore throat, sores in mouth, or a toothache) UNUSUAL RASH, SWELLING OR PAIN  UNUSUAL VAGINAL DISCHARGE OR ITCHING   Items with * indicate a potential emergency and should be followed up as soon as possible or go to the Emergency Department if any problems should occur.  Please show the CHEMOTHERAPY ALERT CARD or  IMMUNOTHERAPY ALERT CARD at check-in to the Emergency Department and triage nurse.  Should you have questions after your visit or need to cancel or reschedule your appointment, please contact [facility]  Dept: [phone]  and follow the prompts.  Office hours are 8:00 a.m. to 4:30 p.m. Monday - Friday. Please note that voicemails left after 4:00 p.m. may not be returned until the following business day.  We are closed weekends and major holidays. You have access to a nurse at all times for urgent questions. Please call the main number to the clinic Dept: [phone] and follow the prompts.   For any non-urgent questions, you may also contact your provider using MyChart. We now offer e-Visits for anyone 12 and older to request care online for non-urgent symptoms. For details visit [email or web address].   Also download the MyChart app! Go to the app store, search "MyChart", open the app, select , and log in with your MyChart username and password.

## 2023-07-18 NOTE — Progress Notes (Signed)
 HEMATOLOGY/ONCOLOGY CLINIC NOTE  Date of Service: 07/18/23   Patient Care Team: Vicci Barnie NOVAK, MD as PCP - General (Internal Medicine)  CHIEF COMPLAINTS/PURPOSE OF CONSULTATION:  Follow-up for continued evaluation and management of relapsed multiple myeloma  Oncologic History:  78 y.o. male with diagnosis of IgA lambda active multiple myeloma, ISS Stage I. Active disease diagnosed based on presence of anemia, kidney insufficiency, and paraproteinemia with significant predominance of lambda light chains as well as significant elevation of IgA. Bone marrow biopsy confirmed presence of atypical monoclonal plasma cell process in the bone marrow comprising at least 7% of the cellularity. Based on the findings, patient was started on treatment with lenalidomide , bortezomib , and low-dose dexamethasone  based on the anticipated tolerance by the patient. Lenalidomide  was started at 10 mg daily based on creatinine clearance of 42, dexamethasone  dose was reduced to 20 mg weekly based on patient's age. Treatment was complicated by rapid development of cutaneous rash attributed to lenalidomide , inaddition to decreased renal function and now patient has persistent severe anemia. Side-effects were attributed to Lenalidomide  and lenalidomide  was discontinued with subsequent recovery.  Patient has been receiving bortezomib  weekly with low-dose dexamethasone  in 4-day cycles.   Patient has completed induction systemic therapy for his disease with repeat bone marrow biopsy confirming complete response including no evidence of minimal residual disease by cytogenetics or FISH.  HISTORY OF PRESENTING ILLNESS:  Please see previous note for details of initial presentation.  INTERVAL HISTORY:   [name] is a 78 y.o. male is here for continued evaluation and management of his relapsed multiple myeloma. Patient was last seen by me on 06/20/2023 and complained of dizziness sometimes when standing,  stable arthritis pains, constant night sweats, and soreness in his right knee.   Today, he reports that he has been feeling fairly well overall since his last clinical visit.   Patient complains of dizziness. His blood pressure in clinic today is 140/59. He reports that his blood sugars are sometimes 99 at the lowest.   He denies any back pain, new bone pains, or leg swelling.   Patient sometimes uses muscle relaxants for back spasms. Patient reports taking Zanaflex  twice daily. Patient continues to take Neurontin  and Lasix .   Patient reports that he is no longer taking Aricept  for memory issues as he did not notice any improvement in symptoms. He reports remembering most childhood memories.   Patient reports diarrhea sometimes and denies any nausea. He has been otherwise tolerating Pomalidomide  with no major toxicities.   He is taking aspirin  regularly with pomalidomide .   Patient reports normal appetite and does generally stay well hydrated.   He reports that he follows up with his PCP every couple of months.   MEDICAL HISTORY:  Past Medical History:  Diagnosis Date   Anemia    Arthritis    shoulders,feet    Cataract    per eye dr -has appt 5-11   CKD (chronic kidney disease), stage III (HCC)    Elevated PSA    History of ketoacidosis    03-29-2015    History of sepsis    07-25-2013  non-compliant w/ medication   Hyperlipidemia    Hypertension    Nocturia    Peripheral neuropathy    Prostate cancer (HCC)    Type 2 diabetes mellitus with insulin  therapy [facility])     SURGICAL HISTORY: Past Surgical History:  Procedure Laterality Date   CIRCUMCISION  1990's   PROSTATE BIOPSY N/A 12/21/2015  Procedure: BIOPSY TRANSRECTAL ULTRASONIC PROSTATE (TUBP);  Surgeon: Donnice Brooks, MD;  Location: [facility];  Service: Urology;  Laterality: N/A;    SOCIAL HISTORY: Social History   Socioeconomic History   Marital status: Single    Spouse name: Not on file    Number of children: Not on file   Years of education: Not on file   Highest education level: Not on file  Occupational History   Not on file  Tobacco Use   Smoking status: Every Day    Current packs/day: 0.33    Average packs/day: 0.3 packs/day for 50.0 years (16.5 ttl pk-yrs)    Types: Cigarettes   Smokeless tobacco: Never   Tobacco comments:    1 ppwk-4-5 a day   Vaping Use   Vaping status: Never Used  Substance and Sexual Activity   Alcohol use: Yes    Alcohol/week: 1.0 standard drink of alcohol    Types: 1 Cans of beer per week    Comment: a beer every now and again   Drug use: No   Sexual activity: Not Currently  Other Topics Concern   Not on file  Social History Narrative   Not on file   Social Drivers of Health   Financial Resource Strain: Low Risk  (05/11/2023)   Overall Financial Resource Strain (CARDIA)    Difficulty of Paying Living Expenses: Not hard at all  Food Insecurity: No Food Insecurity (05/11/2023)   Hunger Vital Sign    Worried About Running Out of Food in the Last Year: Never true    Ran Out of Food in the Last Year: Never true  Transportation Needs: No Transportation Needs (05/11/2023)   PRAPARE - Administrator, Civil Service (Medical): No    Lack of Transportation (Non-Medical): No  Physical Activity: Inactive (05/11/2023)   Exercise Vital Sign    Days of Exercise per Week: 0 days    Minutes of Exercise per Session: 0 min  Stress: No Stress Concern Present (05/11/2023)   [employer] of Occupational Health - Occupational Stress Questionnaire    Feeling of Stress : Not at all  Social Connections: Moderately Isolated (05/11/2023)   Social Connection and Isolation Panel [NHANES]    Frequency of Communication with Friends and Family: Three times a week    Frequency of Social Gatherings with Friends and Family: Twice a week    Attends Religious Services: Never    Database Administrator or Organizations: Yes    Attends Museum/gallery Exhibitions Officer: More than 4 times per year    Marital Status: Never married  Intimate Partner Violence: Not At Risk (05/11/2023)   Humiliation, Afraid, Rape, and Kick questionnaire    Fear of Current or Ex-Partner: No    Emotionally Abused: No    Physically Abused: No    Sexually Abused: No    FAMILY HISTORY: Family History  Problem Relation Age of Onset   Kidney disease Mother    Cancer Neg Hx    Colon cancer Neg Hx    Colon polyps Neg Hx    Esophageal cancer Neg Hx    Rectal cancer Neg Hx    Stomach cancer Neg Hx     ALLERGIES:  is allergic to other and lisinopril .  MEDICATIONS:  Current Outpatient Medications  Medication Sig Dispense Refill   Accu-Chek Softclix Lancets lancets Use to test blood sugar 3 times daily. 100 each 3   acetaminophen  (TYLENOL ) 500 MG tablet Take 2 tablets (  1,000 mg total) by mouth every 6 (six) hours as needed. 30 tablet 0   acyclovir  (ZOVIRAX ) 400 MG tablet Take 1 tablet (400 mg total) by mouth 2 (two) times daily. 60 tablet 11   amLODipine  (NORVASC ) 10 MG tablet Take 1 tablet (10 mg total) by mouth daily. 90 tablet 1   aspirin  EC 81 MG tablet Take 81 mg by mouth daily. Swallow whole.     b complex vitamins capsule Take 1 capsule by mouth daily. 30 capsule 5   Blood Glucose Monitoring Suppl (ACCU-CHEK GUIDE) w/Device KIT Use to test blood sugar three times daily 1 kit 0   Continuous Blood Gluc Receiver (FREESTYLE LIBRE READER) DEVI Use to check blood sugar 3x daily. E11.42 1 each 0   Continuous Blood Gluc Sensor (FREESTYLE LIBRE SENSOR SYSTEM) MISC Use to check blood sugar 3 times daily. Change sensor every 2 weeks. 2 each 12   dexamethasone  (DECADRON ) 4 MG tablet 20mg  (5 tabs) Take the day after each dose of daratumumab . Take with breakfast 20 tablet 11   diclofenac  Sodium (VOLTAREN ) 1 % GEL Apply 4 g topically 4 (four) times daily. 150 g 5   donepezil  (ARICEPT ) 5 MG tablet TAKE 1 TABLET (5 MG TOTAL) BY MOUTH AT BEDTIME. FOR MEMORY ISSUES 30  tablet 5   ferrous sulfate  325 (65 FE) MG tablet Take 325 mg by mouth daily.     furosemide  (LASIX ) 20 MG tablet Take 1 tablet (20 mg total) by mouth daily in the morning for 7 days (until 09/08/22). Save remaining tablets. 30 tablet 0   gabapentin  (NEURONTIN ) 300 MG capsule Take 1 capsule (300 mg total) by mouth every morning AND 1 capsule (300 mg total) daily at 12 noon AND 2 capsules (600 mg total) at bedtime. Stop Lyrica . 120 capsule 11   glucose blood test strip Use to test blood sugar 3 times daily. 100 each 2   hydrOXYzine  (ATARAX ) 25 MG tablet TAKE 1 TABLET (25 MG TOTAL) BY MOUTH 3 (THREE) TIMES DAILY AS NEEDED FOR ITCHING (RASH). 30 tablet 0   Insulin  Lispro Prot & Lispro (HUMALOG  MIX 75/25 KWIKPEN) (75-25) 100 UNIT/ML Kwikpen Inject 9 units subcutaneously with breakfast and dinner. Increase to 12 units for 3-5 days twice a month after dexamethasone /decadron . 15 mL 11   Insulin  Pen Needle (INSUPEN PEN NEEDLES) 32G X 4 MM MISC USE AS DIRECTED TWICE DAILY WITH INSULIN  100 each 1   ketoconazole  (NIZORAL ) 2 % cream Apply to skin of feet twice daily 60 g 2   lidocaine  (LIDODERM ) 5 % Place 2 patches onto the skin daily. 12 hours on; 12 hours off!!! Can put on neck or back for pain- Remove & Discard patch within 12 hours or as directed by MD 60 patch 5   Miconazole  Nitrate (ATHLETES FOOT POWDER SPRAY) 2 % AERP Spray between toes and under toes once daily. 130 g 4   Multiple Vitamin (MULTIVITAMIN WITH MINERALS) TABS tablet Take 1 tablet by mouth daily. 60 tablet 2   ondansetron  (ZOFRAN ) 8 MG tablet Take 1 tablet (8 mg total) by mouth 2 (two) times daily as needed (Nausea or vomiting). 30 tablet 1   polyethylene glycol (MIRALAX ) 17 g packet Take 17 g by mouth daily. 30 each 2   pomalidomide  (POMALYST ) 2 MG capsule Take 1 capsule (2 mg total) by mouth daily. 3 weeks on 1 week off 21 capsule 0   potassium chloride  SA (KLOR-CON  M) 20 MEQ tablet Take 1 tablet (20 mEq total) by  mouth daily. 30 tablet 1    pravastatin  (PRAVACHOL ) 20 MG tablet Take 1 tablet (20 mg total) by mouth daily. 90 tablet 1   prochlorperazine  (COMPAZINE ) 10 MG tablet Take 1 tablet (10 mg total) by mouth every 6 (six) hours as needed (Nausea or vomiting). 30 tablet 1   tadalafil  (CIALIS ) 20 MG tablet Take 20 mg by mouth as needed.     tiZANidine  (ZANAFLEX ) 4 MG tablet Take 0.5-1 tablets (2-4 mg total) by mouth every 8 (eight) hours as needed for muscle spasms. 60 tablet 3   valsartan  (DIOVAN ) 40 MG tablet Take 1 tablet (40 mg total) by mouth daily. STOP LISINOPRIL  90 tablet 3   vitamin B-12 (CYANOCOBALAMIN ) 100 MCG tablet Take 100 mcg by mouth daily.     No current facility-administered medications for this visit.    REVIEW OF SYSTEMS:  10 Point review of Systems was done is negative except as noted above.   PHYSICAL EXAMINATION: .BP (!) 140/59 (BP Location: Left Arm, Patient Position: Sitting)   Pulse 67   Temp 97.7 F (36.5 C) (Tympanic)   Resp 18   Ht 5' 6 (1.676 m)   Wt 164 lb 9.6 oz (74.7 kg)   SpO2 100%   BMI 26.57 kg/m   GENERAL:alert, in no acute distress and comfortable SKIN: no acute rashes, no significant lesions EYES: conjunctiva are pink and non-injected, sclera anicteric OROPHARYNX: MMM, no exudates, no oropharyngeal erythema or ulceration NECK: supple, no JVD LYMPH:  no palpable lymphadenopathy in the cervical, axillary or inguinal regions LUNGS: clear to auscultation b/l with normal respiratory effort HEART: regular rate & rhythm ABDOMEN:  normoactive bowel sounds , non tender, not distended. Extremity: no pedal edema PSYCH: alert & oriented x 3 with fluent speech NEURO: no focal motor/sensory deficits   LABORATORY DATA:  I have reviewed the data as listed  .    Latest Ref Rng & Units 07/18/2023   10:49 AM 07/05/2023    9:48 AM 06/20/2023    8:19 AM  CBC  WBC 4.0 - 10.5 K/uL 4.4  4.1  3.8   Hemoglobin 13.0 - 17.0 g/dL 89.8  9.9  9.8   Hematocrit 39.0 - 52.0 % 30.0  29.3  29.3    Platelets 150 - 400 K/uL 228  253  182       Latest Ref Rng & Units 07/18/2023   10:49 AM 07/05/2023    9:48 AM 06/20/2023    8:19 AM  CMP  Glucose 70 - 99 mg/dL 802  806  816   BUN 8 - 23 mg/dL 17  15  29    Creatinine 0.61 - 1.24 mg/dL 8.37  8.56  8.21   Sodium 135 - 145 mmol/L 141  140  137   Potassium 3.5 - 5.1 mmol/L 3.9  3.8  4.3   Chloride 98 - 111 mmol/L 108  108  104   CO2 22 - 32 mmol/L 25  27  22    Calcium  8.9 - 10.3 mg/dL 9.5  9.6  9.1   Total Protein 6.5 - 8.1 g/dL 6.2  6.4  6.1   Total Bilirubin 0.0 - 1.2 mg/dL 0.7  0.5  0.8   Alkaline Phos 38 - 126 U/L 74  58  77   AST 15 - 41 U/L 15  15  17    ALT 0 - 44 U/L 17  20  17       08/29/17 BM Bx:    08/29/17 Cytogenetics:   03/13/17  Cytogenetics:        PATHOLOGY Surgical Pathology  CASE: [id number]  PATIENT: Robert Sparks  Bone Marrow Report      Clinical History: Multiple myeloma in relapse (HCC) ,(BH)      DIAGNOSIS:   BONE MARROW, ASPIRATE, CLOT, CORE:  -Hypercellular bone marrow for age with plasma cell neoplasm  -See comment   PERIPHERAL BLOOD:  -Mild normocytic-normochromic anemia  -Eosinophilia   COMMENT:   The bone marrow is hypercellular for age with increased number of plasma  cells representing 8% of all cells in the aspirate associated with  numerous small clusters in the clot and biopsy sections. The plasma  cells display lambda light chain restriction consistent with plasma cell  neoplasm.  The background shows trilineage hematopoiesis with generally  nonspecific myeloid changes, likely secondary in nature in this setting.  Correlation with cytogenetic and FISH studies is recommended.      RADIOGRAPHIC STUDIES: I have personally reviewed the radiological images as listed and agreed with the findings in the report. No results found.  ASSESSMENT & PLAN:   78 y.o. male with   1.  Relapsed IgA Lambda Multiple Myeloma ISS Stage I    Active disease was previously  diagnosed based on presence of anemia, kidney insufficiency, and paraproteinemia with significant predominance of lambda light chains as well as significant elevation of IgA. Patient is status post patient was treated with induction bortezomib  Revlimid  dexamethasone .  Had issues with skin rashes with Revlimid  and this was discontinued.  Completed bortezomib  dexamethasone  induction and achieved complete remission. Was on maintenance Velcade  2 weeks which was then switched to maintenance Ninlaro . Patient had issues with worsening neuropathy which was a combination of Velcade  and his diabetes.  Ninlaro  was discontinued as well after maintenance of more than 2 years.  2.  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 Bone marrow biopsy shows 8% lambda restricted plasma cells PET CT scan with no hypermetabolic osseous lesions  3.  Hypertension 4.  Diabetes type 2 5 .chronic kidney disease due to hypertension and diabetes. 6.  Peripheral neuropathy related to diabetes and previous myeloma treatments.  PLAN:  -Discussed lab results on 07/18/23 in detail with patient. CBC showed WBC of 4.4K, hemoglobin of 10.1, and platelets of 228K. -last myeloma lab from 2 weeks ago showed that his M protein was well controlled at 0.1 g/dL -no clinical sign or evidence of myeloma progression at this time  -patient has no major toxicities with Daratumumab  and Pomalidomide .  -continue current daratumumab /pomalidomide /dexamethasone  treatment -continue daratumumab  every two weeks -educated patient that Zanaflex  can cause dizziness -educated patient that pomalidomide  typically does not cause dizziness.  -advised patient to connect with his PCP to consider simplifying his medications -answered all of patient's questions in detail  FOLLOW-UP: Per integrated scheduling  The total time spent in the appointment was 30 minutes* .  All  of the patient's questions were answered with apparent satisfaction. The patient knows to call the clinic with any problems, questions or concerns.   Emaline Saran MD MS AAHIVMS [facility] Hematology/Oncology Physician [facility]  .*Total Encounter Time as defined by the Centers for Medicare and Medicaid Services includes, in addition to the face-to-face time of a patient visit (documented in the note above) non-face-to-face time: obtaining and reviewing outside history, ordering and reviewing medications, tests or procedures, care coordination (communications with other health care professionals or caregivers) and documentation  in the medical record.   I,Mitra Faeizi,acting as a neurosurgeon for Emaline Saran, MD.,have documented all relevant documentation on the behalf of Emaline Saran, MD,as directed by  Emaline Saran, MD while in the presence of Emaline Saran, MD.  .I have reviewed the above documentation for accuracy and completeness, and I agree with the above. .Emsley Custer Kishore Oreoluwa Gilmer MD

## 2023-07-20 ENCOUNTER — Other Ambulatory Visit: Payer: Self-pay

## 2023-07-20 ENCOUNTER — Encounter: Payer: Self-pay | Admitting: Physical Medicine and Rehabilitation

## 2023-07-20 ENCOUNTER — Encounter: Payer: 59 | Attending: Physical Medicine and Rehabilitation | Admitting: Physical Medicine and Rehabilitation

## 2023-07-20 VITALS — BP 117/70 | HR 61 | Ht 66.0 in | Wt 166.0 lb

## 2023-07-20 DIAGNOSIS — M545 Low back pain, unspecified: Secondary | ICD-10-CM | POA: Insufficient documentation

## 2023-07-20 DIAGNOSIS — G8929 Other chronic pain: Secondary | ICD-10-CM | POA: Insufficient documentation

## 2023-07-20 DIAGNOSIS — N1831 Chronic kidney disease, stage 3a: Secondary | ICD-10-CM | POA: Diagnosis not present

## 2023-07-20 MED ORDER — BUPROPION HCL 75 MG PO TABS
75.0000 mg | ORAL_TABLET | Freq: Two times a day (BID) | ORAL | 3 refills | Status: DC
  Filled 2023-07-20: qty 60, 30d supply, fill #0

## 2023-07-20 MED ORDER — TIZANIDINE HCL 4 MG PO TABS
4.0000 mg | ORAL_TABLET | Freq: Three times a day (TID) | ORAL | 5 refills | Status: DC | PRN
  Filled 2023-07-20 – 2023-08-17 (×2): qty 90, 30d supply, fill #0
  Filled 2023-10-03: qty 90, 30d supply, fill #1
  Filled 2023-11-30 (×2): qty 90, 30d supply, fill #2
  Filled 2024-01-22: qty 90, 30d supply, fill #3
  Filled 2024-03-17: qty 90, 30d supply, fill #4
  Filled 2024-05-06: qty 90, 30d supply, fill #5

## 2023-07-20 NOTE — Patient Instructions (Signed)
 Patient is a 78 year old male with hx of R shoulder arthroscopy due to pain, and multiple myeloma as well as DM <7.5 last time and lumbar stenosis with neurogenic claudication.    Here for f/u  on chronic low back pain and overall  chronic pain.    Will try Wellbutrin /Bupropion  75 mg BID for smoking urges- can reduce smoking in many people- - can cause more vivid dreams-  did lower dose due to Cr of 1.6.   2. Con't Lidoderm  patches- for back pain-  can do 2 patches if need be. - can also use 2nd patch ordered for R shoulder at the SAME time as back patch- 12 hours with no patches/day.   3.  Con't Zanaflex , but increase dose to 4 mg up to 3x/day as needed for muscle spasms  4. Discussed with patient that really needs surgery to help fix back pain, due to stenosis- meds won't fix it alone.    5. F/U in 3 months f/u on C LBP.

## 2023-07-20 NOTE — Progress Notes (Signed)
 Subjective:    Patient ID: Robert Sparks, male    DOB: 03/22/1946, 78 y.o.   MRN: [id number]  HPI  Patient is a 78 year old male with hx of R shoulder arthroscopy due to pain, and multiple myeloma as well as DM <7.5 last time and lumbar stenosis with neurogenic claudication.    Here for f/u  on chronic low back pain and overall  chronic pain.     Zanaflex  helps some-  Also helps him sleep better.   Was able to get patches- they help a lot.   Can be shaving and back gets tired.   Still smoking- 1 pack will last 3 days.  Just trying to cut down-   Uses 1 patch for 12 hours/day.   Pain Inventory Average Pain 6 Pain Right Now 0 My pain is intermittent and aching  In the last 24 hours, has pain interfered with the following? General activity 6 Relation with others 0 Enjoyment of life 6 What TIME of day is your pain at its worst? varies Sleep (in general) Fair  Pain is worse with: walking Pain improves with: rest Relief from Meds:  na  Family History  Problem Relation Age of Onset   Kidney disease Mother    Cancer Neg Hx    Colon cancer Neg Hx    Colon polyps Neg Hx    Esophageal cancer Neg Hx    Rectal cancer Neg Hx    Stomach cancer Neg Hx    Social History   Socioeconomic History   Marital status: Single    Spouse name: Not on file   Number of children: Not on file   Years of education: Not on file   Highest education level: Not on file  Occupational History   Not on file  Tobacco Use   Smoking status: Every Day    Current packs/day: 0.33    Average packs/day: 0.3 packs/day for 50.0 years (16.5 ttl pk-yrs)    Types: Cigarettes   Smokeless tobacco: Never   Tobacco comments:    1 ppwk-4-5 a day   Vaping Use   Vaping status: Never Used  Substance and Sexual Activity   Alcohol use: Yes    Alcohol/week: 1.0 standard drink of alcohol    Types: 1 Cans of beer per week    Comment: a beer every now and again   Drug use: No   Sexual activity: Not  Currently  Other Topics Concern   Not on file  Social History Narrative   Not on file   Social Drivers of Health   Financial Resource Strain: Low Risk  (05/11/2023)   Overall Financial Resource Strain (CARDIA)    Difficulty of Paying Living Expenses: Not hard at all  Food Insecurity: No Food Insecurity (05/11/2023)   Hunger Vital Sign    Worried About Running Out of Food in the Last Year: Never true    Ran Out of Food in the Last Year: Never true  Transportation Needs: No Transportation Needs (05/11/2023)   PRAPARE - Administrator, Civil Service (Medical): No    Lack of Transportation (Non-Medical): No  Physical Activity: Inactive (05/11/2023)   Exercise Vital Sign    Days of Exercise per Week: 0 days    Minutes of Exercise per Session: 0 min  Stress: No Stress Concern Present (05/11/2023)   [employer] of Occupational Health - Occupational Stress Questionnaire    Feeling of Stress : Not at all  Social  Connections: Moderately Isolated (05/11/2023)   Social Connection and Isolation Panel [NHANES]    Frequency of Communication with Friends and Family: Three times a week    Frequency of Social Gatherings with Friends and Family: Twice a week    Attends Religious Services: Never    Database Administrator or Organizations: Yes    Attends Engineer, Structural: More than 4 times per year    Marital Status: Never married   Past Surgical History:  Procedure Laterality Date   CIRCUMCISION  1990's   PROSTATE BIOPSY N/A 12/21/2015   Procedure: BIOPSY TRANSRECTAL ULTRASONIC PROSTATE (TUBP);  Surgeon: Donnice Brooks, MD;  Location[facility];  Service: Urology;  Laterality: N/A;   Past Surgical History:  Procedure Laterality Date   CIRCUMCISION  1990's   PROSTATE BIOPSY N/A 12/21/2015   Procedure: BIOPSY TRANSRECTAL ULTRASONIC PROSTATE (TUBP);  Surgeon: Donnice Brooks, MD;  Location: [facility];  Service: Urology;  Laterality:  N/A;   Past Medical History:  Diagnosis Date   Anemia    Arthritis    shoulders,feet    Cataract    per eye dr -has appt 5-11   CKD (chronic kidney disease), stage III (HCC)    Elevated PSA    History of ketoacidosis    03-29-2015    History of sepsis    07-25-2013  non-compliant w/ medication   Hyperlipidemia    Hypertension    Nocturia    Peripheral neuropathy    Prostate cancer (HCC)    Type 2 diabetes mellitus with insulin  therapy (HCC)    BP 117/70   Pulse 61   Ht 5' 6 (1.676 m)   Wt 166 lb (75.3 kg)   SpO2 98%   BMI 26.79 kg/m   Opioid Risk Score:   Fall Risk Score:  `1  Depression screen [facility] 2/9     05/11/2023   10:42 AM 03/09/2023   12:47 PM 01/09/2023   10:27 AM 09/07/2022    9:16 AM 05/08/2022    9:20 AM 01/02/2022   11:06 AM 10/10/2021   10:01 AM  Depression screen [facility] 2/9  Decreased Interest 0 0 0 0 0 0 0  Down, Depressed, Hopeless 0 0 0 0 0 0 0  PHQ - 2 Score 0 0 0 0 0 0 0  Altered sleeping 1 2 0 1  0   Tired, decreased energy 0 1 0 0  0   Change in appetite 0 0 0 0  0   Feeling bad or failure about yourself  0 0 0 0  3   Trouble concentrating 0 0 0 0  0   Moving slowly or fidgety/restless 0 0 0 0  0   Suicidal thoughts 0 0 0 0  0   PHQ-9 Score 1 3 0 1  3   Difficult doing work/chores Not difficult at all           Review of Systems  Musculoskeletal:  Positive for back pain and gait problem.       Left hip pain  Neurological:  Positive for numbness.  All other systems reviewed and are negative.      Objective:   Physical Exam Awake, alert, appropriate, flat affect, using cane to ambulate, NAD Sitting up on table TTP in band across low back - less pain with flexion. More with extension.       Assessment & Plan:    Patient is a 78 year old male  with hx of R shoulder arthroscopy due to pain, and multiple myeloma as well as DM <7.5 last time and lumbar stenosis with neurogenic claudication.    Here for f/u  on chronic low back pain and  overall  chronic pain.    Will try Wellbutrin /Bupropion  75 mg BID for smoking urges- can reduce smoking in many people- - can cause more vivid dreams-  did lower dose due to Cr of 1.6.   2. Con't Lidoderm  patches- for back pain-  can do 2 patches if need be. - can also use 2nd patch ordered for R shoulder at the SAME time as back patch- 12 hours with no patches/day.   3.  Con't Zanaflex , but increase dose to 4 mg up to 3x/day as needed for muscle spasms  4. Discussed with patient that really needs surgery to help fix back pain, due to stenosis- meds won't fix it alone.    5. F/U in 3 months f/u on C LBP.    I spent a total of 23   minutes on total care today- >50% coordination of care- due to d/w pt about spinal stneosis and educating pt- also changing meds for him and d/w pt about smoking cessation.

## 2023-07-21 ENCOUNTER — Other Ambulatory Visit: Payer: Self-pay

## 2023-07-23 ENCOUNTER — Other Ambulatory Visit: Payer: Self-pay

## 2023-07-24 ENCOUNTER — Encounter: Payer: Self-pay | Admitting: Hematology

## 2023-07-25 ENCOUNTER — Other Ambulatory Visit: Payer: Self-pay

## 2023-07-27 ENCOUNTER — Other Ambulatory Visit: Payer: Self-pay

## 2023-07-27 ENCOUNTER — Other Ambulatory Visit (HOSPITAL_COMMUNITY): Payer: Self-pay

## 2023-07-30 ENCOUNTER — Other Ambulatory Visit: Payer: Self-pay

## 2023-07-31 ENCOUNTER — Other Ambulatory Visit (HOSPITAL_COMMUNITY): Payer: Self-pay

## 2023-08-01 ENCOUNTER — Inpatient Hospital Stay: Payer: 59

## 2023-08-01 VITALS — BP 132/68 | HR 63 | Temp 98.4°F | Resp 18 | Wt 166.2 lb

## 2023-08-01 DIAGNOSIS — Z5112 Encounter for antineoplastic immunotherapy: Secondary | ICD-10-CM | POA: Diagnosis not present

## 2023-08-01 DIAGNOSIS — E1122 Type 2 diabetes mellitus with diabetic chronic kidney disease: Secondary | ICD-10-CM | POA: Diagnosis not present

## 2023-08-01 DIAGNOSIS — D892 Hypergammaglobulinemia, unspecified: Secondary | ICD-10-CM | POA: Diagnosis not present

## 2023-08-01 DIAGNOSIS — C9002 Multiple myeloma in relapse: Secondary | ICD-10-CM | POA: Diagnosis not present

## 2023-08-01 DIAGNOSIS — I129 Hypertensive chronic kidney disease with stage 1 through stage 4 chronic kidney disease, or unspecified chronic kidney disease: Secondary | ICD-10-CM | POA: Diagnosis not present

## 2023-08-01 DIAGNOSIS — N183 Chronic kidney disease, stage 3 unspecified: Secondary | ICD-10-CM | POA: Diagnosis not present

## 2023-08-01 DIAGNOSIS — Z7189 Other specified counseling: Secondary | ICD-10-CM

## 2023-08-01 DIAGNOSIS — D649 Anemia, unspecified: Secondary | ICD-10-CM | POA: Diagnosis not present

## 2023-08-01 LAB — CBC WITH DIFFERENTIAL (CANCER CENTER ONLY)
Abs Immature Granulocytes: 0.01 10*3/uL (ref 0.00–0.07)
Basophils Absolute: 0.1 10*3/uL (ref 0.0–0.1)
Basophils Relative: 2 %
Eosinophils Absolute: 0.1 10*3/uL (ref 0.0–0.5)
Eosinophils Relative: 3 %
HCT: 27.6 % — ABNORMAL LOW (ref 39.0–52.0)
Hemoglobin: 9.2 g/dL — ABNORMAL LOW (ref 13.0–17.0)
Immature Granulocytes: 0 %
Lymphocytes Relative: 35 %
Lymphs Abs: 1.3 10*3/uL (ref 0.7–4.0)
MCH: 33.5 pg (ref 26.0–34.0)
MCHC: 33.3 g/dL (ref 30.0–36.0)
MCV: 100.4 fL — ABNORMAL HIGH (ref 80.0–100.0)
Monocytes Absolute: 0.6 10*3/uL (ref 0.1–1.0)
Monocytes Relative: 16 %
Neutro Abs: 1.7 10*3/uL (ref 1.7–7.7)
Neutrophils Relative %: 44 %
Platelet Count: 238 10*3/uL (ref 150–400)
RBC: 2.75 MIL/uL — ABNORMAL LOW (ref 4.22–5.81)
RDW: 15.7 % — ABNORMAL HIGH (ref 11.5–15.5)
WBC Count: 3.7 10*3/uL — ABNORMAL LOW (ref 4.0–10.5)
nRBC: 0 % (ref 0.0–0.2)

## 2023-08-01 LAB — CMP (CANCER CENTER ONLY)
ALT: 15 U/L (ref 0–44)
AST: 15 U/L (ref 15–41)
Albumin: 3.8 g/dL (ref 3.5–5.0)
Alkaline Phosphatase: 52 U/L (ref 38–126)
Anion gap: 6 (ref 5–15)
BUN: 14 mg/dL (ref 8–23)
CO2: 26 mmol/L (ref 22–32)
Calcium: 9.2 mg/dL (ref 8.9–10.3)
Chloride: 106 mmol/L (ref 98–111)
Creatinine: 1.47 mg/dL — ABNORMAL HIGH (ref 0.61–1.24)
GFR, Estimated: 49 mL/min — ABNORMAL LOW (ref >60)
Glucose, Bld: 195 mg/dL — ABNORMAL HIGH (ref 70–99)
Potassium: 3.4 mmol/L — ABNORMAL LOW (ref 3.5–5.1)
Sodium: 138 mmol/L (ref 135–145)
Total Bilirubin: 0.7 mg/dL (ref 0.0–1.2)
Total Protein: 6.3 g/dL — ABNORMAL LOW (ref 6.5–8.1)

## 2023-08-01 MED ORDER — DEXAMETHASONE 4 MG PO TABS
20.0000 mg | ORAL_TABLET | Freq: Once | ORAL | Status: AC
  Administered 2023-08-01: 20 mg via ORAL
  Filled 2023-08-01: qty 5

## 2023-08-01 MED ORDER — ACETAMINOPHEN 325 MG PO TABS
650.0000 mg | ORAL_TABLET | Freq: Once | ORAL | Status: AC
  Administered 2023-08-01: 650 mg via ORAL
  Filled 2023-08-01: qty 2

## 2023-08-01 MED ORDER — FAMOTIDINE 20 MG PO TABS
20.0000 mg | ORAL_TABLET | Freq: Once | ORAL | Status: AC
  Administered 2023-08-01: 20 mg via ORAL
  Filled 2023-08-01: qty 1

## 2023-08-01 MED ORDER — DIPHENHYDRAMINE HCL 25 MG PO CAPS
50.0000 mg | ORAL_CAPSULE | Freq: Once | ORAL | Status: AC
  Administered 2023-08-01: 50 mg via ORAL
  Filled 2023-08-01: qty 2

## 2023-08-01 MED ORDER — DARATUMUMAB-HYALURONIDASE-FIHJ 1800-30000 MG-UT/15ML ~~LOC~~ SOLN
1800.0000 mg | Freq: Once | SUBCUTANEOUS | Status: AC
  Administered 2023-08-01: 1800 mg via SUBCUTANEOUS
  Filled 2023-08-01: qty 15

## 2023-08-01 NOTE — Patient Instructions (Signed)

## 2023-08-02 LAB — KAPPA/LAMBDA LIGHT CHAINS
Kappa free light chain: 49.5 mg/L — ABNORMAL HIGH (ref 3.3–19.4)
Kappa, lambda light chain ratio: 2.07 — ABNORMAL HIGH (ref 0.26–1.65)
Lambda free light chains: 23.9 mg/L (ref 5.7–26.3)

## 2023-08-06 LAB — MULTIPLE MYELOMA PANEL, SERUM
Albumin SerPl Elph-Mcnc: 3.4 g/dL (ref 2.9–4.4)
Albumin/Glob SerPl: 1.6 (ref 0.7–1.7)
Alpha 1: 0.2 g/dL (ref 0.0–0.4)
Alpha2 Glob SerPl Elph-Mcnc: 0.7 g/dL (ref 0.4–1.0)
B-Globulin SerPl Elph-Mcnc: 0.8 g/dL (ref 0.7–1.3)
Gamma Glob SerPl Elph-Mcnc: 0.5 g/dL (ref 0.4–1.8)
Globulin, Total: 2.2 g/dL (ref 2.2–3.9)
IgA: 98 mg/dL (ref 61–437)
IgG (Immunoglobin G), Serum: 558 mg/dL — ABNORMAL LOW (ref 603–1613)
IgM (Immunoglobulin M), Srm: 31 mg/dL (ref 15–143)
M Protein SerPl Elph-Mcnc: 0.2 g/dL — ABNORMAL HIGH
Total Protein ELP: 5.6 g/dL — ABNORMAL LOW (ref 6.0–8.5)

## 2023-08-09 ENCOUNTER — Other Ambulatory Visit: Payer: Self-pay

## 2023-08-10 ENCOUNTER — Other Ambulatory Visit: Payer: Self-pay

## 2023-08-14 ENCOUNTER — Other Ambulatory Visit: Payer: Self-pay

## 2023-08-14 DIAGNOSIS — C9002 Multiple myeloma in relapse: Secondary | ICD-10-CM

## 2023-08-14 MED ORDER — POMALIDOMIDE 2 MG PO CAPS: 2.0000 mg | ORAL_CAPSULE | Freq: Every day | ORAL | 0 refills | Status: DC

## 2023-08-15 ENCOUNTER — Inpatient Hospital Stay: Payer: 59 | Attending: Hematology

## 2023-08-15 ENCOUNTER — Inpatient Hospital Stay: Payer: 59 | Admitting: Hematology

## 2023-08-17 ENCOUNTER — Other Ambulatory Visit: Payer: Self-pay

## 2023-08-20 ENCOUNTER — Other Ambulatory Visit: Payer: Self-pay

## 2023-08-24 ENCOUNTER — Other Ambulatory Visit: Payer: Self-pay

## 2023-08-24 ENCOUNTER — Other Ambulatory Visit: Payer: Self-pay | Admitting: Internal Medicine

## 2023-08-24 DIAGNOSIS — E1142 Type 2 diabetes mellitus with diabetic polyneuropathy: Secondary | ICD-10-CM

## 2023-08-27 ENCOUNTER — Encounter: Payer: Self-pay | Admitting: Hematology

## 2023-08-27 ENCOUNTER — Other Ambulatory Visit: Payer: Self-pay

## 2023-08-27 ENCOUNTER — Encounter (HOSPITAL_COMMUNITY): Payer: Self-pay

## 2023-08-27 ENCOUNTER — Other Ambulatory Visit: Payer: Self-pay | Admitting: Internal Medicine

## 2023-08-27 ENCOUNTER — Ambulatory Visit (HOSPITAL_COMMUNITY)
Admission: EM | Admit: 2023-08-27 | Discharge: 2023-08-27 | Disposition: A | Discharge status: Home / Self Care | Payer: 59 | Attending: Emergency Medicine | Admitting: Emergency Medicine

## 2023-08-27 DIAGNOSIS — H6991 Unspecified Eustachian tube disorder, right ear: Secondary | ICD-10-CM

## 2023-08-27 DIAGNOSIS — H9191 Unspecified hearing loss, right ear: Secondary | ICD-10-CM | POA: Diagnosis not present

## 2023-08-27 DIAGNOSIS — C9000 Multiple myeloma not having achieved remission: Secondary | ICD-10-CM

## 2023-08-27 DIAGNOSIS — E1142 Type 2 diabetes mellitus with diabetic polyneuropathy: Secondary | ICD-10-CM

## 2023-08-27 MED ORDER — CETIRIZINE HCL 5 MG PO TABS
5.0000 mg | ORAL_TABLET | Freq: Every evening | ORAL | 1 refills | Status: DC
  Filled 2023-08-27: qty 30, 30d supply, fill #0
  Filled 2023-09-27: qty 30, 30d supply, fill #1

## 2023-08-27 MED ORDER — FLUTICASONE PROPIONATE 50 MCG/ACT NA SUSP
2.0000 | Freq: Every day | NASAL | 2 refills | Status: AC
  Filled 2023-08-27: qty 16, 30d supply, fill #0

## 2023-08-27 MED ORDER — ACCU-CHEK GUIDE TEST VI STRP
ORAL_STRIP | 2 refills | Status: DC
  Filled 2023-08-27: qty 100, 30d supply, fill #0
  Filled 2023-09-17 – 2023-09-19 (×3): qty 100, 30d supply, fill #1
  Filled 2023-10-18: qty 100, 30d supply, fill #2

## 2023-08-27 NOTE — ED Provider Notes (Signed)
[facility]    CSN: [id number] Arrival date & time: 08/27/23  0801     History   Chief Complaint Chief Complaint  Patient presents with   Ear Fullness   Cough   Sore Throat    HPI Robert Sparks is a 78 y.o. male.  Here with 1 week history of right side decreased hearing He denies any pain or pressure  Also reports scratchy throat and occasional cough on and off for about a week.  States these are not bothering him as much just wanted to mention them.  He does not have congestion, runny nose, fever  No interventions yet  Undergoing chemotherapy for multiple myeloma in relapse  Past Medical History:  Diagnosis Date   Anemia    Arthritis    shoulders,feet    Cataract    per eye dr -has appt 5-11   CKD (chronic kidney disease), stage III (HCC)    Elevated PSA    History of ketoacidosis    03-29-2015    History of sepsis    07-25-2013  non-compliant w/ medication   Hyperlipidemia    Hypertension    Nocturia    Peripheral neuropathy    Prostate cancer (HCC)    Type 2 diabetes mellitus with insulin therapy [facility])     Patient Active Problem List   Diagnosis Date Noted   Drug-induced polyneuropathy (HCC) 09/07/2022   Pain in right shoulder 01/06/2022   Port-A-Cath in place 11/02/2021   Macroalbuminuric diabetic nephropathy (HCC) 08/31/2021   Multiple myeloma in relapse (HCC) 06/24/2021   Counseling regarding advance care planning and goals of care 06/24/2021   Memory changes 04/25/2019   Hyperlipidemia associated with type 2 diabetes mellitus (HCC) 04/25/2019   Chronic bilateral low back pain without sciatica 03/14/2019   Spinal stenosis, lumbar region with neurogenic claudication 03/14/2019   History of prostate cancer 12/20/2018   Neuropathy of right hand 12/20/2018   Primary osteoarthritis of right knee 01/07/2018   Diabetic polyneuropathy associated with type 2 diabetes mellitus (HCC) 01/07/2018   Vitamin B12 deficiency 09/04/2017   Cataract  of both eyes 04/12/2017   Tobacco dependence 04/12/2017   Multiple myeloma in remission (HCC) 02/23/2017   Hyperlipidemia 06/21/2016   CKD (chronic kidney disease) stage 3, GFR 30-59 ml/min (HCC) 02/23/2016   Malignant neoplasm of prostate (HCC) 02/21/2016   Controlled type 2 diabetes mellitus with stage 3 chronic kidney disease, with long-term current use of insulin (HCC) 03/29/2015   HTN (hypertension) 07/28/2013   Anemia, chronic disease 07/26/2013   ETOH abuse 07/25/2013    Past Surgical History:  Procedure Laterality Date   CIRCUMCISION  1990's   PROSTATE BIOPSY N/A 12/21/2015   Procedure: BIOPSY TRANSRECTAL ULTRASONIC PROSTATE (TUBP);  Surgeon: Jerilee Field, MD;  Location: [facility];  Service: Urology;  Laterality: N/A;       Home Medications    Prior to Admission medications   Medication Sig Start Date End Date Taking? Authorizing Provider  Accu-Chek Softclix Lancets lancets Use to test blood sugar 3 times daily. 09/14/22  Yes Marcine Matar, MD  amLODipine (NORVASC) 10 MG tablet Take 1 tablet (10 mg total) by mouth daily. 01/09/23  Yes Marcine Matar, MD  aspirin EC 81 MG tablet Take 81 mg by mouth daily. Swallow whole.   Yes [provider]  b complex vitamins capsule Take 1 capsule by mouth daily. 07/16/18  Yes Johney Maine, MD  Blood Glucose Monitoring Suppl (ACCU-CHEK GUIDE) w/Device  KIT Use to test blood sugar three times daily 09/14/22  Yes Marcine Matar, MD  buPROPion [facility]) 75 MG tablet Take 1 tablet (75 mg total) by mouth 2 (two) times daily. To reduce urge of smoking. 07/20/23  Yes Lovorn, Aundra Millet, MD  cetirizine (ZYRTEC) 5 MG tablet Take 1 tablet (5 mg total) by mouth at bedtime. 08/27/23  Yes Maeby Vankleeck, PA-C  Continuous Blood Gluc Receiver (FREESTYLE LIBRE READER) DEVI Use to check blood sugar 3x daily. Z61.09 01/02/22  Yes Marcine Matar, MD  Continuous Blood Gluc Sensor (FREESTYLE LIBRE SENSOR SYSTEM) MISC  Use to check blood sugar 3 times daily. Change sensor every 2 weeks. 01/02/22  Yes Marcine Matar, MD  fluticasone (FLONASE) 50 MCG/ACT nasal spray Place 2 sprays into both nostrils daily. 08/27/23  Yes Xylina Rhoads, Lurena Joiner, PA-C  gabapentin (NEURONTIN) 300 MG capsule Take 1 capsule (300 mg total) by mouth every morning AND 1 capsule (300 mg total) daily at 12 noon AND 2 capsules (600 mg total) at bedtime. Stop Lyrica. 01/09/23  Yes Marcine Matar, MD  Insulin Lispro Prot & Lispro (HUMALOG MIX 75/25 KWIKPEN) (75-25) 100 UNIT/ML Kwikpen Inject 9 units subcutaneously with breakfast and dinner. Increase to 12 units for 3-5 days twice a month after dexamethasone/decadron. 05/11/23  Yes Marcine Matar, MD  Insulin Pen Needle (INSUPEN PEN NEEDLES) 32G X 4 MM MISC USE AS DIRECTED TWICE DAILY WITH INSULIN 05/29/23  Yes Marcine Matar, MD  Multiple Vitamin (MULTIVITAMIN WITH MINERALS) TABS tablet Take 1 tablet by mouth daily. 11/12/16  Yes Arnetha Courser, MD  ondansetron (ZOFRAN) 8 MG tablet Take 1 tablet (8 mg total) by mouth 2 (two) times daily as needed (Nausea or vomiting). 06/24/21  Yes Johney Maine, MD  pravastatin (PRAVACHOL) 20 MG tablet Take 1 tablet (20 mg total) by mouth daily. 01/09/23  Yes Marcine Matar, MD  prochlorperazine (COMPAZINE) 10 MG tablet Take 1 tablet (10 mg total) by mouth every 6 (six) hours as needed (Nausea or vomiting). 06/24/21  Yes Johney Maine, MD  acetaminophen (TYLENOL) 500 MG tablet Take 2 tablets (1,000 mg total) by mouth every 6 (six) hours as needed. 12/02/22   Carlisle Beers, FNP  donepezil (ARICEPT) 5 MG tablet TAKE 1 TABLET (5 MG TOTAL) BY MOUTH AT BEDTIME. FOR MEMORY ISSUES 08/29/21 06/20/23  Marcine Matar, MD  furosemide (LASIX) 20 MG tablet Take 1 tablet (20 mg total) by mouth daily in the morning for 7 days (until 09/08/22). Save remaining tablets. 08/31/22   Dagoberto Ligas, MD  pomalidomide (POMALYST) 2 MG capsule Take 1 capsule (2 mg  total) by mouth daily. 3 weeks on 1 week off 08/14/23   Johney Maine, MD  potassium chloride SA (KLOR-CON M) 20 MEQ tablet Take 1 tablet (20 mEq total) by mouth daily. 02/28/23   Johney Maine, MD  tiZANidine (ZANAFLEX) 4 MG tablet Take 1 tablet (4 mg total) by mouth every 8 (eight) hours as needed for muscle spasms. 07/20/23   Lovorn, Aundra Millet, MD    Family History Family History  Problem Relation Age of Onset   Kidney disease Mother    Cancer Neg Hx    Colon cancer Neg Hx    Colon polyps Neg Hx    Esophageal cancer Neg Hx    Rectal cancer Neg Hx    Stomach cancer Neg Hx     Social History Social History   Tobacco Use   Smoking status: Every Day  Current packs/day: 0.33    Average packs/day: 0.3 packs/day for 50.0 years (16.5 ttl pk-yrs)    Types: Cigarettes   Smokeless tobacco: Never   Tobacco comments:    1 ppwk-4-5 a day   Vaping Use   Vaping status: Never Used  Substance Use Topics   Alcohol use: Yes    Alcohol/week: 1.0 standard drink of alcohol    Types: 1 Cans of beer per week    Comment: a beer every now and again   Drug use: No     Allergies   Other and Lisinopril   Review of Systems Review of Systems Per HPI  Physical Exam Triage Vital Signs ED Triage Vitals [08/27/23 0828]  Encounter Vitals Group     BP 122/75     Systolic BP Percentile      Diastolic BP Percentile      Pulse Rate 74     Resp 18     Temp 98.3 F (36.8 C)     Temp Source Oral     SpO2 95 %     Weight      Height      Head Circumference      Peak Flow      Pain Score      Pain Loc      Pain Education      Exclude from Growth Chart    No data found.  Updated Vital Signs BP 122/75 (BP Location: Left Arm)   Pulse 74   Temp 98.3 F (36.8 C) (Oral)   Resp 18   SpO2 95%   Physical Exam Vitals and nursing note reviewed.  Constitutional:      General: He is not in acute distress.    Appearance: He is not ill-appearing.  HENT:     Right Ear: Tympanic  membrane and ear canal normal.     Left Ear: Tympanic membrane and ear canal normal.     Ears:     Comments: Normal ear exam bilat    Nose: No congestion or rhinorrhea.     Mouth/Throat:     Mouth: Mucous membranes are moist.     Pharynx: Oropharynx is clear. No oropharyngeal exudate or posterior oropharyngeal erythema.  Eyes:     Conjunctiva/sclera: Conjunctivae normal.  Cardiovascular:     Rate and Rhythm: Normal rate and regular rhythm.     Heart sounds: Normal heart sounds.  Pulmonary:     Effort: Pulmonary effort is normal.     Breath sounds: Normal breath sounds. No wheezing or rales.  Musculoskeletal:     Cervical back: Normal range of motion.  Lymphadenopathy:     Cervical: No cervical adenopathy.  Skin:    General: Skin is warm and dry.  Neurological:     Mental Status: He is alert and oriented to person, place, and time.     UC Treatments / Results  Labs (all labs ordered are listed, but only abnormal results are displayed) Labs Reviewed - No data to display  EKG  Radiology No results found.  Procedures Procedures (including critical care time)  Medications Ordered in UC Medications - No data to display  Initial Impression / Assessment and Plan / UC Course  I have reviewed the triage vital signs and the nursing notes.  Pertinent labs & imaging results that were available during my care of the patient were reviewed by me and considered in my medical decision making (see chart for details).  Stable vitals, well appearing,  clear lungs Normal ear exam Concerns for decreased hearing but not having pain/pressure Can try flonase daily with 5 mg zyrtec nightly if there is eustachian tube dysfunction component. Advised contact PCP for follow up, can also follow with audiology. Patient agrees to plan, no questions at this time  Final Clinical Impressions(s) / UC Diagnoses   Final diagnoses:  Change in hearing, right  Eustachian tube dysfunction, right      Discharge Instructions      Flonase -- nasal spray, two sprays in each nostril daily You can get this over the counter but I have also sent as a prescription  Zyrtec -- one 5 mg tablet nightly  Please call your primary care provider to make an appointment for follow up You can also contact the audiologist (hearing specialist)     ED Prescriptions     Medication Sig Dispense Auth. Provider   fluticasone (FLONASE) 50 MCG/ACT nasal spray Place 2 sprays into both nostrils daily. 16 g Graysin Luczynski, PA-C   cetirizine (ZYRTEC) 5 MG tablet Take 1 tablet (5 mg total) by mouth at bedtime. 30 tablet Tylee Newby, Lurena Joiner, PA-C      PDMP not reviewed this encounter.   Marlow Baars, [place] 08/27/23 [phone]

## 2023-08-27 NOTE — ED Triage Notes (Signed)
Patient presents to the office for cough, right ear fullness and sore throat x 1 week.   Home Intervention: None

## 2023-08-27 NOTE — Discharge Instructions (Addendum)
Flonase -- nasal spray, two sprays in each nostril daily You can get this over the counter but I have also sent as a prescription  Zyrtec -- one 5 mg tablet nightly  Please call your primary care provider to make an appointment for follow up You can also contact the audiologist (hearing specialist)

## 2023-08-29 ENCOUNTER — Other Ambulatory Visit: Payer: Self-pay

## 2023-08-30 ENCOUNTER — Inpatient Hospital Stay: Payer: 59

## 2023-08-30 ENCOUNTER — Other Ambulatory Visit: Payer: Self-pay

## 2023-09-13 ENCOUNTER — Other Ambulatory Visit: Payer: Self-pay

## 2023-09-14 ENCOUNTER — Ambulatory Visit: Payer: 59 | Attending: Internal Medicine | Admitting: Internal Medicine

## 2023-09-14 ENCOUNTER — Encounter: Payer: Self-pay | Admitting: Internal Medicine

## 2023-09-14 VITALS — BP 122/69 | HR 65 | Temp 97.3°F | Ht 66.0 in | Wt 167.0 lb

## 2023-09-14 DIAGNOSIS — C9002 Multiple myeloma in relapse: Secondary | ICD-10-CM

## 2023-09-14 DIAGNOSIS — E785 Hyperlipidemia, unspecified: Secondary | ICD-10-CM | POA: Diagnosis not present

## 2023-09-14 DIAGNOSIS — E1159 Type 2 diabetes mellitus with other circulatory complications: Secondary | ICD-10-CM

## 2023-09-14 DIAGNOSIS — I10 Essential (primary) hypertension: Secondary | ICD-10-CM

## 2023-09-14 DIAGNOSIS — N1831 Chronic kidney disease, stage 3a: Secondary | ICD-10-CM

## 2023-09-14 DIAGNOSIS — E1121 Type 2 diabetes mellitus with diabetic nephropathy: Secondary | ICD-10-CM | POA: Diagnosis not present

## 2023-09-14 DIAGNOSIS — Z794 Long term (current) use of insulin: Secondary | ICD-10-CM | POA: Diagnosis not present

## 2023-09-14 DIAGNOSIS — E1169 Type 2 diabetes mellitus with other specified complication: Secondary | ICD-10-CM

## 2023-09-14 DIAGNOSIS — F17200 Nicotine dependence, unspecified, uncomplicated: Secondary | ICD-10-CM

## 2023-09-14 DIAGNOSIS — F17210 Nicotine dependence, cigarettes, uncomplicated: Secondary | ICD-10-CM

## 2023-09-14 DIAGNOSIS — I152 Hypertension secondary to endocrine disorders: Secondary | ICD-10-CM

## 2023-09-14 DIAGNOSIS — E1142 Type 2 diabetes mellitus with diabetic polyneuropathy: Secondary | ICD-10-CM | POA: Diagnosis not present

## 2023-09-14 LAB — POCT GLYCOSYLATED HEMOGLOBIN (HGB A1C): HbA1c, POC (controlled diabetic range): 6.8 % (ref 0.0–7.0)

## 2023-09-14 LAB — GLUCOSE, POCT (MANUAL RESULT ENTRY): POC Glucose: 172 mg/dL — AB (ref 70–99)

## 2023-09-14 NOTE — Progress Notes (Signed)
 Patient ID: Robert Sparks, male    DOB: 13-Jul-1945  MRN: [id number]  CC: Diabetes (DM f/u. /No questions / concerns/Already received flu vax)   Subjective: Robert Sparks is a 78 y.o. male who presents for chronic ds management. His concerns today include:  Patient with history of HTN, DM 2 with polyneuropathy, HL, CKD stage 3, tob dependence, EtOH abuse, prostate CA, OA knee, vit B 12 def, spinal stenosis, and multiple myeloma, memory changes MMSE 25/30 2023), CTS RT hand/EMG 12/2018.     DM: Results for orders placed or performed in visit on 09/14/23  POCT glucose (manual entry)   Collection Time: 09/14/23 10:56 AM  Result Value Ref Range   POC Glucose 172 (A) 70 - 99 mg/dl  POCT glycosylated hemoglobin (Hb A1C)   Collection Time: 09/14/23 11:06 AM  Result Value Ref Range   Hemoglobin A1C     HbA1c POC (<> result, manual entry)     HbA1c, POC (prediabetic range)     HbA1c, POC (controlled diabetic range) 6.8 0.0 - 7.0 %   *Note: Due to a large number of results and/or encounters for the requested time period, some results have not been displayed. A complete set of results can be found in Results Review.  A1C has improved from 7.5 Confirms taking Humalog mix to 9 units twice a day and 12 units for 3 to 5 days twice a month after dose of Decadron.  Checks BS 3-4x/day.  Does not have log or glucometer.  Gives reading 119-145.  Occasionally lows in 70s.  He can feel when BS low Reports having good appetite Has appt with [facility] 12/2023  HTN/CKD: Continue amlodipine 10 mg daily and Diovan 40 mg daily.  Checks BP once a day; does not have log.  Reports range SBP 130-140 Limits salt as much as he can Last GFR 40s-50. Last reading was 49.  Last saw nephrology last yr.  Still followed by PMR Dr. Berline Chough.  Still using the lido patches to the back which helps some.  He is afraid of pursuing back surgery.  Tob dep: still at 1/3 pk a day.  Prescribed Buproprion to help with  smoking cessation by Dr. Berline Chough.  Finds it helpful in decreasing his cravings for cigarettes but not taking consistently.  MM:  still being treated by onc Dr. Candise Che  Patient Active Problem List   Diagnosis Date Noted   Drug-induced polyneuropathy (HCC) 09/07/2022   Pain in right shoulder 01/06/2022   Port-A-Cath in place 11/02/2021   Macroalbuminuric diabetic nephropathy (HCC) 08/31/2021   Multiple myeloma in relapse (HCC) 06/24/2021   Counseling regarding advance care planning and goals of care 06/24/2021   Memory changes 04/25/2019   Hyperlipidemia associated with type 2 diabetes mellitus (HCC) 04/25/2019   Chronic bilateral low back pain without sciatica 03/14/2019   Spinal stenosis, lumbar region with neurogenic claudication 03/14/2019   History of prostate cancer 12/20/2018   Neuropathy of right hand 12/20/2018   Primary osteoarthritis of right knee 01/07/2018   Diabetic polyneuropathy associated with type 2 diabetes mellitus (HCC) 01/07/2018   Vitamin B12 deficiency 09/04/2017   Cataract of both eyes 04/12/2017   Tobacco dependence 04/12/2017   Multiple myeloma in remission (HCC) 02/23/2017   Hyperlipidemia 06/21/2016   CKD (chronic kidney disease) stage 3, GFR 30-59 ml/min (HCC) 02/23/2016   Malignant neoplasm of prostate (HCC) 02/21/2016   Controlled type 2 diabetes mellitus with stage 3 chronic kidney disease, with long-term current use  of insulin (HCC) 03/29/2015   HTN (hypertension) 07/28/2013   Anemia, chronic disease 07/26/2013   ETOH abuse 07/25/2013     Current Outpatient Medications on File Prior to Visit  Medication Sig Dispense Refill   Accu-Chek Softclix Lancets lancets Use to test blood sugar 3 times daily. 100 each 3   acetaminophen (TYLENOL) 500 MG tablet Take 2 tablets (1,000 mg total) by mouth every 6 (six) hours as needed. 30 tablet 0   amLODipine (NORVASC) 10 MG tablet Take 1 tablet (10 mg total) by mouth daily. 90 tablet 1   aspirin EC 81 MG tablet Take  81 mg by mouth daily. Swallow whole.     b complex vitamins capsule Take 1 capsule by mouth daily. 30 capsule 5   Blood Glucose Monitoring Suppl (ACCU-CHEK GUIDE) w/Device KIT Use to test blood sugar three times daily 1 kit 0   buPROPion (WELLBUTRIN) 75 MG tablet Take 1 tablet (75 mg total) by mouth 2 (two) times daily. To reduce urge of smoking. 60 tablet 3   cetirizine (ZYRTEC) 5 MG tablet Take 1 tablet (5 mg total) by mouth at bedtime. 30 tablet 1   Continuous Blood Gluc Receiver (FREESTYLE LIBRE READER) DEVI Use to check blood sugar 3x daily. E11.42 1 each 0   Continuous Blood Gluc Sensor (FREESTYLE LIBRE SENSOR SYSTEM) MISC Use to check blood sugar 3 times daily. Change sensor every 2 weeks. 2 each 12   fluticasone (FLONASE) 50 MCG/ACT nasal spray Place 2 sprays into both nostrils daily. 16 g 2   furosemide (LASIX) 20 MG tablet Take 1 tablet (20 mg total) by mouth daily in the morning for 7 days (until 09/08/22). Save remaining tablets. 30 tablet 0   gabapentin (NEURONTIN) 300 MG capsule Take 1 capsule (300 mg total) by mouth every morning AND 1 capsule (300 mg total) daily at 12 noon AND 2 capsules (600 mg total) at bedtime. Stop Lyrica. 120 capsule 11   glucose blood (ACCU-CHEK GUIDE TEST) test strip Use to test blood sugar 3 times daily. 100 each 2   Insulin Lispro Prot & Lispro (HUMALOG MIX 75/25 KWIKPEN) (75-25) 100 UNIT/ML Kwikpen Inject 9 units subcutaneously with breakfast and dinner. Increase to 12 units for 3-5 days twice a month after dexamethasone/decadron. 15 mL 11   Insulin Pen Needle (INSUPEN PEN NEEDLES) 32G X 4 MM MISC USE AS DIRECTED TWICE DAILY WITH INSULIN 100 each 1   Multiple Vitamin (MULTIVITAMIN WITH MINERALS) TABS tablet Take 1 tablet by mouth daily. 60 tablet 2   ondansetron (ZOFRAN) 8 MG tablet Take 1 tablet (8 mg total) by mouth 2 (two) times daily as needed (Nausea or vomiting). 30 tablet 1   pomalidomide (POMALYST) 2 MG capsule Take 1 capsule (2 mg total) by mouth  daily. 3 weeks on 1 week off 21 capsule 0   potassium chloride SA (KLOR-CON M) 20 MEQ tablet Take 1 tablet (20 mEq total) by mouth daily. 30 tablet 1   pravastatin (PRAVACHOL) 20 MG tablet Take 1 tablet (20 mg total) by mouth daily. 90 tablet 1   prochlorperazine (COMPAZINE) 10 MG tablet Take 1 tablet (10 mg total) by mouth every 6 (six) hours as needed (Nausea or vomiting). 30 tablet 1   tiZANidine (ZANAFLEX) 4 MG tablet Take 1 tablet (4 mg total) by mouth every 8 (eight) hours as needed for muscle spasms. 90 tablet 5   donepezil (ARICEPT) 5 MG tablet TAKE 1 TABLET (5 MG TOTAL) BY MOUTH AT BEDTIME. FOR MEMORY  ISSUES 30 tablet 5   No current facility-administered medications on file prior to visit.    Allergies  Allergen Reactions   Other Other (See Comments)    Nicoderm CQ = Bad Dreams Nicotine gum = Bad Dreams    Lisinopril Cough    Social History   Socioeconomic History   Marital status: Single    Spouse name: Not on file   Number of children: Not on file   Years of education: Not on file   Highest education level: Not on file  Occupational History   Not on file  Tobacco Use   Smoking status: Every Day    Current packs/day: 0.33    Average packs/day: 0.3 packs/day for 50.0 years (16.5 ttl pk-yrs)    Types: Cigarettes   Smokeless tobacco: Never   Tobacco comments:    1 ppwk-4-5 a day   Vaping Use   Vaping status: Never Used  Substance and Sexual Activity   Alcohol use: Yes    Alcohol/week: 1.0 standard drink of alcohol    Types: 1 Cans of beer per week    Comment: a beer every now and again   Drug use: No   Sexual activity: Not Currently  Other Topics Concern   Not on file  Social History Narrative   Not on file   Social Drivers of Health   Financial Resource Strain: Low Risk  (05/11/2023)   Overall Financial Resource Strain (CARDIA)    Difficulty of Paying Living Expenses: Not hard at all  Food Insecurity: No Food Insecurity (05/11/2023)   Hunger Vital Sign     Worried About Running Out of Food in the Last Year: Never true    Ran Out of Food in the Last Year: Never true  Transportation Needs: No Transportation Needs (05/11/2023)   PRAPARE - Administrator, Civil Service (Medical): No    Lack of Transportation (Non-Medical): No  Physical Activity: Inactive (05/11/2023)   Exercise Vital Sign    Days of Exercise per Week: 0 days    Minutes of Exercise per Session: 0 min  Stress: No Stress Concern Present (05/11/2023)   [employer] of Occupational Health - Occupational Stress Questionnaire    Feeling of Stress : Not at all  Social Connections: Moderately Isolated (05/11/2023)   Social Connection and Isolation Panel [NHANES]    Frequency of Communication with Friends and Family: Three times a week    Frequency of Social Gatherings with Friends and Family: Twice a week    Attends Religious Services: Never    Database administrator or Organizations: Yes    Attends Engineer, structural: More than 4 times per year    Marital Status: Never married  Intimate Partner Violence: Not At Risk (05/11/2023)   Humiliation, Afraid, Rape, and Kick questionnaire    Fear of Current or Ex-Partner: No    Emotionally Abused: No    Physically Abused: No    Sexually Abused: No    Family History  Problem Relation Age of Onset   Kidney disease Mother    Cancer Neg Hx    Colon cancer Neg Hx    Colon polyps Neg Hx    Esophageal cancer Neg Hx    Rectal cancer Neg Hx    Stomach cancer Neg Hx     Past Surgical History:  Procedure Laterality Date   CIRCUMCISION  1990's   PROSTATE BIOPSY N/A 12/21/2015   Procedure: BIOPSY TRANSRECTAL ULTRASONIC PROSTATE (TUBP);  Surgeon:  Jerilee Field, MD;  Location: [facility];  Service: Urology;  Laterality: N/A;    ROS: Review of Systems Negative except as stated above  PHYSICAL EXAM: BP 122/69 (BP Location: Left Arm, Patient Position: Sitting, Cuff Size: Normal)   Pulse 65   Temp  (!) 97.3 F (36.3 C) (Oral)   Ht 5\' 6"  (1.676 m)   Wt 167 lb (75.8 kg)   SpO2 99%   BMI 26.95 kg/m  Wt Readings from Last 3 Encounters:  09/14/23 167 lb (75.8 kg)  08/01/23 166 lb 4 oz (75.4 kg)  07/20/23 166 lb (75.3 kg)     Physical Exam   General appearance - alert, well appearing, pleasant older African-American male and in no distress Mental status - normal mood, behavior, speech, dress, motor activity, and thought processes Neck - supple, no significant adenopathy Chest - clear to auscultation, no wheezes, rales or rhonchi, symmetric air entry Heart - normal rate, regular rhythm, normal S1, S2, no murmurs, rubs, clicks or gallops Extremities - peripheral pulses normal, no pedal edema, no clubbing or cyanosis     Latest Ref Rng & Units 08/01/2023    7:43 AM 07/18/2023   10:49 AM 07/05/2023    9:48 AM  CMP  Glucose 70 - 99 mg/dL 161  096  045   BUN 8 - 23 mg/dL 14  17  15    Creatinine 0.61 - 1.24 mg/dL 4.09  8.11  9.14   Sodium 135 - 145 mmol/L 138  141  140   Potassium 3.5 - 5.1 mmol/L 3.4  3.9  3.8   Chloride 98 - 111 mmol/L 106  108  108   CO2 22 - 32 mmol/L 26  25  27    Calcium 8.9 - 10.3 mg/dL 9.2  9.5  9.6   Total Protein 6.5 - 8.1 g/dL 6.3  6.2  6.4   Total Bilirubin 0.0 - 1.2 mg/dL 0.7  0.7  0.5   Alkaline Phos 38 - 126 U/L 52  74  58   AST 15 - 41 U/L 15  15  15    ALT 0 - 44 U/L 15  17  20     Lipid Panel     Component Value Date/Time   CHOL 199 09/07/2022 1008   TRIG 116 09/07/2022 1008   HDL 96 09/07/2022 1008   CHOLHDL 2.1 09/07/2022 1008   CHOLHDL 3.6 06/21/2016 1047   VLDL 50 (H) 06/21/2016 1047   LDLCALC 83 09/07/2022 1008    CBC    Component Value Date/Time   WBC 3.7 (L) 08/01/2023 0743   WBC 5.6 07/13/2021 0741   RBC 2.75 (L) 08/01/2023 0743   HGB 9.2 (L) 08/01/2023 0743   HGB 12.2 (L) 12/06/2017 0920   HGB 10.1 (L) 07/13/2017 0938   HCT 27.6 (L) 08/01/2023 0743   HCT 37.1 (L) 12/06/2017 0920   HCT 30.6 (L) 07/13/2017 0938   PLT 238  08/01/2023 0743   PLT 250 12/06/2017 0920   MCV 100.4 (H) 08/01/2023 0743   MCV 98 (H) 12/06/2017 0920   MCV 96.2 07/13/2017 0938   MCH 33.5 08/01/2023 0743   MCHC 33.3 08/01/2023 0743   RDW 15.7 (H) 08/01/2023 0743   RDW 14.1 12/06/2017 0920   RDW 14.4 07/13/2017 0938   LYMPHSABS 1.3 08/01/2023 0743   LYMPHSABS 0.8 (L) 07/13/2017 0938   MONOABS 0.6 08/01/2023 0743   MONOABS 0.4 07/13/2017 0938   EOSABS 0.1 08/01/2023 0743   EOSABS 0.5 07/13/2017  4098   EOSABS 0.8 (H) 11/21/2016 1416   BASOSABS 0.1 08/01/2023 0743   BASOSABS 0.0 07/13/2017 0938    ASSESSMENT AND PLAN: 1. Type 2 diabetes mellitus with peripheral neuropathy (HCC) (Primary) A1c is at goal.  He will continue Humalog 75/25 9 units twice a day with increased to 12 units twice a day for 3 to 5 days after he is given Decadron by his oncologist. - POCT glycosylated hemoglobin (Hb A1C) - POCT glucose (manual entry) - Microalbumin / creatinine urine ratio  2. Insulin long-term use (HCC) See #1 above  3. Hypertension associated with type 2 diabetes mellitus (HCC) At goal.  Continue Norvasc 10 mg daily and Diovan 40 mg daily.  4. Hyperlipidemia associated with type 2 diabetes mellitus (HCC) Continue Pravachol  5. Tobacco dependence Encourage patient to take the bupropion consistently.  It appears however that he is not fully committed to quitting at this time.  6. Multiple myeloma in relapse [facility]) Being managed by oncology  7. Stage 3a chronic kidney disease (HCC) Stable.  Followed by nephrology.    Patient was given the opportunity to ask questions.  Patient verbalized understanding of the plan and was able to repeat key elements of the plan.   This documentation was completed using Paediatric nurse.  Any transcriptional errors are unintentional.  Orders Placed This Encounter  Procedures   POCT glycosylated hemoglobin (Hb A1C)   POCT glucose (manual entry)     Requested Prescriptions     No prescriptions requested or ordered in this encounter    No follow-ups on file.  Jonah Blue, MD, FACP

## 2023-09-15 ENCOUNTER — Other Ambulatory Visit: Payer: Self-pay

## 2023-09-17 ENCOUNTER — Other Ambulatory Visit: Payer: Self-pay

## 2023-09-17 ENCOUNTER — Encounter: Payer: Self-pay | Admitting: Podiatry

## 2023-09-17 ENCOUNTER — Ambulatory Visit (INDEPENDENT_AMBULATORY_CARE_PROVIDER_SITE_OTHER): Payer: 59 | Admitting: Podiatry

## 2023-09-17 DIAGNOSIS — M79675 Pain in left toe(s): Secondary | ICD-10-CM

## 2023-09-17 DIAGNOSIS — L84 Corns and callosities: Secondary | ICD-10-CM | POA: Diagnosis not present

## 2023-09-17 DIAGNOSIS — E1142 Type 2 diabetes mellitus with diabetic polyneuropathy: Secondary | ICD-10-CM | POA: Diagnosis not present

## 2023-09-17 DIAGNOSIS — B351 Tinea unguium: Secondary | ICD-10-CM | POA: Diagnosis not present

## 2023-09-17 DIAGNOSIS — M79674 Pain in right toe(s): Secondary | ICD-10-CM

## 2023-09-17 LAB — MICROALBUMIN / CREATININE URINE RATIO
Creatinine, Urine: 167.8 mg/dL
Microalb/Creat Ratio: 406 mg/g{creat} — ABNORMAL HIGH (ref 0–29)
Microalbumin, Urine: 681.8 ug/mL

## 2023-09-17 NOTE — Progress Notes (Signed)
 This patient presents to the office with chief complaint of long thick painful nails.  Patient says the nails are painful walking and wearing shoes.  This patient is unable to self treat.  This patient is unable to trim his nails since he is unable to reach his nails.  She presents to the office for preventative foot care services.  General Appearance  Alert, conversant and in no acute stress.  Vascular  Dorsalis pedis and posterior tibial  pulses are palpable  bilaterally.  Capillary return is within normal limits  bilaterally. Temperature is within normal limits  bilaterally.  Neurologic  Senn-Weinstein monofilament wire test within normal limits  bilaterally. Muscle power within normal limits bilaterally.  Nails Thick disfigured discolored nails with subungual debris  from hallux to fifth toes bilaterally. No evidence of bacterial infection or drainage bilaterally.  Orthopedic  No limitations of motion  feet .  No crepitus or effusions noted.  No bony pathology or digital deformities noted.  Skin  normotropic skin with no porokeratosis noted bilaterally.  No signs of infections or ulcers noted.   Callus plantar right hallux.  Onychomycosis  Nails  B/L.  Pain in right toes  Pain in left toes  Callus right hallux.  Debridement of nails both feet followed trimming the nails with dremel tool.  Debride callus with # 15 blade and dremel tool.   Patient requests evaluation for permanent removal callus right hallux.  To make an appointment with one of the medical doctors.RTC 10 weeks    Helane Gunther DPM

## 2023-09-19 ENCOUNTER — Other Ambulatory Visit: Payer: Self-pay

## 2023-09-20 NOTE — Progress Notes (Signed)
 Let patient know that he has some protein in the urine but stable amount compared to 1 year ago.  Please clarify for me with the patient whether or not he is taking valsartan 40 mg daily.  This medication was on his med list previously but looks like it was discontinued for unclear reason when he was seen at urgent care on 08/27/2023 and is no longer on his med list.

## 2023-09-21 ENCOUNTER — Other Ambulatory Visit: Payer: Self-pay | Admitting: Internal Medicine

## 2023-09-21 ENCOUNTER — Other Ambulatory Visit: Payer: Self-pay

## 2023-09-21 DIAGNOSIS — I152 Hypertension secondary to endocrine disorders: Secondary | ICD-10-CM

## 2023-09-21 DIAGNOSIS — C9002 Multiple myeloma in relapse: Secondary | ICD-10-CM

## 2023-09-21 MED ORDER — VALSARTAN 40 MG PO TABS
40.0000 mg | ORAL_TABLET | Freq: Every day | ORAL | 3 refills | Status: DC
  Filled 2023-09-21: qty 90, 90d supply, fill #0
  Filled 2024-01-29: qty 90, 90d supply, fill #1
  Filled 2024-04-30: qty 90, 90d supply, fill #2

## 2023-09-21 MED ORDER — POMALIDOMIDE 2 MG PO CAPS: 2.0000 mg | ORAL_CAPSULE | Freq: Every day | ORAL | 0 refills | Status: DC

## 2023-09-22 ENCOUNTER — Other Ambulatory Visit: Payer: Self-pay | Admitting: Internal Medicine

## 2023-09-22 DIAGNOSIS — I152 Hypertension secondary to endocrine disorders: Secondary | ICD-10-CM

## 2023-09-22 MED ORDER — VALSARTAN 40 MG PO TABS: 40.0000 mg | ORAL_TABLET | Freq: Every day | ORAL | Status: DC

## 2023-09-24 ENCOUNTER — Other Ambulatory Visit: Payer: Self-pay

## 2023-09-26 ENCOUNTER — Inpatient Hospital Stay: Payer: 59 | Attending: Hematology

## 2023-09-26 ENCOUNTER — Inpatient Hospital Stay: Payer: 59

## 2023-09-26 ENCOUNTER — Inpatient Hospital Stay (HOSPITAL_BASED_OUTPATIENT_CLINIC_OR_DEPARTMENT_OTHER): Payer: 59 | Admitting: Hematology

## 2023-09-26 VITALS — BP 127/63 | HR 75 | Temp 98.1°F | Resp 18 | Ht 66.0 in | Wt 167.5 lb

## 2023-09-26 DIAGNOSIS — D721 Eosinophilia, unspecified: Secondary | ICD-10-CM | POA: Insufficient documentation

## 2023-09-26 DIAGNOSIS — C9002 Multiple myeloma in relapse: Secondary | ICD-10-CM

## 2023-09-26 DIAGNOSIS — Z7189 Other specified counseling: Secondary | ICD-10-CM

## 2023-09-26 DIAGNOSIS — Z5111 Encounter for antineoplastic chemotherapy: Secondary | ICD-10-CM | POA: Diagnosis not present

## 2023-09-26 DIAGNOSIS — N183 Chronic kidney disease, stage 3 unspecified: Secondary | ICD-10-CM | POA: Diagnosis not present

## 2023-09-26 DIAGNOSIS — R519 Headache, unspecified: Secondary | ICD-10-CM | POA: Insufficient documentation

## 2023-09-26 DIAGNOSIS — Z5112 Encounter for antineoplastic immunotherapy: Secondary | ICD-10-CM | POA: Insufficient documentation

## 2023-09-26 DIAGNOSIS — I129 Hypertensive chronic kidney disease with stage 1 through stage 4 chronic kidney disease, or unspecified chronic kidney disease: Secondary | ICD-10-CM | POA: Diagnosis not present

## 2023-09-26 DIAGNOSIS — E1142 Type 2 diabetes mellitus with diabetic polyneuropathy: Secondary | ICD-10-CM | POA: Insufficient documentation

## 2023-09-26 DIAGNOSIS — E1122 Type 2 diabetes mellitus with diabetic chronic kidney disease: Secondary | ICD-10-CM | POA: Diagnosis not present

## 2023-09-26 DIAGNOSIS — R61 Generalized hyperhidrosis: Secondary | ICD-10-CM | POA: Insufficient documentation

## 2023-09-26 DIAGNOSIS — D649 Anemia, unspecified: Secondary | ICD-10-CM | POA: Diagnosis not present

## 2023-09-26 LAB — CBC WITH DIFFERENTIAL (CANCER CENTER ONLY)
Abs Immature Granulocytes: 0.01 10*3/uL (ref 0.00–0.07)
Basophils Absolute: 0.1 10*3/uL (ref 0.0–0.1)
Basophils Relative: 1 %
Eosinophils Absolute: 0.2 10*3/uL (ref 0.0–0.5)
Eosinophils Relative: 4 %
HCT: 28.9 % — ABNORMAL LOW (ref 39.0–52.0)
Hemoglobin: 9.7 g/dL — ABNORMAL LOW (ref 13.0–17.0)
Immature Granulocytes: 0 %
Lymphocytes Relative: 26 %
Lymphs Abs: 1.4 10*3/uL (ref 0.7–4.0)
MCH: 33.2 pg (ref 26.0–34.0)
MCHC: 33.6 g/dL (ref 30.0–36.0)
MCV: 99 fL (ref 80.0–100.0)
Monocytes Absolute: 1 10*3/uL (ref 0.1–1.0)
Monocytes Relative: 18 %
Neutro Abs: 2.7 10*3/uL (ref 1.7–7.7)
Neutrophils Relative %: 51 %
Platelet Count: 195 10*3/uL (ref 150–400)
RBC: 2.92 MIL/uL — ABNORMAL LOW (ref 4.22–5.81)
RDW: 14.1 % (ref 11.5–15.5)
WBC Count: 5.3 10*3/uL (ref 4.0–10.5)
nRBC: 0 % (ref 0.0–0.2)

## 2023-09-26 LAB — CMP (CANCER CENTER ONLY)
ALT: 10 U/L (ref 0–44)
AST: 11 U/L — ABNORMAL LOW (ref 15–41)
Albumin: 3.7 g/dL (ref 3.5–5.0)
Alkaline Phosphatase: 51 U/L (ref 38–126)
Anion gap: 6 (ref 5–15)
BUN: 14 mg/dL (ref 8–23)
CO2: 25 mmol/L (ref 22–32)
Calcium: 8.9 mg/dL (ref 8.9–10.3)
Chloride: 106 mmol/L (ref 98–111)
Creatinine: 1.58 mg/dL — ABNORMAL HIGH (ref 0.61–1.24)
GFR, Estimated: 45 mL/min — ABNORMAL LOW (ref >60)
Glucose, Bld: 164 mg/dL — ABNORMAL HIGH (ref 70–99)
Potassium: 4.2 mmol/L (ref 3.5–5.1)
Sodium: 137 mmol/L (ref 135–145)
Total Bilirubin: 0.5 mg/dL (ref 0.0–1.2)
Total Protein: 6 g/dL — ABNORMAL LOW (ref 6.5–8.1)

## 2023-09-26 MED ORDER — DIPHENHYDRAMINE HCL 25 MG PO CAPS
50.0000 mg | ORAL_CAPSULE | Freq: Once | ORAL | Status: AC
  Administered 2023-09-26: 50 mg via ORAL
  Filled 2023-09-26: qty 2

## 2023-09-26 MED ORDER — DARATUMUMAB-HYALURONIDASE-FIHJ 1800-30000 MG-UT/15ML ~~LOC~~ SOLN
1800.0000 mg | Freq: Once | SUBCUTANEOUS | Status: AC
  Administered 2023-09-26: 1800 mg via SUBCUTANEOUS
  Filled 2023-09-26: qty 15

## 2023-09-26 MED ORDER — FAMOTIDINE 20 MG PO TABS
20.0000 mg | ORAL_TABLET | Freq: Once | ORAL | Status: AC
  Administered 2023-09-26: 20 mg via ORAL
  Filled 2023-09-26: qty 1

## 2023-09-26 MED ORDER — ACETAMINOPHEN 325 MG PO TABS
650.0000 mg | ORAL_TABLET | Freq: Once | ORAL | Status: AC
  Administered 2023-09-26: 650 mg via ORAL
  Filled 2023-09-26: qty 2

## 2023-09-26 MED ORDER — DEXAMETHASONE 4 MG PO TABS
20.0000 mg | ORAL_TABLET | Freq: Once | ORAL | Status: AC
Start: 2023-09-26 — End: 2023-09-26
  Administered 2023-09-26: 20 mg via ORAL
  Filled 2023-09-26: qty 5

## 2023-09-26 NOTE — Patient Instructions (Signed)

## 2023-09-26 NOTE — Progress Notes (Signed)
 Patient seen by Dr. Addison Naegeli are within treatment parameters.  Labs reviewed: and are not all within treatment parameters.    Dr Candise Che aware CR: 1.58  Per physician team, patient is ready for treatment and there are NO modifications to the treatment plan.

## 2023-09-26 NOTE — Progress Notes (Signed)
 HEMATOLOGY/ONCOLOGY CLINIC NOTE  Date of Service: 09/26/23   Patient Care Team: Marcine Matar, MD as PCP - General (Internal Medicine)  CHIEF COMPLAINTS/PURPOSE OF CONSULTATION:  Follow-up for continued evaluation and management of relapsed multiple myeloma  Oncologic History:  78 y.o. male with diagnosis of IgA lambda active multiple myeloma, ISS Stage I. Active disease diagnosed based on presence of anemia, kidney insufficiency, and paraproteinemia with significant predominance of lambda light chains as well as significant elevation of IgA. Bone marrow biopsy confirmed presence of atypical monoclonal plasma cell process in the bone marrow comprising at least 7% of the cellularity. Based on the findings, patient was started on treatment with lenalidomide, bortezomib, and low-dose dexamethasone based on the anticipated tolerance by the patient. Lenalidomide was started at 10 mg daily based on creatinine clearance of 42, dexamethasone dose was reduced to 20 mg weekly based on patient's age. Treatment was complicated by rapid development of cutaneous rash attributed to lenalidomide, inaddition to decreased renal function and now patient has persistent severe anemia. Side-effects were attributed to Lenalidomide and lenalidomide was discontinued with subsequent recovery.  Patient has been receiving bortezomib weekly with low-dose dexamethasone in 4-day cycles.   Patient has completed induction systemic therapy for his disease with repeat bone marrow biopsy confirming complete response including no evidence of minimal residual disease by cytogenetics or FISH.  HISTORY OF PRESENTING ILLNESS:  Please see previous note for details of initial presentation.  [name] is a 78 y.o. male is here for continued evaluation and management of his relapsed multiple myeloma. Patient was last seen by me on 07/18/2023 and complained of dizziness, back spasms, and diarrhea  sometimes.  Today, he complains of mild head pain on the right side above the eye.Patient reports that his headache began 3-4 days ago. He denies any rash in the area. He reports mild tenderness near the area on palpation. Patient has no pain when looking upwards.  Patient notes being evaluated by a doctor over the weekend and given medication for allergies though he did not inform them of head pain symptoms.  Patient was last seen by an eye doctor 1 year ago. He has cataract issues. He denies any concern for increased pressure in the eye.  He reports that he plans to have cataract surgery. Patient denies any injuries to the head.  He endorses night sweats sometimes. Patient denies any fever, chills, abdominal pain, or infection issues.He has normal p.o. intake and energy levels.   Patient reports back pain which limits his activity level.   Patient reports endorsing mild diarrhea and constipation sometimes.  He has been tolerating daratumumab and pomalidomide well with no new or major toxicity issues.   MEDICAL HISTORY:  Past Medical History:  Diagnosis Date   Anemia    Arthritis    shoulders,feet    Cataract    per eye dr -has appt 5-11   CKD (chronic kidney disease), stage III (HCC)    Elevated PSA    History of ketoacidosis    03-29-2015    History of sepsis    07-25-2013  non-compliant w/ medication   Hyperlipidemia    Hypertension    Nocturia    Peripheral neuropathy    Prostate cancer (HCC)    Type 2 diabetes mellitus with insulin therapy [facility])     SURGICAL HISTORY: Past Surgical History:  Procedure Laterality Date   CIRCUMCISION  1990's   PROSTATE BIOPSY N/A 12/21/2015  Procedure: BIOPSY TRANSRECTAL ULTRASONIC PROSTATE (TUBP);  Surgeon: Jerilee Field, MD;  [facility]: [facility];  Service: Urology;  Laterality: N/A;    SOCIAL HISTORY: Social History   Socioeconomic History   Marital status: Single    Spouse name: Not on file   Number of  children: Not on file   Years of education: Not on file   Highest education level: Not on file  Occupational History   Not on file  Tobacco Use   Smoking status: Every Day    Current packs/day: 0.33    Average packs/day: 0.3 packs/day for 50.0 years (16.5 ttl pk-yrs)    Types: Cigarettes   Smokeless tobacco: Never   Tobacco comments:    1 ppwk-4-5 a day   Vaping Use   Vaping status: Never Used  Substance and Sexual Activity   Alcohol use: Yes    Alcohol/week: 1.0 standard drink of alcohol    Types: 1 Cans of beer per week    Comment: a beer every now and again   Drug use: No   Sexual activity: Not Currently  Other Topics Concern   Not on file  Social History Narrative   Not on file   Social Drivers of Health   Financial Resource Strain: Low Risk  (05/11/2023)   Overall Financial Resource Strain (CARDIA)    Difficulty of Paying Living Expenses: Not hard at all  Food Insecurity: No Food Insecurity (05/11/2023)   Hunger Vital Sign    Worried About Running Out of Food in the Last Year: Never true    Ran Out of Food in the Last Year: Never true  Transportation Needs: No Transportation Needs (05/11/2023)   PRAPARE - Administrator, Civil Service (Medical): No    Lack of Transportation (Non-Medical): No  Physical Activity: Inactive (05/11/2023)   Exercise Vital Sign    Days of Exercise per Week: 0 days    Minutes of Exercise per Session: 0 min  Stress: No Stress Concern Present (05/11/2023)   [employer] of Occupational Health - Occupational Stress Questionnaire    Feeling of Stress : Not at all  Social Connections: Moderately Isolated (05/11/2023)   Social Connection and Isolation Panel [NHANES]    Frequency of Communication with Friends and Family: Three times a week    Frequency of Social Gatherings with Friends and Family: Twice a week    Attends Religious Services: Never    Database administrator or Organizations: Yes    Attends Hospital doctor: More than 4 times per year    Marital Status: Never married  Intimate Partner Violence: Not At Risk (05/11/2023)   Humiliation, Afraid, Rape, and Kick questionnaire    Fear of Current or Ex-Partner: No    Emotionally Abused: No    Physically Abused: No    Sexually Abused: No    FAMILY HISTORY: Family History  Problem Relation Age of Onset   Kidney disease Mother    Cancer Neg Hx    Colon cancer Neg Hx    Colon polyps Neg Hx    Esophageal cancer Neg Hx    Rectal cancer Neg Hx    Stomach cancer Neg Hx     ALLERGIES:  is allergic to other and lisinopril.  MEDICATIONS:  Current Outpatient Medications  Medication Sig Dispense Refill   Accu-Chek Softclix Lancets lancets Use to test blood sugar 3 times daily. 100 each 3   acetaminophen (TYLENOL) 500 MG tablet Take 2 tablets (  1,000 mg total) by mouth every 6 (six) hours as needed. 30 tablet 0   amLODipine (NORVASC) 10 MG tablet Take 1 tablet (10 mg total) by mouth daily. 90 tablet 1   aspirin EC 81 MG tablet Take 81 mg by mouth daily. Swallow whole.     b complex vitamins capsule Take 1 capsule by mouth daily. 30 capsule 5   Blood Glucose Monitoring Suppl (ACCU-CHEK GUIDE) w/Device KIT Use to test blood sugar three times daily 1 kit 0   buPROPion (WELLBUTRIN) 75 MG tablet Take 1 tablet (75 mg total) by mouth 2 (two) times daily. To reduce urge of smoking. 60 tablet 3   cetirizine (ZYRTEC) 5 MG tablet Take 1 tablet (5 mg total) by mouth at bedtime. 30 tablet 1   Continuous Blood Gluc Receiver (FREESTYLE LIBRE READER) DEVI Use to check blood sugar 3x daily. E11.42 1 each 0   Continuous Blood Gluc Sensor (FREESTYLE LIBRE SENSOR SYSTEM) MISC Use to check blood sugar 3 times daily. Change sensor every 2 weeks. 2 each 12   donepezil (ARICEPT) 5 MG tablet TAKE 1 TABLET (5 MG TOTAL) BY MOUTH AT BEDTIME. FOR MEMORY ISSUES 30 tablet 5   fluticasone (FLONASE) 50 MCG/ACT nasal spray Place 2 sprays into both nostrils daily. 16 g 2    furosemide (LASIX) 20 MG tablet Take 1 tablet (20 mg total) by mouth daily in the morning for 7 days (until 09/08/22). Save remaining tablets. 30 tablet 0   gabapentin (NEURONTIN) 300 MG capsule Take 1 capsule (300 mg total) by mouth every morning AND 1 capsule (300 mg total) daily at 12 noon AND 2 capsules (600 mg total) at bedtime. Stop Lyrica. 120 capsule 11   glucose blood (ACCU-CHEK GUIDE TEST) test strip Use to test blood sugar 3 times daily. 100 each 2   Insulin Lispro Prot & Lispro (HUMALOG MIX 75/25 KWIKPEN) (75-25) 100 UNIT/ML Kwikpen Inject 9 units subcutaneously with breakfast and dinner. Increase to 12 units for 3-5 days twice a month after dexamethasone/decadron. 15 mL 11   Insulin Pen Needle (INSUPEN PEN NEEDLES) 32G X 4 MM MISC USE AS DIRECTED TWICE DAILY WITH INSULIN 100 each 1   Multiple Vitamin (MULTIVITAMIN WITH MINERALS) TABS tablet Take 1 tablet by mouth daily. 60 tablet 2   ondansetron (ZOFRAN) 8 MG tablet Take 1 tablet (8 mg total) by mouth 2 (two) times daily as needed (Nausea or vomiting). 30 tablet 1   pomalidomide (POMALYST) 2 MG capsule Take 1 capsule (2 mg total) by mouth daily. 3 weeks on 1 week off 21 capsule 0   potassium chloride SA (KLOR-CON M) 20 MEQ tablet Take 1 tablet (20 mEq total) by mouth daily. 30 tablet 1   pravastatin (PRAVACHOL) 20 MG tablet Take 1 tablet (20 mg total) by mouth daily. 90 tablet 1   prochlorperazine (COMPAZINE) 10 MG tablet Take 1 tablet (10 mg total) by mouth every 6 (six) hours as needed (Nausea or vomiting). 30 tablet 1   tiZANidine (ZANAFLEX) 4 MG tablet Take 1 tablet (4 mg total) by mouth every 8 (eight) hours as needed for muscle spasms. 90 tablet 5   valsartan (DIOVAN) 40 MG tablet Take 1 tablet (40 mg total) by mouth daily. STOP LISINOPRIL 90 tablet 3   valsartan (DIOVAN) 40 MG tablet Take 1 tablet (40 mg total) by mouth daily. STOP LISINOPRIL     No current facility-administered medications for this visit.    REVIEW OF  SYSTEMS:  10 Point review  of Systems was done is negative except as noted above.   PHYSICAL EXAMINATION: .There were no vitals taken for this visit.   GENERAL:alert, in no acute distress and comfortable SKIN: no acute rashes, no significant lesions EYES: conjunctiva are pink and non-injected, sclera anicteric OROPHARYNX: MMM, no exudates, no oropharyngeal erythema or ulceration NECK: supple, no JVD LYMPH:  no palpable lymphadenopathy in the cervical, axillary or inguinal regions LUNGS: clear to auscultation b/l with normal respiratory effort HEART: regular rate & rhythm ABDOMEN:  normoactive bowel sounds , non tender, not distended. Extremity: no pedal edema PSYCH: alert & oriented x 3 with fluent speech NEURO: no focal motor/sensory deficits   LABORATORY DATA:  I have reviewed the data as listed  .    Latest Ref Rng & Units 08/01/2023    7:43 AM 07/18/2023   10:49 AM 07/05/2023    9:48 AM  CBC  WBC 4.0 - 10.5 K/uL 3.7  4.4  4.1   Hemoglobin 13.0 - 17.0 g/dL 9.2  72.5  9.9   Hematocrit 39.0 - 52.0 % 27.6  30.0  29.3   Platelets 150 - 400 K/uL 238  228  253       Latest Ref Rng & Units 08/01/2023    7:43 AM 07/18/2023   10:49 AM 07/05/2023    9:48 AM  CMP  Glucose 70 - 99 mg/dL 366  440  347   BUN 8 - 23 mg/dL 14  17  15    Creatinine 0.61 - 1.24 mg/dL 4.25  9.56  3.87   Sodium 135 - 145 mmol/L 138  141  140   Potassium 3.5 - 5.1 mmol/L 3.4  3.9  3.8   Chloride 98 - 111 mmol/L 106  108  108   CO2 22 - 32 mmol/L 26  25  27    Calcium 8.9 - 10.3 mg/dL 9.2  9.5  9.6   Total Protein 6.5 - 8.1 g/dL 6.3  6.2  6.4   Total Bilirubin 0.0 - 1.2 mg/dL 0.7  0.7  0.5   Alkaline Phos 38 - 126 U/L 52  74  58   AST 15 - 41 U/L 15  15  15    ALT 0 - 44 U/L 15  17  20       08/29/17 BM Bx:    08/29/17 Cytogenetics:   03/13/17 Cytogenetics:        PATHOLOGY Surgical Pathology  CASE: [id number]  PATIENT: Torie Rudnick  Bone Marrow Report      Clinical History:  Multiple myeloma in relapse (HCC) ,(BH)      DIAGNOSIS:   BONE MARROW, ASPIRATE, CLOT, CORE:  -Hypercellular bone marrow for age with plasma cell neoplasm  -See comment   PERIPHERAL BLOOD:  -Mild normocytic-normochromic anemia  -Eosinophilia   COMMENT:   The bone marrow is hypercellular for age with increased number of plasma  cells representing 8% of all cells in the aspirate associated with  numerous small clusters in the clot and biopsy sections. The plasma  cells display lambda light chain restriction consistent with plasma cell  neoplasm.  The background shows trilineage hematopoiesis with generally  nonspecific myeloid changes, likely secondary in nature in this setting.  Correlation with cytogenetic and FISH studies is recommended.      RADIOGRAPHIC STUDIES: I have personally reviewed the radiological images as listed and agreed with the findings in the report. No results found.  ASSESSMENT & PLAN:   78 y.o. male with   1.  Relapsed IgA Lambda Multiple Myeloma ISS Stage I    Active disease was previously diagnosed based on presence of anemia, kidney insufficiency, and paraproteinemia with significant predominance of lambda light chains as well as significant elevation of IgA. Patient is status post patient was treated with induction bortezomib Revlimid dexamethasone.  Had issues with skin rashes with Revlimid and this was discontinued.  Completed bortezomib dexamethasone induction and achieved complete remission. Was on maintenance Velcade 2 weeks which was then switched to maintenance Ninlaro. Patient had issues with worsening neuropathy which was a combination of Velcade and his diabetes.  Ninlaro was discontinued as well after maintenance of more than 2 years.  2.  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 Bone marrow biopsy shows 8% lambda restricted plasma cells PET CT scan  with no hypermetabolic osseous lesions  3.  Hypertension 4.  Diabetes type 2 5 .chronic kidney disease due to hypertension and diabetes. 6.  Peripheral neuropathy related to diabetes and previous myeloma treatments.  PLAN:  -Discussed lab results on 09/26/23 in detail with patient. CBC stable, showed WBC of 5.3K, hemoglobin of 9.7, and platelets of 195K. -mild anemia -CMP shows stable CKD -myeloma panel from one month ago showed stable M protein of 0.2 g/dL -no clinical sign or evidence of myeloma progression at this time  -patient has no major toxicities with Daratumumab and Pomalidomide.  -continue daratumumab every two weeks -continue Pomalidomide as prescribed -discussed option of voltaren gel to use once in a while to improve back pain. Discussed need to limit use due to kidney issues.  -also discussed option of using a low back brace to improve his back pain  FOLLOW-UP: Per integrated scheduling  The total time spent in the appointment was *** minutes* .  All of the patient's questions were answered with apparent satisfaction. The patient knows to call the clinic with any problems, questions or concerns.   Wyvonnia Lora MD MS AAHIVMS [facility] Hematology/Oncology Physician [facility]  .*Total Encounter Time as defined by the Centers for Medicare and Medicaid Services includes, in addition to the face-to-face time of a patient visit (documented in the note above) non-face-to-face time: obtaining and reviewing outside history, ordering and reviewing medications, tests or procedures, care coordination (communications with other health care professionals or caregivers) and documentation in the medical record.  I,Mitra Faeizi,acting as a Neurosurgeon for Wyvonnia Lora, MD.,have documented all relevant documentation on the behalf of Wyvonnia Lora, MD,as directed by  Wyvonnia Lora, MD while in the presence of Wyvonnia Lora, MD.  ***

## 2023-09-27 ENCOUNTER — Other Ambulatory Visit: Payer: Self-pay

## 2023-09-27 ENCOUNTER — Encounter: Payer: Self-pay | Admitting: Hematology

## 2023-09-27 LAB — KAPPA/LAMBDA LIGHT CHAINS
Kappa free light chain: 59.5 mg/L — ABNORMAL HIGH (ref 3.3–19.4)
Kappa, lambda light chain ratio: 1.83 — ABNORMAL HIGH (ref 0.26–1.65)
Lambda free light chains: 32.5 mg/L — ABNORMAL HIGH (ref 5.7–26.3)

## 2023-09-28 LAB — MULTIPLE MYELOMA PANEL, SERUM
Albumin SerPl Elph-Mcnc: 3.4 g/dL (ref 2.9–4.4)
Albumin/Glob SerPl: 1.5 (ref 0.7–1.7)
Alpha 1: 0.2 g/dL (ref 0.0–0.4)
Alpha2 Glob SerPl Elph-Mcnc: 0.7 g/dL (ref 0.4–1.0)
B-Globulin SerPl Elph-Mcnc: 0.9 g/dL (ref 0.7–1.3)
Gamma Glob SerPl Elph-Mcnc: 0.5 g/dL (ref 0.4–1.8)
Globulin, Total: 2.4 g/dL (ref 2.2–3.9)
IgA: 128 mg/dL (ref 61–437)
IgG (Immunoglobin G), Serum: 593 mg/dL — ABNORMAL LOW (ref 603–1613)
IgM (Immunoglobulin M), Srm: 38 mg/dL (ref 15–143)
Total Protein ELP: 5.8 g/dL — ABNORMAL LOW (ref 6.0–8.5)

## 2023-10-02 ENCOUNTER — Encounter: Payer: Self-pay | Admitting: Hematology

## 2023-10-03 ENCOUNTER — Other Ambulatory Visit: Payer: Self-pay

## 2023-10-04 ENCOUNTER — Ambulatory Visit: Payer: 59 | Attending: Emergency Medicine | Admitting: Audiologist

## 2023-10-04 DIAGNOSIS — H903 Sensorineural hearing loss, bilateral: Secondary | ICD-10-CM | POA: Insufficient documentation

## 2023-10-04 DIAGNOSIS — H6991 Unspecified Eustachian tube disorder, right ear: Secondary | ICD-10-CM | POA: Diagnosis not present

## 2023-10-04 NOTE — Procedures (Signed)
  Outpatient Audiology and [facility] [address] [place], [place]  [address] [phone]  AUDIOLOGICAL  EVALUATION  NAME: Robert Sparks     DOB:   [birth year]      MRN: [id number]                                                                                     DATE: 10/04/2023     REFERENT: Marcine Matar, MD STATUS: Outpatient DIAGNOSIS: Sensorineural Hearing Loss   History: Robert Sparks was seen for an audiological evaluation due to concerns for sudden onset of right ear hearing loss. He went to Urgent Care. He says they gave him some allergy nose spray and now his hearing is back. He has no concerns about the right ear.  Robert Sparks denies pain, pressure, or tinnitus. He denies any difficulty hearing now in either ear.  Robert Sparks significant history of hazardous noise exposure from working in [employer]  Medical history shows history of neuropathy which can be a risk for hearing loss.    Evaluation:  Otoscopy showed a clear view of the tympanic membranes, bilaterally Tympanometry results were consistent with normal middle ear compliance with a shallow response in the right ear Audiometric testing was completed using Conventional Audiometry techniques with insert earphones and supraural headphones. Test results are consistent with mils hearing loss notched at [facility] bilaterally. Speech Recognition Thresholds were obtained at 30dB HL in the right ear and at 25dB HL in the left ear. Word Recognition Testing was completed at  40dB SL and Robert Sparks scored 100% in each ear.   Results:  The test results were reviewed with [facility]. Robert Sparks has a mild hearing loss in both ears. He is not yet a hearing aid candidate. Hearing is symmetric. Sudden loss likely due to eustachian tube dysfunction. Robert Sparks denies any difficulty hearing. He was advised to return for testing if he starts to perceive difficulty as his hearing will likely progress.   Recommendations: No further testing is  recommended at this time. If hearing difficulties are observed further audiological testing is recommended.         35 minutes spent testing and counseling on results.   If you have any questions please feel free to contact me at [phone].  Ammie Ferrier Stalnaker Au.D.  Audiologist   10/04/2023  10:17 AM  Cc: Marcine Matar, MD

## 2023-10-05 ENCOUNTER — Other Ambulatory Visit: Payer: Self-pay

## 2023-10-10 ENCOUNTER — Inpatient Hospital Stay: Attending: Hematology

## 2023-10-10 ENCOUNTER — Inpatient Hospital Stay

## 2023-10-10 ENCOUNTER — Inpatient Hospital Stay (HOSPITAL_BASED_OUTPATIENT_CLINIC_OR_DEPARTMENT_OTHER): Admitting: Hematology

## 2023-10-10 VITALS — BP 116/58 | HR 67 | Temp 97.7°F | Resp 18 | Ht 66.0 in | Wt 163.3 lb

## 2023-10-10 DIAGNOSIS — I129 Hypertensive chronic kidney disease with stage 1 through stage 4 chronic kidney disease, or unspecified chronic kidney disease: Secondary | ICD-10-CM | POA: Diagnosis not present

## 2023-10-10 DIAGNOSIS — Z7189 Other specified counseling: Secondary | ICD-10-CM

## 2023-10-10 DIAGNOSIS — C9002 Multiple myeloma in relapse: Secondary | ICD-10-CM

## 2023-10-10 DIAGNOSIS — N183 Chronic kidney disease, stage 3 unspecified: Secondary | ICD-10-CM | POA: Diagnosis not present

## 2023-10-10 DIAGNOSIS — D649 Anemia, unspecified: Secondary | ICD-10-CM | POA: Insufficient documentation

## 2023-10-10 DIAGNOSIS — E1122 Type 2 diabetes mellitus with diabetic chronic kidney disease: Secondary | ICD-10-CM | POA: Insufficient documentation

## 2023-10-10 DIAGNOSIS — Z5112 Encounter for antineoplastic immunotherapy: Secondary | ICD-10-CM | POA: Diagnosis not present

## 2023-10-10 LAB — CMP (CANCER CENTER ONLY)
ALT: 13 U/L (ref 0–44)
AST: 13 U/L — ABNORMAL LOW (ref 15–41)
Albumin: 3.7 g/dL (ref 3.5–5.0)
Alkaline Phosphatase: 68 U/L (ref 38–126)
Anion gap: 5 (ref 5–15)
BUN: 16 mg/dL (ref 8–23)
CO2: 27 mmol/L (ref 22–32)
Calcium: 9.2 mg/dL (ref 8.9–10.3)
Chloride: 107 mmol/L (ref 98–111)
Creatinine: 1.55 mg/dL — ABNORMAL HIGH (ref 0.61–1.24)
GFR, Estimated: 46 mL/min — ABNORMAL LOW (ref >60)
Glucose, Bld: 209 mg/dL — ABNORMAL HIGH (ref 70–99)
Potassium: 4.3 mmol/L (ref 3.5–5.1)
Sodium: 139 mmol/L (ref 135–145)
Total Bilirubin: 0.6 mg/dL (ref 0.0–1.2)
Total Protein: 6.1 g/dL — ABNORMAL LOW (ref 6.5–8.1)

## 2023-10-10 LAB — CBC WITH DIFFERENTIAL (CANCER CENTER ONLY)
Abs Immature Granulocytes: 0.01 10*3/uL (ref 0.00–0.07)
Basophils Absolute: 0.1 10*3/uL (ref 0.0–0.1)
Basophils Relative: 2 %
Eosinophils Absolute: 0.1 10*3/uL (ref 0.0–0.5)
Eosinophils Relative: 2 %
HCT: 29 % — ABNORMAL LOW (ref 39.0–52.0)
Hemoglobin: 9.7 g/dL — ABNORMAL LOW (ref 13.0–17.0)
Immature Granulocytes: 0 %
Lymphocytes Relative: 40 %
Lymphs Abs: 1.8 10*3/uL (ref 0.7–4.0)
MCH: 32.7 pg (ref 26.0–34.0)
MCHC: 33.4 g/dL (ref 30.0–36.0)
MCV: 97.6 fL (ref 80.0–100.0)
Monocytes Absolute: 0.5 10*3/uL (ref 0.1–1.0)
Monocytes Relative: 12 %
Neutro Abs: 1.9 10*3/uL (ref 1.7–7.7)
Neutrophils Relative %: 44 %
Platelet Count: 277 10*3/uL (ref 150–400)
RBC: 2.97 MIL/uL — ABNORMAL LOW (ref 4.22–5.81)
RDW: 14.1 % (ref 11.5–15.5)
WBC Count: 4.4 10*3/uL (ref 4.0–10.5)
nRBC: 0 % (ref 0.0–0.2)

## 2023-10-10 MED ORDER — DIPHENHYDRAMINE HCL 25 MG PO CAPS
50.0000 mg | ORAL_CAPSULE | Freq: Once | ORAL | Status: AC
  Administered 2023-10-10: 50 mg via ORAL
  Filled 2023-10-10: qty 2

## 2023-10-10 MED ORDER — DARATUMUMAB-HYALURONIDASE-FIHJ 1800-30000 MG-UT/15ML ~~LOC~~ SOLN
1800.0000 mg | Freq: Once | SUBCUTANEOUS | Status: AC
  Administered 2023-10-10: 1800 mg via SUBCUTANEOUS
  Filled 2023-10-10: qty 15

## 2023-10-10 MED ORDER — FAMOTIDINE 20 MG PO TABS
20.0000 mg | ORAL_TABLET | Freq: Once | ORAL | Status: AC
  Administered 2023-10-10: 20 mg via ORAL
  Filled 2023-10-10: qty 1

## 2023-10-10 MED ORDER — DEXAMETHASONE 4 MG PO TABS
20.0000 mg | ORAL_TABLET | Freq: Once | ORAL | Status: AC
Start: 2023-10-10 — End: 2023-10-10
  Administered 2023-10-10: 20 mg via ORAL
  Filled 2023-10-10: qty 5

## 2023-10-10 MED ORDER — ACETAMINOPHEN 325 MG PO TABS
650.0000 mg | ORAL_TABLET | Freq: Once | ORAL | Status: AC
  Administered 2023-10-10: 650 mg via ORAL
  Filled 2023-10-10: qty 2

## 2023-10-10 NOTE — Progress Notes (Signed)
 Patient seen by Dr. Addison Naegeli are not all within treatment parameters.   Dr Candise Che aware BP 116/58  Labs reviewed: and are not all within treatment parameters.   Dr Candise Che aware CR: 1.55,   Per physician team, patient is ready for treatment and there are NO modifications to the treatment plan.

## 2023-10-10 NOTE — Patient Instructions (Signed)
 [facility] - A DEPT OF [facility]  Discharge Instructions: Thank you for choosing [facility] to provide your oncology and hematology care.   If you have a lab appointment with the Cancer Center, please go directly to the Cancer Center and check in at the registration area.   Wear comfortable clothing and clothing appropriate for easy access to any Portacath or PICC line.   We strive to give you quality time with your provider. You may need to reschedule your appointment if you arrive late (15 or more minutes).  Arriving late affects you and other patients whose appointments are after yours.  Also, if you miss three or more appointments without notifying the office, you may be dismissed from the clinic at the provider's discretion.      For prescription refill requests, have your pharmacy contact our office and allow 72 hours for refills to be completed.    Today you received the following chemotherapy and/or immunotherapy agents: Dara Faspro.       To help prevent nausea and vomiting after your treatment, we encourage you to take your nausea medication as directed.  BELOW ARE SYMPTOMS THAT SHOULD BE REPORTED IMMEDIATELY: *FEVER GREATER THAN 100.4 F (38 C) OR HIGHER *CHILLS OR SWEATING *NAUSEA AND VOMITING THAT IS NOT CONTROLLED WITH YOUR NAUSEA MEDICATION *UNUSUAL SHORTNESS OF BREATH *UNUSUAL BRUISING OR BLEEDING *URINARY PROBLEMS (pain or burning when urinating, or frequent urination) *BOWEL PROBLEMS (unusual diarrhea, constipation, pain near the anus) TENDERNESS IN MOUTH AND THROAT WITH OR WITHOUT PRESENCE OF ULCERS (sore throat, sores in mouth, or a toothache) UNUSUAL RASH, SWELLING OR PAIN  UNUSUAL VAGINAL DISCHARGE OR ITCHING   Items with * indicate a potential emergency and should be followed up as soon as possible or go to the Emergency Department if any problems should occur.  Please show the CHEMOTHERAPY ALERT CARD or  IMMUNOTHERAPY ALERT CARD at check-in to the Emergency Department and triage nurse.  Should you have questions after your visit or need to cancel or reschedule your appointment, please contact [facility] - A DEPT OF [facility]  Dept: [phone]  and follow the prompts.  Office hours are 8:00 a.m. to 4:30 p.m. Monday - Friday. Please note that voicemails left after 4:00 p.m. may not be returned until the following business day.  We are closed weekends and major holidays. You have access to a nurse at all times for urgent questions. Please call the main number to the clinic Dept: [phone] and follow the prompts.   For any non-urgent questions, you may also contact your provider using MyChart. We now offer e-Visits for anyone 58 and older to request care online for non-urgent symptoms. For details visit [email or web address].   Also download the MyChart app! Go to the app store, search "MyChart", open the app, select [place], and log in with your MyChart username and password.

## 2023-10-10 NOTE — Progress Notes (Signed)
 HEMATOLOGY/ONCOLOGY CLINIC NOTE  Date of Service: 10/10/23   Patient Care Team: Marcine Matar, MD as PCP - General (Internal Medicine)  CHIEF COMPLAINTS/PURPOSE OF CONSULTATION:  Follow-up for continued evaluation and management of relapsed multiple myeloma  Oncologic History:  78 y.o. male with diagnosis of IgA lambda active multiple myeloma, ISS Stage I. Active disease diagnosed based on presence of anemia, kidney insufficiency, and paraproteinemia with significant predominance of lambda light chains as well as significant elevation of IgA. Bone marrow biopsy confirmed presence of atypical monoclonal plasma cell process in the bone marrow comprising at least 7% of the cellularity. Based on the findings, patient was started on treatment with lenalidomide, bortezomib, and low-dose dexamethasone based on the anticipated tolerance by the patient. Lenalidomide was started at 10 mg daily based on creatinine clearance of 42, dexamethasone dose was reduced to 20 mg weekly based on patient's age. Treatment was complicated by rapid development of cutaneous rash attributed to lenalidomide, inaddition to decreased renal function and now patient has persistent severe anemia. Side-effects were attributed to Lenalidomide and lenalidomide was discontinued with subsequent recovery.  Patient has been receiving bortezomib weekly with low-dose dexamethasone in 4-day cycles.   Patient has completed induction systemic therapy for his disease with repeat bone marrow biopsy confirming complete response including no evidence of minimal residual disease by cytogenetics or FISH.  HISTORY OF PRESENTING ILLNESS:  Please see previous note for details of initial presentation.  INTERVAL HISTORY:   [name] is a 78 y.o. male is here for continued evaluation and management of his relapsed multiple myeloma. Patient was last seen by me on 09/26/2023 and complained of mild right head pain above the eye, night  sweats sometimes, back pain, and mild diarrhea and constipation sometimes.  Today, he reports that he has been doing well since his last clinical visit.   His blood pressure was noted to be 116/58 in clinic today.   Patient has been tolerating treatment well with no new or major toxicity issues.   Patient reports normal energy levels and p.o. intake. He denies any new leg swelling, new SOB, chest pain, new bone pain, uncontrolled nausea, vomiting, abdominal pain, infection issues,   He reports endorsing diarrhea occasionally.   He denies any upcoming travel plans at this time.   MEDICAL HISTORY:  Past Medical History:  Diagnosis Date   Anemia    Arthritis    shoulders,feet    Cataract    per eye dr -has appt 5-11   CKD (chronic kidney disease), stage III (HCC)    Elevated PSA    History of ketoacidosis    03-29-2015    History of sepsis    07-25-2013  non-compliant w/ medication   Hyperlipidemia    Hypertension    Nocturia    Peripheral neuropathy    Prostate cancer (HCC)    Type 2 diabetes mellitus with insulin therapy [facility])     SURGICAL HISTORY: Past Surgical History:  Procedure Laterality Date   CIRCUMCISION  1990's   PROSTATE BIOPSY N/A 12/21/2015   Procedure: BIOPSY TRANSRECTAL ULTRASONIC PROSTATE (TUBP);  Surgeon: Jerilee Field, MD;  Location: [facility];  Service: Urology;  Laterality: N/A;    SOCIAL HISTORY: Social History   Socioeconomic History   Marital status: Single    Spouse name: Not on file   Number of children: Not on file   Years of education: Not on file   Highest education level: Not on file  Occupational History   Not on file  Tobacco Use   Smoking status: Every Day    Current packs/day: 0.33    Average packs/day: 0.3 packs/day for 50.0 years (16.5 ttl pk-yrs)    Types: Cigarettes   Smokeless tobacco: Never   Tobacco comments:    1 ppwk-4-5 a day   Vaping Use   Vaping status: Never Used  Substance and Sexual  Activity   Alcohol use: Yes    Alcohol/week: 1.0 standard drink of alcohol    Types: 1 Cans of beer per week    Comment: a beer every now and again   Drug use: No   Sexual activity: Not Currently  Other Topics Concern   Not on file  Social History Narrative   Not on file   Social Drivers of Health   Financial Resource Strain: Low Risk  (05/11/2023)   Overall Financial Resource Strain (CARDIA)    Difficulty of Paying Living Expenses: Not hard at all  Food Insecurity: No Food Insecurity (05/11/2023)   Hunger Vital Sign    Worried About Running Out of Food in the Last Year: Never true    Ran Out of Food in the Last Year: Never true  Transportation Needs: No Transportation Needs (05/11/2023)   PRAPARE - Administrator, Civil Service (Medical): No    Lack of Transportation (Non-Medical): No  Physical Activity: Inactive (05/11/2023)   Exercise Vital Sign    Days of Exercise per Week: 0 days    Minutes of Exercise per Session: 0 min  Stress: No Stress Concern Present (05/11/2023)   [employer] of Occupational Health - Occupational Stress Questionnaire    Feeling of Stress : Not at all  Social Connections: Moderately Isolated (05/11/2023)   Social Connection and Isolation Panel [NHANES]    Frequency of Communication with Friends and Family: Three times a week    Frequency of Social Gatherings with Friends and Family: Twice a week    Attends Religious Services: Never    Database administrator or Organizations: Yes    Attends Engineer, structural: More than 4 times per year    Marital Status: Never married  Intimate Partner Violence: Not At Risk (05/11/2023)   Humiliation, Afraid, Rape, and Kick questionnaire    Fear of Current or Ex-Partner: No    Emotionally Abused: No    Physically Abused: No    Sexually Abused: No    FAMILY HISTORY: Family History  Problem Relation Age of Onset   Kidney disease Mother    Cancer Neg Hx    Colon cancer Neg Hx    Colon  polyps Neg Hx    Esophageal cancer Neg Hx    Rectal cancer Neg Hx    Stomach cancer Neg Hx     ALLERGIES:  is allergic to other and lisinopril.  MEDICATIONS:  Current Outpatient Medications  Medication Sig Dispense Refill   Accu-Chek Softclix Lancets lancets Use to test blood sugar 3 times daily. 100 each 3   acetaminophen (TYLENOL) 500 MG tablet Take 2 tablets (1,000 mg total) by mouth every 6 (six) hours as needed. 30 tablet 0   amLODipine (NORVASC) 10 MG tablet Take 1 tablet (10 mg total) by mouth daily. 90 tablet 1   aspirin EC 81 MG tablet Take 81 mg by mouth daily. Swallow whole.     b complex vitamins capsule Take 1 capsule by mouth daily. 30 capsule 5   Blood Glucose Monitoring Suppl (ACCU-CHEK  GUIDE) w/Device KIT Use to test blood sugar three times daily 1 kit 0   buPROPion (WELLBUTRIN) 75 MG tablet Take 1 tablet (75 mg total) by mouth 2 (two) times daily. To reduce urge of smoking. 60 tablet 3   cetirizine (ZYRTEC) 5 MG tablet Take 1 tablet (5 mg total) by mouth at bedtime. 30 tablet 1   Continuous Blood Gluc Receiver (FREESTYLE LIBRE READER) DEVI Use to check blood sugar 3x daily. E11.42 1 each 0   Continuous Blood Gluc Sensor (FREESTYLE LIBRE SENSOR SYSTEM) MISC Use to check blood sugar 3 times daily. Change sensor every 2 weeks. 2 each 12   donepezil (ARICEPT) 5 MG tablet TAKE 1 TABLET (5 MG TOTAL) BY MOUTH AT BEDTIME. FOR MEMORY ISSUES 30 tablet 5   fluticasone (FLONASE) 50 MCG/ACT nasal spray Place 2 sprays into both nostrils daily. 16 g 2   furosemide (LASIX) 20 MG tablet Take 1 tablet (20 mg total) by mouth daily in the morning for 7 days (until 09/08/22). Save remaining tablets. 30 tablet 0   gabapentin (NEURONTIN) 300 MG capsule Take 1 capsule (300 mg total) by mouth every morning AND 1 capsule (300 mg total) daily at 12 noon AND 2 capsules (600 mg total) at bedtime. Stop Lyrica. 120 capsule 11   glucose blood (ACCU-CHEK GUIDE TEST) test strip Use to test blood sugar 3 times  daily. 100 each 2   Insulin Lispro Prot & Lispro (HUMALOG MIX 75/25 KWIKPEN) (75-25) 100 UNIT/ML Kwikpen Inject 9 units subcutaneously with breakfast and dinner. Increase to 12 units for 3-5 days twice a month after dexamethasone/decadron. 15 mL 11   Insulin Pen Needle (INSUPEN PEN NEEDLES) 32G X 4 MM MISC USE AS DIRECTED TWICE DAILY WITH INSULIN 100 each 1   Multiple Vitamin (MULTIVITAMIN WITH MINERALS) TABS tablet Take 1 tablet by mouth daily. 60 tablet 2   ondansetron (ZOFRAN) 8 MG tablet Take 1 tablet (8 mg total) by mouth 2 (two) times daily as needed (Nausea or vomiting). 30 tablet 1   pomalidomide (POMALYST) 2 MG capsule Take 1 capsule (2 mg total) by mouth daily. 3 weeks on 1 week off 21 capsule 0   potassium chloride SA (KLOR-CON M) 20 MEQ tablet Take 1 tablet (20 mEq total) by mouth daily. 30 tablet 1   pravastatin (PRAVACHOL) 20 MG tablet Take 1 tablet (20 mg total) by mouth daily. 90 tablet 1   prochlorperazine (COMPAZINE) 10 MG tablet Take 1 tablet (10 mg total) by mouth every 6 (six) hours as needed (Nausea or vomiting). 30 tablet 1   tiZANidine (ZANAFLEX) 4 MG tablet Take 1 tablet (4 mg total) by mouth every 8 (eight) hours as needed for muscle spasms. 90 tablet 5   valsartan (DIOVAN) 40 MG tablet Take 1 tablet (40 mg total) by mouth daily. STOP LISINOPRIL 90 tablet 3   valsartan (DIOVAN) 40 MG tablet Take 1 tablet (40 mg total) by mouth daily. STOP LISINOPRIL     No current facility-administered medications for this visit.    REVIEW OF SYSTEMS:  10 Point review of Systems was done is negative except as noted above.   PHYSICAL EXAMINATION: .BP (!) 116/58 (BP Location: Left Arm, Patient Position: Sitting)   Pulse 67   Temp 97.7 F (36.5 C) (Tympanic)   Resp 18   Ht 5\' 6"  (1.676 m)   Wt 163 lb 4.8 oz (74.1 kg)   SpO2 100%   BMI 26.36 kg/m  GENERAL:alert, in no acute distress and comfortable  SKIN: no acute rashes, no significant lesions EYES: conjunctiva are pink and  non-injected, sclera anicteric OROPHARYNX: MMM, no exudates, no oropharyngeal erythema or ulceration NECK: supple, no JVD LYMPH:  no palpable lymphadenopathy in the cervical, axillary or inguinal regions LUNGS: clear to auscultation b/l with normal respiratory effort HEART: regular rate & rhythm ABDOMEN:  normoactive bowel sounds , non tender, not distended. Extremity: no pedal edema PSYCH: alert & oriented x 3 with fluent speech NEURO: no focal motor/sensory deficits   LABORATORY DATA:  I have reviewed the data as listed  .    Latest Ref Rng & Units 10/10/2023    9:13 AM 09/26/2023    1:03 PM 08/01/2023    7:43 AM  CBC  WBC 4.0 - 10.5 K/uL 4.4  5.3  3.7   Hemoglobin 13.0 - 17.0 g/dL 9.7  9.7  9.2   Hematocrit 39.0 - 52.0 % 29.0  28.9  27.6   Platelets 150 - 400 K/uL 277  195  238       Latest Ref Rng & Units 10/10/2023    9:13 AM 09/26/2023    1:03 PM 08/01/2023    7:43 AM  CMP  Glucose 70 - 99 mg/dL 409  811  914   BUN 8 - 23 mg/dL 16  14  14    Creatinine 0.61 - 1.24 mg/dL 7.82  9.56  2.13   Sodium 135 - 145 mmol/L 139  137  138   Potassium 3.5 - 5.1 mmol/L 4.3  4.2  3.4   Chloride 98 - 111 mmol/L 107  106  106   CO2 22 - 32 mmol/L 27  25  26    Calcium 8.9 - 10.3 mg/dL 9.2  8.9  9.2   Total Protein 6.5 - 8.1 g/dL 6.1  6.0  6.3   Total Bilirubin 0.0 - 1.2 mg/dL 0.6  0.5  0.7   Alkaline Phos 38 - 126 U/L 68  51  52   AST 15 - 41 U/L 13  11  15    ALT 0 - 44 U/L 13  10  15       08/29/17 BM Bx:    08/29/17 Cytogenetics:   03/13/17 Cytogenetics:        PATHOLOGY Surgical Pathology  CASE: [id number]  PATIENT: Robert Sparks  Bone Marrow Report      Clinical History: Multiple myeloma in relapse (HCC) ,(BH)      DIAGNOSIS:   BONE MARROW, ASPIRATE, CLOT, CORE:  -Hypercellular bone marrow for age with plasma cell neoplasm  -See comment   PERIPHERAL BLOOD:  -Mild normocytic-normochromic anemia  -Eosinophilia   COMMENT:   The bone marrow is  hypercellular for age with increased number of plasma  cells representing 8% of all cells in the aspirate associated with  numerous small clusters in the clot and biopsy sections. The plasma  cells display lambda light chain restriction consistent with plasma cell  neoplasm.  The background shows trilineage hematopoiesis with generally  nonspecific myeloid changes, likely secondary in nature in this setting.  Correlation with cytogenetic and FISH studies is recommended.      RADIOGRAPHIC STUDIES: I have personally reviewed the radiological images as listed and agreed with the findings in the report. No results found.  ASSESSMENT & PLAN:   78 y.o. male with   1.  Relapsed IgA Lambda Multiple Myeloma ISS Stage I    Active disease was previously diagnosed based on presence of anemia, kidney insufficiency, and paraproteinemia with significant predominance  of lambda light chains as well as significant elevation of IgA. Patient is status post patient was treated with induction bortezomib Revlimid dexamethasone.  Had issues with skin rashes with Revlimid and this was discontinued.  Completed bortezomib dexamethasone induction and achieved complete remission. Was on maintenance Velcade 2 weeks which was then switched to maintenance Ninlaro. Patient had issues with worsening neuropathy which was a combination of Velcade and his diabetes.  Ninlaro was discontinued as well after maintenance of more than 2 years.  2.  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 Bone marrow biopsy shows 8% lambda restricted plasma cells PET CT scan with no hypermetabolic osseous lesions  3.  Hypertension 4.  Diabetes type 2 5 .chronic kidney disease due to hypertension and diabetes. 6.  Peripheral neuropathy related to diabetes and previous myeloma treatments.  PLAN:  -Discussed lab results on 10/10/23 in detail with patient.  CBC stable, showed WBC of 4.4K, hemoglobin of 9.7, and platelets of 277K. -his last myeloma panel from 2 weeks ago showed that his M protein was undetectable and K/L stable. -no clinical sign or evidence of myeloma progression at this time  -patient has no major toxicities with Daratumumab and Pomalidomide.  -continue daratumumab every two weeks -continue Pomalidomide as prescribed -answered all of patient's questions in detail  FOLLOW-UP: Per integrated scheduling MD visit in 6 weeks  The total time spent in the appointment was 30 minutes* .  All of the patient's questions were answered with apparent satisfaction. The patient knows to call the clinic with any problems, questions or concerns.   Wyvonnia Lora MD MS AAHIVMS [facility] Hematology/Oncology Physician [facility]  .*Total Encounter Time as defined by the Centers for Medicare and Medicaid Services includes, in addition to the face-to-face time of a patient visit (documented in the note above) non-face-to-face time: obtaining and reviewing outside history, ordering and reviewing medications, tests or procedures, care coordination (communications with other health care professionals or caregivers) and documentation in the medical record.    I,Mitra Faeizi,acting as a Neurosurgeon for Wyvonnia Lora, MD.,have documented all relevant documentation on the behalf of Wyvonnia Lora, MD,as directed by  Wyvonnia Lora, MD while in the presence of Wyvonnia Lora, MD.  .I have reviewed the above documentation for accuracy and completeness, and I agree with the above. Johney Maine MD

## 2023-10-11 LAB — KAPPA/LAMBDA LIGHT CHAINS
Kappa free light chain: 46.6 mg/L — ABNORMAL HIGH (ref 3.3–19.4)
Kappa, lambda light chain ratio: 1.78 — ABNORMAL HIGH (ref 0.26–1.65)
Lambda free light chains: 26.2 mg/L (ref 5.7–26.3)

## 2023-10-15 LAB — MULTIPLE MYELOMA PANEL, SERUM
Albumin SerPl Elph-Mcnc: 3.2 g/dL (ref 2.9–4.4)
Albumin/Glob SerPl: 1.5 (ref 0.7–1.7)
Alpha 1: 0.2 g/dL (ref 0.0–0.4)
Alpha2 Glob SerPl Elph-Mcnc: 0.7 g/dL (ref 0.4–1.0)
B-Globulin SerPl Elph-Mcnc: 0.8 g/dL (ref 0.7–1.3)
Gamma Glob SerPl Elph-Mcnc: 0.5 g/dL (ref 0.4–1.8)
Globulin, Total: 2.2 g/dL (ref 2.2–3.9)
IgA: 140 mg/dL (ref 61–437)
IgG (Immunoglobin G), Serum: 601 mg/dL — ABNORMAL LOW (ref 603–1613)
IgM (Immunoglobulin M), Srm: 33 mg/dL (ref 15–143)
Total Protein ELP: 5.4 g/dL — ABNORMAL LOW (ref 6.0–8.5)

## 2023-10-16 ENCOUNTER — Encounter: Payer: Self-pay | Admitting: Hematology

## 2023-10-18 ENCOUNTER — Other Ambulatory Visit: Payer: Self-pay

## 2023-10-19 ENCOUNTER — Other Ambulatory Visit: Payer: Self-pay | Admitting: Internal Medicine

## 2023-10-19 ENCOUNTER — Other Ambulatory Visit: Payer: Self-pay

## 2023-10-19 ENCOUNTER — Encounter: Payer: Self-pay | Admitting: Physical Medicine and Rehabilitation

## 2023-10-19 ENCOUNTER — Encounter: Payer: 59 | Attending: Physical Medicine and Rehabilitation | Admitting: Physical Medicine and Rehabilitation

## 2023-10-19 VITALS — BP 102/62 | HR 77 | Ht 66.0 in | Wt 162.0 lb

## 2023-10-19 DIAGNOSIS — F17200 Nicotine dependence, unspecified, uncomplicated: Secondary | ICD-10-CM | POA: Diagnosis not present

## 2023-10-19 DIAGNOSIS — M48062 Spinal stenosis, lumbar region with neurogenic claudication: Secondary | ICD-10-CM | POA: Diagnosis not present

## 2023-10-19 DIAGNOSIS — E1169 Type 2 diabetes mellitus with other specified complication: Secondary | ICD-10-CM

## 2023-10-19 DIAGNOSIS — C9002 Multiple myeloma in relapse: Secondary | ICD-10-CM | POA: Insufficient documentation

## 2023-10-19 MED ORDER — PRAVASTATIN SODIUM 20 MG PO TABS
20.0000 mg | ORAL_TABLET | Freq: Every day | ORAL | 1 refills | Status: DC
  Filled 2023-10-19: qty 90, 90d supply, fill #0
  Filled 2024-03-06: qty 90, 90d supply, fill #1

## 2023-10-19 NOTE — Patient Instructions (Signed)
 Patient is a 78 year old male with hx of R shoulder arthroscopy due to pain, and multiple myeloma as well as DM <7.5 last time and lumbar stenosis with neurogenic claudication.    Here for f/u  on chronic low back pain and overall  chronic pain.    Take Bupropion daily, no matter what- will help smoking urges much more! There's research to reduce back pain up to 25% after stopping smoking, so I really think it's appropriate.  Usually takes 3x/week. But will change to daily now- actually 2x/day- take at breakfast and lunch or early afternoon for 2nd dose so will sleep at night. Last filled 07/20/23  2. Takes Tizanidine 2x/day- can take it 3x/day if needed- does help sleep at night, but doesn't knock out during day, so can take 3x/day.  Doesn't need refills- filled 07/20/23  3. Still doesn't want surgery- because concerned about outcome.   4.   Gabapentin for tingling in feet- doesn't treat numbness- I would decrease gabapentin and take just in supper time and bedtime  try to reduce that dose- DON'T take in Am or afternoon- just at supper time and bedtime, 2 pills (so 3 pills/day) do for 1 week- - if that doesn't increase your nerve pain, just  take 2 pills at bedtime. That should help your balance.  So right now- takes 1 in AM, 1 in afternoon and 2 at night- so STOP AM dose for 1 week, then after 1 week, stop Supper time dose and keep 2 at bedtime.    5.   F/U in 3 months- but call me 2-4 weeks to let me know how nerve pain and back pain- if worse, will come up with another medicine for pain.    6. Educated on spinal stenosis and why having back pain and leg pain.

## 2023-10-19 NOTE — Progress Notes (Signed)
 Subjective:    Patient ID: Robert Sparks, male    DOB: [birth year], 78 y.o.   MRN: [id number]  HPI   Patient is a 78 year old male with hx of R shoulder arthroscopy due to pain, and multiple myeloma as well as DM <7.5 last time and lumbar stenosis with neurogenic claudication.    Here for f/u  on chronic low back pain and overall  chronic pain.     Doing about the same- not much has changed.   Been using lidocaine patches- help some, actually a whole lot.  Only hurts when doing something, at rest, no pain.    Taking Wellbutrin- taking only when doesn't smoke.  1 pack will last him 3 days- hasn't really reduced dose with Wellbutrin.  Wasn't a big smoker til recently-  changed and smoked more as got older.  Doesn't have SOB/DOE.     Zanaflex- increase to 4 mg working well- 2x/day- and more helpful than lower dose.  Helps him sleep well, but doesn't knock him out.    Still doesn't want surgery. Not interested.   Feels like off balance- comes close/near falls and then catches self- ~ 1x/month.    Feels off balance all the time, esp when stands- doesn't feel lightheaded, just off balance.   Takes Gabapentin 300 mg 4 pills/day. Doesn't help his nerve pain in feet much- might help a little. And toes  stay dry- between toes.  .  Also Has a bunion under big toe on  R foot- gets it cut down every 3 months to help it reduce in size.     Pain Inventory Average Pain 4 Pain Right Now 0 My pain is intermittent and aching  In the last 24 hours, has pain interfered with the following? General activity 8 Relation with others 0 Enjoyment of life 8 What TIME of day is your pain at its worst? daytime Sleep (in general) Good  Pain is worse with: walking, bending, and some activites Pain improves with: medication Relief from Meds: 5  Family History  Problem Relation Age of Onset   Kidney disease Mother    Cancer Neg Hx    Colon cancer Neg Hx    Colon polyps Neg Hx     Esophageal cancer Neg Hx    Rectal cancer Neg Hx    Stomach cancer Neg Hx    Social History   Socioeconomic History   Marital status: Single    Spouse name: Not on file   Number of children: Not on file   Years of education: Not on file   Highest education level: Not on file  Occupational History   Not on file  Tobacco Use   Smoking status: Every Day    Current packs/day: 0.33    Average packs/day: 0.3 packs/day for 50.0 years (16.5 ttl pk-yrs)    Types: Cigarettes   Smokeless tobacco: Never   Tobacco comments:    1 ppwk-4-5 a day   Vaping Use   Vaping status: Never Used  Substance and Sexual Activity   Alcohol use: Yes    Alcohol/week: 1.0 standard drink of alcohol    Types: 1 Cans of beer per week    Comment: a beer every now and again   Drug use: No   Sexual activity: Not Currently  Other Topics Concern   Not on file  Social History Narrative   Not on file   Social Drivers of Health   Financial Resource Strain: Low  Risk  (05/11/2023)   Overall Financial Resource Strain (CARDIA)    Difficulty of Paying Living Expenses: Not hard at all  Food Insecurity: No Food Insecurity (05/11/2023)   Hunger Vital Sign    Worried About Running Out of Food in the Last Year: Never true    Ran Out of Food in the Last Year: Never true  Transportation Needs: No Transportation Needs (05/11/2023)   PRAPARE - Administrator, Civil Service (Medical): No    Lack of Transportation (Non-Medical): No  Physical Activity: Inactive (05/11/2023)   Exercise Vital Sign    Days of Exercise per Week: 0 days    Minutes of Exercise per Session: 0 min  Stress: No Stress Concern Present (05/11/2023)   [employer] of Occupational Health - Occupational Stress Questionnaire    Feeling of Stress : Not at all  Social Connections: Moderately Isolated (05/11/2023)   Social Connection and Isolation Panel [NHANES]    Frequency of Communication with Friends and Family: Three times a week     Frequency of Social Gatherings with Friends and Family: Twice a week    Attends Religious Services: Never    Database administrator or Organizations: Yes    Attends Engineer, structural: More than 4 times per year    Marital Status: Never married   Past Surgical History:  Procedure Laterality Date   CIRCUMCISION  1990's   PROSTATE BIOPSY N/A 12/21/2015   Procedure: BIOPSY TRANSRECTAL ULTRASONIC PROSTATE (TUBP);  Surgeon: Jerilee Field, MD;  Location: [facility];  Service: Urology;  Laterality: N/A;   Past Surgical History:  Procedure Laterality Date   CIRCUMCISION  1990's   PROSTATE BIOPSY N/A 12/21/2015   Procedure: BIOPSY TRANSRECTAL ULTRASONIC PROSTATE (TUBP);  Surgeon: Jerilee Field, MD;  Location: [facility];  Service: Urology;  Laterality: N/A;   Past Medical History:  Diagnosis Date   Anemia    Arthritis    shoulders,feet    Cataract    per eye dr -has appt 5-11   CKD (chronic kidney disease), stage III (HCC)    Elevated PSA    History of ketoacidosis    03-29-2015    History of sepsis    07-25-2013  non-compliant w/ medication   Hyperlipidemia    Hypertension    Nocturia    Peripheral neuropathy    Prostate cancer (HCC)    Type 2 diabetes mellitus with insulin therapy (HCC)    BP 102/62   Pulse 77   Ht 5\' 6"  (1.676 m)   Wt 162 lb (73.5 kg)   SpO2 97%   BMI 26.15 kg/m   Opioid Risk Score:   Fall Risk Score:  `1  Depression screen [facility] 2/9     09/14/2023   10:58 AM 05/11/2023   10:42 AM 03/09/2023   12:47 PM 01/09/2023   10:27 AM 09/07/2022    9:16 AM 05/08/2022    9:20 AM 01/02/2022   11:06 AM  Depression screen PHQ 2/9  Decreased Interest 0 0 0 0 0 0 0  Down, Depressed, Hopeless 0 0 0 0 0 0 0  PHQ - 2 Score 0 0 0 0 0 0 0  Altered sleeping 1 1 2  0 1  0  Tired, decreased energy 0 0 1 0 0  0  Change in appetite 0 0 0 0 0  0  Feeling bad or failure about yourself  0 0 0 0 0  3  Trouble concentrating  0 0 0 0 0   0  Moving slowly or fidgety/restless 0 0 0 0 0  0  Suicidal thoughts 0 0 0 0 0  0  PHQ-9 Score 1 1 3  0 1  3  Difficult doing work/chores Not difficult at all Not difficult at all           Review of Systems  Musculoskeletal:  Positive for back pain.       Left hip pain  All other systems reviewed and are negative.      Objective:   Physical Exam  Awake, alert, appropriate, smells like smoke, NAD No increased pain with rotation and extension.  TTP across low back. In band      Assessment & Plan:   Patient is a 78 year old male with hx of R shoulder arthroscopy due to pain, and multiple myeloma as well as DM <7.5 last time and lumbar stenosis with neurogenic claudication.    Here for f/u  on chronic low back pain and overall  chronic pain.    Take Bupropion daily, no matter what- will help smoking urges much more! There's research to reduce back pain up to 25% after stopping smoking, so I really think it's appropriate.  Usually takes 3x/week. But will change to daily now- actually 2x/day- take at breakfast and lunch or early afternoon for 2nd dose so will sleep at night. Last filled 07/20/23  2. Takes Tizanidine 2x/day- can take it 3x/day if needed- does help sleep at night, but doesn't knock out during day, so can take 3x/day.  Doesn't need refills- filled 07/20/23  3. Still doesn't want surgery- because concerned about outcome.   4.   Gabapentin for tingling in feet- doesn't treat numbness- I would decrease gabapentin and take just in supper time and bedtime  try to reduce that dose- DON'T take in Am or afternoon- just at supper time and bedtime, 2 pills (so 3 pills/day) do for 1 week- - if that doesn't increase your nerve pain, just  take 2 pills at bedtime. That should help your balance.  So right now- takes 1 in AM, 1 in afternoon and 2 at night- so STOP AM dose for 1 week, then after 1 week, stop Supper time dose and keep 2 at bedtime.    5.   F/U in 3 months- but call me  2-4 weeks to let me know how nerve pain and back pain- if worse, will come up with another medicine for pain.    6. Educated on spinal stenosis and why having back pain and leg pain.     I spent a total of  31  minutes on total care today- >50% coordination of care- due to education on spinal stenosis- multiple iterations of how to change Gabapentin. And d/w pt about Bupropion to take 2x/day. Also went over other meds and discussed his smoking as well as his LE nerve pain and how gabapentin works.

## 2023-10-20 ENCOUNTER — Other Ambulatory Visit: Payer: Self-pay

## 2023-10-22 ENCOUNTER — Other Ambulatory Visit: Payer: Self-pay

## 2023-10-25 ENCOUNTER — Other Ambulatory Visit: Payer: Self-pay

## 2023-10-25 DIAGNOSIS — C9002 Multiple myeloma in relapse: Secondary | ICD-10-CM

## 2023-10-25 MED ORDER — POMALIDOMIDE 2 MG PO CAPS: 2.0000 mg | ORAL_CAPSULE | Freq: Every day | ORAL | 0 refills | Status: DC

## 2023-11-06 ENCOUNTER — Other Ambulatory Visit: Payer: Self-pay

## 2023-11-06 DIAGNOSIS — C9002 Multiple myeloma in relapse: Secondary | ICD-10-CM

## 2023-11-07 ENCOUNTER — Inpatient Hospital Stay

## 2023-11-07 ENCOUNTER — Inpatient Hospital Stay (HOSPITAL_BASED_OUTPATIENT_CLINIC_OR_DEPARTMENT_OTHER): Admitting: Hematology

## 2023-11-07 VITALS — BP 141/60 | HR 60 | Temp 97.7°F | Resp 18 | Ht 66.0 in | Wt 162.7 lb

## 2023-11-07 DIAGNOSIS — C9002 Multiple myeloma in relapse: Secondary | ICD-10-CM | POA: Diagnosis not present

## 2023-11-07 DIAGNOSIS — E1122 Type 2 diabetes mellitus with diabetic chronic kidney disease: Secondary | ICD-10-CM | POA: Diagnosis not present

## 2023-11-07 DIAGNOSIS — Z7189 Other specified counseling: Secondary | ICD-10-CM

## 2023-11-07 DIAGNOSIS — N183 Chronic kidney disease, stage 3 unspecified: Secondary | ICD-10-CM | POA: Diagnosis not present

## 2023-11-07 DIAGNOSIS — D649 Anemia, unspecified: Secondary | ICD-10-CM | POA: Diagnosis not present

## 2023-11-07 DIAGNOSIS — Z5111 Encounter for antineoplastic chemotherapy: Secondary | ICD-10-CM

## 2023-11-07 DIAGNOSIS — Z5112 Encounter for antineoplastic immunotherapy: Secondary | ICD-10-CM | POA: Diagnosis not present

## 2023-11-07 DIAGNOSIS — I129 Hypertensive chronic kidney disease with stage 1 through stage 4 chronic kidney disease, or unspecified chronic kidney disease: Secondary | ICD-10-CM | POA: Diagnosis not present

## 2023-11-07 LAB — CMP (CANCER CENTER ONLY)
ALT: 12 U/L (ref 0–44)
AST: 13 U/L — ABNORMAL LOW (ref 15–41)
Albumin: 3.9 g/dL (ref 3.5–5.0)
Alkaline Phosphatase: 102 U/L (ref 38–126)
Anion gap: 5 (ref 5–15)
BUN: 12 mg/dL (ref 8–23)
CO2: 26 mmol/L (ref 22–32)
Calcium: 9.2 mg/dL (ref 8.9–10.3)
Chloride: 109 mmol/L (ref 98–111)
Creatinine: 1.51 mg/dL — ABNORMAL HIGH (ref 0.61–1.24)
GFR, Estimated: 48 mL/min — ABNORMAL LOW (ref >60)
Glucose, Bld: 198 mg/dL — ABNORMAL HIGH (ref 70–99)
Potassium: 3.8 mmol/L (ref 3.5–5.1)
Sodium: 140 mmol/L (ref 135–145)
Total Bilirubin: 0.7 mg/dL (ref 0.0–1.2)
Total Protein: 6.2 g/dL — ABNORMAL LOW (ref 6.5–8.1)

## 2023-11-07 LAB — CBC WITH DIFFERENTIAL (CANCER CENTER ONLY)
Abs Immature Granulocytes: 0.01 10*3/uL (ref 0.00–0.07)
Basophils Absolute: 0.1 10*3/uL (ref 0.0–0.1)
Basophils Relative: 2 %
Eosinophils Absolute: 0.1 10*3/uL (ref 0.0–0.5)
Eosinophils Relative: 5 %
HCT: 30.1 % — ABNORMAL LOW (ref 39.0–52.0)
Hemoglobin: 10.4 g/dL — ABNORMAL LOW (ref 13.0–17.0)
Immature Granulocytes: 0 %
Lymphocytes Relative: 37 %
Lymphs Abs: 0.9 10*3/uL (ref 0.7–4.0)
MCH: 33.2 pg (ref 26.0–34.0)
MCHC: 34.6 g/dL (ref 30.0–36.0)
MCV: 96.2 fL (ref 80.0–100.0)
Monocytes Absolute: 0.3 10*3/uL (ref 0.1–1.0)
Monocytes Relative: 14 %
Neutro Abs: 1 10*3/uL — ABNORMAL LOW (ref 1.7–7.7)
Neutrophils Relative %: 42 %
Platelet Count: 223 10*3/uL (ref 150–400)
RBC: 3.13 MIL/uL — ABNORMAL LOW (ref 4.22–5.81)
RDW: 14.7 % (ref 11.5–15.5)
WBC Count: 2.5 10*3/uL — ABNORMAL LOW (ref 4.0–10.5)
nRBC: 0 % (ref 0.0–0.2)

## 2023-11-07 MED ORDER — DARATUMUMAB-HYALURONIDASE-FIHJ 1800-30000 MG-UT/15ML ~~LOC~~ SOLN
1800.0000 mg | Freq: Once | SUBCUTANEOUS | Status: AC
  Administered 2023-11-07: 1800 mg via SUBCUTANEOUS
  Filled 2023-11-07: qty 15

## 2023-11-07 MED ORDER — DEXAMETHASONE 4 MG PO TABS
20.0000 mg | ORAL_TABLET | Freq: Once | ORAL | Status: AC
  Administered 2023-11-07: 20 mg via ORAL
  Filled 2023-11-07: qty 5

## 2023-11-07 MED ORDER — DIPHENHYDRAMINE HCL 25 MG PO CAPS
50.0000 mg | ORAL_CAPSULE | Freq: Once | ORAL | Status: AC
  Administered 2023-11-07: 50 mg via ORAL
  Filled 2023-11-07: qty 2

## 2023-11-07 MED ORDER — FAMOTIDINE 20 MG PO TABS
20.0000 mg | ORAL_TABLET | Freq: Once | ORAL | Status: AC
  Administered 2023-11-07: 20 mg via ORAL
  Filled 2023-11-07: qty 1

## 2023-11-07 MED ORDER — ACETAMINOPHEN 325 MG PO TABS
650.0000 mg | ORAL_TABLET | Freq: Once | ORAL | Status: AC
  Administered 2023-11-07: 650 mg via ORAL
  Filled 2023-11-07: qty 2

## 2023-11-07 NOTE — Progress Notes (Signed)
 HEMATOLOGY/ONCOLOGY CLINIC NOTE  Date of Service: 11/07/23   Patient Care Team: Lawrance Presume, MD as PCP - General (Internal Medicine)  CHIEF COMPLAINTS/PURPOSE OF CONSULTATION:  Follow-up for continued evaluation and management of relapsed multiple myeloma  Oncologic History:  78 y.o. male with diagnosis of IgA lambda active multiple myeloma, ISS Stage I. Active disease diagnosed based on presence of anemia, kidney insufficiency, and paraproteinemia with significant predominance of lambda light chains as well as significant elevation of IgA. Bone marrow biopsy confirmed presence of atypical monoclonal plasma cell process in the bone marrow comprising at least 7% of the cellularity. Based on the findings, patient was started on treatment with lenalidomide , bortezomib , and low-dose dexamethasone  based on the anticipated tolerance by the patient. Lenalidomide  was started at 10 mg daily based on creatinine clearance of 42, dexamethasone  dose was reduced to 20 mg weekly based on patient's age. Treatment was complicated by rapid development of cutaneous rash attributed to lenalidomide , inaddition to decreased renal function and now patient has persistent severe anemia. Side-effects were attributed to Lenalidomide  and lenalidomide  was discontinued with subsequent recovery.  Patient has been receiving bortezomib  weekly with low-dose dexamethasone  in 4-day cycles.   Patient has completed induction systemic therapy for his disease with repeat bone marrow biopsy confirming complete response including no evidence of minimal residual disease by cytogenetics or FISH.  HISTORY OF PRESENTING ILLNESS:  Please see previous note for details of initial presentation.  [name] is a 78 y.o. male is here for continued evaluation and management of his relapsed multiple myeloma. Patient was last seen by me on 10/10/2023 and reported occasional diarrhea.  Today, he presents with  a cane. Patient reports that he has been doing well overall since his last clinical visit.   Patient has been tolerating his treatment well with no toxicity issus.   He denies any need for medication refills.   Patient denies any infection issues.  He reports that he has been eating well.   His neuropathy in his feet has been stable. He complains of feeling off-balance constantly and he reports that use of cane improves symptoms.   He denies any travel plans at this time.   MEDICAL HISTORY:  Past Medical History:  Diagnosis Date   Anemia    Arthritis    shoulders,feet    Cataract    per eye dr -has appt 5-11   CKD (chronic kidney disease), stage III (HCC)    Elevated PSA    History of ketoacidosis    03-29-2015    History of sepsis    07-25-2013  non-compliant w/ medication   Hyperlipidemia    Hypertension    Nocturia    Peripheral neuropathy    Prostate cancer (HCC)    Type 2 diabetes mellitus with insulin  therapy [facility])     SURGICAL HISTORY: Past Surgical History:  Procedure Laterality Date   CIRCUMCISION  1990's   PROSTATE BIOPSY N/A 12/21/2015   Procedure: BIOPSY TRANSRECTAL ULTRASONIC PROSTATE (TUBP);  Surgeon: Christina Coyer, MD;  [facility];  Service: Urology;  Laterality: N/A;    SOCIAL HISTORY: Social History   Socioeconomic History   Marital status: Single    Spouse name: Not on file   Number of children: Not on file   Years of education: Not on file   Highest education level: Not on file  Occupational History   Not on file  Tobacco Use   Smoking status:  Every Day    Current packs/day: 0.33    Average packs/day: 0.3 packs/day for 50.0 years (16.5 ttl pk-yrs)    Types: Cigarettes   Smokeless tobacco: Never   Tobacco comments:    1 ppwk-4-5 a day   Vaping Use   Vaping status: Never Used  Substance and Sexual Activity   Alcohol use: Yes    Alcohol/week: 1.0 standard drink of alcohol    Types: 1 Cans of beer per week     Comment: a beer every now and again   Drug use: No   Sexual activity: Not Currently  Other Topics Concern   Not on file  Social History Narrative   Not on file   Social Drivers of Health   Financial Resource Strain: Low Risk  (05/11/2023)   Overall Financial Resource Strain (CARDIA)    Difficulty of Paying Living Expenses: Not hard at all  Food Insecurity: No Food Insecurity (05/11/2023)   Hunger Vital Sign    Worried About Running Out of Food in the Last Year: Never true    Ran Out of Food in the Last Year: Never true  Transportation Needs: No Transportation Needs (05/11/2023)   PRAPARE - Administrator, Civil Service (Medical): No    Lack of Transportation (Non-Medical): No  Physical Activity: Inactive (05/11/2023)   Exercise Vital Sign    Days of Exercise per Week: 0 days    Minutes of Exercise per Session: 0 min  Stress: No Stress Concern Present (05/11/2023)   [employer] of Occupational Health - Occupational Stress Questionnaire    Feeling of Stress : Not at all  Social Connections: Moderately Isolated (05/11/2023)   Social Connection and Isolation Panel [NHANES]    Frequency of Communication with Friends and Family: Three times a week    Frequency of Social Gatherings with Friends and Family: Twice a week    Attends Religious Services: Never    Database administrator or Organizations: Yes    Attends Engineer, structural: More than 4 times per year    Marital Status: Never married  Intimate Partner Violence: Not At Risk (05/11/2023)   Humiliation, Afraid, Rape, and Kick questionnaire    Fear of Current or Ex-Partner: No    Emotionally Abused: No    Physically Abused: No    Sexually Abused: No    FAMILY HISTORY: Family History  Problem Relation Age of Onset   Kidney disease Mother    Cancer Neg Hx    Colon cancer Neg Hx    Colon polyps Neg Hx    Esophageal cancer Neg Hx    Rectal cancer Neg Hx    Stomach cancer Neg Hx     ALLERGIES:   is allergic to other and lisinopril .  MEDICATIONS:  Current Outpatient Medications  Medication Sig Dispense Refill   Accu-Chek Softclix Lancets lancets Use to test blood sugar 3 times daily. 100 each 3   acetaminophen  (TYLENOL ) 500 MG tablet Take 2 tablets (1,000 mg total) by mouth every 6 (six) hours as needed. 30 tablet 0   amLODipine  (NORVASC ) 10 MG tablet Take 1 tablet (10 mg total) by mouth daily. (Patient taking differently: Take 10 mg by mouth daily. Take 1/2 tablet) 90 tablet 1   aspirin  EC 81 MG tablet Take 81 mg by mouth daily. Swallow whole.     b complex vitamins capsule Take 1 capsule by mouth daily. 30 capsule 5   Blood Glucose Monitoring Suppl (ACCU-CHEK GUIDE) w/Device  KIT Use to test blood sugar three times daily 1 kit 0   buPROPion  (WELLBUTRIN ) 75 MG tablet Take 1 tablet (75 mg total) by mouth 2 (two) times daily. To reduce urge of smoking. 60 tablet 3   cetirizine  (ZYRTEC ) 5 MG tablet Take 1 tablet (5 mg total) by mouth at bedtime. 30 tablet 1   Continuous Blood Gluc Receiver (FREESTYLE LIBRE READER) DEVI Use to check blood sugar 3x daily. E11.42 1 each 0   Continuous Blood Gluc Sensor (FREESTYLE LIBRE SENSOR SYSTEM) MISC Use to check blood sugar 3 times daily. Change sensor every 2 weeks. 2 each 12   donepezil  (ARICEPT ) 5 MG tablet TAKE 1 TABLET (5 MG TOTAL) BY MOUTH AT BEDTIME. FOR MEMORY ISSUES (Patient not taking: Reported on 10/10/2023) 30 tablet 5   fluticasone  (FLONASE ) 50 MCG/ACT nasal spray Place 2 sprays into both nostrils daily. 16 g 2   furosemide  (LASIX ) 20 MG tablet Take 1 tablet (20 mg total) by mouth daily in the morning for 7 days (until 09/08/22). Save remaining tablets. 30 tablet 0   gabapentin  (NEURONTIN ) 300 MG capsule Take 1 capsule (300 mg total) by mouth every morning AND 1 capsule (300 mg total) daily at 12 noon AND 2 capsules (600 mg total) at bedtime. Stop Lyrica . 120 capsule 11   glucose blood (ACCU-CHEK GUIDE TEST) test strip Use to test blood sugar 3  times daily. 100 each 2   Insulin  Lispro Prot & Lispro (HUMALOG  MIX 75/25 KWIKPEN) (75-25) 100 UNIT/ML Kwikpen Inject 9 units subcutaneously with breakfast and dinner. Increase to 12 units for 3-5 days twice a month after dexamethasone /decadron . 15 mL 11   Insulin  Pen Needle (INSUPEN PEN NEEDLES) 32G X 4 MM MISC USE AS DIRECTED TWICE DAILY WITH INSULIN  100 each 1   Multiple Vitamin (MULTIVITAMIN WITH MINERALS) TABS tablet Take 1 tablet by mouth daily. 60 tablet 2   ondansetron  (ZOFRAN ) 8 MG tablet Take 1 tablet (8 mg total) by mouth 2 (two) times daily as needed (Nausea or vomiting). 30 tablet 1   pomalidomide  (POMALYST ) 2 MG capsule Take 1 capsule (2 mg total) by mouth daily. 3 weeks on 1 week off 21 capsule 0   potassium chloride  SA (KLOR-CON  M) 20 MEQ tablet Take 1 tablet (20 mEq total) by mouth daily. 30 tablet 1   pravastatin  (PRAVACHOL ) 20 MG tablet Take 1 tablet (20 mg total) by mouth daily. 90 tablet 1   prochlorperazine  (COMPAZINE ) 10 MG tablet Take 1 tablet (10 mg total) by mouth every 6 (six) hours as needed (Nausea or vomiting). 30 tablet 1   tiZANidine  (ZANAFLEX ) 4 MG tablet Take 1 tablet (4 mg total) by mouth every 8 (eight) hours as needed for muscle spasms. 90 tablet 5   valsartan  (DIOVAN ) 40 MG tablet Take 1 tablet (40 mg total) by mouth daily. STOP LISINOPRIL  90 tablet 3   valsartan  (DIOVAN ) 40 MG tablet Take 1 tablet (40 mg total) by mouth daily. STOP LISINOPRIL      No current facility-administered medications for this visit.    REVIEW OF SYSTEMS:  10 Point review of Systems was done is negative except as noted above.   PHYSICAL EXAMINATION: .There were no vitals taken for this visit.   GENERAL:alert, in no acute distress and comfortable SKIN: no acute rashes, no significant lesions EYES: conjunctiva are pink and non-injected, sclera anicteric OROPHARYNX: MMM, no exudates, no oropharyngeal erythema or ulceration NECK: supple, no JVD LYMPH:  no palpable lymphadenopathy  in the cervical, axillary  or inguinal regions LUNGS: clear to auscultation b/l with normal respiratory effort HEART: regular rate & rhythm ABDOMEN:  normoactive bowel sounds , non tender, not distended. Extremity: no pedal edema PSYCH: alert & oriented x 3 with fluent speech NEURO: no focal motor/sensory deficits   LABORATORY DATA:  I have reviewed the data as listed  .    Latest Ref Rng & Units 10/10/2023    9:13 AM 09/26/2023    1:03 PM 08/01/2023    7:43 AM  CBC  WBC 4.0 - 10.5 K/uL 4.4  5.3  3.7   Hemoglobin 13.0 - 17.0 g/dL 9.7  9.7  9.2   Hematocrit 39.0 - 52.0 % 29.0  28.9  27.6   Platelets 150 - 400 K/uL 277  195  238       Latest Ref Rng & Units 10/10/2023    9:13 AM 09/26/2023    1:03 PM 08/01/2023    7:43 AM  CMP  Glucose 70 - 99 mg/dL 846  962  952   BUN 8 - 23 mg/dL 16  14  14    Creatinine 0.61 - 1.24 mg/dL 8.41  3.24  4.01   Sodium 135 - 145 mmol/L 139  137  138   Potassium 3.5 - 5.1 mmol/L 4.3  4.2  3.4   Chloride 98 - 111 mmol/L 107  106  106   CO2 22 - 32 mmol/L 27  25  26    Calcium  8.9 - 10.3 mg/dL 9.2  8.9  9.2   Total Protein 6.5 - 8.1 g/dL 6.1  6.0  6.3   Total Bilirubin 0.0 - 1.2 mg/dL 0.6  0.5  0.7   Alkaline Phos 38 - 126 U/L 68  51  52   AST 15 - 41 U/L 13  11  15    ALT 0 - 44 U/L 13  10  15       08/29/17 BM Bx:    08/29/17 Cytogenetics:   03/13/17 Cytogenetics:        PATHOLOGY Surgical Pathology  CASE: [id number]  PATIENT: Robert Sparks  Bone Marrow Report      Clinical History: Multiple myeloma in relapse (HCC) ,(BH)      DIAGNOSIS:   BONE MARROW, ASPIRATE, CLOT, CORE:  -Hypercellular bone marrow for age with plasma cell neoplasm  -See comment   PERIPHERAL BLOOD:  -Mild normocytic-normochromic anemia  -Eosinophilia   COMMENT:   The bone marrow is hypercellular for age with increased number of plasma  cells representing 8% of all cells in the aspirate associated with  numerous small clusters in the clot and  biopsy sections. The plasma  cells display lambda light chain restriction consistent with plasma cell  neoplasm.  The background shows trilineage hematopoiesis with generally  nonspecific myeloid changes, likely secondary in nature in this setting.  Correlation with cytogenetic and FISH studies is recommended.      RADIOGRAPHIC STUDIES: I have personally reviewed the radiological images as listed and agreed with the findings in the report. No results found.  ASSESSMENT & PLAN:   78 y.o. male with   1.  Relapsed IgA Lambda Multiple Myeloma ISS Stage I    Active disease was previously diagnosed based on presence of anemia, kidney insufficiency, and paraproteinemia with significant predominance of lambda light chains as well as significant elevation of IgA. Patient is status post patient was treated with induction bortezomib  Revlimid  dexamethasone .  Had issues with skin rashes with Revlimid  and this was discontinued.  Completed bortezomib   dexamethasone  induction and achieved complete remission. Was on maintenance Velcade  2 weeks which was then switched to maintenance Ninlaro . Patient had issues with worsening neuropathy which was a combination of Velcade  and his diabetes.  Ninlaro  was discontinued as well after maintenance of more than 2 years.  2.  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 Bone marrow biopsy shows 8% lambda restricted plasma cells PET CT scan with no hypermetabolic osseous lesions  3.  Hypertension 4.  Diabetes type 2 5 .chronic kidney disease due to hypertension and diabetes. 6.  Peripheral neuropathy related to diabetes and previous myeloma treatments.  PLAN:  -Discussed lab results on 11/07/23 in detail with patient. CBC stable, showed WBC of 2.5K, hemoglobin of 10.4, and platelets of 223K. -CMP stable -last myeloma panel from 1 month ago showed that his M protein was  undetectable  -no clinical sign or lab evidence of myeloma progression at this time  -patient has no major toxicities from Daratumumab  or Pomalidomide .  -continue current treatment regimen: -continue daratumumab  every two weeks -continue Pomalidomide  as prescribed -discussed that we will continue his current treatment as long as it works, as long as he can tolerate it, and as long as he chooses to continue it -answered all of patient's questions in detail  FOLLOW-UP: Per integrated scheduling MD visit in 8 weeks  The total time spent in the appointment was *** minutes* .  All of the patient's questions were answered with apparent satisfaction. The patient knows to call the clinic with any problems, questions or concerns.   Jacquelyn Matt MD MS AAHIVMS [facility] Hematology/Oncology Physician [facility]  .*Total Encounter Time as defined by the Centers for Medicare and Medicaid Services includes, in addition to the face-to-face time of a patient visit (documented in the note above) non-face-to-face time: obtaining and reviewing outside history, ordering and reviewing medications, tests or procedures, care coordination (communications with other health care professionals or caregivers) and documentation in the medical record.    I,Mitra Faeizi,acting as a Neurosurgeon for Jacquelyn Matt, MD.,have documented all relevant documentation on the behalf of Jacquelyn Matt, MD,as directed by  Jacquelyn Matt, MD while in the presence of Jacquelyn Matt, MD.  ***

## 2023-11-07 NOTE — Progress Notes (Signed)
 Patient seen by Dr. Minnie Amber are within treatment parameters.  Labs reviewed: and are not all within treatment parameters.   Dr Salomon Cree aware: ANC: 1.0, CR: 1.51  Per physician team, patient is ready for treatment and there are NO modifications to the treatment plan.

## 2023-11-07 NOTE — Patient Instructions (Signed)
 [facility] - A DEPT OF [facility]  Discharge Instructions: Thank you for choosing [facility] to provide your oncology and hematology care.   If you have a lab appointment with the Cancer Center, please go directly to the Cancer Center and check in at the registration area.   Wear comfortable clothing and clothing appropriate for easy access to any Portacath or PICC line.   We strive to give you quality time with your provider. You may need to reschedule your appointment if you arrive late (15 or more minutes).  Arriving late affects you and other patients whose appointments are after yours.  Also, if you miss three or more appointments without notifying the office, you may be dismissed from the clinic at the provider's discretion.      For prescription refill requests, have your pharmacy contact our office and allow 72 hours for refills to be completed.    Today you received the following chemotherapy and/or immunotherapy agents: Darzalex  Fapsro      To help prevent nausea and vomiting after your treatment, we encourage you to take your nausea medication as directed.  BELOW ARE SYMPTOMS THAT SHOULD BE REPORTED IMMEDIATELY: *FEVER GREATER THAN 100.4 F (38 C) OR HIGHER *CHILLS OR SWEATING *NAUSEA AND VOMITING THAT IS NOT CONTROLLED WITH YOUR NAUSEA MEDICATION *UNUSUAL SHORTNESS OF BREATH *UNUSUAL BRUISING OR BLEEDING *URINARY PROBLEMS (pain or burning when urinating, or frequent urination) *BOWEL PROBLEMS (unusual diarrhea, constipation, pain near the anus) TENDERNESS IN MOUTH AND THROAT WITH OR WITHOUT PRESENCE OF ULCERS (sore throat, sores in mouth, or a toothache) UNUSUAL RASH, SWELLING OR PAIN  UNUSUAL VAGINAL DISCHARGE OR ITCHING   Items with * indicate a potential emergency and should be followed up as soon as possible or go to the Emergency Department if any problems should occur.  Please show the CHEMOTHERAPY ALERT CARD or  IMMUNOTHERAPY ALERT CARD at check-in to the Emergency Department and triage nurse.  Should you have questions after your visit or need to cancel or reschedule your appointment, please contact [facility] - A DEPT OF [facility]  Dept: [phone]  and follow the prompts.  Office hours are 8:00 a.m. to 4:30 p.m. Monday - Friday. Please note that voicemails left after 4:00 p.m. may not be returned until the following business day.  We are closed weekends and major holidays. You have access to a nurse at all times for urgent questions. Please call the main number to the clinic Dept: [phone] and follow the prompts.   For any non-urgent questions, you may also contact your provider using MyChart. We now offer e-Visits for anyone 20 and older to request care online for non-urgent symptoms. For details visit [email or web address].   Also download the MyChart app! Go to the app store, search "MyChart", open the app, select [place], and log in with your MyChart username and password.

## 2023-11-08 ENCOUNTER — Other Ambulatory Visit: Payer: Self-pay | Admitting: Internal Medicine

## 2023-11-08 ENCOUNTER — Other Ambulatory Visit: Payer: Self-pay

## 2023-11-08 DIAGNOSIS — E1142 Type 2 diabetes mellitus with diabetic polyneuropathy: Secondary | ICD-10-CM

## 2023-11-08 LAB — KAPPA/LAMBDA LIGHT CHAINS
Kappa free light chain: 58.1 mg/L — ABNORMAL HIGH (ref 3.3–19.4)
Kappa, lambda light chain ratio: 1.93 — ABNORMAL HIGH (ref 0.26–1.65)
Lambda free light chains: 30.1 mg/L — ABNORMAL HIGH (ref 5.7–26.3)

## 2023-11-08 MED ORDER — ACCU-CHEK GUIDE TEST VI STRP
ORAL_STRIP | 2 refills | Status: DC
Start: 2023-11-08 — End: 2024-01-28
  Filled 2023-11-08: qty 100, fill #0
  Filled 2023-11-12: qty 100, 33d supply, fill #0
  Filled 2023-12-06 – 2023-12-10 (×3): qty 100, 33d supply, fill #1
  Filled 2024-01-04: qty 100, 33d supply, fill #2

## 2023-11-11 LAB — MULTIPLE MYELOMA PANEL, SERUM
Albumin SerPl Elph-Mcnc: 3.6 g/dL (ref 2.9–4.4)
Albumin/Glob SerPl: 1.8 — ABNORMAL HIGH (ref 0.7–1.7)
Alpha 1: 0.2 g/dL (ref 0.0–0.4)
Alpha2 Glob SerPl Elph-Mcnc: 0.7 g/dL (ref 0.4–1.0)
B-Globulin SerPl Elph-Mcnc: 0.8 g/dL (ref 0.7–1.3)
Gamma Glob SerPl Elph-Mcnc: 0.4 g/dL (ref 0.4–1.8)
Globulin, Total: 2.1 g/dL — ABNORMAL LOW (ref 2.2–3.9)
IgA: 143 mg/dL (ref 61–437)
IgG (Immunoglobin G), Serum: 630 mg/dL (ref 603–1613)
IgM (Immunoglobulin M), Srm: 31 mg/dL (ref 15–143)
Total Protein ELP: 5.7 g/dL — ABNORMAL LOW (ref 6.0–8.5)

## 2023-11-12 ENCOUNTER — Other Ambulatory Visit: Payer: Self-pay

## 2023-11-13 ENCOUNTER — Encounter: Payer: Self-pay | Admitting: Hematology

## 2023-11-19 ENCOUNTER — Other Ambulatory Visit: Payer: Self-pay

## 2023-11-27 ENCOUNTER — Other Ambulatory Visit: Payer: Self-pay

## 2023-11-29 ENCOUNTER — Other Ambulatory Visit: Payer: Self-pay

## 2023-11-29 DIAGNOSIS — C9002 Multiple myeloma in relapse: Secondary | ICD-10-CM

## 2023-11-29 MED ORDER — POMALIDOMIDE 2 MG PO CAPS: 2.0000 mg | ORAL_CAPSULE | Freq: Every day | ORAL | 0 refills | Status: DC

## 2023-11-30 ENCOUNTER — Other Ambulatory Visit: Payer: Self-pay | Admitting: Internal Medicine

## 2023-11-30 ENCOUNTER — Other Ambulatory Visit: Payer: Self-pay

## 2023-12-04 ENCOUNTER — Other Ambulatory Visit: Payer: Self-pay

## 2023-12-05 ENCOUNTER — Inpatient Hospital Stay: Admitting: Hematology

## 2023-12-05 ENCOUNTER — Inpatient Hospital Stay: Attending: Hematology

## 2023-12-05 ENCOUNTER — Inpatient Hospital Stay

## 2023-12-05 VITALS — BP 148/71 | HR 70 | Temp 97.5°F

## 2023-12-05 DIAGNOSIS — C9002 Multiple myeloma in relapse: Secondary | ICD-10-CM | POA: Diagnosis not present

## 2023-12-05 DIAGNOSIS — Z5112 Encounter for antineoplastic immunotherapy: Secondary | ICD-10-CM | POA: Diagnosis not present

## 2023-12-05 DIAGNOSIS — Z7189 Other specified counseling: Secondary | ICD-10-CM

## 2023-12-05 LAB — CMP (CANCER CENTER ONLY)
ALT: 8 U/L (ref 0–44)
AST: 11 U/L — ABNORMAL LOW (ref 15–41)
Albumin: 3.8 g/dL (ref 3.5–5.0)
Alkaline Phosphatase: 43 U/L (ref 38–126)
Anion gap: 8 (ref 5–15)
BUN: 11 mg/dL (ref 8–23)
CO2: 26 mmol/L (ref 22–32)
Calcium: 9.1 mg/dL (ref 8.9–10.3)
Chloride: 104 mmol/L (ref 98–111)
Creatinine: 1.31 mg/dL — ABNORMAL HIGH (ref 0.61–1.24)
GFR, Estimated: 57 mL/min — ABNORMAL LOW (ref >60)
Glucose, Bld: 170 mg/dL — ABNORMAL HIGH (ref 70–99)
Potassium: 3.4 mmol/L — ABNORMAL LOW (ref 3.5–5.1)
Sodium: 138 mmol/L (ref 135–145)
Total Bilirubin: 0.8 mg/dL (ref 0.0–1.2)
Total Protein: 6.1 g/dL — ABNORMAL LOW (ref 6.5–8.1)

## 2023-12-05 LAB — CBC WITH DIFFERENTIAL (CANCER CENTER ONLY)
Abs Immature Granulocytes: 0 10*3/uL (ref 0.00–0.07)
Basophils Absolute: 0 10*3/uL (ref 0.0–0.1)
Basophils Relative: 2 %
Eosinophils Absolute: 0.2 10*3/uL (ref 0.0–0.5)
Eosinophils Relative: 7 %
HCT: 27.9 % — ABNORMAL LOW (ref 39.0–52.0)
Hemoglobin: 9.4 g/dL — ABNORMAL LOW (ref 13.0–17.0)
Immature Granulocytes: 0 %
Lymphocytes Relative: 37 %
Lymphs Abs: 0.9 10*3/uL (ref 0.7–4.0)
MCH: 32.1 pg (ref 26.0–34.0)
MCHC: 33.7 g/dL (ref 30.0–36.0)
MCV: 95.2 fL (ref 80.0–100.0)
Monocytes Absolute: 0.3 10*3/uL (ref 0.1–1.0)
Monocytes Relative: 12 %
Neutro Abs: 1.1 10*3/uL — ABNORMAL LOW (ref 1.7–7.7)
Neutrophils Relative %: 42 %
Platelet Count: 224 10*3/uL (ref 150–400)
RBC: 2.93 MIL/uL — ABNORMAL LOW (ref 4.22–5.81)
RDW: 14.6 % (ref 11.5–15.5)
Smear Review: NORMAL
WBC Count: 2.5 10*3/uL — ABNORMAL LOW (ref 4.0–10.5)
nRBC: 0 % (ref 0.0–0.2)

## 2023-12-05 MED ORDER — DIPHENHYDRAMINE HCL 25 MG PO CAPS
50.0000 mg | ORAL_CAPSULE | Freq: Once | ORAL | Status: AC
  Administered 2023-12-05: 50 mg via ORAL
  Filled 2023-12-05: qty 2

## 2023-12-05 MED ORDER — DARATUMUMAB-HYALURONIDASE-FIHJ 1800-30000 MG-UT/15ML ~~LOC~~ SOLN
1800.0000 mg | Freq: Once | SUBCUTANEOUS | Status: AC
  Administered 2023-12-05: 1800 mg via SUBCUTANEOUS
  Filled 2023-12-05: qty 15

## 2023-12-05 MED ORDER — FAMOTIDINE 20 MG PO TABS
20.0000 mg | ORAL_TABLET | Freq: Once | ORAL | Status: AC
  Administered 2023-12-05: 20 mg via ORAL
  Filled 2023-12-05: qty 1

## 2023-12-05 MED ORDER — ACETAMINOPHEN 325 MG PO TABS
650.0000 mg | ORAL_TABLET | Freq: Once | ORAL | Status: AC
  Administered 2023-12-05: 650 mg via ORAL
  Filled 2023-12-05: qty 2

## 2023-12-05 MED ORDER — DEXAMETHASONE 4 MG PO TABS
20.0000 mg | ORAL_TABLET | Freq: Once | ORAL | Status: AC
  Administered 2023-12-05: 20 mg via ORAL
  Filled 2023-12-05: qty 5

## 2023-12-05 NOTE — Patient Instructions (Signed)
 [facility] - A DEPT OF [facility]  Discharge Instructions: Thank you for choosing [facility] to provide your oncology and hematology care.   If you have a lab appointment with the Cancer Center, please go directly to the Cancer Center and check in at the registration area.   Wear comfortable clothing and clothing appropriate for easy access to any Portacath or PICC line.   We strive to give you quality time with your provider. You may need to reschedule your appointment if you arrive late (15 or more minutes).  Arriving late affects you and other patients whose appointments are after yours.  Also, if you miss three or more appointments without notifying the office, you may be dismissed from the clinic at the provider's discretion.      For prescription refill requests, have your pharmacy contact our office and allow 72 hours for refills to be completed.    Today you received the following chemotherapy and/or immunotherapy agents Darzalex faspro      To help prevent nausea and vomiting after your treatment, we encourage you to take your nausea medication as directed.  BELOW ARE SYMPTOMS THAT SHOULD BE REPORTED IMMEDIATELY: *FEVER GREATER THAN 100.4 F (38 C) OR HIGHER *CHILLS OR SWEATING *NAUSEA AND VOMITING THAT IS NOT CONTROLLED WITH YOUR NAUSEA MEDICATION *UNUSUAL SHORTNESS OF BREATH *UNUSUAL BRUISING OR BLEEDING *URINARY PROBLEMS (pain or burning when urinating, or frequent urination) *BOWEL PROBLEMS (unusual diarrhea, constipation, pain near the anus) TENDERNESS IN MOUTH AND THROAT WITH OR WITHOUT PRESENCE OF ULCERS (sore throat, sores in mouth, or a toothache) UNUSUAL RASH, SWELLING OR PAIN  UNUSUAL VAGINAL DISCHARGE OR ITCHING   Items with * indicate a potential emergency and should be followed up as soon as possible or go to the Emergency Department if any problems should occur.  Please show the CHEMOTHERAPY ALERT CARD or  IMMUNOTHERAPY ALERT CARD at check-in to the Emergency Department and triage nurse.  Should you have questions after your visit or need to cancel or reschedule your appointment, please contact [facility] - A DEPT OF [facility]  Dept: [phone]  and follow the prompts.  Office hours are 8:00 a.m. to 4:30 p.m. Monday - Friday. Please note that voicemails left after 4:00 p.m. may not be returned until the following business day.  We are closed weekends and major holidays. You have access to a nurse at all times for urgent questions. Please call the main number to the clinic Dept: [phone] and follow the prompts.   For any non-urgent questions, you may also contact your provider using MyChart. We now offer e-Visits for anyone 40 and older to request care online for non-urgent symptoms. For details visit [email or web address].   Also download the MyChart app! Go to the app store, search "MyChart", open the app, select [place], and log in with your MyChart username and password.

## 2023-12-06 ENCOUNTER — Other Ambulatory Visit: Payer: Self-pay

## 2023-12-06 LAB — KAPPA/LAMBDA LIGHT CHAINS
Kappa free light chain: 48.8 mg/L — ABNORMAL HIGH (ref 3.3–19.4)
Kappa, lambda light chain ratio: 1.61 (ref 0.26–1.65)
Lambda free light chains: 30.3 mg/L — ABNORMAL HIGH (ref 5.7–26.3)

## 2023-12-07 LAB — MULTIPLE MYELOMA PANEL, SERUM
Albumin SerPl Elph-Mcnc: 3.2 g/dL (ref 2.9–4.4)
Albumin/Glob SerPl: 1.6 (ref 0.7–1.7)
Alpha 1: 0.2 g/dL (ref 0.0–0.4)
Alpha2 Glob SerPl Elph-Mcnc: 0.7 g/dL (ref 0.4–1.0)
B-Globulin SerPl Elph-Mcnc: 0.8 g/dL (ref 0.7–1.3)
Gamma Glob SerPl Elph-Mcnc: 0.5 g/dL (ref 0.4–1.8)
Globulin, Total: 2.1 g/dL — ABNORMAL LOW (ref 2.2–3.9)
IgA: 130 mg/dL (ref 61–437)
IgG (Immunoglobin G), Serum: 569 mg/dL — ABNORMAL LOW (ref 603–1613)
IgM (Immunoglobulin M), Srm: 28 mg/dL (ref 15–143)
Total Protein ELP: 5.3 g/dL — ABNORMAL LOW (ref 6.0–8.5)

## 2023-12-10 ENCOUNTER — Other Ambulatory Visit: Payer: Self-pay

## 2023-12-10 DIAGNOSIS — N1831 Chronic kidney disease, stage 3a: Secondary | ICD-10-CM | POA: Diagnosis not present

## 2023-12-11 DIAGNOSIS — R0789 Other chest pain: Secondary | ICD-10-CM | POA: Diagnosis not present

## 2023-12-11 DIAGNOSIS — R0602 Shortness of breath: Secondary | ICD-10-CM | POA: Diagnosis not present

## 2023-12-17 ENCOUNTER — Ambulatory Visit (INDEPENDENT_AMBULATORY_CARE_PROVIDER_SITE_OTHER): Admitting: Podiatry

## 2023-12-17 ENCOUNTER — Encounter: Payer: Self-pay | Admitting: Podiatry

## 2023-12-17 DIAGNOSIS — L84 Corns and callosities: Secondary | ICD-10-CM | POA: Diagnosis not present

## 2023-12-17 DIAGNOSIS — M79674 Pain in right toe(s): Secondary | ICD-10-CM

## 2023-12-17 DIAGNOSIS — M79675 Pain in left toe(s): Secondary | ICD-10-CM | POA: Diagnosis not present

## 2023-12-17 DIAGNOSIS — B351 Tinea unguium: Secondary | ICD-10-CM

## 2023-12-17 DIAGNOSIS — E1142 Type 2 diabetes mellitus with diabetic polyneuropathy: Secondary | ICD-10-CM

## 2023-12-17 NOTE — Progress Notes (Signed)
 This patient presents to the office with chief complaint of long thick painful nails.  Patient says the nails are painful walking and wearing shoes.  This patient is unable to self treat.  This patient is unable to trim his nails since he is unable to reach his nails.  She presents to the office for preventative foot care services.  General Appearance  Alert, conversant and in no acute stress.  Vascular  Dorsalis pedis and posterior tibial  pulses are palpable  bilaterally.  Capillary return is within normal limits  bilaterally. Temperature is within normal limits  bilaterally.  Neurologic  Senn-Weinstein monofilament wire test within normal limits  bilaterally. Muscle power within normal limits bilaterally.  Nails Thick disfigured discolored nails with subungual debris  from hallux to fifth toes bilaterally. No evidence of bacterial infection or drainage bilaterally.  Orthopedic  No limitations of motion  feet .  No crepitus or effusions noted.  No bony pathology or digital deformities noted.  Skin  normotropic skin with no porokeratosis noted bilaterally.  No signs of infections or ulcers noted.   Callus plantar right hallux.  Onychomycosis  Nails  B/L.  Pain in right toes  Pain in left toes  Callus right hallux.  Debridement of nails both feet followed trimming the nails with dremel tool.  Debride callus with # 15 blade and dremel tool.  Debride callus with # 15 blade.  RTC 10 weeks    Ruffin Cotton DPM

## 2023-12-18 ENCOUNTER — Other Ambulatory Visit: Payer: Self-pay

## 2023-12-18 ENCOUNTER — Encounter: Payer: Self-pay | Admitting: Hematology

## 2023-12-18 DIAGNOSIS — D631 Anemia in chronic kidney disease: Secondary | ICD-10-CM | POA: Diagnosis not present

## 2023-12-18 DIAGNOSIS — E1122 Type 2 diabetes mellitus with diabetic chronic kidney disease: Secondary | ICD-10-CM | POA: Diagnosis not present

## 2023-12-18 DIAGNOSIS — I129 Hypertensive chronic kidney disease with stage 1 through stage 4 chronic kidney disease, or unspecified chronic kidney disease: Secondary | ICD-10-CM | POA: Diagnosis not present

## 2023-12-18 DIAGNOSIS — N1831 Chronic kidney disease, stage 3a: Secondary | ICD-10-CM | POA: Diagnosis not present

## 2023-12-18 DIAGNOSIS — N2581 Secondary hyperparathyroidism of renal origin: Secondary | ICD-10-CM | POA: Diagnosis not present

## 2023-12-18 MED ORDER — VITAMIN D (ERGOCALCIFEROL) 1.25 MG (50000 UNIT) PO CAPS
50000.0000 [IU] | ORAL_CAPSULE | ORAL | 3 refills | Status: AC
Start: 2023-12-18 — End: ?
  Filled 2023-12-18: qty 12, 84d supply, fill #0
  Filled 2024-04-22: qty 12, 84d supply, fill #1

## 2023-12-19 ENCOUNTER — Other Ambulatory Visit: Payer: Self-pay

## 2023-12-28 ENCOUNTER — Other Ambulatory Visit: Payer: Self-pay

## 2023-12-28 DIAGNOSIS — C9002 Multiple myeloma in relapse: Secondary | ICD-10-CM

## 2023-12-28 MED ORDER — POMALIDOMIDE 2 MG PO CAPS
2.0000 mg | ORAL_CAPSULE | Freq: Every day | ORAL | 0 refills | Status: DC
Start: 2023-12-28 — End: 2024-01-24

## 2024-01-01 ENCOUNTER — Other Ambulatory Visit: Payer: Self-pay

## 2024-01-01 DIAGNOSIS — C9002 Multiple myeloma in relapse: Secondary | ICD-10-CM

## 2024-01-02 ENCOUNTER — Inpatient Hospital Stay (HOSPITAL_BASED_OUTPATIENT_CLINIC_OR_DEPARTMENT_OTHER): Admitting: Hematology

## 2024-01-02 ENCOUNTER — Other Ambulatory Visit: Payer: Self-pay

## 2024-01-02 ENCOUNTER — Inpatient Hospital Stay

## 2024-01-02 ENCOUNTER — Inpatient Hospital Stay: Attending: Hematology

## 2024-01-02 ENCOUNTER — Other Ambulatory Visit: Payer: Self-pay | Admitting: Hematology

## 2024-01-02 VITALS — BP 145/68 | HR 73 | Temp 97.5°F | Resp 16 | Wt 161.8 lb

## 2024-01-02 DIAGNOSIS — C9002 Multiple myeloma in relapse: Secondary | ICD-10-CM | POA: Diagnosis not present

## 2024-01-02 DIAGNOSIS — Z5112 Encounter for antineoplastic immunotherapy: Secondary | ICD-10-CM | POA: Diagnosis not present

## 2024-01-02 DIAGNOSIS — Z7189 Other specified counseling: Secondary | ICD-10-CM

## 2024-01-02 DIAGNOSIS — Z5111 Encounter for antineoplastic chemotherapy: Secondary | ICD-10-CM | POA: Diagnosis not present

## 2024-01-02 LAB — CBC WITH DIFFERENTIAL (CANCER CENTER ONLY)
Abs Immature Granulocytes: 0.01 10*3/uL (ref 0.00–0.07)
Basophils Absolute: 0.1 10*3/uL (ref 0.0–0.1)
Basophils Relative: 2 %
Eosinophils Absolute: 0.5 10*3/uL (ref 0.0–0.5)
Eosinophils Relative: 13 %
HCT: 24.2 % — ABNORMAL LOW (ref 39.0–52.0)
Hemoglobin: 8.1 g/dL — ABNORMAL LOW (ref 13.0–17.0)
Immature Granulocytes: 0 %
Lymphocytes Relative: 29 %
Lymphs Abs: 1.1 10*3/uL (ref 0.7–4.0)
MCH: 31.4 pg (ref 26.0–34.0)
MCHC: 33.5 g/dL (ref 30.0–36.0)
MCV: 93.8 fL (ref 80.0–100.0)
Monocytes Absolute: 0.4 10*3/uL (ref 0.1–1.0)
Monocytes Relative: 10 %
Neutro Abs: 1.8 10*3/uL (ref 1.7–7.7)
Neutrophils Relative %: 46 %
Platelet Count: 269 10*3/uL (ref 150–400)
RBC: 2.58 MIL/uL — ABNORMAL LOW (ref 4.22–5.81)
RDW: 15 % (ref 11.5–15.5)
WBC Count: 3.9 10*3/uL — ABNORMAL LOW (ref 4.0–10.5)
nRBC: 0 % (ref 0.0–0.2)

## 2024-01-02 LAB — CMP (CANCER CENTER ONLY)
ALT: 8 U/L (ref 0–44)
AST: 12 U/L — ABNORMAL LOW (ref 15–41)
Albumin: 3.5 g/dL (ref 3.5–5.0)
Alkaline Phosphatase: 51 U/L (ref 38–126)
Anion gap: 9 (ref 5–15)
BUN: 15 mg/dL (ref 8–23)
CO2: 25 mmol/L (ref 22–32)
Calcium: 9.5 mg/dL (ref 8.9–10.3)
Chloride: 106 mmol/L (ref 98–111)
Creatinine: 1.17 mg/dL (ref 0.61–1.24)
GFR, Estimated: >60 mL/min (ref >60)
Glucose, Bld: 160 mg/dL — ABNORMAL HIGH (ref 70–99)
Potassium: 3.1 mmol/L — ABNORMAL LOW (ref 3.5–5.1)
Sodium: 140 mmol/L (ref 135–145)
Total Bilirubin: 0.8 mg/dL (ref 0.0–1.2)
Total Protein: 6 g/dL — ABNORMAL LOW (ref 6.5–8.1)

## 2024-01-02 MED ORDER — DEXAMETHASONE 4 MG PO TABS
20.0000 mg | ORAL_TABLET | Freq: Once | ORAL | Status: AC
  Administered 2024-01-02: 20 mg via ORAL
  Filled 2024-01-02: qty 5

## 2024-01-02 MED ORDER — DARATUMUMAB-HYALURONIDASE-FIHJ 1800-30000 MG-UT/15ML ~~LOC~~ SOLN
1800.0000 mg | Freq: Once | SUBCUTANEOUS | Status: AC
  Administered 2024-01-02: 1800 mg via SUBCUTANEOUS
  Filled 2024-01-02: qty 15

## 2024-01-02 MED ORDER — ACETAMINOPHEN 325 MG PO TABS
650.0000 mg | ORAL_TABLET | Freq: Once | ORAL | Status: AC
  Administered 2024-01-02: 650 mg via ORAL
  Filled 2024-01-02: qty 2

## 2024-01-02 MED ORDER — ACYCLOVIR 400 MG PO TABS
400.0000 mg | ORAL_TABLET | Freq: Two times a day (BID) | ORAL | 11 refills | Status: DC
  Filled 2024-01-02: qty 60, 30d supply, fill #0
  Filled 2024-03-18: qty 60, 30d supply, fill #1
  Filled 2024-07-22: qty 60, 30d supply, fill #2

## 2024-01-02 MED ORDER — FAMOTIDINE 20 MG PO TABS
20.0000 mg | ORAL_TABLET | Freq: Once | ORAL | Status: AC
  Administered 2024-01-02: 20 mg via ORAL
  Filled 2024-01-02: qty 1

## 2024-01-02 MED ORDER — DIPHENHYDRAMINE HCL 25 MG PO CAPS
50.0000 mg | ORAL_CAPSULE | Freq: Once | ORAL | Status: AC
  Administered 2024-01-02: 50 mg via ORAL
  Filled 2024-01-02: qty 2

## 2024-01-02 NOTE — Patient Instructions (Signed)
 [facility] - A DEPT OF [facility]  Discharge Instructions: Thank you for choosing [facility] to provide your oncology and hematology care.   If you have a lab appointment with the Cancer Center, please go directly to the Cancer Center and check in at the registration area.   Wear comfortable clothing and clothing appropriate for easy access to any Portacath or PICC line.   We strive to give you quality time with your provider. You may need to reschedule your appointment if you arrive late (15 or more minutes).  Arriving late affects you and other patients whose appointments are after yours.  Also, if you miss three or more appointments without notifying the office, you may be dismissed from the clinic at the provider's discretion.      For prescription refill requests, have your pharmacy contact our office and allow 72 hours for refills to be completed.    Today you received the following chemotherapy and/or immunotherapy agents Darzalex faspro      To help prevent nausea and vomiting after your treatment, we encourage you to take your nausea medication as directed.  BELOW ARE SYMPTOMS THAT SHOULD BE REPORTED IMMEDIATELY: *FEVER GREATER THAN 100.4 F (38 C) OR HIGHER *CHILLS OR SWEATING *NAUSEA AND VOMITING THAT IS NOT CONTROLLED WITH YOUR NAUSEA MEDICATION *UNUSUAL SHORTNESS OF BREATH *UNUSUAL BRUISING OR BLEEDING *URINARY PROBLEMS (pain or burning when urinating, or frequent urination) *BOWEL PROBLEMS (unusual diarrhea, constipation, pain near the anus) TENDERNESS IN MOUTH AND THROAT WITH OR WITHOUT PRESENCE OF ULCERS (sore throat, sores in mouth, or a toothache) UNUSUAL RASH, SWELLING OR PAIN  UNUSUAL VAGINAL DISCHARGE OR ITCHING   Items with * indicate a potential emergency and should be followed up as soon as possible or go to the Emergency Department if any problems should occur.  Please show the CHEMOTHERAPY ALERT CARD or  IMMUNOTHERAPY ALERT CARD at check-in to the Emergency Department and triage nurse.  Should you have questions after your visit or need to cancel or reschedule your appointment, please contact [facility] - A DEPT OF [facility]  Dept: [phone]  and follow the prompts.  Office hours are 8:00 a.m. to 4:30 p.m. Monday - Friday. Please note that voicemails left after 4:00 p.m. may not be returned until the following business day.  We are closed weekends and major holidays. You have access to a nurse at all times for urgent questions. Please call the main number to the clinic Dept: [phone] and follow the prompts.   For any non-urgent questions, you may also contact your provider using MyChart. We now offer e-Visits for anyone 40 and older to request care online for non-urgent symptoms. For details visit [email or web address].   Also download the MyChart app! Go to the app store, search "MyChart", open the app, select [place], and log in with your MyChart username and password.

## 2024-01-02 NOTE — Progress Notes (Signed)
 HEMATOLOGY/ONCOLOGY CLINIC NOTE  Date of Service: 01/02/24   Patient Care Team: Vicci Barnie NOVAK, MD as PCP - General (Internal Medicine)  CHIEF COMPLAINTS/PURPOSE OF CONSULTATION:  Follow-up for continued evaluation and management of relapsed multiple myeloma  Oncologic History:  78 y.o. male with diagnosis of IgA lambda active multiple myeloma, ISS Stage I. Active disease diagnosed based on presence of anemia, kidney insufficiency, and paraproteinemia with significant predominance of lambda light chains as well as significant elevation of IgA. Bone marrow biopsy confirmed presence of atypical monoclonal plasma cell process in the bone marrow comprising at least 7% of the cellularity. Based on the findings, patient was started on treatment with lenalidomide , bortezomib , and low-dose dexamethasone  based on the anticipated tolerance by the patient. Lenalidomide  was started at 10 mg daily based on creatinine clearance of 42, dexamethasone  dose was reduced to 20 mg weekly based on patient's age. Treatment was complicated by rapid development of cutaneous rash attributed to lenalidomide , inaddition to decreased renal function and now patient has persistent severe anemia. Side-effects were attributed to Lenalidomide  and lenalidomide  was discontinued with subsequent recovery.  Patient has been receiving bortezomib  weekly with low-dose dexamethasone  in 4-day cycles.   Patient has completed induction systemic therapy for his disease with repeat bone marrow biopsy confirming complete response including no evidence of minimal residual disease by cytogenetics or FISH.  HISTORY OF PRESENTING ILLNESS:  Please see previous note for details of initial presentation.  INTERVAL HISTORY:   [name] is a 78 y.o. male is here for continued evaluation and management of his relapsed multiple myeloma. Patient was last seen by me on 11/07/2023 and reported balance issues and stable neuropathy in his  feet.   He reports that he has been doing fairly well since his last clinical visit.   Patient denies any new bone pain, new leg swelling, or abdominal pain.   He complains that his neuropathy is worsening in the feet. He describes increased sensitivity in the feet.  Patient currently takes gabapentin  300 MG tablet, one tablet in morning and two at night. He notes that gapapentin is not very effective.   Patient complains of balance issues but denies any falls.   His blood sugars are generally fairly stable and he notes taking insulin  regularly.   He notes that he works with physical therapy at this time. He generally uses a cane when outside of the home, but admits that he sometimes forgets to use it.   He reports that at its highest, his blood sugars are in the 200s.   Patient reports normal appetite and does stay well-hydrated.   He does take potassium tablets.   Patient reports fluctuating mild diarrhea and constipation sometimes.   In regards to his current pomalidomide  cycle, he is currently on his last week of Pomalidomide .   He reports stable unchanged back pain when walking.   Patient denies any nose bleeds, gum bleeds, hematuria, or blood in stools.   He takes vitamin B complex regularly as well as B12 and Vitamin D .   MEDICAL HISTORY:  Past Medical History:  Diagnosis Date   Anemia    Arthritis    shoulders,feet    Cataract    per eye dr -has appt 5-11   CKD (chronic kidney disease), stage III (HCC)    Elevated PSA    History of ketoacidosis    03-29-2015    History of sepsis    07-25-2013  non-compliant w/ medication   Hyperlipidemia  Hypertension    Nocturia    Peripheral neuropathy    Prostate cancer (HCC)    Type 2 diabetes mellitus with insulin  therapy [facility])     SURGICAL HISTORY: Past Surgical History:  Procedure Laterality Date   CIRCUMCISION  1990's   PROSTATE BIOPSY N/A 12/21/2015   Procedure: BIOPSY TRANSRECTAL ULTRASONIC PROSTATE (TUBP);   Surgeon: Donnice Brooks, MD;  Location: [facility]  Service: Urology;  Laterality: N/A;    SOCIAL HISTORY: Social History   Socioeconomic History   Marital status: Single    Spouse name: Not on file   Number of children: Not on file   Years of education: Not on file   Highest education level: Not on file  Occupational History   Not on file  Tobacco Use   Smoking status: Every Day    Current packs/day: 0.33    Average packs/day: 0.3 packs/day for 50.0 years (16.5 ttl pk-yrs)    Types: Cigarettes   Smokeless tobacco: Never   Tobacco comments:    1 ppwk-4-5 a day   Vaping Use   Vaping status: Never Used  Substance and Sexual Activity   Alcohol use: Yes    Alcohol/week: 1.0 standard drink of alcohol    Types: 1 Cans of beer per week    Comment: a beer every now and again   Drug use: No   Sexual activity: Not Currently  Other Topics Concern   Not on file  Social History Narrative   Not on file   Social Drivers of Health   Financial Resource Strain: Low Risk  (05/11/2023)   Overall Financial Resource Strain (CARDIA)    Difficulty of Paying Living Expenses: Not hard at all  Food Insecurity: No Food Insecurity (05/11/2023)   Hunger Vital Sign    Worried About Running Out of Food in the Last Year: Never true    Ran Out of Food in the Last Year: Never true  Transportation Needs: No Transportation Needs (05/11/2023)   PRAPARE - Administrator, Civil Service (Medical): No    Lack of Transportation (Non-Medical): No  Physical Activity: Inactive (05/11/2023)   Exercise Vital Sign    Days of Exercise per Week: 0 days    Minutes of Exercise per Session: 0 min  Stress: No Stress Concern Present (05/11/2023)   [employer] of Occupational Health - Occupational Stress Questionnaire    Feeling of Stress : Not at all  Social Connections: Moderately Isolated (05/11/2023)   Social Connection and Isolation Panel    Frequency of Communication with  Friends and Family: Three times a week    Frequency of Social Gatherings with Friends and Family: Twice a week    Attends Religious Services: Never    Database administrator or Organizations: Yes    Attends Engineer, structural: More than 4 times per year    Marital Status: Never married  Intimate Partner Violence: Not At Risk (05/11/2023)   Humiliation, Afraid, Rape, and Kick questionnaire    Fear of Current or Ex-Partner: No    Emotionally Abused: No    Physically Abused: No    Sexually Abused: No    FAMILY HISTORY: Family History  Problem Relation Age of Onset   Kidney disease Mother    Cancer Neg Hx    Colon cancer Neg Hx    Colon polyps Neg Hx    Esophageal cancer Neg Hx    Rectal cancer Neg Hx    Stomach cancer  Neg Hx     ALLERGIES:  is allergic to other and lisinopril .  MEDICATIONS:  Current Outpatient Medications  Medication Sig Dispense Refill   Accu-Chek Softclix Lancets lancets Use to test blood sugar 3 times daily. 100 each 3   acetaminophen  (TYLENOL ) 500 MG tablet Take 2 tablets (1,000 mg total) by mouth every 6 (six) hours as needed. 30 tablet 0   amLODipine  (NORVASC ) 10 MG tablet Take 1 tablet (10 mg total) by mouth daily. (Patient taking differently: Take 10 mg by mouth daily. Take 1/2 tablet) 90 tablet 1   aspirin  EC 81 MG tablet Take 81 mg by mouth daily. Swallow whole.     b complex vitamins capsule Take 1 capsule by mouth daily. 30 capsule 5   Blood Glucose Monitoring Suppl (ACCU-CHEK GUIDE) w/Device KIT Use to test blood sugar three times daily 1 kit 0   buPROPion  (WELLBUTRIN ) 75 MG tablet Take 1 tablet (75 mg total) by mouth 2 (two) times daily. To reduce urge of smoking. 60 tablet 3   cetirizine  (ZYRTEC ) 5 MG tablet Take 1 tablet (5 mg total) by mouth at bedtime. 30 tablet 1   Continuous Blood Gluc Receiver (FREESTYLE LIBRE READER) DEVI Use to check blood sugar 3x daily. E11.42 1 each 0   Continuous Blood Gluc Sensor (FREESTYLE LIBRE SENSOR  SYSTEM) MISC Use to check blood sugar 3 times daily. Change sensor every 2 weeks. 2 each 12   donepezil  (ARICEPT ) 5 MG tablet TAKE 1 TABLET (5 MG TOTAL) BY MOUTH AT BEDTIME. FOR MEMORY ISSUES (Patient not taking: Reported on 10/10/2023) 30 tablet 5   fluticasone  (FLONASE ) 50 MCG/ACT nasal spray Place 2 sprays into both nostrils daily. 16 g 2   furosemide  (LASIX ) 20 MG tablet Take 1 tablet (20 mg total) by mouth daily in the morning for 7 days (until 09/08/22). Save remaining tablets. 30 tablet 0   gabapentin  (NEURONTIN ) 300 MG capsule Take 1 capsule (300 mg total) by mouth every morning AND 1 capsule (300 mg total) daily at 12 noon AND 2 capsules (600 mg total) at bedtime. Stop Lyrica . 120 capsule 11   glucose blood (ACCU-CHEK GUIDE TEST) test strip Use to test blood sugar 3 times daily. 100 each 2   Insulin  Lispro Prot & Lispro (HUMALOG  MIX 75/25 KWIKPEN) (75-25) 100 UNIT/ML Kwikpen Inject 9 units subcutaneously with breakfast and dinner. Increase to 12 units for 3-5 days twice a month after dexamethasone /decadron . 15 mL 11   Insulin  Pen Needle (INSUPEN PEN NEEDLES) 32G X 4 MM MISC USE AS DIRECTED TWICE DAILY WITH INSULIN  100 each 1   Multiple Vitamin (MULTIVITAMIN WITH MINERALS) TABS tablet Take 1 tablet by mouth daily. 60 tablet 2   ondansetron  (ZOFRAN ) 8 MG tablet Take 1 tablet (8 mg total) by mouth 2 (two) times daily as needed (Nausea or vomiting). 30 tablet 1   pomalidomide  (POMALYST ) 2 MG capsule Take 1 capsule (2 mg total) by mouth daily. 3 weeks on 1 week off 21 capsule 0   potassium chloride  SA (KLOR-CON  M) 20 MEQ tablet Take 1 tablet (20 mEq total) by mouth daily. 30 tablet 1   pravastatin  (PRAVACHOL ) 20 MG tablet Take 1 tablet (20 mg total) by mouth daily. 90 tablet 1   prochlorperazine  (COMPAZINE ) 10 MG tablet Take 1 tablet (10 mg total) by mouth every 6 (six) hours as needed (Nausea or vomiting). 30 tablet 1   tiZANidine  (ZANAFLEX ) 4 MG tablet Take 1 tablet (4 mg total) by mouth every 8  (  eight) hours as needed for muscle spasms. 90 tablet 5   valsartan  (DIOVAN ) 40 MG tablet Take 1 tablet (40 mg total) by mouth daily. STOP LISINOPRIL  90 tablet 3   valsartan  (DIOVAN ) 40 MG tablet Take 1 tablet (40 mg total) by mouth daily. STOP LISINOPRIL      Vitamin D , Ergocalciferol , (DRISDOL ) 1.25 MG (50000 UNIT) CAPS capsule Take 1 capsule (50,000 Units total) by mouth once a week. 17 capsule 3   No current facility-administered medications for this visit.    REVIEW OF SYSTEMS:  10 Point review of Systems was done is negative except as noted above.   PHYSICAL EXAMINATION: .BP (!) 145/68   Pulse 73   Temp (!) 97.5 F (36.4 C)   Resp 16   Wt 161 lb 12.8 oz (73.4 kg)   SpO2 95%   BMI 26.12 kg/m  GENERAL:alert, in no acute distress and comfortable SKIN: no acute rashes, no significant lesions EYES: conjunctiva are pink and non-injected, sclera anicteric OROPHARYNX: MMM, no exudates, no oropharyngeal erythema or ulceration NECK: supple, no JVD LYMPH:  no palpable lymphadenopathy in the cervical, axillary or inguinal regions LUNGS: clear to auscultation b/l with normal respiratory effort HEART: regular rate & rhythm ABDOMEN:  normoactive bowel sounds , non tender, not distended. Extremity: no pedal edema PSYCH: alert & oriented x 3 with fluent speech NEURO: no focal motor/sensory deficits   LABORATORY DATA:  I have reviewed the data as listed  .    Latest Ref Rng & Units 01/02/2024   11:39 AM 12/05/2023    9:20 AM 11/07/2023    8:51 AM  CBC  WBC 4.0 - 10.5 K/uL 3.9  2.5  2.5   Hemoglobin 13.0 - 17.0 g/dL 8.1  9.4  89.5   Hematocrit 39.0 - 52.0 % 24.2  27.9  30.1   Platelets 150 - 400 K/uL 269  224  223       Latest Ref Rng & Units 01/02/2024   11:39 AM 12/05/2023    9:20 AM 11/07/2023    8:51 AM  CMP  Glucose 70 - 99 mg/dL 839  829  801   BUN 8 - 23 mg/dL 15  11  12    Creatinine 0.61 - 1.24 mg/dL 8.82  8.68  8.48   Sodium 135 - 145 mmol/L 140  138  140   Potassium  3.5 - 5.1 mmol/L 3.1  3.4  3.8   Chloride 98 - 111 mmol/L 106  104  109   CO2 22 - 32 mmol/L 25  26  26    Calcium  8.9 - 10.3 mg/dL 9.5  9.1  9.2   Total Protein 6.5 - 8.1 g/dL 6.0  6.1  6.2   Total Bilirubin 0.0 - 1.2 mg/dL 0.8  0.8  0.7   Alkaline Phos 38 - 126 U/L 51  43  102   AST 15 - 41 U/L 12  11  13    ALT 0 - 44 U/L 8  8  12       08/29/17 BM Bx:    08/29/17 Cytogenetics:   03/13/17 Cytogenetics:        PATHOLOGY Surgical Pathology  CASE: [id number]  PATIENT: Robert Sparks  Bone Marrow Report      Clinical History: Multiple myeloma in relapse (HCC) ,(BH)      DIAGNOSIS:   BONE MARROW, ASPIRATE, CLOT, CORE:  -Hypercellular bone marrow for age with plasma cell neoplasm  -See comment   PERIPHERAL BLOOD:  -Mild normocytic-normochromic anemia  -  Eosinophilia   COMMENT:   The bone marrow is hypercellular for age with increased number of plasma  cells representing 8% of all cells in the aspirate associated with  numerous small clusters in the clot and biopsy sections. The plasma  cells display lambda light chain restriction consistent with plasma cell  neoplasm.  The background shows trilineage hematopoiesis with generally  nonspecific myeloid changes, likely secondary in nature in this setting.  Correlation with cytogenetic and FISH studies is recommended.      RADIOGRAPHIC STUDIES: I have personally reviewed the radiological images as listed and agreed with the findings in the report. No results found.  ASSESSMENT & PLAN:   78 y.o. male with   1.  Relapsed IgA Lambda Multiple Myeloma ISS Stage I    Active disease was previously diagnosed based on presence of anemia, kidney insufficiency, and paraproteinemia with significant predominance of lambda light chains as well as significant elevation of IgA. Patient is status post patient was treated with induction bortezomib  Revlimid  dexamethasone .  Had issues with skin rashes with Revlimid  and this  was discontinued.  Completed bortezomib  dexamethasone  induction and achieved complete remission. Was on maintenance Velcade  2 weeks which was then switched to maintenance Ninlaro . Patient had issues with worsening neuropathy which was a combination of Velcade  and his diabetes.  Ninlaro  was discontinued as well after maintenance of more than 2 years.  2.  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 Bone marrow biopsy shows 8% lambda restricted plasma cells PET CT scan with no hypermetabolic osseous lesions  3.  Hypertension 4.  Diabetes type 2 5 .chronic kidney disease due to hypertension and diabetes. 6.  Peripheral neuropathy related to diabetes and previous myeloma treatments.  PLAN: -Discussed lab results on 01/02/24 in detail with patient. CBC showed WBC of 3.9K, hemoglobin of 8.1, and platelets of 269K. -patient is more anemic, likely from pomalidomide  -his last myeloma lab showed that he continues to have undetectable M protein -creatinine improved from 1.31 mg/dL to 8.82 mg/dL in the last 4 weeks -potassium noted to be 3.1 mmol/L -continue oral potassium -discussed that if his potassium level is low, he may need oral potassium with his treatment today -discussed that his myeloma, daratumumab , and pomalidomide  are not predominant factors causing neuropathy. -discussed that his neuropathy is mainly a function of his DM -discussed that his neuropathy can cause balance issues if his feet are numb -advised patient to use a cane regularly to avoid falls -continue to work with physical therapy to work on his balance  -he has no clinical sign or lab evidence of myeloma progression at this time  -patient has no major toxicities from Daratumumab  or Pomalidomide .  -continue current treatment regimen with same supportive medications.  -continue daratumumab  every two weeks -continue Pomalidomide  as  prescribed -he shall return to clinic in a couple of months  FOLLOW-UP: Per integrated scheduling MD visit in 8 weeks  The total time spent in the appointment was 30 minutes* .  All of the patient's questions were answered with apparent satisfaction. The patient knows to call the clinic with any problems, questions or concerns.   Emaline Saran MD MS AAHIVMS [facility] Hematology/Oncology Physician [facility]  .*Total Encounter Time as defined by the Centers for Medicare and Medicaid Services includes, in addition to the face-to-face time of a patient visit (documented in the note above) non-face-to-face time: obtaining and reviewing outside  history, ordering and reviewing medications, tests or procedures, care coordination (communications with other health care professionals or caregivers) and documentation in the medical record.    I,Mitra Faeizi,acting as a Neurosurgeon for Emaline Saran, MD.,have documented all relevant documentation on the behalf of Emaline Saran, MD,as directed by  Emaline Saran, MD while in the presence of Emaline Saran, MD.  .I have reviewed the above documentation for accuracy and completeness, and I agree with the above. .Jesus Poplin Kishore Felice Hope MD

## 2024-01-03 ENCOUNTER — Other Ambulatory Visit: Payer: Self-pay

## 2024-01-03 LAB — KAPPA/LAMBDA LIGHT CHAINS
Kappa free light chain: 66.4 mg/L — ABNORMAL HIGH (ref 3.3–19.4)
Kappa, lambda light chain ratio: 1.74 — ABNORMAL HIGH (ref 0.26–1.65)
Lambda free light chains: 38.2 mg/L — ABNORMAL HIGH (ref 5.7–26.3)

## 2024-01-04 ENCOUNTER — Other Ambulatory Visit: Payer: Self-pay

## 2024-01-05 ENCOUNTER — Other Ambulatory Visit: Payer: Self-pay

## 2024-01-07 ENCOUNTER — Other Ambulatory Visit: Payer: Self-pay

## 2024-01-07 ENCOUNTER — Other Ambulatory Visit: Payer: Self-pay | Admitting: Internal Medicine

## 2024-01-07 LAB — MULTIPLE MYELOMA PANEL, SERUM
Albumin SerPl Elph-Mcnc: 3.1 g/dL (ref 2.9–4.4)
Albumin/Glob SerPl: 1.4 (ref 0.7–1.7)
Alpha 1: 0.2 g/dL (ref 0.0–0.4)
Alpha2 Glob SerPl Elph-Mcnc: 0.7 g/dL (ref 0.4–1.0)
B-Globulin SerPl Elph-Mcnc: 0.8 g/dL (ref 0.7–1.3)
Gamma Glob SerPl Elph-Mcnc: 0.5 g/dL (ref 0.4–1.8)
Globulin, Total: 2.3 g/dL (ref 2.2–3.9)
IgA: 166 mg/dL (ref 61–437)
IgG (Immunoglobin G), Serum: 612 mg/dL (ref 603–1613)
IgM (Immunoglobulin M), Srm: 26 mg/dL (ref 15–143)
Total Protein ELP: 5.4 g/dL — ABNORMAL LOW (ref 6.0–8.5)

## 2024-01-08 ENCOUNTER — Other Ambulatory Visit: Payer: Self-pay

## 2024-01-08 ENCOUNTER — Encounter: Payer: Self-pay | Admitting: Hematology

## 2024-01-08 MED ORDER — INSUPEN PEN NEEDLES 32G X 4 MM MISC
1.0000 | Freq: Two times a day (BID) | 1 refills | Status: DC
  Filled 2024-01-08: qty 100, 50d supply, fill #0
  Filled 2024-03-25: qty 100, 50d supply, fill #1

## 2024-01-14 ENCOUNTER — Other Ambulatory Visit: Payer: Self-pay

## 2024-01-15 ENCOUNTER — Ambulatory Visit: Admitting: Internal Medicine

## 2024-01-15 ENCOUNTER — Other Ambulatory Visit: Payer: Self-pay

## 2024-01-15 DIAGNOSIS — H25813 Combined forms of age-related cataract, bilateral: Secondary | ICD-10-CM | POA: Diagnosis not present

## 2024-01-15 DIAGNOSIS — H10413 Chronic giant papillary conjunctivitis, bilateral: Secondary | ICD-10-CM | POA: Diagnosis not present

## 2024-01-15 DIAGNOSIS — Z794 Long term (current) use of insulin: Secondary | ICD-10-CM | POA: Diagnosis not present

## 2024-01-15 DIAGNOSIS — H40013 Open angle with borderline findings, low risk, bilateral: Secondary | ICD-10-CM | POA: Diagnosis not present

## 2024-01-15 DIAGNOSIS — E119 Type 2 diabetes mellitus without complications: Secondary | ICD-10-CM | POA: Diagnosis not present

## 2024-01-15 LAB — OPHTHALMOLOGY REPORT-SCANNED

## 2024-01-15 MED ORDER — OFLOXACIN 0.3 % OP SOLN
1.0000 [drp] | Freq: Four times a day (QID) | OPHTHALMIC | 1 refills | Status: DC
  Filled 2024-01-15: qty 5, 25d supply, fill #0
  Filled 2024-03-18: qty 5, 25d supply, fill #1

## 2024-01-15 MED ORDER — PREDNISOLONE ACETATE 1 % OP SUSP
1.0000 [drp] | Freq: Four times a day (QID) | OPHTHALMIC | 1 refills | Status: DC
  Filled 2024-01-15: qty 10, 50d supply, fill #0
  Filled 2024-03-18: qty 10, 50d supply, fill #1

## 2024-01-15 MED ORDER — KETOROLAC TROMETHAMINE 0.5 % OP SOLN
1.0000 [drp] | Freq: Four times a day (QID) | OPHTHALMIC | 1 refills | Status: DC
  Filled 2024-01-15: qty 5, 25d supply, fill #0
  Filled 2024-03-18: qty 5, 25d supply, fill #1

## 2024-01-22 ENCOUNTER — Other Ambulatory Visit: Payer: Self-pay

## 2024-01-24 ENCOUNTER — Other Ambulatory Visit: Payer: Self-pay

## 2024-01-24 DIAGNOSIS — C9002 Multiple myeloma in relapse: Secondary | ICD-10-CM

## 2024-01-24 MED ORDER — POMALIDOMIDE 2 MG PO CAPS: 2.0000 mg | ORAL_CAPSULE | Freq: Every day | ORAL | 0 refills | Status: DC

## 2024-01-28 ENCOUNTER — Other Ambulatory Visit: Payer: Self-pay

## 2024-01-28 ENCOUNTER — Encounter: Attending: Physical Medicine and Rehabilitation | Admitting: Physical Medicine and Rehabilitation

## 2024-01-28 ENCOUNTER — Other Ambulatory Visit: Payer: Self-pay | Admitting: Internal Medicine

## 2024-01-28 DIAGNOSIS — C9002 Multiple myeloma in relapse: Secondary | ICD-10-CM | POA: Insufficient documentation

## 2024-01-28 DIAGNOSIS — F17200 Nicotine dependence, unspecified, uncomplicated: Secondary | ICD-10-CM | POA: Insufficient documentation

## 2024-01-28 DIAGNOSIS — E1142 Type 2 diabetes mellitus with diabetic polyneuropathy: Secondary | ICD-10-CM

## 2024-01-28 DIAGNOSIS — M48062 Spinal stenosis, lumbar region with neurogenic claudication: Secondary | ICD-10-CM | POA: Insufficient documentation

## 2024-01-29 ENCOUNTER — Other Ambulatory Visit: Payer: Self-pay

## 2024-01-29 MED ORDER — ACCU-CHEK GUIDE TEST VI STRP
ORAL_STRIP | 2 refills | Status: DC
  Filled 2024-01-29: qty 100, 33d supply, fill #0
  Filled 2024-02-20 – 2024-02-25 (×4): qty 100, 33d supply, fill #1
  Filled 2024-03-18 – 2024-03-25 (×2): qty 100, 33d supply, fill #2
  Filled ????-??-??: fill #1

## 2024-01-30 ENCOUNTER — Inpatient Hospital Stay

## 2024-01-30 ENCOUNTER — Other Ambulatory Visit: Payer: Self-pay

## 2024-01-30 ENCOUNTER — Inpatient Hospital Stay: Attending: Hematology

## 2024-01-30 VITALS — BP 124/66 | HR 68 | Temp 97.9°F | Resp 16 | Wt 159.7 lb

## 2024-01-30 DIAGNOSIS — Z5112 Encounter for antineoplastic immunotherapy: Secondary | ICD-10-CM | POA: Diagnosis not present

## 2024-01-30 DIAGNOSIS — C9002 Multiple myeloma in relapse: Secondary | ICD-10-CM | POA: Insufficient documentation

## 2024-01-30 DIAGNOSIS — Z7189 Other specified counseling: Secondary | ICD-10-CM

## 2024-01-30 LAB — CMP (CANCER CENTER ONLY)
ALT: 24 U/L (ref 0–44)
AST: 19 U/L (ref 15–41)
Albumin: 3.8 g/dL (ref 3.5–5.0)
Alkaline Phosphatase: 67 U/L (ref 38–126)
Anion gap: 5 (ref 5–15)
BUN: 19 mg/dL (ref 8–23)
CO2: 26 mmol/L (ref 22–32)
Calcium: 9.3 mg/dL (ref 8.9–10.3)
Chloride: 108 mmol/L (ref 98–111)
Creatinine: 1.52 mg/dL — ABNORMAL HIGH (ref 0.61–1.24)
GFR, Estimated: 47 mL/min — ABNORMAL LOW (ref >60)
Glucose, Bld: 198 mg/dL — ABNORMAL HIGH (ref 70–99)
Potassium: 4.1 mmol/L (ref 3.5–5.1)
Sodium: 139 mmol/L (ref 135–145)
Total Bilirubin: 0.4 mg/dL (ref 0.0–1.2)
Total Protein: 6.1 g/dL — ABNORMAL LOW (ref 6.5–8.1)

## 2024-01-30 LAB — CBC WITH DIFFERENTIAL (CANCER CENTER ONLY)
Abs Immature Granulocytes: 0.01 K/uL (ref 0.00–0.07)
Basophils Absolute: 0.1 K/uL (ref 0.0–0.1)
Basophils Relative: 2 %
Eosinophils Absolute: 0.3 K/uL (ref 0.0–0.5)
Eosinophils Relative: 8 %
HCT: 29.8 % — ABNORMAL LOW (ref 39.0–52.0)
Hemoglobin: 9.6 g/dL — ABNORMAL LOW (ref 13.0–17.0)
Immature Granulocytes: 0 %
Lymphocytes Relative: 42 %
Lymphs Abs: 1.5 K/uL (ref 0.7–4.0)
MCH: 31.6 pg (ref 26.0–34.0)
MCHC: 32.2 g/dL (ref 30.0–36.0)
MCV: 98 fL (ref 80.0–100.0)
Monocytes Absolute: 0.5 K/uL (ref 0.1–1.0)
Monocytes Relative: 13 %
Neutro Abs: 1.3 K/uL — ABNORMAL LOW (ref 1.7–7.7)
Neutrophils Relative %: 35 %
Platelet Count: 211 K/uL (ref 150–400)
RBC: 3.04 MIL/uL — ABNORMAL LOW (ref 4.22–5.81)
RDW: 16.3 % — ABNORMAL HIGH (ref 11.5–15.5)
Smear Review: NORMAL
WBC Count: 3.6 K/uL — ABNORMAL LOW (ref 4.0–10.5)
nRBC: 0 % (ref 0.0–0.2)

## 2024-01-30 MED ORDER — DARATUMUMAB-HYALURONIDASE-FIHJ 1800-30000 MG-UT/15ML ~~LOC~~ SOLN
1800.0000 mg | Freq: Once | SUBCUTANEOUS | Status: AC
  Administered 2024-01-30: 1800 mg via SUBCUTANEOUS
  Filled 2024-01-30: qty 15

## 2024-01-30 MED ORDER — FAMOTIDINE 20 MG PO TABS
20.0000 mg | ORAL_TABLET | Freq: Once | ORAL | Status: AC
  Administered 2024-01-30: 20 mg via ORAL
  Filled 2024-01-30: qty 1

## 2024-01-30 MED ORDER — DIPHENHYDRAMINE HCL 25 MG PO CAPS
50.0000 mg | ORAL_CAPSULE | Freq: Once | ORAL | Status: AC
  Administered 2024-01-30: 50 mg via ORAL
  Filled 2024-01-30: qty 2

## 2024-01-30 MED ORDER — DEXAMETHASONE 4 MG PO TABS
20.0000 mg | ORAL_TABLET | Freq: Once | ORAL | Status: AC
Start: 2024-01-30 — End: 2024-01-30
  Administered 2024-01-30: 20 mg via ORAL
  Filled 2024-01-30: qty 5

## 2024-01-30 MED ORDER — ACETAMINOPHEN 325 MG PO TABS
650.0000 mg | ORAL_TABLET | Freq: Once | ORAL | Status: AC
  Administered 2024-01-30: 650 mg via ORAL
  Filled 2024-01-30: qty 2

## 2024-01-30 NOTE — Patient Instructions (Signed)
 [facility] - A DEPT OF [facility]  Discharge Instructions: Thank you for choosing [facility] to provide your oncology and hematology care.   If you have a lab appointment with the Cancer Center, please go directly to the Cancer Center and check in at the registration area.   Wear comfortable clothing and clothing appropriate for easy access to any Portacath or PICC line.   We strive to give you quality time with your provider. You may need to reschedule your appointment if you arrive late (15 or more minutes).  Arriving late affects you and other patients whose appointments are after yours.  Also, if you miss three or more appointments without notifying the office, you may be dismissed from the clinic at the provider's discretion.      For prescription refill requests, have your pharmacy contact our office and allow 72 hours for refills to be completed.    Today you received the following chemotherapy and/or immunotherapy agents Darzalex faspro      To help prevent nausea and vomiting after your treatment, we encourage you to take your nausea medication as directed.  BELOW ARE SYMPTOMS THAT SHOULD BE REPORTED IMMEDIATELY: *FEVER GREATER THAN 100.4 F (38 C) OR HIGHER *CHILLS OR SWEATING *NAUSEA AND VOMITING THAT IS NOT CONTROLLED WITH YOUR NAUSEA MEDICATION *UNUSUAL SHORTNESS OF BREATH *UNUSUAL BRUISING OR BLEEDING *URINARY PROBLEMS (pain or burning when urinating, or frequent urination) *BOWEL PROBLEMS (unusual diarrhea, constipation, pain near the anus) TENDERNESS IN MOUTH AND THROAT WITH OR WITHOUT PRESENCE OF ULCERS (sore throat, sores in mouth, or a toothache) UNUSUAL RASH, SWELLING OR PAIN  UNUSUAL VAGINAL DISCHARGE OR ITCHING   Items with * indicate a potential emergency and should be followed up as soon as possible or go to the Emergency Department if any problems should occur.  Please show the CHEMOTHERAPY ALERT CARD or  IMMUNOTHERAPY ALERT CARD at check-in to the Emergency Department and triage nurse.  Should you have questions after your visit or need to cancel or reschedule your appointment, please contact [facility] - A DEPT OF [facility]  Dept: [phone]  and follow the prompts.  Office hours are 8:00 a.m. to 4:30 p.m. Monday - Friday. Please note that voicemails left after 4:00 p.m. may not be returned until the following business day.  We are closed weekends and major holidays. You have access to a nurse at all times for urgent questions. Please call the main number to the clinic Dept: [phone] and follow the prompts.   For any non-urgent questions, you may also contact your provider using MyChart. We now offer e-Visits for anyone 40 and older to request care online for non-urgent symptoms. For details visit [email or web address].   Also download the MyChart app! Go to the app store, search "MyChart", open the app, select [place], and log in with your MyChart username and password.

## 2024-01-31 LAB — KAPPA/LAMBDA LIGHT CHAINS
Kappa free light chain: 58.8 mg/L — ABNORMAL HIGH (ref 3.3–19.4)
Kappa, lambda light chain ratio: 1.99 — ABNORMAL HIGH (ref 0.26–1.65)
Lambda free light chains: 29.6 mg/L — ABNORMAL HIGH (ref 5.7–26.3)

## 2024-02-01 LAB — MULTIPLE MYELOMA PANEL, SERUM
Albumin SerPl Elph-Mcnc: 3.5 g/dL (ref 2.9–4.4)
Albumin/Glob SerPl: 1.6 (ref 0.7–1.7)
Alpha 1: 0.2 g/dL (ref 0.0–0.4)
Alpha2 Glob SerPl Elph-Mcnc: 0.7 g/dL (ref 0.4–1.0)
B-Globulin SerPl Elph-Mcnc: 0.9 g/dL (ref 0.7–1.3)
Gamma Glob SerPl Elph-Mcnc: 0.5 g/dL (ref 0.4–1.8)
Globulin, Total: 2.3 g/dL (ref 2.2–3.9)
IgA: 132 mg/dL (ref 61–437)
IgG (Immunoglobin G), Serum: 597 mg/dL — ABNORMAL LOW (ref 603–1613)
IgM (Immunoglobulin M), Srm: 29 mg/dL (ref 15–143)
Total Protein ELP: 5.8 g/dL — ABNORMAL LOW (ref 6.0–8.5)

## 2024-02-02 ENCOUNTER — Other Ambulatory Visit: Payer: Self-pay

## 2024-02-03 ENCOUNTER — Other Ambulatory Visit: Payer: Self-pay

## 2024-02-07 ENCOUNTER — Telehealth: Payer: Self-pay | Admitting: Internal Medicine

## 2024-02-07 NOTE — Telephone Encounter (Signed)
 Confirmed appt for 8/1

## 2024-02-08 ENCOUNTER — Encounter: Payer: Self-pay | Admitting: Internal Medicine

## 2024-02-08 ENCOUNTER — Ambulatory Visit: Attending: Internal Medicine | Admitting: Internal Medicine

## 2024-02-08 VITALS — BP 149/73 | HR 64 | Temp 97.5°F | Ht 66.0 in | Wt 162.0 lb

## 2024-02-08 DIAGNOSIS — R2689 Other abnormalities of gait and mobility: Secondary | ICD-10-CM

## 2024-02-08 DIAGNOSIS — E785 Hyperlipidemia, unspecified: Secondary | ICD-10-CM

## 2024-02-08 DIAGNOSIS — I152 Hypertension secondary to endocrine disorders: Secondary | ICD-10-CM

## 2024-02-08 DIAGNOSIS — C9002 Multiple myeloma in relapse: Secondary | ICD-10-CM

## 2024-02-08 DIAGNOSIS — R42 Dizziness and giddiness: Secondary | ICD-10-CM | POA: Diagnosis not present

## 2024-02-08 DIAGNOSIS — F17210 Nicotine dependence, cigarettes, uncomplicated: Secondary | ICD-10-CM | POA: Diagnosis not present

## 2024-02-08 DIAGNOSIS — E1169 Type 2 diabetes mellitus with other specified complication: Secondary | ICD-10-CM

## 2024-02-08 DIAGNOSIS — F17200 Nicotine dependence, unspecified, uncomplicated: Secondary | ICD-10-CM

## 2024-02-08 DIAGNOSIS — N1831 Chronic kidney disease, stage 3a: Secondary | ICD-10-CM | POA: Diagnosis not present

## 2024-02-08 DIAGNOSIS — E1142 Type 2 diabetes mellitus with diabetic polyneuropathy: Secondary | ICD-10-CM

## 2024-02-08 DIAGNOSIS — Z794 Long term (current) use of insulin: Secondary | ICD-10-CM | POA: Diagnosis not present

## 2024-02-08 LAB — POCT GLYCOSYLATED HEMOGLOBIN (HGB A1C): HbA1c, POC (controlled diabetic range): 7.1 % — AB (ref 0.0–7.0)

## 2024-02-08 LAB — GLUCOSE, POCT (MANUAL RESULT ENTRY): POC Glucose: 191 mg/dL — AB (ref 70–99)

## 2024-02-08 NOTE — Patient Instructions (Signed)
 VISIT SUMMARY:  You had a follow-up appointment to review your diabetes, hypertension, chronic kidney disease, and other health concerns. Your A1c has increased slightly, and your blood sugar was high today, likely due to your breakfast. Your blood pressure is well-controlled, and your kidney function is stable. We discussed your smoking habits and the importance of taking your medication to help quit. You also mentioned experiencing dizziness and imbalance, which we will investigate further.  YOUR PLAN:  -TYPE 2 DIABETES MELLITUS: Your A1c has increased to 7.1%, indicating that your blood sugar control needs improvement. Continue taking your Humalog  75/25 insulin  mix at 9 units twice a day, and increase to 12 units for 3-5 days twice a month after taking Decadron . Maintain your blood sugar above 90 and manage low blood sugar episodes with glucose tablets or Lifesavers. Avoid high carbohydrate snacks like potato chips.  -HYPERTENSION: Your blood pressure is well-controlled at 121/64. Continue taking amlodipine  10 mg daily and valsartan  40 mg daily. Keep limiting your salt intake.  -CHRONIC KIDNEY DISEASE WITH SECONDARY HYPERPARATHYROIDISM AND VITAMIN D  DEFICIENCY: Your kidney function is stable but not optimal. Continue taking ergocalciferol  50,000 units once a week for your vitamin D  deficiency and secondary hyperparathyroidism.  -NICOTINE  DEPENDENCE, CURRENT SMOKER: You continue to smoke about a third of a pack per day and are not consistently taking Wellbutrin  to reduce cravings. Please take Wellbutrin  daily to help you quit smoking, and consider your readiness to quit.  -DYSLIPIDEMIA: Your last LDL cholesterol reading was 83, which is above the target of less than 70. Continue taking pravastatin , and we will check your cholesterol at your next visit.  -PERIPHERAL NEUROPATHY WITH DIZZINESS AND IMBALANCE: You experience dizziness and imbalance, especially when closing your eyes or changing  positions. This may be related to your neuropathy and anemia from multiple myeloma. We will order a CT scan of your head to rule out a stroke, refer you to physical therapy for balance training, and check your blood pressure and pulse while sitting and standing to assess for orthostatic hypotension.  -MULTIPLE MYELOMA WITH ANEMIA OF CHRONIC DISEASE: Your anemia is likely due to your medication for multiple myeloma. Continue with your current treatment and follow up with your oncologist as scheduled.  INSTRUCTIONS:  Please schedule a cholesterol check at your next visit. We will also order a CT scan of your head and refer you to physical therapy for balance training. Additionally, we will recheck your blood pressure and pulse while sitting and standing to assess for orthostatic hypotension.

## 2024-02-08 NOTE — Progress Notes (Signed)
 Patient ID: Robert Sparks, male    DOB: 04/30/1946  MRN: [id number]  CC: Diabetes (DM f/u. /No questions / concerns/)   Subjective: Clemon Sparks is a 78 y.o. male who presents for chronic ds management. His concerns today include:  Patient with history of HTN, DM 2 with polyneuropathy, HL, CKD stage 3, tob dependence, EtOH abuse, prostate CA, OA knee, vit B 12 def, spinal stenosis, and multiple myeloma, memory changes MMSE 25/30 2023), CTS RT hand/EMG 12/2018.    Discussed the use of AI scribe software for clinical note transcription with the patient, who gave verbal consent to proceed.  History of Present Illness Robert Sparks is a 78 year old male with diabetes, hypertension, and chronic kidney disease who presents for a four-month follow-up.  DM: Results for orders placed or performed in visit on 02/08/24  POCT glucose (manual entry)   Collection Time: 02/08/24 10:04 AM  Result Value Ref Range   POC Glucose 191 (A) 70 - 99 mg/dl  POCT glycosylated hemoglobin (Hb A1C)   Collection Time: 02/08/24 10:14 AM  Result Value Ref Range   Hemoglobin A1C     HbA1c POC (<> result, manual entry)     HbA1c, POC (prediabetic range)     HbA1c, POC (controlled diabetic range) 7.1 (A) 0.0 - 7.0 %   *Note: Due to a large number of results and/or encounters for the requested time period, some results have not been displayed. A complete set of results can be found in Results Review.  His A1c has increased from 6.8% to 7.1%. Blood sugar was 191 mg/dL at the clinic after consuming a small bag of potato chips for breakfast, but his morning blood sugar at home was 99 mg/dL. He is on Humalog  75/25 insulin  mix, taking 9 units twice a day, and increases to 12 units for three to five days twice a month when on Decadron  for multiple myeloma. He checks his blood sugar four to five times daily, with morning readings typically between 115-125 mg/dL. He experienced one episode of hypoglycemia two days ago,  which he managed by consuming peanut butter. Reports having had an eye exam done several weeks ago provide Dr. Octavia.  He will be having cataract surgery in the near future.  HTN: He continues to take amlodipine  10 mg daily and valsartan  40 mg daily for hypertension. No chest pain or leg swelling. He tries to limit salt intake in his diet.  CKD 3a-b: He was last seen by his kidney specialist in June and was prescribed ergocalciferol  50,000 units weekly for low vitamin D  and secondary hyperparathyroidism. He is taking this medication as directed.  Tob dep: He smokes about a third of a pack of cigarettes daily and is not consistently taking bupropion  consistently as he had indicated on last visit.  He does not give any particular reason for not taking it every day.  HL:  He is on pravastatin  for cholesterol management, with his last LDL reading at 83 mg/dL a year ago. He is due for a cholesterol check but wants to defer on blood test until next visit.  He experiences dizziness and a feeling of being off balance for several mths. The dizziness occurs occasionally and lasts for a few seconds.  He notices it when he goes from sitting to standing.  No dizziness when he turns over in bed.  No falls, but he uses a cane for support. He feels dizzy when closing his eyes while standing.  He had a slight worsening of anemia on recent CBCs that were done by his oncologist Dr. Onesimo. Hb rebound to around 9.  He also has neuropathy associated with diabetes and previous meds used to treat myeloma.  He is currently being treated for multiple myeloma and has regular follow-ups with his oncologist.     Patient Active Problem List   Diagnosis Date Noted   Drug-induced polyneuropathy (HCC) 09/07/2022   Pain in right shoulder 01/06/2022   Port-A-Cath in place 11/02/2021   Macroalbuminuric diabetic nephropathy (HCC) 08/31/2021   Multiple myeloma in relapse (HCC) 06/24/2021   Counseling regarding advance care  planning and goals of care 06/24/2021   Memory changes 04/25/2019   Hyperlipidemia associated with type 2 diabetes mellitus (HCC) 04/25/2019   Chronic bilateral low back pain without sciatica 03/14/2019   Spinal stenosis, lumbar region with neurogenic claudication 03/14/2019   History of prostate cancer 12/20/2018   Neuropathy of right hand 12/20/2018   Primary osteoarthritis of right knee 01/07/2018   Diabetic polyneuropathy associated with type 2 diabetes mellitus (HCC) 01/07/2018   Vitamin B12 deficiency 09/04/2017   Cataract of both eyes 04/12/2017   Tobacco dependence 04/12/2017   Multiple myeloma in remission (HCC) 02/23/2017   Hyperlipidemia 06/21/2016   CKD (chronic kidney disease) stage 3, GFR 30-59 ml/min (HCC) 02/23/2016   Malignant neoplasm of prostate (HCC) 02/21/2016   Controlled type 2 diabetes mellitus with stage 3 chronic kidney disease, with long-term current use of insulin  (HCC) 03/29/2015   HTN (hypertension) 07/28/2013   Anemia, chronic disease 07/26/2013   ETOH abuse 07/25/2013     Current Outpatient Medications on File Prior to Visit  Medication Sig Dispense Refill   Accu-Chek Softclix Lancets lancets Use to test blood sugar 3 times daily. 100 each 3   acetaminophen  (TYLENOL ) 500 MG tablet Take 2 tablets (1,000 mg total) by mouth every 6 (six) hours as needed. 30 tablet 0   acyclovir  (ZOVIRAX ) 400 MG tablet Take 1 tablet (400 mg total) by mouth 2 (two) times daily. 60 tablet 11   amLODipine  (NORVASC ) 10 MG tablet Take 1 tablet (10 mg total) by mouth daily. (Patient taking differently: Take 10 mg by mouth daily. Take 1/2 tablet) 90 tablet 1   aspirin  EC 81 MG tablet Take 81 mg by mouth daily. Swallow whole.     b complex vitamins capsule Take 1 capsule by mouth daily. 30 capsule 5   Blood Glucose Monitoring Suppl (ACCU-CHEK GUIDE) w/Device KIT Use to test blood sugar three times daily 1 kit 0   buPROPion  (WELLBUTRIN ) 75 MG tablet Take 1 tablet (75 mg total) by  mouth 2 (two) times daily. To reduce urge of smoking. 60 tablet 3   cetirizine  (ZYRTEC ) 5 MG tablet Take 1 tablet (5 mg total) by mouth at bedtime. 30 tablet 1   Continuous Blood Gluc Receiver (FREESTYLE LIBRE READER) DEVI Use to check blood sugar 3x daily. E11.42 1 each 0   Continuous Blood Gluc Sensor (FREESTYLE LIBRE SENSOR SYSTEM) MISC Use to check blood sugar 3 times daily. Change sensor every 2 weeks. 2 each 12   fluticasone  (FLONASE ) 50 MCG/ACT nasal spray Place 2 sprays into both nostrils daily. 16 g 2   gabapentin  (NEURONTIN ) 300 MG capsule Take 1 capsule (300 mg total) by mouth every morning AND 1 capsule (300 mg total) daily at 12 noon AND 2 capsules (600 mg total) at bedtime. Stop Lyrica . 120 capsule 11   glucose blood (ACCU-CHEK GUIDE TEST) test strip Use  to test blood sugar 3 times daily. 100 each 2   Insulin  Lispro Prot & Lispro (HUMALOG  MIX 75/25 KWIKPEN) (75-25) 100 UNIT/ML Kwikpen Inject 9 units subcutaneously with breakfast and dinner. Increase to 12 units for 3-5 days twice a month after dexamethasone /decadron . 15 mL 11   Insulin  Pen Needle (INSUPEN PEN NEEDLES) 32G X 4 MM MISC USE AS DIRECTED TWICE DAILY WITH INSULIN  100 each 1   ketorolac  (ACULAR ) 0.5 % ophthalmic solution Place 1 drop into the right eye in the morning, at noon, in the evening, and at bedtime. Start the day before surgery. 5 mL 1   Multiple Vitamin (MULTIVITAMIN WITH MINERALS) TABS tablet Take 1 tablet by mouth daily. 60 tablet 2   ofloxacin  (OCUFLOX ) 0.3 % ophthalmic solution Place 1 drop into the right eye 4 (four) times daily. Start one day before surgery. 5 mL 1   ondansetron  (ZOFRAN ) 8 MG tablet Take 1 tablet (8 mg total) by mouth 2 (two) times daily as needed (Nausea or vomiting). 30 tablet 1   pomalidomide  (POMALYST ) 2 MG capsule Take 1 capsule (2 mg total) by mouth daily. Take 1 capsule (2 mg total) daily for 21 days. Take 7 days off. Repeat cycle 21 capsule 0   potassium chloride  SA (KLOR-CON  M) 20 MEQ  tablet Take 1 tablet (20 mEq total) by mouth daily. 30 tablet 1   pravastatin  (PRAVACHOL ) 20 MG tablet Take 1 tablet (20 mg total) by mouth daily. 90 tablet 1   prednisoLONE  acetate (PRED FORTE ) 1 % ophthalmic suspension Place 1 drop into the right eye 4 (four) times daily. Start one day before surgery. 10 mL 1   prochlorperazine  (COMPAZINE ) 10 MG tablet Take 1 tablet (10 mg total) by mouth every 6 (six) hours as needed (Nausea or vomiting). 30 tablet 1   tiZANidine  (ZANAFLEX ) 4 MG tablet Take 1 tablet (4 mg total) by mouth every 8 (eight) hours as needed for muscle spasms. 90 tablet 5   valsartan  (DIOVAN ) 40 MG tablet Take 1 tablet (40 mg total) by mouth daily. STOP LISINOPRIL  90 tablet 3   valsartan  (DIOVAN ) 40 MG tablet Take 1 tablet (40 mg total) by mouth daily. STOP LISINOPRIL      Vitamin D , Ergocalciferol , (DRISDOL ) 1.25 MG (50000 UNIT) CAPS capsule Take 1 capsule (50,000 Units total) by mouth once a week. 17 capsule 3   donepezil  (ARICEPT ) 5 MG tablet TAKE 1 TABLET (5 MG TOTAL) BY MOUTH AT BEDTIME. FOR MEMORY ISSUES 30 tablet 5   furosemide  (LASIX ) 20 MG tablet Take 1 tablet (20 mg total) by mouth daily in the morning for 7 days (until 09/08/22). Save remaining tablets. (Patient not taking: Reported on 02/08/2024) 30 tablet 0   No current facility-administered medications on file prior to visit.    Allergies  Allergen Reactions   Other Other (See Comments)    Nicoderm CQ  = Bad Dreams Nicotine  gum = Bad Dreams    Lisinopril  Cough    Social History   Socioeconomic History   Marital status: Single    Spouse name: Not on file   Number of children: Not on file   Years of education: Not on file   Highest education level: Not on file  Occupational History   Not on file  Tobacco Use   Smoking status: Every Day    Current packs/day: 0.33    Average packs/day: 0.3 packs/day for 50.0 years (16.5 ttl pk-yrs)    Types: Cigarettes   Smokeless tobacco: Never   Tobacco  comments:    1  ppwk-4-5 a day   Vaping Use   Vaping status: Never Used  Substance and Sexual Activity   Alcohol use: Yes    Alcohol/week: 1.0 standard drink of alcohol    Types: 1 Cans of beer per week    Comment: a beer every now and again   Drug use: No   Sexual activity: Not Currently  Other Topics Concern   Not on file  Social History Narrative   Not on file   Social Drivers of Health   Financial Resource Strain: Low Risk  (05/11/2023)   Overall Financial Resource Strain (CARDIA)    Difficulty of Paying Living Expenses: Not hard at all  Food Insecurity: No Food Insecurity (05/11/2023)   Hunger Vital Sign    Worried About Running Out of Food in the Last Year: Never true    Ran Out of Food in the Last Year: Never true  Transportation Needs: No Transportation Needs (05/11/2023)   PRAPARE - Administrator, Civil Service (Medical): No    Lack of Transportation (Non-Medical): No  Physical Activity: Inactive (05/11/2023)   Exercise Vital Sign    Days of Exercise per Week: 0 days    Minutes of Exercise per Session: 0 min  Stress: No Stress Concern Present (05/11/2023)   [employer] of Occupational Health - Occupational Stress Questionnaire    Feeling of Stress : Not at all  Social Connections: Moderately Isolated (05/11/2023)   Social Connection and Isolation Panel    Frequency of Communication with Friends and Family: Three times a week    Frequency of Social Gatherings with Friends and Family: Twice a week    Attends Religious Services: Never    Database administrator or Organizations: Yes    Attends Engineer, structural: More than 4 times per year    Marital Status: Never married  Intimate Partner Violence: Not At Risk (05/11/2023)   Humiliation, Afraid, Rape, and Kick questionnaire    Fear of Current or Ex-Partner: No    Emotionally Abused: No    Physically Abused: No    Sexually Abused: No    Family History  Problem Relation Age of Onset   Kidney disease  Mother    Cancer Neg Hx    Colon cancer Neg Hx    Colon polyps Neg Hx    Esophageal cancer Neg Hx    Rectal cancer Neg Hx    Stomach cancer Neg Hx     Past Surgical History:  Procedure Laterality Date   CIRCUMCISION  1990's   PROSTATE BIOPSY N/A 12/21/2015   Procedure: BIOPSY TRANSRECTAL ULTRASONIC PROSTATE (TUBP);  Surgeon: Donnice Brooks, MD;  Location: [facility];  Service: Urology;  Laterality: N/A;    ROS: Review of Systems Negative except as stated above  PHYSICAL EXAM: BP (!) 149/73 (BP Location: Left Arm, Patient Position: Standing, Cuff Size: Normal)   Pulse 64   Temp (!) 97.5 F (36.4 C) (Oral)   Ht 5' 6 (1.676 m)   Wt 162 lb (73.5 kg)   SpO2 100%   BMI 26.15 kg/m   Physical Exam Lying down: BP 121/64, pulse of 68 Standing: BP 125/68, P of 63  General appearance - alert, well appearing, and in no distress Mental status - normal mood, behavior, speech, dress, motor activity, and thought processes Mouth - mucous membranes moist, pharynx normal without lesions Neck - supple, no significant adenopathy Chest - clear to auscultation, no  wheezes, rales or rhonchi, symmetric air entry Heart - normal rate, regular rhythm, normal S1, S2, no murmurs, rubs, clicks or gallops Neurological - cranial nerves II through XII intact, motor and sensory grossly normal bilaterally.  Gait is slow with low foot to floor clearance.  He ambulates with a cane.  Romberg is positive. Extremities - peripheral pulses normal, no pedal edema, no clubbing or cyanosis     Latest Ref Rng & Units 01/30/2024    1:19 PM 01/02/2024   11:39 AM 12/05/2023    9:20 AM  CMP  Glucose 70 - 99 mg/dL 801  839  829   BUN 8 - 23 mg/dL 19  15  11    Creatinine 0.61 - 1.24 mg/dL 8.47  8.82  8.68   Sodium 135 - 145 mmol/L 139  140  138   Potassium 3.5 - 5.1 mmol/L 4.1  3.1  3.4   Chloride 98 - 111 mmol/L 108  106  104   CO2 22 - 32 mmol/L 26  25  26    Calcium  8.9 - 10.3 mg/dL 9.3  9.5  9.1    Total Protein 6.5 - 8.1 g/dL 6.1  6.0  6.1   Total Bilirubin 0.0 - 1.2 mg/dL 0.4  0.8  0.8   Alkaline Phos 38 - 126 U/L 67  51  43   AST 15 - 41 U/L 19  12  11    ALT 0 - 44 U/L 24  8  8     Lipid Panel     Component Value Date/Time   CHOL 199 09/07/2022 1008   TRIG 116 09/07/2022 1008   HDL 96 09/07/2022 1008   CHOLHDL 2.1 09/07/2022 1008   CHOLHDL 3.6 06/21/2016 1047   VLDL 50 (H) 06/21/2016 1047   LDLCALC 83 09/07/2022 1008    CBC    Component Value Date/Time   WBC 3.6 (L) 01/30/2024 1319   WBC 5.6 07/13/2021 0741   RBC 3.04 (L) 01/30/2024 1319   HGB 9.6 (L) 01/30/2024 1319   HGB 12.2 (L) 12/06/2017 0920   HGB 10.1 (L) 07/13/2017 0938   HCT 29.8 (L) 01/30/2024 1319   HCT 37.1 (L) 12/06/2017 0920   HCT 30.6 (L) 07/13/2017 0938   PLT 211 01/30/2024 1319   PLT 250 12/06/2017 0920   MCV 98.0 01/30/2024 1319   MCV 98 (H) 12/06/2017 0920   MCV 96.2 07/13/2017 0938   MCH 31.6 01/30/2024 1319   MCHC 32.2 01/30/2024 1319   RDW 16.3 (H) 01/30/2024 1319   RDW 14.1 12/06/2017 0920   RDW 14.4 07/13/2017 0938   LYMPHSABS 1.5 01/30/2024 1319   LYMPHSABS 0.8 (L) 07/13/2017 0938   MONOABS 0.5 01/30/2024 1319   MONOABS 0.4 07/13/2017 0938   EOSABS 0.3 01/30/2024 1319   EOSABS 0.5 07/13/2017 0938   EOSABS 0.8 (H) 11/21/2016 1416   BASOSABS 0.1 01/30/2024 1319   BASOSABS 0.0 07/13/2017 0938    ASSESSMENT AND PLAN: 1. Type 2 diabetes mellitus with peripheral neuropathy (HCC) (Primary) Close to goal.  He will continue Humalog  75/25 9 units twice a day with increased to 12 units for 3 to 5 days after receiving Decadron . Encourage healthy eating habits and healthier snacks. Advised to purchase some glucose tablets and always keep with him to chew on if and when he has low blood sugar episodes. We spoke about CGM.  We have tried for this in the past and it was not approved by his insurance. - POCT glycosylated hemoglobin (Hb A1C) -  POCT glucose (manual entry)  2. Insulin   long-term use (HCC) See #1 above  3. Hypertension associated with type 2 diabetes mellitus (HCC) At goal.  Continue Norvasc  10 mg daily and Diovan  40 mg daily  4. Hyperlipidemia associated with type 2 diabetes mellitus (HCC) Continue pravastatin .  We will plan to recheck lipid profile at next visit  5. Tobacco dependence Advised to quit.  Patient does not seem committed to trying to quit.  6. Multiple myeloma in relapse [facility]) Being managed by his oncologist  7. Stage 3a chronic kidney disease (HCC) Stable.  Most recent GFR was 47  8. Dizziness Advised to stay hydrated and go slow with position changes. Anemia may be playing a role. - CT HEAD WO CONTRAST ( ); Future  9. Balance disorder Will refer to physical therapy for some safety/gait training. - CT HEAD WO CONTRAST ( ); Future - Ambulatory referral to Physical Therapy  Patient was given the opportunity to ask questions.  Patient verbalized understanding of the plan and was able to repeat key elements of the plan.   This documentation was completed using Paediatric nurse.  Any transcriptional errors are unintentional.  Orders Placed This Encounter  Procedures   CT HEAD WO CONTRAST ( )   Ambulatory referral to Physical Therapy   POCT glycosylated hemoglobin (Hb A1C)   POCT glucose (manual entry)     Requested Prescriptions    No prescriptions requested or ordered in this encounter    Return in about 4 months (around 06/09/2024) for Medicare Wellness Visit in 1-6  wks with CMA or LPN.  Barnie Louder, MD, FACP

## 2024-02-09 ENCOUNTER — Other Ambulatory Visit: Payer: Self-pay

## 2024-02-15 DIAGNOSIS — H25811 Combined forms of age-related cataract, right eye: Secondary | ICD-10-CM | POA: Diagnosis not present

## 2024-02-18 ENCOUNTER — Other Ambulatory Visit: Payer: Self-pay

## 2024-02-19 ENCOUNTER — Other Ambulatory Visit: Payer: Self-pay | Admitting: Internal Medicine

## 2024-02-19 ENCOUNTER — Other Ambulatory Visit: Payer: Self-pay

## 2024-02-19 DIAGNOSIS — E1142 Type 2 diabetes mellitus with diabetic polyneuropathy: Secondary | ICD-10-CM

## 2024-02-20 ENCOUNTER — Other Ambulatory Visit: Payer: Self-pay

## 2024-02-20 MED ORDER — GABAPENTIN 300 MG PO CAPS
ORAL_CAPSULE | ORAL | 11 refills | Status: DC
  Filled 2024-02-20: qty 120, 30d supply, fill #0
  Filled 2024-03-31: qty 120, 30d supply, fill #1
  Filled 2024-05-12: qty 120, 30d supply, fill #2
  Filled 2024-06-23: qty 120, 30d supply, fill #3

## 2024-02-22 ENCOUNTER — Ambulatory Visit: Attending: Internal Medicine | Admitting: Physical Therapy

## 2024-02-22 ENCOUNTER — Other Ambulatory Visit: Payer: Self-pay

## 2024-02-22 ENCOUNTER — Other Ambulatory Visit: Payer: Self-pay | Admitting: Internal Medicine

## 2024-02-22 NOTE — Therapy (Incomplete)
 OUTPATIENT PHYSICAL THERAPY NEURO EVALUATION   Patient Name: Robert Sparks MRN: [id number] DOB:03-01-1946, 78 y.o., male 68 Date: 02/22/2024   PCP: PIERRETTE REFERRING PROVIDER: Vicci Barnie NOVAK, MD  END OF SESSION:   Past Medical History:  Diagnosis Date   Anemia    Arthritis    shoulders,feet    Cataract    per eye dr -has appt 5-11   CKD (chronic kidney disease), stage III (HCC)    Elevated PSA    History of ketoacidosis    03-29-2015    History of sepsis    07-25-2013  non-compliant w/ medication   Hyperlipidemia    Hypertension    Nocturia    Peripheral neuropathy    Prostate cancer (HCC)    Type 2 diabetes mellitus with insulin  therapy [facility])    Past Surgical History:  Procedure Laterality Date   CIRCUMCISION  1990's   PROSTATE BIOPSY N/A 12/21/2015   Procedure: BIOPSY TRANSRECTAL ULTRASONIC PROSTATE (TUBP);  Surgeon: Donnice Brooks, MD;  Location: [facility];  Service: Urology;  Laterality: N/A;   Patient Active Problem List   Diagnosis Date Noted   Drug-induced polyneuropathy (HCC) 09/07/2022   Pain in right shoulder 01/06/2022   Port-A-Cath in place 11/02/2021   Macroalbuminuric diabetic nephropathy (HCC) 08/31/2021   Multiple myeloma in relapse (HCC) 06/24/2021   Counseling regarding advance care planning and goals of care 06/24/2021   Memory changes 04/25/2019   Hyperlipidemia associated with type 2 diabetes mellitus (HCC) 04/25/2019   Chronic bilateral low back pain without sciatica 03/14/2019   Spinal stenosis, lumbar region with neurogenic claudication 03/14/2019   History of prostate cancer 12/20/2018   Neuropathy of right hand 12/20/2018   Primary osteoarthritis of right knee 01/07/2018   Diabetic polyneuropathy associated with type 2 diabetes mellitus (HCC) 01/07/2018   Vitamin B12 deficiency 09/04/2017   Cataract of both eyes 04/12/2017   Tobacco dependence 04/12/2017   Multiple myeloma in remission (HCC) 02/23/2017    Hyperlipidemia 06/21/2016   CKD (chronic kidney disease) stage 3, GFR 30-59 ml/min (HCC) 02/23/2016   Malignant neoplasm of prostate (HCC) 02/21/2016   Controlled type 2 diabetes mellitus with stage 3 chronic kidney disease, with long-term current use of insulin  (HCC) 03/29/2015   HTN (hypertension) 07/28/2013   Anemia, chronic disease 07/26/2013   ETOH abuse 07/25/2013    ONSET DATE: 02/08/2024 (referral date)  REFERRING DIAG: R26.89 (ICD-10-CM) - Balance disorder  THERAPY DIAG:  No diagnosis found.  Rationale for Evaluation and Treatment: Rehabilitation  SUBJECTIVE:  SUBJECTIVE STATEMENT: *** Pt accompanied by: {accompnied:27141}  PERTINENT HISTORY: PMH: HTN, DM 2 with polyneuropathy, HL, CKD stage 3, tob dependence, EtOH abuse, prostate CA, OA knee, vit B 12 def, spinal stenosis, and multiple myeloma, memory changes MMSE 25/30 2023), CTS RT hand/EMG 12/2018.   PAIN:  Are you having pain? {OPRCPAIN:27236}  PRECAUTIONS: {Therapy precautions:24002}  RED FLAGS: {PT Red Flags:29287}   WEIGHT BEARING RESTRICTIONS: {Yes ***/No:24003}  FALLS: Has patient fallen in last 6 months? {fallsyesno:27318}  LIVING ENVIRONMENT: Lives with: {OPRC lives with:25569::lives with their family} Lives in: {Lives in:25570} Stairs: {opstairs:27293} Has following equipment at home: {Assistive devices:23999}  PLOF: {PLOF:24004}  PATIENT GOALS: ***  OBJECTIVE:  Note: Objective measures were completed at Evaluation unless otherwise noted.  DIAGNOSTIC FINDINGS: ***Head CT ordered, not scheduled yet  COGNITION: Overall cognitive status: {cognition:24006}   SENSATION: {sensation:27233}  COORDINATION: ***  EDEMA:  {edema:24020}  MUSCLE TONE: {LE tone:25568}  MUSCLE LENGTH: Hamstrings: Right ***  deg; Left *** deg Thomas test: Right *** deg; Left *** deg  DTRs:  {DTR SITE:24025}  POSTURE: {posture:25561}  LOWER EXTREMITY ROM:     {AROM/PROM:27142}  Right Eval Left Eval  Hip flexion    Hip extension    Hip abduction    Hip adduction    Hip internal rotation    Hip external rotation    Knee flexion    Knee extension    Ankle dorsiflexion    Ankle plantarflexion    Ankle inversion    Ankle eversion     (Blank rows = not tested)  LOWER EXTREMITY MMT:    MMT Right Eval Left Eval  Hip flexion    Hip extension    Hip abduction    Hip adduction    Hip internal rotation    Hip external rotation    Knee flexion    Knee extension    Ankle dorsiflexion    Ankle plantarflexion    Ankle inversion    Ankle eversion    (Blank rows = not tested)  BED MOBILITY:  {bed mobility:32615:p}  TRANSFERS: {transfers eval:32620}  RAMP:  {ramp eval:32616}  CURB:  {curb eval:32617}  STAIRS: {stairs eval:32618} GAIT: Findings: {GaitneuroPT:32644::Distance walked: ***,Comments: ***}  FUNCTIONAL TESTS:  {Functional tests:24029}  PATIENT SURVEYS:  {rehab surveys:24030}                                                                                                                              TREATMENT DATE: *** PT Evaluation   PATIENT EDUCATION: Education details: *** Person educated: {Person educated:25204} Education method: {Education Method:25205} Education comprehension: {Education Comprehension:25206}  HOME EXERCISE PROGRAM: ***  GOALS: Goals reviewed with patient? {yes/no:20286}  SHORT TERM GOALS: Target date: ***  *** Baseline: Goal status: INITIAL  2.  *** Baseline:  Goal status: INITIAL  3.  *** Baseline:  Goal status: INITIAL  4.  *** Baseline:  Goal status: INITIAL  5.  *** Baseline:  Goal status: INITIAL  6.  *** Baseline:  Goal status: INITIAL  LONG TERM GOALS: Target date: ***  *** Baseline:  Goal status:  INITIAL  2.  *** Baseline:  Goal status: INITIAL  3.  *** Baseline:  Goal status: INITIAL  4.  *** Baseline:  Goal status: INITIAL  5.  *** Baseline:  Goal status: INITIAL  6.  *** Baseline:  Goal status: INITIAL  ASSESSMENT:  CLINICAL IMPRESSION: Patient is a *** year old *** referred to Neuro OPPT for***.   Pt's PMH is significant for: *** The following deficits were present during the exam: ***. Based on ***, pt is an incr risk for falls. Pt would benefit from skilled PT to address these impairments and functional limitations to maximize functional mobility independence.   OBJECTIVE IMPAIRMENTS: {opptimpairments:25111}.   ACTIVITY LIMITATIONS: {activitylimitations:27494}  PARTICIPATION LIMITATIONS: {participationrestrictions:25113}  PERSONAL FACTORS: {Personal factors:25162} are also affecting patient's functional outcome.   REHAB POTENTIAL: {rehabpotential:25112}  CLINICAL DECISION MAKING: {clinical decision making:25114}  EVALUATION COMPLEXITY: {Evaluation complexity:25115}  PLAN:  PT FREQUENCY: {rehab frequency:25116}  PT DURATION: {rehab duration:25117}  PLANNED INTERVENTIONS: {rehab planned interventions:25118::97110-Therapeutic exercises,97530- Therapeutic [phone]- Neuromuscular re-education,97535- Self Rjmz,02859- Manual therapy}  PLAN FOR NEXT SESSION: ***   Waddell Southgate, PT Waddell Southgate, PT, DPT, CSRS  02/22/2024, 7:34 AM

## 2024-02-25 ENCOUNTER — Ambulatory Visit (INDEPENDENT_AMBULATORY_CARE_PROVIDER_SITE_OTHER): Admitting: Podiatry

## 2024-02-25 ENCOUNTER — Other Ambulatory Visit: Payer: Self-pay

## 2024-02-25 ENCOUNTER — Encounter: Payer: Self-pay | Admitting: Podiatry

## 2024-02-25 DIAGNOSIS — L84 Corns and callosities: Secondary | ICD-10-CM

## 2024-02-25 DIAGNOSIS — M79675 Pain in left toe(s): Secondary | ICD-10-CM | POA: Diagnosis not present

## 2024-02-25 DIAGNOSIS — E1142 Type 2 diabetes mellitus with diabetic polyneuropathy: Secondary | ICD-10-CM

## 2024-02-25 DIAGNOSIS — M79674 Pain in right toe(s): Secondary | ICD-10-CM

## 2024-02-25 DIAGNOSIS — N1831 Chronic kidney disease, stage 3a: Secondary | ICD-10-CM

## 2024-02-25 DIAGNOSIS — B351 Tinea unguium: Secondary | ICD-10-CM | POA: Diagnosis not present

## 2024-02-25 NOTE — Progress Notes (Signed)
 This patient presents to the office with chief complaint of long thick painful nails.  Patient says the nails are painful walking and wearing shoes.  This patient is unable to self treat.  This patient is unable to trim his nails since he is unable to reach his nails.  She presents to the office for preventative foot care services.  General Appearance  Alert, conversant and in no acute stress.  Vascular  Dorsalis pedis and posterior tibial  pulses are palpable  bilaterally.  Capillary return is within normal limits  bilaterally. Temperature is within normal limits  bilaterally.  Neurologic  Senn-Weinstein monofilament wire test within normal limits  bilaterally. Muscle power within normal limits bilaterally.  Nails Thick disfigured discolored nails with subungual debris  from hallux to fifth toes bilaterally. No evidence of bacterial infection or drainage bilaterally.  Orthopedic  No limitations of motion  feet .  No crepitus or effusions noted.  No bony pathology or digital deformities noted.  Skin  normotropic skin with no porokeratosis noted bilaterally.  No signs of infections or ulcers noted.   Callus plantar right hallux.  Onychomycosis  Nails  B/L.  Pain in right toes  Pain in left toes  Callus right hallux.  Debridement of nails both feet followed trimming the nails with dremel tool.  Debride callus with # 15 blade and dremel tool.  Debride callus with # 15 blade.  RTC 9   weeks    Cordella Bold DPM

## 2024-02-26 ENCOUNTER — Other Ambulatory Visit: Payer: Self-pay

## 2024-02-26 ENCOUNTER — Other Ambulatory Visit: Payer: Self-pay | Admitting: Internal Medicine

## 2024-02-27 ENCOUNTER — Inpatient Hospital Stay (HOSPITAL_BASED_OUTPATIENT_CLINIC_OR_DEPARTMENT_OTHER): Admitting: Hematology

## 2024-02-27 ENCOUNTER — Other Ambulatory Visit: Payer: Self-pay

## 2024-02-27 ENCOUNTER — Inpatient Hospital Stay

## 2024-02-27 ENCOUNTER — Inpatient Hospital Stay: Attending: Hematology

## 2024-02-27 VITALS — BP 138/63 | HR 64 | Temp 97.7°F | Resp 18 | Wt 164.0 lb

## 2024-02-27 DIAGNOSIS — C9002 Multiple myeloma in relapse: Secondary | ICD-10-CM

## 2024-02-27 DIAGNOSIS — Z7189 Other specified counseling: Secondary | ICD-10-CM

## 2024-02-27 DIAGNOSIS — Z5112 Encounter for antineoplastic immunotherapy: Secondary | ICD-10-CM | POA: Diagnosis not present

## 2024-02-27 DIAGNOSIS — Z5111 Encounter for antineoplastic chemotherapy: Secondary | ICD-10-CM

## 2024-02-27 LAB — CMP (CANCER CENTER ONLY)
ALT: 24 U/L (ref 0–44)
AST: 24 U/L (ref 15–41)
Albumin: 3.7 g/dL (ref 3.5–5.0)
Alkaline Phosphatase: 102 U/L (ref 38–126)
Anion gap: 4 — ABNORMAL LOW (ref 5–15)
BUN: 16 mg/dL (ref 8–23)
CO2: 27 mmol/L (ref 22–32)
Calcium: 8.6 mg/dL — ABNORMAL LOW (ref 8.9–10.3)
Chloride: 109 mmol/L (ref 98–111)
Creatinine: 1.23 mg/dL (ref 0.61–1.24)
GFR, Estimated: >60 mL/min (ref >60)
Glucose, Bld: 180 mg/dL — ABNORMAL HIGH (ref 70–99)
Potassium: 3.5 mmol/L (ref 3.5–5.1)
Sodium: 140 mmol/L (ref 135–145)
Total Bilirubin: 0.4 mg/dL (ref 0.0–1.2)
Total Protein: 5.7 g/dL — ABNORMAL LOW (ref 6.5–8.1)

## 2024-02-27 LAB — CBC WITH DIFFERENTIAL (CANCER CENTER ONLY)
Abs Immature Granulocytes: 0 K/uL (ref 0.00–0.07)
Basophils Absolute: 0.1 K/uL (ref 0.0–0.1)
Basophils Relative: 2 %
Eosinophils Absolute: 0.2 K/uL (ref 0.0–0.5)
Eosinophils Relative: 6 %
HCT: 28.8 % — ABNORMAL LOW (ref 39.0–52.0)
Hemoglobin: 9.6 g/dL — ABNORMAL LOW (ref 13.0–17.0)
Immature Granulocytes: 0 %
Lymphocytes Relative: 36 %
Lymphs Abs: 1.2 K/uL (ref 0.7–4.0)
MCH: 31.3 pg (ref 26.0–34.0)
MCHC: 33.3 g/dL (ref 30.0–36.0)
MCV: 93.8 fL (ref 80.0–100.0)
Monocytes Absolute: 0.4 K/uL (ref 0.1–1.0)
Monocytes Relative: 12 %
Neutro Abs: 1.5 K/uL — ABNORMAL LOW (ref 1.7–7.7)
Neutrophils Relative %: 44 %
Platelet Count: 195 K/uL (ref 150–400)
RBC: 3.07 MIL/uL — ABNORMAL LOW (ref 4.22–5.81)
RDW: 15.7 % — ABNORMAL HIGH (ref 11.5–15.5)
WBC Count: 3.4 K/uL — ABNORMAL LOW (ref 4.0–10.5)
nRBC: 0 % (ref 0.0–0.2)

## 2024-02-27 MED ORDER — DARATUMUMAB-HYALURONIDASE-FIHJ 1800-30000 MG-UT/15ML ~~LOC~~ SOLN
1800.0000 mg | Freq: Once | SUBCUTANEOUS | Status: AC
  Administered 2024-02-27: 1800 mg via SUBCUTANEOUS
  Filled 2024-02-27: qty 15

## 2024-02-27 MED ORDER — DEXAMETHASONE 4 MG PO TABS
20.0000 mg | ORAL_TABLET | Freq: Once | ORAL | Status: AC
  Administered 2024-02-27: 20 mg via ORAL
  Filled 2024-02-27: qty 5

## 2024-02-27 MED ORDER — FAMOTIDINE 20 MG PO TABS
20.0000 mg | ORAL_TABLET | Freq: Once | ORAL | Status: AC
  Administered 2024-02-27: 20 mg via ORAL
  Filled 2024-02-27: qty 1

## 2024-02-27 MED ORDER — ACETAMINOPHEN 325 MG PO TABS
650.0000 mg | ORAL_TABLET | Freq: Once | ORAL | Status: AC
  Administered 2024-02-27: 650 mg via ORAL
  Filled 2024-02-27: qty 2

## 2024-02-27 MED ORDER — POMALIDOMIDE 2 MG PO CAPS: 2.0000 mg | ORAL_CAPSULE | Freq: Every day | ORAL | 0 refills | Status: DC

## 2024-02-27 MED ORDER — DIPHENHYDRAMINE HCL 25 MG PO CAPS
50.0000 mg | ORAL_CAPSULE | Freq: Once | ORAL | Status: AC
  Administered 2024-02-27: 50 mg via ORAL
  Filled 2024-02-27: qty 2

## 2024-02-27 NOTE — Telephone Encounter (Signed)
 Requested medication (s) are due for refill today: na  Requested medication (s) are on the active medication list: yes   Last refill:  02/28/23 #30 1 refills  Future visit scheduled: yes in 3 months   Notes to clinic:  Rx last ordered by Porter Candida Saran, MD 02/28/23. Do you want to order/ refill Rx?     Requested Prescriptions  Pending Prescriptions Disp Refills   potassium chloride  SA (KLOR-CON  M) 20 MEQ tablet 30 tablet 1    Sig: Take 1 tablet (20 mEq total) by mouth daily.     Endocrinology:  Minerals - Potassium Supplementation Passed - 02/27/2024  4:20 PM      Passed - K in normal range and within 360 days    Potassium  Date Value Ref Range Status  02/27/2024 3.5 3.5 - 5.1 mmol/L Final  07/13/2017 3.3 (L) 3.5 - 5.1 mEq/L Final         Passed - Cr in normal range and within 360 days    Creatinine  Date Value Ref Range Status  02/27/2024 1.23 0.61 - 1.24 mg/dL Final  98/95/7980 1.7 (H) 0.7 - 1.3 mg/dL Final   Creatinine, POC  Date Value Ref Range Status  11/21/2016 100 mg/dL Final   Creatinine, Urine  Date Value Ref Range Status  07/25/2013 39.31 mg/dL Final         Passed - Valid encounter within last 12 months    Recent Outpatient Visits           2 weeks ago Type 2 diabetes mellitus with peripheral neuropathy (HCC)   [facility] Dept Of [place]. [facility] Vicci Sober B, MD   5 months ago Type 2 diabetes mellitus with peripheral neuropathy [facility])   [facility] Dept Of [place]. [facility] Vicci Sober B, MD   9 months ago Type 2 diabetes mellitus with peripheral neuropathy [facility])   [facility] - A Dept Of [place]. [facility] Vicci Sober NOVAK, MD   1 year ago Type 2 diabetes mellitus with peripheral neuropathy [facility])   [facility] A Dept Of [place]. [facility] Vicci Sober NOVAK, MD   1 year ago Type 2 diabetes  mellitus with peripheral neuropathy [facility])   [facility] - A Dept Of [place]. [facility] Vicci Sober NOVAK, MD       Future Appointments             In 3 months Vicci Sober NOVAK, MD [facility] - A Dept Of [place]. [facility]

## 2024-02-27 NOTE — Patient Instructions (Signed)
 [facility] - A DEPT OF [facility]  Discharge Instructions: Thank you for choosing [facility] to provide your oncology and hematology care.   If you have a lab appointment with the Cancer Center, please go directly to the Cancer Center and check in at the registration area.   Wear comfortable clothing and clothing appropriate for easy access to any Portacath or PICC line.   We strive to give you quality time with your provider. You may need to reschedule your appointment if you arrive late (15 or more minutes).  Arriving late affects you and other patients whose appointments are after yours.  Also, if you miss three or more appointments without notifying the office, you may be dismissed from the clinic at the provider's discretion.      For prescription refill requests, have your pharmacy contact our office and allow 72 hours for refills to be completed.    Today you received the following chemotherapy and/or immunotherapy agents: Dara Faspro.       To help prevent nausea and vomiting after your treatment, we encourage you to take your nausea medication as directed.  BELOW ARE SYMPTOMS THAT SHOULD BE REPORTED IMMEDIATELY: *FEVER GREATER THAN 100.4 F (38 C) OR HIGHER *CHILLS OR SWEATING *NAUSEA AND VOMITING THAT IS NOT CONTROLLED WITH YOUR NAUSEA MEDICATION *UNUSUAL SHORTNESS OF BREATH *UNUSUAL BRUISING OR BLEEDING *URINARY PROBLEMS (pain or burning when urinating, or frequent urination) *BOWEL PROBLEMS (unusual diarrhea, constipation, pain near the anus) TENDERNESS IN MOUTH AND THROAT WITH OR WITHOUT PRESENCE OF ULCERS (sore throat, sores in mouth, or a toothache) UNUSUAL RASH, SWELLING OR PAIN  UNUSUAL VAGINAL DISCHARGE OR ITCHING   Items with * indicate a potential emergency and should be followed up as soon as possible or go to the Emergency Department if any problems should occur.  Please show the CHEMOTHERAPY ALERT CARD or  IMMUNOTHERAPY ALERT CARD at check-in to the Emergency Department and triage nurse.  Should you have questions after your visit or need to cancel or reschedule your appointment, please contact [facility]  Dept: [phone]  and follow the prompts.  Office hours are 8:00 a.m. to 4:30 p.m. Monday - Friday. Please note that voicemails left after 4:00 p.m. may not be returned until the following business day.  We are closed weekends and major holidays. You have access to a nurse at all times for urgent questions. Please call the main number to the clinic Dept: [phone] and follow the prompts.   For any non-urgent questions, you may also contact your provider using MyChart. We now offer e-Visits for anyone 12 and older to request care online for non-urgent symptoms. For details visit [email or web address].   Also download the MyChart app! Go to the app store, search "MyChart", open the app, select , and log in with your MyChart username and password.

## 2024-02-28 ENCOUNTER — Other Ambulatory Visit: Payer: Self-pay | Admitting: Hematology

## 2024-02-28 ENCOUNTER — Other Ambulatory Visit: Payer: Self-pay

## 2024-02-28 LAB — KAPPA/LAMBDA LIGHT CHAINS
Kappa free light chain: 57.6 mg/L — ABNORMAL HIGH (ref 3.3–19.4)
Kappa, lambda light chain ratio: 1.94 — ABNORMAL HIGH (ref 0.26–1.65)
Lambda free light chains: 29.7 mg/L — ABNORMAL HIGH (ref 5.7–26.3)

## 2024-02-28 MED ORDER — POTASSIUM CHLORIDE CRYS ER 20 MEQ PO TBCR
20.0000 meq | EXTENDED_RELEASE_TABLET | Freq: Every day | ORAL | 1 refills | Status: DC
  Filled 2024-02-28: qty 30, 30d supply, fill #0

## 2024-02-29 ENCOUNTER — Other Ambulatory Visit: Payer: Self-pay

## 2024-03-02 LAB — MULTIPLE MYELOMA PANEL, SERUM
Albumin SerPl Elph-Mcnc: 3.4 g/dL (ref 2.9–4.4)
Albumin/Glob SerPl: 1.8 — ABNORMAL HIGH (ref 0.7–1.7)
Alpha 1: 0.2 g/dL (ref 0.0–0.4)
Alpha2 Glob SerPl Elph-Mcnc: 0.6 g/dL (ref 0.4–1.0)
B-Globulin SerPl Elph-Mcnc: 0.7 g/dL (ref 0.7–1.3)
Gamma Glob SerPl Elph-Mcnc: 0.5 g/dL (ref 0.4–1.8)
Globulin, Total: 2 g/dL — ABNORMAL LOW (ref 2.2–3.9)
IgA: 112 mg/dL (ref 61–437)
IgG (Immunoglobin G), Serum: 596 mg/dL — ABNORMAL LOW (ref 603–1613)
IgM (Immunoglobulin M), Srm: 24 mg/dL (ref 15–143)
Total Protein ELP: 5.4 g/dL — ABNORMAL LOW (ref 6.0–8.5)

## 2024-03-04 NOTE — Progress Notes (Signed)
 HEMATOLOGY/ONCOLOGY CLINIC NOTE  Date of Service: .02/27/2024  Patient Care Team: Vicci Barnie NOVAK, MD as PCP - General (Internal Medicine)  CHIEF COMPLAINTS/PURPOSE OF CONSULTATION:  Follow-up for continued evaluation and management of relapsed multiple myeloma  Oncologic History:  78 y.o. male with diagnosis of IgA lambda active multiple myeloma, ISS Stage I. Active disease diagnosed based on presence of anemia, kidney insufficiency, and paraproteinemia with significant predominance of lambda light chains as well as significant elevation of IgA. Bone marrow biopsy confirmed presence of atypical monoclonal plasma cell process in the bone marrow comprising at least 7% of the cellularity. Based on the findings, patient was started on treatment with lenalidomide , bortezomib , and low-dose dexamethasone  based on the anticipated tolerance by the patient. Lenalidomide  was started at 10 mg daily based on creatinine clearance of 42, dexamethasone  dose was reduced to 20 mg weekly based on patient's age. Treatment was complicated by rapid development of cutaneous rash attributed to lenalidomide , inaddition to decreased renal function and now patient has persistent severe anemia. Side-effects were attributed to Lenalidomide  and lenalidomide  was discontinued with subsequent recovery.  Patient has been receiving bortezomib  weekly with low-dose dexamethasone  in 4-day cycles.   Patient has completed induction systemic therapy for his disease with repeat bone marrow biopsy confirming complete response including no evidence of minimal residual disease by cytogenetics or FISH.  HISTORY OF PRESENTING ILLNESS:  Please see previous note for details of initial presentation.  [name] is a 78 y.o. male is here for continued evaluation and management of his IgA multiple myeloma. Patient notes no acute new symptoms since his last clinic visit.  He is tolerating his daratumumab   Faspro and pomalidomide  and dexamethasone  without any acute new concerns. No infection issues. No new fatigue.  No bleeding issues. No significant new diarrhea. No other acute new medical concerns.  MEDICAL HISTORY:  Past Medical History:  Diagnosis Date   Anemia    Arthritis    shoulders,feet    Cataract    per eye dr -has appt 5-11   CKD (chronic kidney disease), stage III (HCC)    Elevated PSA    History of ketoacidosis    03-29-2015    History of sepsis    07-25-2013  non-compliant w/ medication   Hyperlipidemia    Hypertension    Nocturia    Peripheral neuropathy    Prostate cancer (HCC)    Type 2 diabetes mellitus with insulin  therapy [facility])     SURGICAL HISTORY: Past Surgical History:  Procedure Laterality Date   CIRCUMCISION  1990's   PROSTATE BIOPSY N/A 12/21/2015   Procedure: BIOPSY TRANSRECTAL ULTRASONIC PROSTATE (TUBP);  Surgeon: Donnice Brooks, MD;  Location: [facility];  Service: Urology;  Laterality: N/A;    SOCIAL HISTORY: Social History   Socioeconomic History   Marital status: Single    Spouse name: Not on file   Number of children: Not on file   Years of education: Not on file   Highest education level: Not on file  Occupational History   Not on file  Tobacco Use   Smoking status: Every Day    Current packs/day: 0.33    Average packs/day: 0.3 packs/day for 50.0 years (16.5 ttl pk-yrs)    Types: Cigarettes   Smokeless tobacco: Never   Tobacco comments:    1 ppwk-4-5 a day   Vaping Use   Vaping status: Never Used  Substance and Sexual Activity   Alcohol use:  Yes    Alcohol/week: 1.0 standard drink of alcohol    Types: 1 Cans of beer per week    Comment: a beer every now and again   Drug use: No   Sexual activity: Not Currently  Other Topics Concern   Not on file  Social History Narrative   Not on file   Social Drivers of Health   Financial Resource Strain: Low Risk  (05/11/2023)   Overall Financial Resource Strain  (CARDIA)    Difficulty of Paying Living Expenses: Not hard at all  Food Insecurity: No Food Insecurity (05/11/2023)   Hunger Vital Sign    Worried About Running Out of Food in the Last Year: Never true    Ran Out of Food in the Last Year: Never true  Transportation Needs: No Transportation Needs (05/11/2023)   PRAPARE - Administrator, Civil Service (Medical): No    Lack of Transportation (Non-Medical): No  Physical Activity: Inactive (05/11/2023)   Exercise Vital Sign    Days of Exercise per Week: 0 days    Minutes of Exercise per Session: 0 min  Stress: No Stress Concern Present (05/11/2023)   [employer] of Occupational Health - Occupational Stress Questionnaire    Feeling of Stress : Not at all  Social Connections: Moderately Isolated (05/11/2023)   Social Connection and Isolation Panel    Frequency of Communication with Friends and Family: Three times a week    Frequency of Social Gatherings with Friends and Family: Twice a week    Attends Religious Services: Never    Database administrator or Organizations: Yes    Attends Engineer, structural: More than 4 times per year    Marital Status: Never married  Intimate Partner Violence: Not At Risk (05/11/2023)   Humiliation, Afraid, Rape, and Kick questionnaire    Fear of Current or Ex-Partner: No    Emotionally Abused: No    Physically Abused: No    Sexually Abused: No    FAMILY HISTORY: Family History  Problem Relation Age of Onset   Kidney disease Mother    Cancer Neg Hx    Colon cancer Neg Hx    Colon polyps Neg Hx    Esophageal cancer Neg Hx    Rectal cancer Neg Hx    Stomach cancer Neg Hx     ALLERGIES:  is allergic to other and lisinopril .  MEDICATIONS:  Current Outpatient Medications  Medication Sig Dispense Refill   Accu-Chek Softclix Lancets lancets Use to test blood sugar 3 times daily. 100 each 3   acetaminophen  (TYLENOL ) 500 MG tablet Take 2 tablets (1,000 mg total) by mouth every 6  (six) hours as needed. 30 tablet 0   acyclovir  (ZOVIRAX ) 400 MG tablet Take 1 tablet (400 mg total) by mouth 2 (two) times daily. 60 tablet 11   amLODipine  (NORVASC ) 10 MG tablet Take 1 tablet (10 mg total) by mouth daily. (Patient taking differently: Take 10 mg by mouth daily. Take 1/2 tablet) 90 tablet 1   aspirin  EC 81 MG tablet Take 81 mg by mouth daily. Swallow whole.     b complex vitamins capsule Take 1 capsule by mouth daily. 30 capsule 5   Blood Glucose Monitoring Suppl (ACCU-CHEK GUIDE) w/Device KIT Use to test blood sugar three times daily 1 kit 0   buPROPion  (WELLBUTRIN ) 75 MG tablet Take 1 tablet (75 mg total) by mouth 2 (two) times daily. To reduce urge of smoking. 60 tablet 3  cetirizine  (ZYRTEC ) 5 MG tablet Take 1 tablet (5 mg total) by mouth at bedtime. 30 tablet 1   Continuous Blood Gluc Receiver (FREESTYLE LIBRE READER) DEVI Use to check blood sugar 3x daily. E11.42 1 each 0   Continuous Blood Gluc Sensor (FREESTYLE LIBRE SENSOR SYSTEM) MISC Use to check blood sugar 3 times daily. Change sensor every 2 weeks. 2 each 12   fluticasone  (FLONASE ) 50 MCG/ACT nasal spray Place 2 sprays into both nostrils daily. 16 g 2   furosemide  (LASIX ) 20 MG tablet Take 1 tablet (20 mg total) by mouth daily in the morning for 7 days (until 09/08/22). Save remaining tablets. 30 tablet 0   gabapentin  (NEURONTIN ) 300 MG capsule Take 1 capsule (300 mg total) by mouth every morning AND 1 capsule (300 mg total) daily at 12 noon AND 2 capsules (600 mg total) at bedtime. Stop Lyrica . 120 capsule 11   glucose blood (ACCU-CHEK GUIDE TEST) test strip Use to test blood sugar 3 times daily. 100 each 2   Insulin  Lispro Prot & Lispro (HUMALOG  MIX 75/25 KWIKPEN) (75-25) 100 UNIT/ML Kwikpen Inject 9 units subcutaneously with breakfast and dinner. Increase to 12 units for 3-5 days twice a month after dexamethasone /decadron . 15 mL 11   Insulin  Pen Needle (INSUPEN PEN NEEDLES) 32G X 4 MM MISC USE AS DIRECTED TWICE DAILY  WITH INSULIN  100 each 1   ketorolac  (ACULAR ) 0.5 % ophthalmic solution Place 1 drop into the right eye in the morning, at noon, in the evening, and at bedtime. Start the day before surgery. 5 mL 1   Multiple Vitamin (MULTIVITAMIN WITH MINERALS) TABS tablet Take 1 tablet by mouth daily. 60 tablet 2   ofloxacin  (OCUFLOX ) 0.3 % ophthalmic solution Place 1 drop into the right eye 4 (four) times daily. Start one day before surgery. 5 mL 1   ondansetron  (ZOFRAN ) 8 MG tablet Take 1 tablet (8 mg total) by mouth 2 (two) times daily as needed (Nausea or vomiting). 30 tablet 1   pomalidomide  (POMALYST ) 2 MG capsule Take 1 capsule (2 mg total) by mouth daily. Take 1 capsule (2 mg total) daily for 21 days. Take 7 days off. Repeat cycle 21 capsule 0   pravastatin  (PRAVACHOL ) 20 MG tablet Take 1 tablet (20 mg total) by mouth daily. 90 tablet 1   prednisoLONE  acetate (PRED FORTE ) 1 % ophthalmic suspension Place 1 drop into the right eye 4 (four) times daily. Start one day before surgery. 10 mL 1   prochlorperazine  (COMPAZINE ) 10 MG tablet Take 1 tablet (10 mg total) by mouth every 6 (six) hours as needed (Nausea or vomiting). 30 tablet 1   tiZANidine  (ZANAFLEX ) 4 MG tablet Take 1 tablet (4 mg total) by mouth every 8 (eight) hours as needed for muscle spasms. 90 tablet 5   valsartan  (DIOVAN ) 40 MG tablet Take 1 tablet (40 mg total) by mouth daily. STOP LISINOPRIL  90 tablet 3   valsartan  (DIOVAN ) 40 MG tablet Take 1 tablet (40 mg total) by mouth daily. STOP LISINOPRIL      Vitamin D , Ergocalciferol , (DRISDOL ) 1.25 MG (50000 UNIT) CAPS capsule Take 1 capsule (50,000 Units total) by mouth once a week. 17 capsule 3   donepezil  (ARICEPT ) 5 MG tablet TAKE 1 TABLET (5 MG TOTAL) BY MOUTH AT BEDTIME. FOR MEMORY ISSUES 30 tablet 5   potassium chloride  SA (KLOR-CON  M) 20 MEQ tablet Take 1 tablet (20 mEq total) by mouth daily. 30 tablet 1   No current facility-administered medications for this visit.  REVIEW OF SYSTEMS: .10  Point review of Systems was done is negative except as noted above.  PHYSICAL EXAMINATION: .BP 138/63   Pulse 64   Temp 97.7 F (36.5 C)   Resp 18   Wt 164 lb (74.4 kg)   SpO2 98%   BMI 26.47 kg/m  . GENERAL:alert, in no acute distress and comfortable SKIN: no acute rashes, no significant lesions EYES: conjunctiva are pink and non-injected, sclera anicteric OROPHARYNX: MMM, no exudates, no oropharyngeal erythema or ulceration NECK: supple, no JVD LYMPH:  no palpable lymphadenopathy in the cervical, axillary or inguinal regions LUNGS: clear to auscultation b/l with normal respiratory effort HEART: regular rate & rhythm ABDOMEN:  normoactive bowel sounds , non tender, not distended. Extremity: no pedal edema PSYCH: alert & oriented x 3 with fluent speech NEURO: no focal motor/sensory deficits   LABORATORY DATA:  I have reviewed the data as listed  .    Latest Ref Rng & Units 02/27/2024   11:30 AM 01/30/2024    1:19 PM 01/02/2024   11:39 AM  CBC  WBC 4.0 - 10.5 K/uL 3.4  3.6  3.9   Hemoglobin 13.0 - 17.0 g/dL 9.6  9.6  8.1   Hematocrit 39.0 - 52.0 % 28.8  29.8  24.2   Platelets 150 - 400 K/uL 195  211  269       Latest Ref Rng & Units 02/27/2024   11:30 AM 01/30/2024    1:19 PM 01/02/2024   11:39 AM  CMP  Glucose 70 - 99 mg/dL 819  801  839   BUN 8 - 23 mg/dL 16  19  15    Creatinine 0.61 - 1.24 mg/dL 8.76  8.47  8.82   Sodium 135 - 145 mmol/L 140  139  140   Potassium 3.5 - 5.1 mmol/L 3.5  4.1  3.1   Chloride 98 - 111 mmol/L 109  108  106   CO2 22 - 32 mmol/L 27  26  25    Calcium  8.9 - 10.3 mg/dL 8.6  9.3  9.5   Total Protein 6.5 - 8.1 g/dL 5.7  6.1  6.0   Total Bilirubin 0.0 - 1.2 mg/dL 0.4  0.4  0.8   Alkaline Phos 38 - 126 U/L 102  67  51   AST 15 - 41 U/L 24  19  12    ALT 0 - 44 U/L 24  24  8       08/29/17 BM Bx:    08/29/17 Cytogenetics:   03/13/17 Cytogenetics:        PATHOLOGY Surgical Pathology  CASE: [id number]  PATIENT: Robert Sparks   Bone Marrow Report      Clinical History: Multiple myeloma in relapse (HCC) ,(BH)      DIAGNOSIS:   BONE MARROW, ASPIRATE, CLOT, CORE:  -Hypercellular bone marrow for age with plasma cell neoplasm  -See comment   PERIPHERAL BLOOD:  -Mild normocytic-normochromic anemia  -Eosinophilia   COMMENT:   The bone marrow is hypercellular for age with increased number of plasma  cells representing 8% of all cells in the aspirate associated with  numerous small clusters in the clot and biopsy sections. The plasma  cells display lambda light chain restriction consistent with plasma cell  neoplasm.  The background shows trilineage hematopoiesis with generally  nonspecific myeloid changes, likely secondary in nature in this setting.  Correlation with cytogenetic and FISH studies is recommended.      RADIOGRAPHIC STUDIES: I have personally reviewed the  radiological images as listed and agreed with the findings in the report. No results found.  ASSESSMENT & PLAN:   78 y.o. male with   1.  Relapsed IgA Lambda Multiple Myeloma ISS Stage I    Active disease was previously diagnosed based on presence of anemia, kidney insufficiency, and paraproteinemia with significant predominance of lambda light chains as well as significant elevation of IgA. Patient is status post patient was treated with induction bortezomib  Revlimid  dexamethasone .  Had issues with skin rashes with Revlimid  and this was discontinued.  Completed bortezomib  dexamethasone  induction and achieved complete remission. Was on maintenance Velcade  2 weeks which was then switched to maintenance Ninlaro . Patient had issues with worsening neuropathy which was a combination of Velcade  and his diabetes.  Ninlaro  was discontinued as well after maintenance of more than 2 years.  2.  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 Bone marrow biopsy  shows 8% lambda restricted plasma cells PET CT scan with no hypermetabolic osseous lesions  3.  Hypertension 4.  Diabetes type 2 5 .chronic kidney disease due to hypertension and diabetes. 6.  Peripheral neuropathy related to diabetes and previous myeloma treatments.  PLAN: -Discussed lab results on 02/27/24 in detail with patient.  Stable with a hemoglobin of 9.6 normal WBC count and platelets CMP stable creatinine 1.23 down from 1.12 Myeloma panel shows no detectable M protein and IFE that is negative for monoclonal protein. Serum kappa lambda free light chain ratio 1.94 Patient has no clinical signs or symptoms suggestive of myeloma progression at this time He notes no notable toxicities from his Dara Faspro/pomalidomide /dexamethasone  and we shall continue these with the same supportive medications. -he has no clinical sign or lab evidence of myeloma progression at this time  -patient has no major toxicities from Daratumumab  or Pomalidomide .  -continue current treatment regimen with same supportive medications.  Continue aspirin  for VTE prophylaxis Continue acyclovir  for VZV and shingles prophylaxis Maintain good p.o. fluid intake of at least 2 to 3 L of water  daily   FOLLOW-UP: Per integrated scheduling MD visit in 8 weeks  The total time spent in the appointment was 30 minutes*.  All of the patient's questions were answered with apparent satisfaction. The patient knows to call the clinic with any problems, questions or concerns.   Emaline Saran MD MS AAHIVMS [facility] Hematology/Oncology Physician [facility]  .*Total Encounter Time as defined by the Centers for Medicare and Medicaid Services includes, in addition to the face-to-face time of a patient visit (documented in the note above) non-face-to-face time: obtaining and reviewing outside history, ordering and reviewing medications, tests or procedures, care coordination (communications with other health care  professionals or caregivers) and documentation in the medical record.

## 2024-03-05 ENCOUNTER — Encounter: Payer: Self-pay | Admitting: Hematology

## 2024-03-06 ENCOUNTER — Other Ambulatory Visit: Payer: Self-pay

## 2024-03-17 ENCOUNTER — Other Ambulatory Visit: Payer: Self-pay

## 2024-03-18 ENCOUNTER — Other Ambulatory Visit: Payer: Self-pay

## 2024-03-21 ENCOUNTER — Other Ambulatory Visit: Payer: Self-pay

## 2024-03-24 ENCOUNTER — Other Ambulatory Visit: Payer: Self-pay

## 2024-03-24 DIAGNOSIS — C9002 Multiple myeloma in relapse: Secondary | ICD-10-CM

## 2024-03-24 MED ORDER — POMALIDOMIDE 2 MG PO CAPS: 2.0000 mg | ORAL_CAPSULE | Freq: Every day | ORAL | 0 refills | Status: DC

## 2024-03-25 ENCOUNTER — Other Ambulatory Visit: Payer: Self-pay

## 2024-03-26 ENCOUNTER — Inpatient Hospital Stay: Attending: Hematology

## 2024-03-26 ENCOUNTER — Inpatient Hospital Stay

## 2024-03-26 VITALS — BP 153/66 | HR 58 | Temp 97.9°F | Resp 17 | Ht 66.0 in | Wt 162.2 lb

## 2024-03-26 DIAGNOSIS — Z7189 Other specified counseling: Secondary | ICD-10-CM

## 2024-03-26 DIAGNOSIS — Z5112 Encounter for antineoplastic immunotherapy: Secondary | ICD-10-CM | POA: Diagnosis not present

## 2024-03-26 DIAGNOSIS — C9002 Multiple myeloma in relapse: Secondary | ICD-10-CM | POA: Diagnosis not present

## 2024-03-26 DIAGNOSIS — H2512 Age-related nuclear cataract, left eye: Secondary | ICD-10-CM | POA: Diagnosis not present

## 2024-03-26 LAB — CBC WITH DIFFERENTIAL (CANCER CENTER ONLY)
Abs Immature Granulocytes: 0 K/uL (ref 0.00–0.07)
Basophils Absolute: 0.1 K/uL (ref 0.0–0.1)
Basophils Relative: 2 %
Eosinophils Absolute: 0.2 K/uL (ref 0.0–0.5)
Eosinophils Relative: 6 %
HCT: 31 % — ABNORMAL LOW (ref 39.0–52.0)
Hemoglobin: 10.3 g/dL — ABNORMAL LOW (ref 13.0–17.0)
Immature Granulocytes: 0 %
Lymphocytes Relative: 36 %
Lymphs Abs: 1.1 K/uL (ref 0.7–4.0)
MCH: 31.6 pg (ref 26.0–34.0)
MCHC: 33.2 g/dL (ref 30.0–36.0)
MCV: 95.1 fL (ref 80.0–100.0)
Monocytes Absolute: 0.4 K/uL (ref 0.1–1.0)
Monocytes Relative: 14 %
Neutro Abs: 1.3 K/uL — ABNORMAL LOW (ref 1.7–7.7)
Neutrophils Relative %: 42 %
Platelet Count: 216 K/uL (ref 150–400)
RBC: 3.26 MIL/uL — ABNORMAL LOW (ref 4.22–5.81)
RDW: 16.1 % — ABNORMAL HIGH (ref 11.5–15.5)
WBC Count: 3.1 K/uL — ABNORMAL LOW (ref 4.0–10.5)
nRBC: 0 % (ref 0.0–0.2)

## 2024-03-26 LAB — CMP (CANCER CENTER ONLY)
ALT: 15 U/L (ref 0–44)
AST: 13 U/L — ABNORMAL LOW (ref 15–41)
Albumin: 4.1 g/dL (ref 3.5–5.0)
Alkaline Phosphatase: 65 U/L (ref 38–126)
Anion gap: 6 (ref 5–15)
BUN: 14 mg/dL (ref 8–23)
CO2: 27 mmol/L (ref 22–32)
Calcium: 9.8 mg/dL (ref 8.9–10.3)
Chloride: 107 mmol/L (ref 98–111)
Creatinine: 1.49 mg/dL — ABNORMAL HIGH (ref 0.61–1.24)
GFR, Estimated: 48 mL/min — ABNORMAL LOW (ref >60)
Glucose, Bld: 198 mg/dL — ABNORMAL HIGH (ref 70–99)
Potassium: 3.9 mmol/L (ref 3.5–5.1)
Sodium: 140 mmol/L (ref 135–145)
Total Bilirubin: 0.5 mg/dL (ref 0.0–1.2)
Total Protein: 6.5 g/dL (ref 6.5–8.1)

## 2024-03-26 MED ORDER — ACETAMINOPHEN 325 MG PO TABS
650.0000 mg | ORAL_TABLET | Freq: Once | ORAL | Status: AC
  Administered 2024-03-26: 650 mg via ORAL
  Filled 2024-03-26: qty 2

## 2024-03-26 MED ORDER — DEXAMETHASONE 6 MG PO TABS
20.0000 mg | ORAL_TABLET | Freq: Once | ORAL | Status: AC
  Administered 2024-03-26: 20 mg via ORAL
  Filled 2024-03-26: qty 2

## 2024-03-26 MED ORDER — FAMOTIDINE 20 MG PO TABS
20.0000 mg | ORAL_TABLET | Freq: Once | ORAL | Status: AC
  Administered 2024-03-26: 20 mg via ORAL
  Filled 2024-03-26: qty 1

## 2024-03-26 MED ORDER — DIPHENHYDRAMINE HCL 25 MG PO CAPS
50.0000 mg | ORAL_CAPSULE | Freq: Once | ORAL | Status: AC
  Administered 2024-03-26: 50 mg via ORAL
  Filled 2024-03-26: qty 2

## 2024-03-26 MED ORDER — DARATUMUMAB-HYALURONIDASE-FIHJ 1800-30000 MG-UT/15ML ~~LOC~~ SOLN
1800.0000 mg | Freq: Once | SUBCUTANEOUS | Status: AC
  Administered 2024-03-26: 1800 mg via SUBCUTANEOUS
  Filled 2024-03-26: qty 15

## 2024-03-26 NOTE — Patient Instructions (Signed)
 [facility] - A DEPT OF [facility]  Discharge Instructions: Thank you for choosing [facility] to provide your oncology and hematology care.   If you have a lab appointment with the Cancer Center, please go directly to the Cancer Center and check in at the registration area.   Wear comfortable clothing and clothing appropriate for easy access to any Portacath or PICC line.   We strive to give you quality time with your provider. You may need to reschedule your appointment if you arrive late (15 or more minutes).  Arriving late affects you and other patients whose appointments are after yours.  Also, if you miss three or more appointments without notifying the office, you may be dismissed from the clinic at the provider's discretion.      For prescription refill requests, have your pharmacy contact our office and allow 72 hours for refills to be completed.    Today you received the following chemotherapy and/or immunotherapy agents Darzalex faspro      To help prevent nausea and vomiting after your treatment, we encourage you to take your nausea medication as directed.  BELOW ARE SYMPTOMS THAT SHOULD BE REPORTED IMMEDIATELY: *FEVER GREATER THAN 100.4 F (38 C) OR HIGHER *CHILLS OR SWEATING *NAUSEA AND VOMITING THAT IS NOT CONTROLLED WITH YOUR NAUSEA MEDICATION *UNUSUAL SHORTNESS OF BREATH *UNUSUAL BRUISING OR BLEEDING *URINARY PROBLEMS (pain or burning when urinating, or frequent urination) *BOWEL PROBLEMS (unusual diarrhea, constipation, pain near the anus) TENDERNESS IN MOUTH AND THROAT WITH OR WITHOUT PRESENCE OF ULCERS (sore throat, sores in mouth, or a toothache) UNUSUAL RASH, SWELLING OR PAIN  UNUSUAL VAGINAL DISCHARGE OR ITCHING   Items with * indicate a potential emergency and should be followed up as soon as possible or go to the Emergency Department if any problems should occur.  Please show the CHEMOTHERAPY ALERT CARD or  IMMUNOTHERAPY ALERT CARD at check-in to the Emergency Department and triage nurse.  Should you have questions after your visit or need to cancel or reschedule your appointment, please contact [facility] - A DEPT OF [facility]  Dept: [phone]  and follow the prompts.  Office hours are 8:00 a.m. to 4:30 p.m. Monday - Friday. Please note that voicemails left after 4:00 p.m. may not be returned until the following business day.  We are closed weekends and major holidays. You have access to a nurse at all times for urgent questions. Please call the main number to the clinic Dept: [phone] and follow the prompts.   For any non-urgent questions, you may also contact your provider using MyChart. We now offer e-Visits for anyone 40 and older to request care online for non-urgent symptoms. For details visit [email or web address].   Also download the MyChart app! Go to the app store, search "MyChart", open the app, select [place], and log in with your MyChart username and password.

## 2024-03-27 ENCOUNTER — Other Ambulatory Visit: Payer: Self-pay

## 2024-03-27 LAB — KAPPA/LAMBDA LIGHT CHAINS
Kappa free light chain: 60.3 mg/L — ABNORMAL HIGH (ref 3.3–19.4)
Kappa, lambda light chain ratio: 1.88 — ABNORMAL HIGH (ref 0.26–1.65)
Lambda free light chains: 32.1 mg/L — ABNORMAL HIGH (ref 5.7–26.3)

## 2024-03-28 DIAGNOSIS — H25812 Combined forms of age-related cataract, left eye: Secondary | ICD-10-CM | POA: Diagnosis not present

## 2024-03-29 LAB — MULTIPLE MYELOMA PANEL, SERUM
Albumin SerPl Elph-Mcnc: 3.5 g/dL (ref 2.9–4.4)
Albumin/Glob SerPl: 1.5 (ref 0.7–1.7)
Alpha 1: 0.2 g/dL (ref 0.0–0.4)
Alpha2 Glob SerPl Elph-Mcnc: 0.8 g/dL (ref 0.4–1.0)
B-Globulin SerPl Elph-Mcnc: 0.9 g/dL (ref 0.7–1.3)
Gamma Glob SerPl Elph-Mcnc: 0.6 g/dL (ref 0.4–1.8)
Globulin, Total: 2.4 g/dL (ref 2.2–3.9)
IgA: 124 mg/dL (ref 61–437)
IgG (Immunoglobin G), Serum: 638 mg/dL (ref 603–1613)
IgM (Immunoglobulin M), Srm: 26 mg/dL (ref 15–143)
Total Protein ELP: 5.9 g/dL — ABNORMAL LOW (ref 6.0–8.5)

## 2024-03-31 ENCOUNTER — Other Ambulatory Visit: Payer: Self-pay

## 2024-04-02 ENCOUNTER — Other Ambulatory Visit: Payer: Self-pay | Admitting: Hematology

## 2024-04-02 DIAGNOSIS — Z7189 Other specified counseling: Secondary | ICD-10-CM

## 2024-04-02 DIAGNOSIS — C9002 Multiple myeloma in relapse: Secondary | ICD-10-CM

## 2024-04-09 HISTORY — PX: CATARACT EXTRACTION: SUR2

## 2024-04-14 ENCOUNTER — Other Ambulatory Visit (HOSPITAL_COMMUNITY): Payer: Self-pay

## 2024-04-14 ENCOUNTER — Other Ambulatory Visit: Payer: Self-pay

## 2024-04-14 MED ORDER — PREDNISOLONE ACETATE 1 % OP SUSP
1.0000 [drp] | Freq: Three times a day (TID) | OPHTHALMIC | 0 refills | Status: DC
  Filled 2024-04-14 – 2024-04-22 (×4): qty 10, 67d supply, fill #0

## 2024-04-16 ENCOUNTER — Other Ambulatory Visit: Payer: Self-pay

## 2024-04-16 ENCOUNTER — Other Ambulatory Visit: Payer: Self-pay | Admitting: Internal Medicine

## 2024-04-16 DIAGNOSIS — E1142 Type 2 diabetes mellitus with diabetic polyneuropathy: Secondary | ICD-10-CM

## 2024-04-16 MED ORDER — ACCU-CHEK GUIDE TEST VI STRP
ORAL_STRIP | 2 refills | Status: DC
  Filled 2024-04-16: qty 100, fill #0
  Filled 2024-04-16: qty 100, 33d supply, fill #0
  Filled 2024-04-21: qty 100, 34d supply, fill #0
  Filled 2024-05-19: qty 100, 34d supply, fill #1
  Filled 2024-06-16: qty 100, 34d supply, fill #2

## 2024-04-18 ENCOUNTER — Other Ambulatory Visit: Payer: Self-pay

## 2024-04-21 ENCOUNTER — Other Ambulatory Visit: Payer: Self-pay

## 2024-04-22 ENCOUNTER — Other Ambulatory Visit: Payer: Self-pay

## 2024-04-23 ENCOUNTER — Other Ambulatory Visit

## 2024-04-23 ENCOUNTER — Other Ambulatory Visit: Payer: Self-pay

## 2024-04-23 ENCOUNTER — Ambulatory Visit

## 2024-04-23 ENCOUNTER — Inpatient Hospital Stay (HOSPITAL_BASED_OUTPATIENT_CLINIC_OR_DEPARTMENT_OTHER): Admitting: Hematology

## 2024-04-23 ENCOUNTER — Inpatient Hospital Stay: Attending: Hematology

## 2024-04-23 ENCOUNTER — Inpatient Hospital Stay

## 2024-04-23 VITALS — BP 128/55 | HR 64 | Temp 97.2°F | Resp 16 | Wt 160.8 lb

## 2024-04-23 DIAGNOSIS — Z5111 Encounter for antineoplastic chemotherapy: Secondary | ICD-10-CM

## 2024-04-23 DIAGNOSIS — C9002 Multiple myeloma in relapse: Secondary | ICD-10-CM | POA: Insufficient documentation

## 2024-04-23 DIAGNOSIS — Z7189 Other specified counseling: Secondary | ICD-10-CM

## 2024-04-23 DIAGNOSIS — I129 Hypertensive chronic kidney disease with stage 1 through stage 4 chronic kidney disease, or unspecified chronic kidney disease: Secondary | ICD-10-CM | POA: Diagnosis not present

## 2024-04-23 DIAGNOSIS — Z5112 Encounter for antineoplastic immunotherapy: Secondary | ICD-10-CM | POA: Diagnosis present

## 2024-04-23 DIAGNOSIS — E1122 Type 2 diabetes mellitus with diabetic chronic kidney disease: Secondary | ICD-10-CM | POA: Insufficient documentation

## 2024-04-23 DIAGNOSIS — N183 Chronic kidney disease, stage 3 unspecified: Secondary | ICD-10-CM | POA: Diagnosis not present

## 2024-04-23 LAB — CBC WITH DIFFERENTIAL (CANCER CENTER ONLY)
Abs Immature Granulocytes: 0.01 K/uL (ref 0.00–0.07)
Basophils Absolute: 0.1 K/uL (ref 0.0–0.1)
Basophils Relative: 1 %
Eosinophils Absolute: 0.2 K/uL (ref 0.0–0.5)
Eosinophils Relative: 5 %
HCT: 32.6 % — ABNORMAL LOW (ref 39.0–52.0)
Hemoglobin: 11 g/dL — ABNORMAL LOW (ref 13.0–17.0)
Immature Granulocytes: 0 %
Lymphocytes Relative: 35 %
Lymphs Abs: 1.3 K/uL (ref 0.7–4.0)
MCH: 32.2 pg (ref 26.0–34.0)
MCHC: 33.7 g/dL (ref 30.0–36.0)
MCV: 95.3 fL (ref 80.0–100.0)
Monocytes Absolute: 0.3 K/uL (ref 0.1–1.0)
Monocytes Relative: 9 %
Neutro Abs: 1.8 K/uL (ref 1.7–7.7)
Neutrophils Relative %: 50 %
Platelet Count: 246 K/uL (ref 150–400)
RBC: 3.42 MIL/uL — ABNORMAL LOW (ref 4.22–5.81)
RDW: 16.1 % — ABNORMAL HIGH (ref 11.5–15.5)
WBC Count: 3.5 K/uL — ABNORMAL LOW (ref 4.0–10.5)
nRBC: 0 % (ref 0.0–0.2)

## 2024-04-23 LAB — CMP (CANCER CENTER ONLY)
ALT: 20 U/L (ref 0–44)
AST: 17 U/L (ref 15–41)
Albumin: 4.1 g/dL (ref 3.5–5.0)
Alkaline Phosphatase: 49 U/L (ref 38–126)
Anion gap: 5 (ref 5–15)
BUN: 16 mg/dL (ref 8–23)
CO2: 25 mmol/L (ref 22–32)
Calcium: 9.6 mg/dL (ref 8.9–10.3)
Chloride: 109 mmol/L (ref 98–111)
Creatinine: 1.37 mg/dL — ABNORMAL HIGH (ref 0.61–1.24)
GFR, Estimated: 53 mL/min — ABNORMAL LOW (ref >60)
Glucose, Bld: 186 mg/dL — ABNORMAL HIGH (ref 70–99)
Potassium: 4.3 mmol/L (ref 3.5–5.1)
Sodium: 139 mmol/L (ref 135–145)
Total Bilirubin: 0.5 mg/dL (ref 0.0–1.2)
Total Protein: 6.5 g/dL (ref 6.5–8.1)

## 2024-04-23 MED ORDER — DEXAMETHASONE 6 MG PO TABS
20.0000 mg | ORAL_TABLET | Freq: Once | ORAL | Status: AC
  Administered 2024-04-23: 20 mg via ORAL
  Filled 2024-04-23: qty 2

## 2024-04-23 MED ORDER — ACETAMINOPHEN 325 MG PO TABS
650.0000 mg | ORAL_TABLET | Freq: Once | ORAL | Status: AC
  Administered 2024-04-23: 650 mg via ORAL
  Filled 2024-04-23: qty 2

## 2024-04-23 MED ORDER — DARATUMUMAB-HYALURONIDASE-FIHJ 1800-30000 MG-UT/15ML ~~LOC~~ SOLN
1800.0000 mg | Freq: Once | SUBCUTANEOUS | Status: AC
  Administered 2024-04-23: 1800 mg via SUBCUTANEOUS
  Filled 2024-04-23: qty 15

## 2024-04-23 MED ORDER — DIPHENHYDRAMINE HCL 25 MG PO CAPS
50.0000 mg | ORAL_CAPSULE | Freq: Once | ORAL | Status: AC
  Administered 2024-04-23: 50 mg via ORAL
  Filled 2024-04-23: qty 2

## 2024-04-23 MED ORDER — FAMOTIDINE 20 MG PO TABS
20.0000 mg | ORAL_TABLET | Freq: Once | ORAL | Status: AC
  Administered 2024-04-23: 20 mg via ORAL
  Filled 2024-04-23: qty 1

## 2024-04-23 NOTE — Progress Notes (Signed)
 HEMATOLOGY/ONCOLOGY CLINIC NOTE  Date of Service: 04/23/2024  Patient Care Team: Vicci Barnie NOVAK, MD as PCP - General (Internal Medicine)  CHIEF COMPLAINTS/PURPOSE OF CONSULTATION:  Follow-up for continued evaluation and management of relapsed multiple myeloma  Oncologic History:  78 y.o. male with diagnosis of IgA lambda active multiple myeloma, ISS Stage I. Active disease diagnosed based on presence of anemia, kidney insufficiency, and paraproteinemia with significant predominance of lambda light chains as well as significant elevation of IgA. Bone marrow biopsy confirmed presence of atypical monoclonal plasma cell process in the bone marrow comprising at least 7% of the cellularity. Based on the findings, patient was started on treatment with lenalidomide , bortezomib , and low-dose dexamethasone  based on the anticipated tolerance by the patient. Lenalidomide  was started at 10 mg daily based on creatinine clearance of 42, dexamethasone  dose was reduced to 20 mg weekly based on patient's age. Treatment was complicated by rapid development of cutaneous rash attributed to lenalidomide , inaddition to decreased renal function and now patient has persistent severe anemia. Side-effects were attributed to Lenalidomide  and lenalidomide  was discontinued with subsequent recovery.  Patient has been receiving bortezomib  weekly with low-dose dexamethasone  in 4-day cycles.   Patient has completed induction systemic therapy for his disease with repeat bone marrow biopsy confirming complete response including no evidence of minimal residual disease by cytogenetics or FISH.  HISTORY OF PRESENTING ILLNESS:  Please see previous note for details of initial presentation.  INTERVAL HISTORY:   [name] is a 78 y.o. male is here for continued evaluation and management of his IgA multiple myeloma; Cycle 14 / Day 1 of Daratumumab  SQ q28d. Ambulating with a cane today.   Last seen by me on 02/27/2024  and at the time he did not have any concerns and was doing well.   Today, he says that he has been doing well overall. Endorses lower back pain when walking/moving around, ccasional diarrhea (has bowel movements 3x during those days), worsening pain from the bunion on his great toe (being followed by Podiatry), and has been off balance lately. Denies worsening tingling/numbness in hands/feet, leg swelling, or falls.  Reports that he is tolerating his Daratumumab  Faspro + Pomalidomide .    MEDICAL HISTORY:  Past Medical History:  Diagnosis Date   Anemia    Arthritis    shoulders,feet    Cataract    per eye dr -has appt 5-11   CKD (chronic kidney disease), stage III (HCC)    Elevated PSA    History of ketoacidosis    03-29-2015    History of sepsis    07-25-2013  non-compliant w/ medication   Hyperlipidemia    Hypertension    Nocturia    Peripheral neuropathy    Prostate cancer (HCC)    Type 2 diabetes mellitus with insulin  therapy [facility])     SURGICAL HISTORY: Past Surgical History:  Procedure Laterality Date   CIRCUMCISION  1990's   PROSTATE BIOPSY N/A 12/21/2015   Procedure: BIOPSY TRANSRECTAL ULTRASONIC PROSTATE (TUBP);  Surgeon: Donnice Brooks, MD;  Location: [facility];  Service: Urology;  Laterality: N/A;    SOCIAL HISTORY: Social History   Socioeconomic History   Marital status: Single    Spouse name: Not on file   Number of children: Not on file   Years of education: Not on file   Highest education level: Not on file  Occupational History   Not on file  Tobacco Use   Smoking status: Every Day  Current packs/day: 0.33    Average packs/day: 0.3 packs/day for 50.0 years (16.5 ttl pk-yrs)    Types: Cigarettes   Smokeless tobacco: Never   Tobacco comments:    1 ppwk-4-5 a day   Vaping Use   Vaping status: Never Used  Substance and Sexual Activity   Alcohol use: Yes    Alcohol/week: 1.0 standard drink of alcohol    Types: 1 Cans of beer per  week    Comment: a beer every now and again   Drug use: No   Sexual activity: Not Currently  Other Topics Concern   Not on file  Social History Narrative   Not on file   Social Drivers of Health   Financial Resource Strain: Low Risk  (05/11/2023)   Overall Financial Resource Strain (CARDIA)    Difficulty of Paying Living Expenses: Not hard at all  Food Insecurity: No Food Insecurity (05/11/2023)   Hunger Vital Sign    Worried About Running Out of Food in the Last Year: Never true    Ran Out of Food in the Last Year: Never true  Transportation Needs: No Transportation Needs (05/11/2023)   PRAPARE - Administrator, Civil Service (Medical): No    Lack of Transportation (Non-Medical): No  Physical Activity: Inactive (05/11/2023)   Exercise Vital Sign    Days of Exercise per Week: 0 days    Minutes of Exercise per Session: 0 min  Stress: No Stress Concern Present (05/11/2023)   [employer] of Occupational Health - Occupational Stress Questionnaire    Feeling of Stress : Not at all  Social Connections: Moderately Isolated (05/11/2023)   Social Connection and Isolation Panel    Frequency of Communication with Friends and Family: Three times a week    Frequency of Social Gatherings with Friends and Family: Twice a week    Attends Religious Services: Never    Database administrator or Organizations: Yes    Attends Engineer, structural: More than 4 times per year    Marital Status: Never married  Intimate Partner Violence: Not At Risk (05/11/2023)   Humiliation, Afraid, Rape, and Kick questionnaire    Fear of Current or Ex-Partner: No    Emotionally Abused: No    Physically Abused: No    Sexually Abused: No    FAMILY HISTORY: Family History  Problem Relation Age of Onset   Kidney disease Mother    Cancer Neg Hx    Colon cancer Neg Hx    Colon polyps Neg Hx    Esophageal cancer Neg Hx    Rectal cancer Neg Hx    Stomach cancer Neg Hx     ALLERGIES:  is  allergic to other and lisinopril .  MEDICATIONS:  Current Outpatient Medications  Medication Sig Dispense Refill   Accu-Chek Softclix Lancets lancets Use to test blood sugar 3 times daily. 100 each 3   acetaminophen  (TYLENOL ) 500 MG tablet Take 2 tablets (1,000 mg total) by mouth every 6 (six) hours as needed. 30 tablet 0   acyclovir  (ZOVIRAX ) 400 MG tablet Take 1 tablet (400 mg total) by mouth 2 (two) times daily. 60 tablet 11   amLODipine  (NORVASC ) 10 MG tablet Take 1 tablet (10 mg total) by mouth daily. (Patient taking differently: Take 10 mg by mouth daily. Take 1/2 tablet) 90 tablet 1   aspirin  EC 81 MG tablet Take 81 mg by mouth daily. Swallow whole.     b complex vitamins capsule Take 1  capsule by mouth daily. 30 capsule 5   Blood Glucose Monitoring Suppl (ACCU-CHEK GUIDE) w/Device KIT Use to test blood sugar three times daily 1 kit 0   buPROPion  (WELLBUTRIN ) 75 MG tablet Take 1 tablet (75 mg total) by mouth 2 (two) times daily. To reduce urge of smoking. 60 tablet 3   cetirizine  (ZYRTEC ) 5 MG tablet Take 1 tablet (5 mg total) by mouth at bedtime. 30 tablet 1   Continuous Blood Gluc Receiver (FREESTYLE LIBRE READER) DEVI Use to check blood sugar 3x daily. E11.42 1 each 0   Continuous Blood Gluc Sensor (FREESTYLE LIBRE SENSOR SYSTEM) MISC Use to check blood sugar 3 times daily. Change sensor every 2 weeks. 2 each 12   donepezil  (ARICEPT ) 5 MG tablet TAKE 1 TABLET (5 MG TOTAL) BY MOUTH AT BEDTIME. FOR MEMORY ISSUES 30 tablet 5   fluticasone  (FLONASE ) 50 MCG/ACT nasal spray Place 2 sprays into both nostrils daily. 16 g 2   furosemide  (LASIX ) 20 MG tablet Take 1 tablet (20 mg total) by mouth daily in the morning for 7 days (until 09/08/22). Save remaining tablets. 30 tablet 0   gabapentin  (NEURONTIN ) 300 MG capsule Take 1 capsule (300 mg total) by mouth every morning AND 1 capsule (300 mg total) daily at 12 noon AND 2 capsules (600 mg total) at bedtime. Stop Lyrica . 120 capsule 11   glucose blood  (ACCU-CHEK GUIDE TEST) test strip Use to test blood sugar 3 times daily. 100 each 2   Insulin  Lispro Prot & Lispro (HUMALOG  MIX 75/25 KWIKPEN) (75-25) 100 UNIT/ML Kwikpen Inject 9 units subcutaneously with breakfast and dinner. Increase to 12 units for 3-5 days twice a month after dexamethasone /decadron . 15 mL 11   Insulin  Pen Needle (INSUPEN PEN NEEDLES) 32G X 4 MM MISC USE AS DIRECTED TWICE DAILY WITH INSULIN  100 each 1   ketorolac  (ACULAR ) 0.5 % ophthalmic solution Place 1 drop into the right eye in the morning, at noon, in the evening, and at bedtime. Start the day before surgery. 5 mL 1   Multiple Vitamin (MULTIVITAMIN WITH MINERALS) TABS tablet Take 1 tablet by mouth daily. 60 tablet 2   ofloxacin  (OCUFLOX ) 0.3 % ophthalmic solution Place 1 drop into the right eye 4 (four) times daily. Start one day before surgery. 5 mL 1   ondansetron  (ZOFRAN ) 8 MG tablet Take 1 tablet (8 mg total) by mouth 2 (two) times daily as needed (Nausea or vomiting). 30 tablet 1   pomalidomide  (POMALYST ) 2 MG capsule Take 1 capsule (2 mg total) by mouth daily. Take 1 capsule (2 mg total) daily for 21 days. Take 7 days off. Repeat cycle 21 capsule 0   potassium chloride  SA (KLOR-CON  M) 20 MEQ tablet Take 1 tablet (20 mEq total) by mouth daily. 30 tablet 1   pravastatin  (PRAVACHOL ) 20 MG tablet Take 1 tablet (20 mg total) by mouth daily. 90 tablet 1   prednisoLONE  acetate (PRED FORTE ) 1 % ophthalmic suspension Place 1 drop into the left eye 3 (three) times daily. 10 mL 0   prochlorperazine  (COMPAZINE ) 10 MG tablet Take 1 tablet (10 mg total) by mouth every 6 (six) hours as needed (Nausea or vomiting). 30 tablet 1   tiZANidine  (ZANAFLEX ) 4 MG tablet Take 1 tablet (4 mg total) by mouth every 8 (eight) hours as needed for muscle spasms. 90 tablet 5   valsartan  (DIOVAN ) 40 MG tablet Take 1 tablet (40 mg total) by mouth daily. STOP LISINOPRIL  90 tablet 3   valsartan  (  DIOVAN ) 40 MG tablet Take 1 tablet (40 mg total) by mouth  daily. STOP LISINOPRIL      Vitamin D , Ergocalciferol , (DRISDOL ) 1.25 MG (50000 UNIT) CAPS capsule Take 1 capsule (50,000 Units total) by mouth once a week. 17 capsule 3   No current facility-administered medications for this visit.    REVIEW OF SYSTEMS: .10 Point review of Systems was done is negative except as noted above.  PHYSICAL EXAMINATION: .BP (!) 128/55   Pulse 64   Temp (!) 97.2 F (36.2 C)   Resp 16   Wt 160 lb 12.8 oz (72.9 kg)   SpO2 100%   BMI 25.95 kg/m  . GENERAL:alert, in no acute distress and comfortable SKIN: no acute rashes, no significant lesions EYES: conjunctiva are pink and non-injected, sclera anicteric OROPHARYNX: MMM, no exudates, no oropharyngeal erythema or ulceration NECK: supple, no JVD LYMPH:  no palpable lymphadenopathy in the cervical, axillary or inguinal regions LUNGS: clear to auscultation b/l with normal respiratory effort HEART: regular rate & rhythm ABDOMEN:  normoactive bowel sounds , non tender, not distended. Extremity: no pedal edema PSYCH: alert & oriented x 3 with fluent speech NEURO: no focal motor/sensory deficits   LABORATORY DATA:  I have reviewed the data as listed  .    Latest Ref Rng & Units 04/23/2024    9:45 AM 03/26/2024   10:38 AM 02/27/2024   11:30 AM  CBC EXTENDED  WBC 4.0 - 10.5 K/uL 3.5  3.1  3.4   RBC 4.22 - 5.81 MIL/uL 3.42  3.26  3.07   Hemoglobin 13.0 - 17.0 g/dL 88.9  89.6  9.6   HCT 60.9 - 52.0 % 32.6  31.0  28.8   Platelets 150 - 400 K/uL 246  216  195   NEUT# 1.7 - 7.7 K/uL 1.8  1.3  1.5   Lymph# 0.7 - 4.0 K/uL 1.3  1.1  1.2       Latest Ref Rng & Units 04/23/2024    9:45 AM 03/26/2024   10:38 AM 02/27/2024   11:30 AM  CMP  Glucose 70 - 99 mg/dL 813  801  819   BUN 8 - 23 mg/dL 16  14  16    Creatinine 0.61 - 1.24 mg/dL 8.62  8.50  8.76   Sodium 135 - 145 mmol/L 139  140  140   Potassium 3.5 - 5.1 mmol/L 4.3  3.9  3.5   Chloride 98 - 111 mmol/L 109  107  109   CO2 22 - 32 mmol/L 25  27  27     Calcium  8.9 - 10.3 mg/dL 9.6  9.8  8.6   Total Protein 6.5 - 8.1 g/dL 6.5  6.5  5.7   Total Bilirubin 0.0 - 1.2 mg/dL 0.5  0.5  0.4   Alkaline Phos 38 - 126 U/L 49  65  102   AST 15 - 41 U/L 17  13  24    ALT 0 - 44 U/L 20  15  24     MULTIPLE MYELOMA PANEL AND KAPPA/LAMBDA LIGHT CHAINS 10/2023 - 03/2024    08/29/17 BM Bx:    08/29/17 Cytogenetics:   03/13/17 Cytogenetics:        PATHOLOGY Surgical Pathology  CASE: [id number]  PATIENT: Robert Sparks  Bone Marrow Report      Clinical History: Multiple myeloma in relapse (HCC) ,(BH)      DIAGNOSIS:   BONE MARROW, ASPIRATE, CLOT, CORE:  -Hypercellular bone marrow for age with plasma cell neoplasm  -  See comment   PERIPHERAL BLOOD:  -Mild normocytic-normochromic anemia  -Eosinophilia   COMMENT:   The bone marrow is hypercellular for age with increased number of plasma  cells representing 8% of all cells in the aspirate associated with  numerous small clusters in the clot and biopsy sections. The plasma  cells display lambda light chain restriction consistent with plasma cell  neoplasm.  The background shows trilineage hematopoiesis with generally  nonspecific myeloid changes, likely secondary in nature in this setting.  Correlation with cytogenetic and FISH studies is recommended.      RADIOGRAPHIC STUDIES: I have personally reviewed the radiological images as listed and agreed with the findings in the report. No results found.  ASSESSMENT & PLAN:   78 y.o. male with   1.  Relapsed IgA Lambda Multiple Myeloma ISS Stage I    Active disease was previously diagnosed based on presence of anemia, kidney insufficiency, and paraproteinemia with significant predominance of lambda light chains as well as significant elevation of IgA. Patient is status post patient was treated with induction bortezomib  Revlimid  dexamethasone .  Had issues with skin rashes with Revlimid  and this was discontinued.  Completed  bortezomib  dexamethasone  induction and achieved complete remission. Was on maintenance Velcade  2 weeks which was then switched to maintenance Ninlaro . Patient had issues with worsening neuropathy which was a combination of Velcade  and his diabetes.  Ninlaro  was discontinued as well after maintenance of more than 2 years.  2.  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 Bone marrow biopsy shows 8% lambda restricted plasma cells PET CT scan with no hypermetabolic osseous lesions  3.  Hypertension 4.  Diabetes type 2 5 .chronic kidney disease due to hypertension and diabetes. 6.  Peripheral neuropathy related to diabetes and previous myeloma treatments.  PLAN: - Discussed lab results on 04/23/2024 in detail with patient: CBC showed WBC of 3.5K increased from 3.1K, Hemoglobin of 11.0 increased from 10.3, and PLTs of 246K increased from 216K CMP with Creatinine 1.37 decreased from 1.49.   Stable CKD M protein undetectable. Kappa/Lambda Lights Chains stable overall. No clinical signs or symptoms suggestive of myeloma progression at this time Tolerating Dara Faspro + Pomalidomide  - Continue aspirin  for VTE prophylaxis - Continue acyclovir  for VZV and shingles prophylaxis - Maintain good p.o. fluid intake of at least 2 to 3 L of water  daily - Balance issues possibly related to his neuropathy and bunions as he subconsciously adjusts for these.   Continue to use cane for improved balance and to prevent falls.  - Vaccine counseling provided: Flu, PNA, Covid-19 (if not had within the past 6 months)  FOLLOW-UP: Per integrated scheduling MD visit in 2 months   The total time spent in the appointment was 30 minutes*.  All of the patient's questions were answered with apparent satisfaction. The patient knows to call the clinic with any problems, questions or concerns.   Robert Saran MD MS AAHIVMS [facility]  [facility] Hematology/Oncology Physician [facility]  .*Total Encounter Time as defined by the Centers for Medicare and Medicaid Services includes, in addition to the face-to-face time of a patient visit (documented in the note above) non-face-to-face time: obtaining and reviewing outside history, ordering and reviewing medications, tests or procedures, care coordination (communications with other health care professionals or caregivers) and documentation in the medical record.  Robert Sparks as a scribe for Robert Saran, MD.,have documented all relevant  documentation on the behalf of Robert Saran, MD,as directed by  Robert Saran, MD while in the presence of Robert Saran, MD.  I have reviewed the above documentation for accuracy and completeness, and I agree with the above. Robert Candida Saran MD.

## 2024-04-23 NOTE — Patient Instructions (Signed)

## 2024-04-24 LAB — KAPPA/LAMBDA LIGHT CHAINS
Kappa free light chain: 45.7 mg/L — ABNORMAL HIGH (ref 3.3–19.4)
Kappa, lambda light chain ratio: 1.84 — ABNORMAL HIGH (ref 0.26–1.65)
Lambda free light chains: 24.9 mg/L (ref 5.7–26.3)

## 2024-04-25 ENCOUNTER — Other Ambulatory Visit: Payer: Self-pay

## 2024-04-27 ENCOUNTER — Other Ambulatory Visit: Payer: Self-pay

## 2024-04-28 ENCOUNTER — Other Ambulatory Visit: Payer: Self-pay

## 2024-04-28 LAB — MULTIPLE MYELOMA PANEL, SERUM
Albumin SerPl Elph-Mcnc: 3.6 g/dL (ref 2.9–4.4)
Albumin/Glob SerPl: 1.6 (ref 0.7–1.7)
Alpha 1: 0.2 g/dL (ref 0.0–0.4)
Alpha2 Glob SerPl Elph-Mcnc: 0.8 g/dL (ref 0.4–1.0)
B-Globulin SerPl Elph-Mcnc: 0.9 g/dL (ref 0.7–1.3)
Gamma Glob SerPl Elph-Mcnc: 0.4 g/dL (ref 0.4–1.8)
Globulin, Total: 2.4 g/dL (ref 2.2–3.9)
IgA: 109 mg/dL (ref 61–437)
IgG (Immunoglobin G), Serum: 603 mg/dL (ref 603–1613)
IgM (Immunoglobulin M), Srm: 27 mg/dL (ref 15–143)
Total Protein ELP: 6 g/dL (ref 6.0–8.5)

## 2024-04-29 ENCOUNTER — Encounter: Payer: Self-pay | Admitting: Hematology

## 2024-04-30 ENCOUNTER — Other Ambulatory Visit: Payer: Self-pay | Admitting: Pharmacist

## 2024-04-30 ENCOUNTER — Other Ambulatory Visit: Payer: Self-pay

## 2024-04-30 NOTE — Progress Notes (Signed)
 Pharmacy Quality Measure Review  This patient is appearing on a report for being at risk of failing the adherence measure for hypertension (ACEi/ARB) medications this calendar year.   Medication: valsartan  Last fill date: 01/30/2024 for 90 day supply  Contacted pharmacy to facilitate refills. Patient called and informed ready for pick-up.  Herlene Fleeta Morris, PharmD, JAQUELINE, CPP Clinical Pharmacist [facility] & [facility] [phone]

## 2024-05-02 ENCOUNTER — Other Ambulatory Visit: Payer: Self-pay

## 2024-05-06 ENCOUNTER — Other Ambulatory Visit: Payer: Self-pay

## 2024-05-06 DIAGNOSIS — C9002 Multiple myeloma in relapse: Secondary | ICD-10-CM

## 2024-05-06 MED ORDER — POMALIDOMIDE 2 MG PO CAPS: 2.0000 mg | ORAL_CAPSULE | Freq: Every day | ORAL | 0 refills | Status: DC

## 2024-05-07 ENCOUNTER — Ambulatory Visit (INDEPENDENT_AMBULATORY_CARE_PROVIDER_SITE_OTHER): Admitting: Podiatry

## 2024-05-07 ENCOUNTER — Encounter: Payer: Self-pay | Admitting: Podiatry

## 2024-05-07 DIAGNOSIS — E1159 Type 2 diabetes mellitus with other circulatory complications: Secondary | ICD-10-CM | POA: Diagnosis not present

## 2024-05-07 DIAGNOSIS — E1142 Type 2 diabetes mellitus with diabetic polyneuropathy: Secondary | ICD-10-CM | POA: Diagnosis not present

## 2024-05-07 DIAGNOSIS — L84 Corns and callosities: Secondary | ICD-10-CM | POA: Diagnosis not present

## 2024-05-07 DIAGNOSIS — M79675 Pain in left toe(s): Secondary | ICD-10-CM

## 2024-05-07 DIAGNOSIS — M79674 Pain in right toe(s): Secondary | ICD-10-CM | POA: Diagnosis not present

## 2024-05-07 DIAGNOSIS — B351 Tinea unguium: Secondary | ICD-10-CM | POA: Diagnosis not present

## 2024-05-07 NOTE — Progress Notes (Signed)
 This patient presents to the office with chief complaint of long thick painful nails.  Patient says the nails are painful walking and wearing shoes.  This patient is unable to self treat.  This patient is unable to trim his nails since he is unable to reach his nails.  He also has painful callus when walking.he presents to the office for preventative foot care services.  General Appearance  Alert, conversant and in no acute stress.  Vascular  Dorsalis pedis and posterior tibial  pulses are  weakly palpable  bilaterally.  Capillary return is within normal limits  bilaterally. Temperature is within normal limits  bilaterally.  Neurologic  Senn-Weinstein monofilament wire test within normal limits  bilaterally. Muscle power within normal limits bilaterally.  Nails Thick disfigured discolored nails with subungual debris  from hallux to fifth toes bilaterally. No evidence of bacterial infection or drainage bilaterally.  Orthopedic  No limitations of motion  feet .  No crepitus or effusions noted.  No bony pathology or digital deformities noted.  Skin  normotropic skin with no porokeratosis noted bilaterally.  No signs of infections or ulcers noted.   Callus plantar right hallux.  Onychomycosis  Nails  B/L.  Pain in right toes  Pain in left toes  Callus right hallux.  Debridement of nails both feet followed trimming the nails with dremel tool.  Debride callus with # 15 blade and dremel tool.  Debride callus with # 15 blade. Padding dispensed. RTC 9   weeks    Cordella Bold DPM

## 2024-05-08 ENCOUNTER — Other Ambulatory Visit: Payer: Self-pay

## 2024-05-12 ENCOUNTER — Other Ambulatory Visit: Payer: Self-pay

## 2024-05-13 ENCOUNTER — Other Ambulatory Visit: Payer: Self-pay | Admitting: Pharmacist

## 2024-05-13 ENCOUNTER — Other Ambulatory Visit: Payer: Self-pay

## 2024-05-13 DIAGNOSIS — E1169 Type 2 diabetes mellitus with other specified complication: Secondary | ICD-10-CM

## 2024-05-13 MED ORDER — PRAVASTATIN SODIUM 20 MG PO TABS
20.0000 mg | ORAL_TABLET | Freq: Every day | ORAL | 1 refills | Status: AC
  Filled 2024-05-13 – 2024-06-02 (×2): qty 90, 90d supply, fill #0

## 2024-05-13 NOTE — Progress Notes (Addendum)
 Pharmacy Quality Measure Review  This patient is appearing on a report for being at risk of failing the adherence measure for hypertension (ACEi/ARB) medications this calendar year.   Medication: valsartan  Last fill date: 05/06/2024 for 90 day supply  Insurance report was not up to date. No further follow-up needed at this time.   Medication: pravastatin  Last fill date: 03/06/2024 for 90 day supply. Fillable 06/04/2024.  Insurance report was not up to date. Of note, new rxn needed for next fill. Sent to patient's pharmacy.  Herlene Fleeta Morris, PharmD, JAQUELINE, CPP Clinical Pharmacist [facility] [phone]

## 2024-05-13 NOTE — Addendum Note (Signed)
 Addended by: FLEETA MORRIS, GARNETTE L on: 05/13/2024 10:02 AM   Modules accepted: Orders

## 2024-05-19 ENCOUNTER — Other Ambulatory Visit: Payer: Self-pay

## 2024-05-20 ENCOUNTER — Other Ambulatory Visit: Payer: Self-pay

## 2024-05-21 ENCOUNTER — Inpatient Hospital Stay

## 2024-05-23 ENCOUNTER — Other Ambulatory Visit: Payer: Self-pay

## 2024-05-27 ENCOUNTER — Other Ambulatory Visit: Payer: Self-pay

## 2024-06-02 ENCOUNTER — Other Ambulatory Visit: Payer: Self-pay

## 2024-06-02 ENCOUNTER — Other Ambulatory Visit: Payer: Self-pay | Admitting: Pharmacist

## 2024-06-02 NOTE — Progress Notes (Signed)
 Pharmacy Quality Measure Review  This patient is appearing on a report for being at risk of failing the adherence measure for hypertension (ACEi/ARB) medications this calendar year.   Medication: valsartan  Last fill date: 05/06/2024 for 90 day supply  Insurance report was not up to date. No further follow-up needed at this time.   Medication: pravastatin  Last fill date: 03/06/2024 for 90 day supply. Collaborated with pharmacy to fill.   Robert Sparks, PharmD, JAQUELINE, CPP Clinical Pharmacist [facility] [phone]

## 2024-06-04 ENCOUNTER — Other Ambulatory Visit: Payer: Self-pay | Admitting: Internal Medicine

## 2024-06-04 ENCOUNTER — Other Ambulatory Visit: Payer: Self-pay

## 2024-06-04 ENCOUNTER — Other Ambulatory Visit: Payer: Self-pay | Admitting: Pharmacist

## 2024-06-04 DIAGNOSIS — E1142 Type 2 diabetes mellitus with diabetic polyneuropathy: Secondary | ICD-10-CM

## 2024-06-04 NOTE — Progress Notes (Signed)
 Pharmacy Quality Measure Review  This patient is appearing on a report for being at risk of failing the adherence measure for hypertension (ACEi/ARB) medications this calendar year.   Medication: valsartan  Last fill date: 05/06/2024 for 90 day supply  Insurance report was not up to date. No further follow-up needed at this time.   Medication: pravastatin  Last fill date: 03/06/2024 for 90 day supply. Collaborated with pharmacy to fill. Contacted patient and he will pick this up today.  Herlene Fleeta Morris, PharmD, JAQUELINE, CPP Clinical Pharmacist [facility] & [facility] [phone]

## 2024-06-09 ENCOUNTER — Other Ambulatory Visit: Payer: Self-pay | Admitting: Hematology

## 2024-06-09 DIAGNOSIS — C9002 Multiple myeloma in relapse: Secondary | ICD-10-CM

## 2024-06-10 ENCOUNTER — Other Ambulatory Visit: Payer: Self-pay

## 2024-06-10 ENCOUNTER — Encounter: Payer: Self-pay | Admitting: Internal Medicine

## 2024-06-10 ENCOUNTER — Other Ambulatory Visit: Payer: Self-pay | Admitting: Internal Medicine

## 2024-06-10 ENCOUNTER — Ambulatory Visit: Attending: Internal Medicine | Admitting: Internal Medicine

## 2024-06-10 VITALS — BP 129/70 | HR 59 | Temp 97.7°F | Ht 66.0 in | Wt 163.0 lb

## 2024-06-10 DIAGNOSIS — Z23 Encounter for immunization: Secondary | ICD-10-CM | POA: Diagnosis not present

## 2024-06-10 DIAGNOSIS — E1159 Type 2 diabetes mellitus with other circulatory complications: Secondary | ICD-10-CM

## 2024-06-10 DIAGNOSIS — E1142 Type 2 diabetes mellitus with diabetic polyneuropathy: Secondary | ICD-10-CM

## 2024-06-10 DIAGNOSIS — C9002 Multiple myeloma in relapse: Secondary | ICD-10-CM

## 2024-06-10 DIAGNOSIS — Z794 Long term (current) use of insulin: Secondary | ICD-10-CM | POA: Diagnosis not present

## 2024-06-10 DIAGNOSIS — E1169 Type 2 diabetes mellitus with other specified complication: Secondary | ICD-10-CM | POA: Diagnosis not present

## 2024-06-10 DIAGNOSIS — F17200 Nicotine dependence, unspecified, uncomplicated: Secondary | ICD-10-CM

## 2024-06-10 DIAGNOSIS — E785 Hyperlipidemia, unspecified: Secondary | ICD-10-CM

## 2024-06-10 DIAGNOSIS — N1831 Chronic kidney disease, stage 3a: Secondary | ICD-10-CM

## 2024-06-10 DIAGNOSIS — I152 Hypertension secondary to endocrine disorders: Secondary | ICD-10-CM

## 2024-06-10 LAB — POCT GLYCOSYLATED HEMOGLOBIN (HGB A1C): HbA1c, POC (controlled diabetic range): 6.8 % (ref 0.0–7.0)

## 2024-06-10 LAB — GLUCOSE, POCT (MANUAL RESULT ENTRY): POC Glucose: 171 mg/dL — AB (ref 70–99)

## 2024-06-10 MED ORDER — FREESTYLE LIBRE 3 PLUS SENSOR MISC
11 refills | Status: AC
  Filled 2024-06-10: qty 2, 30d supply, fill #0

## 2024-06-10 MED ORDER — FREESTYLE LIBRE 3 READER DEVI
0 refills | Status: AC
  Filled 2024-06-10: qty 1, 30d supply, fill #0

## 2024-06-10 MED ORDER — INSULIN LISPRO PROT & LISPRO (75-25 MIX) 100 UNIT/ML KWIKPEN
PEN_INJECTOR | SUBCUTANEOUS | 11 refills | Status: AC
  Filled 2024-06-10: qty 15, 75d supply, fill #0

## 2024-06-10 MED ORDER — COVID-19 MRNA VAC-TRIS(PFIZER) 30 MCG/0.3ML IM SUSY
0.3000 mL | PREFILLED_SYRINGE | Freq: Once | INTRAMUSCULAR | 0 refills | Status: AC
  Filled 2024-06-10: qty 0.3, 1d supply, fill #0

## 2024-06-10 NOTE — Progress Notes (Signed)
 Patient ID: Robert Sparks, male    DOB: 1946/01/09  MRN: [id number]  CC: Diabetes (DM f/u. Med refills. /Discuss gabapentin  long term side effects/Flu vax administered on 06/10/24 - C.A.)   Subjective: Robert Sparks is a 78 y.o. male who presents for chronic ds management. His concerns today include:  Patient with history of HTN, DM 2 with polyneuropathy, HL, CKD stage 3, tob dependence, EtOH abuse, prostate CA, OA knee, vit B 12 def, spinal stenosis, and multiple myeloma, memory changes MMSE 25/30 2023), CTS RT hand/EMG 12/2018.   Discussed the use of AI scribe software for clinical note transcription with the patient, who gave verbal consent to proceed.  History of Present Illness   Robert Sparks is a 78 year old male with diabetes, hypertension, chronic kidney disease, and multiple myeloma who presents for follow-up of his chronic medical conditions.  DM: Results for orders placed or performed in visit on 06/10/24  POCT glycosylated hemoglobin (Hb A1C)   Collection Time: 06/10/24 10:22 AM  Result Value Ref Range   Hemoglobin A1C     HbA1c POC (<> result, manual entry)     HbA1c, POC (prediabetic range)     HbA1c, POC (controlled diabetic range) 6.8 0.0 - 7.0 %  POCT glucose (manual entry)   Collection Time: 06/10/24 10:23 AM  Result Value Ref Range   POC Glucose 171 (A) 70 - 99 mg/dl   *Note: Due to a large number of results and/or encounters for the requested time period, some results have not been displayed. A complete set of results can be found in Results Review.  He manages his diabetes with Humalog  75/25 insulin , taking 9 units twice daily, increasing to 12 units twice daily during myeloma treatment with Decadron . His A1c was 7.1% four months ago and is 6.8% today. He checks his blood sugar three to four times daily, with readings ranging from 120 to 200 mg/dL, occasionally experiencing lows below 80 mg/dL. Would like CGM; was denied or co=pay too high 2 yrs ago. He  eats frequently but tries to avoid sugary snacks. His weight is stable at 163 pounds, similar to four months ago. He walks occasionally, about a mile to the store, but not frequently due to cold weather.    HTN: he is prescribed amlodipine  10 mg and valsartan  40 mg. He has stopped taking amlodipine  due to dizziness but continues valsartan  daily. His blood pressure readings at home range from 132 to 140 mmHg systolic. He last saw his oncologist in October, with a blood pressure of 128/55 mmHg. Did not take Valsartan  as yet for the morning.  He continues to take pravastatin  daily for cholesterol management.  He has chronic kidney disease, stage 3A. He smokes cigarettes and is not fully committed to quitting.   He is still receiving treatment for  relapse of multiple myeloma.  He recently had cataract surgery on both eyes, performed by Dr. Eyvonne, about a month ago.   HM: He received a flu shot today.        Patient Active Problem List   Diagnosis Date Noted   Drug-induced polyneuropathy 09/07/2022   Pain in right shoulder 01/06/2022   Port-A-Cath in place 11/02/2021   Macroalbuminuric diabetic nephropathy (HCC) 08/31/2021   Multiple myeloma in relapse (HCC) 06/24/2021   Counseling regarding advance care planning and goals of care 06/24/2021   Memory changes 04/25/2019   Hyperlipidemia associated with type 2 diabetes mellitus (HCC) 04/25/2019   Chronic bilateral low back  pain without sciatica 03/14/2019   Spinal stenosis, lumbar region with neurogenic claudication 03/14/2019   History of prostate cancer 12/20/2018   Neuropathy of right hand 12/20/2018   Primary osteoarthritis of right knee 01/07/2018   Diabetic polyneuropathy associated with type 2 diabetes mellitus (HCC) 01/07/2018   Vitamin B12 deficiency 09/04/2017   Cataract of both eyes 04/12/2017   Tobacco dependence 04/12/2017   Multiple myeloma in remission (HCC) 02/23/2017   Hyperlipidemia 06/21/2016   CKD (chronic kidney  disease) stage 3, GFR 30-59 ml/min (HCC) 02/23/2016   Malignant neoplasm of prostate (HCC) 02/21/2016   Controlled type 2 diabetes mellitus with stage 3 chronic kidney disease, with long-term current use of insulin  (HCC) 03/29/2015   HTN (hypertension) 07/28/2013   Anemia, chronic disease 07/26/2013   ETOH abuse 07/25/2013     Current Outpatient Medications on File Prior to Visit  Medication Sig Dispense Refill   Accu-Chek Softclix Lancets lancets Use to test blood sugar 3 times daily. 100 each 3   acetaminophen  (TYLENOL ) 500 MG tablet Take 2 tablets (1,000 mg total) by mouth every 6 (six) hours as needed. 30 tablet 0   acyclovir  (ZOVIRAX ) 400 MG tablet Take 1 tablet (400 mg total) by mouth 2 (two) times daily. 60 tablet 11   aspirin  EC 81 MG tablet Take 81 mg by mouth daily. Swallow whole.     b complex vitamins capsule Take 1 capsule by mouth daily. 30 capsule 5   Blood Glucose Monitoring Suppl (ACCU-CHEK GUIDE) w/Device KIT Use to test blood sugar three times daily 1 kit 0   buPROPion  (WELLBUTRIN ) 75 MG tablet Take 1 tablet (75 mg total) by mouth 2 (two) times daily. To reduce urge of smoking. 60 tablet 3   cetirizine  (ZYRTEC ) 5 MG tablet Take 1 tablet (5 mg total) by mouth at bedtime. 30 tablet 1   fluticasone  (FLONASE ) 50 MCG/ACT nasal spray Place 2 sprays into both nostrils daily. 16 g 2   gabapentin  (NEURONTIN ) 300 MG capsule Take 1 capsule (300 mg total) by mouth every morning AND 1 capsule (300 mg total) daily at 12 noon AND 2 capsules (600 mg total) at bedtime. Stop Lyrica . 120 capsule 11   glucose blood (ACCU-CHEK GUIDE TEST) test strip Use to test blood sugar 3 times daily. 100 each 2   Insulin  Lispro Prot & Lispro (HUMALOG  MIX 75/25 KWIKPEN) (75-25) 100 UNIT/ML Kwikpen Inject 9 units subcutaneously with breakfast and dinner. Increase to 12 units for 3-5 days twice a month after dexamethasone /decadron . 15 mL 11   Insulin  Pen Needle (INSUPEN PEN NEEDLES) 32G X 4 MM MISC USE AS  DIRECTED TWICE DAILY WITH INSULIN  100 each 1   ketorolac  (ACULAR ) 0.5 % ophthalmic solution Place 1 drop into the right eye in the morning, at noon, in the evening, and at bedtime. Start the day before surgery. 5 mL 1   Multiple Vitamin (MULTIVITAMIN WITH MINERALS) TABS tablet Take 1 tablet by mouth daily. 60 tablet 2   ofloxacin  (OCUFLOX ) 0.3 % ophthalmic solution Place 1 drop into the right eye 4 (four) times daily. Start one day before surgery. 5 mL 1   ondansetron  (ZOFRAN ) 8 MG tablet Take 1 tablet (8 mg total) by mouth 2 (two) times daily as needed (Nausea or vomiting). 30 tablet 1   pomalidomide  (POMALYST ) 2 MG capsule Take 1 capsule by mouth once daily for 21 days, then 7 days off. 21 capsule 0   potassium chloride  SA (KLOR-CON  M) 20 MEQ tablet Take 1 tablet (  20 mEq total) by mouth daily. 30 tablet 1   pravastatin  (PRAVACHOL ) 20 MG tablet Take 1 tablet (20 mg total) by mouth daily. 90 tablet 1   prednisoLONE  acetate (PRED FORTE ) 1 % ophthalmic suspension Place 1 drop into the left eye 3 (three) times daily. 10 mL 0   prochlorperazine  (COMPAZINE ) 10 MG tablet Take 1 tablet (10 mg total) by mouth every 6 (six) hours as needed (Nausea or vomiting). 30 tablet 1   valsartan  (DIOVAN ) 40 MG tablet Take 1 tablet (40 mg total) by mouth daily. STOP LISINOPRIL  90 tablet 3   Vitamin D , Ergocalciferol , (DRISDOL ) 1.25 MG (50000 UNIT) CAPS capsule Take 1 capsule (50,000 Units total) by mouth once a week. 17 capsule 3   donepezil  (ARICEPT ) 5 MG tablet TAKE 1 TABLET (5 MG TOTAL) BY MOUTH AT BEDTIME. FOR MEMORY ISSUES 30 tablet 5   valsartan  (DIOVAN ) 40 MG tablet Take 1 tablet (40 mg total) by mouth daily. STOP LISINOPRIL  (Patient not taking: Reported on 06/10/2024)     No current facility-administered medications on file prior to visit.    Allergies  Allergen Reactions   Other Other (See Comments)    Nicoderm CQ  = Bad Dreams Nicotine  gum = Bad Dreams    Lisinopril  Cough    Social History    Socioeconomic History   Marital status: Single    Spouse name: Not on file   Number of children: Not on file   Years of education: Not on file   Highest education level: Not on file  Occupational History   Not on file  Tobacco Use   Smoking status: Every Day    Current packs/day: 0.33    Average packs/day: 0.3 packs/day for 50.0 years (16.5 ttl pk-yrs)    Types: Cigarettes   Smokeless tobacco: Never   Tobacco comments:    1 ppwk-4-5 a day   Vaping Use   Vaping status: Never Used  Substance and Sexual Activity   Alcohol use: Yes    Alcohol/week: 1.0 standard drink of alcohol    Types: 1 Cans of beer per week    Comment: a beer every now and again   Drug use: No   Sexual activity: Not Currently  Other Topics Concern   Not on file  Social History Narrative   Not on file   Social Drivers of Health   Financial Resource Strain: Low Risk  (06/10/2024)   Overall Financial Resource Strain (CARDIA)    Difficulty of Paying Living Expenses: Not very hard  Food Insecurity: No Food Insecurity (06/10/2024)   Hunger Vital Sign    Worried About Running Out of Food in the Last Year: Never true    Ran Out of Food in the Last Year: Never true  Transportation Needs: No Transportation Needs (06/10/2024)   PRAPARE - Administrator, Civil Service (Medical): No    Lack of Transportation (Non-Medical): No  Physical Activity: Inactive (06/10/2024)   Exercise Vital Sign    Days of Exercise per Week: 0 days    Minutes of Exercise per Session: 0 min  Stress: No Stress Concern Present (06/10/2024)   [employer] of Occupational Health - Occupational Stress Questionnaire    Feeling of Stress: Not at all  Social Connections: Moderately Isolated (05/11/2023)   Social Connection and Isolation Panel    Frequency of Communication with Friends and Family: Three times a week    Frequency of Social Gatherings with Friends and Family: Twice a week  Attends Religious Services: Never     Active Member of Clubs or Organizations: Yes    Attends Banker Meetings: More than 4 times per year    Marital Status: Never married  Intimate Partner Violence: Not At Risk (06/10/2024)   Humiliation, Afraid, Rape, and Kick questionnaire    Fear of Current or Ex-Partner: No    Emotionally Abused: No    Physically Abused: No    Sexually Abused: No    Family History  Problem Relation Age of Onset   Kidney disease Mother    Cancer Neg Hx    Colon cancer Neg Hx    Colon polyps Neg Hx    Esophageal cancer Neg Hx    Rectal cancer Neg Hx    Stomach cancer Neg Hx     Past Surgical History:  Procedure Laterality Date   CIRCUMCISION  1990's   PROSTATE BIOPSY N/A 12/21/2015   Procedure: BIOPSY TRANSRECTAL ULTRASONIC PROSTATE (TUBP);  Surgeon: Donnice Brooks, MD;  Location: [facility];  Service: Urology;  Laterality: N/A;    ROS: Review of Systems Negative except as stated above  PHYSICAL EXAM: BP 129/70   Pulse (!) 59   Temp 97.7 F (36.5 C) (Oral)   Ht 5' 6 (1.676 m)   Wt 163 lb (73.9 kg)   SpO2 100%   BMI 26.31 kg/m   Wt Readings from Last 3 Encounters:  06/10/24 163 lb (73.9 kg)  04/23/24 160 lb 12.8 oz (72.9 kg)  03/26/24 162 lb 4 oz (73.6 kg)    Physical Exam   General appearance - alert, well appearing, elderly AAM  and in no distress Mental status - normal mood, behavior, speech, dress, motor activity, and thought processes Mouth - mucous membranes moist, pharynx normal without lesions Neck - supple, no significant adenopathy Chest - clear to auscultation, no wheezes, rales or rhonchi, symmetric air entry Heart - normal rate, regular rhythm, normal S1, S2, no murmurs, rubs, clicks or gallops Extremities - trace BL LE edema     Latest Ref Rng & Units 04/23/2024    9:45 AM 03/26/2024   10:38 AM 02/27/2024   11:30 AM  CMP  Glucose 70 - 99 mg/dL 813  801  819   BUN 8 - 23 mg/dL 16  14  16    Creatinine 0.61 - 1.24 mg/dL 8.62   8.50  8.76   Sodium 135 - 145 mmol/L 139  140  140   Potassium 3.5 - 5.1 mmol/L 4.3  3.9  3.5   Chloride 98 - 111 mmol/L 109  107  109   CO2 22 - 32 mmol/L 25  27  27    Calcium  8.9 - 10.3 mg/dL 9.6  9.8  8.6   Total Protein 6.5 - 8.1 g/dL 6.5  6.5  5.7   Total Bilirubin 0.0 - 1.2 mg/dL 0.5  0.5  0.4   Alkaline Phos 38 - 126 U/L 49  65  102   AST 15 - 41 U/L 17  13  24    ALT 0 - 44 U/L 20  15  24     Lipid Panel     Component Value Date/Time   CHOL 199 09/07/2022 1008   TRIG 116 09/07/2022 1008   HDL 96 09/07/2022 1008   CHOLHDL 2.1 09/07/2022 1008   CHOLHDL 3.6 06/21/2016 1047   VLDL 50 (H) 06/21/2016 1047   LDLCALC 83 09/07/2022 1008    CBC    Component Value Date/Time  WBC 3.5 (L) 04/23/2024 0945   WBC 5.6 07/13/2021 0741   RBC 3.42 (L) 04/23/2024 0945   HGB 11.0 (L) 04/23/2024 0945   HGB 12.2 (L) 12/06/2017 0920   HGB 10.1 (L) 07/13/2017 0938   HCT 32.6 (L) 04/23/2024 0945   HCT 37.1 (L) 12/06/2017 0920   HCT 30.6 (L) 07/13/2017 0938   PLT 246 04/23/2024 0945   PLT 250 12/06/2017 0920   MCV 95.3 04/23/2024 0945   MCV 98 (H) 12/06/2017 0920   MCV 96.2 07/13/2017 0938   MCH 32.2 04/23/2024 0945   MCHC 33.7 04/23/2024 0945   RDW 16.1 (H) 04/23/2024 0945   RDW 14.1 12/06/2017 0920   RDW 14.4 07/13/2017 0938   LYMPHSABS 1.3 04/23/2024 0945   LYMPHSABS 0.8 (L) 07/13/2017 0938   MONOABS 0.3 04/23/2024 0945   MONOABS 0.4 07/13/2017 0938   EOSABS 0.2 04/23/2024 0945   EOSABS 0.5 07/13/2017 0938   EOSABS 0.8 (H) 11/21/2016 1416   BASOSABS 0.1 04/23/2024 0945   BASOSABS 0.0 07/13/2017 0938    ASSESSMENT AND PLAN: 1. Type 2 diabetes mellitus with peripheral neuropathy (HCC) (Primary) At goal. Encouraged him to continue trying to eat healthy and to move is much as he can.  Continue his Humalog  75/25 insulin  9 units twice a day and increase to 12 units twice a day when having to take Decadron . - POCT glucose (manual entry) - POCT glycosylated hemoglobin (Hb A1C) -  Continuous Glucose Sensor (FREESTYLE LIBRE 3 PLUS SENSOR) MISC; Change sensor every 15 days.  Dispense: 2 each; Refill: 11 - Continuous Glucose Receiver (FREESTYLE LIBRE 3 READER) DEVI; UAD to check BS  Dispense: 1 each; Refill: 0  2. Insulin  long-term use (HCC) See #1 above  3. Hyperlipidemia associated with type 2 diabetes mellitus (HCC) Continue pravastatin   4. Hypertension associated with type 2 diabetes mellitus (HCC) At goal. Norvasc  removed from his medication list as he has not been taking it stating that it was causing dizziness. Continue valsartan  40 mg daily  5. Stage 3a chronic kidney disease (HCC) Stable.  Will continue to monitor.  Not on NSAIDs.  6. Tobacco dependence Continue to encourage him to quit smoking.  He is not ready to fully commit to quitting.  7. Multiple myeloma in relapse (HCC) Followed by oncology.  Receiving treatment for relapse.  8. Need for immunization against influenza - Flu vaccine HIGH DOSE PF(Fluzone  Trivalent)   Patient was given the opportunity to ask questions.  Patient verbalized understanding of the plan and was able to repeat key elements of the plan.   This documentation was completed using Paediatric nurse.  Any transcriptional errors are unintentional.  Orders Placed This Encounter  Procedures   Flu vaccine HIGH DOSE PF(Fluzone  Trivalent)   POCT glucose (manual entry)   POCT glycosylated hemoglobin (Hb A1C)     Requested Prescriptions   Signed Prescriptions Disp Refills   Continuous Glucose Sensor (FREESTYLE LIBRE 3 PLUS SENSOR) MISC 2 each 11    Sig: Change sensor every 15 days.   Continuous Glucose Receiver (FREESTYLE LIBRE 3 READER) DEVI 1 each 0    Sig: Use as directed to check blood sugars.    Return in about 4 months (around 10/09/2024) for Medicare Wellness Visit in 2-3  wks with CMA/LPN.  Barnie Louder, MD, FACP

## 2024-06-10 NOTE — Patient Instructions (Signed)
  VISIT SUMMARY: You came in today for a follow-up on your chronic medical conditions, including diabetes, hypertension, chronic kidney disease, and multiple myeloma. We reviewed your current medications, recent lab results, and overall health status. You also received a flu shot during this visit.  YOUR PLAN: -TYPE 2 DIABETES MELLITUS: Type 2 diabetes is a condition where your body does not use insulin  properly, leading to high blood sugar levels. Your diabetes is well-controlled with an A1c of 6.8%. Continue taking Humalog  75/25 insulin , 12 units twice a day when taking Decadron . We have submitted a request for continuous glucose monitor coverage.  -HYPERTENSION: Hypertension is high blood pressure. Continue taking valsartan  40 mg daily. We have discontinued amlodipine  due to dizziness.  -HYPERLIPIDEMIA: Hyperlipidemia is high cholesterol. Continue taking pravastatin  daily to manage your cholesterol levels.  -STAGE 3A CHRONIC KIDNEY DISEASE: Chronic kidney disease is a condition where your kidneys are damaged and can't filter blood as well as they should. Your kidney function has been stable, and we will continue to monitor it.  -MULTIPLE MYELOMA IN RELAPSE: Multiple myeloma is a type of blood cancer. Continue your follow-up with your oncologist, Dr. Rockey, for management of this condition.  -TOBACCO DEPENDENCE: Tobacco dependence is an addiction to tobacco products. We encourage you to continue your efforts to quit smoking or reduce your tobacco use.  -GENERAL HEALTH MAINTENANCE: You received a flu shot today and have completed cataract surgery. We have requested an eye exam report from Dr. Eyvonne and scheduled your overdue Medicare wellness visit over the phone.  INSTRUCTIONS: Please continue with your current medications as discussed. Follow up with your oncologist, Dr. Rockey, for your multiple myeloma. We will monitor your kidney function regularly. Try to quit or reduce smoking. Attend your  scheduled Medicare wellness visit and provide the eye exam report from Dr. Eyvonne when available.                      Contains text generated by [employer]bridge.                                 Contains text generated by Abridge.

## 2024-06-11 ENCOUNTER — Other Ambulatory Visit: Payer: Self-pay

## 2024-06-16 ENCOUNTER — Other Ambulatory Visit: Payer: Self-pay

## 2024-06-17 ENCOUNTER — Other Ambulatory Visit: Payer: Self-pay

## 2024-06-17 ENCOUNTER — Other Ambulatory Visit: Payer: Self-pay | Admitting: Internal Medicine

## 2024-06-17 MED ORDER — INSUPEN PEN NEEDLES 32G X 4 MM MISC
1.0000 | Freq: Two times a day (BID) | 1 refills | Status: AC
  Filled 2024-06-17: qty 100, 50d supply, fill #0

## 2024-06-18 ENCOUNTER — Inpatient Hospital Stay: Attending: Hematology

## 2024-06-18 ENCOUNTER — Inpatient Hospital Stay (HOSPITAL_BASED_OUTPATIENT_CLINIC_OR_DEPARTMENT_OTHER): Admitting: Hematology

## 2024-06-18 VITALS — BP 153/61 | HR 58 | Temp 97.5°F | Resp 18 | Wt 160.5 lb

## 2024-06-18 DIAGNOSIS — C9002 Multiple myeloma in relapse: Secondary | ICD-10-CM | POA: Insufficient documentation

## 2024-06-18 DIAGNOSIS — Z5111 Encounter for antineoplastic chemotherapy: Secondary | ICD-10-CM | POA: Diagnosis not present

## 2024-06-18 DIAGNOSIS — Z7189 Other specified counseling: Secondary | ICD-10-CM

## 2024-06-18 DIAGNOSIS — E1122 Type 2 diabetes mellitus with diabetic chronic kidney disease: Secondary | ICD-10-CM | POA: Insufficient documentation

## 2024-06-18 DIAGNOSIS — N183 Chronic kidney disease, stage 3 unspecified: Secondary | ICD-10-CM | POA: Diagnosis not present

## 2024-06-18 DIAGNOSIS — E860 Dehydration: Secondary | ICD-10-CM | POA: Diagnosis not present

## 2024-06-18 DIAGNOSIS — Z5112 Encounter for antineoplastic immunotherapy: Secondary | ICD-10-CM | POA: Insufficient documentation

## 2024-06-18 DIAGNOSIS — I129 Hypertensive chronic kidney disease with stage 1 through stage 4 chronic kidney disease, or unspecified chronic kidney disease: Secondary | ICD-10-CM | POA: Diagnosis not present

## 2024-06-18 DIAGNOSIS — K5900 Constipation, unspecified: Secondary | ICD-10-CM | POA: Insufficient documentation

## 2024-06-18 LAB — CMP (CANCER CENTER ONLY)
ALT: 27 U/L (ref 0–44)
AST: 25 U/L (ref 15–41)
Albumin: 4.2 g/dL (ref 3.5–5.0)
Alkaline Phosphatase: 50 U/L (ref 38–126)
Anion gap: 9 (ref 5–15)
BUN: 14 mg/dL (ref 8–23)
CO2: 27 mmol/L (ref 22–32)
Calcium: 9.9 mg/dL (ref 8.9–10.3)
Chloride: 107 mmol/L (ref 98–111)
Creatinine: 1.67 mg/dL — ABNORMAL HIGH (ref 0.61–1.24)
GFR, Estimated: 42 mL/min — ABNORMAL LOW (ref >60)
Glucose, Bld: 99 mg/dL (ref 70–99)
Potassium: 3.8 mmol/L (ref 3.5–5.1)
Sodium: 142 mmol/L (ref 135–145)
Total Bilirubin: 0.5 mg/dL (ref 0.0–1.2)
Total Protein: 6.5 g/dL (ref 6.5–8.1)

## 2024-06-18 LAB — CBC WITH DIFFERENTIAL (CANCER CENTER ONLY)
Abs Immature Granulocytes: 0.01 K/uL (ref 0.00–0.07)
Basophils Absolute: 0.1 K/uL (ref 0.0–0.1)
Basophils Relative: 2 %
Eosinophils Absolute: 0.1 K/uL (ref 0.0–0.5)
Eosinophils Relative: 3 %
HCT: 28.7 % — ABNORMAL LOW (ref 39.0–52.0)
Hemoglobin: 9.6 g/dL — ABNORMAL LOW (ref 13.0–17.0)
Immature Granulocytes: 0 %
Lymphocytes Relative: 38 %
Lymphs Abs: 1.5 K/uL (ref 0.7–4.0)
MCH: 32.8 pg (ref 26.0–34.0)
MCHC: 33.4 g/dL (ref 30.0–36.0)
MCV: 98 fL (ref 80.0–100.0)
Monocytes Absolute: 0.6 K/uL (ref 0.1–1.0)
Monocytes Relative: 15 %
Neutro Abs: 1.7 K/uL (ref 1.7–7.7)
Neutrophils Relative %: 42 %
Platelet Count: 186 K/uL (ref 150–400)
RBC: 2.93 MIL/uL — ABNORMAL LOW (ref 4.22–5.81)
RDW: 15.2 % (ref 11.5–15.5)
WBC Count: 4 K/uL (ref 4.0–10.5)
nRBC: 0 % (ref 0.0–0.2)

## 2024-06-18 MED ORDER — FAMOTIDINE 20 MG PO TABS
20.0000 mg | ORAL_TABLET | Freq: Once | ORAL | Status: AC
  Administered 2024-06-18: 20 mg via ORAL
  Filled 2024-06-18: qty 1

## 2024-06-18 MED ORDER — DEXAMETHASONE 6 MG PO TABS
20.0000 mg | ORAL_TABLET | Freq: Once | ORAL | Status: AC
  Administered 2024-06-18: 20 mg via ORAL
  Filled 2024-06-18: qty 2

## 2024-06-18 MED ORDER — DIPHENHYDRAMINE HCL 25 MG PO CAPS
50.0000 mg | ORAL_CAPSULE | Freq: Once | ORAL | Status: AC
  Administered 2024-06-18: 50 mg via ORAL
  Filled 2024-06-18: qty 2

## 2024-06-18 MED ORDER — DARATUMUMAB-HYALURONIDASE-FIHJ 1800-30000 MG-UT/15ML ~~LOC~~ SOLN
1800.0000 mg | Freq: Once | SUBCUTANEOUS | Status: AC
  Administered 2024-06-18: 1800 mg via SUBCUTANEOUS
  Filled 2024-06-18: qty 15

## 2024-06-18 MED ORDER — ACETAMINOPHEN 325 MG PO TABS
650.0000 mg | ORAL_TABLET | Freq: Once | ORAL | Status: AC
  Administered 2024-06-18: 650 mg via ORAL
  Filled 2024-06-18: qty 2

## 2024-06-18 NOTE — Progress Notes (Signed)
 HEMATOLOGY ONCOLOGY PROGRESS NOTE  Date of service: 06/18/2024  Patient Care Team: Vicci Barnie NOVAK, MD as PCP - General (Internal Medicine)  CHIEF COMPLAINT/PURPOSE OF CONSULTATION: Follow-up for continued evaluation and management of multiple myeloma  HISTORY OF PRESENTING ILLNESS: Please see previous note for details of initial presentation.    SUMMARY OF ONCOLOGIC HISTORY: Oncology History  Multiple myeloma in remission (HCC)  02/16/2017 Imaging   Skeletal Survey: No definite lytic myelomatous lesions in the axial or appendicular skeleton   02/23/2017 Initial Diagnosis   Multiple myeloma not having achieved remission (HCC)   03/09/2017 Imaging   PET-CT: No abnormal hypermetabolic activity or other specific findings of active myeloma. Several adjacent healing left lateral rib fractures.   03/13/2017 Pathology Results   Bone Marrow Bx: HYPERCELLULAR BONE MARROW FOR AGE WITH PLASMA CELL NEOPLASM. TRILINEAGE HEMATOPOIESIS. NORMOCYTIC-NORMOCHROMIA ANEMIA. EOSINOPHILIA. The bone marrow shows slight increase in atypical plasma cells representing 7% of all cells associated with small clusters in the clot and biopsy sections. Immunohistochemical stains highlight the plasma cell component in the bone marrow which shows lambda light chain restriction consistent with plasma cell neoplasm. The background shows trilineage hematopoiesis with non-specific myeloid changes. The peripheral blood shows eosinophilia without an associated increase in eosinophils in the bone marrow. The changes may represent a hypersensitivity reaction. Cytogenetics/FISH: 46,XY -- Positive for +11, +17/17p+ -- extra CCND1 copy in 9% cells, gain of ATM copy in 9% & 11% cells with p53 gain.     03/16/2017 -  Chemotherapy   Induction chemotherapy: Lenalidomide  10mg  PO Qday, d1-14 + Bortezomib  1.3mg /m2 IV d1,4,8,11 + dexamethasone  20mg  PO QWk Q21d --Cycle #1, 03/16/17: Complicated by grade 2 rash within 1 week of initiation,  as well as a aggressive rising creatinine; d11 held due to worsening rash --Cycle #2, 04/13/17: Initiated without lenalidomide  due to persistent Grade 3 anemia --Cycle #3, 05/18/17: dosing changed to weekly bortezomib , cycle duration extended to 4 weeks --Cycle #4, 06/15/17: --Cycle #5, 07/13/17: --Cycle #6, 08/10/17:    04/04/2017 Cancer Staging   Staging form: Plasma Cell Myeloma and Plasma Cell Disorders, AJCC 8th Edition - Clinical: LDH: Unknown - Signed by Onesimo Emaline Brink, MD on 07/18/2023 Stage prefix: Post-therapy   06/08/2017 Pathology Results   BM Bx:  --Pathology: Plasma cells are down to 1% with polyclonal appearance. --CytoGen: Normal, 46,XY   06/15/2017 Tumor Marker   tProt 7.1, Alb 3.8, Ca 9.7, Cr 1.6, AP ... LDH 153, beta-2  microglobulin 3.3 SPEP -- No M-spike; SIFE -- normal IgG 688, IgA 105, IgM 47; kappa 50.7, lambda 19.3, KLR 2.63 WBC 3.6, ANC 2.1, Hgb 9.4, Plt 204   08/10/2017 Tumor Marker   tProt 7.0, Alb 3.9, Ca 9.5, Cr 2.3, AP 172 LDH 144, beta-2  microglobulin 3.6 SPEP -- no M-spike; SIFE -- WNL IgG 771, IgA 123, IgM 55; kappa 43.0, lambda 20.5, KLR 2.10 WBC 6.5, ANC 5.1, Hgb 9.4, Plt 228   08/29/2017 Remission   BM Bx: HYPERCELLULAR BONE MARROW FOR AGE WITH TRILINEAGE HEMATOPOIESIS AND 1% PLASMA CELLS. The bone marrow is hypercellular for age with trilineage hematopoiesis but with slight erythroid hyperplasia. There are nonspecific myeloid changes present likely related to previous therapy. The plasma cells represent 1% of all cells in the aspirate with lack of large aggregates and display polyclonal staining pattern for kappa and lambda light chains. Hence, there is no definite evidence of residual plasma cell neoplasm in this material.  --CytoGen: No clonal abnormalities --FISH: normal results   09/21/2017 -  Chemotherapy  Maintenance Bortezomib  1.3mg /m2 SQ Q14d    11/14/2017 Tumor Marker   tProt 6.8, Alb 3.8, Ca 9.5, Cr 1.6, AP 112 LDH 161, beta-2   microglobulin 2.3 SPEP -- no M-Spike; SIFE -- WNL IgG 804, IgA 123, IgM 66; kappa 50.8, lambda 18.7, KLR 2.72 WBC 6.3, Hgb 9.8, Plt 217    Multiple myeloma in relapse (HCC)  06/24/2021 Initial Diagnosis   Multiple myeloma in relapse (HCC)   07/14/2021 - 04/20/2022 Chemotherapy   Patient is on Treatment Plan : MYELOMA Daratumumab  + Pomalidomide  + Dexamethasone  q28d x 7 cycles     05/17/2022 - 02/28/2023 Chemotherapy   Patient is on Treatment Plan : MYELOMA Daratumumab  IV q28d     03/15/2023 -  Chemotherapy   Patient is on Treatment Plan : MYELOMA Daratumumab  SQ q28d       INTERVAL HISTORY: Robert Sparks is a 78 y.o. male who is here today for continued evaluation and management of multiple myeloma.   He was last seen by me on 04/23/2024; at the time he mentioned experiencing lower back pain when walking/moving around, occasional diarrhea (has bowel movements 3x during those days), worsening pain from the bunion on his great toe (being followed by Podiatry), and has been off balance.  Patient had to skip his last treatment in mid November due to severe diarrhea thought to be from a viral gastroenteritis.  Patient notes that diarrhea resolved and he is eating well and now notes some mild constipation. Has been able to continue taking his pomalidomide  without any new notable toxicities. Overall has been feeling well other than a mild runny nose as of yesterday ,12/09.  Denies fevers/chills, sinus pain, abdominal pain, leg swelling, and night sweats.   He is up to date with his vaccines.   REVIEW OF SYSTEMS:   10 Point review of systems of done and is negative except as noted above.  MEDICAL HISTORY Past Medical History:  Diagnosis Date   Anemia    Arthritis    shoulders,feet    Cataract    per eye dr -has appt 5-11   CKD (chronic kidney disease), stage III (HCC)    Elevated PSA    History of ketoacidosis    03-29-2015    History of sepsis    07-25-2013  non-compliant w/  medication   Hyperlipidemia    Hypertension    Nocturia    Peripheral neuropathy    Prostate cancer (HCC)    Type 2 diabetes mellitus with insulin  therapy [facility])     SURGICAL HISTORY Past Surgical History:  Procedure Laterality Date   CATARACT EXTRACTION Bilateral 04/2024   Dr. Octavia   CIRCUMCISION  03/10/1989   PROSTATE BIOPSY N/A 12/21/2015   Procedure: BIOPSY TRANSRECTAL ULTRASONIC PROSTATE (TUBP);  Surgeon: Donnice Brooks, MD;  Location: [facility];  Service: Urology;  Laterality: N/A;    SOCIAL HISTORY Social History   Tobacco Use   Smoking status: Every Day    Current packs/day: 0.33    Average packs/day: 0.3 packs/day for 50.0 years (16.5 ttl pk-yrs)    Types: Cigarettes   Smokeless tobacco: Never   Tobacco comments:    1 ppwk-4-5 a day   Vaping Use   Vaping status: Never Used  Substance Use Topics   Alcohol use: Yes    Alcohol/week: 1.0 standard drink of alcohol    Types: 1 Cans of beer per week    Comment: a beer every now and again   Drug use: No  Social History   Social History Narrative   Not on file    SOCIAL DRIVERS OF HEALTH SDOH Screenings   Food Insecurity: No Food Insecurity (06/10/2024)  Housing: Low Risk  (06/10/2024)  Transportation Needs: No Transportation Needs (06/10/2024)  Utilities: Not At Risk (06/10/2024)  Alcohol Screen: Low Risk  (06/10/2024)  Depression (PHQ2-9): Low Risk  (02/27/2024)  Financial Resource Strain: Low Risk  (06/10/2024)  Physical Activity: Inactive (06/10/2024)  Social Connections: Moderately Isolated (05/11/2023)  Stress: No Stress Concern Present (06/10/2024)  Tobacco Use: High Risk (06/10/2024)  Health Literacy: Adequate Health Literacy (06/10/2024)     FAMILY HISTORY Family History  Problem Relation Age of Onset   Kidney disease Mother    Cancer Neg Hx    Colon cancer Neg Hx    Colon polyps Neg Hx    Esophageal cancer Neg Hx    Rectal cancer Neg Hx    Stomach cancer Neg Hx       ALLERGIES: is allergic to other and lisinopril .  MEDICATIONS  Current Outpatient Medications  Medication Sig Dispense Refill   Accu-Chek Softclix Lancets lancets Use to test blood sugar 3 times daily. 100 each 3   acetaminophen  (TYLENOL ) 500 MG tablet Take 2 tablets (1,000 mg total) by mouth every 6 (six) hours as needed. 30 tablet 0   acyclovir  (ZOVIRAX ) 400 MG tablet Take 1 tablet (400 mg total) by mouth 2 (two) times daily. 60 tablet 11   aspirin  EC 81 MG tablet Take 81 mg by mouth daily. Swallow whole.     b complex vitamins capsule Take 1 capsule by mouth daily. 30 capsule 5   Blood Glucose Monitoring Suppl (ACCU-CHEK GUIDE) w/Device KIT Use to test blood sugar three times daily 1 kit 0   buPROPion  (WELLBUTRIN ) 75 MG tablet Take 1 tablet (75 mg total) by mouth 2 (two) times daily. To reduce urge of smoking. 60 tablet 3   cetirizine  (ZYRTEC ) 5 MG tablet Take 1 tablet (5 mg total) by mouth at bedtime. 30 tablet 1   Continuous Glucose Receiver (FREESTYLE LIBRE 3 READER) DEVI Use as directed to check blood sugars. 1 each 0   Continuous Glucose Sensor (FREESTYLE LIBRE 3 PLUS SENSOR) MISC Change sensor every 15 days. 2 each 11   donepezil  (ARICEPT ) 5 MG tablet TAKE 1 TABLET (5 MG TOTAL) BY MOUTH AT BEDTIME. FOR MEMORY ISSUES 30 tablet 5   fluticasone  (FLONASE ) 50 MCG/ACT nasal spray Place 2 sprays into both nostrils daily. 16 g 2   gabapentin  (NEURONTIN ) 300 MG capsule Take 1 capsule (300 mg total) by mouth every morning AND 1 capsule (300 mg total) daily at 12 noon AND 2 capsules (600 mg total) at bedtime. Stop Lyrica . 120 capsule 11   glucose blood (ACCU-CHEK GUIDE TEST) test strip Use to test blood sugar 3 times daily. 100 each 2   Insulin  Lispro Prot & Lispro (HUMALOG  MIX 75/25 KWIKPEN) (75-25) 100 UNIT/ML Kwikpen Inject 9 units subcutaneously with breakfast and dinner. Increase to 12 units for 3-5 days twice a month after dexamethasone /decadron . 15 mL 11   Insulin  Pen Needle (INSUPEN  PEN NEEDLES) 32G X 4 MM MISC USE AS DIRECTED TWICE DAILY WITH INSULIN  100 each 1   ketorolac  (ACULAR ) 0.5 % ophthalmic solution Place 1 drop into the right eye in the morning, at noon, in the evening, and at bedtime. Start the day before surgery. 5 mL 1   Multiple Vitamin (MULTIVITAMIN WITH MINERALS) TABS tablet Take 1 tablet by mouth daily. 60  tablet 2   ofloxacin  (OCUFLOX ) 0.3 % ophthalmic solution Place 1 drop into the right eye 4 (four) times daily. Start one day before surgery. 5 mL 1   ondansetron  (ZOFRAN ) 8 MG tablet Take 1 tablet (8 mg total) by mouth 2 (two) times daily as needed (Nausea or vomiting). 30 tablet 1   pomalidomide  (POMALYST ) 2 MG capsule Take 1 capsule by mouth once daily for 21 days, then 7 days off. 21 capsule 0   potassium chloride  SA (KLOR-CON  M) 20 MEQ tablet Take 1 tablet (20 mEq total) by mouth daily. 30 tablet 1   pravastatin  (PRAVACHOL ) 20 MG tablet Take 1 tablet (20 mg total) by mouth daily. 90 tablet 1   prednisoLONE  acetate (PRED FORTE ) 1 % ophthalmic suspension Place 1 drop into the left eye 3 (three) times daily. 10 mL 0   prochlorperazine  (COMPAZINE ) 10 MG tablet Take 1 tablet (10 mg total) by mouth every 6 (six) hours as needed (Nausea or vomiting). 30 tablet 1   valsartan  (DIOVAN ) 40 MG tablet Take 1 tablet (40 mg total) by mouth daily. STOP LISINOPRIL  90 tablet 3   Vitamin D , Ergocalciferol , (DRISDOL ) 1.25 MG (50000 UNIT) CAPS capsule Take 1 capsule (50,000 Units total) by mouth once a week. 17 capsule 3   No current facility-administered medications for this visit.    PHYSICAL EXAMINATION: ECOG PERFORMANCE STATUS: 1 - Symptomatic but completely ambulatory VITALS: Vitals:   06/18/24 1148 06/18/24 1158  BP: (!) 154/65 (!) 153/61  Pulse: (!) 58   Resp: 18   Temp: (!) 97.5 F (36.4 C)   SpO2: 98%    Filed Weights   06/18/24 1148  Weight: 160 lb 8 oz (72.8 kg)   Body mass index is 25.91 kg/m.  GENERAL: alert, in no acute distress and  comfortable SKIN: no acute rashes, no significant lesions EYES: conjunctiva are pink and non-injected, sclera anicteric OROPHARYNX: MMM, no exudates, no oropharyngeal erythema or ulceration NECK: supple, no JVD LYMPH:  no palpable lymphadenopathy in the cervical, axillary or inguinal regions LUNGS: clear to auscultation b/l with normal respiratory effort HEART: regular rate & rhythm ABDOMEN:  normoactive bowel sounds , non tender, not distended, no hepatosplenomegaly Extremity: no pedal edema PSYCH: alert & oriented x 3 with fluent speech NEURO: no focal motor/sensory deficits  LABORATORY DATA:   I have reviewed the data as listed     Latest Ref Rng & Units 06/18/2024   10:30 AM 04/23/2024    9:45 AM 03/26/2024   10:38 AM  CBC EXTENDED  WBC 4.0 - 10.5 K/uL 4.0  3.5  3.1   RBC 4.22 - 5.81 MIL/uL 2.93  3.42  3.26   Hemoglobin 13.0 - 17.0 g/dL 9.6  88.9  89.6   HCT 60.9 - 52.0 % 28.7  32.6  31.0   Platelets 150 - 400 K/uL 186  246  216   NEUT# 1.7 - 7.7 K/uL 1.7  1.8  1.3   Lymph# 0.7 - 4.0 K/uL 1.5  1.3  1.1         Latest Ref Rng & Units 06/18/2024   10:30 AM 04/23/2024    9:45 AM 03/26/2024   10:38 AM  CMP  Glucose 70 - 99 mg/dL 99  813  801   BUN 8 - 23 mg/dL 14  16  14    Creatinine 0.61 - 1.24 mg/dL 8.32  8.62  8.50   Sodium 135 - 145 mmol/L 142  139  140   Potassium 3.5 -  5.1 mmol/L 3.8  4.3  3.9   Chloride 98 - 111 mmol/L 107  109  107   CO2 22 - 32 mmol/L 27  25  27    Calcium  8.9 - 10.3 mg/dL 9.9  9.6  9.8   Total Protein 6.5 - 8.1 g/dL 6.5  6.5  6.5   Total Bilirubin 0.0 - 1.2 mg/dL 0.5  0.5  0.5   Alkaline Phos 38 - 126 U/L 50  49  65   AST 15 - 41 U/L 25  17  13    ALT 0 - 44 U/L 27  20  15     Multiple Myeloma Panel  Component     Latest Ref Rng 04/23/2024  IgG (Immunoglobin G), Serum     603 - 1,613 mg/dL 396   IgA     61 - 562 mg/dL 890   IgM (Immunoglobulin M), Srm     15 - 143 mg/dL 27   Total Protein ELP     6.0 - 8.5 g/dL 6.0 (C)  Albumin  SerPl Elph-Mcnc     2.9 - 4.4 g/dL 3.6 (C)  Alpha 1     0.0 - 0.4 g/dL 0.2 (C)  Alpha2 Glob SerPl Elph-Mcnc     0.4 - 1.0 g/dL 0.8 (C)  B-Globulin SerPl Elph-Mcnc     0.7 - 1.3 g/dL 0.9 (C)  Gamma Glob SerPl Elph-Mcnc     0.4 - 1.8 g/dL 0.4 (C)  M Protein SerPl Elph-Mcnc     Not Observed g/dL Not Observed (C)  Globulin, Total     2.2 - 3.9 g/dL 2.4 (C)  Albumin/Glob SerPl     0.7 - 1.7  1.6 (C)  IFE 1 Comment (C)  Please Note (HCV): Comment (C)    Legend: (C) Corrected  Kappa/Lambda Light Chains Component     Latest Ref Rng 04/23/2024  Kappa free light chain     3.3 - 19.4 mg/L 45.7 (H)   Lambda free light chains     5.7 - 26.3 mg/L 24.9   Kappa, lambda light chain ratio     0.26 - 1.65  1.84 (H)     Legend: (H) High   08/29/17 BM Bx:      08/29/17 Cytogenetics:    03/13/17 Cytogenetics:             PATHOLOGY Surgical Pathology  CASE: [id number]  PATIENT: Robert Sparks  Bone Marrow Report      Clinical History: Multiple myeloma in relapse (HCC) ,(BH)      DIAGNOSIS:   BONE MARROW, ASPIRATE, CLOT, CORE:  -Hypercellular bone marrow for age with plasma cell neoplasm  -See comment   PERIPHERAL BLOOD:  -Mild normocytic-normochromic anemia  -Eosinophilia   COMMENT:   The bone marrow is hypercellular for age with increased number of plasma  cells representing 8% of all cells in the aspirate associated with  numerous small clusters in the clot and biopsy sections. The plasma  cells display lambda light chain restriction consistent with plasma cell  neoplasm.  The background shows trilineage hematopoiesis with generally  nonspecific myeloid changes, likely secondary in nature in this setting.  Correlation with cytogenetic and FISH studies is recommended.      RADIOGRAPHIC STUDIES: I have personally reviewed the radiological images as listed and agreed with the findings in the report. No results found.  ASSESSMENT & PLAN:  78 y.o. male  with  1.  Relapsed IgA Lambda Multiple Myeloma ISS Stage I  Active disease was previously diagnosed based on presence of anemia, kidney insufficiency, and paraproteinemia with significant predominance of lambda light chains as well as significant elevation of IgA. Patient is status post patient was treated with induction bortezomib  Revlimid  dexamethasone .  Had issues with skin rashes with Revlimid  and this was discontinued.  Completed bortezomib  dexamethasone  induction and achieved complete remission. Was on maintenance Velcade  2 weeks which was then switched to maintenance Ninlaro . Patient had issues with worsening neuropathy which was a combination of Velcade  and his diabetes.  Ninlaro  was discontinued as well after maintenance of more than 2 years.   2.  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 Bone marrow biopsy shows 8% lambda restricted plasma cells PET CT scan with no hypermetabolic osseous lesions   3.  Hypertension 4.  Diabetes type 2 5. chronic kidney disease due to hypertension and diabetes. 6.  Peripheral neuropathy related to diabetes and previous myeloma treatments.  PLAN: - Discussed lab results on 06/18/2024 in detail with patient - CBC shows stable mild anemia with a hemoglobin of 9.6 with normal WBC count and platelets. CMP shows stable chronic kidney disease with a creatinine of 1.67.  He was noted to have some mild dehydration and has recommended increase p.o. fluid intake. -His myeloma labs from October 2025 showed that he is still in remission on his current maintenance treatment. - Multiple myeloma panel and kappa/lambda light chain panel 06/18/2024 are currently pending. -He notes no overt toxicities from his current monthly daratumumab  Faspro or from his oral pomalidomide . - Continue monthly daratumumab  Faspro subcu per integrated scheduling with current supportive  medications - Continue acyclovir  for VZV prophylaxis -Continue aspirin  for VTE prophylaxis - Continue close follow-up with PCP for management of further chronic medical issues - He was recommended to call us  immediately for any infectious symptoms worsening cough or cold.  FOLLOW-UP  Continue monthly daratumumab  Faspro subcu per integrated scheduling MD visit in 2 months   The total time spent in the appointment was 32 minutes* .  All of the patient's questions were answered and the patient knows to call the clinic with any problems, questions, or concerns.  Emaline Saran MD MS AAHIVMS [facility] Hematology/Oncology Physician [facility]  *Total Encounter Time as defined by the Centers for Medicare and Medicaid Services includes, in addition to the face-to-face time of a patient visit (documented in the note above) non-face-to-face time: obtaining and reviewing outside history, ordering and reviewing medications, tests or procedures, care coordination (communications with other health care professionals or caregivers) and documentation in the medical record.  I, Marijo Sharps, acting as a neurosurgeon for Emaline Saran, MD.,have documented all relevant documentation on the behalf of Emaline Saran, MD,as directed by  Emaline Saran, MD while in the presence of Emaline Saran, MD.  I have reviewed the above documentation for accuracy and completeness, and I agree with the above.  Zayden Maffei, MD

## 2024-06-18 NOTE — Patient Instructions (Signed)
 [facility] - A DEPT OF [facility]  Discharge Instructions: Thank you for choosing [facility] to provide your oncology and hematology care.   If you have a lab appointment with the Cancer Center, please go directly to the Cancer Center and check in at the registration area.   Wear comfortable clothing and clothing appropriate for easy access to any Portacath or PICC line.   We strive to give you quality time with your provider. You may need to reschedule your appointment if you arrive late (15 or more minutes).  Arriving late affects you and other patients whose appointments are after yours.  Also, if you miss three or more appointments without notifying the office, you may be dismissed from the clinic at the provider's discretion.      For prescription refill requests, have your pharmacy contact our office and allow 72 hours for refills to be completed.    Today you received the following chemotherapy and/or immunotherapy agents: daratumumab-hyaluronidase-fihj      To help prevent nausea and vomiting after your treatment, we encourage you to take your nausea medication as directed.  BELOW ARE SYMPTOMS THAT SHOULD BE REPORTED IMMEDIATELY: *FEVER GREATER THAN 100.4 F (38 C) OR HIGHER *CHILLS OR SWEATING *NAUSEA AND VOMITING THAT IS NOT CONTROLLED WITH YOUR NAUSEA MEDICATION *UNUSUAL SHORTNESS OF BREATH *UNUSUAL BRUISING OR BLEEDING *URINARY PROBLEMS (pain or burning when urinating, or frequent urination) *BOWEL PROBLEMS (unusual diarrhea, constipation, pain near the anus) TENDERNESS IN MOUTH AND THROAT WITH OR WITHOUT PRESENCE OF ULCERS (sore throat, sores in mouth, or a toothache) UNUSUAL RASH, SWELLING OR PAIN  UNUSUAL VAGINAL DISCHARGE OR ITCHING   Items with * indicate a potential emergency and should be followed up as soon as possible or go to the Emergency Department if any problems should occur.  Please show the CHEMOTHERAPY ALERT  CARD or IMMUNOTHERAPY ALERT CARD at check-in to the Emergency Department and triage nurse.  Should you have questions after your visit or need to cancel or reschedule your appointment, please contact [facility] - A DEPT OF [facility]  Dept: [phone]  and follow the prompts.  Office hours are 8:00 a.m. to 4:30 p.m. Monday - Friday. Please note that voicemails left after 4:00 p.m. may not be returned until the following business day.  We are closed weekends and major holidays. You have access to a nurse at all times for urgent questions. Please call the main number to the clinic Dept: [phone] and follow the prompts.   For any non-urgent questions, you may also contact your provider using MyChart. We now offer e-Visits for anyone 37 and older to request care online for non-urgent symptoms. For details visit [email or web address].   Also download the MyChart app! Go to the app store, search "MyChart", open the app, select [facility], and log in with your MyChart username and password.

## 2024-06-19 ENCOUNTER — Other Ambulatory Visit: Payer: Self-pay

## 2024-06-19 LAB — KAPPA/LAMBDA LIGHT CHAINS
Kappa free light chain: 60.1 mg/L — ABNORMAL HIGH (ref 3.3–19.4)
Kappa, lambda light chain ratio: 1.8 — ABNORMAL HIGH (ref 0.26–1.65)
Lambda free light chains: 33.4 mg/L — ABNORMAL HIGH (ref 5.7–26.3)

## 2024-06-20 ENCOUNTER — Other Ambulatory Visit: Payer: Self-pay

## 2024-06-20 LAB — MULTIPLE MYELOMA PANEL, SERUM
Albumin SerPl Elph-Mcnc: 3.5 g/dL (ref 2.9–4.4)
Albumin/Glob SerPl: 1.5 (ref 0.7–1.7)
Alpha 1: 0.2 g/dL (ref 0.0–0.4)
Alpha2 Glob SerPl Elph-Mcnc: 0.7 g/dL (ref 0.4–1.0)
B-Globulin SerPl Elph-Mcnc: 0.9 g/dL (ref 0.7–1.3)
Gamma Glob SerPl Elph-Mcnc: 0.6 g/dL (ref 0.4–1.8)
Globulin, Total: 2.4 g/dL (ref 2.2–3.9)
IgA: 126 mg/dL (ref 61–437)
IgG (Immunoglobin G), Serum: 652 mg/dL (ref 603–1613)
IgM (Immunoglobulin M), Srm: 23 mg/dL (ref 15–143)
Total Protein ELP: 5.9 g/dL — ABNORMAL LOW (ref 6.0–8.5)

## 2024-06-23 ENCOUNTER — Other Ambulatory Visit: Payer: Self-pay

## 2024-07-11 ENCOUNTER — Other Ambulatory Visit: Payer: Self-pay

## 2024-07-14 ENCOUNTER — Other Ambulatory Visit: Payer: Self-pay

## 2024-07-14 DIAGNOSIS — E1142 Type 2 diabetes mellitus with diabetic polyneuropathy: Secondary | ICD-10-CM

## 2024-07-14 MED ORDER — ACCU-CHEK GUIDE TEST VI STRP
ORAL_STRIP | 6 refills | Status: AC
  Filled 2024-07-14: qty 100, 34d supply, fill #0

## 2024-07-15 ENCOUNTER — Encounter: Payer: Self-pay | Admitting: Podiatry

## 2024-07-15 ENCOUNTER — Other Ambulatory Visit: Payer: Self-pay

## 2024-07-15 ENCOUNTER — Ambulatory Visit (INDEPENDENT_AMBULATORY_CARE_PROVIDER_SITE_OTHER): Admitting: Podiatry

## 2024-07-15 DIAGNOSIS — C9002 Multiple myeloma in relapse: Secondary | ICD-10-CM

## 2024-07-15 DIAGNOSIS — L84 Corns and callosities: Secondary | ICD-10-CM

## 2024-07-15 DIAGNOSIS — B351 Tinea unguium: Secondary | ICD-10-CM | POA: Diagnosis not present

## 2024-07-15 DIAGNOSIS — N1831 Chronic kidney disease, stage 3a: Secondary | ICD-10-CM | POA: Diagnosis not present

## 2024-07-15 DIAGNOSIS — E1159 Type 2 diabetes mellitus with other circulatory complications: Secondary | ICD-10-CM

## 2024-07-15 DIAGNOSIS — M79675 Pain in left toe(s): Secondary | ICD-10-CM

## 2024-07-15 DIAGNOSIS — M79674 Pain in right toe(s): Secondary | ICD-10-CM

## 2024-07-15 MED ORDER — POMALIDOMIDE 2 MG PO CAPS: 2.0000 mg | ORAL_CAPSULE | Freq: Every day | ORAL | 0 refills | Status: DC

## 2024-07-15 MED ORDER — POMALIDOMIDE 2 MG PO CAPS: 2.0000 mg | ORAL_CAPSULE | Freq: Every day | ORAL | Status: DC

## 2024-07-15 NOTE — Progress Notes (Signed)
 This patient presents to the office with chief complaint of long thick painful nails.  Patient says the nails are painful walking and wearing shoes.  This patient is unable to self treat.  This patient is unable to trim his nails since he is unable to reach his nails.  He also has painful callus when walking.he presents to the office for preventative foot care services.  General Appearance  Alert, conversant and in no acute stress.  Vascular  Dorsalis pedis and posterior tibial  pulses are  weakly palpable  bilaterally.  Capillary return is within normal limits  bilaterally. Temperature is within normal limits  bilaterally.  Neurologic  Senn-Weinstein monofilament wire test within normal limits  bilaterally. Muscle power within normal limits bilaterally.  Nails Thick disfigured discolored nails with subungual debris  from hallux to fifth toes bilaterally. No evidence of bacterial infection or drainage bilaterally.  Orthopedic  No limitations of motion  feet .  No crepitus or effusions noted.  No bony pathology or digital deformities noted.  Skin  normotropic skin with no porokeratosis noted bilaterally.  No signs of infections or ulcers noted.   Callus plantar right hallux.  Onychomycosis  Nails  B/L.  Pain in right toes  Pain in left toes  Callus right hallux.  Debridement of nails both feet followed trimming the nails with dremel tool.  Debride callus with # 15 blade and dremel tool. RTC 9   weeks    Cordella Bold DPM

## 2024-07-15 NOTE — Progress Notes (Signed)
 Received message from RN that pomalyst  has fallen off patients medication list. Medication had an end date, I have removed end date and have re-activated.   Aleece Loyd, PharmD Hematology/Oncology Clinical Pharmacist [facility] [phone]

## 2024-07-16 ENCOUNTER — Inpatient Hospital Stay: Attending: Hematology

## 2024-07-16 ENCOUNTER — Other Ambulatory Visit: Payer: Self-pay

## 2024-07-16 ENCOUNTER — Inpatient Hospital Stay

## 2024-07-16 VITALS — BP 129/62 | HR 61 | Temp 97.7°F | Resp 17 | Wt 162.2 lb

## 2024-07-16 DIAGNOSIS — C9002 Multiple myeloma in relapse: Secondary | ICD-10-CM

## 2024-07-16 DIAGNOSIS — Z7189 Other specified counseling: Secondary | ICD-10-CM

## 2024-07-16 DIAGNOSIS — Z5112 Encounter for antineoplastic immunotherapy: Secondary | ICD-10-CM | POA: Diagnosis present

## 2024-07-16 LAB — CBC WITH DIFFERENTIAL (CANCER CENTER ONLY)
Abs Immature Granulocytes: 0 K/uL (ref 0.00–0.07)
Basophils Absolute: 0.1 K/uL (ref 0.0–0.1)
Basophils Relative: 2 %
Eosinophils Absolute: 0.2 K/uL (ref 0.0–0.5)
Eosinophils Relative: 5 %
HCT: 30.8 % — ABNORMAL LOW (ref 39.0–52.0)
Hemoglobin: 10.5 g/dL — ABNORMAL LOW (ref 13.0–17.0)
Immature Granulocytes: 0 %
Lymphocytes Relative: 46 %
Lymphs Abs: 1.5 K/uL (ref 0.7–4.0)
MCH: 32.7 pg (ref 26.0–34.0)
MCHC: 34.1 g/dL (ref 30.0–36.0)
MCV: 96 fL (ref 80.0–100.0)
Monocytes Absolute: 0.5 K/uL (ref 0.1–1.0)
Monocytes Relative: 13 %
Neutro Abs: 1.1 K/uL — ABNORMAL LOW (ref 1.7–7.7)
Neutrophils Relative %: 34 %
Platelet Count: 196 K/uL (ref 150–400)
RBC: 3.21 MIL/uL — ABNORMAL LOW (ref 4.22–5.81)
RDW: 14.4 % (ref 11.5–15.5)
WBC Count: 3.4 K/uL — ABNORMAL LOW (ref 4.0–10.5)
nRBC: 0 % (ref 0.0–0.2)

## 2024-07-16 LAB — CMP (CANCER CENTER ONLY)
ALT: 16 U/L (ref 0–44)
AST: 17 U/L (ref 15–41)
Albumin: 4.2 g/dL (ref 3.5–5.0)
Alkaline Phosphatase: 64 U/L (ref 38–126)
Anion gap: 9 (ref 5–15)
BUN: 12 mg/dL (ref 8–23)
CO2: 25 mmol/L (ref 22–32)
Calcium: 9.4 mg/dL (ref 8.9–10.3)
Chloride: 107 mmol/L (ref 98–111)
Creatinine: 1.41 mg/dL — ABNORMAL HIGH (ref 0.61–1.24)
GFR, Estimated: 52 mL/min — ABNORMAL LOW
Glucose, Bld: 214 mg/dL — ABNORMAL HIGH (ref 70–99)
Potassium: 3.6 mmol/L (ref 3.5–5.1)
Sodium: 141 mmol/L (ref 135–145)
Total Bilirubin: 0.6 mg/dL (ref 0.0–1.2)
Total Protein: 6.6 g/dL (ref 6.5–8.1)

## 2024-07-16 MED ORDER — FAMOTIDINE 20 MG PO TABS
20.0000 mg | ORAL_TABLET | Freq: Once | ORAL | Status: AC
  Administered 2024-07-16: 20 mg via ORAL
  Filled 2024-07-16: qty 1

## 2024-07-16 MED ORDER — DIPHENHYDRAMINE HCL 25 MG PO CAPS
50.0000 mg | ORAL_CAPSULE | Freq: Once | ORAL | Status: AC
  Administered 2024-07-16: 50 mg via ORAL
  Filled 2024-07-16: qty 2

## 2024-07-16 MED ORDER — DARATUMUMAB-HYALURONIDASE-FIHJ 1800-30000 MG-UT/15ML ~~LOC~~ SOLN
1800.0000 mg | Freq: Once | SUBCUTANEOUS | Status: AC
  Administered 2024-07-16: 1800 mg via SUBCUTANEOUS
  Filled 2024-07-16: qty 15

## 2024-07-16 MED ORDER — ACETAMINOPHEN 325 MG PO TABS
650.0000 mg | ORAL_TABLET | Freq: Once | ORAL | Status: AC
  Administered 2024-07-16: 650 mg via ORAL
  Filled 2024-07-16: qty 2

## 2024-07-16 MED ORDER — DEXAMETHASONE 6 MG PO TABS
20.0000 mg | ORAL_TABLET | Freq: Once | ORAL | Status: AC
  Administered 2024-07-16: 20 mg via ORAL
  Filled 2024-07-16: qty 2

## 2024-07-16 NOTE — Patient Instructions (Signed)

## 2024-07-17 LAB — KAPPA/LAMBDA LIGHT CHAINS
Kappa free light chain: 57.1 mg/L — ABNORMAL HIGH (ref 3.3–19.4)
Kappa, lambda light chain ratio: 1.78 — ABNORMAL HIGH (ref 0.26–1.65)
Lambda free light chains: 32.1 mg/L — ABNORMAL HIGH (ref 5.7–26.3)

## 2024-07-18 LAB — MULTIPLE MYELOMA PANEL, SERUM
Albumin SerPl Elph-Mcnc: 3.7 g/dL (ref 2.9–4.4)
Albumin/Glob SerPl: 1.7 (ref 0.7–1.7)
Alpha 1: 0.2 g/dL (ref 0.0–0.4)
Alpha2 Glob SerPl Elph-Mcnc: 0.7 g/dL (ref 0.4–1.0)
B-Globulin SerPl Elph-Mcnc: 0.9 g/dL (ref 0.7–1.3)
Gamma Glob SerPl Elph-Mcnc: 0.6 g/dL (ref 0.4–1.8)
Globulin, Total: 2.3 g/dL (ref 2.2–3.9)
IgA: 132 mg/dL (ref 61–437)
IgG (Immunoglobin G), Serum: 687 mg/dL (ref 603–1613)
IgM (Immunoglobulin M), Srm: 31 mg/dL (ref 15–143)
Total Protein ELP: 6 g/dL (ref 6.0–8.5)

## 2024-07-22 ENCOUNTER — Other Ambulatory Visit: Payer: Self-pay | Admitting: Internal Medicine

## 2024-07-22 ENCOUNTER — Other Ambulatory Visit: Payer: Self-pay

## 2024-07-23 ENCOUNTER — Other Ambulatory Visit: Payer: Self-pay

## 2024-07-23 MED ORDER — TIZANIDINE HCL 4 MG PO TABS
4.0000 mg | ORAL_TABLET | Freq: Every day | ORAL | 0 refills | Status: DC | PRN
  Filled 2024-07-23: qty 90, 90d supply, fill #0

## 2024-07-29 ENCOUNTER — Emergency Department (HOSPITAL_COMMUNITY)

## 2024-07-29 ENCOUNTER — Other Ambulatory Visit: Payer: Self-pay

## 2024-07-29 ENCOUNTER — Observation Stay (HOSPITAL_COMMUNITY)
Admission: EM | Admit: 2024-07-29 | Discharge: 2024-07-31 | Disposition: A | Discharge status: Home / Self Care | Attending: Internal Medicine | Admitting: Internal Medicine

## 2024-07-29 ENCOUNTER — Encounter (HOSPITAL_COMMUNITY): Payer: Self-pay | Admitting: Family Medicine

## 2024-07-29 DIAGNOSIS — N183 Chronic kidney disease, stage 3 unspecified: Secondary | ICD-10-CM | POA: Diagnosis present

## 2024-07-29 DIAGNOSIS — C9002 Multiple myeloma in relapse: Secondary | ICD-10-CM | POA: Insufficient documentation

## 2024-07-29 DIAGNOSIS — Z8546 Personal history of malignant neoplasm of prostate: Secondary | ICD-10-CM | POA: Insufficient documentation

## 2024-07-29 DIAGNOSIS — I10 Essential (primary) hypertension: Secondary | ICD-10-CM | POA: Diagnosis not present

## 2024-07-29 DIAGNOSIS — I129 Hypertensive chronic kidney disease with stage 1 through stage 4 chronic kidney disease, or unspecified chronic kidney disease: Secondary | ICD-10-CM | POA: Insufficient documentation

## 2024-07-29 DIAGNOSIS — E1122 Type 2 diabetes mellitus with diabetic chronic kidney disease: Secondary | ICD-10-CM | POA: Diagnosis not present

## 2024-07-29 DIAGNOSIS — F17210 Nicotine dependence, cigarettes, uncomplicated: Secondary | ICD-10-CM | POA: Diagnosis not present

## 2024-07-29 DIAGNOSIS — R531 Weakness: Principal | ICD-10-CM | POA: Insufficient documentation

## 2024-07-29 DIAGNOSIS — Z79899 Other long term (current) drug therapy: Secondary | ICD-10-CM | POA: Diagnosis not present

## 2024-07-29 DIAGNOSIS — Z794 Long term (current) use of insulin: Secondary | ICD-10-CM | POA: Insufficient documentation

## 2024-07-29 DIAGNOSIS — M6281 Muscle weakness (generalized): Secondary | ICD-10-CM | POA: Diagnosis not present

## 2024-07-29 DIAGNOSIS — R2689 Other abnormalities of gait and mobility: Secondary | ICD-10-CM | POA: Insufficient documentation

## 2024-07-29 DIAGNOSIS — C9001 Multiple myeloma in remission: Secondary | ICD-10-CM | POA: Diagnosis not present

## 2024-07-29 DIAGNOSIS — R319 Hematuria, unspecified: Secondary | ICD-10-CM

## 2024-07-29 DIAGNOSIS — E1142 Type 2 diabetes mellitus with diabetic polyneuropathy: Secondary | ICD-10-CM

## 2024-07-29 DIAGNOSIS — R413 Other amnesia: Secondary | ICD-10-CM

## 2024-07-29 DIAGNOSIS — Z7982 Long term (current) use of aspirin: Secondary | ICD-10-CM | POA: Diagnosis not present

## 2024-07-29 DIAGNOSIS — E1159 Type 2 diabetes mellitus with other circulatory complications: Secondary | ICD-10-CM

## 2024-07-29 DIAGNOSIS — N1831 Chronic kidney disease, stage 3a: Secondary | ICD-10-CM | POA: Diagnosis not present

## 2024-07-29 DIAGNOSIS — E1169 Type 2 diabetes mellitus with other specified complication: Secondary | ICD-10-CM

## 2024-07-29 LAB — COMPREHENSIVE METABOLIC PANEL WITH GFR
ALT: 17 U/L (ref 0–44)
AST: 28 U/L (ref 15–41)
Albumin: 4.2 g/dL (ref 3.5–5.0)
Alkaline Phosphatase: 57 U/L (ref 38–126)
Anion gap: 17 — ABNORMAL HIGH (ref 5–15)
BUN: 17 mg/dL (ref 8–23)
CO2: 21 mmol/L — ABNORMAL LOW (ref 22–32)
Calcium: 9.8 mg/dL (ref 8.9–10.3)
Chloride: 105 mmol/L (ref 98–111)
Creatinine, Ser: 1.15 mg/dL (ref 0.61–1.24)
GFR, Estimated: >60 mL/min
Glucose, Bld: 126 mg/dL — ABNORMAL HIGH (ref 70–99)
Potassium: 3.7 mmol/L (ref 3.5–5.1)
Sodium: 143 mmol/L (ref 135–145)
Total Bilirubin: 0.9 mg/dL (ref 0.0–1.2)
Total Protein: 7 g/dL (ref 6.5–8.1)

## 2024-07-29 LAB — URINALYSIS, W/ REFLEX TO CULTURE (INFECTION SUSPECTED)
Bacteria, UA: NONE SEEN
Bilirubin Urine: NEGATIVE
Glucose, UA: 150 mg/dL — AB
Ketones, ur: 20 mg/dL — AB
Leukocytes,Ua: NEGATIVE
Nitrite: NEGATIVE
Protein, ur: >=300 mg/dL — AB
Specific Gravity, Urine: 1.02 (ref 1.005–1.030)
pH: 5 (ref 5.0–8.0)

## 2024-07-29 LAB — CBC WITH DIFFERENTIAL/PLATELET
Abs Immature Granulocytes: 0.01 K/uL (ref 0.00–0.07)
Basophils Absolute: 0.1 K/uL (ref 0.0–0.1)
Basophils Relative: 1 %
Eosinophils Absolute: 0.1 K/uL (ref 0.0–0.5)
Eosinophils Relative: 1 %
HCT: 37.5 % — ABNORMAL LOW (ref 39.0–52.0)
Hemoglobin: 12.4 g/dL — ABNORMAL LOW (ref 13.0–17.0)
Immature Granulocytes: 0 %
Lymphocytes Relative: 23 %
Lymphs Abs: 1.3 K/uL (ref 0.7–4.0)
MCH: 32.8 pg (ref 26.0–34.0)
MCHC: 33.1 g/dL (ref 30.0–36.0)
MCV: 99.2 fL (ref 80.0–100.0)
Monocytes Absolute: 0.5 K/uL (ref 0.1–1.0)
Monocytes Relative: 9 %
Neutro Abs: 3.7 K/uL (ref 1.7–7.7)
Neutrophils Relative %: 66 %
Platelets: 272 K/uL (ref 150–400)
RBC: 3.78 MIL/uL — ABNORMAL LOW (ref 4.22–5.81)
RDW: 14.6 % (ref 11.5–15.5)
WBC: 5.5 K/uL (ref 4.0–10.5)
nRBC: 0 % (ref 0.0–0.2)

## 2024-07-29 LAB — PROTIME-INR
INR: 0.9 (ref 0.8–1.2)
Prothrombin Time: 13.2 s (ref 11.4–15.2)

## 2024-07-29 LAB — LACTIC ACID, PLASMA: Lactic Acid, Venous: 1.4 mmol/L (ref 0.5–1.9)

## 2024-07-29 LAB — CBG MONITORING, ED: Glucose-Capillary: 168 mg/dL — ABNORMAL HIGH (ref 70–99)

## 2024-07-29 MED ORDER — ONDANSETRON HCL 4 MG/2ML IJ SOLN: 4.0000 mg | Freq: Four times a day (QID) | INTRAMUSCULAR | Status: DC | PRN

## 2024-07-29 MED ORDER — PRAVASTATIN SODIUM 20 MG PO TABS
20.0000 mg | ORAL_TABLET | Freq: Every day | ORAL | Status: DC
  Administered 2024-07-30 – 2024-07-31 (×2): 20 mg via ORAL
  Filled 2024-07-29 (×2): qty 1

## 2024-07-29 MED ORDER — IRBESARTAN 75 MG PO TABS
37.5000 mg | ORAL_TABLET | Freq: Every day | ORAL | Status: DC
  Administered 2024-07-30: 37.5 mg via ORAL
  Filled 2024-07-29: qty 1

## 2024-07-29 MED ORDER — SODIUM CHLORIDE 0.9% FLUSH
3.0000 mL | Freq: Two times a day (BID) | INTRAVENOUS | Status: DC
  Administered 2024-07-29 – 2024-07-31 (×4): 3 mL via INTRAVENOUS

## 2024-07-29 MED ORDER — INSULIN ASPART 100 UNIT/ML IJ SOLN
0.0000 [IU] | Freq: Three times a day (TID) | INTRAMUSCULAR | Status: DC
  Administered 2024-07-30: 3 [IU] via SUBCUTANEOUS
  Administered 2024-07-31: 1 [IU] via SUBCUTANEOUS
  Filled 2024-07-29: qty 3
  Filled 2024-07-29: qty 1

## 2024-07-29 MED ORDER — SODIUM CHLORIDE 0.9 % IV BOLUS
500.0000 mL | Freq: Once | INTRAVENOUS | Status: AC
  Administered 2024-07-29: 500 mL via INTRAVENOUS

## 2024-07-29 MED ORDER — PREDNISOLONE ACETATE 1 % OP SUSP
1.0000 [drp] | Freq: Three times a day (TID) | OPHTHALMIC | Status: DC
  Administered 2024-07-29 – 2024-07-31 (×6): 1 [drp] via OPHTHALMIC
  Filled 2024-07-29: qty 5

## 2024-07-29 MED ORDER — ONDANSETRON HCL 4 MG PO TABS: 4.0000 mg | ORAL_TABLET | Freq: Four times a day (QID) | ORAL | Status: DC | PRN

## 2024-07-29 MED ORDER — INSULIN ASPART 100 UNIT/ML IJ SOLN
0.0000 [IU] | Freq: Every day | INTRAMUSCULAR | Status: DC
  Administered 2024-07-30: 3 [IU] via SUBCUTANEOUS
  Filled 2024-07-29: qty 3

## 2024-07-29 MED ORDER — GABAPENTIN 300 MG PO CAPS
300.0000 mg | ORAL_CAPSULE | Freq: Two times a day (BID) | ORAL | Status: DC
  Administered 2024-07-30 – 2024-07-31 (×4): 300 mg via ORAL
  Filled 2024-07-29 (×4): qty 1

## 2024-07-29 MED ORDER — ENOXAPARIN SODIUM 40 MG/0.4ML IJ SOSY
40.0000 mg | PREFILLED_SYRINGE | INTRAMUSCULAR | Status: DC
  Administered 2024-07-29 – 2024-07-30 (×2): 40 mg via SUBCUTANEOUS
  Filled 2024-07-29 (×2): qty 0.4

## 2024-07-29 MED ORDER — GABAPENTIN 300 MG PO CAPS: 300.0000 mg | ORAL_CAPSULE | Freq: Every day | ORAL | Status: DC

## 2024-07-29 MED ORDER — SENNOSIDES-DOCUSATE SODIUM 8.6-50 MG PO TABS: 1.0000 | ORAL_TABLET | Freq: Every evening | ORAL | Status: DC | PRN

## 2024-07-29 MED ORDER — GABAPENTIN 300 MG PO CAPS: 300.0000 mg | ORAL_CAPSULE | Freq: Every morning | ORAL | Status: DC

## 2024-07-29 MED ORDER — ACETAMINOPHEN 325 MG PO TABS: 650.0000 mg | ORAL_TABLET | Freq: Four times a day (QID) | ORAL | Status: DC | PRN

## 2024-07-29 MED ORDER — ACETAMINOPHEN 650 MG RE SUPP: 650.0000 mg | Freq: Four times a day (QID) | RECTAL | Status: DC | PRN

## 2024-07-29 MED ORDER — FENTANYL CITRATE (PF) 50 MCG/ML IJ SOSY
25.0000 ug | PREFILLED_SYRINGE | Freq: Once | INTRAMUSCULAR | Status: AC
  Administered 2024-07-29: 25 ug via INTRAVENOUS
  Filled 2024-07-29: qty 1

## 2024-07-29 MED ORDER — ASPIRIN 81 MG PO TBEC
81.0000 mg | DELAYED_RELEASE_TABLET | Freq: Every day | ORAL | Status: DC
  Administered 2024-07-30 – 2024-07-31 (×2): 81 mg via ORAL
  Filled 2024-07-29 (×2): qty 1

## 2024-07-29 MED ORDER — GABAPENTIN 300 MG PO CAPS
600.0000 mg | ORAL_CAPSULE | Freq: Every day | ORAL | Status: DC
  Administered 2024-07-29: 600 mg via ORAL
  Filled 2024-07-29: qty 2

## 2024-07-29 NOTE — Discharge Instructions (Addendum)
 Do not use cocaine, drink alcohol only in moderation.  Follow-up with your physician, return here for concerning changes in your condition.  Your urine sample did not show any signs of infection but there was some blood in your urine.  Follow-up with your primary care doctor to have that rechecked in the next week or so

## 2024-07-29 NOTE — Plan of Care (Signed)
Discuss and review plan of care with patient/family  

## 2024-07-29 NOTE — H&P (Signed)
 " History and Physical    Robert Sparks FMW:[id number] DOB: Jul 16, 1945 DOA: 07/29/2024  PCP: Vicci Barnie NOVAK, MD   Patient coming from: Home   Chief Complaint: Generalized weakness   HPI: Robert Sparks is a 79 y.o. male with medical history significant for hypertension, hyperlipidemia, type 2 diabetes mellitus, prostate cancer, and multiple myeloma with relapse who now presents for evaluation of generalized weakness.  Patient has too generally weak to ambulate and effectively care for himself for the past few days.  He has reportedly been staying in his car in front of a friend's home over this interval.  He denies headache, fever, chills, chest pain, or focal numbness or weakness.  He denies any preceding trauma or inciting event.  He reports occasional cocaine use including yesterday or today, and states that he has 1 or 2 beers most days but often goes several days without any alcohol.  ED Course: Upon arrival to the ED, patient is found to be afebrile and saturating well on room air with normal HR and stable BP.  Labs are most notable for creatinine 1.15, normal WBC, and normal lactic acid.  There are no acute findings on chest x-ray.  Patient was treated with IV fluids and fentanyl  in the ED.  He was initially planned for discharge home from the ED but was too weak to get up on his own.  Review of Systems:  All other systems reviewed and apart from HPI, are negative.  Past Medical History:  Diagnosis Date   Anemia    Arthritis    shoulders,feet    Cataract    per eye dr -has appt 5-11   CKD (chronic kidney disease), stage III (HCC)    Elevated PSA    History of ketoacidosis    03-29-2015    History of sepsis    07-25-2013  non-compliant w/ medication   Hyperlipidemia    Hypertension    Nocturia    Peripheral neuropathy    Prostate cancer (HCC)    Type 2 diabetes mellitus with insulin  therapy [facility])     Past Surgical History:  Procedure Laterality Date   CATARACT  EXTRACTION Bilateral 04/2024   Dr. Octavia   CIRCUMCISION  03/10/1989   PROSTATE BIOPSY N/A 12/21/2015   Procedure: BIOPSY TRANSRECTAL ULTRASONIC PROSTATE (TUBP);  Surgeon: Donnice Brooks, MD;  Location[facility];  Service: Urology;  Laterality: N/A;    Social History:   reports that he has been smoking cigarettes. He has a 16.5 pack-year smoking history. He has never used smokeless tobacco. He reports current alcohol use of about 1.0 standard drink of alcohol per week. He reports that he does not use drugs.  Allergies[1]  Family History  Problem Relation Age of Onset   Kidney disease Mother    Cancer Neg Hx    Colon cancer Neg Hx    Colon polyps Neg Hx    Esophageal cancer Neg Hx    Rectal cancer Neg Hx    Stomach cancer Neg Hx      Prior to Admission medications  Medication Sig Start Date End Date Taking? Authorizing Provider  Accu-Chek Softclix Lancets lancets Use to test blood sugar 3 times daily. 09/14/22   Vicci Barnie NOVAK, MD  acetaminophen  (TYLENOL ) 500 MG tablet Take 2 tablets (1,000 mg total) by mouth every 6 (six) hours as needed. 12/02/22   Enedelia Dorna HERO, FNP  acyclovir  (ZOVIRAX ) 400 MG tablet Take 1 tablet (400 mg total) by mouth  2 (two) times daily. 01/02/24   Onesimo Emaline Brink, MD  aspirin  EC 81 MG tablet Take 81 mg by mouth daily. Swallow whole.    [provider]  b complex vitamins capsule Take 1 capsule by mouth daily. 07/16/18   Kale, Gautam Kishore, MD  Blood Glucose Monitoring Suppl (ACCU-CHEK GUIDE) w/Device KIT Use to test blood sugar three times daily 09/14/22   Vicci Barnie NOVAK, MD  buPROPion  (WELLBUTRIN ) 75 MG tablet Take 1 tablet (75 mg total) by mouth 2 (two) times daily. To reduce urge of smoking. 07/20/23   Lovorn, Megan, MD  cetirizine  (ZYRTEC ) 5 MG tablet Take 1 tablet (5 mg total) by mouth at bedtime. 08/27/23   Rising, Asberry, PA-C  Continuous Glucose Receiver (FREESTYLE LIBRE 3 READER) DEVI Use as directed to check  blood sugars. 06/10/24   Vicci Barnie NOVAK, MD  Continuous Glucose Sensor (FREESTYLE LIBRE 3 PLUS SENSOR) MISC Change sensor every 15 days. 06/10/24   Vicci Barnie NOVAK, MD  donepezil  (ARICEPT ) 5 MG tablet TAKE 1 TABLET (5 MG TOTAL) BY MOUTH AT BEDTIME. FOR MEMORY ISSUES 08/29/21 07/15/24  Vicci Barnie NOVAK, MD  fluticasone  (FLONASE ) 50 MCG/ACT nasal spray Place 2 sprays into both nostrils daily. 08/27/23   Rising, Asberry, PA-C  gabapentin  (NEURONTIN ) 300 MG capsule Take 1 capsule (300 mg total) by mouth every morning AND 1 capsule (300 mg total) daily at 12 noon AND 2 capsules (600 mg total) at bedtime. Stop Lyrica . 02/20/24   Vicci Barnie NOVAK, MD  glucose blood (ACCU-CHEK GUIDE TEST) test strip Use to test blood sugar 3 times daily. 07/14/24   Vicci Barnie NOVAK, MD  Insulin  Lispro Prot & Lispro (HUMALOG  MIX 75/25 KWIKPEN) (75-25) 100 UNIT/ML Kwikpen Inject 9 units subcutaneously with breakfast and dinner. Increase to 12 units for 3-5 days twice a month after dexamethasone /decadron . 06/10/24   Vicci Barnie NOVAK, MD  Insulin  Pen Needle (INSUPEN PEN NEEDLES) 32G X 4 MM MISC USE AS DIRECTED TWICE DAILY WITH INSULIN  06/17/24   Vicci Barnie NOVAK, MD  ketorolac  (ACULAR ) 0.5 % ophthalmic solution Place 1 drop into the right eye in the morning, at noon, in the evening, and at bedtime. Start the day before surgery. 01/15/24     Multiple Vitamin (MULTIVITAMIN WITH MINERALS) TABS tablet Take 1 tablet by mouth daily. 11/12/16   Amin, Sumayya, MD  ofloxacin  (OCUFLOX ) 0.3 % ophthalmic solution Place 1 drop into the right eye 4 (four) times daily. Start one day before surgery. 01/15/24     ondansetron  (ZOFRAN ) 8 MG tablet Take 1 tablet (8 mg total) by mouth 2 (two) times daily as needed (Nausea or vomiting). 06/24/21   Onesimo Emaline Brink, MD  pomalidomide  (POMALYST ) 2 MG capsule Take 1 capsule (2 mg total) by mouth daily. Take 1 capsule (2 mg total) by mouth daily for 21 days. Take 7 days off. Repeat cycle. 07/15/24   Onesimo Emaline Brink, MD  potassium chloride  SA (KLOR-CON  M) 20 MEQ tablet Take 1 tablet (20 mEq total) by mouth daily. 02/28/24   Onesimo Emaline Brink, MD  pravastatin  (PRAVACHOL ) 20 MG tablet Take 1 tablet (20 mg total) by mouth daily. 05/13/24   Vicci Barnie NOVAK, MD  prednisoLONE  acetate (PRED FORTE ) 1 % ophthalmic suspension Place 1 drop into the left eye 3 (three) times daily. 04/14/24     prochlorperazine  (COMPAZINE ) 10 MG tablet Take 1 tablet (10 mg total) by mouth every 6 (six) hours as needed (Nausea or vomiting). 06/24/21   Kale, Gautam  Candida, MD  tiZANidine  (ZANAFLEX ) 4 MG tablet Take 1 tablet (4 mg total) by mouth daily as needed for muscle spasms. 07/23/24   Vicci Barnie NOVAK, MD  valsartan  (DIOVAN ) 40 MG tablet Take 1 tablet (40 mg total) by mouth daily. STOP LISINOPRIL  09/21/23   Vicci Barnie NOVAK, MD  Vitamin D , Ergocalciferol , (DRISDOL ) 1.25 MG (50000 UNIT) CAPS capsule Take 1 capsule (50,000 Units total) by mouth once a week. 12/18/23   Tobie Gordy POUR, MD    Physical Exam: Vitals:   07/29/24 1230 07/29/24 1403 07/29/24 1559 07/29/24 1844  BP: (!) 151/89  (!) 159/78 132/70  Pulse: 71  87 86  Resp: 18  18 18   Temp:  98.3 F (36.8 C) 98.3 F (36.8 C) 98.3 F (36.8 C)  SpO2: 100%  99% 99%     Constitutional: NAD, no pallor or diaphoresis   Eyes: PERTLA, lids and conjunctivae normal ENMT: Mucous membranes are moist. Posterior pharynx clear of any exudate or lesions.   Neck: supple, no masses  Respiratory: no wheezing, no crackles. No accessory muscle use.  Cardiovascular: S1 & S2 heard, regular rate and rhythm. No extremity edema.  Abdomen: No tenderness, soft. Bowel sounds active.  Musculoskeletal: no clubbing / cyanosis. No joint deformity upper and lower extremities.   Skin: no significant rashes, lesions, ulcers. Warm, dry, well-perfused. Neurologic: CN 2-12 grossly intact. Moving all extremities. Alert and oriented to person, place, and situation.  Psychiatric: Calm.  Cooperative.    Labs and Imaging on Admission: I have personally reviewed following labs and imaging studies  CBC: Recent Labs  Lab 07/29/24 1105  WBC 5.5  NEUTROABS 3.7  HGB 12.4*  HCT 37.5*  MCV 99.2  PLT 272   Basic Metabolic Panel: Recent Labs  Lab 07/29/24 1105  NA 143  K 3.7  CL 105  CO2 21*  GLUCOSE 126*  BUN 17  CREATININE 1.15  CALCIUM  9.8   GFR: Estimated Creatinine Clearance: 49.3 mL/min (by C-G formula based on SCr of 1.15 mg/dL). Liver Function Tests: Recent Labs  Lab 07/29/24 1105  AST 28  ALT 17  ALKPHOS 57  BILITOT 0.9  PROT 7.0  ALBUMIN 4.2   No results for input(s): LIPASE, AMYLASE in the last 168 hours. No results for input(s): AMMONIA in the last 168 hours. Coagulation Profile: Recent Labs  Lab 07/29/24 1105  INR 0.9   Cardiac Enzymes: No results for input(s): CKTOTAL, CKMB, CKMBINDEX, TROPONINI in the last 168 hours. BNP (last 3 results) No results for input(s): PROBNP in the last 8760 hours. HbA1C: No results for input(s): HGBA1C in the last 72 hours. CBG: No results for input(s): GLUCAP in the last 168 hours. Lipid Profile: No results for input(s): CHOL, HDL, LDLCALC, TRIG, CHOLHDL, LDLDIRECT in the last 72 hours. Thyroid  Function Tests: No results for input(s): TSH, T4TOTAL, FREET4, T3FREE, THYROIDAB in the last 72 hours. Anemia Panel: No results for input(s): VITAMINB12, FOLATE, FERRITIN, TIBC, IRON, RETICCTPCT in the last 72 hours. Urine analysis:    Component Value Date/Time   COLORURINE YELLOW 07/29/2024 1808   APPEARANCEUR HAZY (A) 07/29/2024 1808   LABSPEC 1.020 07/29/2024 1808   PHURINE 5.0 07/29/2024 1808   GLUCOSEU 150 (A) 07/29/2024 1808   HGBUR MODERATE (A) 07/29/2024 1808   BILIRUBINUR NEGATIVE 07/29/2024 1808   KETONESUR 20 (A) 07/29/2024 1808   PROTEINUR >=300 (A) 07/29/2024 1808   UROBILINOGEN 1.0 03/29/2015 1941   NITRITE NEGATIVE 07/29/2024 1808    LEUKOCYTESUR NEGATIVE 07/29/2024 1808  Sepsis Labs: @LABRCNTIP (procalcitonin:4,lacticidven:4) ) Recent Results (from the past 240 hours)  Blood Culture (routine x 2)     Status: None (Preliminary result)   Collection Time: 07/29/24 11:05 AM   Specimen: BLOOD LEFT FOREARM  Result Value Ref Range Status   Specimen Description   Final    BLOOD LEFT FOREARM Performed at [facility], [address], [place], [place] [address]    Special Requests   Final    BOTTLES DRAWN AEROBIC AND ANAEROBIC Blood Culture results may not be optimal due to an inadequate volume of blood received in culture bottles Performed at [facility], [address], [place], [place] [address]    Culture PENDING  Incomplete   Report Status PENDING  Incomplete     Radiological Exams on Admission: DG Chest Port 1 View Result Date: 07/29/2024 CLINICAL DATA:  Weakness.  Suspected sepsis. EXAM: PORTABLE CHEST 1 VIEW COMPARISON:  05/03/2018 FINDINGS: The heart size and mediastinal contours are within normal limits. Both lungs are clear. The visualized skeletal structures are unremarkable. IMPRESSION: No active disease. Electronically Signed   By: Norleen DELENA Kil M.D.   On: 07/29/2024 12:02    EKG: Independently reviewed. Sinus rhythm, non-specific IVCD.   Assessment/Plan   1. General weakness  - Patient had been living independently but now unable to get up on his own due to generalized weakness  - Check MRI brain, CK, B12, folate, TSH, and ammonia, hold AM and midday gabapentin  doses, consult PT   2. Hypertension  - Continue valsartan     3. Type II DM  - A1c was 6.8% in December 2025  - Check CBGs and use low-intensity SSI for now    4. CKD 3A  - Appears to be at baseline  - Renally-dose medications    5. Multiple myeloma  - Relapsed; followed by Dr. Onesimo and treated with daratumumab     DVT prophylaxis: Lovenox   Code Status: Full  Level of Care: Level of care: Telemetry Family  Communication: None present   Disposition Plan:  Patient is from: Home  Anticipated d/c is to: TBD Anticipated d/c date is: 1/21 or 07/31/24  Patient currently: Pending MRI brain, additional labs, PT eval  Consults called: None Admission status: Observation     Evalene GORMAN Sprinkles, MD [facility]  07/29/2024, 8:44 PM       [1]  Allergies Allergen Reactions   Other Other (See Comments)    Nicoderm CQ  = [place] Nicotine  gum = [place]    Lisinopril  Cough   "

## 2024-07-29 NOTE — ED Provider Notes (Signed)
 " [place] EMERGENCY DEPARTMENT AT [facility] Provider Note   CSN: [id number] Arrival date & time: 07/29/24  1028     Patient presents with: Weakness   Robert Sparks is a 79 y.o. male.   HPI Patient presents via ambulance.  He notes that he feels generally weak.  Patient notes he does have a domicile, but has been in his car for the last several days.  It is unclear why he has not been inside. Per report patient has prior therapy for multiple myeloma, multiple other medical problems, but is not currently receiving chemotherapy. He denies focal weakness, confusion, nausea, vomiting, fever.    Prior to Admission medications  Medication Sig Start Date End Date Taking? Authorizing Provider  Accu-Chek Softclix Lancets lancets Use to test blood sugar 3 times daily. 09/14/22   Vicci Barnie NOVAK, MD  acetaminophen  (TYLENOL ) 500 MG tablet Take 2 tablets (1,000 mg total) by mouth every 6 (six) hours as needed. 12/02/22   Enedelia Dorna HERO, FNP  acyclovir  (ZOVIRAX ) 400 MG tablet Take 1 tablet (400 mg total) by mouth 2 (two) times daily. 01/02/24   Onesimo Emaline Brink, MD  aspirin  EC 81 MG tablet Take 81 mg by mouth daily. Swallow whole.    [provider]  b complex vitamins capsule Take 1 capsule by mouth daily. 07/16/18   Kale, Gautam Kishore, MD  Blood Glucose Monitoring Suppl (ACCU-CHEK GUIDE) w/Device KIT Use to test blood sugar three times daily 09/14/22   Vicci Barnie NOVAK, MD  buPROPion  (WELLBUTRIN ) 75 MG tablet Take 1 tablet (75 mg total) by mouth 2 (two) times daily. To reduce urge of smoking. 07/20/23   Lovorn, Megan, MD  cetirizine  (ZYRTEC ) 5 MG tablet Take 1 tablet (5 mg total) by mouth at bedtime. 08/27/23   Rising, Asberry, PA-C  Continuous Glucose Receiver (FREESTYLE LIBRE 3 READER) DEVI Use as directed to check blood sugars. 06/10/24   Vicci Barnie NOVAK, MD  Continuous Glucose Sensor (FREESTYLE LIBRE 3 PLUS SENSOR) MISC Change sensor every 15 days. 06/10/24    Vicci Barnie NOVAK, MD  donepezil  (ARICEPT ) 5 MG tablet TAKE 1 TABLET (5 MG TOTAL) BY MOUTH AT BEDTIME. FOR MEMORY ISSUES 08/29/21 07/15/24  Vicci Barnie NOVAK, MD  fluticasone  (FLONASE ) 50 MCG/ACT nasal spray Place 2 sprays into both nostrils daily. 08/27/23   Rising, Asberry, PA-C  gabapentin  (NEURONTIN ) 300 MG capsule Take 1 capsule (300 mg total) by mouth every morning AND 1 capsule (300 mg total) daily at 12 noon AND 2 capsules (600 mg total) at bedtime. Stop Lyrica . 02/20/24   Vicci Barnie NOVAK, MD  glucose blood (ACCU-CHEK GUIDE TEST) test strip Use to test blood sugar 3 times daily. 07/14/24   Vicci Barnie NOVAK, MD  Insulin  Lispro Prot & Lispro (HUMALOG  MIX 75/25 KWIKPEN) (75-25) 100 UNIT/ML Kwikpen Inject 9 units subcutaneously with breakfast and dinner. Increase to 12 units for 3-5 days twice a month after dexamethasone /decadron . 06/10/24   Vicci Barnie NOVAK, MD  Insulin  Pen Needle (INSUPEN PEN NEEDLES) 32G X 4 MM MISC USE AS DIRECTED TWICE DAILY WITH INSULIN  06/17/24   Vicci Barnie NOVAK, MD  ketorolac  (ACULAR ) 0.5 % ophthalmic solution Place 1 drop into the right eye in the morning, at noon, in the evening, and at bedtime. Start the day before surgery. 01/15/24     Multiple Vitamin (MULTIVITAMIN WITH MINERALS) TABS tablet Take 1 tablet by mouth daily. 11/12/16   Amin, Sumayya, MD  ofloxacin  (OCUFLOX ) 0.3 % ophthalmic solution Place  1 drop into the right eye 4 (four) times daily. Start one day before surgery. 01/15/24     ondansetron  (ZOFRAN ) 8 MG tablet Take 1 tablet (8 mg total) by mouth 2 (two) times daily as needed (Nausea or vomiting). 06/24/21   Onesimo Emaline Brink, MD  pomalidomide  (POMALYST ) 2 MG capsule Take 1 capsule (2 mg total) by mouth daily. Take 1 capsule (2 mg total) by mouth daily for 21 days. Take 7 days off. Repeat cycle. 07/15/24   Onesimo Emaline Brink, MD  potassium chloride  SA (KLOR-CON  M) 20 MEQ tablet Take 1 tablet (20 mEq total) by mouth daily. 02/28/24   Onesimo Emaline Brink, MD   pravastatin  (PRAVACHOL ) 20 MG tablet Take 1 tablet (20 mg total) by mouth daily. 05/13/24   Vicci Barnie NOVAK, MD  prednisoLONE  acetate (PRED FORTE ) 1 % ophthalmic suspension Place 1 drop into the left eye 3 (three) times daily. 04/14/24     prochlorperazine  (COMPAZINE ) 10 MG tablet Take 1 tablet (10 mg total) by mouth every 6 (six) hours as needed (Nausea or vomiting). 06/24/21   Onesimo Emaline Brink, MD  tiZANidine  (ZANAFLEX ) 4 MG tablet Take 1 tablet (4 mg total) by mouth daily as needed for muscle spasms. 07/23/24   Vicci Barnie NOVAK, MD  valsartan  (DIOVAN ) 40 MG tablet Take 1 tablet (40 mg total) by mouth daily. STOP LISINOPRIL  09/21/23   Vicci Barnie NOVAK, MD  Vitamin D , Ergocalciferol , (DRISDOL ) 1.25 MG (50000 UNIT) CAPS capsule Take 1 capsule (50,000 Units total) by mouth once a week. 12/18/23   Tobie Gordy POUR, MD    Allergies: Other and Lisinopril     Review of Systems  Updated Vital Signs BP (!) 159/78   Pulse 87   Temp 98.3 F (36.8 C)   Resp 18   SpO2 99%   Physical Exam Vitals and nursing note reviewed.  Constitutional:      General: He is not in acute distress.    Appearance: He is well-developed.  HENT:     Head: Normocephalic and atraumatic.  Eyes:     Conjunctiva/sclera: Conjunctivae normal.  Cardiovascular:     Rate and Rhythm: Normal rate and regular rhythm.  Pulmonary:     Effort: Pulmonary effort is normal. No respiratory distress.     Breath sounds: No stridor.  Abdominal:     General: There is no distension.  Skin:    General: Skin is warm and dry.  Neurological:     Mental Status: He is alert and oriented to person, place, and time.     Motor: Atrophy present. No tremor or abnormal muscle tone.     Comments: Symmetric 4/5 strength bilateral lower extremities.  Face is symmetric, speech is clear.     (all labs ordered are listed, but only abnormal results are displayed) Labs Reviewed  COMPREHENSIVE METABOLIC PANEL WITH GFR - Abnormal; Notable for the  following components:      Result Value   CO2 21 (*)    Glucose, Bld 126 (*)    Anion gap 17 (*)    All other components within normal limits  CBC WITH DIFFERENTIAL/PLATELET - Abnormal; Notable for the following components:   RBC 3.78 (*)    Hemoglobin 12.4 (*)    HCT 37.5 (*)    All other components within normal limits  CULTURE, BLOOD (ROUTINE X 2)  CULTURE, BLOOD (ROUTINE X 2)  PROTIME-INR  LACTIC ACID, PLASMA  URINALYSIS, W/ REFLEX TO CULTURE (INFECTION SUSPECTED)    EKG: EKG  Interpretation Date/Time:  Tuesday July 29 2024 10:43:05 EST Ventricular Rate:  75 PR Interval:  46 QRS Duration:  148 QT Interval:  402 QTC Calculation: 452 R Axis:   64  Text Interpretation: Regular, narrow complex rhythm with substantial artifact Confirmed by Garrick Charleston [phone]) on 07/29/2024 12:33:36 PM  Radiology: [facility] Chest Port 1 View Result Date: 07/29/2024 CLINICAL DATA:  Weakness.  Suspected sepsis. EXAM: PORTABLE CHEST 1 VIEW COMPARISON:  05/03/2018 FINDINGS: The heart size and mediastinal contours are within normal limits. Both lungs are clear. The visualized skeletal structures are unremarkable. IMPRESSION: No active disease. Electronically Signed   By: Norleen DELENA Kil M.D.   On: 07/29/2024 12:02     Procedures   Medications Ordered in the ED  sodium chloride  0.9 % bolus 500 mL (500 mLs Intravenous New Bag/Given 07/29/24 1547)  fentaNYL  (SUBLIMAZE ) injection 25 mcg (25 mcg Intravenous Given 07/29/24 1547)                                    Medical Decision Making Elderly male with multiple myeloma presents after being outside for several days.  Patient has not notably hypothermic, 96.8, vital signs are reassuring, but prolonged cold exposure versus progression of multiple myeloma or other causes of weakness considered, including electrolytes, infection. Initial vitals reassuring cardiac 80 sinus normal pulse ox 100% room air normal  Amount and/or Complexity of Data  Reviewed External Data Reviewed: notes. Labs: ordered. Decision-making details documented in ED Course. Radiology: ordered and independent interpretation performed. Decision-making details documented in ED Course. ECG/medicine tests: ordered and independent interpretation performed. Decision-making details documented in ED Course.  Risk Prescription drug management. Decision regarding hospitalization. Diagnosis or treatment significantly limited by social determinants of health.   4:12 PM Patient has been ambulatory minimally, has had a bowel movement no urinalysis available.  Patient is receiving fluids, analgesics. On additional discussion patient noted to acknowledge cocaine and alcohol use within the past days, though he states that it was just a little bit. Pending urinalysis patient may be appropriate for discharge.  No lactic acidosis, no leukocytosis.  Patient did have mild anion gap and though there is no other obvious source of infection, urinalysis needed for this evaluation.  Otherwise no evidence for bacteremia, sepsis, cultures have been sent.   Final diagnoses:  Weakness    ED Discharge Orders     None          Garrick Charleston, MD 07/29/24 1715  "

## 2024-07-29 NOTE — ED Provider Notes (Addendum)
 Patient was initially seen by Dr. Garrick.  Please see his note.  Patient's urinalysis was pending at the time of shift change.  Patient's CBC and metabolic panel overall is reassuring.  Lactic acid level is normal.  Chest x-ray did not show pneumonia.  Urinalysis was pending at the time of shift change.  11-20 red blood cells noted but no signs of infection.  Will have patient follow-up with his PCP to have it rechecked   Attempted to have patient ambulate but he is very weak and unsteady.  Cannot completely exclude the possibility of an occult stroke causing his unsteady gait.  Could have been induced by his cocaine use.  Outside of any acute stroke treatment window.  MRI not available at this time.  I will consult medical service admission evaluation   Randol Simmonds, MD 07/29/24 [phone]

## 2024-07-29 NOTE — ED Triage Notes (Signed)
 Pt arrives via [facility]. Reportedly pt has been staying in his car in a friend's driveway for the last four days. Unclear why pt would not go inside. Pt denies injuries, just reports that he has been feeling more weak than usual. Denies recent illness.

## 2024-07-29 NOTE — ED Notes (Signed)
 Patient need 1-2 assistance to use the [facility] and moved back to the bed.

## 2024-07-30 ENCOUNTER — Observation Stay (HOSPITAL_COMMUNITY)

## 2024-07-30 DIAGNOSIS — R531 Weakness: Secondary | ICD-10-CM | POA: Diagnosis not present

## 2024-07-30 LAB — GLUCOSE, CAPILLARY
Glucose-Capillary: 138 mg/dL — ABNORMAL HIGH (ref 70–99)
Glucose-Capillary: 296 mg/dL — ABNORMAL HIGH (ref 70–99)
Glucose-Capillary: 148 mg/dL — ABNORMAL HIGH (ref 70–99)
Glucose-Capillary: 285 mg/dL — ABNORMAL HIGH (ref 70–99)

## 2024-07-30 LAB — URINE DRUG SCREEN
Amphetamines: NEGATIVE
Amphetamines: NEGATIVE
Barbiturates: NEGATIVE
Barbiturates: NEGATIVE
Benzodiazepines: NEGATIVE
Benzodiazepines: NEGATIVE
Cocaine: POSITIVE — AB
Cocaine: POSITIVE — AB
Fentanyl: POSITIVE — AB
Fentanyl: POSITIVE — AB
Methadone Scn, Ur: NEGATIVE
Methadone Scn, Ur: NEGATIVE
Opiates: NEGATIVE
Opiates: NEGATIVE
Tetrahydrocannabinol: POSITIVE — AB
Tetrahydrocannabinol: NEGATIVE

## 2024-07-30 LAB — TSH: TSH: 2.13 u[IU]/mL (ref 0.350–4.500)

## 2024-07-30 LAB — CBC
HCT: 30.2 % — ABNORMAL LOW (ref 39.0–52.0)
Hemoglobin: 10.3 g/dL — ABNORMAL LOW (ref 13.0–17.0)
MCH: 33.2 pg (ref 26.0–34.0)
MCHC: 34.1 g/dL (ref 30.0–36.0)
MCV: 97.4 fL (ref 80.0–100.0)
Platelets: 219 K/uL (ref 150–400)
RBC: 3.1 MIL/uL — ABNORMAL LOW (ref 4.22–5.81)
RDW: 14.6 % (ref 11.5–15.5)
WBC: 4.3 K/uL (ref 4.0–10.5)
nRBC: 0 % (ref 0.0–0.2)

## 2024-07-30 LAB — CK
Total CK: 210 U/L (ref 49–397)
Total CK: 155 U/L (ref 49–397)

## 2024-07-30 LAB — AMMONIA
Ammonia: 27 umol/L (ref 9–35)
Ammonia: 27 umol/L (ref 9–35)

## 2024-07-30 LAB — BASIC METABOLIC PANEL WITH GFR
Anion gap: 12 (ref 5–15)
BUN: 18 mg/dL (ref 8–23)
CO2: 25 mmol/L (ref 22–32)
Calcium: 8.9 mg/dL (ref 8.9–10.3)
Chloride: 106 mmol/L (ref 98–111)
Creatinine, Ser: 1.38 mg/dL — ABNORMAL HIGH (ref 0.61–1.24)
GFR, Estimated: 53 mL/min — ABNORMAL LOW
Glucose, Bld: 140 mg/dL — ABNORMAL HIGH (ref 70–99)
Potassium: 3.4 mmol/L — ABNORMAL LOW (ref 3.5–5.1)
Sodium: 143 mmol/L (ref 135–145)

## 2024-07-30 LAB — MAGNESIUM: Magnesium: 2 mg/dL (ref 1.7–2.4)

## 2024-07-30 LAB — ETHANOL: Alcohol, Ethyl (B): <15 mg/dL

## 2024-07-30 LAB — FOLATE: Folate: 15.2 ng/mL

## 2024-07-30 LAB — VITAMIN B12: Vitamin B-12: 2846 pg/mL — ABNORMAL HIGH (ref 180–914)

## 2024-07-30 MED ORDER — SODIUM CHLORIDE 0.9 % IV SOLN: INTRAVENOUS | Status: DC

## 2024-07-30 NOTE — Evaluation (Signed)
 Physical Therapy Evaluation Patient Details Name: Robert Sparks MRN: [id number] DOB: September 28, 1945 Today's Date: 07/30/2024  History of Present Illness  Pt is a 78y.o. male admitted on 07/29/24 for generalized weakness. Pt had been staying in his car for a couple days, reports unable to get out. PMH: HTN, HLD, T2DM, prostate cancer, and multiple myeloma.  Clinical Impression  PTA, pt reports being IND and living with a friend in a one level apartment with 1-2 steps to enter. Per admission, pt had been staying in his car for a couple days before admission, difficulty remembering why he was in his car but stated to PT I just couldn't get out. At time of evaluation, he requires CGA for all mobility and ambulates with RW for 139ft, intermittent cues for posture and proximity to device. See below for his current functional deficits. He would benefit from continued PT services following discharge with HHPT. PT will continue to follow pt while he is admitted to acute hospital.        If plan is discharge home, recommend the following: Assist for transportation;Assistance with cooking/housework   Can travel by private vehicle        Equipment Recommendations None recommended by PT  Recommendations for Other Services       Functional Status Assessment Patient has had a recent decline in their functional status and demonstrates the ability to make significant improvements in function in a reasonable and predictable amount of time.     Precautions / Restrictions Precautions Precautions: Fall Recall of Precautions/Restrictions: Intact Restrictions Weight Bearing Restrictions Per Provider Order: No      Mobility  Bed Mobility Overal bed mobility: Needs Assistance Bed Mobility: Supine to Sit, Sit to Supine     Supine to sit: Supervision Sit to supine: Supervision   General bed mobility comments: SPV for cues and body mechanics while transitioning to sit at EOB, increased time and effort  required for task    Transfers Overall transfer level: Needs assistance Equipment used: Rolling walker (2 wheels) Transfers: Sit to/from Stand Sit to Stand: Contact guard assist           General transfer comment: CGA from low bed, VC for proper hand placement    Ambulation/Gait Ambulation/Gait assistance: Contact guard assist Gait Distance (Feet): 150 Feet Assistive device: Rolling walker (2 wheels)   Gait velocity: decreased     General Gait Details: cues for improved upright posture and approriate proximity to DME, reports no increase in pain while ambulating, feels stable and no concerns for knees buckling  Stairs            Wheelchair Mobility     Tilt Bed    Modified Rankin (Stroke Patients Only)       Balance Overall balance assessment: Mild deficits observed, not formally tested                                           Pertinent Vitals/Pain Pain Assessment Pain Assessment: No/denies pain    Home Living Family/patient expects to be discharged to:: Private residence Living Arrangements: Non-relatives/Friends (friend) Available Help at Discharge: Friend(s);Available PRN/intermittently Type of Home: Apartment Home Access: Stairs to enter Entrance Stairs-Rails: None Entrance Stairs-Number of Steps: 1-2   Home Layout: One level Home Equipment: Agricultural Consultant (2 wheels);Shower seat      Prior Function Prior Level of Function : Independent/Modified Independent  Mobility Comments: pt reports being IND, does not use RW at baseline, reports no falls, does not drive or work. Enjoys being home and watching movies ADLs Comments: IND with all ADLs, shares responsibility for IADLs with roommate     Extremity/Trunk Assessment   Upper Extremity Assessment Upper Extremity Assessment: Overall WFL for tasks assessed;Defer to OT evaluation    Lower Extremity Assessment Lower Extremity Assessment: Generalized  weakness;RLE deficits/detail;LLE deficits/detail RLE Deficits / Details: chronic knee stiffness per pt report; wears knee sleeve for comfort and support RLE Sensation: WNL LLE Deficits / Details: chronic knee stiffness per pt report; wears knee sleeve for comfort and support LLE Sensation: WNL    Cervical / Trunk Assessment Cervical / Trunk Assessment: Kyphotic  Communication   Communication Communication: No apparent difficulties    Cognition Arousal: Alert Behavior During Therapy: WFL for tasks assessed/performed   PT - Cognitive impairments: No apparent impairments, No family/caregiver present to determine baseline                         Following commands: Intact       Cueing Cueing Techniques: Verbal cues, Visual cues     General Comments      Exercises     Assessment/Plan    PT Assessment Patient needs continued PT services  PT Problem List Decreased strength;Decreased activity tolerance;Decreased balance;Decreased mobility       PT Treatment Interventions Gait training;Functional mobility training;Therapeutic activities;Therapeutic exercise;Balance training    PT Goals (Current goals can be found in the Care Plan section)  Acute Rehab PT Goals Patient Stated Goal: get back to my home PT Goal Formulation: With patient Time For Goal Achievement: 08/13/24 Potential to Achieve Goals: Good    Frequency Min 2X/week     Co-evaluation               AM-PAC PT 6 Clicks Mobility  Outcome Measure Help needed turning from your back to your side while in a flat bed without using bedrails?: A Little Help needed moving from lying on your back to sitting on the side of a flat bed without using bedrails?: A Little Help needed moving to and from a bed to a chair (including a wheelchair)?: A Little Help needed standing up from a chair using your arms (e.g., wheelchair or bedside chair)?: A Little Help needed to walk in hospital room?: A Little Help  needed climbing 3-5 steps with a railing? : A Lot 6 Click Score: 17    End of Session Equipment Utilized During Treatment: Gait belt Activity Tolerance: Patient tolerated treatment well;Patient limited by fatigue Patient left: in bed;with call bell/phone within reach;with bed alarm set Nurse Communication: Mobility status PT Visit Diagnosis: Muscle weakness (generalized) (M62.81);Other abnormalities of gait and mobility (R26.89)    Time: 8588-8572 PT Time Calculation (min) (ACUTE ONLY): 16 min   Charges:   PT Evaluation $PT Eval Low Complexity: 1 Low PT Treatments $Gait Training: 8-22 mins           Isaiah DEL. Hamda Klutts, PT, DPT   [employer] 07/30/2024, 2:35 PM

## 2024-07-30 NOTE — Evaluation (Signed)
 Occupational Therapy Evaluation Patient Details Name: Robert Sparks MRN: [id number] DOB: 02-25-1946 Today's Date: 07/30/2024   History of Present Illness   Pt is a 79 yr old male admitted on 07/29/24 for generalized weakness. Pt had been staying in his car for a couple days, reports unable to get out. PMH: HTN, HLD, T2DM, prostate cancer, and multiple myeloma.     Clinical Impressions The pt is currently presenting slightly below his baseline level of functioning for ADL management. He is limited by the below listed deficits (see OT problem list). During the session today, he required CGA for sit to stand, toileting at bathroom level, and hand washing standing at the sink. He reported having chronic R knee and R shoulder discomfort. He will benefit from further OT services to maximize his independence with ADLs and to facilitate his safe return home. No post-acute OT needs anticipated.      If plan is discharge home, recommend the following:   Assistance with cooking/housework;Help with stairs or ramp for entrance;Assist for transportation     Functional Status Assessment   Patient has had a recent decline in their functional status and demonstrates the ability to make significant improvements in function in a reasonable and predictable amount of time.     Equipment Recommendations   None recommended by OT     Recommendations for Other Services         Precautions/Restrictions   Restrictions Weight Bearing Restrictions Per Provider Order: No     Mobility Bed Mobility   Bed Mobility: Sit to Supine       Sit to supine: Supervision        Transfers Overall transfer level: Needs assistance Equipment used: Rolling walker (2 wheels) Transfers: Sit to/from Stand Sit to Stand: Contact guard assist                  Balance Overall balance assessment: Mild deficits observed, not formally tested         ADL either performed or assessed with clinical  judgement   ADL Overall ADL's : Needs assistance/impaired Eating/Feeding: Independent;Sitting   Grooming: Contact guard assist;Standing Grooming Details (indicate cue type and reason): He performed hand washing standing at the sink with CGA. He further implemented the figure 4 technique to apply lotion to his feet in the chair.     Lower Body Bathing: Set up;Sitting/lateral leans   Upper Body Dressing : Set up;Sitting   Lower Body Dressing: Contact guard assist;Sitting/lateral leans;Sit to/from stand Lower Body Dressing Details (indicate cue type and reason): The pt implemented the figure 4 technique to doff then donn his socks at chair level. Toilet Transfer: Contact guard assist;Ambulation;Rolling walker (2 wheels);Grab bars Toilet Transfer Details (indicate cue type and reason): Pt performed at bathroom level. He required min cues for walker placement. Toileting- Clothing Manipulation and Hygiene: +2 for physical assistance;Sit to/from stand Toileting - Clothing Manipulation Details (indicate cue type and reason): Pt performed toileting management at bathroom level.             Vision   Additional Comments: He correctly read the time depicted on the wall clock.            Pertinent Vitals/Pain Pain Assessment Pain Assessment: 0-10 Pain Score: 3  Pain Location: R shoulder Pain Intervention(s): Limited activity within patient's tolerance, Monitored during session     Extremity/Trunk Assessment Upper Extremity Assessment Upper Extremity Assessment: Right hand dominant;RUE deficits/detail;LUE deficits/detail RUE Deficits / Details: Chronic shoulder AROM limitations, with  AROM for shoulder flexion being ~120 degrees. Elbow and hand AROM WFL. Functional grip strength LUE Deficits / Details: AROM and strength WFL   Lower Extremity Assessment Lower Extremity Assessment: RLE deficits/detail;LLE deficits/detail RLE Deficits / Details: chronic knee stiffness per pt report;  wears knee sleeve for comfort and support. AROM WFL RLE Sensation: WNL LLE Deficits / Details: chronic knee stiffness per pt report; wears knee sleeve for comfort and support. AROM WFL LLE Sensation: WNL     Communication Communication Communication: No apparent difficulties   Cognition Arousal: Alert Behavior During Therapy: WFL for tasks assessed/performed               OT - Cognition Comments: Oriented to person, place and month. He reported the year to be 1976.          Following commands: Intact       Cueing  General Comments   Cueing Techniques: Verbal cues              Home Living Family/patient expects to be discharged to:: Private residence Living Arrangements: Non-relatives/Friends (roommate) Available Help at Discharge: Friend(s);Available PRN/intermittently Type of Home: Apartment Home Access: Stairs to enter Entrance Stairs-Number of Steps: 1-2 Entrance Stairs-Rails: None Home Layout: One level     Bathroom Shower/Tub: Chief Strategy Officer: Standard Bathroom Accessibility: Yes   Home Equipment: Agricultural Consultant (2 wheels);Shower seat          Prior Functioning/Environment Prior Level of Function : Independent/Modified Independent;Driving             Mobility Comments:  (He reported being independent with ambulation.) ADLs Comments:  (Independent with ADLs and driving. He shared household chores with his roommate.)    OT Problem List: Decreased strength;Impaired balance (sitting and/or standing);Decreased knowledge of use of DME or AE;Pain   OT Treatment/Interventions: Self-care/ADL training;Therapeutic exercise;Energy conservation;DME and/or AE instruction;Therapeutic activities;Balance training;Patient/family education      OT Goals(Current goals can be found in the care plan section)   Acute Rehab OT Goals OT Goal Formulation: With patient Time For Goal Achievement: 08/13/24 Potential to Achieve Goals:  Good ADL Goals Pt Will Perform Grooming: with modified independence;standing Pt Will Perform Lower Body Dressing: with modified independence;sitting/lateral leans;sit to/from stand Pt Will Transfer to Toilet: with modified independence;ambulating;grab bars Pt Will Perform Toileting - Clothing Manipulation and hygiene: with modified independence;sit to/from stand   OT Frequency:  Min 2X/week       AM-PAC OT 6 Clicks Daily Activity     Outcome Measure Help from another person eating meals?: None Help from another person taking care of personal grooming?: A Little Help from another person toileting, which includes using toliet, bedpan, or urinal?: A Little Help from another person bathing (including washing, rinsing, drying)?: A Little Help from another person to put on and taking off regular upper body clothing?: A Little Help from another person to put on and taking off regular lower body clothing?: A Little 6 Click Score: 19   End of Session Equipment Utilized During Treatment: Gait belt;Rolling walker (2 wheels) Nurse Communication: Mobility status  Activity Tolerance: Patient tolerated treatment well Patient left: in bed;with call bell/phone within reach  OT Visit Diagnosis: Pain;Muscle weakness (generalized) (M62.81) Pain - Right/Left: Right Pain - part of body: Shoulder                Time: 8679-8652 OT Time Calculation (min): 27 min Charges:  OT General Charges $OT Visit: 1 Visit OT Evaluation $OT Eval Low  Complexity: 1 Low OT Treatments $Self Care/Home Management : 8-22 mins    Delanna JINNY Lesches, OTR/L 07/30/2024, 3:21 PM

## 2024-07-30 NOTE — Progress Notes (Signed)
..  SPIRITUAL CARE AND COUNSELING CONSULT NOTE   VISIT SUMMARY Patient refused AD at this time   SPIRITUAL ENCOUNTER                                                                                                                                                                      Care provided to:: Patient Referral source: Patient request Reason for visit: Advance directives OnCall Visit: No   SPIRITUAL FRAMEWORK      GOALS       INTERVENTIONS        INTERVENTION OUTCOMES      SPIRITUAL CARE PLAN        If immediate needs arise, please contact Darryle Law / Behavioral Health 24 hour on call [phone]   Jerona James  07/30/2024 2:30 PM

## 2024-07-30 NOTE — TOC Initial Note (Signed)
 Transition of Care [facility] - Initial/Assessment Note    Patient Details  Name: Robert Sparks MRN: [id number] Date of Birth: February 14, 1946  [facility] CM/SW Contact:    Heather DELENA Saltness, LCSW Phone Number: 07/30/2024, 12:53 PM  Clinical Narrative:                 Pt admitted to the hospital for generalized weakness. Pt currently living with friend, but reports he's been staying in his car in the driveway for the past few days. Pt has previous history of hypertension, hyperlipidemia, type 2 diabetes mellitus, prostate cancer, and multiple myeloma with relapse. PT evaluation pending. TOC will continue to follow.    Expected Discharge Plan: Home/Self Care Barriers to Discharge: Continued Medical Work up   Patient Goals and CMS Choice Patient states their goals for this hospitalization and ongoing recovery are:: To return home   Choice offered to / list presented to : NA [place] ownership interest in [facility].provided to:: Parent NA    Expected Discharge Plan and Services In-house Referral: Clinical Social Work Discharge Planning Services: NA Post Acute Care Choice: NA Living arrangements for the past 2 months: Apartment                 DME Arranged: N/A DME Agency: NA       HH Arranged: NA HH Agency: NA        Prior Living Arrangements/Services Living arrangements for the past 2 months: Apartment Lives with:: Self, Friends Patient language and need for interpreter reviewed:: Yes Do you feel safe going back to the place where you live?: Yes      Need for Family Participation in Patient Care: No (Comment) Care giver support system in place?: No (comment)   Criminal Activity/Legal Involvement Pertinent to Current Situation/Hospitalization: No - Comment as needed  Activities of Daily Living   ADL Screening (condition at time of admission) Independently performs ADLs?: No Does the patient have a NEW difficulty with  bathing/dressing/toileting/self-feeding that is expected to last >3 days?: Yes (Initiates electronic notice to provider for possible OT consult) Does the patient have a NEW difficulty with getting in/out of bed, walking, or climbing stairs that is expected to last >3 days?: Yes (Initiates electronic notice to provider for possible PT consult) Does the patient have a NEW difficulty with communication that is expected to last >3 days?: No Is the patient deaf or have difficulty hearing?: No Does the patient have difficulty seeing, even when wearing glasses/contacts?: No Does the patient have difficulty concentrating, remembering, or making decisions?: No  Permission Sought/Granted   Permission granted to share information with : No    Emotional Assessment Appearance:: Appears stated age Attitude/Demeanor/Rapport: Unable to Assess Affect (typically observed): Unable to Assess Orientation: : Oriented to Self, Oriented to Place, Oriented to  Time, Oriented to Situation Alcohol / Substance Use: Illicit Drugs Psych Involvement: No (comment)  Admission diagnosis:  Weakness [R53.1] General weakness [R53.1] Multiple myeloma in relapse (HCC) [C90.02] Type 2 diabetes mellitus with peripheral neuropathy (HCC) [E11.42] Hyperlipidemia associated with type 2 diabetes mellitus (HCC) [E11.69, E78.5] Hematuria, unspecified type [R31.9] Hypertension associated with type 2 diabetes mellitus (HCC) [E11.59, I15.2] Patient Active Problem List   Diagnosis Date Noted   General weakness 07/29/2024   Drug-induced polyneuropathy 09/07/2022   Pain in right shoulder 01/06/2022   Port-A-Cath in place 11/02/2021   Macroalbuminuric diabetic nephropathy (HCC) 08/31/2021   Multiple myeloma in relapse (HCC) 06/24/2021   Counseling regarding advance  care planning and goals of care 06/24/2021   Memory changes 04/25/2019   Hyperlipidemia associated with type 2 diabetes mellitus (HCC) 04/25/2019   Chronic bilateral low  back pain without sciatica 03/14/2019   Spinal stenosis, lumbar region with neurogenic claudication 03/14/2019   History of prostate cancer 12/20/2018   Neuropathy of right hand 12/20/2018   Primary osteoarthritis of right knee 01/07/2018   Diabetic polyneuropathy associated with type 2 diabetes mellitus (HCC) 01/07/2018   Vitamin B12 deficiency 09/04/2017   Cataract of both eyes 04/12/2017   Tobacco dependence 04/12/2017   Multiple myeloma in remission (HCC) 02/23/2017   Hyperlipidemia 06/21/2016   CKD (chronic kidney disease) stage 3, GFR 30-59 ml/min (HCC) 02/23/2016   Malignant neoplasm of prostate (HCC) 02/21/2016   Controlled type 2 diabetes mellitus with stage 3 chronic kidney disease, with long-term current use of insulin  (HCC) 03/29/2015   HTN (hypertension) 07/28/2013   Anemia, chronic disease 07/26/2013   ETOH abuse 07/25/2013   PCP:  Vicci Barnie NOVAK, MD Pharmacy:   [facility] - [facility] [address], [address] [place] [address] Phone: [phone] Fax: [phone]  [facility] [phone] - [place], [place] - [address] AT [facility] OF [address] [facility] [address] [place] [address] Phone: [phone] Fax: [phone]  [place] [facility] [address] [place] [address] Phone: [phone] Fax: [phone]  [facility] [place], [place] - [address] [facility] [address] [place] [address] Phone: [phone] Fax: [phone]  [id number] [place], [place] - [address] AT [facility] OF [address] [place] [address] Phone: [phone] Fax: [phone]     Social Drivers of Health (SDOH) Social History: SDOH Screenings   Food Insecurity: No Food Insecurity (07/29/2024)  Housing: Low Risk (07/29/2024)  Transportation Needs: No Transportation Needs (07/29/2024)  Utilities: Not  At Risk (07/29/2024)  Alcohol Screen: Low Risk (06/10/2024)  Depression (PHQ2-9): Low Risk (07/16/2024)  Financial Resource Strain: Low Risk (06/10/2024)  Physical Activity: Inactive (06/10/2024)  Social Connections: Unknown (07/29/2024)  Stress: No Stress Concern Present (06/10/2024)  Tobacco Use: High Risk (07/29/2024)  Health Literacy: Adequate Health Literacy (06/10/2024)   SDOH Interventions: None     Readmission Risk Interventions     No data to display          Signed: Heather Saltness, MSW, LCSW Clinical Social Worker Inpatient Care Management 07/30/2024 12:59 PM

## 2024-07-30 NOTE — Progress Notes (Addendum)
 " PROGRESS NOTE    Jarmaine Ehrler [place]  FMW:[id number] DOB: 02/05/1946 DOA: 07/29/2024 PCP: Vicci Barnie NOVAK, MD   Brief Narrative:  79 y.o. male with medical history significant for hypertension, hyperlipidemia, type 2 diabetes mellitus, prostate cancer, and multiple myeloma with relapse admitted with concern for generalized weakness unable to care for himself.  At baseline he is able to ambulate and per patient he works at [employer].  He does use cocaine occasionally and alcohol occasionally.  Unclear when was the last use.  He was admitted for further management.  Attempt was made to discharge patient home from the ED but he was too weak to walk.   Assessment & Plan:   Principal Problem:   General weakness Active Problems:   HTN (hypertension)   Controlled type 2 diabetes mellitus with stage 3 chronic kidney disease, with long-term current use of insulin  (HCC)   CKD (chronic kidney disease) stage 3, GFR 30-59 ml/min (HCC)   Multiple myeloma in remission (HCC)   1. General weakness patient tells me he lives in [place] but he now lives in [place] with a roommate.  Workup so far has been negative with negative MRI of the brain with no acute findings, B12 folate TSH normal, ammonia and CPK has been added to the labs.   Gabapentin  dose is decreased.  Check urine drug screen  Serum alcohol level. Consult physical therapy. Per patient he still works for [employer]   2. Hypertension  - Blood pressure soft with increasing creatinine hold Avapro    3. Type II DM  - A1c was 6.8% in December 2025  - Check CBGs and use low-intensity SSI for now     4. CKD 3A  - Appears to be at baseline  - Renally-dose medications     5. Multiple myeloma  - Relapsed; followed by Dr. Onesimo and treated with daratumumab     Estimated body mass index is 24.52 kg/m as calculated from the following:   Height as of this encounter: 5' 7 (1.702 m).   Weight as of this encounter: 71 kg.  DVT  prophylaxis:lovenox  Code Status: full Family Communication: none Disposition Plan:  Status is: Observation   Consultants: none  Procedures: none Antimicrobials: none  Subjective:  Awake  MRI . No acute intracranial abnormality. Small vessel disease, moderately advanced for age and most pronounced in the deep gray nuclei and pons. Tsh 2.130 K 3.4 Mag 2.0 Cr 1.38 Folate 15.2 B12 2846  Objective: Vitals:   07/29/24 2241 07/29/24 2246 07/30/24 0346 07/30/24 0747  BP:  (!) 142/76 133/69 116/64  Pulse:  79 77 75  Resp:  18 20 17   Temp:  97.8 F (36.6 C) 99 F (37.2 C) 98.6 F (37 C)  TempSrc:  Oral Oral Oral  SpO2:  100% 100%   Weight: 71 kg     Height: 5' 7 (1.702 m)       Intake/Output Summary (Last 24 hours) at 07/30/2024 1055 Last data filed at 07/30/2024 0600 Gross per 24 hour  Intake 740 ml  Output 125 ml  Net 615 ml   Filed Weights   07/29/24 2241  Weight: 71 kg    Examination:  General exam: Appears in no acute distress  respiratory system: Clear to auscultation. Respiratory effort normal. Cardiovascular system: Regular rate and rhythm  gastrointestinal system: Abdomen is nondistended, soft and nontender. No organomegaly or masses felt. Normal bowel sounds heard. Central nervous system: Alert and oriented. No focal neurological deficits. Extremities: No  edema  Data Reviewed: I have personally reviewed following labs and imaging studies  CBC: Recent Labs  Lab 07/29/24 1105 07/30/24 0129  WBC 5.5 4.3  NEUTROABS 3.7  --   HGB 12.4* 10.3*  HCT 37.5* 30.2*  MCV 99.2 97.4  PLT 272 219   Basic Metabolic Panel: Recent Labs  Lab 07/29/24 1105 07/30/24 0129  NA 143 143  K 3.7 3.4*  CL 105 106  CO2 21* 25  GLUCOSE 126* 140*  BUN 17 18  CREATININE 1.15 1.38*  CALCIUM  9.8 8.9  MG  --  2.0   GFR: Estimated Creatinine Clearance: 42.6 mL/min (A) (by C-G formula based on SCr of 1.38 mg/dL (H)). Liver Function Tests: Recent Labs  Lab  07/29/24 1105  AST 28  ALT 17  ALKPHOS 57  BILITOT 0.9  PROT 7.0  ALBUMIN 4.2   No results for input(s): LIPASE, AMYLASE in the last 168 hours. Recent Labs  Lab 07/30/24 0129  AMMONIA 27   Coagulation Profile: Recent Labs  Lab 07/29/24 1105  INR 0.9   Cardiac Enzymes: Recent Labs  Lab 07/30/24 0129  CKTOTAL 210   BNP (last 3 results) No results for input(s): PROBNP in the last 8760 hours. HbA1C: No results for input(s): HGBA1C in the last 72 hours. CBG: Recent Labs  Lab 07/29/24 2204 07/30/24 0748  GLUCAP 168* 138*   Lipid Profile: No results for input(s): CHOL, HDL, LDLCALC, TRIG, CHOLHDL, LDLDIRECT in the last 72 hours. Thyroid  Function Tests: Recent Labs    07/30/24 0129  TSH 2.130   Anemia Panel: Recent Labs    07/30/24 0129  VITAMINB12 2,846*  FOLATE 15.2   Sepsis Labs: Recent Labs  Lab 07/29/24 1105  LATICACIDVEN 1.4    Recent Results (from the past 240 hours)  Blood Culture (routine x 2)     Status: None (Preliminary result)   Collection Time: 07/29/24 11:05 AM   Specimen: BLOOD LEFT FOREARM  Result Value Ref Range Status   Specimen Description   Final    BLOOD LEFT FOREARM Performed at [facility], [address], [place], [place] [address]    Special Requests   Final    BOTTLES DRAWN AEROBIC AND ANAEROBIC Blood Culture results may not be optimal due to an inadequate volume of blood received in culture bottles Performed at [facility], [address], [place], [place] [address]    Culture   Final    NO GROWTH < 24 HOURS Performed at [facility], [address], [place], [place] [address]    Report Status PENDING  Incomplete  Blood Culture (routine x 2)     Status: None (Preliminary result)   Collection Time: 07/29/24 11:15 AM   Specimen: BLOOD  Result Value Ref Range Status   Specimen Description   Final    BLOOD RIGHT ANTECUBITAL Performed at [facility], [address], [place], [place] [address]    Special Requests   Final    BOTTLES DRAWN AEROBIC AND ANAEROBIC Blood Culture adequate volume Performed at [facility], [address], [place], [place] [address]    Culture   Final    NO GROWTH < 24 HOURS Performed at [facility], [address] [place], [place] [address]    Report Status PENDING  Incomplete         Radiology Studies: MR BRAIN WO CONTRAST Result Date: 07/30/2024 EXAM: MRI BRAIN WITHOUT CONTRAST 07/30/2024 07:33:51  AM TECHNIQUE: Multiplanar multisequence MRI of the head/brain was performed without the administration of intravenous contrast. COMPARISON: CT of the orbits dated 08/22/2018. CLINICAL HISTORY: 79 year old male hospitalized for mineralized weakness. FINDINGS: BRAIN AND VENTRICLES: Diminutive vertebrobasilar system appears to be on the basis of fetal type bilateral PCA (posterior cerebral artery) origins (normal variation). Some generalized brain volume loss, which might be advanced for age, but with no disproportionate regional brain atrophy identified. Ex vacuo appearing ventricular enlargement. Possible small 8 to 9 mm left vertex meningioma (series 15 image 12), which appears inconsequential. No intracranial mass effect or midline shift. Patchy periventricular white matter T2 and FLAIR hyperintensity. No cortical encephalomalacia identified. Moderate similar T2 and FLAIR heterogeneity in the bilateral pons with possible chronic microhemorrhage on the left (series 10 image 17). Superimposed multifocal generally small chronic lacunar infarcts in the bilateral deep gray nuclei, the largest is in the anterior left basal ganglia. ORBITS: Postoperative changes to both globes. SINUSES AND MASTOIDS: Scattered paranasal sinus mucosal thickening. Mastoids are well aerated. BONES AND SOFT TISSUES: Partially visible chronic cervical spine degeneration. IMPRESSION: 1. No acute intracranial  abnormality. 2. Small vessel disease, moderately advanced for age and most pronounced in the deep gray nuclei and pons. Electronically signed by: Helayne Hurst MD 07/30/2024 08:48 AM EST RP Workstation: [id number]   DG Chest Port 1 View Result Date: 07/29/2024 CLINICAL DATA:  Weakness.  Suspected sepsis. EXAM: PORTABLE CHEST 1 VIEW COMPARISON:  05/03/2018 FINDINGS: The heart size and mediastinal contours are within normal limits. Both lungs are clear. The visualized skeletal structures are unremarkable. IMPRESSION: No active disease. Electronically Signed   By: Norleen DELENA Kil M.D.   On: 07/29/2024 12:02     Scheduled Meds:  aspirin  EC  81 mg Oral Daily   enoxaparin  (LOVENOX ) injection  40 mg Subcutaneous Q24H   gabapentin   300 mg Oral BID WC   gabapentin   600 mg Oral QHS   insulin  aspart  0-5 Units Subcutaneous QHS   insulin  aspart  0-6 Units Subcutaneous TID WC   irbesartan   37.5 mg Oral Daily   pravastatin   20 mg Oral Daily   prednisoLONE  acetate  1 drop Left Eye TID   sodium chloride  flush  3 mL Intravenous Q12H   Continuous Infusions:   LOS: 0 days    Almarie KANDICE Hoots, MD 07/30/2024, 10:55 AM   "

## 2024-07-30 NOTE — Plan of Care (Signed)
" °  Problem: Coping: Goal: Ability to adjust to condition or change in health will improve Outcome: Progressing   Problem: Fluid Volume: Goal: Ability to maintain a balanced intake and output will improve Outcome: Progressing   Problem: Metabolic: Goal: Ability to maintain appropriate glucose levels will improve Outcome: Progressing   Problem: Nutritional: Goal: Maintenance of adequate nutrition will improve Outcome: Progressing   Problem: Skin Integrity: Goal: Risk for impaired skin integrity will decrease Outcome: Progressing   Problem: Clinical Measurements: Goal: Ability to maintain clinical measurements within normal limits will improve Outcome: Progressing   Problem: Activity: Goal: Risk for activity intolerance will decrease Outcome: Progressing   Problem: Coping: Goal: Level of anxiety will decrease Outcome: Progressing   "

## 2024-07-30 NOTE — Care Management Obs Status (Signed)
 MEDICARE OBSERVATION STATUS NOTIFICATION   Patient Details  Name: Robert Sparks MRN: [id number] Date of Birth: 09-Jan-1946   Medicare Observation Status Notification Given:  Yes    Heather DELENA Saltness, LCSW 07/30/2024, 2:40 PM

## 2024-07-31 ENCOUNTER — Other Ambulatory Visit: Payer: Self-pay

## 2024-07-31 ENCOUNTER — Other Ambulatory Visit (HOSPITAL_COMMUNITY): Payer: Self-pay

## 2024-07-31 DIAGNOSIS — R531 Weakness: Secondary | ICD-10-CM | POA: Diagnosis not present

## 2024-07-31 LAB — GLUCOSE, CAPILLARY
Glucose-Capillary: 153 mg/dL — ABNORMAL HIGH (ref 70–99)
Glucose-Capillary: 158 mg/dL — ABNORMAL HIGH (ref 70–99)
Glucose-Capillary: 247 mg/dL — ABNORMAL HIGH (ref 70–99)

## 2024-07-31 LAB — BASIC METABOLIC PANEL WITH GFR
Anion gap: 10 (ref 5–15)
BUN: 18 mg/dL (ref 8–23)
CO2: 24 mmol/L (ref 22–32)
Calcium: 8.9 mg/dL (ref 8.9–10.3)
Chloride: 106 mmol/L (ref 98–111)
Creatinine, Ser: 1.41 mg/dL — ABNORMAL HIGH (ref 0.61–1.24)
GFR, Estimated: 52 mL/min — ABNORMAL LOW
Glucose, Bld: 165 mg/dL — ABNORMAL HIGH (ref 70–99)
Potassium: 3.5 mmol/L (ref 3.5–5.1)
Sodium: 139 mmol/L (ref 135–145)

## 2024-07-31 MED ORDER — DONEPEZIL HCL 5 MG PO TABS
ORAL_TABLET | ORAL | 5 refills | Status: AC
  Filled 2024-07-31: qty 30, 30d supply, fill #0

## 2024-07-31 MED ORDER — PREDNISOLONE ACETATE 1 % OP SUSP
1.0000 [drp] | Freq: Three times a day (TID) | OPHTHALMIC | 0 refills | Status: AC
  Filled 2024-07-31: qty 5, 30d supply, fill #0

## 2024-07-31 MED ORDER — GABAPENTIN 300 MG PO CAPS
300.0000 mg | ORAL_CAPSULE | Freq: Two times a day (BID) | ORAL | 2 refills | Status: AC
  Filled 2024-07-31: qty 60, 30d supply, fill #0

## 2024-07-31 NOTE — Progress Notes (Signed)
 Discharge Medications delivered from TOC meds to bed [facility] outpatient pharmacy by this RN.

## 2024-07-31 NOTE — TOC Progression Note (Signed)
 Transition of Care [facility]) - Progression Note    Patient Details  Name: Robert Sparks MRN: [id number] Date of Birth: 1945-12-26  [facility] CM/SW Contact  Sonda Manuella Quill, RN Phone Number: 07/31/2024, 2:30 PM  Clinical Narrative:    [place] w/ pt in room; pt politely declined having this RN CM setup HHPT; pt also said he will d/c home to an apt that he shares w/ his friend.   Expected Discharge Plan: Home/Self Care Barriers to Discharge: Continued Medical Work up               Expected Discharge Plan and Services In-house Referral: Clinical Social Work Discharge Planning Services: NA Post Acute Care Choice: NA Living arrangements for the past 2 months: Apartment Expected Discharge Date: 07/31/24               DME Arranged: N/A DME Agency: NA       HH Arranged: NA HH Agency: NA         Social Drivers of Health (SDOH) Interventions SDOH Screenings   Food Insecurity: No Food Insecurity (07/29/2024)  Housing: Low Risk (07/29/2024)  Transportation Needs: No Transportation Needs (07/29/2024)  Utilities: Not At Risk (07/29/2024)  Alcohol Screen: Low Risk (06/10/2024)  Depression (PHQ2-9): Low Risk (07/16/2024)  Financial Resource Strain: Low Risk (06/10/2024)  Physical Activity: Inactive (06/10/2024)  Social Connections: Unknown (07/29/2024)  Stress: No Stress Concern Present (06/10/2024)  Tobacco Use: High Risk (07/29/2024)  Health Literacy: Adequate Health Literacy (06/10/2024)    Readmission Risk Interventions     No data to display

## 2024-07-31 NOTE — Plan of Care (Signed)
" °  Problem: Coping: Goal: Ability to adjust to condition or change in health will improve Outcome: Not Progressing   Problem: Fluid Volume: Goal: Ability to maintain a balanced intake and output will improve Outcome: Progressing   Problem: Health Behavior/Discharge Planning: Goal: Ability to identify and utilize available resources and services will improve Outcome: Progressing   Problem: Metabolic: Goal: Ability to maintain appropriate glucose levels will improve Outcome: Progressing   Problem: Nutritional: Goal: Maintenance of adequate nutrition will improve Outcome: Progressing Goal: Progress toward achieving an optimal weight will improve Outcome: Progressing   "

## 2024-07-31 NOTE — Discharge Summary (Addendum)
 Physician Discharge Summary  Robert Sparks FMW:[id number] DOB: 1946/04/05 DOA: 07/29/2024  PCP: Vicci Barnie NOVAK, MD  Admit date: 07/29/2024 Discharge date: 07/31/2024  Admitted From: home Disposition:  home  Recommendations for Outpatient Follow-up:  Follow up with PCP in 1-2 weeks Please obtain BMP/CBC in one week  Home Health:he refused Equipment/Devices:none Discharge Condition:stable CODE STATUS:full Diet recommendation:cardiac Brief/Interim Summary:  79 y.o. male with medical history significant for hypertension, hyperlipidemia, type 2 diabetes mellitus, prostate cancer, and multiple myeloma with relapse admitted with concern for generalized weakness unable to care for himself.  At baseline he is able to ambulate and per patient he works at [employer].  He does use cocaine occasionally and alcohol occasionally.  Unclear when was the last use.  He was admitted for further management.  Attempt was made to discharge patient home from the ED but he was too weak to walk.    Discharge Diagnoses:  Principal Problem:   General weakness Active Problems:   HTN (hypertension)   Controlled type 2 diabetes mellitus with stage 3 chronic kidney disease, with long-term current use of insulin  (HCC)   CKD (chronic kidney disease) stage 3, GFR 30-59 ml/min (HCC)   Multiple myeloma in remission (HCC)    1. General weakness patient tells me he lives in [place] but he now lives in [place] with a roommate.  Workup  with negative MRI of the brain with no acute findings, ammonia 27, folate 15.2, TSH 2.13  Urine drug screen positive for cocaine THC and fentanyl .   ?  If he got fentanyl  in the ED ?  Encouraged patient to quit substance abuse.  Patient was seen by physical therapy he ambulated around 150 feet and they recommended home health physical therapy which patient declined or refused.     2. Hypertension Avapro  was stopped on discharge due to soft to normal blood pressure 118/68.    3. Type II DM  - A1c was 6.8% in December 2025    4. CKD 3A  - Appears to be at baseline  - Renally-dose medications     5. Multiple myeloma  - Relapsed; followed by Dr. Onesimo and treated with daratumumab    6 polysubstance abuse urine drug screen was positive for cocaine THC and fentanyl  although unclear if he received fentanyl  this admission in the ED.  This could have been the main reason why he was weak.   Estimated body mass index is 24.52 kg/m as calculated from the following:   Height as of this encounter: 5' 7 (1.702 m).   Weight as of this encounter: 71 kg.  Discharge Instructions  Discharge Instructions     Increase activity slowly   Complete by: As directed       Allergies as of 07/31/2024       Reactions   Other Other (See Comments)   Nicoderm CQ  = Bad Dreams Nicotine  gum = Bad Dreams    Lisinopril  Cough        Medication List     STOP taking these medications    acyclovir  400 MG tablet Commonly known as: ZOVIRAX    buPROPion  75 MG tablet Commonly known as: WELLBUTRIN    cetirizine  5 MG tablet Commonly known as: ZYRTEC    ketorolac  0.5 % ophthalmic solution Commonly known as: ACULAR    ofloxacin  0.3 % ophthalmic solution Commonly known as: OCUFLOX    ondansetron  8 MG tablet Commonly known as: Zofran    potassium chloride  SA 20 MEQ tablet Commonly known as: KLOR-CON  M  prochlorperazine  10 MG tablet Commonly known as: COMPAZINE    tiZANidine  4 MG tablet Commonly known as: Zanaflex    valsartan  40 MG tablet Commonly known as: DIOVAN        TAKE these medications    Accu-Chek Guide Test test strip Generic drug: glucose blood Use to test blood sugar 3 times daily.   Accu-Chek Guide w/Device Kit Use to test blood sugar three times daily   Accu-Chek Softclix Lancets lancets Use to test blood sugar 3 times daily.   acetaminophen  500 MG tablet Commonly known as: TYLENOL  Take 2 tablets (1,000 mg total) by mouth every 6 (six) hours as  needed.   aspirin  EC 81 MG tablet Take 81 mg by mouth daily. Swallow whole.   b complex vitamins capsule Take 1 capsule by mouth daily.   donepezil  5 MG tablet Commonly known as: ARICEPT  TAKE 1 TABLET (5 MG TOTAL) BY MOUTH AT BEDTIME. FOR MEMORY ISSUES   fluticasone  50 MCG/ACT nasal spray Commonly known as: FLONASE  Place 2 sprays into both nostrils daily. What changed:  when to take this reasons to take this   FreeStyle Libre 3 Plus Sensor Misc Change sensor every 15 days.   FreeStyle Libre 3 Reader Babbitt Use as directed to check blood sugars.   gabapentin  300 MG capsule Commonly known as: NEURONTIN  Take 1 capsule (300 mg total) by mouth 2 (two) times daily with breakfast and lunch. Start taking on: August 01, 2024 What changed: See the new instructions.   HumaLOG  Mix 75/25 KwikPen (75-25) 100 UNIT/ML KwikPen Generic drug: Insulin  Lispro Prot & Lispro Inject 9 units subcutaneously with breakfast and dinner. Increase to 12 units for 3-5 days twice a month after dexamethasone /decadron .   Insupen Pen Needles 32G X 4 MM Misc Generic drug: Insulin  Pen Needle USE AS DIRECTED TWICE DAILY WITH INSULIN    multivitamin with minerals Tabs tablet Take 1 tablet by mouth daily.   pomalidomide  2 MG capsule Commonly known as: Pomalyst  Take 1 capsule (2 mg total) by mouth daily. Take 1 capsule (2 mg total) by mouth daily for 21 days. Take 7 days off. Repeat cycle.   pravastatin  20 MG tablet Commonly known as: PRAVACHOL  Take 1 tablet (20 mg total) by mouth daily.   prednisoLONE  acetate 1 % ophthalmic suspension Commonly known as: PRED FORTE  Place 1 drop into the left eye 3 (three) times daily.   Vitamin D  (Ergocalciferol ) 1.25 MG (50000 UNIT) Caps capsule Commonly known as: DRISDOL  Take 1 capsule (50,000 Units total) by mouth once a week.        Follow-up Information     Vicci Barnie NOVAK, MD. Call in 1 week(s).   Specialty: Internal Medicine Contact information: [address] [place] [address] [place] [address] [phone]                Allergies[1]  Consultations: None  Procedures/Studies: MR BRAIN WO CONTRAST Result Date: 07/30/2024 EXAM: MRI BRAIN WITHOUT CONTRAST 07/30/2024 07:33:51 AM TECHNIQUE: Multiplanar multisequence MRI of the head/brain was performed without the administration of intravenous contrast. COMPARISON: CT of the orbits dated 08/22/2018. CLINICAL HISTORY: 79 year old male hospitalized for mineralized weakness. FINDINGS: BRAIN AND VENTRICLES: Diminutive vertebrobasilar system appears to be on the basis of fetal type bilateral PCA (posterior cerebral artery) origins (normal variation). Some generalized brain volume loss, which might be advanced for age, but with no disproportionate regional brain atrophy identified. Ex vacuo appearing ventricular enlargement. Possible small 8 to 9 mm left vertex meningioma (series 15 image 12), which appears  inconsequential. No intracranial mass effect or midline shift. Patchy periventricular white matter T2 and FLAIR hyperintensity. No cortical encephalomalacia identified. Moderate similar T2 and FLAIR heterogeneity in the bilateral pons with possible chronic microhemorrhage on the left (series 10 image 17). Superimposed multifocal generally small chronic lacunar infarcts in the bilateral deep gray nuclei, the largest is in the anterior left basal ganglia. ORBITS: Postoperative changes to both globes. SINUSES AND MASTOIDS: Scattered paranasal sinus mucosal thickening. Mastoids are well aerated. BONES AND SOFT TISSUES: Partially visible chronic cervical spine degeneration. IMPRESSION: 1. No acute intracranial abnormality. 2. Small vessel disease, moderately advanced for age and most pronounced in the deep gray nuclei and pons. Electronically signed by: Helayne Hurst MD 07/30/2024 08:48 AM EST RP Workstation: [id number]   DG Chest Port 1 View Result Date: 07/29/2024 CLINICAL DATA:  Weakness.  Suspected  sepsis. EXAM: PORTABLE CHEST 1 VIEW COMPARISON:  05/03/2018 FINDINGS: The heart size and mediastinal contours are within normal limits. Both lungs are clear. The visualized skeletal structures are unremarkable. IMPRESSION: No active disease. Electronically Signed   By: Norleen DELENA Kil M.D.   On: 07/29/2024 12:02   (Echo, Carotid, EGD, Colonoscopy, ERCP)    Subjective:  Resting in bed ambulated with PT lives at home with his roommate Discharge Exam: Vitals:   07/31/24 0446 07/31/24 1215  BP: 131/66 118/68  Pulse: 66 77  Resp: 16 18  Temp: 98.6 F (37 C) 98.5 F (36.9 C)  SpO2: 98%    Vitals:   07/30/24 1400 07/30/24 1958 07/31/24 0446 07/31/24 1215  BP: 125/68 130/64 131/66 118/68  Pulse: 74 73 66 77  Resp:  18 16 18   Temp: 97.9 F (36.6 C) 98.5 F (36.9 C) 98.6 F (37 C) 98.5 F (36.9 C)  TempSrc: Oral     SpO2: 100% 95% 98%   Weight:      Height:        General: Pt is alert, awake, not in acute distress Cardiovascular: RRR, S1/S2 +, no rubs, no gallops Respiratory: CTA bilaterally, no wheezing, no rhonchi Abdominal: Soft, NT, ND, bowel sounds + Extremities: no edema, no cyanosis    The results of significant diagnostics from this hospitalization (including imaging, microbiology, ancillary and laboratory) are listed below for reference.     Microbiology: Recent Results (from the past 240 hours)  Blood Culture (routine x 2)     Status: None (Preliminary result)   Collection Time: 07/29/24 11:05 AM   Specimen: BLOOD LEFT FOREARM  Result Value Ref Range Status   Specimen Description   Final    BLOOD LEFT FOREARM Performed at [facility], [address], [place], [place] [address]    Special Requests   Final    BOTTLES DRAWN AEROBIC AND ANAEROBIC Blood Culture results may not be optimal due to an inadequate volume of blood received in culture bottles Performed at [facility], [address] [place], [place] [address]    Culture   Final     NO GROWTH 2 DAYS Performed at [facility], [address] [place], [place] [address]    Report Status PENDING  Incomplete  Blood Culture (routine x 2)     Status: None (Preliminary result)   Collection Time: 07/29/24 11:15 AM   Specimen: BLOOD  Result Value Ref Range Status   Specimen Description   Final    BLOOD RIGHT ANTECUBITAL Performed at [facility], [address] [place], [place] [address]  Special Requests   Final    BOTTLES DRAWN AEROBIC AND ANAEROBIC Blood Culture adequate volume Performed at [facility], [address], [place], [place] [address]    Culture   Final    NO GROWTH 2 DAYS Performed at [facility], [address], [place], [place] [address]    Report Status PENDING  Incomplete     Labs: BNP (last 3 results) No results for input(s): BNP in the last 8760 hours. Basic Metabolic Panel: Recent Labs  Lab 07/29/24 1105 07/30/24 0129 07/31/24 1003  NA 143 143 139  K 3.7 3.4* 3.5  CL 105 106 106  CO2 21* 25 24  GLUCOSE 126* 140* 165*  BUN 17 18 18   CREATININE 1.15 1.38* 1.41*  CALCIUM  9.8 8.9 8.9  MG  --  2.0  --    Liver Function Tests: Recent Labs  Lab 07/29/24 1105  AST 28  ALT 17  ALKPHOS 57  BILITOT 0.9  PROT 7.0  ALBUMIN 4.2   No results for input(s): LIPASE, AMYLASE in the last 168 hours. Recent Labs  Lab 07/30/24 0129 07/30/24 1230  AMMONIA 27 27   CBC: Recent Labs  Lab 07/29/24 1105 07/30/24 0129  WBC 5.5 4.3  NEUTROABS 3.7  --   HGB 12.4* 10.3*  HCT 37.5* 30.2*  MCV 99.2 97.4  PLT 272 219   Cardiac Enzymes: Recent Labs  Lab 07/30/24 0129 07/30/24 1230  CKTOTAL 210 155   BNP: Invalid input(s): POCBNP CBG: Recent Labs  Lab 07/30/24 1152 07/30/24 1645 07/30/24 2106 07/31/24 0809 07/31/24 1131  GLUCAP 296* 148* 285* 153* 158*   D-Dimer No results for input(s): DDIMER in the last 72 hours. Hgb A1c No results for input(s): HGBA1C in  the last 72 hours. Lipid Profile No results for input(s): CHOL, HDL, LDLCALC, TRIG, CHOLHDL, LDLDIRECT in the last 72 hours. Thyroid  function studies Recent Labs    07/30/24 0129  TSH 2.130   Anemia work up Recent Labs    07/30/24 0129  VITAMINB12 2,846*  FOLATE 15.2   Urinalysis    Component Value Date/Time   COLORURINE YELLOW 07/29/2024 1808   APPEARANCEUR HAZY (A) 07/29/2024 1808   LABSPEC 1.020 07/29/2024 1808   PHURINE 5.0 07/29/2024 1808   GLUCOSEU 150 (A) 07/29/2024 1808   HGBUR MODERATE (A) 07/29/2024 1808   BILIRUBINUR NEGATIVE 07/29/2024 1808   KETONESUR 20 (A) 07/29/2024 1808   PROTEINUR >=300 (A) 07/29/2024 1808   UROBILINOGEN 1.0 03/29/2015 1941   NITRITE NEGATIVE 07/29/2024 1808   LEUKOCYTESUR NEGATIVE 07/29/2024 1808   Sepsis Labs Recent Labs  Lab 07/29/24 1105 07/30/24 0129  WBC 5.5 4.3   Microbiology Recent Results (from the past 240 hours)  Blood Culture (routine x 2)     Status: None (Preliminary result)   Collection Time: 07/29/24 11:05 AM   Specimen: BLOOD LEFT FOREARM  Result Value Ref Range Status   Specimen Description   Final    BLOOD LEFT FOREARM Performed at [facility], [address] [place], [place] [address]    Special Requests   Final    BOTTLES DRAWN AEROBIC AND ANAEROBIC Blood Culture results may not be optimal due to an inadequate volume of blood received in culture bottles Performed at [facility], [address] [place], [place] [address]    Culture   Final    NO GROWTH 2 DAYS Performed at [facility], [address], [place], [place] [address]  Report Status PENDING  Incomplete  Blood Culture (routine x 2)     Status: None (Preliminary result)   Collection Time: 07/29/24 11:15 AM   Specimen: BLOOD  Result Value Ref Range Status   Specimen Description   Final    BLOOD RIGHT ANTECUBITAL Performed at [facility], [address], [place], [place]  [address]    Special Requests   Final    BOTTLES DRAWN AEROBIC AND ANAEROBIC Blood Culture adequate volume Performed at [facility], [address] [place], [place] [address]    Culture   Final    NO GROWTH 2 DAYS Performed at [facility], [address] [place], [place] [address]    Report Status PENDING  Incomplete     Time coordinating discharge: 40 min  SIGNED:   Almarie KANDICE Hoots, MD  [facility] 07/31/2024, 4:06 PM      [1]  Allergies Allergen Reactions   Other Other (See Comments)    Nicoderm CQ  = Bad Dreams Nicotine  gum = Bad Dreams    Lisinopril  Cough

## 2024-07-31 NOTE — TOC Transition Note (Addendum)
 Transition of Care [facility]) - Discharge Note   Patient Details  Name: Robert Sparks MRN: [id number] Date of Birth: [birth year]  Transition of [facility]) CM/SW Contact:  Sonda Manuella Quill, RN Phone Number: 07/31/2024, 3:42 PM   Clinical Narrative:    Notifiedpt needs transportation assistance; spoke w/ pt in room; pt said he cannot afford to pay for transportation; offered bus pass to pt but he declined; pt said he has not attempted to contact family/roommate for assistance; pt encouraged to make contact with them for assist  -[phone]- LVM for pt's POC Alfonso Ill [phone]); awaiting return call.  -[phone]- spoke w/ Delon Lesches, IP CM supervisor; she gave approval for taxi voucher; spoke w/ pt in room; pt said he has already contacted his roommate and he will provide transport; Ludie, RN notified; no IP CM needs.  Final next level of care: Home/Self Care Barriers to Discharge: No Barriers Identified   Patient Goals and CMS Choice Patient states their goals for this hospitalization and ongoing recovery are:: To return home   Choice offered to / list presented to : NA [place] ownership interest in [facility].provided to:: Parent NA    Discharge Placement                       Discharge Plan and Services Additional resources added to the After Visit Summary for   In-house Referral: Clinical Social Work Discharge Planning Services: NA Post Acute Care Choice: NA          DME Arranged: N/A DME Agency: NA       HH Arranged: Refused HH HH Agency: NA        Social Drivers of Health (SDOH) Interventions SDOH Screenings   Food Insecurity: No Food Insecurity (07/29/2024)  Housing: Low Risk (07/29/2024)  Transportation Needs: No Transportation Needs (07/29/2024)  Utilities: Not At Risk (07/29/2024)  Alcohol Screen: Low Risk (06/10/2024)  Depression (PHQ2-9): Low Risk (07/16/2024)  Financial Resource Strain: Low Risk (06/10/2024)  Physical Activity:  Inactive (06/10/2024)  Social Connections: Unknown (07/29/2024)  Stress: No Stress Concern Present (06/10/2024)  Tobacco Use: High Risk (07/29/2024)  Health Literacy: Adequate Health Literacy (06/10/2024)     Readmission Risk Interventions     No data to display

## 2024-08-01 ENCOUNTER — Other Ambulatory Visit: Payer: Self-pay

## 2024-08-03 LAB — CULTURE, BLOOD (ROUTINE X 2)
Culture: NO GROWTH
Culture: NO GROWTH
Special Requests: ADEQUATE

## 2024-08-04 ENCOUNTER — Other Ambulatory Visit: Payer: Self-pay

## 2024-08-05 ENCOUNTER — Telehealth: Payer: Self-pay

## 2024-08-05 NOTE — Transitions of Care (Post Inpatient/ED Visit) (Signed)
 "  08/05/2024  Name: Robert Sparks MRN: [id number] DOB: 12-Jan-1946  Today's TOC FU Call Status: Today's TOC FU Call Status:: Successful TOC FU Call Completed TOC FU Call Complete Date: 08/05/24  Patient's Name and Date of Birth confirmed. DOB, Name  Transition Care Management Follow-up Telephone Call Date of Discharge: 07/31/24 Discharge Facility: [facility]) Type of Discharge: Inpatient Admission Primary Inpatient Discharge Diagnosis:: general weakness How have you been since you were released from the hospital?: Better (He stated he is feeling good.) Any questions or concerns?: No  Items Reviewed: Did you receive and understand the discharge instructions provided?: Yes Medications obtained,verified, and reconciled?: Yes (Medications Reviewed) (He has a standard glucometer; not a [employer]. I encourged him to review the AVS again and to make sure he is not  taking the medications that are listed to stop.) Any new allergies since your discharge?: No Dietary orders reviewed?: Yes Do you have support at home?: Yes Name of Support/Comfort Primary Source: He said he lives in an apartment with a roommate, he is no longer staying in a car.  Medications Reviewed Today: Medications Reviewed Today     Reviewed by Marvis Bradley, RN (Case Manager) on 08/05/24 at 1117  Med List Status: <None>   Medication Order Taking? Sig Documenting Provider Last Dose Status Informant  Accu-Chek Softclix Lancets lancets [id number]  Use to test blood sugar 3 times daily. Vicci Barnie NOVAK, MD  Active Self  acetaminophen  (TYLENOL ) 500 MG tablet [id number]  Take 2 tablets (1,000 mg total) by mouth every 6 (six) hours as needed. Enedelia Dorna HERO, FNP  Active Self  aspirin  EC 81 MG tablet [id number]  Take 81 mg by mouth daily. Swallow whole. [provider]  Active Self  b complex vitamins capsule [id number]  Take 1 capsule by mouth daily. Onesimo Emaline Brink, MD  Active Self  Blood  Glucose Monitoring Suppl (ACCU-CHEK GUIDE) w/Device PRESSLEY [id number]  Use to test blood sugar three times daily Vicci Barnie NOVAK, MD  Active Self  Continuous Glucose Receiver (FREESTYLE LIBRE 3 READER) DEVI [id number]  Use as directed to check blood sugars. Vicci Barnie NOVAK, MD  Active Self  Continuous Glucose Sensor (FREESTYLE LIBRE 3 PLUS SENSOR) [place] [id number]  Change sensor every 15 days. Vicci Barnie NOVAK, MD  Active Self  donepezil  (ARICEPT ) 5 MG tablet [id number]  TAKE 1 TABLET (5 MG TOTAL) BY MOUTH AT BEDTIME. FOR MEMORY ISSUES Will Almarie MATSU, MD  Active   fluticasone  (FLONASE ) 50 MCG/ACT nasal spray [id number]  Place 2 sprays into both nostrils daily.  Patient taking differently: Place 2 sprays into both nostrils daily as needed.   Rising, Asberry, PA-C  Active Self  gabapentin  (NEURONTIN ) 300 MG capsule [id number]  Take 1 capsule (300 mg total) by mouth 2 (two) times daily with breakfast and lunch. Will Almarie MATSU, MD  Active   glucose blood (ACCU-CHEK GUIDE TEST) test strip [id number]  Use to test blood sugar 3 times daily. Vicci Barnie NOVAK, MD  Active Self  Insulin  Lispro Prot & Lispro (HUMALOG  MIX 75/25 KWIKPEN) (75-25) 100 UNIT/ML Kary [id number]  Inject 9 units subcutaneously with breakfast and dinner. Increase to 12 units for 3-5 days twice a month after dexamethasone /decadron . Vicci Barnie NOVAK, MD  Active Self  Insulin  Pen Needle (INSUPEN PEN NEEDLES) 32G X 4 MM MISC [id number]  USE AS DIRECTED TWICE DAILY WITH INSULIN  Vicci Barnie NOVAK, MD  Active Self  Multiple Vitamin (MULTIVITAMIN WITH MINERALS) TABS tablet [id number]  Take 1  tablet by mouth daily. Caleen Qualia, MD  Active Self  pomalidomide  (POMALYST ) 2 MG capsule [id number]  Take 1 capsule (2 mg total) by mouth daily. Take 1 capsule (2 mg total) by mouth daily for 21 days. Take 7 days off. Repeat cycle. Onesimo Emaline Brink, MD  Active Self  pravastatin  (PRAVACHOL ) 20 MG tablet [id number]  Take 1 tablet (20 mg total)  by mouth daily. Vicci Barnie NOVAK, MD  Active Self  prednisoLONE  acetate (PRED FORTE ) 1 % ophthalmic suspension [id number]  Place 1 drop into the left eye 3 (three) times daily. Will Almarie MATSU, MD  Active   Vitamin D , Ergocalciferol , (DRISDOL ) 1.25 MG (50000 UNIT) CAPS capsule [id number]  Take 1 capsule (50,000 Units total) by mouth once a week. Tobie Gordy POUR, MD  Active Self  Med List Note Carmella Asberry FALCON, [facility] 01/05/22 1158): Pomalyst  filled at [facility]            Home Care and Equipment/Supplies: Were Home Health Services Ordered?: No Any new equipment or medical supplies ordered?: No  Functional Questionnaire: Do you need assistance with bathing/showering or dressing?: No Do you need assistance with meal preparation?: No Do you need assistance with eating?: No Do you have difficulty maintaining continence: No Do you need assistance with getting out of bed/getting out of a chair/moving?: Yes (He said he has a cane) Do you have difficulty managing or taking your medications?: No  Follow up appointments reviewed: PCP Follow-up appointment confirmed?: Yes Date of PCP follow-up appointment?: 10/09/24 Follow-up Provider: Dr Vicci.  I offered to schedule him an appointment to be see sooner but he declined. He said he will call the clinic if he needs to be see sooner.  I also told him that we have a [facility] in the community and he can be seen there.  No appointment needed. He said he is aware of [facility]. Specialist Hospital Follow-up appointment confirmed?: Yes Date of Specialist follow-up appointment?: 08/13/24 Follow-Up Specialty Provider:: oncology Do you need transportation to your follow-up appointment?: No (He stated he plans to take the bus.) Do you understand care options if your condition(s) worsen?: Yes-patient verbalized understanding    SIGNATURE Slater Diesel, RN   "

## 2024-08-06 ENCOUNTER — Other Ambulatory Visit: Payer: Self-pay

## 2024-08-06 DIAGNOSIS — C9002 Multiple myeloma in relapse: Secondary | ICD-10-CM

## 2024-08-06 MED ORDER — POMALIDOMIDE 2 MG PO CAPS: 2.0000 mg | ORAL_CAPSULE | Freq: Every day | ORAL | 0 refills | Status: AC

## 2024-08-13 ENCOUNTER — Inpatient Hospital Stay: Admitting: Hematology

## 2024-08-13 ENCOUNTER — Inpatient Hospital Stay: Attending: Hematology

## 2024-08-13 ENCOUNTER — Inpatient Hospital Stay

## 2024-08-19 ENCOUNTER — Inpatient Hospital Stay: Admitting: Hematology

## 2024-08-19 ENCOUNTER — Inpatient Hospital Stay

## 2024-08-20 ENCOUNTER — Inpatient Hospital Stay

## 2024-09-10 ENCOUNTER — Inpatient Hospital Stay

## 2024-10-09 ENCOUNTER — Ambulatory Visit: Admitting: Internal Medicine

## 2024-10-13 ENCOUNTER — Ambulatory Visit: Admitting: Podiatry
